# Patient Record
Sex: Female | Born: 1940 | Race: White | Hispanic: No | Smoking: Current every day smoker
Health system: Southern US, Community
[De-identification: ages and names within clinical notes are randomized; demographics above are authoritative.]

## PROBLEM LIST (undated history)

## (undated) DIAGNOSIS — K08109 Complete loss of teeth, unspecified cause, unspecified class: Secondary | ICD-10-CM

## (undated) DIAGNOSIS — J439 Emphysema, unspecified: Secondary | ICD-10-CM

## (undated) DIAGNOSIS — F32A Depression, unspecified: Secondary | ICD-10-CM

## (undated) DIAGNOSIS — I5022 Chronic systolic (congestive) heart failure: Secondary | ICD-10-CM

## (undated) DIAGNOSIS — M5135 Other intervertebral disc degeneration, thoracolumbar region: Secondary | ICD-10-CM

## (undated) DIAGNOSIS — K219 Gastro-esophageal reflux disease without esophagitis: Secondary | ICD-10-CM

## (undated) DIAGNOSIS — C679 Malignant neoplasm of bladder, unspecified: Secondary | ICD-10-CM

## (undated) DIAGNOSIS — I491 Atrial premature depolarization: Secondary | ICD-10-CM

## (undated) DIAGNOSIS — Z95810 Presence of automatic (implantable) cardiac defibrillator: Secondary | ICD-10-CM

## (undated) DIAGNOSIS — R350 Frequency of micturition: Secondary | ICD-10-CM

## (undated) DIAGNOSIS — R2681 Unsteadiness on feet: Secondary | ICD-10-CM

## (undated) DIAGNOSIS — I2510 Atherosclerotic heart disease of native coronary artery without angina pectoris: Secondary | ICD-10-CM

## (undated) DIAGNOSIS — E871 Hypo-osmolality and hyponatremia: Secondary | ICD-10-CM

## (undated) DIAGNOSIS — F419 Anxiety disorder, unspecified: Secondary | ICD-10-CM

## (undated) DIAGNOSIS — I10 Essential (primary) hypertension: Secondary | ICD-10-CM

## (undated) DIAGNOSIS — Z9689 Presence of other specified functional implants: Secondary | ICD-10-CM

## (undated) DIAGNOSIS — M545 Low back pain: Secondary | ICD-10-CM

## (undated) DIAGNOSIS — I447 Left bundle-branch block, unspecified: Secondary | ICD-10-CM

## (undated) DIAGNOSIS — G8929 Other chronic pain: Secondary | ICD-10-CM

## (undated) DIAGNOSIS — Z973 Presence of spectacles and contact lenses: Secondary | ICD-10-CM

## (undated) DIAGNOSIS — Z972 Presence of dental prosthetic device (complete) (partial): Secondary | ICD-10-CM

## (undated) DIAGNOSIS — M419 Scoliosis, unspecified: Secondary | ICD-10-CM

## (undated) DIAGNOSIS — I428 Other cardiomyopathies: Secondary | ICD-10-CM

## (undated) DIAGNOSIS — Z862 Personal history of diseases of the blood and blood-forming organs and certain disorders involving the immune mechanism: Secondary | ICD-10-CM

## (undated) DIAGNOSIS — Z8719 Personal history of other diseases of the digestive system: Secondary | ICD-10-CM

## (undated) DIAGNOSIS — R351 Nocturia: Secondary | ICD-10-CM

## (undated) DIAGNOSIS — M1990 Unspecified osteoarthritis, unspecified site: Secondary | ICD-10-CM

## (undated) HISTORY — PX: BIV PACEMAKER GENERATOR CHANGE OUT: SHX5746

## (undated) HISTORY — PX: LUMBAR DISC SURGERY: SHX700

## (undated) HISTORY — PX: TRANSTHORACIC ECHOCARDIOGRAM: SHX275

## (undated) HISTORY — PX: SPINAL CORD STIMULATOR IMPLANT: SHX2422

## (undated) HISTORY — PX: CATARACT EXTRACTION W/ INTRAOCULAR LENS  IMPLANT, BILATERAL: SHX1307

## (undated) HISTORY — PX: CARDIAC CATHETERIZATION: SHX172

---

## 1993-03-21 HISTORY — PX: TRANSURETHRAL RESECTION OF BLADDER: SUR1395

## 1998-12-28 LAB — HM COLONOSCOPY

## 2010-08-31 ENCOUNTER — Emergency Department (HOSPITAL_BASED_OUTPATIENT_CLINIC_OR_DEPARTMENT_OTHER)
Admission: EM | Admit: 2010-08-31 | Discharge: 2010-08-31 | Disposition: A | Discharge status: Other Acute Inpatient Hospital | Payer: Medicare Other | Source: Home / Self Care | Attending: Emergency Medicine | Admitting: Emergency Medicine

## 2010-08-31 ENCOUNTER — Emergency Department (INDEPENDENT_AMBULATORY_CARE_PROVIDER_SITE_OTHER): Payer: Medicare Other

## 2010-08-31 ENCOUNTER — Inpatient Hospital Stay (HOSPITAL_COMMUNITY)
Admission: EM | Admit: 2010-08-31 | Discharge: 2010-09-02 | DRG: 690 | Disposition: A | Discharge status: Home / Self Care | Payer: Medicare Other | Source: Other Acute Inpatient Hospital | Attending: Internal Medicine | Admitting: Internal Medicine

## 2010-08-31 DIAGNOSIS — R0789 Other chest pain: Secondary | ICD-10-CM | POA: Diagnosis present

## 2010-08-31 DIAGNOSIS — Z7982 Long term (current) use of aspirin: Secondary | ICD-10-CM

## 2010-08-31 DIAGNOSIS — E876 Hypokalemia: Secondary | ICD-10-CM | POA: Diagnosis not present

## 2010-08-31 DIAGNOSIS — A498 Other bacterial infections of unspecified site: Secondary | ICD-10-CM | POA: Diagnosis present

## 2010-08-31 DIAGNOSIS — N390 Urinary tract infection, site not specified: Principal | ICD-10-CM | POA: Diagnosis present

## 2010-08-31 DIAGNOSIS — M549 Dorsalgia, unspecified: Secondary | ICD-10-CM | POA: Diagnosis present

## 2010-08-31 DIAGNOSIS — E871 Hypo-osmolality and hyponatremia: Secondary | ICD-10-CM | POA: Diagnosis present

## 2010-08-31 DIAGNOSIS — K2970 Gastritis, unspecified, without bleeding: Secondary | ICD-10-CM | POA: Diagnosis present

## 2010-08-31 DIAGNOSIS — R079 Chest pain, unspecified: Secondary | ICD-10-CM

## 2010-08-31 DIAGNOSIS — F17200 Nicotine dependence, unspecified, uncomplicated: Secondary | ICD-10-CM | POA: Diagnosis present

## 2010-08-31 DIAGNOSIS — R32 Unspecified urinary incontinence: Secondary | ICD-10-CM | POA: Diagnosis present

## 2010-08-31 DIAGNOSIS — R002 Palpitations: Secondary | ICD-10-CM

## 2010-08-31 DIAGNOSIS — R030 Elevated blood-pressure reading, without diagnosis of hypertension: Secondary | ICD-10-CM | POA: Diagnosis present

## 2010-08-31 DIAGNOSIS — E869 Volume depletion, unspecified: Secondary | ICD-10-CM | POA: Diagnosis present

## 2010-08-31 DIAGNOSIS — H6690 Otitis media, unspecified, unspecified ear: Secondary | ICD-10-CM | POA: Diagnosis present

## 2010-08-31 DIAGNOSIS — R112 Nausea with vomiting, unspecified: Secondary | ICD-10-CM

## 2010-08-31 LAB — DIFFERENTIAL
Basophils Absolute: 0 10*3/uL (ref 0.0–0.1)
Basophils Relative: 0 % (ref 0–1)
Eosinophils Relative: 1 % (ref 0–5)
Monocytes Absolute: 0.9 10*3/uL (ref 0.1–1.0)

## 2010-08-31 LAB — URINALYSIS, ROUTINE W REFLEX MICROSCOPIC
Ketones, ur: NEGATIVE mg/dL
Nitrite: POSITIVE — AB
Protein, ur: NEGATIVE mg/dL
Urobilinogen, UA: 0.2 mg/dL (ref 0.0–1.0)
pH: 6 (ref 5.0–8.0)

## 2010-08-31 LAB — COMPREHENSIVE METABOLIC PANEL
Albumin: 4 g/dL (ref 3.5–5.2)
Alkaline Phosphatase: 94 U/L (ref 39–117)
BUN: 7 mg/dL (ref 6–23)
Calcium: 9.9 mg/dL (ref 8.4–10.5)
Potassium: 3.6 mEq/L (ref 3.5–5.1)
Sodium: 126 mEq/L — ABNORMAL LOW (ref 135–145)
Total Protein: 7.1 g/dL (ref 6.0–8.3)

## 2010-08-31 LAB — CBC
HCT: 40.2 % (ref 36.0–46.0)
MCHC: 35.6 g/dL (ref 30.0–36.0)
RDW: 12.8 % (ref 11.5–15.5)

## 2010-08-31 LAB — CK TOTAL AND CKMB (NOT AT ARMC)
Relative Index: 3.8 — ABNORMAL HIGH (ref 0.0–2.5)
Total CK: 146 U/L (ref 7–177)

## 2010-08-31 LAB — CARDIAC PANEL(CRET KIN+CKTOT+MB+TROPI)
CK, MB: 4.7 ng/mL — ABNORMAL HIGH (ref 0.3–4.0)
Relative Index: 4.4 — ABNORMAL HIGH (ref 0.0–2.5)
Total CK: 107 U/L (ref 7–177)
Troponin I: <0.3 ng/mL (ref <0.30)

## 2010-08-31 LAB — TROPONIN I: Troponin I: <0.3 ng/mL (ref <0.30)

## 2010-08-31 LAB — MRSA PCR SCREENING: MRSA by PCR: NEGATIVE

## 2010-08-31 LAB — LIPID PANEL: LDL Cholesterol: 110 mg/dL — ABNORMAL HIGH (ref 0–99)

## 2010-08-31 LAB — LIPASE, BLOOD: Lipase: 16 U/L (ref 11–59)

## 2010-09-01 DIAGNOSIS — R072 Precordial pain: Secondary | ICD-10-CM

## 2010-09-01 LAB — CBC
Hemoglobin: 13.3 g/dL (ref 12.0–15.0)
Platelets: 270 10*3/uL (ref 150–400)
RBC: 4.37 MIL/uL (ref 3.87–5.11)

## 2010-09-01 LAB — CARDIAC PANEL(CRET KIN+CKTOT+MB+TROPI)
CK, MB: 4.4 ng/mL — ABNORMAL HIGH (ref 0.3–4.0)
Relative Index: INVALID (ref 0.0–2.5)
Total CK: 94 U/L (ref 7–177)
Troponin I: <0.3 ng/mL (ref <0.30)

## 2010-09-01 LAB — HEMOGLOBIN A1C
Hgb A1c MFr Bld: 5.7 % — ABNORMAL HIGH (ref <5.7)
Mean Plasma Glucose: 117 mg/dL — ABNORMAL HIGH (ref <117)

## 2010-09-01 LAB — BASIC METABOLIC PANEL
CO2: 26 mEq/L (ref 19–32)
Calcium: 8.8 mg/dL (ref 8.4–10.5)
Creatinine, Ser: 0.52 mg/dL (ref 0.4–1.2)
GFR calc non Af Amer: >60 mL/min (ref >60)
Glucose, Bld: 105 mg/dL — ABNORMAL HIGH (ref 70–99)
Sodium: 130 mEq/L — ABNORMAL LOW (ref 135–145)

## 2010-09-02 LAB — CBC
MCH: 30.6 pg (ref 26.0–34.0)
MCHC: 33.8 g/dL (ref 30.0–36.0)
Platelets: 233 10*3/uL (ref 150–400)
RDW: 13.5 % (ref 11.5–15.5)

## 2010-09-02 LAB — URINE CULTURE: Culture  Setup Time: 201206121823

## 2010-09-02 LAB — BASIC METABOLIC PANEL
Calcium: 8.8 mg/dL (ref 8.4–10.5)
GFR calc Af Amer: >60 mL/min (ref >60)
GFR calc non Af Amer: >60 mL/min (ref >60)
Potassium: 3.4 mEq/L — ABNORMAL LOW (ref 3.5–5.1)
Sodium: 138 mEq/L (ref 135–145)

## 2010-09-02 NOTE — H&P (Signed)
[name], VOLKOV                 ACCOUNT NO.:  [id number]  MEDICAL RECORD NO.:  [id number]  LOCATION:  2920                         FACILITY:  [facility]  PHYSICIAN:  Hartley Barefoot, MD    DATE OF BIRTH:  Mar 17, 1941  DATE OF ADMISSION:  08/31/2010 DATE OF DISCHARGE:                             HISTORY & PHYSICAL   CHIEF COMPLAINT:  Nausea, weakness and chest pain.  BRIEF HISTORY OF PRESENT ILLNESS:  This is a very pleasant 70 year old with no significant past medical history, she has not followed with her primary care physician for a while.  She presented to [facility] complaining of nausea, weakness and chest pain.  She relates that she had a cold a couple of weeks ago.  She relates that 4 days prior to admission she had a decreased appetite, nausea.  She started to have some chest pain on and off in the middle of her chest radiating to the left side.  It lasts 5-15 minutes.  Not associated with shortness of breath or diaphoresis.  It is a sharp pain.  It is not associated with exertion.  She denies any chest pain or shortness of breath on ambulation.  During my evaluation, she was chest pain free.  She was still having some nausea.  She relates that she vomited 3-4 times 4 days prior to admission, Her chest pain started after multiple episodes of vomiting She denies dysuria, although she has a history of incontinence for a while now.  ALLERGIES:  No known drug allergy.  MEDICATIONS: 1. Tylenol 2. Baby aspirin. 3. Motrin.  She took a couple of Motrin couple of days ago.  REVIEW OF SYSTEMS:  Negative except as per HPI.  PAST MEDICAL HISTORY:  She has history of chronic back pain.  She has not followed with any physician over the last couple of years.  SOCIAL HISTORY:  She is a current smoker.  She smoked one and half pack per day for the last 50 years.  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  She has 4 daughters and 2 sons.  She denies alcohol, recreational  drugs.  FAMILY HISTORY:  Sister died of heart disease at 13 years old.  No history of diabetes.  PHYSICAL EXAM:  VITAL SIGNS:  Temperature 98.4, respirations 12, sat 100 on 2 L, blood pressure 156/90. GENERAL:  The patient is lying in bed in no acute distress. HEENT:  Head atraumatic, normocephalic.  Eyes anicteric.  Pupils are equal and reactive to light.  Extraocular muscle intact. CARDIOVASCULAR:  S1 and S2.  Regular rhythm and rate.  No rubs, murmurs or gallops. LUNGS:  Bilateral good air movement.  No wheezing.  Mild crackles and rhonchi. ABDOMEN:  Bowel sounds positive, soft.  Mild epigastric tenderness.  No rigidity.  No guarding. EXTREMITIES:  Pulse present.  No edema. NEURO:  Alert and oriented x3.  Crania nerves II-XII intact.  Sensation grossly intact.  Motor strength 5/5 throughout.  LABORATORY DATA:  White blood cell 9.6, hemoglobin 14.3, platelet 285, ANC 6.9.  Sodium 126, potassium 3.6, chloride 91, BUN 7, CO2 23, glucose 125, creatinine 0.47, bilirubin 0.4, alkaline phosphatase 94, AST 24,  ALT 13, albumin 4.0, CK-MB 5.5, total CK is 140, troponin 0.30.  UA; positive nitrate, white blood cell 11-20.  Chest x-ray, no active lung disease, slight hyperaeration.  ASSESSMENT AND PLAN:  This is a very pleasant 70 year old that presents with chest pain and nausea. 1. Chest pain, rule out acute coronary syndrome.  EKG; PVC, no ST     elevation.  First set of troponin negative.  I will avoid at this     time beta-blockers because her heart rate is in the 60.  I will     start aspirin, nitroglycerin p.r.n., hydralazine to control blood     pressure.  The patient at this time is chest pain free.  I will     check a 2-D echo, cardiac enzymes x3.  Risk stratify, fasting lipid     panel, hemoglobin A1c.  We will start simvastatin.  We will just     check a lipase level.  Further recommendation depending on cardiac     enzymes, EKG and 2-D echo. 2. Nausea could be due to  gastritis, I will check a lipase to rule out     pancreatitis.  I will start Protonix.  Zofran p.r.n.  Nausea and     vomiting could be secondary to UTI, may be pyelo versus gastritis. 3. Hypertension.  Hydralazine p.r.n.  Monitor closely.  We will need     to start oral medication if blood pressure continued to be     elevated. 4. Urinary tract infection, questionable pyelo.  The patient presented     with nausea and vomiting.  No significant elevation of white blood     cell count.  She has some urinary incontinence that appears     chronic.  I will continue with ceftriaxone.  We will follow urine     cultures. 5. Hyponatremia in the setting of dehydration, poor oral intake,     nausea and vomiting.  We will continue with IV fluid, normal saline     100 mL per hour.  Repeat sodium in the morning. 6. Deep vein thrombosis.  SCDs at this time.     Hartley Barefoot, MD     BR/MEDQ  D:  08/31/2010  T:  09/01/2010  Job:  [id number]  Electronically Signed by Hartley Barefoot MD on 09/02/2010 08:15:11 PM

## 2010-09-06 LAB — CULTURE, BLOOD (ROUTINE X 2)
Culture  Setup Time: 201206122241
Culture: NO GROWTH

## 2010-09-15 NOTE — Discharge Summary (Signed)
NAMEAUDRYNA, [name]                 ACCOUNT NO.:  [id number]  MEDICAL RECORD NO.:  [id number]  LOCATION:  2018                         FACILITY:  [facility]  PHYSICIAN:  Kela Millin, M.D.DATE OF BIRTH:  [birth year]  DATE OF ADMISSION:  08/31/2010 DATE OF DISCHARGE:  09/02/2010                        DISCHARGE SUMMARY - REFERRING   DISCHARGE DIAGNOSIS: 1. Escherichia coli urinary tract infection. 2. Chest pain-rule out MI by cardiac enzymes, likely GI related. 3. Elevated blood pressures-on admission, but normotensive off of     medications during hospitalization, follow up outpatient. 4. Volume depletion/hyponatremia-resolved. 5. Tobacco abuse-patient counseled to quit.  PROCEDURES AND STUDIES: 1. Chest x-ray on August 31, 2010 - no active lung disease, slight     hyperaeration. 2  A 2-D echocardiogram on September 01, 2010 - ejection fraction 55%-60% wall motion normal.  No regional wall motion abnormalities.  No aortic valve stenosis, mild diastolic dysfunction.  BRIEF HISTORY:  The patient is a very pleasant 70 year old white female with no significant past medical history who presented with complaints of nausea, weakness, and chest pain.  She reported that about 4 days prior to admission she had decreased appetite and nausea and stated that she began having chest pain on and off midsternal/epigastric radiating to her left side and lasted for 5-15 minutes.  She denied shortness of breath and had no diaphoresis.  The pain was described as sharp and not associated with exertion.  She reported that she vomited 3-4 times in the 4 days prior to admission.  She denied dysuria, although she did admit that she had been having a urinary incontinence for sometime.  She was admitted for further evaluation and management.  HOSPITAL COURSE: 1. E. coli urinary tract infection-upon admission the patient had an     urinalysis which was consistent with UTI and urine cultures were     done and  the patient was started on empiric antibiotics.  The urine     cultures grew E. coli that was pansensitive.  The patient has not     had any nausea or vomiting, has remained afebrile and her last     white cell count prior to discharge 7.6.  She will be discharged on     oral antibiotics to complete the antibiotic course. 2. Chest pain-the patient had cardiac enzymes cycled and it came back     negative.  A 2-D echocardiogram was done and the results as stated     above.  The patient's chest pain resolved on PPI.  It was noted     that it was atypical and that she had been on NSAIDS prior to     admission and her symptoms were more likely GI related.  She has     been discharged on Prilosec.  She is to follow up with her primary     care physician for further monitoring and management as     appropriate.  As part of her cardiac workup the patient also had a     fasting lipid profile done and her total cholesterol came back at     175 and HDL of 52, LDL of 110.  She does  not require any further     Zocor at this time and is to follow up with her primary care     physician. 3. Elevated blood pressure-it was noted that one of the patient's     blood pressures in the ED was elevated at 161/89, on monitoring in     the hospital this elevation was not sustained and she has been     normotensive, and states that she did not have a prior history of     hypertension.  She is to follow up with her primary care physician     for further monitoring and management as appropriate. 4. Tobacco abuse-the patient was counseled to quit tobacco. 5. Volume depletion/hyponatremia-impression was that this was     secondary to her nausea and vomiting and with hydration this     resolved.  Her sodium today prior to discharge is 138. 6. Hypokalemia-Her potassium was replaced in the hospital.  DISCHARGE MEDICATIONS: 1. Ceftin 500 mg one p.o. b.i.d. 2. Prilosec 40 mg one p.o. b.i.d. 3. Aspirin 81 mg as  previously. 4. Tylenol p.r.n.  DISCONTINUED MEDICATIONS:  Motrin.  FOLLOWUP CARE:  Dr. Annitta Needs in 1-2 weeks, call for appointment.  DISCHARGE CONDITION:  Improved/stable.     Kela Millin, M.D.     ACV/MEDQ  D:  09/02/2010  T:  09/02/2010  Job:  [id number]  Electronically Signed by Donnalee Curry M.D. on 09/15/2010 07:28:36 AM

## 2013-11-19 DIAGNOSIS — Z8711 Personal history of peptic ulcer disease: Secondary | ICD-10-CM

## 2013-11-19 DIAGNOSIS — Z862 Personal history of diseases of the blood and blood-forming organs and certain disorders involving the immune mechanism: Secondary | ICD-10-CM

## 2013-11-19 HISTORY — DX: Personal history of diseases of the blood and blood-forming organs and certain disorders involving the immune mechanism: Z86.2

## 2013-11-19 HISTORY — DX: Personal history of peptic ulcer disease: Z87.11

## 2013-11-22 ENCOUNTER — Inpatient Hospital Stay (HOSPITAL_COMMUNITY)
Admission: EM | Admit: 2013-11-22 | Discharge: 2013-11-29 | DRG: 687 | Disposition: A | Discharge status: Home / Self Care | Payer: Medicare Other | Attending: Internal Medicine | Admitting: Internal Medicine

## 2013-11-22 ENCOUNTER — Encounter (HOSPITAL_COMMUNITY): Payer: Self-pay | Admitting: Emergency Medicine

## 2013-11-22 ENCOUNTER — Emergency Department (HOSPITAL_COMMUNITY): Payer: Medicare Other

## 2013-11-22 DIAGNOSIS — I4891 Unspecified atrial fibrillation: Secondary | ICD-10-CM | POA: Diagnosis present

## 2013-11-22 DIAGNOSIS — I499 Cardiac arrhythmia, unspecified: Secondary | ICD-10-CM | POA: Diagnosis not present

## 2013-11-22 DIAGNOSIS — R079 Chest pain, unspecified: Secondary | ICD-10-CM

## 2013-11-22 DIAGNOSIS — K2990 Gastroduodenitis, unspecified, without bleeding: Secondary | ICD-10-CM | POA: Diagnosis not present

## 2013-11-22 DIAGNOSIS — I447 Left bundle-branch block, unspecified: Secondary | ICD-10-CM | POA: Diagnosis present

## 2013-11-22 DIAGNOSIS — I4892 Unspecified atrial flutter: Secondary | ICD-10-CM | POA: Diagnosis not present

## 2013-11-22 DIAGNOSIS — D509 Iron deficiency anemia, unspecified: Secondary | ICD-10-CM | POA: Diagnosis present

## 2013-11-22 DIAGNOSIS — R112 Nausea with vomiting, unspecified: Secondary | ICD-10-CM | POA: Diagnosis not present

## 2013-11-22 DIAGNOSIS — Z681 Body mass index (BMI) 19 or less, adult: Secondary | ICD-10-CM | POA: Diagnosis not present

## 2013-11-22 DIAGNOSIS — Z01810 Encounter for preprocedural cardiovascular examination: Secondary | ICD-10-CM

## 2013-11-22 DIAGNOSIS — E871 Hypo-osmolality and hyponatremia: Secondary | ICD-10-CM

## 2013-11-22 DIAGNOSIS — Z8551 Personal history of malignant neoplasm of bladder: Secondary | ICD-10-CM | POA: Diagnosis not present

## 2013-11-22 DIAGNOSIS — Z8719 Personal history of other diseases of the digestive system: Secondary | ICD-10-CM | POA: Diagnosis present

## 2013-11-22 DIAGNOSIS — E861 Hypovolemia: Secondary | ICD-10-CM | POA: Diagnosis present

## 2013-11-22 DIAGNOSIS — R1013 Epigastric pain: Secondary | ICD-10-CM | POA: Diagnosis not present

## 2013-11-22 DIAGNOSIS — K259 Gastric ulcer, unspecified as acute or chronic, without hemorrhage or perforation: Secondary | ICD-10-CM | POA: Diagnosis present

## 2013-11-22 DIAGNOSIS — F17200 Nicotine dependence, unspecified, uncomplicated: Secondary | ICD-10-CM | POA: Diagnosis present

## 2013-11-22 DIAGNOSIS — R319 Hematuria, unspecified: Secondary | ICD-10-CM

## 2013-11-22 DIAGNOSIS — N390 Urinary tract infection, site not specified: Secondary | ICD-10-CM | POA: Diagnosis present

## 2013-11-22 DIAGNOSIS — R0609 Other forms of dyspnea: Secondary | ICD-10-CM | POA: Diagnosis not present

## 2013-11-22 DIAGNOSIS — I428 Other cardiomyopathies: Secondary | ICD-10-CM | POA: Diagnosis present

## 2013-11-22 DIAGNOSIS — R002 Palpitations: Secondary | ICD-10-CM

## 2013-11-22 DIAGNOSIS — M5137 Other intervertebral disc degeneration, lumbosacral region: Secondary | ICD-10-CM | POA: Diagnosis not present

## 2013-11-22 DIAGNOSIS — C679 Malignant neoplasm of bladder, unspecified: Principal | ICD-10-CM

## 2013-11-22 DIAGNOSIS — I452 Bifascicular block: Secondary | ICD-10-CM | POA: Diagnosis present

## 2013-11-22 DIAGNOSIS — K2970 Gastritis, unspecified, without bleeding: Secondary | ICD-10-CM | POA: Diagnosis not present

## 2013-11-22 DIAGNOSIS — R933 Abnormal findings on diagnostic imaging of other parts of digestive tract: Secondary | ICD-10-CM | POA: Diagnosis not present

## 2013-11-22 DIAGNOSIS — N329 Bladder disorder, unspecified: Secondary | ICD-10-CM | POA: Diagnosis not present

## 2013-11-22 DIAGNOSIS — I319 Disease of pericardium, unspecified: Secondary | ICD-10-CM | POA: Diagnosis not present

## 2013-11-22 DIAGNOSIS — I509 Heart failure, unspecified: Secondary | ICD-10-CM | POA: Diagnosis present

## 2013-11-22 DIAGNOSIS — R072 Precordial pain: Secondary | ICD-10-CM | POA: Diagnosis not present

## 2013-11-22 DIAGNOSIS — Z8711 Personal history of peptic ulcer disease: Secondary | ICD-10-CM | POA: Diagnosis present

## 2013-11-22 DIAGNOSIS — D62 Acute posthemorrhagic anemia: Secondary | ICD-10-CM | POA: Diagnosis present

## 2013-11-22 DIAGNOSIS — J449 Chronic obstructive pulmonary disease, unspecified: Secondary | ICD-10-CM | POA: Diagnosis not present

## 2013-11-22 DIAGNOSIS — D414 Neoplasm of uncertain behavior of bladder: Secondary | ICD-10-CM | POA: Diagnosis not present

## 2013-11-22 DIAGNOSIS — R109 Unspecified abdominal pain: Secondary | ICD-10-CM | POA: Diagnosis not present

## 2013-11-22 DIAGNOSIS — R310 Gross hematuria: Secondary | ICD-10-CM | POA: Diagnosis not present

## 2013-11-22 DIAGNOSIS — I491 Atrial premature depolarization: Secondary | ICD-10-CM

## 2013-11-22 DIAGNOSIS — I420 Dilated cardiomyopathy: Secondary | ICD-10-CM | POA: Diagnosis present

## 2013-11-22 DIAGNOSIS — K257 Chronic gastric ulcer without hemorrhage or perforation: Secondary | ICD-10-CM | POA: Diagnosis not present

## 2013-11-22 HISTORY — DX: Atrial premature depolarization: I49.1

## 2013-11-22 HISTORY — DX: Left bundle-branch block, unspecified: I44.7

## 2013-11-22 HISTORY — DX: Malignant neoplasm of bladder, unspecified: C67.9

## 2013-11-22 LAB — CBC
HEMATOCRIT: 28.7 % — AB (ref 36.0–46.0)
HEMOGLOBIN: 9.2 g/dL — AB (ref 12.0–15.0)
MCH: 20.4 pg — ABNORMAL LOW (ref 26.0–34.0)
MCHC: 32.1 g/dL (ref 30.0–36.0)
MCV: 63.6 fL — AB (ref 78.0–100.0)
Platelets: 322 10*3/uL (ref 150–400)
RBC: 4.51 MIL/uL (ref 3.87–5.11)
RDW: 17.8 % — ABNORMAL HIGH (ref 11.5–15.5)
WBC: 7.2 10*3/uL (ref 4.0–10.5)

## 2013-11-22 LAB — URINALYSIS, ROUTINE W REFLEX MICROSCOPIC
BILIRUBIN URINE: NEGATIVE
GLUCOSE, UA: NEGATIVE mg/dL
KETONES UR: NEGATIVE mg/dL
LEUKOCYTES UA: NEGATIVE
NITRITE: NEGATIVE
Protein, ur: NEGATIVE mg/dL
SPECIFIC GRAVITY, URINE: 1.004 — AB (ref 1.005–1.030)
Urobilinogen, UA: 0.2 mg/dL (ref 0.0–1.0)
pH: 7.5 (ref 5.0–8.0)

## 2013-11-22 LAB — COMPREHENSIVE METABOLIC PANEL
ALK PHOS: 63 U/L (ref 39–117)
ALT: 14 U/L (ref 0–35)
ANION GAP: 15 (ref 5–15)
AST: 26 U/L (ref 0–37)
Albumin: 3.7 g/dL (ref 3.5–5.2)
BILIRUBIN TOTAL: 0.4 mg/dL (ref 0.3–1.2)
BUN: 11 mg/dL (ref 6–23)
CHLORIDE: 83 meq/L — AB (ref 96–112)
CO2: 25 meq/L (ref 19–32)
CREATININE: 0.66 mg/dL (ref 0.50–1.10)
Calcium: 10 mg/dL (ref 8.4–10.5)
GFR calc Af Amer: >90 mL/min (ref >90)
GFR, EST NON AFRICAN AMERICAN: 85 mL/min — AB (ref >90)
GLUCOSE: 111 mg/dL — AB (ref 70–99)
POTASSIUM: 3.7 meq/L (ref 3.7–5.3)
Sodium: 123 mEq/L — ABNORMAL LOW (ref 137–147)
Total Protein: 6.9 g/dL (ref 6.0–8.3)

## 2013-11-22 LAB — POC OCCULT BLOOD, ED: FECAL OCCULT BLD: NEGATIVE

## 2013-11-22 LAB — PROTIME-INR
INR: 0.98 (ref 0.00–1.49)
Prothrombin Time: 13 seconds (ref 11.6–15.2)

## 2013-11-22 LAB — URINE MICROSCOPIC-ADD ON

## 2013-11-22 LAB — APTT: APTT: 27 s (ref 24–37)

## 2013-11-22 LAB — I-STAT TROPONIN, ED: TROPONIN I, POC: 0.02 ng/mL (ref 0.00–0.08)

## 2013-11-22 MED ORDER — ACETAMINOPHEN 325 MG PO TABS
650.0000 mg | ORAL_TABLET | Freq: Once | ORAL | Status: AC
  Administered 2013-11-22: 650 mg via ORAL
  Filled 2013-11-22: qty 2

## 2013-11-22 MED ORDER — SODIUM CHLORIDE 0.9 % IV SOLN
INTRAVENOUS | Status: DC
  Administered 2013-11-22: 17:00:00 via INTRAVENOUS

## 2013-11-22 MED ORDER — ONDANSETRON 4 MG PO TBDP
4.0000 mg | ORAL_TABLET | Freq: Once | ORAL | Status: AC
  Administered 2013-11-22: 4 mg via ORAL
  Filled 2013-11-22: qty 1

## 2013-11-22 MED ORDER — SUCRALFATE 1 GM/10ML PO SUSP
1.0000 g | Freq: Four times a day (QID) | ORAL | Status: DC
  Administered 2013-11-23 – 2013-11-29 (×20): 1 g via ORAL
  Filled 2013-11-22 (×31): qty 10

## 2013-11-22 MED ORDER — SODIUM CHLORIDE 0.9 % IV BOLUS (SEPSIS)
500.0000 mL | Freq: Once | INTRAVENOUS | Status: AC
  Administered 2013-11-22: 500 mL via INTRAVENOUS

## 2013-11-22 MED ORDER — GI COCKTAIL ~~LOC~~
30.0000 mL | Freq: Once | ORAL | Status: AC
  Administered 2013-11-22: 30 mL via ORAL
  Filled 2013-11-22: qty 30

## 2013-11-22 MED ORDER — NICOTINE 14 MG/24HR TD PT24
14.0000 mg | MEDICATED_PATCH | Freq: Every day | TRANSDERMAL | Status: DC
  Administered 2013-11-23 – 2013-11-29 (×8): 14 mg via TRANSDERMAL
  Filled 2013-11-22 (×8): qty 1

## 2013-11-22 MED ORDER — ASPIRIN 81 MG PO CHEW
324.0000 mg | CHEWABLE_TABLET | Freq: Once | ORAL | Status: AC
  Administered 2013-11-22: 324 mg via ORAL
  Filled 2013-11-22: qty 4

## 2013-11-22 NOTE — Consult Note (Signed)
CARDIOLOGY CONSULT NOTE   Patient ID: [name] MRN: [id number], DOB/AGE: 05-29-1940   Admit date: 11/22/2013 Date of Consult: 11/22/2013  Primary Physician: PROVIDER NOT Hugo Primary Cardiologist: None  Reason for consult:  Chest pain, arrhythmia  Problem List  Past Medical History  Diagnosis Date  . Cancer   . Bladder cancer 1995    Past Surgical History  Procedure Laterality Date  . Back surgery       Allergies  No Known Allergies  HPI   A pleasant 73 year old female with h/o bladder cancer, no prior cardiac history who presented with epigastric pain, nausea, vomiting , inability to eat in the last 2 days. Epigastric pain started about a week ago and was relieved by Rolaids, but got progressively worse. Today she felt really weak and dizzy and unable to keep anything down. She denies any chest pain, SOB, palpitations, LE edema, orthopnea or PND. No syncope. The patient is an ongoing smoker.  Inpatient Medications     Family History History reviewed. No pertinent family history.   Social History History   Social History  . Marital Status: Widowed    Spouse Name: N/A    Number of Children: N/A  . Years of Education: N/A   Occupational History  . Not on file.   Social History Main Topics  . Smoking status: Current Every Day Smoker -- 0.50 packs/day  . Smokeless tobacco: Not on file  . Alcohol Use: No  . Drug Use: Not on file  . Sexual Activity: Not on file   Other Topics Concern  . Not on file   Social History Narrative  . No narrative on file     Review of Systems  General:  No chills, fever, night sweats or weight changes.  Cardiovascular:  No chest pain, dyspnea on exertion, edema, orthopnea, palpitations, paroxysmal nocturnal dyspnea. Dermatological: No rash, lesions/masses Respiratory: No cough, dyspnea Urologic: No hematuria, dysuria Abdominal:   No nausea, vomiting, diarrhea, bright red blood per rectum, melena, or  hematemesis Neurologic:  No visual changes, wkns, changes in mental status. All other systems reviewed and are otherwise negative except as noted above.  Physical Exam  Blood pressure 136/91, pulse 87, temperature 98 F (36.7 C), temperature source Oral, resp. rate 20, SpO2 99.00%.  General: Pleasant, NAD Psych: Normal affect. Neuro: Alert and oriented X 3. Moves all extremities spontaneously. HEENT: Normal  Neck: Supple without bruits or JVD. Lungs:  Resp regular and unlabored, CTA. Heart: RRR no s3, s4, or murmurs. Abdomen: Soft, non-tender, non-distended, BS + x 4.  Extremities: No clubbing, cyanosis or edema. DP/PT/Radials 2+ and equal bilaterally.  Labs  No results found for this basename: CKTOTAL, CKMB, TROPONINI,  in the last 72 hours Lab Results  Component Value Date   WBC 7.2 11/22/2013   HGB 9.2* 11/22/2013   HCT 28.7* 11/22/2013   MCV 63.6* 11/22/2013   PLT 322 11/22/2013    Recent Labs Lab 11/22/13 1653  NA 123*  K 3.7  CL 83*  CO2 25  BUN 11  CREATININE 0.66  CALCIUM 10.0  PROT 6.9  BILITOT 0.4  ALKPHOS 63  ALT 14  AST 26  GLUCOSE 111*   Lab Results  Component Value Date   CHOL 175 08/31/2010   HDL 52 08/31/2010   LDLCALC 110* 08/31/2010   TRIG 67 08/31/2010   No results found for this basename: DDIMER   No components found with this basename: POCBNP,   Radiology/Studies  Dg Chest 2 View  11/22/2013   CLINICAL DATA:  Nausea and vomiting.  Sternal and epigastric pain.  EXAM: CHEST - 2 VIEW  COMPARISON:  08/31/2010  FINDINGS: The heart size and mediastinal contours are within normal limits. Stable COPD with mild bilateral pulmonary hyperinflation. There is no evidence of pulmonary edema, consolidation, pneumothorax, nodule or pleural fluid. The visualized skeletal structures are unremarkable.  IMPRESSION: Stable COPD.  No active disease.   Electronically Signed   By: Aletta Edouard M.D.   On: 11/22/2013 19:03    Echocardiogram - none  ECG: Sinus  tachycardia, short runs of atrial fibrillation, LBBB    ASSESSMENT AND PLAN  73 year old female  1. Atypical chest pain, new LBBB and frequent PACs and short runs of a-fib.  The pain is rather an epigastric pain in the settings of significant microcytic anemia. Anemia new when compared to 2012.   The patient also has significant hyponatremia that is most probably a consequence of no PO intake and vomiting in two days.   The patient needs rehydration and GI evaluation for possible upper GI bleeding.  I would restrain from any further ischemia work up at this point. Once her electrolyte disbalance and anemia are resolved and she continues to have BBB and ectopy we would consider outpatient stress test.  Based on urinalysis she also has UTI that might be contributing to her symptoms.   Signed, [name], MD, [facility] 11/22/2013, 9:55 PM

## 2013-11-22 NOTE — ED Provider Notes (Signed)
CSN: [id number]     Arrival date & time 11/22/13  1637 History   None    Chief Complaint  Patient presents with  . Chest Pain     (Consider location/radiation/quality/duration/timing/severity/associated sxs/prior Treatment) HPI Pt presenting with c/o generalized weakness and palpitations.  She states the symptoms have been ongoing for several days but worse today.  She also c/o nausea and one episode of emesis, nonbloody and nonbilious.  Denies chest pain but states she feels a pressure in her upper stomach.  No leg swelling.  No fainting or lightheadedness.  No difficulty breathing. Pt was treated with zofran by EMS.  There are no other associated systemic symptoms, there are no other alleviating or modifying factors.   Past Medical History  Diagnosis Date  . Cancer   . Bladder cancer 1995   Past Surgical History  Procedure Laterality Date  . Back surgery     History reviewed. No pertinent family history. History  Substance Use Topics  . Smoking status: Current Every Day Smoker -- 0.50 packs/day  . Smokeless tobacco: Not on file  . Alcohol Use: No   OB History   Grav Para Term Preterm Abortions TAB SAB Ect Mult Living                 Review of Systems ROS reviewed and all otherwise negative except for mentioned in HPI    Allergies  Review of patient's allergies indicates no known allergies.  Home Medications   Prior to Admission medications   Medication Sig Start Date End Date Taking? Authorizing Provider  acetaminophen (TYLENOL) 500 MG tablet Take 500 mg by mouth every 8 (eight) hours as needed for moderate pain.   Yes Historical Provider, MD  Ca Carbonate-Mag Hydroxide (ROLAIDS) 550-110 MG CHEW Chew 1 tablet by mouth 2 (two) times daily as needed (for heartburn).   Yes Historical Provider, MD   BP 136/91  Pulse 87  Temp(Src) 98 F (36.7 C) (Oral)  Resp 20  SpO2 99% Vitals reviewed Physical Exam Physical Examination: General appearance - alert, well  appearing, and in no distress Mental status - alert, oriented to person, place, and time Eyes - no conjunctival injection, no scleral icterus Mouth - mucous membranes moist, pharynx normal without lesions Chest - clear to auscultation, no wheezes, rales or rhonchi, symmetric air entry Heart - normal rate, regular rhythm, normal S1, S2, no murmurs, rubs, clicks or gallops Abdomen - soft, nontender, nondistended, no masses or organomegaly Extremities - peripheral pulses normal, no pedal edema, no clubbing or cyanosis Skin - normal coloration and turgor, no rashes  ED Course  Procedures (including critical care time)  4:48 PM paging interventional cardiology now.  EKG repeated x 2, new IVCD with left axis deviation.  Pt not having any chest pain but she does c/o nausea and palpiations.    4:51 PM d/w Dr. Martinique, he has reviewed EKG and agrees that this does not represent a stemi. Will proceed with workup in the ED  7:40 PM d/w cardiology, Dr. Meda Coffee, she will see patient in the ED.   8:02 PM cardiology has seen patient, recommends hospitalist admission, recommends UA, correction of electrolytes.  Will check hemocult due to microcytic anemia.  Cardiology recommends echo and they will consult.  Labs Review Labs Reviewed  CBC - Abnormal; Notable for the following:    Hemoglobin 9.2 (*)    HCT 28.7 (*)    MCV 63.6 (*)    MCH 20.4 (*)  RDW 17.8 (*)    All other components within normal limits  COMPREHENSIVE METABOLIC PANEL - Abnormal; Notable for the following:    Sodium 123 (*)    Chloride 83 (*)    Glucose, Bld 111 (*)    GFR calc non Af Amer 85 (*)    All other components within normal limits  URINALYSIS, ROUTINE W REFLEX MICROSCOPIC - Abnormal; Notable for the following:    Specific Gravity, Urine 1.004 (*)    Hgb urine dipstick MODERATE (*)    All other components within normal limits  URINE MICROSCOPIC-ADD ON - Abnormal; Notable for the following:    Squamous Epithelial / LPF  FEW (*)    Bacteria, UA FEW (*)    All other components within normal limits  APTT  PROTIME-INR  TROPONIN I  TROPONIN I  TROPONIN I  I-STAT TROPOININ, ED  POC OCCULT BLOOD, ED    Imaging Review Dg Chest 2 View  11/22/2013   CLINICAL DATA:  Nausea and vomiting.  Sternal and epigastric pain.  EXAM: CHEST - 2 VIEW  COMPARISON:  08/31/2010  FINDINGS: The heart size and mediastinal contours are within normal limits. Stable COPD with mild bilateral pulmonary hyperinflation. There is no evidence of pulmonary edema, consolidation, pneumothorax, nodule or pleural fluid. The visualized skeletal structures are unremarkable.  IMPRESSION: Stable COPD.  No active disease.   Electronically Signed   By: Aletta Edouard M.D.   On: 11/22/2013 19:03     EKG Interpretation   Date/Time:  Friday November 22 2013 16:45:28 EDT Ventricular Rate:  119 PR Interval:  168 QRS Duration: 138 QT Interval:  412 QTC Calculation: 580 R Axis:   -74 Text Interpretation:  Atrial flutter Nonspecific IVCD with LAD LVH with  secondary repolarization abnormality Anterior ST elevation, probably due  to LVH Probable RV involvement, suggest recording right precordial leads  Since previous tracing conduction delay and ectopy are new Confirmed by  [facility]  MD, Kendalyn Cranfield [phone]) on 11/22/2013 9:21:21 PM      MDM   Final diagnoses:  Hyponatremia  Chest pain, unspecified chest pain type  Epigastric pain  Microcytic anemia  Palpitations    Pt presenting with c/o palpitations, nausea.  Initial EKG showed ectopy and IVCD- these changes were new since prior in 2012- d/w interventionalist who reviewed EKG and agreed no stemi.  Cardiology consult obtained and recommends hospitalist admission for hyponatremia, anemia and feels her ectopy and conduction will improve when these are treated.  Cardiology also plans for echo.  D/w triad for admission to telemetry bed. Stool hemocult negative.   There are no other associated systemic  symptoms, there are no other alleviating or modifying factors.     Threasa Beards, MD 11/22/13 2245

## 2013-11-22 NOTE — H&P (Signed)
[facility] Admission History and Physical       [name] YNW:[id number] DOB: Oct 31, 1940 DOA: 11/22/2013  Referring physician:  EDP PCP: PROVIDER NOT IN SYSTEM  Specialists:   Chief Complaint: Nausea and Vomiting Chest and Epigastric Pain and Palpitations  HPI: [name] is a 73 y.o. female with a remote history of bladder cancer (1995) who presents to the ED with complaints of nausea and vomiting x 2 days with substernal chest pain and epigastric pain.  She denies any diarrhea, or hematemesis.   She reports that she has had palpitations a feeling as if her heart was pounding.   She reports that she had nausea and vomiting for 2 days last week and thought it was due to food poisoning.       Review of Systems:  Constitutional: No Weight Loss, No Weight Gain, Night Sweats, Fevers, Chills, Dizziness, Fatigue, or Generalized Weakness HEENT: No Headaches, Difficulty Swallowing,Tooth/Dental Problems,Sore Throat,  No Sneezing, Rhinitis, Ear Ache, Nasal Congestion, or Post Nasal Drip,  Cardio-vascular:  +Chest pain, Orthopnea, PND, Edema in Lower Extremities, Anasarca, Dizziness, +Palpitations  Resp: No Dyspnea, No DOE, No Productive Cough, No Non-Productive Cough, No Hemoptysis, No Wheezing.    GI: No Heartburn, Indigestion, +Epigastric Abdominal Pain, +Nausea, +Vomiting, No Diarrhea, Hematemesis, Hematochezia, Melena, Change in Bowel Habits,  Loss of Appetite  GU: No Dysuria, Change in Color of Urine, No Urgency or Frequency, No Flank pain.  Musculoskeletal: No Joint Pain or Swelling, No Decreased Range of Motion, +Back Pain.  Neurologic: No Syncope, No Seizures, Muscle Weakness, Paresthesia, Vision Disturbance or Loss, No Diplopia, No Vertigo, No Difficulty Walking,  Skin: No Rash or Lesions. Psych: No Change in Mood or Affect, No Depression or Anxiety, No Memory loss, No Confusion, or Hallucinations   Past Medical History  Diagnosis Date  . Cancer   . Bladder cancer 1995        Past Surgical History  Procedure Laterality Date  . Back surgery        Prior to Admission medications   Medication Sig Start Date End Date Taking? Authorizing Provider  acetaminophen (TYLENOL) 500 MG tablet Take 500 mg by mouth every 8 (eight) hours as needed for moderate pain.   Yes Historical Provider, MD  Ca Carbonate-Mag Hydroxide (ROLAIDS) 550-110 MG CHEW Chew 1 tablet by mouth 2 (two) times daily as needed (for heartburn).   Yes Historical Provider, MD     No Known Allergies   Social History:     +Tobacco:  1.5 ppd   No ETOH   No Illicit Drug Usage    History reviewed. No pertinent family history.     Physical Exam:  GEN:  Pleasant Thin Elderly  73 y.o. Caucasian female examined and in no acute distress; cooperative with exam Filed Vitals:   11/22/13 2030 11/22/13 2045 11/22/13 2100 11/22/13 2137  BP: 137/79 119/93 136/91   Pulse: 47 81 92 87  Temp:      TempSrc:      Resp: 18 16 19 20   SpO2: 100% 98% 99%    Blood pressure 136/91, pulse 87, temperature 98 F (36.7 C), temperature source Oral, resp. rate 20, SpO2 99.00%. PSYCH: She is alert and oriented x4; does not appear anxious does not appear depressed; affect is normal HEENT: Normocephalic and Atraumatic, Mucous membranes pink; PERRLA; EOM intact; Fundi:  Benign;  No scleral icterus, Nares: Patent, Oropharynx: Clear, Edentulous with Dentures present,    Neck:  FROM, No Cervical Lymphadenopathy nor  Thyromegaly or Carotid Bruit; No JVD; Breasts:: Not examined CHEST WALL: No tenderness CHEST: Normal respiration, clear to auscultation bilaterally HEART: Regular rate and rhythm; no murmurs rubs or gallops BACK: No kyphosis or scoliosis; No CVA tenderness ABDOMEN: Positive Bowel Sounds, Scaphoid, Soft Non-Tender; No Masses, No Organomegaly Rectal Exam: Not done EXTREMITIES: No Cyanosis, Clubbing, or Edema; No Ulcerations. Genitalia: not examined PULSES: 2+ and symmetric SKIN: Normal hydration no  rash or ulceration CNS:  Alert and Oriented x 4, No focal Deficits Vascular: pulses palpable throughout    Labs on Admission:  Basic Metabolic Panel:  Recent Labs Lab 11/22/13 1653  NA 123*  K 3.7  CL 83*  CO2 25  GLUCOSE 111*  BUN 11  CREATININE 0.66  CALCIUM 10.0   Liver Function Tests:  Recent Labs Lab 11/22/13 1653  AST 26  ALT 14  ALKPHOS 63  BILITOT 0.4  PROT 6.9  ALBUMIN 3.7   No results found for this basename: LIPASE, AMYLASE,  in the last 168 hours No results found for this basename: AMMONIA,  in the last 168 hours CBC:  Recent Labs Lab 11/22/13 1653  WBC 7.2  HGB 9.2*  HCT 28.7*  MCV 63.6*  PLT 322   Cardiac Enzymes: No results found for this basename: CKTOTAL, CKMB, CKMBINDEX, TROPONINI,  in the last 168 hours  BNP (last 3 results) No results found for this basename: PROBNP,  in the last 8760 hours CBG: No results found for this basename: GLUCAP,  in the last 168 hours  Radiological Exams on Admission: Dg Chest 2 View  11/22/2013   CLINICAL DATA:  Nausea and vomiting.  Sternal and epigastric pain.  EXAM: CHEST - 2 VIEW  COMPARISON:  08/31/2010  FINDINGS: The heart size and mediastinal contours are within normal limits. Stable COPD with mild bilateral pulmonary hyperinflation. There is no evidence of pulmonary edema, consolidation, pneumothorax, nodule or pleural fluid. The visualized skeletal structures are unremarkable.  IMPRESSION: Stable COPD.  No active disease.   Electronically Signed   By: Aletta Edouard M.D.   On: 11/22/2013 19:03     EKG: Independently reviewed. Sinus Tachycardia at 101, No Acute S-T changes   Assessment/Plan:   73 y.o. female with   Principal Problem:   1.   Hyponatremia- due to N+V +/- SIADH   Sent Urine Electrolytes, and Osms to eval for SIADH   IVFs with NSS to rehydrate   Monitor Na+Levels   Active Problems:   2.   Palpitations- due to #1   Telemetry Monitoring   Cycle Troponins   Seen By Cards in ED,  see Consult Note     3.   Chest pain   Telemetry Monitoring   Cycle Troponins     4.    Nausea and Vomiting- due to Esophagitis and/or Gastritis   Anti-Emetics PRN     5.   Epigastric pain- Possible Esophagitis or Gastritis or PUD   GI Cocktail x1 given in ED   Carafate PO QID started   May Need Outpatient Referral for GI Workup-     6.   Microcytic anemia-    Monitor Trend of H/Hs   Send Anemia panel   Refer for GI workup    7.   DVT Prophylaxis    Lovenox    Code Status:   FULL CODE  Family Communication:    Family at Bedside Disposition Plan:      Inpatient   Time spent:  [place]  C [facility] Pager [phone]   If [place] Please Contact the Day Rounding Team MD for [facility]  If 7PM-7AM, Please Contact night-coverage  [email or web address] Password [email or web address] 11/22/2013, 10:26 PM

## 2013-11-22 NOTE — ED Notes (Signed)
EMS brought patient from her home. States patient has been having nausea and vomiting since yesterday with generalized weakness.  Pt has been given zofran 4 mg iv. 20 G in left Forearm. Per EMS VS stable.

## 2013-11-22 NOTE — ED Notes (Signed)
Lauren RN obtained with Marya Amsler RN at bedside. Patient tolerated without incident.

## 2013-11-22 NOTE — ED Notes (Signed)
Admit Doctor at bedside.  

## 2013-11-22 NOTE — ED Notes (Signed)
MD at bedside. 

## 2013-11-22 NOTE — ED Notes (Addendum)
MD made aware of epigastric and back pain.

## 2013-11-23 ENCOUNTER — Inpatient Hospital Stay (HOSPITAL_COMMUNITY): Payer: Medicare Other

## 2013-11-23 ENCOUNTER — Encounter (HOSPITAL_COMMUNITY): Payer: Self-pay | Admitting: Internal Medicine

## 2013-11-23 LAB — URINALYSIS, ROUTINE W REFLEX MICROSCOPIC
BILIRUBIN URINE: NEGATIVE
Glucose, UA: NEGATIVE mg/dL
KETONES UR: NEGATIVE mg/dL
Nitrite: NEGATIVE
PH: 7 (ref 5.0–8.0)
PROTEIN: 100 mg/dL — AB
Specific Gravity, Urine: 1.005 (ref 1.005–1.030)
UROBILINOGEN UA: 0.2 mg/dL (ref 0.0–1.0)

## 2013-11-23 LAB — NA AND K (SODIUM & POTASSIUM), RAND UR
Potassium Urine: 11 mEq/L
Sodium, Ur: <20 mEq/L

## 2013-11-23 LAB — BASIC METABOLIC PANEL
ANION GAP: 12 (ref 5–15)
BUN: 12 mg/dL (ref 6–23)
CALCIUM: 9.1 mg/dL (ref 8.4–10.5)
CO2: 25 mEq/L (ref 19–32)
Chloride: 90 mEq/L — ABNORMAL LOW (ref 96–112)
Creatinine, Ser: 0.76 mg/dL (ref 0.50–1.10)
GFR, EST NON AFRICAN AMERICAN: 81 mL/min — AB (ref >90)
GLUCOSE: 105 mg/dL — AB (ref 70–99)
POTASSIUM: 3.8 meq/L (ref 3.7–5.3)
SODIUM: 127 meq/L — AB (ref 137–147)

## 2013-11-23 LAB — CBC
HCT: 29.2 % — ABNORMAL LOW (ref 36.0–46.0)
Hemoglobin: 9 g/dL — ABNORMAL LOW (ref 12.0–15.0)
MCH: 20.4 pg — ABNORMAL LOW (ref 26.0–34.0)
MCHC: 30.8 g/dL (ref 30.0–36.0)
MCV: 66.1 fL — AB (ref 78.0–100.0)
Platelets: 314 10*3/uL (ref 150–400)
RBC: 4.42 MIL/uL (ref 3.87–5.11)
RDW: 18.2 % — AB (ref 11.5–15.5)
WBC: 8.2 10*3/uL (ref 4.0–10.5)

## 2013-11-23 LAB — OSMOLALITY, URINE: OSMOLALITY UR: 121 mosm/kg — AB (ref 390–1090)

## 2013-11-23 LAB — TROPONIN I: Troponin I: <0.3 ng/mL (ref <0.30)

## 2013-11-23 LAB — LIPASE, BLOOD: Lipase: 30 U/L (ref 11–59)

## 2013-11-23 LAB — URINE MICROSCOPIC-ADD ON

## 2013-11-23 MED ORDER — ENOXAPARIN SODIUM 40 MG/0.4ML ~~LOC~~ SOLN
40.0000 mg | Freq: Every day | SUBCUTANEOUS | Status: DC
  Filled 2013-11-23: qty 0.4

## 2013-11-23 MED ORDER — SODIUM CHLORIDE 0.9 % IJ SOLN
3.0000 mL | Freq: Two times a day (BID) | INTRAMUSCULAR | Status: DC
  Administered 2013-11-25 – 2013-11-29 (×7): 3 mL via INTRAVENOUS

## 2013-11-23 MED ORDER — ONDANSETRON HCL 4 MG/2ML IJ SOLN: 4.0000 mg | Freq: Four times a day (QID) | INTRAMUSCULAR | Status: DC | PRN

## 2013-11-23 MED ORDER — ONDANSETRON HCL 4 MG PO TABS
4.0000 mg | ORAL_TABLET | Freq: Four times a day (QID) | ORAL | Status: DC | PRN
  Administered 2013-11-23 – 2013-11-24 (×2): 4 mg via ORAL
  Filled 2013-11-23 (×2): qty 1

## 2013-11-23 MED ORDER — DEXTROSE 5 % IV SOLN
1.0000 g | INTRAVENOUS | Status: DC
  Administered 2013-11-23 – 2013-11-24 (×2): 1 g via INTRAVENOUS
  Filled 2013-11-23 (×2): qty 10

## 2013-11-23 MED ORDER — HYDROMORPHONE HCL PF 1 MG/ML IJ SOLN
0.5000 mg | INTRAMUSCULAR | Status: DC | PRN
  Administered 2013-11-23: 1 mg via INTRAVENOUS
  Administered 2013-11-24 (×2): 0.5 mg via INTRAVENOUS
  Administered 2013-11-25: 1 mg via INTRAVENOUS
  Filled 2013-11-23 (×4): qty 1

## 2013-11-23 MED ORDER — OXYCODONE HCL 5 MG PO TABS
5.0000 mg | ORAL_TABLET | ORAL | Status: DC | PRN
  Administered 2013-11-23 – 2013-11-24 (×3): 5 mg via ORAL
  Filled 2013-11-23 (×3): qty 1

## 2013-11-23 MED ORDER — ACETAMINOPHEN 325 MG PO TABS
650.0000 mg | ORAL_TABLET | Freq: Four times a day (QID) | ORAL | Status: DC | PRN
  Administered 2013-11-27: 650 mg via ORAL
  Filled 2013-11-23: qty 2

## 2013-11-23 MED ORDER — SODIUM CHLORIDE 0.9 % IV SOLN: INTRAVENOUS | Status: DC

## 2013-11-23 MED ORDER — ACETAMINOPHEN 650 MG RE SUPP
650.0000 mg | Freq: Four times a day (QID) | RECTAL | Status: DC | PRN
Start: 2013-11-23 — End: 2013-11-27

## 2013-11-23 MED ORDER — ALUM & MAG HYDROXIDE-SIMETH 200-200-20 MG/5ML PO SUSP
30.0000 mL | Freq: Four times a day (QID) | ORAL | Status: DC | PRN
  Administered 2013-11-23 – 2013-11-24 (×4): 30 mL via ORAL
  Filled 2013-11-23 (×4): qty 30

## 2013-11-23 NOTE — Consult Note (Signed)
Consult: Gross hematuria Requested by: Dr. Eliseo Squires  History of Present Illness: 73 year old admitted for atypical chest pain, new LBBB and frequent PACs and short runs of a-fib. She has a stress test and an echo pending. Last night she developed gross hematuria with small clots. This seems to be clearing. A urinalysis did not seem too concerning for infection but did have too numerous to count red blood cells per high-powered field. The patient has a remote history bladder cancer from 1995 treated by Dr. Leory Plowman. She has not had recent cystoscopy or followup for bladder cancer. She continues to smoke. She typically avoids with a good stream and hasn't appreciated gross hematuria prior to this admission. She does have occasional frequency and urgency. She complains of low back pain. She has no trouble voiding. Her CBC appears stable and her kidney function is normal.  She underwent L-spine films which showed severe degenerative disc disease, scoliosis, there were no worrisome lytic or blastic lesions of the bones. I reviewed all the images.   Past Medical History  Diagnosis Date  . LBBB (left bundle branch block)   . Bladder cancer 1995  . Ectopic atrial beats    Past Surgical History  Procedure Laterality Date  . Back surgery      Home Medications:  Prescriptions prior to admission  Medication Sig Dispense Refill  . acetaminophen (TYLENOL) 500 MG tablet Take 500 mg by mouth every 8 (eight) hours as needed for moderate pain.      . Ca Carbonate-Mag Hydroxide (ROLAIDS) 550-110 MG CHEW Chew 1 tablet by mouth 2 (two) times daily as needed (for heartburn).       Allergies: No Known Allergies  History reviewed. No pertinent family history. Social History:  reports that she has been smoking.  She does not have any smokeless tobacco history on file. She reports that she does not drink alcohol. Her drug history is not on file.  ROS: A complete review of systems was performed.  All systems are  negative except for pertinent findings as noted. Review of Systems  All other systems reviewed and are negative.    Physical Exam:  Vital signs in last 24 hours: Temp:  [98 F (36.7 C)-98.4 F (36.9 C)] 98.4 F (36.9 C) (09/05 1203) Pulse Rate:  [25-94] 76 (09/05 1203) Resp:  [14-22] 18 (09/05 1203) BP: (115-164)/(8-116) 128/76 mmHg (09/05 1203) SpO2:  [95 %-100 %] 99 % (09/05 1203) Weight:  [55.23 kg (121 lb 12.2 oz)-55.248 kg (121 lb 12.8 oz)] 55.23 kg (121 lb 12.2 oz) (09/05 0400) General:  Alert and oriented, No acute distress HEENT: Normocephalic, atraumatic Neck: No JVD or lymphadenopathy Cardiovascular: Regular rate and rhythm Lungs: Regular rate and effort Abdomen: Soft, nontender, nondistended, no abdominal masses Back: No CVA tenderness Extremities: No edema Neurologic: Grossly intact  Laboratory Data:  Results for orders placed during the hospital encounter of 11/22/13 (from the past 24 hour(s))  APTT     Status: None   Collection Time    11/22/13  4:53 PM      Result Value Ref Range   aPTT 27  24 - 37 seconds  CBC     Status: Abnormal   Collection Time    11/22/13  4:53 PM      Result Value Ref Range   WBC 7.2  4.0 - 10.5 K/uL   RBC 4.51  3.87 - 5.11 MIL/uL   Hemoglobin 9.2 (*) 12.0 - 15.0 g/dL   HCT 28.7 (*) 36.0 - 46.0 %  MCV 63.6 (*) 78.0 - 100.0 fL   MCH 20.4 (*) 26.0 - 34.0 pg   MCHC 32.1  30.0 - 36.0 g/dL   RDW 17.8 (*) 11.5 - 15.5 %   Platelets 322  150 - 400 K/uL  COMPREHENSIVE METABOLIC PANEL     Status: Abnormal   Collection Time    11/22/13  4:53 PM      Result Value Ref Range   Sodium 123 (*) 137 - 147 mEq/L   Potassium 3.7  3.7 - 5.3 mEq/L   Chloride 83 (*) 96 - 112 mEq/L   CO2 25  19 - 32 mEq/L   Glucose, Bld 111 (*) 70 - 99 mg/dL   BUN 11  6 - 23 mg/dL   Creatinine, Ser 0.66  0.50 - 1.10 mg/dL   Calcium 10.0  8.4 - 10.5 mg/dL   Total Protein 6.9  6.0 - 8.3 g/dL   Albumin 3.7  3.5 - 5.2 g/dL   AST 26  0 - 37 U/L   ALT 14  0 - 35  U/L   Alkaline Phosphatase 63  39 - 117 U/L   Total Bilirubin 0.4  0.3 - 1.2 mg/dL   GFR calc non Af Amer 85 (*) >90 mL/min   GFR calc Af Amer >90  >90 mL/min   Anion gap 15  5 - 15  PROTIME-INR     Status: None   Collection Time    11/22/13  4:53 PM      Result Value Ref Range   Prothrombin Time 13.0  11.6 - 15.2 seconds   INR 0.98  0.00 - 1.49  I-STAT TROPOININ, ED     Status: None   Collection Time    11/22/13  5:10 PM      Result Value Ref Range   Troponin i, poc 0.02  0.00 - 0.08 ng/mL   Comment 3           URINALYSIS, ROUTINE W REFLEX MICROSCOPIC     Status: Abnormal   Collection Time    11/22/13  8:20 PM      Result Value Ref Range   Color, Urine YELLOW  YELLOW   APPearance CLEAR  CLEAR   Specific Gravity, Urine 1.004 (*) 1.005 - 1.030   pH 7.5  5.0 - 8.0   Glucose, UA NEGATIVE  NEGATIVE mg/dL   Hgb urine dipstick MODERATE (*) NEGATIVE   Bilirubin Urine NEGATIVE  NEGATIVE   Ketones, ur NEGATIVE  NEGATIVE mg/dL   Protein, ur NEGATIVE  NEGATIVE mg/dL   Urobilinogen, UA 0.2  0.0 - 1.0 mg/dL   Nitrite NEGATIVE  NEGATIVE   Leukocytes, UA NEGATIVE  NEGATIVE  URINE MICROSCOPIC-ADD ON     Status: Abnormal   Collection Time    11/22/13  8:20 PM      Result Value Ref Range   Squamous Epithelial / LPF FEW (*) RARE   WBC, UA 3-6  <3 WBC/hpf   RBC / HPF 3-6  <3 RBC/hpf   Bacteria, UA FEW (*) RARE  POC OCCULT BLOOD, ED     Status: None   Collection Time    11/22/13  9:09 PM      Result Value Ref Range   Fecal Occult Bld NEGATIVE  NEGATIVE  TROPONIN I     Status: None   Collection Time    11/22/13 11:38 PM      Result Value Ref Range   Troponin I <0.30  <0.30 ng/mL  NA  AND K (SODIUM & POTASSIUM), RAND UR     Status: None   Collection Time    11/23/13  2:26 AM      Result Value Ref Range   Sodium, Ur <20     Potassium Urine Timed 11    OSMOLALITY, URINE     Status: Abnormal   Collection Time    11/23/13  2:26 AM      Result Value Ref Range   Osmolality, Ur 121 (*)  390 - 1090 mOsm/kg  BASIC METABOLIC PANEL     Status: Abnormal   Collection Time    11/23/13  3:45 AM      Result Value Ref Range   Sodium 127 (*) 137 - 147 mEq/L   Potassium 3.8  3.7 - 5.3 mEq/L   Chloride 90 (*) 96 - 112 mEq/L   CO2 25  19 - 32 mEq/L   Glucose, Bld 105 (*) 70 - 99 mg/dL   BUN 12  6 - 23 mg/dL   Creatinine, Ser 0.76  0.50 - 1.10 mg/dL   Calcium 9.1  8.4 - 10.5 mg/dL   GFR calc non Af Amer 81 (*) >90 mL/min   GFR calc Af Amer >90  >90 mL/min   Anion gap 12  5 - 15  CBC     Status: Abnormal   Collection Time    11/23/13  3:45 AM      Result Value Ref Range   WBC 8.2  4.0 - 10.5 K/uL   RBC 4.42  3.87 - 5.11 MIL/uL   Hemoglobin 9.0 (*) 12.0 - 15.0 g/dL   HCT 29.2 (*) 36.0 - 46.0 %   MCV 66.1 (*) 78.0 - 100.0 fL   MCH 20.4 (*) 26.0 - 34.0 pg   MCHC 30.8  30.0 - 36.0 g/dL   RDW 18.2 (*) 11.5 - 15.5 %   Platelets 314  150 - 400 K/uL  TROPONIN I     Status: None   Collection Time    11/23/13  3:56 AM      Result Value Ref Range   Troponin I <0.30  <0.30 ng/mL  LIPASE, BLOOD     Status: None   Collection Time    11/23/13  3:56 AM      Result Value Ref Range   Lipase 30  11 - 59 U/L  URINALYSIS, ROUTINE W REFLEX MICROSCOPIC     Status: Abnormal   Collection Time    11/23/13  9:11 AM      Result Value Ref Range   Color, Urine RED (*) YELLOW   APPearance CLOUDY (*) CLEAR   Specific Gravity, Urine 1.005  1.005 - 1.030   pH 7.0  5.0 - 8.0   Glucose, UA NEGATIVE  NEGATIVE mg/dL   Hgb urine dipstick LARGE (*) NEGATIVE   Bilirubin Urine NEGATIVE  NEGATIVE   Ketones, ur NEGATIVE  NEGATIVE mg/dL   Protein, ur 100 (*) NEGATIVE mg/dL   Urobilinogen, UA 0.2  0.0 - 1.0 mg/dL   Nitrite NEGATIVE  NEGATIVE   Leukocytes, UA TRACE (*) NEGATIVE  URINE MICROSCOPIC-ADD ON     Status: Abnormal   Collection Time    11/23/13  9:11 AM      Result Value Ref Range   Squamous Epithelial / LPF FEW (*) RARE   WBC, UA 3-6  <3 WBC/hpf   RBC / HPF TOO NUMEROUS TO COUNT  <3 RBC/hpf    Bacteria, UA RARE  RARE  TROPONIN  I     Status: None   Collection Time    11/23/13  9:55 AM      Result Value Ref Range   Troponin I <0.30  <0.30 ng/mL   No results found for this or any previous visit (from the past 240 hour(s)). Creatinine:  Recent Labs  11/22/13 1653 11/23/13 0345  CREATININE 0.66 0.76    Impression/Assessment/plan: Gross hematuria-history of bladder cancer, smoker-I discussed with the patient and her daughter the nature risks benefits and alternatives to CT scan with IV contrast as well as cystoscopy. All questions answered. They elected to proceed with this evaluation. CT scan is pending. Depending on CT scan findings she will need followup in the outpatient for cystoscopy. I gave the patient and her daughter my card/contact information.  Will follow.    [name] 11/23/2013, 4:08 PM

## 2013-11-23 NOTE — Progress Notes (Signed)
PROGRESS NOTE  [name] CWC:[id number] DOB: [birth year] DOA: 11/22/2013 PCP: PROVIDER NOT IN SYSTEM  [name] is a 73 y.o. female with a remote history of bladder cancer (1995) who presents to the ED with complaints of nausea and vomiting x 2 days with substernal chest pain and epigastric pain. She denies any diarrhea, or hematemesis. She reports that she has had palpitations a feeling as if her heart was pounding. She reports that she had nausea and vomiting for 2 days last week and thought it was due to food poisoning  Assessment/Plan: Hyponatremia-  due to N+V IVFs with NSS to rehydrate  Monitor Na+Levels    Palpitations-  Telemetry Monitoring  Cycle Troponins  Seen By Cards in ED, see Consult Note - signed off- outpatient stress  Hematuria -h/o bladder cancer -does not have urologist -no h/o stones -recheck U/A- cards doctor mentioned UTI but appears to be previous U/A from 3 years ago -d/c lovenox -SCDs  Chest pain  Telemetry Monitoring  Cycle Troponins   Nausea and Vomiting- due to Esophagitis and/or Gastritis  Anti-Emetics PRN   Epigastric pain- Possible Esophagitis or Gastritis or PUD  GI Cocktail x1 given in ED  Carafate PO QID started  May Need Outpatient Referral for GI Workup  Microcytic anemia-  Monitor Trend of H/Hs  Send Anemia panel  Heme test stools before GI consult   Code Status: full Family Communication: daughter at bedside Disposition Plan:    Consultants:  Cardiology- signed off  Procedures:     HPI/Subjective: Developed hematuria last PM No further epigastric pain  Objective: Filed Vitals:   11/23/13 0757  BP: 140/85  Pulse: 85  Temp: 98.4 F (36.9 C)  Resp: 18    Intake/Output Summary (Last 24 hours) at 11/23/13 0818 Last data filed at 11/23/13 0700  Gross per 24 hour  Intake    450 ml  Output    400 ml  Net     50 ml   Filed Weights   11/22/13 2319 11/23/13 0400  Weight: 55.248 kg (121 lb 12.8 oz) 55.23  kg (121 lb 12.2 oz)    Exam:   General:  A+Ox3, NAD  Cardiovascular: rrr  Respiratory: clear  Abdomen: +BS, soft  Musculoskeletal: moves all 4 ext   Data Reviewed: Basic Metabolic Panel:  Recent Labs Lab 11/22/13 1653 11/23/13 0345  NA 123* 127*  K 3.7 3.8  CL 83* 90*  CO2 25 25  GLUCOSE 111* 105*  BUN 11 12  CREATININE 0.66 0.76  CALCIUM 10.0 9.1   Liver Function Tests:  Recent Labs Lab 11/22/13 1653  AST 26  ALT 14  ALKPHOS 63  BILITOT 0.4  PROT 6.9  ALBUMIN 3.7   No results found for this basename: LIPASE, AMYLASE,  in the last 168 hours No results found for this basename: AMMONIA,  in the last 168 hours CBC:  Recent Labs Lab 11/22/13 1653 11/23/13 0345  WBC 7.2 8.2  HGB 9.2* 9.0*  HCT 28.7* 29.2*  MCV 63.6* 66.1*  PLT 322 314   Cardiac Enzymes:  Recent Labs Lab 11/22/13 2338 11/23/13 0356  TROPONINI <0.30 <0.30   BNP (last 3 results) No results found for this basename: PROBNP,  in the last 8760 hours CBG: No results found for this basename: GLUCAP,  in the last 168 hours  No results found for this or any previous visit (from the past 240 hour(s)).   Studies: Dg Chest 2 View  11/22/2013   CLINICAL  DATA:  Nausea and vomiting.  Sternal and epigastric pain.  EXAM: CHEST - 2 VIEW  COMPARISON:  08/31/2010  FINDINGS: The heart size and mediastinal contours are within normal limits. Stable COPD with mild bilateral pulmonary hyperinflation. There is no evidence of pulmonary edema, consolidation, pneumothorax, nodule or pleural fluid. The visualized skeletal structures are unremarkable.  IMPRESSION: Stable COPD.  No active disease.   Electronically Signed   By: Aletta Edouard M.D.   On: 11/22/2013 19:03    Scheduled Meds: . enoxaparin (LOVENOX) injection  40 mg Subcutaneous Daily  . nicotine  14 mg Transdermal Daily  . sodium chloride  3 mL Intravenous Q12H  . sucralfate  1 g Oral 4 times per day   Continuous Infusions: . sodium chloride  Stopped (11/22/13 1802)  . sodium chloride 75 mL/hr at 11/23/13 0100   Antibiotics Given (last 72 hours)   None      Principal Problem:   Hyponatremia Active Problems:   Palpitations   Microcytic anemia   Chest pain   Epigastric pain    Time spent: 35 min    VANN, JESSICA  [facility] Pager [phone]. If 7PM-7AM, please contact night-coverage at [email or web address], password [facility] 11/23/2013, 8:18 AM  LOS: 1 day

## 2013-11-23 NOTE — Progress Notes (Signed)
Patient Name: [name]      SUBJECTIVE:no complaints this am   Past Medical History  Diagnosis Date  . LBBB (left bundle branch block)   . Bladder cancer 1995  . Ectopic atrial beats     Scheduled Meds:  Scheduled Meds: . nicotine  14 mg Transdermal Daily  . sodium chloride  3 mL Intravenous Q12H  . sucralfate  1 g Oral 4 times per day   Continuous Infusions: . sodium chloride Stopped (11/22/13 1802)  . sodium chloride 75 mL/hr at 11/23/13 0100   acetaminophen, acetaminophen, alum & mag hydroxide-simeth, HYDROmorphone (DILAUDID) injection, ondansetron (ZOFRAN) IV, ondansetron, oxyCODONE    PHYSICAL EXAM Filed Vitals:   11/22/13 2319 11/23/13 0400 11/23/13 0757 11/23/13 1203  BP: 136/85 125/84 140/85 128/76  Pulse: 81 77 85 76  Temp: 98.1 F (36.7 C) 98.3 F (36.8 C) 98.4 F (36.9 C) 98.4 F (36.9 C)  TempSrc: Oral Oral Oral Oral  Resp: 18 18 18 18   Height: 5\' 8"  (1.727 m)     Weight: 121 lb 12.8 oz (55.248 kg) 121 lb 12.2 oz (55.23 kg)    SpO2: 99% 96% 95% 99%    Well developed and nourished in no acute distress HENT normal Neck supple with JVP-flat Clear Regular rate and rhythm, no murmurs or gallops Abd-soft with active BS No Clubbing cyanosis edema Skin-warm and dry A & Oriented  Grossly normal sensory and motor function   TELEMETRY: Reviewed telemetry pt in sinus with frequent runs atrial tach    Intake/Output Summary (Last 24 hours) at 11/23/13 1250 Last data filed at 11/23/13 0936  Gross per 24 hour  Intake    690 ml  Output    800 ml  Net   -110 ml    LABS: Basic Metabolic Panel:  Recent Labs Lab 11/22/13 1653 11/23/13 0345  NA 123* 127*  K 3.7 3.8  CL 83* 90*  CO2 25 25  GLUCOSE 111* 105*  BUN 11 12  CREATININE 0.66 0.76  CALCIUM 10.0 9.1   Cardiac Enzymes:  Recent Labs  11/22/13 2338 11/23/13 0356 11/23/13 0955  TROPONINI <0.30 <0.30 <0.30   CBC:  Recent Labs Lab 11/22/13 1653 11/23/13 0345    WBC 7.2 8.2  HGB 9.2* 9.0*  HCT 28.7* 29.2*  MCV 63.6* 66.1*  PLT 322 314   PROTIME:  Recent Labs  11/22/13 1653  LABPROT 13.0  INR 0.98   Liver Function Tests:  Recent Labs  11/22/13 1653  AST 26  ALT 14  ALKPHOS 63  BILITOT 0.4  PROT 6.9  ALBUMIN 3.7    Recent Labs  11/23/13 0356  LIPASE 30      ASSESSMENT AND PLAN:  Atrial Ectopy  Alternating Bundlebranch block  Anemia-microcytuic  Bladder cancer-remotely with recurrent hematuris  Dyspnea on exeriton   Multitude of ongoing issues and how they all tied together is not clear to me. Her dyspnea could be related to her anemia. I am also concerned that it could be cardiac given the involving alternating bundle branch block.  Alternating bundle branch block in of itself is  an indication for pacing; we would need to ventricular systolic function is we will undertake an echo.  Treatment of her symptomatic tachypalpitations is problematic in the context of progressive conduction system disease and would be facilitated by backup bradycardia pacing and the use of antiarrhythmics is problematic as well in their use would be dependent on the presence  or absence of coronary disease and so stress testing will also be important.  The patient however is adamant that she wants to know whether she has recurrent and possible metastatic bladder cancer prior to undertaking any significant cardiac invasive procedures.  I have spoken with the hospitalist Dr. Eliseo Squires  will arrange evaluation of the above  I spent 45 minutes reviewing the chart and with the family  Signed, Virl Axe MD  11/23/2013

## 2013-11-23 NOTE — Progress Notes (Signed)
Patient got up to void and noticed that her urine was bloody. No reports of pain and/or burning. Patient stated that her urine was not like that in the ED. Notified Walden Field, NP. New orders for urine culture. Will continue to monitor. Lora Havens RN

## 2013-11-24 ENCOUNTER — Encounter (HOSPITAL_COMMUNITY): Payer: Self-pay | Admitting: Radiology

## 2013-11-24 ENCOUNTER — Inpatient Hospital Stay (HOSPITAL_COMMUNITY): Payer: Medicare Other

## 2013-11-24 DIAGNOSIS — I447 Left bundle-branch block, unspecified: Secondary | ICD-10-CM | POA: Diagnosis present

## 2013-11-24 DIAGNOSIS — R0609 Other forms of dyspnea: Secondary | ICD-10-CM

## 2013-11-24 DIAGNOSIS — I491 Atrial premature depolarization: Secondary | ICD-10-CM | POA: Diagnosis present

## 2013-11-24 DIAGNOSIS — I319 Disease of pericardium, unspecified: Secondary | ICD-10-CM

## 2013-11-24 DIAGNOSIS — R0989 Other specified symptoms and signs involving the circulatory and respiratory systems: Secondary | ICD-10-CM

## 2013-11-24 DIAGNOSIS — C679 Malignant neoplasm of bladder, unspecified: Secondary | ICD-10-CM | POA: Diagnosis present

## 2013-11-24 DIAGNOSIS — R319 Hematuria, unspecified: Secondary | ICD-10-CM

## 2013-11-24 LAB — URINE CULTURE
Colony Count: NO GROWTH
Culture: NO GROWTH

## 2013-11-24 LAB — BASIC METABOLIC PANEL
Anion gap: 9 (ref 5–15)
BUN: 14 mg/dL (ref 6–23)
CALCIUM: 8.1 mg/dL — AB (ref 8.4–10.5)
CO2: 25 meq/L (ref 19–32)
CREATININE: 0.6 mg/dL (ref 0.50–1.10)
Chloride: 98 mEq/L (ref 96–112)
GFR calc Af Amer: >90 mL/min (ref >90)
GFR calc non Af Amer: 88 mL/min — ABNORMAL LOW (ref >90)
GLUCOSE: 116 mg/dL — AB (ref 70–99)
Potassium: 3.8 mEq/L (ref 3.7–5.3)
Sodium: 132 mEq/L — ABNORMAL LOW (ref 137–147)

## 2013-11-24 LAB — FERRITIN: Ferritin: 4 ng/mL — ABNORMAL LOW (ref 10–291)

## 2013-11-24 LAB — CBC
HEMATOCRIT: 24.5 % — AB (ref 36.0–46.0)
HEMOGLOBIN: 7.6 g/dL — AB (ref 12.0–15.0)
MCH: 20.9 pg — AB (ref 26.0–34.0)
MCHC: 31 g/dL (ref 30.0–36.0)
MCV: 67.5 fL — AB (ref 78.0–100.0)
Platelets: 274 10*3/uL (ref 150–400)
RBC: 3.63 MIL/uL — ABNORMAL LOW (ref 3.87–5.11)
RDW: 18.3 % — ABNORMAL HIGH (ref 11.5–15.5)
WBC: 6.6 10*3/uL (ref 4.0–10.5)

## 2013-11-24 LAB — IRON AND TIBC
Iron: 14 ug/dL — ABNORMAL LOW (ref 42–135)
Saturation Ratios: 3 % — ABNORMAL LOW (ref 20–55)
TIBC: 502 ug/dL — ABNORMAL HIGH (ref 250–470)
UIBC: 488 ug/dL — AB (ref 125–400)

## 2013-11-24 MED ORDER — SODIUM CHLORIDE 0.9 % IV BOLUS (SEPSIS)
250.0000 mL | Freq: Once | INTRAVENOUS | Status: AC
  Administered 2013-11-24: 250 mL via INTRAVENOUS

## 2013-11-24 MED ORDER — IOHEXOL 300 MG/ML  SOLN
100.0000 mL | Freq: Once | INTRAMUSCULAR | Status: AC | PRN
  Administered 2013-11-24: 100 mL via INTRAVENOUS

## 2013-11-24 MED ORDER — PANTOPRAZOLE SODIUM 40 MG PO TBEC
40.0000 mg | DELAYED_RELEASE_TABLET | Freq: Two times a day (BID) | ORAL | Status: DC
  Administered 2013-11-24 – 2013-11-29 (×11): 40 mg via ORAL
  Filled 2013-11-24 (×11): qty 1

## 2013-11-24 NOTE — Progress Notes (Signed)
Subjective:  1 - Gross Hematuria, h/o Bladder Cancer - s/p treatment for bladder cancer 1995 and lost to follow up. Now readmitted for multiple medical problems and noted to have some occasional hematuria as well as anemia. CT Urogram ordered 9/5 and pending.  Today Rhylan is w/o complaints. Voiding with less hematuria, no clots.   Objective: Vital signs in last 24 hours: Temp:  [98 F (36.7 C)-98.7 F (37.1 C)] 98.7 F (37.1 C) (09/06 0833) Pulse Rate:  [73-93] 77 (09/06 0833) Resp:  [16-18] 18 (09/06 0833) BP: (110-147)/(56-91) 127/75 mmHg (09/06 0833) SpO2:  [97 %-100 %] 98 % (09/06 0833) Last BM Date: 11/22/13 (per pt)  Intake/Output from previous day: 09/05 0701 - 09/06 0700 In: 720 [P.O.:720] Out: 400 [Urine:400] Intake/Output this shift: Total I/O In: 360 [P.O.:360] Out: -   General appearance: alert, cooperative, appears stated age and daughter at bedside Head: Normocephalic, without obvious abnormality, atraumatic Neck: supple, symmetrical, trachea midline Back: symmetric, no curvature. ROM normal. No CVA tenderness. Resp: non-labored on room air.  Cardio: Nl rate GI: soft, non-tender; bowel sounds normal; no masses,  no organomegaly Pelvic: external genitalia normal Extremities: extremities normal, atraumatic, no cyanosis or edema Skin: Skin color, texture, turgor normal. No rashes or lesions or some icterus v. chronic smoking changes Lymph nodes: Cervical, supraclavicular, and axillary nodes normal. Neurologic: Grossly normal  Lab Results:   Recent Labs  11/23/13 0345 11/24/13 0305  WBC 8.2 6.6  HGB 9.0* 7.6*  HCT 29.2* 24.5*  PLT 314 274   BMET  Recent Labs  11/23/13 0345 11/24/13 0305  NA 127* 132*  K 3.8 3.8  CL 90* 98  CO2 25 25  GLUCOSE 105* 116*  BUN 12 14  CREATININE 0.76 0.60  CALCIUM 9.1 8.1*   PT/INR  Recent Labs  11/22/13 1653  LABPROT 13.0  INR 0.98   ABG No results found for this basename: PHART, PCO2, PO2, HCO3,   in the last 72 hours  Studies/Results: Dg Chest 2 View  11/22/2013   CLINICAL DATA:  Nausea and vomiting.  Sternal and epigastric pain.  EXAM: CHEST - 2 VIEW  COMPARISON:  08/31/2010  FINDINGS: The heart size and mediastinal contours are within normal limits. Stable COPD with mild bilateral pulmonary hyperinflation. There is no evidence of pulmonary edema, consolidation, pneumothorax, nodule or pleural fluid. The visualized skeletal structures are unremarkable.  IMPRESSION: Stable COPD.  No active disease.   Electronically Signed   By: Aletta Edouard M.D.   On: 11/22/2013 19:03   Dg Lumbar Spine 2-3 Views  11/23/2013   CLINICAL DATA:  Lumbar spine pain for 1 year radiating to the buttocks. History of back surgery due to herniated disc  EXAM: LUMBAR SPINE - 2-3 VIEW  COMPARISON:  None.  FINDINGS: There are 5 non rib-bearing lumbar type vertebral bodies.  There is a mild to moderate scoliotic curvature of the thoracolumbar spine with dominant cranial component convex to the right. No definite anterolisthesis or retrolisthesis.  Lumbar vertebral body heights are preserved given obliquity.  There is moderate to severe multilevel lumbar spine DDD, likely worse at L1-L2, L2-L3 and L4-L5 with disc space height loss, endplate irregularity and sclerosis.  Atherosclerotic plaque within the abdominal aorta.  Bowel-gas pattern regional soft tissues appear normal. No radiopaque foreign body.  IMPRESSION: 1. No definite acute findings. 2. Mild to moderate scoliotic curvature of the thoracolumbar spine with associated moderate to severe multilevel lumbar spine DDD.   Electronically Signed   By: Jenny Reichmann  Watts M.D.   On: 11/23/2013 15:02    Anti-infectives: Anti-infectives   Start     Dose/Rate Route Frequency Ordered Stop   11/23/13 1400  cefTRIAXone (ROCEPHIN) 1 g in dextrose 5 % 50 mL IVPB     1 g 100 mL/hr over 30 Minutes Intravenous Every 24 hours 11/23/13 1350        Assessment/Plan:  1 - Gross Hematuria, h/o  Bladder Cancer - Needs CT urogram today. Most important aspect is IV contrast and pre/ post / delayed images. PO contrast and PO hydration less important. She can get IV fluid bolus if needed if hydration is concern as she cannot tollerate large amounts PO.  Cysto pending findings. I also feel prudent to rule out large volume malignancy before aggressive medical course given her history of bladder cancer without surveillance.  [facility], [name] 11/24/2013

## 2013-11-24 NOTE — Progress Notes (Signed)
  Echocardiogram 2D Echocardiogram has been performed.  Donata Clay 11/24/2013, 2:08 PM

## 2013-11-24 NOTE — Progress Notes (Signed)
Chart reviewed.   PROGRESS NOTE  [name] PPI:[id number] DOB: [birth year] DOA: 11/22/2013 PCP: Abigail Miyamoto, MD  [name] is a 73 y.o. female with a remote history of bladder cancer (1995) who presents to the ED with complaints of nausea and vomiting x 2 days with substernal chest pain and epigastric pain. She denies any diarrhea, or hematemesis. She reports that she has had palpitations a feeling as if her heart was pounding. She reports that she had nausea and vomiting for 2 days last week and thought it was due to food poisoning  Assessment/Plan: Hyponatremia, hypovolemic Resolving.   Atrial ectopy with alternating BBB Echo pending. Appreciate Dr. Caryl Comes.  Hematuria Imaging shows multiple masses, likely recurrence. Cysto per urology  Chest pain likely GI etiology. MI ruled out  Nausea and Vomiting: no further vomiting, but appetite poor. CT shows fluid filled esophagus and gastric antral wall thickening. Never had EGD. Will need at some point after urologic and cardiac issues stabilized/worked up. On carafate. Will add bid PPI.  Iron deficiency anemia: likely from hematuria. stools heme negative. hgb decreased with hydration and hematuria. If less than 7, or symptomatic, will transfuse.  Code Status: full Family Communication: daughter at bedside Disposition Plan:    Consultants:  Cardiology  urology  Procedures:    HPI/Subjective: No vomiting. Poor appetite. Some epigastric discomfort. No palpitations.  Objective: Filed Vitals:   11/24/13 1307  BP: 134/82  Pulse: 82  Temp: 98.8 F (37.1 C)  Resp: 18    Intake/Output Summary (Last 24 hours) at 11/24/13 1438 Last data filed at 11/24/13 0835  Gross per 24 hour  Intake    600 ml  Output      0 ml  Net    600 ml   Filed Weights   11/22/13 2319 11/23/13 0400  Weight: 55.248 kg (121 lb 12.8 oz) 55.23 kg (121 lb 12.2 oz)    Exam:   General:  A+Ox3, NAD  Cardiovascular: rrr without  MGR  Respiratory: clear without WRR  Abdomen: +BS, soft  Ext: no CCE  Data Reviewed: Basic Metabolic Panel:  Recent Labs Lab 11/22/13 1653 11/23/13 0345 11/24/13 0305  NA 123* 127* 132*  K 3.7 3.8 3.8  CL 83* 90* 98  CO2 25 25 25   GLUCOSE 111* 105* 116*  BUN 11 12 14   CREATININE 0.66 0.76 0.60  CALCIUM 10.0 9.1 8.1*   Liver Function Tests:  Recent Labs Lab 11/22/13 1653  AST 26  ALT 14  ALKPHOS 63  BILITOT 0.4  PROT 6.9  ALBUMIN 3.7    Recent Labs Lab 11/23/13 0356  LIPASE 30   No results found for this basename: AMMONIA,  in the last 168 hours CBC:  Recent Labs Lab 11/22/13 1653 11/23/13 0345 11/24/13 0305  WBC 7.2 8.2 6.6  HGB 9.2* 9.0* 7.6*  HCT 28.7* 29.2* 24.5*  MCV 63.6* 66.1* 67.5*  PLT 322 314 274   Cardiac Enzymes:  Recent Labs Lab 11/22/13 2338 11/23/13 0356 11/23/13 0955  TROPONINI <0.30 <0.30 <0.30   BNP (last 3 results) No results found for this basename: PROBNP,  in the last 8760 hours CBG: No results found for this basename: GLUCAP,  in the last 168 hours  Recent Results (from the past 240 hour(s))  URINE CULTURE     Status: None   Collection Time    11/23/13  2:26 AM      Result Value Ref Range Status   Specimen Description URINE, RANDOM  Final   Special Requests NONE   Final   Culture  Setup Time     Final   Value: 11/23/2013 10:19     Performed at [place]     Final   Value: NO GROWTH     Performed at [facility]   Culture     Final   Value: NO GROWTH     Performed at [facility]   Report Status 11/24/2013 FINAL   Final     Studies: Ct Abdomen Pelvis W Wo Contrast  11/24/2013   CLINICAL DATA:  Gross hematuria with history of bladder cancer 20 years ago.  EXAM: CT ABDOMEN AND PELVIS WITHOUT AND WITH CONTRAST  TECHNIQUE: Multidetector CT imaging of the abdomen and pelvis was performed following the standard protocol before and following the bolus administration of  intravenous contrast.  CONTRAST:  168mL OMNIPAQUE IOHEXOL 300 MG/ML  SOLN  COMPARISON:  None.  FINDINGS: Lung bases: Centrilobular emphysema. Subpleural right middle lobe nodular density of 4 mm on image 1. Mild cardiomegaly. Dilated, fluid-filled lower esophagus on image 2 of series 301.  Kidneys / Ureters / Bladder: No renal calculi or hydronephrosis. No hydroureter or ureteric calculi. No bladder calculi. No renal mass on post-contrast images. Moderate renal collecting system opacification on delayed images. The ureters are moderately well opacified on delayed images. Portions of the mid right and distal left ureter are incompletely opacified. No filling defects within the opacified portions.  Multiple enhancing bladder masses are identified. 6.0 x 5.3 cm anteriorly and superiorly on image 58 of series 301. A small right posterior bladder nodule at 8 mm on image 59.  Right paracentral dependent bladder base 2.2 x 2.7 cm lesion on image 70 of series 301. More anterior inferior 9 mm nodule on image 72.  No gross transmural extension identified.  Other: Normal liver, spleen. Gastric antral wall thickening on image 74 of series 301. This is moderate. Adjacent porta hepatis nodes, including a 9 mm node on image 30.  Upper normal pancreatic duct size. No cause identified. Normal gallbladder, biliary tract, adrenal glands.  Aortic and branch vessel atherosclerosis. No retroperitoneal or retrocrural adenopathy. Colonic stool burden suggests constipation. Normal small bowel.  No pelvic adenopathy. Normal uterus without adnexal mass. Small volume pelvic cul-de-sac fluid, of indeterminate etiology.  Bones / Musculoskeletal: Moderate osteopenia. Convex right lumbar spine curvature. Advanced lumbar spondylosis  IMPRESSION: 1. 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 No evidence of metastatic disease. No gross transmural extension. 2. Moderate gastric antral wall  thickening. This could represent gastritis or gastric carcinoma. Endoscopy should be considered. Adjacent porta hepatis nodes are indeterminate. If the patient is diagnosed with gastric carcinoma, nodal metastasis would be a concern. 3. Esophageal air fluid level suggests dysmotility or gastroesophageal reflux. 4.  Possible constipation. 5. Small volume cul-de-sac fluid. 6. Centrilobular emphysema with nonspecific right lung base nodule.   Electronically Signed   By: Abigail Miyamoto M.D.   On: 11/24/2013 11:55   Dg Chest 2 View  11/22/2013   CLINICAL DATA:  Nausea and vomiting.  Sternal and epigastric pain.  EXAM: CHEST - 2 VIEW  COMPARISON:  08/31/2010  FINDINGS: The heart size and mediastinal contours are within normal limits. Stable COPD with mild bilateral pulmonary hyperinflation. There is no evidence of pulmonary edema, consolidation, pneumothorax, nodule or pleural fluid. The visualized skeletal structures are unremarkable.  IMPRESSION: Stable COPD.  No active disease.  Electronically Signed   By: Aletta Edouard M.D.   On: 11/22/2013 19:03   Dg Lumbar Spine 2-3 Views  11/23/2013   CLINICAL DATA:  Lumbar spine pain for 1 year radiating to the buttocks. History of back surgery due to herniated disc  EXAM: LUMBAR SPINE - 2-3 VIEW  COMPARISON:  None.  FINDINGS: There are 5 non rib-bearing lumbar type vertebral bodies.  There is a mild to moderate scoliotic curvature of the thoracolumbar spine with dominant cranial component convex to the right. No definite anterolisthesis or retrolisthesis.  Lumbar vertebral body heights are preserved given obliquity.  There is moderate to severe multilevel lumbar spine DDD, likely worse at L1-L2, L2-L3 and L4-L5 with disc space height loss, endplate irregularity and sclerosis.  Atherosclerotic plaque within the abdominal aorta.  Bowel-gas pattern regional soft tissues appear normal. No radiopaque foreign body.  IMPRESSION: 1. No definite acute findings. 2. Mild to moderate  scoliotic curvature of the thoracolumbar spine with associated moderate to severe multilevel lumbar spine DDD.   Electronically Signed   By: Sandi Mariscal M.D.   On: 11/23/2013 15:02    Scheduled Meds: . nicotine  14 mg Transdermal Daily  . sodium chloride  3 mL Intravenous Q12H  . sucralfate  1 g Oral 4 times per day   Continuous Infusions: . sodium chloride Stopped (11/22/13 1802)  . sodium chloride 75 mL/hr at 11/23/13 0100   Antibiotics Given (last 72 hours)   Date/Time Action Medication Dose Rate   11/23/13 1556 Given   cefTRIAXone (ROCEPHIN) 1 g in dextrose 5 % 50 mL IVPB 1 g 100 mL/hr   11/24/13 1418 Given   cefTRIAXone (ROCEPHIN) 1 g in dextrose 5 % 50 mL IVPB 1 g 100 mL/hr     Time spent: 35 min  [facility] Pager [phone]. If 7PM-7AM, please contact night-coverage at [email or web address], password [facility] 11/24/2013, 2:38 PM  LOS: 2 days

## 2013-11-24 NOTE — Progress Notes (Signed)
Patient given 900 ml ice water PO for CT Hematuria protocol- Tolerated well- completed in 18 mins out of 20.  219ml IV 0.9% normal saline bolus given over 7 minutes prior to CT.

## 2013-11-24 NOTE — Progress Notes (Signed)
Patient Name: [name]      SUBJECTIVE:no complaints this am   Past Medical History  Diagnosis Date  . LBBB (left bundle branch block)   . Bladder cancer 1995  . Ectopic atrial beats     Scheduled Meds:  Scheduled Meds: . cefTRIAXone (ROCEPHIN)  IV  1 g Intravenous Q24H  . nicotine  14 mg Transdermal Daily  . sodium chloride  3 mL Intravenous Q12H  . sucralfate  1 g Oral 4 times per day   Continuous Infusions: . sodium chloride Stopped (11/22/13 1802)  . sodium chloride 75 mL/hr at 11/23/13 0100   acetaminophen, acetaminophen, alum & mag hydroxide-simeth, HYDROmorphone (DILAUDID) injection, ondansetron (ZOFRAN) IV, ondansetron, oxyCODONE    PHYSICAL EXAM Filed Vitals:   11/23/13 2000 11/24/13 0012 11/24/13 0430 11/24/13 0833  BP: 146/88 133/76 147/91 127/75  Pulse: 73 77 87 77  Temp: 98 F (36.7 C) 98.2 F (36.8 C) 98.7 F (37.1 C) 98.7 F (37.1 C)  TempSrc:    Oral  Resp: 16 16 18 18   Height:      Weight:      SpO2: 97% 97% 100% 98%    Well developed and nourished in no acute distress HENT normal Neck supple with JVP-flat Clear Regular rate and rhythm, no murmurs or gallops Abd-soft with active BS No Clubbing cyanosis edema Skin-warm and dry A & Oriented  Grossly normal sensory and motor function   TELEMETRY: Reviewed telemetry pt in sinus with frequent runs atrial tach    Intake/Output Summary (Last 24 hours) at 11/24/13 0955 Last data filed at 11/24/13 0835  Gross per 24 hour  Intake    840 ml  Output      0 ml  Net    840 ml    LABS: Basic Metabolic Panel:  Recent Labs Lab 11/22/13 1653 11/23/13 0345 11/24/13 0305  NA 123* 127* 132*  K 3.7 3.8 3.8  CL 83* 90* 98  CO2 25 25 25   GLUCOSE 111* 105* 116*  BUN 11 12 14   CREATININE 0.66 0.76 0.60  CALCIUM 10.0 9.1 8.1*   Cardiac Enzymes:  Recent Labs  11/22/13 2338 11/23/13 0356 11/23/13 0955  TROPONINI <0.30 <0.30 <0.30   CBC:  Recent Labs Lab  11/22/13 1653 11/23/13 0345 11/24/13 0305  WBC 7.2 8.2 6.6  HGB 9.2* 9.0* 7.6*  HCT 28.7* 29.2* 24.5*  MCV 63.6* 66.1* 67.5*  PLT 322 314 274   PROTIME:  Recent Labs  11/22/13 1653  LABPROT 13.0  INR 0.98   Liver Function Tests:  Recent Labs  11/22/13 1653  AST 26  ALT 14  ALKPHOS 63  BILITOT 0.4  PROT 6.9  ALBUMIN 3.7    Recent Labs  11/23/13 0356  LIPASE 30      ASSESSMENT AND PLAN:  Atrial Ectopy  Alternating Bundlebranch block  Anemia-microcytuic  Bladder cancer-remotely with recurrent hematuris  Dyspnea on exertion     Evaluation ongoing for bladder cancer   Echo pending  Reviewed ECGs with JA   He correctly points out while V1 has RBBB, V6 doesn't look like it and neither does the anterior precordium    Treatment of her symptomatic tachypalpitations is problematic in the context of progressive conduction system disease and would be facilitated by backup bradycardia pacing and the use of antiarrhythmics is problematic as well in their use would be dependent on the presence or absence of coronary disease and so stress testing will  also be important.  If ECho normal  iwll begin flecainide and if not amio She will need outpt 30 d monitor and myoview       Signed, Virl Axe MD  11/24/2013

## 2013-11-25 ENCOUNTER — Inpatient Hospital Stay (HOSPITAL_COMMUNITY): Payer: Medicare Other

## 2013-11-25 ENCOUNTER — Other Ambulatory Visit (HOSPITAL_COMMUNITY): Payer: Medicare Other

## 2013-11-25 DIAGNOSIS — I420 Dilated cardiomyopathy: Secondary | ICD-10-CM | POA: Diagnosis present

## 2013-11-25 DIAGNOSIS — I447 Left bundle-branch block, unspecified: Secondary | ICD-10-CM

## 2013-11-25 HISTORY — PX: CARDIOVASCULAR STRESS TEST: SHX262

## 2013-11-25 LAB — TYPE AND SCREEN
ABO/RH(D): A NEG
Antibody Screen: NEGATIVE

## 2013-11-25 LAB — HEMOGLOBIN AND HEMATOCRIT, BLOOD
HEMATOCRIT: 24.5 % — AB (ref 36.0–46.0)
Hemoglobin: 7.5 g/dL — ABNORMAL LOW (ref 12.0–15.0)

## 2013-11-25 LAB — ABO/RH: ABO/RH(D): A NEG

## 2013-11-25 MED ORDER — METOCLOPRAMIDE HCL 5 MG/ML IJ SOLN
5.0000 mg | Freq: Three times a day (TID) | INTRAMUSCULAR | Status: DC
  Administered 2013-11-25 – 2013-11-26 (×3): 5 mg via INTRAVENOUS
  Filled 2013-11-25 (×7): qty 1

## 2013-11-25 MED ORDER — METOCLOPRAMIDE HCL 5 MG/ML IJ SOLN
5.0000 mg | Freq: Once | INTRAMUSCULAR | Status: AC
  Administered 2013-11-25: 5 mg via INTRAVENOUS
  Filled 2013-11-25: qty 1

## 2013-11-25 NOTE — Progress Notes (Signed)
Subjective: Patient reports that she has not noticed blood in her urine for 2-3 days. She is having no voiding problems. Both daughters and family are at bedside.  Objective: Vital signs in last 24 hours: Temp:  [98.2 F (36.8 C)-98.9 F (37.2 C)] 98.6 F (37 C) (09/07 0424) Pulse Rate:  [67-82] 68 (09/07 0424) Resp:  [16-18] 16 (09/07 0424) BP: (118-135)/(73-94) 118/74 mmHg (09/07 0424) SpO2:  [94 %-98 %] 96 % (09/07 0424)  Intake/Output from previous day: 09/06 0701 - 09/07 0700 In: 460 [P.O.:460] Out: -  Intake/Output this shift:    Physical Exam:  Constitutional: Vital signs reviewed. WD WN in NAD   Eyes: PERRL, No scleral icterus.   Pulmonary/Chest: Normal effort   Lab Results:  Recent Labs  11/23/13 0345 11/24/13 0305 11/25/13 0210  HGB 9.0* 7.6* 7.5*  HCT 29.2* 24.5* 24.5*   BMET  Recent Labs  11/23/13 0345 11/24/13 0305  NA 127* 132*  K 3.8 3.8  CL 90* 98  CO2 25 25  GLUCOSE 105* 116*  BUN 12 14  CREATININE 0.76 0.60  CALCIUM 9.1 8.1*    Recent Labs  11/22/13 1653  INR 0.98   No results found for this basename: LABURIN,  in the last 72 hours Results for orders placed during the hospital encounter of 11/22/13  URINE CULTURE     Status: None   Collection Time    11/23/13  2:26 AM      Result Value Ref Range Status   Specimen Description URINE, RANDOM   Final   Special Requests NONE   Final   Culture  Setup Time     Final   Value: 11/23/2013 10:19     Performed at [place]     Final   Value: NO GROWTH     Performed at [facility]   Culture     Final   Value: NO GROWTH     Performed at [facility]   Report Status 11/24/2013 FINAL   Final    Studies/Results: Ct Abdomen Pelvis W Wo Contrast  11/24/2013   CLINICAL DATA:  Gross hematuria with history of bladder cancer 20 years ago.  EXAM: CT ABDOMEN AND PELVIS WITHOUT AND WITH CONTRAST  TECHNIQUE: Multidetector CT imaging of the abdomen and  pelvis was performed following the standard protocol before and following the bolus administration of intravenous contrast.  CONTRAST:  126mL OMNIPAQUE IOHEXOL 300 MG/ML  SOLN  COMPARISON:  None.  FINDINGS: Lung bases: Centrilobular emphysema. Subpleural right middle lobe nodular density of 4 mm on image 1. Mild cardiomegaly. Dilated, fluid-filled lower esophagus on image 2 of series 301.  Kidneys / Ureters / Bladder: No renal calculi or hydronephrosis. No hydroureter or ureteric calculi. No bladder calculi. No renal mass on post-contrast images. Moderate renal collecting system opacification on delayed images. The ureters are moderately well opacified on delayed images. Portions of the mid right and distal left ureter are incompletely opacified. No filling defects within the opacified portions.  Multiple enhancing bladder masses are identified. 6.0 x 5.3 cm anteriorly and superiorly on image 58 of series 301. A small right posterior bladder nodule at 8 mm on image 59.  Right paracentral dependent bladder base 2.2 x 2.7 cm lesion on image 70 of series 301. More anterior inferior 9 mm nodule on image 72.  No gross transmural extension identified.  Other: Normal liver, spleen. Gastric antral wall thickening on image 74 of series 301.  This is moderate. Adjacent porta hepatis nodes, including a 9 mm node on image 30.  Upper normal pancreatic duct size. No cause identified. Normal gallbladder, biliary tract, adrenal glands.  Aortic and branch vessel atherosclerosis. No retroperitoneal or retrocrural adenopathy. Colonic stool burden suggests constipation. Normal small bowel.  No pelvic adenopathy. Normal uterus without adnexal mass. Small volume pelvic cul-de-sac fluid, of indeterminate etiology.  Bones / Musculoskeletal: Moderate osteopenia. Convex right lumbar spine curvature. Advanced lumbar spondylosis  IMPRESSION: 1. 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 No  evidence of metastatic disease. No gross transmural extension. 2. Moderate gastric antral wall thickening. This could represent gastritis or gastric carcinoma. Endoscopy should be considered. Adjacent porta hepatis nodes are indeterminate. If the patient is diagnosed with gastric carcinoma, nodal metastasis would be a concern. 3. Esophageal air fluid level suggests dysmotility or gastroesophageal reflux. 4.  Possible constipation. 5. Small volume cul-de-sac fluid. 6. Centrilobular emphysema with nonspecific right lung base nodule.   Electronically Signed   By: Abigail Miyamoto M.D.   On: 11/24/2013 11:55   Dg Lumbar Spine 2-3 Views  11/23/2013   CLINICAL DATA:  Lumbar spine pain for 1 year radiating to the buttocks. History of back surgery due to herniated disc  EXAM: LUMBAR SPINE - 2-3 VIEW  COMPARISON:  None.  FINDINGS: There are 5 non rib-bearing lumbar type vertebral bodies.  There is a mild to moderate scoliotic curvature of the thoracolumbar spine with dominant cranial component convex to the right. No definite anterolisthesis or retrolisthesis.  Lumbar vertebral body heights are preserved given obliquity.  There is moderate to severe multilevel lumbar spine DDD, likely worse at L1-L2, L2-L3 and L4-L5 with disc space height loss, endplate irregularity and sclerosis.  Atherosclerotic plaque within the abdominal aorta.  Bowel-gas pattern regional soft tissues appear normal. No radiopaque foreign body.  IMPRESSION: 1. No definite acute findings. 2. Mild to moderate scoliotic curvature of the thoracolumbar spine with associated moderate to severe multilevel lumbar spine DDD.   Electronically Signed   By: Sandi Mariscal M.D.   On: 11/23/2013 15:02   CT images were at first reviewed with the patient's daughters. She has one large bladder tumor at the dome of her bladder, one smaller one at the base, and several smaller implants. I do not see any evidence of pelvic adenopathy. Bladder wall is somewhat  thin. 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    I had a long discussion, first with the patient's daughters. Following a lengthy discussion, this was repeated when the patient came back from her stress test. Obviously, she does need eventual management of her recurrent bladder tumors with TURBT. I discussed the procedure with them, as well as the relative outpatient management with overnight stay following the procedure. I also discussed the fact that, with her large tumor burden, sometimes more than one resection is needed/staged procedure is done. I discussed the nature of bladder cancer, and that sometimes it invades the bladder wall, which would necessitate eventual cystectomy and the large majority of individuals. I do recommend that this be done within the next few weeks. They do know that this is a long term process, and that her current tumor burden has most likely been there for some time.    I do not think that the patient has had a severe ongoing blood loss from this process, as she says that she is only had gross hematuria on  one recent occasion. I don't think that there is a reason to rush this procedure currently. I have discussed this with the patient and her daughters.    We will followup with the patient i.e. get an eventual TURBT schedule. First, however, the gastric situation should be taken care of. I will discuss this with Dr. Junious Silk, who is her consulting physician.    LOS: 3 days   Franchot Gallo M 11/25/2013, 10:40 AM  45 minutes spent with the patient and her family.

## 2013-11-25 NOTE — Progress Notes (Signed)
Patient examined chart reviewed.  Primary issue is GI currently Lungs clear appears euvolemic Continue medical Rx for CHF.  Also has significant anemia.  Consider outpatient f/u in CHF clinic  [facility]

## 2013-11-25 NOTE — Progress Notes (Signed)
Patient Name: [name] Date of Encounter: 11/25/2013    Principal Problem:   Hyponatremia Active Problems:   Bladder cancer   Hematuria   Congestive dilated cardiomyopathy   Palpitations   Anemia, iron deficiency   Chest pain   Epigastric pain   Atrial ectopy   LBBB (left bundle branch block)    SUBJECTIVE  Remains nauseated with intermittent epigastric pain.  Continues to have hematuria.  Echo yesterday showed severe LV dysfxn with diffuse HK.  EF 20-25%.  No prior h/o CAD or cardiomyopathy.  Cont to have intermittent palpitations with frequent PAC's on tele.  CURRENT MEDS . nicotine  14 mg Transdermal Daily  . pantoprazole  40 mg Oral BID  . sodium chloride  3 mL Intravenous Q12H  . sucralfate  1 g Oral 4 times per day    OBJECTIVE  Filed Vitals:   11/24/13 1738 11/24/13 2000 11/25/13 0018 11/25/13 0424  BP: 135/94 125/73 121/73 118/74  Pulse: 77 68 67 68  Temp: 98.2 F (36.8 C) 98.9 F (37.2 C) 98.2 F (36.8 C) 98.6 F (37 C)  TempSrc: Oral   Oral  Resp: 18 16 16 16   Height:      Weight:      SpO2: 98% 96% 96% 96%    Intake/Output Summary (Last 24 hours) at 11/25/13 0652 Last data filed at 11/24/13 1330  Gross per 24 hour  Intake    460 ml  Output      0 ml  Net    460 ml   Filed Weights   11/22/13 2319 11/23/13 0400  Weight: 121 lb 12.8 oz (55.248 kg) 121 lb 12.2 oz (55.23 kg)    PHYSICAL EXAM  General: Pleasant, NAD. Neuro: Alert and oriented X 3. Moves all extremities spontaneously. Psych: Normal affect. HEENT:  Normal  Neck: Supple without bruits or JVD. Lungs:  Resp regular and unlabored, CTA. Heart: RRR no s3, s4, or murmurs. Abdomen: Soft, non-tender, non-distended, BS + x 4.  Extremities: No clubbing, cyanosis or edema. DP/PT/Radials 2+ and equal bilaterally.  Accessory Clinical Findings  CBC  Recent Labs  11/23/13 0345 11/24/13 0305 11/25/13 0210  WBC 8.2 6.6  --   HGB 9.0* 7.6* 7.5*  HCT 29.2* 24.5* 24.5*  MCV  66.1* 67.5*  --   PLT 314 274  --    Basic Metabolic Panel  Recent Labs  11/23/13 0345 11/24/13 0305  NA 127* 132*  K 3.8 3.8  CL 90* 98  CO2 25 25  GLUCOSE 105* 116*  BUN 12 14  CREATININE 0.76 0.60  CALCIUM 9.1 8.1*   Liver Function Tests  Recent Labs  11/22/13 1653  AST 26  ALT 14  ALKPHOS 63  BILITOT 0.4  PROT 6.9  ALBUMIN 3.7    Recent Labs  11/23/13 0356  LIPASE 30   Cardiac Enzymes  Recent Labs  11/22/13 2338 11/23/13 0356 11/23/13 0955  TROPONINI <0.30 <0.30 <0.30   TELE  Sinus rhythm, freq pac's, lbbb, rare pvc.  2D Echocardiogram 9.6.2015  Study Conclusions  - Left ventricle: The cavity size was mildly dilated. Wall   thickness was normal. Systolic function was severely reduced. The   estimated ejection fraction was in the range of 20% to 25%.   Diffuse hypokinesis. Doppler parameters are consistent with   abnormal left ventricular relaxation (grade 1 diastolic   dysfunction). - Mitral valve: There was mild regurgitation. - Left atrium: The atrium was severely dilated. - Pulmonary arteries:  Systolic pressure was mildly increased. PA   peak pressure: 41 mm Hg (S). - Pericardium, extracardiac: A small pericardial effusion was   identified. _____________   Radiology/Studies  Ct Abdomen Pelvis W Wo Contrast  11/24/2013   CLINICAL DATA:  Gross hematuria with history of bladder cancer 20 years ago.  EXAM: CT ABDOMEN AND PELVIS WITHOUT AND WITH CONTRAST IMPRESSION: 1. 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 No evidence of metastatic disease. No gross transmural extension. 2. Moderate gastric antral wall thickening. This could represent gastritis or gastric carcinoma. Endoscopy should be considered. Adjacent porta hepatis nodes are indeterminate. If the patient is diagnosed with gastric carcinoma, nodal metastasis would be a concern. 3. Esophageal air fluid level suggests dysmotility or  gastroesophageal reflux. 4.  Possible constipation. 5. Small volume cul-de-sac fluid. 6. Centrilobular emphysema with nonspecific right lung base nodule.   Electronically Signed   By: Abigail Miyamoto M.D.   On: 11/24/2013 11:55   ASSESSMENT AND PLAN  1.  Chest and Epigastric Pain:  Troponins negative.  Cont to have discomfort assoc with nausea and belching.  Somewhat better with carafate/maalox.  CT yesterday suggested GERD vs dysmotility with antral wall thickening - ? Gastritis vs gastric CA.  Needs GI eval/EGD.   2.  Newly Dx Cardiomyopathy:  EF 20-25% by echo yesterday.  No prior h/o CAD or CHF.  Euvolemic on exam.  Glob HK suggests NICM vs 3VD.  Plan for myoview to r/o ischemic cause.  Further recs pending MV results.  Will consider bb/acei, though reluctant to add bb @ this point in setting of alternating bundle with possible need for pacing as previously outlined by Dr. Caryl Comes.  Will d/w Dr. Meda Coffee.  3. Hematuria/Bladder CA:  CT suggestive of recurrent bladder CA.  Urology seeing.  4.  Palpitations:  Await ischemic eval.  Per Dr. Caryl Comes, would require back up pacing (? ICD if EF </= 35% after 3 mos of therapy).  Pt wishes to understand urologic and GI plans prior to committing to invasive cardiac procedure.  5.  ABL Anemia:  In setting of hematuria.  H/H down again.  Follow and transfuse as necessary.  6.  Tob Abuse:  Cessation advised - receiving nicotine patch.  Signed, Murray Hodgkins NP

## 2013-11-25 NOTE — Progress Notes (Signed)
Handout given to pt and family members NO:IBBCWU test reinforced no eating or drinking after MN

## 2013-11-25 NOTE — Progress Notes (Signed)
Discussed with Dr. Diona Fanti   PROGRESS NOTE  [name] BMW:[id number] DOB: 12-Jul-1940 DOA: 11/22/2013 PCP: Abigail Miyamoto, MD  [name] is a 73 y.o. female with a remote history of bladder cancer (1995) who presents to the ED with complaints of nausea and vomiting x 2 days with substernal chest pain and epigastric pain. She denies any diarrhea, or hematemesis. She reports that she has had palpitations a feeling as if her heart was pounding. She reports that she had nausea and vomiting for 2 days last week and thought it was due to food poisoning  Assessment/Plan: Hyponatremia, hypovolemic Resolving. Now euvolemic. Saline locked.   Atrial ectopy with alternating BBB Echo shows EF 20%. For myoview.  Hematuria Imaging shows multiple masses, likely recurrence of cancer. TURBT within the next 2 weeks after discharge  Chest pain likely GI etiology. MI ruled out  Nausea and Vomiting/epigastric pain/abnormalcT:  CT shows fluid filled esophagus and gastric antral wall thickening. Never had EGD. On empiric carafate and PPI. Had vomiting last night and not eating much. Makes her nauseated. Will add reglan. Consulted [facility]. Likely endo tomorrow.  Iron deficiency anemia: likely from hematuria. stools heme negative. hgb decreased with hydration and hematuria. If less than 7, or symptomatic, will transfuse.  Cardiomyopathy. No evidence for acute CHF. IVF stopped  Code Status: full Family Communication: multiple at bedside Disposition Plan:    Consultants:  Cardiology  Urology  [facility]  Procedures:    HPI/Subjective: Vomited and nauseated last night. Poor appetite. Epigastric pain no better. No dyspnea. No hematuria, hematemesis, melena  Objective: Filed Vitals:   11/25/13 0424  BP: 118/74  Pulse: 68  Temp: 98.6 F (37 C)  Resp: 16    Intake/Output Summary (Last 24 hours) at 11/25/13 1122 Last data filed at 11/25/13 1053  Gross per 24 hour  Intake    103 ml   Output      0 ml  Net    103 ml   Filed Weights   11/22/13 2319 11/23/13 0400  Weight: 55.248 kg (121 lb 12.8 oz) 55.23 kg (121 lb 12.2 oz)    Exam:   General:  A+Ox3, NAD  Cardiovascular: rrr without MGR  Respiratory: clear without WRR  Abdomen: +BS, soft, mild epigastric tenderness.  Ext: no CCE  Data Reviewed: Basic Metabolic Panel:  Recent Labs Lab 11/22/13 1653 11/23/13 0345 11/24/13 0305  NA 123* 127* 132*  K 3.7 3.8 3.8  CL 83* 90* 98  CO2 25 25 25   GLUCOSE 111* 105* 116*  BUN 11 12 14   CREATININE 0.66 0.76 0.60  CALCIUM 10.0 9.1 8.1*   Liver Function Tests:  Recent Labs Lab 11/22/13 1653  AST 26  ALT 14  ALKPHOS 63  BILITOT 0.4  PROT 6.9  ALBUMIN 3.7    Recent Labs Lab 11/23/13 0356  LIPASE 30   No results found for this basename: AMMONIA,  in the last 168 hours CBC:  Recent Labs Lab 11/22/13 1653 11/23/13 0345 11/24/13 0305 11/25/13 0210  WBC 7.2 8.2 6.6  --   HGB 9.2* 9.0* 7.6* 7.5*  HCT 28.7* 29.2* 24.5* 24.5*  MCV 63.6* 66.1* 67.5*  --   PLT 322 314 274  --    Cardiac Enzymes:  Recent Labs Lab 11/22/13 2338 11/23/13 0356 11/23/13 0955  TROPONINI <0.30 <0.30 <0.30   BNP (last 3 results) No results found for this basename: PROBNP,  in the last 8760 hours CBG: No results found for  this basename: GLUCAP,  in the last 168 hours  Recent Results (from the past 240 hour(s))  URINE CULTURE     Status: None   Collection Time    11/23/13  2:26 AM      Result Value Ref Range Status   Specimen Description URINE, RANDOM   Final   Special Requests NONE   Final   Culture  Setup Time     Final   Value: 11/23/2013 10:19     Performed at [facility]     Final   Value: NO GROWTH     Performed at [facility]   Culture     Final   Value: NO GROWTH     Performed at [facility]   Report Status 11/24/2013 FINAL   Final     Studies: Ct Abdomen Pelvis W Wo Contrast  11/24/2013    CLINICAL DATA:  Gross hematuria with history of bladder cancer 20 years ago.  EXAM: CT ABDOMEN AND PELVIS WITHOUT AND WITH CONTRAST  TECHNIQUE: Multidetector CT imaging of the abdomen and pelvis was performed following the standard protocol before and following the bolus administration of intravenous contrast.  CONTRAST:  122mL OMNIPAQUE IOHEXOL 300 MG/ML  SOLN  COMPARISON:  None.  FINDINGS: Lung bases: Centrilobular emphysema. Subpleural right middle lobe nodular density of 4 mm on image 1. Mild cardiomegaly. Dilated, fluid-filled lower esophagus on image 2 of series 301.  Kidneys / Ureters / Bladder: No renal calculi or hydronephrosis. No hydroureter or ureteric calculi. No bladder calculi. No renal mass on post-contrast images. Moderate renal collecting system opacification on delayed images. The ureters are moderately well opacified on delayed images. Portions of the mid right and distal left ureter are incompletely opacified. No filling defects within the opacified portions.  Multiple enhancing bladder masses are identified. 6.0 x 5.3 cm anteriorly and superiorly on image 58 of series 301. A small right posterior bladder nodule at 8 mm on image 59.  Right paracentral dependent bladder base 2.2 x 2.7 cm lesion on image 70 of series 301. More anterior inferior 9 mm nodule on image 72.  No gross transmural extension identified.  Other: Normal liver, spleen. Gastric antral wall thickening on image 74 of series 301. This is moderate. Adjacent porta hepatis nodes, including a 9 mm node on image 30.  Upper normal pancreatic duct size. No cause identified. Normal gallbladder, biliary tract, adrenal glands.  Aortic and branch vessel atherosclerosis. No retroperitoneal or retrocrural adenopathy. Colonic stool burden suggests constipation. Normal small bowel.  No pelvic adenopathy. Normal uterus without adnexal mass. Small volume pelvic cul-de-sac fluid, of indeterminate etiology.  Bones / Musculoskeletal: Moderate  osteopenia. Convex right lumbar spine curvature. Advanced lumbar spondylosis  IMPRESSION: 1. 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 No evidence of metastatic disease. No gross transmural extension. 2. Moderate gastric antral wall thickening. This could represent gastritis or gastric carcinoma. Endoscopy should be considered. Adjacent porta hepatis nodes are indeterminate. If the patient is diagnosed with gastric carcinoma, nodal metastasis would be a concern. 3. Esophageal air fluid level suggests dysmotility or gastroesophageal reflux. 4.  Possible constipation. 5. Small volume cul-de-sac fluid. 6. Centrilobular emphysema with nonspecific right lung base nodule.   Electronically Signed   By: Abigail Miyamoto M.D.   On: 11/24/2013 11:55   Dg Lumbar Spine 2-3 Views  11/23/2013   CLINICAL DATA:  Lumbar spine pain for 1 year radiating to the  buttocks. History of back surgery due to herniated disc  EXAM: LUMBAR SPINE - 2-3 VIEW  COMPARISON:  None.  FINDINGS: There are 5 non rib-bearing lumbar type vertebral bodies.  There is a mild to moderate scoliotic curvature of the thoracolumbar spine with dominant cranial component convex to the right. No definite anterolisthesis or retrolisthesis.  Lumbar vertebral body heights are preserved given obliquity.  There is moderate to severe multilevel lumbar spine DDD, likely worse at L1-L2, L2-L3 and L4-L5 with disc space height loss, endplate irregularity and sclerosis.  Atherosclerotic plaque within the abdominal aorta.  Bowel-gas pattern regional soft tissues appear normal. No radiopaque foreign body.  IMPRESSION: 1. No definite acute findings. 2. Mild to moderate scoliotic curvature of the thoracolumbar spine with associated moderate to severe multilevel lumbar spine DDD.   Electronically Signed   By: Sandi Mariscal M.D.   On: 11/23/2013 15:02   Echo Left ventricle: The cavity size was mildly dilated. Wall thickness was  normal. Systolic function was severely reduced. The estimated ejection fraction was in the range of 20% to 25%. Diffuse hypokinesis. Doppler parameters are consistent with abnormal left ventricular relaxation (grade 1 diastolic dysfunction). - Mitral valve: There was mild regurgitation. - Left atrium: The atrium was severely dilated. - Pulmonary arteries: Systolic pressure was mildly increased. PA peak pressure: 41 mm Hg (S). - Pericardium, extracardiac: A small pericardial effusion was identified.  Impressions:  - Severe global reduction in LV function; grade 1 diastolic dysfunction; mild MR and TR; mildly elevated pulmonary pressures; small pericardial effusion.   Scheduled Meds: . metoCLOPramide (REGLAN) injection  5 mg Intravenous TID AC & HS  . nicotine  14 mg Transdermal Daily  . pantoprazole  40 mg Oral BID  . sodium chloride  3 mL Intravenous Q12H  . sucralfate  1 g Oral 4 times per day   Continuous Infusions: . sodium chloride Stopped (11/22/13 1802)   Antibiotics Given (last 72 hours)   Date/Time Action Medication Dose Rate   11/23/13 1556 Given   cefTRIAXone (ROCEPHIN) 1 g in dextrose 5 % 50 mL IVPB 1 g 100 mL/hr   11/24/13 1418 Given   cefTRIAXone (ROCEPHIN) 1 g in dextrose 5 % 50 mL IVPB 1 g 100 mL/hr     Time spent: 35 min  [facility] Pager [phone]. If 7PM-7AM, please contact night-coverage at [email or web address], password [facility] 11/25/2013, 11:22 AM  LOS: 3 days

## 2013-11-25 NOTE — Consult Note (Signed)
Subjective:   HPI  The patient is a 73 year old female who presented to the hospital with complaints of epigastric pain nausea and vomiting. She also had some chest pain.She has been seen by cardio and they are planning a stress test tomorrow. We are asked to see her in regards to the nausea vomiting and epigastric pain, and also because of an abnormal CT scan which showed moderate gastric antral wall thickening which could be either gastritis or gastric malignancy.She was heme-negative on admission. She has not vomited blood or  seen blood in the stool.her hemoglobin was 9.2 on admission .her hemoglobin today was 7.5.  Review of Systems Currently no chest pain  Or shortness of breath  Past Medical History  Diagnosis Date  . LBBB (left bundle branch block)   . Bladder cancer 1995  . Ectopic atrial beats    Past Surgical History  Procedure Laterality Date  . Back surgery     History   Social History  . Marital Status: Widowed    Spouse Name: N/A    Number of Children: N/A  . Years of Education: N/A   Occupational History  . Not on file.   Social History Main Topics  . Smoking status: Current Every Day Smoker -- 0.50 packs/day  . Smokeless tobacco: Not on file  . Alcohol Use: No  . Drug Use: Not on file  . Sexual Activity: Not on file   Other Topics Concern  . Not on file   Social History Narrative  . No narrative on file   family history is not on file. Current facility-administered medications:0.9 %  sodium chloride infusion, , Intravenous, Continuous, Threasa Beards, MD;  acetaminophen (TYLENOL) suppository 650 mg, 650 mg, Rectal, Q6H PRN, Theressa Millard, MD;  acetaminophen (TYLENOL) tablet 650 mg, 650 mg, Oral, Q6H PRN, Theressa Millard, MD alum & mag hydroxide-simeth (MAALOX/MYLANTA) 200-200-20 MG/5ML suspension 30 mL, 30 mL, Oral, Q6H PRN, Theressa Millard, MD, 30 mL at 11/24/13 1602;  HYDROmorphone (DILAUDID) injection 0.5-1 mg, 0.5-1 mg, Intravenous, Q3H  PRN, Theressa Millard, MD, 1 mg at 11/25/13 0330;  metoCLOPramide (REGLAN) injection 5 mg, 5 mg, Intravenous, TID AC & HS, Delfina Redwood, MD nicotine (NICODERM CQ - dosed in mg/24 hours) patch 14 mg, 14 mg, Transdermal, Daily, Theressa Millard, MD, 14 mg at 11/25/13 1053;  ondansetron (ZOFRAN) injection 4 mg, 4 mg, Intravenous, Q6H PRN, Theressa Millard, MD;  ondansetron (ZOFRAN) tablet 4 mg, 4 mg, Oral, Q6H PRN, Theressa Millard, MD, 4 mg at 11/24/13 0857 oxyCODONE (Oxy IR/ROXICODONE) immediate release tablet 5 mg, 5 mg, Oral, Q4H PRN, Theressa Millard, MD, 5 mg at 11/24/13 1727;  pantoprazole (PROTONIX) EC tablet 40 mg, 40 mg, Oral, BID, Delfina Redwood, MD, 40 mg at 11/25/13 1053;  sodium chloride 0.9 % injection 3 mL, 3 mL, Intravenous, Q12H, Theressa Millard, MD, 3 mL at 11/25/13 1053 sucralfate (CARAFATE) 1 GM/10ML suspension 1 g, 1 g, Oral, 4 times per day, Theressa Millard, MD, 1 g at 11/25/13 1200 No Known Allergies   Objective:     BP 120/78  Pulse 59  Temp(Src) 98.4 F (36.9 C) (Oral)  Resp 18  Ht 5\' 8"  (1.727 m)  Wt 55.23 kg (121 lb 12.2 oz)  BMI 18.52 kg/m2  SpO2 97%  She is in no distress Heart regular rhythm  Lungs clear  Abdomen: Bowel sounds normal, soft tender in the epigastrium  Laboratory No components found with this  basename: d1      Assessment:     Epigastric pain with nausea vomiting and an abnormal CT scan as mentioned above.      Plan:     We will plan EGD. She is going to have a stress test tomorrow. We will wait for the results and make sure we get cardiac clearance.     Component Value Date/Time   WBC 6.6 11/24/2013 0305   HGB 7.5* 11/25/2013 0210   HCT 24.5* 11/25/2013 0210   PLT 274 11/24/2013 0305   ALT 14 11/22/2013 1653   AST 26 11/22/2013 1653   NA 132* 11/24/2013 0305   K 3.8 11/24/2013 0305   CL 98 11/24/2013 0305   CREATININE 0.60 11/24/2013 0305   BUN 14 11/24/2013 0305   CO2 25 11/24/2013 0305   CALCIUM 8.1* 11/24/2013 0305    ALKPHOS 63 11/22/2013 1653    Lab Results  Component Value Date   HGB 7.5* 11/25/2013   HGB 7.6* 11/24/2013   HGB 9.0* 11/23/2013   HCT 24.5* 11/25/2013   HCT 24.5* 11/24/2013   HCT 29.2* 11/23/2013   ALKPHOS 63 11/22/2013   ALKPHOS 94 08/31/2010   AST 26 11/22/2013   AST 24 08/31/2010   ALT 14 11/22/2013   ALT 13 08/31/2010

## 2013-11-25 NOTE — Care Management Note (Signed)
    Page 1 of 2   11/29/2013     3:55:45 PM CARE MANAGEMENT NOTE 11/29/2013  Patient:  [name], [name]   Account Number:  [id number]  Date Initiated:  11/25/2013  Documentation initiated by:  GRAVES-BIGELOW,Kimbella Heisler  Subjective/Objective Assessment:   Pt admitted for cp and weakness. Stress test 11-25-13.     Action/Plan:   CM to  monitor for disposition needs.   Anticipated DC Date:  11/26/2013   Anticipated DC Plan:  [place]  CM consult      Choice offered to / List presented to:  C-1 Patient        [place] arranged  HH-1 RN  HH-10 DISEASE MANAGEMENT  HH-2 PT  HH-3 OT  [place]      [place] agency  [facility]   Status of service:  Completed, signed off Medicare Important Message given?  YES (If response is "NO", the following Medicare IM given date fields will be blank) Date Medicare IM given:  11/25/2013 Medicare IM given by:  GRAVES-BIGELOW,Sarahjane Matherly Date Additional Medicare IM given:  11/28/2013 Additional Medicare IM given by:  [place]  Discharge Disposition:  [place]  Per UR Regulation:  Reviewed for med. necessity/level of care/duration of stay  If discussed at [place] of Stay Meetings, dates discussed:   11/28/2013    Comments:   11-29-13 1554 Jacqlyn Krauss, RN,BSN [phone] CM did make referral to [facility] for services. SOC to begin within 24-48 hours of d/c.   11-27-13 1419 Jacqlyn Krauss, RN,BSN [phone] Plan for EGD 11-28-13. CM will continue to monitor for disposition needs.

## 2013-11-25 NOTE — Progress Notes (Signed)
TC from nuc med reporting pt drank coffee with milk in it this AM. Test cancelled rescheduled for tomorrow am.

## 2013-11-26 ENCOUNTER — Other Ambulatory Visit (HOSPITAL_COMMUNITY): Payer: Medicare Other

## 2013-11-26 ENCOUNTER — Encounter (HOSPITAL_COMMUNITY): Payer: Medicare Other

## 2013-11-26 ENCOUNTER — Inpatient Hospital Stay (HOSPITAL_COMMUNITY): Payer: Medicare Other

## 2013-11-26 DIAGNOSIS — R079 Chest pain, unspecified: Secondary | ICD-10-CM

## 2013-11-26 DIAGNOSIS — I499 Cardiac arrhythmia, unspecified: Secondary | ICD-10-CM

## 2013-11-26 DIAGNOSIS — C679 Malignant neoplasm of bladder, unspecified: Principal | ICD-10-CM

## 2013-11-26 LAB — CBC
HCT: 25.4 % — ABNORMAL LOW (ref 36.0–46.0)
Hemoglobin: 7.6 g/dL — ABNORMAL LOW (ref 12.0–15.0)
MCH: 20.3 pg — ABNORMAL LOW (ref 26.0–34.0)
MCHC: 29.9 g/dL — ABNORMAL LOW (ref 30.0–36.0)
MCV: 67.9 fL — ABNORMAL LOW (ref 78.0–100.0)
PLATELETS: 288 10*3/uL (ref 150–400)
RBC: 3.74 MIL/uL — ABNORMAL LOW (ref 3.87–5.11)
RDW: 18.8 % — ABNORMAL HIGH (ref 11.5–15.5)
WBC: 6.9 10*3/uL (ref 4.0–10.5)

## 2013-11-26 LAB — BASIC METABOLIC PANEL
Anion gap: 12 (ref 5–15)
BUN: 12 mg/dL (ref 6–23)
CO2: 26 mEq/L (ref 19–32)
Calcium: 8.7 mg/dL (ref 8.4–10.5)
Chloride: 97 mEq/L (ref 96–112)
Creatinine, Ser: 0.61 mg/dL (ref 0.50–1.10)
GFR, EST NON AFRICAN AMERICAN: 87 mL/min — AB (ref >90)
GLUCOSE: 87 mg/dL (ref 70–99)
POTASSIUM: 4.3 meq/L (ref 3.7–5.3)
SODIUM: 135 meq/L — AB (ref 137–147)

## 2013-11-26 MED ORDER — TECHNETIUM TC 99M SESTAMIBI GENERIC - CARDIOLITE
30.0000 | Freq: Once | INTRAVENOUS | Status: AC | PRN
  Administered 2013-11-26: 30 via INTRAVENOUS

## 2013-11-26 MED ORDER — LISINOPRIL 2.5 MG PO TABS
2.5000 mg | ORAL_TABLET | Freq: Every day | ORAL | Status: DC
  Administered 2013-11-26 – 2013-11-28 (×3): 2.5 mg via ORAL
  Filled 2013-11-26 (×4): qty 1

## 2013-11-26 MED ORDER — REGADENOSON 0.4 MG/5ML IV SOLN
0.4000 mg | Freq: Once | INTRAVENOUS | Status: AC
  Administered 2013-11-26: 0.4 mg via INTRAVENOUS
  Filled 2013-11-26: qty 5

## 2013-11-26 MED ORDER — TECHNETIUM TC 99M SESTAMIBI GENERIC - CARDIOLITE
10.0000 | Freq: Once | INTRAVENOUS | Status: AC | PRN
  Administered 2013-11-26: 10 via INTRAVENOUS

## 2013-11-26 MED ORDER — REGADENOSON 0.4 MG/5ML IV SOLN
INTRAVENOUS | Status: AC
  Filled 2013-11-26: qty 5

## 2013-11-26 MED ORDER — METOCLOPRAMIDE HCL 5 MG PO TABS
5.0000 mg | ORAL_TABLET | Freq: Three times a day (TID) | ORAL | Status: DC
  Administered 2013-11-26 – 2013-11-29 (×8): 5 mg via ORAL
  Filled 2013-11-26 (×11): qty 1

## 2013-11-26 NOTE — Clinical Documentation Improvement (Signed)
PLEASE SPECIFY TYPE & ACUITY OF CHF: Possible Clinical Conditions?  Chronic Systolic Congestive Heart Failure Chronic Diastolic Congestive Heart Failure Chronic Systolic & Diastolic Congestive Heart Failure Acute Systolic Congestive Heart Failure Acute Diastolic Congestive Heart Failure Acute Systolic & Diastolic Congestive Heart Failure Acute on Chronic Systolic Congestive Heart Failure Acute on Chronic Diastolic Congestive Heart Failure Acute on Chronic Systolic & Diastolic Congestive Heart Failure Other Condition Cannot Clinically Determine  Supporting Information: (As per notes) " - Severe global reduction in LV function; grade 1 diastolic dysfunction; mild MR and TR; mildly elevated pulmonary pressures; small pericardial effusion." "Continue medical Rx for CHF. Also has significant anemia. Consider outpatient f/u in CHF clinic"    Thank You, Alessandra Grout, RN, BSN, CCDS,Clinical Documentation Specialist:  [phone]  [phone]=[facility] [place]- Health Information Management

## 2013-11-26 NOTE — Progress Notes (Signed)
UR Completed Ledon Weihe Graves-Bigelow, RN,BSN [phone]  

## 2013-11-26 NOTE — Progress Notes (Deleted)
[name] 11/26/2013

## 2013-11-26 NOTE — Progress Notes (Addendum)
Patient Name: [name] Date of Encounter: 11/26/2013   Principal Problem:   Hyponatremia Active Problems:   Palpitations   Anemia, iron deficiency   Chest pain   Epigastric pain   Bladder cancer   Hematuria   LBBB (left bundle branch block)   Atrial ectopy   Congestive dilated cardiomyopathy    SUBJECTIVE No more chest pain, nausea.   CURRENT MEDS . metoCLOPramide (REGLAN) injection  5 mg Intravenous TID AC & HS  . nicotine  14 mg Transdermal Daily  . pantoprazole  40 mg Oral BID  . regadenoson      . sodium chloride  3 mL Intravenous Q12H  . sucralfate  1 g Oral 4 times per day   OBJECTIVE  Filed Vitals:   11/25/13 1339 11/25/13 2002 11/26/13 0501 11/26/13 0903  BP: 120/78 140/86 132/86 151/94  Pulse: 59 66 62   Temp: 98.4 F (36.9 C) 99.9 F (37.7 C) 98.8 F (37.1 C)   TempSrc: Oral Oral Oral   Resp: 18 18 18    Height:      Weight:   124 lb 6.4 oz (56.427 kg)   SpO2: 97% 99% 99%     Intake/Output Summary (Last 24 hours) at 11/26/13 0933 Last data filed at 11/25/13 1053  Gross per 24 hour  Intake      3 ml  Output      0 ml  Net      3 ml   Filed Weights   11/22/13 2319 11/23/13 0400 11/26/13 0501  Weight: 121 lb 12.8 oz (55.248 kg) 121 lb 12.2 oz (55.23 kg) 124 lb 6.4 oz (56.427 kg)    PHYSICAL EXAM  General: Pleasant, NAD. Neuro: Alert and oriented X 3. Moves all extremities spontaneously. Psych: Normal affect. HEENT:  Normal  Neck: Supple without bruits or JVD. Lungs:  Resp regular and unlabored, CTA. Heart: RRR no s3, s4, or murmurs. Abdomen: Soft, non-tender, non-distended, BS + x 4.  Extremities: No clubbing, cyanosis or edema. DP/PT/Radials 2+ and equal bilaterally.  Accessory Clinical Findings CBC  Recent Labs  11/24/13 0305 11/25/13 0210 11/26/13 0459  WBC 6.6  --  6.9  HGB 7.6* 7.5* 7.6*  HCT 24.5* 24.5* 25.4*  MCV 67.5*  --  67.9*  PLT 274  --  854   Basic Metabolic Panel  Recent Labs  11/24/13 0305  11/26/13 0459  NA 132* 135*  K 3.8 4.3  CL 98 97  CO2 25 26  GLUCOSE 116* 87  BUN 14 12  CREATININE 0.60 0.61  CALCIUM 8.1* 8.7   Cardiac Enzymes  Recent Labs  11/23/13 0955  TROPONINI <0.30   TELE  Sinus rhythm, freq pac's, lbbb, rare pvc.  2D Echocardiogram 9.6.2015  Study Conclusions  - Left ventricle: The cavity size was mildly dilated. Wall   thickness was normal. Systolic function was severely reduced. The   estimated ejection fraction was in the range of 20% to 25%.   Diffuse hypokinesis. Doppler parameters are consistent with   abnormal left ventricular relaxation (grade 1 diastolic   dysfunction). - Mitral valve: There was mild regurgitation. - Left atrium: The atrium was severely dilated. - Pulmonary arteries: Systolic pressure was mildly increased. PA   peak pressure: 41 mm Hg (S). - Pericardium, extracardiac: A small pericardial effusion was   identified. _____________   Radiology/Studies  Ct Abdomen Pelvis W Wo Contrast  11/24/2013   CLINICAL DATA:  Gross hematuria with history of bladder cancer 20  years ago.  EXAM: CT ABDOMEN AND PELVIS WITHOUT AND WITH CONTRAST IMPRESSION: 1. 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 No evidence of metastatic disease. No gross transmural extension. 2. Moderate gastric antral wall thickening. This could represent gastritis or gastric carcinoma. Endoscopy should be considered. Adjacent porta hepatis nodes are indeterminate. If the patient is diagnosed with gastric carcinoma, nodal metastasis would be a concern. 3. Esophageal air fluid level suggests dysmotility or gastroesophageal reflux. 4.  Possible constipation. 5. Small volume cul-de-sac fluid. 6. Centrilobular emphysema with nonspecific right lung base nodule.   Electronically Signed   By: Abigail Miyamoto M.D.   On: 11/24/2013 11:55     ASSESSMENT AND PLAN  1.  Chest and Epigastric Pain:  Troponins negative.   CT  yesterday suggested GERD vs dysmotility with antral wall thickening - ? Gastritis vs gastric CA.  Needs GI eval/EGD.   2.  Newly Dx Cardiomyopathy:  EF 20-25% by echo.  No prior h/o CAD or CHF.  Euvolemic on exam.   Lexiscan stress test showed no significant perfusion defect - no ischemia, with global LV dysfunction, LVEF 28%, consistent with non-ischemic cardiomyopathy. The patient is currently euvolemic.   Considering degree of LV dysfunction the patient is considered a moderate risk for an intermediate risk surgery (bladder surgery).   We are reluctant to add betablockers at this point in setting of alternating bundle and bradycardia with possible need for pacing as previously outlined by Dr. Caryl Comes.  We will start a low dose lisinopril 2.5 mg po daily. We will follow.  3. Hematuria/Bladder CA:  CT suggestive of recurrent bladder CA.  Urology seeing. Most probably recurrent bladder ca.   4.  Palpitations: very frequent PACs and short runs of atrial tachycardia, no ischemia.  Per Dr. Caryl Comes, would require back up pacing (? ICD if EF </= 35% after 3 mos of therapy).  Pt wishes to understand urologic and GI plans prior to committing to invasive cardiac procedure.  5.  Microcytis Anemia:  In setting of hematuria.  H/H down again.  Follow and transfuse as necessary.  6.  Tob Abuse:  Cessation advised - receiving nicotine patch.  Corliss Blacker 11/26/2013

## 2013-11-26 NOTE — Progress Notes (Addendum)
Discussed with Dr. Diona Fanti   PROGRESS NOTE  GRETCHEN WEINFELD YOV:[id number] DOB: [birth year] DOA: 11/22/2013 PCP: Abigail Miyamoto, MD  [name] is a 73 y.o. female with a remote history of bladder cancer (1995) who presents to the ED with complaints of nausea and vomiting x 2 days with substernal chest pain and epigastric pain. She denies any diarrhea, or hematemesis. She reports that she has had palpitations a feeling as if her heart was pounding. She reports that she had nausea and vomiting for 2 days last week and thought it was due to food poisoning  Assessment/Plan: Hyponatremia, hypovolemic Resolving. Now euvolemic. Saline locked.   Atrial ectopy with alternating BBB Echo shows EF 20%. Per Dr. Meda Coffee, who overread Dr. Maximino Sarin reading of myoview no significant perfusion defect, no ischemia with global LV dysfunction, consistent with nonischemic cardiomyopathy.  Hematuria Imaging shows multiple masses, likely recurrence of cancer. TURBT within the next 2 weeks after discharge  Nausea and Vomiting/epigastric pain/abnormalcT:  CT shows fluid filled esophagus and gastric antral wall thickening. Never had EGD. On empiric carafate, PPI, reglan. Symptoms improving. Cleared by cardiology to proceed with EGD.  Iron deficiency anemia: likely from hematuria. stools heme negative. hgb decreased with hydration and hematuria. If less than 7, or symptomatic, will transfuse.  Cardiomyopathy. No evidence for acute CHF. Started on ACE inhibitor  Code Status: full Family Communication: daughter extensive discussion about test results, plans, medications, etc. Disposition Plan:    Consultants:  Cardiology  Urology  [facility]  Procedures:    HPI/Subjective: Pain and vomiting improving. Able to eat better. No dyspnea.  Objective: Filed Vitals:   11/26/13 1355  BP: 133/86  Pulse: 69  Temp: 98.1 F (36.7 C)  Resp: 18   No intake or output data in the 24 hours ending 11/26/13  1516 Filed Weights   11/22/13 2319 11/23/13 0400 11/26/13 0501  Weight: 55.248 kg (121 lb 12.8 oz) 55.23 kg (121 lb 12.2 oz) 56.427 kg (124 lb 6.4 oz)    Exam:   General:  A+Ox3, NAD  Cardiovascular: rrr without MGR  Respiratory: clear without WRR  Abdomen: +BS, soft, nontender  Ext: no CCE  Data Reviewed: Basic Metabolic Panel:  Recent Labs Lab 11/22/13 1653 11/23/13 0345 11/24/13 0305 11/26/13 0459  NA 123* 127* 132* 135*  K 3.7 3.8 3.8 4.3  CL 83* 90* 98 97  CO2 25 25 25 26   GLUCOSE 111* 105* 116* 87  BUN 11 12 14 12   CREATININE 0.66 0.76 0.60 0.61  CALCIUM 10.0 9.1 8.1* 8.7   Liver Function Tests:  Recent Labs Lab 11/22/13 1653  AST 26  ALT 14  ALKPHOS 63  BILITOT 0.4  PROT 6.9  ALBUMIN 3.7    Recent Labs Lab 11/23/13 0356  LIPASE 30   No results found for this basename: AMMONIA,  in the last 168 hours CBC:  Recent Labs Lab 11/22/13 1653 11/23/13 0345 11/24/13 0305 11/25/13 0210 11/26/13 0459  WBC 7.2 8.2 6.6  --  6.9  HGB 9.2* 9.0* 7.6* 7.5* 7.6*  HCT 28.7* 29.2* 24.5* 24.5* 25.4*  MCV 63.6* 66.1* 67.5*  --  67.9*  PLT 322 314 274  --  288   Cardiac Enzymes:  Recent Labs Lab 11/22/13 2338 11/23/13 0356 11/23/13 0955  TROPONINI <0.30 <0.30 <0.30   BNP (last 3 results) No results found for this basename: PROBNP,  in the last 8760 hours CBG: No results found for this basename: GLUCAP,  in the last  168 hours  Recent Results (from the past 240 hour(s))  URINE CULTURE     Status: None   Collection Time    11/23/13  2:26 AM      Result Value Ref Range Status   Specimen Description URINE, RANDOM   Final   Special Requests NONE   Final   Culture  Setup Time     Final   Value: 11/23/2013 10:19     Performed at [place]     Final   Value: NO GROWTH     Performed at [facility]   Culture     Final   Value: NO GROWTH     Performed at [facility]   Report Status 11/24/2013 FINAL    Final     Studies: Nm Myocar Multi W/spect W/wall Motion / Ef  11/26/2013   CLINICAL DATA:  Chest pain.  Left bundle branch block.  EXAM: MYOCARDIAL IMAGING WITH SPECT (REST AND PHARMACOLOGIC-STRESS - 2 DAY PROTOCOL)  GATED LEFT VENTRICULAR WALL MOTION STUDY  LEFT VENTRICULAR EJECTION FRACTION  TECHNIQUE: Standard myocardial SPECT imaging was performed after resting intravenous injection of 10 mCi Tc-24m sestamibi. Subsequently, on a second day, intravenous infusion of Lexiscan was performed under the supervision of the Cardiology staff. At peak effect of the drug, 30 mCi Tc-42m sestamibi was injected intravenously and standard myocardial SPECT imaging was performed. Quantitative gated imaging was also performed to evaluate left ventricular wall motion, and estimate left ventricular ejection fraction.  COMPARISON:  None.  FINDINGS: Perfusion: Large high severity inferior wall perfusion defect on stress and rest images. Large Mild inferior severity anteroseptal wall hypoperfusion on stress and rest images. No inducible ischemia.  Wall Motion: Global moderate hypokinesis.  Left Ventricular Ejection Fraction: 67% %  End diastolic volume 124 ml  End systolic volume 580 ml  IMPRESSION: 1. Large high severity inferior wall perfusion defect on stress and rest images. Large Mild severity anteroseptal wall perfusion defect on stress and rest images. No inducible ischemia.  2. Global moderate hypokinesis.  3. Left ventricular ejection fraction 28%%  4. High-risk stress test findings*.  *2012 Appropriate Use Criteria for Coronary Revascularization Focused Update: J Am Coll Cardiol. 9983;38(2):505-397. http://content.airportbarriers.com.aspx?articleid=1201161   Electronically Signed   By: Sherryl Barters M.D.   On: 11/26/2013 11:43   Echo Left ventricle: The cavity size was mildly dilated. Wall thickness was normal. Systolic function was severely reduced. The estimated ejection fraction was in the range of 20% to  25%. Diffuse hypokinesis. Doppler parameters are consistent with abnormal left ventricular relaxation (grade 1 diastolic dysfunction). - Mitral valve: There was mild regurgitation. - Left atrium: The atrium was severely dilated. - Pulmonary arteries: Systolic pressure was mildly increased. PA peak pressure: 41 mm Hg (S). - Pericardium, extracardiac: A small pericardial effusion was identified.  Impressions:  - Severe global reduction in LV function; grade 1 diastolic dysfunction; mild MR and TR; mildly elevated pulmonary pressures; small pericardial effusion.   Scheduled Meds: . lisinopril  2.5 mg Oral Daily  . metoCLOPramide  5 mg Oral TID AC  . nicotine  14 mg Transdermal Daily  . pantoprazole  40 mg Oral BID  . regadenoson      . sodium chloride  3 mL Intravenous Q12H  . sucralfate  1 g Oral 4 times per day   Continuous Infusions: . sodium chloride Stopped (11/22/13 1802)   Antibiotics Given (last 72 hours)   Date/Time Action Medication Dose Rate  11/23/13 1556 Given   cefTRIAXone (ROCEPHIN) 1 g in dextrose 5 % 50 mL IVPB 1 g 100 mL/hr   11/24/13 1418 Given   cefTRIAXone (ROCEPHIN) 1 g in dextrose 5 % 50 mL IVPB 1 g 100 mL/hr     Time spent: 55 min  [place] Pager [phone]. If 7PM-7AM, please contact night-coverage at [email or web address], password [facility] 11/26/2013, 3:16 PM  LOS: 4 days

## 2013-11-26 NOTE — Progress Notes (Signed)
Patient Name: [name] Date of Encounter: 11/26/2013     Principal Problem:   Hyponatremia Active Problems:   Palpitations   Anemia, iron deficiency   Chest pain   Epigastric pain   Bladder cancer   Hematuria   LBBB (left bundle branch block)   Atrial ectopy   Congestive dilated cardiomyopathy    SUBJECTIVE  No Chest pain or SOB last night. Mild GI discomfort, no significant nausea  CURRENT MEDS . metoCLOPramide (REGLAN) injection  5 mg Intravenous TID AC & HS  . nicotine  14 mg Transdermal Daily  . pantoprazole  40 mg Oral BID  . sodium chloride  3 mL Intravenous Q12H  . sucralfate  1 g Oral 4 times per day    OBJECTIVE  Filed Vitals:   11/25/13 0424 11/25/13 1339 11/25/13 2002 11/26/13 0501  BP: 118/74 120/78 140/86 132/86  Pulse: 68 59 66 62  Temp: 98.6 F (37 C) 98.4 F (36.9 C) 99.9 F (37.7 C) 98.8 F (37.1 C)  TempSrc: Oral Oral Oral Oral  Resp: 16 18 18 18   Height:      Weight:    124 lb 6.4 oz (56.427 kg)  SpO2: 96% 97% 99% 99%    Intake/Output Summary (Last 24 hours) at 11/26/13 0741 Last data filed at 11/25/13 1053  Gross per 24 hour  Intake      3 ml  Output      0 ml  Net      3 ml   Filed Weights   11/22/13 2319 11/23/13 0400 11/26/13 0501  Weight: 121 lb 12.8 oz (55.248 kg) 121 lb 12.2 oz (55.23 kg) 124 lb 6.4 oz (56.427 kg)    PHYSICAL EXAM  General: Pleasant, NAD. Neuro: Alert and oriented X 3. Moves all extremities spontaneously. Psych: Normal affect. HEENT:  Normal  Neck: Supple without bruits or JVD. Lungs:  Resp regular and unlabored, CTA. Heart: RRR no s3, s4, or murmurs. Abdomen: Soft, non-tender, non-distended, BS + x 4.  Extremities: No clubbing, cyanosis or edema. DP/PT/Radials 2+ and equal bilaterally.  Accessory Clinical Findings  CBC  Recent Labs  11/24/13 0305 11/25/13 0210 11/26/13 0459  WBC 6.6  --  6.9  HGB 7.6* 7.5* 7.6*  HCT 24.5* 24.5* 25.4*  MCV 67.5*  --  67.9*  PLT 274  --  774    Basic Metabolic Panel  Recent Labs  11/24/13 0305 11/26/13 0459  NA 132* 135*  K 3.8 4.3  CL 98 97  CO2 25 26  GLUCOSE 116* 87  BUN 14 12  CREATININE 0.60 0.61  CALCIUM 8.1* 8.7   Cardiac Enzymes  Recent Labs  11/23/13 0955  TROPONINI <0.30    TELE NSR with LBBB and frequent PACs on tele, HR 70s    ECG  NSR with HR 60s, LBBB, PACs  Echocardiogram  LV EF: 20% - 25%  ------------------------------------------------------------------- Indications: Dyspnea 786.09.  ------------------------------------------------------------------- History: PMH: Palpitations. Chest pain.  ------------------------------------------------------------------- Study Conclusions  - Left ventricle: The cavity size was mildly dilated. Wall thickness was normal. Systolic function was severely reduced. The estimated ejection fraction was in the range of 20% to 25%. Diffuse hypokinesis. Doppler parameters are consistent with abnormal left ventricular relaxation (grade 1 diastolic dysfunction). - Mitral valve: There was mild regurgitation. - Left atrium: The atrium was severely dilated. - Pulmonary arteries: Systolic pressure was mildly increased. PA peak pressure: 41 mm Hg (S). - Pericardium, extracardiac: A small pericardial effusion was identified.  Impressions:  -  Severe global reduction in LV function; grade 1 diastolic dysfunction; mild MR and TR; mildly elevated pulmonary pressures; small pericardial effusion.      Radiology/Studies  Ct Abdomen Pelvis W Wo Contrast  11/24/2013   CLINICAL DATA:  Gross hematuria with history of bladder cancer 20 years ago.  EXAM: CT ABDOMEN AND PELVIS WITHOUT AND WITH CONTRAST  TECHNIQUE: Multidetector CT imaging of the abdomen and pelvis was performed following the standard protocol before and following the bolus administration of intravenous contrast.  CONTRAST:  144mL OMNIPAQUE IOHEXOL 300 MG/ML  SOLN  COMPARISON:  None.  FINDINGS: Lung  bases: Centrilobular emphysema. Subpleural right middle lobe nodular density of 4 mm on image 1. Mild cardiomegaly. Dilated, fluid-filled lower esophagus on image 2 of series 301.  Kidneys / Ureters / Bladder: No renal calculi or hydronephrosis. No hydroureter or ureteric calculi. No bladder calculi. No renal mass on post-contrast images. Moderate renal collecting system opacification on delayed images. The ureters are moderately well opacified on delayed images. Portions of the mid right and distal left ureter are incompletely opacified. No filling defects within the opacified portions.  Multiple enhancing bladder masses are identified. 6.0 x 5.3 cm anteriorly and superiorly on image 58 of series 301. A small right posterior bladder nodule at 8 mm on image 59.  Right paracentral dependent bladder base 2.2 x 2.7 cm lesion on image 70 of series 301. More anterior inferior 9 mm nodule on image 72.  No gross transmural extension identified.  Other: Normal liver, spleen. Gastric antral wall thickening on image 74 of series 301. This is moderate. Adjacent porta hepatis nodes, including a 9 mm node on image 30.  Upper normal pancreatic duct size. No cause identified. Normal gallbladder, biliary tract, adrenal glands.  Aortic and branch vessel atherosclerosis. No retroperitoneal or retrocrural adenopathy. Colonic stool burden suggests constipation. Normal small bowel.  No pelvic adenopathy. Normal uterus without adnexal mass. Small volume pelvic cul-de-sac fluid, of indeterminate etiology.  Bones / Musculoskeletal: Moderate osteopenia. Convex right lumbar spine curvature. Advanced lumbar spondylosis  IMPRESSION: 1. 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 No evidence of metastatic disease. No gross transmural extension. 2. Moderate gastric antral wall thickening. This could represent gastritis or gastric carcinoma. Endoscopy should be considered. Adjacent porta  hepatis nodes are indeterminate. If the patient is diagnosed with gastric carcinoma, nodal metastasis would be a concern. 3. Esophageal air fluid level suggests dysmotility or gastroesophageal reflux. 4.  Possible constipation. 5. Small volume cul-de-sac fluid. 6. Centrilobular emphysema with nonspecific right lung base nodule.   Electronically Signed   By: Abigail Miyamoto M.D.   On: 11/24/2013 11:55   Dg Chest 2 View  11/22/2013   CLINICAL DATA:  Nausea and vomiting.  Sternal and epigastric pain.  EXAM: CHEST - 2 VIEW  COMPARISON:  08/31/2010  FINDINGS: The heart size and mediastinal contours are within normal limits. Stable COPD with mild bilateral pulmonary hyperinflation. There is no evidence of pulmonary edema, consolidation, pneumothorax, nodule or pleural fluid. The visualized skeletal structures are unremarkable.  IMPRESSION: Stable COPD.  No active disease.   Electronically Signed   By: Aletta Edouard M.D.   On: 11/22/2013 19:03   Dg Lumbar Spine 2-3 Views  11/23/2013   CLINICAL DATA:  Lumbar spine pain for 1 year radiating to the buttocks. History of back surgery due to herniated disc  EXAM: LUMBAR SPINE - 2-3 VIEW  COMPARISON:  None.  FINDINGS: There are 5 non rib-bearing  lumbar type vertebral bodies.  There is a mild to moderate scoliotic curvature of the thoracolumbar spine with dominant cranial component convex to the right. No definite anterolisthesis or retrolisthesis.  Lumbar vertebral body heights are preserved given obliquity.  There is moderate to severe multilevel lumbar spine DDD, likely worse at L1-L2, L2-L3 and L4-L5 with disc space height loss, endplate irregularity and sclerosis.  Atherosclerotic plaque within the abdominal aorta.  Bowel-gas pattern regional soft tissues appear normal. No radiopaque foreign body.  IMPRESSION: 1. No definite acute findings. 2. Mild to moderate scoliotic curvature of the thoracolumbar spine with associated moderate to severe multilevel lumbar spine DDD.    Electronically Signed   By: Sandi Mariscal M.D.   On: 11/23/2013 15:02    ASSESSMENT AND PLAN  1. Chest and epigastric pain:  - GI consulted to evaluate, moderate gastric antral wall thickening, gastritis vs gastric carcinoma. Esophageal air fluid level suggest dysmotility or GI reflux  - planning EGD after stress test  2. Newly diagnosed cardiomyopathy  - Echo 20-25%. No prior h/o CAD and CHF  - plan for Lexiscan stress test today (was not done yesterday as patient drank full cup of coffee before going down for lexiscan stress)  - reluctant to add BB in setting of alternating bundle with possible need for pacing previously outlined per Dr Caryl Comes. Unclear if alternating bundle branch block will be an issue during stress test  - consider ICD if EF <35% in 3 month  3. 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 Palpitation  5. Anemia: monitor, transfuse if necessary  6. Tobacco abuse  Signed, [name] PA-C Pager: [phone]

## 2013-11-26 NOTE — Progress Notes (Signed)
Patient ID: ZEPHYR SAUSEDO, female   DOB: [birth year], 73 y.o.   MRN: [id number]   I discussed with patient's daughter today: I reviewed CT scan. There are multiple bladder tumors - I counted about 7. The largest at the dome at about 6 cm and appears broad based. Resection will be difficult, take multiple procedures to get stage and grade accurate. There is significant risk of bladder perforation with the large tumor located at the dome among others - CV, bleeding, infection, etc. It might take a few weeks, months to get the bladder clear. Alternative is cystectomy. Good news is no obvious metastatic disease in LN or bone. Discussed staging, grading of bladder tumors and their recurrent nature. It's not uncommon for one smaller bladder tumor to need a staged procedure to get the stage/bladder depth accurate which is important to determine long term management (endoscopic, chemo/xrt after max turbt or cystectomy). She will need cardiac clearance and with risk of bleeding I would be concerned about a Hgb in the 7's to start.  I'll follow along. She doesn't need to be inpatient for my procedure(s), so I'll plan on getting her first TURBT scheduled in the next few weeks pending cardiac clearance. I'll probably start low and work up to the dome.

## 2013-11-26 NOTE — Progress Notes (Signed)
Lexiscan stress test completed without complication. LBBB make interpreting EKG difficult. Pending result by [facility] radiology  Signed, [name] PA Pager: [phone]

## 2013-11-27 DIAGNOSIS — Z01810 Encounter for preprocedural cardiovascular examination: Secondary | ICD-10-CM

## 2013-11-27 DIAGNOSIS — I428 Other cardiomyopathies: Secondary | ICD-10-CM

## 2013-11-27 LAB — TSH: TSH: 1.7 u[IU]/mL (ref 0.350–4.500)

## 2013-11-27 LAB — FECAL OCCULT BLOOD, GUAIAC: Fecal Occult Blood: NEGATIVE

## 2013-11-27 LAB — OCCULT BLOOD X 1 CARD TO LAB, STOOL: Fecal Occult Bld: NEGATIVE

## 2013-11-27 LAB — T4, FREE: FREE T4: 1.48 ng/dL (ref 0.80–1.80)

## 2013-11-27 MED ORDER — ACETAMINOPHEN 325 MG PO TABS
650.0000 mg | ORAL_TABLET | Freq: Three times a day (TID) | ORAL | Status: DC
  Administered 2013-11-27 – 2013-11-29 (×4): 650 mg via ORAL
  Filled 2013-11-27 (×5): qty 2

## 2013-11-27 MED ORDER — SODIUM CHLORIDE 0.9 % IV SOLN
510.0000 mg | Freq: Once | INTRAVENOUS | Status: AC
  Administered 2013-11-27: 510 mg via INTRAVENOUS
  Filled 2013-11-27: qty 17

## 2013-11-27 NOTE — Progress Notes (Signed)
Dr. Jenne Campus note from yesterday (11/26/13, 3:16 pm) reviewed, stating that patient has been cleared by cardiology for endoscopy.  However, I don't see cardiology note to that effect, perhaps the communication was a verbal one or perhaps it is somewhere in the medical record that I have not found.  Prior to proceeding with endoscopy, will need note from cardiology clearing patient from cardiac standpoint prior to patient undergoing endoscopy.  Will tentatively plan for endoscopy tomorrow, 11/28/13.

## 2013-11-27 NOTE — Progress Notes (Signed)
Pt had concerns about her left great toe on her foot is red and swollen underneath the nail bed and is slightly crusted under the nail bed. Pt states that it has been sore for about one week now. Dr. Conley Canal notified through [facility]. Pt afebrile at this time. Will continue to monitor patient.

## 2013-11-27 NOTE — Progress Notes (Signed)
Patient: [name] / Admit Date: 11/22/2013 / Date of Encounter: 11/27/2013, 8:44 AM   Subjective: No CP or SOB. Feels her heart beating irregularly.   Objective: Telemetry: NSR/Sinus tach, short runs of atrial tachycardia, frequent PACs Physical Exam: Blood pressure 107/65, pulse 71, temperature 98.3 F (36.8 C), temperature source Oral, resp. rate 16, height 5\' 8"  (1.727 m), weight 124 lb 6.4 oz (56.427 kg), SpO2 100.00%. General: Well developed, well nourished thin WF in no acute distress. Head: Normocephalic, atraumatic, sclera non-icteric, no xanthomas, nares are without discharge. Neck: JVP not elevated. Lungs: Clear bilaterally to auscultation without wheezes, rales, or rhonchi. Breathing is unlabored. Heart: Irregular, normal rate, S1 S2, soft SEM particularly at apex, no rubs, or gallops.  Abdomen: Soft, non-tender, non-distended with normoactive bowel sounds. No rebound/guarding. Extremities: No clubbing or cyanosis. No edema. Distal pedal pulses are 2+ and equal bilaterally. Neuro: Alert and oriented X 3. Moves all extremities spontaneously. Psych:  Responds to questions appropriately with a normal affect.  No intake or output data in the 24 hours ending 11/27/13 0844  Inpatient Medications:  . lisinopril  2.5 mg Oral Daily  . metoCLOPramide  5 mg Oral TID AC  . nicotine  14 mg Transdermal Daily  . pantoprazole  40 mg Oral BID  . sodium chloride  3 mL Intravenous Q12H  . sucralfate  1 g Oral 4 times per day   Infusions:  . sodium chloride Stopped (11/22/13 1802)    Labs:  Recent Labs  11/26/13 0459  NA 135*  K 4.3  CL 97  CO2 26  GLUCOSE 87  BUN 12  CREATININE 0.61  CALCIUM 8.7   No results found for this basename: AST, ALT, ALKPHOS, BILITOT, PROT, ALBUMIN,  in the last 72 hours  Recent Labs  11/25/13 0210 11/26/13 0459  WBC  --  6.9  HGB 7.5* 7.6*  HCT 24.5* 25.4*  MCV  --  67.9*  PLT  --  288   Radiology/Studies:  Ct Abdomen Pelvis W Wo  Contrast  11/24/2013   CLINICAL DATA:  Gross hematuria with history of bladder cancer 20 years ago.  EXAM: CT ABDOMEN AND PELVIS WITHOUT AND WITH CONTRAST  TECHNIQUE: Multidetector CT imaging of the abdomen and pelvis was performed following the standard protocol before and following the bolus administration of intravenous contrast.  CONTRAST:  115mL OMNIPAQUE IOHEXOL 300 MG/ML  SOLN  COMPARISON:  None.  FINDINGS: Lung bases: Centrilobular emphysema. Subpleural right middle lobe nodular density of 4 mm on image 1. Mild cardiomegaly. Dilated, fluid-filled lower esophagus on image 2 of series 301.  Kidneys / Ureters / Bladder: No renal calculi or hydronephrosis. No hydroureter or ureteric calculi. No bladder calculi. No renal mass on post-contrast images. Moderate renal collecting system opacification on delayed images. The ureters are moderately well opacified on delayed images. Portions of the mid right and distal left ureter are incompletely opacified. No filling defects within the opacified portions.  Multiple enhancing bladder masses are identified. 6.0 x 5.3 cm anteriorly and superiorly on image 58 of series 301. A small right posterior bladder nodule at 8 mm on image 59.  Right paracentral dependent bladder base 2.2 x 2.7 cm lesion on image 70 of series 301. More anterior inferior 9 mm nodule on image 72.  No gross transmural extension identified.  Other: Normal liver, spleen. Gastric antral wall thickening on image 74 of series 301. This is moderate. Adjacent porta hepatis nodes, including a 9 mm node on image  30.  Upper normal pancreatic duct size. No cause identified. Normal gallbladder, biliary tract, adrenal glands.  Aortic and branch vessel atherosclerosis. No retroperitoneal or retrocrural adenopathy. Colonic stool burden suggests constipation. Normal small bowel.  No pelvic adenopathy. Normal uterus without adnexal mass. Small volume pelvic cul-de-sac fluid, of indeterminate etiology.  Bones /  Musculoskeletal: Moderate osteopenia. Convex right lumbar spine curvature. Advanced lumbar spondylosis  IMPRESSION: 1. 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 No evidence of metastatic disease. No gross transmural extension. 2. Moderate gastric antral wall thickening. This could represent gastritis or gastric carcinoma. Endoscopy should be considered. Adjacent porta hepatis nodes are indeterminate. If the patient is diagnosed with gastric carcinoma, nodal metastasis would be a concern. 3. Esophageal air fluid level suggests dysmotility or gastroesophageal reflux. 4.  Possible constipation. 5. Small volume cul-de-sac fluid. 6. Centrilobular emphysema with nonspecific right lung base nodule.   Electronically Signed   By: Abigail Miyamoto M.D.   On: 11/24/2013 11:55   Dg Chest 2 View  11/22/2013   CLINICAL DATA:  Nausea and vomiting.  Sternal and epigastric pain.  EXAM: CHEST - 2 VIEW  COMPARISON:  08/31/2010  FINDINGS: The heart size and mediastinal contours are within normal limits. Stable COPD with mild bilateral pulmonary hyperinflation. There is no evidence of pulmonary edema, consolidation, pneumothorax, nodule or pleural fluid. The visualized skeletal structures are unremarkable.  IMPRESSION: Stable COPD.  No active disease.   Electronically Signed   By: Aletta Edouard M.D.   On: 11/22/2013 19:03   Dg Lumbar Spine 2-3 Views  11/23/2013   CLINICAL DATA:  Lumbar spine pain for 1 year radiating to the buttocks. History of back surgery due to herniated disc  EXAM: LUMBAR SPINE - 2-3 VIEW  COMPARISON:  None.  FINDINGS: There are 5 non rib-bearing lumbar type vertebral bodies.  There is a mild to moderate scoliotic curvature of the thoracolumbar spine with dominant cranial component convex to the right. No definite anterolisthesis or retrolisthesis.  Lumbar vertebral body heights are preserved given obliquity.  There is moderate to severe multilevel lumbar  spine DDD, likely worse at L1-L2, L2-L3 and L4-L5 with disc space height loss, endplate irregularity and sclerosis.  Atherosclerotic plaque within the abdominal aorta.  Bowel-gas pattern regional soft tissues appear normal. No radiopaque foreign body.  IMPRESSION: 1. No definite acute findings. 2. Mild to moderate scoliotic curvature of the thoracolumbar spine with associated moderate to severe multilevel lumbar spine DDD.   Electronically Signed   By: Sandi Mariscal M.D.   On: 11/23/2013 15:02   Nm Myocar Multi W/spect W/wall Motion / Ef  11/26/2013   CLINICAL DATA:  Chest pain.  Left bundle branch block.  EXAM: MYOCARDIAL IMAGING WITH SPECT (REST AND PHARMACOLOGIC-STRESS - 2 DAY PROTOCOL)  GATED LEFT VENTRICULAR WALL MOTION STUDY  LEFT VENTRICULAR EJECTION FRACTION  TECHNIQUE: Standard myocardial SPECT imaging was performed after resting intravenous injection of 10 mCi Tc-13m sestamibi. Subsequently, on a second day, intravenous infusion of Lexiscan was performed under the supervision of the Cardiology staff. At peak effect of the drug, 30 mCi Tc-46m sestamibi was injected intravenously and standard myocardial SPECT imaging was performed. Quantitative gated imaging was also performed to evaluate left ventricular wall motion, and estimate left ventricular ejection fraction.  COMPARISON:  None.  FINDINGS: Perfusion: Large high severity inferior wall perfusion defect on stress and rest images. Large Mild inferior severity anteroseptal wall hypoperfusion on stress and rest images. No inducible ischemia.  Wall  Motion: Global moderate hypokinesis.  Left Ventricular Ejection Fraction: 54% %  End diastolic volume 627 ml  End systolic volume 035 ml  IMPRESSION: 1. Large high severity inferior wall perfusion defect on stress and rest images. Large Mild severity anteroseptal wall perfusion defect on stress and rest images. No inducible ischemia.  2. Global moderate hypokinesis.  3. Left ventricular ejection fraction 28%%  4.  High-risk stress test findings*.  *2012 Appropriate Use Criteria for Coronary Revascularization Focused Update: J Am Coll Cardiol. 0093;81(8):299-371. http://content.[email or web address].aspx?articleid=1201161   Electronically Signed   By: Sherryl Barters M.D.   On: 11/26/2013 11:43     Assessment and Plan  1. Chest/epigastric pain: Troponins negative. CT suggested GERD vs dysmotility with antral wall thickening - ? Gastritis vs gastric CA. Dr. Meda Coffee disagrees with nuclear stress test result that was reported and she personally reviwed the study; feels that it demonstrates moderate global-appearing hypokinesis without any perfusion defect or ischemia. GI planning EGD - she is cleared for this from cardiac standpoint. I will let GI know.  2. 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 No prior h/o CAD or CHF. Euvolemic on exam. See above re: disagreement with nuc report. Stress test report showed no significant perfusion defect - no ischemia, with global LV dysfunction, LVEF 28%, consistent with non-ischemic cardiomyopathy. Considering degree of LV dysfunction the patient is considered a moderate risk for an intermediate risk surgery (bladder surgery). We are reluctant to add beta-blockers at this point in setting of alternating bundle and bradycardia with possible need for pacing as previously outlined by Dr. Caryl Comes. Started on lisinopril 2.5mg  daily - BP a little soft so will not titrate further today.   3. Hematuria/bladder CA: CT with multiple enhancing masses of bladder. Urology seeing and feels most probably recurrent bladder CA.  4. Palpitations with very frequent PACs, short runs of atrial tachycardia, and alternating BBB: Per Dr. Caryl Comes, treatment of her arrhythmia is problematic in the setting of her progressive conduction disease with alternating BBB. This would require back-up pacing (+/- ?ICD if EF </= 35% after 3 mos of therapy). Pt wishes to understand urologic  and GI plans prior to committing to invasive cardiac procedure. Dr. Olin Pia notes earlier this admission allude to using amiodarone if echo was not normal. Will clarify with MD if there have been any further discussions about this. I will also send for baseline thyroid function.  5. Microcytic Anemia: In setting of hematuria. IM following and transfusing as necessary.   6. Tob Abuse: Cessation advised - receiving nicotine patch.  7. Small pericardial effusion on echo 11/24/13: no evidence of hemodynamic compromise at present.  Signed, Melina Copa PA-C  The patient was seen, examined and discussed with Melina Copa, PA-C and I agree with the above.   The patient is doing well and denies any chest pain or SOB, she was sleeping comfortably in horizontal position and denied orthopnea or PND.  This patient underwent a Lexiscan nuclear stress test yesterday that showed no perfusion defect, no prior infarct or ischemia and is consistent with non-ischemic dilated cardiomyopathy. She has currently no symptoms of heart failure, there is no fluid overload, she is rather dry. No angina.  Considering her left ventricular dysfunction she is considered an intermediate risk for a low risk procedure such as endoscopy. There is currently no contraindication from cardiology standpoint for this patient to undergo an endoscopy.   Because of soft BP we are limited with starting ACEI and betablocker right now,  but we expect that once her anemia and dehydration is improved we would be able to start CHF therapy. We would recommend a careful iv hydration.   We will follow.   [name] 11/27/2013

## 2013-11-27 NOTE — Progress Notes (Signed)
D/w Dr. Paulita Fujita and Dr. Meda Coffee   PROGRESS NOTE  [name]:[id number] DOB: 08-21-1940 DOA: 11/22/2013 PCP: Abigail Miyamoto, MD  [name] is a 73 y.o. female with a remote history of bladder cancer (1995) who presents to the ED with complaints of nausea and vomiting x 2 days with substernal chest pain and epigastric pain. She denies any diarrhea, or hematemesis. She reports that she has had palpitations a feeling as if her heart was pounding. She reports that she had nausea and vomiting for 2 days last week and thought it was due to food poisoning  Assessment/Plan: Hyponatremia, hypovolemic Resolving. Now euvolemic. Saline locked.   Atrial ectopy with alternating BBB Echo shows EF 20%. Per Dr. Meda Coffee, who overread Dr. Maximino Sarin reading of myoview no significant perfusion defect, no ischemia with global LV dysfunction, consistent with nonischemic cardiomyopathy.  Hematuria Imaging shows multiple masses, likely recurrence of cancer. TURBT within the next 2 weeks after discharge  Nausea and Vomiting/epigastric pain/abnormalcT:  CT shows fluid filled esophagus and gastric antral wall thickening. Never had EGD. On empiric carafate, PPI, reglan. Symptoms improving. Cleared by cardiology to proceed with EGD. Dr. Paulita Fujita unable to do with anesthesia until tomorrow  Iron deficiency anemia: likely from hematuria. stools heme negative. hgb decreased with hydration and hematuria. If less than 7, or symptomatic, will transfuse. For EGD tomorrow. Will give IV iron  Cardiomyopathy. No evidence for acute CHF. Started on ACE inhibitor  Chronic LBP. Schedule tylenol per patient request  Code Status: full Family Communication: daughter  Disposition Plan:    Consultants:  Cardiology  Urology  [facility]  Procedures:    HPI/Subjective: Pain and vomiting improving. Able to eat better. No dyspnea. C/o chronic back pain  Objective: Filed Vitals:   11/27/13 1040  BP: 118/86  Pulse:     Temp:   Resp:     Intake/Output Summary (Last 24 hours) at 11/27/13 1350 Last data filed at 11/27/13 1050  Gross per 24 hour  Intake      3 ml  Output      0 ml  Net      3 ml   Filed Weights   11/22/13 2319 11/23/13 0400 11/26/13 0501  Weight: 55.248 kg (121 lb 12.8 oz) 55.23 kg (121 lb 12.2 oz) 56.427 kg (124 lb 6.4 oz)    Exam:   General:  A+Ox3, NAD  Cardiovascular: rrr without MGR  Respiratory: clear without WRR  Abdomen: +BS, soft, nontender  Ext: no CCE  Data Reviewed: Basic Metabolic Panel:  Recent Labs Lab 11/22/13 1653 11/23/13 0345 11/24/13 0305 11/26/13 0459  NA 123* 127* 132* 135*  K 3.7 3.8 3.8 4.3  CL 83* 90* 98 97  CO2 25 25 25 26   GLUCOSE 111* 105* 116* 87  BUN 11 12 14 12   CREATININE 0.66 0.76 0.60 0.61  CALCIUM 10.0 9.1 8.1* 8.7   Liver Function Tests:  Recent Labs Lab 11/22/13 1653  AST 26  ALT 14  ALKPHOS 63  BILITOT 0.4  PROT 6.9  ALBUMIN 3.7    Recent Labs Lab 11/23/13 0356  LIPASE 30   No results found for this basename: AMMONIA,  in the last 168 hours CBC:  Recent Labs Lab 11/22/13 1653 11/23/13 0345 11/24/13 0305 11/25/13 0210 11/26/13 0459  WBC 7.2 8.2 6.6  --  6.9  HGB 9.2* 9.0* 7.6* 7.5* 7.6*  HCT 28.7* 29.2* 24.5* 24.5* 25.4*  MCV 63.6* 66.1* 67.5*  --  67.9*  PLT 322 314 274  --  288   Cardiac Enzymes:  Recent Labs Lab 11/22/13 2338 11/23/13 0356 11/23/13 0955  TROPONINI <0.30 <0.30 <0.30   BNP (last 3 results) No results found for this basename: PROBNP,  in the last 8760 hours CBG: No results found for this basename: GLUCAP,  in the last 168 hours  Recent Results (from the past 240 hour(s))  URINE CULTURE     Status: None   Collection Time    11/23/13  2:26 AM      Result Value Ref Range Status   Specimen Description URINE, RANDOM   Final   Special Requests NONE   Final   Culture  Setup Time     Final   Value: 11/23/2013 10:19     Performed at [place]      Final   Value: NO GROWTH     Performed at [facility]   Culture     Final   Value: NO GROWTH     Performed at [facility]   Report Status 11/24/2013 FINAL   Final     Studies: Nm Myocar Multi W/spect W/wall Motion / Ef  11/26/2013   CLINICAL DATA:  Chest pain.  Left bundle branch block.  EXAM: MYOCARDIAL IMAGING WITH SPECT (REST AND PHARMACOLOGIC-STRESS - 2 DAY PROTOCOL)  GATED LEFT VENTRICULAR WALL MOTION STUDY  LEFT VENTRICULAR EJECTION FRACTION  TECHNIQUE: Standard myocardial SPECT imaging was performed after resting intravenous injection of 10 mCi Tc-92m sestamibi. Subsequently, on a second day, intravenous infusion of Lexiscan was performed under the supervision of the Cardiology staff. At peak effect of the drug, 30 mCi Tc-17m sestamibi was injected intravenously and standard myocardial SPECT imaging was performed. Quantitative gated imaging was also performed to evaluate left ventricular wall motion, and estimate left ventricular ejection fraction.  COMPARISON:  None.  FINDINGS: Perfusion: Large high severity inferior wall perfusion defect on stress and rest images. Large Mild inferior severity anteroseptal wall hypoperfusion on stress and rest images. No inducible ischemia.  Wall Motion: Global moderate hypokinesis.  Left Ventricular Ejection Fraction: 24% %  End diastolic volume 097 ml  End systolic volume 353 ml  IMPRESSION: 1. Large high severity inferior wall perfusion defect on stress and rest images. Large Mild severity anteroseptal wall perfusion defect on stress and rest images. No inducible ischemia.  2. Global moderate hypokinesis.  3. Left ventricular ejection fraction 28%%  4. High-risk stress test findings*.  *2012 Appropriate Use Criteria for Coronary Revascularization Focused Update: J Am Coll Cardiol. 2992;42(6):834-196. http://content.[email or web address]?articleid=1201161   Electronically Signed   By: Sherryl Barters M.D.   On: 11/26/2013 11:43    Echo Left ventricle: The cavity size was mildly dilated. Wall thickness was normal. Systolic function was severely reduced. The estimated ejection fraction was in the range of 20% to 25%. Diffuse hypokinesis. Doppler parameters are consistent with abnormal left ventricular relaxation (grade 1 diastolic dysfunction). - Mitral valve: There was mild regurgitation. - Left atrium: The atrium was severely dilated. - Pulmonary arteries: Systolic pressure was mildly increased. PA peak pressure: 41 mm Hg (S). - Pericardium, extracardiac: A small pericardial effusion was identified.  Impressions:  - Severe global reduction in LV function; grade 1 diastolic dysfunction; mild MR and TR; mildly elevated pulmonary pressures; small pericardial effusion.   Scheduled Meds: . lisinopril  2.5 mg Oral Daily  . metoCLOPramide  5 mg Oral TID AC  . nicotine  14 mg Transdermal Daily  .  pantoprazole  40 mg Oral BID  . sodium chloride  3 mL Intravenous Q12H  . sucralfate  1 g Oral 4 times per day   Continuous Infusions: . sodium chloride Stopped (11/22/13 1802)   Antibiotics Given (last 72 hours)   Date/Time Action Medication Dose Rate   11/24/13 1418 Given   cefTRIAXone (ROCEPHIN) 1 g in dextrose 5 % 50 mL IVPB 1 g 100 mL/hr     Time spent: 55 min  [facility] Pager [phone]. If 7PM-7AM, please contact night-coverage at [email or web address], password [facility] 11/27/2013, 1:50 PM  LOS: 5 days

## 2013-11-28 ENCOUNTER — Inpatient Hospital Stay (HOSPITAL_COMMUNITY): Payer: Medicare Other | Admitting: Anesthesiology

## 2013-11-28 ENCOUNTER — Encounter (HOSPITAL_COMMUNITY): Payer: Medicare Other | Admitting: Anesthesiology

## 2013-11-28 ENCOUNTER — Encounter (HOSPITAL_COMMUNITY)
Admission: EM | Disposition: A | Discharge status: Home / Self Care | Payer: Medicare Other | Source: Home / Self Care | Attending: Internal Medicine

## 2013-11-28 ENCOUNTER — Encounter (HOSPITAL_COMMUNITY): Payer: Self-pay | Admitting: Gastroenterology

## 2013-11-28 DIAGNOSIS — Z8719 Personal history of other diseases of the digestive system: Secondary | ICD-10-CM | POA: Diagnosis present

## 2013-11-28 DIAGNOSIS — Z8711 Personal history of peptic ulcer disease: Secondary | ICD-10-CM | POA: Diagnosis present

## 2013-11-28 HISTORY — PX: ESOPHAGOGASTRODUODENOSCOPY: SHX5428

## 2013-11-28 SURGERY — EGD (ESOPHAGOGASTRODUODENOSCOPY)
Anesthesia: Monitor Anesthesia Care

## 2013-11-28 MED ORDER — SODIUM CHLORIDE 0.9 % IV SOLN
INTRAVENOUS | Status: DC
  Administered 2013-11-28: 500 mL via INTRAVENOUS

## 2013-11-28 MED ORDER — MIDAZOLAM HCL 2 MG/2ML IJ SOLN
INTRAMUSCULAR | Status: AC
Start: 2013-11-28 — End: 2013-11-28
  Filled 2013-11-28: qty 2

## 2013-11-28 MED ORDER — PROPOFOL 10 MG/ML IV BOLUS
INTRAVENOUS | Status: AC
  Filled 2013-11-28: qty 20

## 2013-11-28 MED ORDER — FENTANYL CITRATE 0.05 MG/ML IJ SOLN
INTRAMUSCULAR | Status: AC
  Filled 2013-11-28: qty 5

## 2013-11-28 MED ORDER — ACETAMINOPHEN 325 MG PO TABS
650.0000 mg | ORAL_TABLET | Freq: Once | ORAL | Status: AC
  Administered 2013-11-28: 650 mg via ORAL
  Filled 2013-11-28: qty 2

## 2013-11-28 MED ORDER — MIDAZOLAM HCL 2 MG/2ML IJ SOLN
INTRAMUSCULAR | Status: AC
  Filled 2013-11-28: qty 2

## 2013-11-28 NOTE — Progress Notes (Signed)
D/w Dr. Paulita Fujita and Dr. Meda Coffee   PROGRESS NOTE  [name] QMG:[id number] DOB: 02/26/1941 DOA: 11/22/2013 PCP: Abigail Miyamoto, MD  [name] is a 73 y.o. female with a remote history of bladder cancer (1995) who presents to the ED with complaints of nausea and vomiting x 2 days with substernal chest pain and epigastric pain. She denies any diarrhea, or hematemesis. She reports that she has had palpitations a feeling as if her heart was pounding. She reports that she had nausea and vomiting for 2 days last week and thought it was due to food poisoning  Assessment/Plan: Hyponatremia, hypovolemic Resolving. Now euvolemic. Saline locked.   Atrial ectopy with alternating BBB  Newly diagnosed nonischemic cardiomyopathy.  Echo shows EF 20%. No CHF. Started on ACE inhibitor. No beta blocker due to alternating BBB.  Per Dr. Meda Coffee, who overread Dr. Maximino Sarin reading of myoview no significant perfusion defect, no ischemia with global LV dysfunction, consistent with nonischemic cardiomyopathy.   Hematuria Imaging shows multiple masses, likely recurrence of cancer. TURBT within the next 2 weeks after discharge  Nausea and Vomiting/epigastric pain/abnormalcT:  CT shows fluid filled esophagus and gastric antral wall thickening. Improved on ppi, carafate, reglan.  EGD shows gastric ulcer with heaped up margins. Biopsy pending.  Iron deficiency anemia: likely from hematuria. stools heme negative. Has not required transfusion. Received IV iron. Recheck in am  Cardiomyopathy. No evidence for acute CHF. Started on ACE inhibitor  Chronic LBP. Schedule tylenol per patient request. Xray shows DDD. Consider MRI as outpatient after multiple medical issues addressed.  Code Status: full Family Communication: daughter Disposition Plan: home 1-2 days if stable   Consultants:  Cardiology  Urology  [facility]  Procedures:  EGD with biopsy  HPI/Subjective: Pain and vomiting improving. Able to eat  better. No dyspnea. C/o chronic back pain  Objective: Filed Vitals:   11/28/13 1115  BP: 136/73  Pulse:   Temp:   Resp:     Intake/Output Summary (Last 24 hours) at 11/28/13 1655 Last data filed at 11/28/13 1047  Gross per 24 hour  Intake    490 ml  Output      0 ml  Net    490 ml   Filed Weights   11/22/13 2319 11/23/13 0400 11/26/13 0501  Weight: 55.248 kg (121 lb 12.8 oz) 55.23 kg (121 lb 12.2 oz) 56.427 kg (124 lb 6.4 oz)    Exam:   General:  A+Ox3, NAD  Cardiovascular: rrr without MGR  Respiratory: clear without WRR  Abdomen: +BS, soft, nontender  Ext: no CCE  Data Reviewed: Basic Metabolic Panel:  Recent Labs Lab 11/22/13 1653 11/23/13 0345 11/24/13 0305 11/26/13 0459  NA 123* 127* 132* 135*  K 3.7 3.8 3.8 4.3  CL 83* 90* 98 97  CO2 25 25 25 26   GLUCOSE 111* 105* 116* 87  BUN 11 12 14 12   CREATININE 0.66 0.76 0.60 0.61  CALCIUM 10.0 9.1 8.1* 8.7   Liver Function Tests:  Recent Labs Lab 11/22/13 1653  AST 26  ALT 14  ALKPHOS 63  BILITOT 0.4  PROT 6.9  ALBUMIN 3.7    Recent Labs Lab 11/23/13 0356  LIPASE 30   No results found for this basename: AMMONIA,  in the last 168 hours CBC:  Recent Labs Lab 11/22/13 1653 11/23/13 0345 11/24/13 0305 11/25/13 0210 11/26/13 0459  WBC 7.2 8.2 6.6  --  6.9  HGB 9.2* 9.0* 7.6* 7.5* 7.6*  HCT 28.7* 29.2* 24.5*  24.5* 25.4*  MCV 63.6* 66.1* 67.5*  --  67.9*  PLT 322 314 274  --  288   Cardiac Enzymes:  Recent Labs Lab 11/22/13 2338 11/23/13 0356 11/23/13 0955  TROPONINI <0.30 <0.30 <0.30   BNP (last 3 results) No results found for this basename: PROBNP,  in the last 8760 hours CBG: No results found for this basename: GLUCAP,  in the last 168 hours  Recent Results (from the past 240 hour(s))  URINE CULTURE     Status: None   Collection Time    11/23/13  2:26 AM      Result Value Ref Range Status   Specimen Description URINE, RANDOM   Final   Special Requests NONE   Final    Culture  Setup Time     Final   Value: 11/23/2013 10:19     Performed at [place]     Final   Value: NO GROWTH     Performed at [facility]   Culture     Final   Value: NO GROWTH     Performed at [facility]   Report Status 11/24/2013 FINAL   Final     Studies: No results found. Echo Left ventricle: The cavity size was mildly dilated. Wall thickness was normal. Systolic function was severely reduced. The estimated ejection fraction was in the range of 20% to 25%. Diffuse hypokinesis. Doppler parameters are consistent with abnormal left ventricular relaxation (grade 1 diastolic dysfunction). - Mitral valve: There was mild regurgitation. - Left atrium: The atrium was severely dilated. - Pulmonary arteries: Systolic pressure was mildly increased. PA peak pressure: 41 mm Hg (S). - Pericardium, extracardiac: A small pericardial effusion was identified.  Impressions:  - Severe global reduction in LV function; grade 1 diastolic dysfunction; mild MR and TR; mildly elevated pulmonary pressures; small pericardial effusion.   Scheduled Meds: . acetaminophen  650 mg Oral TID  . lisinopril  2.5 mg Oral Daily  . metoCLOPramide  5 mg Oral TID AC  . nicotine  14 mg Transdermal Daily  . pantoprazole  40 mg Oral BID  . sodium chloride  3 mL Intravenous Q12H  . sucralfate  1 g Oral 4 times per day   Continuous Infusions: . sodium chloride Stopped (11/22/13 1802)   Antibiotics Given (last 72 hours)   None     Time spent: 35 min  [facility] Pager [phone]. If 7PM-7AM, please contact night-coverage at [email or web address], password [facility] 11/28/2013, 4:55 PM  LOS: 6 days

## 2013-11-28 NOTE — Interval H&P Note (Signed)
History and Physical Interval Note:  11/28/2013 10:13 AM  [name]  has presented today for surgery, with the diagnosis of abdominal pain  The various methods of treatment have been discussed with the patient and family. After consideration of risks, benefits and other options for treatment, the patient has consented to  Procedure(s): ESOPHAGOGASTRODUODENOSCOPY (EGD) (N/A) as a surgical intervention .  The patient's history has been reviewed, patient examined, no change in status, stable for surgery.  I have reviewed the patient's chart and labs.  Questions were answered to the patient's satisfaction.     Reshaun Briseno M  Assessment:  1.  Metastatic bladder cancer. 2.  Chest and epigastric pain. 3.  Abnormal CT scan, gastric wall thickening.  Plan:  1.  Endoscopy for evaluation of gastric findings. 2.  Risks (bleeding, infection, bowel perforation that could require surgery, sedation-related changes in cardiopulmonary systems), benefits (identification and possible treatment of source of symptoms, exclusion of certain causes of symptoms), and alternatives (watchful waiting, radiographic imaging studies, empiric medical treatment) of upper endoscopy (EGD) were explained to patient/family in detail and patient wishes to proceed.

## 2013-11-28 NOTE — Progress Notes (Signed)
Patient has not had anything to eat or drink since midnight, due to EGD, that is scheduled for today.

## 2013-11-28 NOTE — Anesthesia Postprocedure Evaluation (Signed)
  Anesthesia Post-op Note  Patient: [name]  Procedure(s) Performed: Procedure(s): ESOPHAGOGASTRODUODENOSCOPY (EGD) (N/A)  Patient Location: Endoscopy Unit  Anesthesia Type:MAC  Level of Consciousness: awake  Airway and Oxygen Therapy: Patient Spontanous Breathing and Patient connected to nasal cannula oxygen  Post-op Pain: none  Post-op Assessment: Post-op Vital signs reviewed, Patient's Cardiovascular Status Stable, Respiratory Function Stable, Patent Airway, No signs of Nausea or vomiting and Pain level controlled  Post-op Vital Signs: Reviewed and stable  Last Vitals:  Filed Vitals:   11/28/13 1056  BP: 140/60  Pulse: 66  Temp:   Resp:     Complications: No apparent anesthesia complications

## 2013-11-28 NOTE — Progress Notes (Signed)
Patient Name: [name] Date of Encounter: 11/28/2013     Principal Problem:   Hyponatremia Active Problems:   Palpitations   Anemia, iron deficiency   Chest pain   Epigastric pain   Bladder cancer   Hematuria   LBBB (left bundle branch block)   Atrial ectopy   Congestive dilated cardiomyopathy   Preoperative cardiovascular examination    SUBJECTIVE  No CP or SOB at rest. Mild DOE. Mild tenderness in abdomen with palpation  CURRENT MEDS . acetaminophen  650 mg Oral TID  . lisinopril  2.5 mg Oral Daily  . metoCLOPramide  5 mg Oral TID AC  . nicotine  14 mg Transdermal Daily  . pantoprazole  40 mg Oral BID  . sodium chloride  3 mL Intravenous Q12H  . sucralfate  1 g Oral 4 times per day    OBJECTIVE  Filed Vitals:   11/27/13 1040 11/27/13 1450 11/27/13 2100 11/28/13 0500  BP: 118/86 117/57 125/77 120/73  Pulse:  71 77 69  Temp:   98.3 F (36.8 C) 99.8 F (37.7 C)  TempSrc:      Resp:   16 18  Height:      Weight:      SpO2:  100% 97% 99%    Intake/Output Summary (Last 24 hours) at 11/28/13 0742 Last data filed at 11/27/13 2100  Gross per 24 hour  Intake    243 ml  Output      0 ml  Net    243 ml   Filed Weights   11/22/13 2319 11/23/13 0400 11/26/13 0501  Weight: 121 lb 12.8 oz (55.248 kg) 121 lb 12.2 oz (55.23 kg) 124 lb 6.4 oz (56.427 kg)    PHYSICAL EXAM  General: Pleasant, NAD. Neuro: Alert and oriented X 3. Moves all extremities spontaneously. Psych: Normal affect. HEENT:  Normal  Neck: Supple without bruits or JVD. Lungs:  Resp regular and unlabored, CTA. Heart: RRR no s3, s4, or murmurs. Abdomen: Soft, non-tender, non-distended, BS + x 4.  Extremities: No clubbing, cyanosis or edema. DP/PT/Radials 2+ and equal bilaterally.  Accessory Clinical Findings  CBC  Recent Labs  11/26/13 0459  WBC 6.9  HGB 7.6*  HCT 25.4*  MCV 67.9*  PLT 026   Basic Metabolic Panel  Recent Labs  11/26/13 0459  NA 135*  K 4.3  CL 97  CO2  26  GLUCOSE 87  BUN 12  CREATININE 0.61  CALCIUM 8.7   Thyroid Function Tests  Recent Labs  11/27/13 0930  TSH 1.700    TELE NSR with HR 60s, frequent PACs, PVCs, and alternating BBB. No significant ventricular ectopy. Short bursts of sinus tach <10 sec in duration    ECG  No new EKG  Echocardiogram  11/24/2013 LV EF: 20% - 25%  ------------------------------------------------------------------- Indications: Dyspnea 786.09.  ------------------------------------------------------------------- History: PMH: Palpitations. Chest pain.  ------------------------------------------------------------------- Study Conclusions  - Left ventricle: The cavity size was mildly dilated. Wall thickness was normal. Systolic function was severely reduced. The estimated ejection fraction was in the range of 20% to 25%. Diffuse hypokinesis. Doppler parameters are consistent with abnormal left ventricular relaxation (grade 1 diastolic dysfunction). - Mitral valve: There was mild regurgitation. - Left atrium: The atrium was severely dilated. - Pulmonary arteries: Systolic pressure was mildly increased. PA peak pressure: 41 mm Hg (S). - Pericardium, extracardiac: A small pericardial effusion was identified.  Impressions:  - Severe global reduction in LV function; grade 1 diastolic dysfunction; mild MR and  TR; mildly elevated pulmonary pressures; small pericardial effusion.        ASSESSMENT AND PLAN  1. Chest pain/epigastric pain  - Dr. Meda Coffee personally reviewed with stress test film, disagree with result, it demonstrated moderate global-appearing hypokinesis without any perfusion defect or ischemia.  - Planning for EGD today for gastric pain  - per Dr. Meda Coffee, patient is intermediate risk for a low risk procedure such as endoscopy. She is cleared from cardiology perspective   2. Newly diagnosed cardiomyopatyh without clinical CHF: EF 20-25% by echo  - based on Myoview  personally reviewed by Dr. Meda Coffee, likely nonischemic cardiomyopathy  - added low dose ACEI for cardiomyopathy, will defer to urology whether it needs to be stopped prior to surgery. Will avoid BB for now given alternating bundle branch block although she does have very short bursts of atrial tach <10sec in duration  3. Hematuria/bladder CA  4. Palpitations with very frequent PACs, short runs of atrial tachycardia, and alternating BBB  - per Dr. Caryl Comes, alternating BBB, no BB for now, may need pacer at some point.    5. Microcytic Anemia 6. Tob Abuse 7. Small pericardial effusion on echo 11/24/13: no evidence of hemodynamic compromise    Signed, Woodward Ku Pager: [phone]  The patient was seen, examined and discussed with [name], PA-C and I agree with the above.   A 73 year old patient with microcytic anemia, epigastric pain, hematuria, h/o bladder cancer with new diagnosis of non-ischemic dilated cardiomyopathy. Again, the patient is well compensated and has no signs of angina or heart failure. There is no contraindication from cardiac standpoint to undergo endoscopy.  We would recommend to start lisinopril 2.5 mg po daily and uptitrate as tolerated by BP.    [name] 11/28/2013

## 2013-11-28 NOTE — Anesthesia Preprocedure Evaluation (Addendum)
Anesthesia Evaluation  Patient identified by MRN, date of birth, ID band Patient awake    Reviewed: Allergy & Precautions, H&P , NPO status , Patient's Chart, lab work & pertinent test results, reviewed documented beta blocker date and time   History of Anesthesia Complications Negative for: history of anesthetic complications  Airway Mallampati: I TM Distance: >3 FB Neck ROM: Full    Dental  (+) Edentulous Upper, Edentulous Lower   Pulmonary neg sleep apnea, neg COPDCurrent Smoker,  1-1.5 ppd cigs breath sounds clear to auscultation        Cardiovascular - angina+CHF - CAD + dysrhythmias Rhythm:Regular  Ef 20%   Neuro/Psych negative neurological ROS     GI/Hepatic Epigastric pain   Endo/Other    Renal/GU      Musculoskeletal   Abdominal   Peds  Hematology  (+) anemia ,   Anesthesia Other Findings   Reproductive/Obstetrics                         Anesthesia Physical Anesthesia Plan  ASA: III  Anesthesia Plan: MAC   Post-op Pain Management:    Induction: Intravenous  Airway Management Planned: Mask  Additional Equipment:   Intra-op Plan:   Post-operative Plan:   Informed Consent: I have reviewed the patients History and Physical, chart, labs and discussed the procedure including the risks, benefits and alternatives for the proposed anesthesia with the patient or authorized representative who has indicated his/her understanding and acceptance.   Dental advisory given  Plan Discussed with: CRNA, Anesthesiologist and Surgeon  Anesthesia Plan Comments:         Anesthesia Quick Evaluation

## 2013-11-28 NOTE — Anesthesia Postprocedure Evaluation (Signed)
  Anesthesia Post-op Note  Patient: [name]  Procedure(s) Performed: Procedure(s): ESOPHAGOGASTRODUODENOSCOPY (EGD) (N/A)  Patient Location: PACU and Endoscopy Unit  Anesthesia Type:MAC  Level of Consciousness: awake and patient cooperative  Airway and Oxygen Therapy: Patient Spontanous Breathing and Patient connected to nasal cannula oxygen  Post-op Pain: none  Post-op Assessment: Post-op Vital signs reviewed, Patient's Cardiovascular Status Stable, Respiratory Function Stable, Patent Airway and No signs of Nausea or vomiting  Post-op Vital Signs: Reviewed and stable  Last Vitals:  Filed Vitals:   11/28/13 0923  BP: 149/72  Pulse: 77  Temp: 36.6 C  Resp: 18    Complications: No apparent anesthesia complications

## 2013-11-28 NOTE — H&P (View-Only) (Signed)
Patient Name: [name] Date of Encounter: 11/28/2013     Principal Problem:   Hyponatremia Active Problems:   Palpitations   Anemia, iron deficiency   Chest pain   Epigastric pain   Bladder cancer   Hematuria   LBBB (left bundle branch block)   Atrial ectopy   Congestive dilated cardiomyopathy   Preoperative cardiovascular examination    SUBJECTIVE  No CP or SOB at rest. Mild DOE. Mild tenderness in abdomen with palpation  CURRENT MEDS . acetaminophen  650 mg Oral TID  . lisinopril  2.5 mg Oral Daily  . metoCLOPramide  5 mg Oral TID AC  . nicotine  14 mg Transdermal Daily  . pantoprazole  40 mg Oral BID  . sodium chloride  3 mL Intravenous Q12H  . sucralfate  1 g Oral 4 times per day    OBJECTIVE  Filed Vitals:   11/27/13 1040 11/27/13 1450 11/27/13 2100 11/28/13 0500  BP: 118/86 117/57 125/77 120/73  Pulse:  71 77 69  Temp:   98.3 F (36.8 C) 99.8 F (37.7 C)  TempSrc:      Resp:   16 18  Height:      Weight:      SpO2:  100% 97% 99%    Intake/Output Summary (Last 24 hours) at 11/28/13 0742 Last data filed at 11/27/13 2100  Gross per 24 hour  Intake    243 ml  Output      0 ml  Net    243 ml   Filed Weights   11/22/13 2319 11/23/13 0400 11/26/13 0501  Weight: 121 lb 12.8 oz (55.248 kg) 121 lb 12.2 oz (55.23 kg) 124 lb 6.4 oz (56.427 kg)    PHYSICAL EXAM  General: Pleasant, NAD. Neuro: Alert and oriented X 3. Moves all extremities spontaneously. Psych: Normal affect. HEENT:  Normal  Neck: Supple without bruits or JVD. Lungs:  Resp regular and unlabored, CTA. Heart: RRR no s3, s4, or murmurs. Abdomen: Soft, non-tender, non-distended, BS + x 4.  Extremities: No clubbing, cyanosis or edema. DP/PT/Radials 2+ and equal bilaterally.  Accessory Clinical Findings  CBC  Recent Labs  11/26/13 0459  WBC 6.9  HGB 7.6*  HCT 25.4*  MCV 67.9*  PLT 263   Basic Metabolic Panel  Recent Labs  11/26/13 0459  NA 135*  K 4.3  CL 97  CO2  26  GLUCOSE 87  BUN 12  CREATININE 0.61  CALCIUM 8.7   Thyroid Function Tests  Recent Labs  11/27/13 0930  TSH 1.700    TELE NSR with HR 60s, frequent PACs, PVCs, and alternating BBB. No significant ventricular ectopy. Short bursts of sinus tach <10 sec in duration    ECG  No new EKG  Echocardiogram  11/24/2013 LV EF: 20% - 25%  ------------------------------------------------------------------- Indications: Dyspnea 786.09.  ------------------------------------------------------------------- History: PMH: Palpitations. Chest pain.  ------------------------------------------------------------------- Study Conclusions  - Left ventricle: The cavity size was mildly dilated. Wall thickness was normal. Systolic function was severely reduced. The estimated ejection fraction was in the range of 20% to 25%. Diffuse hypokinesis. Doppler parameters are consistent with abnormal left ventricular relaxation (grade 1 diastolic dysfunction). - Mitral valve: There was mild regurgitation. - Left atrium: The atrium was severely dilated. - Pulmonary arteries: Systolic pressure was mildly increased. PA peak pressure: 41 mm Hg (S). - Pericardium, extracardiac: A small pericardial effusion was identified.  Impressions:  - Severe global reduction in LV function; grade 1 diastolic dysfunction; mild MR and  TR; mildly elevated pulmonary pressures; small pericardial effusion.        ASSESSMENT AND PLAN  1. Chest pain/epigastric pain  - Dr. Meda Coffee personally reviewed with stress test film, disagree with result, it demonstrated moderate global-appearing hypokinesis without any perfusion defect or ischemia.  - Planning for EGD today for gastric pain  - per Dr. Meda Coffee, patient is intermediate risk for a low risk procedure such as endoscopy. She is cleared from cardiology perspective   2. Newly diagnosed cardiomyopatyh without clinical CHF: EF 20-25% by echo  - based on Myoview  personally reviewed by Dr. Meda Coffee, likely nonischemic cardiomyopathy  - added low dose ACEI for cardiomyopathy, will defer to urology whether it needs to be stopped prior to surgery. Will avoid BB for now given alternating bundle branch block although she does have very short bursts of atrial tach <10sec in duration  3. Hematuria/bladder CA  4. Palpitations with very frequent PACs, short runs of atrial tachycardia, and alternating BBB  - per Dr. Caryl Comes, alternating BBB, no BB for now, may need pacer at some point.    5. Microcytic Anemia 6. Tob Abuse 7. Small pericardial effusion on echo 11/24/13: no evidence of hemodynamic compromise    Signed, Woodward Ku Pager: [phone]  The patient was seen, examined and discussed with [name], PA-C and I agree with the above.   A 73 year old patient with microcytic anemia, epigastric pain, hematuria, h/o bladder cancer with new diagnosis of non-ischemic dilated cardiomyopathy. Again, the patient is well compensated and has no signs of angina or heart failure. There is no contraindication from cardiac standpoint to undergo endoscopy.  We would recommend to start lisinopril 2.5 mg po daily and uptitrate as tolerated by BP.    [name] 11/28/2013

## 2013-11-28 NOTE — Op Note (Signed)
[facility] [place], [address]   ENDOSCOPY PROCEDURE REPORT  PATIENT: Bianca, Anderson  MR#: [id number] BIRTHDATE: [birth year] , 73  yrs. old GENDER: Female ENDOSCOPIST: Arta Silence, MD REFERRED BY:  [facility] PROCEDURE DATE:  11/28/2013 PROCEDURE:  EGD w/ biopsy ASA CLASS:     Class III INDICATIONS:  abdominal pain, history bladder cancer, gastric wall thickening on CT scan. MEDICATIONS: MAC sedation, administered by CRNA TOPICAL ANESTHETIC: none  DESCRIPTION OF PROCEDURE: After the risks benefits and alternatives of the procedure were thoroughly explained, informed consent was obtained.  The Pentax Gastroscope [id number] endoscope was introduced through the mouth and advanced to the second portion of the duodenum. Without limitations.  The instrument was slowly withdrawn as the mucosa was fully examined.    Findings:  Normal esophagus.  Large deep well-demarcated 3 x 3 cm ulcer with heaped-up margins in the pre-pyloric antrum along the lesser curvature; multiple biopsies along the ulcer rim were taken with cold biopsy forceps.  Remainder of stomach normal.  Duodenum normal to the second portion.              The scope was then withdrawn from the patient and the procedure completed.  ENDOSCOPIC IMPRESSION:     As above.  Large gastric ulcer, biopsied, which could account for patient's abdominal pain and the gastric wall thickening seen on CT scan.  RECOMMENDATIONS:     1.  Watch for potential complications of procedure. 2.  Await biopsy results (sent "rush"). 3.  PPI therapy. 4.  Advance diet as tolerated.  eSigned:  Arta Silence, MD 11/28/2013 10:44 AM   CC:

## 2013-11-28 NOTE — Transfer of Care (Signed)
Immediate Anesthesia Transfer of Care Note  Patient: [name]  Procedure(s) Performed: Procedure(s): ESOPHAGOGASTRODUODENOSCOPY (EGD) (N/A)  Patient Location: PACU in End  Anesthesia Type:MAC  Level of Consciousness: awake and patient cooperative  Airway & Oxygen Therapy: Patient Spontanous Breathing and Patient connected to nasal cannula oxygen  Post-op Assessment: Report given to PACU RN, Post -op Vital signs reviewed and stable and Patient moving all extremities  Post vital signs: Reviewed and stable  Complications: No apparent anesthesia complications

## 2013-11-29 ENCOUNTER — Encounter (HOSPITAL_COMMUNITY): Payer: Self-pay | Admitting: Gastroenterology

## 2013-11-29 LAB — CBC
HEMATOCRIT: 26.6 % — AB (ref 36.0–46.0)
HEMOGLOBIN: 7.8 g/dL — AB (ref 12.0–15.0)
MCH: 19.5 pg — AB (ref 26.0–34.0)
MCHC: 29.3 g/dL — ABNORMAL LOW (ref 30.0–36.0)
MCV: 66.5 fL — ABNORMAL LOW (ref 78.0–100.0)
Platelets: 356 10*3/uL (ref 150–400)
RBC: 4 MIL/uL (ref 3.87–5.11)
RDW: 18.8 % — ABNORMAL HIGH (ref 11.5–15.5)
WBC: 6.5 10*3/uL (ref 4.0–10.5)

## 2013-11-29 LAB — BASIC METABOLIC PANEL
Anion gap: 11 (ref 5–15)
BUN: 13 mg/dL (ref 6–23)
CALCIUM: 9.6 mg/dL (ref 8.4–10.5)
CO2: 24 mEq/L (ref 19–32)
Chloride: 101 mEq/L (ref 96–112)
Creatinine, Ser: 0.62 mg/dL (ref 0.50–1.10)
GFR calc non Af Amer: 87 mL/min — ABNORMAL LOW (ref >90)
GLUCOSE: 87 mg/dL (ref 70–99)
Potassium: 4.2 mEq/L (ref 3.7–5.3)
Sodium: 136 mEq/L — ABNORMAL LOW (ref 137–147)

## 2013-11-29 MED ORDER — NICOTINE 14 MG/24HR TD PT24: 14.0000 mg | MEDICATED_PATCH | Freq: Every day | TRANSDERMAL | Status: DC

## 2013-11-29 MED ORDER — LIDOCAINE 5 % EX PTCH
1.0000 | MEDICATED_PATCH | CUTANEOUS | Status: DC
  Administered 2013-11-29: 1 via TRANSDERMAL
  Filled 2013-11-29: qty 1

## 2013-11-29 MED ORDER — METOCLOPRAMIDE HCL 5 MG PO TABS
5.0000 mg | ORAL_TABLET | Freq: Three times a day (TID) | ORAL | Status: DC
Start: 2013-11-29 — End: 2013-12-13

## 2013-11-29 MED ORDER — LIDOCAINE 5 % EX PTCH: 1.0000 | MEDICATED_PATCH | CUTANEOUS | Status: DC

## 2013-11-29 MED ORDER — SUCRALFATE 1 G PO TABS: 1.0000 g | ORAL_TABLET | Freq: Three times a day (TID) | ORAL | Status: DC

## 2013-11-29 MED ORDER — LISINOPRIL 5 MG PO TABS
5.0000 mg | ORAL_TABLET | Freq: Every day | ORAL | Status: DC
Start: 2013-11-30 — End: 2013-11-29

## 2013-11-29 MED ORDER — TRAMADOL HCL 50 MG PO TABS
50.0000 mg | ORAL_TABLET | Freq: Four times a day (QID) | ORAL | Status: DC | PRN
  Administered 2013-11-29 (×2): 50 mg via ORAL
  Filled 2013-11-29 (×2): qty 1

## 2013-11-29 MED ORDER — PANTOPRAZOLE SODIUM 40 MG PO TBEC: 40.0000 mg | DELAYED_RELEASE_TABLET | Freq: Two times a day (BID) | ORAL | Status: DC

## 2013-11-29 MED ORDER — TRAMADOL HCL 50 MG PO TABS: 50.0000 mg | ORAL_TABLET | Freq: Four times a day (QID) | ORAL | Status: DC | PRN

## 2013-11-29 MED ORDER — FERROUS SULFATE 325 (65 FE) MG PO TABS: 325.0000 mg | ORAL_TABLET | Freq: Two times a day (BID) | ORAL | Status: DC

## 2013-11-29 MED ORDER — LISINOPRIL 5 MG PO TABS
5.0000 mg | ORAL_TABLET | Freq: Every day | ORAL | Status: DC
  Administered 2013-11-29: 5 mg via ORAL
  Filled 2013-11-29: qty 1

## 2013-11-29 MED ORDER — FERROUS SULFATE 325 (65 FE) MG PO TABS
325.0000 mg | ORAL_TABLET | Freq: Two times a day (BID) | ORAL | Status: DC
  Administered 2013-11-29: 325 mg via ORAL
  Filled 2013-11-29 (×2): qty 1

## 2013-11-29 MED ORDER — LISINOPRIL 5 MG PO TABS: 5.0000 mg | ORAL_TABLET | Freq: Every day | ORAL | Status: DC

## 2013-11-29 NOTE — Progress Notes (Signed)
Patient ID: [name], female   DOB: October 28, 1940, 73 y.o.   MRN: [id number] Patient is well this morning. She has no complaints. Her urine remains clear. I reviewed the EGD note and noted the ulcer was biopsied. Pathology pending.  I had a long discussion with the patient and her daughter as far as the CT findings with the bladder. 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 We discussed the nature risks benefits and alternatives to multi-procedures/staged TURBT including cardiopulmonary risks, bleeding, infection, bladder perforation requiring exploratory laparotomy among others. All questions answered and she elects to proceed. I'll have the office set this up in the next couple of weeks. Patient gave me permission to call and schedule with her daughter [name]. I told the patient to focus on eating a well-balanced diet and taking a multivitamin to hopefully improve her anemia prior to the procedure. Hemoglobin was 7.8 today and stable. Kidney function stable.

## 2013-11-29 NOTE — Progress Notes (Signed)
Patient Name: [name] Date of Encounter: 11/29/2013     Principal Problem:   Hyponatremia Active Problems:   Palpitations   Anemia, iron deficiency   Chest pain   Epigastric pain   Bladder cancer   Hematuria   LBBB (left bundle branch block)   Atrial ectopy   Congestive dilated cardiomyopathy   Preoperative cardiovascular examination   Gastric ulcer    SUBJECTIVE  Continue to have some palpitation, but no CP or SOB.  CURRENT MEDS . acetaminophen  650 mg Oral TID  . lidocaine  1 patch Transdermal Q24H  . lisinopril  2.5 mg Oral Daily  . metoCLOPramide  5 mg Oral TID AC  . nicotine  14 mg Transdermal Daily  . pantoprazole  40 mg Oral BID  . sodium chloride  3 mL Intravenous Q12H  . sucralfate  1 g Oral 4 times per day    OBJECTIVE  Filed Vitals:   11/28/13 1105 11/28/13 1115 11/28/13 2040 11/29/13 0607  BP: 126/74 136/73 116/54 146/69  Pulse: 36  77 71  Temp:   97.3 F (36.3 C) 98 F (36.7 C)  TempSrc:   Oral Oral  Resp: 17     Height:      Weight:      SpO2: 97%  97% 98%    Intake/Output Summary (Last 24 hours) at 11/29/13 1026 Last data filed at 11/28/13 2154  Gross per 24 hour  Intake    253 ml  Output      0 ml  Net    253 ml   Filed Weights   11/22/13 2319 11/23/13 0400 11/26/13 0501  Weight: 121 lb 12.8 oz (55.248 kg) 121 lb 12.2 oz (55.23 kg) 124 lb 6.4 oz (56.427 kg)    PHYSICAL EXAM  General: Pleasant, NAD. Neuro: Alert and oriented X 3. Moves all extremities spontaneously. Psych: Normal affect. HEENT:  Normal  Neck: Supple without bruits or JVD. Lungs:  Resp regular and unlabored, CTA. Heart: RRR no s3, s4, or murmurs. Abdomen: Soft, non-tender, non-distended, BS + x 4.  Extremities: No clubbing, cyanosis or edema. DP/PT/Radials 2+ and equal bilaterally.  Accessory Clinical Findings  CBC  Recent Labs  11/29/13 0624  WBC 6.5  HGB 7.8*  HCT 26.6*  MCV 66.5*  PLT 283   Basic Metabolic Panel  Recent Labs  11/29/13 0624  NA 136*  K 4.2  CL 101  CO2 24  GLUCOSE 87  BUN 13  CREATININE 0.62  CALCIUM 9.6   Thyroid Function Tests  Recent Labs  11/27/13 0930  TSH 1.700    TELE NSR with alternating BBB, frequent PACs and PVCs    ECG  No new EKG  Echocardiogram  11/24/2013 LV EF: 20% - 25%  ------------------------------------------------------------------- Indications: Dyspnea 786.09.  ------------------------------------------------------------------- History: PMH: Palpitations. Chest pain.  ------------------------------------------------------------------- Study Conclusions  - Left ventricle: The cavity size was mildly dilated. Wall thickness was normal. Systolic function was severely reduced. The estimated ejection fraction was in the range of 20% to 25%. Diffuse hypokinesis. Doppler parameters are consistent with abnormal left ventricular relaxation (grade 1 diastolic dysfunction). - Mitral valve: There was mild regurgitation. - Left atrium: The atrium was severely dilated. - Pulmonary arteries: Systolic pressure was mildly increased. PA peak pressure: 41 mm Hg (S). - Pericardium, extracardiac: A small pericardial effusion was identified.  Impressions:  - Severe global reduction in LV function; grade 1 diastolic dysfunction; mild MR and TR; mildly elevated pulmonary pressures; small pericardial effusion.  Radiology/Studies  Ct Abdomen Pelvis W Wo Contrast  11/24/2013   CLINICAL DATA:  Gross hematuria with history of bladder cancer 20 years ago.  EXAM: CT ABDOMEN AND PELVIS WITHOUT AND WITH CONTRAST  TECHNIQUE: Multidetector CT imaging of the abdomen and pelvis was performed following the standard protocol before and following the bolus administration of intravenous contrast.  CONTRAST:  183mL OMNIPAQUE IOHEXOL 300 MG/ML  SOLN  COMPARISON:  None.  FINDINGS: Lung bases: Centrilobular emphysema. Subpleural right middle lobe nodular density of 4 mm on image  1. Mild cardiomegaly. Dilated, fluid-filled lower esophagus on image 2 of series 301.  Kidneys / Ureters / Bladder: No renal calculi or hydronephrosis. No hydroureter or ureteric calculi. No bladder calculi. No renal mass on post-contrast images. Moderate renal collecting system opacification on delayed images. The ureters are moderately well opacified on delayed images. Portions of the mid right and distal left ureter are incompletely opacified. No filling defects within the opacified portions.  Multiple enhancing bladder masses are identified. 6.0 x 5.3 cm anteriorly and superiorly on image 58 of series 301. A small right posterior bladder nodule at 8 mm on image 59.  Right paracentral dependent bladder base 2.2 x 2.7 cm lesion on image 70 of series 301. More anterior inferior 9 mm nodule on image 72.  No gross transmural extension identified.  Other: Normal liver, spleen. Gastric antral wall thickening on image 74 of series 301. This is moderate. Adjacent porta hepatis nodes, including a 9 mm node on image 30.  Upper normal pancreatic duct size. No cause identified. Normal gallbladder, biliary tract, adrenal glands.  Aortic and branch vessel atherosclerosis. No retroperitoneal or retrocrural adenopathy. Colonic stool burden suggests constipation. Normal small bowel.  No pelvic adenopathy. Normal uterus without adnexal mass. Small volume pelvic cul-de-sac fluid, of indeterminate etiology.  Bones / Musculoskeletal: Moderate osteopenia. Convex right lumbar spine curvature. Advanced lumbar spondylosis  IMPRESSION: 1. 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 No evidence of metastatic disease. No gross transmural extension. 2. Moderate gastric antral wall thickening. This could represent gastritis or gastric carcinoma. Endoscopy should be considered. Adjacent porta hepatis nodes are indeterminate. If the patient is diagnosed with gastric carcinoma, nodal  metastasis would be a concern. 3. Esophageal air fluid level suggests dysmotility or gastroesophageal reflux. 4.  Possible constipation. 5. Small volume cul-de-sac fluid. 6. Centrilobular emphysema with nonspecific right lung base nodule.   Electronically Signed   By: Abigail Miyamoto M.D.   On: 11/24/2013 11:55   Dg Chest 2 View  11/22/2013   CLINICAL DATA:  Nausea and vomiting.  Sternal and epigastric pain.  EXAM: CHEST - 2 VIEW  COMPARISON:  08/31/2010  FINDINGS: The heart size and mediastinal contours are within normal limits. Stable COPD with mild bilateral pulmonary hyperinflation. There is no evidence of pulmonary edema, consolidation, pneumothorax, nodule or pleural fluid. The visualized skeletal structures are unremarkable.  IMPRESSION: Stable COPD.  No active disease.   Electronically Signed   By: Aletta Edouard M.D.   On: 11/22/2013 19:03   Dg Lumbar Spine 2-3 Views  11/23/2013   CLINICAL DATA:  Lumbar spine pain for 1 year radiating to the buttocks. History of back surgery due to herniated disc  EXAM: LUMBAR SPINE - 2-3 VIEW  COMPARISON:  None.  FINDINGS: There are 5 non rib-bearing lumbar type vertebral bodies.  There is a mild to moderate scoliotic curvature of the thoracolumbar spine with dominant cranial component convex to the right. No  definite anterolisthesis or retrolisthesis.  Lumbar vertebral body heights are preserved given obliquity.  There is moderate to severe multilevel lumbar spine DDD, likely worse at L1-L2, L2-L3 and L4-L5 with disc space height loss, endplate irregularity and sclerosis.  Atherosclerotic plaque within the abdominal aorta.  Bowel-gas pattern regional soft tissues appear normal. No radiopaque foreign body.  IMPRESSION: 1. No definite acute findings. 2. Mild to moderate scoliotic curvature of the thoracolumbar spine with associated moderate to severe multilevel lumbar spine DDD.   Electronically Signed   By: Sandi Mariscal M.D.   On: 11/23/2013 15:02   Nm Myocar Multi  W/spect W/wall Motion / Ef  11/26/2013   CLINICAL DATA:  Chest pain.  Left bundle branch block.  EXAM: MYOCARDIAL IMAGING WITH SPECT (REST AND PHARMACOLOGIC-STRESS - 2 DAY PROTOCOL)  GATED LEFT VENTRICULAR WALL MOTION STUDY  LEFT VENTRICULAR EJECTION FRACTION  TECHNIQUE: Standard myocardial SPECT imaging was performed after resting intravenous injection of 10 mCi Tc-8m sestamibi. Subsequently, on a second day, intravenous infusion of Lexiscan was performed under the supervision of the Cardiology staff. At peak effect of the drug, 30 mCi Tc-51m sestamibi was injected intravenously and standard myocardial SPECT imaging was performed. Quantitative gated imaging was also performed to evaluate left ventricular wall motion, and estimate left ventricular ejection fraction.  COMPARISON:  None.  FINDINGS: Perfusion: Large high severity inferior wall perfusion defect on stress and rest images. Large Mild inferior severity anteroseptal wall hypoperfusion on stress and rest images. No inducible ischemia.  Wall Motion: Global moderate hypokinesis.  Left Ventricular Ejection Fraction: 40% %  End diastolic volume 086 ml  End systolic volume 761 ml  IMPRESSION: 1. Large high severity inferior wall perfusion defect on stress and rest images. Large Mild severity anteroseptal wall perfusion defect on stress and rest images. No inducible ischemia.  2. Global moderate hypokinesis.  3. Left ventricular ejection fraction 28%%  4. High-risk stress test findings*.  *2012 Appropriate Use Criteria for Coronary Revascularization Focused Update: J Am Coll Cardiol. 9509;32(6):712-458. http://[email or web address]?articleid=[email or web address]   Electronically Signed   By: Sherryl Barters M.D.   On: 11/26/2013 11:43    ASSESSMENT AND PLAN  1. Chest pain/epigastric pain   - Dr. Meda Coffee personally reviewed with stress test film, disagree with result, it demonstrated moderate global-appearing hypokinesis without any perfusion defect or  ischemia.   - EGD on 9/10 3cm x 3cm gastric ulcer, likely responsible for her abdominal pain  - stable from cardiology perspective, expect discharge once cleared by GI to follow up with urology as outpatient  2. Newly diagnosed cardiomyopatyh without clinical CHF: EF 20-25% by echo   - based on Myoview personally reviewed by Dr. Meda Coffee, likely nonischemic cardiomyopathy   - added low dose ACEI for cardiomyopathy, increase to 5mg  daily. Will avoid BB for now given alternating bundle branch block  3. Hematuria/bladder CA: urology discussed with patient, plan to proceed with staged TURBT in a few weeks  4. Palpitations with very frequent PACs, short runs of atrial tachycardia, and alternating BBB   - per Dr. Caryl Comes, alternating BBB, no BB for now, may need pacer at some point.  5. Microcytic Anemia  6. Tob Abuse  7. Small pericardial effusion on echo 11/24/13: no evidence of hemodynamic compromise   Signed, Woodward Ku Pager: [phone]   The patient was seen, examined and discussed with [name], PA-C and I agree with the above.   A 73 year old patient with microcytic anemia, epigastric pain, hematuria, h/o bladder cancer with  new diagnosis of non-ischemic dilated cardiomyopathy.  She underwent EGD yesterday with finding of 3cm x 3cm gastric ulcer, likely responsible for her abdominal pain. GI recommended protonix 40 mg po bid x 6 weeks, then daily, sucralfate. Gastric biopsies are pending. Outpatient GI follow up. She is also being seen by urology for possible recurrent bladder cancer seen on CT. She will possibly undergo TURBT.  We will continue to follow her for non-ischemic cardiomyopathy and chronic systolic CHF, we will increase lisinopril to 5 mg po daily. We will avoid betablockers given alternating bundle branch block. She is currently well compensated and there is no contraindication from cardiac standpoint to undergo TURBT. We will be happy to reevaluate her in case her surgery is  postponed.  We will schedule an outpatient appointment.  [name] 11/29/2013

## 2013-11-29 NOTE — Discharge Summary (Signed)
Physician Discharge Summary  [name] ERD:[id number] DOB: June 11, 1940 DOA: 11/22/2013  PCP: Abigail Miyamoto, MD  Admit date: 11/22/2013 Discharge date: 11/30/2013  Time spent: 35 minutes  Recommendations for Outpatient Follow-up:  Need to follow up with GI, cards, EP, and urology Protonix 40 mg po bid x 6 weeks, then decrease to 40 mg po qd, which is to be continued indefinitely. Sucralfate 1 g po QID x 6 weeks, then discontinue. Avoid ASA/NSAIDs CBC on Tuesday and periodically after that No driving 24 hour supervision Home health    Discharge Diagnoses:  Principal Problem:   Hyponatremia Active Problems:   Palpitations   Anemia, iron deficiency   Chest pain   Epigastric pain   Bladder cancer   Hematuria   LBBB (left bundle branch block)   Atrial ectopy   Congestive dilated cardiomyopathy   Preoperative cardiovascular examination   Gastric ulcer   Discharge Condition: improved  Diet recommendation: as tolerated  Filed Weights   11/22/13 2319 11/23/13 0400 11/26/13 0501  Weight: 55.248 kg (121 lb 12.8 oz) 55.23 kg (121 lb 12.2 oz) 56.427 kg (124 lb 6.4 oz)    History of present illness:  [name] is a 73 y.o. female with a remote history of bladder cancer (1995) who presents to the ED with complaints of nausea and vomiting x 2 days with substernal chest pain and epigastric pain. She denies any diarrhea, or hematemesis. She reports that she has had palpitations a feeling as if her heart was pounding. She reports that she had nausea and vomiting for 2 days last week and thought it was due to food poisoning.    Hospital Course:  Hyponatremia, hypovolemic  Resolving  Atrial ectopy with alternating BBB  Newly diagnosed nonischemic cardiomyopathy. Echo shows EF 20%. No CHF. Started on ACE inhibitor. No beta blocker due to alternating BBB. Per Dr. Meda Coffee, who overread Dr. Maximino Sarin reading of myoview no significant perfusion defect, no ischemia with global LV  dysfunction, consistent with nonischemic cardiomyopathy.   Hematuria  Imaging shows multiple masses, likely recurrence of cancer. TURBT within the next 2 weeks after discharge  -urology  Nausea and Vomiting/epigastric pain/abnormalcT: CT shows fluid filled esophagus and gastric antral wall thickening. Improved on ppi, carafate, reglan. EGD shows gastric ulcer with heaped up margins. Biopsy pending.  Iron deficiency anemia: likely from hematuria. stools heme negative. Has not required transfusion. Received IV iron.   Cardiomyopathy. No evidence for acute CHF. Started on ACE inhibitor   Chronic LBP. Schedule tylenol per patient request. Xray shows DDD. Consider MRI as outpatient after multiple medical issues addressed.   Procedures:    Consultations:  GI  Cards  urology  Discharge Exam: Filed Vitals:   11/29/13 1350  BP:   Pulse: 76  Temp:   Resp: 24    General: A+Ox3, NAD Cardiovascular: irr Respiratory: clear  Discharge Instructions You were cared for by a hospitalist during your hospital stay. If you have any questions about your discharge medications or the care you received while you were in the hospital after you are discharged, you can call the unit and asked to speak with the hospitalist on call if the hospitalist that took care of you is not available. Once you are discharged, your primary care physician will handle any further medical issues. Please note that NO REFILLS for any discharge medications will be authorized once you are discharged, as it is imperative that you return to your primary care physician (or establish a relationship  with a primary care physician if you do not have one) for your aftercare needs so that they can reassess your need for medications and monitor your lab values.  Discharge Instructions   Diet general    Complete by:  As directed      Discharge instructions    Complete by:  As directed   Need to follow up with GI, cards, EP, and  urology Protonix 40 mg po bid x 6 weeks, then decrease to 40 mg po qd, which is to be continued indefinitely. Sucralfate 1 g po QID x 6 weeks, then discontinue. Avoid ASA/NSAIDs CBC on Tuesday and periodically after that No driving 24 hour supervision Home health     Discharge instructions    Complete by:  As directed   Home health to draw labs and send to PCP     Increase activity slowly    Complete by:  As directed           Discharge Medication List as of 11/29/2013  3:12 PM    START taking these medications   Details  ferrous sulfate 325 (65 FE) MG tablet Take 1 tablet (325 mg total) by mouth 2 (two) times daily with a meal., Starting 11/29/2013, Until Discontinued, Print    lidocaine (LIDODERM) 5 % Place 1 patch onto the skin daily. Remove & Discard patch within 12 hours or as directed by MD, Starting 11/29/2013, Until Discontinued, Print    lisinopril (PRINIVIL,ZESTRIL) 5 MG tablet Take 1 tablet (5 mg total) by mouth daily., Starting 11/29/2013, Until Discontinued, Print    metoCLOPramide (REGLAN) 5 MG tablet Take 1 tablet (5 mg total) by mouth 3 (three) times daily before meals., Starting 11/29/2013, Until Discontinued, Print    pantoprazole (PROTONIX) 40 MG tablet Take 1 tablet (40 mg total) by mouth 2 (two) times daily., Starting 11/29/2013, Until Discontinued, Print    sucralfate (CARAFATE) 1 G tablet Take 1 tablet (1 g total) by mouth 4 (four) times daily -  with meals and at bedtime., Starting 11/29/2013, Until Discontinued, Print    traMADol (ULTRAM) 50 MG tablet Take 1 tablet (50 mg total) by mouth every 6 (six) hours as needed for moderate pain., Starting 11/29/2013, Until Discontinued, Print      CONTINUE these medications which have NOT CHANGED   Details  acetaminophen (TYLENOL) 500 MG tablet Take 500 mg by mouth every 8 (eight) hours as needed for moderate pain., Until Discontinued, Historical Med    Ca Carbonate-Mag Hydroxide (ROLAIDS) 550-110 MG CHEW Chew 1 tablet  by mouth 2 (two) times daily as needed (for heartburn)., Until Discontinued, Historical Med       No Known Allergies Follow-up Information   Follow up with Kathlyn Sacramento, MD On 12/17/2013. (3:30pm)    Specialty:  Cardiology   Contact information:   [place] [address] [phone]       Follow up with FRIED, Jaymes Graff, MD In 1 week. (cbc on Monday)    Specialty:  Family Medicine   Contact information:   [place] [address] [phone]       Follow up with Landry Dyke, MD.   Specialty:  Gastroenterology   Contact information:   [address] [place] [address] [phone]       Follow up with [name], MD.   Specialty:  Urology   Contact information:   [place] [address] [phone]  Follow up with Virl Axe, MD In 1 month.   Specialty:  Cardiology   Contact information:   [address] [place] [address] [phone]       Follow up with [facility]. (Equities trader, Physical  and Occupational Therapy, Social worker)    Contact information:   [place] [address] [phone]        The results of significant diagnostics from this hospitalization (including imaging, microbiology, ancillary and laboratory) are listed below for reference.    Significant Diagnostic Studies: Ct Abdomen Pelvis W Wo Contrast  11/24/2013   CLINICAL DATA:  Gross hematuria with history of bladder cancer 20 years ago.  EXAM: CT ABDOMEN AND PELVIS WITHOUT AND WITH CONTRAST  TECHNIQUE: Multidetector CT imaging of the abdomen and pelvis was performed following the standard protocol before and following the bolus administration of intravenous contrast.  CONTRAST:  123mL OMNIPAQUE IOHEXOL 300 MG/ML  SOLN  COMPARISON:  None.  FINDINGS: Lung bases: Centrilobular emphysema. Subpleural right middle lobe nodular density of 4 mm on image 1. Mild  cardiomegaly. Dilated, fluid-filled lower esophagus on image 2 of series 301.  Kidneys / Ureters / Bladder: No renal calculi or hydronephrosis. No hydroureter or ureteric calculi. No bladder calculi. No renal mass on post-contrast images. Moderate renal collecting system opacification on delayed images. The ureters are moderately well opacified on delayed images. Portions of the mid right and distal left ureter are incompletely opacified. No filling defects within the opacified portions.  Multiple enhancing bladder masses are identified. 6.0 x 5.3 cm anteriorly and superiorly on image 58 of series 301. A small right posterior bladder nodule at 8 mm on image 59.  Right paracentral dependent bladder base 2.2 x 2.7 cm lesion on image 70 of series 301. More anterior inferior 9 mm nodule on image 72.  No gross transmural extension identified.  Other: Normal liver, spleen. Gastric antral wall thickening on image 74 of series 301. This is moderate. Adjacent porta hepatis nodes, including a 9 mm node on image 30.  Upper normal pancreatic duct size. No cause identified. Normal gallbladder, biliary tract, adrenal glands.  Aortic and branch vessel atherosclerosis. No retroperitoneal or retrocrural adenopathy. Colonic stool burden suggests constipation. Normal small bowel.  No pelvic adenopathy. Normal uterus without adnexal mass. Small volume pelvic cul-de-sac fluid, of indeterminate etiology.  Bones / Musculoskeletal: Moderate osteopenia. Convex right lumbar spine curvature. Advanced lumbar spondylosis  IMPRESSION: 1. 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 No evidence of metastatic disease. No gross transmural extension. 2. Moderate gastric antral wall thickening. This could represent gastritis or gastric carcinoma. Endoscopy should be considered. Adjacent porta hepatis nodes are indeterminate. If the patient is diagnosed with gastric carcinoma, nodal metastasis would  be a concern. 3. Esophageal air fluid level suggests dysmotility or gastroesophageal reflux. 4.  Possible constipation. 5. Small volume cul-de-sac fluid. 6. Centrilobular emphysema with nonspecific right lung base nodule.   Electronically Signed   By: Abigail Miyamoto M.D.   On: 11/24/2013 11:55   Dg Chest 2 View  11/22/2013   CLINICAL DATA:  Nausea and vomiting.  Sternal and epigastric pain.  EXAM: CHEST - 2 VIEW  COMPARISON:  08/31/2010  FINDINGS: The heart size and mediastinal contours are within normal limits. Stable COPD with mild bilateral pulmonary hyperinflation. There is no evidence of pulmonary edema, consolidation, pneumothorax, nodule or pleural fluid. The visualized skeletal structures are unremarkable.  IMPRESSION: Stable  COPD.  No active disease.   Electronically Signed   By: Aletta Edouard M.D.   On: 11/22/2013 19:03   Dg Lumbar Spine 2-3 Views  11/23/2013   CLINICAL DATA:  Lumbar spine pain for 1 year radiating to the buttocks. History of back surgery due to herniated disc  EXAM: LUMBAR SPINE - 2-3 VIEW  COMPARISON:  None.  FINDINGS: There are 5 non rib-bearing lumbar type vertebral bodies.  There is a mild to moderate scoliotic curvature of the thoracolumbar spine with dominant cranial component convex to the right. No definite anterolisthesis or retrolisthesis.  Lumbar vertebral body heights are preserved given obliquity.  There is moderate to severe multilevel lumbar spine DDD, likely worse at L1-L2, L2-L3 and L4-L5 with disc space height loss, endplate irregularity and sclerosis.  Atherosclerotic plaque within the abdominal aorta.  Bowel-gas pattern regional soft tissues appear normal. No radiopaque foreign body.  IMPRESSION: 1. No definite acute findings. 2. Mild to moderate scoliotic curvature of the thoracolumbar spine with associated moderate to severe multilevel lumbar spine DDD.   Electronically Signed   By: Sandi Mariscal M.D.   On: 11/23/2013 15:02   Nm Myocar Multi W/spect W/wall Motion  / Ef  11/26/2013   CLINICAL DATA:  Chest pain.  Left bundle branch block.  EXAM: MYOCARDIAL IMAGING WITH SPECT (REST AND PHARMACOLOGIC-STRESS - 2 DAY PROTOCOL)  GATED LEFT VENTRICULAR WALL MOTION STUDY  LEFT VENTRICULAR EJECTION FRACTION  TECHNIQUE: Standard myocardial SPECT imaging was performed after resting intravenous injection of 10 mCi Tc-48m sestamibi. Subsequently, on a second day, intravenous infusion of Lexiscan was performed under the supervision of the Cardiology staff. At peak effect of the drug, 30 mCi Tc-50m sestamibi was injected intravenously and standard myocardial SPECT imaging was performed. Quantitative gated imaging was also performed to evaluate left ventricular wall motion, and estimate left ventricular ejection fraction.  COMPARISON:  None.  FINDINGS: Perfusion: Large high severity inferior wall perfusion defect on stress and rest images. Large Mild inferior severity anteroseptal wall hypoperfusion on stress and rest images. No inducible ischemia.  Wall Motion: Global moderate hypokinesis.  Left Ventricular Ejection Fraction: 33% %  End diastolic volume 007 ml  End systolic volume 622 ml  IMPRESSION: 1. Large high severity inferior wall perfusion defect on stress and rest images. Large Mild severity anteroseptal wall perfusion defect on stress and rest images. No inducible ischemia.  2. Global moderate hypokinesis.  3. Left ventricular ejection fraction 28%%  4. High-risk stress test findings*.  *2012 Appropriate Use Criteria for Coronary Revascularization Focused Update: J Am Coll Cardiol. 6333;54(5):625-638. http://content.airportbarriers.com.aspx?articleid=1201161   Electronically Signed   By: Sherryl Barters M.D.   On: 11/26/2013 11:43    Microbiology: Recent Results (from the past 240 hour(s))  URINE CULTURE     Status: None   Collection Time    11/23/13  2:26 AM      Result Value Ref Range Status   Specimen Description URINE, RANDOM   Final   Special Requests NONE   Final    Culture  Setup Time     Final   Value: 11/23/2013 10:19     Performed at [facility]     Final   Value: NO GROWTH     Performed at [facility]   Culture     Final   Value: NO GROWTH     Performed at [facility]   Report Status 11/24/2013 FINAL   Final  Labs: Basic Metabolic Panel:  Recent Labs Lab 11/24/13 0305 11/26/13 0459 11/29/13 0624  NA 132* 135* 136*  K 3.8 4.3 4.2  CL 98 97 101  CO2 25 26 24   GLUCOSE 116* 87 87  BUN 14 12 13   CREATININE 0.60 0.61 0.62  CALCIUM 8.1* 8.7 9.6   Liver Function Tests: No results found for this basename: AST, ALT, ALKPHOS, BILITOT, PROT, ALBUMIN,  in the last 168 hours No results found for this basename: LIPASE, AMYLASE,  in the last 168 hours No results found for this basename: AMMONIA,  in the last 168 hours CBC:  Recent Labs Lab 11/24/13 0305 11/25/13 0210 11/26/13 0459 11/29/13 0624  WBC 6.6  --  6.9 6.5  HGB 7.6* 7.5* 7.6* 7.8*  HCT 24.5* 24.5* 25.4* 26.6*  MCV 67.5*  --  67.9* 66.5*  PLT 274  --  288 356   Cardiac Enzymes: No results found for this basename: CKTOTAL, CKMB, CKMBINDEX, TROPONINI,  in the last 168 hours BNP: BNP (last 3 results) No results found for this basename: PROBNP,  in the last 8760 hours CBG: No results found for this basename: GLUCAP,  in the last 168 hours     Signed:  Eulogio Bear  [facility] 11/30/2013, 2:07 PM

## 2013-11-29 NOTE — Progress Notes (Signed)
Subjective: Abdominal pain resolving. No blood in stool.  Objective: Vital signs in last 24 hours: Temp:  [97.3 F (36.3 C)-98 F (36.7 C)] 98 F (36.7 C) (09/11 0607) Pulse Rate:  [36-77] 71 (09/11 0607) Resp:  [17] 17 (09/10 1105) BP: (99-146)/(54-74) 146/69 mmHg (09/11 0607) SpO2:  [97 %-98 %] 98 % (09/11 0607) Weight change:  Last BM Date: 11/28/13  PE: GEN:  NAD ABD:  Soft  Lab Results: CBC    Component Value Date/Time   WBC 6.5 11/29/2013 0624   RBC 4.00 11/29/2013 0624   HGB 7.8* 11/29/2013 0624   HCT 26.6* 11/29/2013 0624   PLT 356 11/29/2013 0624   MCV 66.5* 11/29/2013 0624   MCH 19.5* 11/29/2013 0624   MCHC 29.3* 11/29/2013 0624   RDW 18.8* 11/29/2013 0624   LYMPHSABS 1.8 08/31/2010 0952   MONOABS 0.9 08/31/2010 0952   EOSABS 0.1 08/31/2010 0952   BASOSABS 0.0 08/31/2010 0952   CMP     Component Value Date/Time   NA 136* 11/29/2013 0624   K 4.2 11/29/2013 0624   CL 101 11/29/2013 0624   CO2 24 11/29/2013 0624   GLUCOSE 87 11/29/2013 0624   BUN 13 11/29/2013 0624   CREATININE 0.62 11/29/2013 0624   CALCIUM 9.6 11/29/2013 0624   PROT 6.9 11/22/2013 1653   ALBUMIN 3.7 11/22/2013 1653   AST 26 11/22/2013 1653   ALT 14 11/22/2013 1653   ALKPHOS 63 11/22/2013 1653   BILITOT 0.4 11/22/2013 1653   GFRNONAA 87* 11/29/2013 0624   GFRAA >90 11/29/2013 0624   Assessment:  1.  Bladder cancer. 2.  Abnormal CT scan abdomen, gastric thickening. 3.  Antral ulcer on endoscopy, accounting for finding on CT, biopsies pending. 4.  Anemia, likely multifactorial, but gastric ulcer could certainly be contributing.  No evidence of active GI bleeding at present.  Plan:  1.  Protonix 40 mg po bid x 6 weeks, then decrease to 40 mg po qd, which is to be continued indefinitely. 2.  Sucralfate 1 g po QID x 6 weeks, then discontinue. 3.  Avoid ASA/NSAIDs, as clinically permissible. 4.  Awaiting gastric biopsy results. 5.  Will arrange outpatient GI follow-up with me. 6.  Will sign-off; please call  with questions; thank you for the consult.   Landry Dyke 11/29/2013, 10:43 AM

## 2013-11-29 NOTE — Evaluation (Signed)
Physical Therapy Evaluation Patient Details Name: [name] MRN: [id number] DOB: [birth year] Today's Date: 11/29/2013   History of Present Illness  [name] is a 73 y.o. female with a remote history of bladder cancer (1995) who presents to the ED with complaints of nausea and vomiting x 2 days with substernal chest pain and epigastric pain.  Found to have antral ulcer per EGD and new dx cardiomyopathy with EF 20-25% as well as recurrence of bladder cancer.  Clinical Impression  Patient presents with decreased independence with mobility due to deficits listed in PT problem list.  She will benefit from skilled PT during acute stay to allow return to independent.  Plans to stay with daughter temporarily and will benefit from [place].    Follow Up Recommendations Home health PT;Supervision/Assistance - 24 hour    Equipment Recommendations  None recommended by PT    Recommendations for Other Services       Precautions / Restrictions Precautions Precautions: Fall      Mobility  Bed Mobility               General bed mobility comments: NT, pt sitting edge of bed  Transfers Overall transfer level: Modified independent                  Ambulation/Gait Ambulation/Gait assistance: Supervision;Min guard Ambulation Distance (Feet): 200 Feet   Gait Pattern/deviations: Step-through pattern     General Gait Details: patient mildly unsteady with ambulation in hallway; educated on fall risk reduction techniques for home  Stairs            Wheelchair Mobility    Modified Rankin (Stroke Patients Only)       Balance Overall balance assessment: Needs assistance           Standing balance-Leahy Scale: Fair                               Pertinent Vitals/Pain Pain Assessment: Faces Faces Pain Scale: Hurts a little bit Pain Location: lower back Pain Intervention(s): Monitored during session;Premedicated before session    August expects to be discharged to:: Private residence Living Arrangements: Children Available Help at Discharge: Family;Available 24 hours/day Type of Home: House Home Access: Stairs to enter Entrance Stairs-Rails: None Entrance Stairs-Number of Steps: 2 Home Layout: Two level;Able to live on main level with bedroom/bathroom Home Equipment: None      Prior Function Level of Independence: Independent         Comments: was living along prior to hospitalization; plans to stay with daughter till able to manage independent     Hand Dominance        Extremity/Trunk Assessment               Lower Extremity Assessment: Generalized weakness         Communication   Communication: No difficulties  Cognition Arousal/Alertness: Awake/alert Behavior During Therapy: WFL for tasks assessed/performed Overall Cognitive Status: Within Functional Limits for tasks assessed                      General Comments      Exercises        Assessment/Plan    PT Assessment Patient needs continued PT services  PT Diagnosis Generalized weakness;Abnormality of gait   PT Problem List Decreased strength;Decreased activity tolerance;Decreased balance;Decreased mobility  PT Treatment Interventions Gait training;DME instruction;Therapeutic exercise;Balance training;Functional mobility  training;Therapeutic activities;Patient/family education   PT Goals (Current goals can be found in the Care Plan section) Acute Rehab PT Goals Patient Stated Goal: To return to independent PT Goal Formulation: With patient/family Time For Goal Achievement: 12/13/13 Potential to Achieve Goals: Good    Frequency Min 3X/week   Barriers to discharge        Co-evaluation               End of Session Equipment Utilized During Treatment: Gait belt Activity Tolerance: Patient tolerated treatment well Patient left: in bed;with call bell/phone within reach;with family/visitor  present           Time: 1335-1400 PT Time Calculation (min): 25 min   Charges:   PT Evaluation $Initial PT Evaluation Tier I: 1 Procedure PT Treatments $Gait Training: 8-22 mins   PT G Codes:          Jamaiya Tunnell,CYNDI 2013-12-14, 2:30 PM Magda Kiel, [place] 14-Dec-2013

## 2013-12-02 ENCOUNTER — Other Ambulatory Visit: Payer: Self-pay | Admitting: Urology

## 2013-12-02 DIAGNOSIS — D494 Neoplasm of unspecified behavior of bladder: Secondary | ICD-10-CM | POA: Diagnosis not present

## 2013-12-02 DIAGNOSIS — K219 Gastro-esophageal reflux disease without esophagitis: Secondary | ICD-10-CM | POA: Diagnosis not present

## 2013-12-02 DIAGNOSIS — I428 Other cardiomyopathies: Secondary | ICD-10-CM | POA: Diagnosis not present

## 2013-12-02 DIAGNOSIS — J449 Chronic obstructive pulmonary disease, unspecified: Secondary | ICD-10-CM | POA: Diagnosis not present

## 2013-12-02 DIAGNOSIS — K259 Gastric ulcer, unspecified as acute or chronic, without hemorrhage or perforation: Secondary | ICD-10-CM | POA: Diagnosis not present

## 2013-12-02 DIAGNOSIS — I420 Dilated cardiomyopathy: Secondary | ICD-10-CM | POA: Diagnosis not present

## 2013-12-02 DIAGNOSIS — R269 Unspecified abnormalities of gait and mobility: Secondary | ICD-10-CM | POA: Diagnosis not present

## 2013-12-03 ENCOUNTER — Encounter (HOSPITAL_COMMUNITY): Payer: Self-pay | Admitting: *Deleted

## 2013-12-03 DIAGNOSIS — K5900 Constipation, unspecified: Secondary | ICD-10-CM | POA: Diagnosis not present

## 2013-12-03 DIAGNOSIS — D500 Iron deficiency anemia secondary to blood loss (chronic): Secondary | ICD-10-CM | POA: Diagnosis not present

## 2013-12-03 DIAGNOSIS — K259 Gastric ulcer, unspecified as acute or chronic, without hemorrhage or perforation: Secondary | ICD-10-CM | POA: Diagnosis not present

## 2013-12-03 DIAGNOSIS — S91109A Unspecified open wound of unspecified toe(s) without damage to nail, initial encounter: Secondary | ICD-10-CM | POA: Diagnosis not present

## 2013-12-03 DIAGNOSIS — M545 Low back pain, unspecified: Secondary | ICD-10-CM | POA: Diagnosis not present

## 2013-12-04 DIAGNOSIS — K259 Gastric ulcer, unspecified as acute or chronic, without hemorrhage or perforation: Secondary | ICD-10-CM | POA: Diagnosis not present

## 2013-12-04 DIAGNOSIS — D494 Neoplasm of unspecified behavior of bladder: Secondary | ICD-10-CM | POA: Diagnosis not present

## 2013-12-04 DIAGNOSIS — J449 Chronic obstructive pulmonary disease, unspecified: Secondary | ICD-10-CM | POA: Diagnosis not present

## 2013-12-04 DIAGNOSIS — R269 Unspecified abnormalities of gait and mobility: Secondary | ICD-10-CM | POA: Diagnosis not present

## 2013-12-04 DIAGNOSIS — K219 Gastro-esophageal reflux disease without esophagitis: Secondary | ICD-10-CM | POA: Diagnosis not present

## 2013-12-04 DIAGNOSIS — I420 Dilated cardiomyopathy: Secondary | ICD-10-CM | POA: Diagnosis not present

## 2013-12-04 NOTE — Addendum Note (Signed)
Addendum created 12/04/13 0925 by Laurie Panda, MD   Modules edited: Anesthesia Responsible Staff

## 2013-12-05 ENCOUNTER — Telehealth: Payer: Self-pay | Admitting: Internal Medicine

## 2013-12-05 ENCOUNTER — Encounter (HOSPITAL_COMMUNITY): Payer: Self-pay | Admitting: *Deleted

## 2013-12-05 DIAGNOSIS — K219 Gastro-esophageal reflux disease without esophagitis: Secondary | ICD-10-CM | POA: Diagnosis not present

## 2013-12-05 DIAGNOSIS — R269 Unspecified abnormalities of gait and mobility: Secondary | ICD-10-CM | POA: Diagnosis not present

## 2013-12-05 DIAGNOSIS — I420 Dilated cardiomyopathy: Secondary | ICD-10-CM | POA: Diagnosis not present

## 2013-12-05 DIAGNOSIS — J449 Chronic obstructive pulmonary disease, unspecified: Secondary | ICD-10-CM | POA: Diagnosis not present

## 2013-12-05 DIAGNOSIS — D494 Neoplasm of unspecified behavior of bladder: Secondary | ICD-10-CM | POA: Diagnosis not present

## 2013-12-05 DIAGNOSIS — K259 Gastric ulcer, unspecified as acute or chronic, without hemorrhage or perforation: Secondary | ICD-10-CM | POA: Diagnosis not present

## 2013-12-05 NOTE — Telephone Encounter (Signed)
°  Patient wants to know what her daily intake of sodium should be, please call and advise.

## 2013-12-05 NOTE — Telephone Encounter (Signed)
This pt was initially seen by Dr Meda Coffee in Consult and also throughout her hospitalization for hyponatremia. The pt is scheduled to see Dr Fletcher Anon in the [facility] office for follow-up and he has not met this pt at this time. I will forward this message to Dr Francesca Oman nurse to see if Dr Meda Coffee can give a recommendation on daily sodium intake.

## 2013-12-05 NOTE — Telephone Encounter (Signed)
She should follow low sodium diet as instructed for CHF patients, if you could email her this article from [place]: [email or web address] Thank you

## 2013-12-06 DIAGNOSIS — K219 Gastro-esophageal reflux disease without esophagitis: Secondary | ICD-10-CM | POA: Diagnosis not present

## 2013-12-06 DIAGNOSIS — K259 Gastric ulcer, unspecified as acute or chronic, without hemorrhage or perforation: Secondary | ICD-10-CM | POA: Diagnosis not present

## 2013-12-06 DIAGNOSIS — R269 Unspecified abnormalities of gait and mobility: Secondary | ICD-10-CM | POA: Diagnosis not present

## 2013-12-06 DIAGNOSIS — D494 Neoplasm of unspecified behavior of bladder: Secondary | ICD-10-CM | POA: Diagnosis not present

## 2013-12-06 DIAGNOSIS — I420 Dilated cardiomyopathy: Secondary | ICD-10-CM | POA: Diagnosis not present

## 2013-12-06 DIAGNOSIS — J449 Chronic obstructive pulmonary disease, unspecified: Secondary | ICD-10-CM | POA: Diagnosis not present

## 2013-12-06 NOTE — Telephone Encounter (Signed)
LMTCB about low sodium diet recommendation for CHF pts per Dr Meda Coffee.  Will forward to primary nurse to continue to follow-up

## 2013-12-06 NOTE — Telephone Encounter (Signed)
Routing to appropriate nurse to address

## 2013-12-06 NOTE — Telephone Encounter (Signed)
Pts daughter contacted and I provided pt education on CHF pts and low sodium diet, and gave her Dr Francesca Oman recommendations and website.  Daughter verbalized understanding and very pleased with all the assistance provided.

## 2013-12-06 NOTE — Telephone Encounter (Signed)
Follow up     Daughter want to know how much sodium patient can have on a daily basis?

## 2013-12-09 DIAGNOSIS — K259 Gastric ulcer, unspecified as acute or chronic, without hemorrhage or perforation: Secondary | ICD-10-CM | POA: Diagnosis not present

## 2013-12-09 DIAGNOSIS — J449 Chronic obstructive pulmonary disease, unspecified: Secondary | ICD-10-CM | POA: Diagnosis not present

## 2013-12-09 DIAGNOSIS — K219 Gastro-esophageal reflux disease without esophagitis: Secondary | ICD-10-CM | POA: Diagnosis not present

## 2013-12-09 DIAGNOSIS — I420 Dilated cardiomyopathy: Secondary | ICD-10-CM | POA: Diagnosis not present

## 2013-12-09 DIAGNOSIS — D494 Neoplasm of unspecified behavior of bladder: Secondary | ICD-10-CM | POA: Diagnosis not present

## 2013-12-09 DIAGNOSIS — R269 Unspecified abnormalities of gait and mobility: Secondary | ICD-10-CM | POA: Diagnosis not present

## 2013-12-10 DIAGNOSIS — K219 Gastro-esophageal reflux disease without esophagitis: Secondary | ICD-10-CM | POA: Diagnosis not present

## 2013-12-10 DIAGNOSIS — D494 Neoplasm of unspecified behavior of bladder: Secondary | ICD-10-CM | POA: Diagnosis not present

## 2013-12-10 DIAGNOSIS — I420 Dilated cardiomyopathy: Secondary | ICD-10-CM | POA: Diagnosis not present

## 2013-12-10 DIAGNOSIS — J449 Chronic obstructive pulmonary disease, unspecified: Secondary | ICD-10-CM | POA: Diagnosis not present

## 2013-12-10 DIAGNOSIS — K259 Gastric ulcer, unspecified as acute or chronic, without hemorrhage or perforation: Secondary | ICD-10-CM | POA: Diagnosis not present

## 2013-12-10 DIAGNOSIS — R269 Unspecified abnormalities of gait and mobility: Secondary | ICD-10-CM | POA: Diagnosis not present

## 2013-12-13 ENCOUNTER — Ambulatory Visit (HOSPITAL_COMMUNITY)
Admission: RE | Admit: 2013-12-13 | Discharge: 2013-12-14 | Disposition: A | Discharge status: Home / Self Care | Payer: Medicare Other | Source: Ambulatory Visit | Attending: Urology | Admitting: Urology

## 2013-12-13 ENCOUNTER — Encounter (HOSPITAL_COMMUNITY)
Admission: RE | Disposition: A | Discharge status: Home / Self Care | Payer: Self-pay | Source: Ambulatory Visit | Attending: Urology

## 2013-12-13 ENCOUNTER — Encounter (HOSPITAL_COMMUNITY): Payer: Medicare Other | Admitting: Anesthesiology

## 2013-12-13 ENCOUNTER — Encounter (HOSPITAL_COMMUNITY): Payer: Self-pay | Admitting: *Deleted

## 2013-12-13 ENCOUNTER — Ambulatory Visit (HOSPITAL_COMMUNITY): Payer: Medicare Other | Admitting: Anesthesiology

## 2013-12-13 DIAGNOSIS — C675 Malignant neoplasm of bladder neck: Secondary | ICD-10-CM | POA: Insufficient documentation

## 2013-12-13 DIAGNOSIS — Z79899 Other long term (current) drug therapy: Secondary | ICD-10-CM | POA: Diagnosis not present

## 2013-12-13 DIAGNOSIS — C674 Malignant neoplasm of posterior wall of bladder: Secondary | ICD-10-CM | POA: Diagnosis not present

## 2013-12-13 DIAGNOSIS — K219 Gastro-esophageal reflux disease without esophagitis: Secondary | ICD-10-CM | POA: Insufficient documentation

## 2013-12-13 DIAGNOSIS — N3020 Other chronic cystitis without hematuria: Secondary | ICD-10-CM | POA: Diagnosis not present

## 2013-12-13 DIAGNOSIS — D414 Neoplasm of uncertain behavior of bladder: Secondary | ICD-10-CM | POA: Diagnosis not present

## 2013-12-13 DIAGNOSIS — N8111 Cystocele, midline: Secondary | ICD-10-CM | POA: Diagnosis not present

## 2013-12-13 DIAGNOSIS — D494 Neoplasm of unspecified behavior of bladder: Secondary | ICD-10-CM | POA: Diagnosis present

## 2013-12-13 DIAGNOSIS — F17200 Nicotine dependence, unspecified, uncomplicated: Secondary | ICD-10-CM | POA: Insufficient documentation

## 2013-12-13 DIAGNOSIS — I447 Left bundle-branch block, unspecified: Secondary | ICD-10-CM | POA: Diagnosis not present

## 2013-12-13 HISTORY — PX: TRANSURETHRAL RESECTION OF BLADDER TUMOR: SHX2575

## 2013-12-13 HISTORY — DX: Gastro-esophageal reflux disease without esophagitis: K21.9

## 2013-12-13 LAB — CBC
HCT: 31.5 % — ABNORMAL LOW (ref 36.0–46.0)
HEMOGLOBIN: 9.9 g/dL — AB (ref 12.0–15.0)
MCH: 23 pg — ABNORMAL LOW (ref 26.0–34.0)
MCHC: 31.4 g/dL (ref 30.0–36.0)
MCV: 73.1 fL — ABNORMAL LOW (ref 78.0–100.0)
PLATELETS: 317 10*3/uL (ref 150–400)
RBC: 4.31 MIL/uL (ref 3.87–5.11)
RDW: 29 % — ABNORMAL HIGH (ref 11.5–15.5)
WBC: 5.7 10*3/uL (ref 4.0–10.5)

## 2013-12-13 SURGERY — TURBT (TRANSURETHRAL RESECTION OF BLADDER TUMOR)
Anesthesia: General

## 2013-12-13 MED ORDER — LACTATED RINGERS IV SOLN
INTRAVENOUS | Status: DC | PRN
  Administered 2013-12-13 (×2): via INTRAVENOUS

## 2013-12-13 MED ORDER — CIPROFLOXACIN HCL 500 MG PO TABS
500.0000 mg | ORAL_TABLET | Freq: Two times a day (BID) | ORAL | Status: AC
  Administered 2013-12-13: 500 mg via ORAL
  Filled 2013-12-13 (×2): qty 1

## 2013-12-13 MED ORDER — CEFAZOLIN SODIUM-DEXTROSE 2-3 GM-% IV SOLR
INTRAVENOUS | Status: AC
  Filled 2013-12-13: qty 50

## 2013-12-13 MED ORDER — FENTANYL CITRATE 0.05 MG/ML IJ SOLN
INTRAMUSCULAR | Status: AC
  Filled 2013-12-13: qty 2

## 2013-12-13 MED ORDER — BELLADONNA ALKALOIDS-OPIUM 16.2-60 MG RE SUPP
RECTAL | Status: AC
  Filled 2013-12-13: qty 1

## 2013-12-13 MED ORDER — OXYCODONE-ACETAMINOPHEN 5-325 MG PO TABS
1.0000 | ORAL_TABLET | ORAL | Status: DC | PRN
  Administered 2013-12-14: 1 via ORAL
  Filled 2013-12-13: qty 1

## 2013-12-13 MED ORDER — SODIUM CHLORIDE 0.9 % IR SOLN
Status: DC | PRN
  Administered 2013-12-13: 30000 mL

## 2013-12-13 MED ORDER — PROPOFOL 10 MG/ML IV BOLUS
INTRAVENOUS | Status: AC
  Filled 2013-12-13: qty 20

## 2013-12-13 MED ORDER — CEFAZOLIN SODIUM-DEXTROSE 2-3 GM-% IV SOLR
2.0000 g | INTRAVENOUS | Status: AC
  Administered 2013-12-13: 2 g via INTRAVENOUS

## 2013-12-13 MED ORDER — FENTANYL CITRATE 0.05 MG/ML IJ SOLN
25.0000 ug | INTRAMUSCULAR | Status: DC | PRN
  Administered 2013-12-13 (×2): 25 ug via INTRAVENOUS

## 2013-12-13 MED ORDER — PROPOFOL INFUSION 10 MG/ML OPTIME
INTRAVENOUS | Status: DC | PRN
  Administered 2013-12-13: 100 ug/kg/min via INTRAVENOUS

## 2013-12-13 MED ORDER — LISINOPRIL 5 MG PO TABS
5.0000 mg | ORAL_TABLET | Freq: Every day | ORAL | Status: DC
  Administered 2013-12-13 – 2013-12-14 (×2): 5 mg via ORAL
  Filled 2013-12-13 (×3): qty 1

## 2013-12-13 MED ORDER — PROPOFOL 10 MG/ML IV BOLUS
INTRAVENOUS | Status: DC | PRN
  Administered 2013-12-13: 30 mg via INTRAVENOUS

## 2013-12-13 MED ORDER — NICOTINE 14 MG/24HR TD PT24
14.0000 mg | MEDICATED_PATCH | Freq: Every day | TRANSDERMAL | Status: DC
Start: 2013-12-13 — End: 2013-12-14
  Administered 2013-12-13 – 2013-12-14 (×2): 14 mg via TRANSDERMAL
  Filled 2013-12-13 (×3): qty 1

## 2013-12-13 MED ORDER — FENTANYL CITRATE 0.05 MG/ML IJ SOLN
INTRAMUSCULAR | Status: DC | PRN
  Administered 2013-12-13 (×2): 50 ug via INTRAVENOUS

## 2013-12-13 MED ORDER — METOCLOPRAMIDE HCL 5 MG PO TABS
5.0000 mg | ORAL_TABLET | Freq: Three times a day (TID) | ORAL | Status: DC
  Administered 2013-12-14: 5 mg via ORAL
  Filled 2013-12-13 (×4): qty 1

## 2013-12-13 MED ORDER — TRAMADOL HCL 50 MG PO TABS
50.0000 mg | ORAL_TABLET | Freq: Four times a day (QID) | ORAL | Status: DC | PRN
  Administered 2013-12-14: 50 mg via ORAL
  Filled 2013-12-13: qty 1

## 2013-12-13 MED ORDER — LACTATED RINGERS IV SOLN: INTRAVENOUS | Status: DC

## 2013-12-13 MED ORDER — HYOSCYAMINE SULFATE 0.125 MG SL SUBL
0.1250 mg | SUBLINGUAL_TABLET | SUBLINGUAL | Status: DC | PRN
  Filled 2013-12-13: qty 1

## 2013-12-13 MED ORDER — SODIUM CHLORIDE 0.9 % IJ SOLN: 3.0000 mL | INTRAMUSCULAR | Status: DC | PRN

## 2013-12-13 MED ORDER — SODIUM CHLORIDE 0.9 % IV SOLN: 250.0000 mL | INTRAVENOUS | Status: DC | PRN

## 2013-12-13 MED ORDER — PROMETHAZINE HCL 25 MG/ML IJ SOLN
6.2500 mg | INTRAMUSCULAR | Status: DC | PRN
  Administered 2013-12-13: 6.25 mg via INTRAVENOUS

## 2013-12-13 MED ORDER — SUCRALFATE 1 G PO TABS
1.0000 g | ORAL_TABLET | Freq: Three times a day (TID) | ORAL | Status: DC
  Administered 2013-12-13 – 2013-12-14 (×2): 1 g via ORAL
  Filled 2013-12-13 (×7): qty 1

## 2013-12-13 MED ORDER — PANTOPRAZOLE SODIUM 40 MG PO TBEC
40.0000 mg | DELAYED_RELEASE_TABLET | Freq: Two times a day (BID) | ORAL | Status: DC
  Filled 2013-12-13 (×2): qty 1

## 2013-12-13 MED ORDER — MEPERIDINE HCL 50 MG/ML IJ SOLN: 6.2500 mg | INTRAMUSCULAR | Status: DC | PRN

## 2013-12-13 MED ORDER — PHENYLEPHRINE HCL 10 MG/ML IJ SOLN
INTRAMUSCULAR | Status: AC
  Filled 2013-12-13: qty 2

## 2013-12-13 MED ORDER — 0.9 % SODIUM CHLORIDE (POUR BTL) OPTIME
TOPICAL | Status: DC | PRN
  Administered 2013-12-13: 1000 mL

## 2013-12-13 MED ORDER — ACETAMINOPHEN 500 MG PO TABS: 500.0000 mg | ORAL_TABLET | Freq: Three times a day (TID) | ORAL | Status: DC | PRN

## 2013-12-13 MED ORDER — BELLADONNA ALKALOIDS-OPIUM 16.2-60 MG RE SUPP
RECTAL | Status: DC | PRN
  Administered 2013-12-13: 1 via RECTAL

## 2013-12-13 MED ORDER — SODIUM CHLORIDE 0.9 % IJ SOLN
3.0000 mL | Freq: Two times a day (BID) | INTRAMUSCULAR | Status: DC
  Administered 2013-12-14: 3 mL via INTRAVENOUS

## 2013-12-13 MED ORDER — LACTATED RINGERS IV SOLN
INTRAVENOUS | Status: DC
  Administered 2013-12-13: 1000 mL via INTRAVENOUS

## 2013-12-13 MED ORDER — BUPIVACAINE IN DEXTROSE 0.75-8.25 % IT SOLN
INTRATHECAL | Status: DC | PRN
  Administered 2013-12-13: 1.5 mL via INTRATHECAL

## 2013-12-13 MED ORDER — PHENYLEPHRINE HCL 10 MG/ML IJ SOLN
20.0000 mg | INTRAVENOUS | Status: DC | PRN
  Administered 2013-12-13: 25 ug/min via INTRAVENOUS

## 2013-12-13 MED ORDER — PROMETHAZINE HCL 25 MG/ML IJ SOLN
INTRAMUSCULAR | Status: AC
  Filled 2013-12-13: qty 1

## 2013-12-13 SURGICAL SUPPLY — 16 items
BAG URINE DRAINAGE (UROLOGICAL SUPPLIES) IMPLANT
BAG URO CATCHER STRL LF (DRAPE) ×3 IMPLANT
DRAPE CAMERA CLOSED 9X96 (DRAPES) ×3 IMPLANT
ELECT BUTTON HF 24-28F 2 30DE (ELECTRODE) ×3 IMPLANT
ELECT LOOP MED HF 24F 12D (CUTTING LOOP) ×3 IMPLANT
ELECT LOOP MED HF 24F 12D CBL (CLIP) ×3 IMPLANT
ELECT REM PT RETURN 9FT ADLT (ELECTROSURGICAL) ×3
ELECT RESECT VAPORIZE 12D CBL (ELECTRODE) IMPLANT
ELECTRODE REM PT RTRN 9FT ADLT (ELECTROSURGICAL) ×1 IMPLANT
GLOVE BIOGEL M STRL SZ7.5 (GLOVE) ×3 IMPLANT
GOWN STRL REUS W/TWL XL LVL3 (GOWN DISPOSABLE) ×3 IMPLANT
KIT ASPIRATION TUBING (SET/KITS/TRAYS/PACK) IMPLANT
MANIFOLD NEPTUNE II (INSTRUMENTS) ×3 IMPLANT
PACK CYSTO (CUSTOM PROCEDURE TRAY) ×3 IMPLANT
TUBING CONNECTING 10 (TUBING) ×2 IMPLANT
TUBING CONNECTING 10' (TUBING) ×1

## 2013-12-13 NOTE — Transfer of Care (Signed)
Immediate Anesthesia Transfer of Care Note  Patient: [name]  Procedure(s) Performed: Procedure(s) (LRB): TRANSURETHRAL RESECTION OF BLADDER TUMOR (TURBT) (N/A)  Patient Location: PACU  Anesthesia Type: Spinal  Level of Consciousness: sedated, patient cooperative and responds to stimulation  Airway & Oxygen Therapy: Patient Spontanous Breathing and Patient connected to face mask oxgen  Post-op Assessment: Report given to PACU RN and Post -op Vital signs reviewed and stable  Post vital signs: Reviewed and stable  Complications: No apparent anesthesia complications

## 2013-12-13 NOTE — Discharge Instructions (Signed)
Cystoscopy, Care After   Refer to this sheet in the next few weeks. These instructions provide you with information on caring for yourself after your procedure. Your caregiver may also give you more specific instructions. Your treatment has been planned according to current medical practices, but problems sometimes occur. Call your caregiver if you have any problems or questions after your procedure.   HOME CARE INSTRUCTIONS   Things you can do to ease any discomfort after your procedure include:   Drinking enough water and fluids to keep your urine clear or pale yellow.   Taking a warm bath to relieve any burning feelings.  SEEK IMMEDIATE MEDICAL CARE IF:   You have an increase in blood in your urine.   You notice blood clots in your urine.   You have difficulty passing urine.   You have the chills.   You have abdominal pain.   You have a fever or persistent symptoms for more than 2-3 days.   You have a fever and your symptoms suddenly get worse.  MAKE SURE YOU:   Understand these instructions.   Will watch your condition.   Will get help right away if you are not doing well or get worse.  Document Released: 09/24/2004 Document Revised: 11/07/2012 Document Reviewed: 08/29/2011   ExitCare® Patient Information ©2015 [employer]. This information is not intended to replace advice given to you by your health care provider. Make sure you discuss any questions you have with your health care provider.

## 2013-12-13 NOTE — Op Note (Signed)
Preoperative diagnosis: Bladder neoplasm Postoperative diagnosis: Bladder neoplasm  Procedure:   Exam under anesthesia  TURBT greater than 5 cm  Surgeon: Junious Silk  Anesthesia: [place]  Type of anesthesia: Gen.  Indication for procedure: Patient is a 73 year old female with multiple bladder neoplasms on CT.  Findings:  On exam under anesthesia the patient is very thin and easy to examine. I could not palpate any definitive bladder tumors. There was some softness between the fingers on bimanual exam but this may have been the bowels or the bladder. There were certainly no indurated or hard masses. Everything was mobile.   On cystoscopy ureteroscopy there were multiple bladder tumors as expected from the CT. Initially I was able to identify the left ureteral orifice only. The patient had a bit of the cystocele so the UOs were fairly far apart. After resecting the bladder neck tumor I was able to identify the right ureteral orifice. Neither orifice was involved with tumor. A large bladder tumor at the right bladder neck and a large posterior bladder tumor were the most prominent. There are multiple smaller papillary tumors as well as tumor crawling out along the mucosa adjacent to the bladder neck and posterior tumor.   Overall clinically the be a least T1 possible T2 although I could see clear muscle. As expected posteriorly and superiorly where the large tumor was bladder wall is very thin. I did 1 cold cup. The deep portion of the peripheral specimen which was representative of the behavior of the other sites.   Description of procedure: After consent was obtained patient brought to the operating room. After adequate anesthesia he is placed in lithotomy position and prepped and draped in the usual sterile fashion. A timeout was performed to confirm the patient and procedure. An exam under anesthesia was performed. The cystoscope was advanced per urethra and the bladder carefully examined with  a 12 and 70 lens. Next resectoscope was placed with the loop. I began by resecting the bladder neck tumor which also incorporated 2 smaller tumors along the midline trigone. This was a large tumor 5 cm by itself. I then resected a total of 6 more tumors. These were small. Finally the large posterior tumor. I began by clearing off a large section on the left side of the tumor, a string of abnormal mucosa extending from the tumor toward the dome, and a large portion of tumor along the left a large portion of the larger central posterior tumor. After each area of resection I was sure to drain the bladder and check each location and nature there was excellent hemostasis under low pressure.   This was about an hour and a half of resection. Therefore I went ahead and fulgurated some of the large tumor. Also took a cold cup and just took a specimen of the base of the periphery of the large posterior tumor. This contained muscle. The bladder wall extends about was having to be very careful with resection. Overall is pleased with the progress made and there was minimal blood loss which I was pleased with. There was equal return of fluid throughout the case. At this time the procedure was terminated with good hemostasis.   She was awakened taken to the recovery room in stable condition.   Complications: None   Blood loss: Minimal   Specimens: #1 right bladder tumor-this included the bladder neck and some smaller tumors along the trigone.  #2 bladder tumors this was specimen from several other bladder tumors  #3 posterior  tumor this was from the portion of resection of the posterior tumor  #4 posterior base was a cold cup of the base of this tumor to help gauge level of invasion

## 2013-12-13 NOTE — Anesthesia Postprocedure Evaluation (Signed)
  Anesthesia Post-op Note  Patient: [name]  Procedure(s) Performed: Procedure(s) (LRB): TRANSURETHRAL RESECTION OF BLADDER TUMOR (TURBT) (N/A)  Patient Location: PACU  Anesthesia Type: Spinal  Level of Consciousness: awake and alert   Airway and Oxygen Therapy: Patient Spontanous Breathing  Post-op Pain: mild  Post-op Assessment: Post-op Vital signs reviewed, Patient's Cardiovascular Status Stable, Respiratory Function Stable, Patent Airway and No signs of Nausea or vomiting  Last Vitals:  Filed Vitals:   12/13/13 1807  BP: 122/59  Pulse: 70  Temp: 36.7 C  Resp: 20    Post-op Vital Signs: stable   Complications: No apparent anesthesia complications

## 2013-12-13 NOTE — Anesthesia Preprocedure Evaluation (Addendum)
Anesthesia Evaluation  Patient identified by MRN, date of birth, ID band Patient awake    Reviewed: Allergy & Precautions, H&P , NPO status , Patient's Chart, lab work & pertinent test results, reviewed documented beta blocker date and time   History of Anesthesia Complications Negative for: history of anesthetic complications  Airway Mallampati: I TM Distance: >3 FB Neck ROM: Full    Dental  (+) Edentulous Upper, Edentulous Lower   Pulmonary neg sleep apnea, neg COPDCurrent Smoker,  1-1.5 ppd cigs breath sounds clear to auscultation        Cardiovascular - angina+CHF - CAD + dysrhythmias Rhythm:Regular  Ef 20% Nonischemic cardiomyopathy   Neuro/Psych negative neurological ROS  negative psych ROS   GI/Hepatic negative GI ROS, Neg liver ROS, Epigastric pain   Endo/Other  negative endocrine ROS  Renal/GU negative Renal ROS  negative genitourinary   Musculoskeletal negative musculoskeletal ROS (+)   Abdominal   Peds negative pediatric ROS (+)  Hematology  (+) anemia ,   Anesthesia Other Findings   Reproductive/Obstetrics negative OB ROS                          Anesthesia Physical  Anesthesia Plan  ASA: III  Anesthesia Plan:    Post-op Pain Management:    Induction:   Airway Management Planned:   Additional Equipment:   Intra-op Plan:   Post-operative Plan:   Informed Consent: I have reviewed the patients History and Physical, chart, labs and discussed the procedure including the risks, benefits and alternatives for the proposed anesthesia with the patient or authorized representative who has indicated his/her understanding and acceptance.   Dental advisory given  Plan Discussed with: CRNA, Anesthesiologist and Surgeon  Anesthesia Plan Comments:        Anesthesia Quick Evaluation

## 2013-12-13 NOTE — H&P (Signed)
H&P  Chief Complaint: Presents for TURBT with large bladder neoplasms  History of Present Illness: 73 yo female presented with gross hematuria a few weeks ago. CT shows multiple bladder neoplasm - about 7 - some quite large. She is a smoker and has a h/o of bladder cancer but no real surveillance since about 1995.   Today she has no complaints. No gross hematuria or dysuria.     Past Medical History  Diagnosis Date  . Ectopic atrial beats   . Bladder cancer 1995  . GERD (gastroesophageal reflux disease)     h/o ulcers  . LBBB (left bundle branch block)    Past Surgical History  Procedure Laterality Date  . Esophagogastroduodenoscopy N/A 11/28/2013    Procedure: ESOPHAGOGASTRODUODENOSCOPY (EGD);  Surgeon: Arta Silence, MD;  Location: [facility] ENDOSCOPY;  Service: Endoscopy;  Laterality: N/A;  . Back surgery      Home Medications:  Prescriptions prior to admission  Medication Sig Dispense Refill  . acetaminophen (TYLENOL) 500 MG tablet Take 500 mg by mouth every 8 (eight) hours as needed for moderate pain.      . ferrous sulfate 325 (65 FE) MG tablet Take 325 mg by mouth 2 (two) times daily with a meal.      . lidocaine (LIDODERM) 5 % Place 1 patch onto the skin daily. Remove & Discard patch within 12 hours or as directed by MD      . lisinopril (PRINIVIL,ZESTRIL) 5 MG tablet Take 5 mg by mouth daily.      . metoCLOPramide (REGLAN) 5 MG tablet Take 5 mg by mouth 3 (three) times daily before meals.      . nicotine (NICODERM CQ - DOSED IN MG/24 HOURS) 14 mg/24hr patch Place 14 mg onto the skin daily.      . pantoprazole (PROTONIX) 40 MG tablet Take 40 mg by mouth 2 (two) times daily.      . sucralfate (CARAFATE) 1 G tablet Take 1 g by mouth 4 (four) times daily.      . traMADol (ULTRAM) 50 MG tablet Take 50 mg by mouth every 6 (six) hours as needed for moderate pain.       Allergies: No Known Allergies  History reviewed. No pertinent family history. Social History:  reports that she  has been smoking.  She has never used smokeless tobacco. She reports that she does not drink alcohol or use illicit drugs.  ROS: A complete review of systems was performed.  All systems are negative except for pertinent findings as noted. ROS   Physical Exam:  Vital signs in last 24 hours: Temp:  [97.9 F (36.6 C)] 97.9 F (36.6 C) (09/25 0919) Pulse Rate:  [72] 72 (09/25 0919) Resp:  [16] 16 (09/25 0919) BP: (149)/(84) 149/84 mmHg (09/25 0919) SpO2:  [100 %] 100 % (09/25 0919) Weight:  [53.695 kg (118 lb 6 oz)] 53.695 kg (118 lb 6 oz) (09/25 0919) General:  Alert and oriented, No acute distress HEENT: Normocephalic, atraumatic Neck: No JVD or lymphadenopathy Cardiovascular: Regular rate and rhythm Lungs: Regular rate and effort Abdomen: Soft, nontender, nondistended, no abdominal masses Back: No CVA tenderness Extremities: No edema Neurologic: Grossly intact  Laboratory Data:  Results for orders placed during the hospital encounter of 12/13/13 (from the past 24 hour(s))  CBC     Status: Abnormal   Collection Time    12/13/13  8:25 AM      Result Value Ref Range   WBC 5.7  4.0 - 10.5  K/uL   RBC 4.31  3.87 - 5.11 MIL/uL   Hemoglobin 9.9 (*) 12.0 - 15.0 g/dL   HCT 31.5 (*) 36.0 - 46.0 %   MCV 73.1 (*) 78.0 - 100.0 fL   MCH 23.0 (*) 26.0 - 34.0 pg   MCHC 31.4  30.0 - 36.0 g/dL   RDW 29.0 (*) 11.5 - 15.5 %   Platelets 317  150 - 400 K/uL   No results found for this or any previous visit (from the past 240 hour(s)). Creatinine: No results found for this basename: CREATININE,  in the last 168 hours  Impression/Assessment/plan: I discussed with the patient the nature, potential benefits, risks and alternatives to TURBT,  including side effects of the proposed treatment, the likelihood of the patient achieving the goals of the procedure, and any potential problems that might occur during the procedure or recuperation. She will need a staged procedure possible need to consider  cystectomy. All questions answered. Patient elects to proceed.     [name] 12/13/2013, 2:08 PM

## 2013-12-13 NOTE — Anesthesia Procedure Notes (Signed)
Spinal  Patient location during procedure: OR Staffing Anesthesiologist: Steen Bisig Performed by: anesthesiologist  Preanesthetic Checklist Completed: patient identified, site marked, surgical consent, pre-op evaluation, timeout performed, IV checked, risks and benefits discussed and monitors and equipment checked Spinal Block Patient position: sitting Prep: Betadine Patient monitoring: heart rate, continuous pulse ox and blood pressure Approach: right paramedian Location: L3-4 Injection technique: single-shot Needle Needle type: Spinocan  Needle gauge: 22 G Needle length: 9 cm Additional Notes Expiration date of kit checked and confirmed. Patient tolerated procedure well, without complications.     

## 2013-12-14 DIAGNOSIS — C675 Malignant neoplasm of bladder neck: Secondary | ICD-10-CM | POA: Diagnosis not present

## 2013-12-14 NOTE — Progress Notes (Signed)
No problem voiding after foley removed, voided x 2, d/c home with daughter

## 2013-12-14 NOTE — Discharge Summary (Signed)
Physician Discharge Summary  Patient ID: [name] MRN: [id number] DOB/AGE: 07-21-1940 73 y.o.  Admit date: 12/13/2013 Discharge date: 12/14/2013  Admission Diagnoses: Large Volume Bladder Cancer  Discharge Diagnoses:  Active Problems:   Bladder neoplasm   Discharged Condition: good  Hospital Course:   1 - Large Volume Bladder Cancer - Pt underwent first stage transurethral resection of bladder tumor on 9/25, the day of admission, without acute complications. Was observed overnight post-op. By POD 1, the day of discharge, pt afebrile, pain controlled, no issues with clot retention, had catheter removed and felt to be adequate for discharge. She will need second stage resection in elective setting. Dr. Junious Silk will call her with path results.   Consults: None  Significant Diagnostic Studies: labs: surgical pathology - pending  Treatments: surgery: first stage transurethral resection of bladder tumor on 9/25  Discharge Exam: Blood pressure 115/61, pulse 60, temperature 97.9 F (36.6 C), temperature source Oral, resp. rate 18, height 5' 8.75" (1.746 m), weight 53.695 kg (118 lb 6 oz), SpO2 100.00%. General appearance: alert, cooperative, appears stated age and family at bedside Head: Normocephalic, without obvious abnormality, atraumatic Nose: Nares normal. Septum midline. Mucosa normal. No drainage or sinus tenderness. Throat: lips, mucosa, and tongue normal; teeth and gums normal Neck: supple, symmetrical, trachea midline Back: symmetric, no curvature. ROM normal. No CVA tenderness. Resp: non-labored on room air Cardio: Nl rate GI: soft, non-tender; bowel sounds normal; no masses,  no organomegaly Pelvic: external genitalia normal and foley c/d/i with blush colored urien w/o clots Extremities: extremities normal, atraumatic, no cyanosis or edema Pulses: 2+ and symmetric Skin: Skin color, texture, turgor normal. No rashes or lesions Lymph nodes: Cervical, supraclavicular,  and axillary nodes normal. Neurologic: Grossly normal  Disposition: 01-Home or Self Care     Medication List         acetaminophen 500 MG tablet  Commonly known as:  TYLENOL  Take 500 mg by mouth every 8 (eight) hours as needed for moderate pain.     ferrous sulfate 325 (65 FE) MG tablet  Take 325 mg by mouth 2 (two) times daily with a meal.     lidocaine 5 %  Commonly known as:  LIDODERM  Place 1 patch onto the skin daily. Remove & Discard patch within 12 hours or as directed by MD     lisinopril 5 MG tablet  Commonly known as:  PRINIVIL,ZESTRIL  Take 5 mg by mouth daily.     metoCLOPramide 5 MG tablet  Commonly known as:  REGLAN  Take 5 mg by mouth 3 (three) times daily before meals.     nicotine 14 mg/24hr patch  Commonly known as:  NICODERM CQ - dosed in mg/24 hours  Place 14 mg onto the skin daily.     pantoprazole 40 MG tablet  Commonly known as:  PROTONIX  Take 40 mg by mouth 2 (two) times daily.     sucralfate 1 G tablet  Commonly known as:  CARAFATE  Take 1 g by mouth 4 (four) times daily.     traMADol 50 MG tablet  Commonly known as:  ULTRAM  Take 50 mg by mouth every 6 (six) hours as needed for moderate pain.           Follow-up Information   Follow up with Bianca Aloe, MD In 1 week.   Specialty:  Urology   Contact information:   [address] [place] [address] [phone]  SignedAlexis Frock 12/14/2013, 8:59 AM

## 2013-12-16 ENCOUNTER — Encounter (HOSPITAL_COMMUNITY): Payer: Self-pay | Admitting: Urology

## 2013-12-17 ENCOUNTER — Encounter: Payer: Self-pay | Admitting: Cardiovascular Disease

## 2013-12-17 ENCOUNTER — Ambulatory Visit (INDEPENDENT_AMBULATORY_CARE_PROVIDER_SITE_OTHER): Payer: Medicare Other | Admitting: Cardiovascular Disease

## 2013-12-17 VITALS — BP 111/75 | HR 76 | Ht 68.0 in | Wt 118.5 lb

## 2013-12-17 DIAGNOSIS — I499 Cardiac arrhythmia, unspecified: Secondary | ICD-10-CM

## 2013-12-17 DIAGNOSIS — I5022 Chronic systolic (congestive) heart failure: Secondary | ICD-10-CM | POA: Diagnosis not present

## 2013-12-17 DIAGNOSIS — I491 Atrial premature depolarization: Secondary | ICD-10-CM

## 2013-12-17 MED ORDER — METOPROLOL SUCCINATE ER 25 MG PO TB24: 25.0000 mg | ORAL_TABLET | Freq: Every day | ORAL | Status: DC

## 2013-12-17 NOTE — Patient Instructions (Signed)
Start Metoprolol succinate (Toprol) 25 mg once daily.   Continue other medications.   Follow up in 1 month.

## 2013-12-17 NOTE — Progress Notes (Signed)
HPI  [name] is a 73 y.o. female with a remote history of bladder cancer (1995) is here today for a cardiology followup visit. She was hospitalized early this month with nausea, vomiting, substernal chest pain and epigastric pain. She was found to have hyponatremia with severe anemia. She was noted to have frequent PVCs and PACs. Echocardiogram showed an ejection fraction of 20% with global hypokinesis, mild mitral regurgitation, mild pulmonary hypertension and small pericardial effusion. She underwent pharmacologic nuclear stress test which showed fixed anterior and inferior defects with no reversibility and ejection fraction of 20%. She was suspected of having nonischemic cardiomyopathy. Small dose of lisinopril was started. She was diagnosed with bladder tumors. She returned on September 25 to have an elective transurethral resection of bladder tumor. She will be undergoing more resection. It appears from pathology that it was consistent with high grade urothelial carcinoma. She is staying with her daughter in [place]. She denies any chest pain. She complains of shortness of breath without orthopnea, PND or lower extremity edema. She quit smoking early this month.  No Known Allergies   Current Outpatient Prescriptions on File Prior to Visit  Medication Sig Dispense Refill  . lidocaine (LIDODERM) 5 % Place 1 patch onto the skin daily. Remove & Discard patch within 12 hours or as directed by MD      . lisinopril (PRINIVIL,ZESTRIL) 5 MG tablet Take 5 mg by mouth daily.      . metoCLOPramide (REGLAN) 5 MG tablet Take 5 mg by mouth 3 (three) times daily before meals.      . nicotine (NICODERM CQ - DOSED IN MG/24 HOURS) 14 mg/24hr patch Place 14 mg onto the skin daily.      . pantoprazole (PROTONIX) 40 MG tablet Take 40 mg by mouth 2 (two) times daily.      . sucralfate (CARAFATE) 1 G tablet Take 1 g by mouth 4 (four) times daily.      . traMADol (ULTRAM) 50 MG tablet Take 50 mg by mouth  every 6 (six) hours.        No current facility-administered medications on file prior to visit.     Past Medical History  Diagnosis Date  . Ectopic atrial beats   . Bladder cancer 1995  . GERD (gastroesophageal reflux disease)     h/o ulcers  . LBBB (left bundle branch block)   . COPD (chronic obstructive pulmonary disease)      Past Surgical History  Procedure Laterality Date  . Esophagogastroduodenoscopy N/A 11/28/2013    Procedure: ESOPHAGOGASTRODUODENOSCOPY (EGD);  Surgeon: Arta Silence, MD;  Location: [facility] ENDOSCOPY;  Service: Endoscopy;  Laterality: N/A;  . Back surgery    . Transurethral resection of bladder tumor N/A 12/13/2013    Procedure: TRANSURETHRAL RESECTION OF BLADDER TUMOR (TURBT);  Surgeon: [name], MD;  Location: [facility] ORS;  Service: Urology;  Laterality: N/A;     Family History  Problem Relation Age of Onset  . Family history unknown: Yes     History   Social History  . Marital Status: Widowed    Spouse Name: N/A    Number of Children: N/A  . Years of Education: N/A   Occupational History  . Not on file.   Social History Main Topics  . Smoking status: Former Smoker -- 1.50 packs/day for 50 years    Types: Cigarettes  . Smokeless tobacco: Never Used  . Alcohol Use: No  . Drug Use: No  . Sexual Activity: Not on  file   Other Topics Concern  . Not on file   Social History Narrative  . No narrative on file     ROS A 10 point review of system was performed. It is negative other than that mentioned in the history of present illness.   PHYSICAL EXAM   BP 111/75  Pulse 76  Ht 5\' 8"  (1.727 m)  Wt 118 lb 8 oz (53.751 kg)  BMI 18.02 kg/m2 Constitutional: She is oriented to person, place, and time. She appears frail. No distress.  HENT: No nasal discharge.  Head: Normocephalic and atraumatic.  Eyes: Pupils are equal and round. No discharge.  Neck: Normal range of motion. Neck supple. No JVD present. No thyromegaly present.    Cardiovascular: Normal rate, regular rhythm, normal heart sounds. Exam reveals no gallop and no friction rub. No murmur heard.  Pulmonary/Chest: Effort normal and breath sounds normal. No stridor. No respiratory distress. She has no wheezes. She has no rales. She exhibits no tenderness.  Abdominal: Soft. Bowel sounds are normal. She exhibits no distension. There is no tenderness. There is no rebound and no guarding.  Musculoskeletal: Normal range of motion. She exhibits no edema and no tenderness.  Neurological: She is alert and oriented to person, place, and time. Coordination normal.  Skin: Skin is warm and dry. No rash noted. She is not diaphoretic. No erythema. No pallor.  Psychiatric: She has a normal mood and affect. Her behavior is normal. Judgment and thought content normal.     KYH:[id number]  Rhythm  - occasional ectopic ventricular beat    -Left bundle branch block and left axis.   ABNORMAL     ASSESSMENT AND PLAN

## 2013-12-17 NOTE — Assessment & Plan Note (Addendum)
She was recently diagnosed with cardiomyopathy in the setting of acute illness. Most likely nonischemic. However, given her age and prolonged tobacco use, underlying obstructive coronary artery disease cannot be excluded. Recent nuclear stress testing showed fixed defect without reversibility. Ideally, we should pursue cardiac catheterization. However, given her recent diagnosis of what seems to be aggressive cancer, I recommend medical therapy for now. Continue small dose lisinopril. I added small dose metoprolol succinate. We have to see if she can tolerate this given the relatively low blood pressure. She is currently New [place] Heart Association class III with no evidence of fluid overload.

## 2013-12-17 NOTE — Assessment & Plan Note (Signed)
Hopefully this will improve with a beta blocker.

## 2013-12-18 DIAGNOSIS — C672 Malignant neoplasm of lateral wall of bladder: Secondary | ICD-10-CM | POA: Diagnosis not present

## 2013-12-18 DIAGNOSIS — R310 Gross hematuria: Secondary | ICD-10-CM | POA: Diagnosis not present

## 2013-12-18 DIAGNOSIS — C675 Malignant neoplasm of bladder neck: Secondary | ICD-10-CM | POA: Diagnosis not present

## 2013-12-18 DIAGNOSIS — C674 Malignant neoplasm of posterior wall of bladder: Secondary | ICD-10-CM | POA: Diagnosis not present

## 2013-12-20 ENCOUNTER — Other Ambulatory Visit: Payer: Self-pay | Admitting: Urology

## 2013-12-20 DIAGNOSIS — J449 Chronic obstructive pulmonary disease, unspecified: Secondary | ICD-10-CM | POA: Diagnosis not present

## 2013-12-20 DIAGNOSIS — K219 Gastro-esophageal reflux disease without esophagitis: Secondary | ICD-10-CM | POA: Diagnosis not present

## 2013-12-20 DIAGNOSIS — R269 Unspecified abnormalities of gait and mobility: Secondary | ICD-10-CM | POA: Diagnosis not present

## 2013-12-20 DIAGNOSIS — I420 Dilated cardiomyopathy: Secondary | ICD-10-CM | POA: Diagnosis not present

## 2013-12-20 DIAGNOSIS — H2513 Age-related nuclear cataract, bilateral: Secondary | ICD-10-CM | POA: Diagnosis not present

## 2013-12-20 DIAGNOSIS — D494 Neoplasm of unspecified behavior of bladder: Secondary | ICD-10-CM | POA: Diagnosis not present

## 2013-12-20 DIAGNOSIS — K259 Gastric ulcer, unspecified as acute or chronic, without hemorrhage or perforation: Secondary | ICD-10-CM | POA: Diagnosis not present

## 2013-12-23 DIAGNOSIS — K259 Gastric ulcer, unspecified as acute or chronic, without hemorrhage or perforation: Secondary | ICD-10-CM | POA: Diagnosis not present

## 2013-12-23 DIAGNOSIS — J449 Chronic obstructive pulmonary disease, unspecified: Secondary | ICD-10-CM | POA: Diagnosis not present

## 2013-12-23 DIAGNOSIS — D494 Neoplasm of unspecified behavior of bladder: Secondary | ICD-10-CM | POA: Diagnosis not present

## 2013-12-23 DIAGNOSIS — I420 Dilated cardiomyopathy: Secondary | ICD-10-CM | POA: Diagnosis not present

## 2013-12-23 DIAGNOSIS — K219 Gastro-esophageal reflux disease without esophagitis: Secondary | ICD-10-CM | POA: Diagnosis not present

## 2013-12-23 DIAGNOSIS — R269 Unspecified abnormalities of gait and mobility: Secondary | ICD-10-CM | POA: Diagnosis not present

## 2013-12-25 DIAGNOSIS — K259 Gastric ulcer, unspecified as acute or chronic, without hemorrhage or perforation: Secondary | ICD-10-CM | POA: Diagnosis not present

## 2013-12-25 DIAGNOSIS — D494 Neoplasm of unspecified behavior of bladder: Secondary | ICD-10-CM | POA: Diagnosis not present

## 2013-12-25 DIAGNOSIS — J449 Chronic obstructive pulmonary disease, unspecified: Secondary | ICD-10-CM | POA: Diagnosis not present

## 2013-12-25 DIAGNOSIS — K219 Gastro-esophageal reflux disease without esophagitis: Secondary | ICD-10-CM | POA: Diagnosis not present

## 2013-12-25 DIAGNOSIS — I420 Dilated cardiomyopathy: Secondary | ICD-10-CM | POA: Diagnosis not present

## 2013-12-25 DIAGNOSIS — R269 Unspecified abnormalities of gait and mobility: Secondary | ICD-10-CM | POA: Diagnosis not present

## 2013-12-26 ENCOUNTER — Ambulatory Visit: Payer: Medicare Other | Admitting: Cardiology

## 2013-12-27 ENCOUNTER — Ambulatory Visit (INDEPENDENT_AMBULATORY_CARE_PROVIDER_SITE_OTHER)
Admission: RE | Admit: 2013-12-27 | Discharge: 2013-12-27 | Disposition: A | Discharge status: Home / Self Care | Payer: Medicare Other | Source: Ambulatory Visit | Attending: Internal Medicine | Admitting: Internal Medicine

## 2013-12-27 ENCOUNTER — Ambulatory Visit (INDEPENDENT_AMBULATORY_CARE_PROVIDER_SITE_OTHER): Payer: Medicare Other | Admitting: Internal Medicine

## 2013-12-27 ENCOUNTER — Encounter: Payer: Self-pay | Admitting: Internal Medicine

## 2013-12-27 VITALS — BP 122/72 | HR 59 | Temp 97.9°F | Resp 14 | Ht 68.25 in | Wt 117.5 lb

## 2013-12-27 DIAGNOSIS — K5900 Constipation, unspecified: Secondary | ICD-10-CM | POA: Diagnosis not present

## 2013-12-27 DIAGNOSIS — G629 Polyneuropathy, unspecified: Secondary | ICD-10-CM

## 2013-12-27 DIAGNOSIS — M5030 Other cervical disc degeneration, unspecified cervical region: Secondary | ICD-10-CM | POA: Diagnosis not present

## 2013-12-27 DIAGNOSIS — G609 Hereditary and idiopathic neuropathy, unspecified: Secondary | ICD-10-CM | POA: Diagnosis not present

## 2013-12-27 DIAGNOSIS — M542 Cervicalgia: Secondary | ICD-10-CM | POA: Diagnosis not present

## 2013-12-27 DIAGNOSIS — G589 Mononeuropathy, unspecified: Secondary | ICD-10-CM | POA: Diagnosis not present

## 2013-12-27 DIAGNOSIS — M4805 Spinal stenosis, thoracolumbar region: Secondary | ICD-10-CM | POA: Diagnosis not present

## 2013-12-27 DIAGNOSIS — J449 Chronic obstructive pulmonary disease, unspecified: Secondary | ICD-10-CM | POA: Insufficient documentation

## 2013-12-27 DIAGNOSIS — Z23 Encounter for immunization: Secondary | ICD-10-CM

## 2013-12-27 LAB — HEMOGLOBIN A1C: Hgb A1c MFr Bld: 4.6 % (ref 4.6–6.5)

## 2013-12-27 LAB — VITAMIN B12: Vitamin B-12: 1069 pg/mL — ABNORMAL HIGH (ref 211–911)

## 2013-12-27 MED ORDER — LIDOCAINE 5 % EX PTCH: 1.0000 | MEDICATED_PATCH | CUTANEOUS | Status: DC

## 2013-12-27 MED ORDER — PEG 3350-KCL-NA BICARB-NACL 420 G PO SOLR: 4000.0000 mL | Freq: Once | ORAL | Status: DC

## 2013-12-27 MED ORDER — HYDROCODONE-ACETAMINOPHEN 5-325 MG PO TABS: 1.0000 | ORAL_TABLET | Freq: Four times a day (QID) | ORAL | Status: DC | PRN

## 2013-12-27 NOTE — Progress Notes (Signed)
Patient ID: [name], female   DOB: [birth year], 73 y.o.   MRN: [id number]  Patient Active Problem List   Diagnosis Date Noted  . Constipation 12/29/2013  . Spinal stenosis of thoracolumbar region 12/27/2013  . COPD (chronic obstructive pulmonary disease) 12/27/2013  . Chronic systolic heart failure 78/29/5621  . Bladder neoplasm 12/13/2013  . Gastric ulcer 11/28/2013  . Preoperative cardiovascular examination 11/27/2013  . Congestive dilated cardiomyopathy 11/25/2013  . Bladder cancer 11/24/2013  . Hematuria 11/24/2013  . LBBB (left bundle branch block) 11/24/2013  . Atrial ectopy 11/24/2013  . Palpitations 11/22/2013  . Hyponatremia 11/22/2013  . Anemia, iron deficiency 11/22/2013  . Chest pain 11/22/2013  . Epigastric pain 11/22/2013    Subjective:  CC:   Chief Complaint  Patient presents with  . Establish Care    HPI:   [name] a 73 y.o. female who presents with back pain.  history of scoliosis, prior back surgery in the 1980's.  Currently her pain has been present for the  past year and worse since her hospitalization last month for bladder cancer. Remained physically very active until last month.  Has been living with her daughter [name] since the hospitalization due to decreasaed mobility and inability to perform IADLs.  She s not using a walker , even though the church donated one.  Using 4 gm tylenol daily and tramadol.  Wants to use lidocaine patches but the cost is prohibitive bc insurance does not over .pain is lumbar  Region Affects both buttocks and occasionally readiates to both thighs  Also notes that  both hands feel numb when she lies down  No prior bone density test.   Constipation..   Stool sare hard,  Despite eating prunes . Has been taking iron since hospitalization.  Weight loss. Using protein drinks. 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 Had tumors removed on Sept 25th  By   Dr Alverda Skeans.  With repeat Oct 30th  planned.   History of chronic Systolic  Dysfuncion.   QUIT SMOKING A MONTH AGO AFTER 50 YEARS.  TOLD SHE HAD EMPHYSEMA 20 YEARS AGO BUT ASYMPTPOMATIC     Past Medical History  Diagnosis Date  . Ectopic atrial beats   . Bladder cancer 1995  . GERD (gastroesophageal reflux disease)     h/o ulcers  . LBBB (left bundle branch block)   . COPD (chronic obstructive pulmonary disease)        @ALL @  Past Surgical History  Procedure Laterality Date  . Esophagogastroduodenoscopy N/A 11/28/2013    Procedure: ESOPHAGOGASTRODUODENOSCOPY (EGD);  Surgeon: Arta Silence, MD;  Location[facility] ENDOSCOPY;  Service: Endoscopy;  Laterality: N/A;  . Back surgery    . Transurethral resection of bladder tumor N/A 12/13/2013    Procedure: TRANSURETHRAL RESECTION OF BLADDER TUMOR (TURBT);  Surgeon: [name], MD;  Location: [facility] ORS;  Service: Urology;  Laterality: N/A;    History   Social History  . Marital Status: Widowed    Spouse Name: N/A    Number of Children: N/A  . Years of Education: N/A   Occupational History  . Not on file.   Social History Main Topics  . Smoking status: Former Smoker -- 1.50 packs/day for 50 years    Types: Cigarettes  . Smokeless tobacco: Never Used  . Alcohol Use: No  . Drug Use: No  . Sexual Activity: Not on file   Other Topics Concern  . Not on file   Social History  Narrative  . No narrative on file   Family History  Problem Relation Age of Onset  . Cancer Mother   . Cancer Brother      Review of Systems:   The rest of the review of systems was negative except those addressed in the HPI.   Objective:  BP 122/72  Pulse 59  Temp(Src) 97.9 F (36.6 C) (Oral)  Resp 14  Ht 5' 8.25" (1.734 m)  Wt 117 lb 8 oz (53.298 kg)  BMI 17.73 kg/m2  SpO2 98%  General appearance: alert, cooperative and appears stated age Ears: normal TM's and external ear canals both ears Throat: lips, mucosa, and tongue normal; teeth and gums normal Neck: no  adenopathy, no carotid bruit, supple, symmetrical, trachea midline and thyroid not enlarged, symmetric, no tenderness/mass/nodules Back: symmetric, no curvature. ROM normal. No CVA tenderness. Lungs: clear to auscultation bilaterally Heart: regular rate and rhythm, S1, S2 normal, no murmur, click, rub or gallop Abdomen: soft, non-tender; bowel sounds normal; no masses,  no organomegaly Pulses: 2+ and symmetric Skin: Skin color, texture, turgor normal. No rashes or lesions Lymph nodes: Cervical, supraclavicular, and axillary nodes normal.  Assessment and Plan:  COPD (chronic obstructive pulmonary disease) QUIT LAST MONTH   Constipation Go lytely prescribed for gentle but effective management without inducing electrolyte disturbances.  Several glassess advised.  Then nightly miralax . Thyroid and lytes reviewed  Lab Results  Component Value Date   NA 136* 11/29/2013   K 4.2 11/29/2013   CL 101 11/29/2013   CO2 24 11/29/2013     Lab Results  Component Value Date   TSH 1.700 11/27/2013      Spinal stenosis of thoracolumbar region Suggested by history of scoliosis,  Prior surgery and plain films in September showing severe DDD.   May also involve cervical spine given abnormal  Plain films today. .  Daughter has initiated appt with Dr. Sherwood Gambler for definitive treatmnt but wants to avoid surgery if possible.   Adding vicodin and bowel regimen  . May need pain clinicn referral.  A total of 45 minutes was spent with patient more than half of which was spent in counseling patient on the above mentioned issues , reviewing and explaining recent labs and imaging studies done, and coordination of care.   Updated Medication List Outpatient Encounter Prescriptions as of 12/27/2013  Medication Sig  . Acetaminophen (TYLENOL EXTRA STRENGTH PO) Take 1,000 mg by mouth every 6 (six) hours as needed.  . Ferrous Sulfate (SLOW FE PO) Take 1 tablet by mouth 2 (two) times daily. 142 mg twice daily  .  lidocaine (LIDODERM) 5 % Place 1 patch onto the skin daily. Remove & Discard patch within 12 hours or as directed by MD  . lisinopril (PRINIVIL,ZESTRIL) 5 MG tablet Take 5 mg by mouth daily.  . metoCLOPramide (REGLAN) 5 MG tablet Take 5 mg by mouth 3 (three) times daily before meals.  . metoprolol succinate (TOPROL XL) 25 MG 24 hr tablet Take 1 tablet (25 mg total) by mouth daily.  . nicotine (NICODERM CQ - DOSED IN MG/24 HOURS) 14 mg/24hr patch Place 14 mg onto the skin daily.  . pantoprazole (PROTONIX) 40 MG tablet Take 40 mg by mouth 2 (two) times daily.  . sucralfate (CARAFATE) 1 G tablet Take 1 g by mouth 4 (four) times daily.  . traMADol (ULTRAM) 50 MG tablet Take 50 mg by mouth every 6 (six) hours.   Marland Kitchen HYDROcodone-acetaminophen (NORCO/VICODIN) 5-325 MG per tablet Take 1  tablet by mouth every 6 (six) hours as needed for moderate pain.  Marland Kitchen lidocaine (LIDODERM) 5 % Place 1 patch onto the skin daily. Remove & Discard patch within 12 hours or as directed by MD  . polyethylene glycol-electrolytes (NULYTELY/GOLYTELY) 420 G solution Take 4,000 mLs by mouth once.  . [DISCONTINUED] FERROUS SULFATE PO Take 100 mg by mouth 2 (two) times daily.     Orders Placed This Encounter  Procedures  . DG Cervical Spine Complete  . Pneumococcal conjugate vaccine 13-valent  . Fecal Occult Blood, Guaiac  . B12  . Folate RBC  . RPR  . Hemoglobin A1c  . HM COLONOSCOPY    No Follow-up on file.

## 2013-12-27 NOTE — Assessment & Plan Note (Signed)
QUIT LAST MONTH

## 2013-12-27 NOTE — Patient Instructions (Addendum)
Constipation:  Drink a few glasses of the Go lytely,  Once every 30 minutes until you feel cleaned out  Then start TAKING miralax  EVERY NIGHT to keeps the bowel moving  Increase  Your water intake gradually with a goal of 3 bottles daily    Your BACK PAIN IS FROM A NEUROPATHY WHICH MAY BE DUE TO SPINAL STENOSIS CAUSED BY YOUR SCOLIOSIS AND DEGENERATIVE DISK DISEASE  I am Prescribing vicodin for the back pain,  alternate with tramadol ,   2000 mg of acetominophen daily is the maximum  So if you start the vicodin ,  Stop the tylenol  If constipation  recurs with vicodin use ,  Replace the miralax with sennakot S   Spinal Stenosis Spinal stenosis is an abnormal narrowing of the canals of your spine (vertebrae). CAUSES  Spinal stenosis is caused by areas of bone pushing into the central canals of your vertebrae. This condition can be present at birth (congenital). It also may be caused by arthritic deterioration of your vertebrae (spinal degeneration).  SYMPTOMS   Pain that is generally worse with activities, particularly standing and walking.  Numbness, tingling, hot or cold sensations, weakness, or weariness in your legs.  Frequent episodes of falling.  A foot-slapping gait that leads to muscle weakness. DIAGNOSIS  Spinal stenosis is diagnosed with the use of magnetic resonance imaging (MRI) or computed tomography (CT). TREATMENT  Initial therapy for spinal stenosis focuses on the management of the pain and other symptoms associated with the condition. These therapies include:  Practicing postural changes to lessen pressure on your nerves.  Exercises to strengthen the core of your body.  Loss of excess body weight.  The use of nonsteroidal anti-inflammatory medicines to reduce swelling and inflammation in your nerves. When therapies to manage pain are not successful, surgery to treat spinal stenosis may be recommended. This surgery involves removing excess bone, which puts  pressure on your nerve roots. During this surgery (laminectomy), the posterior boney arch (lamina) and excess bone around the facet joints are removed. Document Released: 05/28/2003 Document Revised: 07/22/2013 Document Reviewed: 06/15/2012 [facility] Patient Information 2015 [place], [place]. This information is not intended to replace advice given to you by your health care provider. Make sure you discuss any questions you have with your health care provider.

## 2013-12-28 LAB — RPR

## 2013-12-29 ENCOUNTER — Encounter: Payer: Self-pay | Admitting: Internal Medicine

## 2013-12-29 DIAGNOSIS — K5900 Constipation, unspecified: Secondary | ICD-10-CM | POA: Insufficient documentation

## 2013-12-29 LAB — FOLATE RBC: RBC Folate: 926 ng/mL (ref >280)

## 2013-12-29 NOTE — Assessment & Plan Note (Signed)
Go lytely prescribed for gentle but effective management without inducing electrolyte disturbances.  Several glassess advised.  Then nightly miralax . Thyroid and lytes reviewed  Lab Results  Component Value Date   NA 136* 11/29/2013   K 4.2 11/29/2013   CL 101 11/29/2013   CO2 24 11/29/2013     Lab Results  Component Value Date   TSH 1.700 11/27/2013

## 2013-12-29 NOTE — Assessment & Plan Note (Addendum)
Suggested by history of scoliosis,  Prior surgery and plain films in September showing severe DDD.   May also involve cervical spine given abnormal  Plain films today. .  Daughter has initiated appt with Dr. Sherwood Gambler for definitive treatmnt but wants to avoid surgery if possible.   Adding vicodin and bowel regimen  . May need pain clinicn referral.

## 2013-12-30 DIAGNOSIS — K259 Gastric ulcer, unspecified as acute or chronic, without hemorrhage or perforation: Secondary | ICD-10-CM | POA: Diagnosis not present

## 2013-12-30 DIAGNOSIS — Z1211 Encounter for screening for malignant neoplasm of colon: Secondary | ICD-10-CM | POA: Diagnosis not present

## 2013-12-31 ENCOUNTER — Ambulatory Visit (INDEPENDENT_AMBULATORY_CARE_PROVIDER_SITE_OTHER): Payer: Medicare Other | Admitting: Internal Medicine

## 2013-12-31 ENCOUNTER — Encounter: Payer: Self-pay | Admitting: Internal Medicine

## 2013-12-31 ENCOUNTER — Telehealth: Payer: Self-pay | Admitting: *Deleted

## 2013-12-31 VITALS — BP 100/72 | HR 59 | Ht 68.0 in | Wt 119.0 lb

## 2013-12-31 DIAGNOSIS — I420 Dilated cardiomyopathy: Secondary | ICD-10-CM

## 2013-12-31 DIAGNOSIS — R0602 Shortness of breath: Secondary | ICD-10-CM

## 2013-12-31 DIAGNOSIS — I447 Left bundle-branch block, unspecified: Secondary | ICD-10-CM

## 2013-12-31 NOTE — Progress Notes (Signed)
Patient Care Team: Abigail Miyamoto, MD as PCP - General (Family Medicine)   HPI  [name] is a 73 y.o. 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  She also had tracings in the hospital with the operating QRS in lead V1 suggesting alternating bundle branch block; however those times, the limb leads still demonstrated left bundle branch block. She underwent Myoview scanning. This identified fixed defects  Although Dr. Gaylyn Cheers thinks there may be ischemic heart disease. However, given her recent diagnosis noted below, medical therapy has been elected.  She's also been diagnosed as having bladder cancer remotely and now recurrently. it is characterized as being aggressive; she's had one procedure and is scheduled for repeat procedure at the end of October. It is her desire and her family's desire that this issue be clarified and resolved prior to pursuing further cardiac therapies.  She has intercurrently stopped smoking. She still has significant problems however with shortness of breath at less than 100 feet. She does not have peripheral edema.   Past Medical History  Diagnosis Date  . Ectopic atrial beats   . Bladder cancer 1995  . GERD (gastroesophageal reflux disease)     h/o ulcers  . LBBB (left bundle branch block)   . COPD (chronic obstructive pulmonary disease)     Past Surgical History  Procedure Laterality Date  . Esophagogastroduodenoscopy N/A 11/28/2013    Procedure: ESOPHAGOGASTRODUODENOSCOPY (EGD);  Surgeon: Arta Silence, MD;  Location: [facility] ENDOSCOPY;  Service: Endoscopy;  Laterality: N/A;  . Back surgery    . Transurethral resection of bladder tumor N/A 12/13/2013    Procedure: TRANSURETHRAL RESECTION OF BLADDER TUMOR (TURBT);  Surgeon: [name], MD;  Location: [facility] ORS;  Service: Urology;  Laterality: N/A;    Current Outpatient Prescriptions  Medication Sig Dispense Refill  . Ferrous Sulfate (SLOW  FE PO) Take 1 tablet by mouth 2 (two) times daily. 142 mg twice daily      . HYDROcodone-acetaminophen (NORCO/VICODIN) 5-325 MG per tablet Take 1 tablet by mouth every 6 (six) hours as needed for moderate pain.  90 tablet  0  . lidocaine (LIDODERM) 5 % Place 1 patch onto the skin daily. Remove & Discard patch within 12 hours or as directed by MD  30 patch  0  . lisinopril (PRINIVIL,ZESTRIL) 5 MG tablet Take 5 mg by mouth daily.      . metoprolol succinate (TOPROL XL) 25 MG 24 hr tablet Take 1 tablet (25 mg total) by mouth daily.  30 tablet  3  . nicotine (NICODERM CQ - DOSED IN MG/24 HOURS) 14 mg/24hr patch Place 14 mg onto the skin daily.      . pantoprazole (PROTONIX) 40 MG tablet Take 40 mg by mouth 2 (two) times daily.      . polyethylene glycol-electrolytes (NULYTELY/GOLYTELY) 420 G solution Take 4,000 mLs by mouth once.  4000 mL  0  . sucralfate (CARAFATE) 1 G tablet Take 1 g by mouth 4 (four) times daily.       No current facility-administered medications for this visit.    No Known Allergies  Review of Systems negative except from HPI and PMH  Physical Exam BP 100/72  Pulse 59  Ht 5\' 8"  (1.727 m)  Wt 119 lb (53.978 kg)  BMI 18.10 kg/m2 Well developed and well nourished in no acute distress HENT normal E scleral and icterus clear Neck Supple JVP flat; carotids  brisk and full cleaer *Regular rate and rhythm, no murmurs gallops or rub Soft with active bowel sounds No clubbing cyanosis  Edema Alert and oriented, grossly normal motor and sensory function Skin Warm and Dry   ECG demonstrates sinus rhythm at 59 Interval 17/14/45 Left bundle branch block  Assessment and  Plan  Cardiomyopathy question mechanism  Congestive heart failure-chronic systolic  COPD/emphysema  Left bundle branch block  Bladder cancer   The patient's cardiomyopathy who is in the background. Evidence of perfusion defects, catheterization is indicated to exclude coronary artery disease.. I  have reviewed this with the family as well as the possibility of false positives.  Her blood pressure limiting up titration of medications.  She would be a candidate for cardiac resynchronization an ICD implantation based on the findings of her bladder cancer surgery scheduled for the end of this month. We will review again the family at the end of November.  She is euvolemic we will continue current medications.

## 2013-12-31 NOTE — Patient Instructions (Signed)
Your physician recommends that you schedule a follow-up appointment in:  With Dr. Caryl Comes in the end of November or early December

## 2013-12-31 NOTE — Telephone Encounter (Signed)
Fax from pharmacy, needing PA on Lidocaine patches. Started online, placed in [employer] for signature.

## 2013-12-31 NOTE — Telephone Encounter (Signed)
Placed in [employer].

## 2014-01-01 ENCOUNTER — Other Ambulatory Visit: Payer: Self-pay | Admitting: Internal Medicine

## 2014-01-01 DIAGNOSIS — D494 Neoplasm of unspecified behavior of bladder: Secondary | ICD-10-CM | POA: Diagnosis not present

## 2014-01-01 DIAGNOSIS — K259 Gastric ulcer, unspecified as acute or chronic, without hemorrhage or perforation: Secondary | ICD-10-CM | POA: Diagnosis not present

## 2014-01-01 DIAGNOSIS — R269 Unspecified abnormalities of gait and mobility: Secondary | ICD-10-CM | POA: Diagnosis not present

## 2014-01-01 DIAGNOSIS — I420 Dilated cardiomyopathy: Secondary | ICD-10-CM | POA: Diagnosis not present

## 2014-01-01 DIAGNOSIS — J449 Chronic obstructive pulmonary disease, unspecified: Secondary | ICD-10-CM | POA: Diagnosis not present

## 2014-01-01 DIAGNOSIS — K219 Gastro-esophageal reflux disease without esophagitis: Secondary | ICD-10-CM | POA: Diagnosis not present

## 2014-01-01 NOTE — Telephone Encounter (Signed)
Signed and returned in red folder 

## 2014-01-01 NOTE — Telephone Encounter (Signed)
Please advise refill? 

## 2014-01-01 NOTE — Telephone Encounter (Signed)
PA faxed as requested

## 2014-01-02 ENCOUNTER — Other Ambulatory Visit: Payer: Self-pay | Admitting: *Deleted

## 2014-01-02 ENCOUNTER — Encounter: Payer: Medicare Other | Admitting: Internal Medicine

## 2014-01-02 ENCOUNTER — Ambulatory Visit: Payer: Self-pay | Admitting: Ophthalmology

## 2014-01-02 DIAGNOSIS — Z01812 Encounter for preprocedural laboratory examination: Secondary | ICD-10-CM | POA: Diagnosis not present

## 2014-01-02 DIAGNOSIS — H2513 Age-related nuclear cataract, bilateral: Secondary | ICD-10-CM | POA: Diagnosis not present

## 2014-01-02 DIAGNOSIS — I10 Essential (primary) hypertension: Secondary | ICD-10-CM | POA: Diagnosis not present

## 2014-01-02 DIAGNOSIS — H269 Unspecified cataract: Secondary | ICD-10-CM | POA: Diagnosis not present

## 2014-01-02 LAB — HEMOGLOBIN: HGB: 10.7 g/dL — AB (ref 12.0–16.0)

## 2014-01-02 MED ORDER — NICOTINE 14 MG/24HR TD PT24: MEDICATED_PATCH | TRANSDERMAL | Status: DC

## 2014-01-02 NOTE — Telephone Encounter (Signed)
WE ARE NO LONGER SEEKING TO USE LIDOCAINE ,  THEY DON'T WORK

## 2014-01-02 NOTE — Telephone Encounter (Signed)
IN red folder 

## 2014-01-02 NOTE — Telephone Encounter (Signed)
Why is pharmacy asking for duplicates?  She needs a 30 days supply with 11 refills

## 2014-01-02 NOTE — Telephone Encounter (Signed)
Another PA form completed, placed in [employer] for signature and fax

## 2014-01-02 NOTE — Telephone Encounter (Signed)
rx corrected and sent

## 2014-01-06 ENCOUNTER — Telehealth: Payer: Self-pay | Admitting: Internal Medicine

## 2014-01-06 DIAGNOSIS — M542 Cervicalgia: Secondary | ICD-10-CM

## 2014-01-06 NOTE — Telephone Encounter (Signed)
Patient daughter called and ask if patient should have an MRI before seeing Dr. Sanda Linger for back issues please advise?

## 2014-01-06 NOTE — Telephone Encounter (Signed)
If here pain is persistent, yes,  I have ordered the cervical spine MRI

## 2014-01-07 ENCOUNTER — Telehealth: Payer: Self-pay | Admitting: Family Medicine

## 2014-01-07 NOTE — Telephone Encounter (Signed)
Patient daughter notified as requested.

## 2014-01-07 NOTE — Telephone Encounter (Signed)
Patient other daughter [name] called and stated patient needs refill on Vicodin, verified with DPRMervin Kung) the need for refill  Please advise ok to fill last fill 12/01/13

## 2014-01-07 NOTE — Telephone Encounter (Signed)
Patient was given an rx for 90 vicodin on October 9th, please figure out what is going on

## 2014-01-08 DIAGNOSIS — K259 Gastric ulcer, unspecified as acute or chronic, without hemorrhage or perforation: Secondary | ICD-10-CM | POA: Diagnosis not present

## 2014-01-08 DIAGNOSIS — R269 Unspecified abnormalities of gait and mobility: Secondary | ICD-10-CM | POA: Diagnosis not present

## 2014-01-08 DIAGNOSIS — D494 Neoplasm of unspecified behavior of bladder: Secondary | ICD-10-CM | POA: Diagnosis not present

## 2014-01-08 DIAGNOSIS — I420 Dilated cardiomyopathy: Secondary | ICD-10-CM | POA: Diagnosis not present

## 2014-01-08 DIAGNOSIS — K219 Gastro-esophageal reflux disease without esophagitis: Secondary | ICD-10-CM | POA: Diagnosis not present

## 2014-01-08 DIAGNOSIS — J449 Chronic obstructive pulmonary disease, unspecified: Secondary | ICD-10-CM | POA: Diagnosis not present

## 2014-01-08 NOTE — Telephone Encounter (Signed)
Patient has 42 pills left does not require refill at this time.

## 2014-01-09 ENCOUNTER — Ambulatory Visit: Payer: Self-pay | Admitting: Ophthalmology

## 2014-01-09 DIAGNOSIS — D649 Anemia, unspecified: Secondary | ICD-10-CM | POA: Diagnosis not present

## 2014-01-09 DIAGNOSIS — K219 Gastro-esophageal reflux disease without esophagitis: Secondary | ICD-10-CM | POA: Diagnosis not present

## 2014-01-09 DIAGNOSIS — H2513 Age-related nuclear cataract, bilateral: Secondary | ICD-10-CM | POA: Diagnosis not present

## 2014-01-09 DIAGNOSIS — H2511 Age-related nuclear cataract, right eye: Secondary | ICD-10-CM | POA: Diagnosis not present

## 2014-01-09 DIAGNOSIS — I252 Old myocardial infarction: Secondary | ICD-10-CM | POA: Diagnosis not present

## 2014-01-09 DIAGNOSIS — I2510 Atherosclerotic heart disease of native coronary artery without angina pectoris: Secondary | ICD-10-CM | POA: Diagnosis not present

## 2014-01-09 DIAGNOSIS — Z8711 Personal history of peptic ulcer disease: Secondary | ICD-10-CM | POA: Diagnosis not present

## 2014-01-09 DIAGNOSIS — H269 Unspecified cataract: Secondary | ICD-10-CM | POA: Diagnosis not present

## 2014-01-09 DIAGNOSIS — Z8551 Personal history of malignant neoplasm of bladder: Secondary | ICD-10-CM | POA: Diagnosis not present

## 2014-01-09 DIAGNOSIS — J449 Chronic obstructive pulmonary disease, unspecified: Secondary | ICD-10-CM | POA: Diagnosis not present

## 2014-01-09 DIAGNOSIS — M1990 Unspecified osteoarthritis, unspecified site: Secondary | ICD-10-CM | POA: Diagnosis not present

## 2014-01-09 DIAGNOSIS — C679 Malignant neoplasm of bladder, unspecified: Secondary | ICD-10-CM | POA: Diagnosis not present

## 2014-01-09 DIAGNOSIS — I10 Essential (primary) hypertension: Secondary | ICD-10-CM | POA: Diagnosis not present

## 2014-01-13 ENCOUNTER — Encounter (HOSPITAL_COMMUNITY): Payer: Self-pay | Admitting: *Deleted

## 2014-01-14 ENCOUNTER — Encounter (HOSPITAL_COMMUNITY): Payer: Self-pay | Admitting: Pharmacy Technician

## 2014-01-16 ENCOUNTER — Ambulatory Visit: Payer: Medicare Other | Admitting: Cardiovascular Disease

## 2014-01-16 ENCOUNTER — Ambulatory Visit: Payer: Self-pay | Admitting: Internal Medicine

## 2014-01-16 DIAGNOSIS — M47812 Spondylosis without myelopathy or radiculopathy, cervical region: Secondary | ICD-10-CM | POA: Diagnosis not present

## 2014-01-16 DIAGNOSIS — M47892 Other spondylosis, cervical region: Secondary | ICD-10-CM | POA: Diagnosis not present

## 2014-01-17 ENCOUNTER — Encounter (HOSPITAL_COMMUNITY)
Admission: RE | Disposition: A | Discharge status: Home / Self Care | Payer: Self-pay | Source: Ambulatory Visit | Attending: Urology

## 2014-01-17 ENCOUNTER — Encounter (HOSPITAL_COMMUNITY): Payer: Medicare Other | Admitting: Anesthesiology

## 2014-01-17 ENCOUNTER — Encounter (HOSPITAL_COMMUNITY): Payer: Self-pay | Admitting: *Deleted

## 2014-01-17 ENCOUNTER — Observation Stay (HOSPITAL_COMMUNITY)
Admission: RE | Admit: 2014-01-17 | Discharge: 2014-01-18 | Disposition: A | Discharge status: Home / Self Care | Payer: Medicare Other | Source: Ambulatory Visit | Attending: Urology | Admitting: Urology

## 2014-01-17 ENCOUNTER — Ambulatory Visit (HOSPITAL_COMMUNITY): Payer: Medicare Other | Admitting: Anesthesiology

## 2014-01-17 ENCOUNTER — Telehealth: Payer: Self-pay | Admitting: Internal Medicine

## 2014-01-17 DIAGNOSIS — K5900 Constipation, unspecified: Secondary | ICD-10-CM | POA: Diagnosis not present

## 2014-01-17 DIAGNOSIS — M4802 Spinal stenosis, cervical region: Secondary | ICD-10-CM

## 2014-01-17 DIAGNOSIS — I509 Heart failure, unspecified: Secondary | ICD-10-CM | POA: Insufficient documentation

## 2014-01-17 DIAGNOSIS — I429 Cardiomyopathy, unspecified: Secondary | ICD-10-CM | POA: Diagnosis not present

## 2014-01-17 DIAGNOSIS — I10 Essential (primary) hypertension: Secondary | ICD-10-CM | POA: Insufficient documentation

## 2014-01-17 DIAGNOSIS — C671 Malignant neoplasm of dome of bladder: Secondary | ICD-10-CM | POA: Diagnosis not present

## 2014-01-17 DIAGNOSIS — C679 Malignant neoplasm of bladder, unspecified: Secondary | ICD-10-CM | POA: Diagnosis not present

## 2014-01-17 DIAGNOSIS — D649 Anemia, unspecified: Secondary | ICD-10-CM | POA: Diagnosis not present

## 2014-01-17 DIAGNOSIS — J449 Chronic obstructive pulmonary disease, unspecified: Secondary | ICD-10-CM | POA: Diagnosis not present

## 2014-01-17 DIAGNOSIS — D494 Neoplasm of unspecified behavior of bladder: Secondary | ICD-10-CM

## 2014-01-17 DIAGNOSIS — F17210 Nicotine dependence, cigarettes, uncomplicated: Secondary | ICD-10-CM | POA: Insufficient documentation

## 2014-01-17 DIAGNOSIS — K219 Gastro-esophageal reflux disease without esophagitis: Secondary | ICD-10-CM | POA: Diagnosis not present

## 2014-01-17 DIAGNOSIS — Z79899 Other long term (current) drug therapy: Secondary | ICD-10-CM | POA: Insufficient documentation

## 2014-01-17 HISTORY — PX: TRANSURETHRAL RESECTION OF BLADDER TUMOR: SHX2575

## 2014-01-17 LAB — BASIC METABOLIC PANEL
Anion gap: 11 (ref 5–15)
BUN: 16 mg/dL (ref 6–23)
CALCIUM: 9.9 mg/dL (ref 8.4–10.5)
CO2: 26 meq/L (ref 19–32)
CREATININE: 0.61 mg/dL (ref 0.50–1.10)
Chloride: 101 mEq/L (ref 96–112)
GFR calc Af Amer: >90 mL/min (ref >90)
GFR calc non Af Amer: 87 mL/min — ABNORMAL LOW (ref >90)
GLUCOSE: 102 mg/dL — AB (ref 70–99)
Potassium: 4.2 mEq/L (ref 3.7–5.3)
Sodium: 138 mEq/L (ref 137–147)

## 2014-01-17 LAB — CBC
HEMATOCRIT: 35.6 % — AB (ref 36.0–46.0)
HEMOGLOBIN: 11.6 g/dL — AB (ref 12.0–15.0)
MCH: 25.7 pg — AB (ref 26.0–34.0)
MCHC: 32.6 g/dL (ref 30.0–36.0)
MCV: 78.9 fL (ref 78.0–100.0)
Platelets: 233 10*3/uL (ref 150–400)
RBC: 4.51 MIL/uL (ref 3.87–5.11)
WBC: 6.3 10*3/uL (ref 4.0–10.5)

## 2014-01-17 SURGERY — TURBT (TRANSURETHRAL RESECTION OF BLADDER TUMOR)
Anesthesia: Spinal

## 2014-01-17 MED ORDER — NICOTINE 14 MG/24HR TD PT24
14.0000 mg | MEDICATED_PATCH | TRANSDERMAL | Status: DC
  Filled 2014-01-17 (×2): qty 1

## 2014-01-17 MED ORDER — FENTANYL CITRATE 0.05 MG/ML IJ SOLN: 25.0000 ug | INTRAMUSCULAR | Status: DC | PRN

## 2014-01-17 MED ORDER — CEFAZOLIN SODIUM-DEXTROSE 2-3 GM-% IV SOLR
INTRAVENOUS | Status: AC
  Filled 2014-01-17: qty 50

## 2014-01-17 MED ORDER — SUCRALFATE 1 G PO TABS
1.0000 g | ORAL_TABLET | Freq: Four times a day (QID) | ORAL | Status: DC
  Administered 2014-01-17 (×2): 1 g via ORAL
  Filled 2014-01-17 (×7): qty 1

## 2014-01-17 MED ORDER — BELLADONNA ALKALOIDS-OPIUM 16.2-60 MG RE SUPP
RECTAL | Status: AC
  Filled 2014-01-17: qty 1

## 2014-01-17 MED ORDER — BUPIVACAINE IN DEXTROSE 0.75-8.25 % IT SOLN
INTRATHECAL | Status: DC | PRN
  Administered 2014-01-17: 1.5 mL via INTRATHECAL

## 2014-01-17 MED ORDER — PROPOFOL 10 MG/ML IV EMUL
INTRAVENOUS | Status: DC | PRN
  Administered 2014-01-17: 10 mg via INTRAVENOUS
  Administered 2014-01-17: 30 mg via INTRAVENOUS
  Administered 2014-01-17: 10 mg via INTRAVENOUS
  Administered 2014-01-17: 20 mg via INTRAVENOUS
  Administered 2014-01-17: 10 mg via INTRAVENOUS
  Administered 2014-01-17 (×2): 20 mg via INTRAVENOUS
  Administered 2014-01-17 (×3): 10 mg via INTRAVENOUS

## 2014-01-17 MED ORDER — METOPROLOL SUCCINATE ER 25 MG PO TB24
25.0000 mg | ORAL_TABLET | Freq: Every day | ORAL | Status: DC
  Administered 2014-01-17: 25 mg via ORAL
  Filled 2014-01-17 (×2): qty 1

## 2014-01-17 MED ORDER — ONDANSETRON HCL 4 MG/2ML IJ SOLN: 4.0000 mg | INTRAMUSCULAR | Status: DC | PRN

## 2014-01-17 MED ORDER — FENTANYL CITRATE 0.05 MG/ML IJ SOLN
INTRAMUSCULAR | Status: AC
  Filled 2014-01-17: qty 5

## 2014-01-17 MED ORDER — MIDAZOLAM HCL 5 MG/5ML IJ SOLN
INTRAMUSCULAR | Status: DC | PRN
Start: 2014-01-17 — End: 2014-01-17
  Administered 2014-01-17 (×2): 1 mg via INTRAVENOUS

## 2014-01-17 MED ORDER — LISINOPRIL 5 MG PO TABS
5.0000 mg | ORAL_TABLET | Freq: Every day | ORAL | Status: DC
  Administered 2014-01-17: 5 mg via ORAL
  Filled 2014-01-17 (×2): qty 1

## 2014-01-17 MED ORDER — HYOSCYAMINE SULFATE 0.125 MG SL SUBL
0.1250 mg | SUBLINGUAL_TABLET | SUBLINGUAL | Status: DC | PRN
  Filled 2014-01-17: qty 1

## 2014-01-17 MED ORDER — LIDOCAINE HCL (CARDIAC) 20 MG/ML IV SOLN
INTRAVENOUS | Status: AC
  Filled 2014-01-17: qty 5

## 2014-01-17 MED ORDER — ONDANSETRON HCL 4 MG/2ML IJ SOLN
INTRAMUSCULAR | Status: AC
  Filled 2014-01-17: qty 2

## 2014-01-17 MED ORDER — HYDROCODONE-ACETAMINOPHEN 5-325 MG PO TABS
1.0000 | ORAL_TABLET | ORAL | Status: DC | PRN
Start: 2014-01-17 — End: 2014-01-18
  Administered 2014-01-17 – 2014-01-18 (×2): 2 via ORAL
  Filled 2014-01-17 (×2): qty 2

## 2014-01-17 MED ORDER — PANTOPRAZOLE SODIUM 40 MG PO TBEC
40.0000 mg | DELAYED_RELEASE_TABLET | Freq: Two times a day (BID) | ORAL | Status: DC
  Administered 2014-01-17 (×2): 40 mg via ORAL
  Filled 2014-01-17 (×4): qty 1

## 2014-01-17 MED ORDER — PROMETHAZINE HCL 25 MG/ML IJ SOLN: 6.2500 mg | INTRAMUSCULAR | Status: DC | PRN

## 2014-01-17 MED ORDER — MIDAZOLAM HCL 2 MG/2ML IJ SOLN
INTRAMUSCULAR | Status: AC
  Filled 2014-01-17: qty 2

## 2014-01-17 MED ORDER — PROPOFOL 10 MG/ML IV BOLUS
INTRAVENOUS | Status: AC
  Filled 2014-01-17: qty 20

## 2014-01-17 MED ORDER — ACETAMINOPHEN 325 MG PO TABS: 650.0000 mg | ORAL_TABLET | ORAL | Status: DC | PRN

## 2014-01-17 MED ORDER — SODIUM CHLORIDE 0.9 % IR SOLN
Status: DC | PRN
  Administered 2014-01-17: 21000 mL

## 2014-01-17 MED ORDER — ONDANSETRON HCL 4 MG/2ML IJ SOLN
INTRAMUSCULAR | Status: DC | PRN
  Administered 2014-01-17: 4 mg via INTRAVENOUS

## 2014-01-17 MED ORDER — ONDANSETRON HCL 4 MG/2ML IJ SOLN: INTRAMUSCULAR | Status: DC | PRN

## 2014-01-17 MED ORDER — CEFAZOLIN SODIUM-DEXTROSE 2-3 GM-% IV SOLR
2.0000 g | INTRAVENOUS | Status: AC
  Administered 2014-01-17: 2 g via INTRAVENOUS

## 2014-01-17 MED ORDER — LACTATED RINGERS IV SOLN
INTRAVENOUS | Status: DC | PRN
  Administered 2014-01-17: 10:00:00 via INTRAVENOUS

## 2014-01-17 MED ORDER — SODIUM CHLORIDE 0.9 % IV SOLN
INTRAVENOUS | Status: DC
  Administered 2014-01-17 – 2014-01-18 (×2): via INTRAVENOUS

## 2014-01-17 MED ORDER — CEPHALEXIN 500 MG PO CAPS
500.0000 mg | ORAL_CAPSULE | Freq: Every day | ORAL | Status: DC
  Administered 2014-01-17: 500 mg via ORAL
  Filled 2014-01-17 (×2): qty 1

## 2014-01-17 MED ORDER — CEPHALEXIN 500 MG PO CAPS: 500.0000 mg | ORAL_CAPSULE | Freq: Every day | ORAL | Status: DC

## 2014-01-17 MED ORDER — CETYLPYRIDINIUM CHLORIDE 0.05 % MT LIQD
7.0000 mL | Freq: Two times a day (BID) | OROMUCOSAL | Status: DC
  Administered 2014-01-17: 7 mL via OROMUCOSAL

## 2014-01-17 SURGICAL SUPPLY — 19 items
BAG URINE DRAINAGE (UROLOGICAL SUPPLIES) ×2 IMPLANT
BAG URO CATCHER STRL LF (DRAPE) ×3 IMPLANT
CATH FOLEY 2WAY SLVR  5CC 18FR (CATHETERS) ×2
CATH FOLEY 2WAY SLVR 5CC 18FR (CATHETERS) IMPLANT
DRAPE CAMERA CLOSED 9X96 (DRAPES) ×3 IMPLANT
ELECT BUTTON HF 24-28F 2 30DE (ELECTRODE) ×1 IMPLANT
ELECT LOOP MED HF 24F 12D (CUTTING LOOP) ×1 IMPLANT
ELECT LOOP MED HF 24F 12D CBL (CLIP) ×3 IMPLANT
ELECT REM PT RETURN 9FT ADLT (ELECTROSURGICAL)
ELECT RESECT VAPORIZE 12D CBL (ELECTRODE) IMPLANT
ELECTRODE REM PT RTRN 9FT ADLT (ELECTROSURGICAL) ×1 IMPLANT
GLOVE BIOGEL M STRL SZ7.5 (GLOVE) ×11 IMPLANT
GOWN STRL REUS W/TWL XL LVL3 (GOWN DISPOSABLE) ×7 IMPLANT
KIT ASPIRATION TUBING (SET/KITS/TRAYS/PACK) IMPLANT
MANIFOLD NEPTUNE II (INSTRUMENTS) ×3 IMPLANT
PACK CYSTO (CUSTOM PROCEDURE TRAY) ×3 IMPLANT
SYRINGE IRR TOOMEY STRL 70CC (SYRINGE) ×2 IMPLANT
TUBING CONNECTING 10 (TUBING) ×2 IMPLANT
TUBING CONNECTING 10' (TUBING) ×1

## 2014-01-17 NOTE — Telephone Encounter (Signed)
Patients' daughter called  For refill on Hydrocodone stated patient has been taking 2 pills every eight hours. Last fill 12/19/13 Ok to fill?

## 2014-01-17 NOTE — Anesthesia Preprocedure Evaluation (Addendum)
Anesthesia Evaluation    Airway Mallampati: II  TM Distance: >3 FB Neck ROM: Full    Dental no notable dental hx.    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%   Neuro/Psych    GI/Hepatic PUD, GERD-  Medicated,  Endo/Other    Renal/GU      Musculoskeletal   Abdominal   Peds  Hematology  (+) anemia ,   Anesthesia Other Findings   Reproductive/Obstetrics                            Anesthesia Physical Anesthesia Plan  ASA: III  Anesthesia Plan: Spinal   Post-op Pain Management:    Induction: Intravenous  Airway Management Planned:   Additional Equipment:   Intra-op Plan:   Post-operative Plan: Extubation in OR  Informed Consent: I have reviewed the patients History and Physical, chart, labs and discussed the procedure including the risks, benefits and alternatives for the proposed anesthesia with the patient or authorized representative who has indicated his/her understanding and acceptance.   Dental advisory given  Plan Discussed with: CRNA  Anesthesia Plan Comments: (Discussed spinal and general. Had spinal last month and it worked well. Discussed risks/benefits of spinal including headache, backache, failure, bleeding, infection, and nerve damage. Patient consents to spinal. Questions answered. On no anticoagulants.  Judicious fluid administration.)       Anesthesia Quick Evaluation

## 2014-01-17 NOTE — Op Note (Signed)
Preoperative Diagnosis: Bladder cancer Postoperative diagnosis: Bladder cancer  Procedure: TURBT greater than 5 cm  Surgeon: Junious Silk  Anesthesia: Denenny  Type of anesthesia: Spinal  Indication for procedure: [name] is a 73 year old with multifocal bladder cancer. There remains a large tumor at the dome of her bladder after a staged procedure. She presents for TURBT today.  Findings: On exam under anesthesia the patient is very thin and palpation of her abdomen on bimanual exam is readily performed. I can feel some fullness in the bladder area but I can't say for sure I can palpate a specific bladder mass.  On cystoscopy the prior tumor resections appear complete. The large tumor at the dome remains and after resecting several layers it appears to be clinically muscle invasive in the center. depending on the pathology she may need a third resection of the base but I was down to muscle covering the majority of the tumor at the end of the resection. It's a very large irregular deep tumor.   There was excellent hemostasis. There was equal return throughout the case.   Description of procedure: After consent was obtained patient brought to the operating room. After adequate anesthesia she was placed in lithotomy position and prepped and draped in the usual sterile fashion. A timeout was performed to confirm the patient and procedure. Exam under anesthesia was performed. The cystoscope was passed per urethra and the bladder carefully examined. The resectoscope sheath was then passed with the visual obturator then the loop and handle were passed. I resected the tumor down level with the bladder wall. This was sent as bladder tumor. I then resected a second time down to muscle and the majority of the tumor. Once the bladder was emptied there was still thickness of the central part of the tumor and this was resected carefully 2-3 more times. Parts of the bladder wall were very thin but there was no  perforation. There may be some residual tumor but I felt like we had done enough during this anesthetic and it was time to stop to avoid perforation. There was excellent hemostasis. All the chips were evacuated and these were sent as tumor base. An 79 French Foley was placed draining clear urine. She was awakened taken to recovery room in stable condition.   Complications: None Blood loss: Minimal  Drains: 18 French Foley  Specimens to pathology: #1 bladder tumor #2 bladder tumor base  Patient disposition: Patient stable to PACU.

## 2014-01-17 NOTE — Anesthesia Procedure Notes (Signed)
Spinal  Patient location during procedure: OR Staffing Anesthesiologist: Salley Scarlet Performed by: anesthesiologist  Preanesthetic Checklist Completed: patient identified, site marked, surgical consent, pre-op evaluation, timeout performed, IV checked, risks and benefits discussed and monitors and equipment checked Spinal Block Patient position: sitting Prep: Betadine Patient monitoring: heart rate, continuous pulse ox and blood pressure Approach: midline Location: L3-4 Injection technique: single-shot Needle Needle type: Spinocan  Needle gauge: 22 G Needle length: 9 cm Additional Notes Expiration date of kit checked and confirmed. Patient tolerated procedure well, without complications. Verbal contact maintained throughout procedure. CSF clear. No paresthesias.

## 2014-01-17 NOTE — Progress Notes (Signed)
Patient is down to go home today but she wants to spend the night. Dr. Junious Silk spoke to her on phone re: spending the night or possibly going home today. She is will do whatever he thinks is best.

## 2014-01-17 NOTE — Transfer of Care (Signed)
Immediate Anesthesia Transfer of Care Note  Patient: [name]  Procedure(s) Performed: Procedure(s): TRANSURETHRAL RESECTION OF BLADDER TUMOR (TURBT) (N/A)  Patient Location: PACU  Anesthesia Type:Regional  Level of Consciousness: awake, alert  and oriented  Airway & Oxygen Therapy: Patient Spontanous Breathing and Patient connected to face mask oxygen  Post-op Assessment: Report given to PACU RN and Post -op Vital signs reviewed and stable  Post vital signs: Reviewed and stable  Complications: No apparent anesthesia complications

## 2014-01-17 NOTE — Interval H&P Note (Signed)
History and Physical Interval Note:  01/17/2014 11:30 AM  [name]  has presented today for surgery, with the diagnosis of Bladder Cancer  The various methods of treatment have been discussed with the patient and family. After consideration of risks, benefits and other options for treatment, the patient has consented to  Procedure(s): TRANSURETHRAL RESECTION OF BLADDER TUMOR (TURBT) (N/A) as a surgical intervention .  The patient's history has been reviewed, patient examined, no change in status, stable for surgery.  I have reviewed the patient's chart and labs.  Questions were answered to the patient's satisfaction.  Pt denies gross hematuria, dysuria, fever.    [name]

## 2014-01-17 NOTE — Progress Notes (Signed)
Upon shift report, RN found pt to have clot in foley tubing. Urine was not draining through tubing. RN hand irrigated pt's foley x2 at this time. First irrigation of 79mL returned full syringe of urine with large amount of small clots; 2nd irrigation returned of 40 mL returned full syringe of clear pink urine. Will continue to monitor pt closely. Carnella Guadal[place]ara I

## 2014-01-17 NOTE — Anesthesia Postprocedure Evaluation (Signed)
  Anesthesia Post-op Note  Patient: [name]  Procedure(s) Performed: Procedure(s) (LRB): TRANSURETHRAL RESECTION OF BLADDER TUMOR (TURBT) (N/A)  Patient Location: PACU  Anesthesia Type: Spinal  Level of Consciousness: awake and alert   Airway and Oxygen Therapy: Patient Spontanous Breathing  Post-op Pain: mild  Post-op Assessment: Post-op Vital signs reviewed, Patient's Cardiovascular Status Stable, Respiratory Function Stable, Patent Airway and No signs of Nausea or vomiting  Last Vitals:  Filed Vitals:   01/17/14 1425  BP: 151/70  Pulse: 50  Temp: 36.4 C  Resp: 15    Post-op Vital Signs: stable   Complications: No apparent anesthesia complications

## 2014-01-17 NOTE — Progress Notes (Signed)
Omit Metoprolol pre op per Dr. Delma Post

## 2014-01-17 NOTE — Discharge Instructions (Signed)
Foley Catheter Care A Foley catheter is a soft, flexible tube. This tube is placed into your bladder to drain pee (urine). If you go home with this catheter in place, follow the instructions below. TAKING CARE OF THE CATHETER 1. Wash your hands with soap and water. 2. Put soap and water on a clean washcloth.  Clean the skin where the tube goes into your body.  Clean away from the tube site.  Never wipe toward the tube.  Clean the area using a circular motion.  Remove all the soap. Pat the area dry with a clean towel. For males, reposition the skin that covers the end of the penis (foreskin). 3. Attach the tube to your leg with tape or a leg strap. Do not stretch the tube tight. If you are using tape, remove any stickiness left behind by past tape you used. 4. Keep the drainage bag below your hips. Keep it off the floor. 5. Check your tube during the day. Make sure it is working and draining. Make sure the tube does not curl, twist, or bend. 6. Do not pull on the tube or try to take it out. TAKING CARE OF THE DRAINAGE BAGS You will have a large overnight drainage bag and a small leg bag. You may wear the overnight bag any time. Never wear the small bag at night. Follow the directions below. Emptying the Drainage Bag Empty your drainage bag when it is  - full or at least 2-3 times a day. 1. Wash your hands with soap and water. 2. Keep the drainage bag below your hips. 3. Hold the dirty bag over the toilet or clean container. 4. Open the pour spout at the bottom of the bag. Empty the pee into the toilet or container. Do not let the pour spout touch anything. 5. Clean the pour spout with a gauze pad or cotton ball that has rubbing alcohol on it. 6. Close the pour spout. 7. Attach the bag to your leg with tape or a leg strap. 8. Wash your hands well. Changing the Drainage Bag Change your bag once a month or sooner if it starts to smell or look dirty.  1. Wash your hands with soap and  water. 2. Pinch the rubber tube so that pee does not spill out. 3. Disconnect the catheter tube from the drainage tube at the connection valve. Do not let the tubes touch anything. 4. Clean the end of the catheter tube with an alcohol wipe. Clean the end of a the drainage tube with a different alcohol wipe. 5. Connect the catheter tube to the drainage tube of the clean drainage bag. 6. Attach the new bag to the leg with tape or a leg strap. Avoid attaching the new bag too tightly. 7. Wash your hands well. Cleaning the Drainage Bag 1. Wash your hands with soap and water. 2. Wash the bag in warm, soapy water. 3. Rinse the bag with warm water. 4. Fill the bag with a mixture of white vinegar and water (1 cup vinegar to 1 quart warm water [.2 liter vinegar to 1 liter warm water]). Close the bag and soak it for 30 minutes in the solution. 5. Rinse the bag with warm water. 6. Hang the bag to dry with the pour spout open and hanging downward. 7. Store the clean bag (once it is dry) in a clean plastic bag. 8. Wash your hands well. PREVENT INFECTION  Wash your hands before and after touching your tube.  Take showers every day. Wash the skin where the tube enters your body. Do not take baths. Replace wet leg straps with dry ones, if this applies.  Do not use powders, sprays, or lotions on the genital area. Only use creams, lotions, or ointments as told by your doctor.  For females, wipe from front to back after going to the bathroom.  Drink enough fluids to keep your pee clear or pale yellow unless you are told not to have too much fluid (fluid restriction).  Do not let the drainage bag or tubing touch or lie on the floor.  Wear cotton underwear to keep the area dry. GET HELP IF:  Your pee is cloudy or smells unusually bad.  Your tube becomes clogged.  You are not draining pee into the bag or your bladder feels full.  Your tube starts to leak. GET HELP RIGHT AWAY IF:  You have pain,  puffiness (swelling), redness, or yellowish-white fluid (pus) where the tube enters the body.  You have pain in the belly (abdomen), legs, lower back, or bladder.  You have a fever.  You see blood fill the tube, or your pee is pink or red.  You feel sick to your stomach (nauseous), throw up (vomit), or have chills.  Your tube gets pulled out. MAKE SURE YOU:   Understand these instructions.  Will watch your condition.  Will get help right away if you are not doing well or get worse. Document Released: 07/02/2012 Document Revised: 07/22/2013 Document Reviewed: 07/02/2012 [facility] Patient Information 2015 [place], [place]. This information is not intended to replace advice given to you by your health care provider. Make sure you discuss any questions you have with your health care provider.  Cystoscopy, Care After Refer to this sheet in the next few weeks. These instructions provide you with information on caring for yourself after your procedure. Your caregiver may also give you more specific instructions. Your treatment has been planned according to current medical practices, but problems sometimes occur. Call your caregiver if you have any problems or questions after your procedure. HOME CARE INSTRUCTIONS  Things you can do to ease any discomfort after your procedure include:  Drinking enough water and fluids to keep your urine clear or pale yellow.  Taking a warm bath to relieve any burning feelings. SEEK IMMEDIATE MEDICAL CARE IF:   You have an increase in blood in your urine.  You notice blood clots in your urine.  You have difficulty passing urine.  You have the chills.  You have abdominal pain.  You have a fever or persistent symptoms for more than 2-3 days.  You have a fever and your symptoms suddenly get worse. MAKE SURE YOU:   Understand these instructions.  Will watch your condition.  Will get help right away if you are not doing well or get worse. Document  Released: 09/24/2004 Document Revised: 11/07/2012 Document Reviewed: 08/29/2011 [facility] Patient Information 2015 [place], [place]. This information is not intended to replace advice given to you by your health care provider. Make sure you discuss any questions you have with your health care provider.

## 2014-01-17 NOTE — H&P (Signed)
History of Present Illness This is a new patient. She was seen as a hospital consult for gross hematuria with CT scan of the abdomen and pelvis revealed multiple bladder tumors. There was no evidence of upper tract urothelial carcinoma nor metastatic disease. She has a history of bladder cancer in 1995 when she saw Dr. Leory Plowman.    She was recently taken for exam under anesthesia which was normal. She underwent staged TURBT for multiple bladder tumors. This revealed high-grade TA disease. Some deep biopsies that showed muscle were negative.     She is well today. She is having some gross hematuria but no clots. She is voiding without difficulty. She has no dysuria.   Past Medical History Problems  1. History of Chronic constipation (564.00) 2. History of bladder cancer (V10.51) 3. History of cardiac arrhythmia (V12.59) 4. History of cardiac disorder (V12.50) 5. History of degenerative disc disease (V13.59) 6. History of gastric ulcer (V12.79) 7. History of hypertension (V12.59)  Surgical History Problems  1. History of Back Surgery 2. History of Bladder Surgery 3. History of Cystoscopy With Fulguration Large Lesion (Over 5cm)  Current Meds 1. Iron TABS;  Therapy: (Recorded:30Sep2015) to Recorded 2. Lidocaine PTCH;  Therapy: (Recorded:30Sep2015) to Recorded 3. Lisinopril 5 MG Oral Tablet;  Therapy: (Recorded:30Sep2015) to Recorded 4. Metoclopramide HCl - 5 MG Oral Tablet;  Therapy: (Recorded:30Sep2015) to Recorded 5. Pantoprazole Sodium 40 MG Oral Tablet Delayed Release;  Therapy: (Recorded:30Sep2015) to Recorded 6. Sucralfate 1 GM Oral Tablet;  Therapy: (Recorded:30Sep2015) to Recorded 7. TraMADol HCl - 50 MG Oral Tablet;  Therapy: (Recorded:30Sep2015) to Recorded 8. Tylenol CAPS;  Therapy: (Recorded:30Sep2015) to Recorded  Allergies Medication  1. No Known Drug Allergies  Family History Problems  1. Family history of Colon cancer : Brother 2. Family history of  Death : Mother, Father   Mother died at age 58 from lung cancerFather died at age 68 from liver cancer 3. Family history of liver cancer (V16.0) : Father 4. Family history of lung cancer (V16.1) : Mother  Social History Problems    Denied: History of Alcohol use   Caffeine use (V49.89)   2 cups per day   Current every day smoker (305.1)   Smokes 1.5 PPD, has smoked for 84 years   Six children   2 sons, 4 daughters   Widowed  Review of Systems Genitourinary, constitutional, skin, eye, otolaryngeal, hematologic/lymphatic, cardiovascular, pulmonary, endocrine, musculoskeletal, gastrointestinal, neurological and psychiatric system(s) were reviewed and pertinent findings if present are noted.  Gastrointestinal: nausea, vomiting, heartburn and constipation.  Constitutional: feeling tired (fatigue) and recent weight loss.  Cardiovascular: chest pain.  Respiratory: shortness of breath.  Musculoskeletal: back pain.    Vitals Vital Signs [Data Includes: Last 1 Day]  Recorded: 30Sep2015 04:23PM  Height: 5 ft 8.75 in Weight: 118 lb 12.8 oz BMI Calculated: 17.67 BSA Calculated: 1.65 Blood Pressure: 130 / 82 Temperature: 98 F Heart Rate: 78  Physical Exam Constitutional: Well nourished and well developed . No acute distress.  Skin: Normal skin turgor, no visible rash and no visible skin lesions.    Results/Data Urine [Data Includes: Last 1 Day]   [id number]  COLOR AMBER   APPEARANCE CLOUDY   SPECIFIC GRAVITY <1.005   pH 5.5   GLUCOSE NEG mg/dL  BILIRUBIN NEG   KETONE NEG mg/dL  BLOOD LARGE   PROTEIN 30 mg/dL  UROBILINOGEN 0.2 mg/dL  NITRITE NEG   LEUKOCYTE ESTERASE TRACE   SQUAMOUS EPITHELIAL/HPF RARE   WBC 0-2 WBC/hpf  RBC  TNTC RBC/hpf  BACTERIA RARE   CRYSTALS NONE SEEN   CASTS NONE SEEN    Assessment Assessed  1. 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 Malignant neoplasm of lateral  wall of urinary bladder (188.2) 4. Gross hematuria (599.71)  Plan Gross hematuria  1. URINE CULTURE; Status:Hold For - Specimen/Data Collection,Appointment; Requested  for:30Sep2015;  Health Maintenance  2. 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 Follow-up Schedule Surgery Office  Follow-up  Status: Hold For - Appointment   Requested for: [phone]  Discussion/Summary I discussed with the patient and her daughter her stage and grade. We discussed the nature risk and benefits of continued endoscopic management with alternatives being radical cystectomy. She is not a good candidate for chemotherapy and radiation. All questions answered. We will proceed with continued staged TURBT. I didn't make any guarantees I could get rid of all the cancer this next time. I may try to sample the bladder neck again to get some muscle. We discussed the real risk of bladder perforation and cardiovascular complications among others. We discussed in the long run the nature of high-grade bladder tumors to recur in progress. This will need to be closely monitored. We discussed we may need to consider BCG in the future. We used a bladder cancer booklet and her path report. All questions answered. She elects to proceed. We discussed the kink guarantee that the posterior tumor that remains is not invasive but we'll see.      Signatures Electronically signed by : [name], M.D.; Dec 18 2013  4:59PM EST

## 2014-01-18 DIAGNOSIS — C679 Malignant neoplasm of bladder, unspecified: Secondary | ICD-10-CM | POA: Diagnosis not present

## 2014-01-18 NOTE — Progress Notes (Signed)
Pt. Husband states foley was not to be d/c, talked with dr. Karsten Ro and he state to leave foley in.

## 2014-01-18 NOTE — Discharge Summary (Signed)
Physician Discharge Summary  Patient ID: [name] MRN: [id number] DOB/AGE: 1940-09-28 73 y.o.  Admit date: 01/17/2014 Discharge date: 01/18/2014  Admission Diagnoses: Bladder tumor  Discharge Diagnoses:  Active Problems:   Bladder neoplasm   Discharged Condition: good  Hospital Course: She underwent transurethral resection of her bladder tumor yesterday. She was observed overnight with a Foley catheter indwelling. She has no complaints at this time and specifically denies any abdominal pain. The Foley catheter is draining and is slightly pink but has no clots. She is felt ready for discharge.    Discharge Exam: Blood pressure 132/64, pulse 52, temperature 97.7 F (36.5 C), temperature source Oral, resp. rate 16, height 5\' 8"  (1.727 m), weight 53.34 kg (117 lb 9.5 oz), SpO2 100.00%. Abdomen is soft, flat and nontender. Foley catheter is indwelling draining light pink urine with no clots.  Disposition: 01-Home or Self Care  Discharge Instructions   Discharge patient    Complete by:  As directed      Foley catheter - discontinue    Complete by:  As directed             Medication List         cephALEXin 500 MG capsule  Commonly known as:  KEFLEX  Take 1 capsule (500 mg total) by mouth at bedtime.     HYDROcodone-acetaminophen 5-325 MG per tablet  Commonly known as:  NORCO/VICODIN  Take 1 tablet by mouth every 6 (six) hours as needed for moderate pain.     ILEVRO 0.3 % Susp  Generic drug:  Nepafenac  Apply 1 drop to eye daily. Operative eye.     lisinopril 5 MG tablet  Commonly known as:  PRINIVIL,ZESTRIL  Take 5 mg by mouth every morning.     metoprolol succinate 25 MG 24 hr tablet  Commonly known as:  TOPROL-XL  Take 25 mg by mouth daily at 12 noon.     nicotine 14 mg/24hr patch  Commonly known as:  NICODERM CQ - dosed in mg/24 hours  Place 14 mg onto the skin every morning.     pantoprazole 40 MG tablet  Commonly known as:  PROTONIX  Take 40 mg by  mouth 2 (two) times daily.     polyethylene glycol-electrolytes 420 G solution  Commonly known as:  NuLYTELY/GoLYTELY  Take 4,000 mLs by mouth once.     PRED FORTE 1 % ophthalmic suspension  Generic drug:  prednisoLONE acetate  1 drop 4 (four) times daily. Operative eye.     SLOW FE PO  Take 1 tablet by mouth 2 (two) times daily. 142 mg twice daily     sucralfate 1 G tablet  Commonly known as:  CARAFATE  Take 1 g by mouth 4 (four) times daily.           Follow-up Information   Follow up with [name], MD In 1 week.   Specialty:  Urology   Contact information:   [place] [address] [phone]       Signed: Claybon Jabs 01/18/2014, 6:55 AM

## 2014-01-18 NOTE — Progress Notes (Signed)
UR completed 

## 2014-01-19 DIAGNOSIS — M4802 Spinal stenosis, cervical region: Secondary | ICD-10-CM | POA: Insufficient documentation

## 2014-01-19 MED ORDER — HYDROCODONE-ACETAMINOPHEN 10-325 MG PO TABS: 1.0000 | ORAL_TABLET | Freq: Three times a day (TID) | ORAL | Status: DC | PRN

## 2014-01-19 MED ORDER — HYDROCODONE-ACETAMINOPHEN 10-325 MG PO TABS: 1.0000 | ORAL_TABLET | Freq: Four times a day (QID) | ORAL | Status: DC | PRN

## 2014-01-19 NOTE — Telephone Encounter (Signed)
Medication has been changed to 10/325 formulation based on usage of 2 every 8 hours of the 5 mg dose.  This will allow her to use additional if needed without exceeding the 2000 mg tylenol daily allowance  Her cervical spine MRI suggests that spinal stenosis may be occurring at levels C4 thought C7 due to spurring and disk bulging.  She will need to request a copy of the images from Radiology to take with her to neurosurgeon  appt

## 2014-01-20 ENCOUNTER — Encounter (HOSPITAL_COMMUNITY): Payer: Self-pay | Admitting: Urology

## 2014-01-20 NOTE — Telephone Encounter (Signed)
Left message for patient to return call to office. 

## 2014-01-21 NOTE — Telephone Encounter (Signed)
Patient [name] [name] ) made aware of  MRI results and patient medication ready for pick up with new dosage and directions.

## 2014-01-22 DIAGNOSIS — C671 Malignant neoplasm of dome of bladder: Secondary | ICD-10-CM | POA: Diagnosis not present

## 2014-01-28 DIAGNOSIS — M4156 Other secondary scoliosis, lumbar region: Secondary | ICD-10-CM | POA: Diagnosis not present

## 2014-01-28 DIAGNOSIS — M5136 Other intervertebral disc degeneration, lumbar region: Secondary | ICD-10-CM | POA: Diagnosis not present

## 2014-01-28 DIAGNOSIS — M546 Pain in thoracic spine: Secondary | ICD-10-CM | POA: Diagnosis not present

## 2014-01-28 DIAGNOSIS — I10 Essential (primary) hypertension: Secondary | ICD-10-CM | POA: Diagnosis not present

## 2014-01-28 DIAGNOSIS — M542 Cervicalgia: Secondary | ICD-10-CM | POA: Diagnosis not present

## 2014-01-28 DIAGNOSIS — M4726 Other spondylosis with radiculopathy, lumbar region: Secondary | ICD-10-CM | POA: Diagnosis not present

## 2014-01-28 DIAGNOSIS — M549 Dorsalgia, unspecified: Secondary | ICD-10-CM | POA: Diagnosis not present

## 2014-02-05 ENCOUNTER — Encounter: Payer: Self-pay | Admitting: Internal Medicine

## 2014-02-05 ENCOUNTER — Ambulatory Visit (INDEPENDENT_AMBULATORY_CARE_PROVIDER_SITE_OTHER): Payer: Medicare Other | Admitting: Internal Medicine

## 2014-02-05 ENCOUNTER — Encounter: Payer: Self-pay | Admitting: *Deleted

## 2014-02-05 VITALS — BP 124/76 | HR 62 | Temp 98.3°F | Resp 16 | Ht 68.0 in | Wt 120.5 lb

## 2014-02-05 DIAGNOSIS — D509 Iron deficiency anemia, unspecified: Secondary | ICD-10-CM

## 2014-02-05 DIAGNOSIS — M4806 Spinal stenosis, lumbar region: Secondary | ICD-10-CM | POA: Diagnosis not present

## 2014-02-05 DIAGNOSIS — M4805 Spinal stenosis, thoracolumbar region: Secondary | ICD-10-CM | POA: Diagnosis not present

## 2014-02-05 DIAGNOSIS — C679 Malignant neoplasm of bladder, unspecified: Secondary | ICD-10-CM

## 2014-02-05 DIAGNOSIS — Z79891 Long term (current) use of opiate analgesic: Secondary | ICD-10-CM | POA: Diagnosis not present

## 2014-02-05 DIAGNOSIS — D62 Acute posthemorrhagic anemia: Secondary | ICD-10-CM | POA: Diagnosis not present

## 2014-02-05 DIAGNOSIS — M48061 Spinal stenosis, lumbar region without neurogenic claudication: Secondary | ICD-10-CM

## 2014-02-05 MED ORDER — HYDROCODONE-ACETAMINOPHEN 10-325 MG PO TABS: 1.0000 | ORAL_TABLET | Freq: Four times a day (QID) | ORAL | Status: DC | PRN

## 2014-02-05 NOTE — Patient Instructions (Addendum)
Take your pain medication at 8:00 am,  1:00,  6:00 PM  And at bedtime   (4 daily)  I have given you refills for Dec,  January and February  Referral to the pain clinic is pending  For your constipation:  Take miralax every day,  And add ExLax or Sennakot at bedtime if you go 2 days without a BM   For your anemia:   Continue the iron for one more month, then stop taking it   Get your hgb checked at Dr Olin Pia office when you have your appointment

## 2014-02-05 NOTE — Progress Notes (Signed)
Pre-visit discussion using our clinic review tool. No additional management support is needed unless otherwise documented below in the visit note. Went over narcotics agreement and patient voiced understanding and signed.

## 2014-02-05 NOTE — Progress Notes (Signed)
Patient ID: [name], female   DOB: May 03, 1940, 73 y.o.   MRN: [id number] Patient Active Problem List   Diagnosis Date Noted  . Spinal stenosis in cervical region 01/19/2014  . Constipation 12/29/2013  . Spinal stenosis of thoracolumbar region 12/27/2013  . COPD (chronic obstructive pulmonary disease) 12/27/2013  . Chronic systolic heart failure 73/53/2992  . Bladder neoplasm 12/13/2013  . Gastric ulcer 11/28/2013  . Preoperative cardiovascular examination 11/27/2013  . Congestive dilated cardiomyopathy 11/25/2013  . Bladder cancer 11/24/2013  . Hematuria 11/24/2013  . LBBB (left bundle branch block) 11/24/2013  . Atrial ectopy 11/24/2013  . Palpitations 11/22/2013  . Hyponatremia 11/22/2013  . Anemia, iron deficiency 11/22/2013  . Chest pain 11/22/2013  . Epigastric pain 11/22/2013    Subjective:  CC:   Chief Complaint  Patient presents with  . Follow-up    chronic back pain    HPI:   [name] is a 73 y.o. female who presents for follow upon chronic issues:  She was discharged from [facility] on oct 30 with foley s/.p TURBT for bladder cancer   She has Persistent Neck and back pain secondary to spinal stenosis confirmed in cervical spine and suspected in lumbar spine. She was  [place] last Tuesday and was offered nothing surgical bc of her condition and medical comorbidities. She is reqeusting referral to Pain clininc and MRI spine  Will be needed. Per daughter she has stopped walking and has not returned home to independent status due to fear of legs giving out on her,  She has been living with her daughter, and not driving either. She denies any recent falls,  But has not been left unsupervised nad is afraid to return home,  She had a week of PT at discharge but felt that the PT was not taking into account her spinal stenosis and was too difficult Aand too painful.  She is taking 3 vicodin daily to manage her pain.       Past Medical History  Diagnosis Date  .  Ectopic atrial beats   . Bladder cancer 1995  . GERD (gastroesophageal reflux disease)     h/o ulcers  . COPD (chronic obstructive pulmonary disease)   . LBBB (left bundle branch block)     Past Surgical History  Procedure Laterality Date  . Esophagogastroduodenoscopy N/A 11/28/2013    Procedure: ESOPHAGOGASTRODUODENOSCOPY (EGD);  Surgeon: Arta Silence, MD;  Location: [facility] ENDOSCOPY;  Service: Endoscopy;  Laterality: N/A;  . Back surgery    . Transurethral resection of bladder tumor N/A 12/13/2013    Procedure: TRANSURETHRAL RESECTION OF BLADDER TUMOR (TURBT);  Surgeon: [name], MD;  Location: [facility] ORS;  Service: Urology;  Laterality: N/A;  . Transurethral resection of bladder tumor N/A 01/17/2014    Procedure: TRANSURETHRAL RESECTION OF BLADDER TUMOR (TURBT);  Surgeon: [name], MD;  Location: [facility] ORS;  Service: Urology;  Laterality: N/A;       The following portions of the patient's history were reviewed and updated as appropriate: Allergies, current medications, and problem list.    Review of Systems:   Patient denies headache, fevers, malaise, unintentional weight loss, skin rash, eye pain, sinus congestion and sinus pain, sore throat, dysphagia,  hemoptysis , cough, dyspnea, wheezing, chest pain, palpitations, orthopnea, edema, abdominal pain, nausea, melena, diarrhea, constipation, flank pain, dysuria, hematuria, urinary  Frequency, nocturia, numbness, tingling, seizures,  Focal weakness, Loss of consciousness,  Tremor, insomnia, depression, anxiety, and suicidal ideation.     History   Social  History  . Marital Status: Widowed    Spouse Name: N/A    Number of Children: N/A  . Years of Education: N/A   Occupational History  . Not on file.   Social History Main Topics  . Smoking status: Former Smoker -- 1.50 packs/day for 50 years    Types: Cigarettes    Quit date: 11/22/2013  . Smokeless tobacco: Never Used  . Alcohol Use: No  . Drug Use: No  .  Sexual Activity: Not on file   Other Topics Concern  . Not on file   Social History Narrative    Objective:  Filed Vitals:   02/05/14 1121  BP: 124/76  Pulse: 62  Temp: 98.3 F (36.8 C)  Resp: 16     General appearance: alert, cooperative and appears stated age Ears: normal TM's and external ear canals both ears Throat: lips, mucosa, and tongue normal; teeth and gums normal Neck: no adenopathy, no carotid bruit, supple, symmetrical, trachea midline and thyroid not enlarged, symmetric, no tenderness/mass/nodules Back: symmetric, no curvature. ROM normal. No CVA tenderness. Lungs: clear to auscultation bilaterally Heart: regular rate and rhythm, S1, S2 normal, no murmur, click, rub or gallop Abdomen: soft, non-tender; bowel sounds normal; no masses,  no organomegaly Pulses: 2+ and symmetric Skin: Skin color, texture, turgor normal. No rashes or lesions Lymph nodes: Cervical, supraclavicular, and axillary nodes normal.  Assessment and Plan:  Spinal stenosis of thoracolumbar region Suggested by history of scoliosis,  Prior surgery and plain films in September showing severe DDD.   Modification of vicodin regimen made today and review of bowel regimen  . Pain clinic referral and MRI in [place] .  RX for rolling walker given as she has decreased confidence in ambulation because of weakness in legs. Home PT advised to increase her mobility.   Bladder cancer S/p TURBT Oct 2015. Foley was removed without subsequent difficulty in voiding.   Anemia, posthemorrhagic, acute Advised to continue iron for one more month, then stop.  Repeat CBC in 3 months    Updated Medication List Outpatient Encounter Prescriptions as of 02/05/2014  Medication Sig  . Ferrous Sulfate (SLOW FE PO) Take 1 tablet by mouth 2 (two) times daily. 142 mg twice daily  . HYDROcodone-acetaminophen (NORCO) 10-325 MG per tablet Take 1 tablet by mouth every 6 (six) hours as needed for severe pain.  Marland Kitchen lisinopril  (PRINIVIL,ZESTRIL) 5 MG tablet Take 5 mg by mouth every morning.   . metoprolol succinate (TOPROL-XL) 25 MG 24 hr tablet Take 25 mg by mouth daily at 12 noon.  . pantoprazole (PROTONIX) 40 MG tablet Take 40 mg by mouth 2 (two) times daily.  . polyethylene glycol (MIRALAX / GLYCOLAX) packet Take 17 g by mouth daily.  . sucralfate (CARAFATE) 1 G tablet Take 1 g by mouth 4 (four) times daily.  . [DISCONTINUED] HYDROcodone-acetaminophen (NORCO) 10-325 MG per tablet Take 1 tablet by mouth every 6 (six) hours as needed for severe pain.  . [DISCONTINUED] HYDROcodone-acetaminophen (NORCO) 10-325 MG per tablet Take 1 tablet by mouth every 6 (six) hours as needed for severe pain.  . [DISCONTINUED] HYDROcodone-acetaminophen (NORCO) 10-325 MG per tablet Take 1 tablet by mouth every 6 (six) hours as needed for severe pain.  . polyethylene glycol-electrolytes (NULYTELY/GOLYTELY) 420 G solution Take 4,000 mLs by mouth once.  . [DISCONTINUED] cephALEXin (KEFLEX) 500 MG capsule Take 1 capsule (500 mg total) by mouth at bedtime.  . [DISCONTINUED] Nepafenac (ILEVRO) 0.3 % SUSP Apply 1 drop to eye  daily. Operative eye.  . [DISCONTINUED] nicotine (NICODERM CQ - DOSED IN MG/24 HOURS) 14 mg/24hr patch Place 14 mg onto the skin every morning.  . [DISCONTINUED] prednisoLONE acetate (PRED FORTE) 1 % ophthalmic suspension 1 drop 4 (four) times daily. Operative eye.

## 2014-02-08 DIAGNOSIS — D62 Acute posthemorrhagic anemia: Secondary | ICD-10-CM | POA: Insufficient documentation

## 2014-02-08 NOTE — Assessment & Plan Note (Signed)
S/p TURBT Oct 2015. Foley was removed without subsequent difficulty in voiding.

## 2014-02-08 NOTE — Assessment & Plan Note (Signed)
Suggested by history of scoliosis,  Prior surgery and plain films in September showing severe DDD.   Modification of vicodin regimen made today and review of bowel regimen  . Pain clinic referral and MRI in [place] .  RX for rolling walker given as she has decreased confidence in ambulation because of weakness in legs.

## 2014-02-08 NOTE — Assessment & Plan Note (Signed)
Advised to continue iron for one more month, then stop.  Repeat CBC in 3 months

## 2014-02-12 ENCOUNTER — Encounter: Payer: Self-pay | Admitting: Internal Medicine

## 2014-02-17 ENCOUNTER — Ambulatory Visit: Payer: Self-pay | Admitting: Internal Medicine

## 2014-02-17 DIAGNOSIS — M5136 Other intervertebral disc degeneration, lumbar region: Secondary | ICD-10-CM | POA: Diagnosis not present

## 2014-02-17 DIAGNOSIS — M4806 Spinal stenosis, lumbar region: Secondary | ICD-10-CM | POA: Diagnosis not present

## 2014-02-17 DIAGNOSIS — M4185 Other forms of scoliosis, thoracolumbar region: Secondary | ICD-10-CM | POA: Diagnosis not present

## 2014-02-18 ENCOUNTER — Ambulatory Visit: Payer: Self-pay | Admitting: Ophthalmology

## 2014-02-18 DIAGNOSIS — Z01812 Encounter for preprocedural laboratory examination: Secondary | ICD-10-CM | POA: Diagnosis not present

## 2014-02-18 DIAGNOSIS — H5212 Myopia, left eye: Secondary | ICD-10-CM | POA: Diagnosis not present

## 2014-02-18 DIAGNOSIS — H269 Unspecified cataract: Secondary | ICD-10-CM | POA: Diagnosis not present

## 2014-02-18 LAB — HEMOGLOBIN: HGB: 11.6 g/dL — ABNORMAL LOW (ref 12.0–16.0)

## 2014-02-21 ENCOUNTER — Telehealth: Payer: Self-pay | Admitting: Internal Medicine

## 2014-02-21 DIAGNOSIS — K2970 Gastritis, unspecified, without bleeding: Secondary | ICD-10-CM | POA: Diagnosis not present

## 2014-02-21 DIAGNOSIS — K273 Acute peptic ulcer, site unspecified, without hemorrhage or perforation: Secondary | ICD-10-CM | POA: Diagnosis not present

## 2014-02-21 NOTE — Telephone Encounter (Signed)
Patient called requesting results on MRI performed on 02/17/14 stated by patient. Patient would also like a referral to pain clinic and PT and OT please advise?

## 2014-02-24 ENCOUNTER — Telehealth: Payer: Self-pay | Admitting: Internal Medicine

## 2014-02-24 DIAGNOSIS — M48061 Spinal stenosis, lumbar region without neurogenic claudication: Secondary | ICD-10-CM

## 2014-02-24 DIAGNOSIS — M4807 Spinal stenosis, lumbosacral region: Secondary | ICD-10-CM

## 2014-02-24 NOTE — Telephone Encounter (Signed)
She has severe spinal stenosis at multiple levels due to degenerative changes .  PT referral is in process    Pain clinic referral can proceed now that the MRI has been done

## 2014-02-24 NOTE — Telephone Encounter (Signed)
This patient is not coming up as mine, pleaes have the front desk assign her to me so I can find her more easily  MRI of lumbar spine shows severe spinal stenosis at multiple levels due to degenerative changes which have led to bone spurring.  The Pain Clinic referral is in process, and PT will be ordered.   [name] will need a copy of the MRI report to forward to the Pain Clinic

## 2014-02-24 NOTE — Telephone Encounter (Signed)
Left detailed message per DPR  

## 2014-02-25 ENCOUNTER — Ambulatory Visit (INDEPENDENT_AMBULATORY_CARE_PROVIDER_SITE_OTHER): Payer: Medicare Other | Admitting: Internal Medicine

## 2014-02-25 ENCOUNTER — Encounter: Payer: Self-pay | Admitting: Internal Medicine

## 2014-02-25 VITALS — BP 98/70 | HR 59 | Ht 68.0 in | Wt 119.5 lb

## 2014-02-25 DIAGNOSIS — Z01812 Encounter for preprocedural laboratory examination: Secondary | ICD-10-CM | POA: Diagnosis not present

## 2014-02-25 DIAGNOSIS — I447 Left bundle-branch block, unspecified: Secondary | ICD-10-CM | POA: Diagnosis not present

## 2014-02-25 DIAGNOSIS — I429 Cardiomyopathy, unspecified: Secondary | ICD-10-CM | POA: Diagnosis not present

## 2014-02-25 NOTE — Progress Notes (Signed)
Patient Care Team: Abigail Miyamoto, MD as PCP - General (Family Medicine)   HPI  [name] is a 73 y.o. 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  She also had tracings in the hospital with the operating QRS in lead V1 suggesting alternating bundle branch block; however those times, the limb leads still demonstrated left bundle branch block. She underwent Myoview scanning. This identified fixed defects Although Dr. Gaylyn Cheers thinks there may be ischemic heart disease. However, given her recent diagnosis noted below, medical therapy has been elected.  She's also been diagnosed as having bladder cancer remotely and now recurrently. it is characterized as being aggressive; she's had two porcedures and they have been largely curative  And the family is thrilled  She has intercurrently stopped smoking. She still has significant problems however with shortness of breath at less than 100 feet. She does not have peripheral edema.   Past Medical History  Diagnosis Date  . Ectopic atrial beats   . Bladder cancer 1995  . GERD (gastroesophageal reflux disease)     h/o ulcers  . COPD (chronic obstructive pulmonary disease)   . LBBB (left bundle branch block)     Past Surgical History  Procedure Laterality Date  . Esophagogastroduodenoscopy N/A 11/28/2013    Procedure: ESOPHAGOGASTRODUODENOSCOPY (EGD);  Surgeon: Arta Silence, MD;  Location: [facility] ENDOSCOPY;  Service: Endoscopy;  Laterality: N/A;  . Back surgery    . Transurethral resection of bladder tumor N/A 12/13/2013    Procedure: TRANSURETHRAL RESECTION OF BLADDER TUMOR (TURBT);  Surgeon: [name], MD;  Location: [facility] ORS;  Service: Urology;  Laterality: N/A;  . Transurethral resection of bladder tumor N/A 01/17/2014    Procedure: TRANSURETHRAL RESECTION OF BLADDER TUMOR (TURBT);  Surgeon: [name], MD;  Location: [facility] ORS;  Service: Urology;  Laterality: N/A;     Current Outpatient Prescriptions  Medication Sig Dispense Refill  . Ferrous Sulfate (SLOW FE PO) Take 1 tablet by mouth 2 (two) times daily. 142 mg twice daily    . HYDROCODONE-ACETAMINOPHEN PO Take 10-350 mg by mouth every 6 (six) hours.    Marland Kitchen lisinopril (PRINIVIL,ZESTRIL) 5 MG tablet Take 5 mg by mouth every morning.     . metoprolol succinate (TOPROL-XL) 25 MG 24 hr tablet Take 25 mg by mouth daily at 12 noon.    . pantoprazole (PROTONIX) 40 MG tablet Take 40 mg by mouth 2 (two) times daily.    . polyethylene glycol (MIRALAX / GLYCOLAX) packet Take 17 g by mouth daily.     No current facility-administered medications for this visit.    No Known Allergies  Review of Systems negative except from HPI and PMH  Physical Exam BP 98/70 mmHg  Pulse 59  Ht 5\' 8"  (1.727 m)  Wt 119 lb 8 oz (54.205 kg)  BMI 18.17 kg/m2 Well developed and well nourished in no acute distress HENT normal E scleral and icterus clear Neck Supple JVP flat; carotids brisk and full cleaer *Regular rate and rhythm, no murmurs gallops or rub Soft with active bowel sounds No clubbing cyanosis  Edema Alert and oriented, grossly normal motor and sensory function Skin Warm and Dry   ECG demonstrates sinus rhythm at 59 Interval 17/14/45 Left bundle branch block  Assessment and  Plan  Cardiomyopathy question mechanism  Congestive heart failure-chronic systolic  COPD/emphysema  Left bundle branch block  Bladder cancer   The patient's cardiomyopathy who is  in the background. Evidence of perfusion defects, catheterization is indicated to exclude coronary artery disease.. I have reviewed this with the family as well as the possibility of false positives.  Her blood pressure limiting up titration of medications.  She would be a candidate for cardiac resynchronization an ICD implantation but this would need to follow evaluation of her coronary artery status. We will proceed with catheterization.  We  have discussed the potential benefits and risks of both catheterization as well as CRT-D implantation  Have reviewed the potential benefits and risks of ICD implantation including but not limited to death, perforation of heart or lung, lead dislodgement, infection,  device malfunction and inappropriate shocks.  The patient and family express understanding  and are willing to proceed.     She is euvolemic we will continue current medications.

## 2014-02-25 NOTE — Patient Instructions (Signed)
[facility] Cardiac Cath Instructions   You are scheduled for a Cardiac Cath on:_12/14/15_  Please arrive at _0830_am on the day of your procedure  You will need to pre-register prior to the day of your procedure.  Enter through the [employer] at [facility].  Registration is the first desk on your right.  Please take the procedure order we have given you in order to be registered appropriately  Do not eat/drink anything after midnight  Someone will need to drive you home  It is recommended someone be with you for the first 24 hours after your procedure  Wear clothes that are easy to get on/off and wear slip on shoes if possible   Medications bring a current list of all medications with you  _x__ You may take all of your medications the morning of your procedure with enough water to swallow safely   Day of your procedure: Arrive at the [place] entrance.  Free valet service is available.  After entering the [place] please check-in at the registration desk (1st desk on your right) to receive your armband. After receiving your armband someone will escort you to the cardiac cath/special procedures waiting area.  The usual length of stay after your procedure is about 2 to 3 hours.  This can vary.  If you have any questions, please call our office at [phone], or you may call the cardiac cath lab at [facility] directly at [phone]   Your physician recommends that you return for lab work in:  Take lab order to registration desk to have labs and pre register for procedure

## 2014-02-25 NOTE — Telephone Encounter (Signed)
Left message for patient to call office.  

## 2014-02-26 ENCOUNTER — Other Ambulatory Visit: Payer: Self-pay

## 2014-02-26 ENCOUNTER — Ambulatory Visit: Payer: Self-pay | Admitting: Internal Medicine

## 2014-02-26 DIAGNOSIS — I429 Cardiomyopathy, unspecified: Secondary | ICD-10-CM

## 2014-02-26 DIAGNOSIS — Z01812 Encounter for preprocedural laboratory examination: Secondary | ICD-10-CM

## 2014-02-26 LAB — BASIC METABOLIC PANEL
ANION GAP: 5 — AB (ref 7–16)
BUN: 16 mg/dL (ref 7–18)
CHLORIDE: 104 mmol/L (ref 98–107)
CREATININE: 0.75 mg/dL (ref 0.60–1.30)
Calcium, Total: 9.3 mg/dL (ref 8.5–10.1)
Co2: 29 mmol/L (ref 21–32)
EGFR (African American): >60
EGFR (Non-African Amer.): >60
Glucose: 86 mg/dL (ref 65–99)
OSMOLALITY: 276 (ref 275–301)
Potassium: 4.7 mmol/L (ref 3.5–5.1)
Sodium: 138 mmol/L (ref 136–145)

## 2014-02-26 LAB — CBC WITH DIFFERENTIAL/PLATELET
Basophil #: 0 10*3/uL (ref 0.0–0.1)
Basophil %: 0.5 %
Eosinophil #: 0.1 10*3/uL (ref 0.0–0.7)
Eosinophil %: 2.1 %
HCT: 38.2 % (ref 35.0–47.0)
HGB: 12.1 g/dL (ref 12.0–16.0)
Lymphocyte #: 2 10*3/uL (ref 1.0–3.6)
Lymphocyte %: 32.2 %
MCH: 28.2 pg (ref 26.0–34.0)
MCHC: 31.5 g/dL — AB (ref 32.0–36.0)
MCV: 89 fL (ref 80–100)
MONOS PCT: 10.1 %
Monocyte #: 0.6 x10 3/mm (ref 0.2–0.9)
Neutrophil #: 3.4 10*3/uL (ref 1.4–6.5)
Neutrophil %: 55.1 %
Platelet: 228 10*3/uL (ref 150–440)
RBC: 4.28 10*6/uL (ref 3.80–5.20)
RDW: 17.6 % — AB (ref 11.5–14.5)
WBC: 6.2 10*3/uL (ref 3.6–11.0)

## 2014-02-26 LAB — PROTIME-INR
INR: 0.9
Prothrombin Time: 12 secs (ref 11.5–14.7)

## 2014-02-26 NOTE — Telephone Encounter (Signed)
Patient notified and voiced understanding. Please change primary to Dr. Derrel Nip.

## 2014-02-26 NOTE — Telephone Encounter (Signed)
Patient has been updated to Primary as Dr. Derrel Nip.

## 2014-02-27 ENCOUNTER — Ambulatory Visit: Payer: Self-pay | Admitting: Ophthalmology

## 2014-02-27 DIAGNOSIS — I252 Old myocardial infarction: Secondary | ICD-10-CM | POA: Diagnosis not present

## 2014-02-27 DIAGNOSIS — J439 Emphysema, unspecified: Secondary | ICD-10-CM | POA: Diagnosis not present

## 2014-02-27 DIAGNOSIS — I10 Essential (primary) hypertension: Secondary | ICD-10-CM | POA: Diagnosis not present

## 2014-02-27 DIAGNOSIS — I2510 Atherosclerotic heart disease of native coronary artery without angina pectoris: Secondary | ICD-10-CM | POA: Diagnosis not present

## 2014-02-27 DIAGNOSIS — H2512 Age-related nuclear cataract, left eye: Secondary | ICD-10-CM | POA: Diagnosis not present

## 2014-03-03 ENCOUNTER — Ambulatory Visit: Payer: Self-pay | Admitting: Cardiovascular Disease

## 2014-03-03 DIAGNOSIS — J449 Chronic obstructive pulmonary disease, unspecified: Secondary | ICD-10-CM | POA: Diagnosis not present

## 2014-03-03 DIAGNOSIS — Z87891 Personal history of nicotine dependence: Secondary | ICD-10-CM | POA: Diagnosis not present

## 2014-03-03 DIAGNOSIS — I447 Left bundle-branch block, unspecified: Secondary | ICD-10-CM | POA: Diagnosis not present

## 2014-03-03 DIAGNOSIS — Z79899 Other long term (current) drug therapy: Secondary | ICD-10-CM | POA: Diagnosis not present

## 2014-03-03 DIAGNOSIS — I2510 Atherosclerotic heart disease of native coronary artery without angina pectoris: Secondary | ICD-10-CM | POA: Diagnosis not present

## 2014-03-03 DIAGNOSIS — K219 Gastro-esophageal reflux disease without esophagitis: Secondary | ICD-10-CM | POA: Diagnosis not present

## 2014-03-03 DIAGNOSIS — I429 Cardiomyopathy, unspecified: Secondary | ICD-10-CM | POA: Diagnosis not present

## 2014-03-03 DIAGNOSIS — I10 Essential (primary) hypertension: Secondary | ICD-10-CM | POA: Diagnosis not present

## 2014-03-03 DIAGNOSIS — C679 Malignant neoplasm of bladder, unspecified: Secondary | ICD-10-CM | POA: Diagnosis not present

## 2014-03-04 ENCOUNTER — Encounter: Payer: Self-pay | Admitting: Internal Medicine

## 2014-03-12 DIAGNOSIS — C672 Malignant neoplasm of lateral wall of bladder: Secondary | ICD-10-CM | POA: Diagnosis not present

## 2014-03-12 DIAGNOSIS — N390 Urinary tract infection, site not specified: Secondary | ICD-10-CM | POA: Diagnosis not present

## 2014-03-12 DIAGNOSIS — C671 Malignant neoplasm of dome of bladder: Secondary | ICD-10-CM | POA: Diagnosis not present

## 2014-03-18 ENCOUNTER — Other Ambulatory Visit: Payer: Self-pay | Admitting: Urology

## 2014-03-30 ENCOUNTER — Other Ambulatory Visit: Payer: Self-pay | Admitting: Cardiovascular Disease

## 2014-03-31 ENCOUNTER — Ambulatory Visit: Payer: Self-pay | Admitting: Anesthesiology

## 2014-03-31 DIAGNOSIS — M4806 Spinal stenosis, lumbar region: Secondary | ICD-10-CM | POA: Diagnosis not present

## 2014-03-31 DIAGNOSIS — M5126 Other intervertebral disc displacement, lumbar region: Secondary | ICD-10-CM | POA: Diagnosis not present

## 2014-03-31 DIAGNOSIS — G8929 Other chronic pain: Secondary | ICD-10-CM | POA: Diagnosis not present

## 2014-03-31 DIAGNOSIS — M961 Postlaminectomy syndrome, not elsewhere classified: Secondary | ICD-10-CM | POA: Diagnosis not present

## 2014-03-31 DIAGNOSIS — M545 Low back pain: Secondary | ICD-10-CM | POA: Diagnosis not present

## 2014-03-31 DIAGNOSIS — M47896 Other spondylosis, lumbar region: Secondary | ICD-10-CM | POA: Diagnosis not present

## 2014-03-31 DIAGNOSIS — J449 Chronic obstructive pulmonary disease, unspecified: Secondary | ICD-10-CM | POA: Diagnosis not present

## 2014-04-01 ENCOUNTER — Ambulatory Visit: Payer: Medicare Other | Admitting: Internal Medicine

## 2014-04-09 ENCOUNTER — Encounter: Payer: Self-pay | Admitting: Cardiovascular Disease

## 2014-04-09 ENCOUNTER — Encounter (HOSPITAL_COMMUNITY): Payer: Self-pay | Admitting: *Deleted

## 2014-04-15 ENCOUNTER — Encounter: Payer: Self-pay | Admitting: Internal Medicine

## 2014-04-15 ENCOUNTER — Ambulatory Visit: Payer: Medicare Other | Admitting: Internal Medicine

## 2014-04-15 ENCOUNTER — Ambulatory Visit (INDEPENDENT_AMBULATORY_CARE_PROVIDER_SITE_OTHER): Payer: Medicare Other | Admitting: Internal Medicine

## 2014-04-15 ENCOUNTER — Encounter: Payer: Self-pay | Admitting: *Deleted

## 2014-04-15 ENCOUNTER — Telehealth: Payer: Self-pay

## 2014-04-15 VITALS — BP 112/78 | HR 60 | Ht 68.0 in | Wt 121.0 lb

## 2014-04-15 DIAGNOSIS — I447 Left bundle-branch block, unspecified: Secondary | ICD-10-CM

## 2014-04-15 DIAGNOSIS — Z01812 Encounter for preprocedural laboratory examination: Secondary | ICD-10-CM | POA: Diagnosis not present

## 2014-04-15 DIAGNOSIS — I5022 Chronic systolic (congestive) heart failure: Secondary | ICD-10-CM | POA: Diagnosis not present

## 2014-04-15 NOTE — Progress Notes (Signed)
Electrophysiology Office Note   Date:  04/15/2014   ID:  [name], DOB [birth year], MRN [id number]  PCP:  Bianca Mc, MD  Cardiologist:  MA Primary Electrophysiologist:  Bianca Axe, MD    No chief complaint on file.    History of Present Illness: [name] is a 74 y.o. female seen in followup electrophysiology evaluation for cardiomyopathy with ejection fraction 25% and left bundle branch block. She also had tracings in the hospital with the operating QRS in lead V1 suggesting alternating bundle branch block; however those times, the limb leads still demonstrated left bundle branch block. She continued to struggle with dyspnea on exertion and because   Myoview scanning identified fixed defects it was elected Although Dr. Gaylyn Anderson thinks there may be ischemic heart disease with Dx of bladder Cancer to proceed with medical therapy. She's had two porcedures and they have been largely curative And the family is thrilled  It was decided at our last visit that she undergo catheterization.  Records from12/15 reviewed>> nonobstructive disease   She has intercurrently stopped smoking. She still has significant problems however with shortness of breath at less than 100 feet.  She is scheduled to undergo surveillance cystoscopy with intraocular bladder infusion of chemotherapy later this week    Today, she denies symptoms of palpitations, chest pain,   orthopnea, PND, lower extremity edema, claudication, dizziness, presyncope, syncope, bleeding, or neurologic sequela. She is having problems with shortness of breath and fatigue.   The patient is tolerating medications without difficulties and is otherwise without complaint today.    Past Medical History  Diagnosis Date  . Ectopic atrial beats   . GERD (gastroesophageal reflux disease)     h/o ulcers  . COPD (chronic obstructive pulmonary disease)   . LBBB (left bundle branch block)   . Bladder cancer 1995   Past Surgical History   Procedure Laterality Date  . Esophagogastroduodenoscopy N/A 11/28/2013    Procedure: ESOPHAGOGASTRODUODENOSCOPY (EGD);  Surgeon: Bianca Silence, MD;  Location: [facility] ENDOSCOPY;  Service: Endoscopy;  Laterality: N/A;  . Back surgery    . Transurethral resection of bladder tumor N/A 12/13/2013    Procedure: TRANSURETHRAL RESECTION OF BLADDER TUMOR (TURBT);  Surgeon: Bianca Aloe, MD;  Location: [facility] ORS;  Service: Urology;  Laterality: N/A;  . Transurethral resection of bladder tumor N/A 01/17/2014    Procedure: TRANSURETHRAL RESECTION OF BLADDER TUMOR (TURBT);  Surgeon: Bianca Aloe, MD;  Location: [facility] ORS;  Service: Urology;  Laterality: N/A;  . Eye surgery  12-2013, 02-2014     Current Outpatient Prescriptions  Medication Sig Dispense Refill  . HYDROcodone-acetaminophen (NORCO) 10-325 MG per tablet Take 1 tablet by mouth every 6 (six) hours as needed (Pain).    Marland Kitchen lisinopril (PRINIVIL,ZESTRIL) 5 MG tablet Take 5 mg by mouth daily.     . metoprolol succinate (TOPROL-XL) 25 MG 24 hr tablet TAKE 1 TABLET BY MOUTH EVERY DAY 30 tablet 3  . pantoprazole (PROTONIX) 40 MG tablet Take 40 mg by mouth daily.     . polyethylene glycol (MIRALAX / GLYCOLAX) packet Take 17 g by mouth daily as needed for mild constipation.      No current facility-administered medications for this visit.    Allergies:   Review of patient's allergies indicates no known allergies.   Social History:  The patient  reports that she quit smoking about 4 months ago. Her smoking use included Cigarettes. She has a 75 pack-year smoking history. She has never used smokeless  tobacco. She reports that she does not drink alcohol or use illicit drugs.   Family History:  The patient's *family history includes Cancer in her brother and mother.    ROS:  Please see the history of present illness.  .   All other systems are reviewed and negative.    PHYSICAL EXAM: VS:  BP 112/78 mmHg  Pulse 60  Ht 5\' 8"  (1.727 m)  Wt 121 lb  (54.885 kg)  BMI 18.40 kg/m2 , BMI Body mass index is 18.4 kg/(m^2). GEN: Well nourished, well developed, in no acute distress HEENT: normal Neck: JVD 5-6 cm, carotid bruits, or masses Cardiac: R RR; 2/6  murmur  rubs, + S4  Respiratory:  clear to auscultation bilaterally, normal work of breathing Back without kyphosis or CVAT GI: soft, nontender, nondistended, + BS MS: no deformity or atrophy Skin: warm and dry,   Extremities No Clubbing cyanosis  Edema Neuro:  Strength and sensation are intact Psych: euthymic mood, full affect  EKG:  EKG is ordered today. The ekg ordered today shows sinus rhythm at 60 Intervals 18/13/50 LBBB   Recent Labs: 11/22/2013: ALT 14 11/27/2013: TSH 1.700 01/17/2014: BUN 16; Creatinine 0.61; Hemoglobin 11.6*; Platelets 233; Potassium 4.2; Sodium 138    Lipid Panel     Component Value Date/Time   CHOL 175 08/31/2010 1740   TRIG 67 08/31/2010 1740   HDL 52 08/31/2010 1740   CHOLHDL 3.4 08/31/2010 1740   VLDL 13 08/31/2010 1740   LDLCALC 110* 08/31/2010 1740     Wt Readings from Last 3 Encounters:  04/15/14 121 lb (54.885 kg)  02/25/14 119 lb 8 oz (54.205 kg)  02/05/14 120 lb 8 oz (54.658 kg)      Other studies Reviewed: Additional studies/ records that were reviewed today include: cath   Review of the above records today demonstrates: as above   ASSESSMENT AND PLAN:    Cardiomyopathy nonischemic  Congestive heart failure-chronic systolic  COPD/emphysema  Left bundle branch block  Sinus bradycardia-borderline  Bladder cancer   We have had a lengthy discussion again today regarding the role of CRT and the role of the defibrillator we discussed the separate functions i.e. the hope for improved functional capacity as well as some survival benefit with the former and sudden death prevention with the latter. Her cancer is controlled at this time her prognosis is quite good. She would like to proceed with implantation of a CRT-D device  with both functions.      She is on guideline directed medical therapy maximally titrated for her blood pressure. Following device implantation bradycardia We'll no longer be an issue and may allow further up titration. From a heart failure point of view she is euvolemic.      Current medicines are reviewed at length with the patient today.   The patient does not have concerns regarding her medicines.  The following changes were made today:  none  Labs/ tests ordered today include:    Orders Placed This Encounter  Procedures  . EKG 12-Lead     Disposition:   FU with me 3 month(s)   Signed, Bianca Axe, MD  04/15/2014 10:56 AM     [facility] [address] [place] [address] [phone] (office) [phone] (fax)

## 2014-04-15 NOTE — Patient Instructions (Signed)
CRT or cardiac resynchronization therapy is a treatment used to correct heart failure. When you have heart failure your heart is weakened and doesn't pump as well as it should. This therapy may help reduce symptoms and improve the quality of life. Please follow letter given.    * These are general instructions for ICD but should give you an idea   Cardioverter Defibrillator Implantation/CRTD  An implantable cardioverter defibrillator (ICD) is a small, lightweight, battery-powered device that is placed (implanted) under the skin in the chest or abdomen. Your caregiver may prescribe an ICD if:  You have had an irregular heart rhythm (arrhythmia) that originated in the lower chambers of the heart (ventricles).  Your heart has been damaged by a disease (such as coronary artery disease) or heart condition (such as a heart attack). An ICD consists of a battery that lasts several years, a small computer called a pulse generator, and wires called leads that go into the heart. It is used to detect and correct two dangerous arrhythmias: a rapid heart rhythm (tachycardia) and an arrhythmia in which the ventricles contract in an uncoordinated way (fibrillation). When an ICD detects tachycardia, it sends an electrical signal to the heart that restores the heartbeat to normal (cardioversion). This signal is usually painless. If cardioversion does not work or if the ICD detects fibrillation, it delivers a small electrical shock to the heart (defibrillation) to restart the heart. The shock may feel like a strong jolt in the chest.ICDs may be programmed to correct other problems. Sometimes, ICDs are programmed to act as another type of implantable device called a pacemaker. Pacemakers are used to treat a slow heartbeat (bradycardia). LET YOUR CAREGIVER KNOW ABOUT:  Any allergies you have.  All medicines you are taking, including vitamins, herbs, eyedrops, and over-the-counter medicines and creams.  Previous  problems you or members of your family have had with the use of anesthetics.  Any blood disorders you have had.   Other health problems you have. RISKS AND COMPLICATIONS Generally, the procedure to implant an ICD is safe. However, as with any surgical procedure, complications can occur. Possible complications associated with implanting an ICD include:  Swelling, bleeding, or bruising at the site where the ICD was implanted.  Infection at the site where the ICD was implanted.  A reaction to medicine used during the procedure.  Nerve, heart, or blood vessel damage.  Blood clots. BEFORE THE PROCEDURE  You may need to have blood tests, heart tests, or a chest X-ray done before the day of the procedure.  Ask your caregiver about changing or stopping your regular medicines.  Make plans to have someone drive you home. You may need to stay in the hospital overnight after the procedure.  Stop smoking at least 24 hours before the procedure.  Take a bath or shower the night before the procedure. You may need to scrub your chest or abdomen with a special type of soap.  Do not eat or drink before your procedure for as long as directed by your caregiver. Ask if it is okay to take any needed medicine with a small sip of water. PROCEDURE  The procedure to implant an ICD in your chest or abdomen is usually done at a hospital in a room that has a large X-ray machine called a fluoroscope. The machine will be above you during the procedure. It will help your caregiver see your heart during the procedure. Implanting an ICD usually takes 1-3 hours. Before the procedure:  Small monitors will be put on your body. They will be used to check your heart, blood pressure, and oxygen level.  A needle will be put into a vein in your hand or arm. This is called an intravenous (IV) access tube. Fluids and medicine will flow directly into your body through the IV tube.  Your chest or abdomen will be cleaned  with a germ-killing (antiseptic) solution. The area may be shaved.  You may be given medicine to help you relax (sedative).  You will be given a medicine called a local anesthetic. This medicine will make the surgical site numb while the ICD is implanted. You will be sleepy but awake during the procedure. After you are numb the procedure will begin. The caregiver will:  Make a small cut (incision). This will make a pocket deep under your skin that will hold the pulse generator.  Guide the leads through a large blood vessel into your heart and attach them to the heart muscles. Depending on the ICD, the leads may go into one ventricle or they may go to both ventricles and into an upper chamber of the heart (atrium).  Test the ICD.  Close the incision with stitches, glue, or staples. AFTER THE PROCEDURE  You may feel pain. Some pain is normal. It may last a few days.  You may stay in a recovery area until the local anesthetic has worn off. Your blood pressure and pulse will be checked often. You will be taken to a room where your heart will be monitored.  A chest X-ray will be taken. This is done to check that the cardioverter defibrillator is in the right place.  You may stay in the hospital overnight.  A slight bump may be seen over the skin where the ICD was placed. Sometimes, it is possible to feel the ICD under the skin. This is normal.  In the months and years afterward, your caregiver will check the device, the leads, and the battery every few months. Eventually, when the battery is low, the ICD will be replaced. Document Released: 11/27/2001 Document Revised: 12/26/2012 Document Reviewed: 03/26/2012 [facility] Patient Information 2015 [place], [place]. This information is not intended to replace advice given to you by your health care provider. Make sure you discuss any questions you have with your health care provider.   Your physician recommends that you return for lab work in:    On the week of 04/30/14 for BMP, CBC and INR

## 2014-04-15 NOTE — Telephone Encounter (Signed)
Lmom to call back and schedule lab work for week of 2/10. Pt did not checkout today at visit.

## 2014-04-16 NOTE — Telephone Encounter (Signed)
Patient returned called yesterday afternoon to schedule labs per checkout 04-15-14.

## 2014-04-18 ENCOUNTER — Encounter (HOSPITAL_COMMUNITY)
Admission: RE | Disposition: A | Discharge status: Home / Self Care | Payer: Self-pay | Source: Ambulatory Visit | Attending: Urology

## 2014-04-18 ENCOUNTER — Ambulatory Visit (HOSPITAL_COMMUNITY): Payer: Medicare Other | Admitting: Certified Registered Nurse Anesthetist

## 2014-04-18 ENCOUNTER — Ambulatory Visit (HOSPITAL_COMMUNITY)
Admission: RE | Admit: 2014-04-18 | Discharge: 2014-04-18 | Disposition: A | Discharge status: Home / Self Care | Payer: Medicare Other | Source: Ambulatory Visit | Attending: Urology | Admitting: Urology

## 2014-04-18 ENCOUNTER — Encounter (HOSPITAL_COMMUNITY): Payer: Self-pay | Admitting: *Deleted

## 2014-04-18 DIAGNOSIS — D649 Anemia, unspecified: Secondary | ICD-10-CM | POA: Diagnosis not present

## 2014-04-18 DIAGNOSIS — C679 Malignant neoplasm of bladder, unspecified: Secondary | ICD-10-CM | POA: Diagnosis not present

## 2014-04-18 DIAGNOSIS — Z87891 Personal history of nicotine dependence: Secondary | ICD-10-CM | POA: Insufficient documentation

## 2014-04-18 DIAGNOSIS — I447 Left bundle-branch block, unspecified: Secondary | ICD-10-CM | POA: Insufficient documentation

## 2014-04-18 DIAGNOSIS — J449 Chronic obstructive pulmonary disease, unspecified: Secondary | ICD-10-CM | POA: Diagnosis not present

## 2014-04-18 DIAGNOSIS — C671 Malignant neoplasm of dome of bladder: Secondary | ICD-10-CM | POA: Diagnosis not present

## 2014-04-18 DIAGNOSIS — K219 Gastro-esophageal reflux disease without esophagitis: Secondary | ICD-10-CM | POA: Diagnosis not present

## 2014-04-18 DIAGNOSIS — C678 Malignant neoplasm of overlapping sites of bladder: Secondary | ICD-10-CM

## 2014-04-18 HISTORY — PX: CYSTOSCOPY WITH RETROGRADE PYELOGRAM, URETEROSCOPY AND STENT PLACEMENT: SHX5789

## 2014-04-18 HISTORY — PX: TRANSURETHRAL RESECTION OF BLADDER TUMOR: SHX2575

## 2014-04-18 LAB — BASIC METABOLIC PANEL
Anion gap: 8 (ref 5–15)
BUN: 17 mg/dL (ref 6–23)
CALCIUM: 9.6 mg/dL (ref 8.4–10.5)
CHLORIDE: 105 mmol/L (ref 96–112)
CO2: 24 mmol/L (ref 19–32)
Creatinine, Ser: 0.83 mg/dL (ref 0.50–1.10)
GFR calc non Af Amer: 67 mL/min — ABNORMAL LOW (ref >90)
GFR, EST AFRICAN AMERICAN: 78 mL/min — AB (ref >90)
GLUCOSE: 91 mg/dL (ref 70–99)
Potassium: 4.6 mmol/L (ref 3.5–5.1)
Sodium: 137 mmol/L (ref 135–145)

## 2014-04-18 LAB — CBC
HEMATOCRIT: 40 % (ref 36.0–46.0)
Hemoglobin: 12.9 g/dL (ref 12.0–15.0)
MCH: 29.4 pg (ref 26.0–34.0)
MCHC: 32.3 g/dL (ref 30.0–36.0)
MCV: 91.1 fL (ref 78.0–100.0)
PLATELETS: 213 10*3/uL (ref 150–400)
RBC: 4.39 MIL/uL (ref 3.87–5.11)
RDW: 14.9 % (ref 11.5–15.5)
WBC: 6.9 10*3/uL (ref 4.0–10.5)

## 2014-04-18 SURGERY — CYSTOURETEROSCOPY, WITH RETROGRADE PYELOGRAM AND STENT INSERTION
Anesthesia: Spinal

## 2014-04-18 MED ORDER — ESMOLOL HCL 10 MG/ML IV SOLN
INTRAVENOUS | Status: AC
  Filled 2014-04-18: qty 10

## 2014-04-18 MED ORDER — SODIUM CHLORIDE 0.9 % IR SOLN
Status: DC | PRN
  Administered 2014-04-18: 18000 mL

## 2014-04-18 MED ORDER — LACTATED RINGERS IV SOLN
INTRAVENOUS | Status: DC
  Administered 2014-04-18: 1000 mL via INTRAVENOUS
  Administered 2014-04-18: 13:00:00 via INTRAVENOUS

## 2014-04-18 MED ORDER — SODIUM CHLORIDE 0.9 % IJ SOLN
INTRAMUSCULAR | Status: AC
  Filled 2014-04-18: qty 10

## 2014-04-18 MED ORDER — MITOMYCIN CHEMO FOR BLADDER INSTILLATION 40 MG
40.0000 mg | Freq: Once | INTRAVENOUS | Status: AC
  Administered 2014-04-18: 40 mg via INTRAVESICAL
  Filled 2014-04-18: qty 40

## 2014-04-18 MED ORDER — ONDANSETRON HCL 4 MG/2ML IJ SOLN
INTRAMUSCULAR | Status: DC | PRN
  Administered 2014-04-18: 4 mg via INTRAVENOUS

## 2014-04-18 MED ORDER — ONDANSETRON HCL 4 MG/2ML IJ SOLN
INTRAMUSCULAR | Status: AC
  Filled 2014-04-18: qty 2

## 2014-04-18 MED ORDER — SULFAMETHOXAZOLE-TRIMETHOPRIM 400-80 MG PO TABS: 1.0000 | ORAL_TABLET | Freq: Two times a day (BID) | ORAL | Status: DC

## 2014-04-18 MED ORDER — PROMETHAZINE HCL 25 MG/ML IJ SOLN: 6.2500 mg | INTRAMUSCULAR | Status: DC | PRN

## 2014-04-18 MED ORDER — BELLADONNA ALKALOIDS-OPIUM 16.2-60 MG RE SUPP
RECTAL | Status: AC
  Filled 2014-04-18: qty 1

## 2014-04-18 MED ORDER — HYDROCODONE-ACETAMINOPHEN 5-325 MG PO TABS
2.0000 | ORAL_TABLET | ORAL | Status: AC
  Administered 2014-04-18: 2 via ORAL
  Filled 2014-04-18: qty 2

## 2014-04-18 MED ORDER — CEFAZOLIN SODIUM-DEXTROSE 2-3 GM-% IV SOLR
INTRAVENOUS | Status: AC
  Filled 2014-04-18: qty 50

## 2014-04-18 MED ORDER — PROPOFOL 10 MG/ML IV BOLUS
INTRAVENOUS | Status: AC
  Filled 2014-04-18: qty 20

## 2014-04-18 MED ORDER — EPHEDRINE SULFATE 50 MG/ML IJ SOLN
INTRAMUSCULAR | Status: AC
  Filled 2014-04-18: qty 1

## 2014-04-18 MED ORDER — MEPERIDINE HCL 50 MG/ML IJ SOLN: 6.2500 mg | INTRAMUSCULAR | Status: DC | PRN

## 2014-04-18 MED ORDER — IOHEXOL 350 MG/ML SOLN
INTRAVENOUS | Status: DC | PRN
  Administered 2014-04-18: 10 mL via URETHRAL

## 2014-04-18 MED ORDER — PROPOFOL INFUSION 10 MG/ML OPTIME
INTRAVENOUS | Status: DC | PRN
  Administered 2014-04-18: 25 ug/kg/min via INTRAVENOUS

## 2014-04-18 MED ORDER — CEFAZOLIN SODIUM-DEXTROSE 2-3 GM-% IV SOLR
2.0000 g | INTRAVENOUS | Status: AC
  Administered 2014-04-18: 2 g via INTRAVENOUS

## 2014-04-18 MED ORDER — PROPOFOL 10 MG/ML IV BOLUS
INTRAVENOUS | Status: DC | PRN
  Administered 2014-04-18 (×2): 20 mg via INTRAVENOUS
  Administered 2014-04-18: 10 mg via INTRAVENOUS

## 2014-04-18 MED ORDER — MIDAZOLAM HCL 5 MG/5ML IJ SOLN
INTRAMUSCULAR | Status: DC | PRN
  Administered 2014-04-18 (×2): 1 mg via INTRAVENOUS

## 2014-04-18 MED ORDER — MIDAZOLAM HCL 2 MG/2ML IJ SOLN
INTRAMUSCULAR | Status: AC
  Filled 2014-04-18: qty 2

## 2014-04-18 MED ORDER — FENTANYL CITRATE 0.05 MG/ML IJ SOLN: 25.0000 ug | INTRAMUSCULAR | Status: DC | PRN

## 2014-04-18 SURGICAL SUPPLY — 38 items
BAG URINE DRAINAGE (UROLOGICAL SUPPLIES) ×1 IMPLANT
BAG URO CATCHER STRL LF (DRAPE) ×3 IMPLANT
BASKET LASER NITINOL 1.9FR (BASKET) IMPLANT
BASKET STNLS GEMINI 4WIRE 3FR (BASKET) IMPLANT
BASKET ZERO TIP NITINOL 2.4FR (BASKET) IMPLANT
BRUSH URET BIOPSY 3F (UROLOGICAL SUPPLIES) IMPLANT
BSKT STON RTRVL 120 1.9FR (BASKET)
BSKT STON RTRVL GEM 120X11 3FR (BASKET)
BSKT STON RTRVL ZERO TP 2.4FR (BASKET)
CATH FOLEY 2WAY SLVR 30CC 16FR (CATHETERS) ×1 IMPLANT
CATH INTERMIT  6FR 70CM (CATHETERS) ×1 IMPLANT
CATH URET 5FR 28IN CONE TIP (BALLOONS) ×1
CATH URET 5FR 70CM CONE TIP (BALLOONS) IMPLANT
CLOTH BEACON ORANGE TIMEOUT ST (SAFETY) ×3 IMPLANT
DRAPE CAMERA CLOSED 9X96 (DRAPES) ×2 IMPLANT
ELECT LOOP 22F BIPOLAR SML (ELECTROSURGICAL) ×3
ELECTRODE LOOP 22F BIPOLAR SML (ELECTROSURGICAL) IMPLANT
FIBER LASER FLEXIVA 1000 (UROLOGICAL SUPPLIES) IMPLANT
FIBER LASER FLEXIVA 200 (UROLOGICAL SUPPLIES) IMPLANT
FIBER LASER FLEXIVA 365 (UROLOGICAL SUPPLIES) IMPLANT
FIBER LASER FLEXIVA 550 (UROLOGICAL SUPPLIES) IMPLANT
FIBER LASER TRAC TIP (UROLOGICAL SUPPLIES) IMPLANT
GLOVE BIOGEL M STRL SZ7.5 (GLOVE) ×12 IMPLANT
GOWN STRL REUS W/TWL XL LVL3 (GOWN DISPOSABLE) ×7 IMPLANT
GUIDEWIRE ANG ZIPWIRE 038X150 (WIRE) IMPLANT
GUIDEWIRE STR DUAL SENSOR (WIRE) IMPLANT
IV NS IRRIG 3000ML ARTHROMATIC (IV SOLUTION) ×4 IMPLANT
KIT BALLIN UROMAX 15FX10 (LABEL) IMPLANT
KIT BALLN UROMAX 15FX4 (MISCELLANEOUS) IMPLANT
KIT BALLN UROMAX 26 75X4 (MISCELLANEOUS)
MANIFOLD NEPTUNE II (INSTRUMENTS) ×3 IMPLANT
PACK CYSTO (CUSTOM PROCEDURE TRAY) ×5 IMPLANT
SET HIGH PRES BAL DIL (LABEL)
SHEATH ACCESS URETERAL 24CM (SHEATH) IMPLANT
SHEATH ACCESS URETERAL 54CM (SHEATH) IMPLANT
SHIELD EYE BINOCULAR (MISCELLANEOUS) IMPLANT
SYRINGE IRR TOOMEY STRL 70CC (SYRINGE) IMPLANT
TUBING CONNECTING 10 (TUBING) ×3 IMPLANT

## 2014-04-18 NOTE — Progress Notes (Signed)
Peri care with warm water while patient is on bedpan after foley catheter is removed. Patient had mitomycin C installation into bladder. Foley and leg bag disposed off in yellow chemo container.

## 2014-04-18 NOTE — Anesthesia Procedure Notes (Signed)
Spinal Patient location during procedure: OR Start time: 04/18/2014 11:55 AM End time: 04/18/2014 12:00 PM Staffing Anesthesiologist: Montez Hageman Resident/CRNA: Darlys Gales R Performed by: resident/CRNA  Preanesthetic Checklist Completed: patient identified, site marked, surgical consent, pre-op evaluation, timeout performed, IV checked, risks and benefits discussed and monitors and equipment checked Spinal Block Patient position: sitting Prep: Betadine Patient monitoring: heart rate, continuous pulse ox and blood pressure Approach: midline Location: L3-4 Injection technique: single-shot Needle Needle type: Spinocan  Needle gauge: 22 G Needle length: 9 cm Needle insertion depth: 7.5 cm Assessment Sensory level: T6 Additional Notes Expiration date of kit checked and confirmed. Patient tolerated procedure well, without complications.

## 2014-04-18 NOTE — Transfer of Care (Signed)
Immediate Anesthesia Transfer of Care Note  Patient: [name]  Procedure(s) Performed: Procedure(s): CYSTOSCOPY WITH RETROGRADE PYELOGRAM (Bilateral) TRANSURETHRAL RESECTION OF BLADDER TUMOR (TURBT), CYSTOGRAM (N/A)  Patient Location: PACU  Anesthesia Type:Regional  Level of Consciousness: awake, alert  and oriented  Airway & Oxygen Therapy: Patient Spontanous Breathing and Patient connected to face mask oxygen  Post-op Assessment: Report given to RN and Post -op Vital signs reviewed and stable  Post vital signs: Reviewed and stable  Last Vitals:  Filed Vitals:   04/18/14 0738  BP: 142/60  Pulse: 56  Temp: 36.8 C  Resp: 18    Complications: No apparent anesthesia complications

## 2014-04-18 NOTE — Anesthesia Postprocedure Evaluation (Signed)
  Anesthesia Post-op Note  Patient: [name]  Procedure(s) Performed: Procedure(s) (LRB): CYSTOSCOPY WITH RETROGRADE PYELOGRAM (Bilateral) TRANSURETHRAL RESECTION OF BLADDER TUMOR (TURBT), CYSTOGRAM (N/A)  Patient Location: PACU  Anesthesia Type: Spinal  Level of Consciousness: awake and alert   Airway and Oxygen Therapy: Patient Spontanous Breathing  Post-op Pain: mild  Post-op Assessment: Post-op Vital signs reviewed, Patient's Cardiovascular Status Stable, Respiratory Function Stable, Patent Airway and No signs of Nausea or vomiting  Last Vitals:  Filed Vitals:   04/18/14 1415  BP: 156/80  Pulse: 51  Temp:   Resp: 14    Post-op Vital Signs: stable   Complications: No apparent anesthesia complications

## 2014-04-18 NOTE — Discharge Instructions (Signed)
Cystoscopy, Care After Refer to this sheet in the next few weeks. These instructions provide you with information on caring for yourself after your procedure. Your caregiver may also give you more specific instructions. Your treatment has been planned according to current medical practices, but problems sometimes occur. Call your caregiver if you have any problems or questions after your procedure. HOME CARE INSTRUCTIONS  Things you can do to ease any discomfort after your procedure include:  Drinking enough water and fluids to keep your urine clear or pale yellow.  Taking a warm bath to relieve any burning feelings. SEEK IMMEDIATE MEDICAL CARE IF:   You have an increase in blood in your urine.  You notice blood clots in your urine.  You have difficulty passing urine.  You have the chills.  You have abdominal pain.  You have a fever or persistent symptoms for more than 2-3 days.  You have a fever and your symptoms suddenly get worse. MAKE SURE YOU:   Understand these instructions.  Will watch your condition.  Will get help right away if you are not doing well or get worse.    Post Anesthesia Home Care Instructions  Activity: Get plenty of rest for the remainder of the day. A responsible adult should stay with you for 24 hours following the procedure.  For the next 24 hours, DO NOT: -Drive a car -Paediatric nurse -Drink alcoholic beverages -Take any medication unless instructed by your physician -Make any legal decisions or sign important papers.  Meals: Start with liquid foods such as gelatin or soup. Progress to regular foods as tolerated. Avoid greasy, spicy, heavy foods. If nausea and/or vomiting occur, drink only clear liquids until the nausea and/or vomiting subsides. Call your physician if vomiting continues.  Special Instructions/Symptoms: Your throat may feel dry or sore from the anesthesia or the breathing tube placed in your throat during surgery. If this  causes discomfort, gargle with warm salt water. The discomfort should disappear within 24 hours.

## 2014-04-18 NOTE — H&P (Signed)
CC: bladder cancer  History of Present Illness: 74 yo with multifocal, large volume HG Ta bladder cancer. She presents for third cysto/bbx/TURBT in hopes of clearing most tumor and confirm staging. It's been almost 5 months since she had upper tract imaging therefore we will proceed with bilateral RGP. Also, we plan mitomycin instillation.   She is well. She started abx a few days ago. She's had no dysuria or gross hematuria. No fever.   Past Medical History  Diagnosis Date  . Ectopic atrial beats   . GERD (gastroesophageal reflux disease)     h/o ulcers  . COPD (chronic obstructive pulmonary disease)   . LBBB (left bundle branch block)   . Bladder cancer 1995   Past Surgical History  Procedure Laterality Date  . Esophagogastroduodenoscopy N/A 11/28/2013    Procedure: ESOPHAGOGASTRODUODENOSCOPY (EGD);  Surgeon: Arta Silence, MD;  Location: [facility] ENDOSCOPY;  Service: Endoscopy;  Laterality: N/A;  . Back surgery    . Transurethral resection of bladder tumor N/A 12/13/2013    Procedure: TRANSURETHRAL RESECTION OF BLADDER TUMOR (TURBT);  Surgeon: [name], MD;  Location: [facility];  Service: Urology;  Laterality: N/A;  . Transurethral resection of bladder tumor N/A 01/17/2014    Procedure: TRANSURETHRAL RESECTION OF BLADDER TUMOR (TURBT);  Surgeon: [name], MD;  Location: [facility];  Service: Urology;  Laterality: N/A;  . Eye surgery  12-2013, 02-2014    Home Medications:  Prescriptions prior to admission  Medication Sig Dispense Refill Last Dose  . HYDROcodone-acetaminophen (NORCO) 10-325 MG per tablet Take 1 tablet by mouth every 6 (six) hours as needed (Pain).   04/17/2014 at 1800  . lisinopril (PRINIVIL,ZESTRIL) 5 MG tablet Take 5 mg by mouth daily.    04/17/2014 at 1700  . metoprolol succinate (TOPROL-XL) 25 MG 24 hr tablet TAKE 1 TABLET BY MOUTH EVERY DAY 30 tablet 3 04/17/2014 at 1200  . pantoprazole (PROTONIX) 40 MG tablet Take 40 mg by mouth daily.    04/17/2014 at 1400  .  polyethylene glycol (MIRALAX / GLYCOLAX) packet Take 17 g by mouth daily as needed for mild constipation.    Past Week at Unknown time   Allergies: No Known Allergies  Family History  Problem Relation Age of Onset  . Cancer Mother   . Cancer Brother    Social History:  reports that she quit smoking about 4 months ago. Her smoking use included Cigarettes. She has a 75 pack-year smoking history. She has never used smokeless tobacco. She reports that she does not drink alcohol or use illicit drugs.  ROS: A complete review of systems was performed.  All systems are negative except for pertinent findings as noted. ROS   Physical Exam:  Vital signs in last 24 hours: Temp:  [98.2 F (36.8 C)] 98.2 F (36.8 C) (01/29 0738) Pulse Rate:  [56] 56 (01/29 0738) Resp:  [18] 18 (01/29 0738) BP: (142)/(60) 142/60 mmHg (01/29 0738) SpO2:  [98 %] 98 % (01/29 0738) Weight:  [53.751 kg (118 lb 8 oz)] 53.751 kg (118 lb 8 oz) (01/29 0740) General:  Alert and oriented, No acute distress HEENT: Normocephalic, atraumatic Cardiovascular: Regular rate and rhythm Lungs: Regular rate and effort Abdomen: Soft, nontender, nondistended, no abdominal masses Back: No CVA tenderness Extremities: No edema Neurologic: Grossly intact  Laboratory Data:  Results for orders placed or performed during the hospital encounter of 04/18/14 (from the past 24 hour(s))  CBC     Status: None   Collection Time: 04/18/14  7:20  AM  Result Value Ref Range   WBC 6.9 4.0 - 10.5 K/uL   RBC 4.39 3.87 - 5.11 MIL/uL   Hemoglobin 12.9 12.0 - 15.0 g/dL   HCT 40.0 36.0 - 46.0 %   MCV 91.1 78.0 - 100.0 fL   MCH 29.4 26.0 - 34.0 pg   MCHC 32.3 30.0 - 36.0 g/dL   RDW 14.9 11.5 - 15.5 %   Platelets 213 150 - 400 K/uL  Basic metabolic panel     Status: Abnormal   Collection Time: 04/18/14  7:20 AM  Result Value Ref Range   Sodium 137 135 - 145 mmol/L   Potassium 4.6 3.5 - 5.1 mmol/L   Chloride 105 96 - 112 mmol/L   CO2 24 19 -  32 mmol/L   Glucose, Bld 91 70 - 99 mg/dL   BUN 17 6 - 23 mg/dL   Creatinine, Ser 0.83 0.50 - 1.10 mg/dL   Calcium 9.6 8.4 - 10.5 mg/dL   GFR calc non Af Amer 67 (L) >90 mL/min   GFR calc Af Amer 78 (L) >90 mL/min   Anion gap 8 5 - 15   No results found for this or any previous visit (from the past 240 hour(s)). Creatinine:  Recent Labs  04/18/14 0720  CREATININE 0.83    Impression/Assessment/plan: I discussed with the patient the nature, potential benefits, risks and alternatives to cysto, TURBT, bilateral RGP and instillation of Trent in PACU including side effects of the proposed treatment, the likelihood of the patient achieving the goals of the procedure, and any potential problems that might occur during the procedure or recuperation. All questions answered. Patient elects to proceed.     Nellene Courtois 04/18/2014, 10:58 AM

## 2014-04-18 NOTE — Op Note (Signed)
Preoperative diagnosis: Bladder cancer Postoperative diagnosis: Bladder cancer  Procedure: Exam under anesthesia, Cystoscopy,  Bilateral retrograde pyelogram,  TURBT greater than 5 cm Cystogram  Surgeon: Junious Silk  Anesthesia: Gen.  Indication for procedure: Patient's a 74 year old with high-grade TA bladder cancer. She was brought back to or an attempt to debulk her tumor to begin BCG in the office.  Findings: On exam under anesthesia there was a hysterectomy done with a blind-ending vagina. There were no palpable masses in the bladder.  On cystoscopy there were multiple bladder tumors scattered throughout the entire bladder. These were small and superficial except there was a 1-2 cm bladder tumor at the right bladder neck and a residual 1-2 cm bladder tumor posterior dome where the large tumor was originally located. And again many other scattered small tumors. All of these tumors appeared high-grade but superficial.  Right retrograde pyelogram-this outlined a single ureter single collecting system unit without filling defect stricture dilation.   Left retropyelogram pyelogram-this outlined a single ureter single collecting system unit without filling defect stricture dilation.  Drainage was brisk incomplete bilaterally.  I was concerned about a possible perforation posteriorly. All of the resection I performed was very superficial and I ablated or coagulated 90% of the tumor center bladder. There were very superficial and would scrape off the mucosa. However in the posterior I thought there may been a small perforation. This possibly happen when the patient was coughing she coughed a lot during the case. To ensure no perforation before I gave mitomycin performed a cystogram.  Cystogram-the bladder was filled with 300 mL of contrast. This outlined a normal bladder contour without extravasation. There was a slight cystocele. There was filling defect from the Foley catheter balloon. Post  drainage films revealed no residual contrast or extravasation.  Mitomycin C in PACU: The bladder was instilled with 40 mg of mitomycin c in 20 mL normal saline which was left indwelling for 50 minutes and then drained.   Description of procedure: After consent was obtained patient brought to the operating room. She is placed in dorsolithotomy after adequate anesthesia. She was prepped and draped in the usual sterile fashion. Exam under anesthesia was performed. Cystoscope was passed per urethra and the bladder carefully examined.   The right ureteral orifice was cannulated with a cone-tipped catheter and retrograde injection of contrast was performed. The left ureteral orifice was cannulated with a cone-tipped catheter and retrograde injection of contrast was performed.  Due to the extent of the tumor it would be much more efficient use the loop and therefore most of the small tumors were scraped off and cauterized but the larger ones required resection and cautery of the base. Some of the larger 1-2 cm tumors with the right bladder neck, dome, posterior and one on the left side. Again these appeared great superficial. Hemostasis was excellent at low-pressure. Specimens were collected. The scope was removed.   A 16 French Foley catheter was placed in the bladder filled with 300 mL Cystografin. Images were obtained. The bladder was drained.  Complications: None Blood loss: Minimal  Specimens: Bladder tumor chips  Drains: 16 French Foley  Disposition: Patient stable to PACU

## 2014-04-18 NOTE — Progress Notes (Signed)
Paged Dr Junious Silk about patient's pain. He states she had multiple tumors all over bladder and he cauterized all of them. Order received for oral pain medication that patient takes at home for chronic back pain.

## 2014-04-18 NOTE — Anesthesia Preprocedure Evaluation (Signed)
Anesthesia Evaluation    Airway Mallampati: II  TM Distance: >3 FB Neck ROM: Full    Dental no notable dental hx.    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%   Neuro/Psych    GI/Hepatic PUD, GERD-  Medicated,  Endo/Other    Renal/GU      Musculoskeletal   Abdominal   Peds  Hematology  (+) anemia ,   Anesthesia Other Findings   Reproductive/Obstetrics                             Anesthesia Physical  Anesthesia Plan  ASA: III  Anesthesia Plan: Spinal   Post-op Pain Management:    Induction:   Airway Management Planned: Simple Face Mask  Additional Equipment:   Intra-op Plan:   Post-operative Plan:   Informed Consent: I have reviewed the patients History and Physical, chart, labs and discussed the procedure including the risks, benefits and alternatives for the proposed anesthesia with the patient or authorized representative who has indicated his/her understanding and acceptance.   Dental advisory given  Plan Discussed with: CRNA  Anesthesia Plan Comments:         Anesthesia Quick Evaluation

## 2014-04-21 ENCOUNTER — Encounter (HOSPITAL_COMMUNITY): Payer: Self-pay | Admitting: Urology

## 2014-04-23 DIAGNOSIS — C678 Malignant neoplasm of overlapping sites of bladder: Secondary | ICD-10-CM | POA: Diagnosis not present

## 2014-04-24 ENCOUNTER — Other Ambulatory Visit: Payer: Medicare Other

## 2014-04-28 ENCOUNTER — Other Ambulatory Visit (INDEPENDENT_AMBULATORY_CARE_PROVIDER_SITE_OTHER): Payer: Medicare Other

## 2014-04-28 ENCOUNTER — Ambulatory Visit: Payer: Self-pay | Admitting: Anesthesiology

## 2014-04-28 DIAGNOSIS — K219 Gastro-esophageal reflux disease without esophagitis: Secondary | ICD-10-CM | POA: Diagnosis not present

## 2014-04-28 DIAGNOSIS — Z87891 Personal history of nicotine dependence: Secondary | ICD-10-CM | POA: Diagnosis not present

## 2014-04-28 DIAGNOSIS — D509 Iron deficiency anemia, unspecified: Secondary | ICD-10-CM | POA: Diagnosis not present

## 2014-04-28 DIAGNOSIS — M542 Cervicalgia: Secondary | ICD-10-CM

## 2014-04-28 DIAGNOSIS — I5022 Chronic systolic (congestive) heart failure: Secondary | ICD-10-CM | POA: Diagnosis not present

## 2014-04-28 DIAGNOSIS — I447 Left bundle-branch block, unspecified: Secondary | ICD-10-CM

## 2014-04-28 DIAGNOSIS — Z01812 Encounter for preprocedural laboratory examination: Secondary | ICD-10-CM

## 2014-04-28 DIAGNOSIS — Z8551 Personal history of malignant neoplasm of bladder: Secondary | ICD-10-CM | POA: Diagnosis not present

## 2014-04-28 DIAGNOSIS — M961 Postlaminectomy syndrome, not elsewhere classified: Secondary | ICD-10-CM | POA: Diagnosis not present

## 2014-04-28 DIAGNOSIS — J449 Chronic obstructive pulmonary disease, unspecified: Secondary | ICD-10-CM | POA: Diagnosis not present

## 2014-04-28 DIAGNOSIS — M545 Low back pain: Secondary | ICD-10-CM | POA: Diagnosis not present

## 2014-04-28 DIAGNOSIS — M4806 Spinal stenosis, lumbar region: Secondary | ICD-10-CM | POA: Diagnosis not present

## 2014-04-28 DIAGNOSIS — I429 Cardiomyopathy, unspecified: Secondary | ICD-10-CM

## 2014-04-29 LAB — CBC WITH DIFFERENTIAL/PLATELET
Basophils Absolute: 0 10*3/uL (ref 0.0–0.2)
Basos: 1 %
Eos: 4 %
Eosinophils Absolute: 0.3 10*3/uL (ref 0.0–0.4)
HCT: 40.7 % (ref 34.0–46.6)
Hemoglobin: 13.4 g/dL (ref 11.1–15.9)
Immature Grans (Abs): 0 10*3/uL (ref 0.0–0.1)
Immature Granulocytes: 0 %
Lymphocytes Absolute: 1.9 10*3/uL (ref 0.7–3.1)
Lymphs: 24 %
MCH: 29.4 pg (ref 26.6–33.0)
MCHC: 32.9 g/dL (ref 31.5–35.7)
MCV: 89 fL (ref 79–97)
MONOCYTES: 6 %
Monocytes Absolute: 0.5 10*3/uL (ref 0.1–0.9)
NEUTROS ABS: 5.3 10*3/uL (ref 1.4–7.0)
Neutrophils Relative %: 65 %
PLATELETS: 256 10*3/uL (ref 150–379)
RBC: 4.56 x10E6/uL (ref 3.77–5.28)
RDW: 14.8 % (ref 12.3–15.4)
WBC: 8 10*3/uL (ref 3.4–10.8)

## 2014-04-29 LAB — BASIC METABOLIC PANEL
BUN/Creatinine Ratio: 18 (ref 11–26)
BUN: 12 mg/dL (ref 8–27)
CO2: 20 mmol/L (ref 18–29)
CREATININE: 0.68 mg/dL (ref 0.57–1.00)
Calcium: 9.6 mg/dL (ref 8.7–10.3)
Chloride: 98 mmol/L (ref 97–108)
GFR calc Af Amer: 99 mL/min/{1.73_m2} (ref >59)
GFR calc non Af Amer: 86 mL/min/{1.73_m2} (ref >59)
Glucose: 92 mg/dL (ref 65–99)
Potassium: 4.6 mmol/L (ref 3.5–5.2)
SODIUM: 134 mmol/L (ref 134–144)

## 2014-04-29 LAB — PROTIME-INR
INR: 1 (ref 0.8–1.2)
PROTHROMBIN TIME: 10.3 s (ref 9.1–12.0)

## 2014-05-02 DIAGNOSIS — H35343 Macular cyst, hole, or pseudohole, bilateral: Secondary | ICD-10-CM | POA: Diagnosis not present

## 2014-05-05 ENCOUNTER — Telehealth: Payer: Self-pay | Admitting: *Deleted

## 2014-05-05 NOTE — Telephone Encounter (Signed)
Pt called stating she has a procedure  coming this week, and wanted to go over medications. Stated to patient that they will do this when she goes in, but she insisted that we do it over the phone too.  She said that was fine.

## 2014-05-06 DIAGNOSIS — Z79899 Other long term (current) drug therapy: Secondary | ICD-10-CM | POA: Diagnosis not present

## 2014-05-06 DIAGNOSIS — I420 Dilated cardiomyopathy: Secondary | ICD-10-CM | POA: Diagnosis not present

## 2014-05-06 DIAGNOSIS — Z8551 Personal history of malignant neoplasm of bladder: Secondary | ICD-10-CM | POA: Diagnosis not present

## 2014-05-06 DIAGNOSIS — K219 Gastro-esophageal reflux disease without esophagitis: Secondary | ICD-10-CM | POA: Diagnosis not present

## 2014-05-06 DIAGNOSIS — Z87891 Personal history of nicotine dependence: Secondary | ICD-10-CM | POA: Diagnosis not present

## 2014-05-06 DIAGNOSIS — J449 Chronic obstructive pulmonary disease, unspecified: Secondary | ICD-10-CM | POA: Diagnosis not present

## 2014-05-06 DIAGNOSIS — I5022 Chronic systolic (congestive) heart failure: Secondary | ICD-10-CM | POA: Diagnosis not present

## 2014-05-06 DIAGNOSIS — I447 Left bundle-branch block, unspecified: Secondary | ICD-10-CM | POA: Diagnosis not present

## 2014-05-06 MED ORDER — CEFAZOLIN SODIUM-DEXTROSE 2-3 GM-% IV SOLR: 2.0000 g | INTRAVENOUS | Status: DC

## 2014-05-06 MED ORDER — MUPIROCIN 2 % EX OINT
1.0000 "application " | TOPICAL_OINTMENT | Freq: Once | CUTANEOUS | Status: AC
  Administered 2014-05-07: 1 via TOPICAL
  Filled 2014-05-06: qty 22

## 2014-05-06 MED ORDER — SODIUM CHLORIDE 0.9 % IR SOLN
80.0000 mg | Status: DC
  Filled 2014-05-06: qty 2

## 2014-05-06 MED ORDER — SODIUM CHLORIDE 0.9 % IV SOLN: INTRAVENOUS | Status: DC

## 2014-05-06 MED ORDER — SODIUM CHLORIDE 0.9 % IV SOLN
INTRAVENOUS | Status: DC
  Administered 2014-05-07 (×2): via INTRAVENOUS

## 2014-05-07 ENCOUNTER — Encounter (HOSPITAL_COMMUNITY): Payer: Self-pay | Admitting: General Practice

## 2014-05-07 ENCOUNTER — Encounter (HOSPITAL_COMMUNITY)
Admission: RE | Disposition: A | Discharge status: Home / Self Care | Payer: Self-pay | Source: Ambulatory Visit | Attending: Internal Medicine

## 2014-05-07 ENCOUNTER — Ambulatory Visit (HOSPITAL_COMMUNITY)
Admission: RE | Admit: 2014-05-07 | Discharge: 2014-05-08 | Disposition: A | Discharge status: Home / Self Care | Payer: Medicare Other | Source: Ambulatory Visit | Attending: Internal Medicine | Admitting: Internal Medicine

## 2014-05-07 DIAGNOSIS — I447 Left bundle-branch block, unspecified: Secondary | ICD-10-CM | POA: Diagnosis present

## 2014-05-07 DIAGNOSIS — I429 Cardiomyopathy, unspecified: Secondary | ICD-10-CM | POA: Diagnosis not present

## 2014-05-07 DIAGNOSIS — K219 Gastro-esophageal reflux disease without esophagitis: Secondary | ICD-10-CM | POA: Diagnosis not present

## 2014-05-07 DIAGNOSIS — Z8551 Personal history of malignant neoplasm of bladder: Secondary | ICD-10-CM | POA: Diagnosis not present

## 2014-05-07 DIAGNOSIS — Z79899 Other long term (current) drug therapy: Secondary | ICD-10-CM | POA: Insufficient documentation

## 2014-05-07 DIAGNOSIS — Z87891 Personal history of nicotine dependence: Secondary | ICD-10-CM | POA: Insufficient documentation

## 2014-05-07 DIAGNOSIS — I5022 Chronic systolic (congestive) heart failure: Secondary | ICD-10-CM | POA: Diagnosis not present

## 2014-05-07 DIAGNOSIS — I420 Dilated cardiomyopathy: Secondary | ICD-10-CM | POA: Diagnosis not present

## 2014-05-07 DIAGNOSIS — J449 Chronic obstructive pulmonary disease, unspecified: Secondary | ICD-10-CM | POA: Diagnosis not present

## 2014-05-07 DIAGNOSIS — Z959 Presence of cardiac and vascular implant and graft, unspecified: Secondary | ICD-10-CM

## 2014-05-07 DIAGNOSIS — I509 Heart failure, unspecified: Secondary | ICD-10-CM | POA: Diagnosis not present

## 2014-05-07 DIAGNOSIS — Z01812 Encounter for preprocedural laboratory examination: Secondary | ICD-10-CM

## 2014-05-07 HISTORY — DX: Presence of automatic (implantable) cardiac defibrillator: Z95.810

## 2014-05-07 HISTORY — DX: Essential (primary) hypertension: I10

## 2014-05-07 HISTORY — PX: BI-VENTRICULAR IMPLANTABLE CARDIOVERTER DEFIBRILLATOR: SHX5459

## 2014-05-07 HISTORY — DX: Other chronic pain: G89.29

## 2014-05-07 HISTORY — DX: Unspecified osteoarthritis, unspecified site: M19.90

## 2014-05-07 HISTORY — DX: Low back pain: M54.5

## 2014-05-07 HISTORY — DX: Personal history of other diseases of the digestive system: Z87.19

## 2014-05-07 LAB — SURGICAL PCR SCREEN
MRSA, PCR: NEGATIVE
Staphylococcus aureus: NEGATIVE

## 2014-05-07 SURGERY — BI-VENTRICULAR IMPLANTABLE CARDIOVERTER DEFIBRILLATOR  (CRT-D)
Anesthesia: LOCAL

## 2014-05-07 MED ORDER — ACETAMINOPHEN 325 MG PO TABS: 325.0000 mg | ORAL_TABLET | ORAL | Status: DC | PRN

## 2014-05-07 MED ORDER — HYDROCODONE-ACETAMINOPHEN 5-325 MG PO TABS
1.0000 | ORAL_TABLET | Freq: Four times a day (QID) | ORAL | Status: DC | PRN
  Administered 2014-05-07 – 2014-05-08 (×4): 2 via ORAL
  Filled 2014-05-07 (×3): qty 2

## 2014-05-07 MED ORDER — PANTOPRAZOLE SODIUM 40 MG PO TBEC
40.0000 mg | DELAYED_RELEASE_TABLET | Freq: Every day | ORAL | Status: DC
  Administered 2014-05-07 – 2014-05-08 (×2): 40 mg via ORAL
  Filled 2014-05-07 (×2): qty 1

## 2014-05-07 MED ORDER — FENTANYL CITRATE 0.05 MG/ML IJ SOLN
INTRAMUSCULAR | Status: AC
  Filled 2014-05-07: qty 2

## 2014-05-07 MED ORDER — LIDOCAINE HCL (PF) 1 % IJ SOLN
INTRAMUSCULAR | Status: AC
  Filled 2014-05-07: qty 30

## 2014-05-07 MED ORDER — CHLORHEXIDINE GLUCONATE 4 % EX LIQD
60.0000 mL | Freq: Once | CUTANEOUS | Status: DC
  Filled 2014-05-07: qty 60

## 2014-05-07 MED ORDER — HEPARIN (PORCINE) IN NACL 2-0.9 UNIT/ML-% IJ SOLN
INTRAMUSCULAR | Status: AC
  Filled 2014-05-07: qty 500

## 2014-05-07 MED ORDER — ONDANSETRON HCL 4 MG/2ML IJ SOLN: 4.0000 mg | Freq: Four times a day (QID) | INTRAMUSCULAR | Status: DC | PRN

## 2014-05-07 MED ORDER — CEFAZOLIN SODIUM-DEXTROSE 2-3 GM-% IV SOLR
INTRAVENOUS | Status: AC
  Filled 2014-05-07: qty 50

## 2014-05-07 MED ORDER — ENSURE COMPLETE PO LIQD: 237.0000 mL | Freq: Two times a day (BID) | ORAL | Status: DC

## 2014-05-07 MED ORDER — MIDAZOLAM HCL 5 MG/5ML IJ SOLN
INTRAMUSCULAR | Status: AC
  Filled 2014-05-07: qty 5

## 2014-05-07 MED ORDER — SODIUM CHLORIDE 0.9 % IV SOLN: INTRAVENOUS | Status: AC

## 2014-05-07 MED ORDER — POLYETHYLENE GLYCOL 3350 17 G PO PACK
17.0000 g | PACK | Freq: Every day | ORAL | Status: DC | PRN
  Filled 2014-05-07: qty 1

## 2014-05-07 MED ORDER — MUPIROCIN 2 % EX OINT
TOPICAL_OINTMENT | CUTANEOUS | Status: AC
  Administered 2014-05-07: 1 via TOPICAL
  Filled 2014-05-07: qty 22

## 2014-05-07 MED ORDER — METOPROLOL SUCCINATE ER 25 MG PO TB24
25.0000 mg | ORAL_TABLET | Freq: Every day | ORAL | Status: DC
  Administered 2014-05-08: 25 mg via ORAL
  Filled 2014-05-07 (×2): qty 1

## 2014-05-07 MED ORDER — HYDROCODONE-ACETAMINOPHEN 5-325 MG PO TABS
ORAL_TABLET | ORAL | Status: AC
  Filled 2014-05-07: qty 2

## 2014-05-07 MED ORDER — CEFAZOLIN SODIUM 1-5 GM-% IV SOLN
1.0000 g | Freq: Four times a day (QID) | INTRAVENOUS | Status: AC
  Administered 2014-05-07 – 2014-05-08 (×3): 1 g via INTRAVENOUS
  Filled 2014-05-07 (×3): qty 50

## 2014-05-07 MED ORDER — LISINOPRIL 5 MG PO TABS
5.0000 mg | ORAL_TABLET | Freq: Every day | ORAL | Status: DC
  Administered 2014-05-08: 5 mg via ORAL
  Filled 2014-05-07 (×2): qty 1

## 2014-05-07 NOTE — Progress Notes (Signed)
Patient arrived from the cath lab. Patient is A/O, VSS, NSR on the monitor, no complaints of pain at this time. POC discussed with the patient and her daughter, both verbalized understanding. Patient currently resting comfortably in bed. afleming RN

## 2014-05-07 NOTE — Progress Notes (Signed)
Sling placed on left arm. Instructions explained to patient. Waiting on tele bed assignment. Resting quietly w/eyes closed.

## 2014-05-07 NOTE — H&P (View-Only) (Signed)
Electrophysiology Office Note   Date:  04/15/2014   ID:  [name], DOB 1940/09/24, MRN [id number]  PCP:  [name], MD  Cardiologist:  MA Primary Electrophysiologist:  Virl Axe, MD    No chief complaint on file.    History of Present Illness: [name] is a 74 y.o. female seen in followup electrophysiology evaluation for cardiomyopathy with ejection fraction 25% and left bundle branch block. She also had tracings in the hospital with the operating QRS in lead V1 suggesting alternating bundle branch block; however those times, the limb leads still demonstrated left bundle branch block. She continued to struggle with dyspnea on exertion and because   Myoview scanning identified fixed defects it was elected Although Dr. Gaylyn Cheers thinks there may be ischemic heart disease with Dx of bladder Cancer to proceed with medical therapy. She's had two porcedures and they have been largely curative And the family is thrilled  It was decided at our last visit that she undergo catheterization.  Records from12/15 reviewed>> nonobstructive disease   She has intercurrently stopped smoking. She still has significant problems however with shortness of breath at less than 100 feet.  She is scheduled to undergo surveillance cystoscopy with intraocular bladder infusion of chemotherapy later this week    Today, she denies symptoms of palpitations, chest pain,   orthopnea, PND, lower extremity edema, claudication, dizziness, presyncope, syncope, bleeding, or neurologic sequela. She is having problems with shortness of breath and fatigue.   The patient is tolerating medications without difficulties and is otherwise without complaint today.    Past Medical History  Diagnosis Date  . Ectopic atrial beats   . GERD (gastroesophageal reflux disease)     h/o ulcers  . COPD (chronic obstructive pulmonary disease)   . LBBB (left bundle branch block)   . Bladder cancer 1995   Past Surgical History   Procedure Laterality Date  . Esophagogastroduodenoscopy N/A 11/28/2013    Procedure: ESOPHAGOGASTRODUODENOSCOPY (EGD);  Surgeon: Arta Silence, MD;  Location: [facility] ENDOSCOPY;  Service: Endoscopy;  Laterality: N/A;  . Back surgery    . Transurethral resection of bladder tumor N/A 12/13/2013    Procedure: TRANSURETHRAL RESECTION OF BLADDER TUMOR (TURBT);  Surgeon: [name], MD;  Location: [facility];  Service: Urology;  Laterality: N/A;  . Transurethral resection of bladder tumor N/A 01/17/2014    Procedure: TRANSURETHRAL RESECTION OF BLADDER TUMOR (TURBT);  Surgeon: [name], MD;  Location: [facility];  Service: Urology;  Laterality: N/A;  . Eye surgery  12-2013, 02-2014     Current Outpatient Prescriptions  Medication Sig Dispense Refill  . HYDROcodone-acetaminophen (NORCO) 10-325 MG per tablet Take 1 tablet by mouth every 6 (six) hours as needed (Pain).    Marland Kitchen lisinopril (PRINIVIL,ZESTRIL) 5 MG tablet Take 5 mg by mouth daily.     . metoprolol succinate (TOPROL-XL) 25 MG 24 hr tablet TAKE 1 TABLET BY MOUTH EVERY DAY 30 tablet 3  . pantoprazole (PROTONIX) 40 MG tablet Take 40 mg by mouth daily.     . polyethylene glycol (MIRALAX / GLYCOLAX) packet Take 17 g by mouth daily as needed for mild constipation.      No current facility-administered medications for this visit.    Allergies:   Review of patient's allergies indicates no known allergies.   Social History:  The patient  reports that she quit smoking about 4 months ago. Her smoking use included Cigarettes. She has a 75 pack-year smoking history. She has never used smokeless  tobacco. She reports that she does not drink alcohol or use illicit drugs.   Family History:  The patient's *family history includes Cancer in her brother and mother.    ROS:  Please see the history of present illness.  .   All other systems are reviewed and negative.    PHYSICAL EXAM: VS:  BP 112/78 mmHg  Pulse 60  Ht 5\' 8"  (1.727 m)  Wt 121 lb  (54.885 kg)  BMI 18.40 kg/m2 , BMI Body mass index is 18.4 kg/(m^2). GEN: Well nourished, well developed, in no acute distress HEENT: normal Neck: JVD 5-6 cm, carotid bruits, or masses Cardiac: R RR; 2/6  murmur  rubs, + S4  Respiratory:  clear to auscultation bilaterally, normal work of breathing Back without kyphosis or CVAT GI: soft, nontender, nondistended, + BS MS: no deformity or atrophy Skin: warm and dry,   Extremities No Clubbing cyanosis  Edema Neuro:  Strength and sensation are intact Psych: euthymic mood, full affect  EKG:  EKG is ordered today. The ekg ordered today shows sinus rhythm at 60 Intervals 18/13/50 LBBB   Recent Labs: 11/22/2013: ALT 14 11/27/2013: TSH 1.700 01/17/2014: BUN 16; Creatinine 0.61; Hemoglobin 11.6*; Platelets 233; Potassium 4.2; Sodium 138    Lipid Panel     Component Value Date/Time   CHOL 175 08/31/2010 1740   TRIG 67 08/31/2010 1740   HDL 52 08/31/2010 1740   CHOLHDL 3.4 08/31/2010 1740   VLDL 13 08/31/2010 1740   LDLCALC 110* 08/31/2010 1740     Wt Readings from Last 3 Encounters:  04/15/14 121 lb (54.885 kg)  02/25/14 119 lb 8 oz (54.205 kg)  02/05/14 120 lb 8 oz (54.658 kg)      Other studies Reviewed: Additional studies/ records that were reviewed today include: cath   Review of the above records today demonstrates: as above   ASSESSMENT AND PLAN:    Cardiomyopathy nonischemic  Congestive heart failure-chronic systolic  COPD/emphysema  Left bundle branch block  Sinus bradycardia-borderline  Bladder cancer   We have had a lengthy discussion again today regarding the role of CRT and the role of the defibrillator we discussed the separate functions i.e. the hope for improved functional capacity as well as some survival benefit with the former and sudden death prevention with the latter. Her cancer is controlled at this time her prognosis is quite good. She would like to proceed with implantation of a CRT-D device  with both functions.      She is on guideline directed medical therapy maximally titrated for her blood pressure. Following device implantation bradycardia We'll no longer be an issue and may allow further up titration. From a heart failure point of view she is euvolemic.      Current medicines are reviewed at length with the patient today.   The patient does not have concerns regarding her medicines.  The following changes were made today:  none  Labs/ tests ordered today include:    Orders Placed This Encounter  Procedures  . EKG 12-Lead     Disposition:   FU with me 3 month(s)   Signed, Virl Axe, MD  04/15/2014 10:56 AM     [facility] [address] [place] [address] [phone] (office) [phone] (fax)

## 2014-05-07 NOTE — Interval H&P Note (Signed)
ICD Criteria  Current LVEF:30% ;Obtained < 1 month ago.  NYHA Functional Classification: Class III  Heart Failure History:  Yes, Duration of heart failure since onset is > 9 months  Non-Ischemic Dilated Cardiomyopathy History:  Yes, timeframe is > 9 months  Atrial Fibrillation/Atrial Flutter:  No.  Ventricular Tachycardia History:  No.  Cardiac Arrest History:  No  History of Syndromes with Risk of Sudden Death:  No.  Previous ICD:  No.  Electrophysiology Study: No.  Prior MI: No.  PPM: No.  OSA:  No  Patient Life Expectancy of >=1 year: Yes.  Anticoagulation Therapy:  Patient is NOT on anticoagulation therapy.   Beta Blocker Therapy:  Yes.   Ace Inhibitor/ARB Therapy:  Yes.History and Physical Interval Note:  05/07/2014 8:04 AM  [name]  has presented today for surgery, with the diagnosis of heart failure, left bundle branch block  The various methods of treatment have been discussed with the patient and family. After consideration of risks, benefits and other options for treatment, the patient has consented to  Procedure(s): BI-VENTRICULAR IMPLANTABLE CARDIOVERTER DEFIBRILLATOR  (CRT-D) (N/A) as a surgical intervention .  The patient's history has been reviewed, patient examined, no change in status, stable for surgery.  I have reviewed the patient's chart and labs.  Questions were answered to the patient's satisfaction.     Virl Axe

## 2014-05-07 NOTE — CV Procedure (Signed)
[name] [id number]  [id number]  Preop OH:KGOV LBBB CHF  Postop Dx same/   Procedure: dUAL CHAMBER ICD W LV lead placement;; contrast venogram   Cx: None   EBL: Minimal   Dictation number [id number]  Virl Axe, MD 05/07/2014 10:23 AM

## 2014-05-07 NOTE — Progress Notes (Signed)
Patient woke up and called out; had a loose BM. States she took miralax yesterday. Patient and sheets cleaned.

## 2014-05-08 ENCOUNTER — Ambulatory Visit (HOSPITAL_COMMUNITY): Payer: Medicare Other

## 2014-05-08 DIAGNOSIS — I5022 Chronic systolic (congestive) heart failure: Secondary | ICD-10-CM | POA: Diagnosis not present

## 2014-05-08 DIAGNOSIS — K219 Gastro-esophageal reflux disease without esophagitis: Secondary | ICD-10-CM | POA: Diagnosis not present

## 2014-05-08 DIAGNOSIS — J449 Chronic obstructive pulmonary disease, unspecified: Secondary | ICD-10-CM | POA: Diagnosis not present

## 2014-05-08 DIAGNOSIS — I429 Cardiomyopathy, unspecified: Secondary | ICD-10-CM

## 2014-05-08 DIAGNOSIS — Z8551 Personal history of malignant neoplasm of bladder: Secondary | ICD-10-CM | POA: Diagnosis not present

## 2014-05-08 DIAGNOSIS — I447 Left bundle-branch block, unspecified: Secondary | ICD-10-CM | POA: Diagnosis not present

## 2014-05-08 DIAGNOSIS — Z4682 Encounter for fitting and adjustment of non-vascular catheter: Secondary | ICD-10-CM | POA: Diagnosis not present

## 2014-05-08 DIAGNOSIS — I420 Dilated cardiomyopathy: Secondary | ICD-10-CM | POA: Diagnosis not present

## 2014-05-08 NOTE — Op Note (Signed)
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  PROCEDURE:  Dual-chamber defibrillator implantation with left ventricular lead placement; contrast venogram.  SURGEON:  Deboraha Sprang, MD, [facility].  PROCEDURE IN DETAIL:  On obtaining informed consent, the patient was brought to electrophysiology laboratory and placed on the fluoroscopic table in supine position.  After routine prep and drape, lidocaine was infiltrated along the prepectoral subclavicular line and incision was made and carried down to layer of the prepectoral fascia using electrocautery and sharp dissection.  A pocket was formed similarly. Hemostasis was obtained.  Thereafter attention was turned to gain access to the extrathoracic left subclavian vein which turned out to be quite difficult.  Ultimately, a venogram identified its course.  We were able to get 3 separate venipunctures without the aspiration of air or puncture of the artery. Guidewires were placed and retained, sequentially 9-French, 9.5-French, and 7-French sheaths were placed which were passed sequentially a Medtronic 6935 62-cm single coil defibrillator lead, serial number [id number], a Medtronic MB2 coronary sinus cannulation catheter and a Medtronic 5076 52-cm active fixation atrial lead, serial number [id number].  Under fluoroscopic guidance, the defibrillator lead was manipulated to the RV apex with bipolar R-wave was 11.8 with a pace impedance of 687,  a threshold 0.5 V at 0.5 milliseconds.  Current threshold was 0.8 mA, there was no diaphragmatic pacing at 10 V, and the current of injury was modest.  This lead was secured to the prepectoral fascia.  We then obtained access to the coronary sinus without difficulty.  A venogram demonstrated a high lateral branch, which was then targeted and a Medtronic 4298 88-cm lead, serial number [id number] was deployed to the point where the distal tip was at the junction between the mid and distal third and both __________.  In this location, diaphragmatic stimulation was seen with the distal tip, but 2-3__________ configuration measured an R-wave of 7.9 with a pace impedance of 807, a threshold of about 3 V at 0.5 milliseconds.  The LV deployment system was removed, and the lead secured to the prepectoral fascia.  The right atrial lead was then deployed to the right atrial appendage where the bipolar P-wave was 4.1 with a pace impedance of 978, a threshold of 0.9 V at 0.5 milliseconds.  Current threshold was 0.9 mA. There was no diaphragmatic pacing at 10 V.  The current of injury was modest.  This lead was secured to the prepectoral fascia.  The leads were then attached to a Medtronic Viva CRT-D device, serial number [id number].  Through the device, P synchronous pacing was identified.  The bipolar P-wave amplitude was 3.4, the pace impedance of 589, a threshold 0.5 at 0.4.  The R-wave was 17.5 with  a pace impedance of 551, threshold 0.5 at 0.4 and the LV lead to coil configuration, impedance was 665 and threshold was 1.0 V at 0.5 milliseconds.  High- voltage impedance was 77 ohms.  The pocket was copiously irrigated with antibiotic containing saline solution.  Hemostasis was assured and the leads and pulse generator were placed in the pocket secured to the prepectoral fascia.  The wound was closed in 3 layers in normal fashion. The wound was washed, dried, and a benzoin Steri-Strip dressing  was applied.  Needle counts, sponge counts, and instrument counts were correct at the end of the procedure according to the staff.  The patient tolerated the procedure without apparent complication.     Deboraha Sprang, MD, [facility]     SCK/MEDQ  D:  05/07/2014  T:  05/08/2014  Job:  [id number]

## 2014-05-08 NOTE — Progress Notes (Signed)
INITIAL NUTRITION ASSESSMENT  DOCUMENTATION CODES Per approved criteria  -Non-severe (moderate) malnutrition in the context of chronic illness -Underweight   INTERVENTION:  Ensure Complete PO BID, each supplement provides 350 kcal and 13 grams of protein  NUTRITION DIAGNOSIS: Malnutrition related to inadequate oral intake as evidenced by mild-moderate depletion of muscle and subcutaneous fat mass.   Goal: Intake to meet >90% of estimated nutrition needs.  Monitor:  PO intake, labs, weight trend.  Reason for Assessment: Malnutrition Screening Tool  74 y.o. female  Admitting Dx: Nonischemic cardiomyopathy with left bundle branch block and CHF  ASSESSMENT: S/P dual chamber defibrillator implantation on 2/17.  Patient reports that her appetite comes and goes. She's been eating very well for the past 3 weeks, but has had no appetite since admission yesterday evening. She only drank the milk and some coffee for breakfast this AM, did not eat any food. She has Ensure at home for when her appetite is poor.  Nutrition Focused Physical Exam:  Subcutaneous Fat:  Orbital Region: WNL Upper Arm Region: moderate depletion Thoracic and Lumbar Region: mild depletion  Muscle:  Temple Region: mild depletion Clavicle Bone Region: moderate depletion Clavicle and Acromion Bone Region: moderate depletion Scapular Bone Region: mild depletion Dorsal Hand: mild depletion Patellar Region: moderate depletion Anterior Thigh Region: moderate depletion Posterior Calf Region: severe depletion  Edema: none   Height: Ht Readings from Last 1 Encounters:  05/07/14 5' 8.75" (1.746 m)    Weight: Wt Readings from Last 1 Encounters:  05/07/14 120 lb (54.432 kg)    Ideal Body Weight: 65.3 kg  % Ideal Body Weight: 83%  Wt Readings from Last 25 Encounters:  05/07/14 120 lb (54.432 kg)  04/18/14 118 lb 8 oz (53.751 kg)  04/15/14 121 lb (54.885 kg)  02/25/14 119 lb 8 oz (54.205 kg)  02/05/14  120 lb 8 oz (54.658 kg)  01/17/14 117 lb 9.5 oz (53.34 kg)  12/31/13 119 lb (53.978 kg)  12/27/13 117 lb 8 oz (53.298 kg)  12/17/13 118 lb 8 oz (53.751 kg)  12/13/13 118 lb 6 oz (53.695 kg)  11/26/13 124 lb 6.4 oz (56.427 kg)    Usual Body Weight: 124 lbs  % Usual Body Weight: 97%  BMI:  Body mass index is 17.86 kg/(m^2). Underweight  Estimated Nutritional Needs: Kcal: 1500-1700 Protein: 70-85 gm Fluid: >/= 1.5 L  Skin: no issues  Diet Order: Diet Heart  EDUCATION NEEDS: -Education needs addressed   Intake/Output Summary (Last 24 hours) at 05/08/14 0838 Last data filed at 05/08/14 0745  Gross per 24 hour  Intake    240 ml  Output    400 ml  Net   -160 ml    Last BM: 2/17   Labs:  No results for input(s): NA, K, CL, CO2, BUN, CREATININE, CALCIUM, MG, PHOS, GLUCOSE in the last 168 hours.  CBG (last 3)  No results for input(s): GLUCAP in the last 72 hours.  Scheduled Meds: . feeding supplement (ENSURE COMPLETE)  237 mL Oral BID BM  . lisinopril  5 mg Oral Daily  . metoprolol succinate  25 mg Oral Daily  . pantoprazole  40 mg Oral Daily    Continuous Infusions:   Past Medical History  Diagnosis Date  . Ectopic atrial beats   . GERD (gastroesophageal reflux disease)     h/o ulcers  . COPD (chronic obstructive pulmonary disease)   . LBBB (left bundle branch block)   . AICD (automatic cardioverter/defibrillator) present   .  Hypertension   . CHF (congestive heart failure)   . Anemia   . History of stomach ulcers 11/2013  . Migraines     "stopped before menopause"  . Arthritis     "in about all my joints; for sure in my back"  . Chronic lower back pain   . Bladder cancer 1995; 2015    Past Surgical History  Procedure Laterality Date  . Esophagogastroduodenoscopy N/A 11/28/2013    Procedure: ESOPHAGOGASTRODUODENOSCOPY (EGD);  Surgeon: Arta Silence, MD;  [facility] ENDOSCOPY;  Service: Endoscopy;  Laterality: N/A;  . Back surgery    .  Transurethral resection of bladder tumor N/A 12/13/2013    Procedure: TRANSURETHRAL RESECTION OF BLADDER TUMOR (TURBT);  Surgeon: [name], MD;  Location: [facility] ORS;  Service: Urology;  Laterality: N/A;  . Transurethral resection of bladder tumor N/A 01/17/2014    Procedure: TRANSURETHRAL RESECTION OF BLADDER TUMOR (TURBT);  Surgeon: [name], MD;  Location: [facility] ORS;  Service: Urology;  Laterality: N/A;  . Cataract extraction w/ intraocular lens  implant, bilateral Bilateral 12-2013 - [id number]  . Cystoscopy with retrograde pyelogram, ureteroscopy and stent placement Bilateral 04/18/2014    Procedure: CYSTOSCOPY WITH RETROGRADE PYELOGRAM;  Surgeon: [name], MD;  Location: [facility] ORS;  Service: Urology;  Laterality: Bilateral;  . Transurethral resection of bladder tumor N/A 04/18/2014    Procedure: TRANSURETHRAL RESECTION OF BLADDER TUMOR (TURBT), CYSTOGRAM;  Surgeon: [name], MD;  Location: [facility] ORS;  Service: Urology;  Laterality: N/A;  . Cardiac defibrillator placement  05/07/2014    dual chamber  . Lumbar disc surgery  1980's    "ruptured disc"  . Cardiac catheterization  ~ 02/2014  . Bi-ventricular implantable cardioverter defibrillator N/A 05/07/2014    Procedure: BI-VENTRICULAR IMPLANTABLE CARDIOVERTER DEFIBRILLATOR  (CRT-D);  Surgeon: Deboraha Sprang, MD;  Location: [facility] CATH LAB;  Service: Cardiovascular;  Laterality: N/A;    Molli Barrows, RD, LDN, [place] Pager [phone] After Hours Pager [phone]

## 2014-05-08 NOTE — Discharge Summary (Signed)
ELECTROPHYSIOLOGY PROCEDURE DISCHARGE SUMMARY    Patient ID: [name],  MRN: [id number], DOB/AGE: 1940/09/28 74 y.o.  Admit date: 05/07/2014 Discharge date: 05/08/2014  Primary Care Physician: [name], MD Primary Cardiologist: Fletcher Anon Electrophysiologist: Caryl Comes  Primary Discharge Diagnosis:  Non ischemic cardiomyopathy, LBBB, and congestive heart failure status post CRTD implantation this admission  Secondary Discharge Diagnosis:  1.  Bladder cancer 2.  COPD 3.  GERD  No Known Allergies   Procedures This Admission:  1.  Implantation of a MDT CRTD on 05-07-14 by Dr Caryl Comes.  The patient received a MDT model number Viva ICD with model number 5076 right atrial lead, 0017 right ventricular lead, and 4298 left ventricular lead.  DFT's were deferred at time of implant.  There were no immediate post procedure complications. 2.  CXR on 05-08-14 demonstrated no pneumothorax status post device implantation.   Brief HPI: [name] is a 74 y.o. female was referred to electrophysiology in the outpatient setting for consideration of CRTD implantation.  Past medical history includes non-ischemic cardiomyopathy, LBBB, and congestive heart failure.  The patient has persistent LV dysfunction despite guideline directed therapy.  Risks, benefits, and alternatives to CRTD implantation were reviewed with the patient who wished to proceed.   Hospital Course:  The patient was admitted and underwent implantation of a MDT CRTD with details as outlined above.   She was monitored on telemetry overnight which demonstrated AV pacing.  Left chest was without hematoma, slight ecchymosis.  The device was interrogated and found to be functioning normally.  CXR was obtained and demonstrated no pneumothorax status post device implantation.  Wound care, arm mobility, and restrictions were reviewed with the patient.  Dr Caryl Comes examined the patient and considered them stable for discharge to home.   The  patient's discharge medications include an ACE-I (Lisinopril) and beta blocker (Metoprolol).   Discharge Vitals: Blood pressure 126/67, pulse 62, temperature 98.3 F (36.8 C), temperature source Oral, resp. rate 18, height 5' 8.75" (1.746 m), weight 120 lb (54.432 kg), SpO2 99 %.   Well developed and nourished in no acute distress HENT normal Neck supple with JVP-flat Clear Pocket without hematoma Regular rate and rhythm, no murmurs or gallops Abd-soft with active BS No Clubbing cyanosis edema Skin-warm and dry A & Oriented  Grossly normal sensory and motor function  Labs:   Lab Results  Component Value Date   WBC 8.0 04/28/2014   HGB 13.4 04/28/2014   HCT 40.7 04/28/2014   MCV 89 04/28/2014   PLT 256 04/28/2014   No results for input(s): NA, K, CL, CO2, BUN, CREATININE, CALCIUM, PROT, BILITOT, ALKPHOS, ALT, AST, GLUCOSE in the last 168 hours.  Invalid input(s): LABALBU   Discharge Medications:    Medication List    ASK your doctor about these medications        HYDROcodone-acetaminophen 10-325 MG per tablet  Commonly known as:  NORCO  Take 1 tablet by mouth every 6 (six) hours as needed (Pain).     lisinopril 5 MG tablet  Commonly known as:  PRINIVIL,ZESTRIL  Take 5 mg by mouth daily.     metoprolol succinate 25 MG 24 hr tablet  Commonly known as:  TOPROL-XL  TAKE 1 TABLET BY MOUTH EVERY DAY     pantoprazole 40 MG tablet  Commonly known as:  PROTONIX  Take 40 mg by mouth daily.     polyethylene glycol packet  Commonly known as:  MIRALAX / GLYCOLAX  Take  17 g by mouth daily as needed for mild constipation.     sulfamethoxazole-trimethoprim 400-80 MG per tablet  Commonly known as:  BACTRIM  Take 1 tablet by mouth 2 (two) times daily.        Disposition:  Home  Will seeinCHF followup in 6 weeks with wound check in 10 days    Duration of Discharge Encounter: Greater than 30 minutes including physician time.  Signed,

## 2014-05-08 NOTE — Discharge Instructions (Signed)
° ° °  Supplemental Discharge Instructions for  Pacemaker/Defibrillator Patients  Activity No heavy lifting or vigorous activity with your left/right arm for 6 to 8 weeks.  Do not raise your left/right arm above your head for one week.  Gradually raise your affected arm as drawn below.           __2/20                         2/21                       2/22                           2/23  NO DRIVING for until seen by Cardiologist  .  [place] the wound area clean and dry.  Do not get this area wet for one week. No showers for one week; you may shower on   05/15/2014  . - The tape/steri-strips on your wound will fall off; do not pull them off.  No bandage is needed on the site.  DO  NOT apply any creams, oils, or ointments to the wound area. - If you notice any drainage or discharge from the wound, any swelling or bruising at the site, or you develop a fever > 101? F after you are discharged home, call the office at once.  Special Instructions - You are still able to use cellular telephones; use the ear opposite the side where you have your pacemaker/defibrillator.  Avoid carrying your cellular phone near your device. - When traveling through airports, show security personnel your identification card to avoid being screened in the metal detectors.  Ask the security personnel to use the hand wand. - Avoid arc welding equipment, MRI testing (magnetic resonance imaging), TENS units (transcutaneous nerve stimulators).  Call the office for questions about other devices. - Avoid electrical appliances that are in poor condition or are not properly grounded. - Microwave ovens are safe to be near or to operate.  Additional information for defibrillator patients should your device go off: - If your device goes off ONCE and you feel fine afterward, notify the device clinic nurses. - If your device goes off ONCE and you do not feel well afterward, call 911. - If your device goes off TWICE, call  911. - If your device goes off THREE times in one day, call 911.  DO NOT DRIVE YOURSELF OR A FAMILY MEMBER WITH A DEFIBRILLATOR TO THE HOSPITAL--CALL 911.

## 2014-05-13 DIAGNOSIS — C674 Malignant neoplasm of posterior wall of bladder: Secondary | ICD-10-CM | POA: Diagnosis not present

## 2014-05-13 DIAGNOSIS — C675 Malignant neoplasm of bladder neck: Secondary | ICD-10-CM | POA: Diagnosis not present

## 2014-05-13 DIAGNOSIS — Z5111 Encounter for antineoplastic chemotherapy: Secondary | ICD-10-CM | POA: Diagnosis not present

## 2014-05-19 ENCOUNTER — Telehealth: Payer: Self-pay | Admitting: Internal Medicine

## 2014-05-19 NOTE — Telephone Encounter (Signed)
Patients daughter call Mervin Kung and stated she is concerned about patient having depression that is interferring with patient keeping and scheduling appointments.

## 2014-05-19 NOTE — Telephone Encounter (Signed)
Please offer her an appt 11:30 thursday

## 2014-05-19 NOTE — Telephone Encounter (Signed)
Patient daughter notified which is DPR for patient , she would l;ike to notify patient first and then call back and confirm appointment.

## 2014-05-20 DIAGNOSIS — Z5111 Encounter for antineoplastic chemotherapy: Secondary | ICD-10-CM | POA: Diagnosis not present

## 2014-05-20 DIAGNOSIS — C674 Malignant neoplasm of posterior wall of bladder: Secondary | ICD-10-CM | POA: Diagnosis not present

## 2014-05-20 NOTE — Telephone Encounter (Signed)
FYI patient refused appointment.

## 2014-05-21 ENCOUNTER — Ambulatory Visit (INDEPENDENT_AMBULATORY_CARE_PROVIDER_SITE_OTHER): Payer: Medicare Other | Admitting: *Deleted

## 2014-05-21 DIAGNOSIS — I420 Dilated cardiomyopathy: Secondary | ICD-10-CM

## 2014-05-21 DIAGNOSIS — I5022 Chronic systolic (congestive) heart failure: Secondary | ICD-10-CM

## 2014-05-21 LAB — MDC_IDC_ENUM_SESS_TYPE_INCLINIC
Battery Remaining Longevity: 93 mo
Battery Voltage: 3.07 V
Brady Statistic AP VP Percent: 39.31 %
Brady Statistic RA Percent Paced: 40 %
Brady Statistic RV Percent Paced: 95.2 %
Date Time Interrogation Session: 20160302110740
HIGH POWER IMPEDANCE MEASURED VALUE: 228 Ohm
HIGH POWER IMPEDANCE MEASURED VALUE: 68 Ohm
Lead Channel Impedance Value: 456 Ohm
Lead Channel Impedance Value: 513 Ohm
Lead Channel Pacing Threshold Amplitude: 0.5 V
Lead Channel Pacing Threshold Amplitude: 0.75 V
Lead Channel Pacing Threshold Pulse Width: 0.4 ms
Lead Channel Pacing Threshold Pulse Width: 0.4 ms
Lead Channel Sensing Intrinsic Amplitude: 1.75 mV
Lead Channel Sensing Intrinsic Amplitude: 3.75 mV
Lead Channel Setting Sensing Sensitivity: 0.3 mV
MDC IDC MSMT LEADCHNL LV PACING THRESHOLD AMPLITUDE: 0.75 V
MDC IDC MSMT LEADCHNL RV PACING THRESHOLD PULSEWIDTH: 0.4 ms
MDC IDC MSMT LEADCHNL RV SENSING INTR AMPL: 20.25 mV
MDC IDC MSMT LEADCHNL RV SENSING INTR AMPL: 22 mV
MDC IDC SET LEADCHNL LV PACING AMPLITUDE: 2.25 V
MDC IDC SET LEADCHNL LV PACING PULSEWIDTH: 0.4 ms
MDC IDC SET LEADCHNL RA PACING AMPLITUDE: 3.5 V
MDC IDC SET LEADCHNL RV PACING AMPLITUDE: 3.5 V
MDC IDC SET LEADCHNL RV PACING PULSEWIDTH: 0.4 ms
MDC IDC SET ZONE DETECTION INTERVAL: 300 ms
MDC IDC SET ZONE DETECTION INTERVAL: 350 ms
MDC IDC SET ZONE DETECTION INTERVAL: 450 ms
MDC IDC STAT BRADY AP VS PERCENT: 0.7 %
MDC IDC STAT BRADY AS VP PERCENT: 58.76 %
MDC IDC STAT BRADY AS VS PERCENT: 1.24 %
Zone Setting Detection Interval: 250 ms

## 2014-05-21 MED ORDER — METOPROLOL SUCCINATE ER 25 MG PO TB24: 25.0000 mg | ORAL_TABLET | Freq: Every day | ORAL | Status: DC

## 2014-05-21 NOTE — Progress Notes (Signed)
Wound check appointment. Dermabond removed. Wound without redness or edema. Incision edges approximated, wound well healed. Normal device function. Thresholds, sensing, and impedances consistent with implant measurements. Device programmed at 3.5V for extra safety margin until 3 month visit. Histogram distribution appropriate for patient and level of activity. No mode switches or ventricular arrhythmias noted. Patient educated about wound care, arm mobility, lifting restrictions, shock plan. ROV in 3 months with implanting physician. 

## 2014-05-23 DIAGNOSIS — H26493 Other secondary cataract, bilateral: Secondary | ICD-10-CM | POA: Diagnosis not present

## 2014-05-27 DIAGNOSIS — C674 Malignant neoplasm of posterior wall of bladder: Secondary | ICD-10-CM | POA: Diagnosis not present

## 2014-05-27 DIAGNOSIS — Z5111 Encounter for antineoplastic chemotherapy: Secondary | ICD-10-CM | POA: Diagnosis not present

## 2014-05-28 DIAGNOSIS — M5136 Other intervertebral disc degeneration, lumbar region: Secondary | ICD-10-CM | POA: Diagnosis not present

## 2014-05-28 DIAGNOSIS — I10 Essential (primary) hypertension: Secondary | ICD-10-CM | POA: Diagnosis not present

## 2014-05-28 DIAGNOSIS — M4726 Other spondylosis with radiculopathy, lumbar region: Secondary | ICD-10-CM | POA: Diagnosis not present

## 2014-05-28 DIAGNOSIS — M4806 Spinal stenosis, lumbar region: Secondary | ICD-10-CM | POA: Diagnosis not present

## 2014-05-29 ENCOUNTER — Encounter: Payer: Self-pay | Admitting: Internal Medicine

## 2014-06-03 DIAGNOSIS — C674 Malignant neoplasm of posterior wall of bladder: Secondary | ICD-10-CM | POA: Diagnosis not present

## 2014-06-03 DIAGNOSIS — Z5111 Encounter for antineoplastic chemotherapy: Secondary | ICD-10-CM | POA: Diagnosis not present

## 2014-06-10 DIAGNOSIS — C674 Malignant neoplasm of posterior wall of bladder: Secondary | ICD-10-CM | POA: Diagnosis not present

## 2014-06-12 DIAGNOSIS — M47816 Spondylosis without myelopathy or radiculopathy, lumbar region: Secondary | ICD-10-CM | POA: Diagnosis not present

## 2014-06-12 DIAGNOSIS — M5136 Other intervertebral disc degeneration, lumbar region: Secondary | ICD-10-CM | POA: Diagnosis not present

## 2014-06-12 DIAGNOSIS — M4806 Spinal stenosis, lumbar region: Secondary | ICD-10-CM | POA: Diagnosis not present

## 2014-06-16 DIAGNOSIS — C674 Malignant neoplasm of posterior wall of bladder: Secondary | ICD-10-CM | POA: Diagnosis not present

## 2014-06-16 DIAGNOSIS — Z5111 Encounter for antineoplastic chemotherapy: Secondary | ICD-10-CM | POA: Diagnosis not present

## 2014-06-19 ENCOUNTER — Ambulatory Visit: Payer: Medicare Other | Admitting: Cardiovascular Disease

## 2014-06-24 ENCOUNTER — Ambulatory Visit (INDEPENDENT_AMBULATORY_CARE_PROVIDER_SITE_OTHER): Payer: Medicare Other | Admitting: Cardiovascular Disease

## 2014-06-24 ENCOUNTER — Encounter: Payer: Self-pay | Admitting: Cardiovascular Disease

## 2014-06-24 VITALS — BP 122/82 | HR 65 | Ht 68.75 in | Wt 119.8 lb

## 2014-06-24 DIAGNOSIS — I491 Atrial premature depolarization: Secondary | ICD-10-CM

## 2014-06-24 DIAGNOSIS — I10 Essential (primary) hypertension: Secondary | ICD-10-CM

## 2014-06-24 DIAGNOSIS — I5022 Chronic systolic (congestive) heart failure: Secondary | ICD-10-CM | POA: Diagnosis not present

## 2014-06-24 DIAGNOSIS — I499 Cardiac arrhythmia, unspecified: Secondary | ICD-10-CM | POA: Diagnosis not present

## 2014-06-24 MED ORDER — SPIRONOLACTONE 25 MG PO TABS: 25.0000 mg | ORAL_TABLET | Freq: Every day | ORAL | Status: DC

## 2014-06-24 NOTE — Patient Instructions (Addendum)
Your physician has recommended you make the following change in your medication:  1. START Spironolactone (Aldactone) 25mg  take one by mouth daily  Your physician recommends that you return for lab work in: [place] ([place])--Friday 07/04/14, lab hours 7:30 AM-5:00 PM  Your physician wants you to follow-up in: 3 MONTHS with Dr Fletcher Anon.  You will receive a reminder letter in the mail two months in advance. If you don't receive a letter, please call our office to schedule the follow-up appointment.

## 2014-06-24 NOTE — Assessment & Plan Note (Signed)
She is doing much better after ICD CRT placement. She is no longer hypotensive which might allow adding spironolactone. This was added at 25 mg once daily. Check basic metabolic profile in one week. I advised her to cut down on potassium intake. Continue small dose Toprol and lisinopril.

## 2014-06-24 NOTE — Progress Notes (Signed)
HPI  [name] is a 74 y.o. female who is here today for a follow-up visit regarding chronic systolic heart failure due to nonischemic cardiomyopathy. She was hospitalized in September 2015 with nausea, vomiting, substernal chest pain and epigastric pain. She was found to have hyponatremia with severe anemia. She was noted to have frequent PVCs and PACs. Echocardiogram showed an ejection fraction of 20% with global hypokinesis, mild mitral regurgitation, mild pulmonary hypertension and small pericardial effusion. She underwent pharmacologic nuclear stress test which showed fixed anterior and inferior defects with no reversibility and ejection fraction of 20%. She was suspected of having nonischemic cardiomyopathy.  Around the same time, she was diagnosed with bladder cancer. This has been treated. 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 She continued to have New York Heart Association class III symptoms . She underwent ICD CRT placement by Dr. Caryl Comes without complications. She is actually feeling better overall and moved back to [place] to live in her house.    No Known Allergies   Current Outpatient Prescriptions on File Prior to Visit  Medication Sig Dispense Refill  . HYDROcodone-acetaminophen (NORCO) 10-325 MG per tablet Take 1 tablet by mouth every 6 (six) hours as needed (Pain).    Marland Kitchen lisinopril (PRINIVIL,ZESTRIL) 5 MG tablet Take 5 mg by mouth daily.     . metoprolol succinate (TOPROL-XL) 25 MG 24 hr tablet Take 1 tablet (25 mg total) by mouth daily. 30 tablet 3  . pantoprazole (PROTONIX) 40 MG tablet Take 40 mg by mouth daily.     . polyethylene glycol (MIRALAX / GLYCOLAX) packet Take 17 g by mouth daily as needed for mild constipation.      No current facility-administered medications on file prior to visit.     Past Medical History  Diagnosis Date  . Ectopic atrial beats     . GERD (gastroesophageal reflux disease)     h/o ulcers  . COPD (chronic obstructive pulmonary disease)   . LBBB (left bundle branch block)   . AICD (automatic cardioverter/defibrillator) present   . Hypertension   . CHF (congestive heart failure)   . Anemia   . History of stomach ulcers 11/2013  . Migraines     "stopped before menopause"  . Arthritis     "in about all my joints; for sure in my back"  . Chronic lower back pain   . Bladder cancer 1995; 2015     Past Surgical History  Procedure Laterality Date  . Esophagogastroduodenoscopy N/A 11/28/2013    Procedure: ESOPHAGOGASTRODUODENOSCOPY (EGD);  Surgeon: Arta Silence, MD;  Location[facility] ENDOSCOPY;  Service: Endoscopy;  Laterality: N/A;  . Back surgery    . Transurethral resection of bladder tumor N/A 12/13/2013    Procedure: TRANSURETHRAL RESECTION OF BLADDER TUMOR (TURBT);  Surgeon: [name], MD;  Location: [facility] ORS;  Service: Urology;  Laterality: N/A;  . Transurethral resection of bladder tumor N/A 01/17/2014    Procedure: TRANSURETHRAL RESECTION OF BLADDER TUMOR (TURBT);  Surgeon: [name], MD;  Location: [facility] ORS;  Service: Urology;  Laterality: N/A;  . Cataract extraction w/ intraocular lens  implant, bilateral Bilateral 12-2013 - [id number]  . Cystoscopy with retrograde pyelogram, ureteroscopy and stent placement Bilateral 04/18/2014    Procedure: CYSTOSCOPY WITH RETROGRADE PYELOGRAM;  Surgeon: [name], MD;  Location: [facility] ORS;  Service: Urology;  Laterality: Bilateral;  . Transurethral resection of bladder tumor N/A 04/18/2014    Procedure: TRANSURETHRAL  RESECTION OF BLADDER TUMOR (TURBT), CYSTOGRAM;  Surgeon: [name], MD;  Location: [facility] ORS;  Service: Urology;  Laterality: N/A;  . Cardiac defibrillator placement  05/07/2014    dual chamber  . Lumbar disc surgery  1980's    "ruptured disc"  . Cardiac catheterization  ~ 02/2014  . Bi-ventricular implantable cardioverter defibrillator N/A 05/07/2014     Procedure: BI-VENTRICULAR IMPLANTABLE CARDIOVERTER DEFIBRILLATOR  (CRT-D);  Surgeon: Deboraha Sprang, MD;  Location: [facility] CATH LAB;  Service: Cardiovascular;  Laterality: N/A;     Family History  Problem Relation Age of Onset  . Cancer Mother   . Cancer Brother      History   Social History  . Marital Status: Widowed    Spouse Name: N/A  . Number of Children: N/A  . Years of Education: N/A   Occupational History  . Not on file.   Social History Main Topics  . Smoking status: Former Smoker -- 1.50 packs/day for 55 years    Types: Cigarettes    Quit date: 11/22/2013  . Smokeless tobacco: Never Used  . Alcohol Use: No  . Drug Use: No  . Sexual Activity: No   Other Topics Concern  . Not on file   Social History Narrative     ROS A 10 point review of system was performed. It is negative other than that mentioned in the history of present illness.   PHYSICAL EXAM   BP 122/82 mmHg  Pulse 65  Ht 5' 8.75" (1.746 m)  Wt 119 lb 12.8 oz (54.341 kg)  BMI 17.83 kg/m2  SpO2 97% Constitutional: She is oriented to person, place, and time. She appears frail. No distress.  HENT: No nasal discharge.  Head: Normocephalic and atraumatic.  Eyes: Pupils are equal and round. No discharge.  Neck: Normal range of motion. Neck supple. No JVD present. No thyromegaly present.  Cardiovascular: Normal rate, regular rhythm, normal heart sounds. Exam reveals no gallop and no friction rub. No murmur heard.  Pulmonary/Chest: Effort normal and breath sounds normal. No stridor. No respiratory distress. She has no wheezes. She has no rales. She exhibits no tenderness.  Abdominal: Soft. Bowel sounds are normal. She exhibits no distension. There is no tenderness. There is no rebound and no guarding.  Musculoskeletal: Normal range of motion. She exhibits no edema and no tenderness.  Neurological: She is alert and oriented to person, place, and time. Coordination normal.  Skin: Skin is warm and  dry. No rash noted. She is not diaphoretic. No erythema. No pallor.  Psychiatric: She has a normal mood and affect. Her behavior is normal. Judgment and thought content normal.      ASSESSMENT AND PLAN

## 2014-06-24 NOTE — Assessment & Plan Note (Signed)
This improved significantly with Toprol.

## 2014-07-03 DIAGNOSIS — M4805 Spinal stenosis, thoracolumbar region: Secondary | ICD-10-CM | POA: Diagnosis not present

## 2014-07-03 DIAGNOSIS — Z79891 Long term (current) use of opiate analgesic: Secondary | ICD-10-CM | POA: Diagnosis not present

## 2014-07-04 ENCOUNTER — Other Ambulatory Visit: Payer: Medicare Other

## 2014-07-04 DIAGNOSIS — H35342 Macular cyst, hole, or pseudohole, left eye: Secondary | ICD-10-CM | POA: Diagnosis not present

## 2014-07-12 NOTE — Op Note (Signed)
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  POSTOPERATIVE DIAGNOSIS:  Age-related nuclear cataract, right eye (H25.11).  PROCEDURE:  Phacoemulsification with posterior chamber intraocular lens, right eye, model SN60WF  SURGEON:  Lyla Glassing, MD  INDICATIONS:  This is a 74 year old female with decreased vision in the right eye.  PROCEDURE:  The risks and benefits of cataract surgery were discussed at length with the patient, including bleeding, infection, retinal detachment, re-operation, diplopia, ptosis, loss of vision, and loss of the eye. Informed consent was obtained. On the day of surgery, several sets of preoperative medication were administered to the operative eye including 0.5% tetracaine, 1% cyclopentolate, 10% phenylephrine, 0.5% ketorolac, 0.5% gatifloxacin, and 2% lidocaine.  The patient was taken to the operating room and sedated via IV sedation. Topical tetracaine was placed in the eye. The operative eye was prepped using a 10% Betadine solution and then covered in sterile drapes leaving only the operative eye exposed. A Lieberman lid speculum was placed to provide exposure. Using 0.12 forceps and a sideport blade, a paracentesis was created. Then a mixture of BSS, preservative free lidocaine, and epinephrine was injected into the anterior chamber. Next, a 2.4 mm keratome blade was used to create a two-step full-thickness clear corneal incision temporally. The cystitome and Utrata forceps were used to create a continuous capsulorrhexis in the anterior lens capsule. BSS on a hydrodissection cannula was used to perform gentle hydrodissection. Phacoemulsification was then performed to remove the nucleus. Irrigation and aspiration was performed to remove the remaining cortical material. Provisc was injected to fill the capsular bag and anterior  chamber. A 19.0-diopter SN60WF intraocular lens was injected into the capsular bag. The Connor wand was used to rotate it into proper position in the capsular bag. Irrigation and aspiration was performed to remove the remaining Viscoelastic material from the eye. BSS on a 30-gauge cannula was used to hydrate the wound. An intracameral antibiotic was administered. The wounds were checked and found to be watertight. The lid speculum and drapes were carefully removed. Several drops of Vigamox were placed in the operative eye. The eye was covered with protective eyewear. The patient was taken to the recovery area in good condition. There were no complications.   ____________________________ Lyla Glassing, MD nm:nb D: 01/09/2014 13:01:53 ET T: 01/10/2014 01:18:51 ET JOB#: [id number]  cc: Lyla Glassing, MD, <Dictator> Lyla Glassing MD ELECTRONICALLY SIGNED 01/30/2014 13:46

## 2014-07-14 ENCOUNTER — Other Ambulatory Visit: Payer: Medicare Other

## 2014-07-16 NOTE — Op Note (Signed)
PATIENT NAME:  [name], [name] MR#:  [id number] DATE OF BIRTH:  05-May-1940  DATE OF PROCEDURE:  02/27/2014  PREOPERATIVE DIAGNOSIS: Nuclear sclerotic cataract of the left eye, which is H 25.120.  POSTOPERATIVE DIAGNOSIS: Nuclear sclerotic cataract of the left eye, which is H 25.120.  PROCEDURES: Phacoemulsification with posterior chamber intraocular lens insertion, model MA60AC 17.5 diopters.   SURGEON: Lyla Glassing, MD  INDICATIONS: This is a 74 year old woman with decreased vision in her left eye due to nuclear sclerotic cataract.   PROCEDURE IN DETAIL: The risks and benefits of cataract surgery were discussed at length with the patient, including bleeding, infection, retinal detachment, re-operation, diplopia, ptosis, loss of vision, and loss of the eye. Informed consent was obtained. On the day of surgery, several sets of preoperative medication were administered to the operative eye including 0.5% tetracaine,1% cyclopentolate, 10% phenylephrine, 0.5% ketorolac, 0.5% gatifloxacin, and 2% lidocaine .  The patient was taken to the operating room and sedated via IV sedation. Topical tetracaine was placed in the eye. The operative eye was prepped using a 10% Betadine solution and then covered in sterile drapes leaving only the operative eye exposed. A Lieberman lid speculum was placed to provide exposure. Using 0.12 forceps and a sideport blade, a paracentesis was created. Then a mixture of BSS, preservative free lidocaine, and epinephrine was injected into the anterior chamber. Next, a 2.4 mm keratome blade was used to create a two-step full-thickness clear corneal incision temporally. The cystitome and Utrata forceps were used to create a continuous capsulorrhexis in the anterior lens capsule. BSS on a hydrodissection cannula was used to perform gentle hydrodissection. Phacoemulsification was then performed to remove the nucleus. Irrigation and aspiration was performed to remove the remaining  cortical material. Provisc was injected to fill the capsular bag and anterior chamber. An 18.5 diopter SN60WF intraocular lens was injected into the capsular bag. At this point the lens was found to be unstable with inadequate posterior capsular support. As such, intraocular scissors were inserted into the anterior chamber and the lens was cut and removed from the eye. A thorough anterior vitrectomy was conducted. An MA60AC 17.5 diopter lens was loaded and injected into the ciliary sulcus. Further anterior vitrectomy and irrigation aspiration was performed to remove the remaining viscoelastic from the eye. The main wound was closed with two 10-0 nylon sutures and the paracentesis was closed with one 10-0 nylon suture. Intracameral cefuroxime was administered. The wounds were checked and found to be watertight. The lid speculum and drapes were carefully removed. Several drops of Vigamox were placed in the operative eye. The eye was covered with protective eyewear. The patient was taken to the recovery area in good condition.    ____________________________ Lyla Glassing, MD nm:bm D: 02/27/2014 12:00:00 ET T: 02/28/2014 00:49:49 ET JOB#: [id number]  cc: Lyla Glassing, MD, <Dictator> Lyla Glassing MD ELECTRONICALLY SIGNED 04/02/2014 12:51

## 2014-07-20 NOTE — H&P (Signed)
PATIENT NAME:  [name], [name] MR#:  [id number] DATE OF BIRTH:  Jun 12, 1940  DATE OF ADMISSION:  03/31/2014  CHIEF COMPLAINT: Centralized low back pain with radiation into the bilateral buttocks.   PROCEDURE: None.   HISTORY OF PRESENT ILLNESS: [name] is a pleasant 74 year old white female with long-standing history of low back pain. She has had previous surgery back in 1980 with a laminectomy at that time and has had intermittent pain, most notably starting back about a year, year and a half ago. The pain is centralized to the low back. It is a VAS of 5, worse with activity and during activity, but does not appear to be influenced by time of day and aggravated by bending, lifting, motion, sitting and prolonged standing or stooping. Alleviating factors include rest, lying down and medication management. She has a history of bladder cancer, but no problems with recent change in bladder function or bowel function. No weakness is reported other than in her low back, but no lower extremity weakness or problems of sensory dysfunction. The pain is primarily sharp, shooting, located in the mid lower back region. She has had an MRI dated 02/17/2014 that shows evidence of diffuse degenerative changes, most notably with mild spinal stenosis at T12-L1; at L1-L2, there is moderate disk bulging with left and mild facet hypertrophy with severe left neural foraminal stenosis; at L2-L3, there is moderate facet with severe spinal stenosis; at L3-L4, there is a circumferential disk bulge with severe facet and ligament hypertrophy and severe spinal stenosis; at L4-L5, there is evidence of previous hemilaminectomy; and at L5-S1 severe facet hypertrophy without stenosis.   PAST MEDICAL HISTORY: Significant for cervical spinal stenosis, constipation, thoracolumbar stenosis, COPD, chronic systolic heart failure, bladder neoplasm, gastric ulcer, congestive dilated cardiomyopathy, bladder cancer, hematuria, with a left bundle  branch block and atrial ectopy and palpitations. She also has a history of anemia and epigastric pain, previously worked up in a September admission to the hospital.   REVIEW OF SYSTEMS:  RESPIRATORY: Reveals no significant pulmonary distress with wheezing or shortness of breath or cough.  NEUROLOGIC: She denies any numbness or tingling anywhere. No lower extremity weakness reported. PSYCHOLOGIC: Negative. GASTROINTESTINAL: Negative.   SOCIAL HISTORY: She is a widow with 6 children and used to smoke a pack per day, quit smoking 4 years ago and started the age of 81. She is currently retired.   CURRENT MEDICATIONS: Include iron, hydrocodone 10/325 one tablet every 6 hours, lisinopril, metoprolol, Protonix.  PHYSICAL EXAMINATION: GENERAL: The patient is a pleasant white female, alert and oriented x 3, cooperative and compliant.  HEART: Regular rate and rhythm. I could not appreciate a murmur. LUNGS: Clear to auscultation. Breath sounds are distant with no wheezing.  HEENT: Pupils are equally round and reactive to light. Extraocular muscles are intact. EXTREMITIES: Inspection of the lower extremities reveals no evidence of any loss of sensation. She has 5-/5 strength, flexion and extension at the knees and hips. She has a well-healed midline scar in the low back region. With the patient standing and extension of the low back, she does get reproduction of her primary pain complaint with left and right lateral rotation.   ASSESSMENT:  1.  Chronic low back pain, status post laminectomy, with diffuse degenerative changes to the low back and low back syndrome.  2.  Diffuse spinal stenosis.  3.  Diffuse facet arthropathy.   PLAN:  1.  I have had a long discussion with [name] regarding her care  and feel that she would be a candidate for a trial of epidural steroids to see if she can get some relief from the chronic back pain she is experiencing. Unfortunately, she has required hydrocodone for a  prolonged period of time. I think it is reasonable for her to continue this and hopefully adjunctive therapy with epidural steroids would be of benefit for.  2.  I went over the risks and benefits of the procedure with her in full detail. All questions are answered.  3.  We will have her return to clinic next available day for her first epidural injection.  4.  She is a candidate for a facet diagnostic procedure as well with the pain that she has experienced on extension of a facetogenic nature.    ____________________________ Alvina Filbert Andree Elk, MD jga:TT D: 03/31/2014 14:34:24 ET T: 03/31/2014 14:55:37 ET JOB#: [id number]  cc: Alvina Filbert. Andree Elk, MD, <Dictator> Deborra Medina, MD Alvina Filbert Olubunmi Rothenberger MD ELECTRONICALLY SIGNED 04/16/2014 9:15

## 2014-07-21 ENCOUNTER — Other Ambulatory Visit: Payer: Medicare Other

## 2014-07-22 ENCOUNTER — Other Ambulatory Visit: Payer: Medicare Other

## 2014-07-31 ENCOUNTER — Other Ambulatory Visit (INDEPENDENT_AMBULATORY_CARE_PROVIDER_SITE_OTHER): Payer: Medicare Other

## 2014-07-31 DIAGNOSIS — I10 Essential (primary) hypertension: Secondary | ICD-10-CM

## 2014-07-31 LAB — BASIC METABOLIC PANEL
BUN: 13 mg/dL (ref 6–23)
CO2: 29 mEq/L (ref 19–32)
CREATININE: 0.65 mg/dL (ref 0.40–1.20)
Calcium: 10.3 mg/dL (ref 8.4–10.5)
Chloride: 98 mEq/L (ref 96–112)
GFR: 94.35 mL/min (ref >60.00)
GLUCOSE: 94 mg/dL (ref 70–99)
POTASSIUM: 4.4 meq/L (ref 3.5–5.1)
Sodium: 133 mEq/L — ABNORMAL LOW (ref 135–145)

## 2014-08-11 DIAGNOSIS — N390 Urinary tract infection, site not specified: Secondary | ICD-10-CM | POA: Diagnosis not present

## 2014-08-11 DIAGNOSIS — C678 Malignant neoplasm of overlapping sites of bladder: Secondary | ICD-10-CM | POA: Diagnosis not present

## 2014-08-13 ENCOUNTER — Encounter: Payer: Medicare Other | Admitting: Internal Medicine

## 2014-08-26 DIAGNOSIS — Z5111 Encounter for antineoplastic chemotherapy: Secondary | ICD-10-CM | POA: Diagnosis not present

## 2014-08-26 DIAGNOSIS — C670 Malignant neoplasm of trigone of bladder: Secondary | ICD-10-CM | POA: Diagnosis not present

## 2014-09-02 DIAGNOSIS — C670 Malignant neoplasm of trigone of bladder: Secondary | ICD-10-CM | POA: Diagnosis not present

## 2014-09-02 DIAGNOSIS — Z5111 Encounter for antineoplastic chemotherapy: Secondary | ICD-10-CM | POA: Diagnosis not present

## 2014-09-09 DIAGNOSIS — Z5111 Encounter for antineoplastic chemotherapy: Secondary | ICD-10-CM | POA: Diagnosis not present

## 2014-09-09 DIAGNOSIS — C675 Malignant neoplasm of bladder neck: Secondary | ICD-10-CM | POA: Diagnosis not present

## 2014-09-17 ENCOUNTER — Encounter: Payer: Self-pay | Admitting: Internal Medicine

## 2014-09-17 ENCOUNTER — Ambulatory Visit (INDEPENDENT_AMBULATORY_CARE_PROVIDER_SITE_OTHER): Payer: Medicare Other | Admitting: Internal Medicine

## 2014-09-17 VITALS — BP 140/80 | HR 75 | Ht 68.0 in | Wt 115.5 lb

## 2014-09-17 DIAGNOSIS — Z4502 Encounter for adjustment and management of automatic implantable cardiac defibrillator: Secondary | ICD-10-CM

## 2014-09-17 DIAGNOSIS — I5022 Chronic systolic (congestive) heart failure: Secondary | ICD-10-CM | POA: Diagnosis not present

## 2014-09-17 DIAGNOSIS — I420 Dilated cardiomyopathy: Secondary | ICD-10-CM | POA: Diagnosis not present

## 2014-09-17 DIAGNOSIS — I482 Chronic atrial fibrillation: Secondary | ICD-10-CM

## 2014-09-17 LAB — CUP PACEART INCLINIC DEVICE CHECK
Battery Remaining Longevity: 89 mo
Battery Voltage: 3.02 V
Brady Statistic AS VP Percent: 59.66 %
Brady Statistic RA Percent Paced: 39.01 %
Brady Statistic RV Percent Paced: 95.41 %
HIGH POWER IMPEDANCE MEASURED VALUE: 228 Ohm
HIGH POWER IMPEDANCE MEASURED VALUE: 73 Ohm
Lead Channel Impedance Value: 513 Ohm
Lead Channel Pacing Threshold Amplitude: 0.5 V
Lead Channel Pacing Threshold Pulse Width: 0.4 ms
Lead Channel Pacing Threshold Pulse Width: 0.4 ms
Lead Channel Sensing Intrinsic Amplitude: 20.625 mV
Lead Channel Sensing Intrinsic Amplitude: 4.5 mV
Lead Channel Setting Pacing Amplitude: 2 V
Lead Channel Setting Pacing Amplitude: 2.5 V
Lead Channel Setting Pacing Pulse Width: 0.4 ms
Lead Channel Setting Sensing Sensitivity: 0.3 mV
MDC IDC MSMT LEADCHNL LV PACING THRESHOLD AMPLITUDE: 0.75 V
MDC IDC MSMT LEADCHNL RA IMPEDANCE VALUE: 418 Ohm
MDC IDC MSMT LEADCHNL RV PACING THRESHOLD AMPLITUDE: 0.75 V
MDC IDC MSMT LEADCHNL RV PACING THRESHOLD PULSEWIDTH: 0.4 ms
MDC IDC SESS DTM: 20160629124437
MDC IDC SET LEADCHNL LV PACING AMPLITUDE: 2 V
MDC IDC SET LEADCHNL RV PACING PULSEWIDTH: 0.4 ms
MDC IDC SET ZONE DETECTION INTERVAL: 350 ms
MDC IDC SET ZONE DETECTION INTERVAL: 450 ms
MDC IDC STAT BRADY AP VP PERCENT: 38.35 %
MDC IDC STAT BRADY AP VS PERCENT: 0.66 %
MDC IDC STAT BRADY AS VS PERCENT: 1.34 %
Zone Setting Detection Interval: 250 ms
Zone Setting Detection Interval: 300 ms

## 2014-09-17 MED ORDER — METOPROLOL SUCCINATE ER 25 MG PO TB24: 25.0000 mg | ORAL_TABLET | Freq: Every day | ORAL | Status: DC

## 2014-09-17 NOTE — Patient Instructions (Addendum)
Medication Instructions:  Your physician recommends that you continue on your current medications as directed. Please refer to the Current Medication list given to you today.  Labwork: None ordered  Testing/Procedures: Your physician has requested that you have an echocardiogram. Echocardiography is a painless test that uses sound waves to create images of your heart. It provides your doctor with information about the size and shape of your heart and how well your heart's chambers and valves are working. This procedure takes approximately one hour. There are no restrictions for this procedure.  Please call [phone] when you are ready to schedule this test.  Follow-Up: Remote monitoring is used to monitor your Pacemaker of ICD from home. This monitoring reduces the number of office visits required to check your device to one time per year. It allows [place] to keep an eye on the functioning of your device to ensure it is working properly. You are scheduled for a device check from home on 12/17/14. You may send your transmission at any time that day. If you have a wireless device, the transmission will be sent automatically. After your physician reviews your transmission, you will receive a postcard with your next transmission date.  Your physician wants you to follow-up in: 9 months with Dr. Caryl Comes.  You will receive a reminder letter in the mail two months in advance. If you don't receive a letter, please call our office to schedule the follow-up appointment.  Thank you for choosing [facility]!!

## 2014-09-17 NOTE — Progress Notes (Signed)
Electrophysiology Office Note   Date:  09/17/2014   ID:  [name], DOB 1940/10/15, MRN [id number]  PCP:  Bianca Mc, MD  Cardiologist:  [name] Primary Electrophysiologist:  Bianca Axe, MD    Chief Complaint  Patient presents with  . Follow-up    pacercheck     History of Present Illness: [name] is a 74 y.o. female seen in followup  for CRT-D implanted 2/16.   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 She also had tracings in the hospital with the operating QRS in lead V1 suggesting alternating bundle branch block; however those times, the limb leads still demonstrated left bundle branch block  Catheterization from12/15 reviewed>> nonobstructive disease    Patient is much improved following CRT-D. She is now exploring back surgery for problems with debilitating pain   Today, she denies symptoms of palpitations, chest pain,   orthopnea, PND, lower extremity edema, claudication, dizziness, presyncope, syncope, bleeding, or neurologic sequela. She is having problems with shortness of breath and fatigue.   The patient is tolerating medications without difficulties and is otherwise without complaint today.    Past Medical History  Diagnosis Date  . Ectopic atrial beats   . GERD (gastroesophageal reflux disease)     h/o ulcers  . COPD (chronic obstructive pulmonary disease)   . LBBB (left bundle branch block)   . AICD (automatic cardioverter/defibrillator) present   . Hypertension   . CHF (congestive heart failure)   . Anemia   . History of stomach ulcers 11/2013  . Migraines     "stopped before menopause"  . Arthritis     "in about all my joints; for sure in my back"  . Chronic lower back pain   . Bladder cancer 1995; 2015   Past Surgical History  Procedure Laterality Date  . Esophagogastroduodenoscopy N/A 11/28/2013    Procedure: ESOPHAGOGASTRODUODENOSCOPY (EGD);  Surgeon: Bianca Silence, MD;  Location[facility] ENDOSCOPY;  Service: Endoscopy;  Laterality: N/A;  . Back surgery    . Transurethral resection of bladder tumor N/A 12/13/2013    Procedure: TRANSURETHRAL RESECTION OF BLADDER TUMOR (TURBT);  Surgeon: Bianca Aloe, MD;  Location: [facility] ORS;  Service: Urology;  Laterality: N/A;  . Transurethral resection of bladder tumor N/A 01/17/2014    Procedure: TRANSURETHRAL RESECTION OF BLADDER TUMOR (TURBT);  Surgeon: Bianca Aloe, MD;  Location: [facility] ORS;  Service: Urology;  Laterality: N/A;  . Cataract extraction w/ intraocular lens  implant, bilateral Bilateral 12-2013 - 17-4081  . Cystoscopy with retrograde pyelogram, ureteroscopy and stent placement Bilateral 04/18/2014    Procedure: CYSTOSCOPY WITH RETROGRADE PYELOGRAM;  Surgeon: Bianca Aloe, MD;  Location: [facility] ORS;  Service: Urology;  Laterality: Bilateral;  . Transurethral resection of bladder tumor N/A 04/18/2014    Procedure: TRANSURETHRAL RESECTION OF BLADDER TUMOR (TURBT), CYSTOGRAM;  Surgeon: Bianca Aloe, MD;  Location: [facility] ORS;  Service: Urology;  Laterality: N/A;  . Cardiac defibrillator placement  05/07/2014    dual chamber  . Lumbar disc surgery  1980's    "ruptured disc"  . Cardiac catheterization  ~ 02/2014  . Bi-ventricular implantable cardioverter defibrillator N/A 05/07/2014    Procedure: BI-VENTRICULAR IMPLANTABLE CARDIOVERTER DEFIBRILLATOR  (CRT-D);  Surgeon: Bianca Sprang, MD;  Location: [facility] CATH LAB;  Service: Cardiovascular;  Laterality: N/A;     Current Outpatient Prescriptions  Medication Sig Dispense Refill  . HYDROcodone-acetaminophen (NORCO) 10-325 MG per tablet Take 1 tablet  by mouth every 6 (six) hours as needed (Pain).    Marland Kitchen lisinopril (PRINIVIL,ZESTRIL) 5 MG tablet Take 5 mg by mouth daily.     . metoprolol succinate (TOPROL-XL) 25 MG 24 hr tablet Take 1 tablet (25 mg total) by mouth daily. 30 tablet 3  . pantoprazole (PROTONIX) 40 MG tablet Take 40 mg by mouth daily.     .  polyethylene glycol (MIRALAX / GLYCOLAX) packet Take 17 g by mouth daily as needed for mild constipation.     Marland Kitchen spironolactone (ALDACTONE) 25 MG tablet Take 1 tablet (25 mg total) by mouth daily. 90 tablet 3   No current facility-administered medications for this visit.    Allergies:   Review of patient's allergies indicates no known allergies.   Social History:  The patient  reports that she quit smoking about 9 months ago. Her smoking use included Cigarettes. She has a 82.5 pack-year smoking history. She has never used smokeless tobacco. She reports that she does not drink alcohol or use illicit drugs.   Family History:  The patient's *family history includes Cancer in her brother and mother.    ROS:  Please see the history of present illness.  .   All other systems are reviewed and negative.    PHYSICAL EXAM: VS:  BP 140/80 mmHg  Pulse 75  Ht 5\' 8"  (1.727 m)  Wt 115 lb 8 oz (52.39 kg)  BMI 17.57 kg/m2 , BMI Body mass index is 17.57 kg/(m^2). GEN: Well nourished, well developed, in no acute distress HEENT: normal Neck: JVD 5-6 cm, carotid bruits, or masses Cardiac: R RR; 2/6  murmur  rubs, + S4 Respiratory:  clear to auscultation bilaterally, normal work of breathing Back without kyphosis or CVAT GI: soft, nontender, nondistended, + BS MS: no deformity or atrophy Skin: warm and dry,   Extremities No Clubbing cyanosis  Edema Neuro:  Strength and sensation are intact Psych: euthymic mood, full affect  EKG:  EKG is ordered today. It demonstrates sinus rhythm with pacemaker is pacing and a QRS duration of 80 ms  Recent Labs: 11/22/2013: ALT 14 11/27/2013: TSH 1.700 04/28/2014: Hemoglobin 13.4; Platelets 256 07/31/2014: BUN 13; Creatinine, Ser 0.65; Potassium 4.4; Sodium 133*    Lipid Panel     Component Value Date/Time   CHOL 175 08/31/2010 1740   TRIG 67 08/31/2010 1740   HDL 52 08/31/2010 1740   CHOLHDL 3.4 08/31/2010 1740   VLDL 13 08/31/2010 1740   LDLCALC 110*  08/31/2010 1740     Wt Readings from Last 3 Encounters:  09/17/14 115 lb 8 oz (52.39 kg)  06/24/14 119 lb 12.8 oz (54.341 kg)  05/07/14 120 lb (54.432 kg)      Other studies Reviewed: Additional studies/ records that were reviewed today include: cath   Review of the above records today demonstrates: as above   ASSESSMENT AND PLAN:    Cardiomyopathy nonischemic  Congestive heart failure-chronic systolic  COPD/emphysema  Left bundle branch block  Sinus bradycardia-borderline  Bladder cancer  She is improved following CRT-D. She is euvolemic0  Her blood pressure is elevated we will increase her metoprolol from 25-50  Her back surgery was concerned about her ejection fraction; we will use this opportunity to reassess LV function.    Current medicines are reviewed at length with the patient today.   The patient does not have concerns regarding her medicines.  The following changes were made today:  none  Labs/ tests ordered today include:  Orders Placed This Encounter  Procedures  . Implantable device check     Disposition:   FU with me 3 month(s)   Signed, Bianca Axe, MD  09/17/2014 1:09 PM     [address] [address] [phone] (office) [phone] (fax)

## 2014-09-19 DIAGNOSIS — M5136 Other intervertebral disc degeneration, lumbar region: Secondary | ICD-10-CM | POA: Diagnosis not present

## 2014-09-19 DIAGNOSIS — M4726 Other spondylosis with radiculopathy, lumbar region: Secondary | ICD-10-CM | POA: Diagnosis not present

## 2014-09-19 DIAGNOSIS — I10 Essential (primary) hypertension: Secondary | ICD-10-CM | POA: Diagnosis not present

## 2014-09-19 DIAGNOSIS — M4156 Other secondary scoliosis, lumbar region: Secondary | ICD-10-CM | POA: Diagnosis not present

## 2014-09-19 DIAGNOSIS — M549 Dorsalgia, unspecified: Secondary | ICD-10-CM | POA: Diagnosis not present

## 2014-09-19 DIAGNOSIS — M4806 Spinal stenosis, lumbar region: Secondary | ICD-10-CM | POA: Diagnosis not present

## 2014-10-07 DIAGNOSIS — J449 Chronic obstructive pulmonary disease, unspecified: Secondary | ICD-10-CM | POA: Diagnosis not present

## 2014-10-07 DIAGNOSIS — M4805 Spinal stenosis, thoracolumbar region: Secondary | ICD-10-CM | POA: Diagnosis not present

## 2014-12-08 DIAGNOSIS — K219 Gastro-esophageal reflux disease without esophagitis: Secondary | ICD-10-CM | POA: Diagnosis not present

## 2014-12-08 DIAGNOSIS — M4805 Spinal stenosis, thoracolumbar region: Secondary | ICD-10-CM | POA: Diagnosis not present

## 2014-12-08 DIAGNOSIS — Z79891 Long term (current) use of opiate analgesic: Secondary | ICD-10-CM | POA: Diagnosis not present

## 2014-12-10 DIAGNOSIS — C671 Malignant neoplasm of dome of bladder: Secondary | ICD-10-CM | POA: Diagnosis not present

## 2014-12-10 DIAGNOSIS — C672 Malignant neoplasm of lateral wall of bladder: Secondary | ICD-10-CM | POA: Diagnosis not present

## 2014-12-15 ENCOUNTER — Other Ambulatory Visit: Payer: Self-pay | Admitting: Urology

## 2014-12-17 ENCOUNTER — Ambulatory Visit (INDEPENDENT_AMBULATORY_CARE_PROVIDER_SITE_OTHER): Payer: Medicare Other | Admitting: *Deleted

## 2014-12-17 ENCOUNTER — Encounter: Payer: Self-pay | Admitting: Internal Medicine

## 2014-12-17 DIAGNOSIS — I5022 Chronic systolic (congestive) heart failure: Secondary | ICD-10-CM | POA: Diagnosis not present

## 2014-12-17 DIAGNOSIS — I420 Dilated cardiomyopathy: Secondary | ICD-10-CM | POA: Diagnosis not present

## 2014-12-18 NOTE — Progress Notes (Signed)
Remote ICD transmission.   

## 2014-12-25 LAB — CUP PACEART REMOTE DEVICE CHECK
Brady Statistic AP VP Percent: 37.48 %
Brady Statistic AP VS Percent: 0.64 %
Brady Statistic AS VP Percent: 60.64 %
Brady Statistic AS VS Percent: 1.23 %
Date Time Interrogation Session: 20160928084223
HighPow Impedance: 75 Ohm
Lead Channel Impedance Value: 361 Ohm
Lead Channel Impedance Value: 361 Ohm
Lead Channel Impedance Value: 361 Ohm
Lead Channel Impedance Value: 418 Ohm
Lead Channel Impedance Value: 418 Ohm
Lead Channel Impedance Value: 513 Ohm
Lead Channel Impedance Value: 589 Ohm
Lead Channel Impedance Value: 589 Ohm
Lead Channel Impedance Value: 608 Ohm
Lead Channel Pacing Threshold Amplitude: 0.5 V
Lead Channel Pacing Threshold Amplitude: 0.625 V
Lead Channel Pacing Threshold Amplitude: 0.75 V
Lead Channel Pacing Threshold Pulse Width: 0.4 ms
Lead Channel Pacing Threshold Pulse Width: 0.4 ms
Lead Channel Pacing Threshold Pulse Width: 0.4 ms
Lead Channel Sensing Intrinsic Amplitude: 3 mV
Lead Channel Sensing Intrinsic Amplitude: 3 mV
Lead Channel Setting Pacing Amplitude: 2.5 V
Lead Channel Setting Pacing Pulse Width: 0.4 ms
Lead Channel Setting Pacing Pulse Width: 0.4 ms
MDC IDC MSMT BATTERY REMAINING LONGEVITY: 89 mo
MDC IDC MSMT BATTERY VOLTAGE: 3.01 V
MDC IDC MSMT LEADCHNL LV IMPEDANCE VALUE: 342 Ohm
MDC IDC MSMT LEADCHNL LV IMPEDANCE VALUE: 608 Ohm
MDC IDC MSMT LEADCHNL LV IMPEDANCE VALUE: 608 Ohm
MDC IDC MSMT LEADCHNL RA IMPEDANCE VALUE: 418 Ohm
MDC IDC MSMT LEADCHNL RV SENSING INTR AMPL: 24.5 mV
MDC IDC MSMT LEADCHNL RV SENSING INTR AMPL: 24.5 mV
MDC IDC SET LEADCHNL LV PACING AMPLITUDE: 1.75 V
MDC IDC SET LEADCHNL RA PACING AMPLITUDE: 2 V
MDC IDC SET LEADCHNL RV SENSING SENSITIVITY: 0.3 mV
MDC IDC SET ZONE DETECTION INTERVAL: 250 ms
MDC IDC STAT BRADY RA PERCENT PACED: 38.12 %
MDC IDC STAT BRADY RV PERCENT PACED: 95.92 %
Zone Setting Detection Interval: 300 ms
Zone Setting Detection Interval: 350 ms
Zone Setting Detection Interval: 450 ms

## 2015-01-01 ENCOUNTER — Encounter (HOSPITAL_COMMUNITY): Payer: Self-pay | Admitting: *Deleted

## 2015-01-05 NOTE — H&P (Signed)
History of Present Illness                   F/u - PCP Dr. Maceo Pro.     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 History of bladder cancer in 1995 when she saw Dr. Leory Plowman  -Last upper tract - Jan 2016 bilateral RGP's - normal; Sept 2015 CT Hematuria - negative upper tracts, multiple bladder tumors.   -Last BCG - June 2016 BCG maintenance completed   -Last cysto - May 2016 - negative for tumor       Sep 2016 interval hx   Patient returns in continuing management of bladder cancer. She completed BCG maintenance June 2016. Couple weeks ago she had some gross hematuria which cleared quickly. She had red urine. She has some mild dysuria on and off. Better today. UA clear today.       Past Medical History Problems  1. History of Chronic constipation (K59.09) 2. History of bladder cancer (Z85.51) 3. History of cardiac arrhythmia (Z86.79) 4. History of cardiac disorder (Z86.79) 5. History of degenerative disc disease (Z87.39) 6. History of gastric ulcer (Z87.19) 7. History of hypertension (Z86.79)  Surgical History Problems  1. History of Back Surgery 2. History of Bladder Injection Of Cancer Treatment 3. History of Bladder Surgery 4. History of Cystoscopy With Fulguration Large Lesion (Over 5cm) 5. History of Cystoscopy With Fulguration Large Lesion (Over 5cm) 6. History of Cystoscopy With Fulguration Large Lesion (Over 5cm)  Current Meds 1. Hydrocodone-Acetaminophen 10-325 MG Oral Tablet;  Therapy: (Recorded:04Nov2015) to Recorded 2. Lisinopril 5 MG Oral Tablet;  Therapy: (Recorded:30Sep2015) to Recorded 3. Metoprolol Tartrate TABS;  Therapy: (Recorded:23Dec2015) to Recorded 4. MiraLax POWD; TAKE PACKET  PRN;  Therapy: (Recorded:03Feb2016) to Recorded 5. Nitrofurantoin Macrocrystal 100 MG Oral Capsule; Take 1 capsule twice daily;  Therapy: [id number] to (Evaluate:14Jun2016)  Requested for: [id number];  Last  Rx:25May2016 Ordered 6. Pantoprazole Sodium 40 MG Oral Tablet Delayed Release;  Therapy: (Recorded:30Sep2015) to Recorded  Allergies Medication  1. No Known Drug Allergies  Family History Problems  1. Family history of Colon cancer : Brother 2. Family history of Death : Mother, Father   Mother died at age 86 from lung cancerFather died at age 75 from liver cancer 3. Family history of liver cancer (Z80.0) : Father 4. Family history of lung cancer (Z80.1) : Mother  Social History Problems  1. Denied: History of Alcohol use 2. Caffeine use (F15.90)   2 cups per day 3. Current every day smoker (F17.200)   Smokes 1.5 PPD, has smoked for 54 years 4. Six children   2 sons, 4 daughters 5. Widowed  Vitals Vital Signs [Data Includes: Last 1 Day]  Recorded: 21Sep2016 02:38PM  Weight: 115 lb  BMI Calculated: 17.11 BSA Calculated: 1.63 Blood Pressure: 126 / 73 Temperature: 97.4 F Heart Rate: 68  Physical Exam Constitutional: Well nourished and well developed . No acute distress.  Pulmonary: No respiratory distress and normal respiratory rhythm and effort.  Cardiovascular: Heart rate and rhythm are normal . No peripheral edema.  Abdomen: The abdomen is soft and nontender. No masses are palpated. No CVA tenderness. No hernias are palpable. No hepatosplenomegaly noted.  Genitourinary:  Chaperone Present: erica.  Examination of the external genitalia shows normal female external genitalia and no lesions. The urethra is normal in appearance and not tender. There is no urethral mass. Vaginal exam demonstrates no abnormalities. The adnexa are  palpably normal. The bladder is non tender and not distended. The anus is normal on inspection. The perineum is normal on inspection.    Results/Data Urine [Data Includes: Last 1 Day]   21Sep2016  COLOR YELLOW   APPEARANCE CLEAR   SPECIFIC GRAVITY 1.010   pH 6.0   GLUCOSE NEGATIVE   BILIRUBIN NEGATIVE   KETONE NEGATIVE   BLOOD 1+    PROTEIN NEGATIVE   NITRITE NEGATIVE   LEUKOCYTE ESTERASE NEGATIVE   SQUAMOUS EPITHELIAL/HPF 0-5 HPF  WBC 0-5 WBC/HPF  RBC 0-2 RBC/HPF  BACTERIA NONE SEEN HPF  CRYSTALS NONE SEEN HPF  CASTS See Below LPF  Yeast NONE SEEN HPF   Procedure  Procedure: Cystoscopy  Chaperone Present: erica.  Indication: History of Urothelial Carcinoma.  Informed Consent: Risks, benefits, and potential adverse events were discussed and informed consent was obtained from the patient.  Prep: The patient was prepped with betadine.  Antibiotic prophylaxis: Ciprofloxacin.  Procedure Note:  Urethral meatus:. No abnormalities.  Anterior urethra: No abnormalities.  Prostatic urethra: No abnormalities.  Bladder: Visulization was clear. The ureteral orifices were in the normal anatomic position bilaterally and had clear efflux of urine. A systematic survey of the bladder demonstrated no bladder tumors or stones. The mucosa was smooth without abnormalities. Multiple tumors were identified in the bladder. A papillary tumor was seen in the bladder. This tumor was located on the right side, near the dome of the bladder. Another papillary tumor was seen in the bladder. This tumor was located on the left side of the bladder. The patient tolerated the procedure well.  Complications: None.    Assessment Assessed  1. 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 UA With REFLEX; [Do Not Release]; Status:Complete;   Done: 21Sep2016 02:13PM Malignant neoplasm of lateral wall of urinary bladder  2. 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 I discussed with the patient and the nature of risks benefits and alternatives to cystoscopy, bladder biopsy, fulguration,  mitomycin C instillation, retrograde pyelograms. She elects to proceed.    Signatures Electronically signed by : [name], M.D.; Dec 10 2014  3:04PM EST

## 2015-01-06 ENCOUNTER — Ambulatory Visit (HOSPITAL_COMMUNITY)
Admission: RE | Admit: 2015-01-06 | Discharge: 2015-01-06 | Disposition: A | Discharge status: Home / Self Care | Payer: Medicare Other | Source: Ambulatory Visit | Attending: Urology | Admitting: Urology

## 2015-01-06 ENCOUNTER — Encounter (HOSPITAL_COMMUNITY)
Admission: RE | Disposition: A | Discharge status: Home / Self Care | Payer: Self-pay | Source: Ambulatory Visit | Attending: Urology

## 2015-01-06 ENCOUNTER — Ambulatory Visit (HOSPITAL_COMMUNITY): Payer: Medicare Other | Admitting: Registered Nurse

## 2015-01-06 ENCOUNTER — Encounter (HOSPITAL_COMMUNITY): Payer: Self-pay | Admitting: *Deleted

## 2015-01-06 DIAGNOSIS — I499 Cardiac arrhythmia, unspecified: Secondary | ICD-10-CM | POA: Insufficient documentation

## 2015-01-06 DIAGNOSIS — C673 Malignant neoplasm of anterior wall of bladder: Secondary | ICD-10-CM | POA: Diagnosis not present

## 2015-01-06 DIAGNOSIS — Z808 Family history of malignant neoplasm of other organs or systems: Secondary | ICD-10-CM | POA: Diagnosis not present

## 2015-01-06 DIAGNOSIS — I509 Heart failure, unspecified: Secondary | ICD-10-CM | POA: Insufficient documentation

## 2015-01-06 DIAGNOSIS — C674 Malignant neoplasm of posterior wall of bladder: Secondary | ICD-10-CM | POA: Diagnosis not present

## 2015-01-06 DIAGNOSIS — J449 Chronic obstructive pulmonary disease, unspecified: Secondary | ICD-10-CM | POA: Diagnosis not present

## 2015-01-06 DIAGNOSIS — C678 Malignant neoplasm of overlapping sites of bladder: Secondary | ICD-10-CM | POA: Insufficient documentation

## 2015-01-06 DIAGNOSIS — K219 Gastro-esophageal reflux disease without esophagitis: Secondary | ICD-10-CM | POA: Insufficient documentation

## 2015-01-06 DIAGNOSIS — C679 Malignant neoplasm of bladder, unspecified: Secondary | ICD-10-CM | POA: Diagnosis not present

## 2015-01-06 DIAGNOSIS — I110 Hypertensive heart disease with heart failure: Secondary | ICD-10-CM | POA: Diagnosis not present

## 2015-01-06 DIAGNOSIS — Z79899 Other long term (current) drug therapy: Secondary | ICD-10-CM | POA: Insufficient documentation

## 2015-01-06 DIAGNOSIS — K5909 Other constipation: Secondary | ICD-10-CM | POA: Insufficient documentation

## 2015-01-06 DIAGNOSIS — Z801 Family history of malignant neoplasm of trachea, bronchus and lung: Secondary | ICD-10-CM | POA: Insufficient documentation

## 2015-01-06 DIAGNOSIS — Z8711 Personal history of peptic ulcer disease: Secondary | ICD-10-CM | POA: Diagnosis not present

## 2015-01-06 DIAGNOSIS — Z800 Family history of malignant neoplasm of digestive organs: Secondary | ICD-10-CM | POA: Diagnosis not present

## 2015-01-06 DIAGNOSIS — Z79891 Long term (current) use of opiate analgesic: Secondary | ICD-10-CM | POA: Insufficient documentation

## 2015-01-06 DIAGNOSIS — F17200 Nicotine dependence, unspecified, uncomplicated: Secondary | ICD-10-CM | POA: Diagnosis not present

## 2015-01-06 DIAGNOSIS — D509 Iron deficiency anemia, unspecified: Secondary | ICD-10-CM | POA: Diagnosis not present

## 2015-01-06 HISTORY — PX: CYSTOSCOPY W/ RETROGRADES: SHX1426

## 2015-01-06 LAB — CBC
HCT: 41.8 % (ref 36.0–46.0)
HEMOGLOBIN: 14 g/dL (ref 12.0–15.0)
MCH: 31.3 pg (ref 26.0–34.0)
MCHC: 33.5 g/dL (ref 30.0–36.0)
MCV: 93.3 fL (ref 78.0–100.0)
Platelets: 202 10*3/uL (ref 150–400)
RBC: 4.48 MIL/uL (ref 3.87–5.11)
RDW: 13.8 % (ref 11.5–15.5)
WBC: 7.7 10*3/uL (ref 4.0–10.5)

## 2015-01-06 LAB — BASIC METABOLIC PANEL
Anion gap: 7 (ref 5–15)
BUN: 11 mg/dL (ref 6–20)
CALCIUM: 9.7 mg/dL (ref 8.9–10.3)
CHLORIDE: 100 mmol/L — AB (ref 101–111)
CO2: 24 mmol/L (ref 22–32)
CREATININE: 0.75 mg/dL (ref 0.44–1.00)
Glucose, Bld: 77 mg/dL (ref 65–99)
Potassium: 4.4 mmol/L (ref 3.5–5.1)
Sodium: 131 mmol/L — ABNORMAL LOW (ref 135–145)

## 2015-01-06 SURGERY — CYSTOSCOPY, WITH RETROGRADE PYELOGRAM
Anesthesia: Spinal | Site: Bladder | Laterality: Bilateral

## 2015-01-06 MED ORDER — CEFAZOLIN SODIUM-DEXTROSE 2-3 GM-% IV SOLR
2.0000 g | INTRAVENOUS | Status: AC
  Administered 2015-01-06: 2 g via INTRAVENOUS

## 2015-01-06 MED ORDER — DEXAMETHASONE SODIUM PHOSPHATE 10 MG/ML IJ SOLN
INTRAMUSCULAR | Status: DC | PRN
  Administered 2015-01-06: 10 mg via INTRAVENOUS

## 2015-01-06 MED ORDER — PROMETHAZINE HCL 25 MG/ML IJ SOLN: 6.2500 mg | INTRAMUSCULAR | Status: DC | PRN

## 2015-01-06 MED ORDER — ONDANSETRON HCL 4 MG/2ML IJ SOLN
INTRAMUSCULAR | Status: AC
  Filled 2015-01-06: qty 2

## 2015-01-06 MED ORDER — MITOMYCIN CHEMO FOR BLADDER INSTILLATION 40 MG: 40.0000 mg | Freq: Once | INTRAVENOUS | Status: DC

## 2015-01-06 MED ORDER — DEXAMETHASONE SODIUM PHOSPHATE 10 MG/ML IJ SOLN
INTRAMUSCULAR | Status: AC
  Filled 2015-01-06: qty 1

## 2015-01-06 MED ORDER — SODIUM CHLORIDE 0.9 % IJ SOLN
INTRAMUSCULAR | Status: AC
  Filled 2015-01-06: qty 10

## 2015-01-06 MED ORDER — FENTANYL CITRATE (PF) 100 MCG/2ML IJ SOLN
INTRAMUSCULAR | Status: DC | PRN
  Administered 2015-01-06: 50 ug via INTRAVENOUS

## 2015-01-06 MED ORDER — LACTATED RINGERS IV SOLN
INTRAVENOUS | Status: DC | PRN
  Administered 2015-01-06 (×2): via INTRAVENOUS

## 2015-01-06 MED ORDER — MIDAZOLAM HCL 5 MG/5ML IJ SOLN
INTRAMUSCULAR | Status: DC | PRN
  Administered 2015-01-06: 1 mg via INTRAVENOUS

## 2015-01-06 MED ORDER — HYDROCODONE-ACETAMINOPHEN 10-325 MG PO TABS: 1.0000 | ORAL_TABLET | Freq: Once | ORAL | Status: DC

## 2015-01-06 MED ORDER — LACTATED RINGERS IV SOLN
INTRAVENOUS | Status: DC
  Administered 2015-01-06: 15:00:00 via INTRAVENOUS
  Administered 2015-01-06: 1000 mL via INTRAVENOUS

## 2015-01-06 MED ORDER — PROPOFOL 10 MG/ML IV BOLUS
INTRAVENOUS | Status: AC
  Filled 2015-01-06: qty 20

## 2015-01-06 MED ORDER — STERILE WATER FOR IRRIGATION IR SOLN
Status: DC | PRN
  Administered 2015-01-06: 3000 mL

## 2015-01-06 MED ORDER — PROPOFOL 500 MG/50ML IV EMUL
INTRAVENOUS | Status: DC | PRN
  Administered 2015-01-06: 100 ug/kg/min via INTRAVENOUS

## 2015-01-06 MED ORDER — FENTANYL CITRATE (PF) 250 MCG/5ML IJ SOLN
INTRAMUSCULAR | Status: AC
  Filled 2015-01-06: qty 25

## 2015-01-06 MED ORDER — BELLADONNA ALKALOIDS-OPIUM 16.2-60 MG RE SUPP
RECTAL | Status: AC
  Filled 2015-01-06: qty 1

## 2015-01-06 MED ORDER — IOHEXOL 300 MG/ML  SOLN
INTRAMUSCULAR | Status: DC | PRN
  Administered 2015-01-06: 18 mL

## 2015-01-06 MED ORDER — BUPIVACAINE IN DEXTROSE 0.75-8.25 % IT SOLN
INTRATHECAL | Status: DC | PRN
  Administered 2015-01-06: 1 mL via INTRATHECAL

## 2015-01-06 MED ORDER — FENTANYL CITRATE (PF) 100 MCG/2ML IJ SOLN: 25.0000 ug | INTRAMUSCULAR | Status: DC | PRN

## 2015-01-06 MED ORDER — ONDANSETRON HCL 4 MG/2ML IJ SOLN
INTRAMUSCULAR | Status: DC | PRN
  Administered 2015-01-06: 4 mg via INTRAVENOUS

## 2015-01-06 MED ORDER — LIDOCAINE HCL (CARDIAC) 20 MG/ML IV SOLN
INTRAVENOUS | Status: AC
  Filled 2015-01-06: qty 5

## 2015-01-06 MED ORDER — MITOMYCIN CHEMO FOR BLADDER INSTILLATION 40 MG
40.0000 mg | Freq: Once | INTRAVENOUS | Status: AC
  Administered 2015-01-06: 40 mg via INTRAVESICAL
  Filled 2015-01-06: qty 40

## 2015-01-06 MED ORDER — EPHEDRINE SULFATE 50 MG/ML IJ SOLN
INTRAMUSCULAR | Status: AC
  Filled 2015-01-06: qty 1

## 2015-01-06 MED ORDER — MEPERIDINE HCL 50 MG/ML IJ SOLN: 6.2500 mg | INTRAMUSCULAR | Status: DC | PRN

## 2015-01-06 MED ORDER — CEFAZOLIN SODIUM-DEXTROSE 2-3 GM-% IV SOLR
INTRAVENOUS | Status: AC
  Filled 2015-01-06: qty 50

## 2015-01-06 MED ORDER — MIDAZOLAM HCL 2 MG/2ML IJ SOLN
INTRAMUSCULAR | Status: AC
Start: 2015-01-06 — End: 2015-01-06
  Filled 2015-01-06: qty 4

## 2015-01-06 MED ORDER — PROPOFOL 10 MG/ML IV BOLUS
INTRAVENOUS | Status: DC | PRN
  Administered 2015-01-06: 20 mg via INTRAVENOUS

## 2015-01-06 MED ORDER — SODIUM CHLORIDE 0.9 % IR SOLN
Status: DC | PRN
  Administered 2015-01-06: 6000 mL

## 2015-01-06 SURGICAL SUPPLY — 22 items
BAG URINE DRAINAGE (UROLOGICAL SUPPLIES) ×2 IMPLANT
BAG URO CATCHER STRL LF (DRAPE) ×3 IMPLANT
BASKET STNLS GEMINI 4WIRE 3FR (BASKET) IMPLANT
BSKT STON RTRVL GEM 120X11 3FR (BASKET)
CATH FOLEY 2WAY SLVR  5CC 16FR (CATHETERS) ×2
CATH FOLEY 2WAY SLVR 5CC 16FR (CATHETERS) IMPLANT
CATH INTERMIT  6FR 70CM (CATHETERS) ×2 IMPLANT
CATH URET 5FR 28IN CONE TIP (BALLOONS) ×2
CATH URET 5FR 70CM CONE TIP (BALLOONS) ×1 IMPLANT
GLOVE BIOGEL M STRL SZ7.5 (GLOVE) ×3 IMPLANT
GOWN STRL REUS W/TWL LRG LVL3 (GOWN DISPOSABLE) ×3 IMPLANT
GOWN STRL REUS W/TWL XL LVL3 (GOWN DISPOSABLE) ×3 IMPLANT
GUIDEWIRE ANG ZIPWIRE 038X150 (WIRE) IMPLANT
GUIDEWIRE STR DUAL SENSOR (WIRE) ×3 IMPLANT
LOOPS RESECTOSCOPE DISP (ELECTROSURGICAL) ×2 IMPLANT
MANIFOLD NEPTUNE II (INSTRUMENTS) ×3 IMPLANT
PACK CYSTO (CUSTOM PROCEDURE TRAY) ×3 IMPLANT
PLUG CATH AND CAP STER (CATHETERS) ×2 IMPLANT
SYR 30ML LL (SYRINGE) ×2 IMPLANT
SYRINGE IRR TOOMEY STRL 70CC (SYRINGE) ×2 IMPLANT
TUBING CONNECTING 10 (TUBING) ×2 IMPLANT
TUBING CONNECTING 10' (TUBING) ×1

## 2015-01-06 NOTE — Anesthesia Postprocedure Evaluation (Signed)
  Anesthesia Post-op Note  Patient: [name]  Procedure(s) Performed: Procedure(s) (LRB): CYSTOSCOPY WITH  BLADDER BIOPSY BILATERAL RETROGRADE PYELOGRAM,INSTILLATION OF MITOMYCIN C (Bilateral)  Patient Location: PACU  Anesthesia Type: General  Level of Consciousness: awake and alert   Airway and Oxygen Therapy: Patient Spontanous Breathing  Post-op Pain: mild  Post-op Assessment: Post-op Vital signs reviewed, Patient's Cardiovascular Status Stable, Respiratory Function Stable, Patent Airway and No signs of Nausea or vomiting  Last Vitals:  Filed Vitals:   01/06/15 1306  BP: 142/79  Pulse: 65  Temp: 36.4 C  Resp: 20    Post-op Vital Signs: stable   Complications: No apparent anesthesia complications

## 2015-01-06 NOTE — Transfer of Care (Signed)
Immediate Anesthesia Transfer of Care Note  Patient: [name]  Procedure(s) Performed: Procedure(s): CYSTOSCOPY WITH  BLADDER BIOPSY BILATERAL RETROGRADE PYELOGRAM,INSTILLATION OF MITOMYCIN C (Bilateral)  Patient Location: PACU  Anesthesia Type:Spinal  Level of Consciousness: awake, alert , oriented and patient cooperative  Airway & Oxygen Therapy: Patient Spontanous Breathing and Patient connected to face mask oxygen  Post-op Assessment: Report given to RN and Post -op Vital signs reviewed and stable  Post vital signs: stable  Last Vitals:  Filed Vitals:   01/06/15 0704  BP: 111/71  Pulse: 61  Temp: 36.7 C  Resp: 18    Complications: No apparent anesthesia complications  T 12 spinal level

## 2015-01-06 NOTE — Anesthesia Preprocedure Evaluation (Addendum)
Anesthesia Evaluation  Patient identified by MRN, date of birth, ID band Patient awake    Reviewed: Allergy & Precautions, NPO status , Patient's Chart, lab work & pertinent test results  Airway Mallampati: II  TM Distance: >3 FB Neck ROM: Full    Dental no notable dental hx. 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%   Neuro/Psych negative neurological ROS  negative psych ROS   GI/Hepatic Neg liver ROS, PUD, GERD  Medicated,  Endo/Other  negative endocrine ROS  Renal/GU negative Renal ROS  negative genitourinary   Musculoskeletal negative musculoskeletal ROS (+)   Abdominal   Peds negative pediatric ROS (+)  Hematology negative hematology ROS (+)   Anesthesia Other Findings   Reproductive/Obstetrics negative OB ROS                            Anesthesia Physical  Anesthesia Plan  ASA: III  Anesthesia Plan: Spinal   Post-op Pain Management:    Induction:   Airway Management Planned: Simple Face Mask  Additional Equipment:   Intra-op Plan:   Post-operative Plan:   Informed Consent: I have reviewed the patients History and Physical, chart, labs and discussed the procedure including the risks, benefits and alternatives for the proposed anesthesia with the patient or authorized representative who has indicated his/her understanding and acceptance.   Dental advisory given  Plan Discussed with: CRNA  Anesthesia Plan Comments:         Anesthesia Quick Evaluation

## 2015-01-06 NOTE — Discharge Instructions (Signed)
Cystoscopy, Care After Refer to this sheet in the next few weeks. These instructions provide you with information on caring for yourself after your procedure. Your caregiver may also give you more specific instructions. Your treatment has been planned according to current medical practices, but problems sometimes occur. Call your caregiver if you have any problems or questions after your procedure. HOME CARE INSTRUCTIONS  Things you can do to ease any discomfort after your procedure include:  Drinking enough water and fluids to keep your urine clear or pale yellow.  Taking a warm bath to relieve any burning feelings. SEEK IMMEDIATE MEDICAL CARE IF:   You have an increase in blood in your urine.  You notice blood clots in your urine.  You have difficulty passing urine.  You have the chills.  You have abdominal pain.  You have a fever or persistent symptoms for more than 2-3 days.  You have a fever and your symptoms suddenly get worse. MAKE SURE YOU:   Understand these instructions.  Will watch your condition.  Will get help right away if you are not doing well or get worse.   This information is not intended to replace advice given to you by your health care provider. Make sure you discuss any questions you have with your health care provider.   Document Released: 09/24/2004 Document Revised: 03/28/2014 Document Reviewed: 08/29/2011 Elsevier Interactive Patient Education 2016 [place] Anesthesia, Adult, Care After Refer to this sheet in the next few weeks. These instructions provide you with information on caring for yourself after your procedure. Your health care provider may also give you more specific instructions. Your treatment has been planned according to current medical practices, but problems sometimes occur. Call your health care provider if you have any problems or questions after your procedure. WHAT TO EXPECT AFTER THE  PROCEDURE After the procedure, it is typical to experience:  Sleepiness.  Nausea and vomiting. HOME CARE INSTRUCTIONS  For the first 24 hours after general anesthesia:  Have a responsible person with you.  Do not drive a car. If you are alone, do not take public transportation.  Do not drink alcohol.  Do not take medicine that has not been prescribed by your health care provider.  Do not sign important papers or make important decisions.  You may resume a normal diet and activities as directed by your health care provider.  Change bandages (dressings) as directed.  If you have questions or problems that seem related to general anesthesia, call the hospital and ask for the anesthetist or anesthesiologist on call. SEEK MEDICAL CARE IF:  You have nausea and vomiting that continue the day after anesthesia.  You develop a rash. SEEK IMMEDIATE MEDICAL CARE IF:   You have difficulty breathing.  You have chest pain.  You have any allergic problems.   This information is not intended to replace advice given to you by your health care provider. Make sure you discuss any questions you have with your health care provider.   Document Released: 06/13/2000 Document Revised: 03/28/2014 Document Reviewed: 07/06/2011 Elsevier Interactive Patient Education 2016 [place]. Spinal Anesthesia and Epidural Anesthesia, Care After Refer to this sheet in the next few weeks. These instructions provide you with information about caring for yourself after your procedure. Your health care provider may also  give you more specific instructions. Your treatment has been planned according to current medical practices, but problems sometimes occur. Call your health care provider if you have any problems or questions after your procedure. WHAT TO EXPECT AFTER THE PROCEDURE After your procedure, it is typical to have the following:  Sleepiness.  Nausea and vomiting. HOME CARE INSTRUCTIONS  For the  first 24 hours after anesthetic medicine:  Do not drive or operate heavy machinery.  Do not drink alcohol.  Do not make important decisions.  Have someone stay with you for at least 24-48 hours.  Drink enough fluid to keep your urine clear or pale yellow. SEEK MEDICAL CARE IF:  You have nausea and vomiting that continue the day after anesthetic medicine was given.  You develop a rash. SEEK IMMEDIATE MEDICAL CARE IF:   You have a fever.  You have a persistent or severe headache.  You develop blurred or double vision.  You develop dizziness or lightheadedness.  You faint.  You have weakness, numbness, or tingling in your arms or legs.  You have difficulty breathing.  You are unable to pass urine.   This information is not intended to replace advice given to you by your health care provider. Make sure you discuss any questions you have with your health care provider.   Document Released: 05/28/2003 Document Revised: 03/28/2014 Document Reviewed: 10/16/2013 Elsevier Interactive Patient Education [employer].

## 2015-01-06 NOTE — Op Note (Signed)
Preoperative diagnosis: Urothelial carcinoma overlapping sites of the bladder Postoperative diagnosis: Same  Procedure: Cystoscopy, TURBT 2-5 cm, bilateral retrograde pyelogram, postoperative installation of mitomycin C in PACU  Surgeon: Junious Silk  Anesthesia: Gen.  Indication for procedure 74 year old patient with EF of 21% who requires finals for endoscopic cases with recurrent high-grade bladder tumor.  Findings: On exam under anesthesia the bladder is palpably normal on bimanual exam.  On cystoscopy there were multiple papillary tumors scattered throughout the right left abdomen bladder neck. They were all small been in aggregate added up to about 5 cm. They were also superficial appearing in nature.  Right retrograde pyelogram-this outlined a single ureter single collecting system unit without filling defect, stricture or dilation  Left retrograde pyelogram-this outlined a single ureter single collecting system unit without filling defect stricture or dilation.  Description of procedure: After consent was obtained patient brought to the operating room. After adequate anesthesia she was placed in lithotomy position and prepped and draped in the usual sterile fashion. A timeout was performed to confirm the patient and procedure. An exam under anesthesia was performed. The cystoscope was passed per urethra and I was able to get the majority of the tumors with multiple cold cup biopsies and fulguration. This included the first 3 specimens.  The bladder was copiously irrigated several times with water. A 6 French open-ended catheter was passed into the right ureteral orifice and retrograde injection of contrast was performed. The left ureteral orifice was cannulated with a 6 [place] open-ended catheter and retrograde injection of contrast was performed. We converted to saline.   There was a larger bladder tumor at the right anterior bladder neck and right anterior and the loop was better for  this. Also a smaller tumor left posterior at the bladder neck. These 3 tumors were resected and sent in aggregate as the fourth specimen. Hemostasis was excellent.A 16 French Foley was placed without difficulty. Urine was clear. She was awakened and taken to the recovery room in stable condition.  Postoperative installation of mitomycin C: Mitomycin-C was instilled in the bladder and left indwelling for 50 minutes. It was then drained.  Complications: None  Blood loss: Minimal  Specimens to pathology: #1 right posterior #2 left #3 left anterior which was really more toward the posterior #4 bladder tumors-this was the right dome and two bladder neck tumors.  Drains: 14 French Foley which will be removed after spinal resolves.   Disposition: Patient stable to PACU

## 2015-01-06 NOTE — Anesthesia Procedure Notes (Signed)
Spinal Patient location during procedure: OR End time: 01/06/2015 10:10 AM Staffing Resident/CRNA: Enrigue Catena E Performed by: anesthesiologist  Preanesthetic Checklist Completed: patient identified, site marked, surgical consent, pre-op evaluation, timeout performed, IV checked, risks and benefits discussed and monitors and equipment checked Spinal Block Patient position: sitting Prep: Betadine Patient monitoring: heart rate, continuous pulse ox and blood pressure Approach: right paramedian Location: L3-4 Injection technique: single-shot Needle Needle type: Spinocan  Needle gauge: 22 G Needle length: 9 cm Assessment Sensory level: T8 Additional Notes Expiration date of kit checked and confirmed. Patient tolerated procedure well, without complications.

## 2015-01-06 NOTE — Interval H&P Note (Signed)
History and Physical Interval Note:  01/06/2015 9:23 AM  [name]  has presented today for surgery, with the diagnosis of BLADDER CANCER  The various methods of treatment have been discussed with the patient and family. After consideration of risks, benefits and other options for treatment, the patient has consented to  Procedure(s): CYSTOSCOPY WITH  BLADDER BIOPSY BILATERAL RETROGRADE PYELOGRAM,INSTILLATION OF MITOMYCIN C (Bilateral) as a surgical intervention .  The patient's history has been reviewed, patient examined, no change in status, stable for surgery. She has been well. No dysuria or gross hematuria. I have reviewed the patient's chart and labs.  Questions were answered to the patient's satisfaction.     Robbie Rideaux

## 2015-01-06 NOTE — Progress Notes (Signed)
Patient was able to stand w asst and walk to BR. Unable to void.

## 2015-01-12 DIAGNOSIS — M545 Low back pain: Secondary | ICD-10-CM | POA: Diagnosis not present

## 2015-01-16 DIAGNOSIS — C678 Malignant neoplasm of overlapping sites of bladder: Secondary | ICD-10-CM | POA: Diagnosis not present

## 2015-01-28 ENCOUNTER — Encounter: Payer: Self-pay | Admitting: Cardiology

## 2015-02-09 DIAGNOSIS — Z79891 Long term (current) use of opiate analgesic: Secondary | ICD-10-CM | POA: Diagnosis not present

## 2015-02-09 DIAGNOSIS — Z1211 Encounter for screening for malignant neoplasm of colon: Secondary | ICD-10-CM | POA: Diagnosis not present

## 2015-02-09 DIAGNOSIS — M545 Low back pain: Secondary | ICD-10-CM | POA: Diagnosis not present

## 2015-02-09 DIAGNOSIS — Z23 Encounter for immunization: Secondary | ICD-10-CM | POA: Diagnosis not present

## 2015-02-17 DIAGNOSIS — C678 Malignant neoplasm of overlapping sites of bladder: Secondary | ICD-10-CM | POA: Diagnosis not present

## 2015-02-24 DIAGNOSIS — C678 Malignant neoplasm of overlapping sites of bladder: Secondary | ICD-10-CM | POA: Diagnosis not present

## 2015-02-24 DIAGNOSIS — Z5111 Encounter for antineoplastic chemotherapy: Secondary | ICD-10-CM | POA: Diagnosis not present

## 2015-03-03 DIAGNOSIS — C678 Malignant neoplasm of overlapping sites of bladder: Secondary | ICD-10-CM | POA: Diagnosis not present

## 2015-03-03 DIAGNOSIS — Z5111 Encounter for antineoplastic chemotherapy: Secondary | ICD-10-CM | POA: Diagnosis not present

## 2015-03-10 DIAGNOSIS — Z5111 Encounter for antineoplastic chemotherapy: Secondary | ICD-10-CM | POA: Diagnosis not present

## 2015-03-10 DIAGNOSIS — C678 Malignant neoplasm of overlapping sites of bladder: Secondary | ICD-10-CM | POA: Diagnosis not present

## 2015-03-17 DIAGNOSIS — C678 Malignant neoplasm of overlapping sites of bladder: Secondary | ICD-10-CM | POA: Diagnosis not present

## 2015-03-17 DIAGNOSIS — Z5111 Encounter for antineoplastic chemotherapy: Secondary | ICD-10-CM | POA: Diagnosis not present

## 2015-03-18 ENCOUNTER — Ambulatory Visit (INDEPENDENT_AMBULATORY_CARE_PROVIDER_SITE_OTHER): Payer: Medicare Other | Admitting: *Deleted

## 2015-03-18 DIAGNOSIS — I5022 Chronic systolic (congestive) heart failure: Secondary | ICD-10-CM | POA: Diagnosis not present

## 2015-03-18 DIAGNOSIS — I420 Dilated cardiomyopathy: Secondary | ICD-10-CM | POA: Diagnosis not present

## 2015-03-18 NOTE — Progress Notes (Signed)
Remote ICD transmission.   

## 2015-03-21 IMAGING — CR DG CERVICAL SPINE COMPLETE 4+V
5 series · 5 of 5 positions shown · non-contrast
Comparison: None.

CLINICAL DATA: Neuropathy.  Neck pain

EXAM:
CERVICAL SPINE  4+ VIEWS

[view not recorded (1 of 5)]
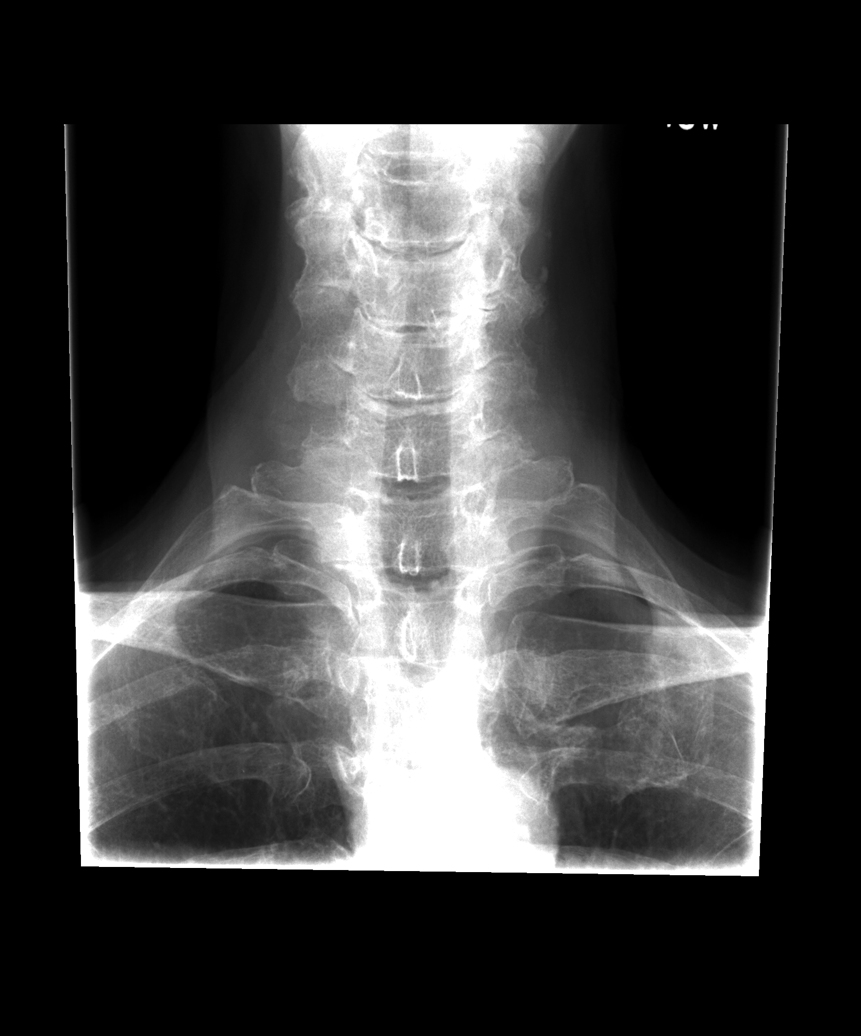

[view not recorded (2 of 5)]
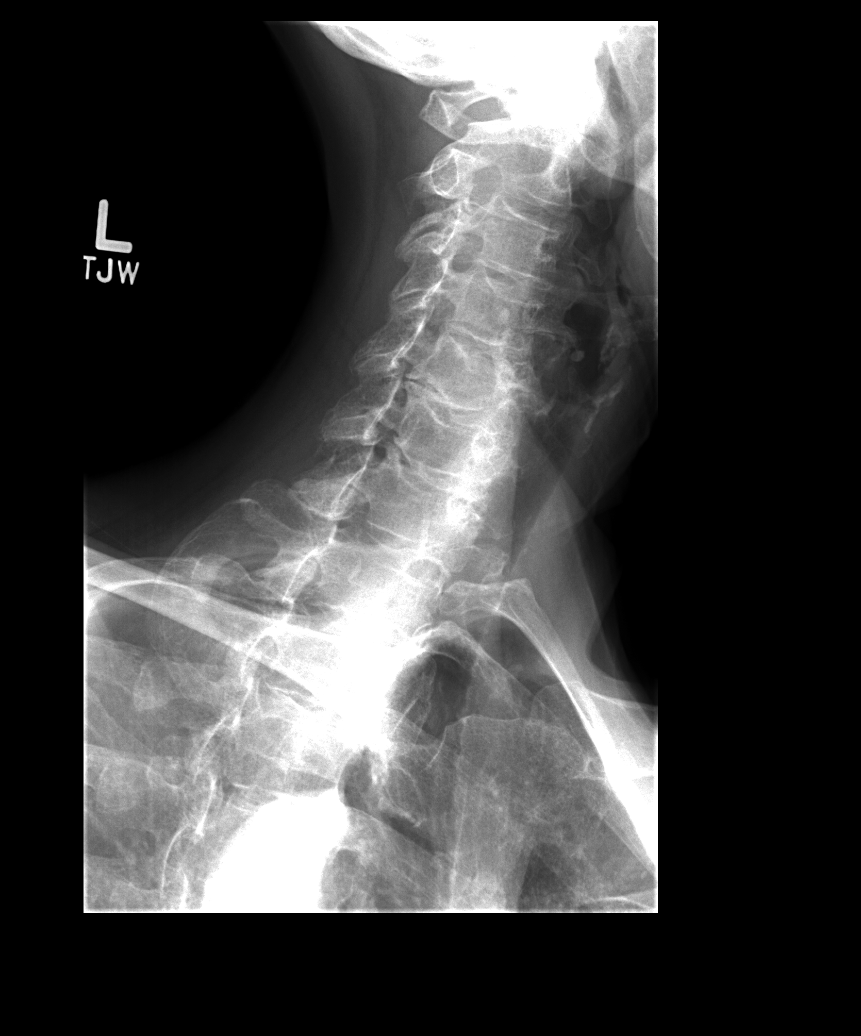

[view not recorded (3 of 5)]
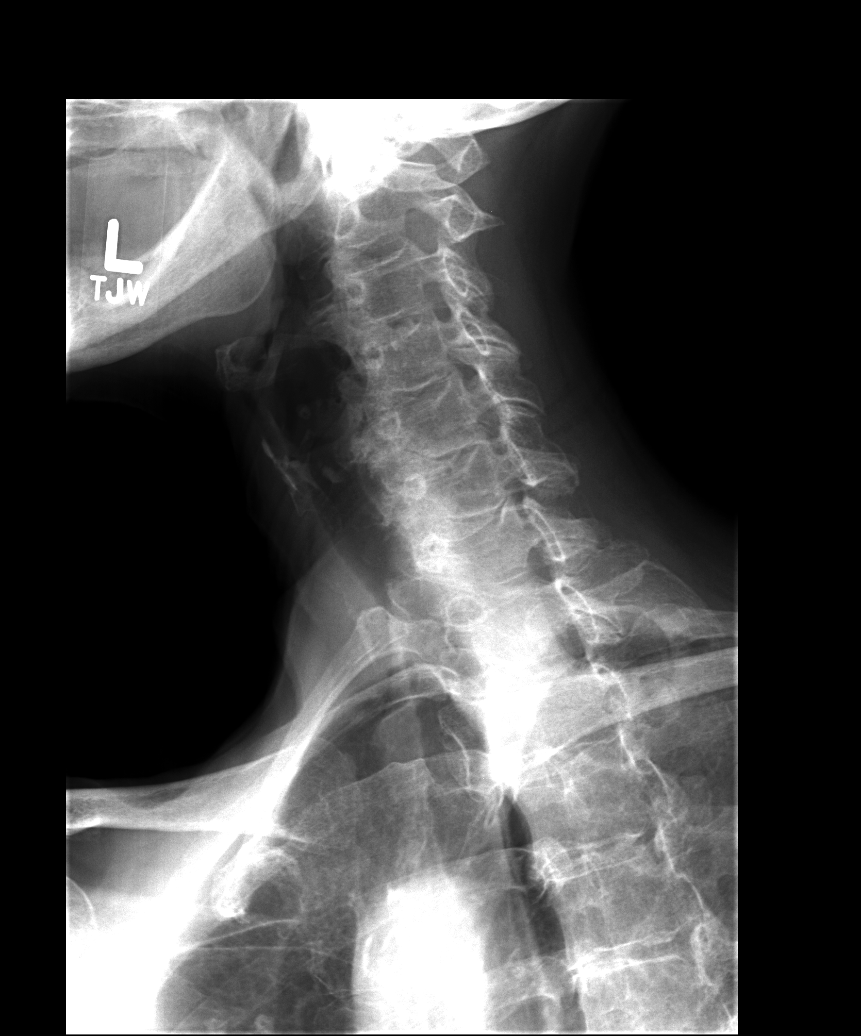

[view not recorded (4 of 5)]
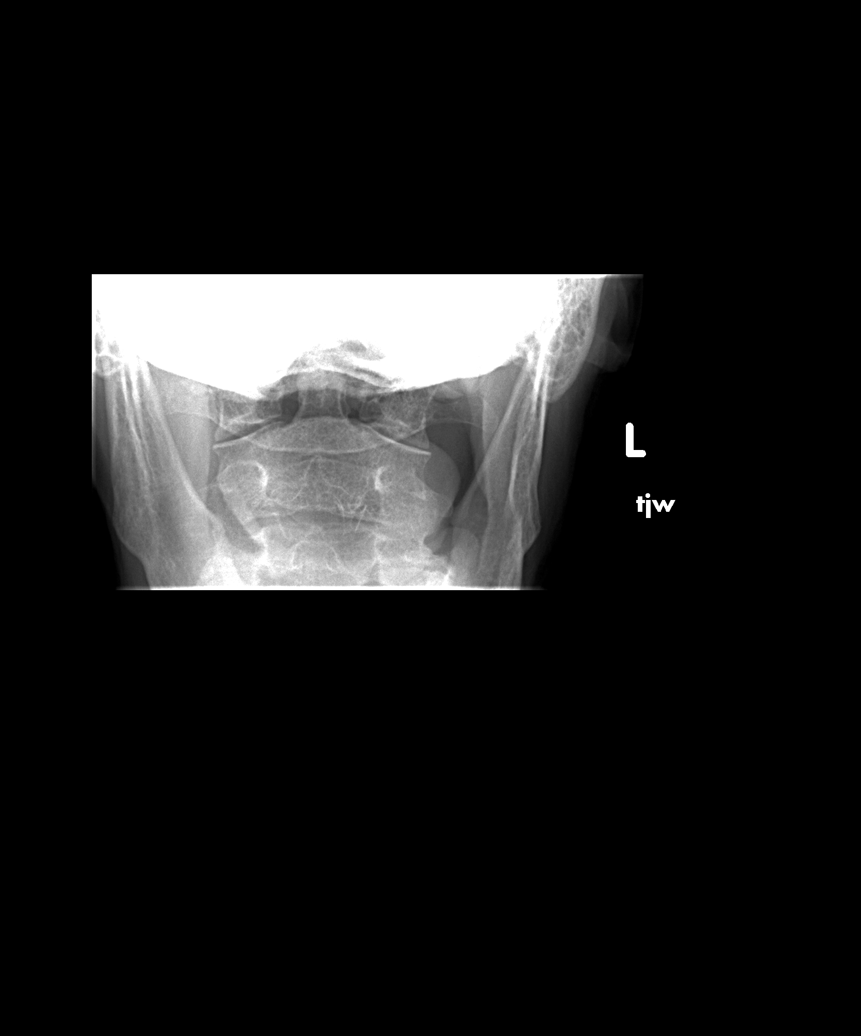

[view not recorded (5 of 5)]
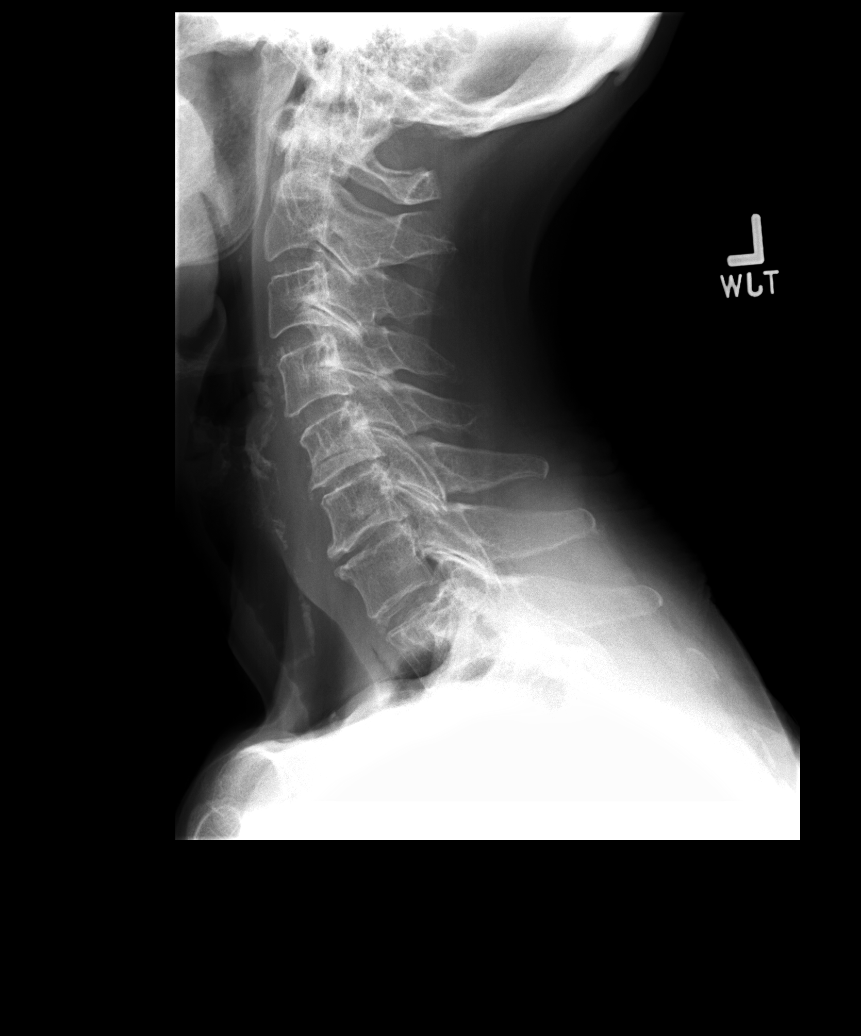

[5 of 5 positions shown; findings below may reference images not displayed]

FINDINGS: Normal cervical alignment. Negative for fracture or mass. Disc
degeneration and spondylosis at C5-6 and C6-7. Mild spurring at
C4-5. This is causing foraminal encroachment bilaterally at C4-5 and
C5-6 and C6-7. Mild facet degeneration.
IMPRESSION: Cervical degenerative changes as above.  No acute bony abnormality.

## 2015-03-25 DIAGNOSIS — Z0000 Encounter for general adult medical examination without abnormal findings: Secondary | ICD-10-CM | POA: Diagnosis not present

## 2015-03-25 DIAGNOSIS — C678 Malignant neoplasm of overlapping sites of bladder: Secondary | ICD-10-CM | POA: Diagnosis not present

## 2015-03-25 DIAGNOSIS — Z5111 Encounter for antineoplastic chemotherapy: Secondary | ICD-10-CM | POA: Diagnosis not present

## 2015-04-06 LAB — CUP PACEART REMOTE DEVICE CHECK
Battery Remaining Longevity: 84 mo
Battery Voltage: 3 V
Brady Statistic AP VP Percent: 33.95 %
Brady Statistic RV Percent Paced: 96.74 %
Date Time Interrogation Session: 20161228083323
HighPow Impedance: 67 Ohm
Implantable Lead Implant Date: 20160217
Implantable Lead Implant Date: 20160217
Lead Channel Impedance Value: 361 Ohm
Lead Channel Impedance Value: 399 Ohm
Lead Channel Impedance Value: 399 Ohm
Lead Channel Impedance Value: 399 Ohm
Lead Channel Impedance Value: 456 Ohm
Lead Channel Impedance Value: 475 Ohm
Lead Channel Impedance Value: 665 Ohm
Lead Channel Pacing Threshold Amplitude: 0.625 V
Lead Channel Sensing Intrinsic Amplitude: 25.875 mV
Lead Channel Sensing Intrinsic Amplitude: 3.75 mV
Lead Channel Setting Pacing Amplitude: 2 V
Lead Channel Setting Pacing Pulse Width: 0.4 ms
MDC IDC LEAD IMPLANT DT: 20160217
MDC IDC LEAD LOCATION: 753858
MDC IDC LEAD LOCATION: 753859
MDC IDC LEAD LOCATION: 753860
MDC IDC LEAD MODEL: 4298
MDC IDC MSMT LEADCHNL LV IMPEDANCE VALUE: 399 Ohm
MDC IDC MSMT LEADCHNL LV IMPEDANCE VALUE: 418 Ohm
MDC IDC MSMT LEADCHNL LV IMPEDANCE VALUE: 665 Ohm
MDC IDC MSMT LEADCHNL LV IMPEDANCE VALUE: 703 Ohm
MDC IDC MSMT LEADCHNL LV IMPEDANCE VALUE: 703 Ohm
MDC IDC MSMT LEADCHNL LV IMPEDANCE VALUE: 722 Ohm
MDC IDC MSMT LEADCHNL LV PACING THRESHOLD AMPLITUDE: 0.75 V
MDC IDC MSMT LEADCHNL LV PACING THRESHOLD PULSEWIDTH: 0.4 ms
MDC IDC MSMT LEADCHNL RA PACING THRESHOLD AMPLITUDE: 0.5 V
MDC IDC MSMT LEADCHNL RA PACING THRESHOLD PULSEWIDTH: 0.4 ms
MDC IDC MSMT LEADCHNL RA SENSING INTR AMPL: 3.75 mV
MDC IDC MSMT LEADCHNL RV PACING THRESHOLD PULSEWIDTH: 0.4 ms
MDC IDC MSMT LEADCHNL RV SENSING INTR AMPL: 25.875 mV
MDC IDC SET LEADCHNL LV PACING PULSEWIDTH: 0.4 ms
MDC IDC SET LEADCHNL RA PACING AMPLITUDE: 2 V
MDC IDC SET LEADCHNL RV PACING AMPLITUDE: 2.5 V
MDC IDC SET LEADCHNL RV SENSING SENSITIVITY: 0.3 mV
MDC IDC STAT BRADY AP VS PERCENT: 0.59 %
MDC IDC STAT BRADY AS VP PERCENT: 64.29 %
MDC IDC STAT BRADY AS VS PERCENT: 1.17 %
MDC IDC STAT BRADY RA PERCENT PACED: 34.54 %

## 2015-04-08 ENCOUNTER — Encounter: Payer: Self-pay | Admitting: Cardiology

## 2015-05-19 DIAGNOSIS — Z0000 Encounter for general adult medical examination without abnormal findings: Secondary | ICD-10-CM | POA: Diagnosis not present

## 2015-05-19 DIAGNOSIS — C678 Malignant neoplasm of overlapping sites of bladder: Secondary | ICD-10-CM | POA: Diagnosis not present

## 2015-06-24 DIAGNOSIS — M4805 Spinal stenosis, thoracolumbar region: Secondary | ICD-10-CM | POA: Diagnosis not present

## 2015-06-24 DIAGNOSIS — G894 Chronic pain syndrome: Secondary | ICD-10-CM | POA: Diagnosis not present

## 2015-06-24 DIAGNOSIS — Z79891 Long term (current) use of opiate analgesic: Secondary | ICD-10-CM | POA: Diagnosis not present

## 2015-06-25 ENCOUNTER — Other Ambulatory Visit: Payer: Self-pay | Admitting: Cardiovascular Disease

## 2015-06-26 NOTE — Telephone Encounter (Signed)
Please review for refill. Thanks!  

## 2015-06-26 NOTE — Telephone Encounter (Signed)
Rx sent to the pharmacy.

## 2015-07-27 ENCOUNTER — Other Ambulatory Visit: Payer: Self-pay | Admitting: *Deleted

## 2015-07-27 MED ORDER — SPIRONOLACTONE 25 MG PO TABS: ORAL_TABLET | ORAL | Status: DC

## 2015-08-20 ENCOUNTER — Other Ambulatory Visit: Payer: Self-pay | Admitting: *Deleted

## 2015-08-20 MED ORDER — SPIRONOLACTONE 25 MG PO TABS: ORAL_TABLET | ORAL | Status: DC

## 2015-08-20 NOTE — Telephone Encounter (Signed)
spironolactone (ALDACTONE) 25 MG tablet  Medication   Date: 07/27/2015  Department: [address] Office  Ordering/Authorizing: Wellington Hampshire, MD      Order Providers    Prescribing Provider Encounter Provider   Wellington Hampshire, MD Juventino Slovak, CMA    Medication Detail      Disp Refills Start End     spironolactone (ALDACTONE) 25 MG tablet 15 tablet 0 07/27/2015     Sig: Take one tablet by mouth daily--please schedule appointment for further refills [phone]    E-Prescribing Status: Receipt confirmed by pharmacy (07/27/2015 3:08 PM EDT)     [facility] [id number] - [place], [address]   Sending 3rd & final attempt, pt needs appt

## 2015-08-24 ENCOUNTER — Other Ambulatory Visit: Payer: Self-pay | Admitting: *Deleted

## 2015-08-24 DIAGNOSIS — I420 Dilated cardiomyopathy: Secondary | ICD-10-CM

## 2015-08-24 MED ORDER — METOPROLOL SUCCINATE ER 25 MG PO TB24: 25.0000 mg | ORAL_TABLET | Freq: Every day | ORAL | Status: DC

## 2015-08-25 DIAGNOSIS — R300 Dysuria: Secondary | ICD-10-CM | POA: Diagnosis not present

## 2015-09-01 ENCOUNTER — Encounter: Payer: Medicare Other | Admitting: Internal Medicine

## 2015-09-09 ENCOUNTER — Other Ambulatory Visit: Payer: Self-pay | Admitting: *Deleted

## 2015-09-09 NOTE — Telephone Encounter (Signed)
No refills without being seen especially for a medication like Spironolactone which requires lab monitoring.

## 2015-09-14 ENCOUNTER — Telehealth: Payer: Self-pay | Admitting: *Deleted

## 2015-09-14 NOTE — Telephone Encounter (Signed)
spironolactone (ALDACTONE) 25 MG tablet  Medication   Date: 08/20/2015  Department: [address] Office  Ordering/Authorizing: Wellington Hampshire, MD      Order Providers    Prescribing Provider Encounter Provider   Wellington Hampshire, MD Roberts Gaudy, CMA    Medication Detail      Disp Refills Start End     spironolactone (ALDACTONE) 25 MG tablet 15 tablet 0 08/20/2015     Sig: Take one tablet by mouth daily    Notes to Pharmacy: PT NEEDS APPT FOR FURTHER REFILLS, CALL [phone], 3RD ATTEMPT    E-Prescribing Status: Receipt confirmed by pharmacy (08/20/2015 12:37 PM EDT)     [facility] [address] - [place], [place] AT [place]

## 2015-09-15 DIAGNOSIS — C672 Malignant neoplasm of lateral wall of bladder: Secondary | ICD-10-CM | POA: Diagnosis not present

## 2015-09-16 ENCOUNTER — Other Ambulatory Visit: Payer: Self-pay | Admitting: Urology

## 2015-09-16 NOTE — Telephone Encounter (Signed)
Pt is past due for follow up with Dr. Caryl Comes and Dr. Fletcher Anon.  I placed call to pt to schedule these appointments.  Left message to call back. I also placed call to pharmacy to see if they have any additional numbers listed for her.  They do not.  I asked pharmacy to let pt know we are trying to reach her if she contacts them.

## 2015-09-17 MED ORDER — SPIRONOLACTONE 25 MG PO TABS: ORAL_TABLET | ORAL | Status: DC

## 2015-09-17 NOTE — Telephone Encounter (Signed)
F/u  Pt sched appt w/ Dr Caryl Comes for 12/18/15- wanted to see if she still needs to see Dr Fletcher Anon- wanted to confirm before making appt- Please call back and discuss.

## 2015-09-17 NOTE — Telephone Encounter (Signed)
I spoke with patient and she has already scheduled an appt with Dr Fletcher Anon.

## 2015-09-23 DIAGNOSIS — M545 Low back pain: Secondary | ICD-10-CM | POA: Diagnosis not present

## 2015-09-23 DIAGNOSIS — Z716 Tobacco abuse counseling: Secondary | ICD-10-CM | POA: Diagnosis not present

## 2015-09-23 DIAGNOSIS — K219 Gastro-esophageal reflux disease without esophagitis: Secondary | ICD-10-CM | POA: Diagnosis not present

## 2015-09-23 DIAGNOSIS — G894 Chronic pain syndrome: Secondary | ICD-10-CM | POA: Diagnosis not present

## 2015-09-25 ENCOUNTER — Encounter (HOSPITAL_COMMUNITY): Admission: RE | Admit: 2015-09-25 | Payer: Medicare Other | Source: Ambulatory Visit

## 2015-10-02 ENCOUNTER — Other Ambulatory Visit: Payer: Self-pay

## 2015-10-02 ENCOUNTER — Encounter (HOSPITAL_COMMUNITY): Payer: Self-pay

## 2015-10-02 ENCOUNTER — Encounter (HOSPITAL_COMMUNITY)
Admission: RE | Admit: 2015-10-02 | Discharge: 2015-10-02 | Disposition: A | Discharge status: Home / Self Care | Payer: Medicare Other | Source: Ambulatory Visit | Attending: Urology | Admitting: Urology

## 2015-10-02 DIAGNOSIS — I10 Essential (primary) hypertension: Secondary | ICD-10-CM | POA: Diagnosis not present

## 2015-10-02 DIAGNOSIS — K219 Gastro-esophageal reflux disease without esophagitis: Secondary | ICD-10-CM | POA: Diagnosis not present

## 2015-10-02 DIAGNOSIS — I509 Heart failure, unspecified: Secondary | ICD-10-CM | POA: Diagnosis not present

## 2015-10-02 DIAGNOSIS — I447 Left bundle-branch block, unspecified: Secondary | ICD-10-CM | POA: Insufficient documentation

## 2015-10-02 DIAGNOSIS — Z01812 Encounter for preprocedural laboratory examination: Secondary | ICD-10-CM | POA: Diagnosis not present

## 2015-10-02 DIAGNOSIS — Z95810 Presence of automatic (implantable) cardiac defibrillator: Secondary | ICD-10-CM | POA: Insufficient documentation

## 2015-10-02 HISTORY — DX: Frequency of micturition: R35.0

## 2015-10-02 LAB — CBC
HEMATOCRIT: 40.4 % (ref 36.0–46.0)
HEMOGLOBIN: 13.5 g/dL (ref 12.0–15.0)
MCH: 30.8 pg (ref 26.0–34.0)
MCHC: 33.4 g/dL (ref 30.0–36.0)
MCV: 92 fL (ref 78.0–100.0)
Platelets: 209 10*3/uL (ref 150–400)
RBC: 4.39 MIL/uL (ref 3.87–5.11)
RDW: 13.7 % (ref 11.5–15.5)
WBC: 7.9 10*3/uL (ref 4.0–10.5)

## 2015-10-02 LAB — BASIC METABOLIC PANEL
Anion gap: 7 (ref 5–15)
BUN: 13 mg/dL (ref 6–20)
CHLORIDE: 95 mmol/L — AB (ref 101–111)
CO2: 27 mmol/L (ref 22–32)
CREATININE: 0.55 mg/dL (ref 0.44–1.00)
Calcium: 9.8 mg/dL (ref 8.9–10.3)
GFR calc Af Amer: >60 mL/min (ref >60)
GFR calc non Af Amer: >60 mL/min (ref >60)
Glucose, Bld: 97 mg/dL (ref 65–99)
Potassium: 4.1 mmol/L (ref 3.5–5.1)
Sodium: 129 mmol/L — ABNORMAL LOW (ref 135–145)

## 2015-10-02 NOTE — Pre-Procedure Instructions (Addendum)
Cardiac device sheet signed, on chart LOV cardiology 09-14-14 epic Last ICD device check 03-18-15  Spoke to Dr. Jillyn Hidden about pt's cardiac history and lack of cardiac clearance. We do have the cardiac device sheet complete and on the chart. No changes in pt's cardiac history in last year.  Dr. Jillyn Hidden states pt can move forward with surgery.

## 2015-10-02 NOTE — Patient Instructions (Addendum)
MANVI MANNIS  10/02/2015   Your procedure is scheduled on: 10/06/15  Report to [facility] Main  Entrance take [facility]  elevators to 3rd floor to  [place] at 7:00 AM.  Call this number if you have problems the morning of surgery [phone]   Remember: ONLY 1 PERSON MAY GO WITH YOU TO SHORT STAY TO GET  READY MORNING OF [place].  Do not eat food or drink liquids :After Midnight.     Take these medicines the morning of surgery with A SIP OF WATER: Metoprolol, Pantoprazole                               You may not have any metal on your body including hair pins and              piercings  Do not wear jewelry, make-up, lotions, powders or perfumes, deodorant             Do not wear nail polish.  Do not shave  48 hours prior to surgery.              Men may shave face and neck.   Do not bring valuables to the hospital. [place].  Contacts, dentures or bridgework may not be worn into surgery.  Leave suitcase in the car. After surgery it may be brought to your room.     Patients discharged the day of surgery will not be allowed to drive home.  Name and phone number of your driver: Geannie Risen [phone]               Please read over the following fact sheets you were given: _____________________________________________________________________             [facility] - Preparing for Surgery Before surgery, you can play an important role.  Because skin is not sterile, your skin needs to be as free of germs as possible.  You can reduce the number of germs on your skin by washing with CHG (chlorahexidine gluconate) soap before surgery.  CHG is an antiseptic cleaner which kills germs and bonds with the skin to continue killing germs even after washing. Please DO NOT use if you have an allergy to CHG or antibacterial soaps.  If your skin becomes reddened/irritated stop using the CHG and inform your  nurse when you arrive at Short Stay. Do not shave (including legs and underarms) for at least 48 hours prior to the first CHG shower.  You may shave your face/neck. Please follow these instructions carefully:  1.  Shower with CHG Soap the night before surgery and the  morning of Surgery.  2.  If you choose to wash your hair, wash your hair first as usual with your  normal  shampoo.  3.  After you shampoo, rinse your hair and body thoroughly to remove the  shampoo.                           4.  Use CHG as you would any other liquid soap.  You can apply chg directly  to the skin and wash  Gently with a scrungie or clean washcloth.  5.  Apply the CHG Soap to your body ONLY FROM THE NECK DOWN.   Do not use on face/ open                           Wound or open sores. Avoid contact with eyes, ears mouth and genitals (private parts).                       Wash face,  Genitals (private parts) with your normal soap.             6.  Wash thoroughly, paying special attention to the area where your surgery  will be performed.  7.  Thoroughly rinse your body with warm water from the neck down.  8.  DO NOT shower/wash with your normal soap after using and rinsing off  the CHG Soap.                9.  Pat yourself dry with a clean towel.            10.  Wear clean pajamas.            11.  Place clean sheets on your bed the night of your first shower and do not  sleep with pets. Day of Surgery : Do not apply any lotions/deodorants the morning of surgery.  Please wear clean clothes to the hospital/surgery center.  FAILURE TO FOLLOW THESE INSTRUCTIONS MAY RESULT IN THE CANCELLATION OF YOUR SURGERY PATIENT SIGNATURE_________________________________  NURSE SIGNATURE__________________________________  ________________________________________________________________________

## 2015-10-04 NOTE — H&P (Signed)
Office Visit Report     09/15/2015   --------------------------------------------------------------------------------   Bianca Reichmann. Anderson  MRN: [id number]  PRIMARY CARE:  [name]. Bianca Pro, MD  DOB: June 27, 1940, 75 year old Female  REFERRING:    SSN: [id number]  PROVIDER:  [name], M.D.    LOCATION:  [facility] [phone]   --------------------------------------------------------------------------------   CC: I have bladder cancer.  HPI: [name] is a 75 year-old female established patient who is here for bladder cancer.  Her problem was diagnosed approximately 08/19/2013. The bladder cancer was found because of blood in her urine.   Her bladder cancer was treated by removal with scope. Patient denies removal of the entire bladder, radiation, and chemotherapy.   Her last cysto was approximately 04/22/2015.   Last upper tract: Oct 2016 - bilateral RGP's  Last BCG: reintroduced BCG Jan 2017 x 6      ALLERGIES: No Allergies    MEDICATIONS: Hydrocodone-Acetaminophen 10-325 MG Oral Tablet Oral  Lisinopril 5 MG Oral Tablet Oral  Metoprolol Tartrate TABS Oral  MiraLax POWD 0 Oral  Pantoprazole Sodium 40 MG Oral Tablet Delayed Release Oral     GU PSH: Bladder Instill AntiCA Agent - 01/15/2015, 04/23/2014 Cystoscopy TURBT >5 cm - 04/23/2014, 02/03/2014, 12/17/2013 Cystoscopy TURBT 2-5 cm - 01/15/2015      PSH Notes: Bladder Injection Of Cancer Treatment, Cystoscopy With Fulguration Medium Lesion (2-5cm), Cystoscopy With Fulguration Large Lesion (Over 5cm), Bladder Injection Of Cancer Treatment, Cystoscopy With Fulguration Large Lesion (Over 5cm), Bladder Surgery, Back Surgery, Cystoscopy With Fulguration Large Lesion (Over 5cm)   NON-GU PSH: No Non-GU PSH    GU PMH: Bladder Cancer, overlapping sites, Malignant neoplasm of overlapping sites of bladder - 05/19/2015 Bladder Cancer Dome, Malignant neoplasm of dome of bladder - 12/10/2014 Bladder Cancer Lateral, Malignant  neoplasm of lateral wall of urinary bladder - 12/10/2014 Bladder Cancer Posterior, Malignant neoplasm of posterior wall of urinary bladder - 12/18/2013 Gross hematuria, Gross hematuria - 12/18/2013 Personal history of malignant neoplasm of bladder, History of bladder cancer - 12/18/2013 Urethral Cancer, 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/2013    NON-GU PMH: Encounter for general adult medical examination without abnormal findings, Encounter for preventive health examination - 03/25/2015 Bacteriuria, Bacteriuria, asymptomatic - 08/13/2014 Other constipation, Chronic constipation - 12/18/2013 Personal history of other diseases of the circulatory system, History of hypertension - 12/18/2013, History of cardiac arrhythmia, - 12/18/2013, History of cardiac disorder, - 12/18/2013 Personal history of other diseases of the digestive system, History of gastric ulcer - 12/18/2013 Personal history of other diseases of the musculoskeletal system and connective tissue, History of degenerative disc disease - 12/18/2013    FAMILY HISTORY: Colon Cancer - Runs In Family Death - Runs In Family liver cancer - Runs In Family Lung Cancer - Runs In Family   SOCIAL HISTORY: Marital Status: Widowed Current Smoking Status: Patient smokes.  Has never drank.  Does not drink caffeine.     Notes: Current every day smoker, Six children, Alcohol use, Widowed, Caffeine use   REVIEW OF SYSTEMS:    GU Review Female:   Patient reports frequent urination, hard to postpone urination, and get up at night to urinate. Patient denies leakage of urine, have to strain to urinate, stream starts and stops, burning /pain with urination, trouble starting your stream, and currently pregnant.  Gastrointestinal (Upper):   Patient denies nausea, vomiting, and indigestion/ heartburn.  Gastrointestinal (Lower):   Patient denies diarrhea and constipation.  Constitutional:   Patient denies  fever, night sweats, weight loss, and fatigue.  Skin:    Patient denies skin rash/ lesion and itching.  Eyes:   Patient denies blurred vision and double vision.  Ears/ Nose/ Throat:   Patient denies sore throat and sinus problems.  Hematologic/Lymphatic:   Patient denies swollen glands and easy bruising.  Cardiovascular:   Patient denies leg swelling and chest pains.  Respiratory:   Patient denies cough and shortness of breath.  Endocrine:   Patient denies excessive thirst.  Musculoskeletal:   Patient reports back pain. Patient denies joint pain.  Neurological:   Patient denies headaches and dizziness.  Psychologic:   Patient denies depression and anxiety.   VITAL SIGNS:      09/15/2015 03:12 PM  Weight 119 lb / 53.98 kg  BP 159/87 mmHg  Pulse 67 /min   MULTI-SYSTEM PHYSICAL EXAMINATION:    Constitutional: Well-nourished. No physical deformities. Normally developed. Good grooming.  Neck: Neck symmetrical, not swollen. Normal tracheal position.  Respiratory: No labored breathing, no use of accessory muscles.   Cardiovascular: Normal temperature, normal extremity pulses, no swelling, no varicosities.  Neurologic / Psychiatric: Oriented to time, oriented to place, oriented to person. No depression, no anxiety, no agitation.     PAST DATA REVIEWED:  Source Of History:  Patient   PROCEDURES:         Flexible Cystoscopy - 52000  Risks, benefits, and some of the potential complications of the procedure were discussed at length with the patient including infection, bleeding, voiding discomfort, urinary retention, fever, chills, sepsis, and others. All questions were answered. Informed consent was obtained. Antibiotic prophylaxis was given. Sterile technique and intraurethral analgesia were used. Chaperone - Brenda  Meatus:  Normal size. Normal location. Normal condition.  Urethra:  No hypermobility. No leakage.  Ureteral Orifices:  Normal location. Normal size. Normal shape. Effluxed clear urine.  Bladder:  Moderate trabeculation. A few dome  tumors - small recurrence. Normal mucosa. No stones.      The lower urinary tract was carefully examined. The procedure was well-tolerated and without complications. Antibiotic instructions were given. Instructions were given to call the office immediately for bloody urine, difficulty urinating, urinary retention, painful or frequent urination, fever, chills, nausea, vomiting or other illness. The patient stated that she understood these instructions and would comply with them.   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 The bladder looks as best as it has. There is just a small cluster of recurrence at the dome and this was in the location of the heaviest tumor burden in the past. Discussed with her the nature risks benefits and alternatives to cystoscopy with bladder biopsy and fulgurationand chemotherapy instillation in the PACU. She elects to proceed.   cc: Dr. Maceo Anderson     * Signed by [name], M.D. on 09/16/15 at 10:43 AM (EDT)*     The information contained in this medical record document is considered private and confidential patient information. This information can only be used for the medical diagnosis and/or medical services that are being provided by the patient's selected caregivers. This information can only be distributed outside of the patient's care if the patient agrees and signs waivers of authorization for this information  to be sent to an outside source or route.

## 2015-10-05 NOTE — Progress Notes (Signed)
Final EKg done 10/02/15- EPIC

## 2015-10-06 ENCOUNTER — Ambulatory Visit (HOSPITAL_COMMUNITY)
Admission: RE | Admit: 2015-10-06 | Discharge: 2015-10-06 | Disposition: A | Discharge status: Home / Self Care | Payer: Medicare Other | Source: Ambulatory Visit | Attending: Urology | Admitting: Urology

## 2015-10-06 ENCOUNTER — Encounter (HOSPITAL_COMMUNITY)
Admission: RE | Disposition: A | Discharge status: Home / Self Care | Payer: Self-pay | Source: Ambulatory Visit | Attending: Urology

## 2015-10-06 ENCOUNTER — Ambulatory Visit (HOSPITAL_COMMUNITY): Payer: Medicare Other | Admitting: Anesthesiology

## 2015-10-06 ENCOUNTER — Encounter (HOSPITAL_COMMUNITY): Payer: Self-pay | Admitting: *Deleted

## 2015-10-06 DIAGNOSIS — I509 Heart failure, unspecified: Secondary | ICD-10-CM | POA: Insufficient documentation

## 2015-10-06 DIAGNOSIS — K219 Gastro-esophageal reflux disease without esophagitis: Secondary | ICD-10-CM | POA: Diagnosis not present

## 2015-10-06 DIAGNOSIS — J449 Chronic obstructive pulmonary disease, unspecified: Secondary | ICD-10-CM | POA: Insufficient documentation

## 2015-10-06 DIAGNOSIS — C674 Malignant neoplasm of posterior wall of bladder: Secondary | ICD-10-CM | POA: Diagnosis not present

## 2015-10-06 DIAGNOSIS — I110 Hypertensive heart disease with heart failure: Secondary | ICD-10-CM | POA: Diagnosis not present

## 2015-10-06 DIAGNOSIS — I10 Essential (primary) hypertension: Secondary | ICD-10-CM | POA: Diagnosis not present

## 2015-10-06 DIAGNOSIS — F17200 Nicotine dependence, unspecified, uncomplicated: Secondary | ICD-10-CM | POA: Diagnosis not present

## 2015-10-06 DIAGNOSIS — C679 Malignant neoplasm of bladder, unspecified: Secondary | ICD-10-CM | POA: Diagnosis not present

## 2015-10-06 DIAGNOSIS — K5909 Other constipation: Secondary | ICD-10-CM | POA: Insufficient documentation

## 2015-10-06 DIAGNOSIS — Z79899 Other long term (current) drug therapy: Secondary | ICD-10-CM | POA: Insufficient documentation

## 2015-10-06 HISTORY — PX: CYSTOSCOPY WITH BIOPSY: SHX5122

## 2015-10-06 SURGERY — CYSTOSCOPY, WITH BIOPSY
Anesthesia: General | Site: Bladder

## 2015-10-06 MED ORDER — LACTATED RINGERS IV SOLN
INTRAVENOUS | Status: DC
  Administered 2015-10-06: 08:00:00 via INTRAVENOUS

## 2015-10-06 MED ORDER — STERILE WATER FOR IRRIGATION IR SOLN
Status: DC | PRN
  Administered 2015-10-06: 3000 mL

## 2015-10-06 MED ORDER — PHENYLEPHRINE 40 MCG/ML (10ML) SYRINGE FOR IV PUSH (FOR BLOOD PRESSURE SUPPORT)
PREFILLED_SYRINGE | INTRAVENOUS | Status: AC
  Filled 2015-10-06: qty 10

## 2015-10-06 MED ORDER — PHENYLEPHRINE HCL 10 MG/ML IJ SOLN
INTRAMUSCULAR | Status: DC | PRN
  Administered 2015-10-06 (×2): 40 ug via INTRAVENOUS

## 2015-10-06 MED ORDER — BELLADONNA ALKALOIDS-OPIUM 16.2-60 MG RE SUPP
RECTAL | Status: AC
  Filled 2015-10-06: qty 1

## 2015-10-06 MED ORDER — FENTANYL CITRATE (PF) 100 MCG/2ML IJ SOLN
INTRAMUSCULAR | Status: AC
  Filled 2015-10-06: qty 2

## 2015-10-06 MED ORDER — CEFAZOLIN SODIUM-DEXTROSE 2-4 GM/100ML-% IV SOLN
2.0000 g | INTRAVENOUS | Status: AC
  Administered 2015-10-06: 2 g via INTRAVENOUS

## 2015-10-06 MED ORDER — EPHEDRINE SULFATE 50 MG/ML IJ SOLN
INTRAMUSCULAR | Status: AC
  Filled 2015-10-06: qty 1

## 2015-10-06 MED ORDER — METOPROLOL SUCCINATE ER 25 MG PO TB24
25.0000 mg | ORAL_TABLET | Freq: Once | ORAL | Status: AC
  Administered 2015-10-06: 25 mg via ORAL
  Filled 2015-10-06: qty 1

## 2015-10-06 MED ORDER — MIDAZOLAM HCL 2 MG/2ML IJ SOLN: 0.5000 mg | Freq: Once | INTRAMUSCULAR | Status: DC | PRN

## 2015-10-06 MED ORDER — ONDANSETRON HCL 4 MG/2ML IJ SOLN
INTRAMUSCULAR | Status: AC
  Filled 2015-10-06: qty 2

## 2015-10-06 MED ORDER — PROPOFOL 10 MG/ML IV BOLUS
INTRAVENOUS | Status: AC
  Filled 2015-10-06: qty 60

## 2015-10-06 MED ORDER — PROPOFOL 10 MG/ML IV BOLUS
INTRAVENOUS | Status: DC | PRN
  Administered 2015-10-06: 80 mg via INTRAVENOUS

## 2015-10-06 MED ORDER — LIDOCAINE HCL (CARDIAC) 20 MG/ML IV SOLN
INTRAVENOUS | Status: AC
  Filled 2015-10-06: qty 5

## 2015-10-06 MED ORDER — LIDOCAINE HCL (CARDIAC) 20 MG/ML IV SOLN
INTRAVENOUS | Status: DC | PRN
  Administered 2015-10-06: 50 mg via INTRAVENOUS

## 2015-10-06 MED ORDER — LIDOCAINE HCL 2 % EX GEL
CUTANEOUS | Status: AC
  Filled 2015-10-06: qty 5

## 2015-10-06 MED ORDER — KETAMINE HCL 10 MG/ML IJ SOLN
INTRAMUSCULAR | Status: AC
  Filled 2015-10-06: qty 1

## 2015-10-06 MED ORDER — PROMETHAZINE HCL 25 MG/ML IJ SOLN
6.2500 mg | INTRAMUSCULAR | Status: DC | PRN
Start: 2015-10-06 — End: 2015-10-06

## 2015-10-06 MED ORDER — ONDANSETRON HCL 4 MG/2ML IJ SOLN
INTRAMUSCULAR | Status: DC | PRN
  Administered 2015-10-06: 4 mg via INTRAVENOUS

## 2015-10-06 MED ORDER — FENTANYL CITRATE (PF) 250 MCG/5ML IJ SOLN
INTRAMUSCULAR | Status: DC | PRN
  Administered 2015-10-06 (×2): 50 ug via INTRAVENOUS

## 2015-10-06 MED ORDER — SODIUM CHLORIDE 0.9 % IJ SOLN
INTRAMUSCULAR | Status: AC
  Filled 2015-10-06: qty 10

## 2015-10-06 MED ORDER — SODIUM CHLORIDE 0.9 % IV SOLN
50.0000 mg | Freq: Once | INTRAVENOUS | Status: AC
  Administered 2015-10-06: 50 mg via INTRAVESICAL
  Filled 2015-10-06: qty 25

## 2015-10-06 MED ORDER — BELLADONNA ALKALOIDS-OPIUM 16.2-60 MG RE SUPP
RECTAL | Status: DC | PRN
  Administered 2015-10-06: 1 via RECTAL

## 2015-10-06 MED ORDER — FENTANYL CITRATE (PF) 100 MCG/2ML IJ SOLN: 25.0000 ug | INTRAMUSCULAR | Status: DC | PRN

## 2015-10-06 MED ORDER — CEFAZOLIN SODIUM-DEXTROSE 2-4 GM/100ML-% IV SOLN
INTRAVENOUS | Status: AC
  Filled 2015-10-06: qty 100

## 2015-10-06 MED ORDER — MEPERIDINE HCL 50 MG/ML IJ SOLN: 6.2500 mg | INTRAMUSCULAR | Status: DC | PRN

## 2015-10-06 SURGICAL SUPPLY — 14 items
BAG URINE DRAINAGE (UROLOGICAL SUPPLIES) IMPLANT
BAG URO CATCHER STRL LF (MISCELLANEOUS) ×3 IMPLANT
CATH FOLEY 2WAY 5CC 16FR (CATHETERS) ×3
CATH URTH STD 16FR FL 2W DRN (CATHETERS) IMPLANT
DRSG TELFA 3X8 NADH (GAUZE/BANDAGES/DRESSINGS) ×3 IMPLANT
GLOVE BIOGEL M STRL SZ7.5 (GLOVE) ×3 IMPLANT
GOWN STRL REUS W/TWL XL LVL3 (GOWN DISPOSABLE) ×3 IMPLANT
LOOP CUT BIPOLAR 24F LRG (ELECTROSURGICAL) IMPLANT
MANIFOLD NEPTUNE II (INSTRUMENTS) ×3 IMPLANT
PACK CYSTO (CUSTOM PROCEDURE TRAY) ×3 IMPLANT
PAD DRESSING TELFA 3X8 NADH (GAUZE/BANDAGES/DRESSINGS) IMPLANT
TUBING CONNECTING 10 (TUBING) ×2 IMPLANT
TUBING CONNECTING 10' (TUBING) ×1
WATER STERILE IRR 1000ML POUR (IV SOLUTION) ×2 IMPLANT

## 2015-10-06 NOTE — Transfer of Care (Signed)
Immediate Anesthesia Transfer of Care Note  Patient: [name]  Procedure(s) Performed: Procedure(s): CYSTO WITH BLADDER BIOPSY, FULGERATION, CHEMO IRRIGATION EPIRUBICIN IN PACU (N/A)  Patient Location: PACU  Anesthesia Type:General  Level of Consciousness: awake, alert  and oriented  Airway & Oxygen Therapy: Patient Spontanous Breathing  Post-op Assessment: Report given to RN and Post -op Vital signs reviewed and stable  Post vital signs: Reviewed and stable  Last Vitals:  Filed Vitals:   10/06/15 0722  BP: 124/86  Pulse: 60  Temp: 36.6 C  Resp: 16    Last Pain: There were no vitals filed for this visit.    Patients Stated Pain Goal: 3 ([id number])  Complications: No apparent anesthesia complications

## 2015-10-06 NOTE — Anesthesia Postprocedure Evaluation (Signed)
Anesthesia Post Note  Patient: CURLIE DELMUNDO  Procedure(s) Performed: Procedure(s) (LRB): CYSTO WITH BLADDER BIOPSY, FULGERATION, CHEMO IRRIGATION EPIRUBICIN IN PACU (N/A)  Patient location during evaluation: PACU Anesthesia Type: General Level of consciousness: awake and alert Pain management: pain level controlled Vital Signs Assessment: post-procedure vital signs reviewed and stable Respiratory status: spontaneous breathing, nonlabored ventilation, respiratory function stable and patient connected to nasal cannula oxygen Cardiovascular status: blood pressure returned to baseline and stable Postop Assessment: no signs of nausea or vomiting Anesthetic complications: no    Last Vitals:  Filed Vitals:   10/06/15 1130 10/06/15 1139  BP: 125/78 137/67  Pulse: 59 60  Temp: 37 C 36.8 C  Resp: 12 16    Last Pain:  Filed Vitals:   10/06/15 1141  PainSc: 0-No pain                 Lynsey Ange,JAMES TERRILL

## 2015-10-06 NOTE — Anesthesia Preprocedure Evaluation (Addendum)
Anesthesia Evaluation  Patient identified by MRN, date of birth, ID band Patient awake    Reviewed: Allergy & Precautions, NPO status , Patient's Chart, lab work & pertinent test results  Airway Mallampati: I  TM Distance: >3 FB Neck ROM: Full    Dental  (+) Edentulous Upper, Edentulous Lower   Pulmonary COPD, Current Smoker,    breath sounds clear to auscultation       Cardiovascular hypertension, Pt. on medications and Pt. on home beta blockers +CHF  + dysrhythmias + Cardiac Defibrillator  Rhythm:Regular  '15 ECHO: Severe global reduction in LV function, EF 0000000; grade 1 diastolic dysfunction; mild MR and TR; mildly elevated pulmonary pressures '15 Cath: non-obstructive   Neuro/Psych    GI/Hepatic PUD, GERD  Medicated,  Endo/Other    Renal/GU      Musculoskeletal   Abdominal   Peds  Hematology   Anesthesia Other Findings   Reproductive/Obstetrics                          Anesthesia Physical Anesthesia Plan  ASA: IV  Anesthesia Plan: General   Post-op Pain Management:    Induction: Intravenous  Airway Management Planned: LMA  Additional Equipment:   Intra-op Plan:   Post-operative Plan: Extubation in OR  Informed Consent: I have reviewed the patients History and Physical, chart, labs and discussed the procedure including the risks, benefits and alternatives for the proposed anesthesia with the patient or authorized representative who has indicated his/her understanding and acceptance.     Plan Discussed with:   Anesthesia Plan Comments:         Anesthesia Quick Evaluation

## 2015-10-06 NOTE — Discharge Instructions (Signed)
Cystoscopy, Care After  Refer to this sheet in the next few weeks. These instructions provide you with information on caring for yourself after your procedure. Your caregiver may also give you more specific instructions. Your treatment has been planned according to current medical practices, but problems sometimes occur. Call your caregiver if you have any problems or questions after your procedure.  HOME CARE INSTRUCTIONS    Things you can do to ease any discomfort after your procedure include:  · Drinking enough water and fluids to keep your urine clear or pale yellow.  · Taking a warm bath to relieve any burning feelings.  SEEK IMMEDIATE MEDICAL CARE IF:    · You have an increase in blood in your urine.  · You notice blood clots in your urine.  · You have difficulty passing urine.  · You have the chills.  · You have abdominal pain.  · You have a fever or persistent symptoms for more than 2-3 days.  · You have a fever and your symptoms suddenly get worse.  MAKE SURE YOU:    · Understand these instructions.  · Will watch your condition.  · Will get help right away if you are not doing well or get worse.     This information is not intended to replace advice given to you by your health care provider. Make sure you discuss any questions you have with your health care provider.     Document Released: 09/24/2004 Document Revised: 03/28/2014 Document Reviewed: 08/29/2011  Elsevier Interactive Patient Education ©2016 [employer]   

## 2015-10-06 NOTE — Op Note (Signed)
Preoperative diagnosis: Posterior bladder cancer Postoperative diagnosis: Same  Procedure: Cystoscopy with bladder biopsy and fulguration, 2-5 cm  Surgeon: Junious Silk  Anesthesia: Gen.  Indication for procedure: 75 year old with recurrent bladder tumors.  Findings: On exam under anesthesia the bladder was palpably normal. The urethra was palpably normal. Bimanual exam performed. The meatus appeared normal. There were no vaginal or introital lesions. On cystoscopy the urethra appeared normal, the trigone and ureteral orifices were normal with clear efflux. There was a cluster of posterior bladder tumors as well as one single right posterior bladder tumor. These were removed and biopsied with the rigid biopsy forceps and fulgurated. Overall there was a significant decrease in tumor burden compared to her prior cystoscopies.  Description of procedure: After consent was obtained patient brought to the operating room. After adequate anesthesia she was placed in lithotomy position. An exam under anesthesia was performed in a B&O suppository placed. She was prepped and draped in the usual sterile fashion. A timeout was performed to confirm the patient and procedure. The cystoscope was advanced per urethra and the bladder carefully inspected with the 30 and 70 lens. The 30 lens was replaced and using the rigid biopsy forceps the posterior tumors were biopsied and appeared superficial. The entire area was fulgurated of about 3 cm. Next the right posterior tumor was biopsied and this fulgurated. Hemostasis was excellent under low pressure. The bladder was refilled and the scope removed. A 16 French Foley catheter was placed.  Installation of chemotherapy and PACU: Urine was clear. 50 mg epirubicin/50 ml NS was instilled in bladder to remain indwelling for 50 minutes.   Complications: None  Blood loss: Minimal  Specimens to pathology: #1 posterior bladder biopsies, #2 right posterior biopsy  Drains: 16  French Foley  Disposition: stable to PACU

## 2015-10-06 NOTE — Interval H&P Note (Signed)
History and Physical Interval Note:  10/06/2015 9:10 AM  [name]  has presented today for surgery, with the diagnosis of BLADDER CANCER  The various methods of treatment have been discussed with the patient and family. After consideration of risks, benefits and other options for treatment, the patient has consented to  Procedure(s): CYSTO WITH BLADDER BIOPSY, FULGERATION, CHEMO IRRIGATION EPIRUBICIN IN PACU (N/A) as a surgical intervention .  The patient's history has been reviewed, patient examined, no change in status, stable for surgery.  I have reviewed the patient's chart and labs.  Questions were answered to the patient's satisfaction.  Pt is well. No fever or dysuria. Clear urine. Discussed risk of bladder perforation among others and possible need for prolonged foley.    Jessel Gettinger

## 2015-10-06 NOTE — Anesthesia Procedure Notes (Signed)
Procedure Name: LMA Insertion Date/Time: 10/06/2015 9:23 AM Performed by: Chandra Batch A Pre-anesthesia Checklist: Patient identified, Emergency Drugs available, Suction available, Patient being monitored and Timeout performed Patient Re-evaluated:Patient Re-evaluated prior to inductionOxygen Delivery Method: Circle system utilized Preoxygenation: Pre-oxygenation with 100% oxygen Intubation Type: IV induction Ventilation: Mask ventilation without difficulty LMA: LMA inserted LMA Size: 3.0 Number of attempts: 1 Placement Confirmation: positive ETCO2 and breath sounds checked- equal and bilateral Dental Injury: Teeth and Oropharynx as per pre-operative assessment

## 2015-10-18 ENCOUNTER — Other Ambulatory Visit: Payer: Self-pay | Admitting: Internal Medicine

## 2015-10-19 ENCOUNTER — Other Ambulatory Visit: Payer: Self-pay

## 2015-10-19 MED ORDER — SPIRONOLACTONE 25 MG PO TABS: ORAL_TABLET | ORAL | 1 refills | Status: DC

## 2015-10-20 ENCOUNTER — Ambulatory Visit (INDEPENDENT_AMBULATORY_CARE_PROVIDER_SITE_OTHER): Payer: Medicare Other | Admitting: Cardiovascular Disease

## 2015-10-20 ENCOUNTER — Encounter: Payer: Self-pay | Admitting: Cardiovascular Disease

## 2015-10-20 VITALS — BP 126/76 | HR 70 | Ht 68.0 in | Wt 121.6 lb

## 2015-10-20 DIAGNOSIS — Z720 Tobacco use: Secondary | ICD-10-CM

## 2015-10-20 DIAGNOSIS — M79605 Pain in left leg: Secondary | ICD-10-CM

## 2015-10-20 DIAGNOSIS — I420 Dilated cardiomyopathy: Secondary | ICD-10-CM

## 2015-10-20 DIAGNOSIS — I5022 Chronic systolic (congestive) heart failure: Secondary | ICD-10-CM | POA: Diagnosis not present

## 2015-10-20 DIAGNOSIS — M79604 Pain in right leg: Secondary | ICD-10-CM

## 2015-10-20 MED ORDER — METOPROLOL SUCCINATE ER 25 MG PO TB24: 25.0000 mg | ORAL_TABLET | Freq: Every day | ORAL | 8 refills | Status: DC

## 2015-10-20 MED ORDER — LISINOPRIL 5 MG PO TABS: 5.0000 mg | ORAL_TABLET | Freq: Every day | ORAL | 8 refills | Status: DC

## 2015-10-20 MED ORDER — SPIRONOLACTONE 25 MG PO TABS: ORAL_TABLET | ORAL | 8 refills | Status: DC

## 2015-10-20 NOTE — Patient Instructions (Signed)
Medication Instructions:  Your physician recommends that you continue on your current medications as directed. Please refer to the Current Medication list given to you today.  Labwork: No new orders.   Testing/Procedures: Your physician has requested that you have a lower extremity arterial doppler. This test is an ultrasound of the arteries in the legs. It looks at arterial blood flow in the legs. Allow one hour for Lower Arterial scans. There are no restrictions or special instructions  Follow-Up: Your physician wants you to follow-up in: 6 MONTHS with Dr Fletcher Anon.  You will receive a reminder letter in the mail two months in advance. If you don't receive a letter, please call our office to schedule the follow-up appointment.   Any Other Special Instructions Will Be Listed Below (If Applicable).     If you need a refill on your cardiac medications before your next appointment, please call your pharmacy.

## 2015-10-20 NOTE — Progress Notes (Signed)
Cardiology Office Note   Date:  10/20/2015   ID:  [name], DOB 1940/10/01, MRN [id number]  PCP:  Judge Stall, FNP  Cardiologist:   Kathlyn Sacramento, MD   Chief Complaint  Patient presents with  . Medication Refill    No currents Sx. Patients need refill on Spironolactone      History of Present Illness:  [name] is a 75 y.o. female who presents for a follow-up visit regarding chronic systolic heart failure due to nonischemic cardiomyopathy. She was hospitalized in September 2015 with nausea, vomiting, substernal chest pain and epigastric pain. She was found to have hyponatremia with severe anemia. She was noted to have frequent PVCs and PACs. Echocardiogram showed an ejection fraction of 20% with global hypokinesis, mild mitral regurgitation, mild pulmonary hypertension and small pericardial effusion. She underwent pharmacologic nuclear stress test which showed fixed anterior and inferior defects with no reversibility and ejection fraction of 20%.  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 She continued to have New York Heart Association class III symptoms . She underwent ICD CRT placement by Dr. Caryl Comes in [id number] without complications.  She had significant improvement in symptoms after that. She was diagnosed with bladder cancer around that time and has received treatment. She still getting localized immune therapy until now. She reports no orthopnea, PND or leg edema. No chest pain. She reports chronic back pain which limits her activities. She has mild bilateral leg discomfort with walking. She also noticed purple discoloration of both feet recently. No lower extremity ulceration. She quit smoking in 2015 but unfortunately she relapsed after few months. She was given nicotine patch and wants to try to quit again.  Past Medical History:  Diagnosis Date  . AICD (automatic  cardioverter/defibrillator) present   . Anemia   . Arthritis    "in about all my joints; for sure in my back"  . Bladder cancer ([place]) 1995; 2015  . CHF (congestive heart failure) ([place])   . Chronic lower back pain   . COPD (chronic obstructive pulmonary disease) ([place])   . Ectopic atrial beats   . Frequent urination   . GERD (gastroesophageal reflux disease)    h/o ulcers  . History of stomach ulcers 11/2013  . Hypertension   . LBBB (left bundle branch block)   . Migraines    "stopped before menopause"    Past Surgical History:  Procedure Laterality Date  . BACK SURGERY    . BI-VENTRICULAR IMPLANTABLE CARDIOVERTER DEFIBRILLATOR N/A 05/07/2014   Procedure: BI-VENTRICULAR IMPLANTABLE CARDIOVERTER DEFIBRILLATOR  (CRT-D);  Surgeon: Deboraha Sprang, MD;  Location: [facility] CATH LAB;  Service: Cardiovascular;  Laterality: N/A;  . CARDIAC CATHETERIZATION  ~ 02/2014  . CARDIAC DEFIBRILLATOR PLACEMENT  05/07/2014   dual chamber  . CATARACT EXTRACTION W/ INTRAOCULAR LENS  IMPLANT, BILATERAL Bilateral 12-2013 - 02-2014  . CYSTOSCOPY W/ RETROGRADES Bilateral 01/06/2015   Procedure: CYSTOSCOPY WITH  BLADDER BIOPSY BILATERAL RETROGRADE PYELOGRAM,INSTILLATION OF MITOMYCIN C;  Surgeon: [name], MD;  Location: [facility];  Service: Urology;  Laterality: Bilateral;  . CYSTOSCOPY WITH BIOPSY N/A 10/06/2015   Procedure: CYSTO WITH BLADDER BIOPSY, FULGERATION, CHEMO IRRIGATION EPIRUBICIN IN PACU;  Surgeon: [name], MD;  Location: [facility];  Service: Urology;  Laterality: N/A;  . CYSTOSCOPY WITH RETROGRADE PYELOGRAM, URETEROSCOPY AND STENT PLACEMENT Bilateral 04/18/2014   Procedure: CYSTOSCOPY WITH RETROGRADE PYELOGRAM;  Surgeon: [name], MD;  Location: [facility];  Service: Urology;  Laterality: Bilateral;  . ESOPHAGOGASTRODUODENOSCOPY N/A 11/28/2013   Procedure: ESOPHAGOGASTRODUODENOSCOPY (EGD);  Surgeon: Arta Silence, MD;  Location[facility] ENDOSCOPY;  Service: Endoscopy;  Laterality: N/A;  . LUMBAR  [place]  1980's   "ruptured disc"  . TRANSURETHRAL RESECTION OF BLADDER TUMOR N/A 12/13/2013   Procedure: TRANSURETHRAL RESECTION OF BLADDER TUMOR (TURBT);  Surgeon: [name], MD;  Location: [facility];  Service: Urology;  Laterality: N/A;  . TRANSURETHRAL RESECTION OF BLADDER TUMOR N/A 01/17/2014   Procedure: TRANSURETHRAL RESECTION OF BLADDER TUMOR (TURBT);  Surgeon: [name], MD;  Location: [facility];  Service: Urology;  Laterality: N/A;  . TRANSURETHRAL RESECTION OF BLADDER TUMOR N/A 04/18/2014   Procedure: TRANSURETHRAL RESECTION OF BLADDER TUMOR (TURBT), CYSTOGRAM;  Surgeon: [name], MD;  Location: [facility];  Service: Urology;  Laterality: N/A;     Current Outpatient Prescriptions  Medication Sig Dispense Refill  . HYDROcodone-acetaminophen (NORCO) 10-325 MG per tablet Take 1 tablet by mouth every 6 (six) hours as needed (Pain).    Marland Kitchen lisinopril (PRINIVIL,ZESTRIL) 5 MG tablet Take 5 mg by mouth daily.     . metoprolol succinate (TOPROL-XL) 25 MG 24 hr tablet Take 1 tablet (25 mg total) by mouth daily. 30 tablet 1  . Multiple Vitamin (MULTIVITAMIN WITH MINERALS) TABS tablet Take 1 tablet by mouth daily.    . pantoprazole (PROTONIX) 40 MG tablet Take 40 mg by mouth daily.     . polyethylene glycol (MIRALAX / GLYCOLAX) packet Take 17 g by mouth daily as needed for mild constipation.     Marland Kitchen spironolactone (ALDACTONE) 25 MG tablet Take one tablet by mouth daily 30 tablet 1   No current facility-administered medications for this visit.     Allergies:   Review of patient's allergies indicates no known allergies.    Social History:  The patient  reports that she has been smoking Cigarettes.  She has a 27.50 pack-year smoking history. She has never used smokeless tobacco. She reports that she does not drink alcohol or use drugs.   Family History:  The patient's family history includes Cancer in her brother and mother.    ROS:  Please see the history of present illness.    Otherwise, review of systems are positive for none.   All other systems are reviewed and negative.    PHYSICAL EXAM: VS:  BP 126/76   Pulse 70   Ht 5\' 8"  (1.727 m)   Wt 121 lb 9.6 oz (55.2 kg)   BMI 18.49 kg/m  , BMI Body mass index is 18.49 kg/m. GEN: Well nourished, well developed, in no acute distress  HEENT: normal  Neck: no JVD, carotid bruits, or masses Cardiac: RRR; no murmurs, rubs, or gallops,no edema  Respiratory:  clear to auscultation bilaterally, normal work of breathing GI: soft, nontender, nondistended, + BS MS: no deformity or atrophy  Skin: warm and dry, no rash Neuro:  Strength and sensation are intact Psych: euthymic mood, full affect Vascular: Femoral pulses +2 on the right side and +1 on the left. Dorsalis pedis is +2 bilaterally. Posterior tibial is absent bilaterally.  EKG:  EKG is ordered today. Recent EKG was reviewed which showed atrial sensed ventricular paced rhythm.   Recent Labs: 10/02/2015: BUN 13; Creatinine, Ser 0.55; Hemoglobin 13.5; Platelets 209; Potassium 4.1; Sodium 129    Lipid Panel    Component Value Date/Time   CHOL 175 08/31/2010 1740   TRIG 67 08/31/2010 1740   HDL 52 08/31/2010 1740  CHOLHDL 3.4 08/31/2010 1740   VLDL 13 08/31/2010 1740   LDLCALC 110 (H) 08/31/2010 1740      Wt Readings from Last 3 Encounters:  10/20/15 121 lb 9.6 oz (55.2 kg)  10/06/15 121 lb (54.9 kg)  10/02/15 121 lb 9.6 oz (55.2 kg)       ASSESSMENT AND PLAN:  1.  Chronic systolic heart failure: Due to nonischemic cardiomyopathy. Currently New York Heart Association class II. She is on optimal medical therapy. Recent basic metabolic profile showed normal potassium and kidney function. Continue same medications.  2. Mild bilateral leg pain: She does have chronic lower back pain with previous surgeries but she has diminished pulses distally and thus I requested lower extremity arterial Doppler.  3. Tobacco use: I had a prolonged discussion with  her about the importance of smoking cessation.  4. Status post ICD/CRT placement: She is scheduled to see Dr. Caryl Comes in September.  Disposition:   FU with me in 6 months  Signed,  Kathlyn Sacramento, MD  10/20/2015 10:21 AM    [place]

## 2015-10-21 ENCOUNTER — Other Ambulatory Visit: Payer: Self-pay | Admitting: Cardiovascular Disease

## 2015-10-21 DIAGNOSIS — I739 Peripheral vascular disease, unspecified: Secondary | ICD-10-CM

## 2015-10-26 ENCOUNTER — Encounter (HOSPITAL_COMMUNITY): Payer: Medicare Other

## 2015-10-29 DIAGNOSIS — C672 Malignant neoplasm of lateral wall of bladder: Secondary | ICD-10-CM | POA: Diagnosis not present

## 2015-11-03 ENCOUNTER — Encounter (INDEPENDENT_AMBULATORY_CARE_PROVIDER_SITE_OTHER): Payer: Self-pay

## 2015-11-03 ENCOUNTER — Ambulatory Visit (HOSPITAL_COMMUNITY)
Admission: RE | Admit: 2015-11-03 | Discharge: 2015-11-03 | Disposition: A | Discharge status: Home / Self Care | Payer: Medicare Other | Source: Ambulatory Visit | Attending: Cardiovascular Disease | Admitting: Cardiovascular Disease

## 2015-11-03 DIAGNOSIS — R0989 Other specified symptoms and signs involving the circulatory and respiratory systems: Secondary | ICD-10-CM | POA: Diagnosis not present

## 2015-11-03 DIAGNOSIS — K219 Gastro-esophageal reflux disease without esophagitis: Secondary | ICD-10-CM | POA: Insufficient documentation

## 2015-11-03 DIAGNOSIS — J449 Chronic obstructive pulmonary disease, unspecified: Secondary | ICD-10-CM | POA: Diagnosis not present

## 2015-11-03 DIAGNOSIS — I739 Peripheral vascular disease, unspecified: Secondary | ICD-10-CM

## 2015-11-03 DIAGNOSIS — I110 Hypertensive heart disease with heart failure: Secondary | ICD-10-CM | POA: Diagnosis not present

## 2015-11-03 DIAGNOSIS — M79605 Pain in left leg: Secondary | ICD-10-CM

## 2015-11-03 DIAGNOSIS — M79604 Pain in right leg: Secondary | ICD-10-CM

## 2015-11-03 DIAGNOSIS — I509 Heart failure, unspecified: Secondary | ICD-10-CM | POA: Diagnosis not present

## 2015-11-11 ENCOUNTER — Encounter: Payer: Self-pay | Admitting: Cardiovascular Disease

## 2015-11-11 NOTE — Telephone Encounter (Signed)
New message ° ° ° ° ° ° °Pt returning nurse call  °

## 2015-11-11 NOTE — Telephone Encounter (Signed)
This encounter was created in error - please disregard.

## 2015-11-17 DIAGNOSIS — C672 Malignant neoplasm of lateral wall of bladder: Secondary | ICD-10-CM | POA: Diagnosis not present

## 2015-11-17 DIAGNOSIS — Z5111 Encounter for antineoplastic chemotherapy: Secondary | ICD-10-CM | POA: Diagnosis not present

## 2015-11-24 DIAGNOSIS — C672 Malignant neoplasm of lateral wall of bladder: Secondary | ICD-10-CM | POA: Diagnosis not present

## 2015-11-24 DIAGNOSIS — C671 Malignant neoplasm of dome of bladder: Secondary | ICD-10-CM | POA: Diagnosis not present

## 2015-11-24 DIAGNOSIS — Z5111 Encounter for antineoplastic chemotherapy: Secondary | ICD-10-CM | POA: Diagnosis not present

## 2015-12-01 DIAGNOSIS — Z5111 Encounter for antineoplastic chemotherapy: Secondary | ICD-10-CM | POA: Diagnosis not present

## 2015-12-01 DIAGNOSIS — C672 Malignant neoplasm of lateral wall of bladder: Secondary | ICD-10-CM | POA: Diagnosis not present

## 2015-12-18 ENCOUNTER — Encounter: Payer: Self-pay | Admitting: Internal Medicine

## 2015-12-18 ENCOUNTER — Ambulatory Visit (INDEPENDENT_AMBULATORY_CARE_PROVIDER_SITE_OTHER): Payer: Medicare Other | Admitting: Internal Medicine

## 2015-12-18 VITALS — BP 136/90 | HR 77 | Ht 68.0 in | Wt 121.4 lb

## 2015-12-18 DIAGNOSIS — I447 Left bundle-branch block, unspecified: Secondary | ICD-10-CM

## 2015-12-18 DIAGNOSIS — I5022 Chronic systolic (congestive) heart failure: Secondary | ICD-10-CM

## 2015-12-18 DIAGNOSIS — I428 Other cardiomyopathies: Secondary | ICD-10-CM

## 2015-12-18 DIAGNOSIS — R001 Bradycardia, unspecified: Secondary | ICD-10-CM

## 2015-12-18 DIAGNOSIS — I429 Cardiomyopathy, unspecified: Secondary | ICD-10-CM | POA: Diagnosis not present

## 2015-12-18 DIAGNOSIS — Z95810 Presence of automatic (implantable) cardiac defibrillator: Secondary | ICD-10-CM

## 2015-12-18 NOTE — Progress Notes (Signed)
Electrophysiology Office Note   Date:  12/18/2015   ID:  [name], DOB February 18, 1941, MRN [id number]  PCP:  Judge Stall, FNP  Cardiologist:  MA Primary Electrophysiologist:  Virl Axe, MD    No chief complaint on file.    History of Present Illness: [name] is a 75 y.o. female seen in followup  for CRT-D implanted 2/16.   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 She also had tracings in the hospital with the operating QRS in lead V1 suggesting alternating bundle branch block; however those times, the limb leads still demonstrated left bundle branch block  Catheterization from12/15 reviewed>> nonobstructive disease    Patient is much improved following CRT-D.    Today, she denies symptoms of palpitations, chest pain,   orthopnea, PND, lower extremity edema, claudication, dizziness, presyncope, syncope, bleeding, or neurologic sequela. She denies shortness of breath.  Her biggest complaint however is that her feet are purple. She notices it after she is up and puts her feet down. She saw Dr. Gaylyn Cheers ABIs normal    The patient is tolerating medications without difficulties and is otherwise without complaint today.   7/17 Cr 0.55  K4.1   Past Medical History:  Diagnosis Date  . AICD (automatic cardioverter/defibrillator) present   . Anemia   . Arthritis    "in about all my joints; for sure in my back"  . Bladder cancer ([place]) 1995; 2015  . CHF (congestive heart failure) ([place])   . Chronic lower back pain   . COPD (chronic obstructive pulmonary disease) ([place])   . Ectopic atrial beats   . Frequent urination   . GERD (gastroesophageal reflux disease)    h/o ulcers  . History of stomach ulcers 11/2013  . Hypertension   . LBBB (left bundle branch block)   . Migraines    "stopped before menopause"   Past Surgical History:  Procedure Laterality Date  . BACK SURGERY    . BI-VENTRICULAR IMPLANTABLE  CARDIOVERTER DEFIBRILLATOR N/A 05/07/2014   Procedure: BI-VENTRICULAR IMPLANTABLE CARDIOVERTER DEFIBRILLATOR  (CRT-D);  Surgeon: Deboraha Sprang, MD;  Location[facility] CATH LAB;  Service: Cardiovascular;  Laterality: N/A;  . CARDIAC CATHETERIZATION  ~ 02/2014  . CARDIAC DEFIBRILLATOR PLACEMENT  05/07/2014   dual chamber  . CATARACT EXTRACTION W/ INTRAOCULAR LENS  IMPLANT, BILATERAL Bilateral 12-2013 - 02-2014  . CYSTOSCOPY W/ RETROGRADES Bilateral 01/06/2015   Procedure: CYSTOSCOPY WITH  BLADDER BIOPSY BILATERAL RETROGRADE PYELOGRAM,INSTILLATION OF MITOMYCIN C;  Surgeon: [name], MD;  Location: [facility];  Service: Urology;  Laterality: Bilateral;  . CYSTOSCOPY WITH BIOPSY N/A 10/06/2015   Procedure: CYSTO WITH BLADDER BIOPSY, FULGERATION, CHEMO IRRIGATION EPIRUBICIN IN PACU;  Surgeon: [name], MD;  Location: [facility];  Service: Urology;  Laterality: N/A;  . CYSTOSCOPY WITH RETROGRADE PYELOGRAM, URETEROSCOPY AND STENT PLACEMENT Bilateral 04/18/2014   Procedure: CYSTOSCOPY WITH RETROGRADE PYELOGRAM;  Surgeon: [name], MD;  Location: [facility];  Service: Urology;  Laterality: Bilateral;  . ESOPHAGOGASTRODUODENOSCOPY N/A 11/28/2013   Procedure: ESOPHAGOGASTRODUODENOSCOPY (EGD);  Surgeon: Arta Silence, MD;  Location[facility] ENDOSCOPY;  Service: Endoscopy;  Laterality: N/A;  . LUMBAR [place]  1980's   "ruptured disc"  . TRANSURETHRAL RESECTION OF BLADDER TUMOR N/A 12/13/2013   Procedure: TRANSURETHRAL RESECTION OF BLADDER TUMOR (TURBT);  Surgeon: [name], MD;  Location: [facility];  Service: Urology;  Laterality: N/A;  . TRANSURETHRAL RESECTION OF BLADDER TUMOR N/A 01/17/2014  Procedure: TRANSURETHRAL RESECTION OF BLADDER TUMOR (TURBT);  Surgeon: [name], MD;  Location: [facility];  Service: Urology;  Laterality: N/A;  . TRANSURETHRAL RESECTION OF BLADDER TUMOR N/A 04/18/2014   Procedure: TRANSURETHRAL RESECTION OF BLADDER TUMOR (TURBT), CYSTOGRAM;  Surgeon: [name], MD;   Location: [facility];  Service: Urology;  Laterality: N/A;     Current Outpatient Prescriptions  Medication Sig Dispense Refill  . HYDROcodone-acetaminophen (NORCO) 10-325 MG per tablet Take 1 tablet by mouth every 6 (six) hours as needed (Pain).    Marland Kitchen lisinopril (PRINIVIL,ZESTRIL) 5 MG tablet Take 1 tablet (5 mg total) by mouth daily. 30 tablet 8  . metoprolol succinate (TOPROL-XL) 25 MG 24 hr tablet Take 1 tablet (25 mg total) by mouth daily. 30 tablet 8  . Multiple Vitamin (MULTIVITAMIN WITH MINERALS) TABS tablet Take 1 tablet by mouth daily.    . pantoprazole (PROTONIX) 40 MG tablet Take 40 mg by mouth daily.     . polyethylene glycol (MIRALAX / GLYCOLAX) packet Take 17 g by mouth daily as needed for mild constipation.     Marland Kitchen spironolactone (ALDACTONE) 25 MG tablet Take one tablet by mouth daily 30 tablet 8   No current facility-administered medications for this visit.     Allergies:   Review of patient's allergies indicates no known allergies.   Social History:  The patient  reports that she has been smoking Cigarettes.  She has a 27.50 pack-year smoking history. She has never used smokeless tobacco. She reports that she does not drink alcohol or use drugs.   Family History:  The patient's *family history includes Cancer in her brother and mother.    ROS:  Please see the history of present illness.  .   All other systems are reviewed and negative.    PHYSICAL EXAM: VS:  BP 136/90   Pulse 77   Ht 5\' 8"  (1.727 m)   Wt 121 lb 6.4 oz (55.1 kg)   SpO2 99%   BMI 18.46 kg/m  , BMI Body mass index is 18.46 kg/m. GEN: Well nourished, well developed, in no acute distress  HEENT: normal  Neck: JVD 5-6 cm, carotid bruits, or masses Cardiac: R RR; 2/6  murmur  rubs, + S4 Respiratory:  clear to auscultation bilaterally, normal work of breathing Back without kyphosis or CVAT GI: soft, nontender, nondistended, + BS MS: no deformity or atrophy  Skin: warm and dry,   Extremities No  Clubbing  Edema DEpendent rubor, capillary refill> 3sec, cool to touch  Neuro:  Strength and sensation are intact Psych: euthymic mood, full affect  EKG:  EKG is ordered today. P-synchronous/ AV  pacing With Bi V pacing    Recent Labs: 10/02/2015: BUN 13; Creatinine, Ser 0.55; Hemoglobin 13.5; Platelets 209; Potassium 4.1; Sodium 129    Lipid Panel     Component Value Date/Time   CHOL 175 08/31/2010 1740   TRIG 67 08/31/2010 1740   HDL 52 08/31/2010 1740   CHOLHDL 3.4 08/31/2010 1740   VLDL 13 08/31/2010 1740   LDLCALC 110 (H) 08/31/2010 1740     Wt Readings from Last 3 Encounters:  12/18/15 121 lb 6.4 oz (55.1 kg)  10/20/15 121 lb 9.6 oz (55.2 kg)  10/06/15 121 lb (54.9 kg)      Other studies Reviewed: Additional studies/ records that were reviewed today include: cath   Review of the above records today demonstrates: as above   ASSESSMENT AND PLAN:  Cardiomyopathy nonischemic  Congestive heart failure-chronic systolic  COPD/emphysema  Hypertension  Left bundle branch block  Sinus bradycardia-borderline  CRT-D  Dependenet rubor  She is improved following CRT-D. She is euvolemic0  Her blood pressure is well controlled   Reassess LV function following CRT  I reached out to Dr. Gaylyn Cheers to discuss with him thoughts regarding her feet.    Current medicines are reviewed at length with the patient today.   The patient does not have concerns regarding her medicines.  The following changes were made today:  none  Labs/ tests ordered today include:    No orders of the defined types were placed in this encounter.    Disposition:   FU with me 3 month(s)   Signed, Virl Axe, MD  12/18/2015 3:10 PM     [place] [address] [phone] (office) [phone] (fax)

## 2015-12-18 NOTE — Patient Instructions (Addendum)
Medication Instructions: - Your physician recommends that you continue on your current medications as directed. Please refer to the Current Medication list given to you today.  Labwork: - none ordered  Procedures/Testing: - Your physician has requested that you have an echocardiogram. Echocardiography is a painless test that uses sound waves to create images of your heart. It provides your doctor with information about the size and shape of your heart and how well your heart's chambers and valves are working. This procedure takes approximately one hour. There are no restrictions for this procedure.  Follow-Up: - Remote monitoring is used to monitor your Pacemaker of ICD from home. This monitoring reduces the number of office visits required to check your device to one time per year. It allows [place] to keep an eye on the functioning of your device to ensure it is working properly. You are scheduled for a device check from home on 03/22/16. You may send your transmission at any time that day. If you have a wireless device, the transmission will be sent automatically. After your physician reviews your transmission, you will receive a postcard with your next transmission date.  - Your physician wants you to follow-up in: 1 year with Dr. Caryl Comes. You will receive a reminder letter in the mail two months in advance. If you don't receive a letter, please call our office to schedule the follow-up appointment.  Any Additional Special Instructions Will Be Listed Below (If Applicable).     If you need a refill on your cardiac medications before your next appointment, please call your pharmacy.

## 2015-12-22 LAB — CUP PACEART INCLINIC DEVICE CHECK
Brady Statistic AP VP Percent: 31.5 %
Brady Statistic AS VP Percent: 66.66 %
Brady Statistic AS VS Percent: 1.3 %
HighPow Impedance: 69 Ohm
Implantable Lead Implant Date: 20160217
Implantable Lead Implant Date: 20160217
Implantable Lead Model: 5076
Lead Channel Impedance Value: 342 Ohm
Lead Channel Impedance Value: 361 Ohm
Lead Channel Impedance Value: 399 Ohm
Lead Channel Impedance Value: 456 Ohm
Lead Channel Impedance Value: 456 Ohm
Lead Channel Impedance Value: 551 Ohm
Lead Channel Pacing Threshold Amplitude: 0.625 V
Lead Channel Pacing Threshold Amplitude: 1.125 V
Lead Channel Pacing Threshold Pulse Width: 0.4 ms
Lead Channel Pacing Threshold Pulse Width: 0.4 ms
Lead Channel Setting Pacing Pulse Width: 0.4 ms
Lead Channel Setting Sensing Sensitivity: 0.3 mV
MDC IDC LEAD IMPLANT DT: 20160217
MDC IDC LEAD LOCATION: 753858
MDC IDC LEAD LOCATION: 753859
MDC IDC LEAD LOCATION: 753860
MDC IDC LEAD MODEL: 4298
MDC IDC MSMT BATTERY REMAINING LONGEVITY: 74 mo
MDC IDC MSMT BATTERY VOLTAGE: 2.98 V
MDC IDC MSMT LEADCHNL LV IMPEDANCE VALUE: 361 Ohm
MDC IDC MSMT LEADCHNL LV IMPEDANCE VALUE: 551 Ohm
MDC IDC MSMT LEADCHNL LV IMPEDANCE VALUE: 608 Ohm
MDC IDC MSMT LEADCHNL LV IMPEDANCE VALUE: 608 Ohm
MDC IDC MSMT LEADCHNL LV IMPEDANCE VALUE: 646 Ohm
MDC IDC MSMT LEADCHNL LV PACING THRESHOLD PULSEWIDTH: 0.4 ms
MDC IDC MSMT LEADCHNL RA PACING THRESHOLD AMPLITUDE: 0.625 V
MDC IDC MSMT LEADCHNL RA SENSING INTR AMPL: 4.5 mV
MDC IDC MSMT LEADCHNL RV IMPEDANCE VALUE: 399 Ohm
MDC IDC MSMT LEADCHNL RV IMPEDANCE VALUE: 475 Ohm
MDC IDC MSMT LEADCHNL RV SENSING INTR AMPL: 21.875 mV
MDC IDC SESS DTM: 20170929191454
MDC IDC SET LEADCHNL LV PACING AMPLITUDE: 2.25 V
MDC IDC SET LEADCHNL RA PACING AMPLITUDE: 2 V
MDC IDC SET LEADCHNL RV PACING AMPLITUDE: 2.5 V
MDC IDC SET LEADCHNL RV PACING PULSEWIDTH: 0.4 ms
MDC IDC STAT BRADY AP VS PERCENT: 0.54 %
MDC IDC STAT BRADY RA PERCENT PACED: 32.04 %
MDC IDC STAT BRADY RV PERCENT PACED: 94.55 %

## 2015-12-24 DIAGNOSIS — M545 Low back pain: Secondary | ICD-10-CM | POA: Diagnosis not present

## 2015-12-24 DIAGNOSIS — G894 Chronic pain syndrome: Secondary | ICD-10-CM | POA: Diagnosis not present

## 2015-12-24 DIAGNOSIS — F17200 Nicotine dependence, unspecified, uncomplicated: Secondary | ICD-10-CM | POA: Diagnosis not present

## 2015-12-24 DIAGNOSIS — Z23 Encounter for immunization: Secondary | ICD-10-CM | POA: Diagnosis not present

## 2015-12-31 ENCOUNTER — Ambulatory Visit (HOSPITAL_COMMUNITY): Payer: Medicare Other | Attending: Internal Medicine

## 2015-12-31 ENCOUNTER — Other Ambulatory Visit: Payer: Self-pay

## 2015-12-31 DIAGNOSIS — I5022 Chronic systolic (congestive) heart failure: Secondary | ICD-10-CM | POA: Diagnosis not present

## 2016-01-04 ENCOUNTER — Telehealth: Payer: Self-pay | Admitting: Internal Medicine

## 2016-01-04 NOTE — Telephone Encounter (Signed)
New Message ° °Pt voiced she is returning nurses call. ° °Please f/u °

## 2016-01-04 NOTE — Telephone Encounter (Signed)
See most recent ECHO result note.

## 2016-02-24 DIAGNOSIS — C672 Malignant neoplasm of lateral wall of bladder: Secondary | ICD-10-CM | POA: Diagnosis not present

## 2016-02-29 ENCOUNTER — Telehealth: Payer: Self-pay | Admitting: Family Medicine

## 2016-02-29 NOTE — Telephone Encounter (Signed)
Ok to re-establish 

## 2016-02-29 NOTE — Telephone Encounter (Signed)
Pt called and stated that she saw Dr. Derrel Nip back in 2015. Her Daughter Jacquiline Doe spoke with Dr. Derrel Nip in regards to having pt re-establish. Please advise, thank you!  Pt also would like to know if we have her blood type on file.   Call pt @ [phone]

## 2016-02-29 NOTE — Telephone Encounter (Signed)
OK TO RESTABLISH,  I DO NOT HAVE HER BLOOD TYPE ON FILE

## 2016-03-01 NOTE — Telephone Encounter (Signed)
Call patient to re-establish, patient is aware we do not have blood type on file.

## 2016-03-22 ENCOUNTER — Ambulatory Visit (INDEPENDENT_AMBULATORY_CARE_PROVIDER_SITE_OTHER): Payer: Medicare Other | Admitting: *Deleted

## 2016-03-22 DIAGNOSIS — I5022 Chronic systolic (congestive) heart failure: Secondary | ICD-10-CM

## 2016-03-25 ENCOUNTER — Encounter: Payer: Self-pay | Admitting: Cardiology

## 2016-03-25 LAB — CUP PACEART REMOTE DEVICE CHECK
Battery Voltage: 2.98 V
Brady Statistic AP VP Percent: 29.58 %
Brady Statistic AS VS Percent: 1.36 %
Date Time Interrogation Session: 20180102093824
HIGH POWER IMPEDANCE MEASURED VALUE: 77 Ohm
Implantable Lead Implant Date: 20160217
Implantable Lead Implant Date: 20160217
Implantable Lead Location: 753858
Implantable Lead Location: 753859
Implantable Lead Model: 4298
Implantable Pulse Generator Implant Date: 20160217
Lead Channel Impedance Value: 342 Ohm
Lead Channel Impedance Value: 418 Ohm
Lead Channel Impedance Value: 418 Ohm
Lead Channel Impedance Value: 589 Ohm
Lead Channel Impedance Value: 589 Ohm
Lead Channel Impedance Value: 608 Ohm
Lead Channel Impedance Value: 665 Ohm
Lead Channel Pacing Threshold Amplitude: 0.625 V
Lead Channel Pacing Threshold Amplitude: 0.625 V
Lead Channel Pacing Threshold Amplitude: 1 V
Lead Channel Pacing Threshold Pulse Width: 0.4 ms
Lead Channel Sensing Intrinsic Amplitude: 21.25 mV
Lead Channel Setting Pacing Amplitude: 2 V
Lead Channel Setting Pacing Amplitude: 2.5 V
Lead Channel Setting Pacing Pulse Width: 0.4 ms
Lead Channel Setting Sensing Sensitivity: 0.3 mV
MDC IDC LEAD IMPLANT DT: 20160217
MDC IDC LEAD LOCATION: 753860
MDC IDC MSMT BATTERY REMAINING LONGEVITY: 69 mo
MDC IDC MSMT LEADCHNL LV IMPEDANCE VALUE: 342 Ohm
MDC IDC MSMT LEADCHNL LV IMPEDANCE VALUE: 361 Ohm
MDC IDC MSMT LEADCHNL LV IMPEDANCE VALUE: 399 Ohm
MDC IDC MSMT LEADCHNL LV IMPEDANCE VALUE: 418 Ohm
MDC IDC MSMT LEADCHNL LV IMPEDANCE VALUE: 589 Ohm
MDC IDC MSMT LEADCHNL RA PACING THRESHOLD PULSEWIDTH: 0.4 ms
MDC IDC MSMT LEADCHNL RA SENSING INTR AMPL: 3.875 mV
MDC IDC MSMT LEADCHNL RA SENSING INTR AMPL: 3.875 mV
MDC IDC MSMT LEADCHNL RV IMPEDANCE VALUE: 513 Ohm
MDC IDC MSMT LEADCHNL RV PACING THRESHOLD PULSEWIDTH: 0.4 ms
MDC IDC MSMT LEADCHNL RV SENSING INTR AMPL: 21.25 mV
MDC IDC SET LEADCHNL RA PACING AMPLITUDE: 2 V
MDC IDC SET LEADCHNL RV PACING PULSEWIDTH: 0.4 ms
MDC IDC STAT BRADY AP VS PERCENT: 0.46 %
MDC IDC STAT BRADY AS VP PERCENT: 68.6 %
MDC IDC STAT BRADY RA PERCENT PACED: 29.72 %
MDC IDC STAT BRADY RV PERCENT PACED: 92.41 %

## 2016-03-25 NOTE — Progress Notes (Signed)
Letter  

## 2016-03-28 DIAGNOSIS — G894 Chronic pain syndrome: Secondary | ICD-10-CM | POA: Diagnosis not present

## 2016-03-28 DIAGNOSIS — M545 Low back pain: Secondary | ICD-10-CM | POA: Diagnosis not present

## 2016-04-07 ENCOUNTER — Ambulatory Visit: Payer: Medicare Other | Admitting: Internal Medicine

## 2016-05-13 ENCOUNTER — Ambulatory Visit (INDEPENDENT_AMBULATORY_CARE_PROVIDER_SITE_OTHER): Payer: Medicare Other | Admitting: Internal Medicine

## 2016-05-13 ENCOUNTER — Ambulatory Visit: Payer: Medicare Other

## 2016-05-13 ENCOUNTER — Encounter: Payer: Self-pay | Admitting: Internal Medicine

## 2016-05-13 VITALS — BP 160/90 | HR 69 | Temp 97.0°F | Resp 16 | Ht 67.5 in | Wt 123.0 lb

## 2016-05-13 DIAGNOSIS — G8929 Other chronic pain: Secondary | ICD-10-CM

## 2016-05-13 DIAGNOSIS — Z0000 Encounter for general adult medical examination without abnormal findings: Secondary | ICD-10-CM | POA: Diagnosis not present

## 2016-05-13 DIAGNOSIS — E871 Hypo-osmolality and hyponatremia: Secondary | ICD-10-CM | POA: Diagnosis not present

## 2016-05-13 DIAGNOSIS — K259 Gastric ulcer, unspecified as acute or chronic, without hemorrhage or perforation: Secondary | ICD-10-CM | POA: Diagnosis not present

## 2016-05-13 DIAGNOSIS — M4805 Spinal stenosis, thoracolumbar region: Secondary | ICD-10-CM

## 2016-05-13 DIAGNOSIS — C679 Malignant neoplasm of bladder, unspecified: Secondary | ICD-10-CM | POA: Diagnosis not present

## 2016-05-13 DIAGNOSIS — M545 Low back pain: Secondary | ICD-10-CM

## 2016-05-13 DIAGNOSIS — I447 Left bundle-branch block, unspecified: Secondary | ICD-10-CM

## 2016-05-13 MED ORDER — DULOXETINE HCL 30 MG PO CPEP: 30.0000 mg | ORAL_CAPSULE | Freq: Every day | ORAL | 2 refills | Status: DC

## 2016-05-13 NOTE — Patient Instructions (Addendum)
  [name] , Thank you for taking time to come for your Medicare Wellness Visit. I appreciate your ongoing commitment to your health goals. Please review the following plan we discussed and let me know if I can assist you in the future.   Follow up with Dr. Derrel Nip as needed.  These are the goals we discussed: Goals    . Increase physical activity    . Quit smoking / using tobacco       This is a list of the screening recommended for you and due dates:  Health Maintenance  Topic Date Due  . Tetanus Vaccine  03/21/1958  . DEXA scan (bone density measurement)  03/21/2004  . Pneumonia vaccines (2 of 2 - PPSV23) 12/28/2014  . Flu Shot  Completed

## 2016-05-13 NOTE — Progress Notes (Signed)
Subjective:   [name] is a 76 y.o. female who presents for an Initial Medicare Annual Wellness Visit.  Review of Systems    No ROS.  Medicare Wellness Visit.  Cardiac Risk Factors include: advanced age (>77men, >53 women)     Objective:    Today's Vitals   05/13/16 1507  BP: (!) 160/90  Pulse: 69  Resp: 16  Temp: 97 F (36.1 C)  SpO2: 99%  Weight: 123 lb (55.8 kg)  Height: 5' 7.5" (1.715 m)   Body mass index is 18.98 kg/m.   Current Medications (verified) Outpatient Encounter Prescriptions as of 05/13/2016  Medication Sig  . DULoxetine (CYMBALTA) 30 MG capsule Take 1 capsule (30 mg total) by mouth daily.  Marland Kitchen gabapentin (NEURONTIN) 300 MG capsule Take 1 capsule by mouth 2 (two) times daily.  Marland Kitchen HYDROcodone-acetaminophen (NORCO) 10-325 MG per tablet Take 1 tablet by mouth every 6 (six) hours as needed (Pain).  Marland Kitchen lisinopril (PRINIVIL,ZESTRIL) 5 MG tablet Take 1 tablet (5 mg total) by mouth daily.  . metoprolol succinate (TOPROL-XL) 25 MG 24 hr tablet Take 1 tablet (25 mg total) by mouth daily.  . Multiple Vitamin (MULTIVITAMIN WITH MINERALS) TABS tablet Take 1 tablet by mouth daily.  . pantoprazole (PROTONIX) 40 MG tablet Take 40 mg by mouth daily.   . polyethylene glycol (MIRALAX / GLYCOLAX) packet Take 17 g by mouth daily as needed for mild constipation.   Marland Kitchen spironolactone (ALDACTONE) 25 MG tablet Take one tablet by mouth daily   No facility-administered encounter medications on file as of 05/13/2016.     Allergies (verified) Patient has no known allergies.   History: Past Medical History:  Diagnosis Date  . AICD (automatic cardioverter/defibrillator) present   . Anemia   . Arthritis    "in about all my joints; for sure in my back"  . Bladder cancer ([place]) 1995; 2015  . CHF (congestive heart failure) ([place])   . Chronic lower back pain   . COPD (chronic obstructive pulmonary disease) ([place])   . Ectopic atrial beats   . Frequent urination   . GERD  (gastroesophageal reflux disease)    h/o ulcers  . History of stomach ulcers 11/2013  . Hypertension   . LBBB (left bundle branch block)   . Migraines    "stopped before menopause"   Past Surgical History:  Procedure Laterality Date  . BACK SURGERY    . BI-VENTRICULAR IMPLANTABLE CARDIOVERTER DEFIBRILLATOR N/A 05/07/2014   Procedure: BI-VENTRICULAR IMPLANTABLE CARDIOVERTER DEFIBRILLATOR  (CRT-D);  Surgeon: Deboraha Sprang, MD;  Location: [facility] CATH LAB;  Service: Cardiovascular;  Laterality: N/A;  . CARDIAC CATHETERIZATION  ~ 02/2014  . CARDIAC DEFIBRILLATOR PLACEMENT  05/07/2014   dual chamber  . CATARACT EXTRACTION W/ INTRAOCULAR LENS  IMPLANT, BILATERAL Bilateral 12-2013 - 02-2014  . CYSTOSCOPY W/ RETROGRADES Bilateral 01/06/2015   Procedure: CYSTOSCOPY WITH  BLADDER BIOPSY BILATERAL RETROGRADE PYELOGRAM,INSTILLATION OF MITOMYCIN C;  Surgeon: [name], MD;  Location: [facility];  Service: Urology;  Laterality: Bilateral;  . CYSTOSCOPY WITH BIOPSY N/A 10/06/2015   Procedure: CYSTO WITH BLADDER BIOPSY, FULGERATION, CHEMO IRRIGATION EPIRUBICIN IN PACU;  Surgeon: [name], MD;  Location: [facility];  Service: Urology;  Laterality: N/A;  . CYSTOSCOPY WITH RETROGRADE PYELOGRAM, URETEROSCOPY AND STENT PLACEMENT Bilateral 04/18/2014   Procedure: CYSTOSCOPY WITH RETROGRADE PYELOGRAM;  Surgeon: [name], MD;  Location: [facility];  Service: Urology;  Laterality: Bilateral;  . ESOPHAGOGASTRODUODENOSCOPY N/A 11/28/2013   Procedure: ESOPHAGOGASTRODUODENOSCOPY (EGD);  Surgeon: Arta Silence,  MD;  Location: [facility];  Service: Endoscopy;  Laterality: N/A;  . LUMBAR [place]  1980's   "ruptured disc"  . TRANSURETHRAL RESECTION OF BLADDER TUMOR N/A 12/13/2013   Procedure: TRANSURETHRAL RESECTION OF BLADDER TUMOR (TURBT);  Surgeon: [name], MD;  Location: [facility];  Service: Urology;  Laterality: N/A;  . TRANSURETHRAL RESECTION OF BLADDER TUMOR N/A 01/17/2014   Procedure:  TRANSURETHRAL RESECTION OF BLADDER TUMOR (TURBT);  Surgeon: [name], MD;  Location: [facility];  Service: Urology;  Laterality: N/A;  . TRANSURETHRAL RESECTION OF BLADDER TUMOR N/A 04/18/2014   Procedure: TRANSURETHRAL RESECTION OF BLADDER TUMOR (TURBT), CYSTOGRAM;  Surgeon: [name], MD;  Location: [facility];  Service: Urology;  Laterality: N/A;   Family History  Problem Relation Age of Onset  . Cancer Mother   . Cancer Brother    Social History   Occupational History  . Not on file.   Social History Main Topics  . Smoking status: Current Every Day Smoker    Packs/day: 0.50    Years: 55.00    Types: Cigarettes    Last attempt to quit: 11/22/2013  . Smokeless tobacco: Never Used  . Alcohol use No  . Drug use: No  . Sexual activity: No    Tobacco Counseling Ready to quit: Not Answered Counseling given: Not Answered   Activities of Daily Living In your present state of health, do you have any difficulty performing the following activities: 05/13/2016 10/02/2015  Hearing? N N  Vision? N N  Difficulty concentrating or making decisions? N N  Walking or climbing stairs? Y Y  Dressing or bathing? N N  Doing errands, shopping? N N  Preparing Food and eating ? N -  Using the Toilet? N -  In the past six months, have you accidently leaked urine? N -  Do you have problems with loss of bowel control? N -  Managing your Medications? N -  Managing your Finances? N -  Housekeeping or managing your Housekeeping? N -  Some recent data might be hidden    Immunizations and Health Maintenance Immunization History  Administered Date(s) Administered  . Influenza,inj,Quad PF,36+ Mos 12/27/2013  . Influenza-Unspecified 12/24/2015  . Pneumococcal Conjugate-13 12/27/2013   Health Maintenance Due  Topic Date Due  . TETANUS/TDAP  03/21/1958  . DEXA SCAN  03/21/2004  . PNA vac Low Risk Adult (2 of 2 - PPSV23) 12/28/2014    Patient Care Team: [name], MD as PCP - General  (Internal Medicine) Briscoe Deutscher, MD (Family Medicine)  Indicate any recent Medical Services you may have received from other than [facility] providers in the past year (date may be approximate).     Assessment:   This is a routine wellness examination for [place]burn. The goal of the wellness visit is to assist the patient how to close the gaps in care and create a preventative care plan for the patient.   Osteoporosis risk reviewed.  Medications reviewed; taking without issues or barriers.  Safety issues reviewed; smoke detectors in the home. No firearms in the home. Wears seatbelts when driving or riding with others. No violence in the home.  No identified risk were noted; The patient was oriented x 3; appropriate in dress and manner and no objective failures at ADL's or IADL's. Uses a walker when ambulating.  BMI; discussed the importance of a healthy diet, water intake and exercise. Educational material provided.  TDAP and PNA 23 vaccine deferred per patient request.  Educational material provided.  DEXA Scan discussed.  Educational material provided.  Patient Concerns: None at this time. Follow up with PCP as needed.  Hearing/Vision screen Hearing Screening Comments: Able to hear conversation tones without difficulty.  No issues reported. Vision Screening Comments: Cataract extraction, bilateral Wears corrective lenses for reading only Visual acuity not assessed per patient preference since she has regular follow up with her ophthalmologist  Dietary issues and exercise activities discussed: Current Exercise Habits: The patient does not participate in regular exercise at present  Goals    . Increase physical activity    . Quit smoking / using tobacco      Depression Screen PHQ 2/9 Scores 05/13/2016  PHQ - 2 Score 0    Fall Risk Fall Risk  05/13/2016  Falls in the past year? No    Cognitive Function:     6CIT Screen 05/13/2016  What Year? 0 points  What month? 0  points  What time? 0 points  Count back from 20 0 points  Months in reverse 0 points  Repeat phrase 0 points  Total Score 0    Screening Tests Health Maintenance  Topic Date Due  . TETANUS/TDAP  03/21/1958  . DEXA SCAN  03/21/2004  . PNA vac Low Risk Adult (2 of 2 - PPSV23) 12/28/2014  . INFLUENZA VACCINE  Completed      Plan:    End of life planning; Advance aging; Advanced directives discussed. Copy of current HCPOA/Living Will requested.  Medicare Attestation I have personally reviewed: The patient's medical and social history Their use of alcohol, tobacco or illicit drugs Their current medications and supplements The patient's functional ability including ADLs,fall risks, home safety risks, cognitive, and hearing and visual impairment Diet and physical activities Evidence for depression   The patient's weight, height, BMI, and visual acuity have been recorded in the chart.  I have made referrals and provided education to the patient based on review of the above and I have provided the patient with a written personalized care plan for preventive services.    During the course of the visit, Avianah was educated and counseled about the following appropriate screening and preventive services:   Vaccines to include Pneumoccal, Influenza, Hepatitis B, Td, Zostavax, HCV  Electrocardiogram  Cardiovascular disease screening  Colorectal cancer screening  Bone density screening  Diabetes screening  Glaucoma screening  Mammography/PAP  Nutrition counseling  Smoking cessation counseling  Patient Instructions (the written plan) were given to the patient.    Varney Biles, LPN   [id number]

## 2016-05-13 NOTE — Progress Notes (Signed)
Pre visit review using our clinic review tool, if applicable. No additional management support is needed unless otherwise documented below in the visit note. 

## 2016-05-13 NOTE — Patient Instructions (Addendum)
I would recommend a 2nd opinion on your spine surgery from a specialist at a tertiary care center such as [facility] or [facility]   We will need to repeat your MRI if we make a referral   I do recommend trying physical therapy to help with yoru balance and core strength   your blood pressure  Is above the currently recommended acceptable maximum  of 130/80, so I am recommending that you check your blood pressure at home using  your home monitor for  The  next two seeks and send me the readings   You need to quit smoking!!  Nobody will operate on you if you are smoking   We talked  a trial of cymbalta to help you manage your pain and your anxiety .  If you want to continue the medication or increase the dose,  Let me know

## 2016-05-13 NOTE — Progress Notes (Signed)
Subjective:  Patient ID: [name], female    DOB: [birth year]  Age: 76 y.o. MRN: [id number]  CC: Diagnoses of Spinal stenosis of thoracolumbar region, Hyponatremia, Malignant neoplasm of urinary bladder, unspecified site [facility]), Gastric ulcer, unspecified chronicity, unspecified whether gastric ulcer hemorrhage or perforation present, LBBB (left bundle branch block), and Chronic bilateral low back pain without sciatica were pertinent to this visit.  HPI [name] presents for REESTABLISHMENT OF CARE.  She was last seen in Nov 2015 .  IN the interim she has been seeing Dr Maceo Pro who has retired. She continues to see his nurse practitioner , Judge Stall, for chronic low back  pain secondary to spinal stenosis and receives her prescription for  Hydrocodone monthly from  Her. Refill history confirmed via [place] Controlled Substance databas, accessed by me today..  Dilated cardiomyopathy ef 25%, LBBB.  nonobstructive cardiac cath 12/15 S/p pacer Repeat echo in Oct 2017 showed that her EF had normalized   Has had abis' evaluated fore eval of lower extremity edema: normal   Bladder cancer jun 2015, recurrence by July 2017 cystoscopy  Tobacco abuse smoking again daily.  Smoking  in the house,  Quit for a year when she lived with her daughter to receive treatment for  bladder cancer,  Resumed once she moved back home,  Has patches, Does not want medications     Gastric ulcer 2015 egd    Cc:; persistent back pain has been present for over 3 years,  Saw Nudelman .  Has had MRI lumbar and cervical spine .  Had  2  ESI's: one from him ,  One by prior Pain Management specialist,  Neither relived her pain from more than a week. .  Has severe spinal stenosis in the L3 -L4 Her use of  vicodin  only makes it tolerable. Feels her balance is also getting worse.  Uses a walker outside of the home, not in the house,  Grabs on to the furniture at home as needed. Denies any history of falls. Has not had PT  although it was recommended by other physician.  Still Independent of ADLs but feels her legs are getting weaker   has not seen Nudelman in over a year  Willing to consider surgery but wants a second opinion because she did not feel that he was interested in doing surgery on her.   Hypertension takes 2 meds,  One is due at 3 pm         Outpatient Medications Prior to Visit  Medication Sig Dispense Refill  . HYDROcodone-acetaminophen (NORCO) 10-325 MG per tablet Take 1 tablet by mouth every 6 (six) hours as needed (Pain).    Marland Kitchen lisinopril (PRINIVIL,ZESTRIL) 5 MG tablet Take 1 tablet (5 mg total) by mouth daily. 30 tablet 8  . metoprolol succinate (TOPROL-XL) 25 MG 24 hr tablet Take 1 tablet (25 mg total) by mouth daily. 30 tablet 8  . Multiple Vitamin (MULTIVITAMIN WITH MINERALS) TABS tablet Take 1 tablet by mouth daily.    . pantoprazole (PROTONIX) 40 MG tablet Take 40 mg by mouth daily.     . polyethylene glycol (MIRALAX / GLYCOLAX) packet Take 17 g by mouth daily as needed for mild constipation.     Marland Kitchen spironolactone (ALDACTONE) 25 MG tablet Take one tablet by mouth daily 30 tablet 8   No facility-administered medications prior to visit.     Review of Systems;  Patient denies headache, fevers, malaise, unintentional weight loss, skin rash,  eye pain, sinus congestion and sinus pain, sore throat, dysphagia,  hemoptysis , cough, dyspnea, wheezing, chest pain, palpitations, orthopnea, edema, abdominal pain, nausea, melena, diarrhea, constipation, flank pain, dysuria, hematuria, urinary  Frequency, nocturia, numbness, tingling, seizures,  Focal weakness, Loss of consciousness,  Tremor, insomnia, depression, anxiety, and suicidal ideation.      Objective:  BP (!) 160/90   Pulse 69   Resp 16   Ht 5' 7.5" (1.715 m)   Wt 123 lb (55.8 kg)   SpO2 99%   BMI 18.98 kg/m   BP Readings from Last 3 Encounters:  05/13/16 (!) 160/90  05/13/16 (!) 160/90  12/18/15 136/90    Wt Readings from  Last 3 Encounters:  05/13/16 123 lb (55.8 kg)  05/13/16 123 lb (55.8 kg)  12/18/15 121 lb 6.4 oz (55.1 kg)    General appearance: alert, cooperative and appears stated age Ears: normal TM's and external ear canals both ears Throat: lips, mucosa, and tongue normal; teeth and gums normal Neck: no adenopathy, no carotid bruit, supple, symmetrical, trachea midline and thyroid not enlarged, symmetric, no tenderness/mass/nodules Back: symmetric, no curvature. ROM normal. No CVA tenderness. Lungs: clear to auscultation bilaterally Heart: regular rate and rhythm, S1, S2 normal, no murmur, click, rub or gallop Abdomen: soft, non-tender; bowel sounds normal; no masses,  no organomegaly Pulses: 2+ and symmetric Skin: Skin color, texture, turgor normal. No rashes or lesions Lymph nodes: Cervical, supraclavicular, and axillary nodes normal.  Lab Results  Component Value Date   HGBA1C 4.6 12/27/2013   HGBA1C 5.7 (H) 08/31/2010    Lab Results  Component Value Date   CREATININE 0.55 10/02/2015   CREATININE 0.75 01/06/2015   CREATININE 0.65 07/31/2014    Lab Results  Component Value Date   WBC 7.9 10/02/2015   HGB 13.5 10/02/2015   HCT 40.4 10/02/2015   PLT 209 10/02/2015   GLUCOSE 97 10/02/2015   CHOL 175 08/31/2010   TRIG 67 08/31/2010   HDL 52 08/31/2010   LDLCALC 110 (H) 08/31/2010   ALT 14 11/22/2013   AST 26 11/22/2013   NA 129 (L) 10/02/2015   K 4.1 10/02/2015   CL 95 (L) 10/02/2015   CREATININE 0.55 10/02/2015   BUN 13 10/02/2015   CO2 27 10/02/2015   TSH 1.700 11/27/2013   INR 1.0 04/28/2014   HGBA1C 4.6 12/27/2013    No results found.  Assessment & Plan:   Problem List Items Addressed This Visit    Bladder cancer [facility])    S/p TURBT Oct 2015. Recurrence noted by recent cystoscopy.       Chronic bilateral low back pain    Managed with vicodin.  Advised to consider trial of cymbalta.       Gastric ulcer    History of by EGD 2015,  Avoiding NSAIDs       Hyponatremia    Prior workup unclear ,  Has copd.       LBBB (left bundle branch block)    S/p pacer  In 2016.  Resynchronization resulted in improved ef by echo 10/17      Spinal stenosis of thoracolumbar region    Confirmed by MRI done in 2015.  Symptoms of pain and loss of balance are persistent and she would like to consider surgical options .  Advised she will need a  New MRI prior to neurosurgical evaluation.  encouraged to begin PT to improve leg strength as she has been inactive for 2 years.  A total of 40 minutes was spent with patient more than half of which was spent in counseling patient on the above mentioned issues , reviewing and explaining recent labs and imaging studies done, and coordination of care. I am having Ms. Emminger start on DULoxetine. I am also having her maintain her pantoprazole, polyethylene glycol, HYDROcodone-acetaminophen, multivitamin with minerals, lisinopril, metoprolol succinate, spironolactone, and gabapentin.  Meds ordered this encounter  Medications  . gabapentin (NEURONTIN) 300 MG capsule    Sig: Take 1 capsule by mouth 2 (two) times daily.  . DULoxetine (CYMBALTA) 30 MG capsule    Sig: Take 1 capsule (30 mg total) by mouth daily.    Dispense:  30 capsule    Refill:  2    There are no discontinued medications.  Follow-up: Return in about 6 months (around 11/10/2016).   [name], MD

## 2016-05-15 DIAGNOSIS — M545 Low back pain: Secondary | ICD-10-CM

## 2016-05-15 DIAGNOSIS — G8929 Other chronic pain: Secondary | ICD-10-CM | POA: Insufficient documentation

## 2016-05-15 NOTE — Assessment & Plan Note (Signed)
Confirmed by MRI done in 2015.  Symptoms of pain and loss of balance are persistent and she would like to consider surgical options .  Advised she will need a  New MRI prior to neurosurgical evaluation.  encouraged to begin PT to improve leg strength as she has been inactive for 2 years.

## 2016-05-15 NOTE — Assessment & Plan Note (Signed)
Prior workup unclear ,  Has copd.

## 2016-05-15 NOTE — Assessment & Plan Note (Signed)
History of by EGD 2015,  Avoiding NSAIDs

## 2016-05-15 NOTE — Assessment & Plan Note (Signed)
S/p TURBT Oct 2015. Recurrence noted by recent cystoscopy.  

## 2016-05-15 NOTE — Assessment & Plan Note (Signed)
Managed with vicodin.  Advised to consider trial of cymbalta.

## 2016-05-15 NOTE — Progress Notes (Signed)
  I have reviewed the above information and agree with above.   Hashim Eichhorst, MD 

## 2016-05-15 NOTE — Assessment & Plan Note (Addendum)
S/p pacer  In 2016.  Resynchronization resulted in improved ef by echo 10/17

## 2016-05-18 ENCOUNTER — Telehealth: Payer: Self-pay | Admitting: Internal Medicine

## 2016-05-18 DIAGNOSIS — M48061 Spinal stenosis, lumbar region without neurogenic claudication: Secondary | ICD-10-CM

## 2016-05-18 DIAGNOSIS — M5416 Radiculopathy, lumbar region: Principal | ICD-10-CM

## 2016-05-18 NOTE — Telephone Encounter (Signed)
I called pt- she would like to proceed with MRI of back. The patient has decided to get the second opinion for her back/spine.   She does want to make you aware that she has a pacemaker. Please advise

## 2016-05-18 NOTE — Telephone Encounter (Signed)
Pt called about needing to ask some questions. Pt would like to have the appts made that was spoken about on her visit from Friday. Pt would like a referral that Dr Derrel Nip reccommended. Referral for back and spine. Please advise?  Call pt @ [phone]. Thank you!

## 2016-05-18 NOTE — Telephone Encounter (Signed)
Pt prefers [facility] for second opinion and to have her MRI in [place].

## 2016-05-18 NOTE — Telephone Encounter (Signed)
SHE WILL NOT BE ABLE TO HAVE AN MRI BC OF THE PACER,  CT WILL BE ORDERED.

## 2016-05-19 NOTE — Telephone Encounter (Signed)
Spoke with pt and informed her that Dr. Derrel Nip said that she could not have a MRI because of her pace maker. Explained to the pt that instead Dr. Derrel Nip has ordered her a CT scan and that Melissa from our office would give her a call with her appt date and time. The pt gave a verbal understanding.

## 2016-05-23 ENCOUNTER — Telehealth: Payer: Self-pay | Admitting: Internal Medicine

## 2016-05-23 DIAGNOSIS — R110 Nausea: Secondary | ICD-10-CM

## 2016-05-23 NOTE — Telephone Encounter (Signed)
Is she constipated? When was her last BM?  Did all symptoms start 2 months ago?  I just saw her 2 weeks ago and started her on duloxetine

## 2016-05-23 NOTE — Telephone Encounter (Signed)
Patient calls complaining of Low Back pain chronic worse  loss appetite X 1 month, having  balance issues, nauseated,  Taking in fluids, eating solids, feels weak, seeing urology tomorrow for treatment bladder cancer  treatment    .  They wanted patient to contact PCP in regards to symptoms .  Please advise.

## 2016-05-23 NOTE — Telephone Encounter (Signed)
Pt called and stated that she is not feeling well. She is c/o worse back pain (has CT scan set up for 3/9) loss of appetite, nausea, and weakness. Please advise, thank you!  Call pt @ [phone]

## 2016-05-24 ENCOUNTER — Ambulatory Visit (INDEPENDENT_AMBULATORY_CARE_PROVIDER_SITE_OTHER): Payer: Medicare Other

## 2016-05-24 ENCOUNTER — Encounter: Payer: Self-pay | Admitting: Internal Medicine

## 2016-05-24 ENCOUNTER — Telehealth: Payer: Self-pay | Admitting: Internal Medicine

## 2016-05-24 ENCOUNTER — Ambulatory Visit (INDEPENDENT_AMBULATORY_CARE_PROVIDER_SITE_OTHER): Payer: Medicare Other | Admitting: Internal Medicine

## 2016-05-24 ENCOUNTER — Other Ambulatory Visit: Payer: Self-pay | Admitting: Internal Medicine

## 2016-05-24 DIAGNOSIS — Z8719 Personal history of other diseases of the digestive system: Secondary | ICD-10-CM | POA: Diagnosis not present

## 2016-05-24 DIAGNOSIS — K2900 Acute gastritis without bleeding: Secondary | ICD-10-CM

## 2016-05-24 DIAGNOSIS — K5900 Constipation, unspecified: Secondary | ICD-10-CM | POA: Diagnosis not present

## 2016-05-24 DIAGNOSIS — R110 Nausea: Secondary | ICD-10-CM

## 2016-05-24 DIAGNOSIS — C679 Malignant neoplasm of bladder, unspecified: Secondary | ICD-10-CM

## 2016-05-24 DIAGNOSIS — Z8551 Personal history of malignant neoplasm of bladder: Secondary | ICD-10-CM | POA: Diagnosis not present

## 2016-05-24 DIAGNOSIS — E871 Hypo-osmolality and hyponatremia: Secondary | ICD-10-CM | POA: Diagnosis not present

## 2016-05-24 DIAGNOSIS — Z8711 Personal history of peptic ulcer disease: Secondary | ICD-10-CM

## 2016-05-24 DIAGNOSIS — R300 Dysuria: Secondary | ICD-10-CM | POA: Diagnosis not present

## 2016-05-24 LAB — POCT URINALYSIS DIPSTICK
BILIRUBIN UA: NEGATIVE
GLUCOSE UA: NEGATIVE
Ketones, UA: NEGATIVE
Nitrite, UA: NEGATIVE
Protein, UA: NEGATIVE
UROBILINOGEN UA: 0.2
pH, UA: 5.5

## 2016-05-24 LAB — COMPREHENSIVE METABOLIC PANEL
ALK PHOS: 95 U/L (ref 39–117)
ALT: 15 U/L (ref 0–35)
AST: 23 U/L (ref 0–37)
Albumin: 4.7 g/dL (ref 3.5–5.2)
BILIRUBIN TOTAL: 0.6 mg/dL (ref 0.2–1.2)
BUN: 10 mg/dL (ref 6–23)
CALCIUM: 10 mg/dL (ref 8.4–10.5)
CO2: 29 meq/L (ref 19–32)
CREATININE: 0.54 mg/dL (ref 0.40–1.20)
Chloride: 90 mEq/L — ABNORMAL LOW (ref 96–112)
GFR: 116.3 mL/min (ref >60.00)
GLUCOSE: 117 mg/dL — AB (ref 70–99)
Potassium: 4.4 mEq/L (ref 3.5–5.1)
Sodium: 121 mEq/L — CL (ref 135–145)
TOTAL PROTEIN: 7.3 g/dL (ref 6.0–8.3)

## 2016-05-24 MED ORDER — PANTOPRAZOLE SODIUM 40 MG PO TBEC: 40.0000 mg | DELAYED_RELEASE_TABLET | Freq: Two times a day (BID) | ORAL | 2 refills | Status: DC

## 2016-05-24 MED ORDER — PROMETHAZINE HCL 12.5 MG PO TABS: 12.5000 mg | ORAL_TABLET | Freq: Three times a day (TID) | ORAL | 0 refills | Status: DC | PRN

## 2016-05-24 MED ORDER — SUCRALFATE 1 G PO TABS: 1.0000 g | ORAL_TABLET | Freq: Three times a day (TID) | ORAL | 2 refills | Status: DC

## 2016-05-24 NOTE — Progress Notes (Signed)
Pre visit review using our clinic review tool, if applicable. No additional management support is needed unless otherwise documented below in the visit note. 

## 2016-05-24 NOTE — Progress Notes (Signed)
Subjective:  Patient ID: [name], female    DOB: [birth year]  Age: 76 y.o. MRN: [id number]  CC: Diagnoses of Nausea, Hyponatremia, History of gastric ulcer, Malignant neoplasm of urinary bladder, unspecified site [facility]), and Acute gastritis, presence of bleeding unspecified, unspecified gastritis type were pertinent to this visit.  HPI MERDIS SLATE presents for multiple symptoms   Nauseated, occurring daily.  Aggravated by eaitng and drinking . No vomiting.  But feels close to vomiting on multiple occasions. Does not take nsaids,  Takes protonix daily for history of gastric ulcer diagnosed in sept 2015.  resolved by repeat endoscopy.  Belching often.    Denies constipation   but not moving bowels on a daily basis. Can't remember when last one was;  has postponed treatment for bladder cancer that was going to be done  today..  Some burning with urination occurs from time to time.   the legs have become " SORE " , feeling like they have been exercised too hard.      Outpatient Medications Prior to Visit  Medication Sig Dispense Refill  . DULoxetine (CYMBALTA) 30 MG capsule Take 1 capsule (30 mg total) by mouth daily. 30 capsule 2  . gabapentin (NEURONTIN) 300 MG capsule Take 1 capsule by mouth 2 (two) times daily.    Marland Kitchen HYDROcodone-acetaminophen (NORCO) 10-325 MG per tablet Take 1 tablet by mouth every 6 (six) hours as needed (Pain).    Marland Kitchen lisinopril (PRINIVIL,ZESTRIL) 5 MG tablet Take 1 tablet (5 mg total) by mouth daily. 30 tablet 8  . metoprolol succinate (TOPROL-XL) 25 MG 24 hr tablet Take 1 tablet (25 mg total) by mouth daily. 30 tablet 8  . Multiple Vitamin (MULTIVITAMIN WITH MINERALS) TABS tablet Take 1 tablet by mouth daily.    . polyethylene glycol (MIRALAX / GLYCOLAX) packet Take 17 g by mouth daily as needed for mild constipation.     Marland Kitchen spironolactone (ALDACTONE) 25 MG tablet Take one tablet by mouth daily 30 tablet 8  . pantoprazole (PROTONIX) 40 MG tablet Take 40 mg by mouth  daily.      No facility-administered medications prior to visit.     Review of Systems;  Patient denies headache, fevers, malaise, unintentional weight loss, skin rash, eye pain, sinus congestion and sinus pain, sore throat, dysphagia,  hemoptysis , cough, dyspnea, wheezing, chest pain, palpitations, orthopnea, edema, abdominal pain, nausea, melena, diarrhea, constipation, flank pain, dysuria, hematuria, urinary  Frequency, nocturia, numbness, tingling, seizures,  Focal weakness, Loss of consciousness,  Tremor, insomnia, depression, anxiety, and suicidal ideation.      Objective:  BP (!) 156/90 (BP Location: Right Arm, Patient Position: Sitting, Cuff Size: Normal)   Pulse 68   Temp 98.5 F (36.9 C) (Oral)   Wt 124 lb 3.2 oz (56.3 kg)   SpO2 95%   BMI 19.17 kg/m   BP Readings from Last 3 Encounters:  05/24/16 (!) 156/90  05/13/16 (!) 160/90  05/13/16 (!) 160/90    Wt Readings from Last 3 Encounters:  05/24/16 124 lb 3.2 oz (56.3 kg)  05/13/16 123 lb (55.8 kg)  05/13/16 123 lb (55.8 kg)    General appearance: alert, cooperative and appears stated age Ears: normal TM's and external ear canals both ears Throat: lips, mucosa, and tongue normal; teeth and gums normal Neck: no adenopathy, no carotid bruit, supple, symmetrical, trachea midline and thyroid not enlarged, symmetric, no tenderness/mass/nodules Back: symmetric, no curvature. ROM normal. No CVA tenderness. Lungs: clear to auscultation bilaterally Heart:  regular rate and rhythm, S1, S2 normal, no murmur, click, rub or gallop Abdomen: soft, non-tender; bowel sounds normal; no masses,  no organomegaly Pulses: 2+ and symmetric Skin: Skin color, texture, turgor normal. No rashes or lesions Lymph nodes: Cervical, supraclavicular, and axillary nodes normal.  Lab Results  Component Value Date   HGBA1C 4.6 12/27/2013   HGBA1C 5.7 (H) 08/31/2010    Lab Results  Component Value Date   CREATININE 0.51 05/26/2016    CREATININE 0.54 05/24/2016   CREATININE 0.55 10/02/2015    Lab Results  Component Value Date   WBC 7.9 10/02/2015   HGB 13.5 10/02/2015   HCT 40.4 10/02/2015   PLT 209 10/02/2015   GLUCOSE 94 05/26/2016   CHOL 175 08/31/2010   TRIG 67 08/31/2010   HDL 52 08/31/2010   LDLCALC 110 (H) 08/31/2010   ALT 15 05/24/2016   AST 23 05/24/2016   NA 123 (L) 05/26/2016   K 4.6 05/26/2016   CL 92 (L) 05/26/2016   CREATININE 0.51 05/26/2016   BUN 7 05/26/2016   CO2 29 05/26/2016   TSH 1.700 11/27/2013   INR 1.0 04/28/2014   HGBA1C 4.6 12/27/2013    No results found.  Assessment & Plan:   Problem List Items Addressed This Visit    Bladder cancer [facility])    S/p TURBT Oct 2015. Recurrence noted by recent cystoscopy.       Gastritis    Increasing protonix to b I,  Adding sucralfate.  Gi referral if no improvement in 2 weeks      History of gastric ulcer    History of ulcer  By EGD 2015,  Resolved by repeat egd, current symptoms suggestive of recurrence despite avoiding NSAIDs, and taking PPI.  Adding sucralfate,        Hyponatremia    Critically low sodium noted with no recent for comparison.  Attributed to dehydration secondary to anorexia and nausea from gastritis caused by stopping her PPI.  Advised to increase hydration using gatorade,. Repeat level is slightly improved. contineu aggressive oral intake and repeat  bmet on Monday        Other Visit Diagnoses    Nausea          I have changed Ms. Rosato's pantoprazole. I am also having her start on sucralfate and promethazine. Additionally, I am having her maintain her polyethylene glycol, HYDROcodone-acetaminophen, multivitamin with minerals, lisinopril, metoprolol succinate, spironolactone, gabapentin, and DULoxetine.  Meds ordered this encounter  Medications  . sucralfate (CARAFATE) 1 g tablet    Sig: Take 1 tablet (1 g total) by mouth 4 (four) times daily -  with meals and at bedtime.    Dispense:  120 tablet    Refill:   2  . promethazine (PHENERGAN) 12.5 MG tablet    Sig: Take 1 tablet (12.5 mg total) by mouth every 8 (eight) hours as needed for nausea or vomiting.    Dispense:  20 tablet    Refill:  0  . pantoprazole (PROTONIX) 40 MG tablet    Sig: Take 1 tablet (40 mg total) by mouth 2 (two) times daily.    Dispense:  60 tablet    Refill:  2    Medications Discontinued During This Encounter  Medication Reason  . pantoprazole (PROTONIX) 40 MG tablet Reorder    Follow-up: No Follow-up on file.   [name], MD

## 2016-05-24 NOTE — Telephone Encounter (Signed)
Patient has been added to Dr. Lupita Dawn schedule at 4:30.

## 2016-05-24 NOTE — Telephone Encounter (Signed)
Last bowel movement yesterday 05/23/16.  Nauseated, weakness, lower back pain left hip pain when she stands up to walk balance is off these symptoms have increased over  the last week.

## 2016-05-24 NOTE — Telephone Encounter (Signed)
Spoke with [name] daughter DPR  , they will come in today prior to appointment for xray.   Can you please add to schedule for today at 4:30pm  Thanks.

## 2016-05-24 NOTE — Telephone Encounter (Signed)
CAN YOU SEE IF PATIENT CAN COME TOMORROW AT 11:00 AM INSTEAD OF TODAY AT 4;30?

## 2016-05-24 NOTE — Patient Instructions (Addendum)
I belleve you have gastritis or an early ulcer   Increase protonix to two times daily  For the next two weeks   We will add sucralfate if the films do not suggest constipation as the cause,  Or if relieving the constipation does not resolve the nausea   Prescribing generic phenergan for nausea,  Full tablet tonight,  1/2 tablet for daytime use if needed  Every every 8 hours   Gastritis, Adult Gastritis is inflammation of the stomach. There are two kinds of gastritis:  Acute gastritis. This kind develops suddenly.  Chronic gastritis. This kind lasts for a long time. Gastritis happens when the lining of the stomach becomes weak or gets damaged. Without treatment, gastritis can lead to stomach bleeding and ulcers. What are the causes? This condition may be caused by:  An infection.  Drinking too much alcohol.  Certain medicines.  Having too much acid in the stomach.  A disease of the intestines or stomach.  Stress. What are the signs or symptoms? Symptoms of this condition include:  Pain or a burning in the upper abdomen.  Nausea.  Vomiting.  An uncomfortable feeling of fullness after eating. In some cases, there are no symptoms. How is this diagnosed? This condition may be diagnosed with:  A description of your symptoms.  A physical exam.  Tests. These can include:  Blood tests.  Stool tests.  A test in which a thin, flexible instrument with a light and camera on the end is passed down the esophagus and into the stomach (upper endoscopy).  A test in which a sample of tissue is taken for testing (biopsy). How is this treated? This condition may be treated with medicines. If the condition is caused by a bacterial infection, you may be given antibiotic medicines. If it is caused by too much acid in the stomach, you may get medicines called H2 blockers, proton pump inhibitors, or antacids. Treatment may also involve stopping the use of certain medicines, such as  aspirin, ibuprofen, or other nonsteroidal anti-inflammatory drugs (NSAIDs). Follow these instructions at home:  Take over-the-counter and prescription medicines only as told by your health care provider.  If you were prescribed an antibiotic, take it as told by your health care provider. Do not stop taking the antibiotic even if you start to feel better.  Drink enough fluid to keep your urine clear or pale yellow.  Eat small, frequent meals instead of large meals. Contact a health care provider if:  Your symptoms get worse.  Your symptoms return after treatment. Get help right away if:  You vomit blood or material that looks like coffee grounds.  You have black or dark red stools.  You are unable to keep fluids down.  Your abdominal pain gets worse.  You have a fever.  You do not feel better after 1 week. This information is not intended to replace advice given to you by your health care provider. Make sure you discuss any questions you have with your health care provider. Document Released: 03/01/2001 Document Revised: 11/04/2015 Document Reviewed: 11/29/2014 Elsevier Interactive Patient Education  2017 [employer].

## 2016-05-24 NOTE — Telephone Encounter (Signed)
She needs an appointment,  Either me at 4:30 or with  another provider,  I cannot treat by phone .  Have her get abdominal films and labs PRIOR TO APPOINTMENT.  ALL ARE ORDERED

## 2016-05-24 NOTE — Telephone Encounter (Signed)
Pt called back returning your call.   Call pt @ [phone]. Thank You!

## 2016-05-24 NOTE — Telephone Encounter (Signed)
Attempted to contact patient regarding low sodium critical lab but multiple no answer. Review of chart indicates symptoms of nausea which are acute to subacute. Last labs some time ago and would recommend PCP to repeat tomorrow, if still low would recommend treatment. If she is not eating or drinking recommend hydration oral if antiemetics are helping or IV if unable to tolerate PO.

## 2016-05-25 ENCOUNTER — Telehealth: Payer: Self-pay | Admitting: *Deleted

## 2016-05-25 ENCOUNTER — Telehealth: Payer: Self-pay | Admitting: Internal Medicine

## 2016-05-25 DIAGNOSIS — E871 Hypo-osmolality and hyponatremia: Secondary | ICD-10-CM

## 2016-05-25 LAB — URINALYSIS, MICROSCOPIC ONLY: RBC / HPF: NONE SEEN (ref 0–?)

## 2016-05-25 NOTE — Telephone Encounter (Signed)
Spoke with [name] and advised of results

## 2016-05-25 NOTE — Telephone Encounter (Signed)
PER PCP Patient seen on 05/24/16

## 2016-05-25 NOTE — Telephone Encounter (Signed)
Patients daughter advised of results per DPR .   She verbalized understanding.  Appointment scheduled for lab on Thursday.

## 2016-05-25 NOTE — Telephone Encounter (Signed)
bmet orfdered

## 2016-05-25 NOTE — Telephone Encounter (Signed)
Pt daughter called and stated that pt is going to be staying with her the next few days and to call [name] with results.   [name] @ [phone]

## 2016-05-25 NOTE — Telephone Encounter (Signed)
Pt has lab appt for tomorrow. No orders found. It looks as though pt came in yesterday & had CMP drawn, collected a urine, & had an abdominal x-ray. Please let me know if she needs this lab appt.

## 2016-05-25 NOTE — Telephone Encounter (Signed)
Please try to  contact patient's daughter  See my prior results noted. Needs to  Increase intake of gatorade,  Other electrolyte replacement drinks  and repeat bmet on thrudsday  i

## 2016-05-26 ENCOUNTER — Other Ambulatory Visit (INDEPENDENT_AMBULATORY_CARE_PROVIDER_SITE_OTHER): Payer: Medicare Other

## 2016-05-26 ENCOUNTER — Other Ambulatory Visit: Payer: Self-pay | Admitting: Internal Medicine

## 2016-05-26 DIAGNOSIS — E871 Hypo-osmolality and hyponatremia: Secondary | ICD-10-CM

## 2016-05-26 DIAGNOSIS — T39395A Adverse effect of other nonsteroidal anti-inflammatory drugs [NSAID], initial encounter: Secondary | ICD-10-CM

## 2016-05-26 DIAGNOSIS — K2960 Other gastritis without bleeding: Secondary | ICD-10-CM | POA: Insufficient documentation

## 2016-05-26 LAB — BASIC METABOLIC PANEL
BUN: 7 mg/dL (ref 6–23)
CO2: 29 mEq/L (ref 19–32)
CREATININE: 0.51 mg/dL (ref 0.40–1.20)
Calcium: 9.6 mg/dL (ref 8.4–10.5)
Chloride: 92 mEq/L — ABNORMAL LOW (ref 96–112)
GFR: 124.23 mL/min (ref >60.00)
Glucose, Bld: 94 mg/dL (ref 70–99)
Potassium: 4.6 mEq/L (ref 3.5–5.1)
Sodium: 123 mEq/L — ABNORMAL LOW (ref 135–145)

## 2016-05-26 LAB — URINE CULTURE: Organism ID, Bacteria: NO GROWTH

## 2016-05-26 NOTE — Assessment & Plan Note (Signed)
S/p TURBT Oct 2015. Recurrence noted by recent cystoscopy.

## 2016-05-26 NOTE — Assessment & Plan Note (Addendum)
Increasing protonix to b I,  Adding sucralfate.  Gi referral if no improvement in 2 weeks

## 2016-05-26 NOTE — Assessment & Plan Note (Signed)
Critically low sodium noted with no recent for comparison.  Attributed to dehydration secondary to anorexia and nausea from gastritis caused by stopping her PPI.  Advised to increase hydration using gatorade,. Repeat level is slightly improved. contineu aggressive oral intake and repeat  bmet on Monday

## 2016-05-26 NOTE — Assessment & Plan Note (Addendum)
History of ulcer  By EGD 2015,  Resolved by repeat egd, current symptoms suggestive of recurrence despite avoiding NSAIDs, and taking PPI.  Adding sucralfate,

## 2016-05-27 ENCOUNTER — Ambulatory Visit (HOSPITAL_COMMUNITY)
Admission: RE | Admit: 2016-05-27 | Discharge: 2016-05-27 | Disposition: A | Discharge status: Home / Self Care | Payer: Medicare Other | Source: Ambulatory Visit | Attending: Internal Medicine | Admitting: Internal Medicine

## 2016-05-27 DIAGNOSIS — M2578 Osteophyte, vertebrae: Secondary | ICD-10-CM | POA: Insufficient documentation

## 2016-05-27 DIAGNOSIS — M47816 Spondylosis without myelopathy or radiculopathy, lumbar region: Secondary | ICD-10-CM | POA: Diagnosis not present

## 2016-05-27 DIAGNOSIS — M5416 Radiculopathy, lumbar region: Secondary | ICD-10-CM

## 2016-05-27 DIAGNOSIS — I700 Atherosclerosis of aorta: Secondary | ICD-10-CM | POA: Diagnosis not present

## 2016-05-27 DIAGNOSIS — M48061 Spinal stenosis, lumbar region without neurogenic claudication: Secondary | ICD-10-CM | POA: Diagnosis present

## 2016-05-27 DIAGNOSIS — M1288 Other specific arthropathies, not elsewhere classified, other specified site: Secondary | ICD-10-CM | POA: Insufficient documentation

## 2016-05-27 DIAGNOSIS — M4726 Other spondylosis with radiculopathy, lumbar region: Secondary | ICD-10-CM | POA: Insufficient documentation

## 2016-05-30 ENCOUNTER — Other Ambulatory Visit: Payer: Medicare Other

## 2016-05-31 DIAGNOSIS — C671 Malignant neoplasm of dome of bladder: Secondary | ICD-10-CM | POA: Diagnosis not present

## 2016-05-31 DIAGNOSIS — Z5111 Encounter for antineoplastic chemotherapy: Secondary | ICD-10-CM | POA: Diagnosis not present

## 2016-06-01 ENCOUNTER — Other Ambulatory Visit (INDEPENDENT_AMBULATORY_CARE_PROVIDER_SITE_OTHER): Payer: Medicare Other

## 2016-06-01 DIAGNOSIS — E871 Hypo-osmolality and hyponatremia: Secondary | ICD-10-CM

## 2016-06-01 LAB — BASIC METABOLIC PANEL
BUN: 10 mg/dL (ref 6–23)
CHLORIDE: 97 meq/L (ref 96–112)
CO2: 30 meq/L (ref 19–32)
CREATININE: 0.6 mg/dL (ref 0.40–1.20)
Calcium: 10 mg/dL (ref 8.4–10.5)
GFR: 102.98 mL/min (ref >60.00)
GLUCOSE: 84 mg/dL (ref 70–99)
Potassium: 4.7 mEq/L (ref 3.5–5.1)
Sodium: 132 mEq/L — ABNORMAL LOW (ref 135–145)

## 2016-06-03 ENCOUNTER — Other Ambulatory Visit: Payer: Self-pay | Admitting: Internal Medicine

## 2016-06-03 ENCOUNTER — Encounter (HOSPITAL_BASED_OUTPATIENT_CLINIC_OR_DEPARTMENT_OTHER): Payer: Self-pay | Admitting: Emergency Medicine

## 2016-06-03 ENCOUNTER — Telehealth: Payer: Self-pay | Admitting: Internal Medicine

## 2016-06-03 ENCOUNTER — Emergency Department (HOSPITAL_BASED_OUTPATIENT_CLINIC_OR_DEPARTMENT_OTHER)
Admission: EM | Admit: 2016-06-03 | Discharge: 2016-06-03 | Disposition: A | Discharge status: Home / Self Care | Payer: Medicare Other | Attending: Emergency Medicine | Admitting: Emergency Medicine

## 2016-06-03 DIAGNOSIS — Z8551 Personal history of malignant neoplasm of bladder: Secondary | ICD-10-CM | POA: Diagnosis not present

## 2016-06-03 DIAGNOSIS — R51 Headache: Secondary | ICD-10-CM | POA: Diagnosis present

## 2016-06-03 DIAGNOSIS — J449 Chronic obstructive pulmonary disease, unspecified: Secondary | ICD-10-CM | POA: Diagnosis not present

## 2016-06-03 DIAGNOSIS — F17210 Nicotine dependence, cigarettes, uncomplicated: Secondary | ICD-10-CM | POA: Diagnosis not present

## 2016-06-03 DIAGNOSIS — Z79899 Other long term (current) drug therapy: Secondary | ICD-10-CM | POA: Insufficient documentation

## 2016-06-03 DIAGNOSIS — I10 Essential (primary) hypertension: Secondary | ICD-10-CM | POA: Diagnosis not present

## 2016-06-03 DIAGNOSIS — I509 Heart failure, unspecified: Secondary | ICD-10-CM | POA: Diagnosis not present

## 2016-06-03 DIAGNOSIS — Z95810 Presence of automatic (implantable) cardiac defibrillator: Secondary | ICD-10-CM | POA: Insufficient documentation

## 2016-06-03 DIAGNOSIS — I110 Hypertensive heart disease with heart failure: Secondary | ICD-10-CM | POA: Insufficient documentation

## 2016-06-03 LAB — URINALYSIS, ROUTINE W REFLEX MICROSCOPIC
BILIRUBIN URINE: NEGATIVE
Glucose, UA: NEGATIVE mg/dL
KETONES UR: NEGATIVE mg/dL
Leukocytes, UA: NEGATIVE
Nitrite: NEGATIVE
PROTEIN: NEGATIVE mg/dL
Specific Gravity, Urine: 1.003 — ABNORMAL LOW (ref 1.005–1.030)
pH: 7.5 (ref 5.0–8.0)

## 2016-06-03 LAB — URINALYSIS, MICROSCOPIC (REFLEX): WBC, UA: NONE SEEN WBC/hpf (ref 0–5)

## 2016-06-03 MED ORDER — ACETAMINOPHEN 500 MG PO TABS
1000.0000 mg | ORAL_TABLET | Freq: Once | ORAL | Status: AC
  Administered 2016-06-03: 1000 mg via ORAL
  Filled 2016-06-03: qty 2

## 2016-06-03 MED ORDER — LISINOPRIL 20 MG PO TABS: 20.0000 mg | ORAL_TABLET | Freq: Every day | ORAL | 0 refills | Status: DC

## 2016-06-03 NOTE — Telephone Encounter (Signed)
[facility] [place] Patient Name: [name] DOB: Dec 26, 1940 Initial Comment Staci Righter [birth year] blood pressure 195/117 pulse of 78 (really high) Nurse Assessment Nurse: Martyn Ehrich RN, Felicia Date/Time (Eastern Time): 06/03/2016 4:50:08 PM Confirm and document reason for call. If symptomatic, describe symptoms. ---PT has BP 195/117 in last took it 30 min. Pulse is 78. She checked it bc last time she saw MD she said checked her BP everyday but she was not doing it. She noticed a low grade HA for 2 weeks on and off. Now pain level 2 Does the patient have any new or worsening symptoms? ---Yes Will a triage be completed? ---Yes Related visit to physician within the last 2 weeks? ---No Does the PT have any chronic conditions? (i.e. diabetes, asthma, etc.) ---YesList chronic conditions. ---HTN, (has had heart rate issues the past) MD stopped spirinolactone on 6th Is this a behavioral health or substance abuse call? ---No Guidelines Guideline Title Affirmed Question Affirmed Notes High Blood Pressure [1] BP # 200/120 AND [2] having NO cardiac or neurologic symptoms Final Disposition User See Physician within 4 Hours (or PCP triage) Martyn Ehrich, RN, Felicia Comments Now BP is 200/129 P73 Referrals GO TO FACILITY OTHER - SPECIFY Disagree/Comply: Comply Call Id: [phone]

## 2016-06-03 NOTE — Telephone Encounter (Signed)
Increase lisinopril to 20 mg twice daily starting now  .  If headache becomes worse or she becomes dizzy or short of breath,  Go to ER

## 2016-06-03 NOTE — ED Notes (Signed)
States saw primary MD on the 23rd of Feb, BP elevated then, has been having HA, spoke with primary MD office today, told office the results of BP at home. Was told to take additional dose of lisinopril. BP remained elevated, pt was told to come to ED if BP remained elevated

## 2016-06-03 NOTE — ED Provider Notes (Signed)
[facility] Provider Note   CSN: [id number] Arrival date & time: 06/03/16  2005  By signing my name below, I, Collene Leyden, attest that this documentation has been prepared under the direction and in the presence of Leo Grosser, MD. Electronically Signed: Collene Leyden, Scribe. 06/03/16. 9:05 PM.  History   Chief Complaint Chief Complaint  Patient presents with  . Hypertension    HPI Comments: [name] is a 76 y.o. female with a history of HTN, CHF, and COPD, who presents to the Emergency Department complaining of sudden-onset high blood pressure that began 3 weeks ago. Patient was told to check her blood pressure by her primary physician, in which she did not. Patient states she went to [facility] to have her blood pressure checked, in which she was told her blood pressure is elevated. Patient reports an associated mild headache. Patient reports taking 20 mg of lisinopril and 25 mg of metoprolol ER with no improvement. Lisinopril has recently been increased to 20 mg today. Patient denies any weakness, numbness, tingling, chest pain, or shortness of breath.   The history is provided by the patient. No language interpreter was used.    Past Medical History:  Diagnosis Date  . AICD (automatic cardioverter/defibrillator) present   . Anemia   . Arthritis    "in about all my joints; for sure in my back"  . Bladder cancer ([place]) 1995; 2015  . CHF (congestive heart failure) ([place])   . Chronic lower back pain   . COPD (chronic obstructive pulmonary disease) ([place])   . Ectopic atrial beats   . Frequent urination   . GERD (gastroesophageal reflux disease)    h/o ulcers  . History of stomach ulcers 11/2013  . Hypertension   . LBBB (left bundle branch block)   . Migraines    "stopped before menopause"    Patient Active Problem List   Diagnosis Date Noted  . Gastritis 05/26/2016  . Chronic bilateral low back pain 05/15/2016  . Spinal stenosis in cervical region  01/19/2014  . Constipation 12/29/2013  . Spinal stenosis of thoracolumbar region 12/27/2013  . COPD (chronic obstructive pulmonary disease) ([place]) 12/27/2013  . History of gastric ulcer 11/28/2013  . Congestive dilated cardiomyopathy ([place]) 11/25/2013  . Bladder cancer ([place]) 11/24/2013  . LBBB (left bundle branch block) 11/24/2013  . Atrial ectopy 11/24/2013  . Palpitations 11/22/2013  . Hyponatremia 11/22/2013    Past Surgical History:  Procedure Laterality Date  . BACK SURGERY    . BI-VENTRICULAR IMPLANTABLE CARDIOVERTER DEFIBRILLATOR N/A 05/07/2014   Procedure: BI-VENTRICULAR IMPLANTABLE CARDIOVERTER DEFIBRILLATOR  (CRT-D);  Surgeon: Deboraha Sprang, MD;  Location: [facility] CATH LAB;  Service: Cardiovascular;  Laterality: N/A;  . CARDIAC CATHETERIZATION  ~ 02/2014  . CARDIAC DEFIBRILLATOR PLACEMENT  05/07/2014   dual chamber  . CATARACT EXTRACTION W/ INTRAOCULAR LENS  IMPLANT, BILATERAL Bilateral 12-2013 - 02-2014  . CYSTOSCOPY W/ RETROGRADES Bilateral 01/06/2015   Procedure: CYSTOSCOPY WITH  BLADDER BIOPSY BILATERAL RETROGRADE PYELOGRAM,INSTILLATION OF MITOMYCIN C;  Surgeon: [name], MD;  Location: [facility];  Service: Urology;  Laterality: Bilateral;  . CYSTOSCOPY WITH BIOPSY N/A 10/06/2015   Procedure: CYSTO WITH BLADDER BIOPSY, FULGERATION, CHEMO IRRIGATION EPIRUBICIN IN PACU;  Surgeon: [name], MD;  Location: [facility];  Service: Urology;  Laterality: N/A;  . CYSTOSCOPY WITH RETROGRADE PYELOGRAM, URETEROSCOPY AND STENT PLACEMENT Bilateral 04/18/2014   Procedure: CYSTOSCOPY WITH RETROGRADE PYELOGRAM;  Surgeon: [name], MD;  Location: [facility];  Service: Urology;  Laterality: Bilateral;  .  ESOPHAGOGASTRODUODENOSCOPY N/A 11/28/2013   Procedure: ESOPHAGOGASTRODUODENOSCOPY (EGD);  Surgeon: Arta Silence, MD;  Location: [facility] ENDOSCOPY;  Service: Endoscopy;  Laterality: N/A;  . LUMBAR [place]  1980's   "ruptured disc"  . TRANSURETHRAL RESECTION OF BLADDER TUMOR N/A 12/13/2013    Procedure: TRANSURETHRAL RESECTION OF BLADDER TUMOR (TURBT);  Surgeon: [name], MD;  Location: [facility];  Service: Urology;  Laterality: N/A;  . TRANSURETHRAL RESECTION OF BLADDER TUMOR N/A 01/17/2014   Procedure: TRANSURETHRAL RESECTION OF BLADDER TUMOR (TURBT);  Surgeon: [name], MD;  Location: [facility];  Service: Urology;  Laterality: N/A;  . TRANSURETHRAL RESECTION OF BLADDER TUMOR N/A 04/18/2014   Procedure: TRANSURETHRAL RESECTION OF BLADDER TUMOR (TURBT), CYSTOGRAM;  Surgeon: [name], MD;  Location: [facility];  Service: Urology;  Laterality: N/A;    OB History    No data available       Home Medications    Prior to Admission medications   Medication Sig Start Date End Date Taking? Authorizing Provider  DULoxetine (CYMBALTA) 30 MG capsule Take 1 capsule (30 mg total) by mouth daily. 05/13/16   [name], MD  gabapentin (NEURONTIN) 300 MG capsule Take 1 capsule by mouth 2 (two) times daily. 03/28/16   Historical Provider, MD  HYDROcodone-acetaminophen (NORCO) 10-325 MG per tablet Take 1 tablet by mouth every 6 (six) hours as needed (Pain).    Historical Provider, MD  lisinopril (PRINIVIL,ZESTRIL) 20 MG tablet Take 1 tablet (20 mg total) by mouth daily. 06/03/16   [name], MD  metoprolol succinate (TOPROL-XL) 25 MG 24 hr tablet Take 1 tablet (25 mg total) by mouth daily. 10/20/15   Wellington Hampshire, MD  Multiple Vitamin (MULTIVITAMIN WITH MINERALS) TABS tablet Take 1 tablet by mouth daily.    Historical Provider, MD  pantoprazole (PROTONIX) 40 MG tablet Take 1 tablet (40 mg total) by mouth 2 (two) times daily. 05/24/16   [name], MD  polyethylene glycol (MIRALAX / GLYCOLAX) packet Take 17 g by mouth daily as needed for mild constipation.     Historical Provider, MD  promethazine (PHENERGAN) 12.5 MG tablet Take 1 tablet (12.5 mg total) by mouth every 8 (eight) hours as needed for nausea or vomiting. 05/24/16   [name], MD  spironolactone (ALDACTONE)  25 MG tablet Take one tablet by mouth daily 10/20/15   Wellington Hampshire, MD  sucralfate (CARAFATE) 1 g tablet Take 1 tablet (1 g total) by mouth 4 (four) times daily -  with meals and at bedtime. 05/24/16   [name], MD    Family History Family History  Problem Relation Age of Onset  . Cancer Mother   . Cancer Brother     Social History Social History  Substance Use Topics  . Smoking status: Current Every Day Smoker    Packs/day: 0.50    Years: 55.00    Types: Cigarettes    Last attempt to quit: 11/22/2013  . Smokeless tobacco: Never Used  . Alcohol use No     Allergies   Patient has no known allergies.   Review of Systems Review of Systems  Constitutional: Negative for fever.  Respiratory: Negative for shortness of breath.   Cardiovascular: Negative for chest pain and palpitations.  Gastrointestinal: Negative for nausea and vomiting.  Neurological: Positive for headaches. Negative for syncope, weakness and numbness.  All other systems reviewed and are negative.    Physical Exam Updated Vital Signs BP (!) 180/101 (BP Location: Left Arm)  Pulse 73   Temp 97.6 F (36.4 C) (Oral)   Resp 18   Ht 5' 7.5" (1.715 m)   Wt 124 lb (56.2 kg)   SpO2 100%   BMI 19.13 kg/m   Physical Exam  Constitutional: She is oriented to person, place, and time. She appears well-developed and well-nourished. No distress.  HENT:  Head: Normocephalic.  Nose: Nose normal.  Eyes: Conjunctivae are normal.  Neck: Neck supple. No tracheal deviation present.  Cardiovascular: Normal rate, regular rhythm and normal heart sounds.  Exam reveals no gallop and no friction rub.   No murmur heard. Pulmonary/Chest: Effort normal and breath sounds normal. No respiratory distress. She has no wheezes. She has no rales.  Abdominal: Soft. She exhibits no distension.  Musculoskeletal: Normal range of motion.  Neurological: She is alert and oriented to person, place, and time. No cranial nerve deficit  or sensory deficit. Coordination normal.  Skin: Skin is warm and dry.  Psychiatric: She has a normal mood and affect.     ED Treatments / Results  DIAGNOSTIC STUDIES: Oxygen Saturation is 100% on RA, normal by my interpretation.    COORDINATION OF CARE: 9:03 PM Discussed treatment plan with pt at bedside and pt agreed to plan.  Labs (all labs ordered are listed, but only abnormal results are displayed) Labs Reviewed  URINALYSIS, ROUTINE W REFLEX MICROSCOPIC    EKG  EKG Interpretation None       Radiology No results found.  Procedures Procedures (including critical care time)  Medications Ordered in ED Medications - No data to display   Initial Impression / Assessment and Plan / ED Course  I have reviewed the triage vital signs and the nursing notes.  Pertinent labs & imaging results that were available during my care of the patient were reviewed by me and considered in my medical decision making (see chart for details).     76 y.o. female presents with asymptomatic hypertension. Her family doctor told her to come to ED if it didn't come down with increase in lisinopril. We discussed slow gradual correction of her HTN and tylenol for the occasional headaches. Well appearing and no indication for immediate intervention. Plan to follow up with PCP as needed and return precautions discussed for worsening or new concerning symptoms.   Final Clinical Impressions(s) / ED Diagnoses   Final diagnoses:  Essential hypertension    New Prescriptions Discharge Medication List as of 06/03/2016  9:14 PM     I personally performed the services described in this documentation, which was scribed in my presence. The recorded information has been reviewed and is accurate.       Leo Grosser, MD 06/04/16 [phone]

## 2016-06-03 NOTE — ED Triage Notes (Signed)
Patient was told check he blood pressure 3 weeks ago every day. The patient reports that she was unable to use her BP machine at home and went to [facility] to check it and it was elevated. Patient reports that she called her MD and told her to come here.

## 2016-06-03 NOTE — ED Notes (Signed)
ED Provider at bedside. 

## 2016-06-03 NOTE — Telephone Encounter (Signed)
Pt called and stated that her bp is 195/117 with a pulse rate of 78. Pt states that she has had a slight headache. Sent call to [facility] triage.  Call pt @ [phone]

## 2016-06-03 NOTE — Telephone Encounter (Signed)
Printed off message gave to Dr. Derrel Nip see note from PCP

## 2016-06-03 NOTE — ED Notes (Signed)
Denies SOB, chest pain, dizziness

## 2016-06-03 NOTE — Telephone Encounter (Signed)
Medication sent to [facility] in [place] , patient aware of instructions.

## 2016-06-03 NOTE — ED Notes (Signed)
Placed on cont cardiac monitor with int NBP q 30 min, with cont POX.

## 2016-06-06 ENCOUNTER — Telehealth: Payer: Self-pay | Admitting: *Deleted

## 2016-06-06 NOTE — Telephone Encounter (Signed)
New message    1. Has your device fired? no  2. Is you device beeping? no  3. Are you experiencing draining or swelling at device site? no  4. Are you calling to see if we received your device transmission? Yes - pt states she cut the defib off to go to her daughters house and cut it back on when she got home and now it is reading 04/09/16. She is not sure if it is working properly enough and requests a call back. She said it is confusing her.  5. Have you passed out? no

## 2016-06-06 NOTE — Telephone Encounter (Signed)
Spoke w/ pt and instructed her how to send a remote transmission. Transmission was successful. Pt aware.

## 2016-06-06 NOTE — Telephone Encounter (Signed)
See phone note from 06/03/16 patient was advised by PCP to increase lisinopril to 20 mg daily . Patient was made aware by nurse.

## 2016-06-07 ENCOUNTER — Other Ambulatory Visit: Payer: Self-pay | Admitting: Internal Medicine

## 2016-06-07 DIAGNOSIS — Z5111 Encounter for antineoplastic chemotherapy: Secondary | ICD-10-CM | POA: Diagnosis not present

## 2016-06-07 DIAGNOSIS — C671 Malignant neoplasm of dome of bladder: Secondary | ICD-10-CM | POA: Diagnosis not present

## 2016-06-07 NOTE — Telephone Encounter (Signed)
Last refill 05/24/16 Patient ws seen in ER 06/03/16 Last office visit 05/24/16 Please advise

## 2016-06-08 NOTE — Telephone Encounter (Signed)
She was seen for hypertension and no changes were made ,,  If she has been taking 20 mg lisinopril for over a week and bps are still high,  I need to know.    If she has gone through 20 phenergan in 15 days , you can refill,  But she will need a follow up appt soon to figure out why she is till needing them.  ,  Does she have one?

## 2016-06-08 NOTE — Telephone Encounter (Addendum)
Patient states she called in refill by mistake she doesn't need refill.

## 2016-06-09 ENCOUNTER — Telehealth: Payer: Self-pay

## 2016-06-09 DIAGNOSIS — E871 Hypo-osmolality and hyponatremia: Secondary | ICD-10-CM

## 2016-06-09 DIAGNOSIS — I420 Dilated cardiomyopathy: Secondary | ICD-10-CM

## 2016-06-09 MED ORDER — LISINOPRIL 40 MG PO TABS: 40.0000 mg | ORAL_TABLET | Freq: Every day | ORAL | 5 refills | Status: DC

## 2016-06-09 NOTE — Telephone Encounter (Signed)
No, she should NOT RESUME the spironolactone  And she should increase the lisinopril to 40 mg daily and repeat a BMET and BP check in one week

## 2016-06-09 NOTE — Telephone Encounter (Signed)
Patient calls with Blood pressure readings as follows on Lisinopril 20 mg patient complains of slight headache comes and goes Sun 155/96 HR 67 Mon 204/117 HR 70 Tue 174/99 HR 80 Wed 175/107 HR 80 Thur 182/103 HR 72 ,  Has appointment with cardiology in 06/2016 Dr Fletcher Anon  No appointment scheduled.   Please advise.

## 2016-06-09 NOTE — Telephone Encounter (Signed)
Pt called back and stated that she forgot to add that she notices that when her bp is high that her heart beat a little harder. She states not a lot but it is noticeable. She would also like to know if she should continue to be off her spironolactone (ALDACTONE) 25 MG tablet or can she start retaking this medication. Please advise, thank you!

## 2016-06-09 NOTE — Telephone Encounter (Signed)
Patient advised of below and verbalized an understanding  

## 2016-06-14 ENCOUNTER — Telehealth: Payer: Self-pay | Admitting: Internal Medicine

## 2016-06-14 DIAGNOSIS — Z5111 Encounter for antineoplastic chemotherapy: Secondary | ICD-10-CM | POA: Diagnosis not present

## 2016-06-14 DIAGNOSIS — C671 Malignant neoplasm of dome of bladder: Secondary | ICD-10-CM | POA: Diagnosis not present

## 2016-06-14 NOTE — Telephone Encounter (Signed)
Pt called back and wanted to know what you were calling in regards to. Please advise, thank you!  Call pt @ [phone] (may leave a msg)

## 2016-06-15 NOTE — Telephone Encounter (Signed)
Spoke with pt and informed her that there was a miscommunication with her daughter. I had called the daughter and left a message and she assumed that it was for her mother.

## 2016-06-16 ENCOUNTER — Other Ambulatory Visit (INDEPENDENT_AMBULATORY_CARE_PROVIDER_SITE_OTHER): Payer: Medicare Other

## 2016-06-16 ENCOUNTER — Ambulatory Visit (INDEPENDENT_AMBULATORY_CARE_PROVIDER_SITE_OTHER): Payer: Medicare Other | Admitting: *Deleted

## 2016-06-16 ENCOUNTER — Encounter: Payer: Self-pay | Admitting: *Deleted

## 2016-06-16 VITALS — BP 130/80 | HR 78 | Resp 14

## 2016-06-16 DIAGNOSIS — E871 Hypo-osmolality and hyponatremia: Secondary | ICD-10-CM

## 2016-06-16 DIAGNOSIS — R030 Elevated blood-pressure reading, without diagnosis of hypertension: Secondary | ICD-10-CM | POA: Diagnosis not present

## 2016-06-16 LAB — BASIC METABOLIC PANEL
BUN: 10 mg/dL (ref 6–23)
CALCIUM: 9.9 mg/dL (ref 8.4–10.5)
CO2: 31 mEq/L (ref 19–32)
CREATININE: 0.67 mg/dL (ref 0.40–1.20)
Chloride: 98 mEq/L (ref 96–112)
GFR: 90.66 mL/min (ref >60.00)
GLUCOSE: 109 mg/dL — AB (ref 70–99)
POTASSIUM: 4.6 meq/L (ref 3.5–5.1)
Sodium: 133 mEq/L — ABNORMAL LOW (ref 135–145)

## 2016-06-16 NOTE — Progress Notes (Addendum)
Patient came into office for one week follow up on BP with nurse after starting lisinopril 40 mg . BP in left arm 130/82 pulse 79 , BP in right Arm 130/80 pulse 78. Patient also gave log of home BP readings placed in yellow results folder.  Reviewed above.  Blood pressure ok.   Dr Nicki Reaper

## 2016-06-19 ENCOUNTER — Telehealth: Payer: Self-pay | Admitting: Internal Medicine

## 2016-06-19 MED ORDER — AMLODIPINE BESYLATE 5 MG PO TABS: 5.0000 mg | ORAL_TABLET | Freq: Every day | ORAL | 3 refills | Status: DC

## 2016-06-19 NOTE — Telephone Encounter (Signed)
Home bps reviewed and very high.  Adding amlodipine 5 mg daily to regimen of lisinopril and metoprolol.  Tc one week with hoe cuff and home readings for bp check .  rx sent to [facility] in [place]

## 2016-06-20 ENCOUNTER — Telehealth: Payer: Self-pay

## 2016-06-20 MED ORDER — AMLODIPINE BESYLATE 5 MG PO TABS: 5.0000 mg | ORAL_TABLET | Freq: Every day | ORAL | 3 refills | Status: DC

## 2016-06-20 NOTE — Telephone Encounter (Signed)
Patient notified and voiced understanding.

## 2016-06-20 NOTE — Telephone Encounter (Signed)
Patient is inquiring on referral to neurosurgery.   I advised patient referral had been submitted through [facility] portal .  Please advise.

## 2016-06-20 NOTE — Telephone Encounter (Signed)
Called patient and patient voiced understanding to adding amlodipine to regimen for BP patient is scheduled for return nurse visit in one week . Patient ask while on phone if PCP would prescribe Diclofenac 1% gel for patient back , she said a friend advised her of how much it helped her knees.

## 2016-06-20 NOTE — Telephone Encounter (Signed)
No, I cannot.  This gel is an antiinflammatory and is absorbed systemically so with her history of gastric ulcer ,she should not take it.

## 2016-06-21 ENCOUNTER — Ambulatory Visit (INDEPENDENT_AMBULATORY_CARE_PROVIDER_SITE_OTHER): Payer: Medicare Other | Admitting: *Deleted

## 2016-06-21 DIAGNOSIS — I428 Other cardiomyopathies: Secondary | ICD-10-CM

## 2016-06-21 NOTE — Progress Notes (Signed)
Remote ICD transmission.   

## 2016-06-22 ENCOUNTER — Encounter: Payer: Self-pay | Admitting: Cardiology

## 2016-06-22 LAB — CUP PACEART REMOTE DEVICE CHECK
Battery Remaining Longevity: 62 mo
Brady Statistic AP VP Percent: 16.53 %
Brady Statistic AP VS Percent: 0.26 %
Brady Statistic AS VP Percent: 81.92 %
Brady Statistic RA Percent Paced: 16.44 %
Brady Statistic RV Percent Paced: 92.98 %
HighPow Impedance: 68 Ohm
Implantable Lead Implant Date: 20160217
Implantable Lead Implant Date: 20160217
Implantable Lead Location: 753858
Implantable Lead Location: 753860
Implantable Lead Model: 5076
Lead Channel Impedance Value: 342 Ohm
Lead Channel Impedance Value: 342 Ohm
Lead Channel Impedance Value: 399 Ohm
Lead Channel Impedance Value: 399 Ohm
Lead Channel Impedance Value: 589 Ohm
Lead Channel Impedance Value: 608 Ohm
Lead Channel Pacing Threshold Amplitude: 1 V
Lead Channel Pacing Threshold Pulse Width: 0.4 ms
Lead Channel Pacing Threshold Pulse Width: 0.4 ms
Lead Channel Pacing Threshold Pulse Width: 0.4 ms
Lead Channel Sensing Intrinsic Amplitude: 16.625 mV
Lead Channel Sensing Intrinsic Amplitude: 16.625 mV
Lead Channel Sensing Intrinsic Amplitude: 2.75 mV
Lead Channel Sensing Intrinsic Amplitude: 2.75 mV
Lead Channel Setting Pacing Amplitude: 2 V
Lead Channel Setting Pacing Amplitude: 2.25 V
Lead Channel Setting Pacing Pulse Width: 0.4 ms
Lead Channel Setting Pacing Pulse Width: 0.4 ms
MDC IDC LEAD IMPLANT DT: 20160217
MDC IDC LEAD LOCATION: 753859
MDC IDC MSMT BATTERY VOLTAGE: 2.98 V
MDC IDC MSMT LEADCHNL LV IMPEDANCE VALUE: 342 Ohm
MDC IDC MSMT LEADCHNL LV IMPEDANCE VALUE: 418 Ohm
MDC IDC MSMT LEADCHNL LV IMPEDANCE VALUE: 551 Ohm
MDC IDC MSMT LEADCHNL LV IMPEDANCE VALUE: 608 Ohm
MDC IDC MSMT LEADCHNL LV IMPEDANCE VALUE: 646 Ohm
MDC IDC MSMT LEADCHNL RA PACING THRESHOLD AMPLITUDE: 0.625 V
MDC IDC MSMT LEADCHNL RV IMPEDANCE VALUE: 361 Ohm
MDC IDC MSMT LEADCHNL RV IMPEDANCE VALUE: 475 Ohm
MDC IDC MSMT LEADCHNL RV PACING THRESHOLD AMPLITUDE: 0.75 V
MDC IDC PG IMPLANT DT: 20160217
MDC IDC SESS DTM: 20180403084222
MDC IDC SET LEADCHNL RV PACING AMPLITUDE: 2.5 V
MDC IDC SET LEADCHNL RV SENSING SENSITIVITY: 0.3 mV
MDC IDC STAT BRADY AS VS PERCENT: 1.3 %

## 2016-06-28 ENCOUNTER — Encounter: Payer: Self-pay | Admitting: Cardiovascular Disease

## 2016-06-28 ENCOUNTER — Ambulatory Visit (INDEPENDENT_AMBULATORY_CARE_PROVIDER_SITE_OTHER): Payer: Medicare Other | Admitting: Cardiovascular Disease

## 2016-06-28 VITALS — BP 134/96 | HR 68 | Ht 68.0 in | Wt 120.0 lb

## 2016-06-28 DIAGNOSIS — Z720 Tobacco use: Secondary | ICD-10-CM | POA: Diagnosis not present

## 2016-06-28 DIAGNOSIS — I5022 Chronic systolic (congestive) heart failure: Secondary | ICD-10-CM | POA: Diagnosis not present

## 2016-06-28 DIAGNOSIS — I428 Other cardiomyopathies: Secondary | ICD-10-CM | POA: Diagnosis not present

## 2016-06-28 DIAGNOSIS — I10 Essential (primary) hypertension: Secondary | ICD-10-CM | POA: Diagnosis not present

## 2016-06-28 NOTE — Progress Notes (Signed)
Cardiology Office Note   Date:  06/28/2016   ID:  [name], DOB 04-Mar-1941, MRN [id number]  PCP:  [name], MD  Cardiologist:   Kathlyn Sacramento, MD   Chief Complaint  Patient presents with  . Follow-up    6 month followup, no pain or cramping in legs, lightheaded or dizziness, chest pain; has little edema in feet      History of Present Illness:  [name] is a 76 y.o. female who presents for a follow-up visit regarding chronic systolic heart failure due to nonischemic cardiomyopathy.  Previous ejection fraction was 20% in 2015.  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 She continued to have New York Heart Association class III symptoms . She underwent ICD CRT placement by Dr. Caryl Comes in 04/2014 . She responded very well after that. Echocardiogram in October 2017 showed normalization of LV systolic function. She has known history of bladder cancer that was treated. She reports chronic back pain which limits her activities. She continues to smoke 10 cigarettes per day. She had problems recently with nausea and recurrent hyponatremia. She is not taking spironolactone. Blood pressure has been higher since then. She does have purplish and reddish discoloration of both feet. ABI was done last year and was normal.    Past Medical History:  Diagnosis Date  . AICD (automatic cardioverter/defibrillator) present   . Anemia   . Arthritis    "in about all my joints; for sure in my back"  . Bladder cancer ([place]) 1995; 2015  . CHF (congestive heart failure) ([place])   . Chronic lower back pain   . COPD (chronic obstructive pulmonary disease) ([place])   . Ectopic atrial beats   . Frequent urination   . GERD (gastroesophageal reflux disease)    h/o ulcers  . History of stomach ulcers 11/2013  . Hypertension   . LBBB (left bundle branch block)   . Migraines    "stopped before  menopause"    Past Surgical History:  Procedure Laterality Date  . BACK SURGERY    . BI-VENTRICULAR IMPLANTABLE CARDIOVERTER DEFIBRILLATOR N/A 05/07/2014   Procedure: BI-VENTRICULAR IMPLANTABLE CARDIOVERTER DEFIBRILLATOR  (CRT-D);  Surgeon: Deboraha Sprang, MD;  Location: [facility] CATH LAB;  Service: Cardiovascular;  Laterality: N/A;  . CARDIAC CATHETERIZATION  ~ 02/2014  . CARDIAC DEFIBRILLATOR PLACEMENT  05/07/2014   dual chamber  . CATARACT EXTRACTION W/ INTRAOCULAR LENS  IMPLANT, BILATERAL Bilateral 12-2013 - 02-2014  . CYSTOSCOPY W/ RETROGRADES Bilateral 01/06/2015   Procedure: CYSTOSCOPY WITH  BLADDER BIOPSY BILATERAL RETROGRADE PYELOGRAM,INSTILLATION OF MITOMYCIN C;  Surgeon: [name], MD;  Location: [facility];  Service: Urology;  Laterality: Bilateral;  . CYSTOSCOPY WITH BIOPSY N/A 10/06/2015   Procedure: CYSTO WITH BLADDER BIOPSY, FULGERATION, CHEMO IRRIGATION EPIRUBICIN IN PACU;  Surgeon: [name], MD;  Location: [facility];  Service: Urology;  Laterality: N/A;  . CYSTOSCOPY WITH RETROGRADE PYELOGRAM, URETEROSCOPY AND STENT PLACEMENT Bilateral 04/18/2014   Procedure: CYSTOSCOPY WITH RETROGRADE PYELOGRAM;  Surgeon: [name], MD;  Location: [facility];  Service: Urology;  Laterality: Bilateral;  . ESOPHAGOGASTRODUODENOSCOPY N/A 11/28/2013   Procedure: ESOPHAGOGASTRODUODENOSCOPY (EGD);  Surgeon: Arta Silence, MD;  Location: [facility] ENDOSCOPY;  Service: Endoscopy;  Laterality: N/A;  . LUMBAR [place]  1980's   "ruptured disc"  . TRANSURETHRAL RESECTION OF BLADDER TUMOR N/A 12/13/2013   Procedure: TRANSURETHRAL RESECTION OF BLADDER TUMOR (TURBT);  Surgeon: [name], MD;  Location:  [facility];  Service: Urology;  Laterality: N/A;  . TRANSURETHRAL RESECTION OF BLADDER TUMOR N/A 01/17/2014   Procedure: TRANSURETHRAL RESECTION OF BLADDER TUMOR (TURBT);  Surgeon: [name], MD;  Location: [facility];  Service: Urology;  Laterality: N/A;  . TRANSURETHRAL RESECTION OF BLADDER TUMOR  N/A 04/18/2014   Procedure: TRANSURETHRAL RESECTION OF BLADDER TUMOR (TURBT), CYSTOGRAM;  Surgeon: [name], MD;  Location: [facility];  Service: Urology;  Laterality: N/A;     Current Outpatient Prescriptions  Medication Sig Dispense Refill  . amLODipine (NORVASC) 5 MG tablet Take 1 tablet (5 mg total) by mouth daily. 30 tablet 3  . DULoxetine (CYMBALTA) 30 MG capsule Take 1 capsule (30 mg total) by mouth daily. 30 capsule 2  . gabapentin (NEURONTIN) 300 MG capsule Take 1 capsule by mouth 2 (two) times daily.    Marland Kitchen HYDROcodone-acetaminophen (NORCO) 10-325 MG per tablet Take 1 tablet by mouth every 6 (six) hours as needed (Pain).    Marland Kitchen lisinopril (PRINIVIL,ZESTRIL) 40 MG tablet Take 1 tablet (40 mg total) by mouth daily. 30 tablet 5  . metoprolol succinate (TOPROL-XL) 25 MG 24 hr tablet Take 1 tablet (25 mg total) by mouth daily. 30 tablet 8  . Multiple Vitamin (MULTIVITAMIN WITH MINERALS) TABS tablet Take 1 tablet by mouth daily.    . pantoprazole (PROTONIX) 40 MG tablet Take 1 tablet (40 mg total) by mouth 2 (two) times daily. 60 tablet 2  . polyethylene glycol (MIRALAX / GLYCOLAX) packet Take 17 g by mouth daily as needed for mild constipation.     . promethazine (PHENERGAN) 12.5 MG tablet TAKE 1 TABLET BY MOUTH EVERY 8 HOURS AS NEEDED FOR NAUSEA OR VOMITING 60 tablet 0  . sucralfate (CARAFATE) 1 g tablet Take 1 tablet (1 g total) by mouth 4 (four) times daily -  with meals and at bedtime. 120 tablet 2   No current facility-administered medications for this visit.     Allergies:   Patient has no known allergies.    Social History:  The patient  reports that she has been smoking Cigarettes.  She has a 27.50 pack-year smoking history. She has never used smokeless tobacco. She reports that she does not drink alcohol or use drugs.   Family History:  The patient's family history includes Cancer in her brother and mother.    ROS:  Please see the history of present illness.   Otherwise,  review of systems are positive for none.   All other systems are reviewed and negative.    PHYSICAL EXAM: VS:  BP (!) 134/96   Pulse 68   Ht 5\' 8"  (1.727 m)   Wt 120 lb (54.4 kg)   BMI 18.25 kg/m  , BMI Body mass index is 18.25 kg/m. GEN: Well nourished, well developed, in no acute distress  HEENT: normal  Neck: no JVD, carotid bruits, or masses Cardiac: RRR; no murmurs, rubs, or gallops,no edema  Respiratory:  clear to auscultation bilaterally, normal work of breathing GI: soft, nontender, nondistended, + BS MS: no deformity or atrophy  Skin: warm and dry, no rash Neuro:  Strength and sensation are intact Psych: euthymic mood, full affect Vascular: Femoral pulses +2 on the right side and +1 on the left. Dorsalis pedis is +2 bilaterally. Posterior tibial is absent bilaterally. There is reddish discoloration of both feet with no warmth or signs of cellulitis.  EKG:  EKG is not ordered today.    Recent Labs: 10/02/2015: Hemoglobin 13.5; Platelets 209 05/24/2016: ALT 15  06/16/2016: BUN 10; Creatinine, Ser 0.67; Potassium 4.6; Sodium 133    Lipid Panel    Component Value Date/Time   CHOL 175 08/31/2010 1740   TRIG 67 08/31/2010 1740   HDL 52 08/31/2010 1740   CHOLHDL 3.4 08/31/2010 1740   VLDL 13 08/31/2010 1740   LDLCALC 110 (H) 08/31/2010 1740      Wt Readings from Last 3 Encounters:  06/28/16 120 lb (54.4 kg)  06/03/16 124 lb (56.2 kg)  05/24/16 124 lb 3.2 oz (56.3 kg)       ASSESSMENT AND PLAN:  1.  Chronic systolic heart failure: Due to nonischemic cardiomyopathy. Currently New York Heart Association class II. She is on optimal medical therapy.  Most recent ejection fraction was normal.  2. Tobacco use: I had a prolonged discussion with her about the importance of smoking cessation.  3. Status post ICD/CRT placement: Followed by Dr. Caryl Comes.  4. Essential hypertension: Blood pressure is mildly elevated. She is no longer on spironolactone and I cannot find the  reason in the chart. She did have recent GI symptoms with hyponatremia and possibly the medication was stopped for that reason.  5. Discoloration of both feet of unclear etiology. The appearance is not typical for stasis dermatitis and arterial vascular studies were normal. Consider dermatology consultation.  Disposition:   FU with me in 6 months  Signed,  Kathlyn Sacramento, MD  06/28/2016 11:51 AM    [place]

## 2016-06-28 NOTE — Patient Instructions (Signed)

## 2016-06-29 ENCOUNTER — Ambulatory Visit (INDEPENDENT_AMBULATORY_CARE_PROVIDER_SITE_OTHER): Payer: Medicare Other | Admitting: *Deleted

## 2016-06-29 ENCOUNTER — Encounter: Payer: Self-pay | Admitting: *Deleted

## 2016-06-29 VITALS — BP 140/78 | HR 70 | Resp 14

## 2016-06-29 DIAGNOSIS — E871 Hypo-osmolality and hyponatremia: Secondary | ICD-10-CM

## 2016-06-29 DIAGNOSIS — R030 Elevated blood-pressure reading, without diagnosis of hypertension: Secondary | ICD-10-CM | POA: Diagnosis not present

## 2016-06-29 NOTE — Progress Notes (Signed)
Patient presented for one week BP check after starting Amlodipine 5 mg, BP was attained according to nursing standards . Left arm 148/88 pulse 75 after appropriate wait time right arm 140/78 pulse 70 . Patient had only taken Amlodipine 5 mg she decided to day to start taking lisinopril at night, no reason just decided to change regimen.  Patient during [place] had several questions: 1. Patient saw Dr. Fletcher Anon on 06/28/16 and was advised was she taking her Spironolactone and patient advised him she had stooped she thought PCP had  D/C medication . Dr. Fletcher Anon advised patient he did not see that in PCP notes.  Patient ask should sh restart this medication?  2. Patient ask if referral for Neurology had been set up at [facility]? Patient has not heard anything.  3. Patient ask since she is moving to [place] to live with daughter would PCP fill her hydrocodone, patient is willing to make and appointment if needed.  Home cuff was comparable to reading in office within 5 points higher.

## 2016-07-01 ENCOUNTER — Telehealth: Payer: Self-pay | Admitting: Internal Medicine

## 2016-07-01 MED ORDER — AMLODIPINE BESYLATE 5 MG PO TABS: 5.0000 mg | ORAL_TABLET | Freq: Two times a day (BID) | ORAL | 3 refills | Status: DC

## 2016-07-01 NOTE — Telephone Encounter (Signed)
BP READINGS REVIEWED.  HER MORNIGN READINGS ARE TOO HIGH,  ADD A SECOND DOSE OF AMLODIPINE 5 MG AT BEDTIME   WIL SEND NEW RX TO PHARMACY

## 2016-07-01 NOTE — Telephone Encounter (Signed)
Spoke with pt and informed her of her medication change based off of her bp readings. Pt gave a verbal understanding.

## 2016-07-01 NOTE — Progress Notes (Signed)
Patient was referred to [facility] on Feb 28.  Ask Melissa to follow up on referral  spironolactone was stopped during her episode of severe hyponatremia and dehydration ..    I will refill the hydrocodone but she will have to see me every 3 months for medication refills, and she wil have to sign a narcotics contract.

## 2016-07-01 NOTE — Assessment & Plan Note (Signed)
Improved with suspension of spironolactone and increased water intake.

## 2016-07-04 ENCOUNTER — Telehealth: Payer: Self-pay | Admitting: *Deleted

## 2016-07-04 NOTE — Telephone Encounter (Signed)
Called pt and she stated that the doctor that was prescribing her pain medication is no longer at the facility. Pt stated that she runs out of her medication this Friday. The medication is hydrocodone. Will you be willing to prescribe this medication for the pt?

## 2016-07-04 NOTE — Telephone Encounter (Signed)
Yes, but she will need to make an appt you can use an 11:30 or 4:30 slot this week

## 2016-07-04 NOTE — Telephone Encounter (Signed)
Patient has requested a call in reference to a pain medication for her back. Pt has requested hydrocodone .  Pt contact [phone]

## 2016-07-05 NOTE — Telephone Encounter (Signed)
Spoke with pt and she stated that [facility] contacted her this morning and scheduled the appt for her. Pt will be here Wednesday at 11:30am.

## 2016-07-05 NOTE — Progress Notes (Signed)
Patient notified and voiced understanding already has follow up appointment.

## 2016-07-05 NOTE — Telephone Encounter (Signed)
Left patient a message to return my call to schedule an appt this week. thanks

## 2016-07-06 ENCOUNTER — Ambulatory Visit (INDEPENDENT_AMBULATORY_CARE_PROVIDER_SITE_OTHER): Payer: Medicare Other | Admitting: Internal Medicine

## 2016-07-06 ENCOUNTER — Encounter: Payer: Self-pay | Admitting: Internal Medicine

## 2016-07-06 VITALS — BP 112/70 | HR 79 | Temp 97.7°F | Resp 16 | Ht 68.0 in | Wt 124.8 lb

## 2016-07-06 DIAGNOSIS — M4805 Spinal stenosis, thoracolumbar region: Secondary | ICD-10-CM | POA: Diagnosis not present

## 2016-07-06 DIAGNOSIS — I420 Dilated cardiomyopathy: Secondary | ICD-10-CM

## 2016-07-06 DIAGNOSIS — M5440 Lumbago with sciatica, unspecified side: Secondary | ICD-10-CM | POA: Diagnosis not present

## 2016-07-06 DIAGNOSIS — G8929 Other chronic pain: Secondary | ICD-10-CM

## 2016-07-06 DIAGNOSIS — T39395A Adverse effect of other nonsteroidal anti-inflammatory drugs [NSAID], initial encounter: Secondary | ICD-10-CM | POA: Diagnosis not present

## 2016-07-06 DIAGNOSIS — K2960 Other gastritis without bleeding: Secondary | ICD-10-CM

## 2016-07-06 DIAGNOSIS — E871 Hypo-osmolality and hyponatremia: Secondary | ICD-10-CM

## 2016-07-06 LAB — BASIC METABOLIC PANEL
BUN: 12 mg/dL (ref 6–23)
CALCIUM: 10.1 mg/dL (ref 8.4–10.5)
CO2: 29 meq/L (ref 19–32)
CREATININE: 0.71 mg/dL (ref 0.40–1.20)
Chloride: 100 mEq/L (ref 96–112)
GFR: 84.78 mL/min (ref >60.00)
GLUCOSE: 76 mg/dL (ref 70–99)
Potassium: 4.5 mEq/L (ref 3.5–5.1)
Sodium: 132 mEq/L — ABNORMAL LOW (ref 135–145)

## 2016-07-06 MED ORDER — HYDROCODONE-ACETAMINOPHEN 10-325 MG PO TABS: 1.0000 | ORAL_TABLET | Freq: Four times a day (QID) | ORAL | 0 refills | Status: DC | PRN

## 2016-07-06 NOTE — Progress Notes (Signed)
Subjective:  Patient ID: [name], female    DOB: 1940-11-30  Age: 76 y.o. MRN: [id number]  CC: The primary encounter diagnosis was Hyponatremia. Diagnoses of Chronic bilateral low back pain with sciatica, sciatica laterality unspecified, Spinal stenosis of thoracolumbar region, Congestive dilated cardiomyopathy ([place]), and NSAID induced gastritis were also pertinent to this visit.  HPI BHAVYA ESCHETE presents for chronic pain management.  History  Of spinal stenosis affecting the thoracolumbar and cervical region.Last MRI was  Oct and Nov 2015 when she saw Dr Sherwood Gambler who did not recommend surgery due  to medical issues including cardiomyopathy  COPD and bladder cancer.  NSAIDs are CI due to history of gastric ulcer  Has been treated with hdyrocodone from prior PCP taking every 6 hours (averaging 4 daiy) 'would like to take it more frequently .  Thinks the name brand Vicodin works better and requesting [place].    Did not think gabapentin 300 mg has helped much,  was taking it between dose of  the hydrocodone,  This week has started taking it only in the evening   FEET SWELLING,  But LESS PURPLE,  REVIEWED [facility]  And most recent ECHO.   [place] discouraged given recent episode of severe symptomatic hyponatremia  And dehydration .   Drinking 40 ounces of gatorade daily to maintain sodium level.   Has not heard about the neurosurgical referral placed feb 28    Outpatient Medications Prior to Visit  Medication Sig Dispense Refill  . amLODipine (NORVASC) 5 MG tablet Take 1 tablet (5 mg total) by mouth 2 (two) times daily. 60 tablet 3  . DULoxetine (CYMBALTA) 30 MG capsule Take 1 capsule (30 mg total) by mouth daily. 30 capsule 2  . gabapentin (NEURONTIN) 300 MG capsule Take 1 capsule by mouth 2 (two) times daily.    Marland Kitchen HYDROcodone-acetaminophen (NORCO) 10-325 MG per tablet Take 1 tablet by mouth every 6 (six) hours as needed (Pain).    Marland Kitchen lisinopril (PRINIVIL,ZESTRIL) 40 MG tablet  Take 1 tablet (40 mg total) by mouth daily. 30 tablet 5  . metoprolol succinate (TOPROL-XL) 25 MG 24 hr tablet Take 1 tablet (25 mg total) by mouth daily. 30 tablet 8  . Multiple Vitamin (MULTIVITAMIN WITH MINERALS) TABS tablet Take 1 tablet by mouth daily.    . pantoprazole (PROTONIX) 40 MG tablet Take 1 tablet (40 mg total) by mouth 2 (two) times daily. 60 tablet 2  . polyethylene glycol (MIRALAX / GLYCOLAX) packet Take 17 g by mouth daily as needed for mild constipation.     . promethazine (PHENERGAN) 12.5 MG tablet TAKE 1 TABLET BY MOUTH EVERY 8 HOURS AS NEEDED FOR NAUSEA OR VOMITING 60 tablet 0  . sucralfate (CARAFATE) 1 g tablet Take 1 tablet (1 g total) by mouth 4 (four) times daily -  with meals and at bedtime. 120 tablet 2   No facility-administered medications prior to visit.     Review of Systems;  Patient denies headache, fevers, malaise, unintentional weight loss, skin rash, eye pain, sinus congestion and sinus pain, sore throat, dysphagia,  hemoptysis , cough, dyspnea, wheezing, chest pain, palpitations, orthopnea, edema, abdominal pain, nausea, melena, diarrhea, constipation, flank pain, dysuria, hematuria, urinary  Frequency, nocturia, numbness, tingling, seizures,  Focal weakness, Loss of consciousness,  Tremor, insomnia, depression, anxiety, and suicidal ideation.      Objective:  BP 112/70   Pulse 79   Temp 97.7 F (36.5 C) (Oral)   Resp 16  Ht 5\' 8"  (1.727 m)   Wt 124 lb 12.8 oz (56.6 kg)   SpO2 99%   BMI 18.98 kg/m   BP Readings from Last 3 Encounters:  07/06/16 112/70  06/29/16 140/78  06/28/16 (!) 134/96    Wt Readings from Last 3 Encounters:  07/06/16 124 lb 12.8 oz (56.6 kg)  06/28/16 120 lb (54.4 kg)  06/03/16 124 lb (56.2 kg)    General appearance: alert, cooperative and appears stated age Ears: normal TM's and external ear canals both ears Throat: lips, mucosa, and tongue normal; teeth and gums normal Neck: no adenopathy, no carotid bruit,  supple, symmetrical, trachea midline and thyroid not enlarged, symmetric, no tenderness/mass/nodules Back: symmetric, no curvature. ROM normal. No CVA tenderness. Lungs: clear to auscultation bilaterally Heart: regular rate and rhythm, S1, S2 normal, no murmur, click, rub or gallop Abdomen: soft, non-tender; bowel sounds normal; no masses,  no organomegaly Pulses: 2+ and symmetric Skin: Skin color, texture, turgor normal. No rashes or lesions Lymph nodes: Cervical, supraclavicular, and axillary nodes normal.  Lab Results  Component Value Date   HGBA1C 4.6 12/27/2013   HGBA1C 5.7 (H) 08/31/2010    Lab Results  Component Value Date   CREATININE 0.71 07/06/2016   CREATININE 0.67 06/16/2016   CREATININE 0.60 06/01/2016    Lab Results  Component Value Date   WBC 7.9 10/02/2015   HGB 13.5 10/02/2015   HCT 40.4 10/02/2015   PLT 209 10/02/2015   GLUCOSE 76 07/06/2016   CHOL 175 08/31/2010   TRIG 67 08/31/2010   HDL 52 08/31/2010   LDLCALC 110 (H) 08/31/2010   ALT 15 05/24/2016   AST 23 05/24/2016   NA 132 (L) 07/06/2016   K 4.5 07/06/2016   CL 100 07/06/2016   CREATININE 0.71 07/06/2016   BUN 12 07/06/2016   CO2 29 07/06/2016   TSH 1.700 11/27/2013   INR 1.0 04/28/2014   HGBA1C 4.6 12/27/2013    No results found.  Assessment & Plan:   Problem List Items Addressed This Visit    Chronic bilateral low back pain    Secondary to spinal stenosis confirmed by MRI done in 2015.  She was not a candidate for surgery at that tie due to untreated ischemic cardiomaopathy and bladder cancer. Her symptoms of pain  are persistent and requires daily use of vicodin , which I have agreed to refill for her until she can pursue  surgical options .  She has repeated  A CT scan of lummbar spine. She has not had any ER visits  And has not requested any early refills.  Her Refill history was confirmed via [place] Controlled Substance database by me today during her visit and there have been no  prescriptions of controlled substances filled from any providers other than her previous PCP . 30 day refill of NB Vicodin given ,  Advised to continue cymbalta and gabapentin       Relevant Medications   HYDROcodone-acetaminophen (NORCO) 10-325 MG tablet   Congestive dilated cardiomyopathy ([place])    EF normalized to 60% Oct 2017 ECHO following resynchronization with pacer.  Given her history of severe hyponatremia,  Will avoid daily use of diuretic. Her leg edema will be managed with leg elevation       Hyponatremia - Primary    Secondary to severe dehydration secondary to use of spironolactone and decreased liquid intake.  Her sodium level has improved  but remains slightly low,  Advised to refrain from taking spironolactone given  normal EF by recent ECHo,  And continue use of gatorade       Relevant Orders   Basic metabolic panel (Completed)   NSAID induced gastritis    Improved with use of bid  protonix and sucralfate       Spinal stenosis of thoracolumbar region    Severe, by repeat lumbar CT starting at T12, with persistent pain managed with daily opioids.  Neurosurgical referral in process         A total of 40 minutes was spent with patient more than half of which was spent in counseling patient on the above mentioned issues , reviewing and explaining recent labs and imaging studies done, and coordination of care. I am having Ms. Denne start on HYDROcodone-acetaminophen. I am also having her maintain her polyethylene glycol, HYDROcodone-acetaminophen, multivitamin with minerals, metoprolol succinate, gabapentin, DULoxetine, sucralfate, pantoprazole, promethazine, lisinopril, and amLODipine.  Meds ordered this encounter  Medications  . HYDROcodone-acetaminophen (NORCO) 10-325 MG tablet    Sig: Take 1 tablet by mouth every 6 (six) hours as needed for severe pain. May refill on or after July 08 2016.  PLEASE FILL AS NORCO NAME BRAND ONLY IF POSSIBLE    Dispense:  120 tablet     Refill:  0    May refill on or after July 08 2016.  PLEASE FILL AS NORCO NAME BRAND ONLY IF POSSIBLE    There are no discontinued medications.  Follow-up: No Follow-up on file.   [name], MD

## 2016-07-06 NOTE — Patient Instructions (Addendum)
Take the gabapentin at dinner time and at bedtime  I HAVE ReFILLED THE  hydrocone  As NAME BRAND ONLY ([place]) per your request,   FOR ONE MONTH.    LET ME KNOW BEFORE MAY 18 if you want to refill as generic or name brand .  [facility] can pick up the 2 months of prescriptions (may  18 and June 18 )  I will need to see you in July  prior next next refill dated July 20    Do not resume spironolactone on a daily basis  It caused your dehydration .  Use not more than once a week   You leg swelling should improve with elevation and use of compression knee highs

## 2016-07-06 NOTE — Progress Notes (Signed)
Pre visit review using our clinic review tool, if applicable. No additional management support is needed unless otherwise documented below in the visit note. 

## 2016-07-09 NOTE — Assessment & Plan Note (Signed)
EF normalized to 60% Oct 2017 ECHO following resynchronization with pacer.  Given her history of severe hyponatremia,  Will avoid daily use of diuretic. Her leg edema will be managed with leg elevation

## 2016-07-09 NOTE — Assessment & Plan Note (Addendum)
Improved with use of bid  protonix and sucralfate

## 2016-07-09 NOTE — Assessment & Plan Note (Signed)
Severe, by repeat lumbar CT starting at T12, with persistent pain managed with daily opioids.  Neurosurgical referral in process

## 2016-07-09 NOTE — Assessment & Plan Note (Addendum)
Secondary to spinal stenosis confirmed by MRI done in 2015.  She was not a candidate for surgery at that tie due to untreated ischemic cardiomaopathy and bladder cancer. Her symptoms of pain  are persistent and requires daily use of vicodin , which I have agreed to refill for her until she can pursue  surgical options .  She has repeated  A CT scan of lummbar spine. She has not had any ER visits  And has not requested any early refills.  Her Refill history was confirmed via [place] Controlled Substance database by me today during her visit and there have been no prescriptions of controlled substances filled from any providers other than her previous PCP . 30 day refill of NB Vicodin given ,  Advised to continue cymbalta and gabapentin

## 2016-07-09 NOTE — Assessment & Plan Note (Signed)
Secondary to severe dehydration secondary to use of spironolactone and decreased liquid intake.  Her sodium level has improved  but remains slightly low,  Advised to refrain from taking spironolactone given normal EF by recent ECHo,  And continue use of gatorade

## 2016-07-21 ENCOUNTER — Other Ambulatory Visit: Payer: Self-pay | Admitting: Cardiovascular Disease

## 2016-07-21 DIAGNOSIS — I420 Dilated cardiomyopathy: Secondary | ICD-10-CM

## 2016-07-27 ENCOUNTER — Telehealth: Payer: Self-pay | Admitting: Internal Medicine

## 2016-07-27 ENCOUNTER — Other Ambulatory Visit: Payer: Self-pay | Admitting: Internal Medicine

## 2016-07-27 MED ORDER — HYDROCODONE-ACETAMINOPHEN 10-325 MG PO TABS: 1.0000 | ORAL_TABLET | Freq: Four times a day (QID) | ORAL | 0 refills | Status: DC | PRN

## 2016-07-27 NOTE — Telephone Encounter (Signed)
Melissa,  Patient has not heard anything from [facility] about the neurosurgical referral placed on Feb 28th.  Can you look into it? If I need to do something let me know  Janett Billow,   can you let her kn ow that her refills on her Vicodin will be ready for pickup today 10. The first one cannot be refilled until may 20 because her last one was dispenses on April 20 per review of the [place] Controlled Substance Database?  Thanks  TT

## 2016-07-27 NOTE — Telephone Encounter (Signed)
Spoke with pt's daughter per the pt's hand written letter to let her know that the pt's rxs are ready to be picked up. Attempted to call the pt to let her know and left a message for her to call back.

## 2016-08-07 ENCOUNTER — Other Ambulatory Visit: Payer: Self-pay | Admitting: Internal Medicine

## 2016-08-18 DIAGNOSIS — M4126 Other idiopathic scoliosis, lumbar region: Secondary | ICD-10-CM | POA: Insufficient documentation

## 2016-08-18 DIAGNOSIS — M48061 Spinal stenosis, lumbar region without neurogenic claudication: Secondary | ICD-10-CM | POA: Diagnosis not present

## 2016-08-18 DIAGNOSIS — M48062 Spinal stenosis, lumbar region with neurogenic claudication: Secondary | ICD-10-CM | POA: Insufficient documentation

## 2016-08-18 DIAGNOSIS — M5416 Radiculopathy, lumbar region: Secondary | ICD-10-CM | POA: Diagnosis not present

## 2016-08-22 ENCOUNTER — Other Ambulatory Visit: Payer: Self-pay | Admitting: Internal Medicine

## 2016-08-31 DIAGNOSIS — C672 Malignant neoplasm of lateral wall of bladder: Secondary | ICD-10-CM | POA: Diagnosis not present

## 2016-09-05 ENCOUNTER — Ambulatory Visit (INDEPENDENT_AMBULATORY_CARE_PROVIDER_SITE_OTHER): Payer: Medicare Other | Admitting: Internal Medicine

## 2016-09-05 ENCOUNTER — Encounter: Payer: Self-pay | Admitting: Internal Medicine

## 2016-09-05 VITALS — BP 116/80 | HR 63 | Temp 97.8°F | Resp 15 | Ht 68.0 in | Wt 129.0 lb

## 2016-09-05 DIAGNOSIS — M5440 Lumbago with sciatica, unspecified side: Secondary | ICD-10-CM | POA: Diagnosis not present

## 2016-09-05 DIAGNOSIS — M4805 Spinal stenosis, thoracolumbar region: Secondary | ICD-10-CM | POA: Diagnosis not present

## 2016-09-05 DIAGNOSIS — R259 Unspecified abnormal involuntary movements: Secondary | ICD-10-CM

## 2016-09-05 DIAGNOSIS — I10 Essential (primary) hypertension: Secondary | ICD-10-CM | POA: Diagnosis not present

## 2016-09-05 DIAGNOSIS — G8929 Other chronic pain: Secondary | ICD-10-CM | POA: Diagnosis not present

## 2016-09-05 MED ORDER — HYDROCODONE-ACETAMINOPHEN 10-325 MG PO TABS: 1.0000 | ORAL_TABLET | Freq: Four times a day (QID) | ORAL | 0 refills | Status: DC | PRN

## 2016-09-05 NOTE — Progress Notes (Signed)
Subjective:  Patient ID: [name], female    DOB: [birth year]  Age: 76 y.o. MRN: [id number]  CC: The primary encounter diagnosis was Spinal stenosis of thoracolumbar region. Diagnoses of Chronic bilateral low back pain with sciatica, sciatica laterality unspecified, Parkinsonian features, and Essential hypertension were also pertinent to this visit.  HPI [name] presents for follow up on  Multiple issues including chronic low back pain secondary to spinal stenosis  with recent (second)  Neurosurgical opinion obtained  At [facility]  Dr March Rummage at [facility],  Surgical correction was discussed with patient and daughter Mervin Kung, but she was advised that it would be a very complicated surgery due ot her concurrent scoliosis which would require additional imaging and evaluations in order to plan the surgery.  Referral to Pain clinic, neurology and  PT  Were dicussed but not done    She has decided to accept referrals after reviewing Dr Price's recommendations and comments with me today. Her preferences are:     [facility]  [place] [facility] [phone] in [place] to see neurologist in [place] for the following symptoms  Handwriting has become much worse.  Balance is worse. Has trouble standing in place , some trouble initiating walking .  Starts to fall backward when she is walking slowly   Gait is unsteady,  Loses balance frequently,  Has to concentrate on lifting feet up,    Hypertension:  Home log of BP reviewed.  Taking amlodipine , lisinopril and metoprolol . No changes to regimen   Outpatient Medications Prior to Visit  Medication Sig Dispense Refill  . amLODipine (NORVASC) 5 MG tablet Take 1 tablet (5 mg total) by mouth 2 (two) times daily. 60 tablet 3  . DULoxetine (CYMBALTA) 30 MG capsule TAKE 1 CAPSULE(30 MG) BY MOUTH DAILY 30 capsule 0  . gabapentin (NEURONTIN) 300 MG capsule TAKE 1 CAPSULE BY MOUTH TWICE DAILY EVERY NIGHT AT BEDTIME  AS DIRECTED 60 capsule 0  . lisinopril (PRINIVIL,ZESTRIL) 40 MG tablet Take 1 tablet (40 mg total) by mouth daily. 30 tablet 5  . metoprolol succinate (TOPROL-XL) 25 MG 24 hr tablet TAKE 1 TABLET BY MOUTH EVERY DAY 30 tablet 3  . Multiple Vitamin (MULTIVITAMIN WITH MINERALS) TABS tablet Take 1 tablet by mouth daily.    . pantoprazole (PROTONIX) 40 MG tablet Take 1 tablet (40 mg total) by mouth 2 (two) times daily. 60 tablet 2  . polyethylene glycol (MIRALAX / GLYCOLAX) packet Take 17 g by mouth daily as needed for mild constipation.     . promethazine (PHENERGAN) 12.5 MG tablet TAKE 1 TABLET BY MOUTH EVERY 8 HOURS AS NEEDED FOR NAUSEA OR VOMITING 60 tablet 0  . sucralfate (CARAFATE) 1 g tablet Take 1 tablet (1 g total) by mouth 4 (four) times daily -  with meals and at bedtime. 120 tablet 2  . HYDROcodone-acetaminophen (NORCO) 10-325 MG tablet Take 1 tablet by mouth every 6 (six) hours as needed for severe pain. 120 tablet 0  . HYDROcodone-acetaminophen (NORCO) 10-325 MG tablet Take 1 tablet by mouth every 6 (six) hours as needed for severe pain (Pain). 120 tablet 0   No facility-administered medications prior to visit.     Review of Systems;  Patient denies headache, fevers, malaise, unintentional weight loss, skin rash, eye pain, sinus congestion and sinus pain, sore throat, dysphagia,  hemoptysis , cough, dyspnea, wheezing, chest pain, palpitations, orthopnea,  edema, abdominal pain, nausea, melena, diarrhea, constipation, flank pain, dysuria, hematuria, urinary  Frequency, nocturia, numbness, tingling, seizures,  Focal weakness, Loss of consciousness,  Tremor, insomnia, depression, anxiety, and suicidal ideation.      Objective:  BP 116/80 (BP Location: Left Arm, Patient Position: Sitting, Cuff Size: Normal)   Pulse 63   Temp 97.8 F (36.6 C) (Oral)   Resp 15   Ht 5\' 8"  (1.727 m)   Wt 129 lb (58.5 kg)   SpO2 96%   BMI 19.61 kg/m   BP Readings from Last 3 Encounters:  09/05/16  116/80  07/06/16 112/70  06/29/16 140/78    Wt Readings from Last 3 Encounters:  09/05/16 129 lb (58.5 kg)  07/06/16 124 lb 12.8 oz (56.6 kg)  06/28/16 120 lb (54.4 kg)    General appearance: alert, cooperative and appears stated age Ears: normal TM's and external ear canals both ears Throat: lips, mucosa, and tongue normal; teeth and gums normal Neck: no adenopathy, no carotid bruit, supple, symmetrical, trachea midline and thyroid not enlarged, symmetric, no tenderness/mass/nodules Back: symmetric, no curvature. ROM normal. No CVA tenderness. Lungs: clear to auscultation bilaterally Heart: regular rate and rhythm, S1, S2 normal, no murmur, click, rub or gallop Abdomen: soft, non-tender; bowel sounds normal; no masses,  no organomegaly Pulses: 2+ and symmetric Skin: Skin color, texture, turgor normal. No rashes or lesions Lymph nodes: Cervical, supraclavicular, and axillary nodes normal.  Lab Results  Component Value Date   HGBA1C 4.6 12/27/2013   HGBA1C 5.7 (H) 08/31/2010    Lab Results  Component Value Date   CREATININE 0.71 07/06/2016   CREATININE 0.67 06/16/2016   CREATININE 0.60 06/01/2016    Lab Results  Component Value Date   WBC 7.9 10/02/2015   HGB 13.5 10/02/2015   HCT 40.4 10/02/2015   PLT 209 10/02/2015   GLUCOSE 76 07/06/2016   CHOL 175 08/31/2010   TRIG 67 08/31/2010   HDL 52 08/31/2010   LDLCALC 110 (H) 08/31/2010   ALT 15 05/24/2016   AST 23 05/24/2016   NA 132 (L) 07/06/2016   K 4.5 07/06/2016   CL 100 07/06/2016   CREATININE 0.71 07/06/2016   BUN 12 07/06/2016   CO2 29 07/06/2016   TSH 1.700 11/27/2013   INR 1.0 04/28/2014   HGBA1C 4.6 12/27/2013    No results found.  Assessment & Plan:   Problem List Items Addressed This Visit    Spinal stenosis of thoracolumbar region - Primary   Relevant Orders   Ambulatory referral to Physical Therapy   Ambulatory referral to Pain Clinic   Parkinsonian features    She has loss of balance,   Bilateral foot weakness,  Tremor and unsteady gait,  Referral to Neurology advised.       Relevant Orders   Ambulatory referral to Neurology   Hypertension    Well controlled on current regimen of amlodipine, lisinopril and metroprolol. Renal function stable, no changes today.  Lab Results  Component Value Date   CREATININE 0.71 07/06/2016   Lab Results  Component Value Date   NA 132 (L) 07/06/2016   K 4.5 07/06/2016   CL 100 07/06/2016   CO2 29 07/06/2016         Chronic bilateral low back pain    2nd neurosurgical opinion obtained from [facility] Dr March Rummage.  Surgery deferred.  Referral to PT and Pain Clinic underway.  Refill history confirmed via [place] Controlled Substance databas, accessed by me today..  She has been  increasing her vicodin use  To > 2 on several days and has run out early.  Advised that no early refills will be allowed,  But that she can continue tylenol and increase her gabapentin to three times daily until she can refill her Vicodin on June 20      Relevant Medications   HYDROcodone-acetaminophen (NORCO) 10-325 MG tablet   Other Relevant Orders   Ambulatory referral to Physical Therapy   Ambulatory referral to Pain Clinic      I am having Ms. Polhamus maintain her polyethylene glycol, multivitamin with minerals, sucralfate, pantoprazole, promethazine, lisinopril, amLODipine, metoprolol succinate, gabapentin, DULoxetine, and HYDROcodone-acetaminophen.  Meds ordered this encounter  Medications  . DISCONTD: HYDROcodone-acetaminophen (NORCO) 10-325 MG tablet    Sig: Take 1 tablet by mouth every 6 (six) hours as needed for severe pain.    Dispense:  120 tablet    Refill:  0    May refill on or after September 05, 2016.  Marland Kitchen DISCONTD: HYDROcodone-acetaminophen (NORCO) 10-325 MG tablet    Sig: Take 1 tablet by mouth every 6 (six) hours as needed for severe pain (Pain).    Dispense:  120 tablet    Refill:  0    May refill on or after October 07 2016  . DISCONTD:  HYDROcodone-acetaminophen (NORCO) 10-325 MG tablet    Sig: Take 1 tablet by mouth every 6 (six) hours as needed for severe pain (Pain).    Dispense:  120 tablet    Refill:  0    May refill on or after November 07 2016  . HYDROcodone-acetaminophen (NORCO) 10-325 MG tablet    Sig: Take 1 tablet by mouth every 6 (six) hours as needed for severe pain (Pain).    Dispense:  120 tablet    Refill:  0    May refill on or after December 08 2016   A total of 40 minutes was spent with patient more than half of which was spent in counseling patient on the above mentioned issues , reviewing and explaining recent labs and imaging studies done, and coordination of care.  Medications Discontinued During This Encounter  Medication Reason  . HYDROcodone-acetaminophen (NORCO) 10-325 MG tablet Reorder  . HYDROcodone-acetaminophen (NORCO) 10-325 MG tablet   . HYDROcodone-acetaminophen (NORCO) 10-325 MG tablet Reorder  . HYDROcodone-acetaminophen (NORCO) 10-325 MG tablet Reorder  . HYDROcodone-acetaminophen (NORCO) 10-325 MG tablet Reorder    Follow-up: No Follow-up on file.   [name], MD

## 2016-09-05 NOTE — Patient Instructions (Addendum)
  You can increase the gabapentin to three times daily and use tylenol  Up to 2000 mg  Daily (in divided doses)  Until you can refill your hydrocodone on June 20   Referrals to Neurology, Pain Clinic,  And Physical therapy will be done

## 2016-09-06 DIAGNOSIS — I10 Essential (primary) hypertension: Secondary | ICD-10-CM | POA: Insufficient documentation

## 2016-09-06 DIAGNOSIS — R259 Unspecified abnormal involuntary movements: Secondary | ICD-10-CM | POA: Insufficient documentation

## 2016-09-06 NOTE — Assessment & Plan Note (Signed)
2nd neurosurgical opinion obtained from [facility] Dr March Rummage.  Surgery deferred.  Referral to PT and Pain Clinic underway.  Refill history confirmed via [place] Controlled Substance databas, accessed by me today..  She has been increasing her vicodin use  To > 2 on several days and has run out early.  Advised that no early refills will be allowed,  But that she can continue tylenol and increase her gabapentin to three times daily until she can refill her Vicodin on June 20

## 2016-09-06 NOTE — Assessment & Plan Note (Addendum)
She has loss of balance,  Bilateral foot weakness,  Tremor and unsteady gait,  Referral to Neurology advised.

## 2016-09-06 NOTE — Assessment & Plan Note (Signed)
Well controlled on current regimen of amlodipine, lisinopril and metroprolol. Renal function stable, no changes today.  Lab Results  Component Value Date   CREATININE 0.71 07/06/2016   Lab Results  Component Value Date   NA 132 (L) 07/06/2016   K 4.5 07/06/2016   CL 100 07/06/2016   CO2 29 07/06/2016

## 2016-09-13 DIAGNOSIS — G8929 Other chronic pain: Secondary | ICD-10-CM | POA: Diagnosis not present

## 2016-09-13 DIAGNOSIS — M4805 Spinal stenosis, thoracolumbar region: Secondary | ICD-10-CM | POA: Diagnosis not present

## 2016-09-13 DIAGNOSIS — M5440 Lumbago with sciatica, unspecified side: Secondary | ICD-10-CM | POA: Diagnosis not present

## 2016-09-19 DIAGNOSIS — C679 Malignant neoplasm of bladder, unspecified: Secondary | ICD-10-CM | POA: Diagnosis not present

## 2016-09-19 DIAGNOSIS — C672 Malignant neoplasm of lateral wall of bladder: Secondary | ICD-10-CM | POA: Diagnosis not present

## 2016-09-20 ENCOUNTER — Telehealth: Payer: Self-pay | Admitting: Cardiology

## 2016-09-20 ENCOUNTER — Encounter: Payer: Medicare Other | Admitting: *Deleted

## 2016-09-20 NOTE — Telephone Encounter (Signed)
Spoke with pt and reminded pt of remote transmission that is due today. Pt verbalized understanding.   

## 2016-09-22 DIAGNOSIS — M5440 Lumbago with sciatica, unspecified side: Secondary | ICD-10-CM | POA: Diagnosis not present

## 2016-09-22 DIAGNOSIS — M4805 Spinal stenosis, thoracolumbar region: Secondary | ICD-10-CM | POA: Diagnosis not present

## 2016-09-22 DIAGNOSIS — G8929 Other chronic pain: Secondary | ICD-10-CM | POA: Diagnosis not present

## 2016-09-29 DIAGNOSIS — M5440 Lumbago with sciatica, unspecified side: Secondary | ICD-10-CM | POA: Diagnosis not present

## 2016-09-29 DIAGNOSIS — G8929 Other chronic pain: Secondary | ICD-10-CM | POA: Diagnosis not present

## 2016-09-29 DIAGNOSIS — M4805 Spinal stenosis, thoracolumbar region: Secondary | ICD-10-CM | POA: Diagnosis not present

## 2016-10-04 ENCOUNTER — Encounter: Payer: Self-pay | Admitting: Internal Medicine

## 2016-10-04 ENCOUNTER — Ambulatory Visit (INDEPENDENT_AMBULATORY_CARE_PROVIDER_SITE_OTHER): Payer: Medicare Other | Admitting: Internal Medicine

## 2016-10-04 VITALS — BP 130/74 | HR 66 | Temp 97.6°F | Resp 15 | Ht 68.0 in | Wt 131.4 lb

## 2016-10-04 DIAGNOSIS — E871 Hypo-osmolality and hyponatremia: Secondary | ICD-10-CM | POA: Diagnosis not present

## 2016-10-04 DIAGNOSIS — G8929 Other chronic pain: Secondary | ICD-10-CM

## 2016-10-04 DIAGNOSIS — I420 Dilated cardiomyopathy: Secondary | ICD-10-CM | POA: Diagnosis not present

## 2016-10-04 DIAGNOSIS — I10 Essential (primary) hypertension: Secondary | ICD-10-CM | POA: Diagnosis not present

## 2016-10-04 DIAGNOSIS — M4805 Spinal stenosis, thoracolumbar region: Secondary | ICD-10-CM | POA: Diagnosis not present

## 2016-10-04 DIAGNOSIS — M5440 Lumbago with sciatica, unspecified side: Secondary | ICD-10-CM

## 2016-10-04 LAB — BASIC METABOLIC PANEL
BUN: 16 mg/dL (ref 6–23)
CO2: 28 meq/L (ref 19–32)
Calcium: 10.4 mg/dL (ref 8.4–10.5)
Chloride: 97 mEq/L (ref 96–112)
Creatinine, Ser: 0.67 mg/dL (ref 0.40–1.20)
GFR: 90.58 mL/min (ref >60.00)
GLUCOSE: 92 mg/dL (ref 70–99)
POTASSIUM: 5.1 meq/L (ref 3.5–5.1)
SODIUM: 130 meq/L — AB (ref 135–145)

## 2016-10-04 MED ORDER — OXYCODONE-ACETAMINOPHEN 5-325 MG PO TABS: 1.0000 | ORAL_TABLET | ORAL | 0 refills | Status: DC | PRN

## 2016-10-04 NOTE — Patient Instructions (Addendum)
Dr Glade Nurse was the neurosurgeon that Dr March Rummage was recommending for your complicated back surgery  Let me know if you want to proceed with the referral   I AM CHANGING YOUR PAIN MEDICATION FROM HYDROCODONE TO OXYCODONE.  This is a MUCH STRONGER MEDICATION .  Suspend the gabapentin when you first start the medication.  DO NOT TAKE MORE THAN 6 PER 24 HOUR PERIOD

## 2016-10-04 NOTE — Progress Notes (Signed)
Subjective:  Patient ID: [name], female    DOB: Aug 12, 1940  Age: 76 y.o. MRN: [id number]  CC: The primary encounter diagnosis was Hyponatremia. Diagnoses of Chronic bilateral low back pain with sciatica, sciatica laterality unspecified, Congestive dilated cardiomyopathy ([place]), Essential hypertension, and Spinal stenosis of thoracolumbar region were also pertinent to this visit.  HPI [name] presents for follow up on chronic pain secondary to spinal stenosis complicated by scoliosis,  Managed with 4 vicodin daily per narcotics contract signed at previous visit.  Pain clinic referral  ([facility]) And neurology referral for Parkinson features were made at last visit, but she has not heard from the Pain Clinic yet.  Marland Kitchen  Has an appt on aug 21 with neurology   She has been to PT 3 times,  Undecided about whether it is helping. The exercises for  Leg strengthening are tolerated and helping but the back exercises have been aggravating her  back pain so she has been told to stop them.  Her gait instability is still an issue for her , but she has not fallen.  Using her rolling walker in the house.      Has been in constant pain for 3 years. Has decided she wants to pursue the referral to the neurosurgen [place]  Recommended  by Dr March Rummage.   Her pain is not under control on 4 vicodin  And cymbalta.  She is counting the hours to her next dose,  And is generally miserable. She is requesting a stronger medication .  Discussed changing to percocet   Hyponatremia:  She continues to have low sodium and is still making an effort to drink gatorade on a daily basis .     Outpatient Medications Prior to Visit  Medication Sig Dispense Refill  . amLODipine (NORVASC) 5 MG tablet Take 1 tablet (5 mg total) by mouth 2 (two) times daily. 60 tablet 3  . DULoxetine (CYMBALTA) 30 MG capsule TAKE 1 CAPSULE(30 MG) BY MOUTH DAILY 30 capsule 0  . gabapentin (NEURONTIN) 300 MG capsule TAKE 1 CAPSULE BY  MOUTH TWICE DAILY EVERY NIGHT AT BEDTIME AS DIRECTED 60 capsule 0  . HYDROcodone-acetaminophen (NORCO) 10-325 MG tablet Take 1 tablet by mouth every 6 (six) hours as needed for severe pain (Pain). 120 tablet 0  . lisinopril (PRINIVIL,ZESTRIL) 40 MG tablet Take 1 tablet (40 mg total) by mouth daily. 30 tablet 5  . metoprolol succinate (TOPROL-XL) 25 MG 24 hr tablet TAKE 1 TABLET BY MOUTH EVERY DAY 30 tablet 3  . Multiple Vitamin (MULTIVITAMIN WITH MINERALS) TABS tablet Take 1 tablet by mouth daily.    . pantoprazole (PROTONIX) 40 MG tablet Take 1 tablet (40 mg total) by mouth 2 (two) times daily. 60 tablet 2  . polyethylene glycol (MIRALAX / GLYCOLAX) packet Take 17 g by mouth daily as needed for mild constipation.     . promethazine (PHENERGAN) 12.5 MG tablet TAKE 1 TABLET BY MOUTH EVERY 8 HOURS AS NEEDED FOR NAUSEA OR VOMITING 60 tablet 0  . sucralfate (CARAFATE) 1 g tablet Take 1 tablet (1 g total) by mouth 4 (four) times daily -  with meals and at bedtime. 120 tablet 2   No facility-administered medications prior to visit.     Review of Systems;  Patient denies headache, fevers, malaise, unintentional weight loss, skin rash, eye pain, sinus congestion and sinus pain, sore throat, dysphagia,  hemoptysis , cough, dyspnea, wheezing, chest pain, palpitations, orthopnea, edema, abdominal pain, nausea,  melena, diarrhea, constipation, flank pain, dysuria, hematuria, urinary  Frequency, nocturia, numbness, tingling, seizures,  Focal weakness, Loss of consciousness,  Tremor, insomnia, depression, anxiety, and suicidal ideation.      Objective:  BP 130/74 (BP Location: Left Arm, Patient Position: Sitting, Cuff Size: Normal)   Pulse 66   Temp 97.6 F (36.4 C) (Oral)   Resp 15   Ht 5\' 8"  (1.727 m)   Wt 131 lb 6.4 oz (59.6 kg)   SpO2 95%   BMI 19.98 kg/m   BP Readings from Last 3 Encounters:  10/04/16 130/74  09/05/16 116/80  07/06/16 112/70    Wt Readings from Last 3 Encounters:    10/04/16 131 lb 6.4 oz (59.6 kg)  09/05/16 129 lb (58.5 kg)  07/06/16 124 lb 12.8 oz (56.6 kg)    General appearance: alert, cooperative and appears stated age Ears: normal TM's and external ear canals both ears Throat: lips, mucosa, and tongue normal; teeth and gums normal Neck: no adenopathy, no carotid bruit, supple, symmetrical, trachea midline and thyroid not enlarged, symmetric, no tenderness/mass/nodules Back: symmetric, no curvature. ROM normal. No CVA tenderness. Lungs: clear to auscultation bilaterally Heart: regular rate and rhythm, S1, S2 normal, no murmur, click, rub or gallop Abdomen: soft, non-tender; bowel sounds normal; no masses,  no organomegaly Pulses: 2+ and symmetric Skin: Skin color, texture, turgor normal. No rashes or lesions Lymph nodes: Cervical, supraclavicular, and axillary nodes normal.  Lab Results  Component Value Date   HGBA1C 4.6 12/27/2013   HGBA1C 5.7 (H) 08/31/2010    Lab Results  Component Value Date   CREATININE 0.67 10/04/2016   CREATININE 0.71 07/06/2016   CREATININE 0.67 06/16/2016    Lab Results  Component Value Date   WBC 7.9 10/02/2015   HGB 13.5 10/02/2015   HCT 40.4 10/02/2015   PLT 209 10/02/2015   GLUCOSE 92 10/04/2016   CHOL 175 08/31/2010   TRIG 67 08/31/2010   HDL 52 08/31/2010   LDLCALC 110 (H) 08/31/2010   ALT 15 05/24/2016   AST 23 05/24/2016   NA 130 (L) 10/04/2016   K 5.1 10/04/2016   CL 97 10/04/2016   CREATININE 0.67 10/04/2016   BUN 16 10/04/2016   CO2 28 10/04/2016   TSH 1.700 11/27/2013   INR 1.0 04/28/2014   HGBA1C 4.6 12/27/2013    No results found.  Assessment & Plan:   Problem List Items Addressed This Visit    Chronic bilateral low back pain    2nd neurosurgical opinion obtained from [facility] Dr March Rummage.  Surgery now being considered..  Referral to PT and Pain Clinic underway.  Refill history confirmed via [place] Controlled Substance databas, accessed by me today..  She has been using 4 vicodin   Daily with no significant relief of pain . Trial of oxycodone 5mg /APAP every 4 hours.  One month trial       Relevant Medications   oxyCODONE-acetaminophen (ROXICET) 5-325 MG tablet   Congestive dilated cardiomyopathy ([facility])    EF had normalized to 60% Oct 2017 ECHO following resynchronization with pacer.  She has no signs of volume overload.  Avoiding diuretic given persistent hyponatremia       Hypertension    Well controlled on current regimen. Renal function stable, no changes today. Borderline hyperkalemia noted,  May be due to concurrent  of lisinopril and opioids  Lab Results  Component Value Date   CREATININE 0.67 10/04/2016   Lab Results  Component Value Date   NA 130 (L)  10/04/2016   K 5.1 10/04/2016   CL 97 10/04/2016   CO2 28 10/04/2016         Hyponatremia - Primary    Persistent,  Despite use of gatorade,  Checking Urine osmolality and urine sodium.    Lab Results  Component Value Date   NA 130 (L) 10/04/2016   K 5.1 10/04/2016   CL 97 10/04/2016   CO2 28 10/04/2016         Relevant Orders   Basic metabolic panel (Completed)   Sodium, urine, random   Osmolality, urine   Spinal stenosis of thoracolumbar region    Severe, by repeat lumbar CT starting at T12, with persistent pain managed with daily opioids.  Neurosurgical referral in process          I am having Ms. Schleifer start on oxyCODONE-acetaminophen. I am also having her maintain her polyethylene glycol, multivitamin with minerals, pantoprazole, promethazine, lisinopril, amLODipine, metoprolol succinate, gabapentin, DULoxetine, and HYDROcodone-acetaminophen.  Meds ordered this encounter  Medications  . oxyCODONE-acetaminophen (ROXICET) 5-325 MG tablet    Sig: Take 1 tablet by mouth every 4 (four) hours as needed for severe pain.    Dispense:  180 tablet    Refill:  0    There are no discontinued medications.  Follow-up: Return in about 3 months (around 01/04/2017).   [name],  MD

## 2016-10-05 ENCOUNTER — Other Ambulatory Visit: Payer: Self-pay | Admitting: Internal Medicine

## 2016-10-06 ENCOUNTER — Other Ambulatory Visit: Payer: Self-pay | Admitting: Internal Medicine

## 2016-10-06 DIAGNOSIS — E871 Hypo-osmolality and hyponatremia: Secondary | ICD-10-CM

## 2016-10-06 NOTE — Assessment & Plan Note (Signed)
EF had normalized to 60% Oct 2017 ECHO following resynchronization with pacer.  She has no signs of volume overload.  Avoiding diuretic given persistent hyponatremia

## 2016-10-06 NOTE — Assessment & Plan Note (Signed)
2nd neurosurgical opinion obtained from [facility] Dr March Rummage.  Surgery now being considered..  Referral to PT and Pain Clinic underway.  Refill history confirmed via [place] Controlled Substance databas, accessed by me today..  She has been using 4 vicodin  Daily with no significant relief of pain . Trial of oxycodone 5mg /APAP every 4 hours.  One month trial

## 2016-10-06 NOTE — Assessment & Plan Note (Signed)
Persistent,  Despite use of gatorade,  Checking Urine osmolality and urine sodium.    Lab Results  Component Value Date   NA 130 (L) 10/04/2016   K 5.1 10/04/2016   CL 97 10/04/2016   CO2 28 10/04/2016

## 2016-10-06 NOTE — Assessment & Plan Note (Signed)
Well controlled on current regimen. Renal function stable, no changes today. Borderline hyperkalemia noted,  May be due to concurrent  of lisinopril and opioids  Lab Results  Component Value Date   CREATININE 0.67 10/04/2016   Lab Results  Component Value Date   NA 130 (L) 10/04/2016   K 5.1 10/04/2016   CL 97 10/04/2016   CO2 28 10/04/2016

## 2016-10-06 NOTE — Assessment & Plan Note (Signed)
Severe, by repeat lumbar CT starting at T12, with persistent pain managed with daily opioids.  Neurosurgical referral in process

## 2016-10-10 ENCOUNTER — Telehealth: Payer: Self-pay | Admitting: Internal Medicine

## 2016-10-10 ENCOUNTER — Other Ambulatory Visit (INDEPENDENT_AMBULATORY_CARE_PROVIDER_SITE_OTHER): Payer: Medicare Other

## 2016-10-10 DIAGNOSIS — E871 Hypo-osmolality and hyponatremia: Secondary | ICD-10-CM

## 2016-10-10 DIAGNOSIS — M4802 Spinal stenosis, cervical region: Secondary | ICD-10-CM

## 2016-10-10 DIAGNOSIS — M4805 Spinal stenosis, thoracolumbar region: Secondary | ICD-10-CM

## 2016-10-10 LAB — TSH: TSH: 0.67 u[IU]/mL (ref 0.35–4.50)

## 2016-10-10 NOTE — Telephone Encounter (Signed)
Please advise 

## 2016-10-10 NOTE — Telephone Encounter (Signed)
Pt stated that she wanted a referral to Spine center in [facility] to Dr. Marzetta Merino. Please advise

## 2016-10-11 ENCOUNTER — Other Ambulatory Visit: Payer: Self-pay | Admitting: Internal Medicine

## 2016-10-11 LAB — SODIUM, URINE, RANDOM: Sodium, Ur: 28 mmol/L (ref 28–272)

## 2016-10-11 LAB — OSMOLALITY: Osmolality: 271 mOsm/kg — ABNORMAL LOW (ref 278–305)

## 2016-10-11 LAB — OSMOLALITY, URINE: Osmolality, Ur: 151 mOsm/kg (ref 50–1200)

## 2016-10-11 NOTE — Telephone Encounter (Signed)
  Your referral is in process as discussed. Our referral coordinator will call you when the appointment has been made.  If you do not hear from [facility] in our office in a week  Please call us back

## 2016-10-11 NOTE — Telephone Encounter (Signed)
Notified pt and she gave a verbal understanding.

## 2016-10-11 NOTE — Telephone Encounter (Signed)
LMTCB

## 2016-10-24 ENCOUNTER — Telehealth: Payer: Self-pay | Admitting: *Deleted

## 2016-10-24 NOTE — Telephone Encounter (Signed)
Would it be ok to schedule pt in one of the 11:30 or 4:30 slots that is all that is available between now and 11/05/2016? Pt was last seen on 10/04/2016 for a medication refill.

## 2016-10-24 NOTE — Telephone Encounter (Signed)
YES, but in the future those spots are for urgent medical issues.  Patient's who wait until the last minute TO [place] will NOT BE GIVEN URGENT SLOTS

## 2016-10-24 NOTE — Telephone Encounter (Signed)
Patient will need a follow up appt to have her pain medication refilled before 11/05/16 . Please give a time and date   Pt contact [phone]

## 2016-10-25 NOTE — Telephone Encounter (Signed)
Can you schedule pt for 11/02/2016 at 4:30pm per Dr. Derrel Nip. Pt is aware of appt date and time. Also explained to the pt that she needs to make sure she is scheduling her appts before she leaves the office and not waiting until she is about to run out of medicine before she schedules an appt.

## 2016-10-25 NOTE — Telephone Encounter (Signed)
Patient scheduled.

## 2016-11-02 ENCOUNTER — Encounter: Payer: Self-pay | Admitting: Internal Medicine

## 2016-11-02 ENCOUNTER — Ambulatory Visit (INDEPENDENT_AMBULATORY_CARE_PROVIDER_SITE_OTHER): Payer: Medicare Other | Admitting: Internal Medicine

## 2016-11-02 VITALS — BP 124/70 | HR 61 | Temp 98.0°F | Resp 18 | Wt 133.4 lb

## 2016-11-02 DIAGNOSIS — M5440 Lumbago with sciatica, unspecified side: Secondary | ICD-10-CM

## 2016-11-02 DIAGNOSIS — M4805 Spinal stenosis, thoracolumbar region: Secondary | ICD-10-CM

## 2016-11-02 DIAGNOSIS — E871 Hypo-osmolality and hyponatremia: Secondary | ICD-10-CM

## 2016-11-02 DIAGNOSIS — G8929 Other chronic pain: Secondary | ICD-10-CM | POA: Diagnosis not present

## 2016-11-02 MED ORDER — CYCLOBENZAPRINE HCL 5 MG PO TABS: 5.0000 mg | ORAL_TABLET | Freq: Three times a day (TID) | ORAL | 1 refills | Status: DC | PRN

## 2016-11-02 MED ORDER — HYDROCODONE-ACETAMINOPHEN 10-325 MG PO TABS: 1.0000 | ORAL_TABLET | ORAL | 0 refills | Status: DC | PRN

## 2016-11-02 NOTE — Progress Notes (Signed)
Subjective:  Patient ID: [name], female    DOB: 09-05-1940  Age: 76 y.o. MRN: [id number]  CC: The primary encounter diagnosis was Hyponatremia. Diagnoses of Spinal stenosis of thoracolumbar region and Chronic bilateral low back pain with sciatica, sciatica laterality unspecified were also pertinent to this visit.  HPI [name] presents for  Medication follow up.  Patient was last seen on e month aga and requested a change in medication,  She had been taking Vicodin 10/325 every 6 hours for pain and felt that her pain was not well controlled on this regimen.  She requested a change to oxycodone and has ben using it for the past month with decreased  Tolerance of medication nd decreased control of medications   She has not had any ER visits  And has not requested any early refills.  Her Refill history was confirmed via [place] Controlled Substance database by me today during her visit and there have been no prescriptions of controlled substances filled from any providers other than me. .     Has a phone visit on August 29th with Dr Cristy Hilts    Outpatient Medications Prior to Visit  Medication Sig Dispense Refill  . DULoxetine (CYMBALTA) 30 MG capsule TAKE 1 CAPSULE(30 MG) BY MOUTH DAILY 30 capsule 0  . gabapentin (NEURONTIN) 300 MG capsule TAKE 1 CAPSULE BY MOUTH TWICE DAILY EVERY NIGHT AT BEDTIME AS DIRECTED 60 capsule 0  . lisinopril (PRINIVIL,ZESTRIL) 40 MG tablet Take 1 tablet (40 mg total) by mouth daily. 30 tablet 5  . metoprolol succinate (TOPROL-XL) 25 MG 24 hr tablet TAKE 1 TABLET BY MOUTH EVERY DAY 30 tablet 3  . Multiple Vitamin (MULTIVITAMIN WITH MINERALS) TABS tablet Take 1 tablet by mouth daily.    . pantoprazole (PROTONIX) 40 MG tablet TAKE 1 TABLET(40 MG) BY MOUTH TWICE DAILY 60 tablet 0  . polyethylene glycol (MIRALAX / GLYCOLAX) packet Take 17 g by mouth daily as needed for mild constipation.     . promethazine (PHENERGAN) 12.5 MG tablet TAKE 1 TABLET BY MOUTH EVERY 8  HOURS AS NEEDED FOR NAUSEA OR VOMITING 60 tablet 0  . sucralfate (CARAFATE) 1 g tablet TAKE 1 TABLET BY MOUTH FOUR TIMES DAILY EVERY NIGHT AT BEDTIME WITH MEALS 120 tablet 0  . amLODipine (NORVASC) 5 MG tablet Take 1 tablet (5 mg total) by mouth 2 (two) times daily. 60 tablet 3  . oxyCODONE-acetaminophen (ROXICET) 5-325 MG tablet Take 1 tablet by mouth every 4 (four) hours as needed for severe pain. 180 tablet 0  . HYDROcodone-acetaminophen (NORCO) 10-325 MG tablet Take 1 tablet by mouth every 6 (six) hours as needed for severe pain (Pain). (Patient not taking: Reported on 11/02/2016) 120 tablet 0   No facility-administered medications prior to visit.     Review of Systems;  Patient denies headache, fevers, malaise, unintentional weight loss, skin rash, eye pain, sinus congestion and sinus pain, sore throat, dysphagia,  hemoptysis , cough, dyspnea, wheezing, chest pain, palpitations, orthopnea, edema, abdominal pain, nausea, melena, diarrhea, constipation, flank pain, dysuria, hematuria, urinary  Frequency, nocturia, numbness, tingling, seizures,  Focal weakness, Loss of consciousness,  Tremor, insomnia, depression, anxiety, and suicidal ideation.      Objective:  BP 124/70   Pulse 61   Temp 98 F (36.7 C) (Oral)   Resp 18   Wt 133 lb 6.4 oz (60.5 kg)   SpO2 96%   BMI 20.28 kg/m   BP Readings from Last 3 Encounters:  11/02/16  124/70  10/04/16 130/74  09/05/16 116/80    Wt Readings from Last 3 Encounters:  11/02/16 133 lb 6.4 oz (60.5 kg)  10/04/16 131 lb 6.4 oz (59.6 kg)  09/05/16 129 lb (58.5 kg)    General appearance: alert, cooperative and appears stated age Ears: normal TM's and external ear canals both ears Throat: lips, mucosa, and tongue normal; teeth and gums normal Neck: no adenopathy, no carotid bruit, supple, symmetrical, trachea midline and thyroid not enlarged, symmetric, no tenderness/mass/nodules Back: symmetric, no curvature. ROM normal. No CVA  tenderness. Lungs: clear to auscultation bilaterally Heart: regular rate and rhythm, S1, S2 normal, no murmur, click, rub or gallop Abdomen: soft, non-tender; bowel sounds normal; no masses,  no organomegaly Pulses: 2+ and symmetric Skin: Skin color, texture, turgor normal. No rashes or lesions Lymph nodes: Cervical, supraclavicular, and axillary nodes normal.  Lab Results  Component Value Date   HGBA1C 4.6 12/27/2013   HGBA1C 5.7 (H) 08/31/2010    Lab Results  Component Value Date   CREATININE 0.69 11/02/2016   CREATININE 0.67 10/04/2016   CREATININE 0.71 07/06/2016    Lab Results  Component Value Date   WBC 7.9 10/02/2015   HGB 13.5 10/02/2015   HCT 40.4 10/02/2015   PLT 209 10/02/2015   GLUCOSE 90 11/02/2016   CHOL 175 08/31/2010   TRIG 67 08/31/2010   HDL 52 08/31/2010   LDLCALC 110 (H) 08/31/2010   ALT 15 05/24/2016   AST 23 05/24/2016   NA 133 (L) 11/02/2016   K 4.2 11/02/2016   CL 98 11/02/2016   CREATININE 0.69 11/02/2016   BUN 10 11/02/2016   CO2 29 11/02/2016   TSH 0.67 10/10/2016   INR 1.0 04/28/2014   HGBA1C 4.6 12/27/2013    No results found.  Assessment & Plan:   Problem List Items Addressed This Visit    Chronic bilateral low back pain    Advised to resume cymbalta.  vicodin dose /frequency increased       Relevant Medications   HYDROcodone-acetaminophen (NORCO) 10-325 MG tablet   cyclobenzaprine (FLEXERIL) 5 MG tablet   Hyponatremia - Primary   Relevant Orders   Basic metabolic panel (Completed)   Spinal stenosis of thoracolumbar region    She is scheduled to evaluation by [facility] neurosurgeon .  Pain management changed from oxycodone, which she  did not tolerate,  To hydrocodone 10/325 6 times daily         A total of 25 minutes of face to face time was spent with patient more than half of which was spent in counselling about the above mentioned conditions  and coordination of care   I have discontinued Ms. Herbert  oxyCODONE-acetaminophen. I have also changed her HYDROcodone-acetaminophen. Additionally, I am having her start on cyclobenzaprine. Lastly, I am having her maintain her polyethylene glycol, multivitamin with minerals, promethazine, lisinopril, metoprolol succinate, DULoxetine, sucralfate, pantoprazole, gabapentin, amLODipine, and spironolactone.  Meds ordered this encounter  Medications  . amLODipine (NORVASC) 10 MG tablet    Refill:  3  . spironolactone (ALDACTONE) 25 MG tablet    Sig: TK 1 T PO D    Refill:  1  . HYDROcodone-acetaminophen (NORCO) 10-325 MG tablet    Sig: Take 1 tablet by mouth every 4 (four) hours as needed for severe pain (Pain).    Dispense:  180 tablet    Refill:  0    May refill on or after November 05 2016  . cyclobenzaprine (FLEXERIL) 5 MG tablet  Sig: Take 1 tablet (5 mg total) by mouth 3 (three) times daily as needed for muscle spasms.    Dispense:  90 tablet    Refill:  1    Medications Discontinued During This Encounter  Medication Reason  . amLODipine (NORVASC) 5 MG tablet Dose change  . HYDROcodone-acetaminophen (NORCO) 10-325 MG tablet Reorder  . oxyCODONE-acetaminophen (ROXICET) 5-325 MG tablet     Follow-up: Return in about 4 weeks (around 11/30/2016).   [name], MD

## 2016-11-02 NOTE — Patient Instructions (Addendum)
  I have increased  the hydrocodone frequency to every 4 hours to manage your back pain   Try adding  the muscle relaxer first , in the evening before resuming the duloxetine  The duloxetine is meant to be taken EVERY DAY TO modify your body's response to pain.  It is an antidepressant   You will not be able to bring a urine specimen from home.  YOu will need to call and ask for [name]  And she will see you and collect a urine specimen

## 2016-11-03 ENCOUNTER — Telehealth: Payer: Self-pay | Admitting: Internal Medicine

## 2016-11-03 LAB — BASIC METABOLIC PANEL
BUN: 10 mg/dL (ref 6–23)
CALCIUM: 10.1 mg/dL (ref 8.4–10.5)
CHLORIDE: 98 meq/L (ref 96–112)
CO2: 29 meq/L (ref 19–32)
CREATININE: 0.69 mg/dL (ref 0.40–1.20)
GFR: 87.54 mL/min (ref >60.00)
Glucose, Bld: 90 mg/dL (ref 70–99)
Potassium: 4.2 mEq/L (ref 3.5–5.1)
Sodium: 133 mEq/L — ABNORMAL LOW (ref 135–145)

## 2016-11-03 NOTE — Telephone Encounter (Signed)
Pt needs a 4wk follow up.. Please advise where to schedule 4wks is around 11/30/16

## 2016-11-04 NOTE — Telephone Encounter (Signed)
Patient scheduled.

## 2016-11-05 NOTE — Assessment & Plan Note (Signed)
Advised to resume cymbalta.  vicodin dose /frequency increased

## 2016-11-05 NOTE — Assessment & Plan Note (Signed)
She is scheduled to evaluation by [facility] neurosurgeon .  Pain management changed from oxycodone, which she  did not tolerate,  To hydrocodone 10/325 6 times daily

## 2016-11-08 ENCOUNTER — Ambulatory Visit: Payer: Self-pay | Admitting: Neurology

## 2016-11-15 ENCOUNTER — Other Ambulatory Visit: Payer: Self-pay | Admitting: Internal Medicine

## 2016-11-16 ENCOUNTER — Other Ambulatory Visit: Payer: Self-pay | Admitting: *Deleted

## 2016-11-16 DIAGNOSIS — I420 Dilated cardiomyopathy: Secondary | ICD-10-CM

## 2016-11-16 MED ORDER — METOPROLOL SUCCINATE ER 25 MG PO TB24: 25.0000 mg | ORAL_TABLET | Freq: Every day | ORAL | 3 refills | Status: DC

## 2016-11-22 DIAGNOSIS — Z5111 Encounter for antineoplastic chemotherapy: Secondary | ICD-10-CM | POA: Diagnosis not present

## 2016-11-22 DIAGNOSIS — C678 Malignant neoplasm of overlapping sites of bladder: Secondary | ICD-10-CM | POA: Diagnosis not present

## 2016-11-29 DIAGNOSIS — Z5111 Encounter for antineoplastic chemotherapy: Secondary | ICD-10-CM | POA: Diagnosis not present

## 2016-11-29 DIAGNOSIS — R8271 Bacteriuria: Secondary | ICD-10-CM | POA: Diagnosis not present

## 2016-11-29 DIAGNOSIS — C678 Malignant neoplasm of overlapping sites of bladder: Secondary | ICD-10-CM | POA: Diagnosis not present

## 2016-11-30 ENCOUNTER — Ambulatory Visit (INDEPENDENT_AMBULATORY_CARE_PROVIDER_SITE_OTHER): Payer: Medicare Other | Admitting: Internal Medicine

## 2016-11-30 ENCOUNTER — Encounter: Payer: Self-pay | Admitting: Internal Medicine

## 2016-11-30 VITALS — BP 102/68 | HR 94 | Temp 97.5°F | Resp 15 | Ht 68.0 in | Wt 134.8 lb

## 2016-11-30 DIAGNOSIS — M5440 Lumbago with sciatica, unspecified side: Secondary | ICD-10-CM

## 2016-11-30 DIAGNOSIS — G8929 Other chronic pain: Secondary | ICD-10-CM

## 2016-11-30 DIAGNOSIS — M4805 Spinal stenosis, thoracolumbar region: Secondary | ICD-10-CM

## 2016-11-30 MED ORDER — HYDROCODONE-ACETAMINOPHEN 10-325 MG PO TABS: 1.0000 | ORAL_TABLET | ORAL | 0 refills | Status: DC | PRN

## 2016-11-30 NOTE — Patient Instructions (Signed)
I have refilled your hydrocodone for October, Novvember  and December.  I'm sorry that I cannot prescribe anything stronger;  since you did not feel the oxycodone was as effective.  Anything stronger cannot be tried while you are living alone  because of the risk of overdose   I have made the referral to the [facility] Pain clinic for ehlp in managing your pain

## 2016-11-30 NOTE — Progress Notes (Signed)
Subjective:  Patient ID: [name], female    DOB: 1940/05/30  Age: 76 y.o. MRN: [id number]  CC: The primary encounter diagnosis was Spinal stenosis of thoracolumbar region. A diagnosis of Chronic bilateral low back pain with sciatica, sciatica laterality unspecified was also pertinent to this visit.  HPI [name] presents for follow up on chronic back pain managed with vicodin prescribed by me pending neurosurgical intervention. She is current prescribed and taking  6 tablets daily , but continues to report pain not controlled with this dose.  Last  Prescribed august 15   She is disappointed .  She Had a telemedicine neurosurgical evaluation by Dr. Marlyne Beards and was told that she was not a surgical candidate presently due to comorbidities,  Including COPD, dilated cardiomyopathy, and bladder cancer, as well as her ongoing ongoing tobacco abuse and narcotic use.  she was referred to the Pain Clinic , which she initially declined, but has now reconsidered. She has decided to request a Pain clinic referral to the clinic in [place].  Her balance is getting worse and she is in the process of relocating to [facility] in a couple of months to live with [name] her daughter.  She has not fallen but is very careful and is using a rolling walker.   Refill history confirmed via [place] Controlled Substance databas, accessed by me today..  Outpatient Medications Prior to Visit  Medication Sig Dispense Refill  . amLODipine (NORVASC) 10 MG tablet   3  . cyclobenzaprine (FLEXERIL) 5 MG tablet Take 1 tablet (5 mg total) by mouth 3 (three) times daily as needed for muscle spasms. 90 tablet 1  . DULoxetine (CYMBALTA) 30 MG capsule TAKE 1 CAPSULE(30 MG) BY MOUTH DAILY 30 capsule 0  . gabapentin (NEURONTIN) 300 MG capsule TAKE 1 CAPSULE BY MOUTH TWICE DAILY EVERY NIGHT AT BEDTIME AS DIRECTED 60 capsule 0  . lisinopril (PRINIVIL,ZESTRIL) 40 MG tablet Take 1 tablet (40 mg total) by mouth daily. 30 tablet 5    . metoprolol succinate (TOPROL-XL) 25 MG 24 hr tablet Take 1 tablet (25 mg total) by mouth daily. 30 tablet 3  . Multiple Vitamin (MULTIVITAMIN WITH MINERALS) TABS tablet Take 1 tablet by mouth daily.    . pantoprazole (PROTONIX) 40 MG tablet TAKE 1 TABLET(40 MG) BY MOUTH TWICE DAILY 60 tablet 0  . polyethylene glycol (MIRALAX / GLYCOLAX) packet Take 17 g by mouth daily as needed for mild constipation.     . promethazine (PHENERGAN) 12.5 MG tablet TAKE 1 TABLET BY MOUTH EVERY 8 HOURS AS NEEDED FOR NAUSEA OR VOMITING 60 tablet 0  . spironolactone (ALDACTONE) 25 MG tablet TK 1 T PO D  1  . sucralfate (CARAFATE) 1 g tablet TAKE 1 TABLET BY MOUTH FOUR TIMES DAILY EVERY NIGHT AT BEDTIME WITH MEALS 120 tablet 0  . HYDROcodone-acetaminophen (NORCO) 10-325 MG tablet Take 1 tablet by mouth every 4 (four) hours as needed for severe pain (Pain). 180 tablet 0   No facility-administered medications prior to visit.     Review of Systems;  Patient denies headache, fevers, malaise, unintentional weight loss, skin rash, eye pain, sinus congestion and sinus pain, sore throat, dysphagia,  hemoptysis , cough, dyspnea, wheezing, chest pain, palpitations, orthopnea, edema, abdominal pain, nausea, melena, diarrhea, constipation, flank pain, dysuria, hematuria, urinary  Frequency, nocturia, numbness, tingling, seizures,  Focal weakness, Loss of consciousness,  Tremor, insomnia, depression, anxiety, and suicidal ideation.      Objective:  BP 102/68 (BP  Location: Left Arm, Patient Position: Sitting, Cuff Size: Normal)   Pulse 94   Temp (!) 97.5 F (36.4 C) (Oral)   Resp 15   Ht 5\' 8"  (1.727 m)   Wt 134 lb 12.8 oz (61.1 kg)   SpO2 (!) 80%   BMI 20.50 kg/m   BP Readings from Last 3 Encounters:  11/30/16 102/68  11/02/16 124/70  10/04/16 130/74    Wt Readings from Last 3 Encounters:  11/30/16 134 lb 12.8 oz (61.1 kg)  11/02/16 133 lb 6.4 oz (60.5 kg)  10/04/16 131 lb 6.4 oz (59.6 kg)    General  appearance: alert, cooperative and appears stated age Ears: normal TM's and external ear canals both ears Throat: lips, mucosa, and tongue normal; teeth and gums normal Neck: no adenopathy, no carotid bruit, supple, symmetrical, trachea midline and thyroid not enlarged, symmetric, no tenderness/mass/nodules Back: symmetric, mild kyphosis . ROM restricted,  No CVA tenderness. Lungs: clear to auscultation bilaterally Heart: regular rate and rhythm, S1, S2 normal, no murmur, click, rub or gallop Abdomen: soft, non-tender; bowel sounds normal; no masses,  no organomegaly Pulses: 2+ and symmetric Skin: Skin color, texture, turgor normal. No rashes or lesions Lymph nodes: Cervical, supraclavicular, and axillary nodes normal.  Lab Results  Component Value Date   HGBA1C 4.6 12/27/2013   HGBA1C 5.7 (H) 08/31/2010    Lab Results  Component Value Date   CREATININE 0.69 11/02/2016   CREATININE 0.67 10/04/2016   CREATININE 0.71 07/06/2016    Lab Results  Component Value Date   WBC 7.9 10/02/2015   HGB 13.5 10/02/2015   HCT 40.4 10/02/2015   PLT 209 10/02/2015   GLUCOSE 90 11/02/2016   CHOL 175 08/31/2010   TRIG 67 08/31/2010   HDL 52 08/31/2010   LDLCALC 110 (H) 08/31/2010   ALT 15 05/24/2016   AST 23 05/24/2016   NA 133 (L) 11/02/2016   K 4.2 11/02/2016   CL 98 11/02/2016   CREATININE 0.69 11/02/2016   BUN 10 11/02/2016   CO2 29 11/02/2016   TSH 0.67 10/10/2016   INR 1.0 04/28/2014   HGBA1C 4.6 12/27/2013    No results found.  Assessment & Plan:   Problem List Items Addressed This Visit    Chronic bilateral low back pain    She was evaluated by [facility] neurosurgeon via telephone consult and no surgery was offered.  She has requested a referral to the local Pain Clinic , which I have ordered.  She has requested a stronger opioid, which I have refused to do since she lives alone and has already tried oxycodone which she states did not work as well as the vicodin.        Relevant Medications   HYDROcodone-acetaminophen (NORCO) 10-325 MG tablet   Spinal stenosis of thoracolumbar region - Primary   Relevant Orders   Ambulatory referral to Pain Clinic      I am having Ms. Neisen maintain her polyethylene glycol, multivitamin with minerals, promethazine, lisinopril, DULoxetine, sucralfate, pantoprazole, amLODipine, spironolactone, cyclobenzaprine, gabapentin, metoprolol succinate, and HYDROcodone-acetaminophen.  Meds ordered this encounter  Medications  . DISCONTD: HYDROcodone-acetaminophen (NORCO) 10-325 MG tablet    Sig: Take 1 tablet by mouth every 4 (four) hours as needed for severe pain (Pain).    Dispense:  180 tablet    Refill:  0    May refill on or after January 07 2017  . DISCONTD: HYDROcodone-acetaminophen (NORCO) 10-325 MG tablet    Sig: Take 1 tablet by mouth every  4 (four) hours as needed for severe pain (Pain).    Dispense:  180 tablet    Refill:  0    May refill on or after February 07 2017  . HYDROcodone-acetaminophen (NORCO) 10-325 MG tablet    Sig: Take 1 tablet by mouth every 4 (four) hours as needed for severe pain (Pain).    Dispense:  180 tablet    Refill:  0    May refill on or after March 09 2017  A total of 25 minutes of face to face time was spent with patient more than half of which was spent in counselling about the above mentioned conditions  and coordination of care   Medications Discontinued During This Encounter  Medication Reason  . HYDROcodone-acetaminophen (NORCO) 10-325 MG tablet Reorder  . HYDROcodone-acetaminophen (NORCO) 10-325 MG tablet Reorder  . HYDROcodone-acetaminophen (NORCO) 10-325 MG tablet Reorder    Follow-up: Return in about 4 months (around 04/05/2017), or medication refill .   [name], MD

## 2016-12-02 ENCOUNTER — Other Ambulatory Visit: Payer: Self-pay | Admitting: Internal Medicine

## 2016-12-02 NOTE — Assessment & Plan Note (Signed)
She was evaluated by [facility] neurosurgeon via telephone consult and no surgery was offered.  She has requested a referral to the local Pain Clinic , which I have ordered.  She has requested a stronger opioid, which I have refused to do since she lives alone and has already tried oxycodone which she states did not work as well as the vicodin.

## 2016-12-05 ENCOUNTER — Other Ambulatory Visit: Payer: Self-pay | Admitting: Internal Medicine

## 2016-12-08 DIAGNOSIS — C678 Malignant neoplasm of overlapping sites of bladder: Secondary | ICD-10-CM | POA: Diagnosis not present

## 2016-12-08 DIAGNOSIS — Z5111 Encounter for antineoplastic chemotherapy: Secondary | ICD-10-CM | POA: Diagnosis not present

## 2016-12-14 ENCOUNTER — Encounter: Payer: Self-pay | Admitting: Neurology

## 2016-12-14 ENCOUNTER — Ambulatory Visit (INDEPENDENT_AMBULATORY_CARE_PROVIDER_SITE_OTHER): Payer: Medicare Other | Admitting: Neurology

## 2016-12-14 VITALS — BP 135/77 | HR 77 | Ht 68.0 in | Wt 135.0 lb

## 2016-12-14 DIAGNOSIS — R2689 Other abnormalities of gait and mobility: Secondary | ICD-10-CM

## 2016-12-14 NOTE — Patient Instructions (Addendum)
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   Remember to drink plenty of fluid, eat healthy meals and do not skip any meals. Try to eat protein with a every meal and eat a healthy snack such as fruit or nuts in between meals. Try to keep a regular sleep-wake schedule and try to exercise daily, particularly in the form of walking with your walker if you can and light weight training.  Change positions slowly and you should continue to use your walker.    You can consider physical therapy again, with Dr. Derrel Nip. I do not see signs of parkinsonism.    As far as diagnostic testing: we will do a CT head without contrast, as you cannot have an MRI. We will call you with the results.   I will see you back as needed.
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
## 2016-12-15 DIAGNOSIS — C678 Malignant neoplasm of overlapping sites of bladder: Secondary | ICD-10-CM | POA: Diagnosis not present

## 2016-12-15 DIAGNOSIS — Z5111 Encounter for antineoplastic chemotherapy: Secondary | ICD-10-CM | POA: Diagnosis not present

## 2016-12-17 ENCOUNTER — Other Ambulatory Visit: Payer: Self-pay | Admitting: Cardiovascular Disease

## 2016-12-17 ENCOUNTER — Other Ambulatory Visit: Payer: Self-pay | Admitting: Internal Medicine

## 2016-12-19 NOTE — Telephone Encounter (Signed)
REFILL 

## 2016-12-19 NOTE — Telephone Encounter (Signed)
Refill Request.  

## 2016-12-21 ENCOUNTER — Ambulatory Visit
Admission: RE | Admit: 2016-12-21 | Discharge: 2016-12-21 | Disposition: A | Discharge status: Home / Self Care | Payer: Medicare Other | Source: Ambulatory Visit | Attending: Neurology | Admitting: Neurology

## 2016-12-21 DIAGNOSIS — R2689 Other abnormalities of gait and mobility: Secondary | ICD-10-CM

## 2016-12-22 NOTE — Progress Notes (Signed)
[facility] wo contrast was unremarkable.  Please call pt.  As discussed, FU with PCP.  Star Age, MD, PhD [facility])

## 2016-12-23 ENCOUNTER — Telehealth: Payer: Self-pay

## 2016-12-23 NOTE — Telephone Encounter (Signed)
I spoke with patient and made her aware that her CT head was unremarkable and to follow up with her PCP. Patient voiced understanding.

## 2016-12-23 NOTE — Telephone Encounter (Signed)
-----   Message from Star Age, MD sent at 12/22/2016  6:51 PM EDT ----- [facility] wo contrast was unremarkable.  Please call pt.  As discussed, FU with PCP.  Star Age, MD, PhD [facility])

## 2016-12-27 ENCOUNTER — Encounter: Payer: Self-pay | Admitting: Student in an Organized Health Care Education/Training Program

## 2016-12-27 ENCOUNTER — Ambulatory Visit
Payer: Medicare Other | Attending: Student in an Organized Health Care Education/Training Program | Admitting: Student in an Organized Health Care Education/Training Program

## 2016-12-27 VITALS — BP 119/74 | HR 84 | Temp 98.1°F | Resp 18 | Ht 68.0 in | Wt 135.0 lb

## 2016-12-27 DIAGNOSIS — M5116 Intervertebral disc disorders with radiculopathy, lumbar region: Secondary | ICD-10-CM | POA: Insufficient documentation

## 2016-12-27 DIAGNOSIS — M47816 Spondylosis without myelopathy or radiculopathy, lumbar region: Secondary | ICD-10-CM | POA: Diagnosis not present

## 2016-12-27 DIAGNOSIS — Z95810 Presence of automatic (implantable) cardiac defibrillator: Secondary | ICD-10-CM | POA: Insufficient documentation

## 2016-12-27 DIAGNOSIS — I5022 Chronic systolic (congestive) heart failure: Secondary | ICD-10-CM | POA: Diagnosis not present

## 2016-12-27 DIAGNOSIS — I110 Hypertensive heart disease with heart failure: Secondary | ICD-10-CM | POA: Diagnosis not present

## 2016-12-27 DIAGNOSIS — M48062 Spinal stenosis, lumbar region with neurogenic claudication: Secondary | ICD-10-CM | POA: Diagnosis not present

## 2016-12-27 DIAGNOSIS — M5136 Other intervertebral disc degeneration, lumbar region: Secondary | ICD-10-CM | POA: Diagnosis not present

## 2016-12-27 DIAGNOSIS — K219 Gastro-esophageal reflux disease without esophagitis: Secondary | ICD-10-CM | POA: Insufficient documentation

## 2016-12-27 DIAGNOSIS — F17210 Nicotine dependence, cigarettes, uncomplicated: Secondary | ICD-10-CM | POA: Diagnosis not present

## 2016-12-27 DIAGNOSIS — J449 Chronic obstructive pulmonary disease, unspecified: Secondary | ICD-10-CM | POA: Diagnosis not present

## 2016-12-27 DIAGNOSIS — I447 Left bundle-branch block, unspecified: Secondary | ICD-10-CM | POA: Insufficient documentation

## 2016-12-27 DIAGNOSIS — M4726 Other spondylosis with radiculopathy, lumbar region: Secondary | ICD-10-CM | POA: Diagnosis not present

## 2016-12-27 DIAGNOSIS — G894 Chronic pain syndrome: Secondary | ICD-10-CM | POA: Insufficient documentation

## 2016-12-27 DIAGNOSIS — I420 Dilated cardiomyopathy: Secondary | ICD-10-CM | POA: Diagnosis not present

## 2016-12-27 DIAGNOSIS — Z79899 Other long term (current) drug therapy: Secondary | ICD-10-CM | POA: Diagnosis not present

## 2016-12-27 NOTE — Progress Notes (Signed)
Safety precautions to be maintained throughout the outpatient stay will include: orient to surroundings, keep bed in low position, maintain call bell within reach at all times, provide assistance with transfer out of bed and ambulation.  

## 2016-12-27 NOTE — Progress Notes (Signed)
Patient's Name: [name]  MRN: [id number]  Referring Provider: [name], MD  DOB: [birth year]  PCP: [name], MD  DOS: 12/27/2016  Note by: [name], MD  Service setting: Ambulatory outpatient  Specialty: Interventional Pain Management  Location: [facility] (AMB) Pain Management Facility  Visit type: Initial Patient Evaluation  Patient type: New Patient   Primary Reason(s) for Visit: Encounter for initial evaluation of one or more chronic problems (new to examiner) potentially causing chronic pain, and posing a threat to normal musculoskeletal function. (Level of risk: High) CC: Back Pain (low)  HPI  [name] is a 76 y.o. year old, female patient, who comes today to see [place] for the first time for an initial evaluation of her chronic pain. She has Palpitations; Hyponatremia; Bladder cancer ([place]); LBBB (left bundle branch block); Atrial ectopy; Congestive dilated cardiomyopathy ([place]); History of gastric ulcer; Spinal stenosis of thoracolumbar region; COPD (chronic obstructive pulmonary disease) ([place]); Constipation; Spinal stenosis in cervical region; Chronic bilateral low back pain; NSAID induced gastritis; Parkinsonian features; and Hypertension on her problem list. Today she comes in for evaluation of her Back Pain (low)  Pain Assessment: Location: Lower, Right, Left Back Radiating: occasionally radiates into buttocks and sometimes into legs Onset: More than a month ago Duration: Chronic pain Quality: Sharp, Aching Severity: 4 /10 (self-reported pain score)  Note: Reported level is compatible with observation.                   When using our objective Pain Scale, levels between 6 and 10/10 are said to belong in an emergency room, as it progressively worsens from a 6/10, described as severely limiting, requiring emergency care not usually available at an outpatient pain management facility. At a 6/10 level, communication becomes difficult and requires great effort. Assistance to reach  the emergency department may be required. Facial flushing and profuse sweating along with potentially dangerous increases in heart rate and blood pressure will be evident. Effect on ADL:   Timing: Constant Modifying factors: lying down  Onset and Duration: Gradual and Date of onset: 3 years ago Cause of pain: Unknown Severity: Getting worse, NAS-11 at its worse: 8/10, NAS-11 at its best: 5/10, NAS-11 now: 5/10 and NAS-11 on the average: 5/10 Timing: Not influenced by the time of the day, During activity or exercise, After activity or exercise and After a period of immobility Aggravating Factors: Bending, Kneeling, Lifiting, Prolonged sitting, Prolonged standing, Squatting, Stooping , Walking and Working Alleviating Factors: Lying down, Medications, Resting and Sitting Associated Problems: Swelling, Weakness and Pain that wakes patient up Quality of Pain: Intermittent, Disabling, Nagging, Sharp, Sickening and Uncomfortable Previous Examinations or Tests: Biopsy, CT scan, Endoscopy, MRI scan, X-rays, Neurological evaluation and Neurosurgical evaluation Previous Treatments: Epidural steroid injections and Narcotic medications  The patient comes into the clinics today for the first time for a chronic pain management evaluation.   76 year old female who presents with axial bilateral low back pain that radiates into posterior thighs, buttocks and occasionally into bilateral legs. Patient has difficulty ambulating or standing erect for long periods of time. Endorses trembling of her legs and weakness that requires her to sit down. No loss of bowel or bladder function. Patient has been evaluated by neurosurgery and she was not deemed a surgical candidate. For the patient's pain, she is on Flexeril 5 mg 3 times a day when necessary, gabapentin 300 mg twice a day, Norco 10 mg 6 times a day when necessary (quantity 180 month).  Today I took the time to provide the patient with information regarding my pain  practice. The patient was informed that my practice is divided into two sections: an interventional pain management section, as well as a completely separate and distinct medication management section. I explained that I have procedure days for my interventional therapies, and evaluation days for follow-ups and medication management. Because of the amount of documentation required during both, they are kept separated. This means that there is the possibility that she may be scheduled for a procedure on one day, and medication management the next. I have also informed her that because of staffing and facility limitations, I no longer take patients for medication management only. To illustrate the reasons for this, I gave the patient the example of surgeons, and how inappropriate it would be to refer a patient to his/her care, just to write for the post-surgical antibiotics on a surgery done by a different surgeon.   Because interventional pain management is my board-certified specialty, the patient was informed that joining my practice means that they are open to any and all interventional therapies. I made it clear that this does not mean that they will be forced to have any procedures done. What this means is that I believe interventional therapies to be essential part of the diagnosis and proper management of chronic pain conditions. Therefore, patients not interested in these interventional alternatives will be better served under the care of a different practitioner.  The patient was also made aware of my Comprehensive Pain Management Safety Guidelines where by joining my practice, they limit all of their nerve blocks and joint injections to those done by our practice, for as long as we are retained to manage their care.   Historic Controlled Substance Pharmacotherapy Review  PMP and historical list of controlled substances: hydrocodone 10 mg up to 6 times a day, quantity 180 per month. Highest opioid  analgesic regimen found: as above Most recent opioid analgesic: as above Current opioid analgesics: as above Highest recorded MME/day: 60 mg/day MME/day: 71m/day Medications: The patient did not bring the medication(s) to the appointment, as requested in our "New Patient Package" Pharmacodynamics: Desired effects: Analgesia: The patient reports >50% benefit. Reported improvement in function: The patient reports medication allows her to accomplish basic ADLs. Clinically meaningful improvement in function (CMIF): Sustained CMIF goals met Perceived effectiveness: Described as relatively effective, allowing for increase in activities of daily living (ADL) Undesirable effects: Side-effects or Adverse reactions: None reported Historical Monitoring: The patient  reports that she does not use drugs. List of all UDS Test(s): No results found for: MDMA, COCAINSCRNUR, PCPSCRNUR, PCPQUANT, CANNABQUANT, THCU, EPaul SmithsList of all Serum Anderson Screening Test(s):  No results found for: AMPHSCRSER, BARBSCRSER, BENZOSCRSER, COCAINSCRSER, PCPSCRSER, PCPQUANT, THCSCRSER, CANNABQUANT, OPIATESCRSER, OXYSCRSER, PROPOXSCRSER Historical Background Evaluation: [place] PMP: Six (6) year initial data search conducted.             PMP NARX Score Report:  Narcotic: 381 Sedative: 160 Stimulant: 0 [place] Department of public safety, offender search: (Editor, commissioningInformation) Non-contributory Risk Assessment Profile: Aberrant behavior: None observed or detected today Risk factors for fatal opioid overdose: None identified today PMP NARX Overdose Risk Score: 190 Fatal overdose hazard ratio (HR): Calculation deferred Non-fatal overdose hazard ratio (HR): Calculation deferred Risk of opioid abuse or dependence: 0.7-3.0% with doses ? 36 MME/day and 6.1-26% with doses ? 120 MME/day. Substance use disorder (SUD) risk level: Pending results of Medical Psychology Evaluation for SUD Opioid risk tool (ORT) (Total Score):  0     Opioid Risk  Tool - 12/27/16 1156      Family History of Substance Abuse   Alcohol Negative   Illegal Drugs Negative   Rx Drugs Negative     Personal History of Substance Abuse   Alcohol Negative   Illegal Drugs Negative   Rx Drugs Negative     Age   Age between 62-45 years  No     History of Preadolescent Sexual Abuse   History of Preadolescent Sexual Abuse Negative or Female     Psychological Disease   Psychological Disease Negative   Depression Negative     Total Score   Opioid Risk Tool Scoring 0   Opioid Risk Interpretation Low Risk     ORT Scoring interpretation table:  Score <3 = Low Risk for SUD  Score between 4-7 = Moderate Risk for SUD  Score >8 = High Risk for Opioid Abuse    Pharmacologic Plan: Pending ordered tests and/or consults            Initial impression: Pending review of available data and ordered tests.  Meds   Current Outpatient Prescriptions:  .  amLODipine (NORVASC) 10 MG tablet, TAKE 1/2 TABLET(5MG) BY MOUTH TWICE DAILY**NOTE DOSE**, Disp: 30 tablet, Rfl: 0 .  cyclobenzaprine (FLEXERIL) 5 MG tablet, Take 1 tablet (5 mg total) by mouth 3 (three) times daily as needed for muscle spasms., Disp: 90 tablet, Rfl: 1 .  gabapentin (NEURONTIN) 300 MG capsule, TAKE 1 CAPSULE BY MOUTH TWICE DAILY EVERY NIGHT AT BEDTIME AS DIRECTED, Disp: 60 capsule, Rfl: 0 .  HYDROcodone-acetaminophen (NORCO) 10-325 MG tablet, Take 1 tablet by mouth every 4 (four) hours as needed for severe pain (Pain)., Disp: 180 tablet, Rfl: 0 .  lisinopril (PRINIVIL,ZESTRIL) 40 MG tablet, TAKE 1 TABLET(40 MG) BY MOUTH DAILY, Disp: 30 tablet, Rfl: 0 .  metoprolol succinate (TOPROL-XL) 25 MG 24 hr tablet, Take 1 tablet (25 mg total) by mouth daily., Disp: 30 tablet, Rfl: 3 .  pantoprazole (PROTONIX) 40 MG tablet, TAKE 1 TABLET(40 MG) BY MOUTH TWICE DAILY, Disp: 60 tablet, Rfl: 0 .  polyethylene glycol (MIRALAX / GLYCOLAX) packet, Take 17 g by mouth daily as needed for mild constipation. , Disp: , Rfl:   .  promethazine (PHENERGAN) 12.5 MG tablet, TAKE 1 TABLET BY MOUTH EVERY 8 HOURS AS NEEDED FOR NAUSEA OR VOMITING, Disp: 60 tablet, Rfl: 0 .  spironolactone (ALDACTONE) 25 MG tablet, TK 1 T PO D, Disp: , Rfl: 1 .  spironolactone (ALDACTONE) 25 MG tablet, TAKE 1 TABLET BY MOUTH DAILY, Disp: 30 tablet, Rfl: 5 .  DULoxetine (CYMBALTA) 30 MG capsule, TAKE 1 CAPSULE(30 MG) BY MOUTH DAILY (Patient not taking: Reported on 12/27/2016), Disp: 30 capsule, Rfl: 0 .  sucralfate (CARAFATE) 1 g tablet, TAKE 1 TABLET BY MOUTH FOUR TIMES DAILY EVERY NIGHT AT BEDTIME WITH MEALS, Disp: 120 tablet, Rfl: 0  Imaging Review  Cervical Imaging: Cervical MR wo contrast:  Results for orders placed in visit on 02/12/14  MR Cervical Spine Wo Contrast   Cervical MR wo contrast:  Results for orders placed in visit on 01/16/14  MR [facility] W/O Cm   Narrative   CLINICAL DATA:  Neck pain with BILATERAL arm pain for months.   EXAM:  MRI CERVICAL SPINE WITHOUT CONTRAST   TECHNIQUE:  Multiplanar, multisequence MR imaging of the cervical spine was  performed. No intravenous contrast was administered.   COMPARISON:  None.   FINDINGS:  Alignment: Normal.   Vertebrae: Normal.   Cord: Mild cord flattening C4 through C7 without definite abnormal  cord signal.   Posterior Fossa: Normal.  No tonsillar herniation.   Vertebral Arteries: Patent, with RIGHT vertebral dominant.   Paraspinal tissues: Normal.   Disc levels:   The individual disc spaces were examined as follows:   C2-3:  Normal.   C3-4:  Central protrusion without impingement.   C4-5: Central disc osteophyte complex BILATERAL uncinate spurring.  Mild cord flattening with canal diameter 7 mm.   C5-6: Central disc osteophyte complex with BILATERAL uncinate  spurring, LEFT greater than RIGHT. Mild cord flattening with canal  diameter 7 mm.   C6-7: Shallow central protrusion. BILATERAL uncinate spurring could  affect either C7 nerve root.  Slight effacement anterior subarachnoid  space and slight ventral cord flattening, without significant cord  compression.   C7-T1:  Normal.   IMPRESSION:  Multilevel spondylosis as described. Potentially symptomatic neural  impingement from C4 through C7.    Electronically Signed    By: Rolla Flatten M.D.    On: 01/16/2014 13:30       Cervical DG complete:  Results for orders placed during the hospital encounter of 12/27/13  DG Cervical Spine Complete   Narrative CLINICAL DATA:  Neuropathy.  Neck pain  EXAM: CERVICAL SPINE  4+ VIEWS  COMPARISON:  None.  FINDINGS: Normal cervical alignment. Negative for fracture or mass. Disc degeneration and spondylosis at C5-6 and C6-7. Mild spurring at C4-5. This is causing foraminal encroachment bilaterally at C4-5 and C5-6 and C6-7. Mild facet degeneration.  IMPRESSION: Cervical degenerative changes as above.  No acute bony abnormality.   Electronically Signed   By: Franchot Gallo M.D.   On: 12/27/2013 12:59    Lumbar MR wo contrast:  Results for orders placed in visit on 02/17/14  MR [place] W/O Cm   Narrative   CLINICAL DATA:  Lumbar spinal stenosis. Low back pain radiating into  the buttocks and hips. Weakness in the legs. Symptoms present for  1.5 years. Remote history of lumbar surgery for ruptured disc in the  1980s.   EXAM:  MRI LUMBAR SPINE WITHOUT CONTRAST   TECHNIQUE:  Multiplanar, multisequence MR imaging of the lumbar spine was  performed. No intravenous contrast was administered.   COMPARISON:  Lumbar spine radiographs 01/28/2014. CT abdomen and  pelvis 11/24/2013.   FINDINGS:  There is moderate thoracolumbar dextroscoliosis. There is trace  retrolisthesis of L4 on L5. There is diffuse lumbar disc  desiccation. Disc space narrowing is severe at L4-5, asymmetric on  the right, with moderate associated discogenic marrow edema. There  is moderate left-sided disc space narrowing at L2-3 with mild   discogenic marrow edema. Vertebral body heights are preserved  without compression fracture. Small Schmorl's nodes are noted at  T11-12, T12-L1, and L1-2. Conus medullaris is normal in signal and  terminates at L1. Paraspinal soft tissues are unremarkable.   T12-L1: Only imaged sagittally. Mild-to-moderate disc bulging with  likely mild spinal stenosis but no significant mass effect on the  distal cord/conus.   L1-2: Moderate disc bulging asymmetric to the left and mild facet  hypertrophy result in mild left lateral recess stenosis and moderate  to severe left neural foraminal stenosis. No significant spinal  canal stenosis.   L2-3: Circumferential disc bulging and moderate facet and ligamentum  flavum hypertrophy result in severe spinal stenosis, left greater  than right lateral recess stenosis, and severe left neural foraminal  stenosis. 4 mm synovial cyst is noted in the ligamentum flavum at  the medial aspect of the right facet joint.   L3-4: Circumferential disc bulging and severe facet and ligamentum  flavum hypertrophy result in severe spinal stenosis and moderate  right and mild left neural foraminal stenosis.   L4-5: Prior left hemilaminectomy is noted. Disc bulging and endplate  spurring asymmetric to the right, right ligamentum flavum  hypertrophy, and moderate right and mild left facet hypertrophy  result in mild bilateral lateral recess stenosis and moderate right  neural foraminal stenosis. No spinal canal stenosis.   L5-S1: Shallow central disc protrusion and moderate to severe facet  hypertrophy without stenosis.   IMPRESSION:  Thoracolumbar scoliosis with advanced multilevel lumbar disc and  facet degeneration resulting in severe multifactorial spinal  stenosis at L2-3 and L3-4.    Electronically Signed    By: Logan Bores    On: 02/17/2014 14:29        Lumbar CT wo contrast:  Results for orders placed during the hospital encounter of 05/27/16  CT  Lumbar Spine Wo Contrast   Narrative CLINICAL DATA:  Spinal stenosis. Radiculopathy. Low back pain and weakness.  EXAM: CT LUMBAR SPINE WITHOUT CONTRAST  TECHNIQUE: Multidetector CT imaging of the lumbar spine was performed without intravenous contrast administration. Multiplanar CT image reconstructions were also generated.  COMPARISON:  None.  FINDINGS: Segmentation: 5 lumbar type vertebrae.  Alignment: No static listhesis. Dextroscoliosis of the lumbar spine.  Vertebrae: Vertebral body heights are maintained. No aggressive osseous lesion.  Paraspinal and other soft tissues: No paraspinal abnormality. Abdominal aortic atherosclerosis.  Disc levels: Degenerative disc disease with disc height loss throughout the lumbar spine most severe at L2-3 and L4-5.  At T12-L1 there is a broad-based disc osteophyte complex. Mild bilateral facet arthropathy and severe spinal stenosis. Severe left foraminal stenosis. No right foraminal stenosis.  At L2-3 there is a broad-based disc osteophyte complex eccentric towards the left. Severe bilateral facet arthropathy and severe spinal stenosis. Bilateral foraminal stenosis.  At L3-4 there is a broad-based disc bulge is centric towards the left. Severe bilateral facet arthropathy at L3-4. Severe bilateral facet arthropathy at L3-4 with severe spinal stenosis. Severe right foraminal stenosis. No left foraminal stenosis.  At L4-5 there is a mild broad-based disc osteophyte complex. Prior laminectomy. Moderate bilateral facet arthropathy. Mild bilateral foraminal stenosis.  At L5-S1 there is bilateral facet arthropathy. No foraminal stenosis.  IMPRESSION: 1. Diffuse lumbar spine spondylosis as described above. 2. No acute osseous injury of the lumbar spine.   Electronically Signed   By: Kathreen Devoid   On: 05/27/2016 17:22     Lumbar DG 2-3 views:  Results for orders placed during the hospital encounter of 11/22/13  DG Lumbar  Spine 2-3 Views   Narrative CLINICAL DATA:  Lumbar spine pain for 1 year radiating to the buttocks. History of back surgery due to herniated disc  EXAM: LUMBAR SPINE - 2-3 VIEW  COMPARISON:  None.  FINDINGS: There are 5 non rib-bearing lumbar type vertebral bodies.  There is a mild to moderate scoliotic curvature of the thoracolumbar spine with dominant cranial component convex to the right. No definite anterolisthesis or retrolisthesis.  Lumbar vertebral body heights are preserved given obliquity.  There is moderate to severe multilevel lumbar spine DDD, likely worse at L1-L2, L2-L3 and L4-L5 with disc space height loss, endplate irregularity and sclerosis.  Atherosclerotic plaque within the abdominal aorta.  Bowel-gas pattern regional soft tissues appear normal. No  radiopaque foreign body.  IMPRESSION: 1. No definite acute findings. 2. Mild to moderate scoliotic curvature of the thoracolumbar spine with associated moderate to severe multilevel lumbar spine DDD.   Electronically Signed   By: Sandi Mariscal M.D.   On: 11/23/2013 15:02      Complexity Note: Imaging results reviewed. Results shared with [name], using Layman's terms.                         ROS  Cardiovascular History: High blood pressure and Pacemaker or defibrillator Pulmonary or Respiratory History: Shortness of breath and Smoking Neurological History: Curved spine Review of Past Neurological Studies:  Results for orders placed or performed during the hospital encounter of 12/21/16  CT HEAD WO CONTRAST   Narrative   [facility]  NEUROIMAGING REPORT   STUDY DATE: 12/21/16 PATIENT NAME: VICTORINA KABLE DOB: 1940-06-22 MRN: [id number]  ORDERING CLINICIAN: Star Age, MD PhD  CLINICAL HISTORY: 76 year old female with gait difficulty.  EXAM: CT head (without)  TECHNIQUE: CT scan of the head was obtained utilizing 5 mm axial slices  from the skull base to the vertex. CONTRAST:  no COMPARISON: none  IMAGING SITE: [facility] [address]  FINDINGS:   The cortical sulci, fissures and cisterns are normal in size and  appearance.  Lateral, third and fourth ventricle are normal in size and  appearance.  No extra-axial fluid collections are seen.  No intracranial  hemorrhage.  No evidence of mass effect or midline shift.    Mild periventricular and subcortical chronic small vessel ischemic  disease.   The orbits and their contents, paranasal sinuses and calvarium are  unremarkable.    Right vertebral artery calcification.     Impression   Unremarkable CT head (without). No acute findings.     INTERPRETING PHYSICIAN:  Penni Bombard, MD Certified in Neurology, Neurophysiology and Neuroimaging  [facility] [address], [place], [place] [address] [phone]    Psychological-Psychiatric History: No reported psychological or psychiatric signs or symptoms such as difficulty sleeping, anxiety, depression, delusions or hallucinations (schizophrenial), mood swings (bipolar disorders) or suicidal ideations or attempts Gastrointestinal History: Reflux or heatburn Genitourinary History: No reported renal or genitourinary signs or symptoms such as difficulty voiding or producing urine, peeing blood, non-functioning kidney, kidney stones, difficulty emptying the bladder, difficulty controlling the flow of urine, or chronic kidney disease Hematological History: No reported hematological signs or symptoms such as prolonged bleeding, low or poor functioning platelets, bruising or bleeding easily, hereditary bleeding problems, low energy levels due to low hemoglobin or being anemic Endocrine History: No reported endocrine signs or symptoms such as high or low blood sugar, rapid heart rate due to high thyroid levels, obesity or weight gain due to slow thyroid or thyroid disease Rheumatologic History: No reported  rheumatological signs and symptoms such as fatigue, joint pain, tenderness, swelling, redness, heat, stiffness, decreased range of motion, with or without associated rash Musculoskeletal History: Negative for myasthenia gravis, muscular dystrophy, multiple sclerosis or malignant hyperthermia Work History: Retired  Allergies  [name] has No Known Allergies.  Laboratory Chemistry  Inflammation Markers (CRP: Acute Phase) (ESR: Chronic Phase) No results found for: CRP, ESRSEDRATE               Renal Function Markers Lab Results  Component Value Date   BUN 10 11/02/2016   CREATININE 0.69 11/02/2016   GFRAA >60 10/02/2015  GFRNONAA >60 10/02/2015                 Hepatic Function Markers Lab Results  Component Value Date   AST 23 05/24/2016   ALT 15 05/24/2016   ALBUMIN 4.7 05/24/2016   ALKPHOS 95 05/24/2016                 Electrolytes Lab Results  Component Value Date   NA 133 (L) 11/02/2016   K 4.2 11/02/2016   CL 98 11/02/2016   CALCIUM 10.1 11/02/2016                 Neuropathy Markers Lab Results  Component Value Date   VITAMINB12 1,069 (H) 12/27/2013                 Bone Pathology Markers Lab Results  Component Value Date   ALKPHOS 95 05/24/2016   CALCIUM 10.1 11/02/2016                 Coagulation Parameters Lab Results  Component Value Date   INR 1.0 04/28/2014   LABPROT 10.3 04/28/2014   APTT 27 11/22/2013   PLT 209 10/02/2015                 Cardiovascular Markers Lab Results  Component Value Date   HGB 13.5 10/02/2015   HCT 40.4 10/02/2015                 Note: Lab results reviewed.  Bianca  Anderson: [name]  reports that she does not use drugs. Alcohol:  reports that she does not drink alcohol. Tobacco:  reports that she has been smoking Cigarettes.  She has a 27.50 pack-year smoking history. She has never used smokeless tobacco. Medical:  has a past medical history of AICD (automatic cardioverter/defibrillator) present; Anemia; Arthritis;  Bladder cancer ([place]) (1995; 2015); CHF (congestive heart failure) ([place]); Chronic lower back pain; COPD (chronic obstructive pulmonary disease) ([place]); Ectopic atrial beats; Frequent urination; GERD (gastroesophageal reflux disease); History of stomach ulcers (11/2013); Hypertension; LBBB (left bundle branch block); and Migraines. Family: family history includes Cancer in her brother and mother.  Past Surgical History:  Procedure Laterality Date  . BACK SURGERY    . BI-VENTRICULAR IMPLANTABLE CARDIOVERTER DEFIBRILLATOR N/A 05/07/2014   Procedure: BI-VENTRICULAR IMPLANTABLE CARDIOVERTER DEFIBRILLATOR  (CRT-D);  Surgeon: Deboraha Sprang, MD;  Location: [facility] CATH LAB;  Service: Cardiovascular;  Laterality: N/A;  . CARDIAC CATHETERIZATION  ~ 02/2014  . CARDIAC DEFIBRILLATOR PLACEMENT  05/07/2014   dual chamber  . CATARACT EXTRACTION W/ INTRAOCULAR LENS  IMPLANT, BILATERAL Bilateral 12-2013 - 02-2014  . CYSTOSCOPY W/ RETROGRADES Bilateral 01/06/2015   Procedure: CYSTOSCOPY WITH  BLADDER BIOPSY BILATERAL RETROGRADE PYELOGRAM,INSTILLATION OF MITOMYCIN C;  Surgeon: [name], MD;  Location: [facility];  Service: Urology;  Laterality: Bilateral;  . CYSTOSCOPY WITH BIOPSY N/A 10/06/2015   Procedure: CYSTO WITH BLADDER BIOPSY, FULGERATION, CHEMO IRRIGATION EPIRUBICIN IN PACU;  Surgeon: [name], MD;  Location: [facility];  Service: Urology;  Laterality: N/A;  . CYSTOSCOPY WITH RETROGRADE PYELOGRAM, URETEROSCOPY AND STENT PLACEMENT Bilateral 04/18/2014   Procedure: CYSTOSCOPY WITH RETROGRADE PYELOGRAM;  Surgeon: [name], MD;  Location: [facility];  Service: Urology;  Laterality: Bilateral;  . ESOPHAGOGASTRODUODENOSCOPY N/A 11/28/2013   Procedure: ESOPHAGOGASTRODUODENOSCOPY (EGD);  Surgeon: Arta Silence, MD;  Location: [facility] ENDOSCOPY;  Service: Endoscopy;  Laterality: N/A;  . LUMBAR [place]  1980's   "ruptured disc"  . TRANSURETHRAL RESECTION OF BLADDER TUMOR N/A 12/13/2013  Procedure: TRANSURETHRAL  RESECTION OF BLADDER TUMOR (TURBT);  Surgeon: [name], MD;  Location: [facility];  Service: Urology;  Laterality: N/A;  . TRANSURETHRAL RESECTION OF BLADDER TUMOR N/A 01/17/2014   Procedure: TRANSURETHRAL RESECTION OF BLADDER TUMOR (TURBT);  Surgeon: [name], MD;  Location: [facility];  Service: Urology;  Laterality: N/A;  . TRANSURETHRAL RESECTION OF BLADDER TUMOR N/A 04/18/2014   Procedure: TRANSURETHRAL RESECTION OF BLADDER TUMOR (TURBT), CYSTOGRAM;  Surgeon: [name], MD;  Location: [facility];  Service: Urology;  Laterality: N/A;   Active Ambulatory Problems    Diagnosis Date Noted  . Palpitations 11/22/2013  . Hyponatremia 11/22/2013  . Bladder cancer ([place]) 11/24/2013  . LBBB (left bundle branch block) 11/24/2013  . Atrial ectopy 11/24/2013  . Congestive dilated cardiomyopathy ([place]) 11/25/2013  . History of gastric ulcer 11/28/2013  . Spinal stenosis of thoracolumbar region 12/27/2013  . COPD (chronic obstructive pulmonary disease) ([place]) 12/27/2013  . Constipation 12/29/2013  . Spinal stenosis in cervical region 01/19/2014  . Chronic bilateral low back pain 05/15/2016  . NSAID induced gastritis 05/26/2016  . 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 Hypertension 09/06/2016   Resolved Ambulatory Problems    Diagnosis Date Noted  . Anemia, iron deficiency 11/22/2013  . Chest pain 11/22/2013  . Epigastric pain 11/22/2013  . Hematuria 11/24/2013  . Preoperative cardiovascular examination 11/27/2013  . Bladder neoplasm 12/13/2013  . Chronic systolic heart failure ([place]) 12/17/2013  . Anemia, posthemorrhagic, acute 02/08/2014  . Chronic systolic congestive heart failure, NYHA class 3 ([place]) 05/07/2014   Past Medical History:  Diagnosis Date  . AICD (automatic cardioverter/defibrillator) present   . Anemia   . Arthritis   . Bladder cancer ([place]) 1995; 2015  . CHF (congestive heart failure) ([place])   . Chronic lower back pain   . COPD (chronic obstructive pulmonary  disease) ([place])   . Ectopic atrial beats   . Frequent urination   . GERD (gastroesophageal reflux disease)   . History of stomach ulcers 11/2013  . Hypertension   . LBBB (left bundle branch block)   . Migraines    Constitutional Exam  General appearance: Well nourished, well developed, and well hydrated. In no apparent acute distress Vitals:   12/27/16 1150  BP: 119/74  Pulse: 84  Resp: 18  Temp: 98.1 F (36.7 C)  SpO2: 100%  Weight: 135 lb (61.2 kg)  Height: 5' 8"  (1.727 m)   BMI Assessment: Estimated body mass index is 20.53 kg/m as calculated from the following:   Height as of this encounter: 5' 8"  (1.727 m).   Weight as of this encounter: 135 lb (61.2 kg).  BMI interpretation table: BMI level Category Range association with higher incidence of chronic pain  <18 kg/m2 Underweight   18.5-24.9 kg/m2 Ideal body weight   25-29.9 kg/m2 Overweight Increased incidence by 20%  30-34.9 kg/m2 Obese (Class I) Increased incidence by 68%  35-39.9 kg/m2 Severe obesity (Class II) Increased incidence by 136%  >40 kg/m2 Extreme obesity (Class III) Increased incidence by 254%   BMI Readings from Last 4 Encounters:  12/27/16 20.53 kg/m  12/14/16 20.53 kg/m  11/30/16 20.50 kg/m  11/02/16 20.28 kg/m   Wt Readings from Last 4 Encounters:  12/27/16 135 lb (61.2 kg)  12/14/16 135 lb (61.2 kg)  11/30/16 134 lb 12.8 oz (61.1 kg)  11/02/16 133 lb 6.4 oz (60.5 kg)  Psych/Mental status: Alert, oriented x 3 (person, place, & time)       Eyes: PERLA Respiratory: No evidence of  acute respiratory distress  Cervical Spine Area Exam  Skin & Axial Inspection: No masses, redness, edema, swelling, or associated skin lesions Alignment: Symmetrical Functional ROM: Decreased ROM, bilaterally Stability: No instability detected Muscle Tone/Strength: Functionally intact. No obvious neuro-muscular anomalies detected. Sensory (Neurological): Movement-associated pain Palpation: Complains of area  being tender to palpation Positive provocative maneuver for for cervical facet disease Limited cervical extension secondary to pain Upper Extremity (UE) Exam    Side: Right upper extremity  Side: Left upper extremity  Skin & Extremity Inspection: Skin color, temperature, and hair growth are WNL. No peripheral edema or cyanosis. No masses, redness, swelling, asymmetry, or associated skin lesions. No contractures.  Skin & Extremity Inspection: Skin color, temperature, and hair growth are WNL. No peripheral edema or cyanosis. No masses, redness, swelling, asymmetry, or associated skin lesions. No contractures.  Functional ROM: Unrestricted ROM          Functional ROM: Unrestricted ROM          Muscle Tone/Strength: Functionally intact. No obvious neuro-muscular anomalies detected.  Muscle Tone/Strength: Functionally intact. No obvious neuro-muscular anomalies detected.  Sensory (Neurological): Unimpaired          Sensory (Neurological): Unimpaired          Palpation: No palpable anomalies              Palpation: No palpable anomalies              Specialized Test(s): Deferred         Specialized Test(s): Deferred          Thoracic Spine Area Exam  Skin & Axial Inspection: No masses, redness, or swelling Alignment: Asymmetric Functional ROM: Decreased ROM Stability: No instability detected Muscle Tone/Strength: Functionally intact. No obvious neuro-muscular anomalies detected. Sensory (Neurological): Movement associated discomfort Muscle strength & Tone: Complains of area being tender to palpation  Lumbar Spine Area Exam  Skin & Axial Inspection: No masses, redness, or swelling Alignment: Asymmetric Functional ROM: Decreased ROM      Stability: No instability detected Muscle Tone/Strength: Functionally intact. No obvious neuro-muscular anomalies detected. Sensory (Neurological): Movement-associated pain Palpation: Complains of area being tender to palpation Bilateral Fist Percussion  Test Provocative Tests: Lumbar Hyperextension and rotation test: Positive bilaterally for facet joint pain. Lumbar Lateral bending test: Positive       Patrick's Maneuver: evaluation deferred today                    Gait & Posture Assessment  Ambulation: Patient ambulates using a walker Gait: Limited. Using assistive device to ambulate Posture: Difficulty standing up straight, due to pain   Lower Extremity Exam    Side: Right lower extremity  Side: Left lower extremity  Skin & Extremity Inspection: Skin color, temperature, and hair growth are WNL. No peripheral edema or cyanosis. No masses, redness, swelling, asymmetry, or associated skin lesions. No contractures.  Skin & Extremity Inspection: Skin color, temperature, and hair growth are WNL. No peripheral edema or cyanosis. No masses, redness, swelling, asymmetry, or associated skin lesions. No contractures.  Functional ROM: Unrestricted ROM          Functional ROM: Unrestricted ROM          Muscle Tone/Strength: Deconditioned  Muscle Tone/Strength: Deconditioned  Sensory (Neurological): Unimpaired  Sensory (Neurological): Unimpaired  Palpation: No palpable anomalies  Palpation: No palpable anomalies   Assessment  Primary Diagnosis & Pertinent Problem List: The primary encounter diagnosis was Lumbar spondylosis. Diagnoses of Lumbar degenerative disc disease,  Spinal stenosis, lumbar region, with neurogenic claudication, Lumbar facet arthropathy, and Chronic pain syndrome were also pertinent to this visit.  Visit Diagnosis (New problems to examiner): 1. Lumbar spondylosis   2. Lumbar degenerative disc disease   3. Spinal stenosis, lumbar region, with neurogenic claudication   4. Lumbar facet arthropathy   5. Chronic pain syndrome    General Recommendations: The pain condition that the patient suffers from is best treated with a multidisciplinary approach that involves an increase in physical activity to prevent de-conditioning and  worsening of the pain cycle, as well as psychological counseling (formal and/or informal) to address the co-morbid psychological affects of pain. Treatment will often involve judicious use of pain medications and interventional procedures to decrease the pain, allowing the patient to participate in the physical activity that will ultimately produce long-lasting pain reductions. The goal of the multidisciplinary approach is to return the patient to a higher level of overall function and to restore their ability to perform activities of daily living.  76 year old female who presents with axial bilateral low back pain that radiates into posterior thighs, buttocks and occasionally into bilateral legs. Patient has difficulty ambulating or standing erect for long periods of time. Endorses trembling of her legs and weakness that requires her to sit down. No loss of bowel or bladder function. Patient has been evaluated by neurosurgery and she was not deemed a surgical candidate. For the patient's pain, she is on Flexeril 5 mg 3 times a day when necessary, gabapentin 300 mg twice a day, Norco 10 mg 6 times a day when necessary (quantity 180 month).  Patient's axial low back pain with radiation to bilateral buttocks and legs secondary to lumbar spinal stenosis with neurogenic claudication along with lumbar spondylosis, lumbar facet hypertrophy and arthropathy. Patient has significant pain with lumbar extension and palpation overlying lumbar facets. For this sort of pain, we discussed bilateral lumbar diagnostic facet blocks with goal radiofrequency ablation if effective. Patient understands risks and benefits and would like to proceed. She is not on any blood thinners.  In order for [place] to take over medication management, which includes opioid therapy, we will perform a urine Anderson screen today which will be positive for patients hydrocodone and its metabolites. We will also send the patient for psychological  evaluationregarding substance abuse disorder which is standard for all new patients being considered for opioid therapy at this clinic. Patient has prescriptions for the next 3 months and will need opioid refill in early December. I am hopeful that she will be able to see psychology prior to that point so that we can take over her medication regimen including opioid therapy at that time.  Plan: -Urine Anderson screen today, should only be positive for hydrocodone and its metabolites -Referral to pain psychology which is standard for all new patients regarding risk of substance abuse disorder. -continue Flexeril, gabapentin, and hydrocodone 10 mg up to 6 times a day as previously prescribed -Schedule for bilateral diagnostic lumbar facet blocks, L3-L5 with goal radiofrequency ablation if effective.   Ordered Lab-work, Procedure(s), Referral(s), & Consult(s): Orders Placed This Encounter  Procedures  . LUMBAR FACET(MEDIAL BRANCH NERVE BLOCK) MBNB  . Compliance Anderson Analysis, Ur  . Ambulatory referral to Psychology    Pharmacological management options:  Opioid Analgesics: pending UDS and pain psych eval, we'll take over patient's hydrocodone at current dose of 10 mg up to 6 times a day, quantity 180 a month.  Membrane stabilizer: currently on gabapentin 300 mg twice daily  Muscle relaxant: Flexeril 5 mg 3 times a day when necessary. Can consider Robaxin, baclofen, tizanidine in the future.  NSAID: To be determined at a later time  Other analgesic(s): consider topical therapies and creams including compounded cream, TENS unit.   Interventional management options: [name] was informed that there is no guarantee that she would be a candidate for interventional therapies. The decision will be based on the results of diagnostic studies, as well as [name] risk profile.  Procedure(s) under consideration:  -diagnostic bilateral lumbar facet blocks with sedation, goal RFA -caudal epidural steroid  injection   Provider-requested follow-up: Return for Procedure.  Future Appointments Date Time Provider [place]  01/04/2017 12:00 PM [name], MD [facility] None  03/24/2017 1:30 PM [name], MD [facility] None  05/16/2017 3:00 PM O'Brien-Blaney, Bryson Corona, LPN [facility] None    Primary Care Physician: [name], MD Location: [facility] Note by: [name], M.D, Date: 12/27/2016; Time: 3:31 PM  Patient Instructions  1. UDS today 2. Pain Psychology referral. Must complete this before I can start opioid therapy. You have prescriptions from her current provider until December. 3. Diagnostic bilateral lumbar facets with sedation earliest available.   DO NOT EAT OR DRINK FOR 8 HOURS PRIOR TO PROCEDURE BRING A DRIVER TAKE YOUR BLOOD PRESSURE MEDICATION THE MORNING OF YOUR PROCEDURE WITH A SMALL SIP OF WATER.  DO NOT GET THE FLU SHOT WITHIN 2 WEEKS OF A PROCEDURE, BEFORE OR AFTER  Facet Blocks Patient Information  Description: The facets are joints in the spine between the vertebrae.  Like any joints in the body, facets can become irritated and painful.  Arthritis can also effect the facets.  By injecting steroids and local anesthetic in and around these joints, we can temporarily block the nerve supply to them.  Steroids act directly on irritated nerves and tissues to reduce selling and inflammation which often leads to decreased pain.  Facet blocks may be done anywhere along the spine from the neck to the low back depending upon the location of your pain.   After numbing the skin with local anesthetic (like Novocaine), a small needle is passed onto the facet joints under x-ray guidance.  You may experience a sensation of pressure while this is being done.  The entire block usually lasts about 15-25 minutes.   Conditions which may be treated by facet blocks:   Low back/buttock pain  Neck/shoulder pain  Certain types of  headaches  Preparation for the injection:  1. Do not eat any solid food or dairy products within 8 hours of your appointment. 2. You may drink clear liquid up to 3 hours before appointment.  Clear liquids include water, black coffee, juice or soda.  No milk or cream please. 3. You may take your regular medication, including pain medications, with a sip of water before your appointment.  Diabetics should hold regular insulin (if taken separately) and take 1/2 normal NPH dose the morning of the procedure.  Carry some sugar containing items with you to your appointment. 4. A driver must accompany you and be prepared to drive you home after your procedure. 5. Bring all your current medications with you. 6. An IV may be inserted and sedation may be given at the discretion of the physician. 7. A blood pressure cuff, EKG and other monitors will often be applied during the procedure.  Some patients may need to have extra oxygen administered for a short period. 8. You  will be asked to provide medical information, including your allergies and medications, prior to the procedure.  We must know immediately if you are taking blood thinners (like Coumadin/Warfarin) or if you are allergic to IV iodine contrast (dye).  We must know if you could possible be pregnant.  Possible side-effects:   Bleeding from needle site  Infection (rare, may require surgery)  Nerve injury (rare)  Numbness & tingling (temporary)  Difficulty urinating (rare, temporary)  Spinal headache (a headache worse with upright posture)  Light-headedness (temporary)  Pain at injection site (serveral days)  Decreased blood pressure (rare, temporary)  Weakness in arm/leg (temporary)  Pressure sensation in back/neck (temporary)   Call if you experience:   Fever/chills associated with headache or increased back/neck pain  Headache worsened by an upright position  New onset, weakness or numbness of an extremity below the  injection site  Hives or difficulty breathing (go to the emergency room)  Inflammation or drainage at the injection site(s)  Severe back/neck pain greater than usual  New symptoms which are concerning to you  Please note:  Although the local anesthetic injected can often make your back or neck feel good for several hours after the injection, the pain will likely return. It takes 3-7 days for steroids to work.  You may not notice any pain relief for at least one week.  If effective, we will often do a series of 2-3 injections spaced 3-6 weeks apart to maximally decrease your pain.  After the initial series, you may be a candidate for a more permanent nerve block of the facets.  If you have any questions, please call #[phone]) [facility]

## 2016-12-27 NOTE — Patient Instructions (Addendum)
1. UDS today 2. Pain Psychology referral. Must complete this before I can start opioid therapy. You have prescriptions from her current provider until December. 3. Diagnostic bilateral lumbar facets with sedation earliest available.   DO NOT EAT OR DRINK FOR 8 HOURS PRIOR TO PROCEDURE BRING A DRIVER TAKE YOUR BLOOD PRESSURE MEDICATION THE MORNING OF YOUR PROCEDURE WITH A SMALL SIP OF WATER.  DO NOT GET THE FLU SHOT WITHIN 2 WEEKS OF A PROCEDURE, BEFORE OR AFTER  Facet Blocks Patient Information  Description: The facets are joints in the spine between the vertebrae.  Like any joints in the body, facets can become irritated and painful.  Arthritis can also effect the facets.  By injecting steroids and local anesthetic in and around these joints, we can temporarily block the nerve supply to them.  Steroids act directly on irritated nerves and tissues to reduce selling and inflammation which often leads to decreased pain.  Facet blocks may be done anywhere along the spine from the neck to the low back depending upon the location of your pain.   After numbing the skin with local anesthetic (like Novocaine), a small needle is passed onto the facet joints under x-ray guidance.  You may experience a sensation of pressure while this is being done.  The entire block usually lasts about 15-25 minutes.   Conditions which may be treated by facet blocks:   Low back/buttock pain  Neck/shoulder pain  Certain types of headaches  Preparation for the injection:  1. Do not eat any solid food or dairy products within 8 hours of your appointment. 2. You may drink clear liquid up to 3 hours before appointment.  Clear liquids include water, black coffee, juice or soda.  No milk or cream please. 3. You may take your regular medication, including pain medications, with a sip of water before your appointment.  Diabetics should hold regular insulin (if taken separately) and take 1/2 normal NPH dose the morning of  the procedure.  Carry some sugar containing items with you to your appointment. 4. A driver must accompany you and be prepared to drive you home after your procedure. 5. Bring all your current medications with you. 6. An IV may be inserted and sedation may be given at the discretion of the physician. 7. A blood pressure cuff, EKG and other monitors will often be applied during the procedure.  Some patients may need to have extra oxygen administered for a short period. 8. You will be asked to provide medical information, including your allergies and medications, prior to the procedure.  We must know immediately if you are taking blood thinners (like Coumadin/Warfarin) or if you are allergic to IV iodine contrast (dye).  We must know if you could possible be pregnant.  Possible side-effects:   Bleeding from needle site  Infection (rare, may require surgery)  Nerve injury (rare)  Numbness & tingling (temporary)  Difficulty urinating (rare, temporary)  Spinal headache (a headache worse with upright posture)  Light-headedness (temporary)  Pain at injection site (serveral days)  Decreased blood pressure (rare, temporary)  Weakness in arm/leg (temporary)  Pressure sensation in back/neck (temporary)   Call if you experience:   Fever/chills associated with headache or increased back/neck pain  Headache worsened by an upright position  New onset, weakness or numbness of an extremity below the injection site  Hives or difficulty breathing (go to the emergency room)  Inflammation or drainage at the injection site(s)  Severe back/neck pain greater than usual  New symptoms which are concerning to you  Please note:  Although the local anesthetic injected can often make your back or neck feel good for several hours after the injection, the pain will likely return. It takes 3-7 days for steroids to work.  You may not notice any pain relief for at least one week.  If effective, we  will often do a series of 2-3 injections spaced 3-6 weeks apart to maximally decrease your pain.  After the initial series, you may be a candidate for a more permanent nerve block of the facets.  If you have any questions, please call #[phone]) [facility]

## 2016-12-30 ENCOUNTER — Other Ambulatory Visit: Payer: Self-pay | Admitting: Internal Medicine

## 2016-12-31 LAB — COMPLIANCE DRUG ANALYSIS, UR

## 2017-01-04 ENCOUNTER — Ambulatory Visit
Admission: RE | Admit: 2017-01-04 | Discharge: 2017-01-04 | Disposition: A | Discharge status: Home / Self Care | Payer: Medicare Other | Source: Ambulatory Visit | Attending: Student in an Organized Health Care Education/Training Program | Admitting: Student in an Organized Health Care Education/Training Program

## 2017-01-04 ENCOUNTER — Ambulatory Visit (HOSPITAL_BASED_OUTPATIENT_CLINIC_OR_DEPARTMENT_OTHER): Payer: Medicare Other | Admitting: Student in an Organized Health Care Education/Training Program

## 2017-01-04 ENCOUNTER — Ambulatory Visit: Payer: Medicare Other | Admitting: Student in an Organized Health Care Education/Training Program

## 2017-01-04 ENCOUNTER — Encounter: Payer: Self-pay | Admitting: Student in an Organized Health Care Education/Training Program

## 2017-01-04 VITALS — BP 122/83 | HR 65 | Temp 97.7°F | Resp 14 | Ht 68.0 in | Wt 135.0 lb

## 2017-01-04 DIAGNOSIS — M5136 Other intervertebral disc degeneration, lumbar region: Secondary | ICD-10-CM

## 2017-01-04 DIAGNOSIS — M47816 Spondylosis without myelopathy or radiculopathy, lumbar region: Secondary | ICD-10-CM | POA: Insufficient documentation

## 2017-01-04 DIAGNOSIS — M48062 Spinal stenosis, lumbar region with neurogenic claudication: Secondary | ICD-10-CM

## 2017-01-04 DIAGNOSIS — G894 Chronic pain syndrome: Secondary | ICD-10-CM

## 2017-01-04 MED ORDER — LACTATED RINGERS IV SOLN
1000.0000 mL | Freq: Once | INTRAVENOUS | Status: AC
  Administered 2017-01-04: 1000 mL via INTRAVENOUS

## 2017-01-04 MED ORDER — LIDOCAINE HCL (PF) 1 % IJ SOLN
10.0000 mL | Freq: Once | INTRAMUSCULAR | Status: AC
  Administered 2017-01-04: 5 mL

## 2017-01-04 MED ORDER — FENTANYL CITRATE (PF) 100 MCG/2ML IJ SOLN
INTRAMUSCULAR | Status: AC
  Filled 2017-01-04: qty 2

## 2017-01-04 MED ORDER — ROPIVACAINE HCL 2 MG/ML IJ SOLN
10.0000 mL | Freq: Once | INTRAMUSCULAR | Status: AC
  Administered 2017-01-04: 10 mL

## 2017-01-04 MED ORDER — LIDOCAINE HCL (PF) 1 % IJ SOLN
INTRAMUSCULAR | Status: AC
  Filled 2017-01-04: qty 5

## 2017-01-04 MED ORDER — ROPIVACAINE HCL 2 MG/ML IJ SOLN
INTRAMUSCULAR | Status: AC
  Filled 2017-01-04: qty 10

## 2017-01-04 MED ORDER — FENTANYL CITRATE (PF) 100 MCG/2ML IJ SOLN
25.0000 ug | INTRAMUSCULAR | Status: DC | PRN
  Administered 2017-01-04: 25 ug via INTRAVENOUS

## 2017-01-04 NOTE — Progress Notes (Signed)
Safety precautions to be maintained throughout the outpatient stay will include: orient to surroundings, keep bed in low position, maintain call bell within reach at all times, provide assistance with transfer out of bed and ambulation.  

## 2017-01-04 NOTE — Patient Instructions (Signed)

## 2017-01-04 NOTE — Progress Notes (Signed)
Patient's Name: [name]  MRN: [id number]  Referring Provider: [name], MD  DOB: 11-Nov-1940  PCP: [name], MD  DOS: 01/04/2017  Note by: [name], MD  Service setting: Ambulatory outpatient  Specialty: Interventional Pain Management  Patient type: Established  Location: [facility] (AMB) Pain Management Facility  Visit type: Interventional Procedure   Primary Reason for Visit: Interventional Pain Management Treatment. CC: Back Pain (lower)  Procedure:  Anesthesia, Analgesia, Anxiolysis:  Type: Diagnostic Medial Branch Facet Block Region: Lumbar Level: L3, L4, L5,  Medial Branch Level(s) Laterality: Bilateral  Type: Local Anesthesia with Moderate (Conscious) Sedation Local Anesthetic: Lidocaine 1% Route: Intravenous (IV) IV Access: Secured Sedation: Meaningful verbal contact was maintained at all times during the procedure  Indication(s): Analgesia and Anxiety   Indications: 1. Lumbar spondylosis   2. Lumbar degenerative disc disease   3. Spinal stenosis, lumbar region, with neurogenic claudication   4. Lumbar facet arthropathy   5. Chronic pain syndrome    Pain Score: Pre-procedure: 5 /10 Post-procedure: 0-No pain/10  Pre-op Assessment:  [name] is a 76 y.o. (year old), female patient, seen today for interventional treatment. She  has a past surgical history that includes Esophagogastroduodenoscopy (N/A, 11/28/2013); Back surgery; Transurethral resection of bladder tumor (N/A, 12/13/2013); Transurethral resection of bladder tumor (N/A, 01/17/2014); Cataract extraction w/ intraocular lens  implant, bilateral (Bilateral, 12-2013 - 13-2440); Cystoscopy with retrograde pyelogram, ureteroscopy and stent placement (Bilateral, 04/18/2014); Transurethral resection of bladder tumor (N/A, 04/18/2014); Cardiac defibrillator placement (05/07/2014); Lumbar disc surgery (1980's); Cardiac catheterization (~ 02/2014); bi-ventricular implantable cardioverter defibrillator (N/A, 05/07/2014);  Cystoscopy w/ retrogrades (Bilateral, 01/06/2015); and Cystoscopy with biopsy (N/A, 10/06/2015). [name] has a current medication list which includes the following prescription(s): amlodipine, cyclobenzaprine, gabapentin, hydrocodone-acetaminophen, lisinopril, metoprolol succinate, polyethylene glycol, promethazine, spironolactone, sucralfate, duloxetine, lisinopril, pantoprazole, and spironolactone, and the following Facility-Administered Medications: fentanyl. Her primarily concern today is the Back Pain (lower)  Initial Vital Signs: There were no vitals taken for this visit. BMI: Estimated body mass index is 20.53 kg/m as calculated from the following:   Height as of this encounter: 5\' 8"  (1.727 m).   Weight as of this encounter: 135 lb (61.2 kg).  Risk Assessment: Allergies: Reviewed. She has No Known Allergies.  Allergy Precautions: None required Coagulopathies: Reviewed. None identified.  Blood-thinner therapy: None at this time Active Infection(s): Reviewed. None identified. [name] is afebrile  Site Confirmation: [name] was asked to confirm the procedure and laterality before marking the site Procedure checklist: Completed Consent: Before the procedure and under the influence of no sedative(s), amnesic(s), or anxiolytics, the patient was informed of the treatment options, risks and possible complications. To fulfill our ethical and legal obligations, as recommended by the American Medical Association's Code of Ethics, I have informed the patient of my clinical impression; the nature and purpose of the treatment or procedure; the risks, benefits, and possible complications of the intervention; the alternatives, including doing nothing; the risk(s) and benefit(s) of the alternative treatment(s) or procedure(s); and the risk(s) and benefit(s) of doing nothing. The patient was provided information about the general risks and possible complications associated with the procedure. These may  include, but are not limited to: failure to achieve desired goals, infection, bleeding, organ or nerve damage, allergic reactions, paralysis, and death. In addition, the patient was informed of those risks and complications associated to Spine-related procedures, such as failure to decrease pain; infection (i.e.: Meningitis, epidural or intraspinal abscess); bleeding (i.e.: epidural hematoma, subarachnoid hemorrhage, or  any other type of intraspinal or peri-dural bleeding); organ or nerve damage (i.e.: Any type of peripheral nerve, nerve root, or spinal cord injury) with subsequent damage to sensory, motor, and/or autonomic systems, resulting in permanent pain, numbness, and/or weakness of one or several areas of the body; allergic reactions; (i.e.: anaphylactic reaction); and/or death. Furthermore, the patient was informed of those risks and complications associated with the medications. These include, but are not limited to: allergic reactions (i.e.: anaphylactic or anaphylactoid reaction(s)); adrenal axis suppression; blood sugar elevation that in diabetics may result in ketoacidosis or comma; water retention that in patients with history of congestive heart failure may result in shortness of breath, pulmonary edema, and decompensation with resultant heart failure; weight gain; swelling or edema; medication-induced neural toxicity; particulate matter embolism and blood vessel occlusion with resultant organ, and/or nervous system infarction; and/or aseptic necrosis of one or more joints. Finally, the patient was informed that Medicine is not an exact science; therefore, there is also the possibility of unforeseen or unpredictable risks and/or possible complications that may result in a catastrophic outcome. The patient indicated having understood very clearly. We have given the patient no guarantees and we have made no promises. Enough time was given to the patient to ask questions, all of which were answered  to the patient's satisfaction. [name] has indicated that she wanted to continue with the procedure. Attestation: I, the ordering provider, attest that I have discussed with the patient the benefits, risks, side-effects, alternatives, likelihood of achieving goals, and potential problems during recovery for the procedure that I have provided informed consent. Date: 01/04/2017; Time: 11:56 AM  Pre-Procedure Preparation:  Monitoring: As per clinic protocol. Respiration, ETCO2, SpO2, BP, heart rate and rhythm monitor placed and checked for adequate function Safety Precautions: Patient was assessed for positional comfort and pressure points before starting the procedure. Time-out: I initiated and conducted the "Time-out" before starting the procedure, as per protocol. The patient was asked to participate by confirming the accuracy of the "Time Out" information. Verification of the correct person, site, and procedure were performed and confirmed by me, the nursing staff, and the patient. "Time-out" conducted as per Joint Commission's Universal Protocol (UP.01.01.01). "Time-out" Date & Time: 01/04/2017; 1235 hrs.  Description of Procedure Process:   Position: Prone Target Area: For Lumbar Facet blocks, the target is the groove formed by the junction of the transverse process and superior articular process. For the L5 dorsal ramus, the target is the notch between superior articular process and sacral ala.  Approach: Paramedial approach. Area Prepped: Entire Posterior Lumbosacral Region Prepping solution: ChloraPrep (2% chlorhexidine gluconate and 70% isopropyl alcohol) Safety Precautions: Aspiration looking for blood return was conducted prior to all injections. At no point did we inject any substances, as a needle was being advanced. No attempts were made at seeking any paresthesias. Safe injection practices and needle disposal techniques used. Medications properly checked for expiration dates. SDV  (single dose vial) medications used. Description of the Procedure: Protocol guidelines were followed. The patient was placed in position over the fluoroscopy table. The target area was identified and the area prepped in the usual manner. Skin desensitized using vapocoolant spray. Skin & deeper tissues infiltrated with local anesthetic. Appropriate amount of time allowed to pass for local anesthetics to take effect. The procedure needle was introduced through the skin, ipsilateral to the reported pain, and advanced to the target area. Employing the "Medial Branch Technique", the needles were advanced to the angle made by the superior  and medial portion of the transverse process, and the lateral and inferior portion of the superior articulating process of the targeted vertebral bodies. This area is known as "Burton's Eye" or the "Eye of the [place] Dog". A procedure needle was introduced through the skin, and this time advanced to the angle made by the superior and medial border of the sacral ala, and the lateral border of the S1 vertebral body. Negative aspiration confirmed. Solution injected in intermittent fashion, asking for systemic symptoms every 0.5cc of injectate. The needles were then removed and the area cleansed, making sure to leave some of the prepping solution back to take advantage of its long term bactericidal properties.   Illustration of the posterior view of the lumbar spine and the posterior neural structures. Laminae of L2 through S1 are labeled. DPRL5, dorsal primary ramus of L5; DPRS1, dorsal primary ramus of S1; DPR3, dorsal primary ramus of L3; FJ, facet (zygapophyseal) joint L3-L4; I, inferior articular process of L4; LB1, lateral branch of dorsal primary ramus of L1; IAB, inferior articular branches from L3 medial branch (supplies L4-L5 facet joint); IBP, intermediate branch plexus; MB3, medial branch of dorsal primary ramus of L3; NR3, third lumbar nerve root; S, superior articular  process of L5; SAB, superior articular branches from L4 (supplies L4-5 facet joint also); TP3, transverse process of L3.  Vitals:   01/04/17 1247 01/04/17 1250 01/04/17 1300 01/04/17 1310  BP: 112/72 108/80 (!) 125/54 122/83  Pulse: 69 71 69 65  Resp: 11 11 12 14   Temp:      TempSrc:      SpO2: 99% 99% 99% 97%  Weight:      Height:        Start Time: 1236 hrs. End Time:   hrs. Materials:  Needle(s) Type: Regular needle Gauge: 22G Length: 3.5-in Medication(s): We administered fentaNYL, lactated ringers, lidocaine (PF), and ropivacaine (PF) 2 mg/mL (0.2%). Please see chart orders for dosing details. 1.5 cc of ropivacaine at each level. Imaging Guidance (Spinal):  Type of Imaging Technique: Fluoroscopy Guidance (Spinal) Indication(s): Assistance in needle guidance and placement for procedures requiring needle placement in or near specific anatomical locations not easily accessible without such assistance. Exposure Time: Please see nurses notes. Contrast: None used. Fluoroscopic Guidance: I was personally present during the use of fluoroscopy. "Tunnel Vision Technique" used to obtain the best possible view of the target area. Parallax error corrected before commencing the procedure. "Direction-depth-direction" technique used to introduce the needle under continuous pulsed fluoroscopy. Once target was reached, antero-posterior, oblique, and lateral fluoroscopic projection used confirm needle placement in all planes. Images permanently stored in EMR. Interpretation: No contrast injected. I personally interpreted the imaging intraoperatively. Adequate needle placement confirmed in multiple planes. Permanent images saved into the patient's record.  Antibiotic Prophylaxis:  Indication(s): None identified Antibiotic given: None  Post-operative Assessment:  EBL: None Complications: No immediate post-treatment complications observed by team, or reported by patient. Note: The patient tolerated  the entire procedure well. A repeat set of vitals were taken after the procedure and the patient was kept under observation following institutional policy, for this type of procedure. Post-procedural neurological assessment was performed, showing return to baseline, prior to discharge. The patient was provided with post-procedure discharge instructions, including a section on how to identify potential problems. Should any problems arise concerning this procedure, the patient was given instructions to immediately contact us, at any time, without hesitation. In any case, we plan to contact the patient by telephone for a follow-up  status report regarding this interventional procedure. Comments:  No additional relevant information.  Plan of Care  5 out of 5 strength bilateral lower extremity: Plantar flexion, dorsiflexion, knee flexion, knee extension.  Imaging Orders     DG C-Arm 1-60 Min-No Report Procedure Orders    No procedure(s) ordered today    Medications ordered for procedure: Meds ordered this encounter  Medications  . fentaNYL (SUBLIMAZE) injection 25-50 mcg    Make sure Narcan is available in the pyxis when using this medication. In the event of respiratory depression (RR< 8/min): Titrate NARCAN (naloxone) in increments of 0.1 to 0.2 mg IV at 2-3 minute intervals, until desired degree of reversal.  . lactated ringers infusion 1,000 mL  . lidocaine (PF) (XYLOCAINE) 1 % injection 10 mL  . ropivacaine (PF) 2 mg/mL (0.2%) (NAROPIN) injection 10 mL   Medications administered: We administered fentaNYL, lactated ringers, lidocaine (PF), and ropivacaine (PF) 2 mg/mL (0.2%).  See the medical record for exact dosing, route, and time of administration.  New Prescriptions   No medications on file   Disposition: Discharge home  Discharge Date & Time: 01/04/2017;   hrs.   Physician-requested Follow-up: Return in about 2 weeks (around 01/18/2017) for Post Procedure Evaluation. Future  Appointments Date Time Provider [place]  01/17/2017 11:30 AM [name], MD [facility] None  02/07/2017 11:20 AM Wellington Hampshire, MD [facility]  03/22/2017 11:45 AM Deboraha Sprang, MD [facility]  03/24/2017 1:30 PM [name], MD [facility] None  05/16/2017 3:00 PM O'Brien-Blaney, Bryson Corona, LPN [facility] None   Primary Care Physician: [name], MD Location: [facility] Note by: [name], MD Date: 01/04/2017; Time: 1:39 PM  Disclaimer:  Medicine is not an exact science. The only guarantee in medicine is that nothing is guaranteed. It is important to note that the decision to proceed with this intervention was based on the information collected from the patient. The Data and conclusions were drawn from the patient's questionnaire, the interview, and the physical examination. Because the information was provided in large part by the patient, it cannot be guaranteed that it has not been purposely or unconsciously manipulated. Every effort has been made to obtain as much relevant data as possible for this evaluation. It is important to note that the conclusions that lead to this procedure are derived in large part from the available data. Always take into account that the treatment will also be dependent on availability of resources and existing treatment guidelines, considered by other Pain Management Practitioners as being common knowledge and practice, at the time of the intervention. For Medico-Legal purposes, it is also important to point out that variation in procedural techniques and pharmacological choices are the acceptable norm. The indications, contraindications, technique, and results of the above procedure should only be interpreted and judged by a Board-Certified Interventional Pain Specialist with extensive familiarity and expertise in the same exact procedure and technique.

## 2017-01-05 ENCOUNTER — Telehealth: Payer: Self-pay | Admitting: *Deleted

## 2017-01-05 NOTE — Telephone Encounter (Signed)
No problems post procedure. 

## 2017-01-13 ENCOUNTER — Other Ambulatory Visit: Payer: Self-pay | Admitting: Internal Medicine

## 2017-01-17 ENCOUNTER — Ambulatory Visit
Payer: Medicare Other | Attending: Student in an Organized Health Care Education/Training Program | Admitting: Student in an Organized Health Care Education/Training Program

## 2017-01-17 ENCOUNTER — Encounter: Payer: Self-pay | Admitting: Student in an Organized Health Care Education/Training Program

## 2017-01-17 VITALS — BP 141/73 | HR 85 | Temp 98.0°F | Resp 18 | Ht 68.0 in | Wt 135.0 lb

## 2017-01-17 DIAGNOSIS — M4698 Unspecified inflammatory spondylopathy, sacral and sacrococcygeal region: Secondary | ICD-10-CM | POA: Diagnosis not present

## 2017-01-17 DIAGNOSIS — M47816 Spondylosis without myelopathy or radiculopathy, lumbar region: Secondary | ICD-10-CM | POA: Diagnosis not present

## 2017-01-17 DIAGNOSIS — Z8551 Personal history of malignant neoplasm of bladder: Secondary | ICD-10-CM | POA: Insufficient documentation

## 2017-01-17 DIAGNOSIS — I447 Left bundle-branch block, unspecified: Secondary | ICD-10-CM | POA: Diagnosis not present

## 2017-01-17 DIAGNOSIS — J449 Chronic obstructive pulmonary disease, unspecified: Secondary | ICD-10-CM | POA: Diagnosis not present

## 2017-01-17 DIAGNOSIS — G894 Chronic pain syndrome: Secondary | ICD-10-CM | POA: Diagnosis not present

## 2017-01-17 DIAGNOSIS — G43909 Migraine, unspecified, not intractable, without status migrainosus: Secondary | ICD-10-CM | POA: Insufficient documentation

## 2017-01-17 DIAGNOSIS — Z95810 Presence of automatic (implantable) cardiac defibrillator: Secondary | ICD-10-CM | POA: Insufficient documentation

## 2017-01-17 DIAGNOSIS — Z9889 Other specified postprocedural states: Secondary | ICD-10-CM | POA: Insufficient documentation

## 2017-01-17 DIAGNOSIS — I110 Hypertensive heart disease with heart failure: Secondary | ICD-10-CM | POA: Diagnosis not present

## 2017-01-17 DIAGNOSIS — Z79899 Other long term (current) drug therapy: Secondary | ICD-10-CM | POA: Diagnosis not present

## 2017-01-17 DIAGNOSIS — Z9842 Cataract extraction status, left eye: Secondary | ICD-10-CM | POA: Diagnosis not present

## 2017-01-17 DIAGNOSIS — F17200 Nicotine dependence, unspecified, uncomplicated: Secondary | ICD-10-CM | POA: Insufficient documentation

## 2017-01-17 DIAGNOSIS — I509 Heart failure, unspecified: Secondary | ICD-10-CM | POA: Insufficient documentation

## 2017-01-17 DIAGNOSIS — M5136 Other intervertebral disc degeneration, lumbar region: Secondary | ICD-10-CM | POA: Insufficient documentation

## 2017-01-17 DIAGNOSIS — Z960 Presence of urogenital implants: Secondary | ICD-10-CM | POA: Diagnosis not present

## 2017-01-17 DIAGNOSIS — Z8719 Personal history of other diseases of the digestive system: Secondary | ICD-10-CM | POA: Diagnosis not present

## 2017-01-17 DIAGNOSIS — Z9841 Cataract extraction status, right eye: Secondary | ICD-10-CM | POA: Diagnosis not present

## 2017-01-17 DIAGNOSIS — I491 Atrial premature depolarization: Secondary | ICD-10-CM | POA: Diagnosis not present

## 2017-01-17 DIAGNOSIS — Z809 Family history of malignant neoplasm, unspecified: Secondary | ICD-10-CM | POA: Insufficient documentation

## 2017-01-17 DIAGNOSIS — K219 Gastro-esophageal reflux disease without esophagitis: Secondary | ICD-10-CM | POA: Insufficient documentation

## 2017-01-17 DIAGNOSIS — M47818 Spondylosis without myelopathy or radiculopathy, sacral and sacrococcygeal region: Secondary | ICD-10-CM

## 2017-01-17 NOTE — Progress Notes (Signed)
Patient's Name: [name]  MRN: [id number]  Referring Provider: [name], MD  DOB: [birth year]  PCP: [name], MD  DOS: 01/17/2017  Note by: [name], MD  Service setting: Ambulatory outpatient  Specialty: Interventional Pain Management  Location: [facility] (AMB) Pain Management Facility    Patient type: Established   Primary Reason(s) for Visit: Encounter for post-procedure evaluation of chronic illness with mild to moderate exacerbation CC: Back Pain (low)  HPI  [name] is a 76 y.o. year old, female patient, who comes today for a post-procedure evaluation. She has Palpitations; Hyponatremia; Bladder cancer ([place]); LBBB (left bundle branch block); Atrial ectopy; Congestive dilated cardiomyopathy ([place]); History of gastric ulcer; Spinal stenosis of thoracolumbar region; COPD (chronic obstructive pulmonary disease) ([place]); Constipation; Spinal stenosis in cervical region; Chronic bilateral low back pain; NSAID induced gastritis; Parkinsonian features; and Hypertension on her problem list. Her primarily concern today is the Back Pain (low)  Pain Assessment: Location: Lower Back Radiating: radiates into buttocks and back of thighs Onset: More than a month ago Duration: Chronic pain Quality: Aching Severity: 6 /10 (self-reported pain score)  Note: Reported level is inconsistent with clinical observations. Clinically the patient looks like a 77/10 A 2/10 is viewed as "Mild to Moderate" and described as noticeable and distracting. Impossible to hide from other people. More frequent flare-ups. Still possible to adapt and function close to normal. It can be very annoying and may have occasional stronger flare-ups. With discipline, patients may get used to it and adapt.       When using our objective Pain Scale, levels between 6 and 10/10 are said to belong in an emergency room, as it progressively worsens from a 6/10, described as severely limiting, requiring emergency care not usually  available at an outpatient pain management facility. At a 6/10 level, communication becomes difficult and requires great effort. Assistance to reach the emergency department may be required. Facial flushing and profuse sweating along with potentially dangerous increases in heart rate and blood pressure will be evident. Effect on ADL:   Timing: Constant Modifying factors:    Ms. Wist comes in today for post-procedure evaluation after the treatment done on 01/04/2017.  Further details on both, my assessment(s), as well as the proposed treatment plan, please see below.  Post-Procedure Assessment  01/04/2017 Procedure: Bilateral L3-L5 lumbar facet blocks Pre-procedure pain score:  5/10 Post-procedure pain score: 0/10         Influential Factors: BMI: 20.53 kg/m Intra-procedural challenges: None observed.         Assessment challenges: None detected.              Reported side-effects: None.        Post-procedural adverse reactions or complications: None reported         Sedation: Please see nurses note. When no sedatives are used, the analgesic levels obtained are directly associated to the effectiveness of the local anesthetics. However, when sedation is provided, the level of analgesia obtained during the initial 1 hour following the intervention, is believed to be the result of a combination of factors. These factors may include, but are not limited to: 1. The effectiveness of the local anesthetics used. 2. The effects of the analgesic(s) and/or anxiolytic(s) used. 3. The degree of discomfort experienced by the patient at the time of the procedure. 4. The patients ability and reliability in recalling and recording the events. 5. The presence and influence of possible secondary gains and/or psychosocial factors. Reported result:  Relief experienced during the 1st hour after the procedure: 100 % (Ultra-Short Term Relief)            Interpretative annotation: Clinically appropriate result.  Analgesia during this period is likely to be Local Anesthetic and/or IV Sedative (Analgesic/Anxiolytic) related.          Effects of local anesthetic: The analgesic effects attained during this period are directly associated to the localized infiltration of local anesthetics and therefore cary significant diagnostic value as to the etiological location, or anatomical origin, of the pain. Expected duration of relief is directly dependent on the pharmacodynamics of the local anesthetic used. Long-acting (4-6 hours) anesthetics used.  Reported result: Relief during the next 4 to 6 hour after the procedure: 100 % (Short-Term Relief)            Interpretative annotation: Clinically appropriate result. Analgesia during this period is likely to be Local Anesthetic-related.          Long-term benefit: Defined as the period of time past the expected duration of local anesthetics (1 hour for short-acting and 4-6 hours for long-acting). With the possible exception of prolonged sympathetic blockade from the local anesthetics, benefits during this period are typically attributed to, or associated with, other factors such as analgesic sensory neuropraxia, antiinflammatory effects, or beneficial biochemical changes provided by agents other than the local anesthetics.  Reported result: Extended relief following procedure: 50% for 1-2 days (Long-Term Relief)            Interpretative annotation: Clinically appropriate result. Good relief. No permanent benefit expected. Limited inflammation. Possible mechanical aggravating factors.          Current benefits: Defined as reported results that persistent at this point in time.   Analgesia: 0-25 %            Function: Somewhat improved ROM: Somewhat improved Interpretative annotation: Recurrence of symptoms. No permanent benefit expected. Effective diagnostic intervention.          Interpretation: Results would suggest a successful diagnostic intervention. We'll proceed  with diagnostic intervention #2, as soon as convenient          Plan:  Please see "Plan of Care" for details.        Laboratory Chemistry  Inflammation Markers (CRP: Acute Phase) (ESR: Chronic Phase) No results found for: CRP, ESRSEDRATE               Renal Function Markers Lab Results  Component Value Date   BUN 10 11/02/2016   CREATININE 0.69 11/02/2016   GFRAA >60 10/02/2015   GFRNONAA >60 10/02/2015                 Hepatic Function Markers Lab Results  Component Value Date   AST 23 05/24/2016   ALT 15 05/24/2016   ALBUMIN 4.7 05/24/2016   ALKPHOS 95 05/24/2016                 Electrolytes Lab Results  Component Value Date   NA 133 (L) 11/02/2016   K 4.2 11/02/2016   CL 98 11/02/2016   CALCIUM 10.1 11/02/2016                 Neuropathy Markers Lab Results  Component Value Date   VITAMINB12 1,069 (H) 12/27/2013                 Bone Pathology Markers Lab Results  Component Value Date   ALKPHOS 95 05/24/2016   CALCIUM 10.1 11/02/2016  Coagulation Parameters Lab Results  Component Value Date   INR 1.0 04/28/2014   LABPROT 10.3 04/28/2014   APTT 27 11/22/2013   PLT 209 10/02/2015                 Cardiovascular Markers Lab Results  Component Value Date   HGB 13.5 10/02/2015   HCT 40.4 10/02/2015                 Note: Lab results reviewed.  Recent Diagnostic Imaging Results  DG C-Arm 1-60 Min-No Report Fluoroscopy was utilized by the requesting physician.  No radiographic  interpretation.   Complexity Note: Imaging results reviewed. Results shared with Ms. Manfre, using Layman's terms.                         Meds   Current Outpatient Prescriptions:  .  amLODipine (NORVASC) 10 MG tablet, TAKE 1/2 TABLET(5MG) BY MOUTH TWICE DAILY**NOTE DOSE**, Disp: 30 tablet, Rfl: 0 .  cyclobenzaprine (FLEXERIL) 5 MG tablet, TAKE 1 TABLET(5 MG) BY MOUTH THREE TIMES DAILY AS NEEDED FOR MUSCLE SPASMS, Disp: 90 tablet, Rfl: 0 .  DULoxetine  (CYMBALTA) 30 MG capsule, TAKE 1 CAPSULE(30 MG) BY MOUTH DAILY, Disp: 30 capsule, Rfl: 0 .  gabapentin (NEURONTIN) 300 MG capsule, TAKE 1 CAPSULE BY MOUTH TWICE DAILY EVERY NIGHT AT BEDTIME AS DIRECTED, Disp: 60 capsule, Rfl: 0 .  HYDROcodone-acetaminophen (NORCO) 10-325 MG tablet, Take 1 tablet by mouth every 4 (four) hours as needed for severe pain (Pain)., Disp: 180 tablet, Rfl: 0 .  lisinopril (PRINIVIL,ZESTRIL) 40 MG tablet, TAKE 1 TABLET(40 MG) BY MOUTH DAILY, Disp: 30 tablet, Rfl: 0 .  lisinopril (PRINIVIL,ZESTRIL) 40 MG tablet, TAKE 1 TABLET(40 MG) BY MOUTH DAILY, Disp: 30 tablet, Rfl: 0 .  metoprolol succinate (TOPROL-XL) 25 MG 24 hr tablet, Take 1 tablet (25 mg total) by mouth daily., Disp: 30 tablet, Rfl: 3 .  pantoprazole (PROTONIX) 40 MG tablet, TAKE 1 TABLET(40 MG) BY MOUTH TWICE DAILY, Disp: 60 tablet, Rfl: 0 .  polyethylene glycol (MIRALAX / GLYCOLAX) packet, Take 17 g by mouth daily as needed for mild constipation. , Disp: , Rfl:  .  promethazine (PHENERGAN) 12.5 MG tablet, TAKE 1 TABLET BY MOUTH EVERY 8 HOURS AS NEEDED FOR NAUSEA OR VOMITING, Disp: 60 tablet, Rfl: 0 .  spironolactone (ALDACTONE) 25 MG tablet, TAKE 1 TABLET BY MOUTH DAILY, Disp: 30 tablet, Rfl: 5 .  sucralfate (CARAFATE) 1 g tablet, TAKE 1 TABLET BY MOUTH FOUR TIMES DAILY EVERY NIGHT AT BEDTIME WITH MEALS, Disp: 120 tablet, Rfl: 0 .  spironolactone (ALDACTONE) 25 MG tablet, TK 1 T PO D, Disp: , Rfl: 1  ROS  Constitutional: Denies any fever or chills Gastrointestinal: No reported hemesis, hematochezia, vomiting, or acute GI distress Musculoskeletal: Denies any acute onset joint swelling, redness, loss of ROM, or weakness Neurological: No reported episodes of acute onset apraxia, aphasia, dysarthria, agnosia, amnesia, paralysis, loss of coordination, or loss of consciousness  Allergies  Ms. Leaton has No Known Allergies.  [place]  Drug: Ms. Goering  reports that she does not use drugs. Alcohol:  reports that she does  not drink alcohol. Tobacco:  reports that she has been smoking Cigarettes.  She has a 27.50 pack-year smoking history. She has never used smokeless tobacco. Medical:  has a past medical history of AICD (automatic cardioverter/defibrillator) present; Anemia; Arthritis; Bladder cancer ([place]) (1995; 2015); CHF (congestive heart failure) ([place]); Chronic lower back pain; COPD (chronic obstructive  pulmonary disease) ([place]); Ectopic atrial beats; Frequent urination; GERD (gastroesophageal reflux disease); History of stomach ulcers (11/2013); Hypertension; LBBB (left bundle branch block); and Migraines. Surgical: Ms. Isakson  has a past surgical history that includes Esophagogastroduodenoscopy (N/A, 11/28/2013); Back surgery; Transurethral resection of bladder tumor (N/A, 12/13/2013); Transurethral resection of bladder tumor (N/A, 01/17/2014); Cataract extraction w/ intraocular lens  implant, bilateral (Bilateral, 12-2013 - [id number]); Cystoscopy with retrograde pyelogram, ureteroscopy and stent placement (Bilateral, 04/18/2014); Transurethral resection of bladder tumor (N/A, 04/18/2014); Cardiac defibrillator placement (05/07/2014); Lumbar disc surgery (1980's); Cardiac catheterization (~ 02/2014); bi-ventricular implantable cardioverter defibrillator (N/A, 05/07/2014); Cystoscopy w/ retrogrades (Bilateral, 01/06/2015); and Cystoscopy with biopsy (N/A, 10/06/2015). Family: family history includes Cancer in her brother and mother.  Constitutional Exam  General appearance: Well nourished, well developed, and well hydrated. In no apparent acute distress Vitals:   01/17/17 1336  BP: (!) 141/73  Pulse: 85  Resp: 18  Temp: 98 F (36.7 C)  SpO2: 99%  Weight: 135 lb (61.2 kg)  Height: 5' 8"  (1.727 m)   BMI Assessment: Estimated body mass index is 20.53 kg/m as calculated from the following:   Height as of this encounter: 5' 8"  (1.727 m).   Weight as of this encounter: 135 lb (61.2 kg).  BMI interpretation table: BMI level  Category Range association with higher incidence of chronic pain  <18 kg/m2 Underweight   18.5-24.9 kg/m2 Ideal body weight   25-29.9 kg/m2 Overweight Increased incidence by 20%  30-34.9 kg/m2 Obese (Class I) Increased incidence by 68%  35-39.9 kg/m2 Severe obesity (Class II) Increased incidence by 136%  >40 kg/m2 Extreme obesity (Class III) Increased incidence by 254%   BMI Readings from Last 4 Encounters:  01/17/17 20.53 kg/m  01/04/17 20.53 kg/m  12/27/16 20.53 kg/m  12/14/16 20.53 kg/m   Wt Readings from Last 4 Encounters:  01/17/17 135 lb (61.2 kg)  01/04/17 135 lb (61.2 kg)  12/27/16 135 lb (61.2 kg)  12/14/16 135 lb (61.2 kg)  Psych/Mental status: Alert, oriented x 3 (person, place, & time)       Eyes: PERLA Respiratory: No evidence of acute respiratory distress  Cervical Spine Area Exam  Skin & Axial Inspection: No masses, redness, edema, swelling, or associated skin lesions Alignment: Symmetrical Functional ROM: Unrestricted ROM      Stability: No instability detected Muscle Tone/Strength: Functionally intact. No obvious neuro-muscular anomalies detected. Sensory (Neurological): Unimpaired Palpation: No palpable anomalies              Upper Extremity (UE) Exam    Side: Right upper extremity  Side: Left upper extremity  Skin & Extremity Inspection: Skin color, temperature, and hair growth are WNL. No peripheral edema or cyanosis. No masses, redness, swelling, asymmetry, or associated skin lesions. No contractures.  Skin & Extremity Inspection: Skin color, temperature, and hair growth are WNL. No peripheral edema or cyanosis. No masses, redness, swelling, asymmetry, or associated skin lesions. No contractures.  Functional ROM: Unrestricted ROM          Functional ROM: Unrestricted ROM          Muscle Tone/Strength: Functionally intact. No obvious neuro-muscular anomalies detected.  Muscle Tone/Strength: Functionally intact. No obvious neuro-muscular anomalies detected.   Sensory (Neurological): Unimpaired          Sensory (Neurological): Unimpaired          Palpation: No palpable anomalies              Palpation: No palpable anomalies  Specialized Test(s): Deferred         Specialized Test(s): Deferred          Thoracic Spine Area Exam  Skin & Axial Inspection: No masses, redness, or swelling Alignment: Symmetrical Functional ROM: Unrestricted ROM Stability: No instability detected Muscle Tone/Strength: Functionally intact. No obvious neuro-muscular anomalies detected. Sensory (Neurological): Unimpaired Muscle strength & Tone: No palpable anomalies  Lumbar Spine Area Exam  Skin & Axial Inspection: No masses, redness, or swelling Alignment: Symmetrical Functional ROM: Unrestricted ROM      Stability: No instability detected Muscle Tone/Strength: Functionally intact. No obvious neuro-muscular anomalies detected. Sensory (Neurological): Unimpaired Palpation: No palpable anomalies       Provocative Tests: Lumbar Hyperextension and rotation test: Improved after treatment for approximately 2 days       Lumbar Lateral bending test: Positive       Patrick's Maneuver: Positive for bilateral S-I arthralgia              Gait & Posture Assessment  Ambulation: Unassisted Gait: Relatively normal for age and body habitus Posture: WNL   Lower Extremity Exam    Side: Right lower extremity  Side: Left lower extremity  Skin & Extremity Inspection: Skin color, temperature, and hair growth are WNL. No peripheral edema or cyanosis. No masses, redness, swelling, asymmetry, or associated skin lesions. No contractures.  Skin & Extremity Inspection: Skin color, temperature, and hair growth are WNL. No peripheral edema or cyanosis. No masses, redness, swelling, asymmetry, or associated skin lesions. No contractures.  Functional ROM: Unrestricted ROM          Functional ROM: Unrestricted ROM          Muscle Tone/Strength: Functionally intact. No obvious  neuro-muscular anomalies detected.  Muscle Tone/Strength: Functionally intact. No obvious neuro-muscular anomalies detected.  Sensory (Neurological): Unimpaired  Sensory (Neurological): Unimpaired  Palpation: No palpable anomalies  Palpation: No palpable anomalies   Assessment  Primary Diagnosis & Pertinent Problem List: The primary encounter diagnosis was Lumbar spondylosis. 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  Status Diagnosis  Responding Stable Having a Flare-up 1. Lumbar spondylosis   2. Lumbar degenerative disc disease   3. 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 Chronic pain syndrome       General Recommendations: The pain condition that the patient suffers from is best treated with a multidisciplinary approach that involves an increase in physical activity to prevent de-conditioning and worsening of the pain cycle, as well as psychological counseling (formal and/or informal) to address the co-morbid psychological affects of pain. Treatment will often involve judicious use of pain medications and interventional procedures to decrease the pain, allowing the patient to participate in the physical activity that will ultimately produce long-lasting pain reductions. The goal of the multidisciplinary approach is to return the patient to a higher level of overall function and to restore their ability to perform activities of daily living.  76 year old female who presents with axial bilateral low back pain that radiates into posterior thighs, buttocks and occasionally into bilateral legs. Patient has difficulty ambulating or standing erect for long periods of time. Endorses trembling of her legs and weakness that requires her to sit down. No loss of bowel or bladder function. Patient has been evaluated by neurosurgery and she was not deemed a surgical candidate. For the patient's pain, she is on Flexeril 5 mg 3  times a day when necessary, gabapentin 300 mg twice  a day, Norco 10 mg 6 times a day when necessary (quantity 180 month).  Patient's axial low back pain with radiation to bilateral buttocks and legs secondary to lumbar spinal stenosis with neurogenic claudication along with lumbar spondylosis, lumbar facet hypertrophy and arthropathy. Patient has significant pain with lumbar extension and palpation overlying lumbar facets. Patient status post diagnostic lumbar facet blocks on 01/04/2017 with improvement in axial low back and buttock pain for approximately 36 hours after the block.  Patient endorses pain in her hip and intermittent groin pain. She has a positive Patrick's bilaterally. She has pain overlying her SI joints. Plan to repeat diagnostic lumbar facet block #2 along with bilateral sacroiliac joint injections.  Plan: -Diagnostic lumbar facet block #2, bilateral, L3/4, L4/5, L5/S1 -Bilateral sacroiliac joint injection -Continue all medications as prescribed. We'll take over the patient's opioid therapy in mid December.   Orders Placed This Encounter  Procedures  . LUMBAR FACET(MEDIAL BRANCH NERVE BLOCK) MBNB  . SACROILIAC JOINT INJECTINS    Pharmacological management options:  Opioid Analgesics:  in December we'll take over patient's hydrocodone at current dose of 10 mg up to 6 times a day, quantity 180 a month.  Membrane stabilizer: currently on gabapentin 300 mg twice daily  Muscle relaxant: Flexeril 5 mg 3 times a day when necessary. Can consider Robaxin, baclofen, tizanidine in the future.  NSAID: To be determined at a later time  Other analgesic(s): consider topical therapies and creams including compounded cream, TENS unit.      Interventional management options: Planned, scheduled, and/or pending:    -Diagnostic bilateral lumbar facet block #2 -Bilateral sacroiliac joint injection    Considering:   caudal epidural steroid injection   PRN Procedures:   To be  determined at a later time   Provider-requested follow-up: Return for Procedure.  Future Appointments Date Time Provider [place]  02/07/2017 11:20 AM Wellington Hampshire, MD [facility]  03/22/2017 11:45 AM Deboraha Sprang, MD [facility]  03/24/2017 1:30 PM [name], MD [facility] None  05/16/2017 3:00 PM O'Brien-Blaney, Bryson Corona, LPN [facility] None    Primary Care Physician: [name], MD Location[facility] Out[facility] Note by: [name], M.D Date: 01/17/2017; Time: 3:06 PM  Patient Instructions  1. Repeat lumbar facet diagnostic block #2 along with bilateral SI joint injection   Preparing for your procedure (without sedation) Instructions: . Oral Intake: Do not eat or drink anything for at least 3 hours prior to your procedure. . Transportation: Unless otherwise stated by your physician, you may drive yourself after the procedure. . Blood Pressure Medicine: Take your blood pressure medicine with a sip of water the morning of the procedure. . Insulin: Take only  of your normal insulin dose. . Preventing infections: Shower with an antibacterial soap the morning of your procedure. . Build-up your immune system: Take 1000 mg of Vitamin C with every meal (3 times a day) the day prior to your procedure. . Pregnancy: If you are pregnant, call and cancel the procedure. . Sickness: If you have a cold, fever, or any active infections, call and cancel the procedure. . Arrival: You must be in the facility at least 30 minutes prior to your scheduled procedure. . Children: Do not bring any children with you. . Dress appropriately: Bring dark clothing that you would not mind if they get stained. . Valuables: Do not bring any jewelry or valuables. Procedure appointments are reserved for interventional treatments only. Marland Kitchen No Prescription Refills. Marland Kitchen  No medication changes will be discussed during procedure appointments. . No disability  issues will be discussed.

## 2017-01-17 NOTE — Progress Notes (Signed)
Safety precautions to be maintained throughout the outpatient stay will include: orient to surroundings, keep bed in low position, maintain call bell within reach at all times, provide assistance with transfer out of bed and ambulation.  

## 2017-01-17 NOTE — Patient Instructions (Addendum)
1. Repeat lumbar facet diagnostic block #2 along with bilateral SI joint injection   Preparing for your procedure (without sedation) Instructions: . Oral Intake: Do not eat or drink anything for at least 3 hours prior to your procedure. . Transportation: Unless otherwise stated by your physician, you may drive yourself after the procedure. . Blood Pressure Medicine: Take your blood pressure medicine with a sip of water the morning of the procedure. . Insulin: Take only  of your normal insulin dose. . Preventing infections: Shower with an antibacterial soap the morning of your procedure. . Build-up your immune system: Take 1000 mg of Vitamin C with every meal (3 times a day) the day prior to your procedure. . Pregnancy: If you are pregnant, call and cancel the procedure. . Sickness: If you have a cold, fever, or any active infections, call and cancel the procedure. . Arrival: You must be in the facility at least 30 minutes prior to your scheduled procedure. . Children: Do not bring any children with you. . Dress appropriately: Bring dark clothing that you would not mind if they get stained. . Valuables: Do not bring any jewelry or valuables. Procedure appointments are reserved for interventional treatments only. Marland Kitchen No Prescription Refills. . No medication changes will be discussed during procedure appointments. . No disability issues will be discussed.

## 2017-01-25 ENCOUNTER — Ambulatory Visit
Payer: Medicare Other | Attending: Student in an Organized Health Care Education/Training Program | Admitting: Student in an Organized Health Care Education/Training Program

## 2017-01-25 ENCOUNTER — Encounter: Payer: Self-pay | Admitting: Student in an Organized Health Care Education/Training Program

## 2017-01-25 ENCOUNTER — Ambulatory Visit
Admission: RE | Admit: 2017-01-25 | Discharge: 2017-01-25 | Disposition: A | Discharge status: Home / Self Care | Payer: Medicare Other | Source: Ambulatory Visit | Attending: Student in an Organized Health Care Education/Training Program | Admitting: Student in an Organized Health Care Education/Training Program

## 2017-01-25 VITALS — BP 133/68 | HR 95 | Temp 98.0°F | Resp 14 | Ht 68.0 in | Wt 135.0 lb

## 2017-01-25 DIAGNOSIS — M47816 Spondylosis without myelopathy or radiculopathy, lumbar region: Secondary | ICD-10-CM | POA: Insufficient documentation

## 2017-01-25 DIAGNOSIS — M5136 Other intervertebral disc degeneration, lumbar region: Secondary | ICD-10-CM | POA: Diagnosis not present

## 2017-01-25 DIAGNOSIS — M549 Dorsalgia, unspecified: Secondary | ICD-10-CM | POA: Diagnosis not present

## 2017-01-25 DIAGNOSIS — M47818 Spondylosis without myelopathy or radiculopathy, sacral and sacrococcygeal region: Secondary | ICD-10-CM

## 2017-01-25 DIAGNOSIS — M4698 Unspecified inflammatory spondylopathy, sacral and sacrococcygeal region: Secondary | ICD-10-CM

## 2017-01-25 DIAGNOSIS — M47898 Other spondylosis, sacral and sacrococcygeal region: Secondary | ICD-10-CM | POA: Diagnosis not present

## 2017-01-25 MED ORDER — ROPIVACAINE HCL 2 MG/ML IJ SOLN
INTRAMUSCULAR | Status: AC
  Filled 2017-01-25: qty 20

## 2017-01-25 MED ORDER — LIDOCAINE HCL (PF) 1 % IJ SOLN
10.0000 mL | Freq: Once | INTRAMUSCULAR | Status: AC
  Administered 2017-01-25: 5 mL

## 2017-01-25 MED ORDER — DEXAMETHASONE SODIUM PHOSPHATE 10 MG/ML IJ SOLN
10.0000 mg | Freq: Once | INTRAMUSCULAR | Status: AC
  Administered 2017-01-25: 10 mg

## 2017-01-25 MED ORDER — DEXAMETHASONE SODIUM PHOSPHATE 10 MG/ML IJ SOLN
INTRAMUSCULAR | Status: AC
  Filled 2017-01-25: qty 1

## 2017-01-25 MED ORDER — LACTATED RINGERS IV SOLN: 1000.0000 mL | Freq: Once | INTRAVENOUS | Status: DC

## 2017-01-25 MED ORDER — ROPIVACAINE HCL 2 MG/ML IJ SOLN
10.0000 mL | Freq: Once | INTRAMUSCULAR | Status: AC
  Administered 2017-01-25: 10 mL

## 2017-01-25 MED ORDER — FENTANYL CITRATE (PF) 100 MCG/2ML IJ SOLN: 25.0000 ug | INTRAMUSCULAR | Status: DC | PRN

## 2017-01-25 MED ORDER — LIDOCAINE HCL (PF) 1 % IJ SOLN
INTRAMUSCULAR | Status: AC
  Filled 2017-01-25: qty 5

## 2017-01-25 NOTE — Progress Notes (Signed)
Safety precautions to be maintained throughout the outpatient stay will include: orient to surroundings, keep bed in low position, maintain call bell within reach at all times, provide assistance with transfer out of bed and ambulation.  

## 2017-01-25 NOTE — Progress Notes (Signed)
Patient's Name: [name]  MRN: [id number]  Referring Provider: [name], MD  DOB: 05/25/1940  PCP: [name], MD  DOS: 01/25/2017  Note by: [name], MD  Service setting: Ambulatory outpatient  Specialty: Interventional Pain Management  Patient type: Established  Location: [facility] (AMB) Pain Management Facility  Visit type: Interventional Procedure   Primary Reason for Visit: Interventional Pain Management Treatment. CC: Back Pain (bilateral)  Procedure:  Anesthesia, Analgesia, Anxiolysis:  Type: Diagnostic Sacroiliac Joint Steroid Injection Region: Superior Lumbosacral Region Level: PSIS (Posterior Superior Iliac Spine) Laterality: Bilateral  Type: Local Anesthesia Local Anesthetic: Lidocaine 1% Route: Infiltration ([place]/IM) IV Access: Declined Sedation: Meaningful verbal contact was maintained at all times during the procedure  Indication(s): Analgesia and Anxiety   Indications: 1. 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 Lumbar degenerative disc disease    Pain Score: Pre-procedure: 5 /10 Post-procedure: 0-No pain/10  Pre-op Assessment:  Ms. Hewes is a 76 y.o. (year old), female patient, seen today for interventional treatment. She  has a past surgical history that includes Back surgery; Cataract extraction w/ intraocular lens  implant, bilateral (Bilateral, 12-2013 - [phone]); Cardiac defibrillator placement (05/07/2014); Lumbar disc surgery (1980's); Cardiac catheterization (~ 02/2014); CYSTO WITH BLADDER BIOPSY, FULGERATION, CHEMO IRRIGATION EPIRUBICIN IN PACU (N/A, 10/06/2015); CYSTOSCOPY WITH  BLADDER BIOPSY BILATERAL RETROGRADE PYELOGRAM,INSTILLATION OF MITOMYCIN C (Bilateral, 01/06/2015); BI-VENTRICULAR IMPLANTABLE CARDIOVERTER DEFIBRILLATOR  (CRT-D) (N/A, 05/07/2014); CYSTOSCOPY WITH RETROGRADE PYELOGRAM (Bilateral, 04/18/2014); TRANSURETHRAL RESECTION OF BLADDER TUMOR (TURBT), CYSTOGRAM (N/A, 04/18/2014); TRANSURETHRAL RESECTION OF BLADDER TUMOR (TURBT)  (N/A, 01/17/2014); TRANSURETHRAL RESECTION OF BLADDER TUMOR (TURBT) (N/A, 12/13/2013); and ESOPHAGOGASTRODUODENOSCOPY (EGD) (N/A, 11/28/2013). Ms. Dowse has a current medication list which includes the following prescription(s): amlodipine, cyclobenzaprine, duloxetine, gabapentin, hydrocodone-acetaminophen, lisinopril, lisinopril, metoprolol succinate, pantoprazole, polyethylene glycol, promethazine, sucralfate, spironolactone, and spironolactone, and the following Facility-Administered Medications: fentanyl and lactated ringers. Her primarily concern today is the Back Pain (bilateral)  Initial Vital Signs: Blood pressure 133/68, pulse 95, temperature 98 F (36.7 C), resp. rate 14, height 5\' 8"  (1.727 m), weight 135 lb (61.2 kg), SpO2 99 %. BMI: Estimated body mass index is 20.53 kg/m as calculated from the following:   Height as of this encounter: 5\' 8"  (1.727 m).   Weight as of this encounter: 135 lb (61.2 kg).  Risk Assessment: Allergies: Reviewed. She has No Known Allergies.  Allergy Precautions: None required Coagulopathies: Reviewed. None identified.  Blood-thinner therapy: None at this time Active Infection(s): Reviewed. None identified. Ms. Knippenberg is afebrile  Site Confirmation: Ms. Beaston was asked to confirm the procedure and laterality before marking the site Procedure checklist: Completed Consent: Before the procedure and under the influence of no sedative(s), amnesic(s), or anxiolytics, the patient was informed of the treatment options, risks and possible complications. To fulfill our ethical and legal obligations, as recommended by the American Medical Association's Code of Ethics, I have informed the patient of my clinical impression; the nature and purpose of the treatment or procedure; the risks, benefits, and possible complications of the intervention; the alternatives, including doing nothing; the risk(s) and benefit(s) of the alternative treatment(s) or procedure(s); and the risk(s)  and benefit(s) of doing nothing. The patient was provided information about the general risks and possible complications associated with the procedure. These may include, but are not limited to: failure to achieve desired goals, infection, bleeding, organ or nerve damage, allergic reactions, paralysis, and death. In addition, the patient was informed of those risks and complications associated to the procedure, such  as failure to decrease pain; infection; bleeding; organ or nerve damage with subsequent damage to sensory, motor, and/or autonomic systems, resulting in permanent pain, numbness, and/or weakness of one or several areas of the body; allergic reactions; (i.e.: anaphylactic reaction); and/or death. Furthermore, the patient was informed of those risks and complications associated with the medications. These include, but are not limited to: allergic reactions (i.e.: anaphylactic or anaphylactoid reaction(s)); adrenal axis suppression; blood sugar elevation that in diabetics may result in ketoacidosis or comma; water retention that in patients with history of congestive heart failure may result in shortness of breath, pulmonary edema, and decompensation with resultant heart failure; weight gain; swelling or edema; medication-induced neural toxicity; particulate matter embolism and blood vessel occlusion with resultant organ, and/or nervous system infarction; and/or aseptic necrosis of one or more joints. Finally, the patient was informed that Medicine is not an exact science; therefore, there is also the possibility of unforeseen or unpredictable risks and/or possible complications that may result in a catastrophic outcome. The patient indicated having understood very clearly. We have given the patient no guarantees and we have made no promises. Enough time was given to the patient to ask questions, all of which were answered to the patient's satisfaction. Ms. Hancher has indicated that she wanted to continue  with the procedure. Attestation: I, the ordering provider, attest that I have discussed with the patient the benefits, risks, side-effects, alternatives, likelihood of achieving goals, and potential problems during recovery for the procedure that I have provided informed consent. Date: 01/25/2017; Time: 2:41 PM  Pre-Procedure Preparation:  Monitoring: As per clinic protocol. Respiration, ETCO2, SpO2, BP, heart rate and rhythm monitor placed and checked for adequate function Safety Precautions: Patient was assessed for positional comfort and pressure points before starting the procedure. Time-out: I initiated and conducted the "Time-out" before starting the procedure, as per protocol. The patient was asked to participate by confirming the accuracy of the "Time Out" information. Verification of the correct person, site, and procedure were performed and confirmed by me, the nursing staff, and the patient. "Time-out" conducted as per Joint Commission's Universal Protocol (UP.01.01.01). "Time-out" Date & Time: 01/25/2017; 1244 hrs.  Description of Procedure Process:  Position: Prone Target Area: Superior, posterior, aspect of the sacroiliac fissure Approach: Posterior, paraspinal, ipsilateral approach. Area Prepped: Entire Lower Lumbosacral Region Prepping solution: ChloraPrep (2% chlorhexidine gluconate and 70% isopropyl alcohol) Safety Precautions: Aspiration looking for blood return was conducted prior to all injections. At no point did we inject any substances, as a needle was being advanced. No attempts were made at seeking any paresthesias. Safe injection practices and needle disposal techniques used. Medications properly checked for expiration dates. SDV (single dose vial) medications used. Description of the Procedure: Protocol guidelines were followed. The patient was placed in position over the procedure table. The target area was identified and the area prepped in the usual manner. Skin & deeper  tissues infiltrated with local anesthetic. Appropriate amount of time allowed to pass for local anesthetics to take effect. The procedure needle was advanced under fluoroscopic guidance into the sacroiliac joint until a firm endpoint was obtained. Proper needle placement secured. Negative aspiration confirmed. Solution injected in intermittent fashion, asking for systemic symptoms every 0.5cc of injectate. The needles were then removed and the area cleansed, making sure to leave some of the prepping solution back to take advantage of its long term bactericidal properties. Vitals:   01/25/17 1238 01/25/17 1243 01/25/17 1247 01/25/17 1250  BP: (!) 105/57 128/60 136/62 133/68  Pulse:      Resp: 10 11 11 14   Temp:      SpO2: 99% 98% 99% 99%  Weight:      Height:        Start Time: 1245 hrs. End Time: 1249 hrs. Materials:  Needle(s) Type: Regular needle Gauge: 22G Length: 3.5-in Medication(s): We administered lidocaine (PF), ropivacaine (PF) 2 mg/mL (0.2%), and dexamethasone. Please see chart orders for dosing details. 10 cc solution made of 9 cc of 0.2% ropivacaine, 1 cc of Decadron 10 mg/cc.  2.5 cc injected intra-articular, 2.5 cc injected periarticular on each side. Imaging Guidance (Non-Spinal):  Type of Imaging Technique: Fluoroscopy Guidance (Non-Spinal) Indication(s): Assistance in needle guidance and placement for procedures requiring needle placement in or near specific anatomical locations not easily accessible without such assistance. Exposure Time: Please see nurses notes. Contrast: Before injecting any contrast, we confirmed that the patient did not have an allergy to iodine, shellfish, or radiological contrast. Once satisfactory needle placement was completed at the desired level, radiological contrast was injected. Contrast injected under live fluoroscopy. No contrast complications. See chart for type and volume of contrast used. Fluoroscopic Guidance: I was personally present  during the use of fluoroscopy. "Tunnel Vision Technique" used to obtain the best possible view of the target area. Parallax error corrected before commencing the procedure. "Direction-depth-direction" technique used to introduce the needle under continuous pulsed fluoroscopy. Once target was reached, antero-posterior, oblique, and lateral fluoroscopic projection used confirm needle placement in all planes. Images permanently stored in EMR. Interpretation: I personally interpreted the imaging intraoperatively. Adequate needle placement confirmed in multiple planes. Appropriate spread of contrast into desired area was observed. No evidence of afferent or efferent intravascular uptake. Permanent images saved into the patient's record.  Antibiotic Prophylaxis:  Indication(s): None identified Antibiotic given: None  Post-operative Assessment:  EBL: None Complications: No immediate post-treatment complications observed by team, or reported by patient. Note: The patient tolerated the entire procedure well. A repeat set of vitals were taken after the procedure and the patient was kept under observation following institutional policy, for this type of procedure. Post-procedural neurological assessment was performed, showing return to baseline, prior to discharge. The patient was provided with post-procedure discharge instructions, including a section on how to identify potential problems. Should any problems arise concerning this procedure, the patient was given instructions to immediately contact us, at any time, without hesitation. In any case, we plan to contact the patient by telephone for a follow-up status report regarding this interventional procedure. Comments:  No additional relevant information.  Plan of Care   Imaging Orders     DG C-Arm 1-60 Min-No Report Procedure Orders    No procedure(s) ordered today   Patient to follow-up in 3-4 weeks for postprocedural evaluation. Medications ordered  for procedure: Meds ordered this encounter  Medications  . fentaNYL (SUBLIMAZE) injection 25-100 mcg    Make sure Narcan is available in the pyxis when using this medication. In the event of respiratory depression (RR< 8/min): Titrate NARCAN (naloxone) in increments of 0.1 to 0.2 mg IV at 2-3 minute intervals, until desired degree of reversal.  . lactated ringers infusion 1,000 mL  . lidocaine (PF) (XYLOCAINE) 1 % injection 10 mL  . ropivacaine (PF) 2 mg/mL (0.2%) (NAROPIN) injection 10 mL  . dexamethasone (DECADRON) injection 10 mg   Medications administered: We administered lidocaine (PF), ropivacaine (PF) 2 mg/mL (0.2%), and dexamethasone.  See the medical record for exact dosing, route, and time of administration.  This  SmartLink is deprecated. Use AVSMEDLIST instead to display the medication list for a patient. Disposition: Discharge home  Discharge Date & Time: 01/25/2017; 1252 hrs.   Physician-requested Follow-up: Return in about 3 weeks (around 02/15/2017). Future Appointments  Date Time Provider [place]  02/07/2017 11:20 AM Wellington Hampshire, MD [facility]  02/16/2017 12:15 PM [name], MD [facility] None  03/22/2017 11:45 AM Deboraha Sprang, MD [facility]  03/24/2017  1:30 PM [name], MD [facility]  05/16/2017  3:00 PM O'Brien-Blaney, Bryson Corona, LPN [facility]   Primary Care Physician: [name], MD Location: [facility] Note by: [name], MD Date: 01/25/2017; Time: 2:42 PM  Disclaimer:  Medicine is not an exact science. The only guarantee in medicine is that nothing is guaranteed. It is important to note that the decision to proceed with this intervention was based on the information collected from the patient. The Data and conclusions were drawn from the patient's questionnaire, the interview, and the physical examination. Because the information was provided in large part by the patient,  it cannot be guaranteed that it has not been purposely or unconsciously manipulated. Every effort has been made to obtain as much relevant data as possible for this evaluation. It is important to note that the conclusions that lead to this procedure are derived in large part from the available data. Always take into account that the treatment will also be dependent on availability of resources and existing treatment guidelines, considered by other Pain Management Practitioners as being common knowledge and practice, at the time of the intervention. For Medico-Legal purposes, it is also important to point out that variation in procedural techniques and pharmacological choices are the acceptable norm. The indications, contraindications, technique, and results of the above procedure should only be interpreted and judged by a Board-Certified Interventional Pain Specialist with extensive familiarity and expertise in the same exact procedure and technique.

## 2017-01-25 NOTE — Progress Notes (Signed)
Patient's Name: [name]  MRN: [id number]  Referring Provider: [name], MD  DOB: [birth year]  PCP: [name], MD  DOS: 01/25/2017  Note by: [name], MD  Service setting: Ambulatory outpatient  Specialty: Interventional Pain Management  Patient type: Established  Location: [facility] (AMB) Pain Management Facility  Visit type: Interventional Procedure   Primary Reason for Visit: Interventional Pain Management Treatment. CC: Back Pain (bilateral)  Procedure:  Anesthesia, Analgesia, Anxiolysis:  Type: Diagnostic Medial Branch Facet Block #2 Region: Lumbar Level: L3, L4, L5,  Medial Branch Level(s) Laterality: Bilateral  Type: Local Anesthesia Local Anesthetic: Lidocaine 1% Route: Infiltration ([place]/IM) IV Access: Declined Sedation: Meaningful verbal contact was maintained at all times during the procedure  Indication(s): Analgesia and Anxiety   Indications: 1. 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 Lumbar degenerative disc disease    Pain Score: Pre-procedure: 5 /10 Post-procedure: 0-No pain/10  Pre-op Assessment:  [name] is a 76 y.o. (year old), female patient, seen today for interventional treatment. She  has a past surgical history that includes Back surgery; Cataract extraction w/ intraocular lens  implant, bilateral (Bilateral, 12-2013 - [id number]); Cardiac defibrillator placement (05/07/2014); Lumbar disc surgery (1980's); Cardiac catheterization (~ 02/2014); CYSTO WITH BLADDER BIOPSY, FULGERATION, CHEMO IRRIGATION EPIRUBICIN IN PACU (N/A, 10/06/2015); CYSTOSCOPY WITH  BLADDER BIOPSY BILATERAL RETROGRADE PYELOGRAM,INSTILLATION OF MITOMYCIN C (Bilateral, 01/06/2015); BI-VENTRICULAR IMPLANTABLE CARDIOVERTER DEFIBRILLATOR  (CRT-D) (N/A, 05/07/2014); CYSTOSCOPY WITH RETROGRADE PYELOGRAM (Bilateral, 04/18/2014); TRANSURETHRAL RESECTION OF BLADDER TUMOR (TURBT), CYSTOGRAM (N/A, 04/18/2014); TRANSURETHRAL RESECTION OF BLADDER TUMOR (TURBT) (N/A, 01/17/2014);  TRANSURETHRAL RESECTION OF BLADDER TUMOR (TURBT) (N/A, 12/13/2013); and ESOPHAGOGASTRODUODENOSCOPY (EGD) (N/A, 11/28/2013). [name] has a current medication list which includes the following prescription(s): amlodipine, cyclobenzaprine, duloxetine, gabapentin, hydrocodone-acetaminophen, lisinopril, lisinopril, metoprolol succinate, pantoprazole, polyethylene glycol, promethazine, sucralfate, spironolactone, and spironolactone, and the following Facility-Administered Medications: fentanyl and lactated ringers. Her primarily concern today is the Back Pain (bilateral)  Initial Vital Signs: There were no vitals taken for this visit. BMI: Estimated body mass index is 20.53 kg/m as calculated from the following:   Height as of this encounter: 5\' 8"  (1.727 m).   Weight as of this encounter: 135 lb (61.2 kg).  Risk Assessment: Allergies: Reviewed. She has No Known Allergies.  Allergy Precautions: None required Coagulopathies: Reviewed. None identified.  Blood-thinner therapy: None at this time Active Infection(s): Reviewed. None identified. [name] is afebrile  Site Confirmation: [name] was asked to confirm the procedure and laterality before marking the site Procedure checklist: Completed Consent: Before the procedure and under the influence of no sedative(s), amnesic(s), or anxiolytics, the patient was informed of the treatment options, risks and possible complications. To fulfill our ethical and legal obligations, as recommended by the American Medical Association's Code of Ethics, I have informed the patient of my clinical impression; the nature and purpose of the treatment or procedure; the risks, benefits, and possible complications of the intervention; the alternatives, including doing nothing; the risk(s) and benefit(s) of the alternative treatment(s) or procedure(s); and the risk(s) and benefit(s) of doing nothing. The patient was provided information about the general risks and possible  complications associated with the procedure. These may include, but are not limited to: failure to achieve desired goals, infection, bleeding, organ or nerve damage, allergic reactions, paralysis, and death. In addition, the patient was informed of those risks and complications associated to Spine-related procedures, such as failure to decrease pain; infection (i.e.: Meningitis, epidural or intraspinal abscess); bleeding (i.e.: epidural hematoma, subarachnoid  hemorrhage, or any other type of intraspinal or peri-dural bleeding); organ or nerve damage (i.e.: Any type of peripheral nerve, nerve root, or spinal cord injury) with subsequent damage to sensory, motor, and/or autonomic systems, resulting in permanent pain, numbness, and/or weakness of one or several areas of the body; allergic reactions; (i.e.: anaphylactic reaction); and/or death. Furthermore, the patient was informed of those risks and complications associated with the medications. These include, but are not limited to: allergic reactions (i.e.: anaphylactic or anaphylactoid reaction(s)); adrenal axis suppression; blood sugar elevation that in diabetics may result in ketoacidosis or comma; water retention that in patients with history of congestive heart failure may result in shortness of breath, pulmonary edema, and decompensation with resultant heart failure; weight gain; swelling or edema; medication-induced neural toxicity; particulate matter embolism and blood vessel occlusion with resultant organ, and/or nervous system infarction; and/or aseptic necrosis of one or more joints. Finally, the patient was informed that Medicine is not an exact science; therefore, there is also the possibility of unforeseen or unpredictable risks and/or possible complications that may result in a catastrophic outcome. The patient indicated having understood very clearly. We have given the patient no guarantees and we have made no promises. Enough time was given to the  patient to ask questions, all of which were answered to the patient's satisfaction. [name] has indicated that she wanted to continue with the procedure. Attestation: I, the ordering provider, attest that I have discussed with the patient the benefits, risks, side-effects, alternatives, likelihood of achieving goals, and potential problems during recovery for the procedure that I have provided informed consent. Date: 01/25/2017; Time: 11:56 AM  Pre-Procedure Preparation:  Monitoring: As per clinic protocol. Respiration, ETCO2, SpO2, BP, heart rate and rhythm monitor placed and checked for adequate function Safety Precautions: Patient was assessed for positional comfort and pressure points before starting the procedure. Time-out: I initiated and conducted the "Time-out" before starting the procedure, as per protocol. The patient was asked to participate by confirming the accuracy of the "Time Out" information. Verification of the correct person, site, and procedure were performed and confirmed by me, the nursing staff, and the patient. "Time-out" conducted as per Joint Commission's Universal Protocol (UP.01.01.01). "Time-out" Date & Time: 01/25/2017; 1244 hrs.  Description of Procedure Process:   Position: Prone Target Area: For Lumbar Facet blocks, the target is the groove formed by the junction of the transverse process and superior articular process. For the L5 dorsal ramus, the target is the notch between superior articular process and sacral ala.  Approach: Paramedial approach. Area Prepped: Entire Posterior Lumbosacral Region Prepping solution: ChloraPrep (2% chlorhexidine gluconate and 70% isopropyl alcohol) Safety Precautions: Aspiration looking for blood return was conducted prior to all injections. At no point did we inject any substances, as a needle was being advanced. No attempts were made at seeking any paresthesias. Safe injection practices and needle disposal techniques used.  Medications properly checked for expiration dates. SDV (single dose vial) medications used. Description of the Procedure: Protocol guidelines were followed. The patient was placed in position over the fluoroscopy table. The target area was identified and the area prepped in the usual manner. Skin desensitized using vapocoolant spray. Skin & deeper tissues infiltrated with local anesthetic. Appropriate amount of time allowed to pass for local anesthetics to take effect. The procedure needle was introduced through the skin, ipsilateral to the reported pain, and advanced to the target area. Employing the "Medial Branch Technique", the needles were advanced to the angle made by  the superior and medial portion of the transverse process, and the lateral and inferior portion of the superior articulating process of the targeted vertebral bodies. This area is known as "[place]'s Eye" or the "Eye of the [place] Dog". A procedure needle was introduced through the skin, and this time advanced to the angle made by the superior and medial border of the sacral ala, and the lateral border of the S1 vertebral body. Negative aspiration confirmed. Solution injected in intermittent fashion, asking for systemic symptoms every 0.5cc of injectate. The needles were then removed and the area cleansed, making sure to leave some of the prepping solution back to take advantage of its long term bactericidal properties.   Illustration of the posterior view of the lumbar spine and the posterior neural structures. Laminae of L2 through S1 are labeled. DPRL5, dorsal primary ramus of L5; DPRS1, dorsal primary ramus of S1; DPR3, dorsal primary ramus of L3; FJ, facet (zygapophyseal) joint L3-L4; I, inferior articular process of L4; LB1, lateral branch of dorsal primary ramus of L1; IAB, inferior articular branches from L3 medial branch (supplies L4-L5 facet joint); IBP, intermediate branch plexus; MB3, medial branch of dorsal primary ramus of L3;  NR3, third lumbar nerve root; S, superior articular process of L5; SAB, superior articular branches from L4 (supplies L4-5 facet joint also); TP3, transverse process of L3.  Vitals:   01/25/17 1238 01/25/17 1243 01/25/17 1247 01/25/17 1250  BP: (!) 105/57 128/60 136/62 133/68  Pulse:      Resp: 10 11 11 14   Temp:      SpO2: 99% 98% 99% 99%  Weight:      Height:        Start Time: 1245 hrs. End Time: 1249 hrs. Materials:  Needle(s) Type: Regular needle Gauge: 22G Length: 3.5-in Medication(s): We administered lidocaine (PF), ropivacaine (PF) 2 mg/mL (0.2%), and dexamethasone. Please see chart orders for dosing details. 1.5 cc of ropivacaine at each level. Imaging Guidance (Spinal):  Type of Imaging Technique: Fluoroscopy Guidance (Spinal) Indication(s): Assistance in needle guidance and placement for procedures requiring needle placement in or near specific anatomical locations not easily accessible without such assistance. Exposure Time: Please see nurses notes. Contrast: None used. Fluoroscopic Guidance: I was personally present during the use of fluoroscopy. "Tunnel Vision Technique" used to obtain the best possible view of the target area. Parallax error corrected before commencing the procedure. "Direction-depth-direction" technique used to introduce the needle under continuous pulsed fluoroscopy. Once target was reached, antero-posterior, oblique, and lateral fluoroscopic projection used confirm needle placement in all planes. Images permanently stored in EMR. Interpretation: No contrast injected. I personally interpreted the imaging intraoperatively. Adequate needle placement confirmed in multiple planes. Permanent images saved into the patient's record.  Antibiotic Prophylaxis:  Indication(s): None identified Antibiotic given: None  Post-operative Assessment:  EBL: None Complications: No immediate post-treatment complications observed by team, or reported by patient. Note:  The patient tolerated the entire procedure well. A repeat set of vitals were taken after the procedure and the patient was kept under observation following institutional policy, for this type of procedure. Post-procedural neurological assessment was performed, showing return to baseline, prior to discharge. The patient was provided with post-procedure discharge instructions, including a section on how to identify potential problems. Should any problems arise concerning this procedure, the patient was given instructions to immediately contact us, at any time, without hesitation. In any case, we plan to contact the patient by telephone for a follow-up status report regarding this interventional procedure. Comments:  No additional relevant information.  Plan of Care  5 out of 5 strength bilateral lower extremity: Plantar flexion, dorsiflexion, knee flexion, knee extension.  Imaging Orders     DG C-Arm 1-60 Min-No Report Procedure Orders    No procedure(s) ordered today    Medications ordered for procedure: Meds ordered this encounter  Medications  . fentaNYL (SUBLIMAZE) injection 25-100 mcg    Make sure Narcan is available in the pyxis when using this medication. In the event of respiratory depression (RR< 8/min): Titrate NARCAN (naloxone) in increments of 0.1 to 0.2 mg IV at 2-3 minute intervals, until desired degree of reversal.  . lactated ringers infusion 1,000 mL  . lidocaine (PF) (XYLOCAINE) 1 % injection 10 mL  . ropivacaine (PF) 2 mg/mL (0.2%) (NAROPIN) injection 10 mL  . dexamethasone (DECADRON) injection 10 mg   Medications administered: We administered lidocaine (PF), ropivacaine (PF) 2 mg/mL (0.2%), and dexamethasone.  See the medical record for exact dosing, route, and time of administration.  This SmartLink is deprecated. Use AVSMEDLIST instead to display the medication list for a patient. Disposition: Discharge home  Discharge Date & Time: 01/25/2017; 1252 hrs.    Physician-requested Follow-up: Return in about 3 weeks (around 02/15/2017). Future Appointments  Date Time Provider [place]  02/07/2017 11:20 AM Wellington Hampshire, MD [facility]  02/16/2017 12:15 PM [name], MD [facility] None  03/22/2017 11:45 AM Deboraha Sprang, MD [facility]  03/24/2017  1:30 PM [name], MD [facility]  05/16/2017  3:00 PM O'Brien-Blaney, Bryson Corona, LPN [facility]   Primary Care Physician: [name], MD Location: [facility] Outpatient [facility] Management Facility Note by: [name], MD Date: 01/25/2017; Time: 2:33 PM  Disclaimer:  Medicine is not an exact science. The only guarantee in medicine is that nothing is guaranteed. It is important to note that the decision to proceed with this intervention was based on the information collected from the patient. The Data and conclusions were drawn from the patient's questionnaire, the interview, and the physical examination. Because the information was provided in large part by the patient, it cannot be guaranteed that it has not been purposely or unconsciously manipulated. Every effort has been made to obtain as much relevant data as possible for this evaluation. It is important to note that the conclusions that lead to this procedure are derived in large part from the available data. Always take into account that the treatment will also be dependent on availability of resources and existing treatment guidelines, considered by other Pain Management Practitioners as being common knowledge and practice, at the time of the intervention. For Medico-Legal purposes, it is also important to point out that variation in procedural techniques and pharmacological choices are the acceptable norm. The indications, contraindications, technique, and results of the above procedure should only be interpreted and judged by a Board-Certified Interventional Pain Specialist with extensive familiarity and  expertise in the same exact procedure and technique.

## 2017-01-25 NOTE — Patient Instructions (Addendum)
1. Schedule for left sided ablation L3-L5 with sedation- next available 1-2 weeks 2. Follow up in January medication management

## 2017-01-26 ENCOUNTER — Telehealth: Payer: Self-pay | Admitting: *Deleted

## 2017-01-26 NOTE — Telephone Encounter (Signed)
Attempted to call patient for post procedure follow-up. Message left. 

## 2017-01-30 ENCOUNTER — Other Ambulatory Visit: Payer: Self-pay

## 2017-01-30 MED ORDER — PANTOPRAZOLE SODIUM 40 MG PO TBEC: DELAYED_RELEASE_TABLET | ORAL | 2 refills | Status: DC

## 2017-02-01 ENCOUNTER — Other Ambulatory Visit: Payer: Self-pay | Admitting: Internal Medicine

## 2017-02-07 ENCOUNTER — Encounter: Payer: Self-pay | Admitting: Cardiovascular Disease

## 2017-02-07 ENCOUNTER — Encounter: Payer: Self-pay | Admitting: *Deleted

## 2017-02-07 ENCOUNTER — Ambulatory Visit (INDEPENDENT_AMBULATORY_CARE_PROVIDER_SITE_OTHER): Payer: Medicare Other | Admitting: Cardiovascular Disease

## 2017-02-07 ENCOUNTER — Other Ambulatory Visit: Payer: Self-pay | Admitting: *Deleted

## 2017-02-07 VITALS — BP 126/66 | HR 79 | Ht 68.0 in | Wt 138.0 lb

## 2017-02-07 DIAGNOSIS — I5022 Chronic systolic (congestive) heart failure: Secondary | ICD-10-CM

## 2017-02-07 DIAGNOSIS — I10 Essential (primary) hypertension: Secondary | ICD-10-CM

## 2017-02-07 DIAGNOSIS — Z720 Tobacco use: Secondary | ICD-10-CM | POA: Diagnosis not present

## 2017-02-07 NOTE — Progress Notes (Signed)
Cardiology Office Note   Date:  02/08/2017   ID:  [name], DOB Apr 17, 1940, MRN [id number]  PCP:  [name], MD  Cardiologist:   Kathlyn Sacramento, MD   No chief complaint on file.     History of Present Illness:  [name] is a 76 y.o. female who presents for a follow-up visit regarding chronic systolic heart failure due to nonischemic cardiomyopathy.  Previous ejection fraction was 20% in 2015.  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 She continued to have New York Heart Association class III symptoms . She underwent ICD CRT placement by Dr. Caryl Comes in 04/2014 . She responded very well after that. Echocardiogram in October 2017 showed normalization of LV systolic function. She has known history of bladder cancer that was treated. She reports chronic back pain which limits her activities. She continues to smoke 10 cigarettes per day.  Previous ABI was normal. She has been doing well overall with no chest pain, shortness of breath or palpitations.  She has been taking her medications regularly.   Past Medical History:  Diagnosis Date  . AICD (automatic cardioverter/defibrillator) present   . Anemia   . Arthritis    "in about all my joints; for sure in my back"  . Bladder cancer ([place]) 1995; 2015  . CHF (congestive heart failure) ([place])   . Chronic lower back pain   . COPD (chronic obstructive pulmonary disease) ([place])   . Ectopic atrial beats   . Frequent urination   . GERD (gastroesophageal reflux disease)    h/o ulcers  . History of stomach ulcers 11/2013  . Hypertension   . LBBB (left bundle branch block)   . Migraines    "stopped before menopause"    Past Surgical History:  Procedure Laterality Date  . BACK SURGERY    . BI-VENTRICULAR IMPLANTABLE CARDIOVERTER DEFIBRILLATOR N/A 05/07/2014   Procedure: BI-VENTRICULAR IMPLANTABLE CARDIOVERTER DEFIBRILLATOR   (CRT-D);  Surgeon: Deboraha Sprang, MD;  Location: [facility] CATH LAB;  Service: Cardiovascular;  Laterality: N/A;  . CARDIAC CATHETERIZATION  ~ 02/2014  . CARDIAC DEFIBRILLATOR PLACEMENT  05/07/2014   dual chamber  . CATARACT EXTRACTION W/ INTRAOCULAR LENS  IMPLANT, BILATERAL Bilateral 12-2013 - 02-2014  . CYSTOSCOPY W/ RETROGRADES Bilateral 01/06/2015   Procedure: CYSTOSCOPY WITH  BLADDER BIOPSY BILATERAL RETROGRADE PYELOGRAM,INSTILLATION OF MITOMYCIN C;  Surgeon: [name], MD;  Location: [facility];  Service: Urology;  Laterality: Bilateral;  . CYSTOSCOPY WITH BIOPSY N/A 10/06/2015   Procedure: CYSTO WITH BLADDER BIOPSY, FULGERATION, CHEMO IRRIGATION EPIRUBICIN IN PACU;  Surgeon: [name], MD;  Location: [facility];  Service: Urology;  Laterality: N/A;  . CYSTOSCOPY WITH RETROGRADE PYELOGRAM, URETEROSCOPY AND STENT PLACEMENT Bilateral 04/18/2014   Procedure: CYSTOSCOPY WITH RETROGRADE PYELOGRAM;  Surgeon: [name], MD;  Location: [facility];  Service: Urology;  Laterality: Bilateral;  . ESOPHAGOGASTRODUODENOSCOPY N/A 11/28/2013   Procedure: ESOPHAGOGASTRODUODENOSCOPY (EGD);  Surgeon: Arta Silence, MD;  Location: [facility] ENDOSCOPY;  Service: Endoscopy;  Laterality: N/A;  . LUMBAR [place]  1980's   "ruptured disc"  . TRANSURETHRAL RESECTION OF BLADDER TUMOR N/A 12/13/2013   Procedure: TRANSURETHRAL RESECTION OF BLADDER TUMOR (TURBT);  Surgeon: [name], MD;  Location: [facility];  Service: Urology;  Laterality: N/A;  . TRANSURETHRAL RESECTION OF BLADDER TUMOR N/A 01/17/2014   Procedure: TRANSURETHRAL RESECTION OF BLADDER TUMOR (TURBT);  Surgeon: [name], MD;  Location: [facility];  Service: Urology;  Laterality: N/A;  . TRANSURETHRAL RESECTION OF BLADDER TUMOR N/A 04/18/2014   Procedure: TRANSURETHRAL RESECTION OF BLADDER TUMOR (TURBT), CYSTOGRAM;  Surgeon: [name], MD;  Location: [facility];  Service: Urology;  Laterality: N/A;     Current Outpatient Medications  Medication  Sig Dispense Refill  . amLODipine (NORVASC) 10 MG tablet TAKE 1/2 TABLET(5MG ) BY MOUTH TWICE DAILY**NOTE DOSE** 30 tablet 0  . cyclobenzaprine (FLEXERIL) 5 MG tablet TAKE 1 TABLET(5 MG) BY MOUTH THREE TIMES DAILY AS NEEDED FOR MUSCLE SPASMS 90 tablet 0  . DULoxetine (CYMBALTA) 30 MG capsule TAKE 1 CAPSULE(30 MG) BY MOUTH DAILY 30 capsule 0  . gabapentin (NEURONTIN) 300 MG capsule TAKE 1 CAPSULE BY MOUTH TWICE DAILY EVERY NIGHT AT BEDTIME AS DIRECTED 60 capsule 0  . HYDROcodone-acetaminophen (NORCO) 10-325 MG tablet Take 1 tablet by mouth every 4 (four) hours as needed for severe pain (Pain). 180 tablet 0  . lisinopril (PRINIVIL,ZESTRIL) 40 MG tablet TAKE 1 TABLET(40 MG) BY MOUTH DAILY 30 tablet 0  . metoprolol succinate (TOPROL-XL) 25 MG 24 hr tablet Take 1 tablet (25 mg total) by mouth daily. 30 tablet 3  . pantoprazole (PROTONIX) 40 MG tablet TAKE 1 TABLET(40 MG) BY MOUTH TWICE DAILY 60 tablet 2  . polyethylene glycol (MIRALAX / GLYCOLAX) packet Take 17 g by mouth daily as needed for mild constipation.     . promethazine (PHENERGAN) 12.5 MG tablet TAKE 1 TABLET BY MOUTH EVERY 8 HOURS AS NEEDED FOR NAUSEA OR VOMITING 60 tablet 0  . spironolactone (ALDACTONE) 25 MG tablet TAKE 1 TABLET BY MOUTH DAILY 30 tablet 5  . sucralfate (CARAFATE) 1 g tablet TAKE 1 TABLET BY MOUTH FOUR TIMES DAILY EVERY NIGHT AT BEDTIME WITH MEALS 120 tablet 0   No current facility-administered medications for this visit.     Allergies:   Patient has no known allergies.    Social History:  The patient  reports that she has been smoking cigarettes.  She has a 27.50 pack-year smoking history. she has never used smokeless tobacco. She reports that she does not drink alcohol or use drugs.   Family History:  The patient's family history includes Cancer in her brother and mother.    ROS:  Please see the history of present illness.   Otherwise, review of systems are positive for none.   All other systems are reviewed and  negative.    PHYSICAL EXAM: VS:  BP 126/66   Pulse 79   Ht 5\' 8"  (1.727 m)   Wt 138 lb (62.6 kg)   SpO2 97%   BMI 20.98 kg/m  , BMI Body mass index is 20.98 kg/m. GEN: Well nourished, well developed, in no acute distress  HEENT: normal  Neck: no JVD, carotid bruits, or masses Cardiac: RRR; no murmurs, rubs, or gallops,no edema  Respiratory:  clear to auscultation bilaterally, normal work of breathing GI: soft, nontender, nondistended, + BS MS: no deformity or atrophy  Skin: warm and dry, no rash Neuro:  Strength and sensation are intact Psych: euthymic mood, full affect Vascular: Femoral pulses +2 on the right side and +1 on the left. Dorsalis pedis is +2 bilaterally. Posterior tibial is absent bilaterally. There is reddish discoloration of both feet with no warmth or signs of cellulitis.  EKG:  EKG is not ordered today.    Recent Labs: 05/24/2016: ALT 15 10/10/2016: TSH 0.67 11/02/2016: BUN 10; Creatinine, Ser 0.69; Potassium 4.2; Sodium 133    Lipid Panel    Component Value Date/Time  CHOL 175 08/31/2010 1740   TRIG 67 08/31/2010 1740   HDL 52 08/31/2010 1740   CHOLHDL 3.4 08/31/2010 1740   VLDL 13 08/31/2010 1740   LDLCALC 110 (H) 08/31/2010 1740      Wt Readings from Last 3 Encounters:  02/07/17 138 lb (62.6 kg)  01/25/17 135 lb (61.2 kg)  01/17/17 135 lb (61.2 kg)       ASSESSMENT AND PLAN:  1.  Chronic systolic heart failure: Due to nonischemic cardiomyopathy. Currently New York Heart Association class II. She is on optimal medical therapy.  Most recent ejection fraction was normal.  2. Tobacco use: I had a prolonged discussion with her about the importance of smoking cessation.  3. Status post ICD/CRT placement: Followed by Dr. Caryl Comes.  4. Essential hypertension: Blood pressure is well controlled on current medications.  Disposition:   FU with me in 6 months  Signed,  Kathlyn Sacramento, MD  02/08/2017 6:52 PM    [place]

## 2017-02-07 NOTE — Patient Instructions (Signed)
Your physician wants you to follow-up in: [name] will receive a reminder letter in the mail two months in advance. If you don't receive a letter, please call our office to schedule the follow-up appointment.   If you need a refill on your cardiac medications before your next appointment, please call your pharmacy.

## 2017-02-13 ENCOUNTER — Other Ambulatory Visit: Payer: Self-pay | Admitting: Internal Medicine

## 2017-02-13 DIAGNOSIS — Z23 Encounter for immunization: Secondary | ICD-10-CM | POA: Diagnosis not present

## 2017-02-16 ENCOUNTER — Encounter: Payer: Self-pay | Admitting: Student in an Organized Health Care Education/Training Program

## 2017-02-16 ENCOUNTER — Other Ambulatory Visit: Payer: Self-pay

## 2017-02-16 ENCOUNTER — Ambulatory Visit
Payer: Medicare Other | Attending: Student in an Organized Health Care Education/Training Program | Admitting: Student in an Organized Health Care Education/Training Program

## 2017-02-16 VITALS — BP 144/78 | HR 46 | Temp 97.5°F | Resp 18 | Ht 68.0 in | Wt 138.0 lb

## 2017-02-16 DIAGNOSIS — I110 Hypertensive heart disease with heart failure: Secondary | ICD-10-CM | POA: Diagnosis not present

## 2017-02-16 DIAGNOSIS — J449 Chronic obstructive pulmonary disease, unspecified: Secondary | ICD-10-CM | POA: Diagnosis not present

## 2017-02-16 DIAGNOSIS — F17210 Nicotine dependence, cigarettes, uncomplicated: Secondary | ICD-10-CM | POA: Diagnosis not present

## 2017-02-16 DIAGNOSIS — M533 Sacrococcygeal disorders, not elsewhere classified: Secondary | ICD-10-CM | POA: Insufficient documentation

## 2017-02-16 DIAGNOSIS — M4802 Spinal stenosis, cervical region: Secondary | ICD-10-CM | POA: Diagnosis not present

## 2017-02-16 DIAGNOSIS — Z79899 Other long term (current) drug therapy: Secondary | ICD-10-CM | POA: Diagnosis not present

## 2017-02-16 DIAGNOSIS — K5900 Constipation, unspecified: Secondary | ICD-10-CM | POA: Insufficient documentation

## 2017-02-16 DIAGNOSIS — M5136 Other intervertebral disc degeneration, lumbar region: Secondary | ICD-10-CM

## 2017-02-16 DIAGNOSIS — M4698 Unspecified inflammatory spondylopathy, sacral and sacrococcygeal region: Secondary | ICD-10-CM | POA: Diagnosis not present

## 2017-02-16 DIAGNOSIS — E871 Hypo-osmolality and hyponatremia: Secondary | ICD-10-CM | POA: Diagnosis not present

## 2017-02-16 DIAGNOSIS — C679 Malignant neoplasm of bladder, unspecified: Secondary | ICD-10-CM | POA: Insufficient documentation

## 2017-02-16 DIAGNOSIS — I509 Heart failure, unspecified: Secondary | ICD-10-CM | POA: Insufficient documentation

## 2017-02-16 DIAGNOSIS — M47816 Spondylosis without myelopathy or radiculopathy, lumbar region: Secondary | ICD-10-CM | POA: Diagnosis not present

## 2017-02-16 DIAGNOSIS — M4805 Spinal stenosis, thoracolumbar region: Secondary | ICD-10-CM | POA: Diagnosis not present

## 2017-02-16 DIAGNOSIS — I447 Left bundle-branch block, unspecified: Secondary | ICD-10-CM | POA: Insufficient documentation

## 2017-02-16 DIAGNOSIS — Z95810 Presence of automatic (implantable) cardiac defibrillator: Secondary | ICD-10-CM | POA: Diagnosis not present

## 2017-02-16 DIAGNOSIS — R002 Palpitations: Secondary | ICD-10-CM | POA: Insufficient documentation

## 2017-02-16 DIAGNOSIS — M48061 Spinal stenosis, lumbar region without neurogenic claudication: Secondary | ICD-10-CM | POA: Diagnosis not present

## 2017-02-16 DIAGNOSIS — Z8719 Personal history of other diseases of the digestive system: Secondary | ICD-10-CM | POA: Insufficient documentation

## 2017-02-16 DIAGNOSIS — I420 Dilated cardiomyopathy: Secondary | ICD-10-CM | POA: Diagnosis not present

## 2017-02-16 DIAGNOSIS — G8929 Other chronic pain: Secondary | ICD-10-CM | POA: Diagnosis present

## 2017-02-16 DIAGNOSIS — M47818 Spondylosis without myelopathy or radiculopathy, sacral and sacrococcygeal region: Secondary | ICD-10-CM

## 2017-02-16 DIAGNOSIS — M545 Low back pain: Secondary | ICD-10-CM | POA: Diagnosis not present

## 2017-02-16 DIAGNOSIS — M4126 Other idiopathic scoliosis, lumbar region: Secondary | ICD-10-CM | POA: Diagnosis not present

## 2017-02-16 DIAGNOSIS — G894 Chronic pain syndrome: Secondary | ICD-10-CM | POA: Diagnosis not present

## 2017-02-16 NOTE — Addendum Note (Signed)
Addended by: [name] on: 02/16/2017 01:47 PM   Modules accepted: Orders, Level of Service

## 2017-02-16 NOTE — Progress Notes (Signed)
Safety precautions to be maintained throughout the outpatient stay will include: orient to surroundings, keep bed in low position, maintain call bell within reach at all times, provide assistance with transfer out of bed and ambulation.  

## 2017-02-16 NOTE — Patient Instructions (Addendum)
1. Schedule for left sided ablation L3-L5 with sedation- next available 1-2 weeks 2. Follow up in January 7th (MON)  medication managementGENERAL RISKS AND COMPLICATIONS  What are the risk, side effects and possible complications? Generally speaking, most procedures are safe.  However, with any procedure there are risks, side effects, and the possibility of complications.  The risks and complications are dependent upon the sites that are lesioned, or the type of nerve block to be performed.  The closer the procedure is to the spine, the more serious the risks are.  Great care is taken when placing the radio frequency needles, block needles or lesioning probes, but sometimes complications can occur. 1. Infection: Any time there is an injection through the skin, there is a risk of infection.  This is why sterile conditions are used for these blocks.  There are four possible types of infection. 1. Localized skin infection. 2. Central Nervous System Infection-This can be in the form of Meningitis, which can be deadly. 3. Epidural Infections-This can be in the form of an epidural abscess, which can cause pressure inside of the spine, causing compression of the spinal cord with subsequent paralysis. This would require an emergency surgery to decompress, and there are no guarantees that the patient would recover from the paralysis. 4. Discitis-This is an infection of the intervertebral discs.  It occurs in about 1% of discography procedures.  It is difficult to treat and it may lead to surgery.        2. Pain: the needles have to go through skin and soft tissues, will cause soreness.       3. Damage to internal structures:  The nerves to be lesioned may be near blood vessels or    other nerves which can be potentially damaged.       4. Bleeding: Bleeding is more common if the patient is taking blood thinners such as  aspirin, Coumadin, Ticiid, Plavix, etc., or if he/she have some genetic predisposition  such  as hemophilia. Bleeding into the spinal canal can cause compression of the spinal  cord with subsequent paralysis.  This would require an emergency surgery to  decompress and there are no guarantees that the patient would recover from the  paralysis.       5. Pneumothorax:  Puncturing of a lung is a possibility, every time a needle is introduced in  the area of the chest or upper back.  Pneumothorax refers to free air around the  collapsed lung(s), inside of the thoracic cavity (chest cavity).  Another two possible  complications related to a similar event would include: Hemothorax and Chylothorax.   These are variations of the Pneumothorax, where instead of air around the collapsed  lung(s), you may have blood or chyle, respectively.       6. Spinal headaches: They may occur with any procedures in the area of the spine.       7. Persistent CSF (Cerebro-Spinal Fluid) leakage: This is a rare problem, but may occur  with prolonged intrathecal or epidural catheters either due to the formation of a fistulous  track or a dural tear.       8. Nerve damage: By working so close to the spinal cord, there is always a possibility of  nerve damage, which could be as serious as a permanent spinal cord injury with  paralysis.       9. Death:  Although rare, severe deadly allergic reactions known as "Anaphylactic  reaction" can occur to  any of the medications used.      10. Worsening of the symptoms:  We can always make thing worse.  What are the chances of something like this happening? Chances of any of this occuring are extremely low.  By statistics, you have more of a chance of getting killed in a motor vehicle accident: while driving to the hospital than any of the above occurring .  Nevertheless, you should be aware that they are possibilities.  In general, it is similar to taking a shower.  Everybody knows that you can slip, hit your head and get killed.  Does that mean that you should not shower again?   Nevertheless always keep in mind that statistics do not mean anything if you happen to be on the wrong side of them.  Even if a procedure has a 1 (one) in a 1,000,000 (million) chance of going wrong, it you happen to be that one..Also, keep in mind that by statistics, you have more of a chance of having something go wrong when taking medications.  Who should not have this procedure? If you are on a blood thinning medication (e.g. Coumadin, Plavix, see list of "Blood Thinners"), or if you have an active infection going on, you should not have the procedure.  If you are taking any blood thinners, please inform your physician.  How should I prepare for this procedure?  Do not eat or drink anything at least six hours prior to the procedure.  Bring a driver with you .  It cannot be a taxi.  Come accompanied by an adult that can drive you back, and that is strong enough to help you if your legs get weak or numb from the local anesthetic.  Take all of your medicines the morning of the procedure with just enough water to swallow them.  If you have diabetes, make sure that you are scheduled to have your procedure done first thing in the morning, whenever possible.  If you have diabetes, take only half of your insulin dose and notify our nurse that you have done so as soon as you arrive at the clinic.  If you are diabetic, but only take blood sugar pills (oral hypoglycemic), then do not take them on the morning of your procedure.  You may take them after you have had the procedure.  Do not take aspirin or any aspirin-containing medications, at least eleven (11) days prior to the procedure.  They may prolong bleeding.  Wear loose fitting clothing that may be easy to take off and that you would not mind if it got stained with Betadine or blood.  Do not wear any jewelry or perfume  Remove any nail coloring.  It will interfere with some of our monitoring equipment.  NOTE: Remember that this is not  meant to be interpreted as a complete list of all possible complications.  Unforeseen problems may occur.  BLOOD THINNERS The following drugs contain aspirin or other products, which can cause increased bleeding during surgery and should not be taken for 2 weeks prior to and 1 week after surgery.  If you should need take something for relief of minor pain, you may take acetaminophen which is found in Tylenol,m Datril, Anacin-3 and Panadol. It is not blood thinner. The products listed below are.  Do not take any of the products listed below in addition to any listed on your instruction sheet.  A.P.C or A.P.C with Codeine Codeine Phosphate Capsules #3 Ibuprofen Ridaura  ABC  compound Congesprin Imuran rimadil  Advil Cope Indocin Robaxisal  Alka-Seltzer Effervescent Pain Reliever and Antacid Coricidin or Coricidin-D  Indomethacin Rufen  Alka-Seltzer plus Cold Medicine Cosprin Ketoprofen S-A-C Tablets  Anacin Analgesic Tablets or Capsules Coumadin Korlgesic Salflex  Anacin Extra Strength Analgesic tablets or capsules CP-2 Tablets Lanoril Salicylate  Anaprox Cuprimine Capsules Levenox Salocol  Anexsia-D Dalteparin Magan Salsalate  Anodynos Darvon compound Magnesium Salicylate Sine-off  Ansaid Dasin Capsules Magsal Sodium Salicylate  Anturane Depen Capsules Marnal Soma  APF Arthritis pain formula Dewitt's Pills Measurin Stanback  Argesic Dia-Gesic Meclofenamic Sulfinpyrazone  Arthritis Bayer Timed Release Aspirin Diclofenac Meclomen Sulindac  Arthritis pain formula Anacin Dicumarol Medipren Supac  Analgesic (Safety coated) Arthralgen Diffunasal Mefanamic Suprofen  Arthritis Strength Bufferin Dihydrocodeine Mepro Compound Suprol  Arthropan liquid Dopirydamole Methcarbomol with Aspirin Synalgos  ASA tablets/Enseals Disalcid Micrainin Tagament  Ascriptin Doan's Midol Talwin  Ascriptin A/D Dolene Mobidin Tanderil  Ascriptin Extra Strength Dolobid Moblgesic Ticlid  Ascriptin with Codeine Doloprin or  Doloprin with Codeine Momentum Tolectin  Asperbuf Duoprin Mono-gesic Trendar  Aspergum Duradyne Motrin or Motrin IB Triminicin  Aspirin plain, buffered or enteric coated Durasal Myochrisine Trigesic  Aspirin Suppositories Easprin Nalfon Trillsate  Aspirin with Codeine Ecotrin Regular or Extra Strength Naprosyn Uracel  Atromid-S Efficin Naproxen Ursinus  Auranofin Capsules Elmiron Neocylate Vanquish  Axotal Emagrin Norgesic Verin  Azathioprine Empirin or Empirin with Codeine Normiflo Vitamin E  Azolid Emprazil Nuprin Voltaren  Bayer Aspirin plain, buffered or children's or timed BC Tablets or powders Encaprin Orgaran Warfarin Sodium  Buff-a-Comp Enoxaparin Orudis Zorpin  Buff-a-Comp with Codeine Equegesic Os-Cal-Gesic   Buffaprin Excedrin plain, buffered or Extra Strength Oxalid   Bufferin Arthritis Strength Feldene Oxphenbutazone   Bufferin plain or Extra Strength Feldene Capsules Oxycodone with Aspirin   Bufferin with Codeine Fenoprofen Fenoprofen Pabalate or Pabalate-SF   Buffets II Flogesic Panagesic   Buffinol plain or Extra Strength Florinal or Florinal with Codeine Panwarfarin   Buf-Tabs Flurbiprofen Penicillamine   Butalbital Compound Four-way cold tablets Penicillin   Butazolidin Fragmin Pepto-Bismol   Carbenicillin Geminisyn Percodan   Carna Arthritis Reliever Geopen Persantine   Carprofen Gold's salt Persistin   Chloramphenicol Goody's Phenylbutazone   Chloromycetin Haltrain Piroxlcam   Clmetidine heparin Plaquenil   Cllnoril Hyco-pap Ponstel   Clofibrate Hydroxy chloroquine Propoxyphen         Before stopping any of these medications, be sure to consult the physician who ordered them.  Some, such as Coumadin (Warfarin) are ordered to prevent or treat serious conditions such as "deep thrombosis", "pumonary embolisms", and other heart problems.  The amount of time that you may need off of the medication may also vary with the medication and the reason for which you were  taking it.  If you are taking any of these medications, please make sure you notify your pain physician before you undergo any procedures.         Radiofrequency Lesioning Radiofrequency lesioning is a procedure that is performed to relieve pain. The procedure is often used for back, neck, or arm pain. Radiofrequency lesioning involves the use of a machine that creates radio waves to make heat. During the procedure, the heat is applied to the nerve that carries the pain signal. The heat damages the nerve and interferes with the pain signal. Pain relief usually starts about 2 weeks after the procedure and lasts for 6 months to 1 year. Tell a health care provider about:  Any allergies you have.  All medicines you are taking, including  vitamins, herbs, eye drops, creams, and over-the-counter medicines.  Any problems you or family members have had with anesthetic medicines.  Any blood disorders you have.  Any surgeries you have had.  Any medical conditions you have.  Whether you are pregnant or may be pregnant. What are the risks? Generally, this is a safe procedure. However, problems may occur, including:  Pain or soreness at the injection site.  Infection at the injection site.  Damage to nerves or blood vessels.  What happens before the procedure?  Ask your health care provider about: ? Changing or stopping your regular medicines. This is especially important if you are taking diabetes medicines or blood thinners. ? Taking medicines such as aspirin and ibuprofen. These medicines can thin your blood. Do not take these medicines before your procedure if your health care provider instructs you not to.  Follow instructions from your health care provider about eating or drinking restrictions.  Plan to have someone take you home after the procedure.  If you go home right after the procedure, plan to have someone with you for 24 hours. What happens during the procedure?  You  will be given one or more of the following: ? A medicine to help you relax (sedative). ? A medicine to numb the area (local anesthetic).  You will be awake during the procedure. You will need to be able to talk with the health care provider during the procedure.  With the help of a type of X-ray (fluoroscopy), the health care provider will insert a radiofrequency needle into the area to be treated.  Next, a wire that carries the radio waves (electrode) will be put through the radiofrequency needle. An electrical pulse will be sent through the electrode to verify the correct nerve. You will feel a tingling sensation, and you may have muscle twitching.  Then, the tissue that is around the needle tip will be heated by an electric current that is passed using the radiofrequency machine. This will numb the nerves.  A bandage (dressing) will be put on the insertion area after the procedure is done. The procedure may vary among health care providers and hospitals. What happens after the procedure?  Your blood pressure, heart rate, breathing rate, and blood oxygen level will be monitored often until the medicines you were given have worn off.  Return to your normal activities as directed by your health care provider. This information is not intended to replace advice given to you by your health care provider. Make sure you discuss any questions you have with your health care provider. Document Released: 11/03/2010 Document Revised: 08/13/2015 Document Reviewed: 04/14/2014 [employer] Interactive Patient Education  [employer].

## 2017-02-16 NOTE — Progress Notes (Signed)
Patient's Name: [name]  MRN: [id number]  Referring Provider: [name], MD  DOB: May 26, 1940  PCP: [name], MD  DOS: 02/16/2017  Note by: [name], MD  Service setting: Ambulatory outpatient  Specialty: Interventional Pain Management  Location: [facility] (AMB) Pain Management Facility    Patient type: Established   Primary Reason(s) for Visit: Encounter for post-procedure evaluation of chronic illness with mild to moderate exacerbation CC: Back Pain (lower)  HPI  [name] is a 76 y.o. year old, female patient, who comes today for a post-procedure evaluation. She has Palpitations; Hyponatremia; Bladder cancer ([place]); LBBB (left bundle branch block); Atrial ectopy; Congestive dilated cardiomyopathy ([place]); History of gastric ulcer; Spinal stenosis of thoracolumbar region; COPD (chronic obstructive pulmonary disease) ([place]); Constipation; Spinal stenosis in cervical region; Chronic bilateral low back pain; NSAID induced gastritis; Parkinsonian features; Hypertension; Spinal stenosis of lumbar region without neurogenic claudication; and Other idiopathic scoliosis, lumbar region on their problem list. Her primarily concern today is the Back Pain (lower)  Pain Assessment: Location: Lower Back Radiating: both hips Onset: More than a month ago Duration: Chronic pain Quality: Aching Severity: 2 /10 (self-reported pain score)  Note: Reported level is compatible with observation.                         When using our objective Pain Scale, levels between 6 and 10/10 are said to belong in an emergency room, as it progressively worsens from a 6/10, described as severely limiting, requiring emergency care not usually available at an outpatient pain management facility. At a 6/10 level, communication becomes difficult and requires great effort. Assistance to reach the emergency department may be required. Facial flushing and profuse sweating along with potentially dangerous increases in heart rate  and blood pressure will be evident. Effect on ADL:   Timing: Constant Modifying factors: sitting for short periods of time, lying down  [name] comes in today for post-procedure evaluation after the treatment done on 01/25/2017.  Further details on both, my assessment(s), as well as the proposed treatment plan, please see below.  Post-Procedure Assessment  01/25/2017 Procedure: Bilateral SI joint injection, bilateral L3/L4/L5 medial branch nerve blocks #2 Pre-procedure pain score:  5/10 Post-procedure pain score: 0/10         Influential Factors: BMI: 20.98 kg/m Intra-procedural challenges: None observed.         Assessment challenges: None detected.              Reported side-effects: None.        Post-procedural adverse reactions or complications: None reported         Sedation: Please see nurses note. When no sedatives are used, the analgesic levels obtained are directly associated to the effectiveness of the local anesthetics. However, when sedation is provided, the level of analgesia obtained during the initial 1 hour following the intervention, is believed to be the result of a combination of factors. These factors may include, but are not limited to: 1. The effectiveness of the local anesthetics used. 2. The effects of the analgesic(s) and/or anxiolytic(s) used. 3. The degree of discomfort experienced by the patient at the time of the procedure. 4. The patients ability and reliability in recalling and recording the events. 5. The presence and influence of possible secondary gains and/or psychosocial factors. Reported result: Relief experienced during the 1st hour after the procedure: 100 % (Ultra-Short Term Relief)  Interpretative annotation: Clinically appropriate result. Analgesia during this period is likely to be Local Anesthetic and/or IV Sedative (Analgesic/Anxiolytic) related.          Effects of local anesthetic: The analgesic effects attained during this period  are directly associated to the localized infiltration of local anesthetics and therefore cary significant diagnostic value as to the etiological location, or anatomical origin, of the pain. Expected duration of relief is directly dependent on the pharmacodynamics of the local anesthetic used. Long-acting (4-6 hours) anesthetics used.  Reported result: Relief during the next 4 to 6 hour after the procedure: 50%(Short-Term Relief)            Interpretative annotation: Clinically appropriate result. Analgesia during this period is likely to be Local Anesthetic-related.          Long-term benefit: Defined as the period of time past the expected duration of local anesthetics (1 hour for short-acting and 4-6 hours for long-acting). With the possible exception of prolonged sympathetic blockade from the local anesthetics, benefits during this period are typically attributed to, or associated with, other factors such as analgesic sensory neuropraxia, antiinflammatory effects, or beneficial biochemical changes provided by agents other than the local anesthetics.  Reported result: Extended relief following procedure: 50% Long-Term Relief)            Interpretative annotation: Clinically appropriate result. Good relief. No permanent benefit expected. Limited inflammation. Possible mechanical aggravating factors.          Current benefits: Defined as reported results that persistent at this point in time.   Analgesia: <25 %            Function: Back to baseline ROM: Back to baseline Interpretative annotation: Recurrence of symptoms. Limited therapeutic benefit. Effective diagnostic intervention.          Interpretation: Results would suggest a successful diagnostic and therapeutic intervention.                  Patient has ongoing benefit from SI joint injections and notes less hip pain and buttock pain as a result.  In regards to her lumbar facet blocks, bilaterally, patient notes approximately 50-60%  improvement in her axial low back pain symptoms especially with lumbar extension during the first 48 hours after her medial branch nerve block.  Just an effective diagnostic block proceed with radiofrequency ablation starting with the left side first.  Plan:  Proceed with Radiofrequency Ablation for the purpose of attaining long-term benefits.        Laboratory Chemistry  Inflammation Markers (CRP: Acute Phase) (ESR: Chronic Phase) No results found for: CRP, ESRSEDRATE, LATICACIDVEN               Rheumatology Markers No results found for: RF, ANA, Therisa [place], [facility]              Renal Function Markers Lab Results  Component Value Date   BUN 10 11/02/2016   CREATININE 0.69 11/02/2016   GFRAA >60 10/02/2015   GFRNONAA >60 10/02/2015                 Hepatic Function Markers Lab Results  Component Value Date   AST 23 05/24/2016   ALT 15 05/24/2016   ALBUMIN 4.7 05/24/2016   ALKPHOS 95 05/24/2016   LIPASE 30 11/23/2013                 Electrolytes Lab Results  Component Value Date   NA 133 (L) 11/02/2016   K 4.2  11/02/2016   CL 98 11/02/2016   CALCIUM 10.1 11/02/2016                 Neuropathy Markers Lab Results  Component Value Date   VITAMINB12 1,069 (H) 12/27/2013   HGBA1C 4.6 12/27/2013                 Bone Pathology Markers No results found for: VD25OH, [id number], XV4008QP6, PP5093OI7, 25OHVITD1, 25OHVITD2, 25OHVITD3, TESTOFREE, TESTOSTERONE               Coagulation Parameters Lab Results  Component Value Date   INR 1.0 04/28/2014   LABPROT 10.3 04/28/2014   APTT 27 11/22/2013   PLT 209 10/02/2015                 Cardiovascular Markers Lab Results  Component Value Date   CKTOTAL 87 09/01/2010   CKMB 4.1 (H) 09/01/2010   TROPONINI <0.30 11/23/2013   HGB 13.5 10/02/2015   HCT 40.4 10/02/2015                 CA Markers No results found for: CEA, CA125, LABCA2               Note: Lab results reviewed.   Meds    Current Outpatient Medications:  .  amLODipine (NORVASC) 10 MG tablet, TAKE 1/2 TABLET(5MG) BY MOUTH TWICE DAILY**NOTE DOSE**, Disp: 30 tablet, Rfl: 0 .  cyclobenzaprine (FLEXERIL) 5 MG tablet, TAKE 1 TABLET(5 MG) BY MOUTH THREE TIMES DAILY AS NEEDED FOR MUSCLE SPASMS, Disp: 90 tablet, Rfl: 0 .  DULoxetine (CYMBALTA) 30 MG capsule, TAKE 1 CAPSULE(30 MG) BY MOUTH DAILY, Disp: 30 capsule, Rfl: 0 .  gabapentin (NEURONTIN) 300 MG capsule, TAKE 1 CAPSULE BY MOUTH TWICE DAILY EVERY NIGHT AT BEDTIME AS DIRECTED, Disp: 60 capsule, Rfl: 0 .  HYDROcodone-acetaminophen (NORCO) 10-325 MG tablet, Take 1 tablet by mouth every 4 (four) hours as needed for severe pain (Pain)., Disp: 180 tablet, Rfl: 0 .  lisinopril (PRINIVIL,ZESTRIL) 40 MG tablet, TAKE 1 TABLET(40 MG) BY MOUTH DAILY, Disp: 30 tablet, Rfl: 0 .  metoprolol succinate (TOPROL-XL) 25 MG 24 hr tablet, Take 1 tablet (25 mg total) by mouth daily., Disp: 30 tablet, Rfl: 3 .  pantoprazole (PROTONIX) 40 MG tablet, TAKE 1 TABLET(40 MG) BY MOUTH TWICE DAILY, Disp: 60 tablet, Rfl: 2 .  polyethylene glycol (MIRALAX / GLYCOLAX) packet, Take 17 g by mouth daily as needed for mild constipation. , Disp: , Rfl:  .  promethazine (PHENERGAN) 12.5 MG tablet, TAKE 1 TABLET BY MOUTH EVERY 8 HOURS AS NEEDED FOR NAUSEA OR VOMITING, Disp: 60 tablet, Rfl: 0 .  spironolactone (ALDACTONE) 25 MG tablet, TAKE 1 TABLET BY MOUTH DAILY, Disp: 30 tablet, Rfl: 5 .  sucralfate (CARAFATE) 1 g tablet, TAKE 1 TABLET BY MOUTH FOUR TIMES DAILY EVERY NIGHT AT BEDTIME WITH MEALS, Disp: 120 tablet, Rfl: 0  ROS  Constitutional: Denies any fever or chills Gastrointestinal: No reported hemesis, hematochezia, vomiting, or acute GI distress Musculoskeletal: Denies any acute onset joint swelling, redness, loss of ROM, or weakness Neurological: No reported episodes of acute onset apraxia, aphasia, dysarthria, agnosia, amnesia, paralysis, loss of coordination, or loss of consciousness  Allergies   [name] has No Known Allergies.  [place]  Drug: [name]  reports that she does not use drugs. Alcohol:  reports that she does not drink alcohol. Tobacco:  reports that she has been smoking cigarettes.  She has a 27.50 pack-year smoking history. she  has never used smokeless tobacco. Medical:  has a past medical history of AICD (automatic cardioverter/defibrillator) present, Anemia, Arthritis, Bladder cancer ([place]) (1995; 2015), CHF (congestive heart failure) ([place]), Chronic lower back pain, COPD (chronic obstructive pulmonary disease) ([place]), Ectopic atrial beats, Frequent urination, GERD (gastroesophageal reflux disease), History of stomach ulcers (11/2013), Hypertension, LBBB (left bundle branch block), and Migraines. Surgical: [name]  has a past surgical history that includes Esophagogastroduodenoscopy (N/A, 11/28/2013); Back surgery; Transurethral resection of bladder tumor (N/A, 12/13/2013); Transurethral resection of bladder tumor (N/A, 01/17/2014); Cataract extraction w/ intraocular lens  implant, bilateral (Bilateral, 12-2013 - [id number]); Cystoscopy with retrograde pyelogram, ureteroscopy and stent placement (Bilateral, 04/18/2014); Transurethral resection of bladder tumor (N/A, 04/18/2014); Cardiac defibrillator placement (05/07/2014); Lumbar disc surgery (1980's); Cardiac catheterization (~ 02/2014); bi-ventricular implantable cardioverter defibrillator (N/A, 05/07/2014); Cystoscopy w/ retrogrades (Bilateral, 01/06/2015); and Cystoscopy with biopsy (N/A, 10/06/2015). Family: family history includes Cancer in her brother and mother.  Constitutional Exam  General appearance: Well nourished, well developed, and well hydrated. In no apparent acute distress Vitals:   02/16/17 1322  BP: (!) 144/78  Pulse: (!) 46  Resp: 18  Temp: (!) 97.5 F (36.4 C)  TempSrc: Oral  SpO2: 91%  Weight: 138 lb (62.6 kg)  Height: 5' 8"  (1.727 m)   BMI Assessment: Estimated body mass index is 20.98 kg/m as calculated  from the following:   Height as of this encounter: 5' 8"  (1.727 m).   Weight as of this encounter: 138 lb (62.6 kg).  BMI interpretation table: BMI level Category Range association with higher incidence of chronic pain  <18 kg/m2 Underweight   18.5-24.9 kg/m2 Ideal body weight   25-29.9 kg/m2 Overweight Increased incidence by 20%  30-34.9 kg/m2 Obese (Class I) Increased incidence by 68%  35-39.9 kg/m2 Severe obesity (Class II) Increased incidence by 136%  >40 kg/m2 Extreme obesity (Class III) Increased incidence by 254%   BMI Readings from Last 4 Encounters:  02/16/17 20.98 kg/m  02/07/17 20.98 kg/m  01/25/17 20.53 kg/m  01/17/17 20.53 kg/m   Wt Readings from Last 4 Encounters:  02/16/17 138 lb (62.6 kg)  02/07/17 138 lb (62.6 kg)  01/25/17 135 lb (61.2 kg)  01/17/17 135 lb (61.2 kg)  Psych/Mental status: Alert, oriented x 3 (person, place, & time)       Eyes: PERLA Respiratory: No evidence of acute respiratory distress  Cervical Spine Area Exam  Skin & Axial Inspection: No masses, redness, edema, swelling, or associated skin lesions Alignment: Symmetrical Functional ROM: Unrestricted ROM      Stability: No instability detected Muscle Tone/Strength: Functionally intact. No obvious neuro-muscular anomalies detected. Sensory (Neurological): Unimpaired Palpation: No palpable anomalies              Upper Extremity (UE) Exam    Side: Right upper extremity  Side: Left upper extremity  Skin & Extremity Inspection: Skin color, temperature, and hair growth are WNL. No peripheral edema or cyanosis. No masses, redness, swelling, asymmetry, or associated skin lesions. No contractures.  Skin & Extremity Inspection: Skin color, temperature, and hair growth are WNL. No peripheral edema or cyanosis. No masses, redness, swelling, asymmetry, or associated skin lesions. No contractures.  Functional ROM: Unrestricted ROM          Functional ROM: Unrestricted ROM          Muscle  Tone/Strength: Functionally intact. No obvious neuro-muscular anomalies detected.  Muscle Tone/Strength: Functionally intact. No obvious neuro-muscular anomalies detected.  Sensory (Neurological): Unimpaired  Sensory (Neurological): Unimpaired          Palpation: No palpable anomalies              Palpation: No palpable anomalies              Specialized Test(s): Deferred         Specialized Test(s): Deferred          Thoracic Spine Area Exam  Skin & Axial Inspection: No masses, redness, or swelling Alignment: Symmetrical Functional ROM: Unrestricted ROM Stability: No instability detected Muscle Tone/Strength: Functionally intact. No obvious neuro-muscular anomalies detected. Sensory (Neurological): Unimpaired Muscle strength & Tone: No palpable anomalies  Lumbar Spine Area Exam  Skin & Axial Inspection: No masses, redness, or swelling Alignment: Symmetrical Functional ROM: Unrestricted ROM      Stability: No instability detected Muscle Tone/Strength: Functionally intact. No obvious neuro-muscular anomalies detected. Sensory (Neurological): Unimpaired Palpation: No palpable anomalies       Provocative Tests: Lumbar Hyperextension and rotation test: Improved after treatment       Lumbar Lateral bending test: evaluation deferred today       Patrick's Maneuver: evaluation deferred today                    Gait & Posture Assessment  Ambulation: Unassisted Gait: Relatively normal for age and body habitus Posture: WNL   Lower Extremity Exam    Side: Right lower extremity  Side: Left lower extremity  Skin & Extremity Inspection: Skin color, temperature, and hair growth are WNL. No peripheral edema or cyanosis. No masses, redness, swelling, asymmetry, or associated skin lesions. No contractures.  Skin & Extremity Inspection: Skin color, temperature, and hair growth are WNL. No peripheral edema or cyanosis. No masses, redness, swelling, asymmetry, or associated skin lesions. No  contractures.  Functional ROM: Unrestricted ROM          Functional ROM: Unrestricted ROM          Muscle Tone/Strength: Functionally intact. No obvious neuro-muscular anomalies detected.  Muscle Tone/Strength: Functionally intact. No obvious neuro-muscular anomalies detected.  Sensory (Neurological): Unimpaired  Sensory (Neurological): Unimpaired  Palpation: No palpable anomalies  Palpation: No palpable anomalies   Assessment  Primary Diagnosis & Pertinent Problem List: The primary encounter diagnosis was Lumbar spondylosis. 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  Status Diagnosis  Controlled Controlled Controlled 1. Lumbar spondylosis   2. 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 Lumbar degenerative disc disease   4. Chronic pain syndrome      76 year old female with a history of lumbar spondylosis and SI joint arthritis who presents for follow-up status post bilateral L3-L5 medial branch nerve blocks along with bilateral SI joint injections.  Patient notes improvement in hip pain following the SI joint injections.  She also states that her axial low back pain that radiates into her bilateral buttocks was improved for approximately 48 hours after her bilateral lumbar facet block suggesting a positive block.  We discussed radiofrequency ablation patient would like to proceed.  We will start with the left side first followed by the right side.  Plan: -Left L3-L5 RFA followed by the right.  This will be done under sedation. -Follow-up in 1 month for medication management -Continue all medications as currently prescribed.  Lab-work, procedure(s), and/or referral(s): Orders Placed This Encounter  Procedures  . Radiofrequency,Lumbar    Provider-requested follow-up: No Follow-up on file.  Future Appointments  Date Time Provider [place]  03/22/2017 11:45 AM Deboraha Sprang, MD [facility]  03/24/2017  1:30 PM [name], MD [facility]  03/27/2017  8:00 AM [name], MD [facility] None  04/06/2017  1:45 PM [name], MD [facility] None  05/16/2017  3:00 PM O'Brien-Blaney, Bryson Corona, LPN [facility]    Primary Care Physician: [name], MD Location: [facility] Note by: [name], M.D Date: 02/16/2017; Time: 3:54 PM  Patient Instructions   1. Schedule for left sided ablation L3-L5 with sedation- next available 1-2 weeks 2. Follow up in January 7th (MON)  medication managementGENERAL RISKS AND COMPLICATIONS  What are the risk, side effects and possible complications? Generally speaking, most procedures are safe.  However, with any procedure there are risks, side effects, and the possibility of complications.  The risks and complications are dependent upon the sites that are lesioned, or the type of nerve block to be performed.  The closer the procedure is to the spine, the more serious the risks are.  Great care is taken when placing the radio frequency needles, block needles or lesioning probes, but sometimes complications can occur. 1. Infection: Any time there is an injection through the skin, there is a risk of infection.  This is why sterile conditions are used for these blocks.  There are four possible types of infection. 1. Localized skin infection. 2. Central Nervous System Infection-This can be in the form of Meningitis, which can be deadly. 3. Epidural Infections-This can be in the form of an epidural abscess, which can cause pressure inside of the spine, causing compression of the spinal cord with subsequent paralysis. This would require an emergency surgery to decompress, and there are no guarantees that the patient would recover from the paralysis. 4. Discitis-This is an infection of the intervertebral discs.  It occurs in about 1% of discography procedures.  It is difficult to treat and it may lead to  surgery.        2. Pain: the needles have to go through skin and soft tissues, will cause soreness.       3. Damage to internal structures:  The nerves to be lesioned may be near blood vessels or    other nerves which can be potentially damaged.       4. Bleeding: Bleeding is more common if the patient is taking blood thinners such as  aspirin, Coumadin, Ticiid, Plavix, etc., or if he/she have some genetic predisposition  such as hemophilia. Bleeding into the spinal canal can cause compression of the spinal  cord with subsequent paralysis.  This would require an emergency surgery to  decompress and there are no guarantees that the patient would recover from the  paralysis.       5. Pneumothorax:  Puncturing of a lung is a possibility, every time a needle is introduced in  the area of the chest or upper back.  Pneumothorax refers to free air around the  collapsed lung(s), inside of the thoracic cavity (chest cavity).  Another two possible  complications related to a similar event would include: Hemothorax and Chylothorax.   These are variations of the Pneumothorax, where instead of air around the collapsed  lung(s), you may have blood or chyle, respectively.       6. Spinal headaches: They may occur with any procedures in the area of the spine.       7. Persistent CSF (Cerebro-Spinal Fluid) leakage: This is a  rare problem, but may occur  with prolonged intrathecal or epidural catheters either due to the formation of a fistulous  track or a dural tear.       8. Nerve damage: By working so close to the spinal cord, there is always a possibility of  nerve damage, which could be as serious as a permanent spinal cord injury with  paralysis.       9. Death:  Although rare, severe deadly allergic reactions known as "Anaphylactic  reaction" can occur to any of the medications used.      10. Worsening of the symptoms:  We can always make thing worse.  What are the chances of something like this  happening? Chances of any of this occuring are extremely low.  By statistics, you have more of a chance of getting killed in a motor vehicle accident: while driving to the hospital than any of the above occurring .  Nevertheless, you should be aware that they are possibilities.  In general, it is similar to taking a shower.  Everybody knows that you can slip, hit your head and get killed.  Does that mean that you should not shower again?  Nevertheless always keep in mind that statistics do not mean anything if you happen to be on the wrong side of them.  Even if a procedure has a 1 (one) in a 1,000,000 (million) chance of going wrong, it you happen to be that one..Also, keep in mind that by statistics, you have more of a chance of having something go wrong when taking medications.  Who should not have this procedure? If you are on a blood thinning medication (e.g. Coumadin, Plavix, see list of "Blood Thinners"), or if you have an active infection going on, you should not have the procedure.  If you are taking any blood thinners, please inform your physician.  How should I prepare for this procedure?  Do not eat or drink anything at least six hours prior to the procedure.  Bring a driver with you .  It cannot be a taxi.  Come accompanied by an adult that can drive you back, and that is strong enough to help you if your legs get weak or numb from the local anesthetic.  Take all of your medicines the morning of the procedure with just enough water to swallow them.  If you have diabetes, make sure that you are scheduled to have your procedure done first thing in the morning, whenever possible.  If you have diabetes, take only half of your insulin dose and notify our nurse that you have done so as soon as you arrive at the clinic.  If you are diabetic, but only take blood sugar pills (oral hypoglycemic), then do not take them on the morning of your procedure.  You may take them after you have had the  procedure.  Do not take aspirin or any aspirin-containing medications, at least eleven (11) days prior to the procedure.  They may prolong bleeding.  Wear loose fitting clothing that may be easy to take off and that you would not mind if it got stained with Betadine or blood.  Do not wear any jewelry or perfume  Remove any nail coloring.  It will interfere with some of our monitoring equipment.  NOTE: Remember that this is not meant to be interpreted as a complete list of all possible complications.  Unforeseen problems may occur.  BLOOD THINNERS The following drugs contain aspirin or other products, which  can cause increased bleeding during surgery and should not be taken for 2 weeks prior to and 1 week after surgery.  If you should need take something for relief of minor pain, you may take acetaminophen which is found in Tylenol,m Datril, Anacin-3 and Panadol. It is not blood thinner. The products listed below are.  Do not take any of the products listed below in addition to any listed on your instruction sheet.  A.P.C or A.P.C with Codeine Codeine Phosphate Capsules #3 Ibuprofen Ridaura  ABC compound Congesprin Imuran rimadil  Advil Cope Indocin Robaxisal  Alka-Seltzer Effervescent Pain Reliever and Antacid Coricidin or Coricidin-D  Indomethacin Rufen  Alka-Seltzer plus Cold Medicine Cosprin Ketoprofen S-A-C Tablets  Anacin Analgesic Tablets or Capsules Coumadin Korlgesic Salflex  Anacin Extra Strength Analgesic tablets or capsules CP-2 Tablets Lanoril Salicylate  Anaprox Cuprimine Capsules Levenox Salocol  Anexsia-D Dalteparin Magan Salsalate  Anodynos Darvon compound Magnesium Salicylate Sine-off  Ansaid Dasin Capsules Magsal Sodium Salicylate  Anturane Depen Capsules Marnal Soma  APF Arthritis pain formula Dewitt's Pills Measurin Stanback  Argesic Dia-Gesic Meclofenamic Sulfinpyrazone  Arthritis Bayer Timed Release Aspirin Diclofenac Meclomen Sulindac  Arthritis pain formula  Anacin Dicumarol Medipren Supac  Analgesic (Safety coated) Arthralgen Diffunasal Mefanamic Suprofen  Arthritis Strength Bufferin Dihydrocodeine Mepro Compound Suprol  Arthropan liquid Dopirydamole Methcarbomol with Aspirin Synalgos  ASA tablets/Enseals Disalcid Micrainin Tagament  Ascriptin Doan's Midol Talwin  Ascriptin A/D Dolene Mobidin Tanderil  Ascriptin Extra Strength Dolobid Moblgesic Ticlid  Ascriptin with Codeine Doloprin or Doloprin with Codeine Momentum Tolectin  Asperbuf Duoprin Mono-gesic Trendar  Aspergum Duradyne Motrin or Motrin IB Triminicin  Aspirin plain, buffered or enteric coated Durasal Myochrisine Trigesic  Aspirin Suppositories Easprin Nalfon Trillsate  Aspirin with Codeine Ecotrin Regular or Extra Strength Naprosyn Uracel  Atromid-S Efficin Naproxen Ursinus  Auranofin Capsules Elmiron Neocylate Vanquish  Axotal Emagrin Norgesic Verin  Azathioprine Empirin or Empirin with Codeine Normiflo Vitamin E  Azolid Emprazil Nuprin Voltaren  Bayer Aspirin plain, buffered or children's or timed BC Tablets or powders Encaprin Orgaran Warfarin Sodium  Buff-a-Comp Enoxaparin Orudis Zorpin  Buff-a-Comp with Codeine Equegesic Os-Cal-Gesic   Buffaprin Excedrin plain, buffered or Extra Strength Oxalid   Bufferin Arthritis Strength Feldene Oxphenbutazone   Bufferin plain or Extra Strength Feldene Capsules Oxycodone with Aspirin   Bufferin with Codeine Fenoprofen Fenoprofen Pabalate or Pabalate-SF   Buffets II Flogesic Panagesic   Buffinol plain or Extra Strength Florinal or Florinal with Codeine Panwarfarin   Buf-Tabs Flurbiprofen Penicillamine   Butalbital Compound Four-way cold tablets Penicillin   Butazolidin Fragmin Pepto-Bismol   Carbenicillin Geminisyn Percodan   Carna Arthritis Reliever Geopen Persantine   Carprofen Gold's salt Persistin   Chloramphenicol Goody's Phenylbutazone   Chloromycetin Haltrain Piroxlcam   Clmetidine heparin Plaquenil   Cllnoril Hyco-pap  Ponstel   Clofibrate Hydroxy chloroquine Propoxyphen         Before stopping any of these medications, be sure to consult the physician who ordered them.  Some, such as Coumadin (Warfarin) are ordered to prevent or treat serious conditions such as "deep thrombosis", "pumonary embolisms", and other heart problems.  The amount of time that you may need off of the medication may also vary with the medication and the reason for which you were taking it.  If you are taking any of these medications, please make sure you notify your pain physician before you undergo any procedures.         Radiofrequency Lesioning Radiofrequency lesioning is a procedure that is performed to relieve  pain. The procedure is often used for back, neck, or arm pain. Radiofrequency lesioning involves the use of a machine that creates radio waves to make heat. During the procedure, the heat is applied to the nerve that carries the pain signal. The heat damages the nerve and interferes with the pain signal. Pain relief usually starts about 2 weeks after the procedure and lasts for 6 months to 1 year. Tell a health care provider about:  Any allergies you have.  All medicines you are taking, including vitamins, herbs, eye drops, creams, and over-the-counter medicines.  Any problems you or family members have had with anesthetic medicines.  Any blood disorders you have.  Any surgeries you have had.  Any medical conditions you have.  Whether you are pregnant or may be pregnant. What are the risks? Generally, this is a safe procedure. However, problems may occur, including:  Pain or soreness at the injection site.  Infection at the injection site.  Damage to nerves or blood vessels.  What happens before the procedure?  Ask your health care provider about: ? Changing or stopping your regular medicines. This is especially important if you are taking diabetes medicines or blood thinners. ? Taking medicines such as  aspirin and ibuprofen. These medicines can thin your blood. Do not take these medicines before your procedure if your health care provider instructs you not to.  Follow instructions from your health care provider about eating or drinking restrictions.  Plan to have someone take you home after the procedure.  If you go home right after the procedure, plan to have someone with you for 24 hours. What happens during the procedure?  You will be given one or more of the following: ? A medicine to help you relax (sedative). ? A medicine to numb the area (local anesthetic).  You will be awake during the procedure. You will need to be able to talk with the health care provider during the procedure.  With the help of a type of X-ray (fluoroscopy), the health care provider will insert a radiofrequency needle into the area to be treated.  Next, a wire that carries the radio waves (electrode) will be put through the radiofrequency needle. An electrical pulse will be sent through the electrode to verify the correct nerve. You will feel a tingling sensation, and you may have muscle twitching.  Then, the tissue that is around the needle tip will be heated by an electric current that is passed using the radiofrequency machine. This will numb the nerves.  A bandage (dressing) will be put on the insertion area after the procedure is done. The procedure may vary among health care providers and hospitals. What happens after the procedure?  Your blood pressure, heart rate, breathing rate, and blood oxygen level will be monitored often until the medicines you were given have worn off.  Return to your normal activities as directed by your health care provider. This information is not intended to replace advice given to you by your health care provider. Make sure you discuss any questions you have with your health care provider. Document Released: 11/03/2010 Document Revised: 08/13/2015 Document Reviewed:  04/14/2014 Elsevier Interactive Patient Education  [employer].

## 2017-03-08 DIAGNOSIS — C672 Malignant neoplasm of lateral wall of bladder: Secondary | ICD-10-CM | POA: Diagnosis not present

## 2017-03-10 ENCOUNTER — Telehealth: Payer: Self-pay | Admitting: Internal Medicine

## 2017-03-10 NOTE — Telephone Encounter (Signed)
This is already listed on patients pharmacy preference list.

## 2017-03-10 NOTE — Telephone Encounter (Signed)
New Message  Pt call to inform she has moved to the [place] area and would like to change her pharmacy to [facility] on [address] in [place]. Please call back to discuss if needed

## 2017-03-15 ENCOUNTER — Other Ambulatory Visit: Payer: Self-pay

## 2017-03-15 ENCOUNTER — Other Ambulatory Visit: Payer: Self-pay | Admitting: Internal Medicine

## 2017-03-15 DIAGNOSIS — I420 Dilated cardiomyopathy: Secondary | ICD-10-CM

## 2017-03-15 MED ORDER — METOPROLOL SUCCINATE ER 25 MG PO TB24: 25.0000 mg | ORAL_TABLET | Freq: Every day | ORAL | 10 refills | Status: DC

## 2017-03-22 ENCOUNTER — Encounter: Payer: Medicare Other | Admitting: Internal Medicine

## 2017-03-24 ENCOUNTER — Ambulatory Visit (INDEPENDENT_AMBULATORY_CARE_PROVIDER_SITE_OTHER): Payer: Medicare Other | Admitting: Internal Medicine

## 2017-03-24 ENCOUNTER — Encounter: Payer: Self-pay | Admitting: Internal Medicine

## 2017-03-24 VITALS — BP 130/86 | HR 76 | Temp 97.8°F | Resp 14 | Ht 68.0 in | Wt 134.4 lb

## 2017-03-24 DIAGNOSIS — I2510 Atherosclerotic heart disease of native coronary artery without angina pectoris: Secondary | ICD-10-CM

## 2017-03-24 DIAGNOSIS — E782 Mixed hyperlipidemia: Secondary | ICD-10-CM | POA: Diagnosis not present

## 2017-03-24 DIAGNOSIS — E871 Hypo-osmolality and hyponatremia: Secondary | ICD-10-CM

## 2017-03-24 DIAGNOSIS — E785 Hyperlipidemia, unspecified: Secondary | ICD-10-CM

## 2017-03-24 DIAGNOSIS — I10 Essential (primary) hypertension: Secondary | ICD-10-CM | POA: Diagnosis not present

## 2017-03-24 DIAGNOSIS — M4805 Spinal stenosis, thoracolumbar region: Secondary | ICD-10-CM

## 2017-03-24 DIAGNOSIS — Z950 Presence of cardiac pacemaker: Secondary | ICD-10-CM

## 2017-03-24 LAB — COMPREHENSIVE METABOLIC PANEL
ALK PHOS: 105 U/L (ref 39–117)
ALT: 17 U/L (ref 0–35)
AST: 25 U/L (ref 0–37)
Albumin: 4.4 g/dL (ref 3.5–5.2)
BUN: 11 mg/dL (ref 6–23)
CHLORIDE: 99 meq/L (ref 96–112)
CO2: 29 mEq/L (ref 19–32)
Calcium: 9.9 mg/dL (ref 8.4–10.5)
Creatinine, Ser: 0.73 mg/dL (ref 0.40–1.20)
GFR: 81.95 mL/min (ref >60.00)
Glucose, Bld: 109 mg/dL — ABNORMAL HIGH (ref 70–99)
POTASSIUM: 4.9 meq/L (ref 3.5–5.1)
Sodium: 132 mEq/L — ABNORMAL LOW (ref 135–145)
TOTAL PROTEIN: 6.9 g/dL (ref 6.0–8.3)
Total Bilirubin: 0.4 mg/dL (ref 0.2–1.2)

## 2017-03-24 LAB — LIPID PANEL
CHOL/HDL RATIO: 4
Cholesterol: 188 mg/dL (ref 0–200)
HDL: 44.4 mg/dL (ref >39.00)
LDL CALC: 113 mg/dL — AB (ref 0–99)
NONHDL: 143.93
Triglycerides: 156 mg/dL — ABNORMAL HIGH (ref 0.0–149.0)
VLDL: 31.2 mg/dL (ref 0.0–40.0)

## 2017-03-24 NOTE — Progress Notes (Signed)
Subjective:  Patient ID: [name], female    DOB: Jun 20, 1940  Age: 77 y.o. MRN: [id number]  CC: The primary encounter diagnosis was Hyponatremia. Diagnoses of Hyperlipidemia LDL goal <100, Spinal stenosis of thoracolumbar region, Essential hypertension, Mixed hyperlipidemia, S/P placement of cardiac pacemaker, and Coronary artery disease involving native coronary artery of native heart without angina pectoris were also pertinent to this visit.  HPI [name] presents for follow up on chronic back pain due to spinal stenosis ,  Hypertension,  And hyponatremia, and CAD.   In the interim since her last visit she as treated by Pain management  With a lumbar facet and median branch nerve block   Which was done on Jan 25 2017.   per the provider's notes, the procedure  improved her back pain by 50 to 60%; however patient states that there was transient relief and minimal at that,  She has reconsidered the risk vs benefit of a  R/f ablation , which was offered as an optional treatment for long term relief   Taking The hydrocodone 4 times daily.  Pain is still present with the medication.  She sees Dr Holley Raring on Jan 17th   Who is planning to take over her prescriptionof opioids for  pain management   Still smoking,  Smokes on the  porch at her daughter's house  Has cut down since moving in with her on Dec 13th,  To 1/2 pack daily   BP at home rarely checked .  On metoprolol lisinopril and amlodipine   Nausea is infrequent   Sleep has been interrupted by  4 nighttime voids.  History of bladder cancer,  Treated  Recent cystoscopy normal .  Voiding small amounts .  Has not had a trial of anticholinergics.   Has declined mammogram  Wants to discuss procedure for managing spinal stenosis via pamphlet that  she picked up doesn't understand why she is not a candidate   Planning to start an exercise program with [name]  A local  therapist set up  By her daughter  [name] is "terrible" but she  has not fallen ,  Uses a rolling walker.      Lab Results  Component Value Date   CHOL 188 03/24/2017   HDL 44.40 03/24/2017   LDLCALC 113 (H) 03/24/2017   TRIG 156.0 (H) 03/24/2017   CHOLHDL 4 03/24/2017     Outpatient Medications Prior to Visit  Medication Sig Dispense Refill  . amLODipine (NORVASC) 10 MG tablet TAKE 1/2 TABLET(5MG ) BY MOUTH TWICE DAILY**NOTE DOSE** 30 tablet 0  . cyclobenzaprine (FLEXERIL) 5 MG tablet TAKE 1 TABLET(5 MG) BY MOUTH THREE TIMES DAILY AS NEEDED FOR MUSCLE SPASMS 90 tablet 0  . DULoxetine (CYMBALTA) 30 MG capsule TAKE 1 CAPSULE(30 MG) BY MOUTH DAILY 30 capsule 0  . gabapentin (NEURONTIN) 300 MG capsule Take 1 capsule (300 mg total) by mouth 2 (two) times daily. 60 capsule 2  . gabapentin (NEURONTIN) 300 MG capsule TAKE 1/2 TABLET(5MG ) BY MOUTH TWICE DAILY**NOTE DOSE** 30 capsule 2  . HYDROcodone-acetaminophen (NORCO) 10-325 MG tablet Take 1 tablet by mouth every 4 (four) hours as needed for severe pain (Pain). 180 tablet 0  . lisinopril (PRINIVIL,ZESTRIL) 40 MG tablet TAKE 1 TABLET(40 MG) BY MOUTH DAILY 30 tablet 0  . metoprolol succinate (TOPROL-XL) 25 MG 24 hr tablet Take 1 tablet (25 mg total) by mouth daily. 30 tablet 10  . pantoprazole (PROTONIX) 40 MG tablet TAKE 1 TABLET(40  MG) BY MOUTH TWICE DAILY 60 tablet 2  . polyethylene glycol (MIRALAX / GLYCOLAX) packet Take 17 g by mouth daily as needed for mild constipation.     . promethazine (PHENERGAN) 12.5 MG tablet TAKE 1 TABLET BY MOUTH EVERY 8 HOURS AS NEEDED FOR NAUSEA OR VOMITING 60 tablet 0  . spironolactone (ALDACTONE) 25 MG tablet TAKE 1 TABLET BY MOUTH DAILY 30 tablet 5  . sucralfate (CARAFATE) 1 g tablet TAKE 1 TABLET BY MOUTH FOUR TIMES DAILY EVERY NIGHT AT BEDTIME WITH MEALS 120 tablet 0   No facility-administered medications prior to visit.     Review of Systems;  Patient denies headache, fevers, malaise, unintentional weight loss, skin rash, eye pain, sinus congestion and sinus  pain, sore throat, dysphagia,  hemoptysis , cough, dyspnea, wheezing, chest pain, palpitations, orthopnea, edema, abdominal pain, nausea, melena, diarrhea, constipation, flank pain, dysuria, hematuria, urinary  Frequency, nocturia, numbness, tingling, seizures,  Focal weakness, Loss of consciousness,  Tremor, insomnia, depression, anxiety, and suicidal ideation.      Objective:  BP 130/86 (BP Location: Left Arm, Patient Position: Sitting, Cuff Size: Normal)   Pulse 76   Temp 97.8 F (36.6 C) (Oral)   Resp 14   Ht 5\' 8"  (1.727 m)   Wt 134 lb 6.4 oz (61 kg)   SpO2 96%   BMI 20.44 kg/m   BP Readings from Last 3 Encounters:  03/24/17 130/86  02/16/17 (!) 144/78  02/07/17 126/66    Wt Readings from Last 3 Encounters:  03/24/17 134 lb 6.4 oz (61 kg)  02/16/17 138 lb (62.6 kg)  02/07/17 138 lb (62.6 kg)    General appearance: alert, cooperative and appears stated age Ears: normal TM's and external ear canals both ears Throat: lips, mucosa, and tongue normal; teeth and gums normal Neck: no adenopathy, no carotid bruit, supple, symmetrical, trachea midline and thyroid not enlarged, symmetric, no tenderness/mass/nodules Back: symmetric, no curvature. ROM normal. No CVA tenderness. Lungs: clear to auscultation bilaterally Heart: regular rate and rhythm, S1, S2 normal, no murmur, click, rub or gallop Abdomen: soft, non-tender; bowel sounds normal; no masses,  no organomegaly Pulses: 2+ and symmetric Skin: Skin color, texture, turgor normal. No rashes or lesions Lymph nodes: Cervical, supraclavicular, and axillary nodes normal.  Lab Results  Component Value Date   HGBA1C 4.6 12/27/2013   HGBA1C 5.7 (H) 08/31/2010    Lab Results  Component Value Date   CREATININE 0.73 03/24/2017   CREATININE 0.69 11/02/2016   CREATININE 0.67 10/04/2016    Lab Results  Component Value Date   WBC 7.9 10/02/2015   HGB 13.5 10/02/2015   HCT 40.4 10/02/2015   PLT 209 10/02/2015   GLUCOSE 109  (H) 03/24/2017   CHOL 188 03/24/2017   TRIG 156.0 (H) 03/24/2017   HDL 44.40 03/24/2017   LDLCALC 113 (H) 03/24/2017   ALT 17 03/24/2017   AST 25 03/24/2017   NA 132 (L) 03/24/2017   K 4.9 03/24/2017   CL 99 03/24/2017   CREATININE 0.73 03/24/2017   BUN 11 03/24/2017   CO2 29 03/24/2017   TSH 0.67 10/10/2016   INR 1.0 04/28/2014   HGBA1C 4.6 12/27/2013    Dg C-arm 1-60 Min-no Report  Result Date: 01/25/2017 Fluoroscopy was utilized by the requesting physician.  No radiographic interpretation.    Assessment & Plan:   Problem List Items Addressed This Visit    Coronary artery disease    By cardiac catherization in 2016 with 50% stenosis of distal  RCA and 50% stenosis of RPLS      Hyperlipidemia    10 yr risk of AMI/stroke is  25% ; and she has known 2 vessel CAD by 2016 cardiac cath (Nonobstructive) .  Will recommend trial of atorvastatin.   Lab Results  Component Value Date   CHOL 188 03/24/2017   HDL 44.40 03/24/2017   LDLCALC 113 (H) 03/24/2017   TRIG 156.0 (H) 03/24/2017   CHOLHDL 4 03/24/2017        Hypertension    she reports compliance with medication regimen  but has an elevated reading today in office.  She  has been asked to check her BP at home and  submit readings for evaluation. Renal function will be checked today.   Lab Results  Component Value Date   NA 132 (L) 03/24/2017   K 4.9 03/24/2017   CL 99 03/24/2017   CO2 29 03/24/2017   Lab Results  Component Value Date   CREATININE 0.73 03/24/2017         Hyponatremia - Primary    Persistent, mild and stable.  Likely due to COPD  Lab Results  Component Value Date   NA 132 (L) 03/24/2017   K 4.9 03/24/2017   CL 99 03/24/2017   CO2 29 03/24/2017         Relevant Orders   Comprehensive metabolic panel (Completed)   S/P placement of cardiac pacemaker   Spinal stenosis of thoracolumbar region    She is  Now  under treatment by Pain management.  Her pain is not controlled with  hydrocodone 4 TIMES DAILY and it apparently did not respond to lumbar facet and median branch nerve block. Past trail of oxycodone was not tolerated. .  I have not refilled her hydrocodone since she has an adequate amoutn to use until she sees Dr Holley Raring later this month.         Other Visit Diagnoses    Hyperlipidemia LDL goal <100       Relevant Orders   Lipid panel (Completed)     A total of 25 minutes of face to face time was spent with patient more than half of which was spent in counselling about the above mentioned conditions  and coordination of care   I am having Michol C. Vanlue maintain her polyethylene glycol, DULoxetine, HYDROcodone-acetaminophen, spironolactone, pantoprazole, lisinopril, promethazine, sucralfate, cyclobenzaprine, amLODipine, gabapentin, gabapentin, and metoprolol succinate.  No orders of the defined types were placed in this encounter.   There are no discontinued medications.  Follow-up: Return in about 6 months (around 09/21/2017).   [name], MD

## 2017-03-24 NOTE — Patient Instructions (Signed)
Please check you blood pressure once a week .  Our goal BP is < 130/80

## 2017-03-26 DIAGNOSIS — I2510 Atherosclerotic heart disease of native coronary artery without angina pectoris: Secondary | ICD-10-CM | POA: Insufficient documentation

## 2017-03-26 DIAGNOSIS — Z950 Presence of cardiac pacemaker: Secondary | ICD-10-CM | POA: Insufficient documentation

## 2017-03-26 DIAGNOSIS — E785 Hyperlipidemia, unspecified: Secondary | ICD-10-CM | POA: Insufficient documentation

## 2017-03-26 NOTE — Assessment & Plan Note (Signed)
By cardiac catherization in 2016 with 50% stenosis of distal RCA and 50% stenosis of RPLS

## 2017-03-26 NOTE — Assessment & Plan Note (Signed)
Persistent, mild and stable.  Likely due to COPD  Lab Results  Component Value Date   NA 132 (L) 03/24/2017   K 4.9 03/24/2017   CL 99 03/24/2017   CO2 29 03/24/2017

## 2017-03-26 NOTE — Assessment & Plan Note (Addendum)
10 yr risk of AMI/stroke is  25% ; and she has known 2 vessel CAD by 2016 cardiac cath (Nonobstructive) .  Will recommend trial of atorvastatin.   Lab Results  Component Value Date   CHOL 188 03/24/2017   HDL 44.40 03/24/2017   LDLCALC 113 (H) 03/24/2017   TRIG 156.0 (H) 03/24/2017   CHOLHDL 4 03/24/2017

## 2017-03-26 NOTE — Assessment & Plan Note (Signed)
she reports compliance with medication regimen  but has an elevated reading today in office.  She  has been asked to check her BP at home and  submit readings for evaluation. Renal function will be checked today.   Lab Results  Component Value Date   NA 132 (L) 03/24/2017   K 4.9 03/24/2017   CL 99 03/24/2017   CO2 29 03/24/2017   Lab Results  Component Value Date   CREATININE 0.73 03/24/2017

## 2017-03-26 NOTE — Assessment & Plan Note (Signed)
She is  Now  under treatment by Pain management.  Her pain is not controlled with hydrocodone 4 TIMES DAILY and it apparently did not respond to lumbar facet and median branch nerve block. Past trail of oxycodone was not tolerated. .  I have not refilled her hydrocodone since she has an adequate amoutn to use until she sees Dr Holley Raring later this month.

## 2017-03-27 ENCOUNTER — Ambulatory Visit: Payer: Medicare Other | Admitting: Student in an Organized Health Care Education/Training Program

## 2017-04-06 ENCOUNTER — Encounter: Payer: Medicare Other | Admitting: Student in an Organized Health Care Education/Training Program

## 2017-04-11 ENCOUNTER — Other Ambulatory Visit: Payer: Self-pay | Admitting: Internal Medicine

## 2017-04-12 ENCOUNTER — Encounter: Payer: Self-pay | Admitting: Student in an Organized Health Care Education/Training Program

## 2017-04-12 ENCOUNTER — Ambulatory Visit
Payer: Medicare Other | Attending: Student in an Organized Health Care Education/Training Program | Admitting: Student in an Organized Health Care Education/Training Program

## 2017-04-12 VITALS — BP 118/73 | HR 74 | Temp 97.8°F | Resp 16 | Ht 68.0 in | Wt 135.0 lb

## 2017-04-12 DIAGNOSIS — I509 Heart failure, unspecified: Secondary | ICD-10-CM | POA: Diagnosis not present

## 2017-04-12 DIAGNOSIS — F17210 Nicotine dependence, cigarettes, uncomplicated: Secondary | ICD-10-CM | POA: Insufficient documentation

## 2017-04-12 DIAGNOSIS — K2960 Other gastritis without bleeding: Secondary | ICD-10-CM | POA: Diagnosis not present

## 2017-04-12 DIAGNOSIS — Z791 Long term (current) use of non-steroidal anti-inflammatories (NSAID): Secondary | ICD-10-CM | POA: Insufficient documentation

## 2017-04-12 DIAGNOSIS — I447 Left bundle-branch block, unspecified: Secondary | ICD-10-CM | POA: Diagnosis not present

## 2017-04-12 DIAGNOSIS — Z95810 Presence of automatic (implantable) cardiac defibrillator: Secondary | ICD-10-CM | POA: Diagnosis not present

## 2017-04-12 DIAGNOSIS — M5136 Other intervertebral disc degeneration, lumbar region: Secondary | ICD-10-CM | POA: Diagnosis not present

## 2017-04-12 DIAGNOSIS — E871 Hypo-osmolality and hyponatremia: Secondary | ICD-10-CM | POA: Diagnosis not present

## 2017-04-12 DIAGNOSIS — Z79899 Other long term (current) drug therapy: Secondary | ICD-10-CM | POA: Diagnosis not present

## 2017-04-12 DIAGNOSIS — I420 Dilated cardiomyopathy: Secondary | ICD-10-CM | POA: Insufficient documentation

## 2017-04-12 DIAGNOSIS — K219 Gastro-esophageal reflux disease without esophagitis: Secondary | ICD-10-CM | POA: Insufficient documentation

## 2017-04-12 DIAGNOSIS — M1990 Unspecified osteoarthritis, unspecified site: Secondary | ICD-10-CM | POA: Insufficient documentation

## 2017-04-12 DIAGNOSIS — M4802 Spinal stenosis, cervical region: Secondary | ICD-10-CM | POA: Insufficient documentation

## 2017-04-12 DIAGNOSIS — J449 Chronic obstructive pulmonary disease, unspecified: Secondary | ICD-10-CM | POA: Diagnosis not present

## 2017-04-12 DIAGNOSIS — M47816 Spondylosis without myelopathy or radiculopathy, lumbar region: Secondary | ICD-10-CM

## 2017-04-12 DIAGNOSIS — M4805 Spinal stenosis, thoracolumbar region: Secondary | ICD-10-CM | POA: Insufficient documentation

## 2017-04-12 DIAGNOSIS — I2510 Atherosclerotic heart disease of native coronary artery without angina pectoris: Secondary | ICD-10-CM | POA: Insufficient documentation

## 2017-04-12 DIAGNOSIS — Z8551 Personal history of malignant neoplasm of bladder: Secondary | ICD-10-CM | POA: Diagnosis not present

## 2017-04-12 DIAGNOSIS — M48062 Spinal stenosis, lumbar region with neurogenic claudication: Secondary | ICD-10-CM | POA: Diagnosis not present

## 2017-04-12 DIAGNOSIS — M4698 Unspecified inflammatory spondylopathy, sacral and sacrococcygeal region: Secondary | ICD-10-CM | POA: Diagnosis not present

## 2017-04-12 DIAGNOSIS — G894 Chronic pain syndrome: Secondary | ICD-10-CM | POA: Insufficient documentation

## 2017-04-12 DIAGNOSIS — Z950 Presence of cardiac pacemaker: Secondary | ICD-10-CM | POA: Insufficient documentation

## 2017-04-12 DIAGNOSIS — I110 Hypertensive heart disease with heart failure: Secondary | ICD-10-CM | POA: Insufficient documentation

## 2017-04-12 DIAGNOSIS — X58XXXA Exposure to other specified factors, initial encounter: Secondary | ICD-10-CM | POA: Diagnosis not present

## 2017-04-12 DIAGNOSIS — E785 Hyperlipidemia, unspecified: Secondary | ICD-10-CM | POA: Insufficient documentation

## 2017-04-12 DIAGNOSIS — M47818 Spondylosis without myelopathy or radiculopathy, sacral and sacrococcygeal region: Secondary | ICD-10-CM

## 2017-04-12 DIAGNOSIS — G43909 Migraine, unspecified, not intractable, without status migrainosus: Secondary | ICD-10-CM | POA: Insufficient documentation

## 2017-04-12 DIAGNOSIS — M545 Low back pain: Secondary | ICD-10-CM | POA: Diagnosis present

## 2017-04-12 DIAGNOSIS — K5900 Constipation, unspecified: Secondary | ICD-10-CM | POA: Insufficient documentation

## 2017-04-12 DIAGNOSIS — T39395A Adverse effect of other nonsteroidal anti-inflammatory drugs [NSAID], initial encounter: Secondary | ICD-10-CM | POA: Insufficient documentation

## 2017-04-12 MED ORDER — HYDROCODONE-ACETAMINOPHEN 10-325 MG PO TABS: 1.0000 | ORAL_TABLET | ORAL | 0 refills | Status: DC | PRN

## 2017-04-12 NOTE — Progress Notes (Signed)
Safety precautions to be maintained throughout the outpatient stay will include: orient to surroundings, keep bed in low position, maintain call bell within reach at all times, provide assistance with transfer out of bed and ambulation.  

## 2017-04-12 NOTE — Progress Notes (Signed)
Patient's Name: [name]  MRN: [id number]  Referring Provider: [name], MD  DOB: February 25, 1941  PCP: [name], MD  DOS: 04/12/2017  Note by: [name], MD  Service setting: Ambulatory outpatient  Specialty: Interventional Pain Management  Location: [facility] (AMB) Pain Management Facility    Patient type: Established   Primary Reason(s) for Visit: Encounter for prescription drug management. (Level of risk: moderate)  CC: Back Pain (lower bilateral more on the left)  HPI  [name] is a 77 y.o. year old, female patient, who comes today for a medication management evaluation. She has Palpitations; Hyponatremia; Bladder cancer ([place]); LBBB (left bundle branch block); Atrial ectopy; Congestive dilated cardiomyopathy ([place]); History of gastric ulcer; Spinal stenosis of thoracolumbar region; COPD (chronic obstructive pulmonary disease) ([place]); Constipation; Spinal stenosis in cervical region; Chronic bilateral low back pain; NSAID induced gastritis; Parkinsonian features; Hypertension; Spinal stenosis of lumbar region without neurogenic claudication; Other idiopathic scoliosis, lumbar region; Hyperlipidemia; S/P placement of cardiac pacemaker; and Coronary artery disease on their problem list. Her primarily concern today is the Back Pain (lower bilateral more on the left)  Pain Assessment: Location: Lower, Left, Right Back Radiating: into the buttocks bilateral sometimes down to the knees Onset: More than a month ago Duration: Chronic pain Quality: Discomfort, Nagging, Constant Severity: 6 /10 (self-reported pain score)  Note: Reported level is inconsistent with clinical observations. Clinically the patient looks like a 3/10 A 3/10 is viewed as "Moderate" and described as significantly interfering with activities of daily living (ADL). It becomes difficult to feed, bathe, get dressed, get on and off the toilet or to perform personal hygiene functions. Difficult to get in and out of bed or a  chair without assistance. Very distracting. With effort, it can be ignored when deeply involved in activities.       When using our objective Pain Scale, levels between 6 and 10/10 are said to belong in an emergency room, as it progressively worsens from a 6/10, described as severely limiting, requiring emergency care not usually available at an outpatient pain management facility. At a 6/10 level, communication becomes difficult and requires great effort. Assistance to reach the emergency department may be required. Facial flushing and profuse sweating along with potentially dangerous increases in heart rate and blood pressure will be evident. Effect on ADL: bending and increased activity makes pain much worse  Timing: Constant Modifying factors: pain medications, rest  [name] was last scheduled for an appointment on 02/16/2017 for medication management. During today's appointment we reviewed [name] chronic pain status, as well as her outpatient medication regimen.  The patient  reports that she does not use drugs. Her body mass index is 20.53 kg/m.  Further details on both, my assessment(s), as well as the proposed treatment plan, please see below.  Controlled Substance Pharmacotherapy Assessment REMS (Risk Evaluation and Mitigation Strategy)  Analgesic: Hydrocodone 10 mg every 4 hours as needed for severe pain.,  Quantity 180 a month.  MME equals 60 MME/day: 60 mg/day.  Janett Billow, RN  04/12/2017 12:45 PM  Sign at close encounter Safety precautions to be maintained throughout the outpatient stay will include: orient to surroundings, keep bed in low position, maintain call bell within reach at all times, provide assistance with transfer out of bed and ambulation.    Pharmacokinetics: Liberation and absorption (onset of action): WNL Distribution (time to peak effect): WNL Metabolism and excretion (duration of action): WNL  Pharmacodynamics: Desired effects: Analgesia:  [name] reports >50% benefit. Functional ability: Patient reports that medication allows her to accomplish basic ADLs Clinically meaningful improvement in function (CMIF): Sustained CMIF goals met Perceived effectiveness: Described as relatively effective, allowing for increase in activities of daily living (ADL) Undesirable effects: Side-effects or Adverse reactions: None reported Monitoring: [place] PMP: Online review of the past 78-monthperiod conducted. Compliant with practice rules and regulations Last UDS on record: Summary  Date Value Ref Range Status  12/27/2016 FINAL  Final    Comment:    ==================================================================== TOXASSURE COMP DRUG ANALYSIS,UR ==================================================================== Test                             Result       Flag       Units Drug Present and Declared for Prescription Verification   Hydrocodone                    3156         EXPECTED   ng/mg creat   Hydromorphone                  848          EXPECTED   ng/mg creat   Dihydrocodeine                 236          EXPECTED   ng/mg creat   Norhydrocodone                 3780         EXPECTED   ng/mg creat    Sources of hydrocodone include scheduled prescription    medications. Hydromorphone, dihydrocodeine and norhydrocodone are    expected metabolites of hydrocodone. Hydromorphone and    dihydrocodeine are also available as scheduled prescription    medications.   Gabapentin                     PRESENT      EXPECTED   Cyclobenzaprine                PRESENT      EXPECTED   Desmethylcyclobenzaprine       PRESENT      EXPECTED    Desmethylcyclobenzaprine is an expected metabolite of    cyclobenzaprine.   Acetaminophen                  PRESENT      EXPECTED   Metoprolol                     PRESENT      EXPECTED Drug Absent but Declared for Prescription Verification   Duloxetine                     Not Detected UNEXPECTED   Promethazine                    Not Detected UNEXPECTED ==================================================================== Test                      Result    Flag   Units      Ref Range   Creatinine              25               mg/dL      >=  20 ==================================================================== Declared Medications:  The flagging and interpretation on this report are based on the  following declared medications.  Unexpected results may arise from  inaccuracies in the declared medications.  **Note: The testing scope of this panel includes these medications:  Cyclobenzaprine (Flexeril)  Duloxetine (Cymbalta)  Gabapentin (Neurontin)  Hydrocodone (Norco)  Metoprolol (Toprol)  Promethazine (Phenergan)  **Note: The testing scope of this panel does not include small to  moderate amounts of these reported medications:  Acetaminophen (Norco)  **Note: The testing scope of this panel does not include following  reported medications:  Amlodipine (Norvasc)  Lisinopril (Prinivil)  Pantoprazole (Protonix)  Polyethylene Glycol (MiraLAX)  Spironolactone (Aldactone)  Sucralfate (Carafate) ==================================================================== For clinical consultation, please call [phone]. ====================================================================    UDS interpretation: Compliant          Medication Assessment Form: Reviewed. Patient indicates being compliant with therapy Treatment compliance: Compliant Risk Assessment Profile: Aberrant behavior: See prior evaluations. None observed or detected today Comorbid factors increasing risk of overdose: See prior notes. No additional risks detected today Risk of substance use disorder (SUD): Low Opioid Risk Tool - 04/12/17 1243      Family History of Substance Abuse   Alcohol  Negative    Illegal Drugs  Negative    Rx Drugs  Negative      Personal History of Substance Abuse   Alcohol  Negative    Illegal Drugs   Negative    Rx Drugs  Negative      Psychological Disease   Psychological Disease  Negative    Depression  Negative      Total Score   Opioid Risk Tool Scoring  0    Opioid Risk Interpretation  Low Risk      ORT Scoring interpretation table:  Score <3 = Low Risk for SUD  Score between 4-7 = Moderate Risk for SUD  Score >8 = High Risk for Opioid Abuse   Risk Mitigation Strategies:  Patient Counseling: Covered Patient-Prescriber Agreement (PPA): Present and active  Notification to other healthcare providers: Done  Pharmacologic Plan: No change in therapy, at this time.             Laboratory Chemistry  Inflammation Markers (CRP: Acute Phase) (ESR: Chronic Phase) No results found for: CRP, ESRSEDRATE, LATICACIDVEN               Rheumatology Markers No results found for: RF, ANA, Therisa Doyne, [facility]              Renal Function Markers Lab Results  Component Value Date   BUN 11 03/24/2017   CREATININE 0.73 03/24/2017   GFRAA >60 10/02/2015   GFRNONAA >60 10/02/2015                 Hepatic Function Markers Lab Results  Component Value Date   AST 25 03/24/2017   ALT 17 03/24/2017   ALBUMIN 4.4 03/24/2017   ALKPHOS 105 03/24/2017   LIPASE 30 11/23/2013                 Electrolytes Lab Results  Component Value Date   NA 132 (L) 03/24/2017   K 4.9 03/24/2017   CL 99 03/24/2017   CALCIUM 9.9 03/24/2017                 Neuropathy Markers Lab Results  Component Value Date   VITAMINB12 1,069 (H) 12/27/2013   HGBA1C 4.6 12/27/2013  Bone Pathology Markers No results found for: Marveen Reeks, [id number], [id number], 25OHVITD1, 25OHVITD2, 25OHVITD3, TESTOFREE, TESTOSTERONE               Coagulation Parameters Lab Results  Component Value Date   INR 1.0 04/28/2014   LABPROT 10.3 04/28/2014   APTT 27 11/22/2013   PLT 209 10/02/2015                 Cardiovascular Markers Lab Results  Component Value Date   CKTOTAL  87 09/01/2010   CKMB 4.1 (H) 09/01/2010   TROPONINI <0.30 11/23/2013   HGB 13.5 10/02/2015   HCT 40.4 10/02/2015                 CA Markers No results found for: CEA, CA125, LABCA2               Note: Lab results reviewed.  Recent Diagnostic Imaging Results  DG C-Arm 1-60 Min-No Report Fluoroscopy was utilized by the requesting physician.  No radiographic  interpretation.   Complexity Note: Imaging results reviewed. Results shared with [name], using Layman's terms.                         Meds   Current Outpatient Medications:  .  amLODipine (NORVASC) 10 MG tablet, TAKE 1/2 TABLET(5MG) BY MOUTH TWICE DAILY**NOTE DOSE**, Disp: 30 tablet, Rfl: 0 .  cyclobenzaprine (FLEXERIL) 5 MG tablet, TAKE 1 TABLET(5 MG) BY MOUTH THREE TIMES DAILY AS NEEDED FOR MUSCLE SPASMS, Disp: 90 tablet, Rfl: 0 .  DULoxetine (CYMBALTA) 30 MG capsule, TAKE 1 CAPSULE(30 MG) BY MOUTH DAILY, Disp: 30 capsule, Rfl: 0 .  gabapentin (NEURONTIN) 300 MG capsule, Take 1 capsule (300 mg total) by mouth 2 (two) times daily., Disp: 60 capsule, Rfl: 2 .  HYDROcodone-acetaminophen (NORCO) 10-325 MG tablet, Take 1 tablet by mouth every 4 (four) hours as needed for severe pain (Pain)., Disp: 180 tablet, Rfl: 0 .  lisinopril (PRINIVIL,ZESTRIL) 40 MG tablet, TAKE 1 TABLET(40 MG) BY MOUTH DAILY, Disp: 30 tablet, Rfl: 0 .  metoprolol succinate (TOPROL-XL) 25 MG 24 hr tablet, Take 1 tablet (25 mg total) by mouth daily., Disp: 30 tablet, Rfl: 10 .  pantoprazole (PROTONIX) 40 MG tablet, TAKE 1 TABLET(40 MG) BY MOUTH TWICE DAILY, Disp: 60 tablet, Rfl: 2 .  polyethylene glycol (MIRALAX / GLYCOLAX) packet, Take 17 g by mouth daily as needed for mild constipation. , Disp: , Rfl:  .  promethazine (PHENERGAN) 12.5 MG tablet, TAKE 1 TABLET BY MOUTH EVERY 8 HOURS AS NEEDED FOR NAUSEA OR VOMITING, Disp: 60 tablet, Rfl: 0 .  spironolactone (ALDACTONE) 25 MG tablet, TAKE 1 TABLET BY MOUTH DAILY, Disp: 30 tablet, Rfl: 5 .  sucralfate  (CARAFATE) 1 g tablet, TAKE 1 TABLET BY MOUTH FOUR TIMES DAILY EVERY NIGHT AT BEDTIME WITH MEALS, Disp: 120 tablet, Rfl: 0 .  gabapentin (NEURONTIN) 300 MG capsule, TAKE 1/2 TABLET(5MG) BY MOUTH TWICE DAILY**NOTE DOSE** (Patient not taking: Reported on 04/12/2017), Disp: 30 capsule, Rfl: 2  ROS  Constitutional: Denies any fever or chills Gastrointestinal: No reported hemesis, hematochezia, vomiting, or acute GI distress Musculoskeletal: Denies any acute onset joint swelling, redness, loss of ROM, or weakness Neurological: No reported episodes of acute onset apraxia, aphasia, dysarthria, agnosia, amnesia, paralysis, loss of coordination, or loss of consciousness  Allergies  [name] has No Known Allergies.  [place]  Drug: [name]  reports that she does not use drugs. Alcohol:  reports that  she does not drink alcohol. Tobacco:  reports that she has been smoking cigarettes.  She has a 27.50 pack-year smoking history. she has never used smokeless tobacco. Medical:  has a past medical history of AICD (automatic cardioverter/defibrillator) present, Anemia, Arthritis, Bladder cancer ([place]) (1995; 2015), CHF (congestive heart failure) ([place]), Chronic lower back pain, COPD (chronic obstructive pulmonary disease) ([place]), Ectopic atrial beats, Frequent urination, GERD (gastroesophageal reflux disease), History of stomach ulcers (11/2013), Hypertension, LBBB (left bundle branch block), and Migraines. Surgical: [name]  has a past surgical history that includes Esophagogastroduodenoscopy (N/A, 11/28/2013); Back surgery; Transurethral resection of bladder tumor (N/A, 12/13/2013); Transurethral resection of bladder tumor (N/A, 01/17/2014); Cataract extraction w/ intraocular lens  implant, bilateral (Bilateral, 12-2013 - [id number]); Cystoscopy with retrograde pyelogram, ureteroscopy and stent placement (Bilateral, 04/18/2014); Transurethral resection of bladder tumor (N/A, 04/18/2014); Cardiac defibrillator placement  (05/07/2014); Lumbar disc surgery (1980's); Cardiac catheterization (~ 02/2014); bi-ventricular implantable cardioverter defibrillator (N/A, 05/07/2014); Cystoscopy w/ retrogrades (Bilateral, 01/06/2015); and Cystoscopy with biopsy (N/A, 10/06/2015). Family: family history includes Cancer in her brother and mother.  Constitutional Exam  General appearance: Well nourished, well developed, and well hydrated. In no apparent acute distress Vitals:   04/12/17 1238  BP: 118/73  Pulse: 74  Resp: 16  Temp: 97.8 F (36.6 C)  TempSrc: Oral  SpO2: 100%  Weight: 135 lb (61.2 kg)  Height: _0  (1.727 m)   BMI Assessment: Estimated body mass index is 20.53 kg/m as calculated from the following:   Height as of this encounter: _1  (1.727 m).   Weight as of this encounter: 135 lb (61.2 kg).  BMI interpretation table: BMI level Category Range association with higher incidence of chronic pain  <18 kg/m2 Underweight   18.5-24.9 kg/m2 Ideal body weight   25-29.9 kg/m2 Overweight Increased incidence by 20%  30-34.9 kg/m2 Obese (Class I) Increased incidence by 68%  35-39.9 kg/m2 Severe obesity (Class II) Increased incidence by 136%  >40 kg/m2 Extreme obesity (Class III) Increased incidence by 254%   BMI Readings from Last 4 Encounters:  04/12/17 20.53 kg/m  03/24/17 20.44 kg/m  02/16/17 20.98 kg/m  02/07/17 20.98 kg/m   Wt Readings from Last 4 Encounters:  04/12/17 135 lb (61.2 kg)  03/24/17 134 lb 6.4 oz (61 kg)  02/16/17 138 lb (62.6 kg)  02/07/17 138 lb (62.6 kg)  Psych/Mental status: Alert, oriented x 3 (person, place, & time)       Eyes: PERLA Respiratory: No evidence of acute respiratory distress  Cervical Spine Area Exam  Skin & Axial Inspection: No masses, redness, edema, swelling, or associated skin lesions Alignment: Symmetrical Functional ROM: Unrestricted ROM      Stability: No instability detected Muscle Tone/Strength: Functionally intact. No obvious neuro-muscular  anomalies detected. Sensory (Neurological): Unimpaired Palpation: No palpable anomalies              Upper Extremity (UE) Exam    Side: Right upper extremity  Side: Left upper extremity  Skin & Extremity Inspection: Skin color, temperature, and hair growth are WNL. No peripheral edema or cyanosis. No masses, redness, swelling, asymmetry, or associated skin lesions. No contractures.  Skin & Extremity Inspection: Skin color, temperature, and hair growth are WNL. No peripheral edema or cyanosis. No masses, redness, swelling, asymmetry, or associated skin lesions. No contractures.  Functional ROM: Unrestricted ROM          Functional ROM: Unrestricted ROM          Muscle Tone/Strength: Functionally intact. No obvious neuro-muscular  anomalies detected.  Muscle Tone/Strength: Functionally intact. No obvious neuro-muscular anomalies detected.  Sensory (Neurological): Unimpaired          Sensory (Neurological): Unimpaired          Palpation: No palpable anomalies              Palpation: No palpable anomalies              Specialized Test(s): Deferred         Specialized Test(s): Deferred          Thoracic Spine Area Exam  Skin & Axial Inspection: No masses, redness, or swelling Alignment: Symmetrical Functional ROM: Unrestricted ROM Stability: No instability detected Muscle Tone/Strength: Functionally intact. No obvious neuro-muscular anomalies detected. Sensory (Neurological): Unimpaired Muscle strength & Tone: No palpable anomalies  Lumbar Spine Area Exam  Skin & Axial Inspection: No masses, redness, or swelling Alignment: Symmetrical Functional ROM: Unrestricted ROM      Stability: No instability detected Muscle Tone/Strength: Functionally intact. No obvious neuro-muscular anomalies detected. Sensory (Neurological): Unimpaired Palpation: No palpable anomalies       Provocative Tests: Lumbar Hyperextension and rotation test: Positive bilaterally for facet joint pain. Lumbar Lateral bending  test: Positive due to pain. Patrick's Maneuver: evaluation deferred today                    Gait & Posture Assessment  Ambulation: Limited Gait: Antalgic Posture: Difficulty standing up straight, due to pain   Lower Extremity Exam    Side: Right lower extremity  Side: Left lower extremity  Skin & Extremity Inspection: Skin color, temperature, and hair growth are WNL. No peripheral edema or cyanosis. No masses, redness, swelling, asymmetry, or associated skin lesions. No contractures.  Skin & Extremity Inspection: Skin color, temperature, and hair growth are WNL. No peripheral edema or cyanosis. No masses, redness, swelling, asymmetry, or associated skin lesions. No contractures.  Functional ROM: Unrestricted ROM          Functional ROM: Unrestricted ROM          Muscle Tone/Strength: Functionally intact. No obvious neuro-muscular anomalies detected.  Muscle Tone/Strength: Functionally intact. No obvious neuro-muscular anomalies detected.  Sensory (Neurological): Unimpaired  Sensory (Neurological): Unimpaired  Palpation: No palpable anomalies  Palpation: No palpable anomalies   Assessment  Primary Diagnosis & Pertinent Problem List: The primary encounter diagnosis was Lumbar spondylosis. 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  Status Diagnosis  Persistent Persistent Persistent 1. Lumbar spondylosis   2. 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 Lumbar degenerative disc disease   4. Chronic pain syndrome   5. Spinal stenosis, lumbar region, with neurogenic claudication      77 year old female with a history of lumbar spondylosis and SI joint arthropathy along with lumbar degenerative disc disease who presents for medication management.  We did extensive discussion about the patient's treatment plan.  Upon further discussion with the patient, it  seems that she only got approximately 1 day of benefit after our lumbar medial branch nerve blocks.  This was likely due to the IV medication that we provided her.  Given that the patient did not experience any significant long-lasting pain relief for the block, we discussed not going through with lumbar radiofrequency ablation.  We also discussed other potential procedures and if the SI joint injections in the lumbar facet blocks were not helpful, I informed  [name] that I do not have much to offer her for interventions regarding her axial lumbar spine.  The majority of her pain is secondary to severe spondylosis and we tried lumbar facet medial branch nerve blocks which were not significantly effective.  I do not recommend a trial of an epidural steroid injection since her pain does not seem radicular in nature and does not extend into her lower extremities.  We did have a discussion about the mild procedure and how it could potentially be helpful but that has fallen out of favor in the last 5 years due to the fact that insurance have not been reimbursing it.  We also had an extensive discussion about the patient's opioid medications.  We discussed alternatives such as oxycodone, morphine, methadone, Dilaudid.  My opinion I would not recommend morphine given its side effect profile and I do not recommend Dilaudid and patient is greater than 65 given the risk of respiratory depression and cognitive disturbances that can be seen with this medication.  Furthermore my concern of methadone is the risk of QTC prolongation in a patient at 77 years old.  Patient did trial oxycodone in May 2018 and did not find it helpful.  At this point we will continue with her current dose of hydrocodone.  Prescription provided for 3 months.  Plan: -Hydrocodone prescription as below -Continue Cymbalta, gabapentin, Flexeril as previously prescribed. -Discussed lumbar paraspinal muscle exercises that the patient could do at  home.   Plan of Care  Pharmacotherapy (Medications Ordered): Meds ordered this encounter  Medications  . DISCONTD: HYDROcodone-acetaminophen (NORCO) 10-325 MG tablet    Sig: Take 1 tablet by mouth every 4 (four) hours as needed for severe pain (Pain).    Dispense:  180 tablet    Refill:  0    04/14/17, 05/14/17, 06/10/17  . DISCONTD: HYDROcodone-acetaminophen (NORCO) 10-325 MG tablet    Sig: Take 1 tablet by mouth every 4 (four) hours as needed for severe pain (Pain).    Dispense:  180 tablet    Refill:  0    04/14/17, 05/14/17, 06/10/17  . HYDROcodone-acetaminophen (NORCO) 10-325 MG tablet    Sig: Take 1 tablet by mouth every 4 (four) hours as needed for severe pain (Pain).    Dispense:  180 tablet    Refill:  0    04/14/17, 05/14/17, 06/10/17    Provider-requested follow-up: Return in about 3 months (around 07/11/2017) for Medication Management. Time Note: Greater than 50% of the 25 minute(s) of face-to-face time spent with [name], was spent in counseling/coordination of care regarding: opioid tolerance, "Drug Holidays", [name] primary cause of pain, the treatment plan, treatment alternatives, the risks and possible complications of proposed treatment, the opioid analgesic risks and possible complications, the results, interpretation and significance of  her recent diagnostic interventional treatment(s), the appropriate use of her medications, realistic expectations and the medication agreement. Future Appointments  Date Time Provider [place]  04/27/2017 11:30 AM Deboraha Sprang, MD [id number]  05/16/2017  3:00 PM O'Brien-Blaney, Bryson Corona, LPN [facility]  1/61/0960  1:00 PM [name], MD [facility] None    Primary Care Physician: [name], MD Location: [facility] Note by: [name], M.D Date: 04/12/2017; Time: 3:41 PM  Patient Instructions  Rx for 3 months

## 2017-04-12 NOTE — Patient Instructions (Signed)
Rx for 3 months

## 2017-04-20 ENCOUNTER — Other Ambulatory Visit: Payer: Self-pay | Admitting: Internal Medicine

## 2017-04-27 ENCOUNTER — Encounter: Payer: Medicare Other | Admitting: Internal Medicine

## 2017-05-09 ENCOUNTER — Other Ambulatory Visit: Payer: Self-pay | Admitting: Internal Medicine

## 2017-05-16 ENCOUNTER — Ambulatory Visit: Payer: Medicare Other

## 2017-05-30 ENCOUNTER — Encounter: Payer: Self-pay | Admitting: Internal Medicine

## 2017-05-30 ENCOUNTER — Ambulatory Visit (INDEPENDENT_AMBULATORY_CARE_PROVIDER_SITE_OTHER): Payer: Medicare Other | Admitting: Internal Medicine

## 2017-05-30 VITALS — BP 122/80 | HR 73 | Ht 68.0 in | Wt 124.2 lb

## 2017-05-30 DIAGNOSIS — I428 Other cardiomyopathies: Secondary | ICD-10-CM

## 2017-05-30 DIAGNOSIS — I5022 Chronic systolic (congestive) heart failure: Secondary | ICD-10-CM | POA: Diagnosis not present

## 2017-05-30 DIAGNOSIS — Z95810 Presence of automatic (implantable) cardiac defibrillator: Secondary | ICD-10-CM | POA: Diagnosis not present

## 2017-05-30 DIAGNOSIS — I447 Left bundle-branch block, unspecified: Secondary | ICD-10-CM | POA: Diagnosis not present

## 2017-05-30 DIAGNOSIS — R001 Bradycardia, unspecified: Secondary | ICD-10-CM

## 2017-05-30 DIAGNOSIS — I10 Essential (primary) hypertension: Secondary | ICD-10-CM

## 2017-05-30 DIAGNOSIS — I2510 Atherosclerotic heart disease of native coronary artery without angina pectoris: Secondary | ICD-10-CM

## 2017-05-30 MED ORDER — AMLODIPINE BESYLATE 2.5 MG PO TABS: 2.5000 mg | ORAL_TABLET | Freq: Every day | ORAL | 3 refills | Status: DC

## 2017-05-30 NOTE — Progress Notes (Signed)
Electrophysiology Office Note   Date:  05/30/2017   ID:  [name], DOB [birth year], MRN [id number]  PCP:  [name], MD  Cardiologist:  MA Primary Electrophysiologist:  Virl Axe, MD    Chief Complaint  Patient presents with  . Other    12 month follow up. Patient Patient c/o a little SOB. Meds reviewed verbally with patient.      History of Present Illness: [name] is a 77 y.o. female seen in followup  for CRT-D implanted 2/16.   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 She also had tracings in the hospital with the operating QRS in lead V1 suggesting alternating bundle branch block; however those times, the limb leads still demonstrated left bundle branch block  DATE TEST EF   9/15 Myoview 28%   1/16 Cath   % CA min disease  10/17 Echo   55-65 %         Date Cr K  7/17 0.55 4.1  1/19 0.73 4.9   She denies chest pain or shortness of breath.  Her major issues related to back pain and "balance ".  She is unable to stand without holding on.  It is worse with longer standing.  Review of the old medical record does not uncover orthostatic vital signs  Past Medical History:  Diagnosis Date  . AICD (automatic cardioverter/defibrillator) present   . Anemia   . Arthritis    "in about all my joints; for sure in my back"  . Bladder cancer ([place]) 1995; 2015  . CHF (congestive heart failure) ([place])   . Chronic lower back pain   . COPD (chronic obstructive pulmonary disease) ([place])   . Ectopic atrial beats   . Frequent urination   . GERD (gastroesophageal reflux disease)    h/o ulcers  . History of stomach ulcers 11/2013  . Hypertension   . LBBB (left bundle branch block)   . Migraines    "stopped before menopause"   Past Surgical History:  Procedure Laterality Date  . BACK SURGERY    . BI-VENTRICULAR IMPLANTABLE CARDIOVERTER DEFIBRILLATOR N/A 05/07/2014   Procedure: BI-VENTRICULAR  IMPLANTABLE CARDIOVERTER DEFIBRILLATOR  (CRT-D);  Surgeon: Deboraha Sprang, MD;  Location: [facility] CATH LAB;  Service: Cardiovascular;  Laterality: N/A;  . CARDIAC CATHETERIZATION  ~ 02/2014  . CARDIAC DEFIBRILLATOR PLACEMENT  05/07/2014   dual chamber  . CATARACT EXTRACTION W/ INTRAOCULAR LENS  IMPLANT, BILATERAL Bilateral 12-2013 - 02-2014  . CYSTOSCOPY W/ RETROGRADES Bilateral 01/06/2015   Procedure: CYSTOSCOPY WITH  BLADDER BIOPSY BILATERAL RETROGRADE PYELOGRAM,INSTILLATION OF MITOMYCIN C;  Surgeon: [name], MD;  Location: [facility];  Service: Urology;  Laterality: Bilateral;  . CYSTOSCOPY WITH BIOPSY N/A 10/06/2015   Procedure: CYSTO WITH BLADDER BIOPSY, FULGERATION, CHEMO IRRIGATION EPIRUBICIN IN PACU;  Surgeon: [name], MD;  Location: [facility];  Service: Urology;  Laterality: N/A;  . CYSTOSCOPY WITH RETROGRADE PYELOGRAM, URETEROSCOPY AND STENT PLACEMENT Bilateral 04/18/2014   Procedure: CYSTOSCOPY WITH RETROGRADE PYELOGRAM;  Surgeon: [name], MD;  [facility]: [facility];  Service: Urology;  Laterality: Bilateral;  . ESOPHAGOGASTRODUODENOSCOPY N/A 11/28/2013   Procedure: ESOPHAGOGASTRODUODENOSCOPY (EGD);  Surgeon: Arta Silence, MD;  Location: [facility] ENDOSCOPY;  Service: Endoscopy;  Laterality: N/A;  . LUMBAR [place]  1980's   "ruptured disc"  . TRANSURETHRAL RESECTION OF BLADDER TUMOR N/A 12/13/2013   Procedure: TRANSURETHRAL RESECTION OF BLADDER TUMOR (TURBT);  Surgeon: [name], MD;  [facility];  Service: Urology;  Laterality: N/A;  . TRANSURETHRAL RESECTION OF BLADDER TUMOR N/A 01/17/2014   Procedure: TRANSURETHRAL RESECTION OF BLADDER TUMOR (TURBT);  Surgeon: [name], MD;  [facility];  Service: Urology;  Laterality: N/A;  . TRANSURETHRAL RESECTION OF BLADDER TUMOR N/A 04/18/2014   Procedure: TRANSURETHRAL RESECTION OF BLADDER TUMOR (TURBT), CYSTOGRAM;  Surgeon: [name], MD;  [facility];  Service: Urology;  Laterality: N/A;      Current Outpatient Medications  Medication Sig Dispense Refill  . amLODipine (NORVASC) 10 MG tablet TAKE 1/2 TABLET(5MG ) BY MOUTH TWICE DAILY**NOTE DOSE** 30 tablet 0  . cyclobenzaprine (FLEXERIL) 5 MG tablet TAKE 1 TABLET(5 MG) BY MOUTH THREE TIMES DAILY AS NEEDED FOR MUSCLE SPASMS 90 tablet 0  . DULoxetine (CYMBALTA) 30 MG capsule TAKE 1 CAPSULE(30 MG) BY MOUTH DAILY 30 capsule 0  . gabapentin (NEURONTIN) 300 MG capsule Take 1 capsule (300 mg total) by mouth 2 (two) times daily. (Patient taking differently: Take 300 mg by mouth daily. ) 60 capsule 2  . HYDROcodone-acetaminophen (NORCO) 10-325 MG tablet Take 1 tablet by mouth every 4 (four) hours as needed for severe pain (Pain). (Patient taking differently: Take 1 tablet by mouth every 4 (four) hours as needed for severe pain (Pain).) 180 tablet 0  . lisinopril (PRINIVIL,ZESTRIL) 40 MG tablet TAKE 1 TABLET(40 MG) BY MOUTH DAILY 90 tablet 1  . metoprolol succinate (TOPROL-XL) 25 MG 24 hr tablet Take 1 tablet (25 mg total) by mouth daily. 30 tablet 10  . pantoprazole (PROTONIX) 40 MG tablet TAKE 1 TABLET(40 MG) BY MOUTH TWICE DAILY 60 tablet 2  . polyethylene glycol (MIRALAX / GLYCOLAX) packet Take 17 g by mouth daily as needed for mild constipation.     . promethazine (PHENERGAN) 12.5 MG tablet TAKE 1 TABLET BY MOUTH EVERY 8 HOURS AS NEEDED FOR NAUSEA OR VOMITING 60 tablet 0  . spironolactone (ALDACTONE) 25 MG tablet TAKE 1 TABLET BY MOUTH DAILY 30 tablet 5  . sucralfate (CARAFATE) 1 g tablet TAKE 1 TABLET BY MOUTH FOUR TIMES DAILY EVERY NIGHT AT BEDTIME WITH MEALS 120 tablet 0   No current facility-administered medications for this visit.     Allergies:   Patient has no known allergies.   Social History:  The patient  reports that she has been smoking cigarettes.  She has a 27.50 pack-year smoking history. she has never used smokeless tobacco. She reports that she does not drink alcohol or use drugs.   Family History:  The patient's  *family history includes Cancer in her brother and mother.    ROS:  Please see the history of present illness.  .   All other systems are reviewed and negative.    PHYSICAL EXAM: VS:  BP 122/80 (BP Location: Right Arm, Patient Position: Sitting, Cuff Size: Normal)   Pulse 73   Ht 5\' 8"  (1.727 m)   Wt 124 lb 4 oz (56.4 kg)   BMI 18.89 kg/m  , BMI Body mass index is 18.89 kg/m. Well developed and nourished in no acute distress HENT normal Neck supple with JVP-flat Clear Regular rate and rhythm, no murmurs or gallops Abd-soft with active BS No Clubbing cyanosis edema Skin-warm and dry A & Oriented wide-based gait and instability upon standing   EKG: Sinus rhythm with P synchronous biventricular pacing with a positive QRS in lead V1 and a QRS in lead I   Recent Labs: 10/10/2016: TSH 0.67 03/24/2017: ALT 17; BUN 11; Creatinine, Ser  0.73; Potassium 4.9; Sodium 132  Personally reviewed    Lipid Panel     Component Value Date/Time   CHOL 188 03/24/2017 1407   TRIG 156.0 (H) 03/24/2017 1407   HDL 44.40 03/24/2017 1407   CHOLHDL 4 03/24/2017 1407   VLDL 31.2 03/24/2017 1407   LDLCALC 113 (H) 03/24/2017 1407     Wt Readings from Last 3 Encounters:  05/30/17 124 lb 4 oz (56.4 kg)  04/12/17 135 lb (61.2 kg)  03/24/17 134 lb 6.4 oz (61 kg)      Other studies Reviewed: Additional studies/ records that were reviewed today include: cath   Review of the above records today demonstrates: as above   ASSESSMENT AND PLAN:  Cardiomyopathy nonischemic  Congestive heart failure-chronic systolic  COPD/emphysema  Hypertension  Left bundle branch block  Sinus bradycardia-borderline  CRT-D [employer]  Gait instability    Euvolemic continue current meds  BP well controlled with some degree of orthostatic hypotension.  She has had significant systolic hypertension in the past.  I wonder to what degree the orthostasis may be contributing to her balance issues.  I taken the  liberty of decreasing her amlodipine from 5 twice daily--2.5 daily.  I suggested that we allow for a seated systolic blood pressure in the 160 range.  This will accommodate a 30 mm blood pressure dropped without getting too low.  In the event that he gets higher than that, augmented antihypertensive therapy would be appropriate perhaps in conjunction with an abdominal binder  More than 50% of 40 min was spent in counseling related to the above      Signed, Virl Axe, MD  05/30/2017 11:13 AM     [place] [address] [place] [address] [phone] (office) [phone] (fax)

## 2017-05-30 NOTE — Patient Instructions (Signed)
Medication Instructions: - Your physician has recommended you make the following change in your medication:  1) DECREASE norvasc (amlodipine) to 2.5 mg- take 1 tablet by mouth ONCE daily  Labwork: - none ordered  Procedures/Testing: - none ordered  Follow-Up: - Remote monitoring is used to monitor your Pacemaker of ICD from home. This monitoring reduces the number of office visits required to check your device to one time per year. It allows [place] to keep an eye on the functioning of your device to ensure it is working properly. You are scheduled for a device check from home on 08/29/17. You may send your transmission at any time that day. If you have a wireless device, the transmission will be sent automatically. After your physician reviews your transmission, you will receive a postcard with your next transmission date.  - Your physician wants you to follow-up in: 1 year with Dr. Caryl Comes. You will receive a reminder letter in the mail two months in advance. If you don't receive a letter, please call our office to schedule the follow-up appointment.   Any Additional Special Instructions Will Be Listed Below (If Applicable).     If you need a refill on your cardiac medications before your next appointment, please call your pharmacy.

## 2017-06-07 DIAGNOSIS — C672 Malignant neoplasm of lateral wall of bladder: Secondary | ICD-10-CM | POA: Diagnosis not present

## 2017-06-09 ENCOUNTER — Ambulatory Visit (INDEPENDENT_AMBULATORY_CARE_PROVIDER_SITE_OTHER): Payer: Medicare Other

## 2017-06-09 VITALS — BP 120/80 | HR 68 | Temp 98.5°F | Resp 16 | Ht 67.5 in | Wt 124.4 lb

## 2017-06-09 DIAGNOSIS — Z0000 Encounter for general adult medical examination without abnormal findings: Secondary | ICD-10-CM

## 2017-06-09 NOTE — Patient Instructions (Addendum)
  [name] , Thank you for taking time to come for your Medicare Wellness Visit. I appreciate your ongoing commitment to your health goals. Please review the following plan we discussed and let me know if I can assist you in the future.   These are the goals we discussed: Goals    . Increase physical activity     Do home physical therapy exercises as instructed Walk for exercise as tolerated       This is a list of the screening recommended for you and due dates:  Health Maintenance  Topic Date Due  . Tetanus Vaccine  03/21/1958  . DEXA scan (bone density measurement)  03/21/2004  . Pneumonia vaccines (2 of 2 - PPSV23) 09/17/2017*  . Flu Shot  Completed  *Topic was postponed. The date shown is not the original due date.

## 2017-06-09 NOTE — Progress Notes (Addendum)
Subjective:   [name] is a 77 y.o. female who presents for Medicare Annual (Subsequent) preventive examination.  Review of Systems:  No ROS.  Medicare Wellness Visit. Additional risk factors are reflected in the social history. Cardiac Risk Factors include: advanced age (>61men, >64 women);hypertension     Objective:     Vitals: BP 120/80 (BP Location: Left Arm, Patient Position: Sitting, Cuff Size: Normal)   Pulse 68   Temp 98.5 F (36.9 C) (Oral)   Resp 16   Ht 5' 7.5" (1.715 m)   Wt 124 lb 6.4 oz (56.4 kg)   SpO2 95%   BMI 19.20 kg/m   Body mass index is 19.2 kg/m.  Advanced Directives 06/09/2017 01/17/2017 01/04/2017 12/27/2016 06/03/2016 05/13/2016 10/02/2015  Does Patient Have a Medical Advance Directive? Yes Yes Yes Yes No Yes Yes  Type of Paramedic of [place];Living will [place];Living will - Living will;Healthcare Power of [place];Living will [place];Living will [place];Living will  Does patient want to make changes to medical advance directive? No - Patient declined - - - - No - Patient declined No - Patient declined  Copy of [place] in Chart? Yes - - - No - copy requested No - copy requested Yes  Would patient like information on creating a medical advance directive? - - - - No - Patient declined - -    Tobacco Social History   Tobacco Use  Smoking Status Current Every Day Smoker  . Packs/day: 0.50  . Years: 55.00  . Pack years: 27.50  . Types: Cigarettes  . Last attempt to quit: 11/22/2013  . Years since quitting: 3.5  Smokeless Tobacco Never Used     Ready to quit: No Counseling given: No   Clinical Intake:  Pre-visit preparation completed: Yes  Pain : No/denies pain     Nutritional Status: BMI of 19-24  Normal Diabetes: No  How often do you need to have someone help you when you read instructions,  pamphlets, or other written materials from your doctor or pharmacy?: 1 - Never  Interpreter Needed?: No     Past Medical History:  Diagnosis Date  . AICD (automatic cardioverter/defibrillator) present   . Anemia   . Arthritis    "in about all my joints; for sure in my back"  . Bladder cancer ([place]) 1995; 2015  . CHF (congestive heart failure) ([place])   . Chronic lower back pain   . COPD (chronic obstructive pulmonary disease) ([place])   . Ectopic atrial beats   . Frequent urination   . GERD (gastroesophageal reflux disease)    h/o ulcers  . History of stomach ulcers 11/2013  . Hypertension   . LBBB (left bundle branch block)   . Migraines    "stopped before menopause"   Past Surgical History:  Procedure Laterality Date  . BACK SURGERY    . BI-VENTRICULAR IMPLANTABLE CARDIOVERTER DEFIBRILLATOR N/A 05/07/2014   Procedure: BI-VENTRICULAR IMPLANTABLE CARDIOVERTER DEFIBRILLATOR  (CRT-D);  Surgeon: Deboraha Sprang, MD;  Location: [facility] CATH LAB;  Service: Cardiovascular;  Laterality: N/A;  . CARDIAC CATHETERIZATION  ~ 02/2014  . CARDIAC DEFIBRILLATOR PLACEMENT  05/07/2014   dual chamber  . CATARACT EXTRACTION W/ INTRAOCULAR LENS  IMPLANT, BILATERAL Bilateral 12-2013 - 02-2014  . CYSTOSCOPY W/ RETROGRADES Bilateral 01/06/2015   Procedure: CYSTOSCOPY WITH  BLADDER BIOPSY BILATERAL RETROGRADE PYELOGRAM,INSTILLATION OF MITOMYCIN C;  Surgeon: [name], MD;  [facility];  Service: Urology;  Laterality: Bilateral;  . CYSTOSCOPY WITH BIOPSY N/A 10/06/2015   Procedure: CYSTO WITH BLADDER BIOPSY, FULGERATION, CHEMO IRRIGATION EPIRUBICIN IN PACU;  Surgeon: [name], MD;  [facility];  Service: Urology;  Laterality: N/A;  . CYSTOSCOPY WITH RETROGRADE PYELOGRAM, URETEROSCOPY AND STENT PLACEMENT Bilateral 04/18/2014   Procedure: CYSTOSCOPY WITH RETROGRADE PYELOGRAM;  Surgeon: [name], MD;  [facility];  Service: Urology;  Laterality: Bilateral;  .  ESOPHAGOGASTRODUODENOSCOPY N/A 11/28/2013   Procedure: ESOPHAGOGASTRODUODENOSCOPY (EGD);  Surgeon: Arta Silence, MD;  Location: [facility] ENDOSCOPY;  Service: Endoscopy;  Laterality: N/A;  . LUMBAR [place]  1980's   "ruptured disc"  . TRANSURETHRAL RESECTION OF BLADDER TUMOR N/A 12/13/2013   Procedure: TRANSURETHRAL RESECTION OF BLADDER TUMOR (TURBT);  Surgeon: [name], MD;  [facility];  Service: Urology;  Laterality: N/A;  . TRANSURETHRAL RESECTION OF BLADDER TUMOR N/A 01/17/2014   Procedure: TRANSURETHRAL RESECTION OF BLADDER TUMOR (TURBT);  Surgeon: [name], MD;  [facility];  Service: Urology;  Laterality: N/A;  . TRANSURETHRAL RESECTION OF BLADDER TUMOR N/A 04/18/2014   Procedure: TRANSURETHRAL RESECTION OF BLADDER TUMOR (TURBT), CYSTOGRAM;  Surgeon: [name], MD;  [facility];  Service: Urology;  Laterality: N/A;   Family History  Problem Relation Age of Onset  . Cancer Mother   . Cancer Brother    Social History   Socioeconomic History  . Marital status: Widowed    Spouse name: Not on file  . Number of children: Not on file  . Years of education: Not on file  . Highest education level: Not on file  Occupational History  . Not on file  Social Needs  . Financial resource strain: Not hard at all  . Food insecurity:    Worry: Never true    Inability: Never true  . Transportation needs:    Medical: No    Non-medical: No  Tobacco Use  . Smoking status: Current Every Day Smoker    Packs/day: 0.50    Years: 55.00    Pack years: 27.50    Types: Cigarettes    Last attempt to quit: 11/22/2013    Years since quitting: 3.5  . Smokeless tobacco: Never Used  Substance and Sexual Activity  . Alcohol use: No  . Drug use: No  . Sexual activity: Never  Lifestyle  . Physical activity:    Days per week: Not on file    Minutes per session: Not on file  . Stress: Not on file  Relationships  . Social connections:    Talks on phone: Not on file     Gets together: Not on file    Attends religious service: Not on file    Active member of club or organization: Not on file    Attends meetings of clubs or organizations: Not on file    Relationship status: Not on file  Other Topics Concern  . Not on file  Social History Narrative  . Not on file    Outpatient Encounter Medications as of 06/09/2017  Medication Sig  . amLODipine (NORVASC) 2.5 MG tablet Take 1 tablet (2.5 mg total) by mouth daily.  . cyclobenzaprine (FLEXERIL) 5 MG tablet TAKE 1 TABLET(5 MG) BY MOUTH THREE TIMES DAILY AS NEEDED FOR MUSCLE SPASMS  . DULoxetine (CYMBALTA) 30 MG capsule TAKE 1 CAPSULE(30 MG) BY MOUTH DAILY  . gabapentin (NEURONTIN) 300 MG capsule Take 1 capsule (300 mg total) by mouth 2 (two) times daily. (Patient taking  differently: Take 300 mg by mouth daily. )  . HYDROcodone-acetaminophen (NORCO) 10-325 MG tablet Take 1 tablet by mouth every 4 (four) hours as needed for severe pain (Pain). (Patient taking differently: Take 1 tablet by mouth every 4 (four) hours as needed for severe pain (Pain).)  . lisinopril (PRINIVIL,ZESTRIL) 40 MG tablet TAKE 1 TABLET(40 MG) BY MOUTH DAILY  . metoprolol succinate (TOPROL-XL) 25 MG 24 hr tablet Take 1 tablet (25 mg total) by mouth daily.  . pantoprazole (PROTONIX) 40 MG tablet TAKE 1 TABLET(40 MG) BY MOUTH TWICE DAILY  . polyethylene glycol (MIRALAX / GLYCOLAX) packet Take 17 g by mouth daily as needed for mild constipation.   . promethazine (PHENERGAN) 12.5 MG tablet TAKE 1 TABLET BY MOUTH EVERY 8 HOURS AS NEEDED FOR NAUSEA OR VOMITING  . spironolactone (ALDACTONE) 25 MG tablet TAKE 1 TABLET BY MOUTH DAILY  . sucralfate (CARAFATE) 1 g tablet TAKE 1 TABLET BY MOUTH FOUR TIMES DAILY EVERY NIGHT AT BEDTIME WITH MEALS   No facility-administered encounter medications on file as of 06/09/2017.     Activities of Daily Living In your present state of health, do you have any difficulty performing the following activities:  06/09/2017  Hearing? N  Vision? N  Difficulty concentrating or making decisions? N  Walking or climbing stairs? Y  Comment Unsteady gait  Dressing or bathing? N  Doing errands, shopping? Y  Preparing Food and eating ? N  Using the Toilet? N  In the past six months, have you accidently leaked urine? Y  Comment Managed with a daily liner. Followed by Urology.  Do you have problems with loss of bowel control? N  Managing your Medications? N  Managing your Finances? N  Housekeeping or managing your Housekeeping? Y  Comment Daughter assists  Some recent data might be hidden    Patient Care Team: [name], MD as PCP - General (Internal Medicine) Briscoe Deutscher, MD (Family Medicine)    Assessment:   This is a routine wellness examination for [place].  The goal of the wellness visit is to assist the patient how to close the gaps in care and create a preventative care plan for the patient.   The roster of all physicians providing medical care to patient is listed in the Snapshot section of the chart.  Taking calcium VIT D as appropriate/Osteoporosis risk reviewed.  Dexa scan declined.  Safety issues reviewed; Lives with her daughter.  Smoke and carbon monoxide detectors in the home. No firearms in the home. Wears seatbelts when driving or riding with others. No violence in the home.  They do not have excessive sun exposure.  Discussed the need for sun protection: hats, long sleeves and the use of sunscreen if there is significant sun exposure.  Patient is alert, normal appearance, oriented to person/place/and time.  Correctly identified the president of the [place] and recalls of 3/3 words. Performs simple calculations and can read correct time from watch face. Displays appropriate judgement.  No new identified risk were noted.  No failures at ADL's or IADL's.  Ambulates with roll aid waker  BMI- discussed the importance of a healthy diet, water intake and the benefits of aerobic  exercise. Educational material provided.   24 hour diet recall: Regular diet as tolerated.  Ensure/boost supplemental drinks daily.   Dental- dentures  Eye- Visual acuity not assessed per patient preference since they have regular follow up with the ophthalmologist.  Wears corrective lenses.  Caffeine intake- 3-4 cups.  Sleep  patterns- Wakes through the night form nocturia x3-4.  Encouraged to stop drinking caffeine by 2:00 in the afternoon and other beverages 2 hours before bedtime.    TDAP vaccine deferred per patient preference.  Follow up with insurance.  Educational material provided.  Patient Concerns: None at this time. Follow up with PCP as needed.  Exercise Activities and Dietary recommendations Current Exercise Habits: Home exercise routine, Type of exercise: stretching(PT), Time (Minutes): 60, Frequency (Times/Week): 1, Weekly Exercise (Minutes/Week): 60, Intensity: Mild  Goals    . Increase physical activity     Do home physical therapy exercises as instructed Walk for exercise as tolerated       Fall Risk Fall Risk  06/09/2017 04/12/2017 02/16/2017 01/25/2017 01/17/2017  Falls in the past year? No No No No No   Depression Screen PHQ 2/9 Scores 06/09/2017 02/16/2017 01/25/2017 01/17/2017  PHQ - 2 Score 0 0 0 0     Cognitive Function     6CIT Screen 06/09/2017 05/13/2016  What Year? 0 points 0 points  What month? 0 points 0 points  What time? 0 points 0 points  Count back from 20 0 points 0 points  Months in reverse 0 points 0 points  Repeat phrase 0 points 0 points  Total Score 0 0    Immunization History  Administered Date(s) Administered  . Influenza,inj,Quad PF,6+ Mos 12/27/2013  . Influenza-Unspecified 12/24/2015, 02/17/2017  . Pneumococcal Conjugate-13 12/27/2013    Screening Tests Health Maintenance  Topic Date Due  . TETANUS/TDAP  03/21/1958  . DEXA SCAN  03/21/2004  . PNA vac Low Risk Adult (2 of 2 - PPSV23) 09/17/2017 (Originally  12/28/2014)  . INFLUENZA VACCINE  Completed       Plan:    End of life planning; Advance aging; Advanced directives discussed. Copy of current HCPOA/Living Will on file.    I have personally reviewed and noted the following in the patient's chart:   . Medical and social history . Use of alcohol, tobacco or illicit drugs  . Current medications and supplements . Functional ability and status . Nutritional status . Physical activity . Advanced directives . List of other physicians . Hospitalizations, surgeries, and ER visits in previous 12 months . Vitals . Screenings to include cognitive, depression, and falls . Referrals and appointments  In addition, I have reviewed and discussed with patient certain preventive protocols, quality metrics, and best practice recommendations. A written personalized care plan for preventive services as well as general preventive health recommendations were provided to patient.     OBrien-Blaney, Inga Noller L, LPN  07/02/2438   I have reviewed the above information and agree with above.   Deborra Medina, MD

## 2017-06-13 ENCOUNTER — Telehealth: Payer: Self-pay | Admitting: *Deleted

## 2017-06-13 ENCOUNTER — Other Ambulatory Visit: Payer: Self-pay | Admitting: Urology

## 2017-06-15 MED ORDER — SODIUM CHLORIDE 0.9 % IJ SOLN: 50.0000 mg | Freq: Once | INTRAVENOUS | Status: AC

## 2017-06-19 ENCOUNTER — Telehealth: Payer: Self-pay | Admitting: Student in an Organized Health Care Education/Training Program

## 2017-06-19 NOTE — Telephone Encounter (Signed)
Patient has seen heart doctor Dr. Caryl Comes [phone], Dr. Caryl Comes would like Dr. Holley Raring to call him regarding pain meds for patient before her appt

## 2017-06-21 NOTE — Pre-Procedure Instructions (Signed)
Discussed [name] medical history, AICD status, last ECHO and Dr. Seward Speck is ok with the case being done at [facility].

## 2017-06-26 ENCOUNTER — Other Ambulatory Visit: Payer: Self-pay | Admitting: Urology

## 2017-06-27 ENCOUNTER — Other Ambulatory Visit: Payer: Self-pay

## 2017-06-27 ENCOUNTER — Other Ambulatory Visit: Payer: Self-pay | Admitting: Urology

## 2017-06-27 ENCOUNTER — Encounter (HOSPITAL_BASED_OUTPATIENT_CLINIC_OR_DEPARTMENT_OTHER): Payer: Self-pay | Admitting: *Deleted

## 2017-06-27 NOTE — Progress Notes (Addendum)
SPOKE W/ PT VIA PHONE FOR PRE-OP INTERVIEW.  NPO AFTER MN.  ARRIVE AT 9702.  NEEDS ISTAT.  CURRENT EKG, ECHO, AND LOV NOTE FROM DR Caryl Comes AND DR [facility] IN CHART AND Epic.  WILL TAKE GABAPENTIN, TOPROL, NORVASC, PROTONIX , AND IF NEEDED TAKE NORCO AM DOS W/ SIPS OF WATER.  CHART WAS REVIEW BY ANESTHESIA , DR Lyndle Herrlich , [place] AT [facility] WITH DEVICE PHYSICIAN ORDER FROM DR Caryl Comes.  DEVICE FORM FILLED OUT AND FAXED 06-22-2017, RECEIVED BACK VIA FAXED SIGNED AND DATED 06-23-2017, PLACED IN CHART.

## 2017-06-28 ENCOUNTER — Other Ambulatory Visit: Payer: Self-pay | Admitting: Urology

## 2017-06-28 ENCOUNTER — Other Ambulatory Visit: Payer: Self-pay | Admitting: Internal Medicine

## 2017-06-29 NOTE — H&P (Signed)
@media  Print    Office Visit Report 06/07/2017    Bianca Reichmann. Anderson         MRN: [id number]  PRIMARY CARE:  [name]. Bianca Pro, MD  DOB: Jan 09, 1941, 77 year old Female  REFERRING:  Bianca Dover, MD  SSN: -[id number]  PROVIDER:  [name], M.D.    LOCATION:  [facility] [phone]    CC: I have bladder cancer.  HPI: [name] is a 77 year-old female established patient who is here for bladder cancer.  Her problem was diagnosed approximately 08/19/2013. The bladder cancer was found because of blood in her urine.   Her bladder cancer was treated by removal with scope. Patient denies removal of the entire bladder, radiation, and chemotherapy.   Her last cysto was 03/08/2017.   Her last U/S or CT Scan was 09/19/2016.   History of bladder cancer in 1995 when she saw Dr. Leory Anderson. Recurrence noted in 2015.   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  -Oct 2016 bbx/turbt - multifocal LG Ta ([facility] in pacu);  -Jul 2017 Cysto, bbx - LG Ta - [facility] in PACU   -Last upper tract - Oct 2016 - bilat RGP's; Jan 2016 bilateral RGP's - normal; Sept 2015 CT Hematuria - negative upper tracts, multiple bladder tumors.   Last BCG: maintenance Sep 2018; 11/2015 maintenance; reintroduced BCG Jan 2017 x 6   Last cytology: December 2018-clusters/atypical   She has been well. No gross hematuria.     ALLERGIES: No Allergies    MEDICATIONS: Lisinopril 40 mg tablet  Metoprolol Succinate 25 mg tablet, extended release 24 hr  Amlodipine Besylate 2.5 mg tablet  Carafate 1 gram tablet  Cymbalta  Gabapentin  Hydrocodone-Acetaminophen 10 mg-325 mg tablet Oral  Miralax 17 gram/dose powder 0 Oral  Multivitamin  Pantoprazole Sodium 40 mg tablet, delayed release Oral  Promethazine Hcl 12.5 mg tablet  Spironolactone     GU PSH: Bladder Instill AntiCA Agent - 12/15/2016, 12/08/2016, 11/22/2016, 06/14/2016, 06/07/2016, 05/31/2016, 12/01/2015, 11/24/2015,  11/17/2015, 10/06/2015, 01/15/2015, 2016 Cystoscopy - 03/08/2017, 08/31/2016, 02/24/2016, 09/15/2015 Cystoscopy TURBT >5 cm - 2016, 2015, 2015 Cystoscopy TURBT 2-5 cm - 10/06/2015, 01/15/2015 Locm 300-399Mg /Ml Iodine,1Ml - 09/19/2016      PSH Notes: Bladder Injection Of Cancer Treatment, Cystoscopy With Fulguration Medium Lesion (2-5cm), Cystoscopy With Fulguration Large Lesion (Over 5cm), Bladder Injection Of Cancer Treatment, Cystoscopy With Fulguration Large Lesion (Over 5cm), Bladder Surgery, Back Surgery, Cystoscopy With Fulguration Large Lesion (Over 5cm)   NON-GU PSH: None         GU PMH: Bladder Cancer Lateral - 03/08/2017, - 08/31/2016, - 02/24/2016, - 10/29/2015, - 09/15/2015, Malignant neoplasm of lateral wall of urinary bladder, - 12/10/2014 Dysuria - 05/24/2016 Bladder Cancer overlapping sites, Malignant neoplasm of overlapping sites of bladder - 2017 Bladder Cancer Dome, Malignant neoplasm of dome of bladder - 12/10/2014 Bladder Cancer Posterior, Malignant neoplasm of posterior wall of urinary bladder - 2015 Gross hematuria, Gross hematuria - 2015 History of bladder cancer, History of bladder cancer - 2015 Urethral Cancer, Malignant neoplasm of bladder neck - 2015     NON-GU PMH: Encounter for general adult medical examination without abnormal findings, Encounter for preventive health examination - 2017 Bacteriuria, Bacteriuria, asymptomatic - 2016 Other constipation, Chronic constipation - 2015 Personal history of other diseases of the circulatory system, History of cardiac disorder - 2015, History of cardiac arrhythmia, - 2015, History of hypertension, - 2015  Personal history of other diseases of the digestive system, History of gastric ulcer - 2015 Personal history of other diseases of the musculoskeletal system and connective tissue, History of degenerative disc disease - 2015     FAMILY HISTORY: Colon Cancer - Runs In Family Death - Runs In Family liver cancer - Runs In Family Lung  Cancer - Runs In Family    SOCIAL HISTORY: Marital Status: Widowed Preferred Language: English; Ethnicity: Not Hispanic Or Latino; Race: White Current Smoking Status: Patient smokes.  Has never drank.  Does not drink caffeine.      Notes: Current every day smoker, Six children, Alcohol use, Widowed, Caffeine use    REVIEW OF SYSTEMS:     GU Review Female:  Patient reports frequent urination, hard to postpone urination, and get up at night to urinate. Patient denies burning /pain with urination, leakage of urine, stream starts and stops, trouble starting your stream, have to strain to urinate, and being pregnant.    Gastrointestinal (Upper):  Patient denies nausea, vomiting, and indigestion/ heartburn.    Gastrointestinal (Lower):  Patient denies diarrhea and constipation.    Constitutional:  Patient denies fever, night sweats, weight loss, and fatigue.    Skin:  Patient denies skin rash/ lesion and itching.    Eyes:  Patient denies blurred vision and double vision.    Ears/ Nose/ Throat:  Patient denies sore throat and sinus problems.    Hematologic/Lymphatic:  Patient reports easy bruising. Patient denies swollen glands.    Cardiovascular:  Patient denies leg swelling and chest pains.    Respiratory:  Patient denies cough and shortness of breath.    Endocrine:  Patient denies excessive thirst.    Musculoskeletal:  Patient reports back pain. Patient denies joint pain.    Neurological:  Patient denies headaches and dizziness.    Psychologic:  Patient denies depression and anxiety.    VITAL SIGNS:       06/07/2017 01:54 PM     BP 128/82 mmHg     Pulse 71 /min     Temperature 98.0 F / 36.6 C     MULTI-SYSTEM PHYSICAL EXAMINATION:      Constitutional: Well-nourished. No physical deformities. Normally developed. Good grooming.     Neck: Neck symmetrical, not swollen. Normal tracheal position.     Respiratory: No labored breathing, no use of accessory muscles. Normal breath sounds.       Cardiovascular: Regular rate and rhythm. No murmur, no gallop. Normal temperature, normal extremity pulses, no swelling, no varicosities.     Neurologic / Psychiatric: Oriented to time, oriented to place, oriented to person. No depression, no anxiety, no agitation.     Gastrointestinal: No mass, no tenderness, no rigidity, non obese abdomen.            PAST DATA REVIEWED:   Source Of History:  Patient   PROCEDURES:    Flexible Cystoscopy - 52000  Risks, benefits, and some of the potential complications of the procedure were discussed at length with the patient including infection, bleeding, voiding discomfort, urinary retention, fever, chills, sepsis, and others. All questions were answered. Informed consent was obtained. Antibiotic prophylaxis was given. Sterile technique and intraurethral analgesia were used. Chaperone - - for exam and cystoscopy.   Meatus:  Normal size. Normal location. Normal condition.  Urethra:  No hypermobility. No leakage.  Ureteral Orifices:  Normal location. Normal size. Normal shape. Effluxed clear urine.  Bladder:  A few posterior wall tumors - small, early. No trabeculation.  Normal mucosa. No stones.      The lower urinary tract was carefully examined. The procedure was well-tolerated and without complications. Antibiotic instructions were given. Instructions were given to call the office immediately for bloody urine, difficulty urinating, urinary retention, painful or frequent urination, fever, chills, nausea, vomiting or other illness. The patient stated that she understood these instructions and would comply with them.    Urinalysis - 81003  Dipstick Dipstick Cont'd  Color: Yellow Bilirubin: Neg  Appearance: Clear Ketones: Neg  Specific Gravity: 1.010 Blood: Neg  pH: 6.0 Protein: Neg  Glucose: Neg Urobilinogen: 0.2   Nitrites: Neg   Leukocyte Esterase: Neg   Notes:       ASSESSMENT:     ICD-10 Details  1 GU:  Bladder Cancer Lateral - C67.2     PLAN:   Schedule  Return Visit/Planned Activity: Next Available Appointment - Schedule Surgery  Document  Letter(s):  Created for Patient: Clinical Summary   Notes:  Bladder cancer-a few early recurrences. These do seem to be Discussed the nature risks benefits and alternatives to cystoscopy with bladder biopsy and fulguration of postoperative epirubicin. She elects to proceed.   * Signed by [name], M.D. on 06/07/17 at 4:51 PM (EDT)*   The information contained in this medical record document is considered private and confidential patient information. This information can only be used for the medical diagnosis and/or medical services that are being provided by the patient's selected caregivers. This information can only be distributed outside of the patient's care if the patient agrees and signs waivers of authorization for this information to be sent to an outside source or route.

## 2017-06-30 ENCOUNTER — Ambulatory Visit (HOSPITAL_BASED_OUTPATIENT_CLINIC_OR_DEPARTMENT_OTHER)
Admission: RE | Admit: 2017-06-30 | Discharge: 2017-06-30 | Disposition: A | Discharge status: Home / Self Care | Payer: Medicare Other | Source: Ambulatory Visit | Attending: Urology | Admitting: Urology

## 2017-06-30 ENCOUNTER — Encounter (HOSPITAL_BASED_OUTPATIENT_CLINIC_OR_DEPARTMENT_OTHER): Payer: Self-pay | Admitting: *Deleted

## 2017-06-30 ENCOUNTER — Encounter (HOSPITAL_BASED_OUTPATIENT_CLINIC_OR_DEPARTMENT_OTHER)
Admission: RE | Disposition: A | Discharge status: Home / Self Care | Payer: Self-pay | Source: Ambulatory Visit | Attending: Urology

## 2017-06-30 ENCOUNTER — Ambulatory Visit (HOSPITAL_BASED_OUTPATIENT_CLINIC_OR_DEPARTMENT_OTHER): Payer: Medicare Other | Admitting: Anesthesiology

## 2017-06-30 ENCOUNTER — Other Ambulatory Visit: Payer: Self-pay

## 2017-06-30 DIAGNOSIS — Z801 Family history of malignant neoplasm of trachea, bronchus and lung: Secondary | ICD-10-CM | POA: Diagnosis not present

## 2017-06-30 DIAGNOSIS — K5909 Other constipation: Secondary | ICD-10-CM | POA: Diagnosis not present

## 2017-06-30 DIAGNOSIS — M1990 Unspecified osteoarthritis, unspecified site: Secondary | ICD-10-CM | POA: Insufficient documentation

## 2017-06-30 DIAGNOSIS — Z79899 Other long term (current) drug therapy: Secondary | ICD-10-CM | POA: Insufficient documentation

## 2017-06-30 DIAGNOSIS — F17200 Nicotine dependence, unspecified, uncomplicated: Secondary | ICD-10-CM | POA: Diagnosis not present

## 2017-06-30 DIAGNOSIS — C679 Malignant neoplasm of bladder, unspecified: Secondary | ICD-10-CM | POA: Diagnosis not present

## 2017-06-30 DIAGNOSIS — Z888 Allergy status to other drugs, medicaments and biological substances status: Secondary | ICD-10-CM | POA: Insufficient documentation

## 2017-06-30 DIAGNOSIS — C674 Malignant neoplasm of posterior wall of bladder: Secondary | ICD-10-CM | POA: Insufficient documentation

## 2017-06-30 DIAGNOSIS — J449 Chronic obstructive pulmonary disease, unspecified: Secondary | ICD-10-CM | POA: Insufficient documentation

## 2017-06-30 DIAGNOSIS — D090 Carcinoma in situ of bladder: Secondary | ICD-10-CM | POA: Diagnosis not present

## 2017-06-30 DIAGNOSIS — Z8551 Personal history of malignant neoplasm of bladder: Secondary | ICD-10-CM | POA: Insufficient documentation

## 2017-06-30 DIAGNOSIS — I499 Cardiac arrhythmia, unspecified: Secondary | ICD-10-CM | POA: Insufficient documentation

## 2017-06-30 DIAGNOSIS — K219 Gastro-esophageal reflux disease without esophagitis: Secondary | ICD-10-CM | POA: Diagnosis not present

## 2017-06-30 DIAGNOSIS — I2510 Atherosclerotic heart disease of native coronary artery without angina pectoris: Secondary | ICD-10-CM | POA: Diagnosis not present

## 2017-06-30 DIAGNOSIS — I509 Heart failure, unspecified: Secondary | ICD-10-CM | POA: Insufficient documentation

## 2017-06-30 DIAGNOSIS — Z950 Presence of cardiac pacemaker: Secondary | ICD-10-CM | POA: Insufficient documentation

## 2017-06-30 DIAGNOSIS — I110 Hypertensive heart disease with heart failure: Secondary | ICD-10-CM | POA: Insufficient documentation

## 2017-06-30 DIAGNOSIS — C672 Malignant neoplasm of lateral wall of bladder: Secondary | ICD-10-CM | POA: Diagnosis not present

## 2017-06-30 DIAGNOSIS — I10 Essential (primary) hypertension: Secondary | ICD-10-CM | POA: Diagnosis not present

## 2017-06-30 DIAGNOSIS — I420 Dilated cardiomyopathy: Secondary | ICD-10-CM | POA: Diagnosis not present

## 2017-06-30 DIAGNOSIS — D494 Neoplasm of unspecified behavior of bladder: Secondary | ICD-10-CM | POA: Diagnosis present

## 2017-06-30 DIAGNOSIS — Z800 Family history of malignant neoplasm of digestive organs: Secondary | ICD-10-CM | POA: Diagnosis not present

## 2017-06-30 DIAGNOSIS — Z8711 Personal history of peptic ulcer disease: Secondary | ICD-10-CM | POA: Insufficient documentation

## 2017-06-30 HISTORY — DX: Atherosclerotic heart disease of native coronary artery without angina pectoris: I25.10

## 2017-06-30 HISTORY — DX: Emphysema, unspecified: J43.9

## 2017-06-30 HISTORY — DX: Scoliosis, unspecified: M41.9

## 2017-06-30 HISTORY — DX: Unsteadiness on feet: R26.81

## 2017-06-30 HISTORY — DX: Chronic systolic (congestive) heart failure: I50.22

## 2017-06-30 HISTORY — DX: Other intervertebral disc degeneration, thoracolumbar region: M51.35

## 2017-06-30 HISTORY — DX: Personal history of diseases of the blood and blood-forming organs and certain disorders involving the immune mechanism: Z86.2

## 2017-06-30 HISTORY — DX: Complete loss of teeth, unspecified cause, unspecified class: K08.109

## 2017-06-30 HISTORY — DX: Nocturia: R35.1

## 2017-06-30 HISTORY — DX: Hypo-osmolality and hyponatremia: E87.1

## 2017-06-30 HISTORY — PX: CYSTOSCOPY WITH FULGERATION: SHX6638

## 2017-06-30 HISTORY — DX: Complete loss of teeth, unspecified cause, unspecified class: Z97.2

## 2017-06-30 HISTORY — DX: Other cardiomyopathies: I42.8

## 2017-06-30 LAB — POCT I-STAT 4, (NA,K, GLUC, HGB,HCT)
GLUCOSE: 97 mg/dL (ref 65–99)
HCT: 39 % (ref 36.0–46.0)
Hemoglobin: 13.3 g/dL (ref 12.0–15.0)
POTASSIUM: 4.2 mmol/L (ref 3.5–5.1)
SODIUM: 136 mmol/L (ref 135–145)

## 2017-06-30 SURGERY — CYSTOSCOPY, WITH BLADDER FULGURATION
Anesthesia: General

## 2017-06-30 MED ORDER — SODIUM CHLORIDE 0.9 % IR SOLN
Status: DC | PRN
  Administered 2017-06-30 (×2): 3000 mL via INTRAVESICAL

## 2017-06-30 MED ORDER — LIDOCAINE HCL (CARDIAC) 20 MG/ML IV SOLN
INTRAVENOUS | Status: DC | PRN
  Administered 2017-06-30: 60 mg via INTRAVENOUS

## 2017-06-30 MED ORDER — PROPOFOL 10 MG/ML IV BOLUS
INTRAVENOUS | Status: AC
  Filled 2017-06-30: qty 20

## 2017-06-30 MED ORDER — ONDANSETRON HCL 4 MG/2ML IJ SOLN
INTRAMUSCULAR | Status: AC
  Filled 2017-06-30: qty 2

## 2017-06-30 MED ORDER — LACTATED RINGERS IV SOLN
INTRAVENOUS | Status: DC
  Administered 2017-06-30: 09:00:00 via INTRAVENOUS
  Administered 2017-06-30: 1000 mL via INTRAVENOUS
  Filled 2017-06-30: qty 1000

## 2017-06-30 MED ORDER — PHENYLEPHRINE HCL 10 MG/ML IJ SOLN
INTRAMUSCULAR | Status: DC | PRN
  Administered 2017-06-30 (×2): 80 ug via INTRAVENOUS

## 2017-06-30 MED ORDER — DEXAMETHASONE SODIUM PHOSPHATE 10 MG/ML IJ SOLN
INTRAMUSCULAR | Status: DC | PRN
  Administered 2017-06-30: 4 mg via INTRAVENOUS

## 2017-06-30 MED ORDER — CEFAZOLIN SODIUM-DEXTROSE 2-4 GM/100ML-% IV SOLN
INTRAVENOUS | Status: AC
  Filled 2017-06-30: qty 100

## 2017-06-30 MED ORDER — ONDANSETRON HCL 4 MG/2ML IJ SOLN
INTRAMUSCULAR | Status: DC | PRN
  Administered 2017-06-30: 4 mg via INTRAVENOUS

## 2017-06-30 MED ORDER — CEFAZOLIN SODIUM-DEXTROSE 2-4 GM/100ML-% IV SOLN
2.0000 g | Freq: Once | INTRAVENOUS | Status: AC
  Administered 2017-06-30: 2 g via INTRAVENOUS
  Filled 2017-06-30: qty 100

## 2017-06-30 MED ORDER — PROPOFOL 10 MG/ML IV BOLUS
INTRAVENOUS | Status: DC | PRN
  Administered 2017-06-30: 150 mg via INTRAVENOUS

## 2017-06-30 MED ORDER — FENTANYL CITRATE (PF) 100 MCG/2ML IJ SOLN
INTRAMUSCULAR | Status: DC | PRN
  Administered 2017-06-30: 50 ug via INTRAVENOUS

## 2017-06-30 MED ORDER — FENTANYL CITRATE (PF) 100 MCG/2ML IJ SOLN
INTRAMUSCULAR | Status: AC
Start: 2017-06-30 — End: 2017-06-30
  Filled 2017-06-30: qty 2

## 2017-06-30 MED ORDER — SODIUM CHLORIDE 0.9 % IJ SOLN
50.0000 mg | Freq: Once | INTRAVENOUS | Status: AC
  Administered 2017-06-30: 50 mg via INTRAVESICAL
  Filled 2017-06-30: qty 25

## 2017-06-30 MED ORDER — DEXAMETHASONE SODIUM PHOSPHATE 10 MG/ML IJ SOLN
INTRAMUSCULAR | Status: AC
  Filled 2017-06-30: qty 1

## 2017-06-30 MED ORDER — PHENYLEPHRINE 40 MCG/ML (10ML) SYRINGE FOR IV PUSH (FOR BLOOD PRESSURE SUPPORT)
PREFILLED_SYRINGE | INTRAVENOUS | Status: AC
  Filled 2017-06-30: qty 10

## 2017-06-30 MED ORDER — PROMETHAZINE HCL 25 MG/ML IJ SOLN
6.2500 mg | INTRAMUSCULAR | Status: DC | PRN
  Filled 2017-06-30: qty 1

## 2017-06-30 MED ORDER — FENTANYL CITRATE (PF) 100 MCG/2ML IJ SOLN
25.0000 ug | INTRAMUSCULAR | Status: DC | PRN
  Filled 2017-06-30: qty 1

## 2017-06-30 SURGICAL SUPPLY — 28 items
BAG DRAIN URO-CYSTO SKYTR STRL (DRAIN) ×3 IMPLANT
BAG DRN ANRFLXCHMBR STRAP LEK (BAG)
BAG DRN UROCATH (DRAIN) ×1
BAG URINE DRAINAGE (UROLOGICAL SUPPLIES) ×2 IMPLANT
BAG URINE LEG 19OZ MD ST LTX (BAG) IMPLANT
CATH FOLEY 2WAY SLVR  5CC 16FR (CATHETERS) ×2
CATH FOLEY 2WAY SLVR  5CC 20FR (CATHETERS)
CATH FOLEY 2WAY SLVR  5CC 22FR (CATHETERS)
CATH FOLEY 2WAY SLVR 5CC 16FR (CATHETERS) IMPLANT
CATH FOLEY 2WAY SLVR 5CC 20FR (CATHETERS) IMPLANT
CATH FOLEY 2WAY SLVR 5CC 22FR (CATHETERS) IMPLANT
CLOTH BEACON ORANGE TIMEOUT ST (SAFETY) ×3 IMPLANT
CYSVIEW 100 KIT (MISCELLANEOUS) ×2 IMPLANT
DRSG TELFA 3X8 NADH (GAUZE/BANDAGES/DRESSINGS) IMPLANT
ELECT REM PT RETURN 9FT ADLT (ELECTROSURGICAL) ×3
ELECTRODE REM PT RTRN 9FT ADLT (ELECTROSURGICAL) ×1 IMPLANT
EVACUATOR MICROVAS BLADDER (UROLOGICAL SUPPLIES) IMPLANT
GLOVE BIO SURGEON STRL SZ7.5 (GLOVE) ×3 IMPLANT
GOWN STRL REUS W/TWL LRG LVL3 (GOWN DISPOSABLE) ×3 IMPLANT
HOLDER FOLEY CATH W/STRAP (MISCELLANEOUS) ×2 IMPLANT
KIT TURNOVER CYSTO (KITS) ×3 IMPLANT
LOOP CUT BIPOLAR 24F LRG (ELECTROSURGICAL) IMPLANT
MANIFOLD NEPTUNE II (INSTRUMENTS) IMPLANT
PACK CYSTO (CUSTOM PROCEDURE TRAY) ×3 IMPLANT
PAD DRESSING TELFA 3X8 NADH (GAUZE/BANDAGES/DRESSINGS) IMPLANT
PLUG CATH AND CAP STER (CATHETERS) IMPLANT
TUBE CONNECTING 12'X1/4 (SUCTIONS)
TUBE CONNECTING 12X1/4 (SUCTIONS) IMPLANT

## 2017-06-30 NOTE — Transfer of Care (Signed)
Immediate Anesthesia Transfer of Care Note  Patient: [name]  Procedure(s) Performed: [facility] Kitchen CYSTOSCOPY WITH Cysview FULGERATION/ BLADDER BIOPSY/ INSTILLATION OF EPIRUBICIN (N/A )  Patient Location: PACU  Anesthesia Type:General  Level of Consciousness: awake, alert  and oriented  Airway & Oxygen Therapy: Patient Spontanous Breathing and Patient connected to nasal cannula oxygen  Post-op Assessment: Report given to RN and Post -op Vital signs reviewed and stable  Post vital signs: Reviewed and stable  Last Vitals:  Vitals Value Taken Time  BP    Temp    Pulse 36 06/30/2017 11:19 AM  Resp    SpO2 81 % 06/30/2017 11:19 AM  Vitals shown include unvalidated device data.  Last Pain:  Vitals:   06/30/17 0844  TempSrc:   PainSc: 0-No pain      Patients Stated Pain Goal: 7 (95/18/84 1660)  Complications: No apparent anesthesia complications

## 2017-06-30 NOTE — Op Note (Signed)
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  She was brought today as she had some small recurrences to completely evaluate the bladder with cysview.  Findings: Several papillary tumors scattered about a 4 cm area in the posterior bladder.  These areas were fulgurated.  In the left inferior bladder there was another patch of early papillary tumor.  No other suspicious areas noted under white light or blue light.  Findings: After consent was obtained the patient was catheterized and cysview instilled in the preop area.  She was then brought to the operating room.  After adequate anesthesia she was placed in lithotomy position and prepped and draped in the usual fashion.  A timeout was performed to confirm the patient and procedure.  The cystoscope was passed per urethra and the bladder carefully inspected with a 30 degree and 70 degree lens under white light and blue light.  The posterior lesions were biopsied in 4-5 places and these were all sent together.  I then fulgurated this entire area with the Bugbee electrode.  Then the left posterior inferior biopsy was done with the flexible biopsy forceps.  This area was completely fulgurated.  The bladder was inspected under white light and blue light.  Under blue light there were a couple of minute areas of redness and these were spot cauterized.  Under white light and blue light no other suspicious areas were noted.  Hemostasis was excellent under low pressure.  The bladder was filled and the scope removed.  A 16 French Foley was placed and she was taken to the recovery room in stable condition.  Instillation of epirubicin in PACU: 50 mg of epirubicin was  instilled per urethra into the bladder and left to dwell for 50 minutes.  It was drained and then the Foley removed.  Complications: None  Blood loss: Minimal  Specimens to pathology: #1 posterior bladder biopsy #2 left bladder biopsy  Drains: 16 French Foley to be removed in recovery

## 2017-06-30 NOTE — Discharge Instructions (Signed)
Cystoscopy, Care After °Refer to this sheet in the next few weeks. These instructions provide you with information about caring for yourself after your procedure. Your health care provider may also give you more specific instructions. Your treatment has been planned according to current medical practices, but problems sometimes occur. Call your health care provider if you have any problems or questions after your procedure. °What can I expect after the procedure? °After the procedure, it is common to have: °· Mild pain when you urinate. Pain should stop within a few minutes after you urinate. This may last for up to 1 week. °· A small amount of blood in your urine for several days. °· Feeling like you need to urinate but producing only a small amount of urine. ° °Follow these instructions at home: ° °Medicines °· Take over-the-counter and prescription medicines only as told by your health care provider. °· If you were prescribed an antibiotic medicine, take it as told by your health care provider. Do not stop taking the antibiotic even if you start to feel better. °General instructions ° °· Return to your normal activities as told by your health care provider. Ask your health care provider what activities are safe for you. °· Do not drive for 24 hours if you received a sedative. °· Watch for any blood in your urine. If the amount of blood in your urine increases, call your health care provider. °· Follow instructions from your health care provider about eating or drinking restrictions. °· If a tissue sample was removed for testing (biopsy) during your procedure, it is your responsibility to get your test results. Ask your health care provider or the department performing the test when your results will be ready. °· Drink enough fluid to keep your urine clear or pale yellow. °· Keep all follow-up visits as told by your health care provider. This is important. °Contact a health care provider if: °· You have pain that  gets worse or does not get better with medicine, especially pain when you urinate. °· You have difficulty urinating. °Get help right away if: °· You have more blood in your urine. °· You have blood clots in your urine. °· You have abdominal pain. °· You have a fever or chills. °· You are unable to urinate. °This information is not intended to replace advice given to you by your health care provider. Make sure you discuss any questions you have with your health care provider. °Document Released: 09/24/2004 Document Revised: 08/13/2015 Document Reviewed: 01/22/2015 °Elsevier Interactive Patient Education © 2018 [employer] ° ° °Post Anesthesia Home Care Instructions ° °Activity: °Get plenty of rest for the remainder of the day. A responsible individual must stay with you for 24 hours following the procedure.  °For the next 24 hours, DO NOT: °-Drive a car °-Operate machinery °-Drink alcoholic beverages °-Take any medication unless instructed by your physician °-Make any legal decisions or sign important papers. ° °Meals: °Start with liquid foods such as gelatin or soup. Progress to regular foods as tolerated. Avoid greasy, spicy, heavy foods. If nausea and/or vomiting occur, drink only clear liquids until the nausea and/or vomiting subsides. Call your physician if vomiting continues. ° °Special Instructions/Symptoms: °Your throat may feel dry or sore from the anesthesia or the breathing tube placed in your throat during surgery. If this causes discomfort, gargle with warm salt water. The discomfort should disappear within 24 hours. °

## 2017-06-30 NOTE — Progress Notes (Signed)
In and out catheter inserted into patient with insertion of hexaminolevulinate hcl  50 cc.. Patient bladder emptied prior to instillation of medication. Urine removed by catheter 600 cc..Patient tolerated procedure well.

## 2017-06-30 NOTE — Anesthesia Procedure Notes (Signed)
Procedure Name: LMA Insertion Date/Time: 06/30/2017 10:26 AM Performed by: Jonna Munro, CRNA Pre-anesthesia Checklist: Patient identified, Emergency Drugs available, Suction available, Patient being monitored and Timeout performed Patient Re-evaluated:Patient Re-evaluated prior to induction Oxygen Delivery Method: Circle system utilized Preoxygenation: Pre-oxygenation with 100% oxygen Induction Type: IV induction LMA: LMA inserted LMA Size: 4.0 Number of attempts: 1 Placement Confirmation: positive ETCO2 and breath sounds checked- equal and bilateral Tube secured with: Tape Dental Injury: Teeth and Oropharynx as per pre-operative assessment

## 2017-06-30 NOTE — Interval H&P Note (Signed)
History and Physical Interval Note:  06/30/2017 10:15 AM  [name]  has presented today for surgery, with the diagnosis of BLADDER CANCER  The various methods of treatment have been discussed with the patient and family. After consideration of risks, benefits and other options for treatment, the patient has consented to  Procedure(s): [place] CYSTOSCOPY WITH Cysview FULGERATION/ BLADDER BIOPSY/ INSTILLATION OF EPIRUBICIN (N/A) as a surgical intervention .  Pt well today. No cough, colds, fever, chills or hematuria. Discussed with the patient the nature, potential benefits, risks and alternatives to above procedures/Cysview, epirubicin instillation, including side effects of the proposed treatment, the likelihood of the patient achieving the goals of the procedure, and any potential problems that might occur during the procedure or recuperation. The patient's history has been reviewed, patient examined, no change in status, stable for surgery.  I have reviewed the patient's chart and labs.  Questions were answered to the patient's satisfaction. She elects to proceed.    [name]

## 2017-06-30 NOTE — Anesthesia Preprocedure Evaluation (Signed)
Anesthesia Evaluation  Patient identified by MRN, date of birth, ID band Patient awake    Reviewed: Allergy & Precautions, NPO status , Patient's Chart, lab work & pertinent test results  Airway Mallampati: II  TM Distance: >3 FB Neck ROM: Full    Dental  (+) Dental Advisory Given   Pulmonary COPD, Current Smoker,    breath sounds clear to auscultation       Cardiovascular hypertension, Pt. on medications and Pt. on home beta blockers + CAD and +CHF  + dysrhythmias + pacemaker + Cardiac Defibrillator  Rhythm:Regular Rate:Normal     Neuro/Psych negative neurological ROS     GI/Hepatic Neg liver ROS, GERD  ,  Endo/Other  negative endocrine ROS  Renal/GU negative Renal ROS     Musculoskeletal  (+) Arthritis ,   Abdominal   Peds  Hematology negative hematology ROS (+)   Anesthesia Other Findings   Reproductive/Obstetrics                             Anesthesia Physical Anesthesia Plan  ASA: III  Anesthesia Plan: General   Post-op Pain Management:    Induction: Intravenous  PONV Risk Score and Plan: 2 and Ondansetron, Dexamethasone and Treatment may vary due to age or medical condition  Airway Management Planned: LMA  Additional Equipment:   Intra-op Plan:   Post-operative Plan: Extubation in OR  Informed Consent: I have reviewed the patients History and Physical, chart, labs and discussed the procedure including the risks, benefits and alternatives for the proposed anesthesia with the patient or authorized representative who has indicated his/her understanding and acceptance.   Dental advisory given  Plan Discussed with: CRNA  Anesthesia Plan Comments:         Anesthesia Quick Evaluation

## 2017-07-03 ENCOUNTER — Encounter (HOSPITAL_BASED_OUTPATIENT_CLINIC_OR_DEPARTMENT_OTHER): Payer: Self-pay | Admitting: Urology

## 2017-07-04 NOTE — Anesthesia Postprocedure Evaluation (Signed)
Anesthesia Post Note  Patient: [name]  Procedure(s) Performed: Marland Kitchen CYSTOSCOPY WITH Cysview FULGERATION/ BLADDER BIOPSY/ INSTILLATION OF EPIRUBICIN (N/A )     Patient location during evaluation: PACU Anesthesia Type: General Level of consciousness: awake and alert Pain management: pain level controlled Vital Signs Assessment: post-procedure vital signs reviewed and stable Respiratory status: spontaneous breathing, nonlabored ventilation, respiratory function stable and patient connected to nasal cannula oxygen Cardiovascular status: blood pressure returned to baseline and stable Postop Assessment: no apparent nausea or vomiting Anesthetic complications: no    Last Vitals:  Vitals:   06/30/17 1317 06/30/17 1454  BP: 147/83 (!) 158/81  Pulse: (!) 58 (!) 57  Resp: 16 16  Temp:  36.6 C  SpO2: 99% 98%    Last Pain:  Vitals:   06/30/17 1500  TempSrc:   PainSc: 0-No pain                 Tiajuana Amass

## 2017-07-11 ENCOUNTER — Encounter: Payer: Self-pay | Admitting: Student in an Organized Health Care Education/Training Program

## 2017-07-11 ENCOUNTER — Ambulatory Visit
Payer: Medicare Other | Attending: Student in an Organized Health Care Education/Training Program | Admitting: Student in an Organized Health Care Education/Training Program

## 2017-07-11 ENCOUNTER — Other Ambulatory Visit: Payer: Self-pay

## 2017-07-11 VITALS — BP 124/87 | HR 66 | Temp 98.0°F | Resp 18 | Ht 67.0 in | Wt 125.0 lb

## 2017-07-11 DIAGNOSIS — M5135 Other intervertebral disc degeneration, thoracolumbar region: Secondary | ICD-10-CM | POA: Insufficient documentation

## 2017-07-11 DIAGNOSIS — F17210 Nicotine dependence, cigarettes, uncomplicated: Secondary | ICD-10-CM | POA: Diagnosis not present

## 2017-07-11 DIAGNOSIS — Z9889 Other specified postprocedural states: Secondary | ICD-10-CM | POA: Diagnosis not present

## 2017-07-11 DIAGNOSIS — M461 Sacroiliitis, not elsewhere classified: Secondary | ICD-10-CM

## 2017-07-11 DIAGNOSIS — M4802 Spinal stenosis, cervical region: Secondary | ICD-10-CM | POA: Insufficient documentation

## 2017-07-11 DIAGNOSIS — Z79899 Other long term (current) drug therapy: Secondary | ICD-10-CM | POA: Diagnosis not present

## 2017-07-11 DIAGNOSIS — M47816 Spondylosis without myelopathy or radiculopathy, lumbar region: Secondary | ICD-10-CM

## 2017-07-11 DIAGNOSIS — G894 Chronic pain syndrome: Secondary | ICD-10-CM

## 2017-07-11 DIAGNOSIS — M5136 Other intervertebral disc degeneration, lumbar region: Secondary | ICD-10-CM | POA: Diagnosis not present

## 2017-07-11 DIAGNOSIS — I110 Hypertensive heart disease with heart failure: Secondary | ICD-10-CM | POA: Diagnosis not present

## 2017-07-11 DIAGNOSIS — M48062 Spinal stenosis, lumbar region with neurogenic claudication: Secondary | ICD-10-CM | POA: Diagnosis not present

## 2017-07-11 DIAGNOSIS — M47818 Spondylosis without myelopathy or radiculopathy, sacral and sacrococcygeal region: Secondary | ICD-10-CM | POA: Diagnosis not present

## 2017-07-11 DIAGNOSIS — M48061 Spinal stenosis, lumbar region without neurogenic claudication: Secondary | ICD-10-CM | POA: Insufficient documentation

## 2017-07-11 DIAGNOSIS — I5022 Chronic systolic (congestive) heart failure: Secondary | ICD-10-CM | POA: Diagnosis not present

## 2017-07-11 DIAGNOSIS — Z950 Presence of cardiac pacemaker: Secondary | ICD-10-CM | POA: Diagnosis not present

## 2017-07-11 DIAGNOSIS — Z5181 Encounter for therapeutic drug level monitoring: Secondary | ICD-10-CM | POA: Insufficient documentation

## 2017-07-11 DIAGNOSIS — J449 Chronic obstructive pulmonary disease, unspecified: Secondary | ICD-10-CM | POA: Insufficient documentation

## 2017-07-11 DIAGNOSIS — M51369 Other intervertebral disc degeneration, lumbar region without mention of lumbar back pain or lower extremity pain: Secondary | ICD-10-CM

## 2017-07-11 MED ORDER — OXYCODONE HCL 10 MG PO TABS: 10.0000 mg | ORAL_TABLET | Freq: Four times a day (QID) | ORAL | 0 refills | Status: DC | PRN

## 2017-07-11 NOTE — Progress Notes (Signed)
Nursing Pain Medication Assessment:  Safety precautions to be maintained throughout the outpatient stay will include: orient to surroundings, keep bed in low position, maintain call bell within reach at all times, provide assistance with transfer out of bed and ambulation.  Medication Inspection Compliance: Pill count conducted under aseptic conditions, in front of the patient. Neither the pills nor the bottle was removed from the patient's sight at any time. Once count was completed pills were immediately returned to the patient in their original bottle.  Medication: Hydrocodone/APAP Pill/Patch Count: 6 of 180 pills remain Pill/Patch Appearance: Markings consistent with prescribed medication Bottle Appearance: Standard pharmacy container. Clearly labeled. Filled Date:03 /22 / 2019 Last Medication intake:  Today

## 2017-07-11 NOTE — Progress Notes (Signed)
Nursing Pain Medication Assessment:  Safety precautions to be maintained throughout the outpatient stay will include: orient to surroundings, keep bed in low position, maintain call bell within reach at all times, provide assistance with transfer out of bed and ambulation.  Medication Inspection Compliance: [name] did not comply with our request to bring her pills to be counted. She was reminded that bringing the medication bottles, even when empty, is a requirement.  Medication: None brought in. Pill/Patch Count: None available to be counted. Bottle Appearance: No container available. Did not bring bottle(s) to appointment. Filled Date: N/A Last Medication intake:  Today  Reminded to bring to all appointments.

## 2017-07-11 NOTE — Progress Notes (Signed)
Patient's Name: [name]  MRN: [id number]  Referring Provider: [name], MD  DOB: February 27, 1941  PCP: [name], MD  DOS: 07/11/2017  Note by: [name], MD  Service setting: Ambulatory outpatient  Specialty: Interventional Pain Management  Location: [facility] (AMB) Pain Management Facility    Patient type: Established   Primary Reason(s) for Visit: Encounter for prescription drug management. (Level of risk: moderate)  CC: Back Pain (low)  HPI  [name] is a 77 y.o. year old, female patient, who comes today for a medication management evaluation. She has Palpitations; Hyponatremia; Bladder cancer ([place]); LBBB (left bundle branch block); Atrial ectopy; Congestive dilated cardiomyopathy ([place]); History of gastric ulcer; Spinal stenosis of thoracolumbar region; COPD (chronic obstructive pulmonary disease) ([place]); Constipation; Spinal stenosis in cervical region; Chronic bilateral low back pain; NSAID induced gastritis; Parkinsonian features; Hypertension; Spinal stenosis of lumbar region without neurogenic claudication; Other idiopathic scoliosis, lumbar region; Hyperlipidemia; S/P placement of cardiac pacemaker; Coronary artery disease; Lumbar spondylosis; SI joint arthritis; and Lumbar degenerative disc disease on their problem list. Her primarily concern today is the Back Pain (low)  Pain Assessment: Location: Lower Back Radiating: sometimes goes down left leg ion the back Onset: More than a month ago Duration:  Worse in the evening, present for hours Quality: Aching Severity: 8 /10 (self-reported pain score)  Note: Reported level is inconsistent with clinical observations.                         When using our objective Pain Scale, levels between 6 and 10/10 are said to belong in an emergency room, as it progressively worsens from a 6/10, described as severely limiting, requiring emergency care not usually available at an outpatient pain management facility. At a 6/10 level, communication  becomes difficult and requires great effort. Assistance to reach the emergency department may be required. Facial flushing and profuse sweating along with potentially dangerous increases in heart rate and blood pressure will be evident. Effect on ADL:   Timing: Constant Modifying factors: medicine, lying down  [name] was last scheduled for an appointment on 06/19/2017 for medication management. During today's appointment we reviewed [name] chronic pain status, as well as her outpatient medication regimen.  Patient returns today for medication refill.  She states that she experiences increasing pain at approximately our 3 and 4 after intake of hydrocodone.  Patient could be experiencing end of dose failure.  Is interested in trialing oxycodone.  The patient  reports that she does not use drugs. Her body mass index is 19.58 kg/m.  Further details on both, my assessment(s), as well as the proposed treatment pla 77 year old female with a history of chronic pain secondary to lumbar spondylosis, degenerative disc disease, SI joint arthritis, chronic pain syndrome.  n, please see below.  Controlled Substance Pharmacotherapy Assessment REMS (Risk Evaluation and Mitigation Strategy)  Analgesic: Hydrocodone 10 mg up to 6 times a day as needed, quantity 180 MME/day: 60 mg/day.  Landis Martins, RN  07/11/2017  2:25 PM  Sign at close encounter Nursing Pain Medication Assessment:  Safety precautions to be maintained throughout the outpatient stay will include: orient to surroundings, keep bed in low position, maintain call bell within reach at all times, provide assistance with transfer out of bed and ambulation.  Medication Inspection Compliance: Pill count conducted under aseptic conditions, in front of the patient. Neither the pills nor the bottle was removed from the patient's sight at any  time. Once count was completed pills were immediately returned to the patient in their original  bottle.  Medication: Hydrocodone/APAP Pill/Patch Count: 6 of 180 pills remain Pill/Patch Appearance: Markings consistent with prescribed medication Bottle Appearance: Standard pharmacy container. Clearly labeled. Filled Date:03 /22 / 2019 Last Medication intake:  Today  Dewayne Shorter, RN  07/11/2017  1:07 PM  Signed Nursing Pain Medication Assessment:  Safety precautions to be maintained throughout the outpatient stay will include: orient to surroundings, keep bed in low position, maintain call bell within reach at all times, provide assistance with transfer out of bed and ambulation.  Medication Inspection Compliance: [name] did not comply with our request to bring her pills to be counted. She was reminded that bringing the medication bottles, even when empty, is a requirement.  Medication: None brought in. Pill/Patch Count: None available to be counted. Bottle Appearance: No container available. Did not bring bottle(s) to appointment. Filled Date: N/A Last Medication intake:  Today  Reminded to bring to all appointments.    Pharmacokinetics: Liberation and absorption (onset of action): WNL Distribution (time to peak effect): WNL Metabolism and excretion (duration of action): WNL         Pharmacodynamics: Desired effects: Analgesia: [name] reports 50% benefit. Functional ability: Patient reports that medication does help, but not nearly as much as she would like Clinically meaningful improvement in function (CMIF): Sustained CMIF goals met Perceived effectiveness: Described as ineffective and would like to make some changes Undesirable effects: Side-effects or Adverse reactions: None reported Monitoring: [place] PMP: Online review of the past 23-monthperiod conducted. Compliant with practice rules and regulations Last UDS on record: Summary  Date Value Ref Range Status  12/27/2016 FINAL  Final    Comment:     ==================================================================== [facility]RE COMP DRUG ANALYSIS,UR ==================================================================== Test                             Result       Flag       Units Drug Present and Declared for Prescription Verification   Hydrocodone                    3156         EXPECTED   ng/mg creat   Hydromorphone                  848          EXPECTED   ng/mg creat   Dihydrocodeine                 236          EXPECTED   ng/mg creat   Norhydrocodone                 3780         EXPECTED   ng/mg creat    Sources of hydrocodone include scheduled prescription    medications. Hydromorphone, dihydrocodeine and norhydrocodone are    expected metabolites of hydrocodone. Hydromorphone and    dihydrocodeine are also available as scheduled prescription    medications.   Gabapentin                     PRESENT      EXPECTED   Cyclobenzaprine                PRESENT      EXPECTED   Desmethylcyclobenzaprine  PRESENT      EXPECTED    Desmethylcyclobenzaprine is an expected metabolite of    cyclobenzaprine.   Acetaminophen                  PRESENT      EXPECTED   Metoprolol                     PRESENT      EXPECTED Drug Absent but Declared for Prescription Verification   Duloxetine                     Not Detected UNEXPECTED   Promethazine                   Not Detected UNEXPECTED ==================================================================== Test                      Result    Flag   Units      Ref Range   Creatinine              25               mg/dL      >=20 ==================================================================== Declared Medications:  The flagging and interpretation on this report are based on the  following declared medications.  Unexpected results may arise from  inaccuracies in the declared medications.  **Note: The testing scope of this panel includes these medications:  Cyclobenzaprine (Flexeril)   Duloxetine (Cymbalta)  Gabapentin (Neurontin)  Hydrocodone (Norco)  Metoprolol (Toprol)  Promethazine (Phenergan)  **Note: The testing scope of this panel does not include small to  moderate amounts of these reported medications:  Acetaminophen (Norco)  **Note: The testing scope of this panel does not include following  reported medications:  Amlodipine (Norvasc)  Lisinopril (Prinivil)  Pantoprazole (Protonix)  Polyethylene Glycol (MiraLAX)  Spironolactone (Aldactone)  Sucralfate (Carafate) ==================================================================== For clinical consultation, please call [phone]. ====================================================================    UDS interpretation: Compliant          Medication Assessment Form: Reviewed. Patient indicates being compliant with therapy Treatment compliance: Compliant Risk Assessment Profile: Aberrant behavior: See prior evaluations. None observed or detected today Comorbid factors increasing risk of overdose: See prior notes. No additional risks detected today Risk of substance use disorder (SUD): Low Opioid Risk Tool - 07/11/17 1304      Family History of Substance Abuse   Alcohol  Negative    Illegal Drugs  Negative    Rx Drugs  Negative      Personal History of Substance Abuse   Alcohol  Negative    Illegal Drugs  Negative    Rx Drugs  Negative      Age   Age between 79-45 years   No      History of Preadolescent Sexual Abuse   History of Preadolescent Sexual Abuse  Negative or Female      Psychological Disease   Psychological Disease  Negative    Depression  Positive      Total Score   Opioid Risk Tool Scoring  1    Opioid Risk Interpretation  Low Risk      ORT Scoring interpretation table:  Score <3 = Low Risk for SUD  Score between 4-7 = Moderate Risk for SUD  Score >8 = High Risk for Opioid Abuse   Risk Mitigation Strategies:  Patient Counseling: Covered Patient-Prescriber  Agreement (PPA): Present and active  Notification to other healthcare providers: Done  Pharmacologic Plan: will have patient discontinue Hydrocodone, will trial Oxycodone 10 mg 4 times daily as needed             Laboratory Chemistry  Inflammation Markers (CRP: Acute Phase) (ESR: Chronic Phase) No results found for: CRP, ESRSEDRATE, LATICACIDVEN                       Rheumatology Markers No results found for: Elayne Guerin, [facility]                      Renal Function Markers Lab Results  Component Value Date   BUN 11 03/24/2017   CREATININE 0.73 03/24/2017   GFRAA >60 10/02/2015   GFRNONAA >60 10/02/2015                              Hepatic Function Markers Lab Results  Component Value Date   AST 25 03/24/2017   ALT 17 03/24/2017   ALBUMIN 4.4 03/24/2017   ALKPHOS 105 03/24/2017   LIPASE 30 11/23/2013                        Electrolytes Lab Results  Component Value Date   NA 136 06/30/2017   K 4.2 06/30/2017   CL 99 03/24/2017   CALCIUM 9.9 03/24/2017                        Neuropathy Markers Lab Results  Component Value Date   VITAMINB12 1,069 (H) 12/27/2013   HGBA1C 4.6 12/27/2013                        Bone Pathology Markers No results found for: VD25OH, [id number], [id number], [id number], 25OHVITD1, 25OHVITD2, 25OHVITD3, TESTOFREE, TESTOSTERONE                       Coagulation Parameters Lab Results  Component Value Date   INR 1.0 04/28/2014   LABPROT 10.3 04/28/2014   APTT 27 11/22/2013   PLT 209 10/02/2015                        Cardiovascular Markers Lab Results  Component Value Date   CKTOTAL 87 09/01/2010   CKMB 4.1 (H) 09/01/2010   TROPONINI <0.30 11/23/2013   HGB 13.3 06/30/2017   HCT 39.0 06/30/2017                         CA Markers No results found for: CEA, CA125, LABCA2                      Note: Lab results reviewed.  Recent Diagnostic Imaging Results  DG C-Arm 1-60 Min-No Report Fluoroscopy was  utilized by the requesting physician.  No radiographic  interpretation.   Complexity Note: Imaging results reviewed. Results shared with Ms. Meditz, using Layman's terms.                         Meds   Current Outpatient Medications:  .  cyclobenzaprine (FLEXERIL) 5 MG tablet, TAKE 1 TABLET(5 MG) BY MOUTH THREE TIMES DAILY AS NEEDED FOR MUSCLE SPASMS, Disp: 90 tablet, Rfl: 0 .  DULoxetine (CYMBALTA) 30 MG capsule, TAKE 1 CAPSULE(30 MG) BY MOUTH DAILY,  Disp: 30 capsule, Rfl: 0 .  gabapentin (NEURONTIN) 300 MG capsule, Take 1 capsule (300 mg total) by mouth 2 (two) times daily. (Patient taking differently: Take 300 mg by mouth daily. ), Disp: 60 capsule, Rfl: 2 .  lisinopril (PRINIVIL,ZESTRIL) 40 MG tablet, TAKE 1 TABLET(40 MG) BY MOUTH DAILY (Patient taking differently: TAKE 1 TABLET(40 MG) BY MOUTH DAILY--- takes in am), Disp: 90 tablet, Rfl: 1 .  metoprolol succinate (TOPROL-XL) 25 MG 24 hr tablet, Take 1 tablet (25 mg total) by mouth daily. (Patient taking differently: Take 25 mg by mouth every morning. ), Disp: 30 tablet, Rfl: 10 .  pantoprazole (PROTONIX) 40 MG tablet, TAKE 1 TABLET(40 MG) BY MOUTH TWICE DAILY (Patient taking differently: Take 40 mg by mouth 2 (two) times daily. TAKE 1 TABLET(40 MG) BY MOUTH TWICE DAILY), Disp: 60 tablet, Rfl: 2 .  polyethylene glycol (MIRALAX / GLYCOLAX) packet, Take 17 g by mouth daily as needed for mild constipation. , Disp: , Rfl:  .  promethazine (PHENERGAN) 12.5 MG tablet, TAKE 1 TABLET BY MOUTH EVERY 8 HOURS AS NEEDED FOR NAUSEA OR VOMITING, Disp: 60 tablet, Rfl: 0 .  spironolactone (ALDACTONE) 25 MG tablet, TAKE 1 TABLET BY MOUTH DAILY (Patient taking differently: TAKE 1 TABLET BY MOUTH DAILY--- per pt only takes when she has swelling), Disp: 30 tablet, Rfl: 5 .  sucralfate (CARAFATE) 1 g tablet, TAKE 1 TABLET BY MOUTH FOUR TIMES DAILY EVERY NIGHT AT BEDTIME WITH MEALS, Disp: 120 tablet, Rfl: 1 .  [START ON 07/12/2017] Oxycodone HCl 10 MG TABS, Take 1  tablet (10 mg total) by mouth every 6 (six) hours as needed. For chronic pain To last for 30 days from fill date, Disp: 120 tablet, Rfl: 0 No current facility-administered medications for this visit.   Facility-Administered Medications Ordered in Other Visits:  .  epirubicin (ELLENCE) 50 mg in sodium chloride 0.9 % bladder instillation, 50 mg, Bladder Instillation, Once, [name], MD  ROS  Constitutional: Denies any fever or chills Gastrointestinal: No reported hemesis, hematochezia, vomiting, or acute GI distress Musculoskeletal: Denies any acute onset joint swelling, redness, loss of ROM, or weakness Neurological: No reported episodes of acute onset apraxia, aphasia, dysarthria, agnosia, amnesia, paralysis, loss of coordination, or loss of consciousness  Allergies  [name] has No Known Allergies.  [place]  Drug: [name]  reports that she does not use drugs. Alcohol:  reports that she does not drink alcohol. Tobacco:  reports that she has been smoking cigarettes.  She has a 27.50 pack-year smoking history. She has never used smokeless tobacco. Medical:  has a past medical history of AICD (automatic cardioverter/defibrillator) present (placement 05/07/2014 medtronic    ), Arthritis, Bladder cancer ([place]) (urologist-  dr Junious Silk), Chronic hyponatremia, Chronic lower back pain, Chronic systolic (congestive) heart failure ([place]), COPD with emphysema ([place]), Coronary artery disease (cardiologist-  dr Kathlyn [place]), DDD (degenerative disc disease), thoracolumbar, Ectopic atrial beats, Frequent urination, Full dentures, Gait instability, GERD (gastroesophageal reflux disease), History of iron deficiency anemia (11/2013), History of stomach ulcers (11/2013), Hypertension, LBBB (left bundle branch block), NICM (nonischemic cardiomyopathy) ([place]) (last echo 12-31-2015 ef 55-60%), Nocturia more than twice per night, and Scoliosis. Surgical: Ms. Stare  has a past surgical history that includes  Esophagogastroduodenoscopy (N/A, 11/28/2013); Transurethral resection of bladder tumor (N/A, 12/13/2013); Transurethral resection of bladder tumor (N/A, 01/17/2014); Cataract extraction w/ intraocular lens  implant, bilateral (Bilateral, right 12-2013 / left  02-2014); Cystoscopy with retrograde pyelogram, ureteroscopy and stent placement (Bilateral, 04/18/2014); Transurethral  resection of bladder tumor (N/A, 04/18/2014); Lumbar disc surgery (1980's); bi-ventricular implantable cardioverter defibrillator (N/A, 05/07/2014); Cystoscopy w/ retrogrades (Bilateral, 01/06/2015); Cystoscopy with biopsy (N/A, 10/06/2015); Cardiac catheterization (03-03-2014  dr Kathlyn [place]   [facility]); Transurethral resection of bladder (1995); Cardiovascular stress test (11/25/2013); transthoracic echocardiogram (12/31/2015   dr Caryl Comes); and Cystoscopy with fulgeration (N/A, 06/30/2017). Family: family history includes Cancer in her brother and mother.  Constitutional Exam  General appearance: Well nourished, well developed, and well hydrated. In no apparent acute distress Vitals:   07/11/17 1300  BP: 124/87  Pulse: 66  Resp: 18  Temp: 98 F (36.7 C)  SpO2: 98%  Weight: 125 lb (56.7 kg)  Height: 5' 7"  (1.702 m)   BMI Assessment: Estimated body mass index is 19.58 kg/m as calculated from the following:   Height as of this encounter: 5' 7"  (1.702 m).   Weight as of this encounter: 125 lb (56.7 kg).  BMI interpretation table: BMI level Category Range association with higher incidence of chronic pain  <18 kg/m2 Underweight   18.5-24.9 kg/m2 Ideal body weight   25-29.9 kg/m2 Overweight Increased incidence by 20%  30-34.9 kg/m2 Obese (Class I) Increased incidence by 68%  35-39.9 kg/m2 Severe obesity (Class II) Increased incidence by 136%  >40 kg/m2 Extreme obesity (Class III) Increased incidence by 254%   BMI Readings from Last 4 Encounters:  07/11/17 19.58 kg/m  06/30/17 18.69 kg/m  06/09/17 19.20 kg/m  05/30/17  18.89 kg/m   Wt Readings from Last 4 Encounters:  07/11/17 125 lb (56.7 kg)  06/30/17 121 lb 1.6 oz (54.9 kg)  06/09/17 124 lb 6.4 oz (56.4 kg)  05/30/17 124 lb 4 oz (56.4 kg)  Psych/Mental status: Alert, oriented x 3 (person, place, & time)       Eyes: PERLA Respiratory: No evidence of acute respiratory distress  Cervical Spine Area Exam  Skin & Axial Inspection: No masses, redness, edema, swelling, or associated skin lesions Alignment: Symmetrical Functional ROM: Unrestricted ROM      Stability: No instability detected Muscle Tone/Strength: Functionally intact. No obvious neuro-muscular anomalies detected. Sensory (Neurological): Unimpaired Palpation: No palpable anomalies              Upper Extremity (UE) Exam    Side: Right upper extremity  Side: Left upper extremity  Skin & Extremity Inspection: Skin color, temperature, and hair growth are WNL. No peripheral edema or cyanosis. No masses, redness, swelling, asymmetry, or associated skin lesions. No contractures.  Skin & Extremity Inspection: Skin color, temperature, and hair growth are WNL. No peripheral edema or cyanosis. No masses, redness, swelling, asymmetry, or associated skin lesions. No contractures.  Functional ROM: Unrestricted ROM          Functional ROM: Unrestricted ROM          Muscle Tone/Strength: Functionally intact. No obvious neuro-muscular anomalies detected.  Muscle Tone/Strength: Functionally intact. No obvious neuro-muscular anomalies detected.  Sensory (Neurological): Unimpaired          Sensory (Neurological): Unimpaired          Palpation: No palpable anomalies              Palpation: No palpable anomalies              Specialized Test(s): Deferred         Specialized Test(s): Deferred          Thoracic Spine Area Exam  Skin & Axial Inspection: No masses, redness, or swelling Alignment: Symmetrical Functional ROM: Unrestricted ROM  Stability: No instability detected Muscle Tone/Strength: Functionally  intact. No obvious neuro-muscular anomalies detected. Sensory (Neurological): Unimpaired Muscle strength & Tone: No palpable anomalies  Lumbar Spine Area Exam  Skin & Axial Inspection: No masses, redness, or swelling Alignment: Asymmetric Functional ROM: Decreased ROM       Stability: No instability detected Muscle Tone/Strength: Functionally intact. No obvious neuro-muscular anomalies detected. Sensory (Neurological): Musculoskeletal pain pattern Palpation: Complains of area being tender to palpation       Provocative Tests: Lumbar Hyperextension and rotation test: Positive bilaterally for facet joint pain. Lumbar Lateral bending test: Positive due to pain. Patrick's Maneuver: evaluation deferred today                    Gait & Posture Assessment  Ambulation: Unassisted Gait: Relatively normal for age and body habitus Posture: WNL   Lower Extremity Exam    Side: Right lower extremity  Side: Left lower extremity  Skin & Extremity Inspection: Skin color, temperature, and hair growth are WNL. No peripheral edema or cyanosis. No masses, redness, swelling, asymmetry, or associated skin lesions. No contractures.  Skin & Extremity Inspection: Skin color, temperature, and hair growth are WNL. No peripheral edema or cyanosis. No masses, redness, swelling, asymmetry, or associated skin lesions. No contractures.  Functional ROM: Unrestricted ROM          Functional ROM: Unrestricted ROM          Muscle Tone/Strength: Functionally intact. No obvious neuro-muscular anomalies detected.  Muscle Tone/Strength: Functionally intact. No obvious neuro-muscular anomalies detected.  Sensory (Neurological): Unimpaired  Sensory (Neurological): Unimpaired  Palpation: No palpable anomalies  Palpation: No palpable anomalies   Assessment  Primary Diagnosis & Pertinent Problem List: The primary encounter diagnosis was Lumbar spondylosis. Diagnoses of SI joint arthritis, Lumbar degenerative disc disease, Chronic  pain syndrome, Spinal stenosis, lumbar region, with neurogenic claudication, and Lumbar facet arthropathy were also pertinent to this visit.  Status Diagnosis  Persistent Persistent Persistent 1. Lumbar spondylosis   2. SI joint arthritis   3. Lumbar degenerative disc disease   4. Chronic pain syndrome   5. Spinal stenosis, lumbar region, with neurogenic claudication   6. Lumbar facet arthropathy     Problems updated and reviewed during this visit: Problem  Lumbar Spondylosis  Si Joint Arthritis  Lumbar Degenerative Disc Disease  General Recommendations: The pain condition that the patient suffers from is best treated with a multidisciplinary approach that involves an increase in physical activity to prevent de-conditioning and worsening of the pain cycle, as well as psychological counseling (formal and/or informal) to address the co-morbid psychological affects of pain. Treatment will often involve judicious use of pain medications and interventional procedures to decrease the pain, allowing the patient to participate in the physical activity that will ultimately produce long-lasting pain reductions. The goal of the multidisciplinary approach is to return the patient to a higher level of overall function and to restore their ability to perform activities of daily living.   77 year old female with a history of axial low back pain secondary to lumbar spondylosis, degenerative disc disease, SI joint arthritis.  Patient presents today for medication refill.  She is endorsing dose failure at approximately hour 4 after intake of her hydrocodone.  Today we discussed changing her chronic opioid therapy to oxycodone.  We discussed alternatives such as oxycodone, morphine, methadone, Dilaudid.  I would not recommend morphine given its side effect profile and I do not recommend Dilaudid in patients greater than 65 given the  risk of respiratory depression and cognitive disturbances that can be seen with  this medication.  Furthermore my concern of methadone is the risk of QTC prolongation in a patient at 77 years old She has trialed oxycodone 5 mill grams in the past which was not very effective.  We discussed trialing an increased dose at 10 mg QID prn, which the patient is open to.   Plan: -Discontinue hydrocodone  -Rx for Oxycodone 10 mg QID prn (#120/month), MME= 60 -Continue Flexeril, Gabapentin as previously prescribed.  Plan of Care  Pharmacotherapy (Medications Ordered): Meds ordered this encounter  Medications  . Oxycodone HCl 10 MG TABS    Sig: Take 1 tablet (10 mg total) by mouth every 6 (six) hours as needed. For chronic pain To last for 30 days from fill date    Dispense:  120 tablet    Refill:  0    Do not place this medication, or any other prescription from our practice, on "Automatic Refill". Patient may have prescription filled one day early if pharmacy is closed on scheduled refill date. Do not fill until:  To last until:   Provider-requested follow-up: Return in about 3 weeks (around 08/01/2017) for Medication Management. Time Note: Greater than 50% of the 25 minute(s) of face-to-face time spent with [name], was spent in counseling/coordination of care regarding: the appropriate use of the pain scale, opioid tolerance, [name] primary cause of pain, the treatment plan, medication side effects, the opioid analgesic risks and possible complications, the appropriate use of her medications, realistic expectations and the patient's responsibilities when it comes to controlled substances. Future Appointments  Date Time Provider [place]  07/31/2017 12:15 PM [name], MD [facility] None  08/29/2017 11:15 AM [facility] DEVICE REMOTES [facility]  06/13/2018  3:00 PM O'Brien-Blaney, Bryson Corona, LPN [facility]    Primary Care Physician: [name], MD Location: [facility] Note by: [name], M.D Date:  07/11/2017; Time: 2:32 PM  There are no Patient Instructions on file for this visit.

## 2017-07-31 ENCOUNTER — Encounter: Payer: Self-pay | Admitting: Student in an Organized Health Care Education/Training Program

## 2017-07-31 ENCOUNTER — Ambulatory Visit
Payer: Medicare Other | Attending: Student in an Organized Health Care Education/Training Program | Admitting: Student in an Organized Health Care Education/Training Program

## 2017-07-31 ENCOUNTER — Other Ambulatory Visit: Payer: Self-pay

## 2017-07-31 VITALS — BP 148/91 | HR 71 | Temp 98.1°F | Resp 16 | Ht 68.0 in | Wt 125.0 lb

## 2017-07-31 DIAGNOSIS — I2510 Atherosclerotic heart disease of native coronary artery without angina pectoris: Secondary | ICD-10-CM | POA: Insufficient documentation

## 2017-07-31 DIAGNOSIS — Z79891 Long term (current) use of opiate analgesic: Secondary | ICD-10-CM | POA: Insufficient documentation

## 2017-07-31 DIAGNOSIS — M48061 Spinal stenosis, lumbar region without neurogenic claudication: Secondary | ICD-10-CM | POA: Insufficient documentation

## 2017-07-31 DIAGNOSIS — Z95810 Presence of automatic (implantable) cardiac defibrillator: Secondary | ICD-10-CM | POA: Insufficient documentation

## 2017-07-31 DIAGNOSIS — K219 Gastro-esophageal reflux disease without esophagitis: Secondary | ICD-10-CM | POA: Diagnosis not present

## 2017-07-31 DIAGNOSIS — M545 Low back pain: Secondary | ICD-10-CM | POA: Diagnosis present

## 2017-07-31 DIAGNOSIS — Z961 Presence of intraocular lens: Secondary | ICD-10-CM | POA: Insufficient documentation

## 2017-07-31 DIAGNOSIS — E785 Hyperlipidemia, unspecified: Secondary | ICD-10-CM | POA: Insufficient documentation

## 2017-07-31 DIAGNOSIS — K5900 Constipation, unspecified: Secondary | ICD-10-CM | POA: Diagnosis not present

## 2017-07-31 DIAGNOSIS — M4802 Spinal stenosis, cervical region: Secondary | ICD-10-CM | POA: Insufficient documentation

## 2017-07-31 DIAGNOSIS — I110 Hypertensive heart disease with heart failure: Secondary | ICD-10-CM | POA: Insufficient documentation

## 2017-07-31 DIAGNOSIS — M47818 Spondylosis without myelopathy or radiculopathy, sacral and sacrococcygeal region: Secondary | ICD-10-CM | POA: Insufficient documentation

## 2017-07-31 DIAGNOSIS — G894 Chronic pain syndrome: Secondary | ICD-10-CM

## 2017-07-31 DIAGNOSIS — M47816 Spondylosis without myelopathy or radiculopathy, lumbar region: Secondary | ICD-10-CM

## 2017-07-31 DIAGNOSIS — M461 Sacroiliitis, not elsewhere classified: Secondary | ICD-10-CM

## 2017-07-31 DIAGNOSIS — M5136 Other intervertebral disc degeneration, lumbar region: Secondary | ICD-10-CM | POA: Diagnosis not present

## 2017-07-31 DIAGNOSIS — M5135 Other intervertebral disc degeneration, thoracolumbar region: Secondary | ICD-10-CM | POA: Diagnosis not present

## 2017-07-31 DIAGNOSIS — M51369 Other intervertebral disc degeneration, lumbar region without mention of lumbar back pain or lower extremity pain: Secondary | ICD-10-CM

## 2017-07-31 DIAGNOSIS — I447 Left bundle-branch block, unspecified: Secondary | ICD-10-CM | POA: Diagnosis not present

## 2017-07-31 DIAGNOSIS — M4805 Spinal stenosis, thoracolumbar region: Secondary | ICD-10-CM | POA: Diagnosis not present

## 2017-07-31 DIAGNOSIS — Z79899 Other long term (current) drug therapy: Secondary | ICD-10-CM | POA: Insufficient documentation

## 2017-07-31 DIAGNOSIS — Z8551 Personal history of malignant neoplasm of bladder: Secondary | ICD-10-CM | POA: Diagnosis not present

## 2017-07-31 DIAGNOSIS — Z8711 Personal history of peptic ulcer disease: Secondary | ICD-10-CM | POA: Diagnosis not present

## 2017-07-31 DIAGNOSIS — F17210 Nicotine dependence, cigarettes, uncomplicated: Secondary | ICD-10-CM | POA: Insufficient documentation

## 2017-07-31 DIAGNOSIS — Z9842 Cataract extraction status, left eye: Secondary | ICD-10-CM | POA: Insufficient documentation

## 2017-07-31 DIAGNOSIS — Z9841 Cataract extraction status, right eye: Secondary | ICD-10-CM | POA: Diagnosis not present

## 2017-07-31 DIAGNOSIS — M25551 Pain in right hip: Secondary | ICD-10-CM | POA: Insufficient documentation

## 2017-07-31 DIAGNOSIS — I420 Dilated cardiomyopathy: Secondary | ICD-10-CM | POA: Insufficient documentation

## 2017-07-31 DIAGNOSIS — M25552 Pain in left hip: Secondary | ICD-10-CM | POA: Insufficient documentation

## 2017-07-31 DIAGNOSIS — R269 Unspecified abnormalities of gait and mobility: Secondary | ICD-10-CM | POA: Insufficient documentation

## 2017-07-31 DIAGNOSIS — I5022 Chronic systolic (congestive) heart failure: Secondary | ICD-10-CM | POA: Insufficient documentation

## 2017-07-31 DIAGNOSIS — Z760 Encounter for issue of repeat prescription: Secondary | ICD-10-CM | POA: Insufficient documentation

## 2017-07-31 DIAGNOSIS — J439 Emphysema, unspecified: Secondary | ICD-10-CM | POA: Diagnosis not present

## 2017-07-31 MED ORDER — HYDROCODONE BITARTRATE ER 10 MG PO CP12: 10.0000 mg | ORAL_CAPSULE | Freq: Two times a day (BID) | ORAL | 0 refills | Status: DC

## 2017-07-31 MED ORDER — HYDROCODONE-ACETAMINOPHEN 10-325 MG PO TABS: 1.0000 | ORAL_TABLET | ORAL | 0 refills | Status: DC | PRN

## 2017-07-31 NOTE — Progress Notes (Signed)
Nursing Pain Medication Assessment:  Safety precautions to be maintained throughout the outpatient stay will include: orient to surroundings, keep bed in low position, maintain call bell within reach at all times, provide assistance with transfer out of bed and ambulation.  Medication Inspection Compliance: Pill count conducted under aseptic conditions, in front of the patient. Neither the pills nor the bottle was removed from the patient's sight at any time. Once count was completed pills were immediately returned to the patient in their original bottle.  Medication: Oxycodone IR Pill/Patch Count: 10 of 120 pills remain Pill/Patch Appearance: Markings consistent with prescribed medication Bottle Appearance: Standard pharmacy container. Clearly labeled. Filled Date: 4 / 23 / 2019 Last Medication intake:  Today

## 2017-07-31 NOTE — Patient Instructions (Addendum)
1. New medication: Hydrocodone ER, take 10 mg twice daily after meal 2. We will go back to your previous Hydrocodone (IR) that you can take every 4 hours AS NEEDED for severe, breakthrough pain  You were given a script for Hydrocodone 5/325 mg and a script for Hydrocodone ER 10 mg

## 2017-07-31 NOTE — Progress Notes (Signed)
Patient's Name: [name]  MRN: [id number]  Referring Provider: [name], MD  DOB: [birth year]  PCP: [name], MD  DOS: 07/31/2017  Note by: [name], MD  Service setting: Ambulatory outpatient  Specialty: Interventional Pain Management  Location: [facility] (AMB) Pain Management Facility    Patient type: Established   Primary Reason(s) for Visit: Encounter for prescription drug management. (Level of risk: moderate)  CC: Back Pain (lower) and Hip Pain (bilaterally, left side is worse)  HPI  [name] is a 77 y.o. year old, female patient, who comes today for a medication management evaluation. She has Palpitations; Hyponatremia; Bladder cancer ([place]); LBBB (left bundle branch block); Atrial ectopy; Congestive dilated cardiomyopathy ([place]); History of gastric ulcer; Spinal stenosis of thoracolumbar region; COPD (chronic obstructive pulmonary disease) ([place]); Constipation; Spinal stenosis in cervical region; Chronic bilateral low back pain; NSAID induced gastritis; Parkinsonian features; Hypertension; Spinal stenosis of lumbar region without neurogenic claudication; Other idiopathic scoliosis, lumbar region; Hyperlipidemia; S/P placement of cardiac pacemaker; Coronary artery disease; Lumbar spondylosis; SI joint arthritis; and Lumbar degenerative disc disease on their problem list. Her primarily concern today is the Back Pain (lower) and Hip Pain (bilaterally, left side is worse)  Pain Assessment: Location: Lower Back Radiating: sometimes down the back of left leg to knee Onset: More than a month ago Duration: Chronic pain Quality: Aching, Constant Severity: 7 /10 (subjective, self-reported pain score)  Note: Reported level is inconsistent with clinical observations.                         When using our objective Pain Scale, levels between 6 and 10/10 are said to belong in an emergency room, as it progressively worsens from a 6/10, described as severely limiting, requiring emergency care  not usually available at an outpatient pain management facility. At a 6/10 level, communication becomes difficult and requires great effort. Assistance to reach the emergency department may be required. Facial flushing and profuse sweating along with potentially dangerous increases in heart rate and blood pressure will be evident. Effect on ADL: pain keeps pt from doing as much as she's like to do Timing: Constant Modifying factors: medications "they don't help as much as they did"; lying down BP: (!) 148/91  HR: 71  [name] was last scheduled for an appointment on 07/11/2017 for medication management. During today's appointment we reviewed [name] chronic pain status, as well as her outpatient medication regimen.  Patient returns today for medication management.  At her last visit hydrocodone was changed to oxycodone 10 mg 4 times a day as needed for severe pain.  Patient is not finding any significant benefit with the change from hydrocodone to oxycodone and in fact states that hydrocodone was better in regards to her pain control.  She is stating that the hydrocodone only lasts for 2 to 3 hours and that she has to re-dose every 4 hours and sometimes earlier.  We discussed adding a long-acting, extended release low-dose opioid so that she can utilize less immediate release for breakthrough pain.  The patient  reports that she does not use drugs. Her body mass index is 19.01 kg/m.  Further details on both, my assessment(s), as well as the proposed treatment plan, please see below.  Controlled Substance Pharmacotherapy Assessment REMS (Risk Evaluation and Mitigation Strategy)  Analgesic: Hydrocodone 10 mg every 4 hours as needed (60 MME), new addition of extended release hydrocodone 10 mg twice daily (20 MME) MME/day:  80 mg/day.  [name], RN  07/31/2017 12:30 PM  Sign at close encounter Nursing Pain Medication Assessment:  Safety precautions to be maintained throughout the outpatient  stay will include: orient to surroundings, keep bed in low position, maintain call bell within reach at all times, provide assistance with transfer out of bed and ambulation.  Medication Inspection Compliance: Pill count conducted under aseptic conditions, in front of the patient. Neither the pills nor the bottle was removed from the patient's sight at any time. Once count was completed pills were immediately returned to the patient in their original bottle.  Medication: Oxycodone IR Pill/Patch Count: 10 of 120 pills remain Pill/Patch Appearance: Markings consistent with prescribed medication Bottle Appearance: Standard pharmacy container. Clearly labeled. Filled Date: 4 / 23 / 2019 Last Medication intake:  Today   Pharmacokinetics: Liberation and absorption (onset of action): WNL Distribution (time to peak effect): WNL Metabolism and excretion (duration of action): WNL         Pharmacodynamics: Desired effects: Analgesia: [name] reports 50% benefit. Functional ability: Patient reports that medication does help, but not nearly as much as she would like Clinically meaningful improvement in function (CMIF): Sustained CMIF goals met Perceived effectiveness: Described as ineffective and would like to make some changes Undesirable effects: Side-effects or Adverse reactions: None reported Monitoring: [place] PMP: Online review of the past 28-monthperiod conducted. Compliant with practice rules and regulations Last UDS on record: Summary  Date Value Ref Range Status  12/27/2016 FINAL  Final    Comment:    ==================================================================== TOXASSURE COMP DRUG ANALYSIS,UR ==================================================================== Test                             Result       Flag       Units Drug Present and Declared for Prescription Verification   Hydrocodone                    3156         EXPECTED   ng/mg creat   Hydromorphone                   848          EXPECTED   ng/mg creat   Dihydrocodeine                 236          EXPECTED   ng/mg creat   Norhydrocodone                 3780         EXPECTED   ng/mg creat    Sources of hydrocodone include scheduled prescription    medications. Hydromorphone, dihydrocodeine and norhydrocodone are    expected metabolites of hydrocodone. Hydromorphone and    dihydrocodeine are also available as scheduled prescription    medications.   Gabapentin                     PRESENT      EXPECTED   Cyclobenzaprine                PRESENT      EXPECTED   Desmethylcyclobenzaprine       PRESENT      EXPECTED    Desmethylcyclobenzaprine is an expected metabolite of    cyclobenzaprine.   Acetaminophen  PRESENT      EXPECTED   Metoprolol                     PRESENT      EXPECTED Drug Absent but Declared for Prescription Verification   Duloxetine                     Not Detected UNEXPECTED   Promethazine                   Not Detected UNEXPECTED ==================================================================== Test                      Result    Flag   Units      Ref Range   Creatinine              25               mg/dL      >=20 ==================================================================== Declared Medications:  The flagging and interpretation on this report are based on the  following declared medications.  Unexpected results may arise from  inaccuracies in the declared medications.  **Note: The testing scope of this panel includes these medications:  Cyclobenzaprine (Flexeril)  Duloxetine (Cymbalta)  Gabapentin (Neurontin)  Hydrocodone (Norco)  Metoprolol (Toprol)  Promethazine (Phenergan)  **Note: The testing scope of this panel does not include small to  moderate amounts of these reported medications:  Acetaminophen (Norco)  **Note: The testing scope of this panel does not include following  reported medications:  Amlodipine (Norvasc)  Lisinopril (Prinivil)   Pantoprazole (Protonix)  Polyethylene Glycol (MiraLAX)  Spironolactone (Aldactone)  Sucralfate (Carafate) ==================================================================== For clinical consultation, please call [phone]. ====================================================================    UDS interpretation: Compliant          Medication Assessment Form: Reviewed. Patient indicates being compliant with therapy Treatment compliance: Compliant Risk Assessment Profile: Aberrant behavior: See prior evaluations. None observed or detected today Comorbid factors increasing risk of overdose: See prior notes. No additional risks detected today Risk of substance use disorder (SUD): Low Opioid Risk Tool - 07/31/17 1227      Family History of Substance Abuse   Alcohol  Negative    Illegal Drugs  Negative    Rx Drugs  Negative      Personal History of Substance Abuse   Alcohol  Negative    Illegal Drugs  Negative    Rx Drugs  Negative      Age   Age between 60-45 years   No      History of Preadolescent Sexual Abuse   History of Preadolescent Sexual Abuse  Negative or Female      Psychological Disease   Psychological Disease  Negative    Depression  Positive      Total Score   Opioid Risk Tool Scoring  1    Opioid Risk Interpretation  Low Risk      ORT Scoring interpretation table:  Score <3 = Low Risk for SUD  Score between 4-7 = Moderate Risk for SUD  Score >8 = High Risk for Opioid Abuse   Risk Mitigation Strategies:  Patient Counseling: Covered Patient-Prescriber Agreement (PPA): Present and active  Notification to other healthcare providers: Done  Pharmacologic Plan: Discontinue oxycodone.  We will go back to short acting hydrocodone 10 mg every 4 hours as needed.  Will add long-acting hydrocodone 10 mg twice daily  Laboratory Chemistry  Inflammation Markers (CRP: Acute Phase) (ESR: Chronic Phase) No results found for: CRP, ESRSEDRATE,  LATICACIDVEN                       Rheumatology Markers No results found for: Elayne Guerin, [facility]                      Renal Function Markers Lab Results  Component Value Date   BUN 11 03/24/2017   CREATININE 0.73 03/24/2017   GFRAA >60 10/02/2015   GFRNONAA >60 10/02/2015                              Hepatic Function Markers Lab Results  Component Value Date   AST 25 03/24/2017   ALT 17 03/24/2017   ALBUMIN 4.4 03/24/2017   ALKPHOS 105 03/24/2017   LIPASE 30 11/23/2013                        Electrolytes Lab Results  Component Value Date   NA 136 06/30/2017   K 4.2 06/30/2017   CL 99 03/24/2017   CALCIUM 9.9 03/24/2017                        Neuropathy Markers Lab Results  Component Value Date   VITAMINB12 1,069 (H) 12/27/2013   HGBA1C 4.6 12/27/2013                        Bone Pathology Markers No results found for: VD25OH, [id number], [id number], [id number], 25OHVITD1, 25OHVITD2, 25OHVITD3, TESTOFREE, TESTOSTERONE                       Coagulation Parameters Lab Results  Component Value Date   INR 1.0 04/28/2014   LABPROT 10.3 04/28/2014   APTT 27 11/22/2013   PLT 209 10/02/2015                        Cardiovascular Markers Lab Results  Component Value Date   CKTOTAL 87 09/01/2010   CKMB 4.1 (H) 09/01/2010   TROPONINI <0.30 11/23/2013   HGB 13.3 06/30/2017   HCT 39.0 06/30/2017                         CA Markers No results found for: CEA, CA125, LABCA2                      Note: Lab results reviewed.  Recent Diagnostic Imaging Results  DG C-Arm 1-60 Min-No Report Fluoroscopy was utilized by the requesting physician.  No radiographic  interpretation.   Complexity Note: Imaging results reviewed. Results shared with Ms. Martenson, using Layman's terms.                         Meds   Current Outpatient Medications:  .  amLODipine (NORVASC) 2.5 MG tablet, Take 2.5 mg by mouth daily., Disp: , Rfl:  .   cyclobenzaprine (FLEXERIL) 5 MG tablet, TAKE 1 TABLET(5 MG) BY MOUTH THREE TIMES DAILY AS NEEDED FOR MUSCLE SPASMS, Disp: 90 tablet, Rfl: 0 .  DULoxetine (CYMBALTA) 30 MG capsule, TAKE 1 CAPSULE(30 MG) BY MOUTH DAILY, Disp: 30 capsule, Rfl: 0 .  gabapentin (NEURONTIN) 300 MG  capsule, Take 1 capsule (300 mg total) by mouth 2 (two) times daily. (Patient taking differently: Take 300 mg by mouth daily. ), Disp: 60 capsule, Rfl: 2 .  lisinopril (PRINIVIL,ZESTRIL) 40 MG tablet, TAKE 1 TABLET(40 MG) BY MOUTH DAILY (Patient taking differently: TAKE 1 TABLET(40 MG) BY MOUTH DAILY--- takes in am), Disp: 90 tablet, Rfl: 1 .  metoprolol succinate (TOPROL-XL) 25 MG 24 hr tablet, Take 1 tablet (25 mg total) by mouth daily. (Patient taking differently: Take 25 mg by mouth every morning. ), Disp: 30 tablet, Rfl: 10 .  Oxycodone HCl 10 MG TABS, Take 1 tablet (10 mg total) by mouth every 6 (six) hours as needed. For chronic pain To last for 30 days from fill date, Disp: 120 tablet, Rfl: 0 .  pantoprazole (PROTONIX) 40 MG tablet, TAKE 1 TABLET(40 MG) BY MOUTH TWICE DAILY (Patient taking differently: Take 40 mg by mouth 2 (two) times daily. TAKE 1 TABLET(40 MG) BY MOUTH TWICE DAILY), Disp: 60 tablet, Rfl: 2 .  polyethylene glycol (MIRALAX / GLYCOLAX) packet, Take 17 g by mouth daily as needed for mild constipation. , Disp: , Rfl:  .  promethazine (PHENERGAN) 12.5 MG tablet, TAKE 1 TABLET BY MOUTH EVERY 8 HOURS AS NEEDED FOR NAUSEA OR VOMITING, Disp: 60 tablet, Rfl: 0 .  spironolactone (ALDACTONE) 25 MG tablet, TAKE 1 TABLET BY MOUTH DAILY (Patient taking differently: TAKE 1 TABLET BY MOUTH DAILY--- per pt only takes when she has swelling), Disp: 30 tablet, Rfl: 5 .  sucralfate (CARAFATE) 1 g tablet, TAKE 1 TABLET BY MOUTH FOUR TIMES DAILY EVERY NIGHT AT BEDTIME WITH MEALS, Disp: 120 tablet, Rfl: 1 .  HYDROcodone Bitartrate ER 10 MG CP12, Take 10 mg by mouth every 12 (twelve) hours., Disp: 60 capsule, Rfl: 0 .   HYDROcodone-acetaminophen (NORCO) 10-325 MG tablet, Take 1 tablet by mouth every 4 (four) hours as needed for severe pain. For severe breakthrough pain. Take only as needed Rx to last for 30 days, Disp: 180 tablet, Rfl: 0 No current facility-administered medications for this visit.   Facility-Administered Medications Ordered in Other Visits:  .  epirubicin (ELLENCE) 50 mg in sodium chloride 0.9 % bladder instillation, 50 mg, Bladder Instillation, Once, [name], MD  ROS  Constitutional: Denies any fever or chills Gastrointestinal: No reported hemesis, hematochezia, vomiting, or acute GI distress Musculoskeletal: Denies any acute onset joint swelling, redness, loss of ROM, or weakness Neurological: No reported episodes of acute onset apraxia, aphasia, dysarthria, agnosia, amnesia, paralysis, loss of coordination, or loss of consciousness  Allergies  Ms. Pha has No Known Allergies.  [place]  Drug: Ms. Scullin  reports that she does not use drugs. Alcohol:  reports that she does not drink alcohol. Tobacco:  reports that she has been smoking cigarettes.  She has a 27.50 pack-year smoking history. She has never used smokeless tobacco. Medical:  has a past medical history of AICD (automatic cardioverter/defibrillator) present (placement 05/07/2014 medtronic    ), Arthritis, Bladder cancer ([place]) (urologist-  dr Junious Silk), Chronic hyponatremia, Chronic lower back pain, Chronic systolic (congestive) heart failure ([place]), COPD with emphysema ([place]), Coronary artery disease (cardiologist-  dr Kathlyn [place]), DDD (degenerative disc disease), thoracolumbar, Ectopic atrial beats, Frequent urination, Full dentures, Gait instability, GERD (gastroesophageal reflux disease), History of iron deficiency anemia (11/2013), History of stomach ulcers (11/2013), Hypertension, LBBB (left bundle branch block), NICM (nonischemic cardiomyopathy) ([place]) (last echo 12-31-2015 ef 55-60%), Nocturia more than twice per night, and  Scoliosis. Surgical: Ms. Kable  has a  past surgical history that includes Esophagogastroduodenoscopy (N/A, 11/28/2013); Transurethral resection of bladder tumor (N/A, 12/13/2013); Transurethral resection of bladder tumor (N/A, 01/17/2014); Cataract extraction w/ intraocular lens  implant, bilateral (Bilateral, right 12-2013 / left  02-2014); Cystoscopy with retrograde pyelogram, ureteroscopy and stent placement (Bilateral, 04/18/2014); Transurethral resection of bladder tumor (N/A, 04/18/2014); Lumbar disc surgery (1980's); bi-ventricular implantable cardioverter defibrillator (N/A, 05/07/2014); Cystoscopy w/ retrogrades (Bilateral, 01/06/2015); Cystoscopy with biopsy (N/A, 10/06/2015); Cardiac catheterization (03-03-2014  dr Kathlyn [place]   [facility] Transurethral resection of bladder (1995); Cardiovascular stress test (11/25/2013); transthoracic echocardiogram (12/31/2015   dr Caryl Comes); and Cystoscopy with fulgeration (N/A, 06/30/2017). Family: family history includes Cancer in her brother and mother.  Constitutional Exam  General appearance: Well nourished, well developed, and well hydrated. In no apparent acute distress Vitals:   07/31/17 1209  BP: (!) 148/91  Pulse: 71  Resp: 16  Temp: 98.1 F (36.7 C)  TempSrc: Oral  SpO2: 100%  Weight: 125 lb (56.7 kg)  Height: 5' 8"  (1.727 m)   BMI Assessment: Estimated body mass index is 19.01 kg/m as calculated from the following:   Height as of this encounter: 5' 8"  (1.727 m).   Weight as of this encounter: 125 lb (56.7 kg).  BMI interpretation table: BMI level Category Range association with higher incidence of chronic pain  <18 kg/m2 Underweight   18.5-24.9 kg/m2 Ideal body weight   25-29.9 kg/m2 Overweight Increased incidence by 20%  30-34.9 kg/m2 Obese (Class I) Increased incidence by 68%  35-39.9 kg/m2 Severe obesity (Class II) Increased incidence by 136%  >40 kg/m2 Extreme obesity (Class III) Increased incidence by 254%   Patient's current BMI  Ideal Body weight  Body mass index is 19.01 kg/m. Ideal body weight: 63.9 kg (140 lb 14 oz)   BMI Readings from Last 4 Encounters:  07/31/17 19.01 kg/m  07/11/17 19.58 kg/m  06/30/17 18.69 kg/m  06/09/17 19.20 kg/m   Wt Readings from Last 4 Encounters:  07/31/17 125 lb (56.7 kg)  07/11/17 125 lb (56.7 kg)  06/30/17 121 lb 1.6 oz (54.9 kg)  06/09/17 124 lb 6.4 oz (56.4 kg)  Psych/Mental status: Alert, oriented x 3 (person, place, & time)       Eyes: PERLA Respiratory: No evidence of acute respiratory distress  Cervical Spine Area Exam  Skin & Axial Inspection: No masses, redness, edema, swelling, or associated skin lesions Alignment: Symmetrical Functional ROM: Unrestricted ROM      Stability: No instability detected Muscle Tone/Strength: Functionally intact. No obvious neuro-muscular anomalies detected. Sensory (Neurological): Unimpaired Palpation: No palpable anomalies              Upper Extremity (UE) Exam    Side: Right upper extremity  Side: Left upper extremity  Skin & Extremity Inspection: Skin color, temperature, and hair growth are WNL. No peripheral edema or cyanosis. No masses, redness, swelling, asymmetry, or associated skin lesions. No contractures.  Skin & Extremity Inspection: Skin color, temperature, and hair growth are WNL. No peripheral edema or cyanosis. No masses, redness, swelling, asymmetry, or associated skin lesions. No contractures.  Functional ROM: Unrestricted ROM          Functional ROM: Unrestricted ROM          Muscle Tone/Strength: Functionally intact. No obvious neuro-muscular anomalies detected.  Muscle Tone/Strength: Functionally intact. No obvious neuro-muscular anomalies detected.  Sensory (Neurological): Unimpaired          Sensory (Neurological): Unimpaired          Palpation: No palpable  anomalies              Palpation: No palpable anomalies              Specialized Test(s): Deferred         Specialized Test(s): Deferred           Thoracic Spine Area Exam  Skin & Axial Inspection: No masses, redness, or swelling Alignment: Symmetrical Functional ROM: Unrestricted ROM Stability: No instability detected Muscle Tone/Strength: Functionally intact. No obvious neuro-muscular anomalies detected. Sensory (Neurological): Unimpaired Muscle strength & Tone: No palpable anomalies  Lumbar Spine Area Exam  Skin & Axial Inspection: No masses, redness, or swelling Alignment: Symmetrical Functional ROM: Decreased ROM       Stability: No instability detected Muscle Tone/Strength: Functionally intact. No obvious neuro-muscular anomalies detected. Sensory (Neurological): Musculoskeletal pain pattern Palpation: Complains of area being tender to palpation       Provocative Tests: Lumbar Hyperextension and rotation test: Positive bilaterally for facet joint pain. Lumbar Lateral bending test: Positive due to pain. Patrick's Maneuver: evaluation deferred today                    Gait & Posture Assessment  Ambulation: Patient ambulates using a walker Gait: Limited. Using assistive device to ambulate Posture: Thoracic kyphosis   Lower Extremity Exam    Side: Right lower extremity  Side: Left lower extremity  Stability: No instability observed          Stability: No instability observed          Skin & Extremity Inspection: Skin color, temperature, and hair growth are WNL. No peripheral edema or cyanosis. No masses, redness, swelling, asymmetry, or associated skin lesions. No contractures.  Skin & Extremity Inspection: Skin color, temperature, and hair growth are WNL. No peripheral edema or cyanosis. No masses, redness, swelling, asymmetry, or associated skin lesions. No contractures.  Functional ROM: Unrestricted ROM                  Functional ROM: Unrestricted ROM                  Muscle Tone/Strength: Functionally intact. No obvious neuro-muscular anomalies detected.  Muscle Tone/Strength: Functionally intact. No obvious  neuro-muscular anomalies detected.  Sensory (Neurological): Unimpaired  Sensory (Neurological): Unimpaired  Palpation: No palpable anomalies  Palpation: No palpable anomalies   Assessment  Primary Diagnosis & Pertinent Problem List: The primary encounter diagnosis was Lumbar spondylosis. Diagnoses of SI joint arthritis, Lumbar degenerative disc disease, and Chronic pain syndrome were also pertinent to this visit.  Status Diagnosis  Controlled Controlled Controlled 1. Lumbar spondylosis   2. SI joint arthritis   3. Lumbar degenerative disc disease   4. Chronic pain syndrome      General Recommendations: The pain condition that the patient suffers from is best treated with a multidisciplinary approach that involves an increase in physical activity to prevent de-conditioning and worsening of the pain cycle, as well as psychological counseling (formal and/or informal) to address the co-morbid psychological affects of pain. Treatment will often involve judicious use of pain medications and interventional procedures to decrease the pain, allowing the patient to participate in the physical activity that will ultimately produce long-lasting pain reductions. The goal of the multidisciplinary approach is to return the patient to a higher level of overall function and to restore their ability to perform activities of daily living.  77 year old female with a history of axial low back pain secondary to lumbar spondylosis,  degenerative disc disease, SI joint arthritis.  Patient presents today for medication refill. At her last visit hydrocodone was changed to oxycodone 10 mg 4 times a day as needed for severe pain.  Patient is not finding any significant benefit with the change from hydrocodone to oxycodone and in fact states that hydrocodone was better in regards to her pain control.  She is stating that the hydrocodone only lasts for 2 to 3 hours and that she has to re-dose every 4 hours and sometimes  earlier.  We discussed adding a long-acting, extended release low-dose opioid so that she can utilize less immediate release for breakthrough pain.  Plan: -Stop Oxycodone -Revert back to Hydrocodone IR 10 mg Q4 hrs PRN severe, breakthrough pain (#180/month) MME=60 -Add Hydrocodone ER 10 mg BID for long acting pain control -Continue Flexeril and Gabapentin as previously prescribed -Follow up 1 month  Plan of Care  Pharmacotherapy (Medications Ordered): Meds ordered this encounter  Medications  . HYDROcodone Bitartrate ER 10 MG CP12    Sig: Take 10 mg by mouth every 12 (twelve) hours.    Dispense:  60 capsule    Refill:  0    Do not place this medication, or any other prescription from our practice, on "Automatic Refill". Patient may have prescription filled one day early if pharmacy is closed on scheduled refill date.  For chronic pain To last for 30 days from fill date  . HYDROcodone-acetaminophen (NORCO) 10-325 MG tablet    Sig: Take 1 tablet by mouth every 4 (four) hours as needed for severe pain. For severe breakthrough pain. Take only as needed Rx to last for 30 days    Dispense:  180 tablet    Refill:  0    Do not place this medication, or any other prescription from our practice, on "Automatic Refill". Patient may have prescription filled one day early if pharmacy is closed on scheduled refill date.   Time Note: Greater than 50% of the 25 minute(s) of face-to-face time spent with Ms. Whittingham, was spent in counseling/coordination of care regarding: the appropriate use of the pain scale, opioid tolerance, "Drug Holidays", Ms. Crutcher primary cause of pain, medication side effects, the opioid analgesic risks and possible complications, the appropriate use of her medications, the goals of pain management (increased in functionality), the medication agreement and the patient's responsibilities when it comes to controlled substances.  Provider-requested follow-up: Return in about 1 month  (around 08/29/2017) for Medication Management.  Future Appointments  Date Time Provider [place]  08/29/2017 11:15 AM [facility] DEVICE REMOTES [facility]  08/31/2017 12:15 PM [name], MD [facility] None  06/13/2018  3:00 PM O'Brien-Blaney, Bryson Corona, LPN [facility]    Primary Care Physician: [name], MD Location: [facility] Note by: [name], M.D Date: 07/31/2017; Time: 1:46 PM  Patient Instructions  1. New medication: Hydrocodone ER, take 10 mg twice daily after meal 2. We will go back to your previous Hydrocodone (IR) that you can take every 4 hours AS NEEDED for severe, breakthrough pain  You were given a script for Hydrocodone 5/325 mg and a script for Hydrocodone ER 10 mg

## 2017-08-22 ENCOUNTER — Telehealth: Payer: Self-pay | Admitting: Internal Medicine

## 2017-08-22 DIAGNOSIS — M48061 Spinal stenosis, lumbar region without neurogenic claudication: Secondary | ICD-10-CM

## 2017-08-22 NOTE — Telephone Encounter (Signed)
Please advise 

## 2017-08-22 NOTE — Telephone Encounter (Signed)
I am not taking on any more chronic pain management patients.  She needs to stick with the Pain Clinic

## 2017-08-22 NOTE — Telephone Encounter (Signed)
Pt states that she would like a consult with Dr. Derrel Nip in regards to her pain. Pt has stated that the pain clinic is not helping her pain very much. Your next available appt isn't until 09/27/2017. Pt stated that she is in so much pain that she can not wait that long. Would it be ok to schedule pt in one of the same day urgent slots.

## 2017-08-22 NOTE — Telephone Encounter (Signed)
Copied from [place] [id number]. Topic: General - Other >> Aug 22, 2017 10:39 AM Cecelia Byars, NT wrote: Reason for CRM: Patient needs  a consult about the pain in her back and buttocks and what her options are , she says the pain clinic is not helping much she says the pain is getting worse   due to chronic pain please call her to schedule an appointment .Their is nothing available before July 10 ,2019 she is in too much pain to wait until then and please call her at [phone], she would like a return call from Dr Demetrios Isaacs nurse not a nurse from the [facility]  thanks

## 2017-08-23 NOTE — Telephone Encounter (Signed)
Spoke with pt and she would like to see if you would refer her to an orthopedic that you recommend to see what they could offer her. She stated that the pain clinic just is not helping with her pain.

## 2017-08-23 NOTE — Telephone Encounter (Signed)
Referral to Dr Sharlet Salina at [facility] is in process

## 2017-08-23 NOTE — Telephone Encounter (Signed)
LMTCB. [facility] may speak with pt.  

## 2017-08-24 NOTE — Telephone Encounter (Signed)
LMTCB. [facility] may speak with pt.  

## 2017-08-29 ENCOUNTER — Ambulatory Visit (INDEPENDENT_AMBULATORY_CARE_PROVIDER_SITE_OTHER): Payer: Medicare Other | Admitting: *Deleted

## 2017-08-29 DIAGNOSIS — I428 Other cardiomyopathies: Secondary | ICD-10-CM | POA: Diagnosis not present

## 2017-08-29 NOTE — Progress Notes (Signed)
Remote ICD transmission.   

## 2017-08-30 ENCOUNTER — Encounter: Payer: Self-pay | Admitting: Cardiology

## 2017-08-31 ENCOUNTER — Other Ambulatory Visit: Payer: Self-pay

## 2017-08-31 ENCOUNTER — Encounter: Payer: Self-pay | Admitting: Student in an Organized Health Care Education/Training Program

## 2017-08-31 ENCOUNTER — Ambulatory Visit
Payer: Medicare Other | Attending: Student in an Organized Health Care Education/Training Program | Admitting: Student in an Organized Health Care Education/Training Program

## 2017-08-31 VITALS — BP 157/77 | HR 70 | Temp 98.6°F | Resp 18 | Ht 68.0 in | Wt 125.0 lb

## 2017-08-31 DIAGNOSIS — J449 Chronic obstructive pulmonary disease, unspecified: Secondary | ICD-10-CM | POA: Diagnosis not present

## 2017-08-31 DIAGNOSIS — Z95810 Presence of automatic (implantable) cardiac defibrillator: Secondary | ICD-10-CM | POA: Diagnosis not present

## 2017-08-31 DIAGNOSIS — Z8719 Personal history of other diseases of the digestive system: Secondary | ICD-10-CM | POA: Diagnosis not present

## 2017-08-31 DIAGNOSIS — C679 Malignant neoplasm of bladder, unspecified: Secondary | ICD-10-CM | POA: Insufficient documentation

## 2017-08-31 DIAGNOSIS — I447 Left bundle-branch block, unspecified: Secondary | ICD-10-CM | POA: Insufficient documentation

## 2017-08-31 DIAGNOSIS — I110 Hypertensive heart disease with heart failure: Secondary | ICD-10-CM | POA: Diagnosis not present

## 2017-08-31 DIAGNOSIS — M48062 Spinal stenosis, lumbar region with neurogenic claudication: Secondary | ICD-10-CM | POA: Diagnosis not present

## 2017-08-31 DIAGNOSIS — I5022 Chronic systolic (congestive) heart failure: Secondary | ICD-10-CM | POA: Diagnosis not present

## 2017-08-31 DIAGNOSIS — M4805 Spinal stenosis, thoracolumbar region: Secondary | ICD-10-CM | POA: Diagnosis not present

## 2017-08-31 DIAGNOSIS — M47816 Spondylosis without myelopathy or radiculopathy, lumbar region: Secondary | ICD-10-CM

## 2017-08-31 DIAGNOSIS — Z79899 Other long term (current) drug therapy: Secondary | ICD-10-CM | POA: Diagnosis not present

## 2017-08-31 DIAGNOSIS — M461 Sacroiliitis, not elsewhere classified: Secondary | ICD-10-CM

## 2017-08-31 DIAGNOSIS — I420 Dilated cardiomyopathy: Secondary | ICD-10-CM | POA: Diagnosis not present

## 2017-08-31 DIAGNOSIS — K2960 Other gastritis without bleeding: Secondary | ICD-10-CM | POA: Diagnosis not present

## 2017-08-31 DIAGNOSIS — M4802 Spinal stenosis, cervical region: Secondary | ICD-10-CM | POA: Diagnosis not present

## 2017-08-31 DIAGNOSIS — R002 Palpitations: Secondary | ICD-10-CM | POA: Diagnosis not present

## 2017-08-31 DIAGNOSIS — I2510 Atherosclerotic heart disease of native coronary artery without angina pectoris: Secondary | ICD-10-CM | POA: Insufficient documentation

## 2017-08-31 DIAGNOSIS — F17210 Nicotine dependence, cigarettes, uncomplicated: Secondary | ICD-10-CM | POA: Insufficient documentation

## 2017-08-31 DIAGNOSIS — M25551 Pain in right hip: Secondary | ICD-10-CM | POA: Diagnosis present

## 2017-08-31 DIAGNOSIS — M47818 Spondylosis without myelopathy or radiculopathy, sacral and sacrococcygeal region: Secondary | ICD-10-CM

## 2017-08-31 DIAGNOSIS — M4688 Other specified inflammatory spondylopathies, sacral and sacrococcygeal region: Secondary | ICD-10-CM | POA: Insufficient documentation

## 2017-08-31 DIAGNOSIS — M51369 Other intervertebral disc degeneration, lumbar region without mention of lumbar back pain or lower extremity pain: Secondary | ICD-10-CM

## 2017-08-31 DIAGNOSIS — M5136 Other intervertebral disc degeneration, lumbar region: Secondary | ICD-10-CM | POA: Insufficient documentation

## 2017-08-31 DIAGNOSIS — G894 Chronic pain syndrome: Secondary | ICD-10-CM

## 2017-08-31 DIAGNOSIS — E785 Hyperlipidemia, unspecified: Secondary | ICD-10-CM | POA: Insufficient documentation

## 2017-08-31 DIAGNOSIS — M25552 Pain in left hip: Secondary | ICD-10-CM | POA: Diagnosis present

## 2017-08-31 MED ORDER — METHYLPREDNISOLONE 4 MG PO TBPK: ORAL_TABLET | ORAL | 0 refills | Status: AC

## 2017-08-31 MED ORDER — HYDROCODONE-ACETAMINOPHEN 10-325 MG PO TABS: 1.0000 | ORAL_TABLET | ORAL | 0 refills | Status: DC | PRN

## 2017-08-31 NOTE — Patient Instructions (Signed)
You were given one prescription for Hydrocodone today. A prescription for Medrol Dosepak was sent to your pharmacy.

## 2017-08-31 NOTE — Progress Notes (Signed)
Patient's Name: [name]  MRN: [id number]  Referring Provider: [name], MD  DOB: [birth year]  PCP: [name], MD  DOS: 08/31/2017  Note by: [name], MD  Service setting: Ambulatory outpatient  Specialty: Interventional Pain Management  Location: [facility] (AMB) Pain Management Facility    Patient type: Established   Primary Reason(s) for Visit: Encounter for prescription drug management. (Level of risk: moderate)  CC: Hip Pain (both, worse in left) and Back Pain (lower)  HPI  [name] is a 77 y.o. year old, female patient, who comes today for a medication management evaluation. She has Palpitations; Hyponatremia; Bladder cancer ([place]); LBBB (left bundle branch block); Atrial ectopy; Congestive dilated cardiomyopathy ([place]); History of gastric ulcer; Spinal stenosis of thoracolumbar region; COPD (chronic obstructive pulmonary disease) ([place]); Constipation; Spinal stenosis in cervical region; Chronic bilateral low back pain; NSAID induced gastritis; Parkinsonian features; Hypertension; Spinal stenosis of lumbar region without neurogenic claudication; Other idiopathic scoliosis, lumbar region; Hyperlipidemia; S/P placement of cardiac pacemaker; Coronary artery disease; Lumbar spondylosis; SI joint arthritis; and Lumbar degenerative disc disease on their problem list. Her primarily concern today is the Hip Pain (both, worse in left) and Back Pain (lower)  Pain Assessment: Location: Lower Back Radiating: left upper leg Onset: More than a month ago Duration: Chronic pain Quality: Aching Severity: 8 /10 (subjective, self-reported pain score)  Note: Reported level is inconsistent with clinical observations.                         When using our objective Pain Scale, levels between 6 and 10/10 are said to belong in an emergency room, as it progressively worsens from a 6/10, described as severely limiting, requiring emergency care not usually available at an outpatient pain management facility.  At a 6/10 level, communication becomes difficult and requires great effort. Assistance to reach the emergency department may be required. Facial flushing and profuse sweating along with potentially dangerous increases in heart rate and blood pressure will be evident. Effect on ADL:   Timing: Constant Modifying factors: medications BP: (!) 157/77  HR: 70  [name] was last scheduled for an appointment on 07/31/2017 for medication management. During today's appointment we reviewed [name] chronic pain status, as well as her outpatient medication regimen.  Patient presents today for medication management.  Her last visit, she was started on a long-acting hydrocodone 10 mg twice daily.  She does not find this medication effective.  She is frustrated with her lack of pain relief.  Given that the patient does not find any significant benefit with extended release hydrocodone, I recommended that she discontinue this medication.  We can continue her immediate release hydrocodone 10 mg every 4 hours as needed, quantity 70-month  The patient  reports that she does not use drugs. Her body mass index is 19.01 kg/m.  Further details on both, my assessment(s), as well as the proposed treatment plan, please see below.  Controlled Substance Pharmacotherapy Assessment REMS (Risk Evaluation and Mitigation Strategy)  Analgesic: Hydrocodone 10 mg every 4 hours as needed, quantity 180 MME/day: 61 mg/day.  WLandis Martins RN  08/31/2017 11:34 AM  Sign at close encounter Nursing Pain Medication Assessment:  Safety precautions to be maintained throughout the outpatient stay will include: orient to surroundings, keep bed in low position, maintain call bell within reach at all times, provide assistance with transfer out of bed and ambulation.  Medication Inspection Compliance: Pill count conducted under aseptic  conditions, in front of the patient. Neither the pills nor the bottle was removed from the patient's  sight at any time. Once count was completed pills were immediately returned to the patient in their original bottle.  Medication #1: Hydrocodone/APAP Pill/Patch Count: 6 of 120 pills remain Pill/Patch Appearance: Markings consistent with prescribed medication Bottle Appearance: Standard pharmacy container. Clearly labeled. Filled Date:05/13/ 2019 Last Medication intake:  Today  Medication #2: zohydro  Pill/Patch Count: 0 of 60 pills remain Pill/Patch Appearance: Markings consistent with prescribed medication Bottle Appearance: Standard pharmacy container. Clearly labeled. Filled Date: 05/13 / 2018 Last Medication intake:  Today   Pharmacokinetics: Liberation and absorption (onset of action): WNL Distribution (time to peak effect): WNL Metabolism and excretion (duration of action): WNL         Pharmacodynamics: Desired effects: Analgesia: [name] reports >50% benefit. Functional ability: Patient reports that medication allows her to accomplish basic ADLs Clinically meaningful improvement in function (CMIF): Sustained CMIF goals met Perceived effectiveness: Described as relatively effective, allowing for increase in activities of daily living (ADL) Undesirable effects: Side-effects or Adverse reactions: None reported Monitoring: [place] PMP: Online review of the past 65-monthperiod conducted. Compliant with practice rules and regulations Last UDS on record: Summary  Date Value Ref Range Status  12/27/2016 FINAL  Final    Comment:    ==================================================================== TOXASSURE COMP DRUG ANALYSIS,UR ==================================================================== Test                             Result       Flag       Units Drug Present and Declared for Prescription Verification   Hydrocodone                    3156         EXPECTED   ng/mg creat   Hydromorphone                  848          EXPECTED   ng/mg creat   Dihydrocodeine                  236          EXPECTED   ng/mg creat   Norhydrocodone                 3780         EXPECTED   ng/mg creat    Sources of hydrocodone include scheduled prescription    medications. Hydromorphone, dihydrocodeine and norhydrocodone are    expected metabolites of hydrocodone. Hydromorphone and    dihydrocodeine are also available as scheduled prescription    medications.   Gabapentin                     PRESENT      EXPECTED   Cyclobenzaprine                PRESENT      EXPECTED   Desmethylcyclobenzaprine       PRESENT      EXPECTED    Desmethylcyclobenzaprine is an expected metabolite of    cyclobenzaprine.   Acetaminophen                  PRESENT      EXPECTED   Metoprolol  PRESENT      EXPECTED Drug Absent but Declared for Prescription Verification   Duloxetine                     Not Detected UNEXPECTED   Promethazine                   Not Detected UNEXPECTED ==================================================================== Test                      Result    Flag   Units      Ref Range   Creatinine              25               mg/dL      >=20 ==================================================================== Declared Medications:  The flagging and interpretation on this report are based on the  following declared medications.  Unexpected results may arise from  inaccuracies in the declared medications.  **Note: The testing scope of this panel includes these medications:  Cyclobenzaprine (Flexeril)  Duloxetine (Cymbalta)  Gabapentin (Neurontin)  Hydrocodone (Norco)  Metoprolol (Toprol)  Promethazine (Phenergan)  **Note: The testing scope of this panel does not include small to  moderate amounts of these reported medications:  Acetaminophen (Norco)  **Note: The testing scope of this panel does not include following  reported medications:  Amlodipine (Norvasc)  Lisinopril (Prinivil)  Pantoprazole (Protonix)  Polyethylene Glycol (MiraLAX)   Spironolactone (Aldactone)  Sucralfate (Carafate) ==================================================================== For clinical consultation, please call [phone]. ====================================================================    UDS interpretation: Compliant          Medication Assessment Form: Reviewed. Patient indicates being compliant with therapy Treatment compliance: Compliant Risk Assessment Profile: Aberrant behavior: See prior evaluations. None observed or detected today Comorbid factors increasing risk of overdose: See prior notes. No additional risks detected today Risk of substance use disorder (SUD): Low Opioid Risk Tool - 07/31/17 1227      Family History of Substance Abuse   Alcohol  Negative    Illegal Drugs  Negative    Rx Drugs  Negative      Personal History of Substance Abuse   Alcohol  Negative    Illegal Drugs  Negative    Rx Drugs  Negative      Age   Age between 60-45 years   No      History of Preadolescent Sexual Abuse   History of Preadolescent Sexual Abuse  Negative or Female      Psychological Disease   Psychological Disease  Negative    Depression  Positive      Total Score   Opioid Risk Tool Scoring  1    Opioid Risk Interpretation  Low Risk      ORT Scoring interpretation table:  Score <3 = Low Risk for SUD  Score between 4-7 = Moderate Risk for SUD  Score >8 = High Risk for Opioid Abuse   Risk Mitigation Strategies:  Patient Counseling: Covered Patient-Prescriber Agreement (PPA): Present and active  Notification to other healthcare providers: Done  Pharmacologic Plan: No change in therapy, at this time.             Laboratory Chemistry  Inflammation Markers (CRP: Acute Phase) (ESR: Chronic Phase) No results found for: CRP, ESRSEDRATE, LATICACIDVEN                       Rheumatology Markers No results found for: RF, ANA, LABURIC,  Zorita Pang, [facility], [id number]                      Renal Function  Markers Lab Results  Component Value Date   BUN 11 03/24/2017   CREATININE 0.73 03/24/2017   BCR 18 04/28/2014   GFRAA >60 10/02/2015   GFRNONAA >60 10/02/2015                             Hepatic Function Markers Lab Results  Component Value Date   AST 25 03/24/2017   ALT 17 03/24/2017   ALBUMIN 4.4 03/24/2017   ALKPHOS 105 03/24/2017   LIPASE 30 11/23/2013                        Electrolytes Lab Results  Component Value Date   NA 136 06/30/2017   K 4.2 06/30/2017   CL 99 03/24/2017   CALCIUM 9.9 03/24/2017                        Neuropathy Markers Lab Results  Component Value Date   VITAMINB12 1,069 (H) 12/27/2013   HGBA1C 4.6 12/27/2013                        Bone Pathology Markers No results found for: VD25OH, [id number], [id number], [id number], 25OHVITD1, 25OHVITD2, 25OHVITD3, TESTOFREE, TESTOSTERONE                       Coagulation Parameters Lab Results  Component Value Date   INR 1.0 04/28/2014   LABPROT 10.3 04/28/2014   APTT 27 11/22/2013   PLT 209 10/02/2015                        Cardiovascular Markers Lab Results  Component Value Date   CKTOTAL 87 09/01/2010   CKMB 4.1 (H) 09/01/2010   TROPONINI <0.30 11/23/2013   HGB 13.3 06/30/2017   HCT 39.0 06/30/2017                         CA Markers No results found for: CEA, CA125, LABCA2                      Note: Lab results reviewed.  Recent Diagnostic Imaging Results  DG C-Arm 1-60 Min-No Report Fluoroscopy was utilized by the requesting physician.  No radiographic  interpretation.   Complexity Note: Imaging results reviewed. Results shared with Ms. Rosenstock, using Layman's terms.                         Meds   Current Outpatient Medications:  .  amLODipine (NORVASC) 2.5 MG tablet, Take 2.5 mg by mouth daily., Disp: , Rfl:  .  cyclobenzaprine (FLEXERIL) 5 MG tablet, TAKE 1 TABLET(5 MG) BY MOUTH THREE TIMES DAILY AS NEEDED FOR MUSCLE SPASMS, Disp: 90 tablet, Rfl: 0 .  DULoxetine (CYMBALTA)  30 MG capsule, TAKE 1 CAPSULE(30 MG) BY MOUTH DAILY, Disp: 30 capsule, Rfl: 0 .  gabapentin (NEURONTIN) 300 MG capsule, Take 1 capsule (300 mg total) by mouth 2 (two) times daily. (Patient taking differently: Take 300 mg by mouth daily. ), Disp: 60 capsule, Rfl: 2 .  HYDROcodone-acetaminophen (NORCO) 10-325 MG tablet, Take 1 tablet by mouth every 4 (four) hours  as needed for severe pain. For severe breakthrough pain. Take only as needed Rx to last for 30 days, Disp: 180 tablet, Rfl: 0 .  lisinopril (PRINIVIL,ZESTRIL) 40 MG tablet, TAKE 1 TABLET(40 MG) BY MOUTH DAILY (Patient taking differently: TAKE 1 TABLET(40 MG) BY MOUTH DAILY--- takes in am), Disp: 90 tablet, Rfl: 1 .  metoprolol succinate (TOPROL-XL) 25 MG 24 hr tablet, Take 1 tablet (25 mg total) by mouth daily. (Patient taking differently: Take 25 mg by mouth every morning. ), Disp: 30 tablet, Rfl: 10 .  Oxycodone HCl 10 MG TABS, Take 1 tablet (10 mg total) by mouth every 6 (six) hours as needed. For chronic pain To last for 30 days from fill date, Disp: 120 tablet, Rfl: 0 .  pantoprazole (PROTONIX) 40 MG tablet, TAKE 1 TABLET(40 MG) BY MOUTH TWICE DAILY (Patient taking differently: Take 40 mg by mouth 2 (two) times daily. TAKE 1 TABLET(40 MG) BY MOUTH TWICE DAILY), Disp: 60 tablet, Rfl: 2 .  polyethylene glycol (MIRALAX / GLYCOLAX) packet, Take 17 g by mouth daily as needed for mild constipation. , Disp: , Rfl:  .  promethazine (PHENERGAN) 12.5 MG tablet, TAKE 1 TABLET BY MOUTH EVERY 8 HOURS AS NEEDED FOR NAUSEA OR VOMITING, Disp: 60 tablet, Rfl: 0 .  spironolactone (ALDACTONE) 25 MG tablet, TAKE 1 TABLET BY MOUTH DAILY (Patient taking differently: TAKE 1 TABLET BY MOUTH DAILY--- per pt only takes when she has swelling), Disp: 30 tablet, Rfl: 5 .  sucralfate (CARAFATE) 1 g tablet, TAKE 1 TABLET BY MOUTH FOUR TIMES DAILY EVERY NIGHT AT BEDTIME WITH MEALS, Disp: 120 tablet, Rfl: 1 .  methylPREDNISolone (MEDROL) 4 MG TBPK tablet, Follow package  instructions., Disp: 21 tablet, Rfl: 0 No current facility-administered medications for this visit.   Facility-Administered Medications Ordered in Other Visits:  .  epirubicin (ELLENCE) 50 mg in sodium chloride 0.9 % bladder instillation, 50 mg, Bladder Instillation, Once, [name], MD  ROS  Constitutional: Denies any fever or chills Gastrointestinal: No reported hemesis, hematochezia, vomiting, or acute GI distress Musculoskeletal: Denies any acute onset joint swelling, redness, loss of ROM, or weakness Neurological: No reported episodes of acute onset apraxia, aphasia, dysarthria, agnosia, amnesia, paralysis, loss of coordination, or loss of consciousness  Allergies  Ms. Bresee has No Known Allergies.  [place]  Drug: Ms. Tuley  reports that she does not use drugs. Alcohol:  reports that she does not drink alcohol. Tobacco:  reports that she has been smoking cigarettes.  She has a 27.50 pack-year smoking history. She has never used smokeless tobacco. Medical:  has a past medical history of AICD (automatic cardioverter/defibrillator) present (placement 05/07/2014 medtronic    ), Arthritis, Bladder cancer ([place]) (urologist-  dr Junious Silk), Chronic hyponatremia, Chronic lower back pain, Chronic systolic (congestive) heart failure ([place]), COPD with emphysema ([place]), Coronary artery disease (cardiologist-  dr Kathlyn [place]), DDD (degenerative disc disease), thoracolumbar, Ectopic atrial beats, Frequent urination, Full dentures, Gait instability, GERD (gastroesophageal reflux disease), History of iron deficiency anemia (11/2013), History of stomach ulcers (11/2013), Hypertension, LBBB (left bundle branch block), NICM (nonischemic cardiomyopathy) ([place]) (last echo 12-31-2015 ef 55-60%), Nocturia more than twice per night, and Scoliosis. Surgical: Ms. Dockery  has a past surgical history that includes Esophagogastroduodenoscopy (N/A, 11/28/2013); Transurethral resection of bladder tumor (N/A, 12/13/2013);  Transurethral resection of bladder tumor (N/A, 01/17/2014); Cataract extraction w/ intraocular lens  implant, bilateral (Bilateral, right 12-2013 / left  02-2014); Cystoscopy with retrograde pyelogram, ureteroscopy and stent placement (Bilateral, 04/18/2014); Transurethral resection of  bladder tumor (N/A, 04/18/2014); Lumbar disc surgery (1980's); bi-ventricular implantable cardioverter defibrillator (N/A, 05/07/2014); Cystoscopy w/ retrogrades (Bilateral, 01/06/2015); Cystoscopy with biopsy (N/A, 10/06/2015); Cardiac catheterization (03-03-2014  dr Kathlyn [place]   [facility]); Transurethral resection of bladder (1995); Cardiovascular stress test (11/25/2013); transthoracic echocardiogram (12/31/2015   dr Caryl Comes); and Cystoscopy with fulgeration (N/A, 06/30/2017). Family: family history includes Cancer in her brother and mother.  Constitutional Exam  General appearance: Well nourished, well developed, and well hydrated. In no apparent acute distress Vitals:   08/31/17 1127  BP: (!) 157/77  Pulse: 70  Resp: 18  Temp: 98.6 F (37 C)  TempSrc: Oral  SpO2: 99%  Weight: 125 lb (56.7 kg)  Height: 5' 8"  (1.727 m)   BMI Assessment: Estimated body mass index is 19.01 kg/m as calculated from the following:   Height as of this encounter: 5' 8"  (1.727 m).   Weight as of this encounter: 125 lb (56.7 kg).  BMI interpretation table: BMI level Category Range association with higher incidence of chronic pain  <18 kg/m2 Underweight   18.5-24.9 kg/m2 Ideal body weight   25-29.9 kg/m2 Overweight Increased incidence by 20%  30-34.9 kg/m2 Obese (Class I) Increased incidence by 68%  35-39.9 kg/m2 Severe obesity (Class II) Increased incidence by 136%  >40 kg/m2 Extreme obesity (Class III) Increased incidence by 254%   Patient's current BMI Ideal Body weight  Body mass index is 19.01 kg/m. Ideal body weight: 63.9 kg (140 lb 14 oz)   BMI Readings from Last 4 Encounters:  08/31/17 19.01 kg/m  07/31/17 19.01 kg/m   07/11/17 19.58 kg/m  06/30/17 18.69 kg/m   Wt Readings from Last 4 Encounters:  08/31/17 125 lb (56.7 kg)  07/31/17 125 lb (56.7 kg)  07/11/17 125 lb (56.7 kg)  06/30/17 121 lb 1.6 oz (54.9 kg)  Psych/Mental status: Alert, oriented x 3 (person, place, & time)       Eyes: PERLA Respiratory: No evidence of acute respiratory distress  Cervical Spine Area Exam  Skin & Axial Inspection: No masses, redness, edema, swelling, or associated skin lesions Alignment: Symmetrical Functional ROM: Unrestricted ROM      Stability: No instability detected Muscle Tone/Strength: Functionally intact. No obvious neuro-muscular anomalies detected. Sensory (Neurological): Unimpaired Palpation: No palpable anomalies              Upper Extremity (UE) Exam    Side: Right upper extremity  Side: Left upper extremity  Skin & Extremity Inspection: Skin color, temperature, and hair growth are WNL. No peripheral edema or cyanosis. No masses, redness, swelling, asymmetry, or associated skin lesions. No contractures.  Skin & Extremity Inspection: Skin color, temperature, and hair growth are WNL. No peripheral edema or cyanosis. No masses, redness, swelling, asymmetry, or associated skin lesions. No contractures.  Functional ROM: Unrestricted ROM          Functional ROM: Unrestricted ROM          Muscle Tone/Strength: Functionally intact. No obvious neuro-muscular anomalies detected.  Muscle Tone/Strength: Functionally intact. No obvious neuro-muscular anomalies detected.  Sensory (Neurological): Unimpaired          Sensory (Neurological): Unimpaired          Palpation: No palpable anomalies              Palpation: No palpable anomalies              Provocative Test(s):  Phalen's test: deferred Tinel's test: deferred Apley's scratch test (touch opposite shoulder):  Action 1 (Across chest): deferred  Action 2 (Overhead): deferred Action 3 (LB reach): deferred   Provocative Test(s):  Phalen's test:  deferred Tinel's test: deferred Apley's scratch test (touch opposite shoulder):  Action 1 (Across chest): deferred Action 2 (Overhead): deferred Action 3 (LB reach): deferred    Thoracic Spine Area Exam  Skin & Axial Inspection: No masses, redness, or swelling Alignment: Symmetrical Functional ROM: Unrestricted ROM Stability: No instability detected Muscle Tone/Strength: Functionally intact. No obvious neuro-muscular anomalies detected. Sensory (Neurological): Unimpaired Muscle strength & Tone: No palpable anomalies  Lumbar Spine Area Exam  Skin & Axial Inspection: No masses, redness, or swelling Alignment: Symmetrical Functional ROM: Decreased ROM       Stability: No instability detected Muscle Tone/Strength: Functionally intact. No obvious neuro-muscular anomalies detected. Sensory (Neurological): Musculoskeletal pain pattern Palpation: Complains of area being tender to palpation Bilateral Fist Percussion Test Provocative Tests: Lumbar Hyperextension/rotation test: (+) bilaterally for facet joint pain. Lumbar quadrant test (Kemp's test): (+) due to pain. Lumbar Lateral bending test: (+) due to pain. Patrick's Maneuver: deferred today                   FABER test: deferred today       Thigh-thrust test: deferred today       S-I compression test: deferred today       S-I distraction test: deferred today        Gait & Posture Assessment  Ambulation: Limited Gait: Limited. Using assistive device to ambulate Posture: Thoracic kyphosis   Lower Extremity Exam    Side: Right lower extremity  Side: Left lower extremity  Stability: No instability observed          Stability: No instability observed          Skin & Extremity Inspection: Skin color, temperature, and hair growth are WNL. No peripheral edema or cyanosis. No masses, redness, swelling, asymmetry, or associated skin lesions. No contractures.  Skin & Extremity Inspection: Skin color, temperature, and hair growth are WNL.  No peripheral edema or cyanosis. No masses, redness, swelling, asymmetry, or associated skin lesions. No contractures.  Functional ROM: Unrestricted ROM                  Functional ROM: Unrestricted ROM                  Muscle Tone/Strength: Functionally intact. No obvious neuro-muscular anomalies detected.  Muscle Tone/Strength: Functionally intact. No obvious neuro-muscular anomalies detected.  Sensory (Neurological): Unimpaired  Sensory (Neurological): Unimpaired  Palpation: No palpable anomalies  Palpation: No palpable anomalies   Assessment  Primary Diagnosis & Pertinent Problem List: The primary encounter diagnosis was Lumbar spondylosis. Diagnoses of SI joint arthritis, Lumbar degenerative disc disease, Chronic pain syndrome, Spinal stenosis, lumbar region, with neurogenic claudication, and Lumbar facet arthropathy were also pertinent to this visit.  Status Diagnosis  Controlled Controlled Controlled 1. Lumbar spondylosis   2. SI joint arthritis   3. Lumbar degenerative disc disease   4. Chronic pain syndrome   5. Spinal stenosis, lumbar region, with neurogenic claudication   6. Lumbar facet arthropathy      General Recommendations: The pain condition that the patient suffers from is best treated with a multidisciplinary approach that involves an increase in physical activity to prevent de-conditioning and worsening of the pain cycle, as well as psychological counseling (formal and/or informal) to address the co-morbid psychological affects of pain. Treatment will often involve judicious use of pain medications and interventional procedures to decrease the pain, allowing the  patient to participate in the physical activity that will ultimately produce long-lasting pain reductions. The goal of the multidisciplinary approach is to return the patient to a higher level of overall function and to restore their ability to perform activities of daily living.  77 year old female with a  history of axial low back pain secondary to lumbar spondylosis, degenerative disc disease, SI joint arthritis.Patient presents today for medication refill.  Patient's last visit, she was started on long-acting, extended release hydrocodone 10 mg twice daily in addition to her short acting hydrocodone 10 mg every 4 hours as needed which she does not find effective.  We had an extensive discussion about future therapeutic options.  Given that the patient is having worsening whole body pain localized to various joints including her shoulders, SI joints along with axial low back pain as radiating into her legs, we discussed a Medrol Dosepak.  Risks and benefits of steroid therapy were discussed with the patient including acute increases in blood pressure, irritability, difficulty sleeping, confusion, nausea, endocrine abnormalities, derangements and blood sugars.  Patient would like to proceed with Dosepak.  Furthermore we also discussed spinal cord stimulation.  I provided the patient with resources regarding thoracolumbar spinal cord stimulation and how it could be helpful for her chronic pain state.  Patient is going to do research on her own about spinal cord stimulator therapy and we will discuss further at her next appointment.  Plan: -Discontinue extended release hydrocodone -Continue short acting hydrocodone 10 mg every 4 hours as needed, quantity 180/month.  Prescription provided below for 1 month. -Medrol Dosepak as below -Resources provided for thoracolumbar spinal cord stimulation, will discuss further at next visit.  If patient would like to pursue SCS trial, will need CT of thoracic and lumbar spine along with pain psychological evaluation.  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 -Continue gabapentin and Flexeril as previously prescribed  Future considerations: Thoracolumbar spinal cord stimulation (patient has pacemaker in place, MRI is  contraindicated)  Plan of Care  Pharmacotherapy (Medications Ordered): Meds ordered this encounter  Medications  . HYDROcodone-acetaminophen (NORCO) 10-325 MG tablet    Sig: Take 1 tablet by mouth every 4 (four) hours as needed for severe pain. For severe breakthrough pain. Take only as needed Rx to last for 30 days    Dispense:  180 tablet    Refill:  0    Do not place this medication, or any other prescription from our practice, on "Automatic Refill". Patient may have prescription filled one day early if pharmacy is closed on scheduled refill date.  . methylPREDNISolone (MEDROL) 4 MG TBPK tablet    Sig: Follow package instructions.    Dispense:  21 tablet    Refill:  0    Do not add to the "Automatic Refill" notification system.    Provider-requested follow-up: Return in about 1 month (around 09/27/2017) for Medication Management. Time Note: Greater than 50% of the 25 minute(s) of face-to-face time spent with Ms. Gaona, was spent in counseling/coordination of care regarding: Ms. [place] primary cause of pain, the treatment plan, treatment alternatives, the risks and possible complications of proposed treatment, medication side effects, the opioid analgesic risks and possible complications, the appropriate use of her medications, realistic expectations, the goals of pain management (increased in functionality), the medication agreement and the patient's responsibilities when it comes to controlled substances.  Future Appointments  Date Time Provider [place]  09/27/2017  1:45 PM [name], MD [facility]  None  11/28/2017  7:45 AM [facility] DEVICE REMOTES [facility]  06/13/2018  3:00 PM O'Brien-Blaney, Denisa L, LPN [id number]    Primary Care Physician: [name], MD Location: [facility] Note by: [name], M.D Date: 08/31/2017; Time: 1:40 PM  Patient Instructions  You were given one prescription for Hydrocodone  today. A prescription for Medrol Dosepak was sent to your pharmacy.

## 2017-08-31 NOTE — Progress Notes (Signed)
Nursing Pain Medication Assessment:  Safety precautions to be maintained throughout the outpatient stay will include: orient to surroundings, keep bed in low position, maintain call bell within reach at all times, provide assistance with transfer out of bed and ambulation.  Medication Inspection Compliance: Pill count conducted under aseptic conditions, in front of the patient. Neither the pills nor the bottle was removed from the patient's sight at any time. Once count was completed pills were immediately returned to the patient in their original bottle.  Medication #1: Hydrocodone/APAP Pill/Patch Count: 6 of 120 pills remain Pill/Patch Appearance: Markings consistent with prescribed medication Bottle Appearance: Standard pharmacy container. Clearly labeled. Filled Date:05/13/ 2019 Last Medication intake:  Today  Medication #2: zohydro  Pill/Patch Count: 0 of 60 pills remain Pill/Patch Appearance: Markings consistent with prescribed medication Bottle Appearance: Standard pharmacy container. Clearly labeled. Filled Date: 05/13 / 2018 Last Medication intake:  Today

## 2017-09-05 LAB — CUP PACEART REMOTE DEVICE CHECK
Battery Voltage: 2.95 V
Brady Statistic AP VP Percent: 53.47 %
Brady Statistic AS VP Percent: 43.92 %
Brady Statistic RA Percent Paced: 52.93 %
Date Time Interrogation Session: 20190611052303
HighPow Impedance: 67 Ohm
Implantable Lead Implant Date: 20160217
Implantable Lead Implant Date: 20160217
Implantable Lead Location: 753858
Implantable Lead Model: 4298
Lead Channel Impedance Value: 399 Ohm
Lead Channel Impedance Value: 418 Ohm
Lead Channel Impedance Value: 418 Ohm
Lead Channel Impedance Value: 418 Ohm
Lead Channel Impedance Value: 475 Ohm
Lead Channel Impedance Value: 646 Ohm
Lead Channel Impedance Value: 703 Ohm
Lead Channel Pacing Threshold Amplitude: 0.5 V
Lead Channel Pacing Threshold Amplitude: 0.625 V
Lead Channel Pacing Threshold Pulse Width: 0.4 ms
Lead Channel Sensing Intrinsic Amplitude: 19 mV
Lead Channel Sensing Intrinsic Amplitude: 19 mV
Lead Channel Setting Pacing Amplitude: 2 V
Lead Channel Setting Pacing Pulse Width: 0.4 ms
Lead Channel Setting Sensing Sensitivity: 0.3 mV
MDC IDC LEAD IMPLANT DT: 20160217
MDC IDC LEAD LOCATION: 753859
MDC IDC LEAD LOCATION: 753860
MDC IDC MSMT BATTERY REMAINING LONGEVITY: 39 mo
MDC IDC MSMT LEADCHNL LV IMPEDANCE VALUE: 475 Ohm
MDC IDC MSMT LEADCHNL LV IMPEDANCE VALUE: 722 Ohm
MDC IDC MSMT LEADCHNL LV IMPEDANCE VALUE: 760 Ohm
MDC IDC MSMT LEADCHNL LV IMPEDANCE VALUE: 779 Ohm
MDC IDC MSMT LEADCHNL LV PACING THRESHOLD AMPLITUDE: 0.875 V
MDC IDC MSMT LEADCHNL LV PACING THRESHOLD PULSEWIDTH: 0.4 ms
MDC IDC MSMT LEADCHNL RA PACING THRESHOLD PULSEWIDTH: 0.4 ms
MDC IDC MSMT LEADCHNL RA SENSING INTR AMPL: 3.375 mV
MDC IDC MSMT LEADCHNL RA SENSING INTR AMPL: 3.375 mV
MDC IDC MSMT LEADCHNL RV IMPEDANCE VALUE: 399 Ohm
MDC IDC MSMT LEADCHNL RV IMPEDANCE VALUE: 513 Ohm
MDC IDC PG IMPLANT DT: 20160217
MDC IDC SET LEADCHNL RA PACING AMPLITUDE: 2 V
MDC IDC SET LEADCHNL RV PACING AMPLITUDE: 2.5 V
MDC IDC SET LEADCHNL RV PACING PULSEWIDTH: 0.4 ms
MDC IDC STAT BRADY AP VS PERCENT: 0.92 %
MDC IDC STAT BRADY AS VS PERCENT: 1.69 %
MDC IDC STAT BRADY RV PERCENT PACED: 92.74 %

## 2017-09-05 NOTE — Telephone Encounter (Signed)
Have left messages several times with no return phone call from pt.

## 2017-09-08 ENCOUNTER — Other Ambulatory Visit: Payer: Self-pay | Admitting: Internal Medicine

## 2017-09-11 NOTE — Telephone Encounter (Signed)
Dose reduced from 5 mg  to 2.5 mg daily per your observations

## 2017-09-11 NOTE — Telephone Encounter (Signed)
Gabapentin      Refilled: 03/15/2017  Amlodipine      Historical medication. 10mg  was discontinued and dose was changed to 2.5mg  looks like by Dr. Holley Raring.  Last OV: 03/24/2017 Next OV: not scheduled

## 2017-09-27 ENCOUNTER — Ambulatory Visit
Payer: Medicare Other | Attending: Student in an Organized Health Care Education/Training Program | Admitting: Student in an Organized Health Care Education/Training Program

## 2017-09-27 ENCOUNTER — Encounter: Payer: Self-pay | Admitting: Student in an Organized Health Care Education/Training Program

## 2017-09-27 VITALS — BP 164/99 | HR 70 | Temp 98.5°F | Resp 16 | Ht 68.0 in | Wt 125.0 lb

## 2017-09-27 DIAGNOSIS — M5416 Radiculopathy, lumbar region: Secondary | ICD-10-CM

## 2017-09-27 DIAGNOSIS — I447 Left bundle-branch block, unspecified: Secondary | ICD-10-CM | POA: Insufficient documentation

## 2017-09-27 DIAGNOSIS — K219 Gastro-esophageal reflux disease without esophagitis: Secondary | ICD-10-CM | POA: Insufficient documentation

## 2017-09-27 DIAGNOSIS — M5116 Intervertebral disc disorders with radiculopathy, lumbar region: Secondary | ICD-10-CM | POA: Diagnosis not present

## 2017-09-27 DIAGNOSIS — Z95810 Presence of automatic (implantable) cardiac defibrillator: Secondary | ICD-10-CM | POA: Insufficient documentation

## 2017-09-27 DIAGNOSIS — M4126 Other idiopathic scoliosis, lumbar region: Secondary | ICD-10-CM | POA: Diagnosis not present

## 2017-09-27 DIAGNOSIS — R2689 Other abnormalities of gait and mobility: Secondary | ICD-10-CM | POA: Diagnosis not present

## 2017-09-27 DIAGNOSIS — Z8551 Personal history of malignant neoplasm of bladder: Secondary | ICD-10-CM | POA: Diagnosis not present

## 2017-09-27 DIAGNOSIS — M5136 Other intervertebral disc degeneration, lumbar region: Secondary | ICD-10-CM

## 2017-09-27 DIAGNOSIS — E785 Hyperlipidemia, unspecified: Secondary | ICD-10-CM | POA: Diagnosis not present

## 2017-09-27 DIAGNOSIS — I428 Other cardiomyopathies: Secondary | ICD-10-CM | POA: Diagnosis not present

## 2017-09-27 DIAGNOSIS — M48062 Spinal stenosis, lumbar region with neurogenic claudication: Secondary | ICD-10-CM

## 2017-09-27 DIAGNOSIS — I110 Hypertensive heart disease with heart failure: Secondary | ICD-10-CM | POA: Insufficient documentation

## 2017-09-27 DIAGNOSIS — Z9841 Cataract extraction status, right eye: Secondary | ICD-10-CM | POA: Insufficient documentation

## 2017-09-27 DIAGNOSIS — M4805 Spinal stenosis, thoracolumbar region: Secondary | ICD-10-CM | POA: Insufficient documentation

## 2017-09-27 DIAGNOSIS — Z9842 Cataract extraction status, left eye: Secondary | ICD-10-CM | POA: Diagnosis not present

## 2017-09-27 DIAGNOSIS — F17210 Nicotine dependence, cigarettes, uncomplicated: Secondary | ICD-10-CM | POA: Diagnosis not present

## 2017-09-27 DIAGNOSIS — M4802 Spinal stenosis, cervical region: Secondary | ICD-10-CM | POA: Diagnosis not present

## 2017-09-27 DIAGNOSIS — G894 Chronic pain syndrome: Secondary | ICD-10-CM | POA: Insufficient documentation

## 2017-09-27 DIAGNOSIS — J439 Emphysema, unspecified: Secondary | ICD-10-CM | POA: Diagnosis not present

## 2017-09-27 DIAGNOSIS — Z961 Presence of intraocular lens: Secondary | ICD-10-CM | POA: Diagnosis not present

## 2017-09-27 DIAGNOSIS — Z8711 Personal history of peptic ulcer disease: Secondary | ICD-10-CM | POA: Diagnosis not present

## 2017-09-27 DIAGNOSIS — Z79899 Other long term (current) drug therapy: Secondary | ICD-10-CM | POA: Diagnosis not present

## 2017-09-27 DIAGNOSIS — M545 Low back pain: Secondary | ICD-10-CM | POA: Diagnosis present

## 2017-09-27 DIAGNOSIS — I5022 Chronic systolic (congestive) heart failure: Secondary | ICD-10-CM | POA: Insufficient documentation

## 2017-09-27 MED ORDER — HYDROCODONE-ACETAMINOPHEN 10-325 MG PO TABS: 1.0000 | ORAL_TABLET | ORAL | 0 refills | Status: DC | PRN

## 2017-09-27 NOTE — Progress Notes (Signed)
Patient's Name: [name]  MRN: [id number]  Referring Provider: [name], MD  DOB: 12-25-1940  PCP: [name], MD  DOS: 09/27/2017  Note by: [name], MD  Service setting: Ambulatory outpatient  Specialty: Interventional Pain Management  Location: [facility] (AMB) Pain Management Facility    Patient type: Established   Primary Reason(s) for Visit: Encounter for prescription drug management. (Level of risk: moderate)  CC: Back Pain (lower left is worse )  HPI  [name] is a 77 y.o. year old, female patient, who comes today for a medication management evaluation. She has Palpitations; Hyponatremia; Bladder cancer ([place]); LBBB (left bundle branch block); Atrial ectopy; Congestive dilated cardiomyopathy ([place]); History of gastric ulcer; Spinal stenosis of thoracolumbar region; COPD (chronic obstructive pulmonary disease) ([place]); Constipation; Spinal stenosis in cervical region; Chronic bilateral low back pain; NSAID induced gastritis; Parkinsonian features; Hypertension; Spinal stenosis of lumbar region without neurogenic claudication; Other idiopathic scoliosis, lumbar region; Hyperlipidemia; S/P placement of cardiac pacemaker; Coronary artery disease; Lumbar spondylosis; SI joint arthritis; and Lumbar degenerative disc disease on their problem list. Her primarily concern today is the Back Pain (lower left is worse )  Pain Assessment: Location: Lower, Left, Right Back Radiating: into the buttocks and left hip  Onset: More than a month ago Duration: Chronic pain Quality: Discomfort Severity: 6 /10 (subjective, self-reported pain score)  Note: Reported level is compatible with observation.                         When using our objective Pain Scale, levels between 6 and 10/10 are said to belong in an emergency room, as it progressively worsens from a 6/10, described as severely limiting, requiring emergency care not usually available at an outpatient pain management facility. At a 6/10 level,  communication becomes difficult and requires great effort. Assistance to reach the emergency department may be required. Facial flushing and profuse sweating along with potentially dangerous increases in heart rate and blood pressure will be evident. Effect on ADL:   Timing:   Modifying factors:   BP: (!) 164/99  HR: 70  [name] was last scheduled for an appointment on 08/31/2017 for medication management. During today's appointment we reviewed [name] chronic pain status, as well as her outpatient medication regimen.  The patient  reports that she does not use drugs. Her body mass index is 19.01 kg/m.  Further details on both, my assessment(s), as well as the proposed treatment plan, please see below.  Patient returns today for medication management.  She is still continue to endorse severe axial low back pain.  At her last visit, she was prescribed a Medrol Dosepak taper which she states was not beneficial for her pain symptoms.  Controlled Substance Pharmacotherapy Assessment REMS (Risk Evaluation and Mitigation Strategy)  Analgesic: Hydrocodone 10 mg every 4 hours as needed, quantity 180 MME/day: 60 mg/day.   Janett Billow, RN  09/27/2017  1:44 PM  Sign at close encounter Nursing Pain Medication Assessment:  Safety precautions to be maintained throughout the outpatient stay will include: orient to surroundings, keep bed in low position, maintain call bell within reach at all times, provide assistance with transfer out of bed and ambulation.  Medication Inspection Compliance: Pill count conducted under aseptic conditions, in front of the patient. Neither the pills nor the bottle was removed from the patient's sight at any time. Once count was completed pills were immediately returned to the patient in their original  bottle.  Medication: Hydrocodone/APAP Pill/Patch Count: 18 of 180 pills remain Pill/Patch Appearance: Markings consistent with prescribed medication Bottle  Appearance: Standard pharmacy container. Clearly labeled. Filled Date: 06 / 13 / 2019 Last Medication intake:  Today   Pharmacokinetics: Liberation and absorption (onset of action): WNL Distribution (time to peak effect): WNL Metabolism and excretion (duration of action): WNL         Pharmacodynamics: Desired effects: Analgesia: [name] reports >50% benefit. Functional ability: Patient reports that medication allows her to accomplish basic ADLs Clinically meaningful improvement in function (CMIF): Sustained CMIF goals met Perceived effectiveness: Described as relatively effective, allowing for increase in activities of daily living (ADL) Undesirable effects: Side-effects or Adverse reactions: None reported Monitoring: [place] PMP: Online review of the past 46-monthperiod conducted. Compliant with practice rules and regulations Last UDS on record: Summary  Date Value Ref Range Status  12/27/2016 FINAL  Final    Comment:    ==================================================================== TOXASSURE COMP DRUG ANALYSIS,UR ==================================================================== Test                             Result       Flag       Units Drug Present and Declared for Prescription Verification   Hydrocodone                    3156         EXPECTED   ng/mg creat   Hydromorphone                  848          EXPECTED   ng/mg creat   Dihydrocodeine                 236          EXPECTED   ng/mg creat   Norhydrocodone                 3780         EXPECTED   ng/mg creat    Sources of hydrocodone include scheduled prescription    medications. Hydromorphone, dihydrocodeine and norhydrocodone are    expected metabolites of hydrocodone. Hydromorphone and    dihydrocodeine are also available as scheduled prescription    medications.   Gabapentin                     PRESENT      EXPECTED   Cyclobenzaprine                PRESENT      EXPECTED   Desmethylcyclobenzaprine        PRESENT      EXPECTED    Desmethylcyclobenzaprine is an expected metabolite of    cyclobenzaprine.   Acetaminophen                  PRESENT      EXPECTED   Metoprolol                     PRESENT      EXPECTED Drug Absent but Declared for Prescription Verification   Duloxetine                     Not Detected UNEXPECTED   Promethazine                   Not Detected UNEXPECTED ==================================================================== Test  Result    Flag   Units      Ref Range   Creatinine              25               mg/dL      >=20 ==================================================================== Declared Medications:  The flagging and interpretation on this report are based on the  following declared medications.  Unexpected results may arise from  inaccuracies in the declared medications.  **Note: The testing scope of this panel includes these medications:  Cyclobenzaprine (Flexeril)  Duloxetine (Cymbalta)  Gabapentin (Neurontin)  Hydrocodone (Norco)  Metoprolol (Toprol)  Promethazine (Phenergan)  **Note: The testing scope of this panel does not include small to  moderate amounts of these reported medications:  Acetaminophen (Norco)  **Note: The testing scope of this panel does not include following  reported medications:  Amlodipine (Norvasc)  Lisinopril (Prinivil)  Pantoprazole (Protonix)  Polyethylene Glycol (MiraLAX)  Spironolactone (Aldactone)  Sucralfate (Carafate) ==================================================================== For clinical consultation, please call [phone]. ====================================================================    UDS interpretation: Compliant          Medication Assessment Form: Reviewed. Patient indicates being compliant with therapy Treatment compliance: Compliant Risk Assessment Profile: Aberrant behavior: See prior evaluations. None observed or detected today Comorbid factors  increasing risk of overdose: See prior notes. No additional risks detected today Risk of substance use disorder (SUD): Low Opioid Risk Tool - 07/31/17 1227      Family History of Substance Abuse   Alcohol  Negative    Illegal Drugs  Negative    Rx Drugs  Negative      Personal History of Substance Abuse   Alcohol  Negative    Illegal Drugs  Negative    Rx Drugs  Negative      Age   Age between 65-45 years   No      History of Preadolescent Sexual Abuse   History of Preadolescent Sexual Abuse  Negative or Female      Psychological Disease   Psychological Disease  Negative    Depression  Positive      Total Score   Opioid Risk Tool Scoring  1    Opioid Risk Interpretation  Low Risk      ORT Scoring interpretation table:  Score <3 = Low Risk for SUD  Score between 4-7 = Moderate Risk for SUD  Score >8 = High Risk for Opioid Abuse   Risk Mitigation Strategies:  Patient Counseling: Covered Patient-Prescriber Agreement (PPA): Present and active  Notification to other healthcare providers: Done  Pharmacologic Plan: No change in therapy, at this time.             Laboratory Chemistry  Inflammation Markers (CRP: Acute Phase) (ESR: Chronic Phase) No results found for: CRP, ESRSEDRATE, LATICACIDVEN                       Rheumatology Markers No results found for: RF, ANA, LABURIC, URICUR, LYMEIGGIGMAB, LYMEABIGMQN, HLAB27                      Renal Function Markers Lab Results  Component Value Date   BUN 11 03/24/2017   CREATININE 0.73 03/24/2017   BCR 18 04/28/2014   GFRAA >60 10/02/2015   GFRNONAA >60 10/02/2015  Hepatic Function Markers Lab Results  Component Value Date   AST 25 03/24/2017   ALT 17 03/24/2017   ALBUMIN 4.4 03/24/2017   ALKPHOS 105 03/24/2017   LIPASE 30 11/23/2013                        Electrolytes Lab Results  Component Value Date   NA 136 06/30/2017   K 4.2 06/30/2017   CL 99 03/24/2017   CALCIUM 9.9  03/24/2017                        Neuropathy Markers Lab Results  Component Value Date   VITAMINB12 1,069 (H) 12/27/2013   HGBA1C 4.6 12/27/2013                        Bone Pathology Markers No results found for: VD25OH, [id number], WI0973ZH2, DJ2426ST4, 25OHVITD1, 25OHVITD2, 25OHVITD3, TESTOFREE, TESTOSTERONE                       Coagulation Parameters Lab Results  Component Value Date   INR 1.0 04/28/2014   LABPROT 10.3 04/28/2014   APTT 27 11/22/2013   PLT 209 10/02/2015                        Cardiovascular Markers Lab Results  Component Value Date   CKTOTAL 87 09/01/2010   CKMB 4.1 (H) 09/01/2010   TROPONINI <0.30 11/23/2013   HGB 13.3 06/30/2017   HCT 39.0 06/30/2017                         CA Markers No results found for: CEA, CA125, LABCA2                      Note: Lab results reviewed.  Recent Diagnostic Imaging Results  CUP PACEART REMOTE DEVICE CHECK Normal remote reviewed. Stable thoracic impedance. 95.3%Bp, 2.6%VSRp. Next follow up via [facility] on 9/10.[facility]  Complexity Note: Imaging results reviewed. Results shared with Ms. Briguglio, using Layman's terms.                         Meds   Current Outpatient Medications:  .  amLODipine (NORVASC) 2.5 MG tablet, Take 2.5 mg by mouth daily., Disp: , Rfl:  .  gabapentin (NEURONTIN) 300 MG capsule, TAKE 1 CAPSULE(300 MG) BY MOUTH TWICE DAILY, Disp: 60 capsule, Rfl: 5 .  [START ON 09/28/2017] HYDROcodone-acetaminophen (NORCO) 10-325 MG tablet, Take 1 tablet by mouth every 4 (four) hours as needed for severe pain. For severe breakthrough pain. Take only as needed Rx to last for 30 days, Disp: 180 tablet, Rfl: 0 .  lisinopril (PRINIVIL,ZESTRIL) 40 MG tablet, TAKE 1 TABLET(40 MG) BY MOUTH DAILY (Patient taking differently: TAKE 1 TABLET(40 MG) BY MOUTH DAILY--- takes in am), Disp: 90 tablet, Rfl: 1 .  metoprolol succinate (TOPROL-XL) 25 MG 24 hr tablet, Take 1 tablet (25 mg total) by mouth daily.  (Patient taking differently: Take 25 mg by mouth every morning. ), Disp: 30 tablet, Rfl: 10 .  pantoprazole (PROTONIX) 40 MG tablet, TAKE 1 TABLET(40 MG) BY MOUTH TWICE DAILY (Patient taking differently: Take 40 mg by mouth 2 (two) times daily. TAKE 1 TABLET(40 MG) BY MOUTH TWICE DAILY), Disp: 60 tablet, Rfl: 2 .  polyethylene glycol (MIRALAX / GLYCOLAX) packet, Take  17 g by mouth daily as needed for mild constipation. , Disp: , Rfl:  .  promethazine (PHENERGAN) 12.5 MG tablet, TAKE 1 TABLET BY MOUTH EVERY 8 HOURS AS NEEDED FOR NAUSEA OR VOMITING, Disp: 60 tablet, Rfl: 0 .  sucralfate (CARAFATE) 1 g tablet, TAKE 1 TABLET BY MOUTH FOUR TIMES DAILY EVERY NIGHT AT BEDTIME WITH MEALS, Disp: 120 tablet, Rfl: 1 No current facility-administered medications for this visit.   Facility-Administered Medications Ordered in Other Visits:  .  epirubicin (ELLENCE) 50 mg in sodium chloride 0.9 % bladder instillation, 50 mg, Bladder Instillation, Once, [name], MD  ROS  Constitutional: Denies any fever or chills Gastrointestinal: No reported hemesis, hematochezia, vomiting, or acute GI distress Musculoskeletal: Denies any acute onset joint swelling, redness, loss of ROM, or weakness Neurological: No reported episodes of acute onset apraxia, aphasia, dysarthria, agnosia, amnesia, paralysis, loss of coordination, or loss of consciousness  Allergies  Ms. Butkiewicz has No Known Allergies.  [place]  Drug: Ms. Helinski  reports that she does not use drugs. Alcohol:  reports that she does not drink alcohol. Tobacco:  reports that she has been smoking cigarettes.  She has a 27.50 pack-year smoking history. She has never used smokeless tobacco. Medical:  has a past medical history of AICD (automatic cardioverter/defibrillator) present (placement 05/07/2014 medtronic    ), Arthritis, Bladder cancer ([place]) (urologist-  dr Junious Silk), Chronic hyponatremia, Chronic lower back pain, Chronic systolic (congestive) heart failure  ([place]), COPD with emphysema ([place]), Coronary artery disease (cardiologist-  dr Kathlyn [place]), DDD (degenerative disc disease), thoracolumbar, Ectopic atrial beats, Frequent urination, Full dentures, Gait instability, GERD (gastroesophageal reflux disease), History of iron deficiency anemia (11/2013), History of stomach ulcers (11/2013), Hypertension, LBBB (left bundle branch block), NICM (nonischemic cardiomyopathy) ([place]) (last echo 12-31-2015 ef 55-60%), Nocturia more than twice per night, and Scoliosis. Surgical: Ms. Marcon  has a past surgical history that includes Esophagogastroduodenoscopy (N/A, 11/28/2013); Transurethral resection of bladder tumor (N/A, 12/13/2013); Transurethral resection of bladder tumor (N/A, 01/17/2014); Cataract extraction w/ intraocular lens  implant, bilateral (Bilateral, right 12-2013 / left  02-2014); Cystoscopy with retrograde pyelogram, ureteroscopy and stent placement (Bilateral, 04/18/2014); Transurethral resection of bladder tumor (N/A, 04/18/2014); Lumbar disc surgery (1980's); bi-ventricular implantable cardioverter defibrillator (N/A, 05/07/2014); Cystoscopy w/ retrogrades (Bilateral, 01/06/2015); Cystoscopy with biopsy (N/A, 10/06/2015); Cardiac catheterization (03-03-2014  dr Kathlyn [place]   [facility]); Transurethral resection of bladder (1995); Cardiovascular stress test (11/25/2013); transthoracic echocardiogram (12/31/2015   dr Caryl Comes); and Cystoscopy with fulgeration (N/A, 06/30/2017). Family: family history includes Cancer in her brother and mother.  Constitutional Exam  General appearance: Well nourished, well developed, and well hydrated. In no apparent acute distress Vitals:   09/27/17 1340 09/27/17 1355  BP: (!) 171/100 (!) 164/99  Pulse: 70   Resp: 16   Temp: 98.5 F (36.9 C)   TempSrc: Oral   SpO2: 100%   Weight: 125 lb (56.7 kg)   Height: 5' 8"  (1.727 m)    BMI Assessment: Estimated body mass index is 19.01 kg/m as calculated from the following:   Height as  of this encounter: 5' 8"  (1.727 m).   Weight as of this encounter: 125 lb (56.7 kg).  BMI interpretation table: BMI level Category Range association with higher incidence of chronic pain  <18 kg/m2 Underweight   18.5-24.9 kg/m2 Ideal body weight   25-29.9 kg/m2 Overweight Increased incidence by 20%  30-34.9 kg/m2 Obese (Class I) Increased incidence by 68%  35-39.9 kg/m2 Severe obesity (Class II) Increased incidence by 136%  >  40 kg/m2 Extreme obesity (Class III) Increased incidence by 254%   Patient's current BMI Ideal Body weight  Body mass index is 19.01 kg/m. Ideal body weight: 63.9 kg (140 lb 14 oz)   BMI Readings from Last 4 Encounters:  09/27/17 19.01 kg/m  08/31/17 19.01 kg/m  07/31/17 19.01 kg/m  07/11/17 19.58 kg/m   Wt Readings from Last 4 Encounters:  09/27/17 125 lb (56.7 kg)  08/31/17 125 lb (56.7 kg)  07/31/17 125 lb (56.7 kg)  07/11/17 125 lb (56.7 kg)  Psych/Mental status: Alert, oriented x 3 (person, place, & time)       Eyes: PERLA Respiratory: No evidence of acute respiratory distress  Cervical Spine Area Exam  Skin & Axial Inspection: No masses, redness, edema, swelling, or associated skin lesions Alignment: Symmetrical Functional ROM: Unrestricted ROM      Stability: No instability detected Muscle Tone/Strength: Functionally intact. No obvious neuro-muscular anomalies detected. Sensory (Neurological): Unimpaired Palpation: No palpable anomalies              Upper Extremity (UE) Exam    Side: Right upper extremity  Side: Left upper extremity  Skin & Extremity Inspection: Skin color, temperature, and hair growth are WNL. No peripheral edema or cyanosis. No masses, redness, swelling, asymmetry, or associated skin lesions. No contractures.  Skin & Extremity Inspection: Skin color, temperature, and hair growth are WNL. No peripheral edema or cyanosis. No masses, redness, swelling, asymmetry, or associated skin lesions. No contractures.  Functional ROM:  Unrestricted ROM          Functional ROM: Unrestricted ROM          Muscle Tone/Strength: Functionally intact. No obvious neuro-muscular anomalies detected.  Muscle Tone/Strength: Functionally intact. No obvious neuro-muscular anomalies detected.  Sensory (Neurological): Unimpaired          Sensory (Neurological): Unimpaired          Palpation: No palpable anomalies              Palpation: No palpable anomalies              Provocative Test(s):  Phalen's test: deferred Tinel's test: deferred Apley's scratch test (touch opposite shoulder):  Action 1 (Across chest): deferred Action 2 (Overhead): deferred Action 3 (LB reach): deferred   Provocative Test(s):  Phalen's test: deferred Tinel's test: deferred Apley's scratch test (touch opposite shoulder):  Action 1 (Across chest): deferred Action 2 (Overhead): deferred Action 3 (LB reach): deferred    Thoracic Spine Area Exam  Skin & Axial Inspection: No masses, redness, or swelling Alignment: Symmetrical Functional ROM: Unrestricted ROM Stability: No instability detected Muscle Tone/Strength: Functionally intact. No obvious neuro-muscular anomalies detected. Sensory (Neurological): Unimpaired Muscle strength & Tone: No palpable anomalies  Lumbar Spine Area Exam  Skin & Axial Inspection: No masses, redness, or swelling Alignment: Symmetrical Functional ROM: Decreased ROM       Stability: No instability detected Muscle Tone/Strength: Functionally intact. No obvious neuro-muscular anomalies detected. Sensory (Neurological): Musculoskeletal pain pattern and dermatomal Palpation: Complains of area being tender to palpation Bilateral Fist Percussion Test Provocative Tests: Lumbar Hyperextension/rotation test: (+) bilaterally for facet joint pain. Lumbar quadrant test (Kemp's test): (+) due to pain, radicular pain Lumbar Lateral bending test: (+) due to pain. Patrick's Maneuver: deferred today                   FABER test: deferred  today       Thigh-thrust test: deferred today       S-I  compression test: deferred today       S-I distraction test: deferred today        Gait & Posture Assessment  Ambulation: Limited Gait: Limited. Using assistive device to ambulate Posture: Thoracic kyphosis   Lower Extremity Exam    Side: Right lower extremity  Side: Left lower extremity  Stability: No instability observed          Stability: No instability observed          Skin & Extremity Inspection: Skin color, temperature, and hair growth are WNL. No peripheral edema or cyanosis. No masses, redness, swelling, asymmetry, or associated skin lesions. No contractures.  Skin & Extremity Inspection: Skin color, temperature, and hair growth are WNL. No peripheral edema or cyanosis. No masses, redness, swelling, asymmetry, or associated skin lesions. No contractures.  Functional ROM: Unrestricted ROM                  Functional ROM: Unrestricted ROM                  Muscle Tone/Strength: Functionally intact. No obvious neuro-muscular anomalies detected.  Muscle Tone/Strength: Functionally intact. No obvious neuro-muscular anomalies detected.  Sensory (Neurological): Unimpaired  Sensory (Neurological): Unimpaired  Palpation: No palpable anomalies  Palpation: No palpable anomalies    Assessment  Primary Diagnosis & Pertinent Problem List: The primary encounter diagnosis was Lumbar radiculopathy. Diagnoses of Balance problem, Lumbar degenerative disc disease, Chronic pain syndrome, and Spinal stenosis, lumbar region, with neurogenic claudication were also pertinent to this visit.  Status Diagnosis  Worsening Worsening Worsening 1. Lumbar radiculopathy   2. Balance problem   3. Lumbar degenerative disc disease   4. Chronic pain syndrome   5. Spinal stenosis, lumbar region, with neurogenic claudication      General Recommendations: The pain condition that the patient suffers from is best treated with a multidisciplinary  approach that involves an increase in physical activity to prevent de-conditioning and worsening of the pain cycle, as well as psychological counseling (formal and/or informal) to address the co-morbid psychological affects of pain. Treatment will often involve judicious use of pain medications and interventional procedures to decrease the pain, allowing the patient to participate in the physical activity that will ultimately produce long-lasting pain reductions. The goal of the multidisciplinary approach is to return the patient to a higher level of overall function and to restore their ability to perform activities of daily living.  77 year old female with a history of axial low back pain secondary to lumbar radiculopathy, spinal and foraminal stenosis, spondylosis, degenerative disc disease, SI joint arthritis.  Patient is status post diagnostic lumbar facet medial branch nerve blocks at L3, L4, L5 along with bilateral SI joint injections which were not significantly helpful in her pain symptoms.  Patient is endorsing lateral thigh proximal buttock as well as groin pain.  Patient does have moderate to severe spinal stenosis in her lower lumbar spine along with foraminal stenosis at L3, 4, 5.  We discussed lumbar epidural steroid injection.  Risks and benefits were reviewed and patient would like to proceed.    Patient is also here for refill of her hydrocodone which is her chronic pain medication.  She did not find any significant benefit with a Medrol Dosepak that was prescribed at her last visit on 08/31/2017.  The patient is also complaining of balance issues and states that her balance is gotten worse.  She denies dizziness, changes in vision, loss of consciousness.  Although the patient is on  antihypertensive medications, her blood pressure is usually elevated so orthostatic hypotension is less likely in my opinion.  Her CT head in October 2018 showed mild periventricular and subcortical chronic small  vessel ischemic disease along with a right vertebral artery calcification.  These findings were relayed to the patient.  Patient is requesting a referral to neurology which I think is reasonable and further work-up of her balance issues.  Plan: -Refill hydrocodone as below -Plan for left L2/3 epidural steroid injection under fluoroscopy. -Referral to neurology for balance issues  Plan of Care  Pharmacotherapy (Medications Ordered): Meds ordered this encounter  Medications  . DISCONTD: HYDROcodone-acetaminophen (NORCO) 10-325 MG tablet    Sig: Take 1 tablet by mouth every 4 (four) hours as needed for severe pain. For severe breakthrough pain. Take only as needed Rx to last for 30 days    Dispense:  180 tablet    Refill:  0    Do not place this medication, or any other prescription from our practice, on "Automatic Refill". Patient may have prescription filled one day early if pharmacy is closed on scheduled refill date.  For chronic pain To fill on or after: 09/28/17, 10/28/17  . HYDROcodone-acetaminophen (NORCO) 10-325 MG tablet    Sig: Take 1 tablet by mouth every 4 (four) hours as needed for severe pain. For severe breakthrough pain. Take only as needed Rx to last for 30 days    Dispense:  180 tablet    Refill:  0    Do not place this medication, or any other prescription from our practice, on "Automatic Refill". Patient may have prescription filled one day early if pharmacy is closed on scheduled refill date.  For chronic pain To fill on or after: 09/28/17, 10/28/17   Lab-work, procedure(s), and/or referral(s): Orders Placed This Encounter  Procedures  . Lumbar Epidural Injection  . Ambulatory referral to Neurology   Time Note: Greater than 50% of the 25 minute(s) of face-to-face time spent with Ms. Mitchner, was spent in counseling/coordination of care regarding: the appropriate use of the pain scale, Ms. Padron primary cause of pain, the results of her recent test(s), the significance  of each one oth the test(s) anomalies and it's corresponding characteristic pain pattern(s), the treatment plan, treatment alternatives, the risks and possible complications of proposed treatment, the appropriate use of her medications, realistic expectations, the goals of pain management (increased in functionality), the medication agreement and the patient's responsibilities when it comes to controlled substances.  Provider-requested follow-up: Return in about 2 weeks (around 10/11/2017).  Future Appointments  Date Time Provider [place]  10/09/2017 11:45 AM [name], MD [facility] None  11/23/2017  1:45 PM [name], MD [facility] None  11/28/2017  7:45 AM [facility] DEVICE REMOTES [facility]  06/13/2018  3:00 PM O'Brien-Blaney, Bryson Corona, LPN [facility]    Primary Care Physician: [name], MD Location: [facility] Note by: [name], M.D Date: 09/27/2017; Time: 4:30 PM  Patient Instructions  You have received today Rx for hydrocodone - apap 10-325 x 2 months to begin filling on 09/28/17, 10/28/17.   General Risks and Possible Complications  Patient Responsibilities: It is important that you read this as it is part of your informed consent. It is our duty to inform you of the risks and possible complications associated with treatments offered to you. It is your responsibility as a patient to read this and to ask questions about anything that is not clear or that you  believe was not covered in this document.  Patient's Rights: You have the right to refuse treatment. You also have the right to change your mind, even after initially having agreed to have the treatment done. However, under this last option, if you wait until the last second to change your mind, you may be charged for the materials used up to that point.  Introduction: Medicine is not an Chief Strategy Officer. Everything in Medicine, including the lack of treatment(s),  carries the potential for danger, harm, or loss (which is by definition: Risk). In Medicine, a complication is a secondary problem, condition, or disease that can aggravate an already existing one. All treatments carry the risk of possible complications. The fact that a side effects or complications occurs, does not imply that the treatment was conducted incorrectly. It must be clearly understood that these can happen even when everything is done following the highest safety standards.  No treatment: You can choose not to proceed with the proposed treatment alternative. The "PRO(s)" would include: avoiding the risk of complications associated with the therapy. The "CON(s)" would include: not getting any of the treatment benefits. These benefits fall under one of three categories: diagnostic; therapeutic; and/or palliative. Diagnostic benefits include: getting information which can ultimately lead to improvement of the disease or symptom(s). Therapeutic benefits are those associated with the successful treatment of the disease. Finally, palliative benefits are those related to the decrease of the primary symptoms, without necessarily curing the condition (example: decreasing the pain from a flare-up of a chronic condition, such as incurable terminal cancer).  General Risks and Complications: These are associated to most interventional treatments. They can occur alone, or in combination. They fall under one of the following six (6) categories: no benefit or worsening of symptoms; bleeding; infection; nerve damage; allergic reactions; and/or death. 1. No benefits or worsening of symptoms: In Medicine there are no guarantees, only probabilities. No healthcare provider can ever guarantee that a medical treatment will work, they can only state the probability that it may. Furthermore, there is always the possibility that the condition may worsen, either directly, or indirectly, as a consequence of the  treatment. 2. Bleeding: This is more common if the patient is taking a blood thinner, either prescription or over the counter (example: Goody Powders, Fish oil, Aspirin, Garlic, etc.), or if suffering a condition associated with impaired coagulation (example: Hemophilia, cirrhosis of the liver, low platelet counts, etc.). However, even if you do not have one on these, it can still happen. If you have any of these conditions, or take one of these drugs, make sure to notify your treating physician. 3. Infection: This is more common in patients with a compromised immune system, either due to disease (example: diabetes, cancer, human immunodeficiency virus [HIV], etc.), or due to medications or treatments (example: therapies used to treat cancer and rheumatological diseases). However, even if you do not have one on these, it can still happen. If you have any of these conditions, or take one of these drugs, make sure to notify your treating physician. 4. Nerve Damage: This is more common when the treatment is an invasive one, but it can also happen with the use of medications, such as those used in the treatment of cancer. The damage can occur to small secondary nerves, or to large primary ones, such as those in the spinal cord and brain. This damage may be temporary or permanent and it may lead to impairments that can range from temporary numbness to  permanent paralysis and/or brain death. 5. Allergic Reactions: Any time a substance or material comes in contact with our body, there is the possibility of an allergic reaction. These can range from a mild skin rash (contact dermatitis) to a severe systemic reaction (anaphylactic reaction), which can result in death. 6. Death: In general, any medical intervention can result in death, most of the time due to an unforeseen complication. ____________________________________________________________________________________________  Epidural Steroid Injection Patient  Information  Description: The epidural space surrounds the nerves as they exit the spinal cord.  In some patients, the nerves can be compressed and inflamed by a bulging disc or a tight spinal canal (spinal stenosis).  By injecting steroids into the epidural space, we can bring irritated nerves into direct contact with a potentially helpful medication.  These steroids act directly on the irritated nerves and can reduce swelling and inflammation which often leads to decreased pain.  Epidural steroids may be injected anywhere along the spine and from the neck to the low back depending upon the location of your pain.   After numbing the skin with local anesthetic (like Novocaine), a small needle is passed into the epidural space slowly.  You may experience a sensation of pressure while this is being done.  The entire block usually last less than 10 minutes.  Conditions which may be treated by epidural steroids:   Low back and leg pain  Neck and arm pain  Spinal stenosis  Post-laminectomy syndrome  Herpes zoster (shingles) pain  Pain from compression fractures  Preparation for the injection:  1. Do not eat any solid food or dairy products within 8 hours of your appointment.  2. You may drink clear liquids up to 3 hours before appointment.  Clear liquids include water, black coffee, juice or soda.  No milk or cream please. 3. You may take your regular medication, including pain medications, with a sip of water before your appointment  Diabetics should hold regular insulin (if taken separately) and take 1/2 normal NPH dos the morning of the procedure.  Carry some sugar containing items with you to your appointment. 4. A driver must accompany you and be prepared to drive you home after your procedure.  5. Bring all your current medications with your. 6. An IV may be inserted and sedation may be given at the discretion of the physician.   7. A blood pressure cuff, EKG and other monitors will  often be applied during the procedure.  Some patients may need to have extra oxygen administered for a short period. 8. You will be asked to provide medical information, including your allergies, prior to the procedure.  We must know immediately if you are taking blood thinners (like Coumadin/Warfarin)  Or if you are allergic to IV iodine contrast (dye). We must know if you could possible be pregnant.  Possible side-effects:  Bleeding from needle site  Infection (rare, may require surgery)  Nerve injury (rare)  Numbness & tingling (temporary)  Difficulty urinating (rare, temporary)  Spinal headache ( a headache worse with upright posture)  Light -headedness (temporary)  Pain at injection site (several days)  Decreased blood pressure (temporary)  Weakness in arm/leg (temporary)  Pressure sensation in back/neck (temporary)  Call if you experience:  Fever/chills associated with headache or increased back/neck pain.  Headache worsened by an upright position.  New onset weakness or numbness of an extremity below the injection site  Hives or difficulty breathing (go to the emergency room)  Inflammation or drainage at  the infection site  Severe back/neck pain  Any new symptoms which are concerning to you  Please note:  Although the local anesthetic injected can often make your back or neck feel good for several hours after the injection, the pain will likely return.  It takes 3-7 days for steroids to work in the epidural space.  You may not notice any pain relief for at least that one week.  If effective, we will often do a series of three injections spaced 3-6 weeks apart to maximally decrease your pain.  After the initial series, we generally will wait several months before considering a repeat injection of the same type.  If you have any questions, please call [phone] [facility]

## 2017-09-27 NOTE — Patient Instructions (Signed)
You have received today Rx for hydrocodone - apap 10-325 x 2 months to begin filling on 09/28/17, 10/28/17.   General Risks and Possible Complications  Patient Responsibilities: It is important that you read this as it is part of your informed consent. It is our duty to inform you of the risks and possible complications associated with treatments offered to you. It is your responsibility as a patient to read this and to ask questions about anything that is not clear or that you believe was not covered in this document.  Patient's Rights: You have the right to refuse treatment. You also have the right to change your mind, even after initially having agreed to have the treatment done. However, under this last option, if you wait until the last second to change your mind, you may be charged for the materials used up to that point.  Introduction: Medicine is not an Chief Strategy Officer. Everything in Medicine, including the lack of treatment(s), carries the potential for danger, harm, or loss (which is by definition: Risk). In Medicine, a complication is a secondary problem, condition, or disease that can aggravate an already existing one. All treatments carry the risk of possible complications. The fact that a side effects or complications occurs, does not imply that the treatment was conducted incorrectly. It must be clearly understood that these can happen even when everything is done following the highest safety standards.  No treatment: You can choose not to proceed with the proposed treatment alternative. The "PRO(s)" would include: avoiding the risk of complications associated with the therapy. The "CON(s)" would include: not getting any of the treatment benefits. These benefits fall under one of three categories: diagnostic; therapeutic; and/or palliative. Diagnostic benefits include: getting information which can ultimately lead to improvement of the disease or symptom(s). Therapeutic benefits are those  associated with the successful treatment of the disease. Finally, palliative benefits are those related to the decrease of the primary symptoms, without necessarily curing the condition (example: decreasing the pain from a flare-up of a chronic condition, such as incurable terminal cancer).  General Risks and Complications: These are associated to most interventional treatments. They can occur alone, or in combination. They fall under one of the following six (6) categories: no benefit or worsening of symptoms; bleeding; infection; nerve damage; allergic reactions; and/or death. 1. No benefits or worsening of symptoms: In Medicine there are no guarantees, only probabilities. No healthcare provider can ever guarantee that a medical treatment will work, they can only state the probability that it may. Furthermore, there is always the possibility that the condition may worsen, either directly, or indirectly, as a consequence of the treatment. 2. Bleeding: This is more common if the patient is taking a blood thinner, either prescription or over the counter (example: Goody Powders, Fish oil, Aspirin, Garlic, etc.), or if suffering a condition associated with impaired coagulation (example: Hemophilia, cirrhosis of the liver, low platelet counts, etc.). However, even if you do not have one on these, it can still happen. If you have any of these conditions, or take one of these drugs, make sure to notify your treating physician. 3. Infection: This is more common in patients with a compromised immune system, either due to disease (example: diabetes, cancer, human immunodeficiency virus [HIV], etc.), or due to medications or treatments (example: therapies used to treat cancer and rheumatological diseases). However, even if you do not have one on these, it can still happen. If you have any of these conditions, or take one  of these drugs, make sure to notify your treating physician. 4. Nerve Damage: This is more common  when the treatment is an invasive one, but it can also happen with the use of medications, such as those used in the treatment of cancer. The damage can occur to small secondary nerves, or to large primary ones, such as those in the spinal cord and brain. This damage may be temporary or permanent and it may lead to impairments that can range from temporary numbness to permanent paralysis and/or brain death. 5. Allergic Reactions: Any time a substance or material comes in contact with our body, there is the possibility of an allergic reaction. These can range from a mild skin rash (contact dermatitis) to a severe systemic reaction (anaphylactic reaction), which can result in death. 6. Death: In general, any medical intervention can result in death, most of the time due to an unforeseen complication. ____________________________________________________________________________________________  Epidural Steroid Injection Patient Information  Description: The epidural space surrounds the nerves as they exit the spinal cord.  In some patients, the nerves can be compressed and inflamed by a bulging disc or a tight spinal canal (spinal stenosis).  By injecting steroids into the epidural space, we can bring irritated nerves into direct contact with a potentially helpful medication.  These steroids act directly on the irritated nerves and can reduce swelling and inflammation which often leads to decreased pain.  Epidural steroids may be injected anywhere along the spine and from the neck to the low back depending upon the location of your pain.   After numbing the skin with local anesthetic (like Novocaine), a small needle is passed into the epidural space slowly.  You may experience a sensation of pressure while this is being done.  The entire block usually last less than 10 minutes.  Conditions which may be treated by epidural steroids:   Low back and leg pain  Neck and arm pain  Spinal  stenosis  Post-laminectomy syndrome  Herpes zoster (shingles) pain  Pain from compression fractures  Preparation for the injection:  1. Do not eat any solid food or dairy products within 8 hours of your appointment.  2. You may drink clear liquids up to 3 hours before appointment.  Clear liquids include water, black coffee, juice or soda.  No milk or cream please. 3. You may take your regular medication, including pain medications, with a sip of water before your appointment  Diabetics should hold regular insulin (if taken separately) and take 1/2 normal NPH dos the morning of the procedure.  Carry some sugar containing items with you to your appointment. 4. A driver must accompany you and be prepared to drive you home after your procedure.  5. Bring all your current medications with your. 6. An IV may be inserted and sedation may be given at the discretion of the physician.   7. A blood pressure cuff, EKG and other monitors will often be applied during the procedure.  Some patients may need to have extra oxygen administered for a short period. 8. You will be asked to provide medical information, including your allergies, prior to the procedure.  We must know immediately if you are taking blood thinners (like Coumadin/Warfarin)  Or if you are allergic to IV iodine contrast (dye). We must know if you could possible be pregnant.  Possible side-effects:  Bleeding from needle site  Infection (rare, may require surgery)  Nerve injury (rare)  Numbness & tingling (temporary)  Difficulty urinating (rare, temporary)  Spinal headache ( a headache worse with upright posture)  Light -headedness (temporary)  Pain at injection site (several days)  Decreased blood pressure (temporary)  Weakness in arm/leg (temporary)  Pressure sensation in back/neck (temporary)  Call if you experience:  Fever/chills associated with headache or increased back/neck pain.  Headache worsened by an upright  position.  New onset weakness or numbness of an extremity below the injection site  Hives or difficulty breathing (go to the emergency room)  Inflammation or drainage at the infection site  Severe back/neck pain  Any new symptoms which are concerning to you  Please note:  Although the local anesthetic injected can often make your back or neck feel good for several hours after the injection, the pain will likely return.  It takes 3-7 days for steroids to work in the epidural space.  You may not notice any pain relief for at least that one week.  If effective, we will often do a series of three injections spaced 3-6 weeks apart to maximally decrease your pain.  After the initial series, we generally will wait several months before considering a repeat injection of the same type.  If you have any questions, please call [phone] [facility]

## 2017-09-27 NOTE — Progress Notes (Signed)
Nursing Pain Medication Assessment:  Safety precautions to be maintained throughout the outpatient stay will include: orient to surroundings, keep bed in low position, maintain call bell within reach at all times, provide assistance with transfer out of bed and ambulation.  Medication Inspection Compliance: Pill count conducted under aseptic conditions, in front of the patient. Neither the pills nor the bottle was removed from the patient's sight at any time. Once count was completed pills were immediately returned to the patient in their original bottle.  Medication: Hydrocodone/APAP Pill/Patch Count: 18 of 180 pills remain Pill/Patch Appearance: Markings consistent with prescribed medication Bottle Appearance: Standard pharmacy container. Clearly labeled. Filled Date: 06 / 13 / 2019 Last Medication intake:  Today

## 2017-09-29 ENCOUNTER — Other Ambulatory Visit: Payer: Self-pay | Admitting: Internal Medicine

## 2017-10-03 DIAGNOSIS — C672 Malignant neoplasm of lateral wall of bladder: Secondary | ICD-10-CM | POA: Diagnosis not present

## 2017-10-09 ENCOUNTER — Ambulatory Visit
Admission: RE | Admit: 2017-10-09 | Discharge: 2017-10-09 | Disposition: A | Discharge status: Home / Self Care | Payer: Medicare Other | Source: Ambulatory Visit | Attending: Student in an Organized Health Care Education/Training Program | Admitting: Student in an Organized Health Care Education/Training Program

## 2017-10-09 ENCOUNTER — Encounter: Payer: Self-pay | Admitting: Student in an Organized Health Care Education/Training Program

## 2017-10-09 ENCOUNTER — Other Ambulatory Visit: Payer: Self-pay

## 2017-10-09 ENCOUNTER — Ambulatory Visit (HOSPITAL_BASED_OUTPATIENT_CLINIC_OR_DEPARTMENT_OTHER): Payer: Medicare Other | Admitting: Student in an Organized Health Care Education/Training Program

## 2017-10-09 VITALS — BP 144/61 | HR 62 | Temp 98.2°F | Resp 13 | Ht 68.0 in | Wt 125.0 lb

## 2017-10-09 DIAGNOSIS — M545 Low back pain: Secondary | ICD-10-CM | POA: Insufficient documentation

## 2017-10-09 DIAGNOSIS — M5416 Radiculopathy, lumbar region: Secondary | ICD-10-CM | POA: Diagnosis not present

## 2017-10-09 MED ORDER — LIDOCAINE HCL 2 % IJ SOLN
10.0000 mL | Freq: Once | INTRAMUSCULAR | Status: AC
  Administered 2017-10-09: 200 mg
  Filled 2017-10-09: qty 40

## 2017-10-09 MED ORDER — LACTATED RINGERS IV SOLN: 1000.0000 mL | Freq: Once | INTRAVENOUS | Status: DC

## 2017-10-09 MED ORDER — IOPAMIDOL (ISOVUE-M 200) INJECTION 41%
10.0000 mL | Freq: Once | INTRAMUSCULAR | Status: AC
  Administered 2017-10-09: 10 mL via EPIDURAL
  Filled 2017-10-09: qty 10

## 2017-10-09 MED ORDER — ROPIVACAINE HCL 2 MG/ML IJ SOLN
2.0000 mL | Freq: Once | INTRAMUSCULAR | Status: AC
  Administered 2017-10-09: 2 mL via EPIDURAL
  Filled 2017-10-09: qty 10

## 2017-10-09 MED ORDER — FENTANYL CITRATE (PF) 100 MCG/2ML IJ SOLN: 25.0000 ug | INTRAMUSCULAR | Status: DC | PRN

## 2017-10-09 MED ORDER — DEXAMETHASONE SODIUM PHOSPHATE 10 MG/ML IJ SOLN
10.0000 mg | Freq: Once | INTRAMUSCULAR | Status: AC
  Administered 2017-10-09: 10 mg
  Filled 2017-10-09: qty 1

## 2017-10-09 MED ORDER — SODIUM CHLORIDE 0.9% FLUSH
2.0000 mL | Freq: Once | INTRAVENOUS | Status: AC
  Administered 2017-10-09: 10 mL

## 2017-10-09 NOTE — Progress Notes (Signed)
Patient's Name: [name]  MRN: [id number]  Referring Provider: [name], MD  DOB: Dec 03, 1940  PCP: [name], MD  DOS: 10/09/2017  Note by: [name], MD  Service setting: Ambulatory outpatient  Specialty: Interventional Pain Management  Patient type: Established  Location: [facility] (AMB) Pain Management Facility  Visit type: Interventional Procedure   Primary Reason for Visit: Interventional Pain Management Treatment. CC: Back Pain (low left)  Procedure:          Anesthesia, Analgesia, Anxiolysis:  Type: Therapeutic Inter-Laminar Epidural Steroid Injection #1  Region: Lumbar Level: L5-S1 Level. Laterality: Left         Type: Local Anesthesia Indication(s): Analgesia         Route: Infiltration (Spivey/IM) IV Access: Secured Sedation: Declined  Local Anesthetic: Lidocaine 2%   Indications: 1. Lumbar radiculopathy    Pain Score: Pre-procedure: 8 /10 Post-procedure: 0-No pain/10  Pre-op Assessment:  [name] is a 77 y.o. (year old), female patient, seen today for interventional treatment. 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 [name] has a current medication list which includes the following prescription(s): amlodipine, gabapentin, hydrocodone-acetaminophen, lisinopril, metoprolol succinate, pantoprazole, polyethylene glycol, promethazine, and sucralfate, and the following Facility-Administered Medications: epirubicin (ELLENCE) 50 mg in sodium chloride 0.9 % bladder instillation, fentanyl, and lactated ringers. Her primarily concern today is the Back Pain (low left)  Initial Vital Signs:  Pulse/HCG Rate: 66  Temp: 98.2 F (36.8 C) Resp: 16 BP: (!) 160/80 SpO2: 98 %  BMI: Estimated body mass index is 19.01 kg/m as calculated from the following:   Height as of this encounter: 5\' 8"  (1.727 m).   Weight as of this encounter: 125 lb (56.7 kg).  Risk Assessment: Allergies: Reviewed. She has No Known Allergies.  Allergy Precautions: None required Coagulopathies: Reviewed. None identified.  Blood-thinner therapy: None at this time Active Infection(s): Reviewed. None identified. [name] is afebrile  Site Confirmation: [name] was asked to confirm the procedure and laterality before marking the site Procedure checklist: Completed Consent: Before the procedure and under the influence of no sedative(s), amnesic(s), or anxiolytics, the patient was informed of the treatment options, risks and possible complications. To fulfill our ethical and legal obligations, as recommended by the American Medical Association's Code of Ethics, I have informed the patient of my clinical impression; the nature and purpose of the treatment or procedure; the risks, benefits, and possible complications of the intervention; the alternatives, including doing nothing; the risk(s) and benefit(s) of the alternative treatment(s) or procedure(s); and the risk(s) and benefit(s) of doing nothing. The patient was provided information about the general risks and possible complications associated with the procedure. These may include, but are  not limited to: failure to achieve desired goals, infection, bleeding, organ or nerve damage, allergic reactions, paralysis, and death. In addition, the patient was informed of those risks and complications associated to [facility]  procedures, such as failure to decrease pain; infection (i.e.: Meningitis, epidural or intraspinal abscess); bleeding (i.e.: epidural hematoma, subarachnoid hemorrhage, or any other type of intraspinal or peri-dural bleeding); organ or nerve damage (i.e.: Any type of peripheral nerve, nerve root, or spinal cord injury) with subsequent damage to sensory, motor, and/or autonomic systems, resulting in permanent pain, numbness, and/or weakness of one or several areas of the body; allergic reactions; (i.e.: anaphylactic reaction); and/or death. Furthermore, the patient was informed of those risks and complications associated with the medications. These include, but are not limited to: allergic reactions (i.e.: anaphylactic or anaphylactoid reaction(s)); adrenal axis suppression; blood sugar elevation that in diabetics may result in ketoacidosis or comma; water retention that in patients with history of congestive heart failure may result in shortness of breath, pulmonary edema, and decompensation with resultant heart failure; weight gain; swelling or edema; medication-induced neural toxicity; particulate matter embolism and blood vessel occlusion with resultant organ, and/or nervous system infarction; and/or aseptic necrosis of one or more joints. Finally, the patient was informed that Medicine is not an exact science; therefore, there is also the possibility of unforeseen or unpredictable risks and/or possible complications that may result in a catastrophic outcome. The patient indicated having understood very clearly. We have given the patient no guarantees and we have made no promises. Enough time was given to the patient to ask questions, all of which were answered to the patient's  satisfaction. [name] has indicated that she wanted to continue with the procedure. Attestation: I, the ordering provider, attest that I have discussed with the patient the benefits, risks, side-effects, alternatives, likelihood of achieving goals, and potential problems during recovery for the procedure that I have provided informed consent. Date  Time: 10/09/2017 11:41 AM  Pre-Procedure Preparation:  Monitoring: As per clinic protocol. Respiration, ETCO2, SpO2, BP, heart rate and rhythm monitor placed and checked for adequate function Safety Precautions: Patient was assessed for positional comfort and pressure points before starting the procedure. Time-out: I initiated and conducted the "Time-out" before starting the procedure, as per protocol. The patient was asked to participate by confirming the accuracy of the "Time Out" information. Verification of the correct person, site, and procedure were performed and confirmed by me, the nursing staff, and the patient. "Time-out" conducted as per Joint Commission's Universal Protocol (UP.01.01.01). Time: 1211  Description of Procedure:          Position: Prone with head of the table was raised to facilitate breathing. Target Area: The interlaminar space, initially targeting the lower laminar border of the superior vertebral body. Approach: Paramedial approach. Area Prepped: Entire Posterior Lumbar Region Prepping solution: ChloraPrep (2% chlorhexidine gluconate and 70% isopropyl alcohol) Safety Precautions: Aspiration looking for blood return was conducted prior to all injections. At no point did we inject any substances, as a needle was being advanced. No attempts were made at seeking any paresthesias. Safe injection practices and needle disposal techniques used. Medications properly checked for expiration dates. SDV (single dose vial) medications used. Description of the Procedure: Protocol guidelines were followed. The procedure needle was  introduced through the skin, ipsilateral to the reported pain, and advanced to the target area. Bone was contacted and the needle walked caudad, until the lamina was cleared. The epidural space was identified using "loss-of-resistance technique" with 2-3 ml of PF-NaCl (0.9% NSS), in a 5cc LOR glass syringe. Vitals:   10/09/17 1148 10/09/17 1210 10/09/17 1215 10/09/17 1221  BP:  (!) 151/70 (!) 165/78 (!) 144/61  Pulse:  65 64 62  Resp:  12 12 13   Temp: 98.2 F (36.8 C)     SpO2:  98% 97% 95%  Weight:      Height:        Start Time: 1211 hrs. End Time: 1218 hrs. Materials:  Needle(s) Type: Epidural needle Gauge: 17G Length: 3.5-in Medication(s): Please see orders for medications and dosing details. 9 cc solution made of 6 cc of preservative-free saline, 2 cc of 0.2% ropivacaine, 1 cc of Decadron 10 mg/cc Imaging Guidance (Spinal):          Type of Imaging Technique: Fluoroscopy Guidance (Spinal) Indication(s): Assistance in needle guidance and placement for procedures requiring needle placement in or near specific anatomical locations not easily accessible without such assistance. Exposure Time: Please see nurses notes. Contrast: Before injecting any contrast, we confirmed that the patient did not have an allergy to iodine, shellfish, or radiological contrast. Once satisfactory needle placement was completed at the desired level, radiological contrast was injected. Contrast injected under live fluoroscopy. No contrast complications. See chart for type and volume of contrast used. Fluoroscopic Guidance: I was personally present during the use of fluoroscopy. "Tunnel Vision Technique" used to obtain the best possible view of the target area. Parallax error corrected before commencing the procedure. "Direction-depth-direction" technique used to introduce the needle under continuous pulsed fluoroscopy. Once target was reached, antero-posterior, oblique, and lateral fluoroscopic projection used  confirm needle placement in all planes. Images permanently stored in EMR. Interpretation: I personally interpreted the imaging intraoperatively. Adequate needle placement confirmed in multiple planes. Appropriate spread of contrast into desired area was observed. No evidence of afferent or efferent intravascular uptake. No intrathecal or subarachnoid spread observed. Permanent images saved into the patient's record.  Antibiotic Prophylaxis:   Anti-infectives (From admission, onward)   None     Indication(s): None identified  Post-operative Assessment:  Post-procedure Vital Signs:  Pulse/HCG Rate: 62  Temp: 98.2 F (36.8 C) Resp: 13 BP: (!) 144/61 SpO2: 95 %  EBL: None  Complications: No immediate post-treatment complications observed by team, or reported by patient.  Note: The patient tolerated the entire procedure well. A repeat set of vitals were taken after the procedure and the patient was kept under observation following institutional policy, for this type of procedure. Post-procedural neurological assessment was performed, showing return to baseline, prior to discharge. The patient was provided with post-procedure discharge instructions, including a section on how to identify potential problems. Should any problems arise concerning this procedure, the patient was given instructions to immediately contact us, at any time, without hesitation. In any case, we plan to contact the patient by telephone for a follow-up status report regarding this interventional procedure.  Comments:  No additional relevant information. 5 out of 5 strength bilateral lower extremity: Plantar flexion, dorsiflexion, knee flexion, knee extension.  Plan of Care    Imaging Orders     DG C-Arm 1-60 Min-No Report Procedure Orders    No procedure(s) ordered today    Medications ordered for procedure: Meds ordered this encounter  Medications  . lactated ringers infusion 1,000 mL  . fentaNYL (SUBLIMAZE)  injection 25-100 mcg    Make sure Narcan is available in the pyxis when using this medication. In the event of respiratory depression (RR< 8/min): Titrate NARCAN (naloxone) in increments of 0.1 to 0.2 mg IV at 2-3 minute intervals, until desired degree of reversal.  . iopamidol (ISOVUE-M) 41 % intrathecal injection 10 mL  . ropivacaine (PF) 2 mg/mL (0.2%) (NAROPIN)  injection 2 mL  . sodium chloride flush (NS) 0.9 % injection 2 mL  . lidocaine (XYLOCAINE) 2 % (with pres) injection 200 mg  . dexamethasone (DECADRON) injection 10 mg   Medications administered: We administered iopamidol, ropivacaine (PF) 2 mg/mL (0.2%), sodium chloride flush, lidocaine, and dexamethasone.  See the medical record for exact dosing, route, and time of administration.  New Prescriptions   No medications on file   Disposition: Discharge home  Discharge Date & Time: 10/09/2017; 1223 hrs.   Physician-requested Follow-up: Keep original appointment in September as below  Future Appointments  Date Time Provider Diagonal  11/23/2017  1:45 PM [name], MD [facility] None  11/28/2017  7:45 AM [facility] DEVICE REMOTES [facility]  06/13/2018  3:00 PM O'Brien-Blaney, Bryson Corona, LPN [facility]   Primary Care Physician: [name], MD Location: [facility] Note by: [name], MD Date: 10/09/2017; Time: 12:36 PM  Disclaimer:  Medicine is not an exact science. The only guarantee in medicine is that nothing is guaranteed. It is important to note that the decision to proceed with this intervention was based on the information collected from the patient. The Data and conclusions were drawn from the patient's questionnaire, the interview, and the physical examination. Because the information was provided in large part by the patient, it cannot be guaranteed that it has not been purposely or unconsciously manipulated. Every effort has been made to obtain as much  relevant data as possible for this evaluation. It is important to note that the conclusions that lead to this procedure are derived in large part from the available data. Always take into account that the treatment will also be dependent on availability of resources and existing treatment guidelines, considered by other Pain Management Practitioners as being common knowledge and practice, at the time of the intervention. For Medico-Legal purposes, it is also important to point out that variation in procedural techniques and pharmacological choices are the acceptable norm. The indications, contraindications, technique, and results of the above procedure should only be interpreted and judged by a Board-Certified Interventional Pain Specialist with extensive familiarity and expertise in the same exact procedure and technique.

## 2017-10-09 NOTE — Progress Notes (Signed)
Safety precautions to be maintained throughout the outpatient stay will include: orient to surroundings, keep bed in low position, maintain call bell within reach at all times, provide assistance with transfer out of bed and ambulation.  

## 2017-10-10 ENCOUNTER — Telehealth: Payer: Self-pay | Admitting: *Deleted

## 2017-10-10 NOTE — Telephone Encounter (Signed)
Voicemail left with patient to call our office if there are questions or concerns re; procedure on yesterday.  

## 2017-10-27 DIAGNOSIS — Z5111 Encounter for antineoplastic chemotherapy: Secondary | ICD-10-CM | POA: Diagnosis not present

## 2017-10-27 DIAGNOSIS — C678 Malignant neoplasm of overlapping sites of bladder: Secondary | ICD-10-CM | POA: Diagnosis not present

## 2017-10-31 ENCOUNTER — Other Ambulatory Visit: Payer: Self-pay | Admitting: Internal Medicine

## 2017-11-03 DIAGNOSIS — C678 Malignant neoplasm of overlapping sites of bladder: Secondary | ICD-10-CM | POA: Diagnosis not present

## 2017-11-03 DIAGNOSIS — Z5111 Encounter for antineoplastic chemotherapy: Secondary | ICD-10-CM | POA: Diagnosis not present

## 2017-11-10 DIAGNOSIS — Z5111 Encounter for antineoplastic chemotherapy: Secondary | ICD-10-CM | POA: Diagnosis not present

## 2017-11-10 DIAGNOSIS — C672 Malignant neoplasm of lateral wall of bladder: Secondary | ICD-10-CM | POA: Diagnosis not present

## 2017-11-13 DIAGNOSIS — E559 Vitamin D deficiency, unspecified: Secondary | ICD-10-CM | POA: Diagnosis not present

## 2017-11-13 DIAGNOSIS — E538 Deficiency of other specified B group vitamins: Secondary | ICD-10-CM | POA: Diagnosis not present

## 2017-11-13 DIAGNOSIS — R2689 Other abnormalities of gait and mobility: Secondary | ICD-10-CM | POA: Diagnosis not present

## 2017-11-13 DIAGNOSIS — G629 Polyneuropathy, unspecified: Secondary | ICD-10-CM | POA: Diagnosis not present

## 2017-11-14 ENCOUNTER — Ambulatory Visit
Payer: Medicare Other | Attending: Student in an Organized Health Care Education/Training Program | Admitting: Student in an Organized Health Care Education/Training Program

## 2017-11-14 ENCOUNTER — Encounter: Payer: Self-pay | Admitting: Student in an Organized Health Care Education/Training Program

## 2017-11-14 ENCOUNTER — Other Ambulatory Visit: Payer: Self-pay

## 2017-11-14 VITALS — BP 157/94 | HR 63 | Temp 97.6°F | Resp 18 | Ht 68.0 in | Wt 125.0 lb

## 2017-11-14 DIAGNOSIS — I2510 Atherosclerotic heart disease of native coronary artery without angina pectoris: Secondary | ICD-10-CM | POA: Diagnosis not present

## 2017-11-14 DIAGNOSIS — I428 Other cardiomyopathies: Secondary | ICD-10-CM | POA: Diagnosis not present

## 2017-11-14 DIAGNOSIS — M4802 Spinal stenosis, cervical region: Secondary | ICD-10-CM | POA: Diagnosis not present

## 2017-11-14 DIAGNOSIS — M47816 Spondylosis without myelopathy or radiculopathy, lumbar region: Secondary | ICD-10-CM | POA: Diagnosis not present

## 2017-11-14 DIAGNOSIS — M47898 Other spondylosis, sacral and sacrococcygeal region: Secondary | ICD-10-CM | POA: Diagnosis not present

## 2017-11-14 DIAGNOSIS — Z5181 Encounter for therapeutic drug level monitoring: Secondary | ICD-10-CM | POA: Diagnosis not present

## 2017-11-14 DIAGNOSIS — M4726 Other spondylosis with radiculopathy, lumbar region: Secondary | ICD-10-CM | POA: Insufficient documentation

## 2017-11-14 DIAGNOSIS — Z79899 Other long term (current) drug therapy: Secondary | ICD-10-CM | POA: Insufficient documentation

## 2017-11-14 DIAGNOSIS — M48062 Spinal stenosis, lumbar region with neurogenic claudication: Secondary | ICD-10-CM | POA: Insufficient documentation

## 2017-11-14 DIAGNOSIS — E871 Hypo-osmolality and hyponatremia: Secondary | ICD-10-CM | POA: Diagnosis not present

## 2017-11-14 DIAGNOSIS — Z8551 Personal history of malignant neoplasm of bladder: Secondary | ICD-10-CM | POA: Insufficient documentation

## 2017-11-14 DIAGNOSIS — I447 Left bundle-branch block, unspecified: Secondary | ICD-10-CM | POA: Diagnosis not present

## 2017-11-14 DIAGNOSIS — M5136 Other intervertebral disc degeneration, lumbar region: Secondary | ICD-10-CM | POA: Diagnosis not present

## 2017-11-14 DIAGNOSIS — M25559 Pain in unspecified hip: Secondary | ICD-10-CM | POA: Insufficient documentation

## 2017-11-14 DIAGNOSIS — M4126 Other idiopathic scoliosis, lumbar region: Secondary | ICD-10-CM | POA: Insufficient documentation

## 2017-11-14 DIAGNOSIS — K219 Gastro-esophageal reflux disease without esophagitis: Secondary | ICD-10-CM | POA: Diagnosis not present

## 2017-11-14 DIAGNOSIS — M48061 Spinal stenosis, lumbar region without neurogenic claudication: Secondary | ICD-10-CM | POA: Insufficient documentation

## 2017-11-14 DIAGNOSIS — Z8719 Personal history of other diseases of the digestive system: Secondary | ICD-10-CM | POA: Insufficient documentation

## 2017-11-14 DIAGNOSIS — M5416 Radiculopathy, lumbar region: Secondary | ICD-10-CM | POA: Diagnosis not present

## 2017-11-14 DIAGNOSIS — M47818 Spondylosis without myelopathy or radiculopathy, sacral and sacrococcygeal region: Secondary | ICD-10-CM | POA: Diagnosis not present

## 2017-11-14 DIAGNOSIS — I110 Hypertensive heart disease with heart failure: Secondary | ICD-10-CM | POA: Insufficient documentation

## 2017-11-14 DIAGNOSIS — M545 Low back pain: Secondary | ICD-10-CM | POA: Insufficient documentation

## 2017-11-14 DIAGNOSIS — G894 Chronic pain syndrome: Secondary | ICD-10-CM | POA: Insufficient documentation

## 2017-11-14 DIAGNOSIS — M5135 Other intervertebral disc degeneration, thoracolumbar region: Secondary | ICD-10-CM | POA: Insufficient documentation

## 2017-11-14 DIAGNOSIS — F17210 Nicotine dependence, cigarettes, uncomplicated: Secondary | ICD-10-CM | POA: Diagnosis not present

## 2017-11-14 DIAGNOSIS — M5116 Intervertebral disc disorders with radiculopathy, lumbar region: Secondary | ICD-10-CM | POA: Insufficient documentation

## 2017-11-14 DIAGNOSIS — I5022 Chronic systolic (congestive) heart failure: Secondary | ICD-10-CM | POA: Insufficient documentation

## 2017-11-14 DIAGNOSIS — Z95810 Presence of automatic (implantable) cardiac defibrillator: Secondary | ICD-10-CM | POA: Diagnosis not present

## 2017-11-14 DIAGNOSIS — R2689 Other abnormalities of gait and mobility: Secondary | ICD-10-CM

## 2017-11-14 DIAGNOSIS — D509 Iron deficiency anemia, unspecified: Secondary | ICD-10-CM | POA: Insufficient documentation

## 2017-11-14 DIAGNOSIS — J439 Emphysema, unspecified: Secondary | ICD-10-CM | POA: Diagnosis not present

## 2017-11-14 MED ORDER — PREGABALIN 50 MG PO CAPS: ORAL_CAPSULE | ORAL | 0 refills | Status: DC

## 2017-11-14 MED ORDER — HYDROCODONE-ACETAMINOPHEN 10-325 MG PO TABS: 1.0000 | ORAL_TABLET | ORAL | 0 refills | Status: DC | PRN

## 2017-11-14 NOTE — Progress Notes (Signed)
Patient's Name: [name]  MRN: [id number]  Referring Provider: [name], MD  DOB: 15-Mar-1941  PCP: [name], MD  DOS: 11/14/2017  Note by: [name], MD  Service setting: Ambulatory outpatient  Specialty: Interventional Pain Management  Location: [facility] (AMB) Pain Management Facility    Patient type: Established   Primary Reason(s) for Visit: Encounter for prescription drug management. (Level of risk: moderate)  CC: Back Pain (low) and Hip Pain  HPI  [name] is a 77 y.o. year old, female patient, who comes today for a medication management evaluation. She has Palpitations; Hyponatremia; Bladder cancer ([place]); LBBB (left bundle branch block); Atrial ectopy; Congestive dilated cardiomyopathy ([place]); History of gastric ulcer; Spinal stenosis of thoracolumbar region; COPD (chronic obstructive pulmonary disease) ([place]); Constipation; Spinal stenosis in cervical region; Chronic bilateral low back pain; NSAID induced gastritis; Parkinsonian features; Hypertension; Spinal stenosis of lumbar region without neurogenic claudication; Other idiopathic scoliosis, lumbar region; Hyperlipidemia; S/P placement of cardiac pacemaker; Coronary artery disease; Lumbar spondylosis; SI joint arthritis; and Lumbar degenerative disc disease on their problem list. Her primarily concern today is the Back Pain (low) and Hip Pain  Pain Assessment: Location:   Back Radiating: radiates into left buttock and left hip Onset: More than a month ago Duration: Chronic pain Quality: Constant, Discomfort Severity: 7 /10 (subjective, self-reported pain score)  Note: Reported level is inconsistent with clinical observations. Clinically the patient looks like a 4/10 A 4/10 is viewed as "Moderately Severe" and described as impossible to ignore for more than a few minutes. With effort, patients may still be able to manage work or participate in some social activities. Very difficult to concentrate. Signs of autonomic nervous  system discharge are evident: dilated pupils (mydriasis); mild sweating (diaphoresis); sleep interference. Heart rate becomes elevated (>115 bpm). Diastolic blood pressure (lower number) rises above 100 mmHg. Patients find relief in laying down and not moving. Information on the proper use of the pain scale provided to the patient today. When using our objective Pain Scale, levels between 6 and 10/10 are said to belong in an emergency room, as it progressively worsens from a 6/10, described as severely limiting, requiring emergency care not usually available at an outpatient pain management facility. At a 6/10 level, communication becomes difficult and requires great effort. Assistance to reach the emergency department may be required. Facial flushing and profuse sweating along with potentially dangerous increases in heart rate and blood pressure will be evident. Effect on ADL: "I cant do alot of things" Timing: Constant Modifying factors: meds BP: (!) 157/94  HR: 63  [name] was last scheduled for an appointment on 10/09/2017 for medication management. During today's appointment we reviewed [name] chronic pain status, as well as her outpatient medication regimen.  Patient follows up today for medication management as well as postprocedural evaluation after her left L5-S1 epidural steroid injection performed on 10/09/2017.  Patient states that she had benefit for approximately the first 48 hours but then had return of pain back to baseline after that.  She is very frustrated with her lack of pain control.    The patient  reports that she does not use drugs. Her body mass index is 19.01 kg/m.  Further details on both, my assessment(s), as well as the proposed treatment plan, please see below.  Controlled Substance Pharmacotherapy Assessment REMS (Risk Evaluation and Mitigation Strategy)  Analgesic:Hydrocodone 10 mg every 4 hours as needed, quantity 180 MME/day:35m/day.  TDewayne Shorter RN   11/14/2017  1:50 PM  Signed Nursing Pain Medication Assessment:  Safety precautions to be maintained throughout the outpatient stay will include: orient to surroundings, keep bed in low position, maintain call bell within reach at all times, provide assistance with transfer out of bed and ambulation.  Medication Inspection Compliance: Pill count conducted under aseptic conditions, in front of the patient. Neither the pills nor the bottle was removed from the patient's sight at any time. Once count was completed pills were immediately returned to the patient in their original bottle.  Medication: Hydrocodone/APAP Pill/Patch Count: 82 of 180 pills remain Pill/Patch Appearance: Markings consistent with prescribed medication Bottle Appearance: Standard pharmacy container. Clearly labeled. Filled Date: 08/ 10 / 2019 Last Medication intake:  Today   Pharmacokinetics: Liberation and absorption (onset of action): WNL Distribution (time to peak effect): WNL Metabolism and excretion (duration of action): WNL         Pharmacodynamics: Desired effects: Analgesia: [name] reports <50% benefit. Functional ability: Patient reports that medication does help, but not nearly as much as she would like Clinically meaningful improvement in function (CMI): Sustained CMIF goals met Perceived effectiveness: Described as relatively effective but with some room for improvement Undesirable effects: Side-effects or Adverse reactions: None reported Monitoring: [place] PMP: Online review of the past 36-monthperiod conducted. Compliant with practice rules and regulations Last UDS on record: Summary  Date Value Ref Range Status  12/27/2016 FINAL  Final    Comment:    ==================================================================== TOXAS[facility]RE COMP DRUG ANALYSIS,UR ==================================================================== Test                             Result       Flag       Units Drug Present and  Declared for Prescription Verification   Hydrocodone                    3156         EXPECTED   ng/mg creat   Hydromorphone                  848          EXPECTED   ng/mg creat   Dihydrocodeine                 236          EXPECTED   ng/mg creat   Norhydrocodone                 3780         EXPECTED   ng/mg creat    Sources of hydrocodone include scheduled prescription    medications. Hydromorphone, dihydrocodeine and norhydrocodone are    expected metabolites of hydrocodone. Hydromorphone and    dihydrocodeine are also available as scheduled prescription    medications.   Gabapentin                     PRESENT      EXPECTED   Cyclobenzaprine                PRESENT      EXPECTED   Desmethylcyclobenzaprine       PRESENT      EXPECTED    Desmethylcyclobenzaprine is an expected metabolite of    cyclobenzaprine.   Acetaminophen                  PRESENT      EXPECTED  Metoprolol                     PRESENT      EXPECTED Drug Absent but Declared for Prescription Verification   Duloxetine                     Not Detected UNEXPECTED   Promethazine                   Not Detected UNEXPECTED ==================================================================== Test                      Result    Flag   Units      Ref Range   Creatinine              25               mg/dL      >=20 ==================================================================== Declared Medications:  The flagging and interpretation on this report are based on the  following declared medications.  Unexpected results may arise from  inaccuracies in the declared medications.  **Note: The testing scope of this panel includes these medications:  Cyclobenzaprine (Flexeril)  Duloxetine (Cymbalta)  Gabapentin (Neurontin)  Hydrocodone (Norco)  Metoprolol (Toprol)  Promethazine (Phenergan)  **Note: The testing scope of this panel does not include small to  moderate amounts of these reported medications:  Acetaminophen (Norco)   **Note: The testing scope of this panel does not include following  reported medications:  Amlodipine (Norvasc)  Lisinopril (Prinivil)  Pantoprazole (Protonix)  Polyethylene Glycol (MiraLAX)  Spironolactone (Aldactone)  Sucralfate (Carafate) ==================================================================== For clinical consultation, please call [phone]. ====================================================================    UDS interpretation: Compliant          Medication Assessment Form: Reviewed. Patient indicates being compliant with therapy Treatment compliance: Compliant Risk Assessment Profile: Aberrant behavior: See prior evaluations. None observed or detected today Comorbid factors increasing risk of overdose: See prior notes. No additional risks detected today Opioid risk tool (ORT) (Total Score):   Personal History of Substance Abuse (SUD-Substance use disorder):  Alcohol:    Illegal Drugs:    Rx Drugs:    ORT Risk Level calculation:   Risk of substance use disorder (SUD): Low  ORT Scoring interpretation table:  Score <3 = Low Risk for SUD  Score between 4-7 = Moderate Risk for SUD  Score >8 = High Risk for Opioid Abuse   Risk Mitigation Strategies:  Patient Counseling: Covered Patient-Prescriber Agreement (PPA): Present and active  Notification to other healthcare providers: Done  Pharmacologic Plan: No change in therapy, at this time.             Laboratory Chemistry  Inflammation Markers (CRP: Acute Phase) (ESR: Chronic Phase) No results found for: CRP, ESRSEDRATE, LATICACIDVEN                       Rheumatology Markers No results found for: RF, ANA, LABURIC, URICUR, LYMEIGGIGMAB, LYMEABIGMQN, HLAB27                      Renal Function Markers Lab Results  Component Value Date   BUN 11 03/24/2017   CREATININE 0.73 03/24/2017   BCR 18 04/28/2014   GFRAA >60 10/02/2015   GFRNONAA >60 10/02/2015                             Hepatic Function  Markers Lab Results  Component Value Date   AST 25 03/24/2017   ALT 17 03/24/2017   ALBUMIN 4.4 03/24/2017   ALKPHOS 105 03/24/2017   LIPASE 30 11/23/2013                        Electrolytes Lab Results  Component Value Date   NA 136 06/30/2017   K 4.2 06/30/2017   CL 99 03/24/2017   CALCIUM 9.9 03/24/2017                        Neuropathy Markers Lab Results  Component Value Date   VITAMINB12 1,069 (H) 12/27/2013   HGBA1C 4.6 12/27/2013                        Bone Pathology Markers No results found for: VD25OH, XB147WG9FAO, ZH0865HQ4, ON6295MW4, 25OHVITD1, 25OHVITD2, 25OHVITD3, TESTOFREE, TESTOSTERONE                       Coagulation Parameters Lab Results  Component Value Date   INR 1.0 04/28/2014   LABPROT 10.3 04/28/2014   APTT 27 11/22/2013   PLT 209 10/02/2015                        Cardiovascular Markers Lab Results  Component Value Date   CKTOTAL 87 09/01/2010   CKMB 4.1 (H) 09/01/2010   TROPONINI <0.30 11/23/2013   HGB 13.3 06/30/2017   HCT 39.0 06/30/2017                         CA Markers No results found for: CEA, CA125, LABCA2                      Note: Lab results reviewed.  Recent Diagnostic Imaging Results  DG C-Arm 1-60 Min-No Report Fluoroscopy was utilized by the requesting physician.  No radiographic  interpretation.   Complexity Note: Imaging results reviewed. Results shared with Ms. Frisinger, using Layman's terms.                         Meds   Current Outpatient Medications:  .  amLODipine (NORVASC) 2.5 MG tablet, Take 2.5 mg by mouth daily., Disp: , Rfl:  .  [START ON 11/28/2017] HYDROcodone-acetaminophen (NORCO) 10-325 MG tablet, Take 1 tablet by mouth every 4 (four) hours as needed for severe pain., Disp: 180 tablet, Rfl: 0 .  metoprolol succinate (TOPROL-XL) 25 MG 24 hr tablet, Take 1 tablet (25 mg total) by mouth daily. (Patient taking differently: Take 25 mg by mouth every morning. ), Disp: 30 tablet, Rfl: 10 .  pantoprazole  (PROTONIX) 40 MG tablet, TAKE 1 TABLET(40 MG) BY MOUTH TWICE DAILY, Disp: 180 tablet, Rfl: 1 .  polyethylene glycol (MIRALAX / GLYCOLAX) packet, Take 17 g by mouth daily as needed for mild constipation. , Disp: , Rfl:  .  promethazine (PHENERGAN) 12.5 MG tablet, TAKE 1 TABLET BY MOUTH EVERY 8 HOURS AS NEEDED FOR NAUSEA OR VOMITING, Disp: 60 tablet, Rfl: 0 .  sucralfate (CARAFATE) 1 g tablet, TAKE 1 TABLET BY MOUTH FOUR TIMES DAILY EVERY NIGHT AT BEDTIME WITH MEALS, Disp: 120 tablet, Rfl: 1 .  pregabalin (LYRICA) 50 MG capsule, Take 1 capsule (50 mg total) by mouth at bedtime for 14 days, THEN 1 capsule (50 mg  total) 2 (two) times daily for 14 days., Disp: 42 capsule, Rfl: 0 No current facility-administered medications for this visit.   Facility-Administered Medications Ordered in Other Visits:  .  epirubicin (ELLENCE) 50 mg in sodium chloride 0.9 % bladder instillation, 50 mg, Bladder Instillation, Once, [name], MD  ROS  Constitutional: Denies any fever or chills Gastrointestinal: No reported hemesis, hematochezia, vomiting, or acute GI distress Musculoskeletal: Denies any acute onset joint swelling, redness, loss of ROM, or weakness Neurological: No reported episodes of acute onset apraxia, aphasia, dysarthria, agnosia, amnesia, paralysis, loss of coordination, or loss of consciousness  Allergies  Ms. Cronkright has No Known Allergies.  [place]  Drug: Ms. Mccully  reports that she does not use drugs. Alcohol:  reports that she does not drink alcohol. Tobacco:  reports that she has been smoking cigarettes. She has a 27.50 pack-year smoking history. She has never used smokeless tobacco. Medical:  has a past medical history of AICD (automatic cardioverter/defibrillator) present (placement 05/07/2014 medtronic    ), Arthritis, Bladder cancer ([place]) (urologist-  dr Junious Silk), Chronic hyponatremia, Chronic lower back pain, Chronic systolic (congestive) heart failure ([place]), COPD with emphysema ([place]),  Coronary artery disease (cardiologist-  dr Kathlyn [place]), DDD (degenerative disc disease), thoracolumbar, Ectopic atrial beats, Frequent urination, Full dentures, Gait instability, GERD (gastroesophageal reflux disease), History of iron deficiency anemia (11/2013), History of stomach ulcers (11/2013), Hypertension, LBBB (left bundle branch block), NICM (nonischemic cardiomyopathy) ([place]) (last echo 12-31-2015 ef 55-60%), Nocturia more than twice per night, and Scoliosis. Surgical: Ms. Hipp  has a past surgical history that includes Esophagogastroduodenoscopy (N/A, 11/28/2013); Transurethral resection of bladder tumor (N/A, 12/13/2013); Transurethral resection of bladder tumor (N/A, 01/17/2014); Cataract extraction w/ intraocular lens  implant, bilateral (Bilateral, right 12-2013 / left  02-2014); Cystoscopy with retrograde pyelogram, ureteroscopy and stent placement (Bilateral, 04/18/2014); Transurethral resection of bladder tumor (N/A, 04/18/2014); Lumbar disc surgery (1980's); bi-ventricular implantable cardioverter defibrillator (N/A, 05/07/2014); Cystoscopy w/ retrogrades (Bilateral, 01/06/2015); Cystoscopy with biopsy (N/A, 10/06/2015); Cardiac catheterization (03-03-2014  dr Kathlyn [place]   [facility]); Transurethral resection of bladder (1995); Cardiovascular stress test (11/25/2013); transthoracic echocardiogram (12/31/2015   dr Caryl Comes); and Cystoscopy with fulgeration (N/A, 06/30/2017). Family: family history includes Cancer in her brother and mother.  Constitutional Exam  General appearance: Well nourished, well developed, and well hydrated. In no apparent acute distress Vitals:   11/14/17 1344  BP: (!) 157/94  Pulse: 63  Resp: 18  Temp: 97.6 F (36.4 C)  SpO2: 100%  Weight: 125 lb (56.7 kg)  Height: 5' 8"  (1.727 m)   BMI Assessment: Estimated body mass index is 19.01 kg/m as calculated from the following:   Height as of this encounter: 5' 8"  (1.727 m).   Weight as of this encounter: 125 lb (56.7  kg).  BMI interpretation table: BMI level Category Range association with higher incidence of chronic pain  <18 kg/m2 Underweight   18.5-24.9 kg/m2 Ideal body weight   25-29.9 kg/m2 Overweight Increased incidence by 20%  30-34.9 kg/m2 Obese (Class I) Increased incidence by 68%  35-39.9 kg/m2 Severe obesity (Class II) Increased incidence by 136%  >40 kg/m2 Extreme obesity (Class III) Increased incidence by 254%   Patient's current BMI Ideal Body weight  Body mass index is 19.01 kg/m. Ideal body weight: 63.9 kg (140 lb 14 oz)   BMI Readings from Last 4 Encounters:  11/14/17 19.01 kg/m  10/09/17 19.01 kg/m  09/27/17 19.01 kg/m  08/31/17 19.01 kg/m   Wt Readings from Last 4 Encounters:  11/14/17  125 lb (56.7 kg)  10/09/17 125 lb (56.7 kg)  09/27/17 125 lb (56.7 kg)  08/31/17 125 lb (56.7 kg)  Psych/Mental status: Alert, oriented x 3 (person, place, & time)       Eyes: PERLA Respiratory: No evidence of acute respiratory distress  Cervical Spine Area Exam  Skin & Axial Inspection: No masses, redness, edema, swelling, or associated skin lesions Alignment: Symmetrical Functional ROM: Unrestricted ROM      Stability: No instability detected Muscle Tone/Strength: Functionally intact. No obvious neuro-muscular anomalies detected. Sensory (Neurological): Unimpaired Palpation: No palpable anomalies              Upper Extremity (UE) Exam    Side: Right upper extremity  Side: Left upper extremity  Skin & Extremity Inspection: Skin color, temperature, and hair growth are WNL. No peripheral edema or cyanosis. No masses, redness, swelling, asymmetry, or associated skin lesions. No contractures.  Skin & Extremity Inspection: Skin color, temperature, and hair growth are WNL. No peripheral edema or cyanosis. No masses, redness, swelling, asymmetry, or associated skin lesions. No contractures.  Functional ROM: Unrestricted ROM          Functional ROM: Unrestricted ROM          Muscle  Tone/Strength: Functionally intact. No obvious neuro-muscular anomalies detected.  Muscle Tone/Strength: Functionally intact. No obvious neuro-muscular anomalies detected.  Sensory (Neurological): Unimpaired          Sensory (Neurological): Unimpaired          Palpation: No palpable anomalies              Palpation: No palpable anomalies              Provocative Test(s):  Phalen's test: deferred Tinel's test: deferred Apley's scratch test (touch opposite shoulder):  Action 1 (Across chest): deferred Action 2 (Overhead): deferred Action 3 (LB reach): deferred   Provocative Test(s):  Phalen's test: deferred Tinel's test: deferred Apley's scratch test (touch opposite shoulder):  Action 1 (Across chest): deferred Action 2 (Overhead): deferred Action 3 (LB reach): deferred    Thoracic Spine Area Exam  Skin & Axial Inspection: No masses, redness, or swelling Alignment: Symmetrical Functional ROM: Unrestricted ROM Stability: No instability detected Muscle Tone/Strength: Functionally intact. No obvious neuro-muscular anomalies detected. Sensory (Neurological): Unimpaired Muscle strength & Tone: No palpable anomalies Lumbar Spine Area Exam  Skin & Axial Inspection:No masses, redness, or swelling Alignment:Symmetrical Functional DDU:[id number] ROM Stability:No instability detected Muscle Tone/Strength:Functionally intact. No obvious neuro-muscular anomalies detected. Sensory (Neurological):Musculoskeletal pain pattern and dermatomal Palpation:Complains of area being tender to palpationBilateral Fist Percussion Test Provocative Tests: Lumbar Hyperextension/rotation test:(+)bilaterally for facet joint pain. Lumbar quadrant test (Kemp's test):(+)due to pain, radicular pain Lumbar Lateral bending test:(+)due to pain. Patrick's Maneuver:deferred today FABER test:deferred today Thigh-thrust test:deferred today S-I compression  test:deferred today S-I distraction test:deferred today  Gait & Posture Assessment  Ambulation:Limited Gait:Limited. Using assistive device to ambulate Posture:Thoracic kyphosis  Lower Extremity Exam    Side:Right lower extremity  Side:Left lower extremity  Stability:No instability observed  Stability:No instability observed  Skin & Extremity Inspection:Skin color, temperature, and hair growth are WNL. No peripheral edema or cyanosis. No masses, redness, swelling, asymmetry, or associated skin lesions. No contractures.  Skin & Extremity Inspection:Skin color, temperature, and hair growth are WNL. No peripheral edema or cyanosis. No masses, redness, swelling, asymmetry, or associated skin lesions. No contractures.  Functional CBJ:[id number] ROM   Functional VPX:[id number] ROM   Muscle Tone/Strength:Functionally intact. No obvious neuro-muscular anomalies detected.  Muscle Tone/Strength:Functionally  intact. No obvious neuro-muscular anomalies detected.  Sensory (Neurological):Unimpaired  Sensory (Neurological):Unimpaired  Palpation:No palpable anomalies  Palpation:No palpable anomalies    Assessment  Primary Diagnosis & Pertinent Problem List: The primary encounter diagnosis was Lumbar radiculopathy. Diagnoses of Balance problem, Lumbar degenerative disc disease, Chronic pain syndrome, Spinal stenosis, lumbar region, with neurogenic claudication, Lumbar spondylosis, and SI joint arthritis were also pertinent to this visit.  Status Diagnosis  Persistent Persistent Persistent 1. Lumbar radiculopathy   2. Balance problem   3. Lumbar degenerative disc disease   4. Chronic pain syndrome   5. Spinal stenosis, lumbar region, with neurogenic claudication   6. Lumbar spondylosis   7. SI joint arthritis      General Recommendations: The pain condition that the patient suffers from is best treated with a  multidisciplinary approach that involves an increase in physical activity to prevent de-conditioning and worsening of the pain cycle, as well as psychological counseling (formal and/or informal) to address the co-morbid psychological affects of pain. Treatment will often involve judicious use of pain medications and interventional procedures to decrease the pain, allowing the patient to participate in the physical activity that will ultimately produce long-lasting pain reductions. The goal of the multidisciplinary approach is to return the patient to a higher level of overall function and to restore their ability to perform activities of daily living.  General Recommendations: The pain condition that the patient suffers from is best treated with a multidisciplinary approach that involves an increase in physical activity to prevent de-conditioning and worsening of the pain cycle, as well as psychological counseling (formal and/or informal) to address the co-morbid psychological affects of pain. Treatment will often involve judicious use of pain medications and interventional procedures to decrease the pain, allowing the patient to participate in the physical activity that will ultimately produce long-lasting pain reductions. The goal of the multidisciplinary approach is to return the patient to a higher level of overall function and to restore their ability to perform activities of daily living.  77 year old female with a history of axial low back pain secondary to lumbar radiculopathy, spinal and foraminal stenosis, spondylosis, degenerative disc disease, SI joint arthritis.  Patient is status post diagnostic lumbar facet medial branch nerve blocks at L3, L4, L5 along with bilateral SI joint injections which were not significantly helpful in her pain symptoms.  Patient is endorsing lateral thigh proximal buttock as well as groin pain.  Patient does have moderate to severe spinal stenosis in her lower lumbar  spine along with foraminal stenosis at L3, 4, 5. Patient follows up today for medication management as well as postprocedural evaluation after her left L5-S1 epidural steroid injection performed on 10/09/2017.  Patient states that she had benefit for approximately the first 48 hours but then had return of pain back to baseline after that.  She is very frustrated with her lack of pain control.    Patient has also seen Dr. Manuella Ghazi with neurology at the [facility] clinic for her balance issues.  Nerve conduction study was ordered as well as lab work.  At this point, we have exhausted many therapeutic modalities for this patient.  She is not a candidate for spinal cord stimulation given her extent of thoracic degenerative disease and canal stenosis.  I recommended the patient discontinue her gabapentin and trial Lyrica with titration instructions below.  If Lyrica is not effective, can consider opioid rotation to levorphanol but for the time being we will continue with hydrocodone since only want to make one  medication change at a time to see what is effective.  Plan of Care  Pharmacotherapy (Medications Ordered): Meds ordered this encounter  Medications  . pregabalin (LYRICA) 50 MG capsule    Sig: Take 1 capsule (50 mg total) by mouth at bedtime for 14 days, THEN 1 capsule (50 mg total) 2 (two) times daily for 14 days.    Dispense:  42 capsule    Refill:  0    Do not place this medication, or any other prescription from our practice, on "Automatic Refill". Patient may have prescription filled one day early if pharmacy is closed on scheduled refill date.  Marland Kitchen HYDROcodone-acetaminophen (NORCO) 10-325 MG tablet    Sig: Take 1 tablet by mouth every 4 (four) hours as needed for severe pain.    Dispense:  180 tablet    Refill:  0   Time Note: Greater than 50% of the 25 minute(s) of face-to-face time spent with Ms. Okubo, was spent in counseling/coordination of care regarding: Ms. Smartt primary cause of pain,  the treatment plan, treatment alternatives, medication side effects, the opioid analgesic risks and possible complications, the results, interpretation and significance of  her recent diagnostic interventional treatment(s), the appropriate use of her medications, realistic expectations, the goals of pain management (increased in functionality), the medication agreement and the patient's responsibilities when it comes to controlled substances.  Provider-requested follow-up: Return in about 4 weeks (around 12/12/2017) for Medication Management.  Future Appointments  Date Time Provider [place]  11/28/2017  7:45 AM [facility] DEVICE REMOTES [facility]  12/12/2017  1:00 PM [name], MD [facility] None  06/13/2018  3:00 PM O'Brien-Blaney, Bryson Corona, LPN [facility]    Primary Care Physician: [name], MD Location: [facility] Note by: [name], M.D Date: 11/14/2017; Time: 3:42 PM  There are no Patient Instructions on file for this visit.

## 2017-11-14 NOTE — Progress Notes (Signed)
Nursing Pain Medication Assessment:  Safety precautions to be maintained throughout the outpatient stay will include: orient to surroundings, keep bed in low position, maintain call bell within reach at all times, provide assistance with transfer out of bed and ambulation.  Medication Inspection Compliance: Pill count conducted under aseptic conditions, in front of the patient. Neither the pills nor the bottle was removed from the patient's sight at any time. Once count was completed pills were immediately returned to the patient in their original bottle.  Medication: Hydrocodone/APAP Pill/Patch Count: 82 of 180 pills remain Pill/Patch Appearance: Markings consistent with prescribed medication Bottle Appearance: Standard pharmacy container. Clearly labeled. Filled Date: 08/ 10 / 2019 Last Medication intake:  Today

## 2017-11-23 ENCOUNTER — Encounter: Payer: Medicare Other | Admitting: Student in an Organized Health Care Education/Training Program

## 2017-11-28 ENCOUNTER — Ambulatory Visit (INDEPENDENT_AMBULATORY_CARE_PROVIDER_SITE_OTHER): Payer: Medicare Other | Admitting: *Deleted

## 2017-11-28 DIAGNOSIS — I428 Other cardiomyopathies: Secondary | ICD-10-CM | POA: Diagnosis not present

## 2017-11-28 NOTE — Progress Notes (Signed)
Remote ICD transmission.   

## 2017-11-29 ENCOUNTER — Other Ambulatory Visit: Payer: Self-pay | Admitting: Internal Medicine

## 2017-12-12 ENCOUNTER — Encounter: Payer: Self-pay | Admitting: Student in an Organized Health Care Education/Training Program

## 2017-12-12 ENCOUNTER — Ambulatory Visit
Payer: Medicare Other | Attending: Student in an Organized Health Care Education/Training Program | Admitting: Student in an Organized Health Care Education/Training Program

## 2017-12-12 ENCOUNTER — Other Ambulatory Visit: Payer: Self-pay

## 2017-12-12 VITALS — BP 153/81 | HR 73 | Temp 98.1°F | Resp 18 | Ht 68.0 in | Wt 125.0 lb

## 2017-12-12 DIAGNOSIS — M4802 Spinal stenosis, cervical region: Secondary | ICD-10-CM | POA: Diagnosis not present

## 2017-12-12 DIAGNOSIS — E871 Hypo-osmolality and hyponatremia: Secondary | ICD-10-CM | POA: Insufficient documentation

## 2017-12-12 DIAGNOSIS — E785 Hyperlipidemia, unspecified: Secondary | ICD-10-CM | POA: Diagnosis not present

## 2017-12-12 DIAGNOSIS — M4726 Other spondylosis with radiculopathy, lumbar region: Secondary | ICD-10-CM | POA: Diagnosis not present

## 2017-12-12 DIAGNOSIS — G8929 Other chronic pain: Secondary | ICD-10-CM | POA: Insufficient documentation

## 2017-12-12 DIAGNOSIS — F329 Major depressive disorder, single episode, unspecified: Secondary | ICD-10-CM | POA: Insufficient documentation

## 2017-12-12 DIAGNOSIS — M25552 Pain in left hip: Secondary | ICD-10-CM | POA: Insufficient documentation

## 2017-12-12 DIAGNOSIS — M5116 Intervertebral disc disorders with radiculopathy, lumbar region: Secondary | ICD-10-CM | POA: Insufficient documentation

## 2017-12-12 DIAGNOSIS — M5416 Radiculopathy, lumbar region: Secondary | ICD-10-CM | POA: Diagnosis not present

## 2017-12-12 DIAGNOSIS — M5136 Other intervertebral disc degeneration, lumbar region: Secondary | ICD-10-CM

## 2017-12-12 DIAGNOSIS — F17210 Nicotine dependence, cigarettes, uncomplicated: Secondary | ICD-10-CM | POA: Insufficient documentation

## 2017-12-12 DIAGNOSIS — I5022 Chronic systolic (congestive) heart failure: Secondary | ICD-10-CM | POA: Diagnosis not present

## 2017-12-12 DIAGNOSIS — I420 Dilated cardiomyopathy: Secondary | ICD-10-CM | POA: Diagnosis not present

## 2017-12-12 DIAGNOSIS — J439 Emphysema, unspecified: Secondary | ICD-10-CM | POA: Insufficient documentation

## 2017-12-12 DIAGNOSIS — M48062 Spinal stenosis, lumbar region with neurogenic claudication: Secondary | ICD-10-CM | POA: Diagnosis not present

## 2017-12-12 DIAGNOSIS — M47816 Spondylosis without myelopathy or radiculopathy, lumbar region: Secondary | ICD-10-CM | POA: Diagnosis not present

## 2017-12-12 DIAGNOSIS — Z950 Presence of cardiac pacemaker: Secondary | ICD-10-CM | POA: Diagnosis not present

## 2017-12-12 DIAGNOSIS — Z809 Family history of malignant neoplasm, unspecified: Secondary | ICD-10-CM | POA: Diagnosis not present

## 2017-12-12 DIAGNOSIS — Z8719 Personal history of other diseases of the digestive system: Secondary | ICD-10-CM | POA: Insufficient documentation

## 2017-12-12 DIAGNOSIS — Z8551 Personal history of malignant neoplasm of bladder: Secondary | ICD-10-CM | POA: Insufficient documentation

## 2017-12-12 DIAGNOSIS — I447 Left bundle-branch block, unspecified: Secondary | ICD-10-CM | POA: Diagnosis not present

## 2017-12-12 DIAGNOSIS — K219 Gastro-esophageal reflux disease without esophagitis: Secondary | ICD-10-CM | POA: Insufficient documentation

## 2017-12-12 DIAGNOSIS — Z9889 Other specified postprocedural states: Secondary | ICD-10-CM | POA: Diagnosis not present

## 2017-12-12 DIAGNOSIS — Z79899 Other long term (current) drug therapy: Secondary | ICD-10-CM | POA: Insufficient documentation

## 2017-12-12 DIAGNOSIS — I110 Hypertensive heart disease with heart failure: Secondary | ICD-10-CM | POA: Insufficient documentation

## 2017-12-12 DIAGNOSIS — M4805 Spinal stenosis, thoracolumbar region: Secondary | ICD-10-CM | POA: Diagnosis not present

## 2017-12-12 DIAGNOSIS — I2510 Atherosclerotic heart disease of native coronary artery without angina pectoris: Secondary | ICD-10-CM | POA: Diagnosis not present

## 2017-12-12 MED ORDER — HYDROCODONE-ACETAMINOPHEN 10-325 MG PO TABS: 1.0000 | ORAL_TABLET | ORAL | 0 refills | Status: DC | PRN

## 2017-12-12 MED ORDER — HYDROCODONE-ACETAMINOPHEN 10-325 MG PO TABS: 1.0000 | ORAL_TABLET | ORAL | 0 refills | Status: AC | PRN

## 2017-12-12 NOTE — Patient Instructions (Signed)
You have 2 scripts for Hydrocodone escribed today.  Neurosurgery consult.

## 2017-12-12 NOTE — Progress Notes (Signed)
Nursing Pain Medication Assessment:  Safety precautions to be maintained throughout the outpatient stay will include: orient to surroundings, keep bed in low position, maintain call bell within reach at all times, provide assistance with transfer out of bed and ambulation.  Medication Inspection Compliance: Pill count conducted under aseptic conditions, in front of the patient. Neither the pills nor the bottle was removed from the patient's sight at any time. Once count was completed pills were immediately returned to the patient in their original bottle.  Medication: Hydrocodone/APAP Pill/Patch Count: 102 of 180 pills remain Pill/Patch Appearance: Markings consistent with prescribed medication Bottle Appearance: Standard pharmacy container. Clearly labeled. Filled Date: 09/ 11 / 2019 Last Medication intake:  Today

## 2017-12-12 NOTE — Progress Notes (Signed)
Patient's Name: [name]  MRN: [id number]  Referring Provider: [name], MD  DOB: 04-22-1940  PCP: Bianca Mc, MD  DOS: 12/12/2017  Note by: Bianca Santa, MD  Service setting: Ambulatory outpatient  Specialty: Interventional Pain Management  Location: [facility] (AMB) Pain Management Facility    Patient type: Established   Primary Reason(s) for Visit: Encounter for prescription drug management. (Level of risk: moderate)  CC: Back Pain; Hip Pain (left); and Leg Pain (left)  HPI  [name] is a 77 y.o. year old, female patient, who comes today for a medication management evaluation. She has Palpitations; Hyponatremia; Bladder cancer ([place]); LBBB (left bundle branch block); Atrial ectopy; Congestive dilated cardiomyopathy ([place]); History of gastric ulcer; Spinal stenosis of thoracolumbar region; COPD (chronic obstructive pulmonary disease) ([place]); Constipation; Spinal stenosis in cervical region; Chronic bilateral low back pain; NSAID induced gastritis; Parkinsonian features; Hypertension; Spinal stenosis of lumbar region without neurogenic claudication; Other idiopathic scoliosis, lumbar region; Hyperlipidemia; S/P placement of cardiac pacemaker; Coronary artery disease; Lumbar spondylosis; SI joint arthritis; and Lumbar degenerative disc disease on their problem list. Her primarily concern today is the Back Pain; Hip Pain (left); and Leg Pain (left)  Pain Assessment: Location: Lower Back Radiating: radiates into left hip and buttock Onset: More than a month ago Duration: Chronic pain Quality: Constant, Discomfort Severity: 8 /10 (subjective, self-reported pain score)  Note: Reported level is inconsistent with clinical observations.  Effect on ADL: Limits activities Timing: Constant Modifying factors: meds BP: (!) 153/81  HR: 73  [name] was last scheduled for an appointment on 11/14/2017 for medication management. During today's appointment we reviewed [name] chronic pain status, as  well as her outpatient medication regimen.  The patient  reports that she does not use drugs. Her body mass index is 19.01 kg/m.  Further details on both, my assessment(s), as well as the proposed treatment plan, please see below.  Controlled Substance Pharmacotherapy Assessment REMS (Risk Evaluation and Mitigation Strategy)  Analgesic:Hydrocodone 10 mg every 4 hours as needed, quantity 180 MME/day:14m/day. TDewayne Shorter RN  12/12/2017  1:03 PM  Signed Nursing Pain Medication Assessment:  Safety precautions to be maintained throughout the outpatient stay will include: orient to surroundings, keep bed in low position, maintain call bell within reach at all times, provide assistance with transfer out of bed and ambulation.  Medication Inspection Compliance: Pill count conducted under aseptic conditions, in front of the patient. Neither the pills nor the bottle was removed from the patient's sight at any time. Once count was completed pills were immediately returned to the patient in their original bottle.  Medication: Hydrocodone/APAP Pill/Patch Count: 102 of 180 pills remain Pill/Patch Appearance: Markings consistent with prescribed medication Bottle Appearance: Standard pharmacy container. Clearly labeled. Filled Date: 09/ 11 / 2019 Last Medication intake:  Today   Pharmacokinetics: Liberation and absorption (onset of action): WNL Distribution (time to peak effect): WNL Metabolism and excretion (duration of action): WNL         Pharmacodynamics: Desired effects: Analgesia: Ms. LTobiareports >50% benefit. Functional ability: Patient reports that medication allows her to accomplish basic ADLs Clinically meaningful improvement in function (CMIF): Sustained CMIF goals met Perceived effectiveness: Described as relatively effective, allowing for increase in activities of daily living (ADL) Undesirable effects: Side-effects or Adverse reactions: None reported Monitoring: [place] PMP:  Online review of the past 170-montheriod conducted. Compliant with practice rules and regulations Last UDS on record: Summary  Date Value Ref Range Status  12/27/2016  FINAL  Final    Comment:    ==================================================================== TOXASSURE COMP DRUG ANALYSIS,UR ==================================================================== Test                             Result       Flag       Units Drug Present and Declared for Prescription Verification   Hydrocodone                    3156         EXPECTED   ng/mg creat   Hydromorphone                  848          EXPECTED   ng/mg creat   Dihydrocodeine                 236          EXPECTED   ng/mg creat   Norhydrocodone                 3780         EXPECTED   ng/mg creat    Sources of hydrocodone include scheduled prescription    medications. Hydromorphone, dihydrocodeine and norhydrocodone are    expected metabolites of hydrocodone. Hydromorphone and    dihydrocodeine are also available as scheduled prescription    medications.   Gabapentin                     PRESENT      EXPECTED   Cyclobenzaprine                PRESENT      EXPECTED   Desmethylcyclobenzaprine       PRESENT      EXPECTED    Desmethylcyclobenzaprine is an expected metabolite of    cyclobenzaprine.   Acetaminophen                  PRESENT      EXPECTED   Metoprolol                     PRESENT      EXPECTED Drug Absent but Declared for Prescription Verification   Duloxetine                     Not Detected UNEXPECTED   Promethazine                   Not Detected UNEXPECTED ==================================================================== Test                      Result    Flag   Units      Ref Range   Creatinine              25               mg/dL      >=20 ==================================================================== Declared Medications:  The flagging and interpretation on this report are based on the  following declared  medications.  Unexpected results may arise from  inaccuracies in the declared medications.  **Note: The testing scope of this panel includes these medications:  Cyclobenzaprine (Flexeril)  Duloxetine (Cymbalta)  Gabapentin (Neurontin)  Hydrocodone (Norco)  Metoprolol (Toprol)  Promethazine (Phenergan)  **Note: The testing scope of this panel does not include small to  moderate amounts of these reported  medications:  Acetaminophen (Norco)  **Note: The testing scope of this panel does not include following  reported medications:  Amlodipine (Norvasc)  Lisinopril (Prinivil)  Pantoprazole (Protonix)  Polyethylene Glycol (MiraLAX)  Spironolactone (Aldactone)  Sucralfate (Carafate) ==================================================================== For clinical consultation, please call [phone]. ====================================================================    UDS interpretation: Compliant          Medication Assessment Form: Reviewed. Patient indicates being compliant with therapy Treatment compliance: Compliant Risk Assessment Profile: Aberrant behavior: See prior evaluations. None observed or detected today Comorbid factors increasing risk of overdose: See prior notes. No additional risks detected today Opioid risk tool (ORT) (Total Score): 0 Personal History of Substance Abuse (SUD-Substance use disorder):  Alcohol: Negative  Illegal Drugs: Negative  Rx Drugs: Negative  ORT Risk Level calculation: Low Risk Risk of substance use disorder (SUD): Low Opioid Risk Tool - 12/12/17 1300      Family History of Substance Abuse   Alcohol  Negative    Illegal Drugs  Negative    Rx Drugs  Negative      Personal History of Substance Abuse   Alcohol  Negative    Illegal Drugs  Negative    Rx Drugs  Negative      Age   Age between 57-45 years   No      History of Preadolescent Sexual Abuse   History of Preadolescent Sexual Abuse  Negative or Female       Psychological Disease   Psychological Disease  Negative    Depression  Negative      Total Score   Opioid Risk Tool Scoring  0    Opioid Risk Interpretation  Low Risk      ORT Scoring interpretation table:  Score <3 = Low Risk for SUD  Score between 4-7 = Moderate Risk for SUD  Score >8 = High Risk for Opioid Abuse   Risk Mitigation Strategies:  Patient Counseling: Covered Patient-Prescriber Agreement (PPA): Present and active  Notification to other healthcare providers: Done  Pharmacologic Plan: No change in therapy, at this time.             Laboratory Chemistry  Inflammation Markers (CRP: Acute Phase) (ESR: Chronic Phase) No results found for: CRP, ESRSEDRATE, LATICACIDVEN                       Rheumatology Markers No results found for: RF, ANA, LABURIC, URICUR, LYMEIGGIGMAB, LYMEABIGMQN, HLAB27                      Renal Function Markers Lab Results  Component Value Date   BUN 11 03/24/2017   CREATININE 0.73 03/24/2017   BCR 18 04/28/2014   GFRAA >60 10/02/2015   GFRNONAA >60 10/02/2015                             Hepatic Function Markers Lab Results  Component Value Date   AST 25 03/24/2017   ALT 17 03/24/2017   ALBUMIN 4.4 03/24/2017   ALKPHOS 105 03/24/2017   LIPASE 30 11/23/2013                        Electrolytes Lab Results  Component Value Date   NA 136 06/30/2017   K 4.2 06/30/2017   CL 99 03/24/2017   CALCIUM 9.9 03/24/2017  Neuropathy Markers Lab Results  Component Value Date   TDDUKGUR42 7,062 (H) 12/27/2013   HGBA1C 4.6 12/27/2013                        CNS Tests No results found for: COLORCSF, APPEARCSF, RBCCOUNTCSF, WBCCSF, POLYSCSF, LYMPHSCSF, EOSCSF, PROTEINCSF, GLUCCSF, JCVIRUS, CSFOLI, IGGCSF                      Bone Pathology Markers No results found for: VD25OH, [id number], HY0737TG6, YI9485IO2, 25OHVITD1, 25OHVITD2, 25OHVITD3, TESTOFREE, TESTOSTERONE                       Coagulation  Parameters Lab Results  Component Value Date   INR 1.0 04/28/2014   LABPROT 10.3 04/28/2014   APTT 27 11/22/2013   PLT 209 10/02/2015                        Cardiovascular Markers Lab Results  Component Value Date   CKTOTAL 87 09/01/2010   CKMB 4.1 (H) 09/01/2010   TROPONINI <0.30 11/23/2013   HGB 13.3 06/30/2017   HCT 39.0 06/30/2017                         CA Markers No results found for: CEA, CA125, LABCA2                      Note: Lab results reviewed.  Recent Diagnostic Imaging Results  DG C-Arm 1-60 Min-No Report Fluoroscopy was utilized by the requesting physician.  No radiographic  interpretation.   Complexity Note: Imaging results reviewed. Results shared with Ms. Keshishyan, using Layman's terms.                         Meds   Current Outpatient Medications:  .  amLODipine (NORVASC) 2.5 MG tablet, Take 2.5 mg by mouth daily., Disp: , Rfl:  .  [START ON 12/27/2017] HYDROcodone-acetaminophen (NORCO) 10-325 MG tablet, Take 1 tablet by mouth every 4 (four) hours as needed for severe pain., Disp: 180 tablet, Rfl: 0 .  lisinopril (PRINIVIL,ZESTRIL) 40 MG tablet, TAKE 1 TABLET(40 MG) BY MOUTH DAILY, Disp: 90 tablet, Rfl: 0 .  metoprolol succinate (TOPROL-XL) 25 MG 24 hr tablet, Take 1 tablet (25 mg total) by mouth daily. (Patient taking differently: Take 25 mg by mouth every morning. ), Disp: 30 tablet, Rfl: 10 .  pantoprazole (PROTONIX) 40 MG tablet, TAKE 1 TABLET(40 MG) BY MOUTH TWICE DAILY, Disp: 180 tablet, Rfl: 1 .  polyethylene glycol (MIRALAX / GLYCOLAX) packet, Take 17 g by mouth daily as needed for mild constipation. , Disp: , Rfl:  .  pregabalin (LYRICA) 50 MG capsule, Take 1 capsule (50 mg total) by mouth at bedtime for 14 days, THEN 1 capsule (50 mg total) 2 (two) times daily for 14 days., Disp: 42 capsule, Rfl: 0 .  promethazine (PHENERGAN) 12.5 MG tablet, TAKE 1 TABLET BY MOUTH EVERY 8 HOURS AS NEEDED FOR NAUSEA OR VOMITING, Disp: 60 tablet, Rfl: 0 .  sucralfate  (CARAFATE) 1 g tablet, TAKE 1 TABLET BY MOUTH FOUR TIMES DAILY EVERY NIGHT AT BEDTIME WITH MEALS, Disp: 120 tablet, Rfl: 1 .  [START ON 01/26/2018] HYDROcodone-acetaminophen (NORCO) 10-325 MG tablet, Take 1 tablet by mouth every 4 (four) hours as needed for severe pain., Disp: 180 tablet, Rfl: 0 .  Vitamin D,  Ergocalciferol, (DRISDOL) 50000 units CAPS capsule, TK ONE C PO Q WEEK FOR 8 WEEKS, Disp: , Rfl: 0 No current facility-administered medications for this visit.   Facility-Administered Medications Ordered in Other Visits:  .  epirubicin (ELLENCE) 50 mg in sodium chloride 0.9 % bladder instillation, 50 mg, Bladder Instillation, Once, [name], MD  ROS  Constitutional: Denies any fever or chills Gastrointestinal: No reported hemesis, hematochezia, vomiting, or acute GI distress Musculoskeletal: Denies any acute onset joint swelling, redness, loss of ROM, or weakness Neurological: No reported episodes of acute onset apraxia, aphasia, dysarthria, agnosia, amnesia, paralysis, loss of coordination, or loss of consciousness  Allergies  Ms. Kalisz has No Known Allergies.  [place]  Drug: Ms. Watterson  reports that she does not use drugs. Alcohol:  reports that she does not drink alcohol. Tobacco:  reports that she has been smoking cigarettes. She has a 27.50 pack-year smoking history. She has never used smokeless tobacco. Medical:  has a past medical history of AICD (automatic cardioverter/defibrillator) present (placement 05/07/2014 medtronic    ), Arthritis, Bladder cancer ([place]) (urologist-  dr Junious Silk), Chronic hyponatremia, Chronic lower back pain, Chronic systolic (congestive) heart failure ([place]), COPD with emphysema ([place]), Coronary artery disease (cardiologist-  dr Kathlyn [place]), DDD (degenerative disc disease), thoracolumbar, Ectopic atrial beats, Frequent urination, Full dentures, Gait instability, GERD (gastroesophageal reflux disease), History of iron deficiency anemia (11/2013), History  of stomach ulcers (11/2013), Hypertension, LBBB (left bundle branch block), NICM (nonischemic cardiomyopathy) ([place]) (last echo 12-31-2015 ef 55-60%), Nocturia more than twice per night, and Scoliosis. Surgical: Ms. Kirt  has a past surgical history that includes Esophagogastroduodenoscopy (N/A, 11/28/2013); Transurethral resection of bladder tumor (N/A, 12/13/2013); Transurethral resection of bladder tumor (N/A, 01/17/2014); Cataract extraction w/ intraocular lens  implant, bilateral (Bilateral, right 12-2013 / left  02-2014); Cystoscopy with retrograde pyelogram, ureteroscopy and stent placement (Bilateral, 04/18/2014); Transurethral resection of bladder tumor (N/A, 04/18/2014); Lumbar disc surgery (1980's); bi-ventricular implantable cardioverter defibrillator (N/A, 05/07/2014); Cystoscopy w/ retrogrades (Bilateral, 01/06/2015); Cystoscopy with biopsy (N/A, 10/06/2015); Cardiac catheterization (03-03-2014  dr Kathlyn [place]   [facility]); Transurethral resection of bladder (1995); Cardiovascular stress test (11/25/2013); transthoracic echocardiogram (12/31/2015   dr Caryl Comes); and Cystoscopy with fulgeration (N/A, 06/30/2017). Family: family history includes Cancer in her brother and mother.  Constitutional Exam  General appearance: Well nourished, well developed, and well hydrated. In no apparent acute distress Vitals:   12/12/17 1256  BP: (!) 153/81  Pulse: 73  Resp: 18  Temp: 98.1 F (36.7 C)  SpO2: 98%  Weight: 125 lb (56.7 kg)  Height: 5' 8"  (1.727 m)   BMI Assessment: Estimated body mass index is 19.01 kg/m as calculated from the following:   Height as of this encounter: 5' 8"  (1.727 m).   Weight as of this encounter: 125 lb (56.7 kg).  BMI interpretation table: BMI level Category Range association with higher incidence of chronic pain  <18 kg/m2 Underweight   18.5-24.9 kg/m2 Ideal body weight   25-29.9 kg/m2 Overweight Increased incidence by 20%  30-34.9 kg/m2 Obese (Class I) Increased incidence  by 68%  35-39.9 kg/m2 Severe obesity (Class II) Increased incidence by 136%  >40 kg/m2 Extreme obesity (Class III) Increased incidence by 254%   Patient's current BMI Ideal Body weight  Body mass index is 19.01 kg/m. Ideal body weight: 63.9 kg (140 lb 14 oz)   BMI Readings from Last 4 Encounters:  12/12/17 19.01 kg/m  11/14/17 19.01 kg/m  10/09/17 19.01 kg/m  09/27/17 19.01 kg/m   Wt Readings  from Last 4 Encounters:  12/12/17 125 lb (56.7 kg)  11/14/17 125 lb (56.7 kg)  10/09/17 125 lb (56.7 kg)  09/27/17 125 lb (56.7 kg)  Psych/Mental status: Alert, oriented x 3 (person, place, & time)       Eyes: PERLA Respiratory: No evidence of acute respiratory distress  Cervical Spine Area Exam  Skin & Axial Inspection: No masses, redness, edema, swelling, or associated skin lesions Alignment: Symmetrical Functional ROM: Unrestricted ROM      Stability: No instability detected Muscle Tone/Strength: Functionally intact. No obvious neuro-muscular anomalies detected. Sensory (Neurological): Unimpaired Palpation: No palpable anomalies              Upper Extremity (UE) Exam    Side: Right upper extremity  Side: Left upper extremity  Skin & Extremity Inspection: Skin color, temperature, and hair growth are WNL. No peripheral edema or cyanosis. No masses, redness, swelling, asymmetry, or associated skin lesions. No contractures.  Skin & Extremity Inspection: Skin color, temperature, and hair growth are WNL. No peripheral edema or cyanosis. No masses, redness, swelling, asymmetry, or associated skin lesions. No contractures.  Functional ROM: Unrestricted ROM          Functional ROM: Unrestricted ROM          Muscle Tone/Strength: Functionally intact. No obvious neuro-muscular anomalies detected.  Muscle Tone/Strength: Functionally intact. No obvious neuro-muscular anomalies detected.  Sensory (Neurological): Unimpaired          Sensory (Neurological): Unimpaired          Palpation: No palpable  anomalies              Palpation: No palpable anomalies              Provocative Test(s):  Phalen's test: deferred Tinel's test: deferred Apley's scratch test (touch opposite shoulder):  Action 1 (Across chest): deferred Action 2 (Overhead): deferred Action 3 (LB reach): deferred   Provocative Test(s):  Phalen's test: deferred Tinel's test: deferred Apley's scratch test (touch opposite shoulder):  Action 1 (Across chest): deferred Action 2 (Overhead): deferred Action 3 (LB reach): deferred    Thoracic Spine Area Exam  Skin & Axial Inspection: No masses, redness, or swelling Alignment: Symmetrical Functional ROM: Unrestricted ROM Stability: No instability detected Muscle Tone/Strength: Functionally intact. No obvious neuro-muscular anomalies detected. Sensory (Neurological): Unimpaired Muscle strength & Tone: No palpable anomalies   Lumbar Spine Area Exam  Skin & Axial Inspection:No masses, redness, or swelling Alignment:Symmetrical Functional VWP:[id number] ROM Stability:No instability detected Muscle Tone/Strength:Functionally intact. No obvious neuro-muscular anomalies detected. Sensory (Neurological):Musculoskeletal pain patternand dermatomal Palpation:Complains of area being tender to palpationBilateral Fist Percussion Test Provocative Tests: Lumbar Hyperextension/rotation test:(+)bilaterally for facet joint pain. Lumbar quadrant test (Kemp's test):(+)due to pain, radicular pain Lumbar Lateral bending test:(+)due to pain. Patrick's Maneuver:deferred today FABER test:deferred today Thigh-thrust test:deferred today S-I compression test:deferred today S-I distraction test:deferred today  Gait & Posture Assessment  Ambulation:Limited Gait:Limited. Using assistive device to ambulate Posture:Thoracic kyphosis  Lower Extremity Exam    Side: Right lower extremity  Side: Left lower extremity   Stability: No instability observed          Stability: No instability observed          Skin & Extremity Inspection: Skin color, temperature, and hair growth are WNL. No peripheral edema or cyanosis. No masses, redness, swelling, asymmetry, or associated skin lesions. No contractures.  Skin & Extremity Inspection: Skin color, temperature, and hair growth are WNL. No peripheral edema or cyanosis. No masses, redness, swelling,  asymmetry, or associated skin lesions. No contractures.  Functional ROM: Unrestricted ROM                  Functional ROM: Unrestricted ROM                  Muscle Tone/Strength: Functionally intact. No obvious neuro-muscular anomalies detected.  Muscle Tone/Strength: Functionally intact. No obvious neuro-muscular anomalies detected.  Sensory (Neurological): Unimpaired  Sensory (Neurological): Unimpaired  Palpation: No palpable anomalies  Palpation: No palpable anomalies   Assessment  Primary Diagnosis & Pertinent Problem List: The primary encounter diagnosis was Lumbar radiculopathy. Diagnoses of Lumbar degenerative disc disease, Spinal stenosis, lumbar region, with neurogenic claudication, and Lumbar spondylosis were also pertinent to this visit.  Status Diagnosis  Worsening Worsening Worsening 1. Lumbar radiculopathy   2. Lumbar degenerative disc disease   3. Spinal stenosis, lumbar region, with neurogenic claudication   4. Lumbar spondylosis      General Recommendations: The pain condition that the patient suffers from is best treated with a multidisciplinary approach that involves an increase in physical activity to prevent de-conditioning and worsening of the pain cycle, as well as psychological counseling (formal and/or informal) to address the co-morbid psychological affects of pain. Treatment will often involve judicious use of pain medications and interventional procedures to decrease the pain, allowing the patient to participate in the physical activity that  will ultimately produce long-lasting pain reductions. The goal of the multidisciplinary approach is to return the patient to a higher level of overall function and to restore their ability to perform activities of daily living.  77 year old female with a history of axial low back pain secondary to lumbarradiculopathy, spinal and foraminal stenosis,spondylosis, degenerative disc disease, SI joint arthritis.Patient is status post diagnostic lumbar facet medial branch nerve blocks at L3, L4, L5 along with bilateral SI joint injections which were not significantly helpful in her pain symptoms. Patient is endorsing lateral thigh proximal buttock as well as groin pain. Patient does have moderate to severe spinal stenosis in her lower lumbar spine along with foraminal stenosis at L3, 4, 5.   Patient is also status post left L5-S1 epidural steroid injection on 10/09/2017 which was only beneficial for the first 48 hours with return of pain back to baseline thereafter.  Patient is frustrated with her pain would like a second opinion regarding spinal cord stimulation and or minimally invasive lumbar decompression.  I did evaluate the patient for thoracolumbar spinal cord stimulation and given the extent of her thoracic kyphosis as well as lumbar scoliosis, I have concerns about safely getting into her epidural space via percutaneous route.  I will refer the patient to Dr. Mearl Latin at [facility] neurosurgery for consideration of spinal cord stimulation versus mild procedure.  I will refill patient's hydrocodone as below.  She is instructed to continue Lyrica as prescribed.  Plan of Care  Pharmacotherapy (Medications Ordered): Meds ordered this encounter  Medications  . HYDROcodone-acetaminophen (NORCO) 10-325 MG tablet    Sig: Take 1 tablet by mouth every 4 (four) hours as needed for severe pain.    Dispense:  180 tablet    Refill:  0  . HYDROcodone-acetaminophen (NORCO) 10-325 MG tablet    Sig: Take 1 tablet by  mouth every 4 (four) hours as needed for severe pain.    Dispense:  180 tablet    Refill:  0    Do not place this medication, or any other prescription from our practice, on "Automatic Refill". Patient may have  prescription filled one day early if pharmacy is closed on scheduled refill date.   Lab-work, procedure(s), and/or referral(s): Orders Placed This Encounter  Procedures  . Ambulatory referral to Neurosurgery   Time Note: Greater than 50% of the 25 minute(s) of face-to-face time spent with Ms. Miron, was spent in counseling/coordination of care regarding: Ms. Isola primary cause of pain, the treatment plan, treatment alternatives, medication side effects, the appropriate use of her medications, realistic expectations, the goals of pain management (increased in functionality), the medication agreement and the patient's responsibilities when it comes to controlled substances.  Provider-requested follow-up: Return in about 8 weeks (around 02/06/2018) for Medication Management.  Future Appointments  Date Time Provider [place]  01/03/2018  1:45 PM Huprich, Lyndel Safe, PT [facility] None  01/09/2018  1:00 PM Mansfield, Kristine S, PT [facility] None  01/11/2018  3:15 PM Mansfield, Kristine S, PT [facility] None  01/16/2018  1:00 PM Mansfield, Kristine S, PT [facility] None  01/18/2018  2:30 PM Mansfield, Kristine S, PT [facility] None  01/22/2018  1:00 PM Mansfield, Kristine S, PT [facility] None  01/24/2018  3:15 PM Mansfield, Kristine S, PT [facility] None  01/30/2018  2:30 PM Mansfield, Kristine S, PT [facility] None  02/01/2018  2:45 PM Mansfield, Kristine S, PT [facility] None  02/05/2018  2:00 PM Mansfield, Kristine S, PT [facility] None  02/06/2018 12:00 PM Bianca Santa, MD [facility] None  02/07/2018  2:00 PM Mansfield, Kristine S, PT [facility] None  02/12/2018  2:00 PM Mansfield, Kristine S, PT [facility] None  02/14/2018  2:00 PM Mansfield, Kristine S, PT [facility] None  02/19/2018   2:00 PM Mansfield, Kristine S, PT [facility] None  02/21/2018  2:00 PM Mansfield, Kristine S, PT [facility] None  02/26/2018  2:00 PM Burr, Delcambre S, PT [facility] None  02/27/2018  7:40 AM [facility] DEVICE REMOTES [facility]  02/28/2018  2:00 PM Mansfield, Kristine S, PT [facility] None  06/13/2018  3:00 PM O'Brien-Blaney, Bryson Corona, LPN [facility]    Primary Care Physician: Bianca Mc, MD Location: [facility] Note by: [name], M.D Date: 12/12/2017; Time: 3:51 PM  Patient Instructions  You have 2 scripts for Hydrocodone escribed today.  Neurosurgery consult.

## 2017-12-15 DIAGNOSIS — R29898 Other symptoms and signs involving the musculoskeletal system: Secondary | ICD-10-CM | POA: Diagnosis not present

## 2017-12-19 LAB — CUP PACEART REMOTE DEVICE CHECK
Brady Statistic AP VP Percent: 47.14 %
Brady Statistic AP VS Percent: 0.8 %
Brady Statistic AS VP Percent: 50.8 %
Brady Statistic RA Percent Paced: 47.1 %
HighPow Impedance: 68 Ohm
Implantable Lead Implant Date: 20160217
Implantable Lead Implant Date: 20160217
Implantable Lead Location: 753858
Implantable Lead Model: 5076
Lead Channel Impedance Value: 342 Ohm
Lead Channel Impedance Value: 361 Ohm
Lead Channel Impedance Value: 361 Ohm
Lead Channel Impedance Value: 361 Ohm
Lead Channel Impedance Value: 399 Ohm
Lead Channel Impedance Value: 475 Ohm
Lead Channel Impedance Value: 646 Ohm
Lead Channel Impedance Value: 722 Ohm
Lead Channel Pacing Threshold Amplitude: 0.5 V
Lead Channel Pacing Threshold Amplitude: 0.625 V
Lead Channel Pacing Threshold Pulse Width: 0.4 ms
Lead Channel Sensing Intrinsic Amplitude: 20 mV
Lead Channel Sensing Intrinsic Amplitude: 3.5 mV
Lead Channel Setting Pacing Amplitude: 2 V
Lead Channel Setting Pacing Amplitude: 2.5 V
Lead Channel Setting Sensing Sensitivity: 0.3 mV
MDC IDC LEAD IMPLANT DT: 20160217
MDC IDC LEAD LOCATION: 753859
MDC IDC LEAD LOCATION: 753860
MDC IDC MSMT BATTERY REMAINING LONGEVITY: 37 mo
MDC IDC MSMT BATTERY VOLTAGE: 2.95 V
MDC IDC MSMT LEADCHNL LV IMPEDANCE VALUE: 475 Ohm
MDC IDC MSMT LEADCHNL LV IMPEDANCE VALUE: 608 Ohm
MDC IDC MSMT LEADCHNL LV IMPEDANCE VALUE: 722 Ohm
MDC IDC MSMT LEADCHNL LV IMPEDANCE VALUE: 760 Ohm
MDC IDC MSMT LEADCHNL LV PACING THRESHOLD AMPLITUDE: 1 V
MDC IDC MSMT LEADCHNL LV PACING THRESHOLD PULSEWIDTH: 0.4 ms
MDC IDC MSMT LEADCHNL RA PACING THRESHOLD PULSEWIDTH: 0.4 ms
MDC IDC MSMT LEADCHNL RA SENSING INTR AMPL: 3.5 mV
MDC IDC MSMT LEADCHNL RV IMPEDANCE VALUE: 475 Ohm
MDC IDC MSMT LEADCHNL RV SENSING INTR AMPL: 20 mV
MDC IDC PG IMPLANT DT: 20160217
MDC IDC SESS DTM: 20190910084222
MDC IDC SET LEADCHNL LV PACING AMPLITUDE: 2 V
MDC IDC SET LEADCHNL LV PACING PULSEWIDTH: 0.4 ms
MDC IDC SET LEADCHNL RV PACING PULSEWIDTH: 0.4 ms
MDC IDC STAT BRADY AS VS PERCENT: 1.25 %
MDC IDC STAT BRADY RV PERCENT PACED: 93.58 %

## 2017-12-21 ENCOUNTER — Other Ambulatory Visit: Payer: Self-pay | Admitting: Internal Medicine

## 2017-12-25 ENCOUNTER — Other Ambulatory Visit: Payer: Self-pay | Admitting: Internal Medicine

## 2017-12-25 DIAGNOSIS — R110 Nausea: Secondary | ICD-10-CM

## 2017-12-28 NOTE — Telephone Encounter (Signed)
Refilled: 02/01/2017 Last OV: 03/24/2017 Next OV: not scheduled

## 2017-12-28 NOTE — Telephone Encounter (Signed)
Needs cmet before I can refill

## 2018-01-03 ENCOUNTER — Ambulatory Visit: Payer: Medicare Other | Attending: Neurology

## 2018-01-03 DIAGNOSIS — M6281 Muscle weakness (generalized): Secondary | ICD-10-CM | POA: Diagnosis not present

## 2018-01-03 DIAGNOSIS — R2681 Unsteadiness on feet: Secondary | ICD-10-CM | POA: Diagnosis not present

## 2018-01-03 NOTE — Therapy (Addendum)
[place] [facility] SERVICES [address] [place], [place], [address] Phone: [phone]   Fax:  [phone]  Physical Therapy Evaluation  Patient Details  Name: [name] MRN: [id number] Date of Birth: 12-23-1940 Referring Provider (PT): Dr. Manuella Ghazi   Encounter Date: 01/03/2018  PT End of Session - 01/03/18 1439    Visit Number  1    Number of Visits  13    Date for PT Re-Evaluation  02/14/18    Authorization Type  Progress note 1/10, last goals: 01/03/18    PT Start Time  1345    PT Stop Time  1440    PT Time Calculation (min)  55 min    Equipment Utilized During Treatment  Gait belt    Activity Tolerance  Patient tolerated treatment well    Behavior During Therapy  [facility] for tasks assessed/performed       Past Medical History:  Diagnosis Date  . 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 Arthritis    "in about all my joints; for sure in my back"  . Bladder cancer [facility]) urologist-  dr Junious Silk   dx 1995--  recurrent bladder cancer 2015 , s/p TURBT's and chemo instillation's  . Chronic hyponatremia   . Chronic lower back pain   . Chronic systolic (congestive) heart failure [facility])    cardiologist-  dr Rogue Jury  . COPD with emphysema ([place])   . Coronary artery disease cardiologist-  dr Kathlyn [place]   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% per cardiac cath 03-03-2014  . DDD (degenerative disc disease), thoracolumbar   . Ectopic atrial beats    2015  alternating BBB  . Frequent urination   . Full dentures   . Gait instability    due to chronic low back pain, uses roller walker  . GERD (gastroesophageal reflux disease)   . History of iron deficiency anemia 11/2013   resolved w/ IV Iron  . History of stomach ulcers 11/2013  . Hypertension   . LBBB (left bundle branch block)   . NICM (nonischemic cardiomyopathy) ([place]) last echo 12-31-2015 ef  55-60%   dx 09/ 2015 per echo 20%;  myoview 09/ 2015 ef 28%;  per cardiac cath 12/ 2015 ef 30%;    . Nocturia more than twice per night   . Scoliosis     Past Surgical History:  Procedure Laterality Date  . BI-VENTRICULAR IMPLANTABLE CARDIOVERTER DEFIBRILLATOR N/A 05/07/2014   Procedure: BI-VENTRICULAR IMPLANTABLE CARDIOVERTER DEFIBRILLATOR  (CRT-D);  Surgeon: Deboraha Sprang, MD;  [facility] C[facility] LAB;  Service: Cardiovascular;  Laterality: N/A;  . CARDIAC CATHETERIZATION  03-03-2014  dr Kathlyn [place]   [facility]   pLAD 20%, pRCA 20%, dRCA 50%, RPLS 50%;  ef 30%, mild elevated LVEDP, mild gradient across aortic valve LVOT  . CARDIOVASCULAR STRESS TEST  11/25/2013   High risk nuclear study w/ large high severity inferior wall perfusion defect on stress and rest images, large mild severity anteroseptal wall perfusion defect on stress and rest images, No inducible ischemia/ global moderate hypokinesis, ef 28%  . CATARACT EXTRACTION W/ INTRAOCULAR LENS  IMPLANT, BILATERAL Bilateral right 12-2013 / left  02-2014  . CYSTOSCOPY W/ RETROGRADES Bilateral 01/06/2015   Procedure: CYSTOSCOPY WITH  BLADDER BIOPSY BILATERAL RETROGRADE PYELOGRAM,INSTILLATION OF MITOMYCIN C;  Surgeon: [name], MD;  Location: [facility];  Service: Urology;  Laterality: Bilateral;  . CYSTOSCOPY WITH  BIOPSY N/A 10/06/2015   Procedure: CYSTO WITH BLADDER BIOPSY, FULGERATION, CHEMO IRRIGATION EPIRUBICIN IN PACU;  Surgeon: [name], MD;  Location: [facility];  Service: Urology;  Laterality: N/A;  . CYSTOSCOPY WITH FULGERATION N/A 06/30/2017   Procedure: Marland Kitchen CYSTOSCOPY WITH [place] FULGERATION/ BLADDER BIOPSY/ INSTILLATION OF EPIRUBICIN;  Surgeon: [name], MD;  Location: [facility];  Service: Urology;  Laterality: N/A;  . CYSTOSCOPY WITH RETROGRADE PYELOGRAM, URETEROSCOPY AND STENT PLACEMENT Bilateral 04/18/2014   Procedure: CYSTOSCOPY WITH RETROGRADE PYELOGRAM;  Surgeon: [name], MD;   Location: [facility];  Service: Urology;  Laterality: Bilateral;  . ESOPHAGOGASTRODUODENOSCOPY N/A 11/28/2013   Procedure: ESOPHAGOGASTRODUODENOSCOPY (EGD);  Surgeon: Arta Silence, MD;  Location: [facility] ENDOSCOPY;  Service: Endoscopy;  Laterality: N/A;  . LUMBAR [place]  1980's   "ruptured disc"  . TRANSTHORACIC ECHOCARDIOGRAM  12/31/2015   dr Caryl Comes   ef 40-347, grade 1 diastolic dysfunction/ mild MR/ septal motion showed abnormal function and dyssynergy  . TRANSURETHRAL RESECTION OF BLADDER  1995  . TRANSURETHRAL RESECTION OF BLADDER TUMOR N/A 12/13/2013   Procedure: TRANSURETHRAL RESECTION OF BLADDER TUMOR (TURBT);  Surgeon: [name], MD;  Location: [facility];  Service: Urology;  Laterality: N/A;  . TRANSURETHRAL RESECTION OF BLADDER TUMOR N/A 01/17/2014   Procedure: TRANSURETHRAL RESECTION OF BLADDER TUMOR (TURBT);  Surgeon: [name], MD;  Location: [facility];  Service: Urology;  Laterality: N/A;  . TRANSURETHRAL RESECTION OF BLADDER TUMOR N/A 04/18/2014   Procedure: TRANSURETHRAL RESECTION OF BLADDER TUMOR (TURBT), CYSTOGRAM;  Surgeon: [name], MD;  Location: [facility];  Service: Urology;  Laterality: N/A;    There were no vitals filed for this visit.   Subjective Assessment - 01/03/18 1451    Subjective  Imbalance    Pertinent History  "I have trouble with my balance." Pt reports that difficulty with balance started in 2014 and has been slowly worsening. She denies any tremors but does state that she has trouble writing. States that she has a hard time controlling the pen. Her medical record indicates history of parkinson's like symptoms. She states that she is unable to vacuum or perform housekeeping secondary to imbalance. She feels like her legs are going to buckle on her. She states that her toes are beginning to curl under on her right foot. Pt had a brain CT in 12/2016 without any notable findings. Lumbar CT from 05/2016 showed multilevel spondylosis. PMH includes cervical,  thoracic, and lumbar spinal stenosis. She is unable to have an MRI due to AICD. She also has a history or progressive back pack which started in 2015. She has seen two neurosurgeons and they stated that her back problem may require major surgery but they didn't feel like she was a good surgical candidate. She has been seeing Dr. Holley Raring at the pain clinic and he has referred her to another neurosurgeon. Her appointment with neurosurgery is scheduled for Friday.     Limitations  Walking    Diagnostic tests  Lumbar and head CT (see history). Unable to have MRI secondary to AICD    Patient Stated Goals  Improve balance and leg strength. Decrease fall risk    Currently in Pain?  No/denies   Not related to her current episode       SUBJECTIVE Chief complaint: "I have trouble with my balance." Pt reports that difficulty with balance started in 2014 and has been slowly worsening. She denies any tremors but does state that she has trouble writing. States that she has  a hard time controlling the pen. Her medical record indicates history of parkinson's like symptoms. She states that she is unable to vacuum or perform housekeeping secondary to imbalance. She feels like her legs are going to buckle on her. She states that her toes are beginning to curl under on her right foot. Pt had a brain CT in 12/2016 without any notable findings. Lumbar CT from 05/2016 showed multilevel spondylosis. PMH includes cervical, thoracic, and lumbar spinal stenosis. She is unable to have an MRI due to AICD. She also has a history or progressive back pack which started in 2015. She has seen two neurosurgeons and they stated that her back problem may require major surgery but they didn't feel like she was a good surgical candidate. She has been seeing Dr. Holley Raring at the pain clinic and he has referred her to another neurosurgeon. Her appointment with neurosurgery is scheduled for Friday.  Onset: 2014 Imaging: Lumbar CT and Head CT, unable  to have brain MRI secondary to AICD Recent changes in overall health/medication: No Directional pattern for falls: No falls  Prior history of physical therapy for balance: Yes in 2017?, no improvement in her balance but pt states that it was only a short bout of therapy.  Follow-up appointment with MD: Dr. Manuella Ghazi 01/17/18 Red flags (bowel/bladder changes, saddle paresthesia, personal history of cancer, chills/fever, night sweats, unrelenting pain) Negative with the exception of history of bladder cancer in 2015  OBJECTIVE  MUSCULOSKELETAL: Tremor: Absent Bulk: Pt appears to have decreased muscle mass in UE/LE Tone: Normal, no clonus  Posture Forward head and rounded shoulders  Gait Decreased speed and decreased bilateral dorsiflexion. Use of rollator for ambulation. Good engagement and disengagement of breaks  Strength R/L 5/5 Hip flexion 5/5 Knee extension 4+/4+ Knee flexion 3+/4- Ankle Dorsiflexion   UE strength is grossly 4+ to 5/5 without focal weakness noted in shoulder flex/abd, elbow flex/ext, wrist flex/ext, and grip strength  NEUROLOGICAL:  Mental Status Patient is oriented to person, place and time.  Recent memory is intact.  Remote memory is intact.  Attention span and concentration are intact.  Expressive speech is intact.  Patient's fund of knowledge is within normal limits for educational level.   Sensation Grossly intact to light touch bilateral UEs/LEs as determined by testing dermatomes C2-T2/L2-S2 respectively Proprioception and hot/cold testing deferred on this date  Reflexes Deferred  Coordination/Cerebellar Finger to Nose: Mildly abnormal LUE Heel to Shin: WNL Rapid alternating movements: WNL Finger Opposition: WNL Pronator Drift: Negative  FUNCTIONAL OUTCOME MEASURES   Results Comments  BERG 11/56 High fall risk, in need of intervention  DGI    FGA    TUG 16.3 seconds Fall risk, in need of intervention  5TSTS 33 seconds Fall risk, in  need of intervention  10 Meter Gait Speed Self-selected: 19.2s = 0.52 m/s; Fastest: 15.3s = 0.65 m/s, tested with use of rollator Below normative values for full community ambulation  ABC Scale 39.4% Low balance confidence  DHI    6 Minute Walk Test    mCTSIB      OCULOMOTOR / VESTIBULAR TESTING:  Oculomotor Exam- Room Light  Normal Abnormal Comments  Ocular Alignment N    Ocular ROM N    Spontaneous Nystagmus N    End-Gaze Nystagmus N    Smooth Pursuit  A Saccadic smooth pursuit but no nystagmus noted  Saccades     VOR     VOR Cancellation     Left Head Thrust  Right Head Thrust     Head Shaking Nystagmus     Static Acuity     Dynamic Acuity            [facility] PT Assessment - 01/03/18 1417      Assessment   Medical Diagnosis  Imbalance    Referring Provider (PT)  Dr. Manuella Ghazi    Onset Date/Surgical Date  03/21/12   Approximate   Hand Dominance  Right    Next MD Visit  01/17/18 with Dr. Manuella Ghazi    Prior Therapy  Yes      Precautions   Precautions  Fall      Restrictions   Weight Bearing Restrictions  No      Balance Screen   Has the patient fallen in the past 6 months  No    Has the patient had a decrease in activity level because of a fear of falling?   No    Is the patient reluctant to leave their home because of a fear of falling?   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     Prior Function   Level of Independence  Other (comment);Independent with basic ADLs;Independent with household mobility with device;Needs assistance with homemaking    Leisure  Used to [employer], she likes to read      Cognition   Overall Cognitive Status  Within Functional  Limits for tasks assessed      Observation/Other Assessments   Other Surveys   Other Surveys    Activities of Balance Confidence Scale (ABC Scale)   39.4s      Standardized Balance Assessment   Standardized Balance Assessment  Berg Balance Test;Timed Up and Go Test;Five Times Sit to Stand;10 meter walk test    Five times sit to stand comments   33 seconds    10 Meter Walk  Self-selected: 19.2s = 0.52 m/s; Fastest: 15.3s = 0.65 m/s, tested with use of rollator      Berg Balance Test   Sit to Stand  Needs minimal aid to stand or to stabilize    Standing Unsupported  Unable to stand 30 seconds unassisted    Sitting with Back Unsupported but Feet Supported on Floor or Stool  Able to sit safely and securely 2 minutes    Stand to Sit  Controls descent by using hands    Transfers  Able to transfer safely, definite need of hands    Standing Unsupported with Eyes Closed  Needs help to keep from falling    Standing Ubsupported with Feet Together  Needs help to attain position and unable to hold for 15 seconds    From Standing, Reach Forward with Outstretched Arm  Loses balance while trying/requires external support    From Standing Position, Pick up Object from Floor  Unable to try/needs assist to keep balance    From Standing Position, Turn to Look Behind Over each Shoulder  Needs assist to keep from losing balance and falling    Turn 360 Degrees  Needs assistance while turning    Standing Unsupported,  Alternately Place Feet on Step/Stool  Needs assistance to keep from falling or unable to try    Standing Unsupported, One Foot in [employer] balance while stepping or standing    Standing on One Leg  Unable to try or needs assist to prevent fall    Total Score  11      Timed Up and Go Test   TUG  Normal TUG    Normal TUG (seconds)  16.3                Objective measurements completed on examination: See above findings.    TREATMENT  Neuromuscular Re-education  Feet together  eyes closed 30s x 2; Hooklying bridges 2 x 10; Pt provided written HEP with extensive instructions about how to perform exercises safely.           PT Education - 01/03/18 1439    Education Details  Plan of care and HEP    Person(s) Educated  Patient    Methods  Explanation;Demonstration;Handout    Comprehension  Verbalized understanding       PT Short Term Goals - 01/03/18 1502      PT SHORT TERM GOAL #1   Title  Pt will be independent with HEP in order to improve strength and balance in order to decrease fall risk and improve function at home and work.     Time  3    Period  Weeks    Status  New    Target Date  01/24/18        PT Long Term Goals - 01/03/18 1503      PT LONG TERM GOAL #1   Title  Pt will improve BERG by at least 3 points in order to demonstrate clinically significant improvement in balance.    Baseline  01/03/18: 11/56    Time  6    Period  Weeks    Status  New    Target Date  02/14/18      PT LONG TERM GOAL #2   Title  Pt will improve ABC by at least 13% in order to demonstrate clinically significant improvement in balance confidence.     Baseline  01/03/18: 39.4%    Time  6    Period  Weeks    Status  New    Target Date  02/14/18      PT LONG TERM GOAL #3   Title  Pt will decrease 5TSTS by at least 3 seconds in order to demonstrate clinically significant improvement in LE strength.    Baseline  01/03/18: 33 seconds    Time  6    Period  Weeks    Status  New    Target Date  02/14/18      PT LONG TERM GOAL #4   Title  Pt will decrease TUG to below 14 seconds/decrease 23% in order to demonstrate decreased fall risk.    Baseline  01/03/18: 16.3 seconds    Time  6    Period  Weeks    Status  New    Target Date  02/14/18             Plan - 01/03/18 1440    Clinical Impression Statement  Pt is a pleasant 77 year-old female referred for difficulty with balance. PT examination reveals significant deficits in balance with patient  scoring 11/56 on the BERG. TUG is outside of normative ranges at 16.3 seconds and Five Time Sit to Stand is  also abnormal at 33 seconds. Self-selected and fastest gait speed are below functional levels for full community ambulation. Low balance confidence with ABC of 39.4%. Bilateral ankle dorsiflexion noted. Pt presents with deficits in strength, gait and balance. Pt will benefit from skilled PT services to address deficits in balance and decrease risk for future falls.     History and Personal Factors relevant to plan of care:  Low: no personal factors/comorbidities, 1-2 body systems/activity limitations/participation restrictions     Clinical Presentation  Unstable    Clinical Decision Making  Low    Rehab Potential  Fair    PT Frequency  2x / week    PT Duration  6 weeks    PT Treatment/Interventions  ADLs/Self Care Home Management;Aquatic Therapy;Canalith Repostioning;Cryotherapy;Electrical Stimulation;Iontophoresis 4mg /ml Dexamethasone;Moist Heat;Traction;Ultrasound;DME Instruction;Gait training;Stair training;Functional mobility training;Therapeutic activities;Therapeutic exercise;Balance training;Neuromuscular re-education;Cognitive remediation;Patient/family education;Manual techniques;Dry needling;Energy conservation;Vestibular;Visual/perceptual remediation/compensation    PT Next Visit Plan  Progress balance and strengthening, progress HEP as pt is able    PT Home Exercise Plan  Hooklying bridges 2 x 10 BID, Feet together EO balance 30s x 3 BID    Consulted and Agree with Plan of Care  Patient       Patient will benefit from skilled therapeutic intervention in order to improve the following deficits and impairments:  Abnormal gait, Decreased balance, Decreased strength, Pain  Visit Diagnosis: Unsteadiness on feet - Plan: PT plan of care cert/re-cert  Muscle weakness (generalized) - Plan: PT plan of care cert/re-cert     Problem List Patient Active Problem List   Diagnosis Date  Noted  . Lumbar spondylosis 07/11/2017  . SI joint arthritis 07/11/2017  . Lumbar degenerative disc disease 07/11/2017  . Hyperlipidemia 03/26/2017  . S/P placement of cardiac pacemaker 03/26/2017  . Coronary artery disease 03/26/2017  . 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 Hypertension 09/06/2016  . Spinal stenosis of lumbar region without neurogenic claudication 08/18/2016  . Other idiopathic scoliosis, lumbar region 08/18/2016  . NSAID induced gastritis 05/26/2016  . Chronic bilateral low back pain 05/15/2016  . Spinal stenosis in cervical region 01/19/2014  . Constipation 12/29/2013  . Spinal stenosis of thoracolumbar region 12/27/2013  . COPD (chronic obstructive pulmonary disease) ([place]) 12/27/2013  . History of gastric ulcer 11/28/2013  . Congestive dilated cardiomyopathy ([place]) 11/25/2013  . Bladder cancer ([place]) 11/24/2013  . LBBB (left bundle branch block) 11/24/2013  . Atrial ectopy 11/24/2013  . Palpitations 11/22/2013  . Hyponatremia 11/22/2013   Phillips Grout PT, DPT, GCS  Huprich,Jason 01/03/2018, 3:08 PM  [place] [facility]IN [facility] SERVICES [address] [place], [place], [address] Phone: [phone]   Fax:  [phone]  Name: [name] MRN: [id number] Date of Birth: [birth year]

## 2018-01-03 NOTE — Patient Instructions (Signed)
Access Code: [email or web address]  URL: [email or web address]  Date: 01/03/2018  Prepared by: Roxana Hires   Exercises  Supine Bridge - 10 reps - 2 sets - 3 seconds hold - 2x daily - 7x weekly  Romberg Stance - 3 reps - 30 seconds hold - 2x daily - 7x weekly

## 2018-01-05 DIAGNOSIS — M5442 Lumbago with sciatica, left side: Secondary | ICD-10-CM | POA: Diagnosis not present

## 2018-01-05 DIAGNOSIS — G8929 Other chronic pain: Secondary | ICD-10-CM | POA: Diagnosis not present

## 2018-01-05 DIAGNOSIS — M5441 Lumbago with sciatica, right side: Secondary | ICD-10-CM | POA: Diagnosis not present

## 2018-01-09 ENCOUNTER — Ambulatory Visit: Payer: Medicare Other | Admitting: Physical Therapy

## 2018-01-10 ENCOUNTER — Telehealth: Payer: Self-pay | Admitting: Student in an Organized Health Care Education/Training Program

## 2018-01-10 DIAGNOSIS — M5416 Radiculopathy, lumbar region: Secondary | ICD-10-CM

## 2018-01-10 NOTE — Telephone Encounter (Signed)
Patient called stating she saw Dr. Rosine Door but they want Dr. Eduard Clos to have a Psycho Analysis before they can do anything further.

## 2018-01-11 ENCOUNTER — Ambulatory Visit: Payer: Medicare Other | Admitting: Physical Therapy

## 2018-01-11 ENCOUNTER — Encounter: Payer: Self-pay | Admitting: Physical Therapy

## 2018-01-11 DIAGNOSIS — M6281 Muscle weakness (generalized): Secondary | ICD-10-CM | POA: Diagnosis not present

## 2018-01-11 DIAGNOSIS — R2681 Unsteadiness on feet: Secondary | ICD-10-CM | POA: Diagnosis not present

## 2018-01-11 NOTE — Therapy (Signed)
[place] MAIN [facility] SERVICES [address] [place], [place], [address] Phone: [phone]   Fax:  [phone]  Physical Therapy Treatment  Patient Details  Name: [name] MRN: [id number] Date of Birth: [birth year] Referring Provider (PT): Dr. Manuella Ghazi   Encounter Date: 01/11/2018  PT End of Session - 01/11/18 1550    Visit Number  2    Number of Visits  13    Date for PT Re-Evaluation  02/14/18    Authorization Type  Progress note 2/10, last goals: 01/03/18    Equipment Utilized During Treatment  Gait belt    Activity Tolerance  Patient tolerated treatment well    Behavior During Therapy  [facility] for tasks assessed/performed       Past Medical History:  Diagnosis Date  . 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 Arthritis    "in about all my joints; for sure in my back"  . Bladder cancer [facility]) urologist-  dr Junious Silk   dx 1995--  recurrent bladder cancer 2015 , s/p TURBT's and chemo instillation's  . Chronic hyponatremia   . Chronic lower back pain   . Chronic systolic (congestive) heart failure [facility])    cardiologist-  dr Rogue Jury  . COPD with emphysema ([place])   . Coronary artery disease cardiologist-  dr Kathlyn [place]   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% per cardiac cath 03-03-2014  . DDD (degenerative disc disease), thoracolumbar   . Ectopic atrial beats    2015  alternating BBB  . Frequent urination   . Full dentures   . Gait instability    due to chronic low back pain, uses roller walker  . GERD (gastroesophageal reflux disease)   . History of iron deficiency anemia 11/2013   resolved w/ IV Iron  . History of stomach ulcers 11/2013  . Hypertension   . LBBB (left bundle branch block)   . NICM (nonischemic cardiomyopathy) ([place]) last echo 12-31-2015 ef 55-60%   dx 09/ 2015 per echo 20%;  myoview 09/ 2015 ef 28%;  per cardiac cath 12/ 2015 ef  30%;    . Nocturia more than twice per night   . Scoliosis     Past Surgical History:  Procedure Laterality Date  . BI-VENTRICULAR IMPLANTABLE CARDIOVERTER DEFIBRILLATOR N/A 05/07/2014   Procedure: BI-VENTRICULAR IMPLANTABLE CARDIOVERTER DEFIBRILLATOR  (CRT-D);  Surgeon: Deboraha Sprang, MD;  Location: [facility] CATH LAB;  Service: Cardiovascular;  Laterality: N/A;  . CARDIAC CATHETERIZATION  03-03-2014  dr Kathlyn [place]   [facility]   pLAD 20%, pRCA 20%, dRCA 50%, RPLS 50%;  ef 30%, mild elevated LVEDP, mild gradient across aortic valve LVOT  . CARDIOVASCULAR STRESS TEST  11/25/2013   High risk nuclear study w/ large high severity inferior wall perfusion defect on stress and rest images, large mild severity anteroseptal wall perfusion defect on stress and rest images, No inducible ischemia/ global moderate hypokinesis, ef 28%  . CATARACT EXTRACTION W/ INTRAOCULAR LENS  IMPLANT, BILATERAL Bilateral right 12-2013 / left  02-2014  . CYSTOSCOPY W/ RETROGRADES Bilateral 01/06/2015   Procedure: CYSTOSCOPY WITH  BLADDER BIOPSY BILATERAL RETROGRADE PYELOGRAM,INSTILLATION OF MITOMYCIN C;  Surgeon: [name], MD;  Location: [facility] ORS;  Service: Urology;  Laterality: Bilateral;  . CYSTOSCOPY WITH BIOPSY N/A 10/06/2015   Procedure: CYSTO WITH BLADDER BIOPSY, FULGERATION, CHEMO IRRIGATION EPIRUBICIN IN PACU;  Surgeon: [name], MD;  Location: [facility] ORS;  Service: Urology;  Laterality: N/A;  . CYSTOSCOPY WITH FULGERATION N/A 06/30/2017   Procedure: Marland Kitchen CYSTOSCOPY WITH [place] FULGERATION/ BLADDER BIOPSY/ INSTILLATION OF EPIRUBICIN;  Surgeon: [name], MD;  Location: [facility];  Service: Urology;  Laterality: N/A;  . CYSTOSCOPY WITH RETROGRADE PYELOGRAM, URETEROSCOPY AND STENT PLACEMENT Bilateral 04/18/2014   Procedure: CYSTOSCOPY WITH RETROGRADE PYELOGRAM;  Surgeon: [name], MD;  Location: [facility];  Service: Urology;  Laterality: Bilateral;  . ESOPHAGOGASTRODUODENOSCOPY N/A  11/28/2013   Procedure: ESOPHAGOGASTRODUODENOSCOPY (EGD);  Surgeon: Arta Silence, MD;  Location: [facility] ENDOSCOPY;  Service: Endoscopy;  Laterality: N/A;  . LUMBAR [place]  1980's   "ruptured disc"  . TRANSTHORACIC ECHOCARDIOGRAM  12/31/2015   dr Caryl Comes   ef 90-240, grade 1 diastolic dysfunction/ mild MR/ septal motion showed abnormal function and dyssynergy  . TRANSURETHRAL RESECTION OF BLADDER  1995  . TRANSURETHRAL RESECTION OF BLADDER TUMOR N/A 12/13/2013   Procedure: TRANSURETHRAL RESECTION OF BLADDER TUMOR (TURBT);  Surgeon: [name], MD;  Location: [facility];  Service: Urology;  Laterality: N/A;  . TRANSURETHRAL RESECTION OF BLADDER TUMOR N/A 01/17/2014   Procedure: TRANSURETHRAL RESECTION OF BLADDER TUMOR (TURBT);  Surgeon: [name], MD;  Location: [facility];  Service: Urology;  Laterality: N/A;  . TRANSURETHRAL RESECTION OF BLADDER TUMOR N/A 04/18/2014   Procedure: TRANSURETHRAL RESECTION OF BLADDER TUMOR (TURBT), CYSTOGRAM;  Surgeon: [name], MD;  Location: [facility];  Service: Urology;  Laterality: N/A;    There were no vitals filed for this visit.  Subjective Assessment - 01/11/18 1548    Subjective  Patient reports that she is having back pain today that is 6/10  . She wants to work  on her balance.    Pertinent History  "I have trouble with my balance." Pt reports that difficulty with balance started in 2014 and has been slowly worsening. She denies any tremors but does state that she has trouble writing. States that she has a hard time controlling the pen. Her medical record indicates history of parkinson's like symptoms. She states that she is unable to vacuum or perform housekeeping secondary to imbalance. She feels like her legs are going to buckle on her. She states that her toes are beginning to curl under on her right foot. Pt had a brain CT in 12/2016 without any notable findings. Lumbar CT from 05/2016 showed multilevel spondylosis. PMH includes cervical,  thoracic, and lumbar spinal stenosis. She is unable to have an MRI due to AICD. She also has a history or progressive back pack which started in 2015. She has seen two neurosurgeons and they stated that her back problem may require major surgery but they didn't feel like she was a good surgical candidate. She has been seeing Dr. Holley Raring at the pain clinic and he has referred her to another neurosurgeon. Her appointment with neurosurgery is scheduled for Friday.     Limitations  Walking    Diagnostic tests  Lumbar and head CT (see history). Unable to have MRI secondary to AICD    Patient Stated Goals  Improve balance and leg strength. Decrease fall risk        Therapeutic exercise:   nustep level 3 for warm up (unbilled) x52mins  Tandem standing in parallel bars x 2 mins with each leg leading   Standing on foam 75 % UE support on parallel bars   Stepping over 2 hurdle fwd side to side x 10   Stepping over 1 hurdle bwd x10 cues for posture,  increased hip flexion   Standing hip abd without resistance due to weakness 2x5 bilaterally   Standing hip extension without GTB due to increased fatigue today 2x5 BLE      Cues for core activation of TA in standing to reduce back pain and for improved posture. Patient needed several sitting rest breaks during session.                        PT Education - 01/11/18 1549    Education Details  safety , HEP       PT Short Term Goals - 01/03/18 1502      PT SHORT TERM GOAL #1   Title  Pt will be independent with HEP in order to improve strength and balance in order to decrease fall risk and improve function at home and work.     Time  3    Period  Weeks    Status  New    Target Date  01/24/18        PT Long Term Goals - 01/03/18 1503      PT LONG TERM GOAL #1   Title  Pt will improve BERG by at least 3 points in order to demonstrate clinically significant improvement in balance.    Baseline  01/03/18: 11/56    Time  6     Period  Weeks    Status  New    Target Date  02/14/18      PT LONG TERM GOAL #2   Title  Pt will improve ABC by at least 13% in order to demonstrate clinically significant improvement in balance confidence.     Baseline  01/03/18: 39.4%    Time  6    Period  Weeks    Status  New    Target Date  02/14/18      PT LONG TERM GOAL #3   Title  Pt will decrease 5TSTS by at least 3 seconds in order to demonstrate clinically significant improvement in LE strength.    Baseline  01/03/18: 33 seconds    Time  6    Period  Weeks    Status  New    Target Date  02/14/18      PT LONG TERM GOAL #4   Title  Pt will decrease TUG to below 14 seconds/decrease 23% in order to demonstrate decreased fall risk.    Baseline  01/03/18: 16.3 seconds    Time  6    Period  Weeks    Status  New    Target Date  02/14/18            Plan - 01/11/18 1550    Clinical Impression Statement  Pt presents with flexed posture and decreased standing tolerance and LE weakness and requires frequent verbal cues for upright posture and education provided on relationship between posture and back pain. Patient demonstrates instability with therapeutic exercises and balance exercises but tolerates all interventions well. Patient will benefit from continued skilled PT interventions for improved balance, posture, strength, and QOL.    Rehab Potential  Fair    PT Frequency  2x / week    PT Duration  6 weeks    PT Treatment/Interventions  ADLs/Self Care Home Management;Aquatic Therapy;Canalith Repostioning;Cryotherapy;Electrical Stimulation;Iontophoresis 4mg /ml Dexamethasone;Moist Heat;Traction;Ultrasound;DME Instruction;Gait training;Stair training;Functional mobility training;Therapeutic activities;Therapeutic exercise;Balance training;Neuromuscular re-education;Cognitive remediation;Patient/family education;Manual techniques;Dry needling;Energy conservation;Vestibular;Visual/perceptual remediation/compensation    PT Next  Visit Plan  Progress balance and strengthening, progress HEP as pt  is able    PT Home Exercise Plan  Hooklying bridges 2 x 10 BID, Feet together EO balance 30s x 3 BID    Consulted and Agree with Plan of Care  Patient       Patient will benefit from skilled therapeutic intervention in order to improve the following deficits and impairments:  Abnormal gait, Decreased balance, Decreased strength, Pain  Visit Diagnosis: Unsteadiness on feet  Muscle weakness (generalized)     Problem List Patient Active Problem List   Diagnosis Date Noted  . Lumbar spondylosis 07/11/2017  . SI joint arthritis 07/11/2017  . Lumbar degenerative disc disease 07/11/2017  . Hyperlipidemia 03/26/2017  . S/P placement of cardiac pacemaker 03/26/2017  . Coronary artery disease 03/26/2017  . 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 Hypertension 09/06/2016  . Spinal stenosis of lumbar region without neurogenic claudication 08/18/2016  . Other idiopathic scoliosis, lumbar region 08/18/2016  . NSAID induced gastritis 05/26/2016  . Chronic bilateral low back pain 05/15/2016  . Spinal stenosis in cervical region 01/19/2014  . Constipation 12/29/2013  . Spinal stenosis of thoracolumbar region 12/27/2013  . COPD (chronic obstructive pulmonary disease) ([place]) 12/27/2013  . History of gastric ulcer 11/28/2013  . Congestive dilated cardiomyopathy ([place]) 11/25/2013  . Bladder cancer ([place]) 11/24/2013  . LBBB (left bundle branch block) 11/24/2013  . Atrial ectopy 11/24/2013  . Palpitations 11/22/2013  . Hyponatremia 11/22/2013    Alanson Puls, PT DPT 01/11/2018, 3:51 PM  [place] MAIN [facility] SERVICES [address] [place], [place], [address] Phone: [phone]   Fax:  [phone]  Name: MAYELA BULLARD MRN: [id number] Date of Birth: February 27, 1941

## 2018-01-11 NOTE — Patient Instructions (Signed)
Tandem Stance    Right foot in front of left, heel touching toe both feet "straight ahead".t. Balance in this position _30 sec x 2     Walk to left side with eyes open. Take even steps, leading with same foot. Make sure each foot lifts off the floor. Repeat in opposite direction. Repeat for __1__ minutes per session. Do __2__ sessions per day. .  Copyright  VHI. All rights reserved.  _.  Copyright  VHI. All rights reserved.

## 2018-01-11 NOTE — Telephone Encounter (Signed)
I dont do the check out, so I will send to [place]

## 2018-01-11 NOTE — Telephone Encounter (Signed)
Blanch Media is this the psych referral that you do for Dr. Holley Raring?

## 2018-01-11 NOTE — Telephone Encounter (Signed)
Please call patient and let her know that I have placed referral for Dr Sima Matas (Psychology) in [place] for Psych evaluation for SCS trial. To be clear, this has been requested by Dr Rosine Door at [facility] prior to consider spinal cord stimulation/neuromodulation candidacy. Please inform Ms Dumais that she must fax/send this evaluation to Dr Rosine Door. Please help coordinate her referral with Dr Sima Matas (Psych) who is with the [place]. Thank you.  Orders Placed This Encounter  Procedures  . Ambulatory referral to Psychology    Referral Priority:   Routine    Referral Type:   Psychiatric    Referral Reason:   Specialty Services Required    Referred to Provider:   Edgardo Roys, PsyD    Requested Specialty:   Psychology    Number of Visits Requested:   1

## 2018-01-12 ENCOUNTER — Ambulatory Visit: Payer: Medicare Other

## 2018-01-12 NOTE — Telephone Encounter (Signed)
This is psych referral for SCS with Dr. Sima Matas, I though you scheduled those, sorry.

## 2018-01-15 ENCOUNTER — Ambulatory Visit: Payer: Medicare Other

## 2018-01-16 ENCOUNTER — Ambulatory Visit: Payer: Medicare Other | Admitting: Physical Therapy

## 2018-01-17 ENCOUNTER — Ambulatory Visit: Payer: Medicare Other | Admitting: Physical Therapy

## 2018-01-17 ENCOUNTER — Encounter: Payer: Self-pay | Admitting: Physical Therapy

## 2018-01-17 DIAGNOSIS — R2681 Unsteadiness on feet: Secondary | ICD-10-CM | POA: Diagnosis not present

## 2018-01-17 DIAGNOSIS — M6281 Muscle weakness (generalized): Secondary | ICD-10-CM

## 2018-01-17 NOTE — Therapy (Addendum)
[place] MAIN [facility] SERVICES [address] [place], [place], [address] Phone: [phone]   Fax:  [phone]  Physical Therapy Treatment  Patient Details  Name: [name] MRN: [id number] Date of Birth: July 01, 1940 Referring Provider (PT): Dr. Manuella Ghazi   Encounter Date: 01/17/2018  PT End of Session - 01/17/18 1443    Visit Number  3   Number of Visits  13    Date for PT Re-Evaluation  02/14/18    Authorization Type  Progress note 3/10, last goals: 01/03/18    PT Start Time  0233    PT Stop Time  0314    PT Time Calculation (min)  41 min    Equipment Utilized During Treatment  Gait belt    Activity Tolerance  Patient tolerated treatment well    Behavior During Therapy  [facility] for tasks assessed/performed       Past Medical History:  Diagnosis Date  . 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 Arthritis    "in about all my joints; for sure in my back"  . Bladder cancer [facility]) urologist-  dr Junious Silk   dx 1995--  recurrent bladder cancer 2015 , s/p TURBT's and chemo instillation's  . Chronic hyponatremia   . Chronic lower back pain   . Chronic systolic (congestive) heart failure [facility])    cardiologist-  dr Rogue Jury  . COPD with emphysema ([place])   . Coronary artery disease cardiologist-  dr Kathlyn [place]   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% per cardiac cath 03-03-2014  . DDD (degenerative disc disease), thoracolumbar   . Ectopic atrial beats    2015  alternating BBB  . Frequent urination   . Full dentures   . Gait instability    due to chronic low back pain, uses roller walker  . GERD (gastroesophageal reflux disease)   . History of iron deficiency anemia 11/2013   resolved w/ IV Iron  . History of stomach ulcers 11/2013  . Hypertension   . LBBB (left bundle branch block)   . NICM (nonischemic cardiomyopathy) ([place]) last echo 12-31-2015 ef 55-60%    dx 09/ 2015 per echo 20%;  myoview 09/ 2015 ef 28%;  per cardiac cath 12/ 2015 ef 30%;    . Nocturia more than twice per night   . Scoliosis     Past Surgical History:  Procedure Laterality Date  . BI-VENTRICULAR IMPLANTABLE CARDIOVERTER DEFIBRILLATOR N/A 05/07/2014   Procedure: BI-VENTRICULAR IMPLANTABLE CARDIOVERTER DEFIBRILLATOR  (CRT-D);  Surgeon: Deboraha Sprang, MD;  Location: [facility] CATH LAB;  Service: Cardiovascular;  Laterality: N/A;  . CARDIAC CATHETERIZATION  03-03-2014  dr Kathlyn [place]   [facility]   pLAD 20%, pRCA 20%, dRCA 50%, RPLS 50%;  ef 30%, mild elevated LVEDP, mild gradient across aortic valve LVOT  . CARDIOVASCULAR STRESS TEST  11/25/2013   High risk nuclear study w/ large high severity inferior wall perfusion defect on stress and rest images, large mild severity anteroseptal wall perfusion defect on stress and rest images, No inducible ischemia/ global moderate hypokinesis, ef 28%  . CATARACT EXTRACTION W/ INTRAOCULAR LENS  IMPLANT, BILATERAL Bilateral right 12-2013 / left  02-2014  . CYSTOSCOPY W/ RETROGRADES Bilateral 01/06/2015   Procedure: CYSTOSCOPY WITH  BLADDER BIOPSY BILATERAL RETROGRADE PYELOGRAM,INSTILLATION OF MITOMYCIN C;  Surgeon: [name], MD;  Location: [facility] ORS;  Service: Urology;  Laterality: Bilateral;  . CYSTOSCOPY WITH BIOPSY  N/A 10/06/2015   Procedure: CYSTO WITH BLADDER BIOPSY, FULGERATION, CHEMO IRRIGATION EPIRUBICIN IN PACU;  Surgeon: [name], MD;  Location: [facility];  Service: Urology;  Laterality: N/A;  . CYSTOSCOPY WITH FULGERATION N/A 06/30/2017   Procedure: Marland Kitchen CYSTOSCOPY WITH [place] FULGERATION/ BLADDER BIOPSY/ INSTILLATION OF EPIRUBICIN;  Surgeon: [name], MD;  Location: [facility];  Service: Urology;  Laterality: N/A;  . CYSTOSCOPY WITH RETROGRADE PYELOGRAM, URETEROSCOPY AND STENT PLACEMENT Bilateral 04/18/2014   Procedure: CYSTOSCOPY WITH RETROGRADE PYELOGRAM;  Surgeon: [name], MD;  Location:  [facility];  Service: Urology;  Laterality: Bilateral;  . ESOPHAGOGASTRODUODENOSCOPY N/A 11/28/2013   Procedure: ESOPHAGOGASTRODUODENOSCOPY (EGD);  Surgeon: Arta Silence, MD;  Location: [facility] ENDOSCOPY;  Service: Endoscopy;  Laterality: N/A;  . LUMBAR [place]  1980's   "ruptured disc"  . TRANSTHORACIC ECHOCARDIOGRAM  12/31/2015   dr Caryl Comes   ef 99-833, grade 1 diastolic dysfunction/ mild MR/ septal motion showed abnormal function and dyssynergy  . TRANSURETHRAL RESECTION OF BLADDER  1995  . TRANSURETHRAL RESECTION OF BLADDER TUMOR N/A 12/13/2013   Procedure: TRANSURETHRAL RESECTION OF BLADDER TUMOR (TURBT);  Surgeon: [name], MD;  Location: [facility];  Service: Urology;  Laterality: N/A;  . TRANSURETHRAL RESECTION OF BLADDER TUMOR N/A 01/17/2014   Procedure: TRANSURETHRAL RESECTION OF BLADDER TUMOR (TURBT);  Surgeon: [name], MD;  Location: [facility];  Service: Urology;  Laterality: N/A;  . TRANSURETHRAL RESECTION OF BLADDER TUMOR N/A 04/18/2014   Procedure: TRANSURETHRAL RESECTION OF BLADDER TUMOR (TURBT), CYSTOGRAM;  Surgeon: [name], MD;  Location: [facility];  Service: Urology;  Laterality: N/A;    There were no vitals filed for this visit.  Subjective Assessment - 01/17/18 1440    Subjective  Patient reports that she is having back pain today that is 6/10  . She wants to work  on her balance.    Pertinent History  "I have trouble with my balance." Pt reports that difficulty with balance started in 2014 and has been slowly worsening. She denies any tremors but does state that she has trouble writing. States that she has a hard time controlling the pen. Her medical record indicates history of parkinson's like symptoms. She states that she is unable to vacuum or perform housekeeping secondary to imbalance. She feels like her legs are going to buckle on her. She states that her toes are beginning to curl under on her right foot. Pt had a brain CT in 12/2016 without any notable  findings. Lumbar CT from 05/2016 showed multilevel spondylosis. PMH includes cervical, thoracic, and lumbar spinal stenosis. She is unable to have an MRI due to AICD. She also has a history or progressive back pack which started in 2015. She has seen two neurosurgeons and they stated that her back problem may require major surgery but they didn't feel like she was a good surgical candidate. She has been seeing Dr. Holley Raring at the pain clinic and he has referred her to another neurosurgeon. Her appointment with neurosurgery is scheduled for Friday.     Limitations  Walking    Diagnostic tests  Lumbar and head CT (see history). Unable to have MRI secondary to AICD    Patient Stated Goals  Improve balance and leg strength. Decrease fall risk    Currently in Pain?  Yes    Pain Score  6     Pain Location  Back    Pain Orientation  Lower    Pain Descriptors / Indicators  Aching  Pain Type  Chronic pain    Aggravating Factors   walking    Pain Relieving Factors  walking    Effect of Pain on Daily Activities  lying down         Therapeutic exercise:  nustep level 3 for warm up (unbilled) x53mins  Bridges x 10   hooklying marching x 20 with TA  Hooklying ER/abd x 20 with TA  sidelying hip abd x 10  Tandem standing in parallel bars x 2 mins with each leg leading  Standing on foam 75 % UE support on parallel bars  Stepping over 1 hurdle bwd x10 cues for posture, increased hip flexion  Standing hip abd without resistance due to weakness2x5 bilaterally  Standing hip extension withoutGTB due to increased fatigue today 2x5BLE    Cues for core activationof TAin standing to reduce backpainand for improved posture. Patient needed several sitting rest breaks during session.                       PT Education - 01/17/18 1442    Education Details  HEP    Person(s) Educated  Patient    Methods  Explanation    Comprehension  Returned demonstration;Need further  instruction       PT Short Term Goals - 01/03/18 1502      PT SHORT TERM GOAL #1   Title  Pt will be independent with HEP in order to improve strength and balance in order to decrease fall risk and improve function at home and work.     Time  3    Period  Weeks    Status  New    Target Date  01/24/18        PT Long Term Goals - 01/03/18 1503      PT LONG TERM GOAL #1   Title  Pt will improve BERG by at least 3 points in order to demonstrate clinically significant improvement in balance.    Baseline  01/03/18: 11/56    Time  6    Period  Weeks    Status  New    Target Date  02/14/18      PT LONG TERM GOAL #2   Title  Pt will improve ABC by at least 13% in order to demonstrate clinically significant improvement in balance confidence.     Baseline  01/03/18: 39.4%    Time  6    Period  Weeks    Status  New    Target Date  02/14/18      PT LONG TERM GOAL #3   Title  Pt will decrease 5TSTS by at least 3 seconds in order to demonstrate clinically significant improvement in LE strength.    Baseline  01/03/18: 33 seconds    Time  6    Period  Weeks    Status  New    Target Date  02/14/18      PT LONG TERM GOAL #4   Title  Pt will decrease TUG to below 14 seconds/decrease 23% in order to demonstrate decreased fall risk.    Baseline  01/03/18: 16.3 seconds    Time  6    Period  Weeks    Status  New    Target Date  02/14/18            Plan - 01/17/18 1444    Clinical Impression Statement  Patient demonstrates improved stability and strength allowing patient to perform short duration  standing interventions with rest periods.  Patient performs beginning standing dynamic standing balance exercises to shift weight and perform single leg standing activities with mod assist. Patient fatigues quickly with exercises requiring rest breaks at this time. Patient will continue to benefit from skilled physical therapy to improve pain and mobility.    Rehab Potential  Fair    PT  Frequency  2x / week    PT Duration  6 weeks    PT Treatment/Interventions  ADLs/Self Care Home Management;Aquatic Therapy;Canalith Repostioning;Cryotherapy;Electrical Stimulation;Iontophoresis 4mg /ml Dexamethasone;Moist Heat;Traction;Ultrasound;DME Instruction;Gait training;Stair training;Functional mobility training;Therapeutic activities;Therapeutic exercise;Balance training;Neuromuscular re-education;Cognitive remediation;Patient/family education;Manual techniques;Dry needling;Energy conservation;Vestibular;Visual/perceptual remediation/compensation    PT Next Visit Plan  Progress balance and strengthening, progress HEP as pt is able    PT Home Exercise Plan  Hooklying bridges 2 x 10 BID, Feet together EO balance 30s x 3 BID    Consulted and Agree with Plan of Care  Patient       Patient will benefit from skilled therapeutic intervention in order to improve the following deficits and impairments:  Abnormal gait, Decreased balance, Decreased strength, Pain  Visit Diagnosis: Unsteadiness on feet  Muscle weakness (generalized)     Problem List Patient Active Problem List   Diagnosis Date Noted  . Lumbar spondylosis 07/11/2017  . SI joint arthritis 07/11/2017  . Lumbar degenerative disc disease 07/11/2017  . Hyperlipidemia 03/26/2017  . S/P placement of cardiac pacemaker 03/26/2017  . Coronary artery disease 03/26/2017  . 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 Hypertension 09/06/2016  . Spinal stenosis of lumbar region without neurogenic claudication 08/18/2016  . Other idiopathic scoliosis, lumbar region 08/18/2016  . NSAID induced gastritis 05/26/2016  . Chronic bilateral low back pain 05/15/2016  . Spinal stenosis in cervical region 01/19/2014  . Constipation 12/29/2013  . Spinal stenosis of thoracolumbar region 12/27/2013  . COPD (chronic obstructive pulmonary disease) ([place]) 12/27/2013  . History of gastric ulcer 11/28/2013  . Congestive dilated cardiomyopathy ([place]) 11/25/2013   . Bladder cancer ([place]) 11/24/2013  . LBBB (left bundle branch block) 11/24/2013  . Atrial ectopy 11/24/2013  . Palpitations 11/22/2013  . Hyponatremia 11/22/2013    Alanson Puls, PT DPT 01/17/2018, 2:46 PM  [place] MAIN [facility] SERVICES [address] [place], [place], [address] Phone: [phone]   Fax:  [phone]  Name: STEVE GREGG MRN: [id number] Date of Birth: [birth year]

## 2018-01-18 ENCOUNTER — Ambulatory Visit: Payer: Medicare Other

## 2018-01-18 ENCOUNTER — Encounter: Payer: Self-pay | Admitting: Psychology

## 2018-01-22 ENCOUNTER — Ambulatory Visit: Payer: Medicare Other

## 2018-01-22 DIAGNOSIS — C672 Malignant neoplasm of lateral wall of bladder: Secondary | ICD-10-CM | POA: Diagnosis not present

## 2018-01-24 ENCOUNTER — Ambulatory Visit: Payer: Medicare Other | Attending: Neurology | Admitting: Physical Therapy

## 2018-01-24 ENCOUNTER — Encounter: Payer: Self-pay | Admitting: Physical Therapy

## 2018-01-24 DIAGNOSIS — R2681 Unsteadiness on feet: Secondary | ICD-10-CM | POA: Insufficient documentation

## 2018-01-24 DIAGNOSIS — M6281 Muscle weakness (generalized): Secondary | ICD-10-CM | POA: Diagnosis not present

## 2018-01-24 NOTE — Therapy (Addendum)
[place] MAIN [facility] SERVICES [address] [place], [place], [address] Phone: [phone]   Fax:  [phone]  Physical Therapy Treatment  Patient Details  Name: [name] MRN: [id number] Date of Birth: 1940-06-23 Referring Provider (PT): Dr. Manuella Ghazi   Encounter Date: 01/24/2018  PT End of Session - 01/24/18 1357    Visit Number  4    Number of Visits  13    Date for PT Re-Evaluation  02/14/18    Authorization Type  Progress note 4/10, last goals: 01/03/18    PT Start Time  1347    PT Stop Time  1430    PT Time Calculation (min)  43 min    Equipment Utilized During Treatment  Gait belt    Activity Tolerance  Patient tolerated treatment well    Behavior During Therapy  [facility] for tasks assessed/performed       Past Medical History:  Diagnosis Date  . 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 Arthritis    "in about all my joints; for sure in my back"  . Bladder cancer [facility]) urologist-  dr Junious Silk   dx 1995--  recurrent bladder cancer 2015 , s/p TURBT's and chemo instillation's  . Chronic hyponatremia   . Chronic lower back pain   . Chronic systolic (congestive) heart failure [facility])    cardiologist-  dr Rogue Jury  . COPD with emphysema ([place])   . Coronary artery disease cardiologist-  dr Kathlyn [place]   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% per cardiac cath 03-03-2014  . DDD (degenerative disc disease), thoracolumbar   . Ectopic atrial beats    2015  alternating BBB  . Frequent urination   . Full dentures   . Gait instability    due to chronic low back pain, uses roller walker  . GERD (gastroesophageal reflux disease)   . History of iron deficiency anemia 11/2013   resolved w/ IV Iron  . History of stomach ulcers 11/2013  . Hypertension   . LBBB (left bundle branch block)   . NICM (nonischemic cardiomyopathy) ([place]) last echo 12-31-2015 ef 55-60%    dx 09/ 2015 per echo 20%;  myoview 09/ 2015 ef 28%;  per cardiac cath 12/ 2015 ef 30%;    . Nocturia more than twice per night   . Scoliosis     Past Surgical History:  Procedure Laterality Date  . BI-VENTRICULAR IMPLANTABLE CARDIOVERTER DEFIBRILLATOR N/A 05/07/2014   Procedure: BI-VENTRICULAR IMPLANTABLE CARDIOVERTER DEFIBRILLATOR  (CRT-D);  Surgeon: Deboraha Sprang, MD;  Location: [facility] CATH LAB;  Service: Cardiovascular;  Laterality: N/A;  . CARDIAC CATHETERIZATION  03-03-2014  dr Kathlyn [place]   [facility]   pLAD 20%, pRCA 20%, dRCA 50%, RPLS 50%;  ef 30%, mild elevated LVEDP, mild gradient across aortic valve LVOT  . CARDIOVASCULAR STRESS TEST  11/25/2013   High risk nuclear study w/ large high severity inferior wall perfusion defect on stress and rest images, large mild severity anteroseptal wall perfusion defect on stress and rest images, No inducible ischemia/ global moderate hypokinesis, ef 28%  . CATARACT EXTRACTION W/ INTRAOCULAR LENS  IMPLANT, BILATERAL Bilateral right 12-2013 / left  02-2014  . CYSTOSCOPY W/ RETROGRADES Bilateral 01/06/2015   Procedure: CYSTOSCOPY WITH  BLADDER BIOPSY BILATERAL RETROGRADE PYELOGRAM,INSTILLATION OF MITOMYCIN C;  Surgeon: [name], MD;  Location: [facility] ORS;  Service: Urology;  Laterality: Bilateral;  . CYSTOSCOPY WITH  BIOPSY N/A 10/06/2015   Procedure: CYSTO WITH BLADDER BIOPSY, FULGERATION, CHEMO IRRIGATION EPIRUBICIN IN PACU;  Surgeon: [name], MD;  Location: [facility] ORS;  Service: Urology;  Laterality: N/A;  . CYSTOSCOPY WITH FULGERATION N/A 06/30/2017   Procedure: Marland Kitchen CYSTOSCOPY WITH [place] FULGERATION/ BLADDER BIOPSY/ INSTILLATION OF EPIRUBICIN;  Surgeon: [name], MD;  [facility];  Service: Urology;  Laterality: N/A;  . CYSTOSCOPY WITH RETROGRADE PYELOGRAM, URETEROSCOPY AND STENT PLACEMENT Bilateral 04/18/2014   Procedure: CYSTOSCOPY WITH RETROGRADE PYELOGRAM;  Surgeon: [name], MD;  Location:  [facility] ORS;  Service: Urology;  Laterality: Bilateral;  . ESOPHAGOGASTRODUODENOSCOPY N/A 11/28/2013   Procedure: ESOPHAGOGASTRODUODENOSCOPY (EGD);  Surgeon: Arta Silence, MD;  Location: [facility] ENDOSCOPY;  Service: Endoscopy;  Laterality: N/A;  . LUMBAR [place]  1980's   "ruptured disc"  . TRANSTHORACIC ECHOCARDIOGRAM  12/31/2015   dr Caryl Comes   ef 34-742, grade 1 diastolic dysfunction/ mild MR/ septal motion showed abnormal function and dyssynergy  . TRANSURETHRAL RESECTION OF BLADDER  1995  . TRANSURETHRAL RESECTION OF BLADDER TUMOR N/A 12/13/2013   Procedure: TRANSURETHRAL RESECTION OF BLADDER TUMOR (TURBT);  Surgeon: [name], MD;  Location: [facility];  Service: Urology;  Laterality: N/A;  . TRANSURETHRAL RESECTION OF BLADDER TUMOR N/A 01/17/2014   Procedure: TRANSURETHRAL RESECTION OF BLADDER TUMOR (TURBT);  Surgeon: [name], MD;  Location: [facility];  Service: Urology;  Laterality: N/A;  . TRANSURETHRAL RESECTION OF BLADDER TUMOR N/A 04/18/2014   Procedure: TRANSURETHRAL RESECTION OF BLADDER TUMOR (TURBT), CYSTOGRAM;  Surgeon: [name], MD;  Location: [facility] ORS;  Service: Urology;  Laterality: N/A;    There were no vitals filed for this visit.  Subjective Assessment - 01/24/18 1355    Subjective  Patient reports she continues to have back pain today; wants to continue working on her strength and balance.     Pertinent History  "I have trouble with my balance." Pt reports that difficulty with balance started in 2014 and has been slowly worsening. She denies any tremors but does state that she has trouble writing. States that she has a hard time controlling the pen. Her medical record indicates history of parkinson's like symptoms. She states that she is unable to vacuum or perform housekeeping secondary to imbalance. She feels like her legs are going to buckle on her. She states that her toes are beginning to curl under on her right foot. Pt had a brain CT in 12/2016 without any  notable findings. Lumbar CT from 05/2016 showed multilevel spondylosis. PMH includes cervical, thoracic, and lumbar spinal stenosis. She is unable to have an MRI due to AICD. She also has a history or progressive back pack which started in 2015. She has seen two neurosurgeons and they stated that her back problem may require major surgery but they didn't feel like she was a good surgical candidate. She has been seeing Dr. Holley Raring at the pain clinic and he has referred her to another neurosurgeon. Her appointment with neurosurgery is scheduled for Friday.     Limitations  Walking    Diagnostic tests  Lumbar and head CT (see history). Unable to have MRI secondary to AICD    Patient Stated Goals  Improve balance and leg strength. Decrease fall risk    Currently in Pain?  Yes    Pain Score  7     Pain Location  Back    Pain Orientation  Lower    Pain Descriptors / Indicators  Aching    Pain Type  Chronic pain    Pain Onset  More than a month ago    Pain Frequency  Constant    Aggravating Factors   leaning over, sorting clothes    Pain Relieving Factors  ice, medications    Effect of Pain on Daily Activities  decreased activity tolerance    Multiple Pain Sites  No       Treatment Supine, moist heat to low back for comfort: -Bridging 2x10 reps -Hooklying marching x10 reps bilaterally with TrA activation -Hooklying ABD with red tband x15 reps bilaterally with TrA activation VCs for maintaining core activation throughout exercises -SLR x10 reps bilaterally    Sit <> stand from elevated mat table 2x10 reps without UE support; CGA-min A for posterior LOB, VCs for reaching forwards and maintaining forward trunk lean prior to stand   Standing with rollator:  -Tandem stance x15 sec holds with faded/intermittent UE support, x1 bout with each LE in rear -Narrow BOS x30 sec holds x2 bouts without UE support                PT Education - 01/24/18 1356    Education Details  exercise  technique/form, HEP    Person(s) Educated  Patient    Methods  Explanation;Demonstration;Verbal cues    Comprehension  Verbalized understanding;Returned demonstration;Need further instruction;Verbal cues required       PT Short Term Goals - 01/03/18 1502      PT SHORT TERM GOAL #1   Title  Pt will be independent with HEP in order to improve strength and balance in order to decrease fall risk and improve function at home and work.     Time  3    Period  Weeks    Status  New    Target Date  01/24/18        PT Long Term Goals - 01/03/18 1503      PT LONG TERM GOAL #1   Title  Pt will improve BERG by at least 3 points in order to demonstrate clinically significant improvement in balance.    Baseline  01/03/18: 11/56    Time  6    Period  Weeks    Status  New    Target Date  02/14/18      PT LONG TERM GOAL #2   Title  Pt will improve ABC by at least 13% in order to demonstrate clinically significant improvement in balance confidence.     Baseline  01/03/18: 39.4%    Time  6    Period  Weeks    Status  New    Target Date  02/14/18      PT LONG TERM GOAL #3   Title  Pt will decrease 5TSTS by at least 3 seconds in order to demonstrate clinically significant improvement in LE strength.    Baseline  01/03/18: 33 seconds    Time  6    Period  Weeks    Status  New    Target Date  02/14/18      PT LONG TERM GOAL #4   Title  Pt will decrease TUG to below 14 seconds/decrease 23% in order to demonstrate decreased fall risk.    Baseline  01/03/18: 16.3 seconds    Time  6    Period  Weeks    Status  New    Target Date  02/14/18            Plan - 01/24/18 1539    Clinical Impression  Statement  Patient demonstrates improved LE strengthening exercise technique with increased core activation. Pt continued to require prolonged rest breaks between exercises. Pt demonstrated difficulty with sit <> stands in maintaining forward trunk lean when coming to stand as well as avoiding  posterior lean in standing. Pt continued to reach for rollator in standing position despite not showing any LOB. Pt required CGA-min A for safety in sit <> stands as well as to maintain balance in standing. Pt will continue to benefit from skilled PT intervention for improvements in pain and mobility.    Rehab Potential  Fair    PT Frequency  2x / week    PT Duration  6 weeks    PT Treatment/Interventions  ADLs/Self Care Home Management;Aquatic Therapy;Canalith Repostioning;Cryotherapy;Electrical Stimulation;Iontophoresis 4mg /ml Dexamethasone;Moist Heat;Traction;Ultrasound;DME Instruction;Gait training;Stair training;Functional mobility training;Therapeutic activities;Therapeutic exercise;Balance training;Neuromuscular re-education;Cognitive remediation;Patient/family education;Manual techniques;Dry needling;Energy conservation;Vestibular;Visual/perceptual remediation/compensation    PT Next Visit Plan  Progress balance and strengthening, progress HEP as pt is able    PT Home Exercise Plan  Hooklying bridges 2 x 10 BID, Feet together EO balance 30s x 3 BID    Consulted and Agree with Plan of Care  Patient       Patient will benefit from skilled therapeutic intervention in order to improve the following deficits and impairments:  Abnormal gait, Decreased balance, Decreased strength, Pain  Visit Diagnosis: Unsteadiness on feet  Muscle weakness (generalized)     Problem List Patient Active Problem List   Diagnosis Date Noted  . Lumbar spondylosis 07/11/2017  . SI joint arthritis 07/11/2017  . Lumbar degenerative disc disease 07/11/2017  . Hyperlipidemia 03/26/2017  . S/P placement of cardiac pacemaker 03/26/2017  . Coronary artery disease 03/26/2017  . 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 Hypertension 09/06/2016  . Spinal stenosis of lumbar region without neurogenic claudication 08/18/2016  . Other idiopathic scoliosis, lumbar region 08/18/2016  . NSAID induced gastritis 05/26/2016  .  Chronic bilateral low back pain 05/15/2016  . Spinal stenosis in cervical region 01/19/2014  . Constipation 12/29/2013  . Spinal stenosis of thoracolumbar region 12/27/2013  . COPD (chronic obstructive pulmonary disease) ([place]) 12/27/2013  . History of gastric ulcer 11/28/2013  . Congestive dilated cardiomyopathy ([place]) 11/25/2013  . Bladder cancer ([place]) 11/24/2013  . LBBB (left bundle branch block) 11/24/2013  . Atrial ectopy 11/24/2013  . Palpitations 11/22/2013  . Hyponatremia 11/22/2013   Harriet Masson, SPT This entire session was performed under direct supervision and direction of a licensed therapist/therapist assistant . I have personally read, edited and approve of the note as written.  Trotter,Margaret PT, DPT 01/25/2018, 9:45 AM  [place] MAIN [facility] SERVICES [address] [place], [place], [address] Phone: [phone]   Fax:  [phone]  Name: [name] MRN: [id number] Date of Birth: Oct 09, 1940

## 2018-01-29 ENCOUNTER — Ambulatory Visit (INDEPENDENT_AMBULATORY_CARE_PROVIDER_SITE_OTHER): Payer: Medicare Other

## 2018-01-29 DIAGNOSIS — Z23 Encounter for immunization: Secondary | ICD-10-CM | POA: Diagnosis not present

## 2018-01-30 ENCOUNTER — Ambulatory Visit: Payer: Medicare Other

## 2018-02-01 ENCOUNTER — Ambulatory Visit: Payer: Medicare Other | Admitting: Physical Therapy

## 2018-02-05 ENCOUNTER — Ambulatory Visit: Payer: Medicare Other | Admitting: Physical Therapy

## 2018-02-06 ENCOUNTER — Ambulatory Visit
Payer: Medicare Other | Attending: Student in an Organized Health Care Education/Training Program | Admitting: Student in an Organized Health Care Education/Training Program

## 2018-02-06 ENCOUNTER — Encounter: Payer: Self-pay | Admitting: Physical Therapy

## 2018-02-06 ENCOUNTER — Other Ambulatory Visit: Payer: Self-pay

## 2018-02-06 ENCOUNTER — Encounter: Payer: Self-pay | Admitting: Student in an Organized Health Care Education/Training Program

## 2018-02-06 ENCOUNTER — Ambulatory Visit: Payer: Medicare Other | Admitting: Physical Therapy

## 2018-02-06 VITALS — BP 145/89 | HR 75 | Temp 98.2°F | Resp 16 | Ht 68.0 in | Wt 120.0 lb

## 2018-02-06 DIAGNOSIS — M5416 Radiculopathy, lumbar region: Secondary | ICD-10-CM

## 2018-02-06 DIAGNOSIS — I110 Hypertensive heart disease with heart failure: Secondary | ICD-10-CM | POA: Diagnosis not present

## 2018-02-06 DIAGNOSIS — Z8551 Personal history of malignant neoplasm of bladder: Secondary | ICD-10-CM | POA: Insufficient documentation

## 2018-02-06 DIAGNOSIS — M4802 Spinal stenosis, cervical region: Secondary | ICD-10-CM | POA: Insufficient documentation

## 2018-02-06 DIAGNOSIS — M6281 Muscle weakness (generalized): Secondary | ICD-10-CM | POA: Diagnosis not present

## 2018-02-06 DIAGNOSIS — Z8719 Personal history of other diseases of the digestive system: Secondary | ICD-10-CM | POA: Diagnosis not present

## 2018-02-06 DIAGNOSIS — R2681 Unsteadiness on feet: Secondary | ICD-10-CM | POA: Diagnosis not present

## 2018-02-06 DIAGNOSIS — Z5181 Encounter for therapeutic drug level monitoring: Secondary | ICD-10-CM | POA: Diagnosis not present

## 2018-02-06 DIAGNOSIS — M4805 Spinal stenosis, thoracolumbar region: Secondary | ICD-10-CM | POA: Insufficient documentation

## 2018-02-06 DIAGNOSIS — M5136 Other intervertebral disc degeneration, lumbar region: Secondary | ICD-10-CM | POA: Diagnosis not present

## 2018-02-06 DIAGNOSIS — Z95810 Presence of automatic (implantable) cardiac defibrillator: Secondary | ICD-10-CM | POA: Diagnosis not present

## 2018-02-06 DIAGNOSIS — M4726 Other spondylosis with radiculopathy, lumbar region: Secondary | ICD-10-CM | POA: Insufficient documentation

## 2018-02-06 DIAGNOSIS — J439 Emphysema, unspecified: Secondary | ICD-10-CM | POA: Insufficient documentation

## 2018-02-06 DIAGNOSIS — M47816 Spondylosis without myelopathy or radiculopathy, lumbar region: Secondary | ICD-10-CM | POA: Diagnosis not present

## 2018-02-06 DIAGNOSIS — E871 Hypo-osmolality and hyponatremia: Secondary | ICD-10-CM | POA: Insufficient documentation

## 2018-02-06 DIAGNOSIS — E785 Hyperlipidemia, unspecified: Secondary | ICD-10-CM | POA: Diagnosis not present

## 2018-02-06 DIAGNOSIS — I447 Left bundle-branch block, unspecified: Secondary | ICD-10-CM | POA: Insufficient documentation

## 2018-02-06 DIAGNOSIS — I2510 Atherosclerotic heart disease of native coronary artery without angina pectoris: Secondary | ICD-10-CM | POA: Diagnosis not present

## 2018-02-06 DIAGNOSIS — M48062 Spinal stenosis, lumbar region with neurogenic claudication: Secondary | ICD-10-CM

## 2018-02-06 DIAGNOSIS — M4126 Other idiopathic scoliosis, lumbar region: Secondary | ICD-10-CM | POA: Insufficient documentation

## 2018-02-06 DIAGNOSIS — Z79899 Other long term (current) drug therapy: Secondary | ICD-10-CM | POA: Diagnosis not present

## 2018-02-06 DIAGNOSIS — G894 Chronic pain syndrome: Secondary | ICD-10-CM | POA: Diagnosis not present

## 2018-02-06 DIAGNOSIS — M5116 Intervertebral disc disorders with radiculopathy, lumbar region: Secondary | ICD-10-CM | POA: Insufficient documentation

## 2018-02-06 DIAGNOSIS — F17210 Nicotine dependence, cigarettes, uncomplicated: Secondary | ICD-10-CM | POA: Diagnosis not present

## 2018-02-06 DIAGNOSIS — I420 Dilated cardiomyopathy: Secondary | ICD-10-CM | POA: Diagnosis not present

## 2018-02-06 DIAGNOSIS — I5022 Chronic systolic (congestive) heart failure: Secondary | ICD-10-CM | POA: Insufficient documentation

## 2018-02-06 DIAGNOSIS — K219 Gastro-esophageal reflux disease without esophagitis: Secondary | ICD-10-CM | POA: Insufficient documentation

## 2018-02-06 MED ORDER — HYDROCODONE-ACETAMINOPHEN 10-325 MG PO TABS: 1.0000 | ORAL_TABLET | ORAL | 0 refills | Status: DC | PRN

## 2018-02-06 MED ORDER — HYDROCODONE-ACETAMINOPHEN 10-325 MG PO TABS: 1.0000 | ORAL_TABLET | ORAL | 0 refills | Status: AC | PRN

## 2018-02-06 NOTE — Therapy (Addendum)
[place] MAIN [facility] SERVICES [address] [place], [place], [address] Phone: [phone]   Fax:  [phone]  Physical Therapy Treatment  Patient Details  Name: [name] MRN: [id number] Date of Birth: 1940-08-31 Referring Provider (PT): Dr. Manuella Ghazi   Encounter Date: 02/06/2018  PT End of Session - 02/06/18 1637    Visit Number  5   Number of Visits  13    Date for PT Re-Evaluation  02/14/18    Authorization Type  Progress note 4/10, last goals: 01/03/18    PT Start Time  0430    PT Stop Time  0515    PT Time Calculation (min)  45 min    Equipment Utilized During Treatment  Gait belt    Activity Tolerance  Patient tolerated treatment well    Behavior During Therapy  [facility] for tasks assessed/performed       Past Medical History:  Diagnosis Date  . 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 Arthritis    "in about all my joints; for sure in my back"  . Bladder cancer [facility]) urologist-  dr Junious Silk   dx 1995--  recurrent bladder cancer 2015 , s/p TURBT's and chemo instillation's  . Chronic hyponatremia   . Chronic lower back pain   . Chronic systolic (congestive) heart failure [facility])    cardiologist-  dr Rogue Jury  . COPD with emphysema ([place])   . Coronary artery disease cardiologist-  dr Kathlyn [place]   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% per cardiac cath 03-03-2014  . DDD (degenerative disc disease), thoracolumbar   . Ectopic atrial beats    2015  alternating BBB  . Frequent urination   . Full dentures   . Gait instability    due to chronic low back pain, uses roller walker  . GERD (gastroesophageal reflux disease)   . History of iron deficiency anemia 11/2013   resolved w/ IV Iron  . History of stomach ulcers 11/2013  . Hypertension   . LBBB (left bundle branch block)   . NICM (nonischemic cardiomyopathy) ([place]) last echo 12-31-2015 ef 55-60%    dx 09/ 2015 per echo 20%;  myoview 09/ 2015 ef 28%;  per cardiac cath 12/ 2015 ef 30%;    . Nocturia more than twice per night   . Scoliosis     Past Surgical History:  Procedure Laterality Date  . BI-VENTRICULAR IMPLANTABLE CARDIOVERTER DEFIBRILLATOR N/A 05/07/2014   Procedure: BI-VENTRICULAR IMPLANTABLE CARDIOVERTER DEFIBRILLATOR  (CRT-D);  Surgeon: Deboraha Sprang, MD;  Location: [facility] CATH LAB;  Service: Cardiovascular;  Laterality: N/A;  . CARDIAC CATHETERIZATION  03-03-2014  dr Kathlyn [place]   [facility]   pLAD 20%, pRCA 20%, dRCA 50%, RPLS 50%;  ef 30%, mild elevated LVEDP, mild gradient across aortic valve LVOT  . CARDIOVASCULAR STRESS TEST  11/25/2013   High risk nuclear study w/ large high severity inferior wall perfusion defect on stress and rest images, large mild severity anteroseptal wall perfusion defect on stress and rest images, No inducible ischemia/ global moderate hypokinesis, ef 28%  . CATARACT EXTRACTION W/ INTRAOCULAR LENS  IMPLANT, BILATERAL Bilateral right 12-2013 / left  02-2014  . CYSTOSCOPY W/ RETROGRADES Bilateral 01/06/2015   Procedure: CYSTOSCOPY WITH  BLADDER BIOPSY BILATERAL RETROGRADE PYELOGRAM,INSTILLATION OF MITOMYCIN C;  Surgeon: [name], MD;  Location: [facility] ORS;  Service: Urology;  Laterality: Bilateral;  . CYSTOSCOPY WITH BIOPSY  N/A 10/06/2015   Procedure: CYSTO WITH BLADDER BIOPSY, FULGERATION, CHEMO IRRIGATION EPIRUBICIN IN PACU;  Surgeon: [name], MD;  Location: [facility];  Service: Urology;  Laterality: N/A;  . CYSTOSCOPY WITH FULGERATION N/A 06/30/2017   Procedure: Marland Kitchen CYSTOSCOPY WITH [place] FULGERATION/ BLADDER BIOPSY/ INSTILLATION OF EPIRUBICIN;  Surgeon: [name], MD;  Location: [facility];  Service: Urology;  Laterality: N/A;  . CYSTOSCOPY WITH RETROGRADE PYELOGRAM, URETEROSCOPY AND STENT PLACEMENT Bilateral 04/18/2014   Procedure: CYSTOSCOPY WITH RETROGRADE PYELOGRAM;  Surgeon: [name], MD;  Location:  [facility];  Service: Urology;  Laterality: Bilateral;  . ESOPHAGOGASTRODUODENOSCOPY N/A 11/28/2013   Procedure: ESOPHAGOGASTRODUODENOSCOPY (EGD);  Surgeon: Arta Silence, MD;  Location: [facility] ENDOSCOPY;  Service: Endoscopy;  Laterality: N/A;  . LUMBAR [place]  1980's   "ruptured disc"  . TRANSTHORACIC ECHOCARDIOGRAM  12/31/2015   dr Caryl Comes   ef 08-657, grade 1 diastolic dysfunction/ mild MR/ septal motion showed abnormal function and dyssynergy  . TRANSURETHRAL RESECTION OF BLADDER  1995  . TRANSURETHRAL RESECTION OF BLADDER TUMOR N/A 12/13/2013   Procedure: TRANSURETHRAL RESECTION OF BLADDER TUMOR (TURBT);  Surgeon: [name], MD;  Location: [facility];  Service: Urology;  Laterality: N/A;  . TRANSURETHRAL RESECTION OF BLADDER TUMOR N/A 01/17/2014   Procedure: TRANSURETHRAL RESECTION OF BLADDER TUMOR (TURBT);  Surgeon: [name], MD;  Location: [facility];  Service: Urology;  Laterality: N/A;  . TRANSURETHRAL RESECTION OF BLADDER TUMOR N/A 04/18/2014   Procedure: TRANSURETHRAL RESECTION OF BLADDER TUMOR (TURBT), CYSTOGRAM;  Surgeon: [name], MD;  Location: [facility];  Service: Urology;  Laterality: N/A;    There were no vitals filed for this visit.  Subjective Assessment - 02/06/18 1636    Subjective  Patient reports she continues to have back pain today; wants to continue working on her strength and balance.     Pertinent History  "I have trouble with my balance." Pt reports that difficulty with balance started in 2014 and has been slowly worsening. She denies any tremors but does state that she has trouble writing. States that she has a hard time controlling the pen. Her medical record indicates history of parkinson's like symptoms. She states that she is unable to vacuum or perform housekeeping secondary to imbalance. She feels like her legs are going to buckle on her. She states that her toes are beginning to curl under on her right foot. Pt had a brain CT in 12/2016 without any  notable findings. Lumbar CT from 05/2016 showed multilevel spondylosis. PMH includes cervical, thoracic, and lumbar spinal stenosis. She is unable to have an MRI due to AICD. She also has a history or progressive back pack which started in 2015. She has seen two neurosurgeons and they stated that her back problem may require major surgery but they didn't feel like she was a good surgical candidate. She has been seeing Dr. Holley Raring at the pain clinic and he has referred her to another neurosurgeon. Her appointment with neurosurgery is scheduled for Friday.     Limitations  Walking    Diagnostic tests  Lumbar and head CT (see history). Unable to have MRI secondary to AICD    Patient Stated Goals  Improve balance and leg strength. Decrease fall risk    Pain Score  7     Pain Location  Back    Pain Orientation  Lateral    Pain Descriptors / Indicators  Aching    Pain Type  Chronic pain    Pain Onset  More than a month ago    Multiple Pain Sites  No       Treatment: Nu-step x 5 mins  Standing on 1/2 foam flat side down and 75 % need of UE x 2 mins Standing on purple foam and tapping 6 inc stool with 90% UE assist Standing on floor and tapping 6 inch stool with 75 % UE support x 20  Tapping laterally to stepping stones x 10 Feet together on floor and holding posture 50% Feet apart on floor and turning her head left and right with UE support 50% Hooklying marching with TA x 20, with MH to low back  Hooklying hip ER/abd with TA x 20 with MH to low back Patient has pain in low back that interferes with her being able to perform exercises.  Following PT her pain is 5/10 to low back.                      PT Education - 02/06/18 1636    Education Details  exercise technique/form, HEP    Person(s) Educated  Patient    Methods  Explanation    Comprehension  Returned demonstration;Need further instruction       PT Short Term Goals - 01/03/18 1502      PT SHORT TERM GOAL #1    Title  Pt will be independent with HEP in order to improve strength and balance in order to decrease fall risk and improve function at home and work.     Time  3    Period  Weeks    Status  New    Target Date  01/24/18        PT Long Term Goals - 01/03/18 1503      PT LONG TERM GOAL #1   Title  Pt will improve BERG by at least 3 points in order to demonstrate clinically significant improvement in balance.    Baseline  01/03/18: 11/56    Time  6    Period  Weeks    Status  New    Target Date  02/14/18      PT LONG TERM GOAL #2   Title  Pt will improve ABC by at least 13% in order to demonstrate clinically significant improvement in balance confidence.     Baseline  01/03/18: 39.4%    Time  6    Period  Weeks    Status  New    Target Date  02/14/18      PT LONG TERM GOAL #3   Title  Pt will decrease 5TSTS by at least 3 seconds in order to demonstrate clinically significant improvement in LE strength.    Baseline  01/03/18: 33 seconds    Time  6    Period  Weeks    Status  New    Target Date  02/14/18      PT LONG TERM GOAL #4   Title  Pt will decrease TUG to below 14 seconds/decrease 23% in order to demonstrate decreased fall risk.    Baseline  01/03/18: 16.3 seconds    Time  6    Period  Weeks    Status  New    Target Date  02/14/18            Plan - 02/06/18 1638    Clinical Impression Statement  Dynamic and static balance interventions continued today, with a focus on unilateral LE stability.  Pt has pain in low  back with  all exercises well.  Intermediate dynamic standing balance tasks were progressed with cga needed for reaching outside of her base of support.  Pt would continue to benefit from skilled therapy services in order to address balance deficits in order to decrease fall risk and improve mobility.    Rehab Potential  Fair    PT Frequency  2x / week    PT Duration  6 weeks    PT Treatment/Interventions  ADLs/Self Care Home Management;Aquatic  Therapy;Canalith Repostioning;Cryotherapy;Electrical Stimulation;Iontophoresis 4mg /ml Dexamethasone;Moist Heat;Traction;Ultrasound;DME Instruction;Gait training;Stair training;Functional mobility training;Therapeutic activities;Therapeutic exercise;Balance training;Neuromuscular re-education;Cognitive remediation;Patient/family education;Manual techniques;Dry needling;Energy conservation;Vestibular;Visual/perceptual remediation/compensation    PT Next Visit Plan  Progress balance and strengthening, progress HEP as pt is able    PT Home Exercise Plan  Hooklying bridges 2 x 10 BID, Feet together EO balance 30s x 3 BID    Consulted and Agree with Plan of Care  Patient       Patient will benefit from skilled therapeutic intervention in order to improve the following deficits and impairments:  Abnormal gait, Decreased balance, Decreased strength, Pain  Visit Diagnosis: Unsteadiness on feet  Muscle weakness (generalized)     Problem List Patient Active Problem List   Diagnosis Date Noted  . Chronic pain syndrome 02/06/2018  . Lumbar radiculopathy 02/06/2018  . Lumbar spondylosis 07/11/2017  . SI joint arthritis 07/11/2017  . Lumbar degenerative disc disease 07/11/2017  . Hyperlipidemia 03/26/2017  . S/P placement of cardiac pacemaker 03/26/2017  . Coronary artery disease 03/26/2017  . 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 Hypertension 09/06/2016  . Spinal stenosis, lumbar region, 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/18/2016  . Other idiopathic scoliosis, lumbar region 08/18/2016  . NSAID induced gastritis 05/26/2016  . Chronic bilateral low back pain 05/15/2016  . Spinal stenosis in cervical region 01/19/2014  . Constipation 12/29/2013  . Spinal stenosis of thoracolumbar region 12/27/2013  . COPD (chronic obstructive pulmonary disease) ([place]) 12/27/2013  . History of gastric ulcer 11/28/2013  . Congestive dilated cardiomyopathy ([place]) 11/25/2013  . Bladder cancer ([place]) 11/24/2013  . LBBB  (left bundle branch block) 11/24/2013  . Atrial ectopy 11/24/2013  . Palpitations 11/22/2013  . Hyponatremia 11/22/2013    Alanson Puls, PT DPT 02/06/2018, 5:08 PM  [place] MAIN [facility] SERVICES [address] [place], [place], [address] Phone: [phone]   Fax:  [phone]  Name: [name] MRN: [id number] Date of Birth: March 28, 1940

## 2018-02-06 NOTE — Progress Notes (Signed)
Patient's Name: [name]  MRN: [id number]  Referring Provider: [name], MD  DOB: Jun 10, 1940  PCP: [name], MD  DOS: 02/06/2018  Note by: [name], MD  Service setting: Ambulatory outpatient  Specialty: Interventional Pain Management  Location: [facility] (AMB) Pain Management Facility    Patient type: Established   Primary Reason(s) for Visit: Encounter for prescription drug management. (Level of risk: moderate)  CC: Back Pain (lower)  HPI  [name] is a 77 y.o. year old, female patient, who comes today for a medication management evaluation. She has Palpitations; Hyponatremia; Bladder cancer ([place]); LBBB (left bundle branch block); Atrial ectopy; Congestive dilated cardiomyopathy ([place]); History of gastric ulcer; Spinal stenosis of thoracolumbar region; COPD (chronic obstructive pulmonary disease) ([place]); Constipation; Spinal stenosis in cervical region; Chronic bilateral low back pain; NSAID induced gastritis; Parkinsonian features; Hypertension; Spinal stenosis, lumbar region, with neurogenic claudication; Other idiopathic scoliosis, lumbar region; Hyperlipidemia; S/P placement of cardiac pacemaker; Coronary artery disease; Lumbar spondylosis; SI joint arthritis; Lumbar degenerative disc disease; Chronic pain syndrome; and Lumbar radiculopathy on their problem list. Her primarily concern today is the Back Pain (lower)  Pain Assessment: Location: Lower Back Radiating: through both hip, left is worse, down back of legs to thighs Onset: More than a month ago Duration: Chronic pain Quality: Aching, Constant Severity: 6 /10 (subjective, self-reported pain score)  Note: Reported level is compatible with observation.                         When using our objective Pain Scale, levels between 6 and 10/10 are said to belong in an emergency room, as it progressively worsens from a 6/10, described as severely limiting, requiring emergency care not usually available at an outpatient pain  management facility. At a 6/10 level, communication becomes difficult and requires great effort. Assistance to reach the emergency department may be required. Facial flushing and profuse sweating along with potentially dangerous increases in heart rate and blood pressure will be evident. Effect on ADL: limits daily activities Timing: Constant Modifying factors: meds, ice, rest BP: (!) 145/89  HR: 75  [name] was last scheduled for an appointment on 01/10/2018 for medication management. During today's appointment we reviewed [name] chronic pain status, as well as her outpatient medication regimen.  Patient follows up today for medication management, in the interim, she has seen Dr. Cleon Dew physician assistant with [place] [facility].  I have placed an order for spinal cord stimulator trial eval with psychologist.  This will be in preparation for possible spinal cord stimulator trial at [facility]  She is also scheduled for CT myelogram and CT of the lumbar spine end of Decemberat that time, she will meet with Dr. Mearl Latin to discuss surgical options versus possible neuromodulation.   The patient  reports that she does not use drugs. Her body mass index is 18.25 kg/m.  Further details on both, my assessment(s), as well as the proposed treatment plan, please see below.  Controlled Substance Pharmacotherapy Assessment REMS (Risk Evaluation and Mitigation Strategy)  Analgesic: Hydrocodone 10 mg every 4 hours as needed, quantity 180/month MME/day: 60 mg/day.  [name], RN  02/06/2018 12:08 PM  Sign at close encounter Nursing Pain Medication Assessment:  Safety precautions to be maintained throughout the outpatient stay will include: orient to surroundings, keep bed in low position, maintain call bell within reach at all times, provide assistance with transfer out of bed and ambulation.  Medication Inspection Compliance: Pill  count conducted under aseptic conditions, in front of the patient. Neither  the pills nor the bottle was removed from the patient's sight at any time. Once count was completed pills were immediately returned to the patient in their original bottle.  Medication: Hydrocodone/APAP Pill/Patch Count: 159.5 of 180 pills remain Pill/Patch Appearance: Markings consistent with prescribed medication Bottle Appearance: Standard pharmacy container. Clearly labeled. Filled Date: 72 / 15 / 2019 Last Medication intake:  Today   Pharmacokinetics: Liberation and absorption (onset of action): WNL Distribution (time to peak effect): WNL Metabolism and excretion (duration of action): WNL         Pharmacodynamics: Desired effects: Analgesia: [name] reports >50% benefit. Functional ability: Patient reports that medication allows her to accomplish basic ADLs Clinically meaningful improvement in function (CMIF): Sustained CMIF goals met Perceived effectiveness: Described as relatively effective, allowing for increase in activities of daily living (ADL) Undesirable effects: Side-effects or Adverse reactions: None reported Monitoring: [place] PMP: Online review of the past 35-monthperiod conducted. Compliant with practice rules and regulations Last UDS on record: Summary  Date Value Ref Range Status  12/27/2016 FINAL  Final    Comment:    ==================================================================== TOXASSURE COMP DRUG ANALYSIS,UR ==================================================================== Test                             Result       Flag       Units Drug Present and Declared for Prescription Verification   Hydrocodone                    3156         EXPECTED   ng/mg creat   Hydromorphone                  848          EXPECTED   ng/mg creat   Dihydrocodeine                 236          EXPECTED   ng/mg creat   Norhydrocodone                 3780         EXPECTED   ng/mg creat    Sources of hydrocodone include scheduled prescription    medications. Hydromorphone,  dihydrocodeine and norhydrocodone are    expected metabolites of hydrocodone. Hydromorphone and    dihydrocodeine are also available as scheduled prescription    medications.   Gabapentin                     PRESENT      EXPECTED   Cyclobenzaprine                PRESENT      EXPECTED   Desmethylcyclobenzaprine       PRESENT      EXPECTED    Desmethylcyclobenzaprine is an expected metabolite of    cyclobenzaprine.   Acetaminophen                  PRESENT      EXPECTED   Metoprolol                     PRESENT      EXPECTED Drug Absent but Declared for Prescription Verification   Duloxetine  Not Detected UNEXPECTED   Promethazine                   Not Detected UNEXPECTED ==================================================================== Test                      Result    Flag   Units      Ref Range   Creatinine              25               mg/dL      >=20 ==================================================================== Declared Medications:  The flagging and interpretation on this report are based on the  following declared medications.  Unexpected results may arise from  inaccuracies in the declared medications.  **Note: The testing scope of this panel includes these medications:  Cyclobenzaprine (Flexeril)  Duloxetine (Cymbalta)  Gabapentin (Neurontin)  Hydrocodone (Norco)  Metoprolol (Toprol)  Promethazine (Phenergan)  **Note: The testing scope of this panel does not include small to  moderate amounts of these reported medications:  Acetaminophen (Norco)  **Note: The testing scope of this panel does not include following  reported medications:  Amlodipine (Norvasc)  Lisinopril (Prinivil)  Pantoprazole (Protonix)  Polyethylene Glycol (MiraLAX)  Spironolactone (Aldactone)  Sucralfate (Carafate) ==================================================================== For clinical consultation, please call [phone]. ====================================================================    UDS interpretation: Compliant          Medication Assessment Form: Reviewed. Patient indicates being compliant with therapy Treatment compliance: Compliant Risk Assessment Profile: Aberrant behavior: See prior evaluations. None observed or detected today Comorbid factors increasing risk of overdose: See prior notes. No additional risks detected today Opioid risk tool (ORT) (Total Score): 1 Personal History of Substance Abuse (SUD-Substance use disorder):  Alcohol: Negative  Illegal Drugs: Negative  Rx Drugs: Negative  ORT Risk Level calculation: Low Risk Risk of substance use disorder (SUD): Low Opioid Risk Tool - 02/06/18 1201      Family History of Substance Abuse   Alcohol  Positive Female    Illegal Drugs  Negative    Rx Drugs  Negative      Personal History of Substance Abuse   Alcohol  Negative    Illegal Drugs  Negative    Rx Drugs  Negative      Age   Age between 31-45 years   No      History of Preadolescent Sexual Abuse   History of Preadolescent Sexual Abuse  Negative or Female      Psychological Disease   Psychological Disease  Negative      Total Score   Opioid Risk Tool Scoring  1    Opioid Risk Interpretation  Low Risk      ORT Scoring interpretation table:  Score <3 = Low Risk for SUD  Score between 4-7 = Moderate Risk for SUD  Score >8 = High Risk for Opioid Abuse   Risk Mitigation Strategies:  Patient Counseling: Covered Patient-Prescriber Agreement (PPA): Present and active  Notification to other healthcare providers: Done  Pharmacologic Plan: No change in therapy, at this time.             Laboratory Chemistry  Inflammation Markers (CRP: Acute Phase) (ESR: Chronic Phase) No results found for: CRP, ESRSEDRATE, LATICACIDVEN                       Rheumatology Markers No results found for: RF, ANA, LABURIC, [place], [place], LYMEABIGMQN, HLAB27  Renal Function Markers Lab Results  Component Value Date   BUN 11 03/24/2017   CREATININE 0.73 03/24/2017   BCR 18 04/28/2014   GFRAA >60 10/02/2015   GFRNONAA >60 10/02/2015                             Hepatic Function Markers Lab Results  Component Value Date   AST 25 03/24/2017   ALT 17 03/24/2017   ALBUMIN 4.4 03/24/2017   ALKPHOS 105 03/24/2017   LIPASE 30 11/23/2013                        Electrolytes Lab Results  Component Value Date   NA 136 06/30/2017   K 4.2 06/30/2017   CL 99 03/24/2017   CALCIUM 9.9 03/24/2017                        Neuropathy Markers Lab Results  Component Value Date   VITAMINB12 1,069 (H) 12/27/2013   HGBA1C 4.6 12/27/2013                        CNS Tests No results found for: COLORCSF, APPEARCSF, RBCCOUNTCSF, WBCCSF, POLYSCSF, LYMPHSCSF, EOSCSF, PROTEINCSF, GLUCCSF, JCVIRUS, CSFOLI, IGGCSF                      Bone Pathology Markers No results found for: VD25OH, H139778, [id number], [id number], 25OHVITD1, 25OHVITD2, 25OHVITD3, TESTOFREE, TESTOSTERONE                       Coagulation Parameters Lab Results  Component Value Date   INR 1.0 04/28/2014   LABPROT 10.3 04/28/2014   APTT 27 11/22/2013   PLT 209 10/02/2015                        Cardiovascular Markers Lab Results  Component Value Date   CKTOTAL 87 09/01/2010   CKMB 4.1 (H) 09/01/2010   TROPONINI <0.30 11/23/2013   HGB 13.3 06/30/2017   HCT 39.0 06/30/2017                         CA Markers No results found for: CEA, CA125, LABCA2                      Note: Lab results reviewed.  Recent Diagnostic Imaging Results  CUP PACEART REMOTE DEVICE CHECK Normal remote reviewed.  Next follow up 02/27/18  Complexity Note: Imaging results reviewed. Results shared with [name], using Layman's terms.                         Meds   Current Outpatient Medications:  .  amLODipine (NORVASC) 2.5 MG tablet, Take 2.5 mg by mouth daily., Disp: , Rfl:  .   lisinopril (PRINIVIL,ZESTRIL) 40 MG tablet, TAKE 1 TABLET(40 MG) BY MOUTH DAILY, Disp: 90 tablet, Rfl: 0 .  metoprolol succinate (TOPROL-XL) 25 MG 24 hr tablet, Take 1 tablet (25 mg total) by mouth daily. (Patient taking differently: Take 25 mg by mouth every morning. ), Disp: 30 tablet, Rfl: 10 .  pantoprazole (PROTONIX) 40 MG tablet, TAKE 1 TABLET(40 MG) BY MOUTH TWICE DAILY, Disp: 180 tablet, Rfl: 1 .  polyethylene glycol (MIRALAX / GLYCOLAX) packet, Take 17 g by mouth  daily as needed for mild constipation. , Disp: , Rfl:  .  sucralfate (CARAFATE) 1 g tablet, TAKE 1 TABLET BY MOUTH FOUR TIMES DAILY EVERY NIGHT AT BEDTIME WITH MEALS, Disp: 120 tablet, Rfl: 0 .  Vitamin D, Ergocalciferol, (DRISDOL) 50000 units CAPS capsule, TK ONE C PO Q WEEK FOR 8 WEEKS, Disp: , Rfl: 0 .  [START ON 03/03/2018] HYDROcodone-acetaminophen (NORCO) 10-325 MG tablet, Take 1 tablet by mouth every 4 (four) hours as needed for severe pain., Disp: 180 tablet, Rfl: 0 .  pregabalin (LYRICA) 50 MG capsule, Take 1 capsule (50 mg total) by mouth at bedtime for 14 days, THEN 1 capsule (50 mg total) 2 (two) times daily for 14 days., Disp: 42 capsule, Rfl: 0 No current facility-administered medications for this visit.   Facility-Administered Medications Ordered in Other Visits:  .  epirubicin (ELLENCE) 50 mg in sodium chloride 0.9 % bladder instillation, 50 mg, Bladder Instillation, Once, [name], MD  ROS  Constitutional: Denies any fever or chills Gastrointestinal: No reported hemesis, hematochezia, vomiting, or acute GI distress Musculoskeletal: Denies any acute onset joint swelling, redness, loss of ROM, or weakness Neurological: No reported episodes of acute onset apraxia, aphasia, dysarthria, agnosia, amnesia, paralysis, loss of coordination, or loss of consciousness  Allergies  [name] has No Known Allergies.  [place]  Drug: [name]  reports that she does not use drugs. Alcohol:  reports that she does not  drink alcohol. Tobacco:  reports that she has been smoking cigarettes. She has a 27.50 pack-year smoking history. She has never used smokeless tobacco. Medical:  has a past medical history of AICD (automatic cardioverter/defibrillator) present (placement 05/07/2014 medtronic    ), Arthritis, Bladder cancer ([place]) (urologist-  dr Junious Silk), Chronic hyponatremia, Chronic lower back pain, Chronic systolic (congestive) heart failure ([place]), COPD with emphysema ([place]), Coronary artery disease (cardiologist-  dr Kathlyn [place]), DDD (degenerative disc disease), thoracolumbar, Ectopic atrial beats, Frequent urination, Full dentures, Gait instability, GERD (gastroesophageal reflux disease), History of iron deficiency anemia (11/2013), History of stomach ulcers (11/2013), Hypertension, LBBB (left bundle branch block), NICM (nonischemic cardiomyopathy) ([place]) (last echo 12-31-2015 ef 55-60%), Nocturia more than twice per night, and Scoliosis. Surgical: [name]  has a past surgical history that includes Esophagogastroduodenoscopy (N/A, 11/28/2013); Transurethral resection of bladder tumor (N/A, 12/13/2013); Transurethral resection of bladder tumor (N/A, 01/17/2014); Cataract extraction w/ intraocular lens  implant, bilateral (Bilateral, right 12-2013 / left  02-2014); Cystoscopy with retrograde pyelogram, ureteroscopy and stent placement (Bilateral, 04/18/2014); Transurethral resection of bladder tumor (N/A, 04/18/2014); Lumbar disc surgery (1980's); bi-ventricular implantable cardioverter defibrillator (N/A, 05/07/2014); Cystoscopy w/ retrogrades (Bilateral, 01/06/2015); Cystoscopy with biopsy (N/A, 10/06/2015); Cardiac catheterization (03-03-2014  dr Kathlyn [place]   [facility]); Transurethral resection of bladder (1995); Cardiovascular stress test (11/25/2013); transthoracic echocardiogram (12/31/2015   dr Caryl Comes); and Cystoscopy with fulgeration (N/A, 06/30/2017). Family: family history includes Cancer in her brother and  mother.  Constitutional Exam  General appearance: Well nourished, well developed, and well hydrated. In no apparent acute distress Vitals:   02/06/18 1154  BP: (!) 145/89  Pulse: 75  Resp: 16  Temp: 98.2 F (36.8 C)  TempSrc: Oral  SpO2: 99%  Weight: 120 lb (54.4 kg)  Height: 5' 8" (1.727 m)   BMI Assessment: Estimated body mass index is 18.25 kg/m as calculated from the following:   Height as of this encounter: 5' 8" (1.727 m).   Weight as of this encounter: 120 lb (54.4 kg).  BMI interpretation table: BMI level Category Range  association with higher incidence of chronic pain  <18 kg/m2 Underweight   18.5-24.9 kg/m2 Ideal body weight   25-29.9 kg/m2 Overweight Increased incidence by 20%  30-34.9 kg/m2 Obese (Class I) Increased incidence by 68%  35-39.9 kg/m2 Severe obesity (Class II) Increased incidence by 136%  >40 kg/m2 Extreme obesity (Class III) Increased incidence by 254%   Patient's current BMI Ideal Body weight  Body mass index is 18.25 kg/m. Ideal body weight: 63.9 kg (140 lb 14 oz)   BMI Readings from Last 4 Encounters:  02/06/18 18.25 kg/m  12/12/17 19.01 kg/m  11/14/17 19.01 kg/m  10/09/17 19.01 kg/m   Wt Readings from Last 4 Encounters:  02/06/18 120 lb (54.4 kg)  12/12/17 125 lb (56.7 kg)  11/14/17 125 lb (56.7 kg)  10/09/17 125 lb (56.7 kg)  Psych/Mental status: Alert, oriented x 3 (person, place, & time)       Eyes: PERLA Respiratory: No evidence of acute respiratory distress  Cervical Spine Area Exam  Skin & Axial Inspection: No masses, redness, edema, swelling, or associated skin lesions Alignment: Symmetrical Functional ROM: Unrestricted ROM      Stability: No instability detected Muscle Tone/Strength: Functionally intact. No obvious neuro-muscular anomalies detected. Sensory (Neurological): Unimpaired Palpation: No palpable anomalies              Upper Extremity (UE) Exam    Side: Right upper extremity  Side: Left upper extremity   Skin & Extremity Inspection: Skin color, temperature, and hair growth are WNL. No peripheral edema or cyanosis. No masses, redness, swelling, asymmetry, or associated skin lesions. No contractures.  Skin & Extremity Inspection: Skin color, temperature, and hair growth are WNL. No peripheral edema or cyanosis. No masses, redness, swelling, asymmetry, or associated skin lesions. No contractures.  Functional ROM: Unrestricted ROM          Functional ROM: Unrestricted ROM          Muscle Tone/Strength: Functionally intact. No obvious neuro-muscular anomalies detected.  Muscle Tone/Strength: Functionally intact. No obvious neuro-muscular anomalies detected.  Sensory (Neurological): Unimpaired          Sensory (Neurological): Unimpaired          Palpation: No palpable anomalies              Palpation: No palpable anomalies              Provocative Test(s):  Phalen's test: deferred Tinel's test: deferred Apley's scratch test (touch opposite shoulder):  Action 1 (Across chest): deferred Action 2 (Overhead): deferred Action 3 (LB reach): deferred   Provocative Test(s):  Phalen's test: deferred Tinel's test: deferred Apley's scratch test (touch opposite shoulder):  Action 1 (Across chest): deferred Action 2 (Overhead): deferred Action 3 (LB reach): deferred    Thoracic Spine Area Exam  Skin & Axial Inspection: No masses, redness, or swelling Alignment: Asymmetric Functional ROM: Decreased ROM Stability: No instability detected Muscle Tone/Strength: Functionally intact. No obvious neuro-muscular anomalies detected. Sensory (Neurological): Musculoskeletal pain pattern Muscle strength & Tone: No palpable anomalies  Lumbar Spine Area Exam  Skin & Axial Inspection: No masses, redness, or swelling Alignment: Levoscoliosis Functional ROM: Decreased ROM       Stability: No instability detected Muscle Tone/Strength: Increased muscle tone over affected area Sensory (Neurological): Dermatomal pain  pattern Palpation: No palpable anomalies       Provocative Tests: Hyperextension/rotation test: deferred today       Lumbar quadrant test (Kemp's test): deferred today       Lateral bending  test: (+) ipsilateral radicular pain, bilaterally. Positive for bilateral foraminal stenosis. Patrick's Maneuver: deferred today                   FABER test: deferred today                   S-I anterior distraction/compression test: deferred today         S-I lateral compression test: deferred today         S-I Thigh-thrust test: deferred today         S-I Gaenslen's test: deferred today          Gait & Posture Assessment  Ambulation: Patient ambulates using a walker Gait: Limited. Using assistive device to ambulate Posture: Difficulty standing up straight, due to pain   Lower Extremity Exam    Side: Right lower extremity  Side: Left lower extremity  Stability: No instability observed          Stability: No instability observed          Skin & Extremity Inspection: Skin color, temperature, and hair growth are WNL. No peripheral edema or cyanosis. No masses, redness, swelling, asymmetry, or associated skin lesions. No contractures.  Skin & Extremity Inspection: Skin color, temperature, and hair growth are WNL. No peripheral edema or cyanosis. No masses, redness, swelling, asymmetry, or associated skin lesions. No contractures.  Functional ROM: Unrestricted ROM                  Functional ROM: Unrestricted ROM                  Muscle Tone/Strength: Functionally intact. No obvious neuro-muscular anomalies detected.  Muscle Tone/Strength: Functionally intact. No obvious neuro-muscular anomalies detected.  Sensory (Neurological): Dermatomal pain pattern top of foot (L5)  Sensory (Neurological): Musculoskeletal pain pattern   DTR: Patellar: deferred today Achilles: deferred today Plantar: deferred today  DTR: Patellar: deferred today Achilles: deferred today Plantar: deferred today  Palpation: No  palpable anomalies  Palpation: No palpable anomalies   Assessment  Primary Diagnosis & Pertinent Problem List: The primary encounter diagnosis was Chronic pain syndrome. Diagnoses of Lumbar radiculopathy, Lumbar degenerative disc disease, Spinal stenosis, lumbar region, with neurogenic claudication, and Lumbar spondylosis were also pertinent to this visit.  Status Diagnosis  Persistent Persistent Persistent 1. Chronic pain syndrome   2. Lumbar radiculopathy   3. Lumbar degenerative disc disease   4. Spinal stenosis, lumbar region, with neurogenic claudication   5. Lumbar spondylosis     Problems updated and reviewed during this visit: Problem  Chronic Pain Syndrome  Lumbar Radiculopathy  Spinal Stenosis, Lumbar Region, With Neurogenic Claudication   Plan of Care  Pharmacotherapy (Medications Ordered): Meds ordered this encounter  Medications  . DISCONTD: HYDROcodone-acetaminophen (NORCO) 10-325 MG tablet    Sig: Take 1 tablet by mouth every 4 (four) hours as needed for severe pain.    Dispense:  180 tablet    Refill:  0    Do not place this medication, or any other prescription from our practice, on "Automatic Refill". Patient may have prescription filled one day early if pharmacy is closed on scheduled refill date.  To fill on or after: 03/03/2018, 04/02/2018, 05/02/2018  . DISCONTD: HYDROcodone-acetaminophen (NORCO) 10-325 MG tablet    Sig: Take 1 tablet by mouth every 4 (four) hours as needed for severe pain.    Dispense:  180 tablet    Refill:  0    Do not place this medication,  or any other prescription from our practice, on "Automatic Refill". Patient may have prescription filled one day early if pharmacy is closed on scheduled refill date.  To fill on or after: 03/03/2018, 04/02/2018, 05/02/2018  . HYDROcodone-acetaminophen (NORCO) 10-325 MG tablet    Sig: Take 1 tablet by mouth every 4 (four) hours as needed for severe pain.    Dispense:  180 tablet    Refill:  0    Do  not place this medication, or any other prescription from our practice, on "Automatic Refill". Patient may have prescription filled one day early if pharmacy is closed on scheduled refill date.  To fill on or after: 03/03/2018, 04/02/2018, 05/02/2018   Lab-work, procedure(s), and/or referral(s): Orders Placed This Encounter  Procedures  . ToxASSURE Select 13 (MW), Urine    Provider-requested follow-up: Return in about 3 months (around 05/09/2018) for Medication Management.  Future Appointments  Date Time Provider [place]  02/06/2018  4:30 PM Spurgeon, PT [facility] None  02/08/2018  2:30 PM Mansfield, Macy S, PT [facility] None  02/12/2018  1:45 PM Mansfield, Kristine S, PT [facility] None  02/14/2018  1:45 PM Mansfield, Kristine S, PT [facility] None  02/19/2018  1:45 PM Mansfield, Kristine S, PT [facility] None  02/21/2018  1:45 PM Mansfield, Kristine S, PT [facility] None  02/26/2018  1:45 PM Mansfield, [place] S, PT [facility] None  02/27/2018  7:40 AM [facility] DEVICE REMOTES [facility]  02/28/2018  1:45 PM Alanson Puls, PT [facility] None  04/05/2018 11:00 AM Edgardo Roys, PsyD [facility] CPR  05/08/2018  1:30 PM [name], MD [facility] None  06/13/2018  3:00 PM O'Brien-Blaney, Bryson Corona, LPN [facility]    Primary Care Physician: [name], MD Location: [facility] Note by: [name], M.D Date: 02/06/2018; Time: 1:20 PM  Patient Instructions  You have been given 3 Rx for Hydrocodone with acetaminophen to last until 06/01/2018.

## 2018-02-06 NOTE — Patient Instructions (Signed)
You have been given 3 Rx for Hydrocodone with acetaminophen to last until 06/01/2018.

## 2018-02-06 NOTE — Progress Notes (Signed)
Nursing Pain Medication Assessment:  Safety precautions to be maintained throughout the outpatient stay will include: orient to surroundings, keep bed in low position, maintain call bell within reach at all times, provide assistance with transfer out of bed and ambulation.  Medication Inspection Compliance: Pill count conducted under aseptic conditions, in front of the patient. Neither the pills nor the bottle was removed from the patient's sight at any time. Once count was completed pills were immediately returned to the patient in their original bottle.  Medication: Hydrocodone/APAP Pill/Patch Count: 159.5 of 180 pills remain Pill/Patch Appearance: Markings consistent with prescribed medication Bottle Appearance: Standard pharmacy container. Clearly labeled. Filled Date: 3 / 15 / 2019 Last Medication intake:  Today

## 2018-02-07 ENCOUNTER — Ambulatory Visit: Payer: Medicare Other | Admitting: Physical Therapy

## 2018-02-08 ENCOUNTER — Ambulatory Visit: Payer: Medicare Other | Admitting: Physical Therapy

## 2018-02-08 ENCOUNTER — Encounter: Payer: Self-pay | Admitting: Physical Therapy

## 2018-02-08 DIAGNOSIS — M6281 Muscle weakness (generalized): Secondary | ICD-10-CM | POA: Diagnosis not present

## 2018-02-08 DIAGNOSIS — R2681 Unsteadiness on feet: Secondary | ICD-10-CM

## 2018-02-08 NOTE — Therapy (Addendum)
[place] MAIN [facility] SERVICES [address] [place], [place], [address] Phone: [phone]   Fax:  [phone]  Physical Therapy Treatment  Patient Details  Name: [name] MRN: [id number] Date of Birth: 15-May-1940 Referring Provider (PT): Dr. Manuella Ghazi   Encounter Date: 02/08/2018  PT End of Session - 02/08/18 1439    Visit Number  6   Number of Visits  13    Date for PT Re-Evaluation  02/14/18    Authorization Type  Progress note 5/10, last goals: 01/03/18    PT Start Time  0235    PT Stop Time  0315    PT Time Calculation (min)  40 min    Equipment Utilized During Treatment  Gait belt    Activity Tolerance  Patient tolerated treatment well    Behavior During Therapy  [facility] for tasks assessed/performed       Past Medical History:  Diagnosis Date  . 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 Arthritis    "in about all my joints; for sure in my back"  . Bladder cancer [facility]) urologist-  dr Junious Silk   dx 1995--  recurrent bladder cancer 2015 , s/p TURBT's and chemo instillation's  . Chronic hyponatremia   . Chronic lower back pain   . Chronic systolic (congestive) heart failure [facility])    cardiologist-  dr Rogue Jury  . COPD with emphysema ([place])   . Coronary artery disease cardiologist-  dr Kathlyn [place]   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% per cardiac cath 03-03-2014  . DDD (degenerative disc disease), thoracolumbar   . Ectopic atrial beats    2015  alternating BBB  . Frequent urination   . Full dentures   . Gait instability    due to chronic low back pain, uses roller walker  . GERD (gastroesophageal reflux disease)   . History of iron deficiency anemia 11/2013   resolved w/ IV Iron  . History of stomach ulcers 11/2013  . Hypertension   . LBBB (left bundle branch block)   . NICM (nonischemic cardiomyopathy) ([place]) last echo 12-31-2015 ef 55-60%    dx 09/ 2015 per echo 20%;  myoview 09/ 2015 ef 28%;  per cardiac cath 12/ 2015 ef 30%;    . Nocturia more than twice per night   . Scoliosis     Past Surgical History:  Procedure Laterality Date  . BI-VENTRICULAR IMPLANTABLE CARDIOVERTER DEFIBRILLATOR N/A 05/07/2014   Procedure: BI-VENTRICULAR IMPLANTABLE CARDIOVERTER DEFIBRILLATOR  (CRT-D);  Surgeon: Deboraha Sprang, MD;  Location: [facility] CATH LAB;  Service: Cardiovascular;  Laterality: N/A;  . CARDIAC CATHETERIZATION  03-03-2014  dr Kathlyn [place]   [facility]   pLAD 20%, pRCA 20%, dRCA 50%, RPLS 50%;  ef 30%, mild elevated LVEDP, mild gradient across aortic valve LVOT  . CARDIOVASCULAR STRESS TEST  11/25/2013   High risk nuclear study w/ large high severity inferior wall perfusion defect on stress and rest images, large mild severity anteroseptal wall perfusion defect on stress and rest images, No inducible ischemia/ global moderate hypokinesis, ef 28%  . CATARACT EXTRACTION W/ INTRAOCULAR LENS  IMPLANT, BILATERAL Bilateral right 12-2013 / left  02-2014  . CYSTOSCOPY W/ RETROGRADES Bilateral 01/06/2015   Procedure: CYSTOSCOPY WITH  BLADDER BIOPSY BILATERAL RETROGRADE PYELOGRAM,INSTILLATION OF MITOMYCIN C;  Surgeon: [name], MD;  Location: [facility];  Service: Urology;  Laterality: Bilateral;  . CYSTOSCOPY WITH BIOPSY  N/A 10/06/2015   Procedure: CYSTO WITH BLADDER BIOPSY, FULGERATION, CHEMO IRRIGATION EPIRUBICIN IN PACU;  Surgeon: [name], MD;  Location: [facility];  Service: Urology;  Laterality: N/A;  . CYSTOSCOPY WITH FULGERATION N/A 06/30/2017   Procedure: Marland Kitchen CYSTOSCOPY WITH [place] FULGERATION/ BLADDER BIOPSY/ INSTILLATION OF EPIRUBICIN;  Surgeon: [name], MD;  Location: [facility];  Service: Urology;  Laterality: N/A;  . CYSTOSCOPY WITH RETROGRADE PYELOGRAM, URETEROSCOPY AND STENT PLACEMENT Bilateral 04/18/2014   Procedure: CYSTOSCOPY WITH RETROGRADE PYELOGRAM;  Surgeon: [name], MD;  Location:  [facility];  Service: Urology;  Laterality: Bilateral;  . ESOPHAGOGASTRODUODENOSCOPY N/A 11/28/2013   Procedure: ESOPHAGOGASTRODUODENOSCOPY (EGD);  Surgeon: Arta Silence, MD;  Location: [facility] ENDOSCOPY;  Service: Endoscopy;  Laterality: N/A;  . LUMBAR [place]  1980's   "ruptured disc"  . TRANSTHORACIC ECHOCARDIOGRAM  12/31/2015   dr Caryl Comes   ef 39-767, grade 1 diastolic dysfunction/ mild MR/ septal motion showed abnormal function and dyssynergy  . TRANSURETHRAL RESECTION OF BLADDER  1995  . TRANSURETHRAL RESECTION OF BLADDER TUMOR N/A 12/13/2013   Procedure: TRANSURETHRAL RESECTION OF BLADDER TUMOR (TURBT);  Surgeon: [name], MD;  Location: [facility];  Service: Urology;  Laterality: N/A;  . TRANSURETHRAL RESECTION OF BLADDER TUMOR N/A 01/17/2014   Procedure: TRANSURETHRAL RESECTION OF BLADDER TUMOR (TURBT);  Surgeon: [name], MD;  Location: [facility];  Service: Urology;  Laterality: N/A;  . TRANSURETHRAL RESECTION OF BLADDER TUMOR N/A 04/18/2014   Procedure: TRANSURETHRAL RESECTION OF BLADDER TUMOR (TURBT), CYSTOGRAM;  Surgeon: [name], MD;  Location: [facility];  Service: Urology;  Laterality: N/A;    There were no vitals filed for this visit.  Subjective Assessment - 02/08/18 1438    Subjective  Patient reports she continues to have back pain today; wants to continue working on her strength and balance.     Pertinent History  "I have trouble with my balance." Pt reports that difficulty with balance started in 2014 and has been slowly worsening. She denies any tremors but does state that she has trouble writing. States that she has a hard time controlling the pen. Her medical record indicates history of parkinson's like symptoms. She states that she is unable to vacuum or perform housekeeping secondary to imbalance. She feels like her legs are going to buckle on her. She states that her toes are beginning to curl under on her right foot. Pt had a brain CT in 12/2016 without any  notable findings. Lumbar CT from 05/2016 showed multilevel spondylosis. PMH includes cervical, thoracic, and lumbar spinal stenosis. She is unable to have an MRI due to AICD. She also has a history or progressive back pack which started in 2015. She has seen two neurosurgeons and they stated that her back problem may require major surgery but they didn't feel like she was a good surgical candidate. She has been seeing Dr. Holley Raring at the pain clinic and he has referred her to another neurosurgeon. Her appointment with neurosurgery is scheduled for Friday.     Limitations  Walking    Diagnostic tests  Lumbar and head CT (see history). Unable to have MRI secondary to AICD    Patient Stated Goals  Improve balance and leg strength. Decrease fall risk    Currently in Pain?  No/denies    Pain Score  0-No pain    Pain Onset  More than a month ago    Multiple Pain Sites  No       Treatment; Seated LE ankle  PF x 20  Standing terminal knee extension with gTB x 10 x 2 with 5 sec hold Standing with single step fwd/bwd and opposite leg still Side stepping with UE assist x 5 x 5 laps Standing and lateral steps x 20 Standing and fwd taps x 20 Standing and stepping over line side to side and stepping fwd/bwd x 20 Instructed in HEP for 3 new exercises: Terminal knee extension with theraband Ankle pump with theraband PF Standing will wall behind her and 2 chairs side by side and stepping fwd and back to original position Patient needs min assist with all exercises and has no reports of pain but does have ankle and leg fatigue                        PT Education - 02/08/18 1438    Education Details  exercise technique/form, HEP    Person(s) Educated  Patient    Methods  Explanation    Comprehension  Verbalized understanding;Returned demonstration;Need further instruction       PT Short Term Goals - 01/03/18 1502      PT SHORT TERM GOAL #1   Title  Pt will be independent with HEP  in order to improve strength and balance in order to decrease fall risk and improve function at home and work.     Time  3    Period  Weeks    Status  New    Target Date  01/24/18        PT Long Term Goals - 01/03/18 1503      PT LONG TERM GOAL #1   Title  Pt will improve BERG by at least 3 points in order to demonstrate clinically significant improvement in balance.    Baseline  01/03/18: 11/56    Time  6    Period  Weeks    Status  New    Target Date  02/14/18      PT LONG TERM GOAL #2   Title  Pt will improve ABC by at least 13% in order to demonstrate clinically significant improvement in balance confidence.     Baseline  01/03/18: 39.4%    Time  6    Period  Weeks    Status  New    Target Date  02/14/18      PT LONG TERM GOAL #3   Title  Pt will decrease 5TSTS by at least 3 seconds in order to demonstrate clinically significant improvement in LE strength.    Baseline  01/03/18: 33 seconds    Time  6    Period  Weeks    Status  New    Target Date  02/14/18      PT LONG TERM GOAL #4   Title  Pt will decrease TUG to below 14 seconds/decrease 23% in order to demonstrate decreased fall risk.    Baseline  01/03/18: 16.3 seconds    Time  6    Period  Weeks    Status  New    Target Date  02/14/18              Patient will benefit from skilled therapeutic intervention in order to improve the following deficits and impairments:     Visit Diagnosis: Unsteadiness on feet  Muscle weakness (generalized)     Problem List Patient Active Problem List   Diagnosis Date Noted  . Chronic pain syndrome 02/06/2018  . Lumbar radiculopathy 02/06/2018  .  Lumbar spondylosis 07/11/2017  . SI joint arthritis 07/11/2017  . Lumbar degenerative disc disease 07/11/2017  . Hyperlipidemia 03/26/2017  . S/P placement of cardiac pacemaker 03/26/2017  . Coronary artery disease 03/26/2017  . 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 Hypertension 09/06/2016  . Spinal stenosis,  lumbar region, 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/18/2016  . Other idiopathic scoliosis, lumbar region 08/18/2016  . NSAID induced gastritis 05/26/2016  . Chronic bilateral low back pain 05/15/2016  . Spinal stenosis in cervical region 01/19/2014  . Constipation 12/29/2013  . Spinal stenosis of thoracolumbar region 12/27/2013  . COPD (chronic obstructive pulmonary disease) ([place]) 12/27/2013  . History of gastric ulcer 11/28/2013  . Congestive dilated cardiomyopathy ([place]) 11/25/2013  . Bladder cancer ([place]) 11/24/2013  . LBBB (left bundle branch block) 11/24/2013  . Atrial ectopy 11/24/2013  . Palpitations 11/22/2013  . Hyponatremia 11/22/2013    Alanson Puls, PT DPT 02/08/2018, 3:12 PM  [place] MAIN [facility] SERVICES [address] [place], [place], [address] Phone: [phone]   Fax:  [phone]  Name: [name] MRN: [id number] Date of Birth: [birth year]

## 2018-02-10 LAB — TOXASSURE SELECT 13 (MW), URINE

## 2018-02-12 ENCOUNTER — Ambulatory Visit: Payer: Medicare Other | Admitting: Physical Therapy

## 2018-02-13 ENCOUNTER — Ambulatory Visit: Payer: Medicare Other | Admitting: Student in an Organized Health Care Education/Training Program

## 2018-02-14 ENCOUNTER — Ambulatory Visit: Payer: Medicare Other | Admitting: Physical Therapy

## 2018-02-14 ENCOUNTER — Encounter: Payer: Self-pay | Admitting: Physical Therapy

## 2018-02-14 DIAGNOSIS — M6281 Muscle weakness (generalized): Secondary | ICD-10-CM | POA: Diagnosis not present

## 2018-02-14 DIAGNOSIS — R2681 Unsteadiness on feet: Secondary | ICD-10-CM

## 2018-02-14 NOTE — Therapy (Addendum)
[place] MAIN [facility] SERVICES [address] [place], [place], [address] Phone: [phone]   Fax:  [phone]  Physical Therapy Treatment Physical Therapy Progress Note   Dates of reporting period  01/03/2018   to   02/14/2018   Patient Details  Name: [name] MRN: [id number] Date of Birth: [birth year] Referring Provider (PT): Dr. Manuella Ghazi   Encounter Date: 02/14/2018  PT End of Session - 02/14/18 1346    Visit Number  7    Number of Visits  13    Date for PT Re-Evaluation  02/14/18    Authorization Type  Progress note 1/10, last goals: 02/14/2018    PT Start Time  1350    PT Stop Time  1430    PT Time Calculation (min)  40 min    Equipment Utilized During Treatment  Gait belt    Activity Tolerance  Patient tolerated treatment well    Behavior During Therapy  WFL for tasks assessed/performed       Past Medical History:  Diagnosis Date  . 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 Arthritis    "in about all my joints; for sure in my back"  . Bladder cancer [facility]) urologist-  dr Junious Silk   dx 1995--  recurrent bladder cancer 2015 , s/p TURBT's and chemo instillation's  . Chronic hyponatremia   . Chronic lower back pain   . Chronic systolic (congestive) heart failure [facility])    cardiologist-  dr Rogue Jury  . COPD with emphysema ([place])   . Coronary artery disease cardiologist-  dr Kathlyn [place]   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% per cardiac cath 03-03-2014  . DDD (degenerative disc disease), thoracolumbar   . Ectopic atrial beats    2015  alternating BBB  . Frequent urination   . Full dentures   . Gait instability    due to chronic low back pain, uses roller walker  . GERD (gastroesophageal reflux disease)   . History of iron deficiency anemia 11/2013   resolved w/ IV Iron  . History of stomach ulcers 11/2013  . Hypertension   . LBBB (left  bundle branch block)   . NICM (nonischemic cardiomyopathy) ([place]) last echo 12-31-2015 ef 55-60%   dx 09/ 2015 per echo 20%;  myoview 09/ 2015 ef 28%;  per cardiac cath 12/ 2015 ef 30%;    . Nocturia more than twice per night   . Scoliosis     Past Surgical History:  Procedure Laterality Date  . BI-VENTRICULAR IMPLANTABLE CARDIOVERTER DEFIBRILLATOR N/A 05/07/2014   Procedure: BI-VENTRICULAR IMPLANTABLE CARDIOVERTER DEFIBRILLATOR  (CRT-D);  Surgeon: Deboraha Sprang, MD;  Location: [facility] CATH LAB;  Service: Cardiovascular;  Laterality: N/A;  . CARDIAC CATHETERIZATION  03-03-2014  dr Kathlyn [place]   [facility]   pLAD 20%, pRCA 20%, dRCA 50%, RPLS 50%;  ef 30%, mild elevated LVEDP, mild gradient across aortic valve LVOT  . CARDIOVASCULAR STRESS TEST  11/25/2013   High risk nuclear study w/ large high severity inferior wall perfusion defect on stress and rest images, large mild severity anteroseptal wall perfusion defect on stress and rest images, No inducible ischemia/ global moderate hypokinesis, ef 28%  . CATARACT EXTRACTION W/ INTRAOCULAR LENS  IMPLANT, BILATERAL Bilateral right 12-2013 / left  02-2014  . CYSTOSCOPY W/ RETROGRADES Bilateral 01/06/2015   Procedure: CYSTOSCOPY WITH  BLADDER BIOPSY BILATERAL RETROGRADE PYELOGRAM,INSTILLATION OF MITOMYCIN C;  Surgeon: [name], MD;  Location: [facility] ORS;  Service: Urology;  Laterality: Bilateral;  . CYSTOSCOPY WITH BIOPSY N/A 10/06/2015   Procedure: CYSTO WITH BLADDER BIOPSY, FULGERATION, CHEMO IRRIGATION EPIRUBICIN IN PACU;  Surgeon: [name], MD;  Location: [facility];  Service: Urology;  Laterality: N/A;  . CYSTOSCOPY WITH FULGERATION N/A 06/30/2017   Procedure: Marland Kitchen CYSTOSCOPY WITH [place] FULGERATION/ BLADDER BIOPSY/ INSTILLATION OF EPIRUBICIN;  Surgeon: [name], MD;  Location: [facility];  Service: Urology;  Laterality: N/A;  . CYSTOSCOPY WITH RETROGRADE PYELOGRAM, URETEROSCOPY AND STENT PLACEMENT Bilateral 04/18/2014    Procedure: CYSTOSCOPY WITH RETROGRADE PYELOGRAM;  Surgeon: [name], MD;  Location: [facility];  Service: Urology;  Laterality: Bilateral;  . ESOPHAGOGASTRODUODENOSCOPY N/A 11/28/2013   Procedure: ESOPHAGOGASTRODUODENOSCOPY (EGD);  Surgeon: Arta Silence, MD;  Location: [facility] ENDOSCOPY;  Service: Endoscopy;  Laterality: N/A;  . LUMBAR [place]  1980's   "ruptured disc"  . TRANSTHORACIC ECHOCARDIOGRAM  12/31/2015   dr Caryl Comes   ef 36-644, grade 1 diastolic dysfunction/ mild MR/ septal motion showed abnormal function and dyssynergy  . TRANSURETHRAL RESECTION OF BLADDER  1995  . TRANSURETHRAL RESECTION OF BLADDER TUMOR N/A 12/13/2013   Procedure: TRANSURETHRAL RESECTION OF BLADDER TUMOR (TURBT);  Surgeon: [name], MD;  Location: [facility];  Service: Urology;  Laterality: N/A;  . TRANSURETHRAL RESECTION OF BLADDER TUMOR N/A 01/17/2014   Procedure: TRANSURETHRAL RESECTION OF BLADDER TUMOR (TURBT);  Surgeon: [name], MD;  Location: [facility];  Service: Urology;  Laterality: N/A;  . TRANSURETHRAL RESECTION OF BLADDER TUMOR N/A 04/18/2014   Procedure: TRANSURETHRAL RESECTION OF BLADDER TUMOR (TURBT), CYSTOGRAM;  Surgeon: [name], MD;  Location: [facility];  Service: Urology;  Laterality: N/A;    There were no vitals filed for this visit.  Subjective Assessment - 02/14/18 1347    Subjective   Patient states that she is feeling discouraged because her balance is not where she wants it to be. Patient denies any falls or stumbles since her last visit. Patient reports that her back pain continues to be a problem. Patient will be going to a neurosurgeon for her back pain on Mar 19, 2018.    Pertinent History  "I have trouble with my balance." Pt reports that difficulty with balance started in 2014 and has been slowly worsening. She denies any tremors but does state that she has trouble writing. States that she has a hard time controlling the pen. Her medical record indicates history of  parkinson's like symptoms. She states that she is unable to vacuum or perform housekeeping secondary to imbalance. She feels like her legs are going to buckle on her. She states that her toes are beginning to curl under on her right foot. Pt had a brain CT in 12/2016 without any notable findings. Lumbar CT from 05/2016 showed multilevel spondylosis. PMH includes cervical, thoracic, and lumbar spinal stenosis. She is unable to have an MRI due to AICD. She also has a history or progressive back pack which started in 2015. She has seen two neurosurgeons and they stated that her back problem may require major surgery but they didn't feel like she was a good surgical candidate. She has been seeing Dr. Holley Raring at the pain clinic and he has referred her to another neurosurgeon. Her appointment with neurosurgery is scheduled for Friday.     Currently in Pain?  Yes    Pain Score  7     Pain Location  Back    Pain Orientation  Lower;Left  Pain Descriptors / Indicators  Aching    Pain Type  Chronic pain         [facility] PT Assessment - 02/14/18 0001      Berg Balance Test   Sit to Stand  Able to stand  independently using hands    Standing Unsupported  Able to stand 30 seconds unsupported    Sitting with Back Unsupported but Feet Supported on Floor or Stool  Able to sit safely and securely 2 minutes    Stand to Sit  Controls descent by using hands    Transfers  Able to transfer safely, definite need of hands    Standing Unsupported with Eyes Closed  Able to stand 3 seconds    Standing Ubsupported with Feet Together  Able to place feet together independently but unable to hold for 30 seconds    From Standing, Reach Forward with Outstretched Arm  Reaches forward but needs supervision    From Standing Position, Pick up Object from Floor  Unable to try/needs assist to keep balance    From Standing Position, Turn to Look Behind Over each Shoulder  Needs supervision when turning    Turn 360 Degrees  Needs close  supervision or verbal cueing    Standing Unsupported, Alternately Place Feet on Step/Stool  Needs assistance to keep from falling or unable to try    Standing Unsupported, One Foot in [employer] balance while stepping or standing    Standing on One Leg  Unable to try or needs assist to prevent fall    Total Score  22          TREATMENT Outcome measures 5TSTS, TUG, Berg Balance STS x10 w/ UE support Patient educated on prognosis, HEP, overall progress, and encouraged to maintain efforts as they are directly improving her balance and mobility.   ASSESSMENT  Patient presents to clinic with continued deficits in standing balance, BLE strength, and mobility. Patient demonstrated vast improvement in functional outcomes: Berg balance 100% improvement: 22/56 from 11/56 on evaluation; TUG 13% decrease; 5TSTS 55% decrease in time to complete. While she has made significant gains she still presents as a high fall risk and will benefit from continued skilled therapeutic intervention in order to address these deficits and prevent falls as well as improve overall QOL. Patient's condition has the potential to improve in response to therapy. Maximum improvement is yet to be obtained. The anticipated improvement is attainable and reasonable in a generally predictable time.    PT Education - 02/14/18 1439    Education Details  POC, progress, safety, balance     Person(s) Educated  Patient    Methods  Explanation;Demonstration;Verbal cues    Comprehension  Verbalized understanding;Need further instruction;Returned demonstration       PT Short Term Goals - 02/14/18 1357      PT SHORT TERM GOAL #1   Title  Pt will be independent with HEP in order to improve strength and balance in order to decrease fall risk and improve function at home and work.     Time  3    Period  Weeks    Status  Achieved    Target Date  01/24/18        PT Long Term Goals - 02/14/18 1404      PT LONG TERM GOAL #1    Title  Pt will improve BERG by at least 3 points in order to demonstrate clinically significant improvement in balance.    Baseline  01/03/18: 11/56; 02/14/18: 22/56    Time  6    Period  Weeks    Status  On-going    Target Date  03/28/18      PT LONG TERM GOAL #2   Title  Pt will improve ABC by at least 13% in order to demonstrate clinically significant improvement in balance confidence.     Baseline  01/03/18: 39.4% 02/14/18: patient declined due to time    Time  6    Period  Weeks    Status  On-going    Target Date  03/28/18      PT LONG TERM GOAL #3   Title  Pt will decrease 5TSTS with no UE support by at least 3 seconds in order to demonstrate clinically significant improvement in LE strength.    Baseline  01/03/18: 33 seconds; 02/14/18: 17.2 seconds (UE support)    Time  6    Period  Weeks    Status  On-going    Target Date  03/28/18      PT LONG TERM GOAL #4   Title  Pt will decrease TUG to below 14 seconds/decrease 23% in order to demonstrate decreased fall risk.    Baseline  01/03/18: 16.3 seconds; 02/14/18 (w/ rollator): 14.2 seconds (13% decrease)    Time  6    Period  Weeks    Status  On-going    Target Date  03/28/18            Plan - 02/14/18 1440    Clinical Impression Statement  Patient presents to clinic with continued deficits in standing balance, BLE strength, and mobility. Patient demonstrated vast improvement in functional outcomes: Berg balance 100% improvement: 22/56 from 11/56 on evaluation; TUG 13% decrease; 5TSTS 55% decrease in time to complete. While she has made significant gains she still presents as a high fall risk and will benefit from continued skilled therapeutic intervention in order to address these deficits and prevent falls as well as improve overall QOL. Patient's condition has the potential to improve in response to therapy. Maximum improvement is yet to be obtained. The anticipated improvement is attainable and reasonable in a generally  predictable time.     Rehab Potential  Fair    PT Frequency  2x / week    PT Duration  6 weeks    PT Treatment/Interventions  ADLs/Self Care Home Management;Aquatic Therapy;Canalith Repostioning;Cryotherapy;Electrical Stimulation;Iontophoresis 4mg /ml Dexamethasone;Moist Heat;Traction;Ultrasound;DME Instruction;Gait training;Stair training;Functional mobility training;Therapeutic activities;Therapeutic exercise;Balance training;Neuromuscular re-education;Cognitive remediation;Patient/family education;Manual techniques;Dry needling;Energy conservation;Vestibular;Visual/perceptual remediation/compensation    PT Next Visit Plan  Progress balance and strengthening, progress HEP as pt is able    PT Home Exercise Plan  Hooklying bridges 2 x 10 BID, Feet together EO balance 30s x 3 BID    Consulted and Agree with Plan of Care  Patient       Patient will benefit from skilled therapeutic intervention in order to improve the following deficits and impairments:  Abnormal gait, Decreased balance, Decreased strength, Pain  Visit Diagnosis: Unsteadiness on feet  Muscle weakness (generalized)     Problem List Patient Active Problem List   Diagnosis Date Noted  . Chronic pain syndrome 02/06/2018  . Lumbar radiculopathy 02/06/2018  . Lumbar spondylosis 07/11/2017  . SI joint arthritis 07/11/2017  . Lumbar degenerative disc disease 07/11/2017  . Hyperlipidemia 03/26/2017  . S/P placement of cardiac pacemaker 03/26/2017  . Coronary artery disease 03/26/2017  . 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 Hypertension 09/06/2016  . Spinal stenosis, lumbar region,  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/18/2016  . Other idiopathic scoliosis, lumbar region 08/18/2016  . NSAID induced gastritis 05/26/2016  . Chronic bilateral low back pain 05/15/2016  . Spinal stenosis in cervical region 01/19/2014  . Constipation 12/29/2013  . Spinal stenosis of thoracolumbar region 12/27/2013  . COPD (chronic obstructive  pulmonary disease) ([place]) 12/27/2013  . History of gastric ulcer 11/28/2013  . Congestive dilated cardiomyopathy ([place]) 11/25/2013  . Bladder cancer ([place]) 11/24/2013  . LBBB (left bundle branch block) 11/24/2013  . Atrial ectopy 11/24/2013  . Palpitations 11/22/2013  . Hyponatremia 11/22/2013   Myles Gip PT, DPT [phone] 02/14/2018, 2:45 PM  [place] MAIN [facility] SERVICES [address] [place], [place], [address] Phone: [phone]   Fax:  [phone]  Name: [name] MRN: [id number] Date of Birth: 02/15/1941

## 2018-02-14 NOTE — Addendum Note (Signed)
Addended by: Louie Casa on: 02/14/2018 02:54 PM   Modules accepted: Orders

## 2018-02-18 ENCOUNTER — Other Ambulatory Visit: Payer: Self-pay | Admitting: Internal Medicine

## 2018-02-19 ENCOUNTER — Ambulatory Visit: Payer: Medicare Other | Attending: Neurology | Admitting: Physical Therapy

## 2018-02-19 DIAGNOSIS — R2681 Unsteadiness on feet: Secondary | ICD-10-CM | POA: Diagnosis not present

## 2018-02-19 DIAGNOSIS — M6281 Muscle weakness (generalized): Secondary | ICD-10-CM | POA: Insufficient documentation

## 2018-02-19 NOTE — Therapy (Addendum)
[place] MAIN [facility] SERVICES [address] [place], [place], [address] Phone: [phone]   Fax:  [phone]  Physical Therapy Treatment  Patient Details  Name: [name] MRN: [id number] Date of Birth: 05/31/1940 Referring Provider (PT): Dr. Manuella Ghazi   Encounter Date: 02/19/2018  PT End of Session - 02/19/18 1358    Visit Number  8   Number of Visits  18    Authorization Type  Progress note 1/10, last goals: 02/14/2018    PT Start Time  1350    PT Stop Time  1430    PT Time Calculation (min)  40 min    Activity Tolerance  Patient tolerated treatment well    Behavior During Therapy  [facility] for tasks assessed/performed       Past Medical History:  Diagnosis Date  . 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 Arthritis    "in about all my joints; for sure in my back"  . Bladder cancer [facility]) urologist-  dr Junious Silk   dx 1995--  recurrent bladder cancer 2015 , s/p TURBT's and chemo instillation's  . Chronic hyponatremia   . Chronic lower back pain   . Chronic systolic (congestive) heart failure [facility])    cardiologist-  dr Rogue Jury  . COPD with emphysema ([place])   . Coronary artery disease cardiologist-  dr Kathlyn [place]   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% per cardiac cath 03-03-2014  . DDD (degenerative disc disease), thoracolumbar   . Ectopic atrial beats    2015  alternating BBB  . Frequent urination   . Full dentures   . Gait instability    due to chronic low back pain, uses roller walker  . GERD (gastroesophageal reflux disease)   . History of iron deficiency anemia 11/2013   resolved w/ IV Iron  . History of stomach ulcers 11/2013  . Hypertension   . LBBB (left bundle branch block)   . NICM (nonischemic cardiomyopathy) ([place]) last echo 12-31-2015 ef 55-60%   dx 09/ 2015 per echo 20%;  myoview 09/ 2015 ef 28%;  per cardiac cath 12/ 2015 ef 30%;     . Nocturia more than twice per night   . Scoliosis     Past Surgical History:  Procedure Laterality Date  . BI-VENTRICULAR IMPLANTABLE CARDIOVERTER DEFIBRILLATOR N/A 05/07/2014   Procedure: BI-VENTRICULAR IMPLANTABLE CARDIOVERTER DEFIBRILLATOR  (CRT-D);  Surgeon: Deboraha Sprang, MD;  Location: [facility] CATH LAB;  Service: Cardiovascular;  Laterality: N/A;  . CARDIAC CATHETERIZATION  03-03-2014  dr Kathlyn [place]   [facility]   pLAD 20%, pRCA 20%, dRCA 50%, RPLS 50%;  ef 30%, mild elevated LVEDP, mild gradient across aortic valve LVOT  . CARDIOVASCULAR STRESS TEST  11/25/2013   High risk nuclear study w/ large high severity inferior wall perfusion defect on stress and rest images, large mild severity anteroseptal wall perfusion defect on stress and rest images, No inducible ischemia/ global moderate hypokinesis, ef 28%  . CATARACT EXTRACTION W/ INTRAOCULAR LENS  IMPLANT, BILATERAL Bilateral right 12-2013 / left  02-2014  . CYSTOSCOPY W/ RETROGRADES Bilateral 01/06/2015   Procedure: CYSTOSCOPY WITH  BLADDER BIOPSY BILATERAL RETROGRADE PYELOGRAM,INSTILLATION OF MITOMYCIN C;  Surgeon: [name], MD;  Location: [facility];  Service: Urology;  Laterality: Bilateral;  . CYSTOSCOPY WITH BIOPSY N/A 10/06/2015   Procedure: CYSTO WITH BLADDER BIOPSY, FULGERATION, CHEMO IRRIGATION EPIRUBICIN IN PACU;  Surgeon: [name],  MD;  Location: [facility];  Service: Urology;  Laterality: N/A;  . CYSTOSCOPY WITH FULGERATION N/A 06/30/2017   Procedure: Marland Kitchen CYSTOSCOPY WITH [place] FULGERATION/ BLADDER BIOPSY/ INSTILLATION OF EPIRUBICIN;  Surgeon: [name], MD;  Location: [facility];  Service: Urology;  Laterality: N/A;  . CYSTOSCOPY WITH RETROGRADE PYELOGRAM, URETEROSCOPY AND STENT PLACEMENT Bilateral 04/18/2014   Procedure: CYSTOSCOPY WITH RETROGRADE PYELOGRAM;  Surgeon: [name], MD;  Location: [facility];  Service: Urology;  Laterality: Bilateral;  . ESOPHAGOGASTRODUODENOSCOPY N/A  11/28/2013   Procedure: ESOPHAGOGASTRODUODENOSCOPY (EGD);  Surgeon: Arta Silence, MD;  Location: [facility] ENDOSCOPY;  Service: Endoscopy;  Laterality: N/A;  . LUMBAR [place]  1980's   "ruptured disc"  . TRANSTHORACIC ECHOCARDIOGRAM  12/31/2015   dr Caryl Comes   ef 91-638, grade 1 diastolic dysfunction/ mild MR/ septal motion showed abnormal function and dyssynergy  . TRANSURETHRAL RESECTION OF BLADDER  1995  . TRANSURETHRAL RESECTION OF BLADDER TUMOR N/A 12/13/2013   Procedure: TRANSURETHRAL RESECTION OF BLADDER TUMOR (TURBT);  Surgeon: [name], MD;  [facility];  Service: Urology;  Laterality: N/A;  . TRANSURETHRAL RESECTION OF BLADDER TUMOR N/A 01/17/2014   Procedure: TRANSURETHRAL RESECTION OF BLADDER TUMOR (TURBT);  Surgeon: [name], MD;  Location: [facility];  Service: Urology;  Laterality: N/A;  . TRANSURETHRAL RESECTION OF BLADDER TUMOR N/A 04/18/2014   Procedure: TRANSURETHRAL RESECTION OF BLADDER TUMOR (TURBT), CYSTOGRAM;  Surgeon: [name], MD;  [facility];  Service: Urology;  Laterality: N/A;    There were no vitals filed for this visit.  Subjective Assessment - 02/19/18 1353    Subjective  Pt doing well in general, still feels her balance improvements have been unimpressive She reports persistent pain in her back and hip similar to baseline.     Pertinent History  "I have trouble with my balance." Pt reports that difficulty with balance started in 2014 and has been slowly worsening. She denies any tremors but does state that she has trouble writing. States that she has a hard time controlling the pen. Her medical record indicates history of parkinson's like symptoms. She states that she is unable to vacuum or perform housekeeping secondary to imbalance. She feels like her legs are going to buckle on her. She states that her toes are beginning to curl under on her right foot. Pt had a brain CT in 12/2016 without any notable findings. Lumbar CT from 05/2016  showed multilevel spondylosis. PMH includes cervical, thoracic, and lumbar spinal stenosis. She is unable to have an MRI due to AICD. She also has a history or progressive back pack which started in 2015. She has seen two neurosurgeons and they stated that her back problem may require major surgery but they didn't feel like she was a good surgical candidate. She has been seeing Dr. Holley Raring at the pain clinic and he has referred her to another neurosurgeon. Her appointment with neurosurgery is scheduled for Friday.     Currently in Pain?  Yes    Pain Score  6     Pain Location  --   central low back aching         Treatment; -Seated LE ankle PF2x15, starting in Dflexion, forefeet on airex pad  -seated marching 1x15 bilat, VC/tactile cues for trunk stabilization without UE assist -STS from chair + airex 2x10, hands free with pause at top to establish balance,  CGA for balance.  -Side stepping intermittent UE assist PRN only x5 laps -Retro AMB in // bars 5x  -  Minisquat in //bars 2x10, verbal cues for TKE -Narrow stance/firm surface trunk/head turns: vertical and horizontal 10each  (unable to complete as this is too advanced)  -Normal stance on firm surface 2 total minutes, several LOB with intermittent minA for falls recovery. (in //bars)     PT Short Term Goals - 02/14/18 1357      PT SHORT TERM GOAL #1   Title  Pt will be independent with HEP in order to improve strength and balance in order to decrease fall risk and improve function at home and work.     Time  3    Period  Weeks    Status  Achieved    Target Date  01/24/18        PT Long Term Goals - 02/14/18 1404      PT LONG TERM GOAL #1   Title  Pt will improve BERG by at least 3 points in order to demonstrate clinically significant improvement in balance.    Baseline  01/03/18: 11/56; 02/14/18: 22/56    Time  6    Period  Weeks    Status  On-going    Target Date  03/28/18      PT LONG TERM GOAL #2   Title  Pt will  improve ABC by at least 13% in order to demonstrate clinically significant improvement in balance confidence.     Baseline  01/03/18: 39.4% 02/14/18: patient declined due to time    Time  6    Period  Weeks    Status  On-going    Target Date  03/28/18      PT LONG TERM GOAL #3   Title  Pt will decrease 5TSTS with no UE support by at least 3 seconds in order to demonstrate clinically significant improvement in LE strength.    Baseline  01/03/18: 33 seconds; 02/14/18: 17.2 seconds (UE support)    Time  6    Period  Weeks    Status  On-going    Target Date  03/28/18      PT LONG TERM GOAL #4   Title  Pt will decrease TUG to below 14 seconds/decrease 23% in order to demonstrate decreased fall risk.    Baseline  01/03/18: 16.3 seconds; 02/14/18 (w/ rollator): 14.2 seconds (13% decrease)    Time  6    Period  Weeks    Status  On-going    Target Date  03/28/18            Plan - 02/19/18 1359    Clinical Impression Statement  Pt doing well this date, able to progres many of the activities carried over prior sessions. Pt often needs extensive verbal or tactile cuing for form, but demonstrates good overall understanding and carryover wthout repeated explanation. Balance continues to be a major limiting facor but patien tis able to stabilize self wth BUE as needed. VC for TKE to decrease knee buckling. Pt demonstrates consistent difficulty with knee buckling loss of trunk support, and retropulsion/leaning throughout with limited capacity to self correct.    Rehab Potential  Fair    PT Frequency  2x / week    PT Duration  6 weeks    PT Treatment/Interventions  ADLs/Self Care Home Management;Aquatic Therapy;Canalith Repostioning;Cryotherapy;Electrical Stimulation;Iontophoresis 4mg /ml Dexamethasone;Moist Heat;Traction;Ultrasound;DME Instruction;Gait training;Stair training;Functional mobility training;Therapeutic activities;Therapeutic exercise;Balance training;Neuromuscular  re-education;Cognitive remediation;Patient/family education;Manual techniques;Dry needling;Energy conservation;Vestibular;Visual/perceptual remediation/compensation    PT Next Visit Plan  Progress balance and strengthening, progress HEP as pt is able  PT Home Exercise Plan  Hooklying bridges 2 x 10 BID, Feet together EO balance 30s x 3 BID    Consulted and Agree with Plan of Care  Patient       Patient will benefit from skilled therapeutic intervention in order to improve the following deficits and impairments:  Abnormal gait, Decreased balance, Decreased strength, Pain  Visit Diagnosis: Unsteadiness on feet  Muscle weakness (generalized)     Problem List Patient Active Problem List   Diagnosis Date Noted  . Chronic pain syndrome 02/06/2018  . Lumbar radiculopathy 02/06/2018  . Lumbar spondylosis 07/11/2017  . SI joint arthritis 07/11/2017  . Lumbar degenerative disc disease 07/11/2017  . Hyperlipidemia 03/26/2017  . S/P placement of cardiac pacemaker 03/26/2017  . Coronary artery disease 03/26/2017  . 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 Hypertension 09/06/2016  . Spinal stenosis, lumbar region, 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/18/2016  . Other idiopathic scoliosis, lumbar region 08/18/2016  . NSAID induced gastritis 05/26/2016  . Chronic bilateral low back pain 05/15/2016  . Spinal stenosis in cervical region 01/19/2014  . Constipation 12/29/2013  . Spinal stenosis of thoracolumbar region 12/27/2013  . COPD (chronic obstructive pulmonary disease) ([place]) 12/27/2013  . History of gastric ulcer 11/28/2013  . Congestive dilated cardiomyopathy ([place]) 11/25/2013  . Bladder cancer ([place]) 11/24/2013  . LBBB (left bundle branch block) 11/24/2013  . Atrial ectopy 11/24/2013  . Palpitations 11/22/2013  . Hyponatremia 11/22/2013    Marlene Beidler C 02/19/2018, 2:08 PM  2:13 PM, 02/19/18 Etta Grandchild, PT, DPT Physical Therapist - [facility]   Outpatient Physical Therapy- [place] MAIN [facility] SERVICES [address] [place], [place], [address] Phone: [phone]   Fax:  [phone]  Name: MAKAILYN MCCORMICK MRN: [id number] Date of Birth: [birth year]

## 2018-02-21 ENCOUNTER — Ambulatory Visit: Payer: Medicare Other | Admitting: Physical Therapy

## 2018-02-21 DIAGNOSIS — C674 Malignant neoplasm of posterior wall of bladder: Secondary | ICD-10-CM | POA: Diagnosis not present

## 2018-02-26 ENCOUNTER — Ambulatory Visit: Payer: Medicare Other | Admitting: Physical Therapy

## 2018-02-26 ENCOUNTER — Encounter: Payer: Self-pay | Admitting: Physical Therapy

## 2018-02-26 DIAGNOSIS — R2681 Unsteadiness on feet: Secondary | ICD-10-CM

## 2018-02-26 DIAGNOSIS — M6281 Muscle weakness (generalized): Secondary | ICD-10-CM

## 2018-02-26 NOTE — Therapy (Addendum)
[place] MAIN [facility] SERVICES [address] [place], [place], [address] Phone: [phone]   Fax:  [phone]  Physical Therapy Treatment  Patient Details  Name: [name] MRN: [id number] Date of Birth: 11/09/1940 Referring Provider (PT): Dr. Manuella Ghazi   Encounter Date: 02/26/2018  PT End of Session - 02/26/18 1351    Visit Number  9    Number of Visits  18    Authorization Type  Progress note 2/10, last goals: 02/14/2018    PT Start Time  0145    PT Stop Time  0230    PT Time Calculation (min)  45 min    Activity Tolerance  Patient tolerated treatment well    Behavior During Therapy  [facility] for tasks assessed/performed       Past Medical History:  Diagnosis Date  . 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 Arthritis    "in about all my joints; for sure in my back"  . Bladder cancer [facility]) urologist-  dr Junious Silk   dx 1995--  recurrent bladder cancer 2015 , s/p TURBT's and chemo instillation's  . Chronic hyponatremia   . Chronic lower back pain   . Chronic systolic (congestive) heart failure [facility])    cardiologist-  dr Rogue Jury  . COPD with emphysema ([place])   . Coronary artery disease cardiologist-  dr Kathlyn [place]   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% per cardiac cath 03-03-2014  . DDD (degenerative disc disease), thoracolumbar   . Ectopic atrial beats    2015  alternating BBB  . Frequent urination   . Full dentures   . Gait instability    due to chronic low back pain, uses roller walker  . GERD (gastroesophageal reflux disease)   . History of iron deficiency anemia 11/2013   resolved w/ IV Iron  . History of stomach ulcers 11/2013  . Hypertension   . LBBB (left bundle branch block)   . NICM (nonischemic cardiomyopathy) ([place]) last echo 12-31-2015 ef 55-60%   dx 09/ 2015 per echo 20%;  myoview 09/ 2015 ef 28%;  per cardiac cath 12/ 2015 ef 30%;     . Nocturia more than twice per night   . Scoliosis     Past Surgical History:  Procedure Laterality Date  . BI-VENTRICULAR IMPLANTABLE CARDIOVERTER DEFIBRILLATOR N/A 05/07/2014   Procedure: BI-VENTRICULAR IMPLANTABLE CARDIOVERTER DEFIBRILLATOR  (CRT-D);  Surgeon: Deboraha Sprang, MD;  Location: [facility] CATH LAB;  Service: Cardiovascular;  Laterality: N/A;  . CARDIAC CATHETERIZATION  03-03-2014  dr Kathlyn [place]   [facility]   pLAD 20%, pRCA 20%, dRCA 50%, RPLS 50%;  ef 30%, mild elevated LVEDP, mild gradient across aortic valve LVOT  . CARDIOVASCULAR STRESS TEST  11/25/2013   High risk nuclear study w/ large high severity inferior wall perfusion defect on stress and rest images, large mild severity anteroseptal wall perfusion defect on stress and rest images, No inducible ischemia/ global moderate hypokinesis, ef 28%  . CATARACT EXTRACTION W/ INTRAOCULAR LENS  IMPLANT, BILATERAL Bilateral right 12-2013 / left  02-2014  . CYSTOSCOPY W/ RETROGRADES Bilateral 01/06/2015   Procedure: CYSTOSCOPY WITH  BLADDER BIOPSY BILATERAL RETROGRADE PYELOGRAM,INSTILLATION OF MITOMYCIN C;  Surgeon: [name], MD;  Location[facility];  Service: Urology;  Laterality: Bilateral;  . CYSTOSCOPY WITH BIOPSY N/A 10/06/2015   Procedure: CYSTO WITH BLADDER BIOPSY, FULGERATION, [place] EPIRUBICIN IN PACU;  Surgeon: Rodman Key  Junious Silk, MD;  Location: [facility];  Service: Urology;  Laterality: N/A;  . CYSTOSCOPY WITH FULGERATION N/A 06/30/2017   Procedure: Marland Kitchen CYSTOSCOPY WITH [place] FULGERATION/ BLADDER BIOPSY/ INSTILLATION OF EPIRUBICIN;  Surgeon: [name], MD;  Location: [facility];  Service: Urology;  Laterality: N/A;  . CYSTOSCOPY WITH RETROGRADE PYELOGRAM, URETEROSCOPY AND STENT PLACEMENT Bilateral 04/18/2014   Procedure: CYSTOSCOPY WITH RETROGRADE PYELOGRAM;  Surgeon: [name], MD;  Location: [facility];  Service: Urology;  Laterality: Bilateral;  . ESOPHAGOGASTRODUODENOSCOPY N/A  11/28/2013   Procedure: ESOPHAGOGASTRODUODENOSCOPY (EGD);  Surgeon: Arta Silence, MD;  Location: [facility] ENDOSCOPY;  Service: Endoscopy;  Laterality: N/A;  . LUMBAR [place]  1980's   "ruptured disc"  . TRANSTHORACIC ECHOCARDIOGRAM  12/31/2015   dr Caryl Comes   ef 32-440, grade 1 diastolic dysfunction/ mild MR/ septal motion showed abnormal function and dyssynergy  . TRANSURETHRAL RESECTION OF BLADDER  1995  . TRANSURETHRAL RESECTION OF BLADDER TUMOR N/A 12/13/2013   Procedure: TRANSURETHRAL RESECTION OF BLADDER TUMOR (TURBT);  Surgeon: [name], MD;  [facility];  Service: Urology;  Laterality: N/A;  . TRANSURETHRAL RESECTION OF BLADDER TUMOR N/A 01/17/2014   Procedure: TRANSURETHRAL RESECTION OF BLADDER TUMOR (TURBT);  Surgeon: [name], MD;  [facility];  Service: Urology;  Laterality: N/A;  . TRANSURETHRAL RESECTION OF BLADDER TUMOR N/A 04/18/2014   Procedure: TRANSURETHRAL RESECTION OF BLADDER TUMOR (TURBT), CYSTOGRAM;  Surgeon: [name], MD;  [facility]: [facility];  Service: Urology;  Laterality: N/A;    There were no vitals filed for this visit.  Subjective Assessment - 02/26/18 1351    Subjective  Pt doing well in general, still feels her balance improvements have been unimpressive She reports persistent pain in her back and hip similar to baseline.     Pertinent History  "I have trouble with my balance." Pt reports that difficulty with balance started in 2014 and has been slowly worsening. She denies any tremors but does state that she has trouble writing. States that she has a hard time controlling the pen. Her medical record indicates history of parkinson's like symptoms. She states that she is unable to vacuum or perform housekeeping secondary to imbalance. She feels like her legs are going to buckle on her. She states that her toes are beginning to curl under on her right foot. Pt had a brain CT in 12/2016 without any notable findings. Lumbar CT from 05/2016  showed multilevel spondylosis. PMH includes cervical, thoracic, and lumbar spinal stenosis. She is unable to have an MRI due to AICD. She also has a history or progressive back pack which started in 2015. She has seen two neurosurgeons and they stated that her back problem may require major surgery but they didn't feel like she was a good surgical candidate. She has been seeing Dr. Holley Raring at the pain clinic and he has referred her to another neurosurgeon. Her appointment with neurosurgery is scheduled for Friday.     Limitations  Walking    Diagnostic tests  Lumbar and head CT (see history). Unable to have MRI secondary to AICD    Patient Stated Goals  Improve balance and leg strength. Decrease fall risk    Pain Onset  More than a month ago        Therapeutic exercise:  nustep level 3 for warm up (unbilled) x65mins  Bridges x 10   hooklying marching x 20 with TA  Hooklying ER/abd x 20 with TA  sidelying hip abd x 10  Tandem  standing in parallel bars x 2 mins with each leg leading  Standing onfoam 50 % UE support on parallel bars  Stepping over 1 hurdle bwd x10 cues for posture, increased hip flexion  Standing hip abd with GTB  resistance 2x5 bilaterally  Standing hip extension withGTB 2x5BLE    Cues for core activationof TAin standing to reduce backpainand for improved posture. Patient needed several sitting rest breaks during session.                        PT Education - 02/26/18 1351    Education Details  HEP    Person(s) Educated  Patient    Methods  Explanation    Comprehension  Verbalized understanding       PT Short Term Goals - 02/14/18 1357      PT SHORT TERM GOAL #1   Title  Pt will be independent with HEP in order to improve strength and balance in order to decrease fall risk and improve function at home and work.     Time  3    Period  Weeks    Status  Achieved    Target Date  01/24/18        PT Long Term Goals  - 02/14/18 1404      PT LONG TERM GOAL #1   Title  Pt will improve BERG by at least 3 points in order to demonstrate clinically significant improvement in balance.    Baseline  01/03/18: 11/56; 02/14/18: 22/56    Time  6    Period  Weeks    Status  On-going    Target Date  03/28/18      PT LONG TERM GOAL #2   Title  Pt will improve ABC by at least 13% in order to demonstrate clinically significant improvement in balance confidence.     Baseline  01/03/18: 39.4% 02/14/18: patient declined due to time    Time  6    Period  Weeks    Status  On-going    Target Date  03/28/18      PT LONG TERM GOAL #3   Title  Pt will decrease 5TSTS with no UE support by at least 3 seconds in order to demonstrate clinically significant improvement in LE strength.    Baseline  01/03/18: 33 seconds; 02/14/18: 17.2 seconds (UE support)    Time  6    Period  Weeks    Status  On-going    Target Date  03/28/18      PT LONG TERM GOAL #4   Title  Pt will decrease TUG to below 14 seconds/decrease 23% in order to demonstrate decreased fall risk.    Baseline  01/03/18: 16.3 seconds; 02/14/18 (w/ rollator): 14.2 seconds (13% decrease)    Time  6    Period  Weeks    Status  On-going    Target Date  03/28/18            Plan - 02/26/18 1352    Clinical Impression Statement  Patient required min verbal cueing during matrix machine stepping, and required CGA during all dynamic standing balance activities. Patient required occasional rest breaks between exercises due to fatigue. Patient tolerated exercise well. Patient will continue to benefit from skilled therapy in order to improve dynamic standing balance activities and increase gait speed to reduce risk for falls    Rehab Potential  Fair    PT Frequency  2x / week  PT Duration  6 weeks    PT Treatment/Interventions  ADLs/Self Care Home Management;Aquatic Therapy;Canalith Repostioning;Cryotherapy;Electrical Stimulation;Iontophoresis 4mg /ml  Dexamethasone;Moist Heat;Traction;Ultrasound;DME Instruction;Gait training;Stair training;Functional mobility training;Therapeutic activities;Therapeutic exercise;Balance training;Neuromuscular re-education;Cognitive remediation;Patient/family education;Manual techniques;Dry needling;Energy conservation;Vestibular;Visual/perceptual remediation/compensation    PT Next Visit Plan  Progress balance and strengthening, progress HEP as pt is able    PT Home Exercise Plan  Hooklying bridges 2 x 10 BID, Feet together EO balance 30s x 3 BID    Consulted and Agree with Plan of Care  Patient       Patient will benefit from skilled therapeutic intervention in order to improve the following deficits and impairments:  Abnormal gait, Decreased balance, Decreased strength, Pain  Visit Diagnosis: Unsteadiness on feet  Muscle weakness (generalized)     Problem List Patient Active Problem List   Diagnosis Date Noted  . Chronic pain syndrome 02/06/2018  . Lumbar radiculopathy 02/06/2018  . Lumbar spondylosis 07/11/2017  . SI joint arthritis 07/11/2017  . Lumbar degenerative disc disease 07/11/2017  . Hyperlipidemia 03/26/2017  . S/P placement of cardiac pacemaker 03/26/2017  . Coronary artery disease 03/26/2017  . 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 Hypertension 09/06/2016  . Spinal stenosis, lumbar region, 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/18/2016  . Other idiopathic scoliosis, lumbar region 08/18/2016  . NSAID induced gastritis 05/26/2016  . Chronic bilateral low back pain 05/15/2016  . Spinal stenosis in cervical region 01/19/2014  . Constipation 12/29/2013  . Spinal stenosis of thoracolumbar region 12/27/2013  . COPD (chronic obstructive pulmonary disease) ([place]) 12/27/2013  . History of gastric ulcer 11/28/2013  . Congestive dilated cardiomyopathy ([place]) 11/25/2013  . Bladder cancer ([place]) 11/24/2013  . LBBB (left bundle branch block) 11/24/2013  . Atrial ectopy 11/24/2013  . Palpitations  11/22/2013  . Hyponatremia 11/22/2013    Alanson Puls, PT DPT 02/26/2018, 2:29 PM  [place] MAIN [facility] SERVICES [address] [place], [place], [address] Phone: [phone]   Fax:  [phone]  Name: [name] MRN: [id number] Date of Birth: [birth year]

## 2018-02-27 ENCOUNTER — Other Ambulatory Visit: Payer: Self-pay

## 2018-02-27 ENCOUNTER — Ambulatory Visit (INDEPENDENT_AMBULATORY_CARE_PROVIDER_SITE_OTHER): Payer: Medicare Other

## 2018-02-27 DIAGNOSIS — I428 Other cardiomyopathies: Secondary | ICD-10-CM | POA: Diagnosis not present

## 2018-02-27 DIAGNOSIS — I420 Dilated cardiomyopathy: Secondary | ICD-10-CM

## 2018-02-27 DIAGNOSIS — I5022 Chronic systolic (congestive) heart failure: Secondary | ICD-10-CM

## 2018-02-27 MED ORDER — METOPROLOL SUCCINATE ER 25 MG PO TB24: 25.0000 mg | ORAL_TABLET | Freq: Every day | ORAL | 0 refills | Status: DC

## 2018-02-28 ENCOUNTER — Ambulatory Visit: Payer: Medicare Other | Admitting: Physical Therapy

## 2018-02-28 ENCOUNTER — Other Ambulatory Visit: Payer: Self-pay

## 2018-02-28 DIAGNOSIS — I420 Dilated cardiomyopathy: Secondary | ICD-10-CM

## 2018-02-28 MED ORDER — METOPROLOL SUCCINATE ER 25 MG PO TB24: 25.0000 mg | ORAL_TABLET | Freq: Every day | ORAL | 3 refills | Status: DC

## 2018-03-01 NOTE — Progress Notes (Signed)
Remote ICD transmission.   

## 2018-03-06 ENCOUNTER — Ambulatory Visit: Payer: Medicare Other | Admitting: Physical Therapy

## 2018-03-08 ENCOUNTER — Encounter: Payer: Self-pay | Admitting: Physical Therapy

## 2018-03-08 ENCOUNTER — Ambulatory Visit: Payer: Medicare Other | Admitting: Physical Therapy

## 2018-03-08 DIAGNOSIS — R2681 Unsteadiness on feet: Secondary | ICD-10-CM | POA: Diagnosis not present

## 2018-03-08 DIAGNOSIS — M6281 Muscle weakness (generalized): Secondary | ICD-10-CM | POA: Diagnosis not present

## 2018-03-08 NOTE — Therapy (Addendum)
[place] MAIN [facility] SERVICES [address] [place], [place], [address] Phone: [phone]   Fax:  [phone]  Physical Therapy Treatment  Patient Details  Name: [name] MRN: [id number] Date of Birth: 1941/03/01 Referring Provider (PT): Dr. Manuella Ghazi   Encounter Date: 03/08/2018  PT End of Session - 03/08/18 1312    Visit Number  10   Number of Visits  18    Date for PT Re-Evaluation  03/28/18    Authorization Type  Progress note 1/10, last goals: 02/14/2018    PT Start Time  0105    PT Stop Time  0145    PT Time Calculation (min)  40 min    Activity Tolerance  Patient tolerated treatment well    Behavior During Therapy  [facility] for tasks assessed/performed       Past Medical History:  Diagnosis Date  . 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 Arthritis    "in about all my joints; for sure in my back"  . Bladder cancer [facility]) urologist-  dr Junious Silk   dx 1995--  recurrent bladder cancer 2015 , s/p TURBT's and chemo instillation's  . Chronic hyponatremia   . Chronic lower back pain   . Chronic systolic (congestive) heart failure [facility])    cardiologist-  dr Rogue Jury  . COPD with emphysema ([place])   . Coronary artery disease cardiologist-  dr Kathlyn [place]   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% per cardiac cath 03-03-2014  . DDD (degenerative disc disease), thoracolumbar   . Ectopic atrial beats    2015  alternating BBB  . Frequent urination   . Full dentures   . Gait instability    due to chronic low back pain, uses roller walker  . GERD (gastroesophageal reflux disease)   . History of iron deficiency anemia 11/2013   resolved w/ IV Iron  . History of stomach ulcers 11/2013  . Hypertension   . LBBB (left bundle branch block)   . NICM (nonischemic cardiomyopathy) ([place]) last echo 12-31-2015 ef 55-60%   dx 09/ 2015 per echo 20%;  myoview 09/ 2015  ef 28%;  per cardiac cath 12/ 2015 ef 30%;    . Nocturia more than twice per night   . Scoliosis     Past Surgical History:  Procedure Laterality Date  . BI-VENTRICULAR IMPLANTABLE CARDIOVERTER DEFIBRILLATOR N/A 05/07/2014   Procedure: BI-VENTRICULAR IMPLANTABLE CARDIOVERTER DEFIBRILLATOR  (CRT-D);  Surgeon: Deboraha Sprang, MD;  Location: [facility] C[facility] LAB;  Service: Cardiovascular;  Laterality: N/A;  . CARDIAC CATHETERIZATION  03-03-2014  dr Kathlyn [place]   [facility]   pLAD 20%, pRCA 20%, dRCA 50%, RPLS 50%;  ef 30%, mild elevated LVEDP, mild gradient across aortic valve LVOT  . CARDIOVASCULAR STRESS TEST  11/25/2013   High risk nuclear study w/ large high severity inferior wall perfusion defect on stress and rest images, large mild severity anteroseptal wall perfusion defect on stress and rest images, No inducible ischemia/ global moderate hypokinesis, ef 28%  . CATARACT EXTRACTION W/ INTRAOCULAR LENS  IMPLANT, BILATERAL Bilateral right 12-2013 / left  02-2014  . CYSTOSCOPY W/ RETROGRADES Bilateral 01/06/2015   Procedure: CYSTOSCOPY WITH  BLADDER BIOPSY BILATERAL RETROGRADE PYELOGRAM,INSTILLATION OF MITOMYCIN C;  Surgeon: [name], MD;  Location: [facility] ORS;  Service: Urology;  Laterality: Bilateral;  . CYSTOSCOPY WITH BIOPSY N/A 10/06/2015   Procedure: CYSTO WITH BLADDER BIOPSY, FULGERATION,  CHEMO IRRIGATION EPIRUBICIN IN PACU;  Surgeon: [name], MD;  Location: [facility];  Service: Urology;  Laterality: N/A;  . CYSTOSCOPY WITH FULGERATION N/A 06/30/2017   Procedure: Marland Kitchen CYSTOSCOPY WITH [place] FULGERATION/ BLADDER BIOPSY/ INSTILLATION OF EPIRUBICIN;  Surgeon: [name], MD;  Location: [facility];  Service: Urology;  Laterality: N/A;  . CYSTOSCOPY WITH RETROGRADE PYELOGRAM, URETEROSCOPY AND STENT PLACEMENT Bilateral 04/18/2014   Procedure: CYSTOSCOPY WITH RETROGRADE PYELOGRAM;  Surgeon: [name], MD;  Location: [facility];  Service: Urology;  Laterality:  Bilateral;  . ESOPHAGOGASTRODUODENOSCOPY N/A 11/28/2013   Procedure: ESOPHAGOGASTRODUODENOSCOPY (EGD);  Surgeon: Arta Silence, MD;  Location: [facility] ENDOSCOPY;  Service: Endoscopy;  Laterality: N/A;  . LUMBAR [place]  1980's   "ruptured disc"  . TRANSTHORACIC ECHOCARDIOGRAM  12/31/2015   dr Caryl Comes   ef 47-654, grade 1 diastolic dysfunction/ mild MR/ septal motion showed abnormal function and dyssynergy  . TRANSURETHRAL RESECTION OF BLADDER  1995  . TRANSURETHRAL RESECTION OF BLADDER TUMOR N/A 12/13/2013   Procedure: TRANSURETHRAL RESECTION OF BLADDER TUMOR (TURBT);  Surgeon: [name], MD;  Location: [facility];  Service: Urology;  Laterality: N/A;  . TRANSURETHRAL RESECTION OF BLADDER TUMOR N/A 01/17/2014   Procedure: TRANSURETHRAL RESECTION OF BLADDER TUMOR (TURBT);  Surgeon: [name], MD;  Location: [facility];  Service: Urology;  Laterality: N/A;  . TRANSURETHRAL RESECTION OF BLADDER TUMOR N/A 04/18/2014   Procedure: TRANSURETHRAL RESECTION OF BLADDER TUMOR (TURBT), CYSTOGRAM;  Surgeon: [name], MD;  Location: [facility];  Service: Urology;  Laterality: N/A;    There were no vitals filed for this visit.  Subjective Assessment - 03/08/18 1311    Subjective  Pt doing well in general, still feels her balance improvements have been unimpressive She reports persistent pain in her back and hip similar to baseline.     Pertinent History  "I have trouble with my balance." Pt reports that difficulty with balance started in 2014 and has been slowly worsening. She denies any tremors but does state that she has trouble writing. States that she has a hard time controlling the pen. Her medical record indicates history of parkinson's like symptoms. She states that she is unable to vacuum or perform housekeeping secondary to imbalance. She feels like her legs are going to buckle on her. She states that her toes are beginning to curl under on her right foot. Pt had a brain CT in 12/2016 without any  notable findings. Lumbar CT from 05/2016 showed multilevel spondylosis. PMH includes cervical, thoracic, and lumbar spinal stenosis. She is unable to have an MRI due to AICD. She also has a history or progressive back pack which started in 2015. She has seen two neurosurgeons and they stated that her back problem may require major surgery but they didn't feel like she was a good surgical candidate. She has been seeing Dr. Holley Raring at the pain clinic and he has referred her to another neurosurgeon. Her appointment with neurosurgery is scheduled for Friday.     Limitations  Walking    Diagnostic tests  Lumbar and head CT (see history). Unable to have MRI secondary to AICD    Patient Stated Goals  Improve balance and leg strength. Decrease fall risk    Currently in Pain?  Yes    Pain Score  5     Pain Location  Back    Pain Orientation  Lower    Pain Descriptors / Indicators  Aching    Pain Onset  More than a  month ago       Treatment; Seated LE ankle PF2x15 seated marching 1x15 bilat, VC/tactile cues for trunk stabilization without UE assist-STS from chair + airex 2x10,   CGA for balance.  Side stepping intermittent UE assist PRN only x5 laps Retro AMB in // bars 5x  Minisquat in //bars 2x10, verbal cues for TKE Purple foam to step 10 x 2 sets Normal stance on firm surface 2 total minutes, several LOB with intermittent minA for falls recovery.  Purple foam to side step step 10 x 2 sets CGA and Min to mod verbal cues used throughout with increased in postural sway and LOB most seen with narrow base of support and while on uneven surfaces. Continues to have balance deficits typical with diagnosis. Patient performs intermediate level exercises without pain behaviors and needs verbal cuing.                        PT Education - 03/08/18 1312    Education Details  HEP    Person(s) Educated  Patient    Methods  Explanation    Comprehension  Verbalized understanding       PT  Short Term Goals - 02/14/18 1357      PT SHORT TERM GOAL #1   Title  Pt will be independent with HEP in order to improve strength and balance in order to decrease fall risk and improve function at home and work.     Time  3    Period  Weeks    Status  Achieved    Target Date  01/24/18        PT Long Term Goals - 02/14/18 1404      PT LONG TERM GOAL #1   Title  Pt will improve BERG by at least 3 points in order to demonstrate clinically significant improvement in balance.    Baseline  01/03/18: 11/56; 02/14/18: 22/56    Time  6    Period  Weeks    Status  On-going    Target Date  03/28/18      PT LONG TERM GOAL #2   Title  Pt will improve ABC by at least 13% in order to demonstrate clinically significant improvement in balance confidence.     Baseline  01/03/18: 39.4% 02/14/18: patient declined due to time    Time  6    Period  Weeks    Status  On-going    Target Date  03/28/18      PT LONG TERM GOAL #3   Title  Pt will decrease 5TSTS with no UE support by at least 3 seconds in order to demonstrate clinically significant improvement in LE strength.    Baseline  01/03/18: 33 seconds; 02/14/18: 17.2 seconds (UE support)    Time  6    Period  Weeks    Status  On-going    Target Date  03/28/18      PT LONG TERM GOAL #4   Title  Pt will decrease TUG to below 14 seconds/decrease 23% in order to demonstrate decreased fall risk.    Baseline  01/03/18: 16.3 seconds; 02/14/18 (w/ rollator): 14.2 seconds (13% decrease)    Time  6    Period  Weeks    Status  On-going    Target Date  03/28/18            Plan - 03/08/18 1314    Clinical Impression Statement  Patient demonstrates understanding of  HEP with min corrections needed. Patient challenged closed chain and open chain exercises with multiple repetitions due to fatigue. Weak LE combined with weak core musculature results in fair postural control and  minimal balance deficits.    Rehab Potential  Fair    PT Frequency  2x  / week    PT Duration  6 weeks    PT Treatment/Interventions  ADLs/Self Care Home Management;Aquatic Therapy;Canalith Repostioning;Cryotherapy;Electrical Stimulation;Iontophoresis 4mg /ml Dexamethasone;Moist Heat;Traction;Ultrasound;DME Instruction;Gait training;Stair training;Functional mobility training;Therapeutic activities;Therapeutic exercise;Balance training;Neuromuscular re-education;Cognitive remediation;Patient/family education;Manual techniques;Dry needling;Energy conservation;Vestibular;Visual/perceptual remediation/compensation    PT Next Visit Plan  Progress balance and strengthening, progress HEP as pt is able    PT Home Exercise Plan  Hooklying bridges 2 x 10 BID, Feet together EO balance 30s x 3 BID    Consulted and Agree with Plan of Care  Patient       Patient will benefit from skilled therapeutic intervention in order to improve the following deficits and impairments:  Abnormal gait, Decreased balance, Decreased strength, Pain  Visit Diagnosis: Unsteadiness on feet  Muscle weakness (generalized)     Problem List Patient Active Problem List   Diagnosis Date Noted  . Chronic pain syndrome 02/06/2018  . Lumbar radiculopathy 02/06/2018  . Lumbar spondylosis 07/11/2017  . SI joint arthritis 07/11/2017  . Lumbar degenerative disc disease 07/11/2017  . Hyperlipidemia 03/26/2017  . S/P placement of cardiac pacemaker 03/26/2017  . Coronary artery disease 03/26/2017  . 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 Hypertension 09/06/2016  . Spinal stenosis, lumbar region, 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/18/2016  . Other idiopathic scoliosis, lumbar region 08/18/2016  . NSAID induced gastritis 05/26/2016  . Chronic bilateral low back pain 05/15/2016  . Spinal stenosis in cervical region 01/19/2014  . Constipation 12/29/2013  . Spinal stenosis of thoracolumbar region 12/27/2013  . COPD (chronic obstructive pulmonary disease) ([place]) 12/27/2013  . History of gastric ulcer 11/28/2013   . Congestive dilated cardiomyopathy ([place]) 11/25/2013  . Bladder cancer ([place]) 11/24/2013  . LBBB (left bundle branch block) 11/24/2013  . Atrial ectopy 11/24/2013  . Palpitations 11/22/2013  . Hyponatremia 11/22/2013    Alanson Puls, PT DPT 03/08/2018, 1:15 PM  [place] MAIN [facility] SERVICES [address] [place], [place], [address] Phone: [phone]   Fax:  [phone]  Name: [name] MRN: [id number] Date of Birth: May 01, 1940

## 2018-03-09 DIAGNOSIS — Z5111 Encounter for antineoplastic chemotherapy: Secondary | ICD-10-CM | POA: Diagnosis not present

## 2018-03-09 DIAGNOSIS — C678 Malignant neoplasm of overlapping sites of bladder: Secondary | ICD-10-CM | POA: Diagnosis not present

## 2018-03-12 ENCOUNTER — Encounter: Payer: Self-pay | Admitting: Physical Therapy

## 2018-03-12 ENCOUNTER — Ambulatory Visit: Payer: Medicare Other | Admitting: Physical Therapy

## 2018-03-12 DIAGNOSIS — M6281 Muscle weakness (generalized): Secondary | ICD-10-CM

## 2018-03-12 DIAGNOSIS — R2681 Unsteadiness on feet: Secondary | ICD-10-CM

## 2018-03-12 NOTE — Therapy (Addendum)
[place] MAIN [facility] SERVICES [address] [place], [place], [address] Phone: [phone]   Fax:  [phone]  Physical Therapy Treatment  Patient Details  Name: [name] MRN: [id number] Date of Birth: [birth year] Referring Provider (PT): Dr. Manuella Ghazi   Encounter Date: 03/12/2018  PT End of Session - 03/12/18 1324    Visit Number  11    Number of Visits  18    Authorization Type  Progress note 3/10, last goals: 02/14/2018    PT Start Time  0100    PT Stop Time  0140    PT Time Calculation (min)  40 min    Activity Tolerance  Patient tolerated treatment well    Behavior During Therapy  [facility] for tasks assessed/performed       Past Medical History:  Diagnosis Date  . 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 Arthritis    "in about all my joints; for sure in my back"  . Bladder cancer [facility]) urologist-  dr Junious Silk   dx 1995--  recurrent bladder cancer 2015 , s/p TURBT's and chemo instillation's  . Chronic hyponatremia   . Chronic lower back pain   . Chronic systolic (congestive) heart failure [facility])    cardiologist-  dr Rogue Jury  . COPD with emphysema ([place])   . Coronary artery disease cardiologist-  dr Kathlyn [place]   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% per cardiac cath 03-03-2014  . DDD (degenerative disc disease), thoracolumbar   . Ectopic atrial beats    2015  alternating BBB  . Frequent urination   . Full dentures   . Gait instability    due to chronic low back pain, uses roller walker  . GERD (gastroesophageal reflux disease)   . History of iron deficiency anemia 11/2013   resolved w/ IV Iron  . History of stomach ulcers 11/2013  . Hypertension   . LBBB (left bundle branch block)   . NICM (nonischemic cardiomyopathy) ([place]) last echo 12-31-2015 ef 55-60%   dx 09/ 2015 per echo 20%;  myoview 09/ 2015 ef 28%;  per cardiac cath 12/ 2015 ef  30%;    . Nocturia more than twice per night   . Scoliosis     Past Surgical History:  Procedure Laterality Date  . BI-VENTRICULAR IMPLANTABLE CARDIOVERTER DEFIBRILLATOR N/A 05/07/2014   Procedure: BI-VENTRICULAR IMPLANTABLE CARDIOVERTER DEFIBRILLATOR  (CRT-D);  Surgeon: Deboraha Sprang, MD;  Location: [facility] CATH LAB;  Service: Cardiovascular;  Laterality: N/A;  . CARDIAC CATHETERIZATION  03-03-2014  dr Kathlyn [place]   [facility]   pLAD 20%, pRCA 20%, dRCA 50%, RPLS 50%;  ef 30%, mild elevated LVEDP, mild gradient across aortic valve LVOT  . CARDIOVASCULAR STRESS TEST  11/25/2013   High risk nuclear study w/ large high severity inferior wall perfusion defect on stress and rest images, large mild severity anteroseptal wall perfusion defect on stress and rest images, No inducible ischemia/ global moderate hypokinesis, ef 28%  . CATARACT EXTRACTION W/ INTRAOCULAR LENS  IMPLANT, BILATERAL Bilateral right 12-2013 / left  02-2014  . CYSTOSCOPY W/ RETROGRADES Bilateral 01/06/2015   Procedure: CYSTOSCOPY WITH  BLADDER BIOPSY BILATERAL RETROGRADE PYELOGRAM,INSTILLATION OF MITOMYCIN C;  Surgeon: [name], MD;  Location: [facility] ORS;  Service: Urology;  Laterality: Bilateral;  . CYSTOSCOPY WITH BIOPSY N/A 10/06/2015   Procedure: CYSTO WITH BLADDER BIOPSY, FULGERATION, [place] EPIRUBICIN IN PACU;  Surgeon: Rodman Key  Junious Silk, MD;  Location: [facility] ORS;  Service: Urology;  Laterality: N/A;  . CYSTOSCOPY WITH FULGERATION N/A 06/30/2017   Procedure: Marland Kitchen CYSTOSCOPY WITH [place] FULGERATION/ BLADDER BIOPSY/ INSTILLATION OF EPIRUBICIN;  Surgeon: [name], MD;  Location: [facility];  Service: Urology;  Laterality: N/A;  . CYSTOSCOPY WITH RETROGRADE PYELOGRAM, URETEROSCOPY AND STENT PLACEMENT Bilateral 04/18/2014   Procedure: CYSTOSCOPY WITH RETROGRADE PYELOGRAM;  Surgeon: [name], MD;  Location: [facility] ORS;  Service: Urology;  Laterality: Bilateral;  . ESOPHAGOGASTRODUODENOSCOPY N/A  11/28/2013   Procedure: ESOPHAGOGASTRODUODENOSCOPY (EGD);  Surgeon: Arta Silence, MD;  Location: [facility] ENDOSCOPY;  Service: Endoscopy;  Laterality: N/A;  . LUMBAR [place]  1980's   "ruptured disc"  . TRANSTHORACIC ECHOCARDIOGRAM  12/31/2015   dr Caryl Comes   ef 76-283, grade 1 diastolic dysfunction/ mild MR/ septal motion showed abnormal function and dyssynergy  . TRANSURETHRAL RESECTION OF BLADDER  1995  . TRANSURETHRAL RESECTION OF BLADDER TUMOR N/A 12/13/2013   Procedure: TRANSURETHRAL RESECTION OF BLADDER TUMOR (TURBT);  Surgeon: [name], MD;  Location: [facility] ORS;  Service: Urology;  Laterality: N/A;  . TRANSURETHRAL RESECTION OF BLADDER TUMOR N/A 01/17/2014   Procedure: TRANSURETHRAL RESECTION OF BLADDER TUMOR (TURBT);  Surgeon: [name], MD;  Location: [facility] ORS;  Service: Urology;  Laterality: N/A;  . TRANSURETHRAL RESECTION OF BLADDER TUMOR N/A 04/18/2014   Procedure: TRANSURETHRAL RESECTION OF BLADDER TUMOR (TURBT), CYSTOGRAM;  Surgeon: [name], MD;  Location: [facility] ORS;  Service: Urology;  Laterality: N/A;    There were no vitals filed for this visit.  Subjective Assessment - 03/12/18 1323    Subjective  Pt doing well in general, still feels her balance improvements have been unimpressive     Pertinent History  "I have trouble with my balance." Pt reports that difficulty with balance started in 2014 and has been slowly worsening. She denies any tremors but does state that she has trouble writing. States that she has a hard time controlling the pen. Her medical record indicates history of parkinson's like symptoms. She states that she is unable to vacuum or perform housekeeping secondary to imbalance. She feels like her legs are going to buckle on her. She states that her toes are beginning to curl under on her right foot. Pt had a brain CT in 12/2016 without any notable findings. Lumbar CT from 05/2016 showed multilevel spondylosis. PMH includes cervical, thoracic, and lumbar  spinal stenosis. She is unable to have an MRI due to AICD. She also has a history or progressive back pack which started in 2015. She has seen two neurosurgeons and they stated that her back problem may require major surgery but they didn't feel like she was a good surgical candidate. She has been seeing Dr. Holley Raring at the pain clinic and he has referred her to another neurosurgeon. Her appointment with neurosurgery is scheduled for Friday.     Limitations  Walking    Diagnostic tests  Lumbar and head CT (see history). Unable to have MRI secondary to AICD    Patient Stated Goals  Improve balance and leg strength. Decrease fall risk    Pain Onset  More than a month ago      Neuromuscular training:  Stepping over hurdle fwd/bwd x 10   Stepping sideways over hurdle x 10    Therapeutic exercise:  nustep level 3 for warm up x4 mins  Marching x 10   Mini squat x 10 with 3 sec hold  Bridges x 10   hooklying  marching x 20 with TA  Hooklying ER/abd x 20 with TA  sidelying hip abd x 10  Tandem standing in parallel bars x 2 mins with each leg leading  Standing onfoam 75 % UE support on parallel bars  Stepping over 1 hurdle bwd x10 cues for posture, increased hip flexion  Standing hip abd without resistance due to weakness2x5 bilaterally  Standing hip extension withoutGTB due to increased fatigue today 2x5BLE    Cues for core activationof TAin standing to reduce backpainand for improved posture. Patient needed several sitting rest breaks during session.                        PT Education - 03/12/18 1324    Education Details  HEP    Person(s) Educated  Patient    Methods  Explanation    Comprehension  Verbalized understanding       PT Short Term Goals - 02/14/18 1357      PT SHORT TERM GOAL #1   Title  Pt will be independent with HEP in order to improve strength and balance in order to decrease fall risk and improve function at home  and work.     Time  3    Period  Weeks    Status  Achieved    Target Date  01/24/18        PT Long Term Goals - 02/14/18 1404      PT LONG TERM GOAL #1   Title  Pt will improve BERG by at least 3 points in order to demonstrate clinically significant improvement in balance.    Baseline  01/03/18: 11/56; 02/14/18: 22/56    Time  6    Period  Weeks    Status  On-going    Target Date  03/28/18      PT LONG TERM GOAL #2   Title  Pt will improve ABC by at least 13% in order to demonstrate clinically significant improvement in balance confidence.     Baseline  01/03/18: 39.4% 02/14/18: patient declined due to time    Time  6    Period  Weeks    Status  On-going    Target Date  03/28/18      PT LONG TERM GOAL #3   Title  Pt will decrease 5TSTS with no UE support by at least 3 seconds in order to demonstrate clinically significant improvement in LE strength.    Baseline  01/03/18: 33 seconds; 02/14/18: 17.2 seconds (UE support)    Time  6    Period  Weeks    Status  On-going    Target Date  03/28/18      PT LONG TERM GOAL #4   Title  Pt will decrease TUG to below 14 seconds/decrease 23% in order to demonstrate decreased fall risk.    Baseline  01/03/18: 16.3 seconds; 02/14/18 (w/ rollator): 14.2 seconds (13% decrease)    Time  6    Period  Weeks    Status  On-going    Target Date  03/28/18            Plan - 03/12/18 1325    Clinical Impression Statement  Patient demonstrates LOB with standing balance exercises indicating decreased balancing strategies. Patient did require UE support to perform sidestepping and single leg stes fwd and side stepping. Patient will benefit from further skilled therapy to return to prior level of function. . Pt was encouraged to perform HEP during  the week in order to continue progressing balance and strength interventions. Pt would continue to benefit from skilled therapy services in order to further address LE strength deficits and balance  deficits in order to decrease fall risk and improve mobility.    Rehab Potential  Fair    PT Frequency  2x / week    PT Duration  6 weeks    PT Treatment/Interventions  ADLs/Self Care Home Management;Aquatic Therapy;Canalith Repostioning;Cryotherapy;Electrical Stimulation;Iontophoresis 4mg /ml Dexamethasone;Moist Heat;Traction;Ultrasound;DME Instruction;Gait training;Stair training;Functional mobility training;Therapeutic activities;Therapeutic exercise;Balance training;Neuromuscular re-education;Cognitive remediation;Patient/family education;Manual techniques;Dry needling;Energy conservation;Vestibular;Visual/perceptual remediation/compensation    PT Next Visit Plan  Progress balance and strengthening, progress HEP as pt is able    PT Home Exercise Plan  Hooklying bridges 2 x 10 BID, Feet together EO balance 30s x 3 BID    Consulted and Agree with Plan of Care  Patient       Patient will benefit from skilled therapeutic intervention in order to improve the following deficits and impairments:  Abnormal gait, Decreased balance, Decreased strength, Pain  Visit Diagnosis: Unsteadiness on feet  Muscle weakness (generalized)     Problem List Patient Active Problem List   Diagnosis Date Noted  . Chronic pain syndrome 02/06/2018  . Lumbar radiculopathy 02/06/2018  . Lumbar spondylosis 07/11/2017  . SI joint arthritis 07/11/2017  . Lumbar degenerative disc disease 07/11/2017  . Hyperlipidemia 03/26/2017  . S/P placement of cardiac pacemaker 03/26/2017  . Coronary artery disease 03/26/2017  . 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 Hypertension 09/06/2016  . Spinal stenosis, lumbar region, 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/18/2016  . Other idiopathic scoliosis, lumbar region 08/18/2016  . NSAID induced gastritis 05/26/2016  . Chronic bilateral low back pain 05/15/2016  . Spinal stenosis in cervical region 01/19/2014  . Constipation 12/29/2013  . Spinal stenosis of thoracolumbar region  12/27/2013  . COPD (chronic obstructive pulmonary disease) ([place]) 12/27/2013  . History of gastric ulcer 11/28/2013  . Congestive dilated cardiomyopathy ([place]) 11/25/2013  . Bladder cancer ([place]) 11/24/2013  . LBBB (left bundle branch block) 11/24/2013  . Atrial ectopy 11/24/2013  . Palpitations 11/22/2013  . Hyponatremia 11/22/2013    Alanson Puls, PT DPT 03/12/2018, 1:41 PM  [address] MAIN [facility] SERVICES [address] [place], [place], [address] Phone: [phone]   Fax:  [phone]  Name: LAVERA VANDERMEER MRN: [id number] Date of Birth: 05-14-1940

## 2018-03-19 DIAGNOSIS — M5116 Intervertebral disc disorders with radiculopathy, lumbar region: Secondary | ICD-10-CM | POA: Diagnosis not present

## 2018-03-19 DIAGNOSIS — R918 Other nonspecific abnormal finding of lung field: Secondary | ICD-10-CM | POA: Diagnosis not present

## 2018-03-19 DIAGNOSIS — G834 Cauda equina syndrome: Secondary | ICD-10-CM | POA: Diagnosis not present

## 2018-03-19 DIAGNOSIS — M4805 Spinal stenosis, thoracolumbar region: Secondary | ICD-10-CM | POA: Diagnosis not present

## 2018-03-19 DIAGNOSIS — M5124 Other intervertebral disc displacement, thoracic region: Secondary | ICD-10-CM | POA: Diagnosis not present

## 2018-03-19 DIAGNOSIS — M5127 Other intervertebral disc displacement, lumbosacral region: Secondary | ICD-10-CM | POA: Diagnosis not present

## 2018-03-19 DIAGNOSIS — M48061 Spinal stenosis, lumbar region without neurogenic claudication: Secondary | ICD-10-CM | POA: Diagnosis not present

## 2018-03-20 ENCOUNTER — Ambulatory Visit: Payer: Medicare Other | Admitting: Physical Therapy

## 2018-03-22 ENCOUNTER — Ambulatory Visit: Payer: Medicare Other | Admitting: Physical Therapy

## 2018-03-23 DIAGNOSIS — C678 Malignant neoplasm of overlapping sites of bladder: Secondary | ICD-10-CM | POA: Diagnosis not present

## 2018-03-23 DIAGNOSIS — Z5111 Encounter for antineoplastic chemotherapy: Secondary | ICD-10-CM | POA: Diagnosis not present

## 2018-03-26 ENCOUNTER — Ambulatory Visit: Payer: Medicare Other | Admitting: Physical Therapy

## 2018-03-28 ENCOUNTER — Ambulatory Visit: Payer: Medicare Other | Attending: Neurology | Admitting: Physical Therapy

## 2018-03-30 DIAGNOSIS — Z5111 Encounter for antineoplastic chemotherapy: Secondary | ICD-10-CM | POA: Diagnosis not present

## 2018-03-30 DIAGNOSIS — C678 Malignant neoplasm of overlapping sites of bladder: Secondary | ICD-10-CM | POA: Diagnosis not present

## 2018-04-05 ENCOUNTER — Encounter

## 2018-04-05 ENCOUNTER — Encounter: Payer: Medicare Other | Admitting: Psychology

## 2018-04-08 LAB — CUP PACEART REMOTE DEVICE CHECK
Battery Remaining Longevity: 32 mo
Battery Voltage: 2.94 V
Brady Statistic AP VP Percent: 45.32 %
Brady Statistic AP VS Percent: 0.73 %
Brady Statistic AS VP Percent: 52.5 %
Brady Statistic AS VS Percent: 1.45 %
Brady Statistic RV Percent Paced: 93.73 %
Date Time Interrogation Session: 20191210083522
HighPow Impedance: 71 Ohm
Implantable Lead Implant Date: 20160217
Implantable Lead Implant Date: 20160217
Implantable Lead Location: 753859
Implantable Lead Location: 753860
Implantable Lead Model: 4298
Implantable Lead Model: 5076
Implantable Pulse Generator Implant Date: 20160217
Lead Channel Impedance Value: 342 Ohm
Lead Channel Impedance Value: 399 Ohm
Lead Channel Impedance Value: 399 Ohm
Lead Channel Impedance Value: 399 Ohm
Lead Channel Impedance Value: 399 Ohm
Lead Channel Impedance Value: 418 Ohm
Lead Channel Impedance Value: 456 Ohm
Lead Channel Impedance Value: 456 Ohm
Lead Channel Impedance Value: 551 Ohm
Lead Channel Impedance Value: 551 Ohm
Lead Channel Impedance Value: 646 Ohm
Lead Channel Pacing Threshold Amplitude: 0.625 V
Lead Channel Pacing Threshold Amplitude: 1.125 V
Lead Channel Pacing Threshold Pulse Width: 0.4 ms
Lead Channel Pacing Threshold Pulse Width: 0.4 ms
Lead Channel Pacing Threshold Pulse Width: 0.4 ms
Lead Channel Sensing Intrinsic Amplitude: 20.125 mV
Lead Channel Sensing Intrinsic Amplitude: 20.125 mV
Lead Channel Sensing Intrinsic Amplitude: 3.625 mV
Lead Channel Sensing Intrinsic Amplitude: 3.625 mV
Lead Channel Setting Pacing Amplitude: 2 V
Lead Channel Setting Pacing Amplitude: 2.25 V
Lead Channel Setting Pacing Amplitude: 2.5 V
Lead Channel Setting Pacing Pulse Width: 0.4 ms
Lead Channel Setting Pacing Pulse Width: 0.4 ms
Lead Channel Setting Sensing Sensitivity: 0.3 mV
MDC IDC LEAD IMPLANT DT: 20160217
MDC IDC LEAD LOCATION: 753858
MDC IDC MSMT LEADCHNL LV IMPEDANCE VALUE: 646 Ohm
MDC IDC MSMT LEADCHNL LV IMPEDANCE VALUE: 665 Ohm
MDC IDC MSMT LEADCHNL RA PACING THRESHOLD AMPLITUDE: 0.5 V
MDC IDC STAT BRADY RA PERCENT PACED: 45.6 %

## 2018-05-01 DIAGNOSIS — M5441 Lumbago with sciatica, right side: Secondary | ICD-10-CM | POA: Diagnosis not present

## 2018-05-01 DIAGNOSIS — M160 Bilateral primary osteoarthritis of hip: Secondary | ICD-10-CM | POA: Diagnosis not present

## 2018-05-01 DIAGNOSIS — M8580 Other specified disorders of bone density and structure, unspecified site: Secondary | ICD-10-CM | POA: Diagnosis not present

## 2018-05-01 DIAGNOSIS — M4805 Spinal stenosis, thoracolumbar region: Secondary | ICD-10-CM | POA: Diagnosis not present

## 2018-05-01 DIAGNOSIS — F17200 Nicotine dependence, unspecified, uncomplicated: Secondary | ICD-10-CM | POA: Diagnosis not present

## 2018-05-01 DIAGNOSIS — M47818 Spondylosis without myelopathy or radiculopathy, sacral and sacrococcygeal region: Secondary | ICD-10-CM | POA: Diagnosis not present

## 2018-05-01 DIAGNOSIS — I517 Cardiomegaly: Secondary | ICD-10-CM | POA: Diagnosis not present

## 2018-05-01 DIAGNOSIS — Z95810 Presence of automatic (implantable) cardiac defibrillator: Secondary | ICD-10-CM | POA: Diagnosis not present

## 2018-05-01 DIAGNOSIS — M5416 Radiculopathy, lumbar region: Secondary | ICD-10-CM | POA: Diagnosis not present

## 2018-05-01 DIAGNOSIS — M545 Low back pain: Secondary | ICD-10-CM | POA: Diagnosis not present

## 2018-05-01 DIAGNOSIS — M4316 Spondylolisthesis, lumbar region: Secondary | ICD-10-CM | POA: Diagnosis not present

## 2018-05-01 DIAGNOSIS — M5442 Lumbago with sciatica, left side: Secondary | ICD-10-CM | POA: Diagnosis not present

## 2018-05-01 DIAGNOSIS — M4312 Spondylolisthesis, cervical region: Secondary | ICD-10-CM | POA: Diagnosis not present

## 2018-05-07 ENCOUNTER — Telehealth: Payer: Self-pay

## 2018-05-07 NOTE — Telephone Encounter (Signed)
Copied from [place] [id number]. Topic: Appointment Scheduling - Prior Auth Required for Appointment >> May 07, 2018 11:48 AM Ahmed Prima L wrote: Patient is requesting a bone density and pneumonia vaccine. She also has a record of her blood pressure readings that she wants Dr Derrel Nip to look at. Please advise. >> May 07, 2018 11:50 AM Ahmed Prima L wrote: Routing to correct office, click on wrong one.

## 2018-05-08 ENCOUNTER — Other Ambulatory Visit: Payer: Self-pay

## 2018-05-08 ENCOUNTER — Encounter: Payer: Self-pay | Admitting: Student in an Organized Health Care Education/Training Program

## 2018-05-08 ENCOUNTER — Ambulatory Visit
Payer: Medicare Other | Attending: Student in an Organized Health Care Education/Training Program | Admitting: Student in an Organized Health Care Education/Training Program

## 2018-05-08 VITALS — BP 125/69 | HR 69 | Temp 98.4°F | Resp 18 | Ht 68.0 in | Wt 120.0 lb

## 2018-05-08 DIAGNOSIS — M5136 Other intervertebral disc degeneration, lumbar region: Secondary | ICD-10-CM

## 2018-05-08 DIAGNOSIS — M47816 Spondylosis without myelopathy or radiculopathy, lumbar region: Secondary | ICD-10-CM | POA: Diagnosis not present

## 2018-05-08 DIAGNOSIS — M48062 Spinal stenosis, lumbar region with neurogenic claudication: Secondary | ICD-10-CM | POA: Diagnosis not present

## 2018-05-08 DIAGNOSIS — G894 Chronic pain syndrome: Secondary | ICD-10-CM | POA: Insufficient documentation

## 2018-05-08 DIAGNOSIS — M5416 Radiculopathy, lumbar region: Secondary | ICD-10-CM

## 2018-05-08 MED ORDER — HYDROCODONE-ACETAMINOPHEN 10-325 MG PO TABS: 1.0000 | ORAL_TABLET | ORAL | 0 refills | Status: DC | PRN

## 2018-05-08 NOTE — Telephone Encounter (Signed)
LMTCB. Need to schedule pt a follow up appt with Dr. Derrel Nip. Pt has not been since 03/2017. [facility] may speak with pt.

## 2018-05-08 NOTE — Patient Instructions (Signed)
You were given 3 prescriptions for Hydrocodone today.

## 2018-05-08 NOTE — Progress Notes (Signed)
Patient's Name: [name]  MRN: [id number]  Referring Provider: [name], MD  DOB: [birth year]  PCP: [name], MD  DOS: 05/08/2018  Note by: [name], MD  Service setting: Ambulatory outpatient  Specialty: Interventional Pain Management  Location: [facility] (AMB) Pain Management Facility    Patient type: Established   Primary Reason(s) for Visit: Encounter for prescription drug management. (Level of risk: moderate)  CC: Back Pain (lower)  HPI  [name] is a 78 y.o. year old, female patient, who comes today for a medication management evaluation. She has Palpitations; Hyponatremia; Bladder cancer ([place]); LBBB (left bundle branch block); Atrial ectopy; Congestive dilated cardiomyopathy ([place]); History of gastric ulcer; Spinal stenosis of thoracolumbar region; COPD (chronic obstructive pulmonary disease) ([place]); Constipation; Spinal stenosis in cervical region; Chronic bilateral low back pain; NSAID induced gastritis; Parkinsonian features; Hypertension; Spinal stenosis, lumbar region, with neurogenic claudication; Other idiopathic scoliosis, lumbar region; Hyperlipidemia; S/P placement of cardiac pacemaker; Coronary artery disease; Lumbar spondylosis; SI joint arthritis; Lumbar degenerative disc disease; Chronic pain syndrome; and Lumbar radiculopathy on their problem list. Her primarily concern today is the Back Pain (lower)  Pain Assessment: Location: Lower Back Radiating: to left hip and buttocks, backs of both legs to knees Onset: More than a month ago Duration: Chronic pain Quality: Aching, Burning, Constant Severity: 5 /10 (subjective, self-reported pain score)  Note: Reported level is compatible with observation.                         When using our objective Pain Scale, levels between 6 and 10/10 are said to belong in an emergency room, as it progressively worsens from a 6/10, described as severely limiting, requiring emergency care not usually available at an outpatient pain  management facility. At a 6/10 level, communication becomes difficult and requires great effort. Assistance to reach the emergency department may be required. Facial flushing and profuse sweating along with potentially dangerous increases in heart rate and blood pressure will be evident. Effect on ADL: limits daily activities Timing: Constant Modifying factors: meds BP: 125/69  HR: 69  [name] was last scheduled for an appointment on 02/06/2018 for medication management. During today's appointment we reviewed [name] chronic pain status, as well as her outpatient medication regimen.  The patient  reports no history of drug use. Her body mass index is 18.25 kg/m.  Further details on both, my assessment(s), as well as the proposed treatment plan, please see below.  Controlled Substance Pharmacotherapy Assessment REMS (Risk Evaluation and Mitigation Strategy)   05/02/2018  2   03/03/2018  Hydrocodone-Acetamin 10-325 MG  180.00 30 Bi Lat   [id number]   [place] (7587)   0  60.00 MME  Comm Ins   [place]    [name], RN  05/08/2018  1:40 PM  Signed Nursing Pain Medication Assessment:  Safety precautions to be maintained throughout the outpatient stay will include: orient to surroundings, keep bed in low position, maintain call bell within reach at all times, provide assistance with transfer out of bed and ambulation.  Medication Inspection Compliance: Pill count conducted under aseptic conditions, in front of the patient. Neither the pills nor the bottle was removed from the patient's sight at any time. Once count was completed pills were immediately returned to the patient in their original bottle.  Medication: Hydrocodone/APAP Pill/Patch Count: 156 of 180 pills remain Pill/Patch Appearance: Markings consistent with prescribed medication Bottle Appearance: Standard pharmacy container. Clearly labeled. Filled  Date: 2 / 32 / 2020 Last Medication intake:  Today   Pharmacokinetics: Liberation and  absorption (onset of action): WNL Distribution (time to peak effect): WNL Metabolism and excretion (duration of action): WNL         Pharmacodynamics: Desired effects: Analgesia: [name] reports >50% benefit. Functional ability: Patient reports that medication allows her to accomplish basic ADLs Clinically meaningful improvement in function (CMIF): Sustained CMIF goals met Perceived effectiveness: Described as relatively effective, allowing for increase in activities of daily living (ADL) Undesirable effects: Side-effects or Adverse reactions: None reported Monitoring: [place] PMP: Online review of the past 47-monthperiod conducted. Compliant with practice rules and regulations Last UDS on record: Summary  Date Value Ref Range Status  02/06/2018 FINAL  Final    Comment:    ==================================================================== TOXASSURE SELECT 13 (MW) ==================================================================== Test                             Result       Flag       Units Drug Present   Hydrocodone                    3616                    ng/mg creat   Hydromorphone                  1604                    ng/mg creat   Dihydrocodeine                 328                     ng/mg creat   Norhydrocodone                 5344                    ng/mg creat    Sources of hydrocodone include scheduled prescription    medications. Hydromorphone, dihydrocodeine and norhydrocodone are    expected metabolites of hydrocodone. Hydromorphone and    dihydrocodeine are also available as scheduled prescription    medications. ==================================================================== Test                      Result    Flag   Units      Ref Range   Creatinine              25               mg/dL      >=20 ==================================================================== Declared Medications:  Medication list was not  provided. ==================================================================== For clinical consultation, please call ([phone] ====================================================================    UDS interpretation: Compliant          Medication Assessment Form: Reviewed. Patient indicates being compliant with therapy Treatment compliance: Compliant Risk Assessment Profile: Aberrant behavior: See initial evaluations. None observed or detected today Comorbid factors increasing risk of overdose: See initial evaluation. No additional risks detected today Opioid risk tool (ORT):  Opioid Risk  05/08/2018  Alcohol 0  Illegal Drugs 0  Rx Drugs 0  Alcohol 0  Illegal Drugs 0  Rx Drugs 0  Age between 16-45 years  0  History of Preadolescent Sexual Abuse 0  Psychological Disease 0  Depression  0  Opioid Risk Tool Scoring 0  Opioid Risk Interpretation Low Risk    ORT Scoring interpretation table:  Score <3 = Low Risk for SUD  Score between 4-7 = Moderate Risk for SUD  Score >8 = High Risk for Opioid Abuse   Risk of substance use disorder (SUD): Low  Risk Mitigation Strategies:  Patient Counseling: Covered Patient-Prescriber Agreement (PPA): Present and active  Notification to other healthcare providers: Done  Pharmacologic Plan: No change in therapy, at this time.             Renal Function Markers Lab Results  Component Value Date   BUN 11 03/24/2017   CREATININE 0.73 03/24/2017   BCR 18 04/28/2014   GFRAA >60 10/02/2015   GFRNONAA >60 10/02/2015                             Hepatic Function Markers Lab Results  Component Value Date   AST 25 03/24/2017   ALT 17 03/24/2017   ALBUMIN 4.4 03/24/2017   ALKPHOS 105 03/24/2017   LIPASE 30 11/23/2013                        Electrolytes Lab Results  Component Value Date   NA 136 06/30/2017   K 4.2 06/30/2017   CL 99 03/24/2017   CALCIUM 9.9 03/24/2017                        Neuropathy Markers Lab Results   Component Value Date   VITAMINB12 1,069 (H) 12/27/2013   HGBA1C 4.6 12/27/2013                          Coagulation Parameters Lab Results  Component Value Date   INR 1.0 04/28/2014   LABPROT 10.3 04/28/2014   APTT 27 11/22/2013   PLT 209 10/02/2015                        Cardiovascular Markers Lab Results  Component Value Date   CKTOTAL 87 09/01/2010   CKMB 4.1 (H) 09/01/2010   TROPONINI <0.30 11/23/2013   HGB 13.3 06/30/2017   HCT 39.0 06/30/2017                         CA Markers No results found for: CEA, CA125, LABCA2                      Endocrine Markers Lab Results  Component Value Date   TSH 0.67 10/10/2016   FREET4 1.48 11/27/2013                        Note: Lab results reviewed.  Recent Diagnostic Imaging Results  CUP PACEART REMOTE DEVICE CHECK Normal remote reviewed.  Next remote 3.10.2020.  Complexity Note: Imaging results reviewed. Results shared with Ms. Schey, using Layman's terms.                         Meds   Current Outpatient Medications:  .  amLODipine (NORVASC) 2.5 MG tablet, Take 2.5 mg by mouth daily., Disp: , Rfl:  .  [START ON 05/31/2018] HYDROcodone-acetaminophen (NORCO) 10-325 MG tablet, Take 1 tablet by mouth every 4 (four) hours as needed. To fill on  or after: 05/31/2018, 06/30/2026, 07/29/2018, Disp: 180 tablet, Rfl: 0 .  lisinopril (PRINIVIL,ZESTRIL) 40 MG tablet, TAKE 1 TABLET(40 MG) BY MOUTH DAILY, Disp: 90 tablet, Rfl: 0 .  metoprolol succinate (TOPROL-XL) 25 MG 24 hr tablet, Take 1 tablet (25 mg total) by mouth daily., Disp: 90 tablet, Rfl: 3 .  pantoprazole (PROTONIX) 40 MG tablet, TAKE 1 TABLET(40 MG) BY MOUTH TWICE DAILY, Disp: 180 tablet, Rfl: 1 .  polyethylene glycol (MIRALAX / GLYCOLAX) packet, Take 17 g by mouth daily as needed for mild constipation. , Disp: , Rfl:  .  sucralfate (CARAFATE) 1 g tablet, TAKE 1 TABLET BY MOUTH FOUR TIMES DAILY EVERY NIGHT AT BEDTIME WITH MEALS, Disp: 120 tablet, Rfl: 0 .  Vitamin D,  Ergocalciferol, (DRISDOL) 50000 units CAPS capsule, TK ONE C PO Q WEEK FOR 8 WEEKS, Disp: , Rfl: 0 No current facility-administered medications for this visit.   Facility-Administered Medications Ordered in Other Visits:  .  epirubicin (ELLENCE) 50 mg in sodium chloride 0.9 % bladder instillation, 50 mg, Bladder Instillation, Once, [name], MD  ROS  Constitutional: Denies any fever or chills Gastrointestinal: No reported hemesis, hematochezia, vomiting, or acute GI distress Musculoskeletal: Denies any acute onset joint swelling, redness, loss of ROM, or weakness Neurological: No reported episodes of acute onset apraxia, aphasia, dysarthria, agnosia, amnesia, paralysis, loss of coordination, or loss of consciousness  Allergies  Ms. Hain has No Known Allergies.  [place]  Drug: Ms. Stotts  reports no history of drug use. Alcohol:  reports no history of alcohol use. Tobacco:  reports that she has been smoking cigarettes. She has a 27.50 pack-year smoking history. She has never used smokeless tobacco. Medical:  has a past medical history of AICD (automatic cardioverter/defibrillator) present (placement 05/07/2014 medtronic    ), Arthritis, Bladder cancer ([place]) (urologist-  dr Junious Silk), Chronic hyponatremia, Chronic lower back pain, Chronic systolic (congestive) heart failure ([place]), COPD with emphysema ([place]), Coronary artery disease (cardiologist-  dr Kathlyn [place]), DDD (degenerative disc disease), thoracolumbar, Ectopic atrial beats, Frequent urination, Full dentures, Gait instability, GERD (gastroesophageal reflux disease), History of iron deficiency anemia (11/2013), History of stomach ulcers (11/2013), Hypertension, LBBB (left bundle branch block), NICM (nonischemic cardiomyopathy) ([place]) (last echo 12-31-2015 ef 55-60%), Nocturia more than twice per night, and Scoliosis. Surgical: Ms. [place]  has a past surgical history that includes Esophagogastroduodenoscopy (N/A, 11/28/2013); Transurethral  resection of bladder tumor (N/A, 12/13/2013); Transurethral resection of bladder tumor (N/A, 01/17/2014); Cataract extraction w/ intraocular lens  implant, bilateral (Bilateral, right 12-2013 / left  02-2014); Cystoscopy with retrograde pyelogram, ureteroscopy and stent placement (Bilateral, 04/18/2014); Transurethral resection of bladder tumor (N/A, 04/18/2014); Lumbar disc surgery (1980's); bi-ventricular implantable cardioverter defibrillator (N/A, 05/07/2014); Cystoscopy w/ retrogrades (Bilateral, 01/06/2015); Cystoscopy with biopsy (N/A, 10/06/2015); Cardiac catheterization (03-03-2014  dr Kathlyn [place]   [facility]); Transurethral resection of bladder (1995); Cardiovascular stress test (11/25/2013); transthoracic echocardiogram (12/31/2015   dr Caryl Comes); and Cystoscopy with fulgeration (N/A, 06/30/2017). Family: family history includes Cancer in her brother and mother.  Constitutional Exam  General appearance: Well nourished, well developed, and well hydrated. In no apparent acute distress Vitals:   05/08/18 1328  BP: 125/69  Pulse: 69  Resp: 18  Temp: 98.4 F (36.9 C)  TempSrc: Oral  SpO2: 100%  Weight: 120 lb (54.4 kg)  Height: _0  (1.727 m)   BMI Assessment: Estimated body mass index is 18.25 kg/m as calculated from the following:   Height as of this encounter: _1  (1.727 m).   Weight  as of this encounter: 120 lb (54.4 kg).  BMI interpretation table: BMI level Category Range association with higher incidence of chronic pain  <18 kg/m2 Underweight   18.5-24.9 kg/m2 Ideal body weight   25-29.9 kg/m2 Overweight Increased incidence by 20%  30-34.9 kg/m2 Obese (Class I) Increased incidence by 68%  35-39.9 kg/m2 Severe obesity (Class II) Increased incidence by 136%  >40 kg/m2 Extreme obesity (Class III) Increased incidence by 254%   Patient's current BMI Ideal Body weight  Body mass index is 18.25 kg/m. Ideal body weight: 63.9 kg (140 lb 14 oz)   BMI Readings from Last 4 Encounters:   05/08/18 18.25 kg/m  02/06/18 18.25 kg/m  12/12/17 19.01 kg/m  11/14/17 19.01 kg/m   Wt Readings from Last 4 Encounters:  05/08/18 120 lb (54.4 kg)  02/06/18 120 lb (54.4 kg)  12/12/17 125 lb (56.7 kg)  11/14/17 125 lb (56.7 kg)  Psych/Mental status: Alert, oriented x 3 (person, place, & time)       Eyes: PERLA Respiratory: No evidence of acute respiratory distress  Cervical Spine Area Exam  Skin & Axial Inspection: No masses, redness, edema, swelling, or associated skin lesions Alignment: Symmetrical Functional ROM: Unrestricted ROM      Stability: No instability detected Muscle Tone/Strength: Functionally intact. No obvious neuro-muscular anomalies detected. Sensory (Neurological): Unimpaired Palpation: No palpable anomalies              Upper Extremity (UE) Exam    Side: Right upper extremity  Side: Left upper extremity  Skin & Extremity Inspection: Skin color, temperature, and hair growth are WNL. No peripheral edema or cyanosis. No masses, redness, swelling, asymmetry, or associated skin lesions. No contractures.  Skin & Extremity Inspection: Skin color, temperature, and hair growth are WNL. No peripheral edema or cyanosis. No masses, redness, swelling, asymmetry, or associated skin lesions. No contractures.  Functional ROM: Unrestricted ROM          Functional ROM: Unrestricted ROM          Muscle Tone/Strength: Functionally intact. No obvious neuro-muscular anomalies detected.  Muscle Tone/Strength: Functionally intact. No obvious neuro-muscular anomalies detected.  Sensory (Neurological): Unimpaired          Sensory (Neurological): Unimpaired          Palpation: No palpable anomalies              Palpation: No palpable anomalies              Provocative Test(s):  Phalen's test: deferred Tinel's test: deferred Apley's scratch test (touch opposite shoulder):  Action 1 (Across chest): deferred Action 2 (Overhead): deferred Action 3 (LB reach): deferred   Provocative  Test(s):  Phalen's test: deferred Tinel's test: deferred Apley's scratch test (touch opposite shoulder):  Action 1 (Across chest): deferred Action 2 (Overhead): deferred Action 3 (LB reach): deferred    Thoracic Spine Area Exam  Skin & Axial Inspection: No masses, redness, or swelling Alignment: Asymmetric Functional ROM: Decreased ROM Stability: No instability detected Muscle Tone/Strength: Functionally intact. No obvious neuro-muscular anomalies detected. Sensory (Neurological): Musculoskeletal pain pattern Muscle strength & Tone: No palpable anomalies  Lumbar Spine Area Exam  Skin & Axial Inspection: No masses, redness, or swelling Alignment: Levoscoliosis Functional ROM: Decreased ROM       Stability: No instability detected Muscle Tone/Strength: Increased muscle tone over affected area Sensory (Neurological): Dermatomal pain pattern Palpation: No palpable anomalies       Provocative Tests: Hyperextension/rotation test: deferred today  Lumbar quadrant test (Kemp's test): deferred today       Lateral bending test: (+) ipsilateral radicular pain, bilaterally. Positive for bilateral foraminal stenosis. Patrick's Maneuver: deferred today                   FABER test: deferred today                   S-I anterior distraction/compression test: deferred today         S-I lateral compression test: deferred today         S-I Thigh-thrust test: deferred today         S-I Gaenslen's test: deferred today          Gait & Posture Assessment  Ambulation: Patient ambulates using a walker Gait: Limited. Using assistive device to ambulate Posture: Difficulty standing up straight, due to pain   Lower Extremity Exam    Side: Right lower extremity  Side: Left lower extremity  Stability: No instability observed          Stability: No instability observed          Skin & Extremity Inspection: Skin color, temperature, and hair growth are WNL. No peripheral edema or cyanosis. No  masses, redness, swelling, asymmetry, or associated skin lesions. No contractures.  Skin & Extremity Inspection: Skin color, temperature, and hair growth are WNL. No peripheral edema or cyanosis. No masses, redness, swelling, asymmetry, or associated skin lesions. No contractures.  Functional ROM: Unrestricted ROM                  Functional ROM: Unrestricted ROM                  Muscle Tone/Strength: Functionally intact. No obvious neuro-muscular anomalies detected.  Muscle Tone/Strength: Functionally intact. No obvious neuro-muscular anomalies detected.  Sensory (Neurological): Dermatomal pain pattern top of foot (L5)  Sensory (Neurological): Musculoskeletal pain pattern   DTR: Patellar: deferred today Achilles: deferred today Plantar: deferred today  DTR: Patellar: deferred today Achilles: deferred today Plantar: deferred today  Palpation: No palpable anomalies  Palpation: No palpable anomalies     Assessment   Status Diagnosis  Controlled Persistent Persistent 1. Chronic pain syndrome   2. Lumbar radiculopathy   3. Lumbar degenerative disc disease   4. Spinal stenosis, lumbar region, with neurogenic claudication   5. Lumbar facet arthropathy      Very pleasant 78 year old female with a history of lumbar radiculopathy, lumbar spinal stenosis with neurogenic claudication, history of prior lumbar laminectomy in the 1980s with worsening axial low back pain as well as radicular pain.  Patient did have a CT myelogram at [place] at the end of December which shows hemilaminectomy on left L5 along with high-grade spinal stenosis related to facet arthropathy at L3, L4 and L4-L5.  Patient also has severe spinal stenosis at L1-L2.  Patient has tried lumbar epidural steroid injections as well as lumbar facet medial branch nerve blocks which were not effective for axial low back pain.  Performed patient has been evaluated by [facility] neurosurgery.  She is planning to have a spinal cord stimulator  trial with Dr. Mearl Latin at [facility] and if successful we will proceed with permanent implant.  If SCS trial is not successful, patient may consider lumbar decompressive spine surgery.  Patient does have severe scoliosis and wants to avoid lumbar decompressive spine surgery at all cost.  We will refill patient's medications as below and patient will follow-up with me in 4  months.  Plan of Care  Pharmacotherapy (Medications Ordered): Meds ordered this encounter  Medications  . DISCONTD: HYDROcodone-acetaminophen (NORCO) 10-325 MG tablet    Sig: Take 1 tablet by mouth every 4 (four) hours as needed. To fill on or after: 05/31/2018, 06/30/2026, 07/29/2018    Dispense:  180 tablet    Refill:  0  . DISCONTD: HYDROcodone-acetaminophen (NORCO) 10-325 MG tablet    Sig: Take 1 tablet by mouth every 4 (four) hours as needed. To fill on or after: 05/31/2018, 06/30/2026, 07/29/2018    Dispense:  180 tablet    Refill:  0  . HYDROcodone-acetaminophen (NORCO) 10-325 MG tablet    Sig: Take 1 tablet by mouth every 4 (four) hours as needed. To fill on or after: 05/31/2018, 06/30/2026, 07/29/2018    Dispense:  180 tablet    Refill:  0   Provider-requested follow-up: Return in about 4 months (around 08/27/2018) for Medication Management.  Future Appointments  Date Time Provider [place]  05/29/2018  8:30 AM [facility] DEVICE REMOTES [facility]  06/13/2018  3:00 PM O'Brien-Blaney, Denisa L, LPN [facility]  10/29/313  1:00 PM Edgardo Roys, PsyD [facility]  08/16/2018  2:00 PM [name], MD [facility] None    Primary Care Physician: [name], MD Location: [facility] Note by: [name], M.D Date: 05/08/2018; Time: 3:24 PM  Patient Instructions  You were given 3 prescriptions for Hydrocodone today.

## 2018-05-08 NOTE — Progress Notes (Signed)
Nursing Pain Medication Assessment:  Safety precautions to be maintained throughout the outpatient stay will include: orient to surroundings, keep bed in low position, maintain call bell within reach at all times, provide assistance with transfer out of bed and ambulation.  Medication Inspection Compliance: Pill count conducted under aseptic conditions, in front of the patient. Neither the pills nor the bottle was removed from the patient's sight at any time. Once count was completed pills were immediately returned to the patient in their original bottle.  Medication: Hydrocodone/APAP Pill/Patch Count: 156 of 180 pills remain Pill/Patch Appearance: Markings consistent with prescribed medication Bottle Appearance: Standard pharmacy container. Clearly labeled. Filled Date: 2 / 62 / 2020 Last Medication intake:  Today

## 2018-05-10 DIAGNOSIS — R2689 Other abnormalities of gait and mobility: Secondary | ICD-10-CM | POA: Diagnosis not present

## 2018-05-10 DIAGNOSIS — Z8739 Personal history of other diseases of the musculoskeletal system and connective tissue: Secondary | ICD-10-CM | POA: Diagnosis not present

## 2018-05-14 NOTE — Telephone Encounter (Signed)
Pt has been scheduled for an appt on 06/07/2018.

## 2018-05-17 DIAGNOSIS — G8929 Other chronic pain: Secondary | ICD-10-CM | POA: Diagnosis not present

## 2018-05-17 DIAGNOSIS — M5442 Lumbago with sciatica, left side: Secondary | ICD-10-CM | POA: Diagnosis not present

## 2018-05-17 DIAGNOSIS — M5441 Lumbago with sciatica, right side: Secondary | ICD-10-CM | POA: Diagnosis not present

## 2018-05-23 DIAGNOSIS — Z8551 Personal history of malignant neoplasm of bladder: Secondary | ICD-10-CM | POA: Diagnosis not present

## 2018-05-29 ENCOUNTER — Ambulatory Visit (INDEPENDENT_AMBULATORY_CARE_PROVIDER_SITE_OTHER): Payer: Medicare Other | Admitting: *Deleted

## 2018-05-29 DIAGNOSIS — I428 Other cardiomyopathies: Secondary | ICD-10-CM

## 2018-05-30 DIAGNOSIS — M5442 Lumbago with sciatica, left side: Secondary | ICD-10-CM | POA: Diagnosis not present

## 2018-05-30 DIAGNOSIS — I491 Atrial premature depolarization: Secondary | ICD-10-CM | POA: Diagnosis not present

## 2018-05-30 DIAGNOSIS — J449 Chronic obstructive pulmonary disease, unspecified: Secondary | ICD-10-CM | POA: Diagnosis not present

## 2018-05-30 DIAGNOSIS — R58 Hemorrhage, not elsewhere classified: Secondary | ICD-10-CM | POA: Diagnosis not present

## 2018-05-30 DIAGNOSIS — G894 Chronic pain syndrome: Secondary | ICD-10-CM | POA: Diagnosis not present

## 2018-05-30 DIAGNOSIS — F4323 Adjustment disorder with mixed anxiety and depressed mood: Secondary | ICD-10-CM | POA: Diagnosis not present

## 2018-05-30 DIAGNOSIS — Z01818 Encounter for other preprocedural examination: Secondary | ICD-10-CM | POA: Diagnosis not present

## 2018-05-30 DIAGNOSIS — C679 Malignant neoplasm of bladder, unspecified: Secondary | ICD-10-CM | POA: Diagnosis not present

## 2018-05-30 DIAGNOSIS — M4805 Spinal stenosis, thoracolumbar region: Secondary | ICD-10-CM | POA: Diagnosis not present

## 2018-05-30 DIAGNOSIS — I420 Dilated cardiomyopathy: Secondary | ICD-10-CM | POA: Diagnosis not present

## 2018-05-30 DIAGNOSIS — I10 Essential (primary) hypertension: Secondary | ICD-10-CM | POA: Diagnosis not present

## 2018-05-30 DIAGNOSIS — Z8711 Personal history of peptic ulcer disease: Secondary | ICD-10-CM | POA: Diagnosis not present

## 2018-05-30 DIAGNOSIS — F17200 Nicotine dependence, unspecified, uncomplicated: Secondary | ICD-10-CM | POA: Diagnosis not present

## 2018-05-30 DIAGNOSIS — Z950 Presence of cardiac pacemaker: Secondary | ICD-10-CM | POA: Diagnosis not present

## 2018-05-30 DIAGNOSIS — M5441 Lumbago with sciatica, right side: Secondary | ICD-10-CM | POA: Diagnosis not present

## 2018-05-30 LAB — CUP PACEART REMOTE DEVICE CHECK
Battery Remaining Longevity: 28 mo
Battery Voltage: 2.93 V
Brady Statistic AP VP Percent: 51.05 %
Brady Statistic AP VS Percent: 0.82 %
Brady Statistic AS VP Percent: 46.48 %
Brady Statistic AS VS Percent: 1.65 %
Brady Statistic RA Percent Paced: 50.87 %
Brady Statistic RV Percent Paced: 91.64 %
HIGH POWER IMPEDANCE MEASURED VALUE: 71 Ohm
Implantable Lead Implant Date: 20160217
Implantable Lead Implant Date: 20160217
Implantable Lead Implant Date: 20160217
Implantable Lead Location: 753858
Implantable Lead Location: 753860
Implantable Lead Model: 4298
Implantable Lead Model: 5076
Implantable Pulse Generator Implant Date: 20160217
Lead Channel Impedance Value: 342 Ohm
Lead Channel Impedance Value: 361 Ohm
Lead Channel Impedance Value: 361 Ohm
Lead Channel Impedance Value: 361 Ohm
Lead Channel Impedance Value: 418 Ohm
Lead Channel Impedance Value: 475 Ohm
Lead Channel Impedance Value: 589 Ohm
Lead Channel Impedance Value: 608 Ohm
Lead Channel Impedance Value: 646 Ohm
Lead Channel Impedance Value: 646 Ohm
Lead Channel Impedance Value: 665 Ohm
Lead Channel Pacing Threshold Amplitude: 0.625 V
Lead Channel Pacing Threshold Amplitude: 0.75 V
Lead Channel Pacing Threshold Amplitude: 1 V
Lead Channel Pacing Threshold Pulse Width: 0.4 ms
Lead Channel Pacing Threshold Pulse Width: 0.4 ms
Lead Channel Pacing Threshold Pulse Width: 0.4 ms
Lead Channel Sensing Intrinsic Amplitude: 18.875 mV
Lead Channel Sensing Intrinsic Amplitude: 18.875 mV
Lead Channel Sensing Intrinsic Amplitude: 2.25 mV
Lead Channel Setting Pacing Amplitude: 2 V
Lead Channel Setting Pacing Amplitude: 2 V
Lead Channel Setting Pacing Pulse Width: 0.4 ms
Lead Channel Setting Pacing Pulse Width: 0.4 ms
Lead Channel Setting Sensing Sensitivity: 0.3 mV
MDC IDC LEAD LOCATION: 753859
MDC IDC MSMT LEADCHNL LV IMPEDANCE VALUE: 399 Ohm
MDC IDC MSMT LEADCHNL LV IMPEDANCE VALUE: 418 Ohm
MDC IDC MSMT LEADCHNL RA SENSING INTR AMPL: 2.25 mV
MDC IDC SESS DTM: 20200310083824
MDC IDC SET LEADCHNL RV PACING AMPLITUDE: 2.5 V

## 2018-06-05 ENCOUNTER — Encounter: Payer: Self-pay | Admitting: Cardiology

## 2018-06-05 NOTE — Progress Notes (Signed)
Remote ICD transmission.   

## 2018-06-07 ENCOUNTER — Telehealth: Payer: Self-pay

## 2018-06-07 ENCOUNTER — Ambulatory Visit (INDEPENDENT_AMBULATORY_CARE_PROVIDER_SITE_OTHER): Payer: Medicare Other | Admitting: Internal Medicine

## 2018-06-07 ENCOUNTER — Other Ambulatory Visit: Payer: Self-pay

## 2018-06-07 ENCOUNTER — Encounter: Payer: Self-pay | Admitting: Internal Medicine

## 2018-06-07 VITALS — BP 142/92 | HR 73 | Temp 98.1°F | Resp 15 | Ht 68.0 in | Wt 116.4 lb

## 2018-06-07 DIAGNOSIS — I10 Essential (primary) hypertension: Secondary | ICD-10-CM | POA: Diagnosis not present

## 2018-06-07 DIAGNOSIS — Z780 Asymptomatic menopausal state: Secondary | ICD-10-CM

## 2018-06-07 DIAGNOSIS — E871 Hypo-osmolality and hyponatremia: Secondary | ICD-10-CM | POA: Diagnosis not present

## 2018-06-07 DIAGNOSIS — Z23 Encounter for immunization: Secondary | ICD-10-CM

## 2018-06-07 DIAGNOSIS — M8580 Other specified disorders of bone density and structure, unspecified site: Secondary | ICD-10-CM | POA: Diagnosis not present

## 2018-06-07 DIAGNOSIS — R634 Abnormal weight loss: Secondary | ICD-10-CM | POA: Diagnosis not present

## 2018-06-07 DIAGNOSIS — R110 Nausea: Secondary | ICD-10-CM

## 2018-06-07 MED ORDER — LOSARTAN POTASSIUM 100 MG PO TABS: 100.0000 mg | ORAL_TABLET | Freq: Every evening | ORAL | 3 refills | Status: DC | PRN

## 2018-06-07 MED ORDER — ZOSTER VAC RECOMB ADJUVANTED 50 MCG/0.5ML IM SUSR: 0.5000 mL | Freq: Once | INTRAMUSCULAR | 1 refills | Status: AC

## 2018-06-07 MED ORDER — PROMETHAZINE HCL 12.5 MG PO TABS: 12.5000 mg | ORAL_TABLET | Freq: Three times a day (TID) | ORAL | 2 refills | Status: DC | PRN

## 2018-06-07 MED ORDER — TETANUS-DIPHTH-ACELL PERTUSSIS 5-2.5-18.5 LF-MCG/0.5 IM SUSP: 0.5000 mL | Freq: Once | INTRAMUSCULAR | 0 refills | Status: AC

## 2018-06-07 MED ORDER — GABAPENTIN 300 MG PO CAPS: ORAL_CAPSULE | ORAL | 5 refills | Status: DC

## 2018-06-07 NOTE — Patient Instructions (Addendum)
   I am making a decision to change your lisinopril to LOSARTAN, based on increased reports by one of my ENT colleagues of patients  developing tongue and throat swelling from lisinopril.  The condition , called "angioedema," can be fatal if a person's airway is compromised.  I also want you to take it at night instead of morning,  s recent studies have shown that taking your blood pressure medications at night protects you better from heart attacks and strokes.      We will get you set up for a bone density test at [facility] here in [place]  At the facility on [facility] received the Pneumonia vaccine today   .  You may feel achey and  Have A  low grade fever for a day.   I strongly recommend that you get the TDaP vaccine at [facility] .  It is good for ten years for protection against tetanus,  And for lifetime protection against  diptheria and whooping cough     The ShingRx vaccine is now available in local pharmacies and is much more protective than the old one  Zostavax  (it is about 97%  Effective in preventing shingles). .   It is therefore ADVISED for all interested adults over 50 to prevent shingles so I have printed you a prescription for it.  (it requires a 2nd dose 2 too 6 months after the first one) .  It will cause you to have flu  like symptoms for 2 days

## 2018-06-07 NOTE — Telephone Encounter (Signed)
Copied from [place] [id number]. Topic: General - Inquiry >> Jun 07, 2018 12:20 PM Vernona Rieger wrote: Reason for CRM: patient said she just left office and forgot to get the bag of ensure Dr Derrel Nip was going to give her. She is on the way back.

## 2018-06-07 NOTE — Progress Notes (Signed)
Subjective:  Patient ID: [name], female    DOB: [birth year]  Age: 78 y.o. MRN: [id number]  CC: The primary encounter diagnosis was Osteopenia, unspecified location. Diagnoses of Postmenopausal estrogen deficiency, Need for 23-polyvalent pneumococcal polysaccharide vaccine, Essential hypertension, Hyponatremia, Unintentional weight loss, and Nausea were also pertinent to this visit.  HPI [name] presents for evaluation for osteoporosis and follow up on hypertension managed with amlodipine, lisinopril and metoprolol  Overdue for DEXA scan  Weight loss discussed,  She has been having chronic intermittent anorexia and nausea  despite using protonix twice daily .  Uses phenergan prn very rarely. History of ulcer in 2015   Has questions/concerns about  medicare and [facility]  Who wil not pay for name brand drugs  Hyponatremia noted at [facility]     HTN: Patient is taking her medications as prescribed and notes no adverse effects.  Home BP readings have been done about once per week and are  All >  130/80 and often > 150/80.  She is avoiding added salt in her diet but not  walking regularly or participating in other forms of  exercise due to chronic back pain.  .     Outpatient Medications Prior to Visit  Medication Sig Dispense Refill  . amLODipine (NORVASC) 2.5 MG tablet Take 2.5 mg by mouth daily.    Marland Kitchen HYDROcodone-acetaminophen (NORCO) 10-325 MG tablet Take 1 tablet by mouth every 4 (four) hours as needed. To fill on or after: 05/31/2018, 06/30/2026, 07/29/2018 180 tablet 0  . metoprolol succinate (TOPROL-XL) 25 MG 24 hr tablet Take 1 tablet (25 mg total) by mouth daily. 90 tablet 3  . pantoprazole (PROTONIX) 40 MG tablet TAKE 1 TABLET(40 MG) BY MOUTH TWICE DAILY 180 tablet 1  . polyethylene glycol (MIRALAX / GLYCOLAX) packet Take 17 g by mouth daily as needed for mild constipation.     . sucralfate (CARAFATE) 1 g tablet TAKE 1 TABLET BY MOUTH FOUR TIMES DAILY EVERY NIGHT AT BEDTIME WITH MEALS  120 tablet 0  . Vitamin D, Ergocalciferol, (DRISDOL) 50000 units CAPS capsule TK ONE C PO Q WEEK FOR 8 WEEKS  0  . gabapentin (NEURONTIN) 300 MG capsule nightly    . lisinopril (PRINIVIL,ZESTRIL) 40 MG tablet TAKE 1 TABLET(40 MG) BY MOUTH DAILY 90 tablet 0   Facility-Administered Medications Prior to Visit  Medication Dose Route Frequency Provider Last Rate Last Dose  . epirubicin (ELLENCE) 50 mg in sodium chloride 0.9 % bladder instillation  50 mg Bladder Instillation Once [name], MD        Review of Systems;  Patient denies headache, fevers, malaise, unintentional weight loss, skin rash, eye pain, sinus congestion and sinus pain, sore throat, dysphagia,  hemoptysis , cough, dyspnea, wheezing, chest pain, palpitations, orthopnea, edema, abdominal pain, nausea, melena, diarrhea, constipation, flank pain, dysuria, hematuria, urinary  Frequency, nocturia, numbness, tingling, seizures,  Focal weakness, Loss of consciousness,  Tremor, insomnia, depression, anxiety, and suicidal ideation.      Objective:  BP (!) 142/92 (BP Location: Left Arm, Patient Position: Sitting, Cuff Size: Normal)   Pulse 73   Temp 98.1 F (36.7 C) (Oral)   Resp 15   Ht 5\' 8"  (1.727 m)   Wt 116 lb 6.4 oz (52.8 kg)   SpO2 99%   BMI 17.70 kg/m   BP Readings from Last 3 Encounters:  06/07/18 (!) 142/92  05/08/18 125/69  02/06/18 (!) 145/89    Wt Readings from Last 3 Encounters:  06/07/18  116 lb 6.4 oz (52.8 kg)  05/08/18 120 lb (54.4 kg)  02/06/18 120 lb (54.4 kg)    General appearance: alert, cooperative and appears stated age Ears: normal TM's and external ear canals both ears Throat: lips, mucosa, and tongue normal; teeth and gums normal Neck: no adenopathy, no carotid bruit, supple, symmetrical, trachea midline and thyroid not enlarged, symmetric, no tenderness/mass/nodules Back: symmetric, no curvature. ROM normal. No CVA tenderness. Lungs: clear to auscultation bilaterally Heart: regular  rate and rhythm, S1, S2 normal, no murmur, click, rub or gallop Abdomen: soft, non-tender; bowel sounds normal; no masses,  no organomegaly Pulses: 2+ and symmetric Skin: Skin color, texture, turgor normal. No rashes or lesions Lymph nodes: Cervical, supraclavicular, and axillary nodes normal.  Lab Results  Component Value Date   HGBA1C 4.6 12/27/2013   HGBA1C 5.7 (H) 08/31/2010    Lab Results  Component Value Date   CREATININE 0.73 03/24/2017   CREATININE 0.69 11/02/2016   CREATININE 0.67 10/04/2016    Lab Results  Component Value Date   WBC 7.9 10/02/2015   HGB 13.3 06/30/2017   HCT 39.0 06/30/2017   PLT 209 10/02/2015   GLUCOSE 97 06/30/2017   CHOL 188 03/24/2017   TRIG 156.0 (H) 03/24/2017   HDL 44.40 03/24/2017   LDLCALC 113 (H) 03/24/2017   ALT 17 03/24/2017   AST 25 03/24/2017   NA 136 06/30/2017   K 4.2 06/30/2017   CL 99 03/24/2017   CREATININE 0.73 03/24/2017   BUN 11 03/24/2017   CO2 29 03/24/2017   TSH 0.67 10/10/2016   INR 1.0 04/28/2014   HGBA1C 4.6 12/27/2013    Dg C-arm 1-60 Min-no Report  Result Date: 10/09/2017 Fluoroscopy was utilized by the requesting physician.  No radiographic interpretation.    Assessment & Plan:   Problem List Items Addressed This Visit    Unintentional weight loss    Secondary to anorexia from chronic nausea despite twice daily use of a  PPI.  She has a histroy of an NSAID unduced ulcer remotely and has not been using them.    Phenergan refilled. GI referral made       Relevant Orders   Ambulatory referral to Gastroenterology   Hyponatremia    INTERMITTENT and mild (133) ,  Noted again at [facility] . Marland Kitchen  Likely due to COPD and decreased oral intake.       Hypertension    Uncontrolled on 3 medications.  I am making a decision to change patient's benazepril to telmisartan, based on increased anecdotal reports of angioedema with ACE Inhibitors .  I also advised patient to take it at night instead of morning,  as recent  studies have shown a favorable effect on CAD and CVA rates in those who do   Need to rule out secondary causes including RAS and OSA.  Given her known CAD  And underweight stats.  Will rule out RAS  As it is more likely      Relevant Medications   losartan (COZAAR) 100 MG tablet   Other Relevant Orders   Ambulatory referral to Vascular Surgery    Other Visit Diagnoses    Osteopenia, unspecified location    -  Primary   Postmenopausal estrogen deficiency       Relevant Orders   DG Bone Density   Need for 23-polyvalent pneumococcal polysaccharide vaccine       Relevant Orders   Pneumococcal polysaccharide vaccine 23-valent greater than or equal to 2yo subcutaneous/IM (Completed)  Nausea       Relevant Orders   Ambulatory referral to Gastroenterology      I have discontinued Nubia C. Filyaw's lisinopril. I have also changed her gabapentin. Additionally, I am having her start on losartan, promethazine, Tdap, and Zoster Vaccine Adjuvanted. Lastly, I am having her maintain her polyethylene glycol, amLODipine, pantoprazole, Vitamin D (Ergocalciferol), sucralfate, metoprolol succinate, and HYDROcodone-acetaminophen.  Meds ordered this encounter  Medications  . losartan (COZAAR) 100 MG tablet    Sig: Take 1 tablet (100 mg total) by mouth at bedtime as needed.    Dispense:  90 tablet    Refill:  3    Replaces lisinopril.  May substitute telmisartan 80 mg if needed for insurance/availability  . gabapentin (NEURONTIN) 300 MG capsule    Sig: Three times daily as needed for back pain    Dispense:  90 capsule    Refill:  5  . promethazine (PHENERGAN) 12.5 MG tablet    Sig: Take 1 tablet (12.5 mg total) by mouth every 8 (eight) hours as needed for nausea or vomiting.    Dispense:  30 tablet    Refill:  2  . Tdap (BOOSTRIX) 5-2.5-18.5 LF-MCG/0.5 injection    Sig: Inject 0.5 mLs into the muscle once for 1 dose.    Dispense:  0.5 mL    Refill:  0  . Zoster Vaccine Adjuvanted [facility])  injection    Sig: Inject 0.5 mLs into the muscle once for 1 dose.    Dispense:  1 each    Refill:  1    Medications Discontinued During This Encounter  Medication Reason  . lisinopril (PRINIVIL,ZESTRIL) 40 MG tablet   . gabapentin (NEURONTIN) 300 MG capsule     Follow-up: Return in about 6 months (around 12/08/2018).   [name], MD

## 2018-06-09 ENCOUNTER — Telehealth: Payer: Self-pay | Admitting: Internal Medicine

## 2018-06-09 DIAGNOSIS — R634 Abnormal weight loss: Secondary | ICD-10-CM | POA: Insufficient documentation

## 2018-06-09 NOTE — Assessment & Plan Note (Signed)
Secondary to anorexia from chronic nausea despite twice daily use of a  PPI.  She has a histroy of an NSAID unduced ulcer remotely and has not been using them.    Phenergan refilled. GI referral made

## 2018-06-09 NOTE — Assessment & Plan Note (Addendum)
INTERMITTENT and mild (133) ,  Noted again at [facility] . Marland Kitchen  Likely due to COPD and decreased oral intake.

## 2018-06-09 NOTE — Telephone Encounter (Signed)
I am making a referral to [facility] because I am concerned that her gastric ulcer has returned   I am also referring her for a renal artery ultrasound to make sure she doesn't have a blocked artery.  This can cause the blood pressure to stay elevated . This will be odne through [facility]

## 2018-06-09 NOTE — Assessment & Plan Note (Addendum)
Uncontrolled on 3 medications.  I am making a decision to change patient's benazepril to telmisartan, based on increased anecdotal reports of angioedema with ACE Inhibitors .  I also advised patient to take it at night instead of morning,  as recent studies have shown a favorable effect on CAD and CVA rates in those who do   Need to rule out secondary causes including RAS and OSA.  Given her known CAD  And underweight stats.  Will rule out RAS  As it is more likely

## 2018-06-11 NOTE — Telephone Encounter (Signed)
Spoke with pt and informed her of the referrals that were ordered and if she does not here from them in a week to please give [place] a call back to let us know so that we can follow up on them. Pt gave a verbal understanding.

## 2018-06-12 ENCOUNTER — Encounter: Payer: Self-pay | Admitting: *Deleted

## 2018-06-13 ENCOUNTER — Ambulatory Visit: Payer: Medicare Other

## 2018-06-18 ENCOUNTER — Ambulatory Visit: Payer: Medicare Other

## 2018-06-25 ENCOUNTER — Encounter (INDEPENDENT_AMBULATORY_CARE_PROVIDER_SITE_OTHER): Payer: Medicare Other

## 2018-06-25 ENCOUNTER — Other Ambulatory Visit (INDEPENDENT_AMBULATORY_CARE_PROVIDER_SITE_OTHER): Payer: Self-pay | Admitting: Internal Medicine

## 2018-06-25 DIAGNOSIS — I151 Hypertension secondary to other renal disorders: Secondary | ICD-10-CM

## 2018-06-25 DIAGNOSIS — N2889 Other specified disorders of kidney and ureter: Principal | ICD-10-CM

## 2018-06-26 ENCOUNTER — Telehealth: Payer: Self-pay | Admitting: Internal Medicine

## 2018-06-26 ENCOUNTER — Other Ambulatory Visit: Payer: Self-pay | Admitting: Internal Medicine

## 2018-06-26 NOTE — Telephone Encounter (Signed)
Patient calling back  Wanted to suggest that the provider take a look at her remote transmission in his consideration for appointment

## 2018-06-26 NOTE — Telephone Encounter (Signed)
I spoke with the patient. She states that she has seen Dr. Lucky Rathke with [facility]. She is pending surgery for a neurostimulator to be placed. She states at her pre-op workup her EKG was different than her previous so she is needing to come in for an EKG and clearance.  I advised the patient that Dr. Caryl Comes is currently not seeing patients in clinic, but is doing all e-visits as many of our physicians are during  the current [place] pandemic.  She is concerned about her clearance. I inquired if she had spoken with her surgeon about how urgent or elective this is and if he is still wanting to proceed with this during COVID-19.  The patient was unsure. I advised I would call Dr. Pecola Leisure office to [facility] and will call her back.  I spoke with Maudie Mercury at Dr. Pecola Leisure office [phone]. Per Maudie Mercury, the patient is not currently scheduled for surgery. She did see where the patient had a  Pre-op work up. Maudie Mercury will review further with the MD and call back to let me know the urgency of the patient's procedure. Maudie Mercury is aware that we are currently only doing e-visits.

## 2018-06-26 NOTE — Telephone Encounter (Signed)
Patient calling  Patient has an appointment on 4/14 with Dr Caryl Comes - attempted to schedule evisit Patient does not feel like an evisit will be possible, needs an EKG done so she can be cleared to have a procedure on her back Please call to discuss and advise

## 2018-06-27 NOTE — Telephone Encounter (Signed)
Attempted to contact patient reference scheduling and E-Visit with Dr. Caryl Comes. She would be scheduled 07/03/2018 at 12:00pm with a phone call 15 min prior if she is agreeable.

## 2018-06-28 ENCOUNTER — Telehealth: Payer: Self-pay | Admitting: Internal Medicine

## 2018-06-28 NOTE — Telephone Encounter (Signed)
Virtual Visit Pre-Appointment Phone Call  Steps For Call:  1. Confirm consent - "In the setting of the current Covid19 crisis, you are scheduled for a (phone or video) visit with your provider on (date) at (time).  Just as we do with many in-office visits, in order for you to participate in this visit, we must obtain consent.  If you'd like, I can send this to your mychart (if signed up) or email for you to review.  Otherwise, I can obtain your verbal consent now.  All virtual visits are billed to your insurance company just like a normal visit would be.  By agreeing to a virtual visit, we'd like you to understand that the technology does not allow for your provider to perform an examination, and thus may limit your provider's ability to fully assess your condition.  Finally, though the technology is pretty good, we cannot assure that it will always work on either your or our end, and in the setting of a video visit, we may have to convert it to a phone-only visit.  In either situation, we cannot ensure that we have a secure connection.  Are you willing to proceed?"  2. Give patient instructions for WebEx download to smartphone as below if video visit  3. Advise patient to be prepared with any vital sign or heart rhythm information, their current medicines, and a piece of paper and pen handy for any instructions they may receive the day of their visit  4. Inform patient they will receive a phone call 15 minutes prior to their appointment time (may be from unknown caller ID) so they should be prepared to answer  5. Confirm that appointment type is correct in Epic appointment notes (video vs telephone)    TELEPHONE CALL NOTE  [name] has been deemed a candidate for a follow-up tele-health visit to limit community exposure during the Covid-19 pandemic. I spoke with the patient via phone to ensure availability of phone/video source, confirm preferred email & phone number, and discuss  instructions and expectations.  I reminded [name] to be prepared with any vital sign and/or heart rhythm information that could potentially be obtained via home monitoring, at the time of her visit. I reminded [name] to expect a phone call at the time of her visit if her visit.  Did the patient verbally acknowledge consent to treatment? Yes   Ace Gins 06/28/2018 2:08 PM   DOWNLOADING THE [place]  - If Apple, go to [employer] and type in WebEx in the search bar. [place] [employer], the blue/green circle. The app is free but as with any other app downloads, their phone may require them to verify saved payment information or Apple password. The patient does NOT have to create an account.  - If Android, ask patient to go to [employer] and type in WebEx in the search bar. [place] [employer], the blue/green circle. The app is free but as with any other app downloads, their phone may require them to verify saved payment information or Android password. The patient does NOT have to create an account.   CONSENT FOR TELE-HEALTH VISIT - PLEASE REVIEW  I hereby voluntarily request, consent and authorize [facility] and its employed or contracted physicians, physician assistants, nurse practitioners or other licensed health care professionals (the Practitioner), to provide me with telemedicine health care services (the Services") as deemed necessary by the treating Practitioner. I  acknowledge and consent to receive the Services by the Practitioner via telemedicine. I understand that the telemedicine visit will involve communicating with the Practitioner through live audiovisual communication technology and the disclosure of certain medical information by electronic transmission. I acknowledge that I have been given the opportunity to request an in-person assessment or other available alternative prior to the telemedicine visit and am  voluntarily participating in the telemedicine visit.  I understand that I have the right to withhold or withdraw my consent to the use of telemedicine in the course of my care at any time, without affecting my right to future care or treatment, and that the Practitioner or I may terminate the telemedicine visit at any time. I understand that I have the right to inspect all information obtained and/or recorded in the course of the telemedicine visit and may receive copies of available information for a reasonable fee.  I understand that some of the potential risks of receiving the Services via telemedicine include:   Delay or interruption in medical evaluation due to technological equipment failure or disruption;  Information transmitted may not be sufficient (e.g. poor resolution of images) to allow for appropriate medical decision making by the Practitioner; and/or   In rare instances, security protocols could fail, causing a breach of personal health information.  Furthermore, I acknowledge that it is my responsibility to provide information about my medical history, conditions and care that is complete and accurate to the best of my ability. I acknowledge that Practitioner's advice, recommendations, and/or decision may be based on factors not within their control, such as incomplete or inaccurate data provided by me or distortions of diagnostic images or specimens that may result from electronic transmissions. I understand that the practice of medicine is not an exact science and that Practitioner makes no warranties or guarantees regarding treatment outcomes. I acknowledge that I will receive a copy of this consent concurrently upon execution via email to the email address I last provided but may also request a printed copy by calling the office of [facility].    I understand that my insurance will be billed for this visit.   I have read or had this consent read to me.  I understand the  contents of this consent, which adequately explains the benefits and risks of the Services being provided via telemedicine.   I have been provided ample opportunity to ask questions regarding this consent and the Services and have had my questions answered to my satisfaction.  I give my informed consent for the services to be provided through the use of telemedicine in my medical care  By participating in this telemedicine visit I agree to the above.

## 2018-07-02 ENCOUNTER — Other Ambulatory Visit: Payer: Self-pay

## 2018-07-02 ENCOUNTER — Ambulatory Visit (INDEPENDENT_AMBULATORY_CARE_PROVIDER_SITE_OTHER): Payer: Medicare Other

## 2018-07-02 DIAGNOSIS — I151 Hypertension secondary to other renal disorders: Secondary | ICD-10-CM | POA: Diagnosis not present

## 2018-07-02 DIAGNOSIS — N2889 Other specified disorders of kidney and ureter: Secondary | ICD-10-CM | POA: Diagnosis not present

## 2018-07-03 ENCOUNTER — Telehealth (INDEPENDENT_AMBULATORY_CARE_PROVIDER_SITE_OTHER): Payer: Medicare Other | Admitting: Internal Medicine

## 2018-07-03 ENCOUNTER — Encounter: Payer: Self-pay | Admitting: Internal Medicine

## 2018-07-03 VITALS — BP 152/95 | HR 75

## 2018-07-03 DIAGNOSIS — I428 Other cardiomyopathies: Secondary | ICD-10-CM | POA: Diagnosis not present

## 2018-07-03 DIAGNOSIS — I5022 Chronic systolic (congestive) heart failure: Secondary | ICD-10-CM

## 2018-07-03 NOTE — Progress Notes (Signed)
Electrophysiology TeleHealth Note   Due to national recommendations of social distancing due to COVID 19, an audio/video telehealth visit is felt to be most appropriate for this patient at this time.  See MyChart message from today for the patient's consent to telehealth for [facility].   Date:  07/03/2018   ID:  [name], DOB Nov 20, 1940, MRN [id number]  Location: patient's home  Provider location: [address] [place]  Evaluation Performed: Follow-up visit  PCP:  [name], MD  Cardiologist:    Electrophysiologist:  SK   Chief Complaint:  Preoperative evaluation   History of Present Illness:    [name] is a 78 y.o. female who presents via audio/video conferencing for a telehealth visit today.  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 She also had tracings in the hospital with the operating QRS in lead V1 suggesting alternating bundle branch block; however those times, the limb leads still demonstrated left bundle branch block  Interval improvement of LV function As Below    DATE TEST EF   9/15 Myoview 28%   1/16 Cath   % CA min disease  10/17 Echo   55-65 %         Date Cr K Hgb  7/17 0.55 4.1   1/19 0.73 4.9   3/20 0.8 4.1 12.5    She has chronic back pain and has been evaluated at [facility] for consideration of a neuro stimulator.  Her preoperative ECG demonstrated ectopic beats.  She was referred back for cardiovascular evaluation.  Denies chest pain or shortness of breath.  Some limitations when she walks but mostly it is her back.  No edema or palpitations.  No syncope.   The patient denies symptoms of fevers, chills, cough, or new SOB worrisome for COVID 19.    Past Medical History:  Diagnosis Date  . 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 Arthritis    "in about all my joints; for sure in my back"  . Bladder cancer [facility]) urologist-  dr Junious Silk   dx 1995--  recurrent bladder cancer 2015 , s/p TURBT's and chemo instillation's  . Chronic hyponatremia   . Chronic lower back pain   . Chronic systolic (congestive) heart failure [facility])    cardiologist-  dr Rogue Jury  . COPD with emphysema ([employer])   . Coronary artery disease cardiologist-  dr Kathlyn [place]   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% per cardiac cath 03-03-2014  . DDD (degenerative disc disease), thoracolumbar   . Ectopic atrial beats    2015  alternating BBB  . Frequent urination   . Full dentures   . Gait instability    due to chronic low back pain, uses roller walker  . GERD (gastroesophageal reflux disease)   . History of iron deficiency anemia 11/2013   resolved w/ IV Iron  . History of stomach ulcers 11/2013  . Hypertension   . LBBB (left bundle branch block)   . NICM (nonischemic cardiomyopathy) ([place]) last echo 12-31-2015 ef 55-60%   dx 09/ 2015 per echo 20%;  myoview 09/ 2015 ef 28%;  per cardiac cath 12/ 2015 ef 30%;    . Nocturia more than twice per night   .  Scoliosis     Past Surgical History:  Procedure Laterality Date  . BI-VENTRICULAR IMPLANTABLE CARDIOVERTER DEFIBRILLATOR N/A 05/07/2014   Procedure: BI-VENTRICULAR IMPLANTABLE CARDIOVERTER DEFIBRILLATOR  (CRT-D);  Surgeon: Deboraha Sprang, MD;  Location: [facility] CATH LAB;  Service: Cardiovascular;  Laterality: N/A;  . CARDIAC CATHETERIZATION  03-03-2014  dr Kathlyn [place]   [facility]   pLAD 20%, pRCA 20%, dRCA 50%, RPLS 50%;  ef 30%, mild elevated LVEDP, mild gradient across aortic valve LVOT  . CARDIOVASCULAR STRESS TEST  11/25/2013   High risk nuclear study w/ large high severity inferior wall perfusion defect on stress and rest images, large mild severity anteroseptal wall perfusion defect on stress and rest images, No inducible ischemia/  global moderate hypokinesis, ef 28%  . CATARACT EXTRACTION W/ INTRAOCULAR LENS  IMPLANT, BILATERAL Bilateral right 12-2013 / left  02-2014  . CYSTOSCOPY W/ RETROGRADES Bilateral 01/06/2015   Procedure: CYSTOSCOPY WITH  BLADDER BIOPSY BILATERAL RETROGRADE PYELOGRAM,INSTILLATION OF MITOMYCIN C;  Surgeon: [name], MD;  Location: [facility] ORS;  Service: Urology;  Laterality: Bilateral;  . CYSTOSCOPY WITH BIOPSY N/A 10/06/2015   Procedure: CYSTO WITH BLADDER BIOPSY, FULGERATION, CHEMO IRRIGATION EPIRUBICIN IN PACU;  Surgeon: [name], MD;  Location: [facility];  Service: Urology;  Laterality: N/A;  . CYSTOSCOPY WITH FULGERATION N/A 06/30/2017   Procedure: Marland Kitchen CYSTOSCOPY WITH [place] FULGERATION/ BLADDER BIOPSY/ INSTILLATION OF EPIRUBICIN;  Surgeon: [name], MD;  Location[facility];  Service: Urology;  Laterality: N/A;  . CYSTOSCOPY WITH RETROGRADE PYELOGRAM, URETEROSCOPY AND STENT PLACEMENT Bilateral 04/18/2014   Procedure: CYSTOSCOPY WITH RETROGRADE PYELOGRAM;  Surgeon: [name], MD;  Location: [facility];  Service: Urology;  Laterality: Bilateral;  . ESOPHAGOGASTRODUODENOSCOPY N/A 11/28/2013   Procedure: ESOPHAGOGASTRODUODENOSCOPY (EGD);  Surgeon: Arta Silence, MD;  [facility] ENDOSCOPY;  Service: Endoscopy;  Laterality: N/A;  . LUMBAR [place]  1980's   "ruptured disc"  . TRANSTHORACIC ECHOCARDIOGRAM  12/31/2015   dr Caryl Comes   ef 35-456, grade 1 diastolic dysfunction/ mild MR/ septal motion showed abnormal function and dyssynergy  . TRANSURETHRAL RESECTION OF BLADDER  1995  . TRANSURETHRAL RESECTION OF BLADDER TUMOR N/A 12/13/2013   Procedure: TRANSURETHRAL RESECTION OF BLADDER TUMOR (TURBT);  Surgeon: [name], MD;  Location: [facility];  Service: Urology;  Laterality: N/A;  . TRANSURETHRAL RESECTION OF BLADDER TUMOR N/A 01/17/2014   Procedure: TRANSURETHRAL RESECTION OF BLADDER TUMOR (TURBT);  Surgeon: [name], MD;  Location: [facility];   Service: Urology;  Laterality: N/A;  . TRANSURETHRAL RESECTION OF BLADDER TUMOR N/A 04/18/2014   Procedure: TRANSURETHRAL RESECTION OF BLADDER TUMOR (TURBT), CYSTOGRAM;  Surgeon: [name], MD;  Location: [facility];  Service: Urology;  Laterality: N/A;    Current Outpatient Medications  Medication Sig Dispense Refill  . amLODipine (NORVASC) 2.5 MG tablet TAKE 1 TABLET BY MOUTH DAILY 90 tablet 2  . gabapentin (NEURONTIN) 300 MG capsule Three times daily as needed for back pain 90 capsule 5  . HYDROcodone-acetaminophen (NORCO) 10-325 MG tablet Take 1 tablet by mouth every 4 (four) hours as needed. To fill on or after: 05/31/2018, 06/30/2026, 07/29/2018 180 tablet 0  . losartan (COZAAR) 100 MG tablet Take 1 tablet (100 mg total) by mouth at bedtime as needed. 90 tablet 3  . metoprolol succinate (TOPROL-XL) 25 MG 24 hr tablet Take 1 tablet (25 mg total) by mouth daily. 90 tablet 3  . pantoprazole (PROTONIX) 40 MG tablet TAKE 1 TABLET(40 MG) BY MOUTH TWICE DAILY 180 tablet 1  . polyethylene glycol (  MIRALAX / GLYCOLAX) packet Take 17 g by mouth daily as needed for mild constipation.     . promethazine (PHENERGAN) 12.5 MG tablet Take 1 tablet (12.5 mg total) by mouth every 8 (eight) hours as needed for nausea or vomiting. 30 tablet 2  . sucralfate (CARAFATE) 1 g tablet TAKE 1 TABLET BY MOUTH FOUR TIMES DAILY EVERY NIGHT AT BEDTIME WITH MEALS 120 tablet 0  . Vitamin D, Ergocalciferol, (DRISDOL) 50000 units CAPS capsule TK ONE C PO Q WEEK FOR 8 WEEKS  0   No current facility-administered medications for this visit.    Facility-Administered Medications Ordered in Other Visits  Medication Dose Route Frequency Provider Last Rate Last Dose  . epirubicin (ELLENCE) 50 mg in sodium chloride 0.9 % bladder instillation  50 mg Bladder Instillation Once [name], MD        Allergies:   Patient has no known allergies.   Social History:  The patient  reports that she has been smoking cigarettes. She  has a 27.50 pack-year smoking history. She has never used smokeless tobacco. She reports that she does not drink alcohol or use drugs.   Family History:  The patient's   family history includes Cancer in her brother and mother.   ROS:  Please see the history of present illness.   All other systems are personally reviewed and negative.    Exam:    Vital Signs:  BP (!) 152/95   Pulse 75     Well appearing, alert and conversant, regular work of breathing,  good skin color Eyes- anicteric, neuro- grossly intact, skin- no apparent rash or lesions or cyanosis, mouth- oral mucosa is pink   Labs/Other Tests and Data Reviewed:    Recent Labs: No results found for requested labs within last 8760 hours.   Wt Readings from Last 3 Encounters:  06/07/18 116 lb 6.4 oz (52.8 kg)  05/08/18 120 lb (54.4 kg)  02/06/18 120 lb (54.4 kg)     Other studies personally reviewed:    Last device remote is reviewed from [facility] PDF dated 3/20  which reveals normal device function, no arrhythmias     ASSESSMENT & PLAN:    Cardiomyopathy nonischemic  Congestive heart failure-chronic systolic  COPD/emphysema  Hypertension  Left bundle branch block  Sinus bradycardia-borderline  CRT-D Medtronic  Back Pain  Preoperative evaluation  Euvolemic continue current meds  Preoperatively, her functional status seems quite stable.  Multiple ECGs were reviewed; device interrogations were reviewed.  Ectopics both atrial and possibly ventricular have been noted back to 2017.  Ventricular pacing percentages are in the low 90s suggesting a 6-8% ectopic burden.  At this point her cardiovascular function seems quite stable.  It has been 3 years since she underwent LV assessment and we will repeat that.    COVID 19 screen The patient denies symptoms of COVID 19 at this time.  The importance of social distancing was discussed today.  Follow-up:  6 months Next remote:As above   Current medicines  are reviewed at length with the patient today.   The patient does not have concerns regarding her medicines.  The following changes were made today:  none  Labs/ tests ordered today include:   No orders of the defined types were placed in this encounter.   Future tests ( post COVID )  Echo in 6 weeks    Patient Risk:  after full review of this patients clinical status, I feel that they are at moderate risk at  this time.  Today, I have spent 28  minutes with the patient with telehealth technology discussing the above.  Signed, Virl Axe, MD  07/03/2018 12:47 PM     [facility] [address] [place] [address] [phone] (office) [phone] (fax)

## 2018-07-03 NOTE — Patient Instructions (Signed)
Medication Instructions:  - Your physician recommends that you continue on your current medications as directed. Please refer to the Current Medication list given to you today.  If you need a refill on your cardiac medications before your next appointment, please call your pharmacy.   Lab work: - none ordered  If you have labs (blood work) drawn today and your tests are completely normal, you will receive your results only by: Marland Kitchen MyChart Message (if you have MyChart) OR . A paper copy in the mail If you have any lab test that is abnormal or we need to change your treatment, we will call you to review the results.  Testing/Procedures: - Your physician has requested that you have an echocardiogram in 6 weeks. Echocardiography is a painless test that uses sound waves to create images of your heart. It provides your doctor with information about the size and shape of your heart and how well your heart's chambers and valves are working. This procedure takes approximately one hour. There are no restrictions for this procedure.   Follow-Up: At [facility], you and your health needs are our priority.  As part of our continuing mission to provide you with exceptional heart care, we have created designated Provider Care Teams.  These Care Teams include your primary Cardiologist (physician) and Advanced Practice Providers (APPs -  Physician Assistants and Nurse Practitioners) who all work together to provide you with the care you need, when you need it. You will need a follow up appointment in 6 months with Dr. Caryl Comes.  Please call our office 2 months in advance to schedule this appointment. (Call in early August to schedule for October with Dr. Caryl Comes)  - Remote monitoring is used to monitor your Pacemaker of ICD from home. This monitoring reduces the number of office visits required to check your device to one time per year. It allows [place] to keep an eye on the functioning of your device to ensure it is  working properly. You are scheduled for a device check from home on 08/28/2018. You may send your transmission at any time that day. If you have a wireless device, the transmission will be sent automatically. After your physician reviews your transmission, you will receive a postcard with your next transmission date.   Any Other Special Instructions Will Be Listed Below (If Applicable). - N/A     Echocardiogram An echocardiogram is a procedure that uses painless sound waves (ultrasound) to produce an image of the heart. Images from an echocardiogram can provide important information about:  Signs of coronary artery disease (CAD).  Aneurysm detection. An aneurysm is a weak or damaged part of an artery wall that bulges out from the normal force of blood pumping through the body.  Heart size and shape. Changes in the size or shape of the heart can be associated with certain conditions, including heart failure, aneurysm, and CAD.  Heart muscle function.  Heart valve function.  Signs of a past heart attack.  Fluid buildup around the heart.  Thickening of the heart muscle.  A tumor or infectious growth around the heart valves. Tell a health care provider about:  Any allergies you have.  All medicines you are taking, including vitamins, herbs, eye drops, creams, and over-the-counter medicines.  Any blood disorders you have.  Any surgeries you have had.  Any medical conditions you have.  Whether you are pregnant or may be pregnant. What are the risks? Generally, this is a safe procedure. However, problems may  occur, including:  Allergic reaction to dye (contrast) that may be used during the procedure. What happens before the procedure? No specific preparation is needed. You may eat and drink normally. What happens during the procedure?   An IV tube may be inserted into one of your veins.  You may receive contrast through this tube. A contrast is an injection that improves  the quality of the pictures from your heart.  A gel will be applied to your chest.  A wand-like tool (transducer) will be moved over your chest. The gel will help to transmit the sound waves from the transducer.  The sound waves will harmlessly bounce off of your heart to allow the heart images to be captured in real-time motion. The images will be recorded on a computer. The procedure may vary among health care providers and hospitals. What happens after the procedure?  You may return to your normal, everyday life, including diet, activities, and medicines, unless your health care provider tells you not to do that. Summary  An echocardiogram is a procedure that uses painless sound waves (ultrasound) to produce an image of the heart.  Images from an echocardiogram can provide important information about the size and shape of your heart, heart muscle function, heart valve function, and fluid buildup around your heart.  You do not need to do anything to prepare before this procedure. You may eat and drink normally.  After the echocardiogram is completed, you may return to your normal, everyday life, unless your health care provider tells you not to do that. This information is not intended to replace advice given to you by your health care provider. Make sure you discuss any questions you have with your health care provider. Document Released: 03/04/2000 Document Revised: 04/09/2016 Document Reviewed: 04/09/2016 Elsevier Interactive Patient Education  2019 [employer].

## 2018-07-09 ENCOUNTER — Ambulatory Visit (INDEPENDENT_AMBULATORY_CARE_PROVIDER_SITE_OTHER): Payer: Medicare Other

## 2018-07-09 ENCOUNTER — Other Ambulatory Visit: Payer: Self-pay

## 2018-07-09 DIAGNOSIS — Z0000 Encounter for general adult medical examination without abnormal findings: Secondary | ICD-10-CM

## 2018-07-09 NOTE — Progress Notes (Addendum)
Subjective:   [name] is a 78 y.o. female who presents for Medicare Annual (Subsequent) preventive examination.  Review of Systems:  No ROS.  Medicare Wellness Visit. Additional risk factors are reflected in the social history. Cardiac Risk Factors include: advanced age (>22men, >4 women);hypertension;smoking/ tobacco exposure     Objective:     Vitals: There were no vitals taken for this visit.  There is no height or weight on file to calculate BMI.  UTA vital signs, virtual visit. Advanced Directives 07/09/2018 05/08/2018 02/06/2018 12/12/2017 11/14/2017 10/09/2017 07/31/2017  Does Patient Have a Medical Advance Directive? Yes No Yes Yes Yes No Yes  Type of Paramedic of [place];Living will - [place];Living will [place];Living will Living will - [place];Living will  Does patient want to make changes to medical advance directive? No - Patient declined - - - - - No - Patient declined  Copy of [place] in Chart? Yes - validated most recent copy scanned in chart (See row information) - Yes - validated most recent copy scanned in chart (See row information) - - - Yes  Would patient like information on creating a medical advance directive? - No - Patient declined - - - - -    Tobacco Social History   Tobacco Use  Smoking Status Current Every Day Smoker  . Packs/day: 0.50  . Years: 55.00  . Pack years: 27.50  . Types: Cigarettes  Smokeless Tobacco Never Used  Tobacco Comment   06-27-2017 per pt currently smokes 10 cig's per day , started smoking age 40     Ready to quit: Not Answered Counseling given: Not Answered Comment: 06-27-2017 per pt currently smokes 10 cig's per day , started smoking age 23   Clinical Intake:  Pre-visit preparation completed: Yes        Diabetes: No  How often do you need to have someone help you when you read instructions, pamphlets,  or other written materials from your doctor or pharmacy?: 2 - Rarely  Interpreter Needed?: No     Past Medical History:  Diagnosis Date  . 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 Arthritis    "in about all my joints; for sure in my back"  . Bladder cancer [facility]) urologist-  dr Junious Silk   dx 1995--  recurrent bladder cancer 2015 , s/p TURBT's and chemo instillation's  . Chronic hyponatremia   . Chronic lower back pain   . Chronic systolic (congestive) heart failure [facility])    cardiologist-  dr Rogue Jury  . COPD with emphysema ([place])   . Coronary artery disease cardiologist-  dr Kathlyn [place]   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% per cardiac cath 03-03-2014  . DDD (degenerative disc disease), thoracolumbar   . Ectopic atrial beats    2015  alternating BBB  . Frequent urination   . Full dentures   . Gait instability    due to chronic low back pain, uses roller walker  . GERD (gastroesophageal reflux disease)   . History of iron deficiency anemia 11/2013   resolved w/ IV Iron  . History of stomach ulcers 11/2013  . Hypertension   . LBBB (left bundle branch block)   . NICM (nonischemic cardiomyopathy) ([place]) last echo 12-31-2015 ef 55-60%   dx 09/ 2015 per echo 20%;  myoview 09/ 2015 ef 28%;  per cardiac cath 12/ 2015 ef 30%;    . Nocturia more than twice per night   . Scoliosis    Past Surgical History:  Procedure Laterality Date  . BI-VENTRICULAR IMPLANTABLE CARDIOVERTER DEFIBRILLATOR N/A 05/07/2014   Procedure: BI-VENTRICULAR IMPLANTABLE CARDIOVERTER DEFIBRILLATOR  (CRT-D);  Surgeon: Deboraha Sprang, MD;  Location: [facility] CATH LAB;  Service: Cardiovascular;  Laterality: N/A;  . CARDIAC CATHETERIZATION  03-03-2014  dr Kathlyn [place]   [facility]   pLAD 20%, pRCA 20%, dRCA 50%, RPLS 50%;  ef 30%, mild elevated LVEDP, mild gradient across aortic valve LVOT  . CARDIOVASCULAR STRESS TEST  11/25/2013   High  risk nuclear study w/ large high severity inferior wall perfusion defect on stress and rest images, large mild severity anteroseptal wall perfusion defect on stress and rest images, No inducible ischemia/ global moderate hypokinesis, ef 28%  . CATARACT EXTRACTION W/ INTRAOCULAR LENS  IMPLANT, BILATERAL Bilateral right 12-2013 / left  02-2014  . CYSTOSCOPY W/ RETROGRADES Bilateral 01/06/2015   Procedure: CYSTOSCOPY WITH  BLADDER BIOPSY BILATERAL RETROGRADE PYELOGRAM,INSTILLATION OF MITOMYCIN C;  Surgeon: [name], MD;  Location: [facility] ORS;  Service: Urology;  Laterality: Bilateral;  . CYSTOSCOPY WITH BIOPSY N/A 10/06/2015   Procedure: CYSTO WITH BLADDER BIOPSY, FULGERATION, CHEMO IRRIGATION EPIRUBICIN IN PACU;  Surgeon: [name], MD;  Location: [facility];  Service: Urology;  Laterality: N/A;  . CYSTOSCOPY WITH FULGERATION N/A 06/30/2017   Procedure: Marland Kitchen CYSTOSCOPY WITH [place] FULGERATION/ BLADDER BIOPSY/ INSTILLATION OF EPIRUBICIN;  Surgeon: [name], MD;  Location[facility];  Service: Urology;  Laterality: N/A;  . CYSTOSCOPY WITH RETROGRADE PYELOGRAM, URETEROSCOPY AND STENT PLACEMENT Bilateral 04/18/2014   Procedure: CYSTOSCOPY WITH RETROGRADE PYELOGRAM;  Surgeon: [name], MD;  Location: [facility];  Service: Urology;  Laterality: Bilateral;  . ESOPHAGOGASTRODUODENOSCOPY N/A 11/28/2013   Procedure: ESOPHAGOGASTRODUODENOSCOPY (EGD);  Surgeon: Arta Silence, MD;  Location: [facility] ENDOSCOPY;  Service: Endoscopy;  Laterality: N/A;  . LUMBAR [place]  1980's   "ruptured disc"  . TRANSTHORACIC ECHOCARDIOGRAM  12/31/2015   dr Caryl Comes   ef 16-109, grade 1 diastolic dysfunction/ mild MR/ septal motion showed abnormal function and dyssynergy  . TRANSURETHRAL RESECTION OF BLADDER  1995  . TRANSURETHRAL RESECTION OF BLADDER TUMOR N/A 12/13/2013   Procedure: TRANSURETHRAL RESECTION OF BLADDER TUMOR (TURBT);  Surgeon: [name], MD;  Location: [facility];  Service:  Urology;  Laterality: N/A;  . TRANSURETHRAL RESECTION OF BLADDER TUMOR N/A 01/17/2014   Procedure: TRANSURETHRAL RESECTION OF BLADDER TUMOR (TURBT);  Surgeon: [name], MD;  Location: [facility];  Service: Urology;  Laterality: N/A;  . TRANSURETHRAL RESECTION OF BLADDER TUMOR N/A 04/18/2014   Procedure: TRANSURETHRAL RESECTION OF BLADDER TUMOR (TURBT), CYSTOGRAM;  Surgeon: [name], MD;  Location: [facility];  Service: Urology;  Laterality: N/A;   Family History  Problem Relation Age of Onset  . Cancer Mother   . Cancer Brother    Social History   Socioeconomic History  . Marital status: Widowed    Spouse name: Not on file  . Number of children: Not on file  . Years of education: Not on file  . Highest education level: Not on file  Occupational History  . Not on file  Social Needs  . Financial resource strain: Not hard at all  . Food insecurity:    Worry: Never true    Inability: Never true  . Transportation needs:    Medical: No    Non-medical: No  Tobacco Use  . Smoking  status: Current Every Day Smoker    Packs/day: 0.50    Years: 55.00    Pack years: 27.50    Types: Cigarettes  . Smokeless tobacco: Never Used  . Tobacco comment: 06-27-2017 per pt currently smokes 10 cig's per day , started smoking age 41  Substance and Sexual Activity  . Alcohol use: No  . Drug use: No  . Sexual activity: Never  Lifestyle  . Physical activity:    Days per week: 7 days    Minutes per session: 30 min  . Stress: Not at all  Relationships  . Social connections:    Talks on phone: Not on file    Gets together: Not on file    Attends religious service: Not on file    Active member of club or organization: Not on file    Attends meetings of clubs or organizations: Not on file    Relationship status: Not on file  Other Topics Concern  . Not on file  Social History Narrative  . Not on file    Outpatient Encounter Medications as of 07/09/2018  Medication Sig  . amLODipine  (NORVASC) 2.5 MG tablet TAKE 1 TABLET BY MOUTH DAILY  . gabapentin (NEURONTIN) 300 MG capsule Three times daily as needed for back pain  . HYDROcodone-acetaminophen (NORCO) 10-325 MG tablet Take 1 tablet by mouth every 4 (four) hours as needed. To fill on or after: 05/31/2018, 06/30/2026, 07/29/2018  . losartan (COZAAR) 100 MG tablet Take 1 tablet (100 mg total) by mouth at bedtime as needed.  . metoprolol succinate (TOPROL-XL) 25 MG 24 hr tablet Take 1 tablet (25 mg total) by mouth daily.  . pantoprazole (PROTONIX) 40 MG tablet TAKE 1 TABLET(40 MG) BY MOUTH TWICE DAILY  . polyethylene glycol (MIRALAX / GLYCOLAX) packet Take 17 g by mouth daily as needed for mild constipation.   . promethazine (PHENERGAN) 12.5 MG tablet Take 1 tablet (12.5 mg total) by mouth every 8 (eight) hours as needed for nausea or vomiting.  . sucralfate (CARAFATE) 1 g tablet TAKE 1 TABLET BY MOUTH FOUR TIMES DAILY EVERY NIGHT AT BEDTIME WITH MEALS  . Vitamin D, Ergocalciferol, (DRISDOL) 50000 units CAPS capsule TK ONE C PO Q WEEK FOR 8 WEEKS   Facility-Administered Encounter Medications as of 07/09/2018  Medication  . epirubicin (ELLENCE) 50 mg in sodium chloride 0.9 % bladder instillation    Activities of Daily Living In your present state of health, do you have any difficulty performing the following activities: 07/09/2018  Hearing? N  Vision? N  Difficulty concentrating or making decisions? N  Walking or climbing stairs? Y  Comment Unsteady gait.  Walker in use.   Dressing or bathing? N  Doing errands, shopping? N  Preparing Food and eating ? N  Using the Toilet? N  In the past six months, have you accidently leaked urine? Y  Comment Managed with daily liner  Do you have problems with loss of bowel control? N  Managing your Medications? N  Managing your Finances? N  Housekeeping or managing your Housekeeping? N  Comment Daughter assists as needed  Some recent data might be hidden    Patient Care Team:  [name], MD as PCP - General (Internal Medicine) Briscoe Deutscher, MD (Family Medicine)    Assessment:   This is a routine wellness examination for [name].  I connected with patient 07/09/18 at  2:00 PM EDT by a video enabled telemedicine application and verified that I am speaking with  the correct person using two identifiers. Patient stated full name and DOB. Patient gave permission to continue with virtual visit. Patient's location was at home and Nurse's location was at [place] office.   Health Screenings  Colonoscopy -12/28/1998 Bone Density -next scheduled 06/07/18 Glaucoma -none reported Hearing -demonstrated normal hearing during conversation. Cholesterol -03/24/17 (188) Dental-dentures Vision- wears glasses  Social  Alcohol intake -no Smoking history- current Smokers in home? Self. Smokes 10 cigarettes daily.  Illicit drug use? none Exercise -continued physical therapy  Diet -regular Sexually Active -never  Safety  Patient feels safe at home. Lives with her daughter.  Patient does have smoke detectors at home  Patient does wear sunscreen or protective clothing when in direct sunlight  Patient does wear seat belt when driving or riding with others.   Activities of Daily Living Patient can do their own household chores. Denies needing assistance with: driving, feeding themselves, getting from bed to chair, getting to the toilet, bathing/showering, dressing, managing money, or preparing meals. Daughter assists with ADLs as needed.  Depression Screen Patient denies losing interest in daily life, feeling hopeless, or crying easily over simple problems.   Fall Screen Patient denies being afraid of falling or falling in the last year.   Memory Screen Patient denies problems with memory, misplacing items, and is able to balance checkbook/bank accounts.  Patient is alert, normal appearance, oriented to person/place/and time. Correctly identified the president of the [place],  recall of 2/3 objects, and performing simple calculations.  Patient displays appropriate judgement and can read correct time from watch face.   Immunizations The following Immunizations were discussed: Influenza, shingles, pneumonia, and tetanus.   Other Providers Patient Care Team: [name], MD as PCP - General (Internal Medicine) Briscoe Deutscher, MD (Family Medicine)  Exercise Activities and Dietary recommendations Current Exercise Habits: Home exercise routine, Time (Minutes): 20, Frequency (Times/Week): 5, Weekly Exercise (Minutes/Week): 100, Intensity: Mild  Goals      Patient Stated   . Increase physical activity (pt-stated)     Continue home physical therapy exercises daily    . Obtain Annual Eye (retinal)  Exam  (pt-stated)       Fall Risk Fall Risk  07/09/2018 05/08/2018 02/06/2018 12/12/2017 11/14/2017  Falls in the past year? 0 0 0 No No   Depression Screen PHQ 2/9 Scores 07/09/2018 05/08/2018 02/06/2018 12/12/2017  PHQ - 2 Score 0 0 0 0     Cognitive Function     6CIT Screen 07/09/2018 06/09/2017 05/13/2016  What Year? 0 points 0 points 0 points  What month? 0 points 0 points 0 points  What time? 0 points 0 points 0 points  Count back from 20 0 points 0 points 0 points  Months in reverse 0 points 0 points 0 points  Repeat phrase 0 points 0 points 0 points  Total Score 0 0 0    Immunization History  Administered Date(s) Administered  . Influenza, High Dose Seasonal PF 01/29/2018  . Influenza,inj,Quad PF,6+ Mos 12/27/2013  . Influenza-Unspecified 12/24/2015, 02/17/2017  . Pneumococcal Conjugate-13 12/27/2013  . Pneumococcal Polysaccharide-23 06/07/2018   Screening Tests Health Maintenance  Topic Date Due  . TETANUS/TDAP  03/21/1958  . DEXA SCAN  03/21/2004  . INFLUENZA VACCINE  10/20/2018  . PNA vac Low Risk Adult  Completed      Plan:    End of life planning; Advance aging; Advanced directives discussed. Copy of current HCPOA/Living Will on file.     Notify your doctor when Tdap and  shingles vaccine complete.  I have personally reviewed and noted the following in the patient's chart:   . Medical and social history . Use of alcohol, tobacco or illicit drugs  . Current medications and supplements . Functional ability and status . Nutritional status . Physical activity . Advanced directives . List of other physicians . Hospitalizations, surgeries, and ER visits in previous 12 months . Vitals . Screenings to include cognitive, depression, and falls . Referrals and appointments  In addition, I have reviewed and discussed with patient certain preventive protocols, quality metrics, and best practice recommendations. A written personalized care plan for preventive services as well as general preventive health recommendations were provided to patient.     OBrien-Blaney, Demetrie Borge L, LPN  [id number]     I have reviewed the above information and agree with above.   Deborra Medina, MD

## 2018-07-09 NOTE — Patient Instructions (Addendum)
  [name] , Thank you for taking time to come for your Medicare Wellness Visit. I appreciate your ongoing commitment to your health goals. Please review the following plan we discussed and let me know if I can assist you in the future.   Notify your doctor upon completion of tdap and shingles vaccine.   These are the goals we discussed: Goals      Patient Stated   . Increase physical activity (pt-stated)     Continue home physical therapy exercises daily    . Obtain Annual Eye (retinal)  Exam  (pt-stated)       This is a list of the screening recommended for you and due dates:  Health Maintenance  Topic Date Due  . Tetanus Vaccine  03/21/1958  . DEXA scan (bone density measurement)  03/21/2004  . Flu Shot  10/20/2018  . Pneumonia vaccines  Completed

## 2018-07-12 ENCOUNTER — Encounter

## 2018-07-12 ENCOUNTER — Ambulatory Visit: Payer: Medicare Other | Admitting: Psychology

## 2018-07-13 ENCOUNTER — Encounter: Payer: Self-pay | Admitting: Internal Medicine

## 2018-07-13 DIAGNOSIS — I739 Peripheral vascular disease, unspecified: Secondary | ICD-10-CM | POA: Insufficient documentation

## 2018-07-18 ENCOUNTER — Ambulatory Visit (INDEPENDENT_AMBULATORY_CARE_PROVIDER_SITE_OTHER): Payer: Medicare Other | Admitting: Internal Medicine

## 2018-07-18 ENCOUNTER — Encounter: Payer: Self-pay | Admitting: Internal Medicine

## 2018-07-18 ENCOUNTER — Other Ambulatory Visit: Payer: Self-pay

## 2018-07-18 VITALS — BP 150/100

## 2018-07-18 DIAGNOSIS — I10 Essential (primary) hypertension: Secondary | ICD-10-CM | POA: Diagnosis not present

## 2018-07-18 DIAGNOSIS — I739 Peripheral vascular disease, unspecified: Secondary | ICD-10-CM

## 2018-07-18 DIAGNOSIS — E871 Hypo-osmolality and hyponatremia: Secondary | ICD-10-CM

## 2018-07-18 DIAGNOSIS — M4802 Spinal stenosis, cervical region: Secondary | ICD-10-CM

## 2018-07-18 DIAGNOSIS — E782 Mixed hyperlipidemia: Secondary | ICD-10-CM

## 2018-07-18 DIAGNOSIS — J439 Emphysema, unspecified: Secondary | ICD-10-CM

## 2018-07-18 DIAGNOSIS — Z7189 Other specified counseling: Secondary | ICD-10-CM

## 2018-07-18 MED ORDER — LOSARTAN POTASSIUM 100 MG PO TABS: 100.0000 mg | ORAL_TABLET | Freq: Every day | ORAL | 1 refills | Status: DC

## 2018-07-18 MED ORDER — ROSUVASTATIN CALCIUM 5 MG PO TABS: 5.0000 mg | ORAL_TABLET | Freq: Every day | ORAL | 5 refills | Status: DC

## 2018-07-18 NOTE — Progress Notes (Signed)
Telephone Note  This visit type was conducted due to national recommendations for restrictions regarding the COVID-19 pandemic (e.g. social distancing).  This format is felt to be most appropriate for this patient at this time.  All issues noted in this document were discussed and addressed.  No physical exam was performed (except for noted visual exam findings with Video Visits).   I connected with@ on 07/18/18 at  4:00 PM EDT by telephone and verified that I am speaking with the correct person using two identifiers. Location patient: home Location provider: work or home office Persons participating in the virtual visit: patient, provider  I discussed the limitations, risks, security and privacy concerns of performing an evaluation and management service by telephone and the availability of in person appointments. I also discussed with the patient that there may be a patient responsible charge related to this service. The patient expressed understanding and agreed to proceed.   Reason for visit: follow up ON CHRONIC conditions including hypertension l   HPI:  HTN:  Tolerated medication  change from lisinopril to losartan.  Stopped taking it bc rx was erroneously written as  "prn" .  Advised to resume it daily qhs   PAD: < 60% stenosis of renal arteries bilaterally by recent renal artery doppler.  Discussed need for statin therapy, along with the risks and benefits of statins.  Willing to start crestor.  Tobacco abuse:  Last quit attempts was 4-5 years ago when she was very ill and hospitalized.  Quit for 4 months while convalescing with daughter [name].  NOw living with [name] but smoking on her front porch only..  Does not want to quit.   Chronic back pain:  Plans for spinal cord stimulator vs decompressive surgery at [facility]  on hold due to COVID 19.  S/p AICD, also  waiting for cardiology clearance from Dr. Caryl Comes   Using a walker,  And vicodin prescribed by pain clinic    Adjustment disorder: referred by spine clinic to psychology for smoking cessation. doesn't plan on going. .  ROS: Patient denies headache, fevers, malaise, unintentional weight loss, skin rash, eye pain, sinus congestion and sinus pain, sore throat, dysphagia,  hemoptysis , cough, dyspnea, wheezing, chest pain, palpitations, orthopnea, edema, abdominal pain, nausea, melena, diarrhea, constipation, flank pain, dysuria, hematuria, urinary  Frequency, nocturia, numbness, tingling, seizures,  Focal weakness, Loss of consciousness,  Tremor, insomnia, depression, anxiety, and suicidal ideation.      Past Medical History:  Diagnosis Date  . 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 Arthritis    "in about all my joints; for sure in my back"  . Bladder cancer [facility]) urologist-  dr Junious Silk   dx 1995--  recurrent bladder cancer 2015 , s/p TURBT's and chemo instillation's  . Chronic hyponatremia   . Chronic lower back pain   . Chronic systolic (congestive) heart failure [facility])    cardiologist-  dr Rogue Jury  . COPD with emphysema ([place])   . Coronary artery disease cardiologist-  dr Kathlyn [place]   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% per cardiac cath 03-03-2014  . DDD (degenerative disc disease), thoracolumbar   . Ectopic atrial beats    2015  alternating BBB  . Frequent urination   . Full dentures   . Gait instability    due to chronic low back pain, uses roller walker  . GERD (gastroesophageal reflux disease)   .  History of iron deficiency anemia 11/2013   resolved w/ IV Iron  . History of stomach ulcers 11/2013  . Hypertension   . LBBB (left bundle branch block)   . NICM (nonischemic cardiomyopathy) ([place]) last echo 12-31-2015 ef 55-60%   dx 09/ 2015 per echo 20%;  myoview 09/ 2015 ef 28%;  per cardiac cath 12/ 2015 ef 30%;    . Nocturia more than twice per night   . Scoliosis     Past Surgical  History:  Procedure Laterality Date  . BI-VENTRICULAR IMPLANTABLE CARDIOVERTER DEFIBRILLATOR N/A 05/07/2014   Procedure: BI-VENTRICULAR IMPLANTABLE CARDIOVERTER DEFIBRILLATOR  (CRT-D);  Surgeon: Deboraha Sprang, MD;  Location: [facility] CATH LAB;  Service: Cardiovascular;  Laterality: N/A;  . CARDIAC CATHETERIZATION  03-03-2014  dr Kathlyn [place]   [facility]   pLAD 20%, pRCA 20%, dRCA 50%, RPLS 50%;  ef 30%, mild elevated LVEDP, mild gradient across aortic valve LVOT  . CARDIOVASCULAR STRESS TEST  11/25/2013   High risk nuclear study w/ large high severity inferior wall perfusion defect on stress and rest images, large mild severity anteroseptal wall perfusion defect on stress and rest images, No inducible ischemia/ global moderate hypokinesis, ef 28%  . CATARACT EXTRACTION W/ INTRAOCULAR LENS  IMPLANT, BILATERAL Bilateral right 12-2013 / left  02-2014  . CYSTOSCOPY W/ RETROGRADES Bilateral 01/06/2015   Procedure: CYSTOSCOPY WITH  BLADDER BIOPSY BILATERAL RETROGRADE PYELOGRAM,INSTILLATION OF MITOMYCIN C;  Surgeon: [name], MD;  Location: [facility];  Service: Urology;  Laterality: Bilateral;  . CYSTOSCOPY WITH BIOPSY N/A 10/06/2015   Procedure: CYSTO WITH BLADDER BIOPSY, FULGERATION, CHEMO IRRIGATION EPIRUBICIN IN PACU;  Surgeon: [name], MD;  Location: [facility];  Service: Urology;  Laterality: N/A;  . CYSTOSCOPY WITH FULGERATION N/A 06/30/2017   Procedure: Marland Kitchen CYSTOSCOPY WITH [place] FULGERATION/ BLADDER BIOPSY/ INSTILLATION OF EPIRUBICIN;  Surgeon: [name], MD;  Location: [facility];  Service: Urology;  Laterality: N/A;  . CYSTOSCOPY WITH RETROGRADE PYELOGRAM, URETEROSCOPY AND STENT PLACEMENT Bilateral 04/18/2014   Procedure: CYSTOSCOPY WITH RETROGRADE PYELOGRAM;  Surgeon: [name], MD;  Location: [facility];  Service: Urology;  Laterality: Bilateral;  . ESOPHAGOGASTRODUODENOSCOPY N/A 11/28/2013   Procedure: ESOPHAGOGASTRODUODENOSCOPY (EGD);  Surgeon: Arta Silence, MD;  Location: [facility] ENDOSCOPY;  Service: Endoscopy;  Laterality: N/A;  . LUMBAR [place]  1980's   "ruptured disc"  . TRANSTHORACIC ECHOCARDIOGRAM  12/31/2015   dr Caryl Comes   ef 20-254, grade 1 diastolic dysfunction/ mild MR/ septal motion showed abnormal function and dyssynergy  . TRANSURETHRAL RESECTION OF BLADDER  1995  . TRANSURETHRAL RESECTION OF BLADDER TUMOR N/A 12/13/2013   Procedure: TRANSURETHRAL RESECTION OF BLADDER TUMOR (TURBT);  Surgeon: [name], MD;  Location: [facility];  Service: Urology;  Laterality: N/A;  . TRANSURETHRAL RESECTION OF BLADDER TUMOR N/A 01/17/2014   Procedure: TRANSURETHRAL RESECTION OF BLADDER TUMOR (TURBT);  Surgeon: [name], MD;  Location: [facility];  Service: Urology;  Laterality: N/A;  . TRANSURETHRAL RESECTION OF BLADDER TUMOR N/A 04/18/2014   Procedure: TRANSURETHRAL RESECTION OF BLADDER TUMOR (TURBT), CYSTOGRAM;  Surgeon: [name], MD;  Location: [facility];  Service: Urology;  Laterality: N/A;    Family History  Problem Relation Age of Onset  . Cancer Mother   . Cancer Brother     SOCIAL HX: widowed, lives alone. IADL   Current Outpatient Medications:  .  amLODipine (NORVASC) 2.5 MG tablet, TAKE 1 TABLET BY MOUTH DAILY, Disp: 90 tablet, Rfl: 2 .  gabapentin (NEURONTIN) 300 MG capsule, Three times  daily as needed for back pain, Disp: 90 capsule, Rfl: 5 .  HYDROcodone-acetaminophen (NORCO) 10-325 MG tablet, Take 1 tablet by mouth every 4 (four) hours as needed. To fill on or after: 05/31/2018, 06/30/2026, 07/29/2018, Disp: 180 tablet, Rfl: 0 .  losartan (COZAAR) 100 MG tablet, Take 1 tablet (100 mg total) by mouth at bedtime., Disp: 90 tablet, Rfl: 1 .  metoprolol succinate (TOPROL-XL) 25 MG 24 hr tablet, Take 1 tablet (25 mg total) by mouth daily., Disp: 90 tablet, Rfl: 3 .  pantoprazole (PROTONIX) 40 MG tablet, TAKE 1 TABLET(40 MG) BY MOUTH TWICE DAILY, Disp: 180 tablet, Rfl: 1 .  polyethylene glycol (MIRALAX / GLYCOLAX)  packet, Take 17 g by mouth daily as needed for mild constipation. , Disp: , Rfl:  .  promethazine (PHENERGAN) 12.5 MG tablet, Take 1 tablet (12.5 mg total) by mouth every 8 (eight) hours as needed for nausea or vomiting., Disp: 30 tablet, Rfl: 2 .  sucralfate (CARAFATE) 1 g tablet, TAKE 1 TABLET BY MOUTH FOUR TIMES DAILY EVERY NIGHT AT BEDTIME WITH MEALS, Disp: 120 tablet, Rfl: 0 .  Vitamin D, Ergocalciferol, (DRISDOL) 50000 units CAPS capsule, TK ONE C PO Q WEEK FOR 8 WEEKS, Disp: , Rfl: 0 .  rosuvastatin (CRESTOR) 5 MG tablet, Take 1 tablet (5 mg total) by mouth daily., Disp: 30 tablet, Rfl: 5 No current facility-administered medications for this visit.   Facility-Administered Medications Ordered in Other Visits:  .  epirubicin (ELLENCE) 50 mg in sodium chloride 0.9 % bladder instillation, 50 mg, Bladder Instillation, Once, [name], MD  EXAM:  General impression: alert, cooperative and articulate.  No signs of being in distress  Lungs: speech is fluent sentence length suggests that patient is not short of breath and not punctuated by cough, sneezing or sniffing. Marland Kitchen   Psych: affect normal.  speech is articulate and non pressured .  Denies suicidal thoughts   ASSESSMENT AND PLAN:  Discussed the following assessment and plan:  Mixed hyperlipidemia - Plan: Comprehensive metabolic panel  Spinal stenosis in cervical region  Educated About Covid-19 Virus Infection  Pulmonary emphysema, unspecified emphysema type ([place])  PAD (peripheral artery disease) ([place])  Essential hypertension  Hyponatremia  Spinal stenosis in cervical region She is  Now  under treatment by Pain management.  Her pain is not controlled with hydrocodone 4 TIMES DAILY and it apparently did not respond to lumbar facet and median branch nerve block. Past trail of oxycodone was not tolerated. She is awaitinig definitive treatment with surgical decompression vs spinal cord stimulator ,  To be done by [facility] pending 1) cardiology clearance and 2) resolution of restrictions on elective surgeries imposed by government due to the [place] 19 epidemic.   Educated About Covid-19 Virus Infection Reviewed with  patient on the signs and symptoms of COVID-19 infection and ways to avoid the viral infection including washing hands frequently with soap and water,  using hand sanitizer if unable to wash, avoiding touching face,  staying at home and limiting visitors,  and avoiding contact with people coming in and out of home.  Reminded patient to call office with questions/concerns.  The importance of social distancing was emphasized today  COPD (chronic obstructive pulmonary disease) Secondary to 50+ years of tobacco abuse.  Her sedentary lifestyle is currently not limited by her lung disease. She has not had formal PFT's.  No evidence of pulmonary hypertension by Oct 2017 ECHO   PAD (peripheral artery disease) ([place]) She has <60%  stenosis of renal arteries bilaterally noted on RAS doppler done April 2020.  ABI's were done August 2017:  1.25 ON RIGHT NAND 1.18 ON LEFT   Statin therapy strongly advised, and she has agree d to a trial of crestor .  She will return for lfts in 1 month  Hypertension Tolerating change from lisinopril to losartan.  No changes today   Hyponatremia INTERMITTENT and mild (133) ,  Noted in March 2020 at [facility] . Marland Kitchen  Likely due to COPD and decreased oral intake.     I discussed the assessment and treatment plan with the patient. The patient was provided an opportunity to ask questions and all were answered. The patient agreed with the plan and demonstrated an understanding of the instructions.   The patient was advised to call back or seek an in-person evaluation if the symptoms worsen or if the condition fails to improve as anticipated.  I provided 25 minutes of non-face-to-face time during this encounter.   [name], MD

## 2018-07-19 NOTE — Progress Notes (Signed)
Pt has been scheduled for a lab appt in 4 weeks. Pt is aware of appt date and time.

## 2018-07-21 DIAGNOSIS — Z7189 Other specified counseling: Secondary | ICD-10-CM | POA: Insufficient documentation

## 2018-07-21 NOTE — Assessment & Plan Note (Signed)
Reviewed with  patient on the signs and symptoms of COVID-19 infection and ways to avoid the viral infection including washing hands frequently with soap and water,  using hand sanitizer if unable to wash, avoiding touching face,  staying at home and limiting visitors,  and avoiding contact with people coming in and out of home.  Reminded patient to call office with questions/concerns.  The importance of social distancing was emphasized today

## 2018-07-21 NOTE — Assessment & Plan Note (Signed)
INTERMITTENT and mild (133) ,  Noted in March 2020 at [facility] . Marland Kitchen  Likely due to COPD and decreased oral intake.

## 2018-07-21 NOTE — Assessment & Plan Note (Signed)
Tolerating change from lisinopril to losartan.  No changes today

## 2018-07-21 NOTE — Assessment & Plan Note (Addendum)
She has <60% stenosis of renal arteries bilaterally noted on RAS doppler done April 2020.  ABI's were done August 2017:  1.25 ON RIGHT NAND 1.18 ON LEFT   Statin therapy strongly advised, and she has agree d to a trial of crestor .  She will return for lfts in 1 month

## 2018-07-21 NOTE — Assessment & Plan Note (Signed)
She is  Now  under treatment by Pain management.  Her pain is not controlled with hydrocodone 4 TIMES DAILY and it apparently did not respond to lumbar facet and median branch nerve block. Past trail of oxycodone was not tolerated. She is awaitinig definitive treatment with surgical decompression vs spinal cord stimulator ,  To be done by [facility] pending 1) cardiology clearance and 2) resolution of restrictions on elective surgeries imposed by government due to the [place] 19 epidemic.

## 2018-07-21 NOTE — Assessment & Plan Note (Addendum)
Secondary to 50+ years of tobacco abuse.  Her sedentary lifestyle is currently not limited by her lung disease. She has not had formal PFT's.  No evidence of pulmonary hypertension by Oct 2017 ECHO

## 2018-07-31 ENCOUNTER — Ambulatory Visit (INDEPENDENT_AMBULATORY_CARE_PROVIDER_SITE_OTHER): Payer: Medicare Other | Admitting: Gastroenterology

## 2018-07-31 DIAGNOSIS — R110 Nausea: Secondary | ICD-10-CM

## 2018-07-31 NOTE — Progress Notes (Signed)
[name]  [address]  [place]  [place], [place] [address]  Main: [phone]  Fax: [phone]   Gastroenterology Consultation  Referring Provider:     [name], MD Primary Care Physician:  [name], MD Reason for Consultation:     Weight loss and nausea        HPI:   Virtual Visit via video  Note  I connected with patient on 07/31/18 at 10:30 AM EDT by video  and verified that I am speaking with the correct person using two identifiers.   I discussed the limitations, risks, security and privacy concerns of performing an evaluation and management service by video and the availability of in person appointments. I also discussed with the patient that there may be a patient responsible charge related to this service. The patient expressed understanding and agreed to proceed.  Location of the patient: Home Location of provider: Home Participating persons: Patient and provider only   History of Present Illness: Chief Complaint  Patient presents with  . Nausea     [name] is a 78 y.o. y/o female referred for consultation & management  by Dr. [name], MD.    She has been referred for weight loss and nausea. H/o gastric ulcer in 2015.   She says that for the past 4 years on and off has had nausea. She says that her appetite varies, eats well and some times when she does not eat well gets nauseated, she was prescribed some meds for the same. Recalls it was phenargan- used a script for 6 months not daily.She says she can gain weight in a day or loose weight in a day.   She says that she has had stomach ulcers in the past and has been given PPI +sucralfate. Not on any blood thinners or Asprin. Denies use of any NSAID's. On hydrocodone for severe back pain.   Denies any clear weight loss- only states she goes "up and down".   Presently denies any active issues she is concerned that she would like to talk to me about.  Past Medical History:   Diagnosis Date  . 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 Arthritis    "in about all my joints; for sure in my back"  . Bladder cancer [facility]) urologist-  dr Junious Silk   dx 1995--  recurrent bladder cancer 2015 , s/p TURBT's and chemo instillation's  . Chronic hyponatremia   . Chronic lower back pain   . Chronic systolic (congestive) heart failure [facility])    cardiologist-  dr Rogue Jury  . COPD with emphysema ([place])   . Coronary artery disease cardiologist-  dr Kathlyn [place]   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% per cardiac cath 03-03-2014  . DDD (degenerative disc disease), thoracolumbar   . Ectopic atrial beats    2015  alternating BBB  . Frequent urination   . Full dentures   . Gait instability    due to chronic low back pain, uses roller walker  . GERD (gastroesophageal reflux disease)   . History of iron deficiency anemia 11/2013   resolved w/ IV Iron  . History of stomach ulcers 11/2013  . Hypertension   . LBBB (left bundle branch block)   . NICM (nonischemic cardiomyopathy) ([place]) last echo 12-31-2015 ef 55-60%   dx 09/ 2015 per echo 20%;  myoview 09/ 2015  ef 28%;  per cardiac cath 12/ 2015 ef 30%;    . Nocturia more than twice per night   . Scoliosis     Past Surgical History:  Procedure Laterality Date  . BI-VENTRICULAR IMPLANTABLE CARDIOVERTER DEFIBRILLATOR N/A 05/07/2014   Procedure: BI-VENTRICULAR IMPLANTABLE CARDIOVERTER DEFIBRILLATOR  (CRT-D);  Surgeon: Deboraha Sprang, MD;  Location: [facility] CATH LAB;  Service: Cardiovascular;  Laterality: N/A;  . CARDIAC CATHETERIZATION  03-03-2014  dr Kathlyn [place]   [facility]   pLAD 20%, pRCA 20%, dRCA 50%, RPLS 50%;  ef 30%, mild elevated LVEDP, mild gradient across aortic valve LVOT  . CARDIOVASCULAR STRESS TEST  11/25/2013   High risk nuclear study w/ large high severity inferior wall perfusion defect on stress and rest images, large mild severity  anteroseptal wall perfusion defect on stress and rest images, No inducible ischemia/ global moderate hypokinesis, ef 28%  . CATARACT EXTRACTION W/ INTRAOCULAR LENS  IMPLANT, BILATERAL Bilateral right 12-2013 / left  02-2014  . CYSTOSCOPY W/ RETROGRADES Bilateral 01/06/2015   Procedure: CYSTOSCOPY WITH  BLADDER BIOPSY BILATERAL RETROGRADE PYELOGRAM,INSTILLATION OF MITOMYCIN C;  Surgeon: [name], MD;  Location: [facility] ORS;  Service: Urology;  Laterality: Bilateral;  . CYSTOSCOPY WITH BIOPSY N/A 10/06/2015   Procedure: CYSTO WITH BLADDER BIOPSY, FULGERATION, CHEMO IRRIGATION EPIRUBICIN IN PACU;  Surgeon: [name], MD;  Location: [facility];  Service: Urology;  Laterality: N/A;  . CYSTOSCOPY WITH FULGERATION N/A 06/30/2017   Procedure: Marland Kitchen CYSTOSCOPY WITH [place] FULGERATION/ BLADDER BIOPSY/ INSTILLATION OF EPIRUBICIN;  Surgeon: [name], MD;  Location: [facility];  Service: Urology;  Laterality: N/A;  . CYSTOSCOPY WITH RETROGRADE PYELOGRAM, URETEROSCOPY AND STENT PLACEMENT Bilateral 04/18/2014   Procedure: CYSTOSCOPY WITH RETROGRADE PYELOGRAM;  Surgeon: [name], MD;  Location: [facility];  Service: Urology;  Laterality: Bilateral;  . ESOPHAGOGASTRODUODENOSCOPY N/A 11/28/2013   Procedure: ESOPHAGOGASTRODUODENOSCOPY (EGD);  Surgeon: Arta Silence, MD;  Location: [facility] ENDOSCOPY;  Service: Endoscopy;  Laterality: N/A;  . LUMBAR [place]  1980's   "ruptured disc"  . TRANSTHORACIC ECHOCARDIOGRAM  12/31/2015   dr Caryl Comes   ef 76-720, grade 1 diastolic dysfunction/ mild MR/ septal motion showed abnormal function and dyssynergy  . TRANSURETHRAL RESECTION OF BLADDER  1995  . TRANSURETHRAL RESECTION OF BLADDER TUMOR N/A 12/13/2013   Procedure: TRANSURETHRAL RESECTION OF BLADDER TUMOR (TURBT);  Surgeon: [name], MD;  Location: [facility];  Service: Urology;  Laterality: N/A;  . TRANSURETHRAL RESECTION OF BLADDER TUMOR N/A 01/17/2014   Procedure: TRANSURETHRAL  RESECTION OF BLADDER TUMOR (TURBT);  Surgeon: [name], MD;  Location: [facility];  Service: Urology;  Laterality: N/A;  . TRANSURETHRAL RESECTION OF BLADDER TUMOR N/A 04/18/2014   Procedure: TRANSURETHRAL RESECTION OF BLADDER TUMOR (TURBT), CYSTOGRAM;  Surgeon: [name], MD;  Location: [facility];  Service: Urology;  Laterality: N/A;    Prior to Admission medications   Medication Sig Start Date End Date Taking? Authorizing Provider  amLODipine (NORVASC) 2.5 MG tablet TAKE 1 TABLET BY MOUTH DAILY 06/27/18  Yes Deboraha Sprang, MD  gabapentin (NEURONTIN) 300 MG capsule Three times daily as needed for back pain 06/07/18  Yes [name], MD  HYDROcodone-acetaminophen (NORCO) 10-325 MG tablet Take 1 tablet by mouth every 4 (four) hours as needed. To fill on or after: 05/31/2018, 06/30/2026, 07/29/2018 05/31/18  Yes [name], MD  losartan (COZAAR) 100 MG tablet Take 1 tablet (100 mg total) by mouth at bedtime. 07/18/18  Yes [name], MD  metoprolol succinate (TOPROL-XL) 25  MG 24 hr tablet Take 1 tablet (25 mg total) by mouth daily. 02/28/18  Yes Arida, Muammar A, MD  pantoprazole (PROTONIX) 40 MG tablet TAKE 1 TABLET(40 MG) BY MOUTH TWICE DAILY 11/01/17  Yes [name], MD  polyethylene glycol (MIRALAX / GLYCOLAX) packet Take 17 g by mouth daily as needed for mild constipation.    Yes [provider]  promethazine (PHENERGAN) 12.5 MG tablet Take 1 tablet (12.5 mg total) by mouth every 8 (eight) hours as needed for nausea or vomiting. 06/07/18  Yes [name], MD  rosuvastatin (CRESTOR) 5 MG tablet Take 1 tablet (5 mg total) by mouth daily. 07/18/18  Yes [name], MD  sucralfate (CARAFATE) 1 g tablet TAKE 1 TABLET BY MOUTH FOUR TIMES DAILY EVERY NIGHT AT BEDTIME WITH MEALS 06/27/18  Yes [name], MD  Vitamin D, Ergocalciferol, (DRISDOL) 50000 units CAPS capsule TK ONE C PO Q WEEK FOR 8 WEEKS 11/16/17  Yes [provider]    Family History  Problem  Relation Age of Onset  . Cancer Mother   . Cancer Brother      Social History   Tobacco Use  . Smoking status: Current Every Day Smoker    Packs/day: 0.50    Years: 55.00    Pack years: 27.50    Types: Cigarettes  . Smokeless tobacco: Never Used  . Tobacco comment: 06-27-2017 per pt currently smokes 10 cig's per day , started smoking age 89  Substance Use Topics  . Alcohol use: No  . Drug use: No    Allergies as of 07/31/2018  . (No Known Allergies)    Review of Systems:    All systems reviewed and negative except where noted in HPI. General Appearance:    Alert, cooperative, no distress, appears stated age  Head:    Normocephalic, without obvious abnormality, atraumatic  Eyes:    PERRL, conjunctiva/corneas clear,  Ears:    Grossly normal hearing    Neurologic:   Grossly appears normal     Observations/Objective:  Labs: CBC    Component Value Date/Time   WBC 7.9 10/02/2015 1330   RBC 4.39 10/02/2015 1330   HGB 13.3 06/30/2017 0912   HGB 12.1 02/26/2014 1505   HCT 39.0 06/30/2017 0912   HCT 38.2 02/26/2014 1505   PLT 209 10/02/2015 1330   PLT 228 02/26/2014 1505   MCV 92.0 10/02/2015 1330   MCV 89 02/26/2014 1505   MCH 30.8 10/02/2015 1330   MCHC 33.4 10/02/2015 1330   RDW 13.7 10/02/2015 1330   RDW 14.8 04/28/2014 1038   RDW 17.6 (H) 02/26/2014 1505   LYMPHSABS 1.9 04/28/2014 1038   LYMPHSABS 2.0 02/26/2014 1505   MONOABS 0.6 02/26/2014 1505   EOSABS 0.3 04/28/2014 1038   EOSABS 0.1 02/26/2014 1505   BASOSABS 0.0 04/28/2014 1038   BASOSABS 0.0 02/26/2014 1505   CMP     Component Value Date/Time   NA 136 06/30/2017 0912   NA 134 04/28/2014 1013   NA 138 02/26/2014 1505   K 4.2 06/30/2017 0912   K 4.7 02/26/2014 1505   CL 99 03/24/2017 1407   CL 104 02/26/2014 1505   CO2 29 03/24/2017 1407   CO2 29 02/26/2014 1505   GLUCOSE 97 06/30/2017 0912   GLUCOSE 86 02/26/2014 1505   BUN 11 03/24/2017 1407   BUN 12 04/28/2014 1013   BUN 16 02/26/2014  1505   CREATININE 0.73 03/24/2017 1407   CREATININE 0.75 02/26/2014  1505   CALCIUM 9.9 03/24/2017 1407   CALCIUM 9.3 02/26/2014 1505   PROT 6.9 03/24/2017 1407   ALBUMIN 4.4 03/24/2017 1407   AST 25 03/24/2017 1407   ALT 17 03/24/2017 1407   ALKPHOS 105 03/24/2017 1407   BILITOT 0.4 03/24/2017 1407   GFRNONAA >60 10/02/2015 1330   GFRNONAA >60 02/26/2014 1505   GFRAA >60 10/02/2015 1330   GFRAA >60 02/26/2014 1505    Imaging Studies: Vas US Renal Artery Duplex  Result Date: 07/12/2018 ABDOMINAL VISCERAL Indications: Mildly uncontrolled HTN over the past 6 months Comparison Study: none Performing Technologist: Concha Norway RVT  Examination Guidelines: A complete evaluation includes B-mode imaging, spectral Doppler, color Doppler, and power Doppler as needed of all accessible portions of each vessel. Bilateral testing is considered an integral part of a complete examination. Limited examinations for reoccurring indications may be performed as noted.  Duplex Findings: +----------+--------+--------+------+--------+ MesentericPSV cm/sEDV cm/sPlaqueComments +----------+--------+--------+------+--------+ Aorta Mid    42                          +----------+--------+--------+------+--------+  +------------------+--------+--------+-------+ Right Renal ArteryPSV cm/sEDV cm/sComment +------------------+--------+--------+-------+ Proximal            111                   +------------------+--------+--------+-------+ Mid                 111                   +------------------+--------+--------+-------+ Distal              103                   +------------------+--------+--------+-------+ +-----------------+--------+--------+-------+ Left Renal ArteryPSV cm/sEDV cm/sComment +-----------------+--------+--------+-------+ Proximal           123                   +-----------------+--------+--------+-------+ Mid                107                    +-----------------+--------+--------+-------+ Distal              76                   +-----------------+--------+--------+-------+ +------------+--------+--------+----+-----------+--------+--------+----+ Right KidneyPSV cm/sEDV cm/sRI  Left KidneyPSV cm/sEDV cm/sRI   +------------+--------+--------+----+-----------+--------+--------+----+ Upper Pole                      Upper Pole                      +------------+--------+--------+----+-----------+--------+--------+----+ Mid         56      19      0.67Mid        62      18      0.71 +------------+--------+--------+----+-----------+--------+--------+----+ Lower Pole                      Lower Pole                      +------------+--------+--------+----+-----------+--------+--------+----+ Hilar                           Hilar                           +------------+--------+--------+----+-----------+--------+--------+----+ +------------------+----+------------------+----+  Right Kidney          Left Kidney            +------------------+----+------------------+----+ RAR                   RAR                    +------------------+----+------------------+----+ RAR (manual)      2.64RAR (manual)      2.93 +------------------+----+------------------+----+ Cortex                Cortex                 +------------------+----+------------------+----+ Cortex thickness      Corex thickness        +------------------+----+------------------+----+ Kidney length (cm)9.98Kidney length (cm)8.17 +------------------+----+------------------+----+  Summary: Renal:  Right: Normal size right kidney. Normal right Resisitive Index.        Normal cortical thickness of right kidney. RRV flow present.        1-59% stenosis of the right renal artery. Upper limits normal        renal artery flow. Moderate atherosclerosis throughout. Left:  Abnormal size for the left kidney. Normal left Resistive         Index. Normal cortical thickness of the left kidney. LRV flow        present. 1-59% stenosis of the left renal artery. Left kidney        appears low in pelvis and technically difficult to get a        confident size but appears mildly decreased in size compared        to the right kidney. Upper limits velocities in the renal        artery. Moderate atherosclerosis throughout.  *See table(s) above for measurements and observations.  Diagnosing physician: Hortencia Pilar MD  Electronically signed by Hortencia Pilar MD on 07/12/2018 at 4:32:34 PM.    Final     Assessment and Plan:   KEIOSHA CANCRO is a 78 y.o. y/o female has been referred for weight loss and nausea. H/o of gastric ulcer in 2015 , no malignancy. Today she says her weight loss has been stable , she goes up and down in her weight. Nausea on and off for over 6 years which usually resolves with a lqiuid diet, It is possible that the nausea could be a side effect of hydrocodone vs heart failure. She says it is not bothering her at this time and is doing well .   Plan :   1. At this time I see no indication for any procedures especially due to her age, COVID pandemic and her heart failure. If the nausea or weight loss worsens/progresses then we should definitely puruse endoscopy.  2. Advised she can stop Carafate and continue on her PPI which would help treat any silent reflux from gastroparesis secondary to hydrocodone which may also be contributing to her nausea.   F/u PRN   Follow Up Instructions:   I discussed the assessment and treatment plan with the patient. The patient was provided an opportunity to ask questions and all were answered. The patient agreed with the plan and demonstrated an understanding of the instructions.   The patient was advised to call back or seek an in-person evaluation if the symptoms worsen or if the condition fails to improve as anticipated.    Dr Bianca Bellows MD,MRCP [facility]) Gastroenterology/Hepatology  Pager: [phone]   Speech recognition software was used to  dictate the above note.

## 2018-08-14 ENCOUNTER — Other Ambulatory Visit: Payer: Medicare Other

## 2018-08-15 ENCOUNTER — Encounter: Payer: Self-pay | Admitting: Student in an Organized Health Care Education/Training Program

## 2018-08-16 ENCOUNTER — Encounter: Payer: Self-pay | Admitting: Student in an Organized Health Care Education/Training Program

## 2018-08-16 ENCOUNTER — Other Ambulatory Visit: Payer: Self-pay

## 2018-08-16 ENCOUNTER — Ambulatory Visit: Payer: Medicare Other | Admitting: Gastroenterology

## 2018-08-16 ENCOUNTER — Ambulatory Visit
Payer: Medicare Other | Attending: Student in an Organized Health Care Education/Training Program | Admitting: Student in an Organized Health Care Education/Training Program

## 2018-08-16 DIAGNOSIS — M5416 Radiculopathy, lumbar region: Secondary | ICD-10-CM | POA: Diagnosis not present

## 2018-08-16 DIAGNOSIS — M48062 Spinal stenosis, lumbar region with neurogenic claudication: Secondary | ICD-10-CM

## 2018-08-16 DIAGNOSIS — M5136 Other intervertebral disc degeneration, lumbar region: Secondary | ICD-10-CM

## 2018-08-16 DIAGNOSIS — G894 Chronic pain syndrome: Secondary | ICD-10-CM | POA: Diagnosis not present

## 2018-08-16 DIAGNOSIS — M47816 Spondylosis without myelopathy or radiculopathy, lumbar region: Secondary | ICD-10-CM

## 2018-08-16 DIAGNOSIS — M51369 Other intervertebral disc degeneration, lumbar region without mention of lumbar back pain or lower extremity pain: Secondary | ICD-10-CM

## 2018-08-16 MED ORDER — HYDROCODONE-ACETAMINOPHEN 10-325 MG PO TABS: 1.0000 | ORAL_TABLET | ORAL | 0 refills | Status: DC | PRN

## 2018-08-16 MED ORDER — HYDROCODONE-ACETAMINOPHEN 10-325 MG PO TABS: 1.0000 | ORAL_TABLET | ORAL | 0 refills | Status: AC | PRN

## 2018-08-16 NOTE — Progress Notes (Signed)
Pain Management Virtual Encounter Note - Virtual Visit via Telephone Telehealth (real-time audio visits between healthcare provider and patient).  Patient's Phone No. & Preferred Pharmacy:  [phone] (home); [phone] (mobile); (Preferred) [phone] [id number] [name] [place] [address] Phone: [phone] Fax: [phone]   Pre-screening note:  Our staff contacted Ms. Kenagy and offered her an "in person", "face-to-face" appointment versus a telephone encounter. She indicated preferring the telephone encounter, at this time.  Reason for Virtual Visit: COVID-19*  Social distancing based on CDC and AMA recommendations.   I contacted [name] on 08/16/2018 at 2:42 PM via telephone.      I clearly identified myself as [name], MD. I verified that I was speaking with the correct person using two identifiers (Name and date of birth: 15-Aug-1940).  Advanced Informed Consent I sought verbal advanced consent from [name] for virtual visit interactions. I informed Ms. Juneau of possible security and privacy concerns, risks, and limitations associated with providing "not-in-person" medical evaluation and management services. I also informed Ms. Agostinelli of the availability of "in-person" appointments. Finally, I informed her that there would be a charge for the virtual visit and that she could be  personally, fully or partially, financially responsible for it. Ms. Whitmyer expressed understanding and agreed to proceed.   Historic Elements   Ms. MADINE SARR is a 78 y.o. year old, female patient evaluated today after her last encounter by our practice on 05/08/2018. Ms. Ganesh  has a past medical history of AICD (automatic cardioverter/defibrillator) present (placement 05/07/2014 medtronic    ), Arthritis, Bladder cancer ([place]) (urologist-  dr Junious Silk), Chronic  hyponatremia, Chronic lower back pain, Chronic systolic (congestive) heart failure ([place]), COPD with emphysema ([place]), Coronary artery disease (cardiologist-  dr Kathlyn Sacramento), DDD (degenerative disc disease), thoracolumbar, Ectopic atrial beats, Frequent urination, Full dentures, Gait instability, GERD (gastroesophageal reflux disease), History of iron deficiency anemia (11/2013), History of stomach ulcers (11/2013), Hypertension, LBBB (left bundle branch block), NICM (nonischemic cardiomyopathy) ([place]) (last echo 12-31-2015 ef 55-60%), Nocturia more than twice per night, and Scoliosis. She also  has a past surgical history that includes Esophagogastroduodenoscopy (N/A, 11/28/2013); Transurethral resection of bladder tumor (N/A, 12/13/2013); Transurethral resection of bladder tumor (N/A, 01/17/2014); Cataract extraction w/ intraocular lens  implant, bilateral (Bilateral, right 12-2013 / left  02-2014); Cystoscopy with retrograde pyelogram, ureteroscopy and stent placement (Bilateral, 04/18/2014); Transurethral resection of bladder tumor (N/A, 04/18/2014); Lumbar disc surgery (1980's); bi-ventricular implantable cardioverter defibrillator (N/A, 05/07/2014); Cystoscopy w/ retrogrades (Bilateral, 01/06/2015); Cystoscopy with biopsy (N/A, 10/06/2015); Cardiac catheterization (03-03-2014  dr Kathlyn [place]   [facility]); Transurethral resection of bladder (1995); Cardiovascular stress test (11/25/2013); transthoracic echocardiogram (12/31/2015   dr Caryl Comes); and Cystoscopy with fulgeration (N/A, 06/30/2017). Ms. Kirkland has a current medication list which includes the following prescription(s): amlodipine, gabapentin, hydrocodone-acetaminophen, losartan, metoprolol succinate, pantoprazole, polyethylene glycol, promethazine, vitamin d (ergocalciferol), hydrocodone-acetaminophen, and hydrocodone-acetaminophen, and the following Facility-Administered Medications: epirubicin (ELLENCE) 50 mg in sodium chloride 0.9 % bladder instillation. She   reports that she has been smoking cigarettes. She has a 27.50 pack-year smoking history. She has never used smokeless tobacco. She reports that she does not drink alcohol or use drugs. Ms. Kannan has No Known Allergies.   HPI  I last communicated with her on 05/08/2018. Today, she is being contacted for medication management.   No significant changes in medical history  other than endorsement that her low back and leg pain is getting much worse.  Patient has been evaluated by Dr. Mearl Latin with [facility] neurosurgery and plan is to proceed with the radical lumbar spinal cord stimulator trial.  This is been placed on hold given COVID-19 restrictions for elective procedures.  Patient hopes that she will be able to have trial done in the next couple of weeks.  I informed the patient that it is in her best interest to try and decrease her hydrocodone intake prior to the trial to better assess what sort of pain relief she obtains from it.  I do not know how realistic this will be as the patient is in severe and debilitating pain and usually relies on hydrocodone every 3-4 hours to help manage her lumbar pain.  Patient also takes gabapentin at night which she states helps a finds that daytime dosing makes her too sleepy and drowsy and affects her balance making it unsafe to perform ADLs.  I informed the patient to limit her daytime intake and to only take it as night as she is doing.  Pharmacotherapy Assessment   08/01/2018  2   05/08/2018  Hydrocodone-Acetamin 10-325 MG  180.00 30 Bi Lat   [id number]   Wal ([id number])   0  60.00 MME  Comm Ins   [place]     Monitoring: Pharmacotherapy: No side-effects or adverse reactions reported. [place] PMP: PDMP reviewed during this encounter.       Compliance: No problems identified. Effectiveness: Clinically acceptable. Plan: Refer to "POC".  Pertinent Labs  Renal Function Lab Results  Component Value Date   BUN 11 03/24/2017   CREATININE 0.73 03/24/2017   BCR 18 04/28/2014   GFRAA >60  10/02/2015   GFRNONAA >60 10/02/2015   Hepatic Function Lab Results  Component Value Date   AST 25 03/24/2017   ALT 17 03/24/2017   ALBUMIN 4.4 03/24/2017   UDS Summary  Date Value Ref Range Status  02/06/2018 FINAL  Final    Comment:    ==================================================================== TOXASSURE SELECT 13 (MW) ==================================================================== Test                             Result       Flag       Units Drug Present   Hydrocodone                    3616                    ng/mg creat   Hydromorphone                  1604                    ng/mg creat   Dihydrocodeine                 328                     ng/mg creat   Norhydrocodone                 5344                    ng/mg creat    Sources of hydrocodone include scheduled prescription    medications. Hydromorphone, dihydrocodeine and norhydrocodone are    expected metabolites of hydrocodone. Hydromorphone and  dihydrocodeine are also available as scheduled prescription    medications. ==================================================================== Test                      Result    Flag   Units      Ref Range   Creatinine              25               mg/dL      >=20 ==================================================================== Declared Medications:  Medication list was not provided. ==================================================================== For clinical consultation, please call [phone]. ====================================================================    Note: Above Lab results reviewed.  Recent imaging  VAS US RENAL ARTERY DUPLEX ABDOMINAL VISCERAL  Indications: Mildly uncontrolled HTN over the past 6 months  Comparison Study: none  Performing Technologist: Concha Norway RVT    Examination Guidelines: A complete evaluation includes B-mode imaging, spectral Doppler, color Doppler, and power Doppler as needed of  all accessible portions of each vessel. Bilateral testing is considered an integral part of a complete examination. Limited examinations for reoccurring indications may be performed as noted.    Duplex Findings: +----------+--------+--------+------+--------+ MesentericPSV cm/sEDV cm/sPlaqueComments +----------+--------+--------+------+--------+ Aorta Mid    42                          +----------+--------+--------+------+--------+    +------------------+--------+--------+-------+ Right Renal ArteryPSV cm/sEDV cm/sComment +------------------+--------+--------+-------+ Proximal            111                   +------------------+--------+--------+-------+ Mid                 111                   +------------------+--------+--------+-------+ Distal              103                   +------------------+--------+--------+-------+  +-----------------+--------+--------+-------+ Left Renal ArteryPSV cm/sEDV cm/sComment +-----------------+--------+--------+-------+ Proximal           123                   +-----------------+--------+--------+-------+ Mid                107                   +-----------------+--------+--------+-------+ Distal              76                   +-----------------+--------+--------+-------+  +------------+--------+--------+----+-----------+--------+--------+----+ Right KidneyPSV cm/sEDV cm/sRI  Left KidneyPSV cm/sEDV cm/sRI   +------------+--------+--------+----+-----------+--------+--------+----+ Upper Pole                      Upper Pole                      +------------+--------+--------+----+-----------+--------+--------+----+ Mid         56      19      0.67Mid        62      18      0.71 +------------+--------+--------+----+-----------+--------+--------+----+ Lower Pole                      Lower Pole                       +------------+--------+--------+----+-----------+--------+--------+----+  Hilar                           Hilar                           +------------+--------+--------+----+-----------+--------+--------+----+  +------------------+----+------------------+----+ Right Kidney          Left Kidney            +------------------+----+------------------+----+ RAR                   RAR                    +------------------+----+------------------+----+ RAR (manual)      2.64RAR (manual)      2.93 +------------------+----+------------------+----+ Cortex                Cortex                 +------------------+----+------------------+----+ Cortex thickness      Corex thickness        +------------------+----+------------------+----+ Kidney length (cm)9.98Kidney length (cm)8.17 +------------------+----+------------------+----+    Summary: Renal:   Right: Normal size right kidney. Normal right Resisitive Index.        Normal cortical thickness of right kidney. RRV flow present.        1-59% stenosis of the right renal artery. Upper limits normal        renal artery flow. Moderate atherosclerosis throughout. Left:  Abnormal size for the left kidney. Normal left Resistive        Index. Normal cortical thickness of the left kidney. LRV flow        present. 1-59% stenosis of the left renal artery. Left kidney        appears low in pelvis and technically difficult to get a        confident size but appears mildly decreased in size compared        to the right kidney. Upper limits velocities in the renal        artery. Moderate atherosclerosis throughout.   *See table(s) above for measurements and observations.   Diagnosing physician: Hortencia Pilar MD   Electronically signed by Hortencia Pilar MD on 07/12/2018 at 4:32:34 PM.       Final    Assessment  The primary encounter diagnosis was Chronic pain syndrome. Diagnoses of Lumbar radiculopathy,  Lumbar degenerative disc disease, Spinal stenosis, lumbar region, with neurogenic claudication, Lumbar facet arthropathy, and Lumbar spondylosis were also pertinent to this visit.  Plan of Care  I have discontinued Tamira C. Mcclenahan's sucralfate and rosuvastatin. I have also changed her HYDROcodone-acetaminophen. Additionally, I am having her start on HYDROcodone-acetaminophen and HYDROcodone-acetaminophen. Lastly, I am having her maintain her polyethylene glycol, pantoprazole, Vitamin D (Ergocalciferol), metoprolol succinate, gabapentin, promethazine, amLODipine, and losartan.  Continue gabapentin as prescribed nightly as needed.  Avoid daytime gabapentin intake due to drowsiness and balance issues that she has experienced before with its daytime intake.  Pharmacotherapy (Medications Ordered): Meds ordered this encounter  Medications  . HYDROcodone-acetaminophen (NORCO) 10-325 MG tablet    Sig: Take 1 tablet by mouth every 4 (four) hours as needed for up to 30 days. For chronic pain. [place] STOP ACT - NA    Dispense:  180 tablet    Refill:  0  . HYDROcodone-acetaminophen (NORCO) 10-325 MG tablet    Sig: Take 1 tablet by mouth every 4 (four) hours as needed for up  to 30 days for severe pain. Must last 30 days.    Dispense:  180 tablet    Refill:  0    Chronic Pain. (STOP Act - Not applicable). Fill one day early if closed on scheduled refill date.  Marland Kitchen HYDROcodone-acetaminophen (NORCO) 10-325 MG tablet    Sig: Take 1 tablet by mouth every 4 (four) hours as needed for up to 30 days for severe pain. Must last 30 days.    Dispense:  180 tablet    Refill:  0    Chronic Pain. (STOP Act - Not applicable). Fill one day early if closed on scheduled refill date.   Orders:  No orders of the defined types were placed in this encounter.  Follow-up plan:   Return in about 3 months (around 11/16/2018) for Medication Management.    I discussed the assessment and treatment plan with the patient. The patient was  provided an opportunity to ask questions and all were answered. The patient agreed with the plan and demonstrated an understanding of the instructions.  Patient advised to call back or seek an in-person evaluation if the symptoms or condition worsens.  Total duration of non-face-to-face encounter: 43minutes.  Note by: [name], MD Date: 08/16/2018; Time: 2:42 PM  Note: This dictation was prepared with Dragon dictation. Any transcriptional errors that may result from this process are unintentional.  Disclaimer:  * Given the special circumstances of the COVID-19 pandemic, the federal government has announced that the Office for Civil Rights (OCR) will exercise its enforcement discretion and will not impose penalties on physicians using telehealth in the event of noncompliance with regulatory requirements under the [place] (HIPAA) in connection with the good faith provision of telehealth during the QQIWL-79 national public health emergency. ([place])

## 2018-08-23 ENCOUNTER — Other Ambulatory Visit: Payer: Medicare Other

## 2018-08-27 DIAGNOSIS — Z780 Asymptomatic menopausal state: Secondary | ICD-10-CM | POA: Diagnosis not present

## 2018-08-27 DIAGNOSIS — M810 Age-related osteoporosis without current pathological fracture: Secondary | ICD-10-CM | POA: Diagnosis not present

## 2018-08-28 ENCOUNTER — Ambulatory Visit (INDEPENDENT_AMBULATORY_CARE_PROVIDER_SITE_OTHER): Payer: Medicare Other | Admitting: *Deleted

## 2018-08-28 ENCOUNTER — Telehealth: Payer: Self-pay | Admitting: Internal Medicine

## 2018-08-28 ENCOUNTER — Encounter: Payer: Self-pay | Admitting: Internal Medicine

## 2018-08-28 DIAGNOSIS — I5022 Chronic systolic (congestive) heart failure: Secondary | ICD-10-CM

## 2018-08-28 DIAGNOSIS — M810 Age-related osteoporosis without current pathological fracture: Secondary | ICD-10-CM | POA: Insufficient documentation

## 2018-08-28 DIAGNOSIS — I428 Other cardiomyopathies: Secondary | ICD-10-CM

## 2018-08-28 LAB — CUP PACEART REMOTE DEVICE CHECK
Battery Remaining Longevity: 27 mo
Battery Voltage: 2.93 V
Brady Statistic AP VP Percent: 49.84 %
Brady Statistic AP VS Percent: 0.91 %
Brady Statistic AS VP Percent: 47.74 %
Brady Statistic AS VS Percent: 1.51 %
Brady Statistic RA Percent Paced: 48.76 %
Brady Statistic RV Percent Paced: 89.85 %
Date Time Interrogation Session: 20200609073524
HighPow Impedance: 78 Ohm
Implantable Lead Implant Date: 20160217
Implantable Lead Implant Date: 20160217
Implantable Lead Implant Date: 20160217
Implantable Lead Location: 753858
Implantable Lead Location: 753859
Implantable Lead Location: 753860
Implantable Lead Model: 4298
Implantable Lead Model: 5076
Implantable Pulse Generator Implant Date: 20160217
Lead Channel Impedance Value: 361 Ohm
Lead Channel Impedance Value: 361 Ohm
Lead Channel Impedance Value: 361 Ohm
Lead Channel Impedance Value: 399 Ohm
Lead Channel Impedance Value: 399 Ohm
Lead Channel Impedance Value: 418 Ohm
Lead Channel Impedance Value: 475 Ohm
Lead Channel Impedance Value: 475 Ohm
Lead Channel Impedance Value: 589 Ohm
Lead Channel Impedance Value: 608 Ohm
Lead Channel Impedance Value: 703 Ohm
Lead Channel Impedance Value: 703 Ohm
Lead Channel Impedance Value: 760 Ohm
Lead Channel Pacing Threshold Amplitude: 0.5 V
Lead Channel Pacing Threshold Amplitude: 1 V
Lead Channel Pacing Threshold Amplitude: 1 V
Lead Channel Pacing Threshold Pulse Width: 0.4 ms
Lead Channel Pacing Threshold Pulse Width: 0.4 ms
Lead Channel Pacing Threshold Pulse Width: 0.4 ms
Lead Channel Sensing Intrinsic Amplitude: 25.125 mV
Lead Channel Sensing Intrinsic Amplitude: 25.125 mV
Lead Channel Sensing Intrinsic Amplitude: 3.375 mV
Lead Channel Sensing Intrinsic Amplitude: 3.375 mV
Lead Channel Setting Pacing Amplitude: 2 V
Lead Channel Setting Pacing Amplitude: 2 V
Lead Channel Setting Pacing Amplitude: 2.5 V
Lead Channel Setting Pacing Pulse Width: 0.4 ms
Lead Channel Setting Pacing Pulse Width: 0.4 ms
Lead Channel Setting Sensing Sensitivity: 0.3 mV

## 2018-08-28 NOTE — Telephone Encounter (Signed)
Bone Density scores received, she has bone loss in the hip which is very bad and diagositc of severe osteoporosis . I recommend treating with medication but would need to discuss the pros and cons in an office visit continue calcium 1200 to 1800 mg daily through diet and supplements ,  2000 units of vitamin d and weight bearing exercise on a regular basis, and make appt to discuss

## 2018-08-29 ENCOUNTER — Telehealth: Payer: Self-pay

## 2018-08-29 NOTE — Telephone Encounter (Signed)
Called and spoke with the patient and informed her of her bone density results, pt understood and she scheduled with me her appt to discuss the medication for bone density.  Patient also states she would like for her results to go to Dr. Mearl Latin of [facility] because she is having back surgery with him in the near future.  Gwendy Boeder,cma

## 2018-08-29 NOTE — Telephone Encounter (Signed)
The patient needed her Bone density results sent to Dr. Leonia Corona at [facility], I faxed a cover sheet  to [facility] imaging today,  and received confirmation that it was received giving them the pateints name and dob and the surgeons information for the results to be sent due to a medical clearance for surgery.  Nina,cma

## 2018-08-29 NOTE — Telephone Encounter (Signed)
You can print it and sent to Dr of her choice

## 2018-08-31 ENCOUNTER — Other Ambulatory Visit: Payer: Self-pay

## 2018-08-31 ENCOUNTER — Ambulatory Visit (INDEPENDENT_AMBULATORY_CARE_PROVIDER_SITE_OTHER): Payer: Medicare Other

## 2018-08-31 DIAGNOSIS — I428 Other cardiomyopathies: Secondary | ICD-10-CM | POA: Diagnosis not present

## 2018-08-31 DIAGNOSIS — I5022 Chronic systolic (congestive) heart failure: Secondary | ICD-10-CM

## 2018-08-31 NOTE — Telephone Encounter (Signed)
Faxed the bone density results to Dr. Rosine Door at [facility], received confirmation that fax was done.  Nashly Olsson,cma

## 2018-09-06 NOTE — Progress Notes (Signed)
Remote ICD transmission.   

## 2018-09-10 DIAGNOSIS — R350 Frequency of micturition: Secondary | ICD-10-CM | POA: Diagnosis not present

## 2018-09-10 DIAGNOSIS — Z8551 Personal history of malignant neoplasm of bladder: Secondary | ICD-10-CM | POA: Diagnosis not present

## 2018-09-11 ENCOUNTER — Telehealth: Payer: Self-pay | Admitting: *Deleted

## 2018-09-11 ENCOUNTER — Ambulatory Visit (INDEPENDENT_AMBULATORY_CARE_PROVIDER_SITE_OTHER): Payer: Medicare Other | Admitting: Internal Medicine

## 2018-09-11 ENCOUNTER — Other Ambulatory Visit: Payer: Self-pay

## 2018-09-11 ENCOUNTER — Encounter: Payer: Self-pay | Admitting: Internal Medicine

## 2018-09-11 DIAGNOSIS — M810 Age-related osteoporosis without current pathological fracture: Secondary | ICD-10-CM | POA: Diagnosis not present

## 2018-09-11 MED ORDER — DENOSUMAB 60 MG/ML ~~LOC~~ SOSY: 60.0000 mg | PREFILLED_SYRINGE | Freq: Once | SUBCUTANEOUS | 0 refills | Status: AC

## 2018-09-11 NOTE — Assessment & Plan Note (Signed)
Discussed treatment options,  Calcium and Vit d requirements.  Prolia advised given her age and contraindication to fosomax given her age

## 2018-09-11 NOTE — Progress Notes (Signed)
Telephone Note  This visit type was conducted due to national recommendations for restrictions regarding the COVID-19 pandemic (e.g. social distancing).  This format is felt to be most appropriate for this patient at this time.  All issues noted in this document were discussed and addressed.  No physical exam was performed (except for noted visual exam findings with Video Visits).   I connected with@ on 09/11/18 at  2:30 PM EDT by a video enabled telemedicine application or telephone and verified that I am speaking with the correct person using two identifiers. Location patient: home Location provider: work or home office Persons participating in the virtual visit: patient, provider  I discussed the limitations, risks, security and privacy concerns of performing an evaluation and management service by telephone and the availability of in person appointments. I also discussed with the patient that there may be a patient responsible charge related to this service. The patient expressed understanding and agreed to proceed.  Reason for visit: discussion of treatment for osteoporosis   HPI:  78 yr old with severe osteoporosis .  No prior therapy  . She currently smokes,  Leads a sedentary life due to  chronic back pain.Using a walker.    ROS: See pertinent positives and negatives per HPI.  Past Medical History:  Diagnosis Date  . 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 Arthritis    "in about all my joints; for sure in my back"  . Bladder cancer [facility]) urologist-  dr Junious Silk   dx 1995--  recurrent bladder cancer 2015 , s/p TURBT's and chemo instillation's  . Chronic hyponatremia   . Chronic lower back pain   . Chronic systolic (congestive) heart failure [facility])    cardiologist-  dr Rogue Jury  . COPD with emphysema ([place])   . Coronary artery disease cardiologist-  dr Kathlyn [place]   Non-obstructive  CAD and ef 30% per cardiac cath 03-03-2014  . DDD (degenerative disc disease), thoracolumbar   . Ectopic atrial beats    2015  alternating BBB  . Frequent urination   . Full dentures   . Gait instability    due to chronic low back pain, uses roller walker  . GERD (gastroesophageal reflux disease)   . History of iron deficiency anemia 11/2013   resolved w/ IV Iron  . History of stomach ulcers 11/2013  . Hypertension   . LBBB (left bundle branch block)   . NICM (nonischemic cardiomyopathy) ([place]) last echo 12-31-2015 ef 55-60%   dx 09/ 2015 per echo 20%;  myoview 09/ 2015 ef 28%;  per cardiac cath 12/ 2015 ef 30%;    . Nocturia more than twice per night   . Scoliosis     Past Surgical History:  Procedure Laterality Date  . BI-VENTRICULAR IMPLANTABLE CARDIOVERTER DEFIBRILLATOR N/A 05/07/2014   Procedure: BI-VENTRICULAR IMPLANTABLE CARDIOVERTER DEFIBRILLATOR  (CRT-D);  Surgeon: Deboraha Sprang, MD;  Location: [facility] CATH LAB;  Service: Cardiovascular;  Laterality: N/A;  . CARDIAC CATHETERIZATION  03-03-2014  dr Kathlyn [place]   [facility]   pLAD 20%, pRCA 20%, dRCA 50%, RPLS 50%;  ef 30%, mild elevated LVEDP, mild gradient across aortic valve LVOT  . CARDIOVASCULAR STRESS TEST  11/25/2013   High risk nuclear study w/ large high severity inferior wall perfusion defect on stress and rest images, large mild severity anteroseptal wall perfusion defect on stress and rest images, No inducible ischemia/ global moderate  hypokinesis, ef 28%  . CATARACT EXTRACTION W/ INTRAOCULAR LENS  IMPLANT, BILATERAL Bilateral right 12-2013 / left  02-2014  . CYSTOSCOPY W/ RETROGRADES Bilateral 01/06/2015   Procedure: CYSTOSCOPY WITH  BLADDER BIOPSY BILATERAL RETROGRADE PYELOGRAM,INSTILLATION OF MITOMYCIN C;  Surgeon: [name], MD;  Location[facility];  Service: Urology;  Laterality: Bilateral;  . CYSTOSCOPY WITH BIOPSY N/A 10/06/2015   Procedure: CYSTO WITH BLADDER BIOPSY, FULGERATION, CHEMO IRRIGATION EPIRUBICIN IN  PACU;  Surgeon: [name], MD;  Location: [facility];  Service: Urology;  Laterality: N/A;  . CYSTOSCOPY WITH FULGERATION N/A 06/30/2017   Procedure: Marland Kitchen CYSTOSCOPY WITH [place] FULGERATION/ BLADDER BIOPSY/ INSTILLATION OF EPIRUBICIN;  Surgeon: [name], MD;  Location[facility];  Service: Urology;  Laterality: N/A;  . CYSTOSCOPY WITH RETROGRADE PYELOGRAM, URETEROSCOPY AND STENT PLACEMENT Bilateral 04/18/2014   Procedure: CYSTOSCOPY WITH RETROGRADE PYELOGRAM;  Surgeon: [name], MD;  Location[facility];  Service: Urology;  Laterality: Bilateral;  . ESOPHAGOGASTRODUODENOSCOPY N/A 11/28/2013   Procedure: ESOPHAGOGASTRODUODENOSCOPY (EGD);  Surgeon: Arta Silence, MD;  Location: [facility] ENDOSCOPY;  Service: Endoscopy;  Laterality: N/A;  . LUMBAR [place]  1980's   "ruptured disc"  . TRANSTHORACIC ECHOCARDIOGRAM  12/31/2015   dr Caryl Comes   ef 52-778, grade 1 diastolic dysfunction/ mild MR/ septal motion showed abnormal function and dyssynergy  . TRANSURETHRAL RESECTION OF BLADDER  1995  . TRANSURETHRAL RESECTION OF BLADDER TUMOR N/A 12/13/2013   Procedure: TRANSURETHRAL RESECTION OF BLADDER TUMOR (TURBT);  Surgeon: [name], MD;  [facility];  Service: Urology;  Laterality: N/A;  . TRANSURETHRAL RESECTION OF BLADDER TUMOR N/A 01/17/2014   Procedure: TRANSURETHRAL RESECTION OF BLADDER TUMOR (TURBT);  Surgeon: [name], MD;  [facility];  Service: Urology;  Laterality: N/A;  . TRANSURETHRAL RESECTION OF BLADDER TUMOR N/A 04/18/2014   Procedure: TRANSURETHRAL RESECTION OF BLADDER TUMOR (TURBT), CYSTOGRAM;  Surgeon: [name], MD;  [facility];  Service: Urology;  Laterality: N/A;    Family History  Problem Relation Age of Onset  . Cancer Mother   . Cancer Brother     SOCIAL HX:  reports that she has been smoking cigarettes. She has a 27.50 pack-year smoking history. She has never used smokeless tobacco. She reports that she  does not drink alcohol or use drugs.   Current Outpatient Medications:  .  amLODipine (NORVASC) 2.5 MG tablet, TAKE 1 TABLET BY MOUTH DAILY, Disp: 90 tablet, Rfl: 2 .  gabapentin (NEURONTIN) 300 MG capsule, Three times daily as needed for back pain, Disp: 90 capsule, Rfl: 5 .  HYDROcodone-acetaminophen (NORCO) 10-325 MG tablet, Take 1 tablet by mouth every 4 (four) hours as needed for up to 30 days. For chronic pain. [place] STOP ACT - NA, Disp: 180 tablet, Rfl: 0 .  [START ON 09/29/2018] HYDROcodone-acetaminophen (NORCO) 10-325 MG tablet, Take 1 tablet by mouth every 4 (four) hours as needed for up to 30 days for severe pain. Must last 30 days., Disp: 180 tablet, Rfl: 0 .  [START ON 10/29/2018] HYDROcodone-acetaminophen (NORCO) 10-325 MG tablet, Take 1 tablet by mouth every 4 (four) hours as needed for up to 30 days for severe pain. Must last 30 days., Disp: 180 tablet, Rfl: 0 .  losartan (COZAAR) 100 MG tablet, Take 1 tablet (100 mg total) by mouth at bedtime., Disp: 90 tablet, Rfl: 1 .  metoprolol succinate (TOPROL-XL) 25 MG 24 hr tablet, Take 1 tablet (25 mg total) by mouth daily., Disp: 90 tablet, Rfl: 3 .  pantoprazole (PROTONIX)  40 MG tablet, TAKE 1 TABLET(40 MG) BY MOUTH TWICE DAILY (Patient taking differently: Take 40 mg by mouth daily. TAKE 1 TABLET(40 MG) AT BEDTIME.), Disp: 180 tablet, Rfl: 1 .  polyethylene glycol (MIRALAX / GLYCOLAX) packet, Take 17 g by mouth daily as needed for mild constipation. , Disp: , Rfl:  .  promethazine (PHENERGAN) 12.5 MG tablet, Take 1 tablet (12.5 mg total) by mouth every 8 (eight) hours as needed for nausea or vomiting., Disp: 30 tablet, Rfl: 2 .  Vitamin D, Ergocalciferol, (DRISDOL) 50000 units CAPS capsule, TK ONE C PO Q WEEK FOR 8 WEEKS, Disp: , Rfl: 0 .  denosumab (PROLIA) 60 MG/ML SOSY injection, Inject 60 mg into the skin once for 1 dose., Disp: 1 mL, Rfl: 0 No current facility-administered medications for this visit.   Facility-Administered Medications  Ordered in Other Visits:  .  epirubicin (ELLENCE) 50 mg in sodium chloride 0.9 % bladder instillation, 50 mg, Bladder Instillation, Once, [name], MD  EXAM:  VITALS per patient if applicable:  GENERAL: alert, oriented, appears well and in no acute distress  HEENT: atraumatic, conjunttiva clear, no obvious abnormalities on inspection of external nose and ears  NECK: normal movements of the head and neck  LUNGS: on inspection no signs of respiratory distress, breathing rate appears normal, no obvious gross SOB, gasping or wheezing  CV: no obvious cyanosis  MS: moves all visible extremities without noticeable abnormality  PSYCH/NEURO: pleasant and cooperative, no obvious depression or anxiety, speech and thought processing grossly intact  ASSESSMENT AND PLAN:  Discussed the following assessment and plan:  Age-related osteoporosis without current pathological fracture - Plan: VITAMIN D 25 Hydroxy (Vit-D Deficiency, Fractures), Comprehensive metabolic panel,   Osteoporosis Discussed treatment options,  Calcium and Vit d requirements.  Prolia advised given her age and contraindication to fosomax given her age    I discussed the assessment and treatment plan with the patient. The patient was provided an opportunity to ask questions and all were answered. The patient agreed with the plan and demonstrated an understanding of the instructions.   The patient was advised to call back or seek an in-person evaluation if the symptoms worsen or if the condition fails to improve as anticipated.  I provided 22 minutes of non-face-to-face time during this encounter.   [name], MD

## 2018-09-13 ENCOUNTER — Other Ambulatory Visit: Payer: Self-pay

## 2018-09-13 ENCOUNTER — Ambulatory Visit (INDEPENDENT_AMBULATORY_CARE_PROVIDER_SITE_OTHER): Payer: Medicare Other | Admitting: Internal Medicine

## 2018-09-13 DIAGNOSIS — M810 Age-related osteoporosis without current pathological fracture: Secondary | ICD-10-CM

## 2018-09-13 DIAGNOSIS — E782 Mixed hyperlipidemia: Secondary | ICD-10-CM | POA: Diagnosis not present

## 2018-09-13 NOTE — Progress Notes (Signed)
Telephone  Note  This visit type was conducted due to national recommendations for restrictions regarding the COVID-19 pandemic (e.g. social distancing).  This format is felt to be most appropriate for this patient at this time.  All issues noted in this document were discussed and addressed.  No physical exam was performed (except for noted visual exam findings with Video Visits).   I connected with@ on 09/13/18 at 12:00 PM EDT by  telephone and verified that I am speaking with the correct person using two identifiers. Location patient: home Location provider: work or home office Persons participating in the virtual visit: patient, provider  I discussed the limitations, risks, security and privacy concerns of performing an evaluation and management service by telephone and the availability of in person appointments. I also discussed with the patient that there may be a patient responsible charge related to this service. The patient expressed understanding and agreed to proceed.   Reason for visit:  Follow up on osteoporosis  and back pain   HPI:  78 YR OLD FEMALE with history of ongoing tobacco  Abuse,  Chronic low back pain secondary to spinal stenosis  Presents for discussion of recent DEXA results and available treatment options  .  She is awaiting notification of her long awaited neurosurgery stimulator implantation  date , and after 25 minutes of discussion our call was prematurely ended by her receipt of a phone call from the surgery scheduler   She has no prior history of treatment for osteoporosis,  And no history of pathologic fractures .  She has ongoing tobacco abuse and documented CAD and COPD   ROS: See pertinent positives and negatives per HPI.  Past Medical History:  Diagnosis Date  . 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 Arthritis    "in about all my joints; for sure in my  back"  . Bladder cancer [facility]) urologist-  dr Junious Silk   dx 1995--  recurrent bladder cancer 2015 , s/p TURBT's and chemo instillation's  . Chronic hyponatremia   . Chronic lower back pain   . Chronic systolic (congestive) heart failure [facility])    cardiologist-  dr Rogue Jury  . COPD with emphysema ([place])   . Coronary artery disease cardiologist-  dr Kathlyn [place]   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% per cardiac cath 03-03-2014  . DDD (degenerative disc disease), thoracolumbar   . Ectopic atrial beats    2015  alternating BBB  . Frequent urination   . Full dentures   . Gait instability    due to chronic low back pain, uses roller walker  . GERD (gastroesophageal reflux disease)   . History of iron deficiency anemia 11/2013   resolved w/ IV Iron  . History of stomach ulcers 11/2013  . Hypertension   . LBBB (left bundle branch block)   . NICM (nonischemic cardiomyopathy) ([place]) last echo 12-31-2015 ef 55-60%   dx 09/ 2015 per echo 20%;  myoview 09/ 2015 ef 28%;  per cardiac cath 12/ 2015 ef 30%;    . Nocturia more than twice per night   . Scoliosis     Past Surgical History:  Procedure Laterality Date  . BI-VENTRICULAR IMPLANTABLE CARDIOVERTER DEFIBRILLATOR N/A 05/07/2014   Procedure: BI-VENTRICULAR IMPLANTABLE CARDIOVERTER DEFIBRILLATOR  (CRT-D);  Surgeon: Deboraha Sprang, MD;  Location: [facility] CATH LAB;  Service: Cardiovascular;  Laterality: N/A;  . CARDIAC CATHETERIZATION  03-03-2014  dr Kathlyn [place]   [facility]   pLAD 20%, pRCA 20%, dRCA 50%, RPLS 50%;  ef 30%, mild elevated LVEDP, mild gradient across aortic valve LVOT  . CARDIOVASCULAR STRESS TEST  11/25/2013   High risk nuclear study w/ large high severity inferior wall perfusion defect on stress and rest images, large mild severity anteroseptal wall perfusion defect on stress and rest images, No inducible ischemia/ global moderate hypokinesis, ef 28%  . CATARACT EXTRACTION W/ INTRAOCULAR LENS  IMPLANT, BILATERAL Bilateral right 12-2013 / left   02-2014  . CYSTOSCOPY W/ RETROGRADES Bilateral 01/06/2015   Procedure: CYSTOSCOPY WITH  BLADDER BIOPSY BILATERAL RETROGRADE PYELOGRAM,INSTILLATION OF MITOMYCIN C;  Surgeon: [name], MD;  [facility];  Service: Urology;  Laterality: Bilateral;  . CYSTOSCOPY WITH BIOPSY N/A 10/06/2015   Procedure: CYSTO WITH BLADDER BIOPSY, FULGERATION, CHEMO IRRIGATION EPIRUBICIN IN PACU;  Surgeon: [name], MD;  [facility];  Service: Urology;  Laterality: N/A;  . CYSTOSCOPY WITH FULGERATION N/A 06/30/2017   Procedure: Marland Kitchen CYSTOSCOPY WITH [place] FULGERATION/ BLADDER BIOPSY/ INSTILLATION OF EPIRUBICIN;  Surgeon: [name], MD;  Location: [facility];  Service: Urology;  Laterality: N/A;  . CYSTOSCOPY WITH RETROGRADE PYELOGRAM, URETEROSCOPY AND STENT PLACEMENT Bilateral 04/18/2014   Procedure: CYSTOSCOPY WITH RETROGRADE PYELOGRAM;  Surgeon: [name], MD;  [facility];  Service: Urology;  Laterality: Bilateral;  . ESOPHAGOGASTRODUODENOSCOPY N/A 11/28/2013   Procedure: ESOPHAGOGASTRODUODENOSCOPY (EGD);  Surgeon: Arta Silence, MD;  Location: [facility] ENDOSCOPY;  Service: Endoscopy;  Laterality: N/A;  . LUMBAR [place]  1980's   "ruptured disc"  . TRANSTHORACIC ECHOCARDIOGRAM  12/31/2015   dr Caryl Comes   ef 16-109, grade 1 diastolic dysfunction/ mild MR/ septal motion showed abnormal function and dyssynergy  . TRANSURETHRAL RESECTION OF BLADDER  1995  . TRANSURETHRAL RESECTION OF BLADDER TUMOR N/A 12/13/2013   Procedure: TRANSURETHRAL RESECTION OF BLADDER TUMOR (TURBT);  Surgeon: [name], MD;  [facility];  Service: Urology;  Laterality: N/A;  . TRANSURETHRAL RESECTION OF BLADDER TUMOR N/A 01/17/2014   Procedure: TRANSURETHRAL RESECTION OF BLADDER TUMOR (TURBT);  Surgeon: [name], MD;  [facility];  Service: Urology;  Laterality: N/A;  . TRANSURETHRAL RESECTION OF BLADDER TUMOR N/A 04/18/2014   Procedure: TRANSURETHRAL RESECTION  OF BLADDER TUMOR (TURBT), CYSTOGRAM;  Surgeon: [name], MD;  [facility];  Service: Urology;  Laterality: N/A;    Family History  Problem Relation Age of Onset  . Cancer Mother   . Cancer Brother     SOCIAL HX: single,  Lives with daughter Mervin Kung .  smoking   Current Outpatient Medications:  .  amLODipine (NORVASC) 2.5 MG tablet, TAKE 1 TABLET BY MOUTH DAILY, Disp: 90 tablet, Rfl: 2 .  gabapentin (NEURONTIN) 300 MG capsule, Three times daily as needed for back pain, Disp: 90 capsule, Rfl: 5 .  HYDROcodone-acetaminophen (NORCO) 10-325 MG tablet, Take 1 tablet by mouth every 4 (four) hours as needed for up to 30 days. For chronic pain. [place] STOP ACT - NA, Disp: 180 tablet, Rfl: 0 .  [START ON 09/29/2018] HYDROcodone-acetaminophen (NORCO) 10-325 MG tablet, Take 1 tablet by mouth every 4 (four) hours as needed for up to 30 days for severe pain. Must last 30 days., Disp: 180 tablet, Rfl: 0 .  [START ON 10/29/2018] HYDROcodone-acetaminophen (NORCO) 10-325 MG tablet, Take 1 tablet by mouth every 4 (four) hours as needed for up to 30 days for severe pain. Must last 30 days., Disp: 180 tablet, Rfl: 0 .  losartan (COZAAR) 100 MG tablet, Take 1 tablet (100 mg total) by mouth at bedtime., Disp: 90 tablet, Rfl: 1 .  metoprolol succinate (TOPROL-XL) 25 MG 24 hr tablet, Take 1 tablet (25 mg total) by mouth daily., Disp: 90 tablet, Rfl: 3 .  pantoprazole (PROTONIX) 40 MG tablet, TAKE 1 TABLET(40 MG) BY MOUTH TWICE DAILY (Patient taking differently: Take 40 mg by mouth daily. TAKE 1 TABLET(40 MG) AT BEDTIME.), Disp: 180 tablet, Rfl: 1 .  polyethylene glycol (MIRALAX / GLYCOLAX) packet, Take 17 g by mouth daily as needed for mild constipation. , Disp: , Rfl:  .  promethazine (PHENERGAN) 12.5 MG tablet, Take 1 tablet (12.5 mg total) by mouth every 8 (eight) hours as needed for nausea or vomiting., Disp: 30 tablet, Rfl: 2 .  Vitamin D, Ergocalciferol, (DRISDOL) 50000 units CAPS capsule, TK ONE C  PO Q WEEK FOR 8 WEEKS, Disp: , Rfl: 0 No current facility-administered medications for this visit.   Facility-Administered Medications Ordered in Other Visits:  .  epirubicin (ELLENCE) 50 mg in sodium chloride 0.9 % bladder instillation, 50 mg, Bladder Instillation, Once, [name], MD  EXAM:  General impression: alert, cooperative and articulate.  No signs of being in distress  Lungs: speech is fluent sentence length suggests that patient is not short of breath and not punctuated by cough, sneezing or sniffing. Marland Kitchen   Psych: affect normal.  speech is articulate and non pressured .  Denies suicidal thoughts    ASSESSMENT AND PLAN:  Discussed the following assessment and plan:  Osteoporosis Discussed the risks and benefits of all available treatment options,  Ultimately decided on Prolia .  She will need to have her calcium and vitamin d levels obtained prior to each dose.     I discussed the assessment and treatment plan with the patient. The patient was provided an opportunity to ask questions and all were answered. The patient agreed with the plan and demonstrated an understanding of the instructions.   The patient was advised to call back or seek an in-person evaluation if the symptoms worsen or if the condition fails to improve as anticipated.  I provided 25 minutes of non-face-to-face time during this encounter.   [name], MD

## 2018-09-15 NOTE — Assessment & Plan Note (Signed)
Discussed the risks and benefits of all available treatment options,  Ultimately decided on Prolia .  She will need to have her calcium and vitamin d levels obtained prior to each dose.

## 2018-09-25 ENCOUNTER — Other Ambulatory Visit: Payer: Medicare Other

## 2018-09-27 ENCOUNTER — Telehealth: Payer: Self-pay | Admitting: *Deleted

## 2018-09-27 NOTE — Telephone Encounter (Signed)
-----   Message from [name], MD sent at 09/11/2018  3:11 PM EDT ----- Regarding: Prolia Please let [name] know that we will work on getting Prolia authorized for her osteoporosis.  She needs to start taking 5 vitamin D capsules daily (5000 IUs)   and getting 1200 to 1800 mg calcium daily through diet and supplements  [name] can you start the process for Prolia  T socre -3.4 hip I have the dexa scan done at [facility] imaging

## 2018-09-27 NOTE — Telephone Encounter (Signed)
Insurance verification for Prolia filed on [employer] Portal. 

## 2018-09-30 DIAGNOSIS — Z01818 Encounter for other preprocedural examination: Secondary | ICD-10-CM | POA: Diagnosis not present

## 2018-09-30 DIAGNOSIS — M5441 Lumbago with sciatica, right side: Secondary | ICD-10-CM | POA: Diagnosis not present

## 2018-09-30 DIAGNOSIS — M5442 Lumbago with sciatica, left side: Secondary | ICD-10-CM | POA: Diagnosis not present

## 2018-09-30 DIAGNOSIS — G8929 Other chronic pain: Secondary | ICD-10-CM | POA: Diagnosis not present

## 2018-09-30 DIAGNOSIS — Z1159 Encounter for screening for other viral diseases: Secondary | ICD-10-CM | POA: Diagnosis not present

## 2018-10-02 DIAGNOSIS — Z95810 Presence of automatic (implantable) cardiac defibrillator: Secondary | ICD-10-CM | POA: Diagnosis not present

## 2018-10-02 DIAGNOSIS — G894 Chronic pain syndrome: Secondary | ICD-10-CM | POA: Diagnosis not present

## 2018-10-02 DIAGNOSIS — I4891 Unspecified atrial fibrillation: Secondary | ICD-10-CM | POA: Diagnosis not present

## 2018-10-02 DIAGNOSIS — I739 Peripheral vascular disease, unspecified: Secondary | ICD-10-CM | POA: Diagnosis not present

## 2018-10-02 DIAGNOSIS — M4805 Spinal stenosis, thoracolumbar region: Secondary | ICD-10-CM | POA: Diagnosis not present

## 2018-10-02 DIAGNOSIS — M48061 Spinal stenosis, lumbar region without neurogenic claudication: Secondary | ICD-10-CM | POA: Diagnosis not present

## 2018-10-02 DIAGNOSIS — F17210 Nicotine dependence, cigarettes, uncomplicated: Secondary | ICD-10-CM | POA: Diagnosis not present

## 2018-10-02 DIAGNOSIS — I447 Left bundle-branch block, unspecified: Secondary | ICD-10-CM | POA: Diagnosis not present

## 2018-10-02 DIAGNOSIS — I110 Hypertensive heart disease with heart failure: Secondary | ICD-10-CM | POA: Diagnosis not present

## 2018-10-02 DIAGNOSIS — M5136 Other intervertebral disc degeneration, lumbar region: Secondary | ICD-10-CM | POA: Diagnosis not present

## 2018-10-02 DIAGNOSIS — M5441 Lumbago with sciatica, right side: Secondary | ICD-10-CM | POA: Diagnosis not present

## 2018-10-02 DIAGNOSIS — I509 Heart failure, unspecified: Secondary | ICD-10-CM | POA: Diagnosis not present

## 2018-10-02 DIAGNOSIS — M5442 Lumbago with sciatica, left side: Secondary | ICD-10-CM | POA: Diagnosis not present

## 2018-10-02 DIAGNOSIS — K219 Gastro-esophageal reflux disease without esophagitis: Secondary | ICD-10-CM | POA: Diagnosis not present

## 2018-10-02 DIAGNOSIS — Z8551 Personal history of malignant neoplasm of bladder: Secondary | ICD-10-CM | POA: Diagnosis not present

## 2018-10-02 DIAGNOSIS — M810 Age-related osteoporosis without current pathological fracture: Secondary | ICD-10-CM | POA: Diagnosis not present

## 2018-10-02 DIAGNOSIS — J449 Chronic obstructive pulmonary disease, unspecified: Secondary | ICD-10-CM | POA: Diagnosis not present

## 2018-10-02 DIAGNOSIS — I420 Dilated cardiomyopathy: Secondary | ICD-10-CM | POA: Diagnosis not present

## 2018-10-02 DIAGNOSIS — I2510 Atherosclerotic heart disease of native coronary artery without angina pectoris: Secondary | ICD-10-CM | POA: Diagnosis not present

## 2018-10-02 DIAGNOSIS — M47816 Spondylosis without myelopathy or radiculopathy, lumbar region: Secondary | ICD-10-CM | POA: Diagnosis not present

## 2018-10-02 DIAGNOSIS — M5440 Lumbago with sciatica, unspecified side: Secondary | ICD-10-CM | POA: Diagnosis not present

## 2018-10-03 DIAGNOSIS — M48061 Spinal stenosis, lumbar region without neurogenic claudication: Secondary | ICD-10-CM | POA: Diagnosis not present

## 2018-10-03 DIAGNOSIS — M5136 Other intervertebral disc degeneration, lumbar region: Secondary | ICD-10-CM | POA: Diagnosis not present

## 2018-10-03 DIAGNOSIS — M5442 Lumbago with sciatica, left side: Secondary | ICD-10-CM | POA: Diagnosis not present

## 2018-10-03 DIAGNOSIS — G894 Chronic pain syndrome: Secondary | ICD-10-CM | POA: Diagnosis not present

## 2018-10-03 DIAGNOSIS — M5441 Lumbago with sciatica, right side: Secondary | ICD-10-CM | POA: Diagnosis not present

## 2018-10-03 DIAGNOSIS — M4805 Spinal stenosis, thoracolumbar region: Secondary | ICD-10-CM | POA: Diagnosis not present

## 2018-10-09 DIAGNOSIS — M4805 Spinal stenosis, thoracolumbar region: Secondary | ICD-10-CM | POA: Diagnosis not present

## 2018-10-09 DIAGNOSIS — M4802 Spinal stenosis, cervical region: Secondary | ICD-10-CM | POA: Diagnosis not present

## 2018-10-09 DIAGNOSIS — M48061 Spinal stenosis, lumbar region without neurogenic claudication: Secondary | ICD-10-CM | POA: Diagnosis not present

## 2018-10-09 DIAGNOSIS — F17210 Nicotine dependence, cigarettes, uncomplicated: Secondary | ICD-10-CM | POA: Diagnosis not present

## 2018-10-09 DIAGNOSIS — Z8551 Personal history of malignant neoplasm of bladder: Secondary | ICD-10-CM | POA: Diagnosis not present

## 2018-10-09 DIAGNOSIS — G894 Chronic pain syndrome: Secondary | ICD-10-CM | POA: Diagnosis not present

## 2018-10-09 DIAGNOSIS — K219 Gastro-esophageal reflux disease without esophagitis: Secondary | ICD-10-CM | POA: Diagnosis not present

## 2018-10-09 DIAGNOSIS — I420 Dilated cardiomyopathy: Secondary | ICD-10-CM | POA: Diagnosis not present

## 2018-10-09 DIAGNOSIS — I739 Peripheral vascular disease, unspecified: Secondary | ICD-10-CM | POA: Diagnosis not present

## 2018-10-09 DIAGNOSIS — Z950 Presence of cardiac pacemaker: Secondary | ICD-10-CM | POA: Diagnosis not present

## 2018-10-09 DIAGNOSIS — Z1159 Encounter for screening for other viral diseases: Secondary | ICD-10-CM | POA: Diagnosis not present

## 2018-10-09 DIAGNOSIS — I2510 Atherosclerotic heart disease of native coronary artery without angina pectoris: Secondary | ICD-10-CM | POA: Diagnosis not present

## 2018-10-09 DIAGNOSIS — J449 Chronic obstructive pulmonary disease, unspecified: Secondary | ICD-10-CM | POA: Diagnosis not present

## 2018-10-09 DIAGNOSIS — Z79899 Other long term (current) drug therapy: Secondary | ICD-10-CM | POA: Diagnosis not present

## 2018-10-09 DIAGNOSIS — I10 Essential (primary) hypertension: Secondary | ICD-10-CM | POA: Diagnosis not present

## 2018-10-09 NOTE — Telephone Encounter (Signed)
Called to schedule patient Prolia injection left message to call office ok to schedule.

## 2018-10-16 ENCOUNTER — Other Ambulatory Visit: Payer: Medicare Other

## 2018-10-16 NOTE — Telephone Encounter (Signed)
Patient has recently had back surgery and wishes not to schedule Prolia injection until released by surgeon. Advised Patient to please call office and schedule injection .

## 2018-10-22 DIAGNOSIS — G8929 Other chronic pain: Secondary | ICD-10-CM | POA: Diagnosis not present

## 2018-10-22 DIAGNOSIS — M5442 Lumbago with sciatica, left side: Secondary | ICD-10-CM | POA: Diagnosis not present

## 2018-10-22 DIAGNOSIS — M5441 Lumbago with sciatica, right side: Secondary | ICD-10-CM | POA: Diagnosis not present

## 2018-10-24 ENCOUNTER — Telehealth: Payer: Self-pay

## 2018-10-24 NOTE — Telephone Encounter (Signed)
Copied from [place] [id number]. Topic: Quick Communication - Appointment Cancellation >> Oct 24, 2018  8:29 AM Richardo Priest, NT wrote: Patient called to cancel appointment scheduled for 8/6. Patient has not rescheduled their appointment. Patient would also like to ask CMA a few questions with medications. Please advise and call back is [phone].   Route to department's PEC pool.

## 2018-10-24 NOTE — Telephone Encounter (Signed)
Appt for 8/6 cancelled per pt request.  Pt has questions about medications for CMA.  CB# [phone].

## 2018-10-25 ENCOUNTER — Ambulatory Visit: Payer: Medicare Other | Admitting: Internal Medicine

## 2018-10-31 NOTE — Telephone Encounter (Signed)
Pt stated that she had to cancel her appt because she recently had back surgery and has not been release to drive yet. Asked pt what the questions were she has about her medication and she stated that "I am fine for now". She stated that if she needs anything she will give our office a call.

## 2018-11-14 ENCOUNTER — Telehealth: Payer: Self-pay

## 2018-11-14 ENCOUNTER — Encounter: Payer: Self-pay | Admitting: Student in an Organized Health Care Education/Training Program

## 2018-11-14 NOTE — Telephone Encounter (Signed)
Attempted to call [patient. No answer, leftt message to cal today before tomorrows appt.

## 2018-11-15 ENCOUNTER — Other Ambulatory Visit: Payer: Self-pay

## 2018-11-15 ENCOUNTER — Telehealth: Payer: Self-pay | Admitting: *Deleted

## 2018-11-15 ENCOUNTER — Ambulatory Visit
Payer: Medicare Other | Attending: Student in an Organized Health Care Education/Training Program | Admitting: Student in an Organized Health Care Education/Training Program

## 2018-11-15 ENCOUNTER — Encounter: Payer: Self-pay | Admitting: Student in an Organized Health Care Education/Training Program

## 2018-11-15 DIAGNOSIS — M5136 Other intervertebral disc degeneration, lumbar region: Secondary | ICD-10-CM

## 2018-11-15 DIAGNOSIS — M5416 Radiculopathy, lumbar region: Secondary | ICD-10-CM

## 2018-11-15 DIAGNOSIS — M47816 Spondylosis without myelopathy or radiculopathy, lumbar region: Secondary | ICD-10-CM | POA: Diagnosis not present

## 2018-11-15 DIAGNOSIS — M48062 Spinal stenosis, lumbar region with neurogenic claudication: Secondary | ICD-10-CM | POA: Diagnosis not present

## 2018-11-15 DIAGNOSIS — G894 Chronic pain syndrome: Secondary | ICD-10-CM

## 2018-11-15 MED ORDER — HYDROCODONE-ACETAMINOPHEN 10-325 MG PO TABS: 1.0000 | ORAL_TABLET | ORAL | 0 refills | Status: AC | PRN

## 2018-11-15 NOTE — Progress Notes (Signed)
Pain Management Virtual Encounter Note - Virtual Visit via Telephone Telehealth (real-time audio visits between healthcare provider and patient).   Patient's Phone No. & Preferred Pharmacy:  [phone] (home); [phone] (mobile); (Preferred) [phone] [email or web address]  [place] [id number] [name], [address] [place] [address] Phone: [phone] Fax: [phone]    Pre-screening note:  Our staff contacted [name] and offered her an "in person", "face-to-face" appointment versus a telephone encounter. She indicated preferring the telephone encounter, at this time.   Reason for Virtual Visit: COVID-19*  Social distancing based on CDC and AMA recommendations.   I contacted [name] on 11/15/2018 via telephone.      I clearly identified myself as [name], MD. I verified that I was speaking with the correct person using two identifiers (Name: [name], and date of birth: [birth year]).  Advanced Informed Consent I sought verbal advanced consent from [name] for virtual visit interactions. I informed [name] of possible security and privacy concerns, risks, and limitations associated with providing "not-in-person" medical evaluation and management services. I also informed [name] of the availability of "in-person" appointments. Finally, I informed her that there would be a charge for the virtual visit and that she could be  personally, fully or partially, financially responsible for it. [name] expressed understanding and agreed to proceed.   Historic Elements   [name] is a 78 y.o. year old, female patient evaluated today after her last encounter by our practice on 11/14/2018. [name]  has a past medical history of AICD (automatic cardioverter/defibrillator) present (placement 05/07/2014 medtronic    ), Arthritis, Bladder cancer ([place]) (urologist-  dr Junious Silk),  Chronic hyponatremia, Chronic lower back pain, Chronic systolic (congestive) heart failure ([place]), COPD with emphysema ([place]), Coronary artery disease (cardiologist-  dr Kathlyn Sacramento), DDD (degenerative disc disease), thoracolumbar, Ectopic atrial beats, Frequent urination, Full dentures, Gait instability, GERD (gastroesophageal reflux disease), History of iron deficiency anemia (11/2013), History of stomach ulcers (11/2013), Hypertension, LBBB (left bundle branch block), NICM (nonischemic cardiomyopathy) ([place]) (last echo 12-31-2015 ef 55-60%), Nocturia more than twice per night, and Scoliosis. She also  has a past surgical history that includes Esophagogastroduodenoscopy (N/A, 11/28/2013); Transurethral resection of bladder tumor (N/A, 12/13/2013); Transurethral resection of bladder tumor (N/A, 01/17/2014); Cataract extraction w/ intraocular lens  implant, bilateral (Bilateral, right 12-2013 / left  02-2014); Cystoscopy with retrograde pyelogram, ureteroscopy and stent placement (Bilateral, 04/18/2014); Transurethral resection of bladder tumor (N/A, 04/18/2014); Lumbar disc surgery (1980's); bi-ventricular implantable cardioverter defibrillator (N/A, 05/07/2014); Cystoscopy w/ retrogrades (Bilateral, 01/06/2015); Cystoscopy with biopsy (N/A, 10/06/2015); Cardiac catheterization (03-03-2014  dr Kathlyn Sacramento   [facility]); Transurethral resection of bladder (1995); Cardiovascular stress test (11/25/2013); transthoracic echocardiogram (12/31/2015   dr Caryl Comes); and Cystoscopy with fulgeration (N/A, 06/30/2017). [name] has a current medication list which includes the following prescription(s): acetaminophen, amlodipine, gabapentin, hydrocodone-acetaminophen, hydrocodone-acetaminophen, hydrocodone-acetaminophen, losartan, metoprolol succinate, polyethylene glycol, promethazine, rosuvastatin, and vitamin d (ergocalciferol), and the following Facility-Administered Medications: epirubicin (ELLENCE) 50 mg in sodium chloride 0.9 %  bladder instillation. She  reports that she has been smoking cigarettes. She has a 27.50 pack-year smoking history. She has never used smokeless tobacco. She reports that she does not drink alcohol or use drugs. Ms. Arlen has No Known Allergies.   HPI   Patient follows up today for medication management.  Of note patient did have Nevro spinal cord stimulator implanted  on 10/09/2018 at [facility] with Dr. Mearl Latin.  Patient states that the first 3 weeks she had moderate to significant pain relief in regards to her back and leg pain but that has now reduced.  I encouraged her to continue working with Nevro spinal cord stimulator device representatives to help optimize programming.  I asked if the patient was in a position to wean her hydrocodone from every 4 hours to every 6 hours and the patient replied no.  She states that she is still hopeful that the spinal cord stimulator will result in pain improvement at which point we can consider weaning her opioid analgesics but she is not in a position to do that at this time.  Pharmacotherapy Assessment   10/28/2018  2   08/16/2018  Hydrocodone-Acetamin 10-325 MG  180.00 30 Bi Lat   [id number]   Wal (7587)   0  60.00 MME  Comm Ins   [place]    Monitoring: Pharmacotherapy: No side-effects or adverse reactions reported. [place] PMP: PDMP reviewed during this encounter.       Compliance: No problems identified. Effectiveness: Clinically acceptable. Plan: Refer to "POC".  UDS:  Summary  Date Value Ref Range Status  02/06/2018 FINAL  Final    Comment:    ==================================================================== TOXASSURE SELECT 13 (MW) ==================================================================== Test                             Result       Flag       Units Drug Present   Hydrocodone                    3616                    ng/mg creat   Hydromorphone                  1604                    ng/mg creat   Dihydrocodeine                 328                      ng/mg creat   Norhydrocodone                 5344                    ng/mg creat    Sources of hydrocodone include scheduled prescription    medications. Hydromorphone, dihydrocodeine and norhydrocodone are    expected metabolites of hydrocodone. Hydromorphone and    dihydrocodeine are also available as scheduled prescription    medications. ==================================================================== Test                      Result    Flag   Units      Ref Range   Creatinine              25               mg/dL      >=20 ==================================================================== Declared Medications:  Medication list was not provided. ==================================================================== For clinical consultation, please call [phone]. ====================================================================    Laboratory Chemistry Profile (12 mo)  Renal: No results found for requested labs within last 8760 hours.  Lab Results  Component Value  Date   GFRAA >60 10/02/2015   GFRNONAA >60 10/02/2015   Hepatic: No results found for requested labs within last 8760 hours. Lab Results  Component Value Date   AST 25 03/24/2017   ALT 17 03/24/2017   Other: No results found for requested labs within last 8760 hours. Note: Above Lab results reviewed.  Imaging  Last 90 days:  No results found.  Assessment  The primary encounter diagnosis was Chronic pain syndrome. Diagnoses of Lumbar radiculopathy, Lumbar degenerative disc disease, Spinal stenosis, lumbar region, with neurogenic claudication, Lumbar facet arthropathy, and Lumbar spondylosis were also pertinent to this visit.  Plan of Care  I have discontinued Kimberlye C. Pohl's pantoprazole. I have also changed her HYDROcodone-acetaminophen. Additionally, I am having her start on HYDROcodone-acetaminophen and HYDROcodone-acetaminophen. Lastly, I am having her maintain her polyethylene glycol,  Vitamin D (Ergocalciferol), metoprolol succinate, gabapentin, promethazine, amLODipine, losartan, rosuvastatin, and acetaminophen.  Continue to work with spinal cord stimulator device representatives to optimize SCS programming.  Medication refill as below.  Follow-up in 3 months in person.  Pharmacotherapy (Medications Ordered): Meds ordered this encounter  Medications  . HYDROcodone-acetaminophen (NORCO) 10-325 MG tablet    Sig: Take 1 tablet by mouth every 4 (four) hours as needed for severe pain. Must last 30 days.    Dispense:  180 tablet    Refill:  0    Chronic Pain. (STOP Act - Not applicable). Fill one day early if closed on scheduled refill date.  Marland Kitchen HYDROcodone-acetaminophen (NORCO) 10-325 MG tablet    Sig: Take 1 tablet by mouth every 4 (four) hours as needed for severe pain. Must last 30 days.    Dispense:  180 tablet    Refill:  0    Chronic Pain. (STOP Act - Not applicable). Fill one day early if closed on scheduled refill date.  Marland Kitchen HYDROcodone-acetaminophen (NORCO) 10-325 MG tablet    Sig: Take 1 tablet by mouth every 4 (four) hours as needed for severe pain. Must last 30 days.    Dispense:  180 tablet    Refill:  0    Chronic Pain. (STOP Act - Not applicable). Fill one day early if closed on scheduled refill date.   Follow-up plan:   Return in about 3 months (around 02/15/2019) for Medication Management, in person.        Recent Visits No visits were found meeting these conditions.  Showing recent visits within past 90 days and meeting all other requirements   Today's Visits Date Type Provider Dept  11/15/18 Office Visit [name], MD [facility]  Showing today's visits and meeting all other requirements   Future Appointments Date Type Provider Dept  02/07/19 Appointment [name], MD [facility]  Showing future appointments within next 90 days and meeting all other requirements   I discussed the assessment and treatment plan with the  patient. The patient was provided an opportunity to ask questions and all were answered. The patient agreed with the plan and demonstrated an understanding of the instructions.  Patient advised to call back or seek an in-person evaluation if the symptoms or condition worsens.  Total duration of non-face-to-face encounter:25 minutes.  Note by: [name], MD Date: 11/15/2018; Time: 12:28 PM  Note: This dictation was prepared with Dragon dictation. Any transcriptional errors that may result from this process are unintentional.  Disclaimer:  * Given the special circumstances of the COVID-19 pandemic, the federal government has announced that the Office for Civil Rights (OCR)  will exercise its enforcement discretion and will not impose penalties on physicians using telehealth in the event of noncompliance with regulatory requirements under the [place] and Accountability Act (HIPAA) in connection with the good faith provision of telehealth during the XX123456 national public health emergency. ([place])

## 2018-11-28 ENCOUNTER — Ambulatory Visit (INDEPENDENT_AMBULATORY_CARE_PROVIDER_SITE_OTHER): Payer: Medicare Other | Admitting: *Deleted

## 2018-11-28 DIAGNOSIS — I420 Dilated cardiomyopathy: Secondary | ICD-10-CM | POA: Diagnosis not present

## 2018-11-28 DIAGNOSIS — I447 Left bundle-branch block, unspecified: Secondary | ICD-10-CM

## 2018-11-28 LAB — CUP PACEART REMOTE DEVICE CHECK
Battery Remaining Longevity: 25 mo
Battery Voltage: 2.92 V
Brady Statistic AP VP Percent: 41.85 %
Brady Statistic AP VS Percent: 0.68 %
Brady Statistic AS VP Percent: 56.23 %
Brady Statistic AS VS Percent: 1.25 %
Brady Statistic RA Percent Paced: 40.77 %
Brady Statistic RV Percent Paced: 89.49 %
Date Time Interrogation Session: 20200909041704
HighPow Impedance: 70 Ohm
Implantable Lead Implant Date: 20160217
Implantable Lead Implant Date: 20160217
Implantable Lead Implant Date: 20160217
Implantable Lead Location: 753858
Implantable Lead Location: 753859
Implantable Lead Location: 753860
Implantable Lead Model: 4298
Implantable Lead Model: 5076
Implantable Pulse Generator Implant Date: 20160217
Lead Channel Impedance Value: 361 Ohm
Lead Channel Impedance Value: 361 Ohm
Lead Channel Impedance Value: 399 Ohm
Lead Channel Impedance Value: 399 Ohm
Lead Channel Impedance Value: 399 Ohm
Lead Channel Impedance Value: 475 Ohm
Lead Channel Impedance Value: 475 Ohm
Lead Channel Impedance Value: 513 Ohm
Lead Channel Impedance Value: 646 Ohm
Lead Channel Impedance Value: 646 Ohm
Lead Channel Impedance Value: 760 Ohm
Lead Channel Impedance Value: 779 Ohm
Lead Channel Impedance Value: 779 Ohm
Lead Channel Pacing Threshold Amplitude: 0.5 V
Lead Channel Pacing Threshold Amplitude: 1 V
Lead Channel Pacing Threshold Amplitude: 1.125 V
Lead Channel Pacing Threshold Pulse Width: 0.4 ms
Lead Channel Pacing Threshold Pulse Width: 0.4 ms
Lead Channel Pacing Threshold Pulse Width: 0.4 ms
Lead Channel Sensing Intrinsic Amplitude: 13.875 mV
Lead Channel Sensing Intrinsic Amplitude: 13.875 mV
Lead Channel Sensing Intrinsic Amplitude: 2.875 mV
Lead Channel Sensing Intrinsic Amplitude: 2.875 mV
Lead Channel Setting Pacing Amplitude: 2 V
Lead Channel Setting Pacing Amplitude: 2.25 V
Lead Channel Setting Pacing Amplitude: 2.5 V
Lead Channel Setting Pacing Pulse Width: 0.4 ms
Lead Channel Setting Pacing Pulse Width: 0.4 ms
Lead Channel Setting Sensing Sensitivity: 0.3 mV

## 2018-12-06 ENCOUNTER — Telehealth: Payer: Self-pay

## 2018-12-06 NOTE — Telephone Encounter (Signed)
Copied from [place] [id number]. Topic: General - Other >> Dec 06, 2018 10:23 AM Keene Breath wrote: Reason for CRM: Patient is returning a call to the office for screening questions.  Tried the office but no answer.  Please call patient back at [phone]

## 2018-12-06 NOTE — Telephone Encounter (Signed)
Pt returning your call

## 2018-12-07 ENCOUNTER — Encounter: Payer: Self-pay | Admitting: Cardiovascular Disease

## 2018-12-07 ENCOUNTER — Ambulatory Visit (INDEPENDENT_AMBULATORY_CARE_PROVIDER_SITE_OTHER): Payer: Medicare Other | Admitting: Cardiovascular Disease

## 2018-12-07 ENCOUNTER — Other Ambulatory Visit: Payer: Self-pay

## 2018-12-07 VITALS — BP 140/90 | HR 75 | Ht 68.0 in | Wt 123.8 lb

## 2018-12-07 DIAGNOSIS — I10 Essential (primary) hypertension: Secondary | ICD-10-CM | POA: Diagnosis not present

## 2018-12-07 DIAGNOSIS — I5022 Chronic systolic (congestive) heart failure: Secondary | ICD-10-CM | POA: Diagnosis not present

## 2018-12-07 DIAGNOSIS — Z720 Tobacco use: Secondary | ICD-10-CM

## 2018-12-07 NOTE — Patient Instructions (Addendum)
Medication Instructions:  Your physician recommends that you continue on your current medications as directed. Please refer to the Current Medication list given to you today.  Please take Losartan 100mg  daily not  just as needed  If you need a refill on your cardiac medications before your next appointment, please call your pharmacy.   Lab work: None ordered  If you have labs (blood work) drawn today and your tests are completely normal, you will receive your results only by: Marland Kitchen MyChart Message (if you have MyChart) OR . A paper copy in the mail If you have any lab test that is abnormal or we need to change your treatment, we will call you to review the results.  Testing/Procedures: None ordered  Follow-Up: At [facility], you and your health needs are our priority.  As part of our continuing mission to provide you with exceptional heart care, we have created designated Provider Care Teams.  These Care Teams include your primary Cardiologist (physician) and Advanced Practice Providers (APPs -  Physician Assistants and Nurse Practitioners) who all work together to provide you with the care you need, when you need it. You will need a follow up appointment in 6 months.  Please call our office 2 months in advance to schedule this appointment.  You may see  Dr. Fletcher Anon  or one of the following Advanced Practice Providers on your designated Care Team:   Murray Hodgkins, NP Christell Faith, PA-C . Marrianne Mood, PA-C  Any Other Special Instructions Will Be Listed Below (If Applicable). N/A

## 2018-12-07 NOTE — Progress Notes (Signed)
Cardiology Office Note   Date:  12/07/2018   ID:  GARCIA MEDEMA, DOB 10-31-1940, MRN [id number]  PCP:  Bianca Mc, MD  Cardiologist:   Bianca Sacramento, MD   Chief Complaint  Patient presents with  . other    6 month f/u no complaints today. Meds reviewed verbally with pt.      History of Present Illness:  [name] is a 78 y.o. female who presents for a follow-up visit regarding chronic systolic heart failure due to nonischemic cardiomyopathy.  Previous ejection fraction was 20% in 2015.  She underwent cardiac catheterization in January 2016 which showed mild nonobstructive coronary artery disease with ejection fraction of 30%.She underwent ICD CRT placement by [name] in 04/2014.  Subsequent echocardiogram showed normal LV systolic function.  Most recent echocardiogram in June of this year showed an EF of 60 to 65% with no evidence of pulmonary hypertension. She has known history of bladder cancer that was treated. She reports chronic back pain which limits her activities. She continues to smoke 10 cigarettes per day.  Previous ABI was normal.  She has been doing well overall with no chest pain or worsening dyspnea.  No palpitations in spite of noted PVCs.  Her blood pressure is elevated today.  She has been taking losartan only as needed and not on a regular basis.   Past Medical History:  Diagnosis Date  . AICD (automatic cardioverter/defibrillator) present placement 05/07/2014 medtronic       ef 25%,  NICM/  [place] cardiologist-- dr Caryl Anderson  . Arthritis    "in about all my joints; for sure in my back"  . Bladder cancer [facility]) urologist-  dr Junious Anderson   dx 1995--  recurrent bladder cancer 2015 , s/p TURBT's and chemo instillation's  . Chronic hyponatremia   . Chronic lower back pain   . Chronic systolic (congestive) heart failure [facility])    cardiologist-  dr Rogue Anderson  . COPD with emphysema ([place])   . Coronary artery disease cardiologist-  dr [name]    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% per cardiac cath 03-03-2014  . DDD (degenerative disc disease), thoracolumbar   . Ectopic atrial beats    2015  alternating BBB  . Frequent urination   . Full dentures   . Gait instability    due to chronic low back pain, uses roller walker  . GERD (gastroesophageal reflux disease)   . History of iron deficiency anemia 11/2013   resolved w/ IV Iron  . History of stomach ulcers 11/2013  . Hypertension   . LBBB (left bundle branch block)   . NICM (nonischemic cardiomyopathy) ([place]) last echo 12-31-2015 ef 55-60%   dx 09/ 2015 per echo 20%;  myoview 09/ 2015 ef 28%;  per cardiac cath 12/ 2015 ef 30%;    . Nocturia more than twice per night   . Scoliosis     Past Surgical History:  Procedure Laterality Date  . BI-VENTRICULAR IMPLANTABLE CARDIOVERTER DEFIBRILLATOR N/A 05/07/2014   Procedure: BI-VENTRICULAR IMPLANTABLE CARDIOVERTER DEFIBRILLATOR  (CRT-D);  Surgeon: Deboraha Sprang, MD;  Location: [facility] CATH LAB;  Service: Cardiovascular;  Laterality: N/A;  . CARDIAC CATHETERIZATION  03-03-2014  dr [name]   [facility]   pLAD 20%, pRCA 20%, dRCA 50%, RPLS 50%;  ef 30%, mild elevated LVEDP, mild gradient across aortic valve LVOT  . CARDIOVASCULAR STRESS TEST  11/25/2013   High risk nuclear study w/ large high severity inferior wall perfusion defect on stress  and rest images, large mild severity anteroseptal wall perfusion defect on stress and rest images, No inducible ischemia/ global moderate hypokinesis, ef 28%  . CATARACT EXTRACTION W/ INTRAOCULAR LENS  IMPLANT, BILATERAL Bilateral right 12-2013 / left  02-2014  . CYSTOSCOPY W/ RETROGRADES Bilateral 01/06/2015   Procedure: CYSTOSCOPY WITH  BLADDER BIOPSY BILATERAL RETROGRADE PYELOGRAM,INSTILLATION OF MITOMYCIN C;  Surgeon: Bianca Aloe, MD;  Location: [facility] ORS;  Service: Urology;  Laterality: Bilateral;  . CYSTOSCOPY WITH BIOPSY N/A 10/06/2015   Procedure: CYSTO WITH BLADDER BIOPSY, FULGERATION, CHEMO IRRIGATION  EPIRUBICIN IN PACU;  Surgeon: Bianca Aloe, MD;  Location: [facility];  Service: Urology;  Laterality: N/A;  . CYSTOSCOPY WITH FULGERATION N/A 06/30/2017   Procedure: Marland Kitchen CYSTOSCOPY WITH [place] FULGERATION/ BLADDER BIOPSY/ INSTILLATION OF EPIRUBICIN;  Surgeon: Bianca Aloe, MD;  Location: [facility];  Service: Urology;  Laterality: N/A;  . CYSTOSCOPY WITH RETROGRADE PYELOGRAM, URETEROSCOPY AND STENT PLACEMENT Bilateral 04/18/2014   Procedure: CYSTOSCOPY WITH RETROGRADE PYELOGRAM;  Surgeon: Bianca Aloe, MD;  Location: [facility];  Service: Urology;  Laterality: Bilateral;  . ESOPHAGOGASTRODUODENOSCOPY N/A 11/28/2013   Procedure: ESOPHAGOGASTRODUODENOSCOPY (EGD);  Surgeon: Bianca Silence, MD;  Location[facility] ENDOSCOPY;  Service: Endoscopy;  Laterality: N/A;  . LAMINECTOMY THORACIC SPINE W/ PLACEMENT SPINAL CORD STIMULATOR    . LUMBAR DISC SURGERY  1980's   "ruptured disc"  . TRANSTHORACIC ECHOCARDIOGRAM  12/31/2015   dr Caryl Anderson   ef [id number], grade 1 diastolic dysfunction/ mild MR/ septal motion showed abnormal function and dyssynergy  . TRANSURETHRAL RESECTION OF BLADDER  1995  . TRANSURETHRAL RESECTION OF BLADDER TUMOR N/A 12/13/2013   Procedure: TRANSURETHRAL RESECTION OF BLADDER TUMOR (TURBT);  Surgeon: Bianca Aloe, MD;  Location: [facility];  Service: Urology;  Laterality: N/A;  . TRANSURETHRAL RESECTION OF BLADDER TUMOR N/A 01/17/2014   Procedure: TRANSURETHRAL RESECTION OF BLADDER TUMOR (TURBT);  Surgeon: Bianca Aloe, MD;  Location: [facility];  Service: Urology;  Laterality: N/A;  . TRANSURETHRAL RESECTION OF BLADDER TUMOR N/A 04/18/2014   Procedure: TRANSURETHRAL RESECTION OF BLADDER TUMOR (TURBT), CYSTOGRAM;  Surgeon: Bianca Aloe, MD;  Location: [facility];  Service: Urology;  Laterality: N/A;     Current Outpatient Medications  Medication Sig Dispense Refill  . acetaminophen (TYLENOL) 500 MG tablet Take 500 mg by mouth every 8 (eight) hours as needed.    Marland Kitchen  amLODipine (NORVASC) 2.5 MG tablet TAKE 1 TABLET BY MOUTH DAILY 90 tablet 2  . gabapentin (NEURONTIN) 300 MG capsule Three times daily as needed for back pain 90 capsule 5  . HYDROcodone-acetaminophen (NORCO) 10-325 MG tablet Take 1 tablet by mouth every 4 (four) hours as needed for severe pain. Must last 30 days. 180 tablet 0  . [START ON 12/27/2018] HYDROcodone-acetaminophen (NORCO) 10-325 MG tablet Take 1 tablet by mouth every 4 (four) hours as needed for severe pain. Must last 30 days. 180 tablet 0  . [START ON 01/26/2019] HYDROcodone-acetaminophen (NORCO) 10-325 MG tablet Take 1 tablet by mouth every 4 (four) hours as needed for severe pain. Must last 30 days. 180 tablet 0  . losartan (COZAAR) 100 MG tablet Take 1 tablet (100 mg total) by mouth at bedtime. 90 tablet 1  . metoprolol succinate (TOPROL-XL) 25 MG 24 hr tablet Take 1 tablet (25 mg total) by mouth daily. 90 tablet 3  . polyethylene glycol (MIRALAX / GLYCOLAX) packet Take 17 g by mouth daily as needed for mild constipation.     . promethazine (PHENERGAN) 12.5 MG tablet Take 1 tablet (12.5 mg  total) by mouth every 8 (eight) hours as needed for nausea or vomiting. 30 tablet 2  . rosuvastatin (CRESTOR) 5 MG tablet Take 5 mg by mouth daily.    . Vitamin D, Ergocalciferol, (DRISDOL) 50000 units CAPS capsule TK ONE C PO Q WEEK FOR 8 WEEKS  0   No current facility-administered medications for this visit.    Facility-Administered Medications Ordered in Other Visits  Medication Dose Route Frequency Provider Last Rate Last Dose  . epirubicin (ELLENCE) 50 mg in sodium chloride 0.9 % bladder instillation  50 mg Bladder Instillation Once Bianca Aloe, MD        Allergies:   Patient has no known allergies.    Social History:  The patient  reports that she has been smoking cigarettes. She has a 27.50 pack-year smoking history. She has never used smokeless tobacco. She reports that she does not drink alcohol or use drugs.   Family History:   The patient's family history includes Cancer in her brother and mother.    ROS:  Please see the history of present illness.   Otherwise, review of systems are positive for none.   All other systems are reviewed and negative.    PHYSICAL EXAM: VS:  BP 140/90 (BP Location: Left Arm, Patient Position: Sitting, Cuff Size: Normal)   Pulse 75   Ht 5\' 8"  (1.727 m)   Wt 123 lb 12 oz (56.1 kg)   SpO2 97%   BMI 18.82 kg/m  , BMI Body mass index is 18.82 kg/m. GEN: Well nourished, well developed, in no acute distress  HEENT: normal  Neck: no JVD, carotid bruits, or masses Cardiac: RRR; no murmurs, rubs, or gallops,no edema  Respiratory:  clear to auscultation bilaterally, normal work of breathing GI: soft, nontender, nondistended, + BS MS: no deformity or atrophy  Skin: warm and dry, no rash Neuro:  Strength and sensation are intact Psych: euthymic mood, full affect   EKG:  EKG is  ordered today. EKG showed sinus rhythm with PVCs and ventricular paced rhythm.   Recent Labs: No results found for requested labs within last 8760 hours.    Lipid Panel    Component Value Date/Time   CHOL 188 03/24/2017 1407   TRIG 156.0 (H) 03/24/2017 1407   HDL 44.40 03/24/2017 1407   CHOLHDL 4 03/24/2017 1407   VLDL 31.2 03/24/2017 1407   LDLCALC 113 (H) 03/24/2017 1407      Wt Readings from Last 3 Encounters:  12/07/18 123 lb 12 oz (56.1 kg)  06/07/18 116 lb 6.4 oz (52.8 kg)  05/08/18 120 lb (54.4 kg)       ASSESSMENT AND PLAN:  1.  Chronic systolic heart failure: Due to nonischemic cardiomyopathy. Currently New York Heart Association class II.  Most recent EF was 60 to 65%.  Continue treatment with losartan and Toprol.  I asked her to take losartan regularly and not as needed.  2. Tobacco use: I had a prolonged discussion with her about the importance of smoking cessation.  3. Status post ICD/CRT placement: Followed by [name].  4. Essential hypertension: Blood pressure is mildly  elevated today likely because she has not been taking losartan regularly.  Disposition:   FU with me in 6 months  Signed,  Bianca Sacramento, MD  12/07/2018 1:40 PM    [place]

## 2018-12-10 ENCOUNTER — Ambulatory Visit (INDEPENDENT_AMBULATORY_CARE_PROVIDER_SITE_OTHER): Payer: Medicare Other | Admitting: Internal Medicine

## 2018-12-10 ENCOUNTER — Encounter: Payer: Self-pay | Admitting: Internal Medicine

## 2018-12-10 ENCOUNTER — Other Ambulatory Visit: Payer: Self-pay

## 2018-12-10 VITALS — BP 142/82 | HR 76 | Temp 97.4°F | Wt 126.4 lb

## 2018-12-10 DIAGNOSIS — G8929 Other chronic pain: Secondary | ICD-10-CM | POA: Diagnosis not present

## 2018-12-10 DIAGNOSIS — M5440 Lumbago with sciatica, unspecified side: Secondary | ICD-10-CM

## 2018-12-10 DIAGNOSIS — Z23 Encounter for immunization: Secondary | ICD-10-CM

## 2018-12-10 DIAGNOSIS — Z79899 Other long term (current) drug therapy: Secondary | ICD-10-CM | POA: Diagnosis not present

## 2018-12-10 DIAGNOSIS — E559 Vitamin D deficiency, unspecified: Secondary | ICD-10-CM | POA: Diagnosis not present

## 2018-12-10 DIAGNOSIS — I739 Peripheral vascular disease, unspecified: Secondary | ICD-10-CM

## 2018-12-10 NOTE — Patient Instructions (Addendum)
I will have [name] call you to set up your Prolia injection   Our losartan and metoprolol are NOT "as needed."    Return after 3-4 weeks of starting rosuvastatin  Dr Fletcher Anon ASSUMED you wer taking rosuvastatin because it was on your list and you DIDN'T TELL HIM that you weren't taking it

## 2018-12-10 NOTE — Assessment & Plan Note (Signed)
She was again advised to start rosuvastatin

## 2018-12-10 NOTE — Assessment & Plan Note (Signed)
She has received a thoracic cord stimulator  In July 2020 at [facility].  thus far , it has not relieved her pain

## 2018-12-10 NOTE — Progress Notes (Signed)
Subjective:  Patient ID: [name], female    DOB: Aug 24, 1940  Age: 78 y.o. MRN: [id number]  CC: The primary encounter diagnosis was Long-term use of high-risk medication. Diagnoses of Vitamin D deficiency, Need for immunization against influenza, Chronic bilateral low back pain with sciatica, sciatica laterality unspecified, and PAD (peripheral artery disease) ([place]) were also pertinent to this visit.  HPI [name] presents for follow up on osteoporosis , hypertension  tobacco abuse and chronic  Back pain   S/p implantation of thoracic spinal cord  Stimulator in July at [facility]. Pain in legs and lower back has not improved .  She is disappointed.  Now using muscle relaxers  now along with vicodin 10/325 prescribed  By neurosurgery qty of # 180 per month.   Has been taking 4 ES tylenol daily (2000 mg daily ) plus 2000 mg tyelnol in her vicodin (6 daily)  Settings on stimulator have been periodically adjusted by patent via remote control directed by staff over the phone   Osteoporosis:  Taking calcium and vitamin D  Has decided after today's visit to go forward with Prolia  Has not started statin therapy.  Referred to vascular surgery in march . Renal artery doppler shoed bilateral stenosis < 60% .  Advised to start statin theray,  Has not done so   Saw cardiology .  No changes made,  theu were under the impression she was taking statin   Outpatient Medications Prior to Visit  Medication Sig Dispense Refill  . acetaminophen (TYLENOL) 500 MG tablet Take 500 mg by mouth every 8 (eight) hours as needed.    Marland Kitchen amLODipine (NORVASC) 2.5 MG tablet TAKE 1 TABLET BY MOUTH DAILY 90 tablet 2  . gabapentin (NEURONTIN) 300 MG capsule Three times daily as needed for back pain 90 capsule 5  . HYDROcodone-acetaminophen (NORCO) 10-325 MG tablet Take 1 tablet by mouth every 4 (four) hours as needed for severe pain. Must last 30 days. 180 tablet 0  . [START ON 12/27/2018] HYDROcodone-acetaminophen (NORCO)  10-325 MG tablet Take 1 tablet by mouth every 4 (four) hours as needed for severe pain. Must last 30 days. 180 tablet 0  . [START ON 01/26/2019] HYDROcodone-acetaminophen (NORCO) 10-325 MG tablet Take 1 tablet by mouth every 4 (four) hours as needed for severe pain. Must last 30 days. 180 tablet 0  . losartan (COZAAR) 100 MG tablet Take 1 tablet (100 mg total) by mouth at bedtime. 90 tablet 1  . metoprolol succinate (TOPROL-XL) 25 MG 24 hr tablet Take 1 tablet (25 mg total) by mouth daily. 90 tablet 3  . metoprolol tartrate (LOPRESSOR) 25 MG tablet Take 25 mg by mouth 2 (two) times daily.    . polyethylene glycol (MIRALAX / GLYCOLAX) packet Take 17 g by mouth daily as needed for mild constipation.     . promethazine (PHENERGAN) 12.5 MG tablet Take 1 tablet (12.5 mg total) by mouth every 8 (eight) hours as needed for nausea or vomiting. 30 tablet 2  . rosuvastatin (CRESTOR) 5 MG tablet Take 5 mg by mouth daily.    . Vitamin D, Ergocalciferol, (DRISDOL) 50000 units CAPS capsule TK ONE C PO Q WEEK FOR 8 WEEKS  0   Facility-Administered Medications Prior to Visit  Medication Dose Route Frequency Provider Last Rate Last Dose  . epirubicin (ELLENCE) 50 mg in sodium chloride 0.9 % bladder instillation  50 mg Bladder Instillation Once [name], MD        Review of  Systems;  Patient denies headache, fevers, malaise, unintentional weight loss, skin rash, eye pain, sinus congestion and sinus pain, sore throat, dysphagia,  hemoptysis , cough, dyspnea, wheezing, chest pain, palpitations, orthopnea, edema, abdominal pain, nausea, melena, diarrhea, constipation, flank pain, dysuria, hematuria, urinary  Frequency, nocturia, numbness, tingling, seizures,  Focal weakness, Loss of consciousness,  Tremor, insomnia, depression, anxiety, and suicidal ideation.      Objective:  BP (!) 142/82   Pulse 76   Temp (!) 97.4 F (36.3 C)   Wt 126 lb 6.4 oz (57.3 kg)   SpO2 98%   BMI 19.22 kg/m   BP Readings  from Last 3 Encounters:  12/10/18 (!) 142/82  12/07/18 140/90  07/18/18 (!) 150/100    Wt Readings from Last 3 Encounters:  12/10/18 126 lb 6.4 oz (57.3 kg)  12/07/18 123 lb 12 oz (56.1 kg)  06/07/18 116 lb 6.4 oz (52.8 kg)    General appearance: alert, cooperative and appears stated age Ears: normal TM's and external ear canals both ears Throat: lips, mucosa, and tongue normal; teeth and gums normal Neck: no adenopathy, no carotid bruit, supple, symmetrical, trachea midline and thyroid not enlarged, symmetric, no tenderness/mass/nodules Back: symmetric, no curvature. ROM normal. No CVA tenderness. Lungs: clear to auscultation bilaterally Heart: regular rate and rhythm, S1, S2 normal, no murmur, click, rub or gallop Abdomen: soft, non-tender; bowel sounds normal; no masses,  no organomegaly Pulses: 2+ and symmetric Skin: Skin color, texture, turgor normal. No rashes or lesions Lymph nodes: Cervical, supraclavicular, and axillary nodes normal.  Lab Results  Component Value Date   HGBA1C 4.6 12/27/2013   HGBA1C 5.7 (H) 08/31/2010    Lab Results  Component Value Date   CREATININE 0.73 03/24/2017   CREATININE 0.69 11/02/2016   CREATININE 0.67 10/04/2016    Lab Results  Component Value Date   WBC 7.9 10/02/2015   HGB 13.3 06/30/2017   HCT 39.0 06/30/2017   PLT 209 10/02/2015   GLUCOSE 97 06/30/2017   CHOL 188 03/24/2017   TRIG 156.0 (H) 03/24/2017   HDL 44.40 03/24/2017   LDLCALC 113 (H) 03/24/2017   ALT 17 03/24/2017   AST 25 03/24/2017   NA 136 06/30/2017   K 4.2 06/30/2017   CL 99 03/24/2017   CREATININE 0.73 03/24/2017   BUN 11 03/24/2017   CO2 29 03/24/2017   TSH 0.67 10/10/2016   INR 1.0 04/28/2014   HGBA1C 4.6 12/27/2013    Dg C-arm 1-60 Min-no Report  Result Date: 10/09/2017 Fluoroscopy was utilized by the requesting physician.  No radiographic interpretation.    Assessment & Plan:   Problem List Items Addressed This Visit      Unprioritized    Chronic bilateral low back pain    She has received a thoracic cord stimulator  In July 2020 at [facility].  thus far , it has not relieved her pain       PAD (peripheral artery disease) ([place])    She was again advised to start rosuvastatin      Relevant Medications   metoprolol tartrate (LOPRESSOR) 25 MG tablet    Other Visit Diagnoses    Long-term use of high-risk medication    -  Primary   Relevant Orders   Comprehensive metabolic panel   Vitamin D deficiency       Relevant Orders   VITAMIN D 25 Hydroxy (Vit-D Deficiency, Fractures)   Need for immunization against influenza       Relevant Orders   Flu  Vaccine QUAD High Dose(Fluad) (Completed)      I am having [name] maintain her polyethylene glycol, Vitamin D (Ergocalciferol), metoprolol succinate, gabapentin, promethazine, amLODipine, losartan, rosuvastatin, acetaminophen, HYDROcodone-acetaminophen, HYDROcodone-acetaminophen, HYDROcodone-acetaminophen, and metoprolol tartrate.  No orders of the defined types were placed in this encounter.   There are no discontinued medications.  Follow-up: No follow-ups on file.   [name], MD

## 2018-12-11 LAB — COMPREHENSIVE METABOLIC PANEL
ALT: 21 U/L (ref 0–35)
AST: 27 U/L (ref 0–37)
Albumin: 4.4 g/dL (ref 3.5–5.2)
Alkaline Phosphatase: 84 U/L (ref 39–117)
BUN: 11 mg/dL (ref 6–23)
CO2: 30 mEq/L (ref 19–32)
Calcium: 10 mg/dL (ref 8.4–10.5)
Chloride: 98 mEq/L (ref 96–112)
Creatinine, Ser: 0.55 mg/dL (ref 0.40–1.20)
GFR: 106.42 mL/min (ref >60.00)
Glucose, Bld: 88 mg/dL (ref 70–99)
Potassium: 4.2 mEq/L (ref 3.5–5.1)
Sodium: 134 mEq/L — ABNORMAL LOW (ref 135–145)
Total Bilirubin: 0.4 mg/dL (ref 0.2–1.2)
Total Protein: 6.6 g/dL (ref 6.0–8.3)

## 2018-12-11 LAB — VITAMIN D 25 HYDROXY (VIT D DEFICIENCY, FRACTURES): VITD: 34.89 ng/mL (ref 30.00–100.00)

## 2018-12-13 NOTE — Progress Notes (Signed)
Remote ICD transmission.   

## 2018-12-25 DIAGNOSIS — M810 Age-related osteoporosis without current pathological fracture: Secondary | ICD-10-CM

## 2018-12-27 ENCOUNTER — Telehealth: Payer: Self-pay

## 2018-12-27 NOTE — Telephone Encounter (Signed)
Copied from [place] [id number]. Topic: General - Other >> Dec 27, 2018  9:24 AM Keene Breath wrote: Reason for CRM: Patient is returning a call to [facility] regarding a Prolia shot.  Tried the office, but no answer.  Please call back at [phone]

## 2019-01-01 NOTE — Telephone Encounter (Signed)
Patient called in stating she would like to speak with [name]. Please advise.

## 2019-01-02 NOTE — Telephone Encounter (Signed)
You may charge for for each prior authorization you did on Prolia given the considerable time that has been spent  On getting it for her.

## 2019-01-02 NOTE — Telephone Encounter (Signed)
Finally able to reach patient she has decided not to take the Prolia injections again stated she may sometime in the future decide to try the prolia will continue Vit D and Calcium for now

## 2019-01-02 NOTE — Telephone Encounter (Signed)
Pt called stating she is still waiting on a call from [facility]. Please advise.

## 2019-01-03 NOTE — Telephone Encounter (Signed)
Charges filed

## 2019-01-10 DIAGNOSIS — M5441 Lumbago with sciatica, right side: Secondary | ICD-10-CM | POA: Diagnosis not present

## 2019-01-10 DIAGNOSIS — M5030 Other cervical disc degeneration, unspecified cervical region: Secondary | ICD-10-CM | POA: Diagnosis not present

## 2019-01-10 DIAGNOSIS — Z950 Presence of cardiac pacemaker: Secondary | ICD-10-CM | POA: Diagnosis not present

## 2019-01-10 DIAGNOSIS — M47812 Spondylosis without myelopathy or radiculopathy, cervical region: Secondary | ICD-10-CM | POA: Diagnosis not present

## 2019-01-10 DIAGNOSIS — M5442 Lumbago with sciatica, left side: Secondary | ICD-10-CM | POA: Diagnosis not present

## 2019-01-10 DIAGNOSIS — Z9689 Presence of other specified functional implants: Secondary | ICD-10-CM | POA: Diagnosis not present

## 2019-01-10 DIAGNOSIS — G8929 Other chronic pain: Secondary | ICD-10-CM | POA: Diagnosis not present

## 2019-01-11 ENCOUNTER — Other Ambulatory Visit: Payer: Self-pay

## 2019-01-11 ENCOUNTER — Other Ambulatory Visit (INDEPENDENT_AMBULATORY_CARE_PROVIDER_SITE_OTHER): Payer: Medicare Other

## 2019-01-11 DIAGNOSIS — M810 Age-related osteoporosis without current pathological fracture: Secondary | ICD-10-CM

## 2019-01-11 DIAGNOSIS — E782 Mixed hyperlipidemia: Secondary | ICD-10-CM

## 2019-01-11 LAB — COMPREHENSIVE METABOLIC PANEL
ALT: 23 U/L (ref 0–35)
AST: 31 U/L (ref 0–37)
Albumin: 4.5 g/dL (ref 3.5–5.2)
Alkaline Phosphatase: 79 U/L (ref 39–117)
BUN: 10 mg/dL (ref 6–23)
CO2: 27 mEq/L (ref 19–32)
Calcium: 9.5 mg/dL (ref 8.4–10.5)
Chloride: 95 mEq/L — ABNORMAL LOW (ref 96–112)
Creatinine, Ser: 0.54 mg/dL (ref 0.40–1.20)
GFR: 108.68 mL/min (ref >60.00)
Glucose, Bld: 99 mg/dL (ref 70–99)
Potassium: 4.4 mEq/L (ref 3.5–5.1)
Sodium: 129 mEq/L — ABNORMAL LOW (ref 135–145)
Total Bilirubin: 0.5 mg/dL (ref 0.2–1.2)
Total Protein: 6.6 g/dL (ref 6.0–8.3)

## 2019-01-11 LAB — VITAMIN D 25 HYDROXY (VIT D DEFICIENCY, FRACTURES): VITD: 36.17 ng/mL (ref 30.00–100.00)

## 2019-01-11 LAB — LIPID PANEL
Cholesterol: 120 mg/dL (ref 0–200)
HDL: 56.9 mg/dL (ref >39.00)
LDL Cholesterol: 48 mg/dL (ref 0–99)
NonHDL: 63.24
Total CHOL/HDL Ratio: 2
Triglycerides: 76 mg/dL (ref 0.0–149.0)
VLDL: 15.2 mg/dL (ref 0.0–40.0)

## 2019-01-18 DIAGNOSIS — C678 Malignant neoplasm of overlapping sites of bladder: Secondary | ICD-10-CM | POA: Diagnosis not present

## 2019-01-29 ENCOUNTER — Other Ambulatory Visit: Payer: Self-pay | Admitting: Urology

## 2019-01-31 ENCOUNTER — Other Ambulatory Visit: Payer: Self-pay | Admitting: Urology

## 2019-01-31 MED ORDER — GEMCITABINE CHEMO FOR BLADDER INSTILLATION 2000 MG: 2000.0000 mg | Freq: Once | INTRAVENOUS | Status: AC

## 2019-02-07 ENCOUNTER — Ambulatory Visit
Payer: Medicare Other | Attending: Student in an Organized Health Care Education/Training Program | Admitting: Student in an Organized Health Care Education/Training Program

## 2019-02-07 ENCOUNTER — Encounter: Payer: Self-pay | Admitting: Student in an Organized Health Care Education/Training Program

## 2019-02-07 ENCOUNTER — Other Ambulatory Visit: Payer: Self-pay

## 2019-02-07 VITALS — BP 155/70 | HR 68 | Temp 97.3°F | Resp 18 | Ht 68.0 in | Wt 125.0 lb

## 2019-02-07 DIAGNOSIS — M48062 Spinal stenosis, lumbar region with neurogenic claudication: Secondary | ICD-10-CM

## 2019-02-07 DIAGNOSIS — G894 Chronic pain syndrome: Secondary | ICD-10-CM | POA: Diagnosis not present

## 2019-02-07 DIAGNOSIS — M47816 Spondylosis without myelopathy or radiculopathy, lumbar region: Secondary | ICD-10-CM

## 2019-02-07 DIAGNOSIS — M5136 Other intervertebral disc degeneration, lumbar region: Secondary | ICD-10-CM

## 2019-02-07 DIAGNOSIS — Z9689 Presence of other specified functional implants: Secondary | ICD-10-CM | POA: Diagnosis not present

## 2019-02-07 DIAGNOSIS — M5416 Radiculopathy, lumbar region: Secondary | ICD-10-CM | POA: Diagnosis not present

## 2019-02-07 MED ORDER — OXYCODONE-ACETAMINOPHEN 10-325 MG PO TABS: 1.0000 | ORAL_TABLET | Freq: Four times a day (QID) | ORAL | 0 refills | Status: DC | PRN

## 2019-02-07 NOTE — Progress Notes (Signed)
Patient's Name: [name]  MRN: [id number]  Referring Provider: [name], MD  DOB: [birth year]  PCP: Bianca Mc, MD  DOS: 02/07/2019  Note by: Bianca Santa, MD  Service setting: Ambulatory outpatient  Attending: [name], MD  [name]  Specialty: Interventional Pain Management  Patient type: Established   Primary Reason(s) for Visit: Encounter for prescription drug management. (Level of risk: moderate)  CC: Back Pain  HPI  [name] is a 77 y.o. year old, female patient, who comes today for a medication management evaluation. She has Hyponatremia; Bladder cancer ([name]); LBBB (left bundle branch block); Atrial ectopy; Congestive dilated cardiomyopathy ([name]); History of gastric ulcer; Spinal stenosis of thoracolumbar region; COPD (chronic obstructive pulmonary disease) ([name]); Constipation; Spinal stenosis in cervical region; Chronic bilateral low back pain; NSAID induced gastritis; Parkinsonian features; Hypertension; Spinal stenosis, lumbar region, with neurogenic claudication; Other idiopathic scoliosis, lumbar region; Hyperlipidemia; S/P placement of cardiac pacemaker; Coronary artery disease; Lumbar spondylosis; SI joint arthritis; Lumbar degenerative disc disease; Chronic pain syndrome; Lumbar radiculopathy; Unintentional weight loss; PAD (peripheral artery disease) ([name]); Educated about COVID-19 virus infection; Osteoporosis; and Lumbar facet arthropathy on their problem list. Her primarily concern today is the Back Pain  Pain Assessment: Location: Left, Right Back Radiating: Back radiaties down back of both leg Onset: More than a month ago Duration: Chronic pain Quality: Aching, Burning Severity: 7 /10 (subjective, self-reported pain score)   Effect on ADL: limits my daily actvities Timing: Constant Modifying factors: meds BP: (!) 155/70  HR: 68  [name] was last scheduled for an appointment on 11/15/2018 for medication management.  During today's appointment we reviewed [name] chronic pain status, as well as her outpatient medication regimen.  Patient follows up today for medication management, she is status post spinal cord stimulator implant with Navratil performed by [name] at [name]  She states that the stimulator is not providing her with significant benefit as she had experienced during the trial after the first few weeks after her procedure.  She is working with the nephro representatives to reprogram.  She states that the hydrocodone has become less effective.  We discussed opioid rotation at similar MME to Percocet.  Details discussed regarding rationale for opioid rotation.  Patient would like to try.  The patient  reports no history of drug use. Her body mass index is 19.01 kg/m.  Further details on both, my assessment(s), as well as the proposed treatment plan, please see below.  Controlled Substance Pharmacotherapy Assessment REMS (Risk Evaluation and Mitigation Strategy)  Analgesic:  01/27/2019  2   11/15/2018  Hydrocodone-Acetamin 10-325 MG  180.00  30 Bi Lat   [id number]   [place] (7587)   0  60.00 MME  Comm Ins   [name]      :Bianca Fischer, RN  02/07/2019  1:38 PM  Sign when Signing Visit Nursing Pain Medication Assessment:  Safety precautions to be maintained throughout the outpatient stay will include: orient to surroundings, keep bed in low position, maintain call bell within reach at all times, provide assistance with transfer out of bed and ambulation.  Medication Inspection Compliance: [name] did not comply with our request to bring her pills to be counted. She was reminded that bringing the medication bottles, even when empty, is a requirement.  Medication: None brought in. Pill/Patch Count: None available to be counted. Bottle Appearance: No container available. Did not bring bottle(s) to appointment. Filled Date: N/A Last Medication intake:  Today  Bianca Fischer, RN  02/07/2019  1:37 PM   Sign when Signing Visit Safety precautions to be maintained throughout the outpatient stay will include: orient to surroundings, keep bed in low position, maintain call bell within reach at all times, provide assistance with transfer out of bed and ambulation.    Pharmacokinetics: Liberation and absorption (onset of action): WNL Distribution (time to peak effect): WNL Metabolism and excretion (duration of action): WNL         Pharmacodynamics: Desired effects: Analgesia: [name] reports <50% benefit. Functional ability: Patient reports that medication does help, but not nearly as much as she would like Clinically meaningful improvement in function (CMIF): Sustained CMIF goals met Perceived effectiveness: Described as relatively effective but with some room for improvement Undesirable effects: Side-effects or Adverse reactions: None reported Monitoring: [place] PMP: PDMP not reviewed this encounter. Online review of the past 72-monthperiod conducted. Compliant with practice rules and regulations Last UDS on record: Summary  Date Value Ref Range Status  02/06/2018 FINAL  Final    Comment:    ==================================================================== TOXASSURE SELECT 13 (MW) ==================================================================== Test                             Result       Flag       Units Drug Present   Hydrocodone                    3616                    ng/mg creat   Hydromorphone                  1604                    ng/mg creat   Dihydrocodeine                 328                     ng/mg creat   Norhydrocodone                 5344                    ng/mg creat    Sources of hydrocodone include scheduled prescription    medications. Hydromorphone, dihydrocodeine and norhydrocodone are    expected metabolites of hydrocodone. Hydromorphone and    dihydrocodeine are also available as scheduled prescription     medications. ==================================================================== Test                      Result    Flag   Units      Ref Range   Creatinine              25               mg/dL      >=20 ==================================================================== Declared Medications:  Medication list was not provided. ==================================================================== For clinical consultation, please call ([phone] ====================================================================    UDS interpretation: Compliant          Medication Assessment Form: Reviewed. Patient indicates being compliant with therapy Treatment compliance: Compliant Risk Assessment Profile: Aberrant behavior: See initial evaluations. None observed or detected today Comorbid factors increasing risk of overdose: See initial evaluation. No additional risks detected today Opioid risk  tool (ORT):  Opioid Risk  05/08/2018  Alcohol 0  Illegal Drugs 0  Rx Drugs 0  Alcohol 0  Illegal Drugs 0  Rx Drugs 0  Age between 16-45 years  0  History of Preadolescent Sexual Abuse 0  Psychological Disease 0  Depression 0  Opioid Risk Tool Scoring 0  Opioid Risk Interpretation Low Risk    ORT Scoring interpretation table:  Score <3 = Low Risk for SUD  Score between 4-7 = Moderate Risk for SUD  Score >8 = High Risk for Opioid Abuse   Risk of substance use disorder (SUD): Low  Risk Mitigation Strategies:  Patient Counseling: Covered Patient-Prescriber Agreement (PPA): Present and active  Notification to other healthcare providers: Done  Pharmacologic Plan: Opioid rotation to Percocet at equal MME             Laboratory Chemistry Profile   Screening Lab Results  Component Value Date   STAPHAUREUS NEGATIVE 05/07/2014   MRSAPCR NEGATIVE 05/07/2014    Renal Lab Results  Component Value Date   BUN 10 01/11/2019   CREATININE 0.54 01/11/2019   BCR 18 04/28/2014   GFR 108.68  01/11/2019   GFRAA >60 10/02/2015   GFRNONAA >60 10/02/2015                             Hepatic Lab Results  Component Value Date   AST 31 01/11/2019   ALT 23 01/11/2019   ALBUMIN 4.5 01/11/2019   ALKPHOS 79 01/11/2019   LIPASE 30 11/23/2013                        Electrolytes Lab Results  Component Value Date   NA 129 (L) 01/11/2019   K 4.4 01/11/2019   CL 95 (L) 01/11/2019   CALCIUM 9.5 01/11/2019                        Neuropathy Lab Results  Component Value Date   VITAMINB12 1,069 (H) 12/27/2013   HGBA1C 4.6 12/27/2013                        CNS No results found for: COLORCSF, APPEARCSF, RBCCOUNTCSF, WBCCSF, POLYSCSF, LYMPHSCSF, EOSCSF, PROTEINCSF, GLUCCSF, JCVIRUS, CSFOLI, IGGCSF, LABACHR, ACETBL                      Bone Lab Results  Component Value Date   VD25OH 36.17 01/11/2019                         Coagulation Lab Results  Component Value Date   INR 1.0 04/28/2014   LABPROT 10.3 04/28/2014   APTT 27 11/22/2013   PLT 209 10/02/2015                        Cardiovascular Lab Results  Component Value Date   CKTOTAL 87 09/01/2010   CKMB 4.1 (H) 09/01/2010   TROPONINI <0.30 11/23/2013   HGB 13.3 06/30/2017   HCT 39.0 06/30/2017                         ID Lab Results  Component Value Date   STAPHAUREUS NEGATIVE 05/07/2014   MRSAPCR NEGATIVE 05/07/2014    Endocrine Lab Results  Component Value Date   TSH 0.67  10/10/2016   FREET4 1.48 11/27/2013                        Note: Lab results reviewed.  Meds   Current Outpatient Medications:  .  acetaminophen (TYLENOL) 500 MG tablet, Take 500 mg by mouth every 8 (eight) hours as needed., Disp: , Rfl:  .  amLODipine (NORVASC) 2.5 MG tablet, TAKE 1 TABLET BY MOUTH DAILY, Disp: 90 tablet, Rfl: 2 .  gabapentin (NEURONTIN) 300 MG capsule, Three times daily as needed for back pain, Disp: 90 capsule, Rfl: 5 .  HYDROcodone-acetaminophen (NORCO) 10-325 MG tablet, Take 1 tablet by mouth every 4 (four)  hours as needed for severe pain. Must last 30 days., Disp: 180 tablet, Rfl: 0 .  losartan (COZAAR) 100 MG tablet, Take 1 tablet (100 mg total) by mouth at bedtime., Disp: 90 tablet, Rfl: 1 .  metoprolol succinate (TOPROL-XL) 25 MG 24 hr tablet, Take 1 tablet (25 mg total) by mouth daily., Disp: 90 tablet, Rfl: 3 .  polyethylene glycol (MIRALAX / GLYCOLAX) packet, Take 17 g by mouth daily as needed for mild constipation. , Disp: , Rfl:  .  promethazine (PHENERGAN) 12.5 MG tablet, Take 1 tablet (12.5 mg total) by mouth every 8 (eight) hours as needed for nausea or vomiting., Disp: 30 tablet, Rfl: 2 .  rosuvastatin (CRESTOR) 5 MG tablet, Take 5 mg by mouth daily., Disp: , Rfl:  .  metoprolol tartrate (LOPRESSOR) 25 MG tablet, Take 25 mg by mouth 2 (two) times daily., Disp: , Rfl:  .  [START ON 02/26/2019] oxyCODONE-acetaminophen (PERCOCET) 10-325 MG tablet, Take 1 tablet by mouth every 6 (six) hours as needed for pain. Must last 30 days., Disp: 120 tablet, Rfl: 0 .  [START ON 03/28/2019] oxyCODONE-acetaminophen (PERCOCET) 10-325 MG tablet, Take 1 tablet by mouth every 6 (six) hours as needed for pain. Must last 30 days., Disp: 120 tablet, Rfl: 0 .  Vitamin D, Ergocalciferol, (DRISDOL) 50000 units CAPS capsule, TK ONE C PO Q WEEK FOR 8 WEEKS, Disp: , Rfl: 0 No current facility-administered medications for this visit.   Facility-Administered Medications Ordered in Other Visits:  .  epirubicin (ELLENCE) 50 mg in sodium chloride 0.9 % bladder instillation, 50 mg, Bladder Instillation, Once, [name], MD .  gemcitabine [facility]) chemo syringe for bladder instillation 2,000 mg, 2,000 mg, Bladder Instillation, Once, [name], MD  ROS  Constitutional: Denies any fever or chills Gastrointestinal: No reported hemesis, hematochezia, vomiting, or acute GI distress Musculoskeletal: Denies any acute onset joint swelling, redness, loss of ROM, or weakness Neurological: No reported episodes of acute  onset apraxia, aphasia, dysarthria, agnosia, amnesia, paralysis, loss of coordination, or loss of consciousness  Allergies  Ms. Desai has No Known Allergies.  [place]  Drug: Ms. Sinopoli  reports no history of drug use. Alcohol:  reports no history of alcohol use. Tobacco:  reports that she has been smoking cigarettes. She has a 27.50 pack-year smoking history. She has never used smokeless tobacco. Medical:  has a past medical history of AICD (automatic cardioverter/defibrillator) present (placement 05/07/2014 medtronic    ), Arthritis, Bladder cancer ([place]) (urologist-  dr Junious Silk), Chronic hyponatremia, Chronic lower back pain, Chronic systolic (congestive) heart failure ([place]), COPD with emphysema ([place]), Coronary artery disease (cardiologist-  dr Kathlyn [place]), DDD (degenerative disc disease), thoracolumbar, Ectopic atrial beats, Frequent urination, Full dentures, Gait instability, GERD (gastroesophageal reflux disease), History of iron deficiency anemia (11/2013), History of stomach ulcers (11/2013), Hypertension, LBBB (  left bundle branch block), NICM (nonischemic cardiomyopathy) ([place]) (last echo 12-31-2015 ef 55-60%), Nocturia more than twice per night, and Scoliosis. Surgical: Ms. Lejeune  has a past surgical history that includes Esophagogastroduodenoscopy (N/A, 11/28/2013); Transurethral resection of bladder tumor (N/A, 12/13/2013); Transurethral resection of bladder tumor (N/A, 01/17/2014); Cataract extraction w/ intraocular lens  implant, bilateral (Bilateral, right 12-2013 / left  02-2014); Cystoscopy with retrograde pyelogram, ureteroscopy and stent placement (Bilateral, 04/18/2014); Transurethral resection of bladder tumor (N/A, 04/18/2014); Lumbar disc surgery (1980's); bi-ventricular implantable cardioverter defibrillator (N/A, 05/07/2014); Cystoscopy w/ retrogrades (Bilateral, 01/06/2015); Cystoscopy with biopsy (N/A, 10/06/2015); Cardiac catheterization (03-03-2014  dr Kathlyn [place]   [facility]); Transurethral  resection of bladder (1995); Cardiovascular stress test (11/25/2013); transthoracic echocardiogram (12/31/2015   dr Caryl Comes); Cystoscopy with fulgeration (N/A, 06/30/2017); and Laminectomy thoracic spine w/ placement spinal cord stimulator. Family: family history includes Cancer in her brother and mother.  Constitutional Exam  General appearance: Well nourished, well developed, and well hydrated. In no apparent acute distress Vitals:   02/07/19 1327  BP: (!) 155/70  Pulse: 68  Resp: 18  Temp: (!) 97.3 F (36.3 C)  TempSrc: Temporal  SpO2: 97%  Weight: 125 lb (56.7 kg)  Height: 5' 8"  (1.727 m)   BMI Assessment: Estimated body mass index is 19.01 kg/m as calculated from the following:   Height as of this encounter: 5' 8"  (1.727 m).   Weight as of this encounter: 125 lb (56.7 kg).  BMI interpretation table: BMI level Category Range association with higher incidence of chronic pain  <18 kg/m2 Underweight   18.5-24.9 kg/m2 Ideal body weight   25-29.9 kg/m2 Overweight Increased incidence by 20%  30-34.9 kg/m2 Obese (Class I) Increased incidence by 68%  35-39.9 kg/m2 Severe obesity (Class II) Increased incidence by 136%  >40 kg/m2 Extreme obesity (Class III) Increased incidence by 254%   Patient's current BMI Ideal Body weight  Body mass index is 19.01 kg/m. Ideal body weight: 63.9 kg (140 lb 14 oz)   BMI Readings from Last 4 Encounters:  02/07/19 19.01 kg/m  12/10/18 19.22 kg/m  12/07/18 18.82 kg/m  06/07/18 17.70 kg/m   Wt Readings from Last 4 Encounters:  02/07/19 125 lb (56.7 kg)  12/10/18 126 lb 6.4 oz (57.3 kg)  12/07/18 123 lb 12 oz (56.1 kg)  06/07/18 116 lb 6.4 oz (52.8 kg)  Psych/Mental status: Alert, oriented x 3 (person, place, & time)       Eyes: PERLA Respiratory: No evidence of acute respiratory distress  Cervical Spine Area Exam  Skin & Axial Inspection: No masses, redness, edema, swelling, or associated skin lesions Alignment: Symmetrical Functional  ROM: Unrestricted ROM      Stability: No instability detected Muscle Tone/Strength: Functionally intact. No obvious neuro-muscular anomalies detected. Sensory (Neurological): Unimpaired Palpation: No palpable anomalies              Upper Extremity (UE) Exam    Side: Right upper extremity  Side: Left upper extremity  Skin & Extremity Inspection: Skin color, temperature, and hair growth are WNL. No peripheral edema or cyanosis. No masses, redness, swelling, asymmetry, or associated skin lesions. No contractures.  Skin & Extremity Inspection: Skin color, temperature, and hair growth are WNL. No peripheral edema or cyanosis. No masses, redness, swelling, asymmetry, or associated skin lesions. No contractures.  Functional ROM: Unrestricted ROM          Functional ROM: Unrestricted ROM          Muscle Tone/Strength: Functionally intact. No obvious neuro-muscular anomalies  detected.  Muscle Tone/Strength: Functionally intact. No obvious neuro-muscular anomalies detected.  Sensory (Neurological): Unimpaired          Sensory (Neurological): Unimpaired          Palpation: No palpable anomalies              Palpation: No palpable anomalies              Provocative Test(s):  Phalen's test: deferred Tinel's test: deferred Apley's scratch test (touch opposite shoulder):  Action 1 (Across chest): deferred Action 2 (Overhead): deferred Action 3 (LB reach): deferred   Provocative Test(s):  Phalen's test: deferred Tinel's test: deferred Apley's scratch test (touch opposite shoulder):  Action 1 (Across chest): deferred Action 2 (Overhead): deferred Action 3 (LB reach): deferred    Thoracic Spine Area Exam  Skin & Axial Inspection: No masses, redness, or swelling Alignment: Symmetrical Functional ROM: Decreased ROM Stability: No instability detected Muscle Tone/Strength: Functionally intact. No obvious neuro-muscular anomalies detected. Sensory (Neurological): Unimpaired Muscle strength & Tone: No  palpable anomalies  Lumbar Spine Area Exam  Skin & Axial Inspection: Well healed scar from previous spine surgery detected IPG located in left buttock.  No redness or skin breakdown appreciated. Alignment: Asymmetric Functional ROM: Decreased ROM affecting both sides Stability: No instability detected Muscle Tone/Strength: Functionally intact. No obvious neuro-muscular anomalies detected. Sensory (Neurological): Dermatomal pain pattern and musculoskeletal   Gait & Posture Assessment  Ambulation: Limited Gait: Limited. Using assistive device to ambulate Posture: Difficulty standing up straight, due to pain   Lower Extremity Exam    Side: Right lower extremity  Side: Left lower extremity  Stability: No instability observed          Stability: No instability observed          Skin & Extremity Inspection: Skin color, temperature, and hair growth are WNL. No peripheral edema or cyanosis. No masses, redness, swelling, asymmetry, or associated skin lesions. No contractures.  Skin & Extremity Inspection: Skin color, temperature, and hair growth are WNL. No peripheral edema or cyanosis. No masses, redness, swelling, asymmetry, or associated skin lesions. No contractures.  Functional ROM: Decreased ROM for hip and knee joints          Functional ROM: Decreased ROM for hip and knee joints          Muscle Tone/Strength: Functionally intact. No obvious neuro-muscular anomalies detected.  Muscle Tone/Strength: Functionally intact. No obvious neuro-muscular anomalies detected.  Sensory (Neurological): Arthropathic arthralgia        Sensory (Neurological): Dermatomal pain pattern        DTR: Patellar: deferred today Achilles: deferred today Plantar: deferred today  DTR: Patellar: deferred today Achilles: deferred today Plantar: deferred today  Palpation: No palpable anomalies  Palpation: No palpable anomalies   Assessment   Status Diagnosis  Controlled Controlled Controlled 1. Chronic pain  syndrome   2. Lumbar radiculopathy   3. Lumbar degenerative disc disease   4. Spinal stenosis, lumbar region, with neurogenic claudication   5. Lumbar facet arthropathy   6. Lumbar spondylosis   7. Spinal cord stimulator status      Updated Problems: Problem  Lumbar Facet Arthropathy   Opioid rotation from Hydrocodone to Percocet, equal MME. Continue to work with [employer] reps with spinal cord stimulation optimization. UDS at next visit.  Plan of Care  Pharmacotherapy (Medications Ordered): Meds ordered this encounter  Medications  . oxyCODONE-acetaminophen (PERCOCET) 10-325 MG tablet    Sig: Take 1 tablet by mouth every 6 (six)  hours as needed for pain. Must last 30 days.    Dispense:  120 tablet    Refill:  0    Chronic Pain. (STOP Act - Not applicable). Fill one day early if closed on scheduled refill date.  Marland Kitchen oxyCODONE-acetaminophen (PERCOCET) 10-325 MG tablet    Sig: Take 1 tablet by mouth every 6 (six) hours as needed for pain. Must last 30 days.    Dispense:  120 tablet    Refill:  0    Chronic Pain. (STOP Act - Not applicable). Fill one day early if closed on scheduled refill date.   Planned follow-up:   Return in about 10 weeks (around 04/18/2019) for Medication Management, virtual.    Recent Visits Date Type Provider Dept  11/15/18 Office Visit Bianca Santa, MD [name]  Showing recent visits within past 90 days and meeting all other requirements   Today's Visits Date Type Provider Dept  02/07/19 Office Visit Bianca Santa, MD [name]  Showing today's visits and meeting all other requirements   Future Appointments Date Type Provider Dept  04/16/19 Appointment Bianca Santa, MD [name]  Showing future appointments within next 90 days and meeting all other requirements   Primary Care Physician: Bianca Mc, MD Location: [facility] Note by: Bianca Santa, MD Date: 02/07/2019; Time: 2:37  PM  Note: This dictation was prepared with Dragon dictation. Any transcriptional errors that may result from this process are unintentional.

## 2019-02-07 NOTE — Progress Notes (Signed)
Safety precautions to be maintained throughout the outpatient stay will include: orient to surroundings, keep bed in low position, maintain call bell within reach at all times, provide assistance with transfer out of bed and ambulation.  

## 2019-02-07 NOTE — Progress Notes (Signed)
Nursing Pain Medication Assessment:  Safety precautions to be maintained throughout the outpatient stay will include: orient to surroundings, keep bed in low position, maintain call bell within reach at all times, provide assistance with transfer out of bed and ambulation.  Medication Inspection Compliance: [name] did not comply with our request to bring her pills to be counted. She was reminded that bringing the medication bottles, even when empty, is a requirement.  Medication: None brought in. Pill/Patch Count: None available to be counted. Bottle Appearance: No container available. Did not bring bottle(s) to appointment. Filled Date: N/A Last Medication intake:  Today

## 2019-02-08 ENCOUNTER — Other Ambulatory Visit
Admission: RE | Admit: 2019-02-08 | Discharge: 2019-02-08 | Disposition: A | Discharge status: Home / Self Care | Payer: Medicare Other | Source: Ambulatory Visit | Attending: Urology | Admitting: Urology

## 2019-02-08 DIAGNOSIS — Z20828 Contact with and (suspected) exposure to other viral communicable diseases: Secondary | ICD-10-CM | POA: Diagnosis not present

## 2019-02-08 DIAGNOSIS — Z01812 Encounter for preprocedural laboratory examination: Secondary | ICD-10-CM | POA: Insufficient documentation

## 2019-02-09 LAB — SARS CORONAVIRUS 2 (TAT 6-24 HRS): SARS Coronavirus 2: NEGATIVE

## 2019-02-11 ENCOUNTER — Other Ambulatory Visit: Payer: Self-pay

## 2019-02-11 ENCOUNTER — Encounter (HOSPITAL_BASED_OUTPATIENT_CLINIC_OR_DEPARTMENT_OTHER): Payer: Self-pay | Admitting: *Deleted

## 2019-02-11 NOTE — Anesthesia Preprocedure Evaluation (Addendum)
Anesthesia Evaluation  Patient identified by MRN, date of birth, ID band Patient awake    Reviewed: Allergy & Precautions, NPO status , Patient's Chart, lab work & pertinent test results, reviewed documented beta blocker date and time   Airway Mallampati: II  TM Distance: >3 FB Neck ROM: Full    Dental  (+) Edentulous Upper, Edentulous Lower   Pulmonary COPD, Current Smoker,    breath sounds clear to auscultation       Cardiovascular hypertension, Pt. on medications and Pt. on home beta blockers + CAD, + Peripheral Vascular Disease and +CHF  + dysrhythmias + Cardiac Defibrillator  Rhythm:Regular Rate:Normal     Neuro/Psych S/p spinal cord stimulator  Neuromuscular disease    GI/Hepatic Neg liver ROS, GERD  ,  Endo/Other  negative endocrine ROS  Renal/GU Bladder CA     Musculoskeletal  (+) Arthritis ,   Abdominal   Peds  Hematology negative hematology ROS (+)   Anesthesia Other Findings   Reproductive/Obstetrics                            Anesthesia Physical Anesthesia Plan  ASA: III  Anesthesia Plan: General   Post-op Pain Management:    Induction: Intravenous  PONV Risk Score and Plan: 2 and Treatment may vary due to age or medical condition, Dexamethasone and Ondansetron  Airway Management Planned: LMA  Additional Equipment:   Intra-op Plan:   Post-operative Plan: Extubation in OR  Informed Consent: I have reviewed the patients History and Physical, chart, labs and discussed the procedure including the risks, benefits and alternatives for the proposed anesthesia with the patient or authorized representative who has indicated his/her understanding and acceptance.     Dental advisory given  Plan Discussed with: CRNA  Anesthesia Plan Comments:        Anesthesia Quick Evaluation

## 2019-02-11 NOTE — H&P (Signed)
Office Visit Report     01/18/2019   --------------------------------------------------------------------------------   [name]. [name]  MRN: [id number]  DOB: 1941/02/10, 78 year old Female  SSN: [id number]   PRIMARY CARE:  Robert L. Maceo Pro, MD  REFERRING:  Georgette Dover, MD  PROVIDER:  [name], M.D.  LOCATION:  [facility] [phone]     --------------------------------------------------------------------------------   CC: I have bladder cancer.  HPI: [name] is a 78 year-old female established patient who is here for bladder cancer.  Her problem was diagnosed approximately 08/19/2013. The bladder cancer was found because of blood in her urine.   Her bladder cancer was treated by removal with scope. Patient denies removal of the entire bladder, radiation, and chemotherapy.   Her last cysto was 09/10/2018.   Her last U/S or CT Scan was 09/19/2016.   History of bladder cancer in 1995 when she saw Dr. Leory Plowman. Recurrence noted in 2015.   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  -Oct 2016 bbx/turbt - multifocal LG Ta ([facility] in pacu);  -Jul 2017 Cysto, bbx - LG Ta - [facility] in PACU  -Apr 2019 small HG and LG Ta recurrence + epirubicin in PACU  -Dec 2019 - fulguration of small posterior recurrence   -Last upper tract - 09/2016 CT; Oct 2016 - bilat RGP's; Jan 2016 bilateral RGP's - normal; Sept 2015 CT Hematuria - negative upper tracts, multiple bladder tumors.   Last BCG: maintenance Jan 2020, reintroduced BCG Jan 2017 x 6   Last cytology: December 2018-clusters/atypical   She has been well. No gross hematuria. Returns for cysto. UA with 3-10 rbc. She had bothersome frequency and urgency. Noc x 3. We will try solifenacin.     ALLERGIES: No Allergies    MEDICATIONS: Metoprolol Succinate 25 mg tablet, extended release 24 hr  Amlodipine Besylate 2.5 mg tablet  Carafate 1 gram tablet  Gabapentin   Hydrocodone-Acetaminophen 10 mg-325 mg tablet Oral  Losartan Potassium 100 mg tablet  Miralax 17 gram/dose powder 0 Oral     GU PSH: Bladder Instill AntiCA Agent - 03/30/2018, 03/23/2018, 03/09/2018, 11/10/2017, 11/03/2017, 10/27/2017, 2019, 2018, 2018, 2018, 2018, 2018, 2018, 2017, 2017, 2017, 2017, 2016, 2016 Cystoscopy - 09/10/2018, 05/23/2018, 01/22/2018, 10/03/2017, 2019, 03/08/2017, 2018, 2017, 2017 Cystoscopy Fulguration - 02/21/2018 Cystoscopy TURBT >5 cm - 2016, 2015, 2015 Cystoscopy TURBT 2-5 cm - 2019, 2017, 2016 Locm 300-399Mg /Ml Iodine,1Ml - 2018       PSH Notes: Bladder Injection Of Cancer Treatment, Cystoscopy With Fulguration Medium Lesion (2-5cm), Cystoscopy With Fulguration Large Lesion (Over 5cm), Bladder Injection Of Cancer Treatment, Cystoscopy With Fulguration Large Lesion (Over 5cm), Bladder Surgery, Back Surgery, Cystoscopy With Fulguration Large Lesion (Over 5cm)   NON-GU PSH: No Non-GU PSH    GU PMH: Urinary Frequency - 09/10/2018 History of bladder cancer (Stable) - 05/23/2018, History of bladder cancer, - 2015 Bladder Cancer Lateral - 01/22/2018, - 10/03/2017, - 2019, - 03/08/2017, - 2018, - 2017, - 2017, - 2017, Malignant neoplasm of lateral wall of urinary bladder, - 2016 Dysuria - 2018 Bladder Cancer overlapping sites, Malignant neoplasm of overlapping sites of bladder - 2017 Bladder Cancer Dome, Malignant neoplasm of dome of bladder - 2016 Bladder Cancer Posterior, Malignant neoplasm of posterior wall of urinary bladder - 2015 Gross hematuria, Gross hematuria - 2015 Urethral Cancer, Malignant neoplasm of bladder neck - 2015    NON-GU PMH: Encounter for general adult medical examination without abnormal findings,  Encounter for preventive health examination - 2017 Bacteriuria, Bacteriuria, asymptomatic - 2016 Other constipation, Chronic constipation - 2015 Personal history of other diseases of the circulatory system, History of cardiac arrhythmia - 2015, History of  hypertension, - 2015, History of cardiac disorder, - 2015 Personal history of other diseases of the digestive system, History of gastric ulcer - 2015 Personal history of other diseases of the musculoskeletal system and connective tissue, History of degenerative disc disease - 2015    FAMILY HISTORY: Colon Cancer - Runs In Family Death - Runs In Family liver cancer - Runs In Family Lung Cancer - Runs In Family   SOCIAL HISTORY: Marital Status: Widowed Preferred Language: English; Ethnicity: Not Hispanic Or Latino; Race: White Current Smoking Status: Patient smokes.  Has never drank.  Does not drink caffeine.     Notes: Current every day smoker, Six children, Alcohol use, Widowed, Caffeine use   REVIEW OF SYSTEMS:    GU Review Female:   Patient reports frequent urination. Patient denies hard to postpone urination, burning /pain with urination, get up at night to urinate, leakage of urine, stream starts and stops, trouble starting your stream, have to strain to urinate, and being pregnant.  Gastrointestinal (Upper):   Patient denies nausea, vomiting, and indigestion/ heartburn.  Gastrointestinal (Lower):   Patient denies diarrhea and constipation.  Constitutional:   Patient denies fever, night sweats, weight loss, and fatigue.  Skin:   Patient denies skin rash/ lesion and itching.  Eyes:   Patient denies blurred vision and double vision.  Ears/ Nose/ Throat:   Patient denies sore throat and sinus problems.  Hematologic/Lymphatic:   Patient denies swollen glands and easy bruising.  Cardiovascular:   Patient denies leg swelling and chest pains.  Respiratory:   Patient denies cough and shortness of breath.  Endocrine:   Patient denies excessive thirst.  Musculoskeletal:   Patient denies back pain and joint pain.  Neurological:   Patient denies headaches and dizziness.  Psychologic:   Patient denies anxiety and depression.   Notes: request a rx    VITAL SIGNS:      01/18/2019 12:39 PM   BP 164/81 mmHg  Heart Rate 70 /min  Temperature 97.5 F / 36.3 C   GU PHYSICAL EXAMINATION:    External Genitalia: No hirsutism, no rash, no scarring, no cyst, no erythematous lesion, no papular lesion, no blanched lesion, no warty lesion. No edema.  Urethra: No tenderness, no mass, no scarring. No hypermobility. No leakage.  Bladder: Normal to palpation, no tenderness, no mass, normal size.   MULTI-SYSTEM PHYSICAL EXAMINATION:    Constitutional: Well-nourished. No physical deformities. Normally developed. Good grooming.  Neck: Neck symmetrical, not swollen. Normal tracheal position.  Respiratory: No labored breathing, no use of accessory muscles.   Cardiovascular: Normal temperature, normal extremity pulses, no swelling, no varicosities.  Neurologic / Psychiatric: Oriented to time, oriented to place, oriented to person. No depression, no anxiety, no agitation.  Gastrointestinal: No mass, no tenderness, no rigidity, non obese abdomen.     PAST DATA REVIEWED:  Source Of History:  Patient   PROCEDURES:         Flexible Cystoscopy - 52000  Risks, benefits, and some of the potential complications of the procedure were discussed at length with the patient including infection, bleeding, voiding discomfort, urinary retention, fever, chills, sepsis, and others. All questions were answered. Informed consent was obtained. Antibiotic prophylaxis was given. Sterile technique and intraurethral analgesia were used. Chaperone - - for exam and cystoscopy.  Meatus:  Normal size. Normal location. Normal condition.  Urethra:  No hypermobility. No leakage.  Ureteral Orifices:  Normal location. Normal size. Normal shape. Effluxed clear urine.  Bladder:  A few posterior wall tumors. No trabeculation. Normal mucosa. No stones.      The lower urinary tract was carefully examined. The procedure was well-tolerated and without complications. Antibiotic instructions were given. Instructions were given to call  the office immediately for bloody urine, difficulty urinating, urinary retention, painful or frequent urination, fever, chills, nausea, vomiting or other illness. The patient stated that she understood these instructions and would comply with them.         Urinalysis w/Scope Dipstick Dipstick Cont'd Micro  Color: Yellow Bilirubin: Neg mg/dL WBC/hpf: NS (Not Seen)  Appearance: Clear Ketones: Neg mg/dL RBC/hpf: 3 - 10/hpf  Specific Gravity: 1.010 Blood: 3+ ery/uL Bacteria: NS (Not Seen)  pH: 7.0 Protein: Neg mg/dL Cystals: NS (Not Seen)  Glucose: Neg mg/dL Urobilinogen: 0.2 mg/dL Casts: NS (Not Seen)    Nitrites: Neg Trichomonas: Not Present    Leukocyte Esterase: Neg leu/uL Mucous: Not Present      Epithelial Cells: NS (Not Seen)      Yeast: NS (Not Seen)      Sperm: Not Present    ASSESSMENT:      ICD-10 Details  1 GU:   Bladder Cancer overlapping sites - C67.8    PLAN:           Schedule Return Visit/Planned Activity: Next Available Appointment - Schedule Surgery          Document Letter(s):  Created for Patient: Clinical Summary         Notes:   We went over the nature risks benefits and alternatives to cystoscopy with bladder biopsy and fulguration possible TURBT and postop installation of gemcitabine. All questions answered and she elects to proceed. We talked about frequency of cystoscopies in the office as well as the nature risks and benefits of BCG maintenance.    * Signed by [name], M.D. on 01/20/19 at 10:50 PM (EST)*     The information contained in this medical record document is considered private and confidential patient information. This information can only be used for the medical diagnosis and/or medical services that are being provided by the patient's selected caregivers. This information can only be distributed outside of the patient's care if the patient agrees and signs waivers of authorization for this information to be sent to an outside source or  route.

## 2019-02-11 NOTE — Progress Notes (Addendum)
ADDENDUM:  Received ICD device orders  Via fax, placed on chart.  ADDENDUM:   Chart reviewed by anesthesia, Konrad Felix PA,  Ok to proceed.   Spoke w/ via phone for pre-op interview--- PT Lab needs dos---- Istat 8              Lab results------ current ekg,/ pacer check in chart/ epic COVID test ------ 02-08-2019 Arrive at ------- 0530 NPO after ------ [place] Medications to take morning of surgery ----- Toprol, Norvasc, Hydrocodone w/ sips of water Diabetic medication ----- n/a  Patient Special Instructions -----  Asked pt to bring spinal cord stimulator control with her dos Pre-Op special Istructions -----  Device orders faxed 02-08-2019 and second request faxed 02-11-2019  Patient verbalized understanding of instructions that were given at this phone interview. Patient denies shortness of breath, chest pain, fever, cough a this phone interview.   Anesthesia Review: hx AICD 02/ 2016 w/ ef 25%, current ef 60-65% 08-31-2018 echo.  Non-obstructive cad,  Chronic systolic chf. COPD/ emphysema, still smokes, has never used oxygen.  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  Pt denies any cardiac / stroke s&s.  PCP:  Dr Deborra Medina (lov 12-10-2018 epic)  Cardiologist :  Dr Rogue Jury (lov 12-07-2018 epic)  EP cardiology:  Dr Caryl Comes (lov 07-03-2018 epic, pt next appt is scheduled 03-26-2019) [place]:  Dr Pecola Leisure (lov in care everywhere)  Chest x-ray : 05-08-2014 epic EKG : 12-07-2018 epic Echo : 08-31-2018 epic Stress test: 11-26-2013 epic Cardiac Cath :  02-21-2014 epic Sleep Study/ CPAP : NO  Blood Thinner/ Instructions Maryjane Hurter Dose: NO ASA / Instructions/ Last Dose :  NO

## 2019-02-12 ENCOUNTER — Ambulatory Visit (HOSPITAL_BASED_OUTPATIENT_CLINIC_OR_DEPARTMENT_OTHER): Payer: Medicare Other | Admitting: Anesthesiology

## 2019-02-12 ENCOUNTER — Encounter (HOSPITAL_BASED_OUTPATIENT_CLINIC_OR_DEPARTMENT_OTHER)
Admission: RE | Disposition: A | Discharge status: Home / Self Care | Payer: Self-pay | Source: Home / Self Care | Attending: Urology

## 2019-02-12 ENCOUNTER — Ambulatory Visit (HOSPITAL_BASED_OUTPATIENT_CLINIC_OR_DEPARTMENT_OTHER)
Admission: RE | Admit: 2019-02-12 | Discharge: 2019-02-12 | Disposition: A | Discharge status: Home / Self Care | Payer: Medicare Other | Attending: Urology | Admitting: Urology

## 2019-02-12 ENCOUNTER — Other Ambulatory Visit: Payer: Self-pay

## 2019-02-12 ENCOUNTER — Encounter (HOSPITAL_BASED_OUTPATIENT_CLINIC_OR_DEPARTMENT_OTHER): Payer: Self-pay | Admitting: Emergency Medicine

## 2019-02-12 DIAGNOSIS — I739 Peripheral vascular disease, unspecified: Secondary | ICD-10-CM | POA: Insufficient documentation

## 2019-02-12 DIAGNOSIS — I5022 Chronic systolic (congestive) heart failure: Secondary | ICD-10-CM | POA: Diagnosis not present

## 2019-02-12 DIAGNOSIS — G709 Myoneural disorder, unspecified: Secondary | ICD-10-CM | POA: Diagnosis not present

## 2019-02-12 DIAGNOSIS — Z801 Family history of malignant neoplasm of trachea, bronchus and lung: Secondary | ICD-10-CM | POA: Insufficient documentation

## 2019-02-12 DIAGNOSIS — C679 Malignant neoplasm of bladder, unspecified: Secondary | ICD-10-CM | POA: Diagnosis not present

## 2019-02-12 DIAGNOSIS — F17200 Nicotine dependence, unspecified, uncomplicated: Secondary | ICD-10-CM | POA: Diagnosis not present

## 2019-02-12 DIAGNOSIS — J449 Chronic obstructive pulmonary disease, unspecified: Secondary | ICD-10-CM | POA: Diagnosis not present

## 2019-02-12 DIAGNOSIS — K219 Gastro-esophageal reflux disease without esophagitis: Secondary | ICD-10-CM | POA: Diagnosis not present

## 2019-02-12 DIAGNOSIS — C671 Malignant neoplasm of dome of bladder: Secondary | ICD-10-CM | POA: Insufficient documentation

## 2019-02-12 DIAGNOSIS — I509 Heart failure, unspecified: Secondary | ICD-10-CM | POA: Diagnosis not present

## 2019-02-12 DIAGNOSIS — Z79899 Other long term (current) drug therapy: Secondary | ICD-10-CM | POA: Insufficient documentation

## 2019-02-12 DIAGNOSIS — I110 Hypertensive heart disease with heart failure: Secondary | ICD-10-CM | POA: Diagnosis not present

## 2019-02-12 DIAGNOSIS — I2510 Atherosclerotic heart disease of native coronary artery without angina pectoris: Secondary | ICD-10-CM | POA: Diagnosis not present

## 2019-02-12 DIAGNOSIS — M1990 Unspecified osteoarthritis, unspecified site: Secondary | ICD-10-CM | POA: Diagnosis not present

## 2019-02-12 DIAGNOSIS — Z8551 Personal history of malignant neoplasm of bladder: Secondary | ICD-10-CM | POA: Insufficient documentation

## 2019-02-12 DIAGNOSIS — Z800 Family history of malignant neoplasm of digestive organs: Secondary | ICD-10-CM | POA: Insufficient documentation

## 2019-02-12 DIAGNOSIS — C678 Malignant neoplasm of overlapping sites of bladder: Secondary | ICD-10-CM

## 2019-02-12 DIAGNOSIS — C674 Malignant neoplasm of posterior wall of bladder: Secondary | ICD-10-CM | POA: Insufficient documentation

## 2019-02-12 HISTORY — DX: Presence of other specified functional implants: Z96.89

## 2019-02-12 HISTORY — DX: Other chronic pain: G89.29

## 2019-02-12 HISTORY — PX: CYSTOSCOPY WITH BIOPSY: SHX5122

## 2019-02-12 LAB — POCT I-STAT, CHEM 8
BUN: 11 mg/dL (ref 8–23)
Calcium, Ion: 1.35 mmol/L (ref 1.15–1.40)
Chloride: 99 mmol/L (ref 98–111)
Creatinine, Ser: 0.5 mg/dL (ref 0.44–1.00)
Glucose, Bld: 95 mg/dL (ref 70–99)
HCT: 42 % (ref 36.0–46.0)
Hemoglobin: 14.3 g/dL (ref 12.0–15.0)
Potassium: 4.2 mmol/L (ref 3.5–5.1)
Sodium: 137 mmol/L (ref 135–145)
TCO2: 26 mmol/L (ref 22–32)

## 2019-02-12 SURGERY — CYSTOSCOPY, WITH BIOPSY
Anesthesia: General

## 2019-02-12 MED ORDER — LIDOCAINE 2% (20 MG/ML) 5 ML SYRINGE
INTRAMUSCULAR | Status: AC
  Filled 2019-02-12: qty 5

## 2019-02-12 MED ORDER — GEMCITABINE CHEMO FOR BLADDER INSTILLATION 2000 MG
2000.0000 mg | Freq: Once | INTRAVENOUS | Status: AC
  Administered 2019-02-12: 09:00:00 2000 mg via INTRAVESICAL
  Filled 2019-02-12: qty 2000

## 2019-02-12 MED ORDER — PHENYLEPHRINE 40 MCG/ML (10ML) SYRINGE FOR IV PUSH (FOR BLOOD PRESSURE SUPPORT)
PREFILLED_SYRINGE | INTRAVENOUS | Status: DC | PRN
  Administered 2019-02-12 (×2): 80 ug via INTRAVENOUS

## 2019-02-12 MED ORDER — IOHEXOL 300 MG/ML  SOLN
INTRAMUSCULAR | Status: DC | PRN
  Administered 2019-02-12: 47 mL

## 2019-02-12 MED ORDER — HYDROCODONE-ACETAMINOPHEN 5-325 MG PO TABS
1.0000 | ORAL_TABLET | Freq: Once | ORAL | Status: AC
  Administered 2019-02-12: 11:00:00 1 via ORAL
  Filled 2019-02-12: qty 1

## 2019-02-12 MED ORDER — CEFAZOLIN SODIUM-DEXTROSE 2-4 GM/100ML-% IV SOLN
2.0000 g | Freq: Once | INTRAVENOUS | Status: AC
  Administered 2019-02-12: 2 g via INTRAVENOUS
  Filled 2019-02-12: qty 100

## 2019-02-12 MED ORDER — ACETAMINOPHEN 500 MG PO TABS
1000.0000 mg | ORAL_TABLET | Freq: Once | ORAL | Status: DC
  Filled 2019-02-12: qty 2

## 2019-02-12 MED ORDER — FENTANYL CITRATE (PF) 100 MCG/2ML IJ SOLN
INTRAMUSCULAR | Status: AC
  Filled 2019-02-12: qty 2

## 2019-02-12 MED ORDER — PROMETHAZINE HCL 25 MG/ML IJ SOLN
6.2500 mg | INTRAMUSCULAR | Status: DC | PRN
  Filled 2019-02-12: qty 1

## 2019-02-12 MED ORDER — FENTANYL CITRATE (PF) 100 MCG/2ML IJ SOLN
INTRAMUSCULAR | Status: DC | PRN
  Administered 2019-02-12 (×2): 25 ug via INTRAVENOUS
  Administered 2019-02-12: 50 ug via INTRAVENOUS

## 2019-02-12 MED ORDER — DEXAMETHASONE SODIUM PHOSPHATE 10 MG/ML IJ SOLN
INTRAMUSCULAR | Status: AC
  Filled 2019-02-12: qty 1

## 2019-02-12 MED ORDER — HYDROCODONE-ACETAMINOPHEN 5-325 MG PO TABS
ORAL_TABLET | ORAL | Status: AC
  Filled 2019-02-12: qty 1

## 2019-02-12 MED ORDER — PROPOFOL 10 MG/ML IV BOLUS
INTRAVENOUS | Status: DC | PRN
  Administered 2019-02-12: 140 mg via INTRAVENOUS

## 2019-02-12 MED ORDER — ONDANSETRON HCL 4 MG/2ML IJ SOLN
INTRAMUSCULAR | Status: AC
  Filled 2019-02-12: qty 2

## 2019-02-12 MED ORDER — LACTATED RINGERS IV SOLN
INTRAVENOUS | Status: DC
  Administered 2019-02-12 (×2): via INTRAVENOUS
  Filled 2019-02-12: qty 1000

## 2019-02-12 MED ORDER — CEFAZOLIN SODIUM-DEXTROSE 2-4 GM/100ML-% IV SOLN
INTRAVENOUS | Status: AC
  Filled 2019-02-12: qty 100

## 2019-02-12 MED ORDER — FENTANYL CITRATE (PF) 100 MCG/2ML IJ SOLN
25.0000 ug | INTRAMUSCULAR | Status: DC | PRN
  Administered 2019-02-12 (×2): 25 ug via INTRAVENOUS
  Filled 2019-02-12: qty 1

## 2019-02-12 MED ORDER — ONDANSETRON HCL 4 MG/2ML IJ SOLN
INTRAMUSCULAR | Status: DC | PRN
  Administered 2019-02-12: 4 mg via INTRAVENOUS

## 2019-02-12 MED ORDER — DEXAMETHASONE SODIUM PHOSPHATE 10 MG/ML IJ SOLN
INTRAMUSCULAR | Status: DC | PRN
  Administered 2019-02-12: 10 mg via INTRAVENOUS

## 2019-02-12 MED ORDER — LIDOCAINE 2% (20 MG/ML) 5 ML SYRINGE
INTRAMUSCULAR | Status: DC | PRN
  Administered 2019-02-12: 80 mg via INTRAVENOUS

## 2019-02-12 MED ORDER — CEPHALEXIN 500 MG PO CAPS: 500.0000 mg | ORAL_CAPSULE | Freq: Every day | ORAL | 0 refills | Status: DC

## 2019-02-12 MED ORDER — PROPOFOL 10 MG/ML IV BOLUS
INTRAVENOUS | Status: AC
  Filled 2019-02-12: qty 20

## 2019-02-12 SURGICAL SUPPLY — 27 items
BAG DRAIN URO-CYSTO SKYTR STRL (DRAIN) ×3 IMPLANT
BAG DRN RND TRDRP ANRFLXCHMBR (UROLOGICAL SUPPLIES) ×1
BAG DRN UROCATH (DRAIN) ×1
BAG URINE DRAIN 2000ML AR STRL (UROLOGICAL SUPPLIES) ×2 IMPLANT
BAG URINE LEG 500ML (DRAIN) IMPLANT
CATH FOLEY 2WAY SLVR  5CC 16FR (CATHETERS)
CATH FOLEY 2WAY SLVR  5CC 18FR (CATHETERS) ×2
CATH FOLEY 2WAY SLVR 5CC 16FR (CATHETERS) IMPLANT
CATH FOLEY 2WAY SLVR 5CC 18FR (CATHETERS) IMPLANT
CATH URET 5FR 28IN CONE TIP (BALLOONS) ×2
CATH URET 5FR 70CM CONE TIP (BALLOONS) IMPLANT
CLOTH BEACON ORANGE TIMEOUT ST (SAFETY) ×3 IMPLANT
ELECT REM PT RETURN 9FT ADLT (ELECTROSURGICAL) ×3
ELECTRODE REM PT RTRN 9FT ADLT (ELECTROSURGICAL) ×1 IMPLANT
GLOVE BIO SURGEON STRL SZ7.5 (GLOVE) ×3 IMPLANT
GLOVE BIO SURGEON STRL SZ8 (GLOVE) IMPLANT
GOWN STRL REUS W/ TWL XL LVL3 (GOWN DISPOSABLE) ×1 IMPLANT
GOWN STRL REUS W/TWL XL LVL3 (GOWN DISPOSABLE) ×3
KIT TURNOVER CYSTO (KITS) ×3 IMPLANT
LOOP CUT BIPOLAR 24F LRG (ELECTROSURGICAL) ×2 IMPLANT
MANIFOLD NEPTUNE II (INSTRUMENTS) ×3 IMPLANT
NEEDLE HYPO 22GX1.5 SAFETY (NEEDLE) IMPLANT
NS IRRIG 500ML POUR BTL (IV SOLUTION) ×2 IMPLANT
PACK CYSTO (CUSTOM PROCEDURE TRAY) ×3 IMPLANT
TUBE CONNECTING 12'X1/4 (SUCTIONS) ×1
TUBE CONNECTING 12X1/4 (SUCTIONS) ×1 IMPLANT
WATER STERILE IRR 3000ML UROMA (IV SOLUTION) ×3 IMPLANT

## 2019-02-12 NOTE — Anesthesia Procedure Notes (Signed)
Procedure Name: LMA Insertion Date/Time: 02/12/2019 7:42 AM Performed by: Kimber Esterly D, CRNA Pre-anesthesia Checklist: Patient identified, Emergency Drugs available, Suction available and Patient being monitored Patient Re-evaluated:Patient Re-evaluated prior to induction Oxygen Delivery Method: Circle system utilized Preoxygenation: Pre-oxygenation with 100% oxygen Induction Type: IV induction Ventilation: Mask ventilation without difficulty LMA: LMA inserted LMA Size: 4.0 Tube type: Oral Number of attempts: 1 Airway Equipment and Method: Stylet Placement Confirmation: positive ETCO2 and breath sounds checked- equal and bilateral Tube secured with: Tape Dental Injury: Teeth and Oropharynx as per pre-operative assessment

## 2019-02-12 NOTE — Interval H&P Note (Signed)
History and Physical Interval Note:  02/12/2019 7:28 AM  [name]  has presented today for surgery, with the diagnosis of BLADDER CANCER.  The various methods of treatment have been discussed with the patient and family. After consideration of risks, benefits and other options for treatment, the patient has consented to  Procedure(s): CYSTOSCOPY WITH BIOPSY/ FULGURATION/ INSTILLATION OF GEMCITABINE (N/A) as a surgical intervention.  The patient's history has been reviewed, patient examined, no change in status, stable for surgery.  I have reviewed the patient's chart and labs. Also discussed adding bilateral retrograde pyelogram. Questions were answered to the patient's satisfaction.  She is well. No fever or dysuria. She elects to proceed.    [name]

## 2019-02-12 NOTE — Transfer of Care (Signed)
Immediate Anesthesia Transfer of Care Note  Patient: [name]  Procedure(s) Performed: CYSTOSCOPY WITH BIOPSY/ FULGURATION/ INSTILLATION OF GEMCITABINE, bilateral retrograde turbt greater 5cm (N/A )  Patient Location: PACU  Anesthesia Type:General  Level of Consciousness: awake, alert  and oriented  Airway & Oxygen Therapy: Patient Spontanous Breathing and Patient connected to nasal cannula oxygen  Post-op Assessment: Report given to RN and Post -op Vital signs reviewed and stable  Post vital signs: Reviewed and stable  Last Vitals:  Vitals Value Taken Time  BP    Temp    Pulse 60 02/12/19 0844  Resp 8 02/12/19 0844  SpO2 100 % 02/12/19 0844  Vitals shown include unvalidated device data.  Last Pain:  Vitals:   02/12/19 0637  TempSrc: Oral  PainSc: 6       Patients Stated Pain Goal: 6 ([id number])  Complications: No apparent anesthesia complications

## 2019-02-12 NOTE — Op Note (Signed)
Preoperative diagnosis: Bladder cancer Postoperative diagnosis: Bladder cancer  Description of procedure: TURBT greater 2 to 5 cm, bilateral retrograde pyelogram, instillation of gemcitabine in PACU  Surgeon: Junious Silk  Anesthesia: General  Indication for procedure: Ms. Segawa is a 78 year old female with a history of multifocal recurrent superficial bladder cancer.  She had a posterior recurrence noted in the office recently a bit too large for office biopsy and fulguration.  She has been well without dysuria or gross hematuria.  Findings: On exam under anesthesia the vulva and vagina appeared normal.  She has a grade 2 cystocele.  Meatus appeared normal.  On cystoscopy there was a small patch of recurrence lateral to the right UO left posterior bladder but the largest recurrence was posterior bladder and then up at the posterior dome where she has had a lot of recurrences and initially had a lot of tumor a few years ago.  All of these did appear superficial.  Left retrograde pyelogram-this outlined a single ureter single collecting system unit without filling defect, stricture or dilation.  There was brisk drainage.  Right retrograde pyelogram-the ureter appeared normal, there was a small filling defect in the renal pelvis superiorly at the junction with the right superior calyx.  This look like an air bubble because it did appear at one point to move and fill but then reappeared again.  Interestingly, I tried 3 more times to get contrast back up and could not.  It simply effluxed into the bladder.  I even tried a cone-tip catheter without success.  In lieu of ureteroscopy we will plan a CT scan for surveillance down the road.  Description of procedure: After consent was obtained patient brought to the operating room.  After adequate anesthesia she was placed in lithotomy position and prepped and draped in usual sterile fashion.  A timeout was performed to confirm the patient and procedure.  The  cystoscope was passed per urethra and the bladder carefully inspected with a 30 degree and 70 degree lens.  I had to put a finger in the vagina to elevate the cystocele and carefully inspect.  There was clear reflux bilaterally.  Because of the amount of tumor recurrence I thought the loop would be easier.  The cystoscope was removed and the continuous flow sheath passed with the visual obturator and then the handle and loop were passed.  I used an ablative technique to mostly ablate the tumor with the cut and then come back and fulgurated the area.  Most of the tumor swept off the surface.  I got some pieces for pathology.  I tackled at the right lateral and then left posterior tumors and fulgurated these areas.  And then cut and ablated the posterior and posterior dome patch which was the largest.  That was about 4 cm.  Hemostasis was excellent and there was no residual tumor on careful bladder inspection.  The largest chips were collected and sent to pathology.  Much of the tumor was simply ablated.  The scope was removed and an 31 French Foley catheter placed for wake up.  She was awakened and taken to the recovery room in stable condition.    Instillation of gemcitabine in PACU: 2000 mg of gemcitabine was instilled in the PACU and left indwelling for 50 minutes and then drained.  Complications: None  Blood loss: Minimal  Specimens to pathology: Bladder tumor chips-I sent all the tumor chips together  Drains: 68 French Foley catheter - we will plan to have patient  and her family remove the catheter Friday morning over the holiday.  Disposition: Patient stable to PACU

## 2019-02-12 NOTE — Discharge Instructions (Signed)
Elsevier Patient Education  2020 [place], Adult An indwelling urinary catheter is a thin tube that is put into your bladder. The tube helps to drain pee (urine) out of your body. The tube goes in through your urethra. Your urethra is where pee comes out of your body. Your pee will come out through the catheter, then it will go into a bag (drainage bag).  REMOVE THE FOLEY: as instructed on Friday morning, Feb 15, 2019.   Take good care of your catheter so it will work well. How to wear your catheter and bag Supplies needed  Sticky tape (adhesive tape) or a leg strap.  Alcohol wipe or soap and water (if you use tape).  A clean towel (if you use tape).  Large overnight bag.  Smaller bag (leg bag). Wearing your catheter Attach your catheter to your leg with tape or a leg strap.  Make sure the catheter is not pulled tight.  If a leg strap gets wet, take it off and put on a dry strap.  If you use tape to hold the bag on your leg: 1. Use an alcohol wipe or soap and water to wash your skin where the tape made it sticky before. 2. Use a clean towel to pat-dry that skin. 3. Use new tape to make the bag stay on your leg. Wearing your bags You should have been given a large overnight bag.  You may wear the overnight bag in the day or night.  Always have the overnight bag lower than your bladder.  Do not let the bag touch the floor.  Before you go to sleep, put a clean plastic bag in a wastebasket. Then hang the overnight bag inside the wastebasket. You should also have a smaller leg bag that fits under your clothes.  Always wear the leg bag below your knee.  Do not wear your leg bag at night. How to care for your skin and catheter Supplies needed  A clean washcloth.  Water and mild soap.  A clean towel. Caring for your skin and catheter      Clean the skin around your catheter every day: ? Wash your hands with soap and  water. ? Wet a clean washcloth in warm water and mild soap. ? Clean the skin around your urethra. ? If you are female: ? Gently spread the folds of skin around your vagina (labia). ? With the washcloth in your other hand, wipe the inner side of your labia on each side. Wipe from front to back. ? If you are female: ? Pull back any skin that covers the end of your penis (foreskin). ? With the washcloth in your other hand, wipe your penis in small circles. Start wiping at the tip of your penis, then move away from the catheter. ? Move the foreskin back in place, if needed. ? With your free hand, hold the catheter close to where it goes into your body. ? Keep holding the catheter during cleaning so it does not get pulled out. ? With the washcloth in your other hand, clean the catheter. ? Only wipe downward on the catheter. ? Do not wipe upward toward your body. Doing this may push germs into your urethra and cause infection. ? Use a clean towel to pat-dry the catheter and the skin around it. Make sure to wipe off all soap. ? Wash your hands with soap and water.  Shower every day. Do not take baths.  Do  not use cream, ointment, or lotion on the area where the catheter goes into your body, unless your doctor tells you to.  Do not use powders, sprays, or lotions on your genital area.  Check your skin around the catheter every day for signs of infection. Check for: ? Redness, swelling, or pain. ? Fluid or blood. ? Warmth. ? Pus or a bad smell. How to empty the bag Supplies needed  Rubbing alcohol.  Gauze pad or cotton ball.  Tape or a leg strap. Emptying the bag Pour the pee out of your bag when it is ?- full, or at least 2-3 times a day. Do this for your overnight bag and your leg bag. 1. Wash your hands with soap and water. 2. Separate (detach) the bag from your leg. 3. Hold the bag over the toilet or a clean pail. Keep the bag lower than your hips and bladder. This is so the pee  (urine) does not go back into the tube. 4. Open the pour spout. It is at the bottom of the bag. 5. Empty the pee into the toilet or pail. Do not let the pour spout touch any surface. 6. Put rubbing alcohol on a gauze pad or cotton ball. 7. Use the gauze pad or cotton ball to clean the pour spout. 8. Close the pour spout. 9. Attach the bag to your leg with tape or a leg strap. 10. Wash your hands with soap and water. Follow instructions for cleaning the drainage bag:  From the product maker.  As told by your doctor. How to change the bag Supplies needed  Alcohol wipes.  A clean bag.  Tape or a leg strap. Changing the bag Replace your bag when it starts to leak, smell bad, or look dirty. 1. Wash your hands with soap and water. 2. Separate the dirty bag from your leg. 3. Pinch the catheter with your fingers so that pee does not spill out. 4. Separate the catheter tube from the bag tube where these tubes connect (at the connection valve). Do not let the tubes touch any surface. 5. Clean the end of the catheter tube with an alcohol wipe. Use a different alcohol wipe to clean the end of the bag tube. 6. Connect the catheter tube to the tube of the clean bag. 7. Attach the clean bag to your leg with tape or a leg strap. Do not make the bag tight on your leg. 8. Wash your hands with soap and water. General rules   Never pull on your catheter. Never try to take it out. Doing that can hurt you.  Always wash your hands before and after you touch your catheter or bag. Use a mild, fragrance-free soap. If you do not have soap and water, use hand sanitizer.  Always make sure there are no twists or bends (kinks) in the catheter tube.  Always make sure there are no leaks in the catheter or bag.  Drink enough fluid to keep your pee pale yellow.  Do not take baths, swim, or use a hot tub.  If you are female, wipe from front to back after you poop (have a bowel movement). Contact a doctor  if:  Your pee is cloudy.  Your pee smells worse than usual.  Your catheter gets clogged.  Your catheter leaks.  Your bladder feels full. Get help right away if:  You have redness, swelling, or pain where the catheter goes into your body.  You have fluid, blood, pus,  or a bad smell coming from the area where the catheter goes into your body.  Your skin feels warm where the catheter goes into your body.  You have a fever.  You have pain in your: ? Belly (abdomen). ? Legs. ? Lower back. ? Bladder.  You see blood in the catheter.  Your pee is pink or red.  You feel sick to your stomach (nauseous).  You throw up (vomit).  You have chills.  Your pee is not draining into the bag.  Your catheter gets pulled out. Summary  An indwelling urinary catheter is a thin tube that is placed into the bladder to help drain pee (urine) out of the body.  The catheter is placed into the part of the body that drains pee from the bladder (urethra).  Taking good care of your catheter will keep it working properly and help prevent problems.  Always wash your hands before and after touching your catheter or bag.  Never pull on your catheter or try to take it out. This information is not intended to replace advice given to you by your health care provider. Make sure you discuss any questions you have with your health care provider. Document Released: 07/02/2012 Document Revised: 06/29/2018 Document Reviewed: 10/21/2016 Elsevier Patient Education  [place].   Bladder Biopsy, Care After Refer to this sheet in the next few weeks. These instructions provide you with information about caring for yourself after your procedure. Your health care provider may also give you more specific instructions. Your treatment has been planned according to current medical practices, but problems sometimes occur. Call your health care provider if you have any problems or questions after your  procedure. What can I expect after the procedure? After the procedure, it is common to have:  Mild pain in your bladder or kidney area during urination.  Minor burning during urination.  Small amounts of blood in your urine.  A sudden urge to urinate.  A need to urinate more often than usual. Follow these instructions at home: Medicines  Take over-the-counter and prescription medicines only as told by your health care provider.  If you were prescribed an antibiotic medicine, take it as told by your health care provider. Do not stop taking the antibiotic even if you start to feel better. General instructions   Take a warm bath to relieve any burning sensations around your urethra.  Hold a warm, damp washcloth over the urethral area to ease pain.  Return to your normal activities as told by your health care provider. Ask your health care provider what activities are safe for you.  Do not drive for 24 hours if you received a medicine to help you relax (sedative) during your procedure. Ask your health care provider when it is safe for you to drive.  It is your responsibility to get the results of your procedure. Ask your health care provider or the department performing the procedure when your results will be ready.  Keep all follow-up visits as told by your health care provider. This is important. Contact a health care provider if:  You have a fever.  Your symptoms do not improve within 24 hours and you continue to have: ? Burning during urination. ? Increasing amounts of blood in your urine. ? Pain during urination. ? An urgent need to urinate. ? A need to urinate more often than usual. Get help right away if:  You have a lot of bleeding or more bleeding.  You have  severe pain.  You are unable to urinate.  You have bright red blood in your urine.  You are passing blood clots in your urine.  You have a fever.  You have swelling, redness, or pain in your  legs.  You have difficulty breathing. This information is not intended to replace advice given to you by your health care provider. Make sure you discuss any questions you have with your health care provider. Document Released: 03/24/2015 Document Revised: 08/13/2015 Document Reviewed: 03/24/2015 Elsevier Patient Education  [place]    Post Anesthesia Home Care Instructions  Activity: Get plenty of rest for the remainder of the day. A responsible individual must stay with you for 24 hours following the procedure.  For the next 24 hours, DO NOT: -Drive a car -Paediatric nurse -Drink alcoholic beverages -Take any medication unless instructed by your physician -Make any legal decisions or sign important papers.  Meals: Start with liquid foods such as gelatin or soup. Progress to regular foods as tolerated. Avoid greasy, spicy, heavy foods. If nausea and/or vomiting occur, drink only clear liquids until the nausea and/or vomiting subsides. Call your physician if vomiting continues.  Special Instructions/Symptoms: Your throat may feel dry or sore from the anesthesia or the breathing tube placed in your throat during surgery. If this causes discomfort, gargle with warm salt water. The discomfort should disappear within 24 hours.

## 2019-02-13 ENCOUNTER — Encounter (HOSPITAL_BASED_OUTPATIENT_CLINIC_OR_DEPARTMENT_OTHER): Payer: Self-pay | Admitting: Urology

## 2019-02-13 LAB — SURGICAL PATHOLOGY

## 2019-02-13 NOTE — Anesthesia Postprocedure Evaluation (Signed)
Anesthesia Post Note  Patient: [name]  Procedure(s) Performed: CYSTOSCOPY WITH BIOPSY/ FULGURATION/ INSTILLATION OF GEMCITABINE, bilateral retrograde turbt greater 5cm (N/A )     Patient location during evaluation: PACU Anesthesia Type: General Level of consciousness: awake and alert Pain management: pain level controlled Vital Signs Assessment: post-procedure vital signs reviewed and stable Respiratory status: spontaneous breathing, nonlabored ventilation, respiratory function stable and patient connected to nasal cannula oxygen Cardiovascular status: blood pressure returned to baseline and stable Postop Assessment: no apparent nausea or vomiting Anesthetic complications: no    Last Vitals:  Vitals:   02/12/19 1030 02/12/19 1115  BP: (!) 164/70 (!) 144/89  Pulse: 64 (!) 58  Resp: 13 14  Temp:  36.5 C  SpO2: 93% 98%    Last Pain:  Vitals:   02/12/19 1330  TempSrc:   PainSc: 5                  Tiajuana Amass

## 2019-02-27 ENCOUNTER — Ambulatory Visit (INDEPENDENT_AMBULATORY_CARE_PROVIDER_SITE_OTHER): Payer: Medicare Other | Admitting: *Deleted

## 2019-02-27 DIAGNOSIS — I420 Dilated cardiomyopathy: Secondary | ICD-10-CM

## 2019-02-27 LAB — CUP PACEART REMOTE DEVICE CHECK
Battery Remaining Longevity: 24 mo
Battery Voltage: 2.91 V
Brady Statistic AP VP Percent: 50.98 %
Brady Statistic AP VS Percent: 0.82 %
Brady Statistic AS VP Percent: 46.5 %
Brady Statistic AS VS Percent: 1.69 %
Brady Statistic RA Percent Paced: 46.95 %
Brady Statistic RV Percent Paced: 83.65 %
Date Time Interrogation Session: 20201209033523
HighPow Impedance: 68 Ohm
Implantable Lead Implant Date: 20160217
Implantable Lead Implant Date: 20160217
Implantable Lead Implant Date: 20160217
Implantable Lead Location: 753858
Implantable Lead Location: 753859
Implantable Lead Location: 753860
Implantable Lead Model: 4298
Implantable Lead Model: 5076
Implantable Pulse Generator Implant Date: 20160217
Lead Channel Impedance Value: 418 Ohm
Lead Channel Impedance Value: 418 Ohm
Lead Channel Impedance Value: 418 Ohm
Lead Channel Impedance Value: 456 Ohm
Lead Channel Impedance Value: 456 Ohm
Lead Channel Impedance Value: 475 Ohm
Lead Channel Impedance Value: 513 Ohm
Lead Channel Impedance Value: 513 Ohm
Lead Channel Impedance Value: 760 Ohm
Lead Channel Impedance Value: 760 Ohm
Lead Channel Impedance Value: 836 Ohm
Lead Channel Impedance Value: 836 Ohm
Lead Channel Impedance Value: 836 Ohm
Lead Channel Pacing Threshold Amplitude: 0.5 V
Lead Channel Pacing Threshold Amplitude: 0.75 V
Lead Channel Pacing Threshold Amplitude: 1 V
Lead Channel Pacing Threshold Pulse Width: 0.4 ms
Lead Channel Pacing Threshold Pulse Width: 0.4 ms
Lead Channel Pacing Threshold Pulse Width: 0.4 ms
Lead Channel Sensing Intrinsic Amplitude: 18.25 mV
Lead Channel Sensing Intrinsic Amplitude: 18.25 mV
Lead Channel Sensing Intrinsic Amplitude: 2.375 mV
Lead Channel Sensing Intrinsic Amplitude: 2.375 mV
Lead Channel Setting Pacing Amplitude: 2 V
Lead Channel Setting Pacing Amplitude: 2 V
Lead Channel Setting Pacing Amplitude: 2.5 V
Lead Channel Setting Pacing Pulse Width: 0.4 ms
Lead Channel Setting Pacing Pulse Width: 0.4 ms
Lead Channel Setting Sensing Sensitivity: 0.3 mV

## 2019-03-11 ENCOUNTER — Inpatient Hospital Stay: Payer: Medicare Other | Admitting: Oncology

## 2019-03-12 ENCOUNTER — Other Ambulatory Visit: Payer: Self-pay | Admitting: Cardiovascular Disease

## 2019-03-12 DIAGNOSIS — I420 Dilated cardiomyopathy: Secondary | ICD-10-CM

## 2019-03-12 MED ORDER — AMLODIPINE BESYLATE 2.5 MG PO TABS: 2.5000 mg | ORAL_TABLET | Freq: Every day | ORAL | 0 refills | Status: DC

## 2019-03-12 MED ORDER — METOPROLOL SUCCINATE ER 25 MG PO TB24: 25.0000 mg | ORAL_TABLET | Freq: Every day | ORAL | 1 refills | Status: DC

## 2019-03-12 NOTE — Telephone Encounter (Signed)
Requested Prescriptions   Signed Prescriptions Disp Refills   metoprolol succinate (TOPROL-XL) 25 MG 24 hr tablet 30 tablet 1    Sig: Take 1 tablet (25 mg total) by mouth daily.    Authorizing Provider: Kathlyn Sacramento A    Ordering User: Raelene Bott, Makenzy Krist L   amLODipine (NORVASC) 2.5 MG tablet 30 tablet 0    Sig: Take 1 tablet (2.5 mg total) by mouth daily.    Authorizing Provider: Feliz Beam    Ordering User: Raelene Bott, Tymon Nemetz L

## 2019-03-12 NOTE — Telephone Encounter (Signed)
*  STAT* If patient is at the pharmacy, call can be transferred to refill team.   1. Which medications need to be refilled? (please list name of each medication and dose if known)  Metoprolol 25 MG Amlodipine 2.5 MG  2. Which pharmacy/location (including street and city if local pharmacy) is medication to be sent to?  [facility] on [address] near [place]  3. Do they need a 30 day or 90 day supply? 30 day

## 2019-03-19 ENCOUNTER — Encounter: Payer: Self-pay | Admitting: Oncology

## 2019-03-19 ENCOUNTER — Inpatient Hospital Stay: Payer: Medicare Other | Attending: Oncology | Admitting: Oncology

## 2019-03-19 ENCOUNTER — Other Ambulatory Visit: Payer: Self-pay

## 2019-03-19 VITALS — BP 134/90 | HR 76 | Temp 96.5°F | Resp 18 | Ht 68.0 in | Wt 121.3 lb

## 2019-03-19 DIAGNOSIS — C678 Malignant neoplasm of overlapping sites of bladder: Secondary | ICD-10-CM

## 2019-03-19 DIAGNOSIS — I2510 Atherosclerotic heart disease of native coronary artery without angina pectoris: Secondary | ICD-10-CM | POA: Diagnosis not present

## 2019-03-19 DIAGNOSIS — M1990 Unspecified osteoarthritis, unspecified site: Secondary | ICD-10-CM | POA: Insufficient documentation

## 2019-03-19 DIAGNOSIS — Z79899 Other long term (current) drug therapy: Secondary | ICD-10-CM | POA: Diagnosis not present

## 2019-03-19 DIAGNOSIS — J449 Chronic obstructive pulmonary disease, unspecified: Secondary | ICD-10-CM | POA: Diagnosis not present

## 2019-03-19 DIAGNOSIS — I5022 Chronic systolic (congestive) heart failure: Secondary | ICD-10-CM | POA: Diagnosis not present

## 2019-03-19 DIAGNOSIS — F17210 Nicotine dependence, cigarettes, uncomplicated: Secondary | ICD-10-CM | POA: Insufficient documentation

## 2019-03-19 DIAGNOSIS — C679 Malignant neoplasm of bladder, unspecified: Secondary | ICD-10-CM | POA: Diagnosis not present

## 2019-03-19 DIAGNOSIS — K219 Gastro-esophageal reflux disease without esophagitis: Secondary | ICD-10-CM | POA: Diagnosis not present

## 2019-03-19 DIAGNOSIS — I110 Hypertensive heart disease with heart failure: Secondary | ICD-10-CM | POA: Insufficient documentation

## 2019-03-19 NOTE — Progress Notes (Signed)
[place]  [phone]) [phone] [phone]) [phone]  ID: [name] OB: [birth year]  MR#: [id number]  [id number]  Patient Care Team: [name], MD as PCP - General (Internal Medicine) Briscoe Deutscher, MD (Family Medicine)  CHIEF COMPLAINT: Non-invasive bladder cancer.  INTERVAL HISTORY: Patient is a 78 year old female with a longstanding history of noninvasive bladder cancer dating back to 52.  She had a recent recurrence and was referred for intravesicular gemcitabine.  She currently feels well and is asymptomatic.  She has no neurologic complaints.  She denies any recent fevers or illnesses.  She has a good appetite and denies weight loss.  She has no chest pain, shortness of breath, cough, or hemoptysis.  She denies any nausea, vomiting, constipation, or diarrhea.  She has no urinary complaints.  Patient feels at her baseline offers no specific complaints today.  REVIEW OF SYSTEMS:   Review of Systems  Constitutional: Negative.  Negative for fever, malaise/fatigue and weight loss.  Respiratory: Negative.  Negative for cough, hemoptysis and shortness of breath.   Cardiovascular: Negative.  Negative for chest pain and leg swelling.  Gastrointestinal: Negative.  Negative for abdominal pain.  Genitourinary: Negative.  Negative for dysuria, frequency and hematuria.  Musculoskeletal: Negative.  Negative for back pain.  Skin: Negative.  Negative for rash.  Neurological: Negative.  Negative for dizziness, focal weakness, weakness and headaches.  Psychiatric/Behavioral: Negative.  The patient is not nervous/anxious.     As per HPI. Otherwise, a complete review of systems is negative.  PAST MEDICAL HISTORY: Past Medical History:  Diagnosis Date  . AICD (automatic cardioverter/defibrillator) present EP cardiologist--- dr Caryl Comes    placement 05-07-2014 , ef 25%,  NICM/  (02-11-2019 last echo 08-31-2018 ef 60-65%)  . Arthritis    "in about all my joints; for  sure in my back"  . Bladder cancer [facility]) urologist-  dr Junious Silk   dx 1995--  recurrent bladder cancer 2015 , s/p TURBT's and chemo instillation's ;   04/ 2019  s/p TURBT  . Chronic hyponatremia   . Chronic low back pain with bilateral sciatica    s/p  spinal cord stimulator @ [facility]  10-09-2018  . Chronic systolic (congestive) heart failure [facility])    cardiologist-  dr Rogue Jury  . COPD with emphysema ([place])    (02-11-2019  per pt has never been on oxygen)  . Coronary artery disease cardiologist-  dr Kathlyn [place]   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% per cardiac cath 03-03-2014  . DDD (degenerative disc disease), thoracolumbar   . Frequent urination   . Full dentures   . Gait instability    due to chronic low back pain, uses roller walker  . GERD (gastroesophageal reflux disease)   . History of iron deficiency anemia 11/2013   resolved w/ IV Iron  (02-11-2019  per pt has not had any issues since 2015)  . History of stomach ulcers 11/2013  . Hypertension   . LBBB (left bundle branch block)   . NICM (nonischemic cardiomyopathy) ([place]) last echo 12-31-2015 ef 55-60%   dx 09/ 2015 per echo 20%;  myoview 09/ 2015 ef 28%;  per cardiac cath 12/ 2015 ef 30%;    . Nocturia more than twice per night   . S/P insertion of spinal cord stimulator [facility] by [facility]---- Dr Pecola Leisure (notes in care everywhere)   10-09-2018  @[facility] --- thoracic spinal cord stimular/ generator  (device from [facility])---- per pt has a control  . Scoliosis  PAST SURGICAL HISTORY: Past Surgical History:  Procedure Laterality Date  . BI-VENTRICULAR IMPLANTABLE CARDIOVERTER DEFIBRILLATOR N/A 05/07/2014   Procedure: BI-VENTRICULAR IMPLANTABLE CARDIOVERTER DEFIBRILLATOR  (CRT-D);  Surgeon: Deboraha Sprang, MD;  Location: [facility] CATH LAB;  Service: Cardiovascular;  Laterality: N/A;  . CARDIAC CATHETERIZATION  03-03-2014  dr Kathlyn [place]   [facility]   pLAD 20%, pRCA 20%, dRCA 50%, RPLS 50%;  ef 30%, mild elevated LVEDP, mild gradient  across aortic valve LVOT  . CARDIOVASCULAR STRESS TEST  11/25/2013   High risk nuclear study w/ large high severity inferior wall perfusion defect on stress and rest images, large mild severity anteroseptal wall perfusion defect on stress and rest images, No inducible ischemia/ global moderate hypokinesis, ef 28%  . CATARACT EXTRACTION W/ INTRAOCULAR LENS  IMPLANT, BILATERAL Bilateral right 12-2013 / left  02-2014  . CYSTOSCOPY W/ RETROGRADES Bilateral 01/06/2015   Procedure: CYSTOSCOPY WITH  BLADDER BIOPSY BILATERAL RETROGRADE PYELOGRAM,INSTILLATION OF MITOMYCIN C;  Surgeon: [name], MD;  Location: [facility];  Service: Urology;  Laterality: Bilateral;  . CYSTOSCOPY WITH BIOPSY N/A 10/06/2015   Procedure: CYSTO WITH BLADDER BIOPSY, FULGERATION, CHEMO IRRIGATION EPIRUBICIN IN PACU;  Surgeon: [name], MD;  Location: [facility];  Service: Urology;  Laterality: N/A;  . CYSTOSCOPY WITH BIOPSY N/A 02/12/2019   Procedure: CYSTOSCOPY WITH BIOPSY/ FULGURATION/ INSTILLATION OF GEMCITABINE, bilateral retrograde turbt greater 5cm;  Surgeon: [name], MD;  Location[facility];  Service: Urology;  Laterality: N/A;  . CYSTOSCOPY WITH FULGERATION N/A 06/30/2017   Procedure: Marland Kitchen CYSTOSCOPY WITH [place] FULGERATION/ BLADDER BIOPSY/ INSTILLATION OF EPIRUBICIN;  Surgeon: [name], MD;  Location: [facility];  Service: Urology;  Laterality: N/A;  . CYSTOSCOPY WITH RETROGRADE PYELOGRAM, URETEROSCOPY AND STENT PLACEMENT Bilateral 04/18/2014   Procedure: CYSTOSCOPY WITH RETROGRADE PYELOGRAM;  Surgeon: [name], MD;  [facility];  Service: Urology;  Laterality: Bilateral;  . ESOPHAGOGASTRODUODENOSCOPY N/A 11/28/2013   Procedure: ESOPHAGOGASTRODUODENOSCOPY (EGD);  Surgeon: Arta Silence, MD;  Location: [facility] ENDOSCOPY;  Service: Endoscopy;  Laterality: N/A;  . LUMBAR [place]  1980's   "ruptured disc"  . SPINAL CORD STIMULATOR IMPLANT  10-09-2018    @[facility]    Thoracic spinal cord stimular/ genertor  (left flank)----- (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[employer])  . TRANSTHORACIC ECHOCARDIOGRAM  12/31/2015   dr Caryl Comes   ef [id number], grade 1 diastolic dysfunction/ mild MR/ septal motion showed abnormal function and dyssynergy  . TRANSURETHRAL RESECTION OF BLADDER  1995  . TRANSURETHRAL RESECTION OF BLADDER TUMOR N/A 12/13/2013   Procedure: TRANSURETHRAL RESECTION OF BLADDER TUMOR (TURBT);  Surgeon: [name], MD;  [facility];  Service: Urology;  Laterality: N/A;  . TRANSURETHRAL RESECTION OF BLADDER TUMOR N/A 01/17/2014   Procedure: TRANSURETHRAL RESECTION OF BLADDER TUMOR (TURBT);  Surgeon: [name], MD;  [facility];  Service: Urology;  Laterality: N/A;  . TRANSURETHRAL RESECTION OF BLADDER TUMOR N/A 04/18/2014   Procedure: TRANSURETHRAL RESECTION OF BLADDER TUMOR (TURBT), CYSTOGRAM;  Surgeon: [name], MD;  [facility];  Service: Urology;  Laterality: N/A;    FAMILY HISTORY: Family History  Problem Relation Age of Onset  . Cancer Mother   . Cancer Brother     ADVANCED DIRECTIVES (Y/N):  N  HEALTH MAINTENANCE: Social History   Tobacco Use  . Smoking status: Current Every Day Smoker    Packs/day: 0.50    Years: 56.00    Pack years: 28.00    Types: Cigarettes  . Smokeless tobacco: Never Used  . Tobacco comment:  02-11-2019  per pt currently smokes 10 cig's per day , started smoking age 34  Substance Use Topics  . Alcohol use: No  . Drug use: No     Colonoscopy:  PAP:  Bone density:  Lipid panel:  No Known Allergies  Current Outpatient Medications  Medication Sig Dispense Refill  . acetaminophen (TYLENOL) 500 MG tablet Take 500 mg by mouth every 8 (eight) hours as needed.    Marland Kitchen amLODipine (NORVASC) 2.5 MG tablet Take 1 tablet (2.5 mg total) by mouth daily. 30 tablet 0  . gabapentin (NEURONTIN) 300 MG capsule Three times daily as needed for back pain 90 capsule 5  . losartan (COZAAR) 100 MG tablet Take 1  tablet (100 mg total) by mouth at bedtime. 90 tablet 1  . metoprolol succinate (TOPROL-XL) 25 MG 24 hr tablet Take 1 tablet (25 mg total) by mouth daily. 30 tablet 1  . oxyCODONE-acetaminophen (PERCOCET) 10-325 MG tablet Take 1 tablet by mouth every 6 (six) hours as needed for pain. Must last 30 days. 120 tablet 0  . polyethylene glycol (MIRALAX / GLYCOLAX) packet Take 17 g by mouth daily as needed for mild constipation.     . promethazine (PHENERGAN) 12.5 MG tablet Take 1 tablet (12.5 mg total) by mouth every 8 (eight) hours as needed for nausea or vomiting. 30 tablet 2  . rosuvastatin (CRESTOR) 5 MG tablet Take 5 mg by mouth daily.     . solifenacin (VESICARE) 5 MG tablet Take 5 mg by mouth daily.    . Meth-Hyo-M Bl-Na Phos-Ph Sal (URIBEL) 118 MG CAPS TK 1 C PO Q 6 H PRN    . Vitamin D, Ergocalciferol, (DRISDOL) 50000 units CAPS capsule TK ONE C PO Q WEEK FOR 8 WEEKS  0   No current facility-administered medications for this visit.   Facility-Administered Medications Ordered in Other Visits  Medication Dose Route Frequency Provider Last Rate Last Admin  . epirubicin (ELLENCE) 50 mg in sodium chloride 0.9 % bladder instillation  50 mg Bladder Instillation Once [name], MD      . gemcitabine [facility]) chemo syringe for bladder instillation 2,000 mg  2,000 mg Bladder Instillation Once [name], MD        OBJECTIVE: Vitals:   03/19/19 0957  BP: 134/90  Pulse: 76  Resp: 18  Temp: (!) 96.5 F (35.8 C)     Body mass index is 18.44 kg/m.    ECOG FS:0 - Asymptomatic  General: Well-developed, well-nourished, no acute distress. Eyes: Pink conjunctiva, anicteric sclera. HEENT: Normocephalic, moist mucous membranes. Lungs: No audible wheezing or coughing. Heart: Regular rate and rhythm. Abdomen: Soft, nontender, no obvious distention. Musculoskeletal: No edema, cyanosis, or clubbing. Neuro: Alert, answering all questions appropriately. Cranial nerves grossly intact. Skin:  No rashes or petechiae noted. Psych: Normal affect. Lymphatics: No cervical, calvicular, axillary or inguinal LAD.   LAB RESULTS:  Lab Results  Component Value Date   NA 137 02/12/2019   K 4.2 02/12/2019   CL 99 02/12/2019   CO2 27 01/11/2019   GLUCOSE 95 02/12/2019   BUN 11 02/12/2019   CREATININE 0.50 02/12/2019   CALCIUM 9.5 01/11/2019   PROT 6.6 01/11/2019   ALBUMIN 4.5 01/11/2019   AST 31 01/11/2019   ALT 23 01/11/2019   ALKPHOS 79 01/11/2019   BILITOT 0.5 01/11/2019   GFRNONAA >60 10/02/2015   GFRAA >60 10/02/2015    Lab Results  Component Value Date   WBC 7.9 10/02/2015   NEUTROABS 5.3  04/28/2014   HGB 14.3 02/12/2019   HCT 42.0 02/12/2019   MCV 92.0 10/02/2015   PLT 209 10/02/2015     STUDIES: CUP PACEART REMOTE DEVICE CHECK  Result Date: 02/27/2019 Scheduled remote reviewed.  Normal device function.  Next remote 91 days.   ASSESSMENT: Non-invasive bladder cancer.  PLAN:    1. Non-invasive bladder cancer: Patient will benefit from 2000 mg intravesical gemcitabine weekly x6.  Will check UA each week to ensure there is no UTI.  All laboratory work will be monitored by patient's primary urologist.  Patient expressed understanding that if she has any questions, concerns, or side effects from her treatment that she should defer to her primary neurologist.  Patient will return to clinic on March 28, 2019 to initiate cycle 1 of 6 of weekly treatments.  No further follow-up with MD is necessary.  Please refer patient back if she requires retreatment, develops invasive carcinoma, or there are any questions or concerns.   I spent a total of 45 minutes face-to-face with the patient and reviewing chart data of which greater than 50% of the visit was spent in counseling and coordination of care as detailed above.  Patient expressed understanding and was in agreement with this plan. She also understands that She can call clinic at any time with any questions, concerns,  or complaints.   Cancer Staging Bladder cancer [facility]) Staging form: Urinary Bladder, AJCC 7th Edition - Clinical stage from 03/19/2019: Stage 0is (Tis, N0, M0) - Signed by Lloyd Huger, MD on 03/19/2019   Lloyd Huger, MD   03/19/2019 11:10 AM

## 2019-03-19 NOTE — Progress Notes (Signed)
:  New patient visit for bladder cancer, has a history of bladder cancer in 1995. Pt reports has chronic back pain.

## 2019-03-25 ENCOUNTER — Telehealth: Payer: Self-pay | Admitting: Student in an Organized Health Care Education/Training Program

## 2019-03-25 ENCOUNTER — Encounter: Payer: Self-pay | Admitting: Student in an Organized Health Care Education/Training Program

## 2019-03-25 NOTE — Telephone Encounter (Signed)
Patient states she is out of meds and needs to get emergency refill as she has had to take more of the new medication due to pain. Her appt is scheduled 04-15-18. Not sure if she has another script to fill. Please verify what is needed and discuss with patient.

## 2019-03-25 NOTE — Telephone Encounter (Signed)
Ok she is scheduled at 2pm tomorrow 03-26-19

## 2019-03-26 ENCOUNTER — Ambulatory Visit
Payer: Medicare Other | Attending: Student in an Organized Health Care Education/Training Program | Admitting: Student in an Organized Health Care Education/Training Program

## 2019-03-26 ENCOUNTER — Encounter: Payer: Self-pay | Admitting: Internal Medicine

## 2019-03-26 ENCOUNTER — Telehealth: Payer: Self-pay | Admitting: *Deleted

## 2019-03-26 ENCOUNTER — Encounter: Payer: Self-pay | Admitting: Student in an Organized Health Care Education/Training Program

## 2019-03-26 ENCOUNTER — Other Ambulatory Visit: Payer: Self-pay

## 2019-03-26 ENCOUNTER — Ambulatory Visit (INDEPENDENT_AMBULATORY_CARE_PROVIDER_SITE_OTHER): Payer: Medicare Other | Admitting: Internal Medicine

## 2019-03-26 VITALS — BP 140/84 | HR 74 | Resp 18 | Ht 68.0 in | Wt 120.5 lb

## 2019-03-26 DIAGNOSIS — Z95810 Presence of automatic (implantable) cardiac defibrillator: Secondary | ICD-10-CM | POA: Diagnosis not present

## 2019-03-26 DIAGNOSIS — I5022 Chronic systolic (congestive) heart failure: Secondary | ICD-10-CM

## 2019-03-26 DIAGNOSIS — G894 Chronic pain syndrome: Secondary | ICD-10-CM | POA: Diagnosis not present

## 2019-03-26 DIAGNOSIS — M48062 Spinal stenosis, lumbar region with neurogenic claudication: Secondary | ICD-10-CM | POA: Diagnosis not present

## 2019-03-26 DIAGNOSIS — Z9689 Presence of other specified functional implants: Secondary | ICD-10-CM | POA: Diagnosis not present

## 2019-03-26 DIAGNOSIS — I447 Left bundle-branch block, unspecified: Secondary | ICD-10-CM | POA: Diagnosis not present

## 2019-03-26 DIAGNOSIS — M47816 Spondylosis without myelopathy or radiculopathy, lumbar region: Secondary | ICD-10-CM | POA: Diagnosis not present

## 2019-03-26 DIAGNOSIS — I428 Other cardiomyopathies: Secondary | ICD-10-CM | POA: Diagnosis not present

## 2019-03-26 DIAGNOSIS — M5136 Other intervertebral disc degeneration, lumbar region: Secondary | ICD-10-CM | POA: Diagnosis not present

## 2019-03-26 DIAGNOSIS — M5416 Radiculopathy, lumbar region: Secondary | ICD-10-CM

## 2019-03-26 DIAGNOSIS — I420 Dilated cardiomyopathy: Secondary | ICD-10-CM

## 2019-03-26 DIAGNOSIS — M51369 Other intervertebral disc degeneration, lumbar region without mention of lumbar back pain or lower extremity pain: Secondary | ICD-10-CM

## 2019-03-26 MED ORDER — AMITRIPTYLINE HCL 10 MG PO TABS: 10.0000 mg | ORAL_TABLET | Freq: Every day | ORAL | 2 refills | Status: DC

## 2019-03-26 MED ORDER — OXYCODONE-ACETAMINOPHEN 10-325 MG PO TABS: 1.0000 | ORAL_TABLET | Freq: Four times a day (QID) | ORAL | 0 refills | Status: DC | PRN

## 2019-03-26 MED ORDER — METOPROLOL SUCCINATE ER 25 MG PO TB24: 25.0000 mg | ORAL_TABLET | Freq: Every day | ORAL | 3 refills | Status: DC

## 2019-03-26 MED ORDER — AMLODIPINE BESYLATE 2.5 MG PO TABS: 2.5000 mg | ORAL_TABLET | Freq: Every day | ORAL | 3 refills | Status: DC

## 2019-03-26 MED ORDER — OXYCODONE-ACETAMINOPHEN 10-325 MG PO TABS: 1.0000 | ORAL_TABLET | Freq: Four times a day (QID) | ORAL | 0 refills | Status: AC | PRN

## 2019-03-26 NOTE — Progress Notes (Signed)
Electrophysiology Office Note   Date:  03/26/2019   ID:  [name], DOB 06-28-1940, MRN [id number]  PCP:  [name], MD  Cardiologist:  MA Primary Electrophysiologist:  Virl Axe, MD    Chief Complaint  Patient presents with  . Follow-up    Follow up Defib check.     History of Present Illness: [name] is a 79 y.o. female seen in followup  for CRT-D implanted 2/16.   She has a history of a nonischemic cardiomyopathy with ejection fraction 25% and left bundle branch block. She also had tracings in the hospital with the operating QRS in lead V1 suggesting alternating bundle branch block; however those times, the limb leads still demonstrated left bundle branch block  DATE TEST EF   9/15 Myoview 28%   1/16 Cath   % CA min disease  10/17 Echo   55-65 %   6/20 Echo  60-65%     Date Cr K Hgb  7/17 0.55 4.1   1/19 0.73 4.9   3/20 0.8 4.1 12.5  11/20 0.5 4.2 14.3   At [facility] implantation of a neurostimulator for back pain assoc with new pains ( [facility] note 03/08/19) but back pain is better; she remains on narcotics with inadequate relief.  No further orthostatic lightheadedness.  Denies chest pain or shortness of breath or peripheral edema.  Past Medical History:  Diagnosis Date  . AICD (automatic cardioverter/defibrillator) present EP cardiologist--- dr Caryl Comes    placement 05-07-2014 , ef 25%,  NICM/  (02-11-2019 last echo 08-31-2018 ef 60-65%)  . Arthritis    "in about all my joints; for sure in my back"  . Bladder cancer [facility]) urologist-  dr Junious Silk   dx 1995--  recurrent bladder cancer 2015 , s/p TURBT's and chemo instillation's ;   04/ 2019  s/p TURBT  . Chronic hyponatremia   . Chronic low back pain with bilateral sciatica    s/p  spinal cord stimulator @ [facility]  10-09-2018  . Chronic systolic (congestive) heart failure [facility])    cardiologist-  dr Rogue Jury  . COPD with emphysema ([place])    (02-11-2019  per pt has never been on oxygen)  . Coronary  artery disease cardiologist-  dr Kathlyn [place]   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% per cardiac cath 03-03-2014  . DDD (degenerative disc disease), thoracolumbar   . Frequent urination   . Full dentures   . Gait instability    due to chronic low back pain, uses roller walker  . GERD (gastroesophageal reflux disease)   . History of iron deficiency anemia 11/2013   resolved w/ IV Iron  (02-11-2019  per pt has not had any issues since 2015)  . History of stomach ulcers 11/2013  . Hypertension   . LBBB (left bundle branch block)   . NICM (nonischemic cardiomyopathy) ([place]) last echo 12-31-2015 ef 55-60%   dx 09/ 2015 per echo 20%;  myoview 09/ 2015 ef 28%;  per cardiac cath 12/ 2015 ef 30%;    . Nocturia more than twice per night   . S/P insertion of spinal cord stimulator followed by [facility]---- Dr Pecola Leisure (notes in care everywhere)   10-09-2018  @[facility] --- thoracic spinal cord stimular/ generator  (device from [facility])---- per pt has a control  . Scoliosis    Past Surgical History:  Procedure Laterality Date  . BI-VENTRICULAR IMPLANTABLE CARDIOVERTER DEFIBRILLATOR N/A 05/07/2014   Procedure: BI-VENTRICULAR IMPLANTABLE CARDIOVERTER DEFIBRILLATOR  (CRT-D);  Surgeon: Deboraha Sprang, MD;  Location: [facility] CATH LAB;  Service: Cardiovascular;  Laterality: N/A;  . CARDIAC CATHETERIZATION  03-03-2014  dr Kathlyn [place]   [facility]   pLAD 20%, pRCA 20%, dRCA 50%, RPLS 50%;  ef 30%, mild elevated LVEDP, mild gradient across aortic valve LVOT  . CARDIOVASCULAR STRESS TEST  11/25/2013   High risk nuclear study w/ large high severity inferior wall perfusion defect on stress and rest images, large mild severity anteroseptal wall perfusion defect on stress and rest images, No inducible ischemia/ global moderate hypokinesis, ef 28%  . CATARACT EXTRACTION W/ INTRAOCULAR LENS  IMPLANT, BILATERAL Bilateral right 12-2013 / left  02-2014  . CYSTOSCOPY W/ RETROGRADES Bilateral 01/06/2015   Procedure: CYSTOSCOPY WITH   BLADDER BIOPSY BILATERAL RETROGRADE PYELOGRAM,INSTILLATION OF MITOMYCIN C;  Surgeon: [name], MD;  [facility];  Service: Urology;  Laterality: Bilateral;  . CYSTOSCOPY WITH BIOPSY N/A 10/06/2015   Procedure: CYSTO WITH BLADDER BIOPSY, FULGERATION, CHEMO IRRIGATION EPIRUBICIN IN PACU;  Surgeon: [name], MD;  [facility];  Service: Urology;  Laterality: N/A;  . CYSTOSCOPY WITH BIOPSY N/A 02/12/2019   Procedure: CYSTOSCOPY WITH BIOPSY/ FULGURATION/ INSTILLATION OF GEMCITABINE, bilateral retrograde turbt greater 5cm;  Surgeon: [name], MD;  Location[facility];  Service: Urology;  Laterality: N/A;  . CYSTOSCOPY WITH FULGERATION N/A 06/30/2017   Procedure: Marland Kitchen CYSTOSCOPY WITH [place] FULGERATION/ BLADDER BIOPSY/ INSTILLATION OF EPIRUBICIN;  Surgeon: [name], MD;  Location: [facility];  Service: Urology;  Laterality: N/A;  . CYSTOSCOPY WITH RETROGRADE PYELOGRAM, URETEROSCOPY AND STENT PLACEMENT Bilateral 04/18/2014   Procedure: CYSTOSCOPY WITH RETROGRADE PYELOGRAM;  Surgeon: [name], MD;  [facility];  Service: Urology;  Laterality: Bilateral;  . ESOPHAGOGASTRODUODENOSCOPY N/A 11/28/2013   Procedure: ESOPHAGOGASTRODUODENOSCOPY (EGD);  Surgeon: Arta Silence, MD;  Location: [facility] ENDOSCOPY;  Service: Endoscopy;  Laterality: N/A;  . LUMBAR [place]  1980's   "ruptured disc"  . 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 TRANSTHORACIC ECHOCARDIOGRAM  12/31/2015   dr Caryl Comes   ef [id number], grade 1 diastolic dysfunction/ mild MR/ septal motion showed abnormal function and dyssynergy  . TRANSURETHRAL RESECTION OF BLADDER  1995  . TRANSURETHRAL RESECTION OF BLADDER TUMOR N/A 12/13/2013   Procedure: TRANSURETHRAL RESECTION OF BLADDER TUMOR (TURBT);  Surgeon: [name], MD;  [facility];  Service: Urology;   Laterality: N/A;  . TRANSURETHRAL RESECTION OF BLADDER TUMOR N/A 01/17/2014   Procedure: TRANSURETHRAL RESECTION OF BLADDER TUMOR (TURBT);  Surgeon: [name], MD;  [facility];  Service: Urology;  Laterality: N/A;  . TRANSURETHRAL RESECTION OF BLADDER TUMOR N/A 04/18/2014   Procedure: TRANSURETHRAL RESECTION OF BLADDER TUMOR (TURBT), CYSTOGRAM;  Surgeon: [name], MD;  [facility];  Service: Urology;  Laterality: N/A;     Current Outpatient Medications  Medication Sig Dispense Refill  . acetaminophen (TYLENOL) 500 MG tablet Take 500 mg by mouth every 8 (eight) hours as needed.    Marland Kitchen amLODipine (NORVASC) 2.5 MG tablet Take 1 tablet (2.5 mg total) by mouth daily. 30 tablet 0  . gabapentin (NEURONTIN) 300 MG capsule Three times daily as needed for back pain 90 capsule 5  . losartan (COZAAR) 100 MG tablet Take 1 tablet (100 mg total) by mouth at bedtime. 90 tablet 1  . metoprolol succinate (TOPROL-XL) 25 MG 24 hr tablet Take 1 tablet (25 mg total) by mouth  daily. 30 tablet 1  . oxyCODONE-acetaminophen (PERCOCET) 10-325 MG tablet Take 1 tablet by mouth every 6 (six) hours as needed for pain. Must last 30 days. 120 tablet 0  . polyethylene glycol (MIRALAX / GLYCOLAX) packet Take 17 g by mouth daily as needed for mild constipation.     . promethazine (PHENERGAN) 12.5 MG tablet Take 1 tablet (12.5 mg total) by mouth every 8 (eight) hours as needed for nausea or vomiting. 30 tablet 2  . rosuvastatin (CRESTOR) 5 MG tablet Take 5 mg by mouth daily.     . solifenacin (VESICARE) 5 MG tablet Take 5 mg by mouth daily.    . Meth-Hyo-M Bl-Na Phos-Ph Sal (URIBEL) 118 MG CAPS TK 1 C PO Q 6 H PRN    . Vitamin D, Ergocalciferol, (DRISDOL) 50000 units CAPS capsule TK ONE C PO Q WEEK FOR 8 WEEKS  0   No current facility-administered medications for this visit.   Facility-Administered Medications Ordered in Other Visits  Medication Dose Route Frequency Provider Last Rate Last Admin  .  epirubicin (ELLENCE) 50 mg in sodium chloride 0.9 % bladder instillation  50 mg Bladder Instillation Once [name], MD      . gemcitabine [facility]) chemo syringe for bladder instillation 2,000 mg  2,000 mg Bladder Instillation Once [name], MD        Allergies:   Patient has no known allergies.   Social History:  The patient  reports that she has been smoking cigarettes. She has a 28.00 pack-year smoking history. She has never used smokeless tobacco. She reports that she does not drink alcohol or use drugs.   Family History:  The patient's *family history includes Cancer in her brother and mother.    ROS:  Please see the history of present illness.  .   All other systems are reviewed and negative.    PHYSICAL EXAM: VS:  BP 140/84 (BP Location: Left Arm, Patient Position: Sitting, Cuff Size: Normal)   Pulse 74   Resp 18   Ht 5\' 8"  (1.727 m)   Wt 120 lb 8 oz (54.7 kg)   SpO2 98%   BMI 18.32 kg/m  , BMI Body mass index is 18.32 kg/m. Well developed and well nourished in no acute distress HENT normal Neck supple with JVP-flat Clear Device pocket well healed; without hematoma or erythema.  There is no tethering  Regular rate and rhythm, no  murmur Abd-soft with active BS No Clubbing cyanosis  edema Skin-warm and dry A & Oriented  Grossly normal sensory and motor function  ECG sinus rhythm with bigeminal biventricular pacing, i.e. every other beat is conducted.  Recent Labs: 01/11/2019: ALT 23 02/12/2019: BUN 11; Creatinine, Ser 0.50; Hemoglobin 14.3; Potassium 4.2; Sodium 137  Personally reviewed    Lipid Panel     Component Value Date/Time   CHOL 120 01/11/2019 1349   TRIG 76.0 01/11/2019 1349   HDL 56.90 01/11/2019 1349   CHOLHDL 2 01/11/2019 1349   VLDL 15.2 01/11/2019 1349   LDLCALC 48 01/11/2019 1349     Wt Readings from Last 3 Encounters:  03/26/19 120 lb 8 oz (54.7 kg)  03/19/19 121 lb 4.8 oz (55 kg)  02/12/19 124 lb 1 oz (56.3 kg)       Other studies Reviewed: Additional studies/ records that were reviewed today include: cath   Review of the above records today demonstrates: as above   ASSESSMENT AND PLAN:  Cardiomyopathy nonischemic  Congestive heart failure-chronic systolic  COPD/emphysema  Hypertension  Left bundle branch block  PVCs-infrequent  Sinus bradycardia-borderline  CRT-D [employer] The patient's device was interrogated and the information was fully reviewed.  The device was reprogrammed to address the T wave oversensing by decreasing sensitivity from 0.3--0.45 mV.  T wave oversensing resulting in P wave being in refractory and thus not tracked  Gait instability    Euvolemic continue current meds  Device reprogrammed as above.  This may help functional status.  Unfortunately, her biggest problem remains her back pain.  She struggles with inadequate pain relief.  She has undergone stimulator implantation and uses narcotics.  We spent more than 50% of our >30 min visit in face to face counseling regarding the above    Signed, Virl Axe, MD  03/26/2019 11:11 AM     [place] [address] [place] [address] [phone] (office) [phone] (fax)

## 2019-03-26 NOTE — Telephone Encounter (Signed)
Dr. Elwyn Lade note does indicate that patient should continue Gabapentin.

## 2019-03-26 NOTE — Patient Instructions (Signed)
Medication Instructions:  - Your physician recommends that you continue on your current medications as directed. Please refer to the Current Medication list given to you today.  *If you need a refill on your cardiac medications before your next appointment, please call your pharmacy*  Lab Work: - none ordered  If you have labs (blood work) drawn today and your tests are completely normal, you will receive your results only by: . MyChart Message (if you have MyChart) OR . A paper copy in the mail If you have any lab test that is abnormal or we need to change your treatment, we will call you to review the results.  Testing/Procedures: - none ordered  Follow-Up: At [facility], you and your health needs are our priority.  As part of our continuing mission to provide you with exceptional heart care, we have created designated Provider Care Teams.  These Care Teams include your primary Cardiologist (physician) and Advanced Practice Providers (APPs -  Physician Assistants and Nurse Practitioners) who all work together to provide you with the care you need, when you need it.  Your next appointment:   1 year(s)  The format for your next appointment:   In Person  Provider:   Steven Klein, MD  Other Instructions - n/a  

## 2019-03-26 NOTE — Progress Notes (Signed)
Virtual Encounter - Pain Management PROVIDER NOTE: Information contained herein reflects review and annotations entered in association with encounter. Interpretation of such information and data should be left to medically-trained personnel. Information provided to patient can be located elsewhere in the medical record under "Patient Instructions". Document created using STT-dictation technology, any transcriptional errors that may result from process are unintentional.    Contact & Pharmacy Preferred: [phone] Home: [phone] (home) Mobile: [phone] (mobile) E-mail: [email or web address] [name], [place] [address] Phone: [phone] Fax: [phone]   Pre-screening  [name] offered "in-person" vs "virtual" encounter. She indicated preferring virtual for this encounter.   Reason COVID-19*  Social distancing based on CDC and AMA recommendations.   I contacted [name] on 03/26/2019 via telephone.      I clearly identified myself as Bianca Santa, MD. I verified that I was speaking with the correct person using two identifiers (Name: [name], and date of birth: 05-07-1940).  Consent I sought verbal advanced consent from [name] for virtual visit interactions. I informed [name] of possible security and privacy concerns, risks, and limitations associated with providing "not-in-person" medical evaluation and management services. I also informed [name] of the availability of "in-person" appointments. Finally, I informed her that there would be a charge for the virtual visit and that she could be  personally, fully or partially, financially responsible for it. [name] expressed understanding and agreed to proceed.   This visit was completed via telephone due to the restrictions of the COVID-19 pandemic. All issues as above were discussed and addressed  but no physical exam was performed. If it was felt that the patient should be evaluated in the office, they were directed there. The patient verbally consented to this visit. Patient was unable to complete an audio/visual visit due to Technical difficulties and/or Lack of internet. Due to the catastrophic nature of the COVID-19 pandemic, this visit was done through audio contact only.  Location of the patient: home address (see Epic for details)  Location of the provider: office  Historic Elements   [name] is a 79 y.o. year old, female patient evaluated today after her last encounter by our practice on 03/25/2019. [name]  has a past medical history of AICD (automatic cardioverter/defibrillator) present ([place] cardiologist--- dr Caryl Comes ), Arthritis, Bladder cancer [facility] (urologist-  dr Junious Silk), Chronic hyponatremia, Chronic low back pain with bilateral sciatica, Chronic systolic (congestive) heart failure ([place]), COPD with emphysema ([place]), Coronary artery disease (cardiologist-  dr Kathlyn Sacramento), DDD (degenerative disc disease), thoracolumbar, Frequent urination, Full dentures, Gait instability, GERD (gastroesophageal reflux disease), History of iron deficiency anemia (11/2013), History of stomach ulcers (11/2013), Hypertension, LBBB (left bundle branch block), NICM (nonischemic cardiomyopathy) ([place]) (last echo 12-31-2015 ef 55-60%), Nocturia more than twice per night, S/P insertion of spinal cord stimulator (followed by [facility]---- Dr Pecola Leisure (notes in care everywhere)), and Scoliosis. She also  has a past surgical history that includes Esophagogastroduodenoscopy (N/A, 11/28/2013); Transurethral resection of bladder tumor (N/A, 12/13/2013); Transurethral resection of bladder tumor (N/A, 01/17/2014); Cataract extraction w/ intraocular lens  implant, bilateral (Bilateral, right 12-2013 / left  02-2014); Cystoscopy with retrograde pyelogram, ureteroscopy and stent placement (Bilateral,  04/18/2014); Transurethral resection of bladder tumor (N/A, 04/18/2014); Lumbar disc surgery (1980's); bi-ventricular implantable cardioverter defibrillator (N/A, 05/07/2014); Cystoscopy w/ retrogrades (Bilateral, 01/06/2015); Cystoscopy with  biopsy (N/A, 10/06/2015); Cardiac catheterization (03-03-2014  dr Kathlyn Sacramento   [facility]); Transurethral resection of bladder (1995); Cardiovascular stress test (11/25/2013); transthoracic echocardiogram (12/31/2015   dr Caryl Comes); Cystoscopy with fulgeration (N/A, 06/30/2017); Spinal cord stimulator implant (10-09-2018   @[facility] ); and Cystoscopy with biopsy (N/A, 02/12/2019). [name] has a current medication list which includes the following prescription(s): acetaminophen, gabapentin, losartan, uribel, polyethylene glycol, promethazine, rosuvastatin, solifenacin, vitamin d (ergocalciferol), amitriptyline, amlodipine, metoprolol succinate, oxycodone-acetaminophen, [START ON 04/25/2019] oxycodone-acetaminophen, and [START ON 05/25/2019] oxycodone-acetaminophen, and the following Facility-Administered Medications: epirubicin (ELLENCE) 50 mg in sodium chloride 0.9 % bladder instillation and gemcitabine. She  reports that she has been smoking cigarettes. She has a 28.00 pack-year smoking history. She has never used smokeless tobacco. She reports that she does not drink alcohol or use drugs. [name] has No Known Allergies.   HPI  Today, she is being contacted for medication management.    had a visit at [facility] on December 18 to try and optimize SCS settings. Revision of settings has improved some component of her low back pain but not as much as she hoped. I explained to her the aspects of her pain that SCS is likely going to improve. I also explained the aspects of her pain that will likely be resistant to neuromodulation. Had an extensive discussion regarding goals of pain management.  I understand that the patient is somewhat frustrated and would like to optimize her pain management but  she has tried and failed many oral pharmacologic and interventional pain therapies.  I do believe that spinal cord stimulation is helping her pain to a certain extent and I believe the patient had greater expectations for the amount of pain relief that she would obtain from it.  We discussed the addition of amitriptyline at a low dose for chronic pain management.  Risks and benefits were reviewed.  I will also refill the patient's Percocet as below.  Pharmacotherapy Assessment  Analgesic:  02/27/2019  1   02/07/2019  Oxycodone-Acetaminophen 10-325  120.00  30 Bi Lat   [id number]   Wal (7587)   0  60.00 MME  Comm Ins   [place]     Monitoring: Pharmacotherapy: No side-effects or adverse reactions reported. [place] PMP: PDMP reviewed during this encounter.       Compliance: No problems identified. Effectiveness: Clinically acceptable. Plan: Refer to "POC".  UDS:  Summary  Date Value Ref Range Status  02/06/2018 FINAL  Final    Comment:    ==================================================================== TOXASSURE SELECT 13 (MW) ==================================================================== Test                             Result       Flag       Units Drug Present   Hydrocodone                    3616                    ng/mg creat   Hydromorphone                  1604                    ng/mg creat   Dihydrocodeine                 328  ng/mg creat   Norhydrocodone                 5344                    ng/mg creat    Sources of hydrocodone include scheduled prescription    medications. Hydromorphone, dihydrocodeine and norhydrocodone are    expected metabolites of hydrocodone. Hydromorphone and    dihydrocodeine are also available as scheduled prescription    medications. ==================================================================== Test                      Result    Flag   Units      Ref Range   Creatinine              25               mg/dL       >=20 ==================================================================== Declared Medications:  Medication list was not provided. ==================================================================== For clinical consultation, please call [phone]. ====================================================================    Laboratory Chemistry Profile (12 mo)  Renal: 02/12/2019: BUN 11; Creatinine, Ser 0.50  Lab Results  Component Value Date   GFR 108.68 01/11/2019   GFRAA >60 10/02/2015   GFRNONAA >60 10/02/2015   Hepatic: 01/11/2019: Albumin 4.5 Lab Results  Component Value Date   AST 31 01/11/2019   ALT 23 01/11/2019   Other: 01/11/2019: VITD 36.17 Note: Above Lab results reviewed.  Imaging  CUP PACEART REMOTE DEVICE CHECK Scheduled remote reviewed.  Normal device function.    Next remote 91 days.   Assessment  The primary encounter diagnosis was Chronic pain syndrome. Diagnoses of Lumbar radiculopathy, Lumbar degenerative disc disease, Spinal stenosis, lumbar region, with neurogenic claudication, Lumbar facet arthropathy, Lumbar spondylosis, and Spinal cord stimulator status were also pertinent to this visit.  Plan of Care  I have discontinued Caliana C. Daley's oxyCODONE-acetaminophen. I am also having her start on amitriptyline, oxyCODONE-acetaminophen, oxyCODONE-acetaminophen, and oxyCODONE-acetaminophen. Additionally, I am having her maintain her polyethylene glycol, Vitamin D (Ergocalciferol), gabapentin, promethazine, losartan, rosuvastatin, acetaminophen, solifenacin, and Uribel.  Pharmacotherapy (Medications Ordered): Meds ordered this encounter  Medications  . amitriptyline (ELAVIL) 10 MG tablet    Sig: Take 1 tablet (10 mg total) by mouth at bedtime.    Dispense:  30 tablet    Refill:  2  . oxyCODONE-acetaminophen (PERCOCET) 10-325 MG tablet    Sig: Take 1 tablet by mouth every 6 (six) hours as needed for pain. Must last 30 days.    Dispense:  120 tablet     Refill:  0    Chronic Pain. (STOP Act - Not applicable). Fill one day early if closed on scheduled refill date.  Marland Kitchen oxyCODONE-acetaminophen (PERCOCET) 10-325 MG tablet    Sig: Take 1 tablet by mouth every 6 (six) hours as needed for pain. Must last 30 days.    Dispense:  120 tablet    Refill:  0    Chronic Pain. (STOP Act - Not applicable). Fill one day early if closed on scheduled refill date.  Marland Kitchen oxyCODONE-acetaminophen (PERCOCET) 10-325 MG tablet    Sig: Take 1 tablet by mouth every 6 (six) hours as needed for pain. Must last 30 days.    Dispense:  120 tablet    Refill:  0    Chronic Pain. (STOP Act - Not applicable). Fill one day early if closed on scheduled refill date.   Orders:  Orders Placed This Encounter  Procedures  . UDS (  Compliance-13) ([facility]) ([facility]) (Established Pt.)    Volume: 30 ml(s). Minimum 3 ml of urine is needed. Document temperature of fresh sample. Indications: Long term (current) use of opiate analgesic [id number])   Follow-up plan:   Return in about 3 months (around 06/24/2019) for Medication Management.     Recent Visits Date Type Provider Dept  02/07/19 Office Visit Bianca Santa, MD [facility]  Showing recent visits within past 90 days and meeting all other requirements   Today's Visits Date Type Provider Dept  03/26/19 Office Visit Bianca Santa, MD [facility]  Showing today's visits and meeting all other requirements   Future Appointments No visits were found meeting these conditions.  Showing future appointments within next 90 days and meeting all other requirements   I discussed the assessment and treatment plan with the patient. The patient was provided an opportunity to ask questions and all were answered. The patient agreed with the plan and demonstrated an understanding of the instructions.  Patient advised to call back or seek an in-person evaluation if the symptoms or condition worsens.  Total duration of  non-face-to-face encounter:30 minutes.  Note by: Bianca Santa, MD Date: 03/26/2019; Time: 2:15 PM

## 2019-03-28 ENCOUNTER — Other Ambulatory Visit: Payer: Self-pay | Admitting: Oncology

## 2019-03-28 ENCOUNTER — Other Ambulatory Visit: Payer: Self-pay

## 2019-03-28 ENCOUNTER — Inpatient Hospital Stay: Payer: Medicare Other | Attending: Oncology

## 2019-03-28 VITALS — BP 149/83 | HR 69 | Temp 97.1°F | Resp 18

## 2019-03-28 DIAGNOSIS — Z5111 Encounter for antineoplastic chemotherapy: Secondary | ICD-10-CM | POA: Insufficient documentation

## 2019-03-28 DIAGNOSIS — C678 Malignant neoplasm of overlapping sites of bladder: Secondary | ICD-10-CM

## 2019-03-28 LAB — URINALYSIS, COMPLETE (UACMP) WITH MICROSCOPIC
Bilirubin Urine: NEGATIVE
Glucose, UA: NEGATIVE mg/dL
Ketones, ur: NEGATIVE mg/dL
Nitrite: POSITIVE — AB
Protein, ur: 30 mg/dL — AB
Specific Gravity, Urine: 1.005 (ref 1.005–1.030)
WBC, UA: >50 WBC/hpf — ABNORMAL HIGH (ref 0–5)
pH: 7 (ref 5.0–8.0)

## 2019-03-28 MED ORDER — SULFAMETHOXAZOLE-TRIMETHOPRIM 800-160 MG PO TABS: 1.0000 | ORAL_TABLET | Freq: Two times a day (BID) | ORAL | 2 refills | Status: DC

## 2019-03-28 MED ORDER — GABAPENTIN 300 MG PO CAPS: ORAL_CAPSULE | ORAL | 1 refills | Status: DC

## 2019-03-28 NOTE — Progress Notes (Signed)
Patient here for intravesicular chemo.  UA results showed UTI so per Dr. Grayland Ormond, treatment will be delayed until next week.  Prescription sent to patient's pharmacy for antibiotic.

## 2019-03-29 LAB — URINE CULTURE

## 2019-04-02 ENCOUNTER — Telehealth: Payer: Self-pay | Admitting: *Deleted

## 2019-04-02 NOTE — Telephone Encounter (Signed)
Patient called reporting that the son and daughter she lives with have Felt, she states her residence is separate, but she has been to visit them using a mask and distancing. She does not have any symptoms at this time and is asking if she will be able to come get her treatment Thursday or if she will have to reschedule it. Please advise

## 2019-04-02 NOTE — Telephone Encounter (Signed)
If she has a documented negative covid, then yes. Otherwise we will have to delay treatment 14-21 days.

## 2019-04-02 NOTE — Telephone Encounter (Signed)
Call returned to patient and advised of doctor response and she was given number to schedule herself for COVID testing and will call back to reschedule her appointment for treatment. 1/14 and 1/21 appts are cancelled

## 2019-04-03 ENCOUNTER — Ambulatory Visit: Payer: Medicare Other | Attending: Internal Medicine

## 2019-04-03 DIAGNOSIS — Z20822 Contact with and (suspected) exposure to covid-19: Secondary | ICD-10-CM

## 2019-04-04 ENCOUNTER — Inpatient Hospital Stay: Payer: Medicare Other

## 2019-04-04 LAB — NOVEL CORONAVIRUS, NAA: SARS-CoV-2, NAA: NOT DETECTED

## 2019-04-06 NOTE — Progress Notes (Signed)
ICD remote 

## 2019-04-11 ENCOUNTER — Inpatient Hospital Stay: Payer: Medicare Other

## 2019-04-12 ENCOUNTER — Other Ambulatory Visit: Payer: Self-pay | Admitting: Lab

## 2019-04-12 MED ORDER — PROMETHAZINE HCL 12.5 MG PO TABS: 12.5000 mg | ORAL_TABLET | Freq: Three times a day (TID) | ORAL | 2 refills | Status: DC | PRN

## 2019-04-16 ENCOUNTER — Encounter: Payer: Medicare Other | Admitting: Student in an Organized Health Care Education/Training Program

## 2019-04-18 ENCOUNTER — Inpatient Hospital Stay: Payer: Medicare Other

## 2019-04-18 ENCOUNTER — Other Ambulatory Visit: Payer: Self-pay

## 2019-04-18 VITALS — BP 142/84 | HR 60 | Temp 97.6°F | Resp 18 | Wt 119.2 lb

## 2019-04-18 DIAGNOSIS — C678 Malignant neoplasm of overlapping sites of bladder: Secondary | ICD-10-CM | POA: Diagnosis not present

## 2019-04-18 DIAGNOSIS — Z5111 Encounter for antineoplastic chemotherapy: Secondary | ICD-10-CM | POA: Diagnosis not present

## 2019-04-18 LAB — URINALYSIS, COMPLETE (UACMP) WITH MICROSCOPIC
Bilirubin Urine: NEGATIVE
Glucose, UA: NEGATIVE mg/dL
Ketones, ur: NEGATIVE mg/dL
Nitrite: POSITIVE — AB
Protein, ur: 100 mg/dL — AB
RBC / HPF: >50 RBC/hpf — ABNORMAL HIGH (ref 0–5)
Specific Gravity, Urine: 1.013 (ref 1.005–1.030)
WBC, UA: >50 WBC/hpf — ABNORMAL HIGH (ref 0–5)
pH: 7 (ref 5.0–8.0)

## 2019-04-22 ENCOUNTER — Telehealth: Payer: Self-pay | Admitting: Emergency Medicine

## 2019-04-22 ENCOUNTER — Telehealth: Payer: Self-pay | Admitting: *Deleted

## 2019-04-22 NOTE — Telephone Encounter (Signed)
Called pt to let her know that we would not be starting treatment this week, but would start next week, Thursday Feb 11th, and that Dr. Grayland Ormond would see her in infusion. Pt verbalized understanding and didn't have any further questions or concerns.

## 2019-04-22 NOTE — Telephone Encounter (Signed)
Patient called reporting she was to get a call from Judson Roch Thursday  about her appts due to the fact that she could not get her infusion because of something with her urine. She states she has not heard back form Judson Roch and wants to know what is to happen with her infusions. Please return her call.

## 2019-04-25 ENCOUNTER — Inpatient Hospital Stay: Payer: Medicare Other

## 2019-04-26 NOTE — Progress Notes (Signed)
[place]  [phone] [phone]  ID: [name] OB: Oct 06, 1940  MR#: [id number]  [id number]  Patient Care Team: [name], MD as PCP - General (Internal Medicine) Briscoe Deutscher, MD (Family Medicine)  CHIEF COMPLAINT: Non-invasive bladder cancer.  INTERVAL HISTORY: Patient returns to clinic today for further evaluation and reconsideration of cycle 1 of intravesical gemcitabine for her noninvasive bladder cancer.  Treatment had been delayed multiple times secondary to concern for UTI as well as possible Covid exposure.  She currently feels well and is asymptomatic.  She has no neurologic complaints.  She denies any recent fevers or illnesses.  She has a good appetite and denies weight loss.  She has no chest pain, shortness of breath, cough, or hemoptysis.  She denies any nausea, vomiting, constipation, or diarrhea.  She has no urinary complaints.  Patient offers no specific complaints today.  REVIEW OF SYSTEMS:   Review of Systems  Constitutional: Negative.  Negative for fever, malaise/fatigue and weight loss.  Respiratory: Negative.  Negative for cough, hemoptysis and shortness of breath.   Cardiovascular: Negative.  Negative for chest pain and leg swelling.  Gastrointestinal: Negative.  Negative for abdominal pain.  Genitourinary: Negative.  Negative for dysuria, frequency and hematuria.  Musculoskeletal: Negative.  Negative for back pain.  Skin: Negative.  Negative for rash.  Neurological: Negative.  Negative for dizziness, focal weakness, weakness and headaches.  Psychiatric/Behavioral: Negative.  The patient is not nervous/anxious.     As per HPI. Otherwise, a complete review of systems is negative.  PAST MEDICAL HISTORY: Past Medical History:  Diagnosis Date  . AICD (automatic cardioverter/defibrillator) present EP cardiologist--- dr Caryl Comes    placement 05-07-2014 , ef 25%,  NICM/  (02-11-2019 last echo 08-31-2018 ef 60-65%)   . Arthritis    "in about all my joints; for sure in my back"  . Bladder cancer [facility]) urologist-  dr Junious Silk   dx 1995--  recurrent bladder cancer 2015 , s/p TURBT's and chemo instillation's ;   04/ 2019  s/p TURBT  . Chronic hyponatremia   . Chronic low back pain with bilateral sciatica    s/p  spinal cord stimulator @ [facility]  10-09-2018  . Chronic systolic (congestive) heart failure [facility])    cardiologist-  dr Rogue Jury  . COPD with emphysema ([place])    (02-11-2019  per pt has never been on oxygen)  . Coronary artery disease cardiologist-  dr Kathlyn [place]   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% per cardiac cath 03-03-2014  . DDD (degenerative disc disease), thoracolumbar   . Frequent urination   . Full dentures   . Gait instability    due to chronic low back pain, uses roller walker  . GERD (gastroesophageal reflux disease)   . History of iron deficiency anemia 11/2013   resolved w/ IV Iron  (02-11-2019  per pt has not had any issues since 2015)  . History of stomach ulcers 11/2013  . Hypertension   . LBBB (left bundle branch block)   . NICM (nonischemic cardiomyopathy) ([place]) last echo 12-31-2015 ef 55-60%   dx 09/ 2015 per echo 20%;  myoview 09/ 2015 ef 28%;  per cardiac cath 12/ 2015 ef 30%;    . Nocturia more than twice per night   . S/P insertion of spinal cord stimulator followed by [facility]---- Dr Pecola Leisure (notes in care everywhere)   10-09-2018  @[facility] --- thoracic spinal cord stimular/ generator  (device from [facility])---- per pt has  a control  . Scoliosis     PAST SURGICAL HISTORY: Past Surgical History:  Procedure Laterality Date  . BI-VENTRICULAR IMPLANTABLE CARDIOVERTER DEFIBRILLATOR N/A 05/07/2014   Procedure: BI-VENTRICULAR IMPLANTABLE CARDIOVERTER DEFIBRILLATOR  (CRT-D);  Surgeon: Deboraha Sprang, MD;  Location: [facility] CATH LAB;  Service: Cardiovascular;  Laterality: N/A;  . CARDIAC CATHETERIZATION  03-03-2014  dr Kathlyn [place]   [facility]   pLAD 20%, pRCA 20%, dRCA 50%, RPLS  50%;  ef 30%, mild elevated LVEDP, mild gradient across aortic valve LVOT  . CARDIOVASCULAR STRESS TEST  11/25/2013   High risk nuclear study w/ large high severity inferior wall perfusion defect on stress and rest images, large mild severity anteroseptal wall perfusion defect on stress and rest images, No inducible ischemia/ global moderate hypokinesis, ef 28%  . CATARACT EXTRACTION W/ INTRAOCULAR LENS  IMPLANT, BILATERAL Bilateral right 12-2013 / left  02-2014  . CYSTOSCOPY W/ RETROGRADES Bilateral 01/06/2015   Procedure: CYSTOSCOPY WITH  BLADDER BIOPSY BILATERAL RETROGRADE PYELOGRAM,INSTILLATION OF MITOMYCIN C;  Surgeon: [name], MD;  Location: [facility] ORS;  Service: Urology;  Laterality: Bilateral;  . CYSTOSCOPY WITH BIOPSY N/A 10/06/2015   Procedure: CYSTO WITH BLADDER BIOPSY, FULGERATION, CHEMO IRRIGATION EPIRUBICIN IN PACU;  Surgeon: [name], MD;  Location: [facility];  Service: Urology;  Laterality: N/A;  . CYSTOSCOPY WITH BIOPSY N/A 02/12/2019   Procedure: CYSTOSCOPY WITH BIOPSY/ FULGURATION/ INSTILLATION OF GEMCITABINE, bilateral retrograde turbt greater 5cm;  Surgeon: [name], MD;  Location[facility];  Service: Urology;  Laterality: N/A;  . CYSTOSCOPY WITH FULGERATION N/A 06/30/2017   Procedure: Marland Kitchen CYSTOSCOPY WITH [place] FULGERATION/ BLADDER BIOPSY/ INSTILLATION OF EPIRUBICIN;  Surgeon: [name], MD;  Location: [facility];  Service: Urology;  Laterality: N/A;  . CYSTOSCOPY WITH RETROGRADE PYELOGRAM, URETEROSCOPY AND STENT PLACEMENT Bilateral 04/18/2014   Procedure: CYSTOSCOPY WITH RETROGRADE PYELOGRAM;  Surgeon: [name], MD;  Location: [facility];  Service: Urology;  Laterality: Bilateral;  . ESOPHAGOGASTRODUODENOSCOPY N/A 11/28/2013   Procedure: ESOPHAGOGASTRODUODENOSCOPY (EGD);  Surgeon: Arta Silence, MD;  Location[facility] ENDOSCOPY;  Service: Endoscopy;  Laterality: N/A;  . LUMBAR [place]  1980's   "ruptured  disc"  . 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 TRANSTHORACIC ECHOCARDIOGRAM  12/31/2015   dr Caryl Comes   ef [id number], grade 1 diastolic dysfunction/ mild MR/ septal motion showed abnormal function and dyssynergy  . TRANSURETHRAL RESECTION OF BLADDER  1995  . TRANSURETHRAL RESECTION OF BLADDER TUMOR N/A 12/13/2013   Procedure: TRANSURETHRAL RESECTION OF BLADDER TUMOR (TURBT);  Surgeon: [name], MD;  Location: [facility];  Service: Urology;  Laterality: N/A;  . TRANSURETHRAL RESECTION OF BLADDER TUMOR N/A 01/17/2014   Procedure: TRANSURETHRAL RESECTION OF BLADDER TUMOR (TURBT);  Surgeon: [name], MD;  Location: [facility];  Service: Urology;  Laterality: N/A;  . TRANSURETHRAL RESECTION OF BLADDER TUMOR N/A 04/18/2014   Procedure: TRANSURETHRAL RESECTION OF BLADDER TUMOR (TURBT), CYSTOGRAM;  Surgeon: [name], MD;  Location: [facility];  Service: Urology;  Laterality: N/A;    FAMILY HISTORY: Family History  Problem Relation Age of Onset  . Cancer Mother   . Cancer Brother     ADVANCED DIRECTIVES (Y/N):  N  HEALTH MAINTENANCE: Social History   Tobacco Use  . Smoking status: Current Every Day Smoker    Packs/day: 0.50    Years: 56.00    Pack years: 28.00    Types: Cigarettes  .  Smokeless tobacco: Never Used  . Tobacco comment: 02-11-2019  per pt currently smokes 10 cig's per day , started smoking age 27  Substance Use Topics  . Alcohol use: No  . Drug use: No     Colonoscopy:  PAP:  Bone density:  Lipid panel:  No Known Allergies  Current Outpatient Medications  Medication Sig Dispense Refill  . acetaminophen (TYLENOL) 500 MG tablet Take 500 mg by mouth every 8 (eight) hours as needed.    Marland Kitchen amitriptyline (ELAVIL) 10 MG tablet Take 1 tablet (10 mg total) by mouth at bedtime. 30 tablet 2  . amLODipine (NORVASC) 2.5 MG tablet Take 1 tablet (2.5 mg total) by mouth  daily. 90 tablet 3  . gabapentin (NEURONTIN) 300 MG capsule Three times daily as needed for back pain 270 capsule 1  . losartan (COZAAR) 100 MG tablet Take 1 tablet (100 mg total) by mouth at bedtime. 90 tablet 1  . Meth-Hyo-M Bl-Na Phos-Ph Sal (URIBEL) 118 MG CAPS TK 1 C PO Q 6 H PRN    . metoprolol succinate (TOPROL-XL) 25 MG 24 hr tablet Take 1 tablet (25 mg total) by mouth daily. 90 tablet 3  . nitrofurantoin (MACRODANTIN) 50 MG capsule Take 50 mg by mouth at bedtime.    Marland Kitchen oxyCODONE-acetaminophen (PERCOCET) 10-325 MG tablet Take 1 tablet by mouth every 6 (six) hours as needed for pain. Must last 30 days. 120 tablet 0  . [START ON 05/25/2019] oxyCODONE-acetaminophen (PERCOCET) 10-325 MG tablet Take 1 tablet by mouth every 6 (six) hours as needed for pain. Must last 30 days. 120 tablet 0  . polyethylene glycol (MIRALAX / GLYCOLAX) packet Take 17 g by mouth daily as needed for mild constipation.     . promethazine (PHENERGAN) 12.5 MG tablet Take 1 tablet (12.5 mg total) by mouth every 8 (eight) hours as needed for nausea or vomiting. 30 tablet 2  . rosuvastatin (CRESTOR) 5 MG tablet Take 5 mg by mouth daily.     . solifenacin (VESICARE) 5 MG tablet Take 5 mg by mouth daily.    Marland Kitchen sulfamethoxazole-trimethoprim (BACTRIM DS) 800-160 MG tablet Take 1 tablet by mouth 2 (two) times daily. For 7 days. 14 tablet 2  . Vitamin D, Ergocalciferol, (DRISDOL) 50000 units CAPS capsule TK ONE C PO Q WEEK FOR 8 WEEKS  0   No current facility-administered medications for this visit.   Facility-Administered Medications Ordered in Other Visits  Medication Dose Route Frequency Provider Last Rate Last Admin  . epirubicin (ELLENCE) 50 mg in sodium chloride 0.9 % bladder instillation  50 mg Bladder Instillation Once [name], MD      . gemcitabine [facility]) chemo syringe for bladder instillation 2,000 mg  2,000 mg Bladder Instillation Once [name], MD        OBJECTIVE: There were no vitals filed for  this visit.   There is no height or weight on file to calculate BMI.    ECOG FS:0 - Asymptomatic  General: Well-developed, well-nourished, no acute distress. Eyes: Pink conjunctiva, anicteric sclera. HEENT: Normocephalic, moist mucous membranes. Lungs: No audible wheezing or coughing. Heart: Regular rate and rhythm. Abdomen: Soft, nontender, no obvious distention. Musculoskeletal: No edema, cyanosis, or clubbing. Neuro: Alert, answering all questions appropriately. Cranial nerves grossly intact. Skin: No rashes or petechiae noted. Psych: Normal affect.  LAB RESULTS:  Lab Results  Component Value Date   NA 137 02/12/2019   K 4.2 02/12/2019   CL 99 02/12/2019   CO2 27 01/11/2019  GLUCOSE 95 02/12/2019   BUN 11 02/12/2019   CREATININE 0.50 02/12/2019   CALCIUM 9.5 01/11/2019   PROT 6.6 01/11/2019   ALBUMIN 4.5 01/11/2019   AST 31 01/11/2019   ALT 23 01/11/2019   ALKPHOS 79 01/11/2019   BILITOT 0.5 01/11/2019   GFRNONAA >60 10/02/2015   GFRAA >60 10/02/2015    Lab Results  Component Value Date   WBC 7.9 10/02/2015   NEUTROABS 5.3 04/28/2014   HGB 14.3 02/12/2019   HCT 42.0 02/12/2019   MCV 92.0 10/02/2015   PLT 209 10/02/2015     STUDIES: No results found.  ASSESSMENT: Non-invasive bladder cancer.  PLAN:    1. Non-invasive bladder cancer: Patient will benefit from 2000 mg intravesical gemcitabine weekly x6.  Patient has a persistently positive UA, but now is on chronic antibiotics.  All laboratory work will be monitored by patient's primary urologist.  Patient expressed understanding that if she has any questions, concerns, or side effects from her treatment that she should defer to her primary urologist.  Proceed with cycle 1 of 6 of weekly treatment today.  Return to clinic weekly for treatments.  No further MD follow-up has been scheduled.  Please refer patient back if there are any questions or concerns.  I spent a total of 30 minutes reviewing chart data,  face-to-face evaluation with the patient, counseling and coordination of care as detailed above.   Patient expressed understanding and was in agreement with this plan. She also understands that She can call clinic at any time with any questions, concerns, or complaints.   Cancer Staging Bladder cancer [facility]) Staging form: Urinary Bladder, AJCC 7th Edition - Clinical stage from 03/19/2019: Stage 0is (Tis, N0, M0) - Signed by Lloyd Huger, MD on 03/19/2019   Lloyd Huger, MD   05/03/2019 1:11 PM

## 2019-05-01 ENCOUNTER — Encounter: Payer: Self-pay | Admitting: Oncology

## 2019-05-01 ENCOUNTER — Telehealth: Payer: Self-pay | Admitting: Emergency Medicine

## 2019-05-01 NOTE — Progress Notes (Signed)
Patient prescreened for appointment. Patient has no concerns or questions.  

## 2019-05-01 NOTE — Telephone Encounter (Signed)
Called [name] after getting message that she had some questions regarding appts for tomorrow. [name] just wanted to make sure that appt times were correct. Explained that Dr. Grayland Ormond would be seeing her while getting her infusion, and the appt time was to hold a place, and also so that Dr. Grayland Ormond could document. [name] verbalized understanding and didn't have any further questions or concerns.

## 2019-05-02 ENCOUNTER — Inpatient Hospital Stay: Payer: Medicare Other | Attending: Oncology

## 2019-05-02 ENCOUNTER — Inpatient Hospital Stay (HOSPITAL_BASED_OUTPATIENT_CLINIC_OR_DEPARTMENT_OTHER): Payer: Medicare Other | Admitting: Oncology

## 2019-05-02 ENCOUNTER — Other Ambulatory Visit: Payer: Self-pay

## 2019-05-02 VITALS — BP 165/96 | HR 73 | Temp 95.8°F | Resp 20 | Wt 120.0 lb

## 2019-05-02 DIAGNOSIS — K219 Gastro-esophageal reflux disease without esophagitis: Secondary | ICD-10-CM | POA: Diagnosis not present

## 2019-05-02 DIAGNOSIS — I5022 Chronic systolic (congestive) heart failure: Secondary | ICD-10-CM | POA: Diagnosis not present

## 2019-05-02 DIAGNOSIS — F17210 Nicotine dependence, cigarettes, uncomplicated: Secondary | ICD-10-CM | POA: Diagnosis not present

## 2019-05-02 DIAGNOSIS — Z79899 Other long term (current) drug therapy: Secondary | ICD-10-CM | POA: Insufficient documentation

## 2019-05-02 DIAGNOSIS — Z5111 Encounter for antineoplastic chemotherapy: Secondary | ICD-10-CM | POA: Diagnosis not present

## 2019-05-02 DIAGNOSIS — Z95810 Presence of automatic (implantable) cardiac defibrillator: Secondary | ICD-10-CM | POA: Diagnosis not present

## 2019-05-02 DIAGNOSIS — I2510 Atherosclerotic heart disease of native coronary artery without angina pectoris: Secondary | ICD-10-CM | POA: Insufficient documentation

## 2019-05-02 DIAGNOSIS — M1990 Unspecified osteoarthritis, unspecified site: Secondary | ICD-10-CM | POA: Insufficient documentation

## 2019-05-02 DIAGNOSIS — I110 Hypertensive heart disease with heart failure: Secondary | ICD-10-CM | POA: Diagnosis not present

## 2019-05-02 DIAGNOSIS — C678 Malignant neoplasm of overlapping sites of bladder: Secondary | ICD-10-CM | POA: Diagnosis not present

## 2019-05-02 DIAGNOSIS — C679 Malignant neoplasm of bladder, unspecified: Secondary | ICD-10-CM | POA: Diagnosis not present

## 2019-05-02 LAB — URINALYSIS, COMPLETE (UACMP) WITH MICROSCOPIC
Bilirubin Urine: NEGATIVE
Glucose, UA: NEGATIVE mg/dL
Ketones, ur: NEGATIVE mg/dL
Nitrite: NEGATIVE
Protein, ur: NEGATIVE mg/dL
Specific Gravity, Urine: 1.003 — ABNORMAL LOW (ref 1.005–1.030)
WBC, UA: >50 WBC/hpf (ref 0–5)
pH: 7 (ref 5.0–8.0)

## 2019-05-02 MED ORDER — PROCHLORPERAZINE MALEATE 10 MG PO TABS
10.0000 mg | ORAL_TABLET | Freq: Once | ORAL | Status: AC
  Administered 2019-05-02: 11:00:00 10 mg via ORAL
  Filled 2019-05-02: qty 1

## 2019-05-02 MED ORDER — SODIUM CHLORIDE 0.9 % IV SOLN
Freq: Once | INTRAVENOUS | Status: DC
  Filled 2019-05-02: qty 250

## 2019-05-02 MED ORDER — GEMCITABINE CHEMO FOR BLADDER INSTILLATION 2000 MG
2000.0000 mg | Freq: Once | INTRAVENOUS | Status: AC
  Administered 2019-05-02: 2000 mg via INTRAVESICAL
  Filled 2019-05-02: qty 52.6

## 2019-05-02 NOTE — Progress Notes (Signed)
1102-Gemzar instilled into bladder. 1140, patient complaining of "feeling like I am peeing and it really hurts. Take it out" Gemzar instilled for 40 minutes total.

## 2019-05-09 ENCOUNTER — Other Ambulatory Visit: Payer: Self-pay

## 2019-05-09 ENCOUNTER — Inpatient Hospital Stay: Payer: Medicare Other

## 2019-05-09 VITALS — BP 159/95 | HR 75 | Temp 96.2°F | Resp 18 | Wt 121.5 lb

## 2019-05-09 DIAGNOSIS — C678 Malignant neoplasm of overlapping sites of bladder: Secondary | ICD-10-CM

## 2019-05-09 DIAGNOSIS — C679 Malignant neoplasm of bladder, unspecified: Secondary | ICD-10-CM | POA: Diagnosis not present

## 2019-05-09 DIAGNOSIS — I5022 Chronic systolic (congestive) heart failure: Secondary | ICD-10-CM | POA: Diagnosis not present

## 2019-05-09 DIAGNOSIS — I2510 Atherosclerotic heart disease of native coronary artery without angina pectoris: Secondary | ICD-10-CM | POA: Diagnosis not present

## 2019-05-09 DIAGNOSIS — I110 Hypertensive heart disease with heart failure: Secondary | ICD-10-CM | POA: Diagnosis not present

## 2019-05-09 DIAGNOSIS — Z5111 Encounter for antineoplastic chemotherapy: Secondary | ICD-10-CM | POA: Diagnosis not present

## 2019-05-09 DIAGNOSIS — M1990 Unspecified osteoarthritis, unspecified site: Secondary | ICD-10-CM | POA: Diagnosis not present

## 2019-05-09 LAB — URINALYSIS, COMPLETE (UACMP) WITH MICROSCOPIC
Bilirubin Urine: NEGATIVE
Glucose, UA: NEGATIVE mg/dL
Ketones, ur: NEGATIVE mg/dL
Nitrite: POSITIVE — AB
Protein, ur: 100 mg/dL — AB
Specific Gravity, Urine: 1.009 (ref 1.005–1.030)
WBC, UA: >50 WBC/hpf — ABNORMAL HIGH (ref 0–5)
pH: 7 (ref 5.0–8.0)

## 2019-05-09 MED ORDER — BELLADONNA ALKALOIDS-OPIUM 16.2-60 MG RE SUPP
1.0000 | Freq: Once | RECTAL | Status: AC | PRN
  Administered 2019-05-09: 11:00:00 1 via RECTAL
  Filled 2019-05-09: qty 1

## 2019-05-09 MED ORDER — PROCHLORPERAZINE MALEATE 10 MG PO TABS
10.0000 mg | ORAL_TABLET | Freq: Once | ORAL | Status: AC
  Administered 2019-05-09: 11:00:00 10 mg via ORAL
  Filled 2019-05-09: qty 1

## 2019-05-09 MED ORDER — OXYBUTYNIN CHLORIDE 5 MG PO TABS
5.0000 mg | ORAL_TABLET | Freq: Once | ORAL | Status: DC | PRN
  Filled 2019-05-09: qty 1

## 2019-05-09 MED ORDER — GEMCITABINE CHEMO FOR BLADDER INSTILLATION 2000 MG
2000.0000 mg | Freq: Once | INTRAVENOUS | Status: AC
  Administered 2019-05-09: 12:00:00 2000 mg via INTRAVESICAL
  Filled 2019-05-09: qty 52.6

## 2019-05-09 NOTE — Progress Notes (Signed)
1100: Per Dr. Grayland Ormond urine has been reviewed, pt reports that she is still taking antibiotic at home as directed. Per Dr. Grayland Ormond okay to proceed with treatment as scheduled. Per Dr. Grayland Ormond okay to proceed with B/P of 159/95. Pt requests to have something for bladder spasms prior to treatment. Per Dr. Grayland Ormond okay to give PRN order of B&O Suppository prior to treatment to help prevent spasms. *see MAR*. Pt agrees with plan.    1230: Pt states she wants the foley unclamped due to severe pain and severe urgency to use the restroom. Pt aware that she needs to keep foley clamped for 30 more minutes, but refuses at this time. Foley unclamped per pt request. MD aware. Per Dr. Grayland Ormond okay to discharge home and NNO at this time.  1245: Foley removed and pt discharged in stable condition.

## 2019-05-15 DIAGNOSIS — Z8551 Personal history of malignant neoplasm of bladder: Secondary | ICD-10-CM | POA: Diagnosis not present

## 2019-05-15 DIAGNOSIS — R350 Frequency of micturition: Secondary | ICD-10-CM | POA: Diagnosis not present

## 2019-05-15 DIAGNOSIS — R8271 Bacteriuria: Secondary | ICD-10-CM | POA: Diagnosis not present

## 2019-05-16 ENCOUNTER — Inpatient Hospital Stay: Payer: Medicare Other

## 2019-05-16 ENCOUNTER — Other Ambulatory Visit: Payer: Self-pay | Admitting: Oncology

## 2019-05-16 ENCOUNTER — Other Ambulatory Visit: Payer: Self-pay

## 2019-05-16 VITALS — BP 153/90 | HR 73 | Temp 96.3°F | Resp 20 | Wt 123.0 lb

## 2019-05-16 DIAGNOSIS — I110 Hypertensive heart disease with heart failure: Secondary | ICD-10-CM | POA: Diagnosis not present

## 2019-05-16 DIAGNOSIS — I5022 Chronic systolic (congestive) heart failure: Secondary | ICD-10-CM | POA: Diagnosis not present

## 2019-05-16 DIAGNOSIS — C679 Malignant neoplasm of bladder, unspecified: Secondary | ICD-10-CM | POA: Diagnosis not present

## 2019-05-16 DIAGNOSIS — Z5111 Encounter for antineoplastic chemotherapy: Secondary | ICD-10-CM | POA: Diagnosis not present

## 2019-05-16 DIAGNOSIS — M1990 Unspecified osteoarthritis, unspecified site: Secondary | ICD-10-CM | POA: Diagnosis not present

## 2019-05-16 DIAGNOSIS — I2510 Atherosclerotic heart disease of native coronary artery without angina pectoris: Secondary | ICD-10-CM | POA: Diagnosis not present

## 2019-05-16 DIAGNOSIS — C678 Malignant neoplasm of overlapping sites of bladder: Secondary | ICD-10-CM

## 2019-05-16 LAB — URINALYSIS, COMPLETE (UACMP) WITH MICROSCOPIC
Bilirubin Urine: NEGATIVE
Glucose, UA: NEGATIVE mg/dL
Ketones, ur: NEGATIVE mg/dL
Nitrite: NEGATIVE
Protein, ur: 30 mg/dL — AB
Specific Gravity, Urine: 1.003 — ABNORMAL LOW (ref 1.005–1.030)
WBC, UA: >50 WBC/hpf — ABNORMAL HIGH (ref 0–5)
pH: 7 (ref 5.0–8.0)

## 2019-05-16 MED ORDER — BELLADONNA ALKALOIDS-OPIUM 16.2-60 MG RE SUPP
1.0000 | Freq: Once | RECTAL | Status: AC | PRN
  Administered 2019-05-16: 10:00:00 1 via RECTAL
  Filled 2019-05-16: qty 1

## 2019-05-16 MED ORDER — OXYBUTYNIN CHLORIDE 5 MG PO TABS
5.0000 mg | ORAL_TABLET | Freq: Once | ORAL | Status: AC | PRN
  Administered 2019-05-16: 09:00:00 5 mg via ORAL
  Filled 2019-05-16: qty 1

## 2019-05-16 MED ORDER — PROCHLORPERAZINE MALEATE 10 MG PO TABS
10.0000 mg | ORAL_TABLET | Freq: Once | ORAL | Status: AC
  Administered 2019-05-16: 10:00:00 10 mg via ORAL
  Filled 2019-05-16: qty 1

## 2019-05-16 MED ORDER — GEMCITABINE CHEMO FOR BLADDER INSTILLATION 2000 MG
2000.0000 mg | Freq: Once | INTRAVENOUS | Status: AC
  Administered 2019-05-16: 11:00:00 2000 mg via INTRAVESICAL
  Filled 2019-05-16: qty 52.6

## 2019-05-16 NOTE — Progress Notes (Signed)
Patient wants to try both the B&O suppository and Oxybutynin PO prior to Gemzar instillation today for her bladder spasms. MD, Dr. Grayland Ormond, notified and aware. Per MD order: proceed with giving both the B&O suppository and Oxybutynin PO today.  [id number]- Urinalysis results reviewed with MD, Dr. Grayland Ormond. Per MD order: proceed with scheduled Gemzar treatment at this time.  1034- Gemzar instillation into bladder via foley catheter. Foley catheter clamped. Patient encouraged to hold the medication for 60 minutes.  1104- Patient called nurse to room. Upon entering patient's room, patient was grimacing, moaning, and restless. Patient states, "I can't hold the medicine anymore. Take it out." Foley catheter unclamped per patient request. Total dwell time of Gemzar: 30 minutes. MD, Dr. Grayland Ormond, notified and aware.   1135- Patient discharged to home.

## 2019-05-22 ENCOUNTER — Telehealth: Payer: Self-pay | Admitting: *Deleted

## 2019-05-22 DIAGNOSIS — G894 Chronic pain syndrome: Secondary | ICD-10-CM

## 2019-05-22 MED ORDER — OXYCODONE-ACETAMINOPHEN 10-325 MG PO TABS: 1.0000 | ORAL_TABLET | Freq: Four times a day (QID) | ORAL | 0 refills | Status: AC | PRN

## 2019-05-22 NOTE — Telephone Encounter (Signed)
Patient called and states she has 6 pills left of her oxycodone and cant get it filled until 05-25-2019 and would like to have [place] send her in an emergency supply.  I questioned the patient and asked her why she was out 3 days early and she states because she needed them.  Explained to her that her pain medication was to be taken as ordered and was to last her 30 days.  Her last medication was filled on 04-24-2019, which was one day earlier than prescribed to be filled. States she is going to be in a lot of pain for a couple of days.  Again, reiterated to her that she must take her medication as prescribed or she will run out early.   FYI Dr Holley Raring

## 2019-05-22 NOTE — Telephone Encounter (Signed)
Patient notified that Dr Holley Raring had sent in 8 tablets for this patient.

## 2019-05-22 NOTE — Telephone Encounter (Signed)
Please inform patient that this is her morning.  She must take her medications as prescribed if she does run out early in the future, she will need to manage the best that she can.  For the time being, I will send a new prescription for the next 2 days so that she can make it to her next fill on 05-25-19.  PMP reviewed.  Requested Prescriptions   Signed Prescriptions Disp Refills  . oxyCODONE-acetaminophen (PERCOCET) 10-325 MG tablet 8 tablet 0    Sig: Take 1 tablet by mouth every 6 (six) hours as needed for up to 2 days for pain.    Authorizing Provider: [name]

## 2019-05-23 ENCOUNTER — Other Ambulatory Visit: Payer: Self-pay

## 2019-05-23 ENCOUNTER — Inpatient Hospital Stay: Payer: Medicare Other | Attending: Oncology

## 2019-05-23 VITALS — BP 149/87 | HR 74 | Temp 97.0°F | Resp 18

## 2019-05-23 DIAGNOSIS — Z5111 Encounter for antineoplastic chemotherapy: Secondary | ICD-10-CM | POA: Insufficient documentation

## 2019-05-23 DIAGNOSIS — Z95810 Presence of automatic (implantable) cardiac defibrillator: Secondary | ICD-10-CM | POA: Insufficient documentation

## 2019-05-23 DIAGNOSIS — R3915 Urgency of urination: Secondary | ICD-10-CM | POA: Insufficient documentation

## 2019-05-23 DIAGNOSIS — R5381 Other malaise: Secondary | ICD-10-CM | POA: Insufficient documentation

## 2019-05-23 DIAGNOSIS — I5022 Chronic systolic (congestive) heart failure: Secondary | ICD-10-CM | POA: Diagnosis not present

## 2019-05-23 DIAGNOSIS — K219 Gastro-esophageal reflux disease without esophagitis: Secondary | ICD-10-CM | POA: Diagnosis not present

## 2019-05-23 DIAGNOSIS — Z79899 Other long term (current) drug therapy: Secondary | ICD-10-CM | POA: Diagnosis not present

## 2019-05-23 DIAGNOSIS — R309 Painful micturition, unspecified: Secondary | ICD-10-CM | POA: Insufficient documentation

## 2019-05-23 DIAGNOSIS — E871 Hypo-osmolality and hyponatremia: Secondary | ICD-10-CM | POA: Diagnosis not present

## 2019-05-23 DIAGNOSIS — M1990 Unspecified osteoarthritis, unspecified site: Secondary | ICD-10-CM | POA: Insufficient documentation

## 2019-05-23 DIAGNOSIS — J449 Chronic obstructive pulmonary disease, unspecified: Secondary | ICD-10-CM | POA: Diagnosis not present

## 2019-05-23 DIAGNOSIS — E861 Hypovolemia: Secondary | ICD-10-CM | POA: Insufficient documentation

## 2019-05-23 DIAGNOSIS — I110 Hypertensive heart disease with heart failure: Secondary | ICD-10-CM | POA: Insufficient documentation

## 2019-05-23 DIAGNOSIS — F17210 Nicotine dependence, cigarettes, uncomplicated: Secondary | ICD-10-CM | POA: Diagnosis not present

## 2019-05-23 DIAGNOSIS — I2510 Atherosclerotic heart disease of native coronary artery without angina pectoris: Secondary | ICD-10-CM | POA: Diagnosis not present

## 2019-05-23 DIAGNOSIS — C678 Malignant neoplasm of overlapping sites of bladder: Secondary | ICD-10-CM

## 2019-05-23 DIAGNOSIS — R110 Nausea: Secondary | ICD-10-CM | POA: Diagnosis not present

## 2019-05-23 DIAGNOSIS — R5383 Other fatigue: Secondary | ICD-10-CM | POA: Diagnosis not present

## 2019-05-23 DIAGNOSIS — R531 Weakness: Secondary | ICD-10-CM | POA: Diagnosis not present

## 2019-05-23 DIAGNOSIS — M419 Scoliosis, unspecified: Secondary | ICD-10-CM | POA: Insufficient documentation

## 2019-05-23 DIAGNOSIS — C679 Malignant neoplasm of bladder, unspecified: Secondary | ICD-10-CM | POA: Diagnosis not present

## 2019-05-23 LAB — URINALYSIS, COMPLETE (UACMP) WITH MICROSCOPIC
Bacteria, UA: NONE SEEN
Bilirubin Urine: NEGATIVE
Glucose, UA: NEGATIVE mg/dL
Ketones, ur: NEGATIVE mg/dL
Nitrite: NEGATIVE
Protein, ur: 100 mg/dL — AB
Specific Gravity, Urine: 1.006 (ref 1.005–1.030)
WBC, UA: >50 WBC/hpf — ABNORMAL HIGH (ref 0–5)
pH: 7 (ref 5.0–8.0)

## 2019-05-23 MED ORDER — OXYBUTYNIN CHLORIDE 5 MG PO TABS
5.0000 mg | ORAL_TABLET | Freq: Once | ORAL | Status: AC | PRN
  Administered 2019-05-23: 5 mg via ORAL
  Filled 2019-05-23: qty 1

## 2019-05-23 MED ORDER — PROCHLORPERAZINE MALEATE 10 MG PO TABS
10.0000 mg | ORAL_TABLET | Freq: Once | ORAL | Status: AC
  Administered 2019-05-23: 10 mg via ORAL
  Filled 2019-05-23: qty 1

## 2019-05-23 MED ORDER — GEMCITABINE CHEMO FOR BLADDER INSTILLATION 2000 MG
2000.0000 mg | Freq: Once | INTRAVENOUS | Status: AC
  Administered 2019-05-23: 2000 mg via INTRAVESICAL
  Filled 2019-05-23: qty 52.6

## 2019-05-23 MED ORDER — BELLADONNA ALKALOIDS-OPIUM 16.2-60 MG RE SUPP
1.0000 | Freq: Once | RECTAL | Status: AC | PRN
  Administered 2019-05-23: 1 via RECTAL
  Filled 2019-05-23: qty 1

## 2019-05-23 NOTE — Progress Notes (Signed)
UA results reviewed with MD and treatment team at [phone]. Per MD to continue with treatment, pt updated and all questions answered at this time. Ditropan 5mg  PO and B&O suppository given as ordered prior to Gemzar instillation into bladder with foley. Gemzar dwelled for 30 minutes until patient called RN into room stating "I can't hold it anymore, take it out." RN encouraged pt to try her best to hold the medication for 30 more minutes as ordered. Pt continued to state "I can't hold it anymore." Clamps removed per pt request.  MD made aware that dwell time for Gemzar was 30 minutes, no new orders given at this time. Foley removed and pt stable for discharge.   Aamna Mallozzi [employer]

## 2019-05-27 ENCOUNTER — Telehealth: Payer: Self-pay | Admitting: *Deleted

## 2019-05-27 NOTE — Telephone Encounter (Addendum)
Call returned and I spoke with daughter [name] abdominal she will contact Dr Junious Silk before she decides if she wants to come to our office for UA, C&S

## 2019-05-27 NOTE — Telephone Encounter (Signed)
Patient called reporting that she is having problems with ability to hold her urine, frequency (every 5 minutes) Urgency and increased incontinence. She is also complaining of rectal pain "feels like internal hemorrhoids" possibly from the suppositories she gets with her treatment. She is also complaining of burning and pain at the meatus. Asking for suggestions to help with this. Please advise.

## 2019-05-27 NOTE — Telephone Encounter (Signed)
Is she on abx?  We can get a u/a and culture.  She also needs to see her urologist asap.

## 2019-05-28 DIAGNOSIS — R300 Dysuria: Secondary | ICD-10-CM | POA: Diagnosis not present

## 2019-05-28 DIAGNOSIS — Z8551 Personal history of malignant neoplasm of bladder: Secondary | ICD-10-CM | POA: Diagnosis not present

## 2019-05-28 DIAGNOSIS — R3914 Feeling of incomplete bladder emptying: Secondary | ICD-10-CM | POA: Diagnosis not present

## 2019-05-29 ENCOUNTER — Ambulatory Visit (INDEPENDENT_AMBULATORY_CARE_PROVIDER_SITE_OTHER): Payer: Medicare Other | Admitting: *Deleted

## 2019-05-29 DIAGNOSIS — I420 Dilated cardiomyopathy: Secondary | ICD-10-CM | POA: Diagnosis not present

## 2019-05-29 LAB — CUP PACEART REMOTE DEVICE CHECK
Battery Remaining Longevity: 21 mo
Battery Voltage: 2.9 V
Brady Statistic AP VP Percent: 37.73 %
Brady Statistic AP VS Percent: 0.57 %
Brady Statistic AS VP Percent: 60.35 %
Brady Statistic AS VS Percent: 1.36 %
Brady Statistic RA Percent Paced: 37.59 %
Brady Statistic RV Percent Paced: 95.88 %
Date Time Interrogation Session: 20210310033525
HighPow Impedance: 71 Ohm
Implantable Lead Implant Date: 20160217
Implantable Lead Implant Date: 20160217
Implantable Lead Implant Date: 20160217
Implantable Lead Location: 753858
Implantable Lead Location: 753859
Implantable Lead Location: 753860
Implantable Lead Model: 4298
Implantable Lead Model: 5076
Implantable Pulse Generator Implant Date: 20160217
Lead Channel Impedance Value: 342 Ohm
Lead Channel Impedance Value: 399 Ohm
Lead Channel Impedance Value: 399 Ohm
Lead Channel Impedance Value: 418 Ohm
Lead Channel Impedance Value: 456 Ohm
Lead Channel Impedance Value: 456 Ohm
Lead Channel Impedance Value: 456 Ohm
Lead Channel Impedance Value: 475 Ohm
Lead Channel Impedance Value: 665 Ohm
Lead Channel Impedance Value: 665 Ohm
Lead Channel Impedance Value: 703 Ohm
Lead Channel Impedance Value: 722 Ohm
Lead Channel Impedance Value: 760 Ohm
Lead Channel Pacing Threshold Amplitude: 0.625 V
Lead Channel Pacing Threshold Amplitude: 0.75 V
Lead Channel Pacing Threshold Amplitude: 1.25 V
Lead Channel Pacing Threshold Pulse Width: 0.4 ms
Lead Channel Pacing Threshold Pulse Width: 0.4 ms
Lead Channel Pacing Threshold Pulse Width: 0.4 ms
Lead Channel Sensing Intrinsic Amplitude: 2.125 mV
Lead Channel Sensing Intrinsic Amplitude: 2.125 mV
Lead Channel Sensing Intrinsic Amplitude: 20.625 mV
Lead Channel Sensing Intrinsic Amplitude: 20.625 mV
Lead Channel Setting Pacing Amplitude: 2 V
Lead Channel Setting Pacing Amplitude: 2.25 V
Lead Channel Setting Pacing Amplitude: 2.5 V
Lead Channel Setting Pacing Pulse Width: 0.4 ms
Lead Channel Setting Pacing Pulse Width: 0.4 ms
Lead Channel Setting Sensing Sensitivity: 0.45 mV

## 2019-05-30 ENCOUNTER — Inpatient Hospital Stay: Payer: Medicare Other

## 2019-05-30 VITALS — BP 143/88 | HR 60 | Temp 96.7°F | Resp 20 | Wt 120.0 lb

## 2019-05-30 DIAGNOSIS — R5383 Other fatigue: Secondary | ICD-10-CM | POA: Diagnosis not present

## 2019-05-30 DIAGNOSIS — E861 Hypovolemia: Secondary | ICD-10-CM | POA: Diagnosis not present

## 2019-05-30 DIAGNOSIS — C678 Malignant neoplasm of overlapping sites of bladder: Secondary | ICD-10-CM

## 2019-05-30 DIAGNOSIS — C679 Malignant neoplasm of bladder, unspecified: Secondary | ICD-10-CM | POA: Diagnosis not present

## 2019-05-30 DIAGNOSIS — Z5111 Encounter for antineoplastic chemotherapy: Secondary | ICD-10-CM | POA: Diagnosis not present

## 2019-05-30 DIAGNOSIS — R531 Weakness: Secondary | ICD-10-CM | POA: Diagnosis not present

## 2019-05-30 DIAGNOSIS — E871 Hypo-osmolality and hyponatremia: Secondary | ICD-10-CM | POA: Diagnosis not present

## 2019-05-30 LAB — URINALYSIS, COMPLETE (UACMP) WITH MICROSCOPIC
Bacteria, UA: NONE SEEN
Bilirubin Urine: NEGATIVE
Glucose, UA: NEGATIVE mg/dL
Hgb urine dipstick: NEGATIVE
Ketones, ur: NEGATIVE mg/dL
Nitrite: POSITIVE — AB
Protein, ur: 100 mg/dL — AB
Specific Gravity, Urine: 1.009 (ref 1.005–1.030)
WBC, UA: >50 WBC/hpf — ABNORMAL HIGH (ref 0–5)
pH: 7 (ref 5.0–8.0)

## 2019-05-30 MED ORDER — GEMCITABINE CHEMO FOR BLADDER INSTILLATION 2000 MG
2000.0000 mg | Freq: Once | INTRAVENOUS | Status: AC
  Administered 2019-05-30: 2000 mg via INTRAVESICAL
  Filled 2019-05-30: qty 52.6

## 2019-05-30 MED ORDER — OXYBUTYNIN CHLORIDE 5 MG PO TABS
5.0000 mg | ORAL_TABLET | Freq: Once | ORAL | Status: AC | PRN
  Administered 2019-05-30: 5 mg via ORAL
  Filled 2019-05-30: qty 1

## 2019-05-30 MED ORDER — PROCHLORPERAZINE MALEATE 10 MG PO TABS
10.0000 mg | ORAL_TABLET | Freq: Once | ORAL | Status: AC
  Administered 2019-05-30: 10 mg via ORAL
  Filled 2019-05-30: qty 1

## 2019-05-30 MED ORDER — BELLADONNA ALKALOIDS-OPIUM 16.2-60 MG RE SUPP
1.0000 | Freq: Once | RECTAL | Status: AC | PRN
  Administered 2019-05-30: 1 via RECTAL
  Filled 2019-05-30: qty 1

## 2019-05-30 NOTE — Progress Notes (Signed)
ICD Remote  

## 2019-06-03 ENCOUNTER — Inpatient Hospital Stay (HOSPITAL_BASED_OUTPATIENT_CLINIC_OR_DEPARTMENT_OTHER): Payer: Medicare Other | Admitting: Nurse Practitioner

## 2019-06-03 ENCOUNTER — Encounter: Payer: Self-pay | Admitting: Nurse Practitioner

## 2019-06-03 ENCOUNTER — Other Ambulatory Visit: Payer: Self-pay

## 2019-06-03 ENCOUNTER — Inpatient Hospital Stay: Payer: Medicare Other

## 2019-06-03 ENCOUNTER — Telehealth: Payer: Self-pay | Admitting: *Deleted

## 2019-06-03 ENCOUNTER — Other Ambulatory Visit: Payer: Self-pay | Admitting: *Deleted

## 2019-06-03 VITALS — BP 116/60 | HR 60

## 2019-06-03 VITALS — BP 70/50 | HR 75 | Temp 95.4°F | Resp 18

## 2019-06-03 DIAGNOSIS — C679 Malignant neoplasm of bladder, unspecified: Secondary | ICD-10-CM | POA: Diagnosis not present

## 2019-06-03 DIAGNOSIS — E871 Hypo-osmolality and hyponatremia: Secondary | ICD-10-CM

## 2019-06-03 DIAGNOSIS — C678 Malignant neoplasm of overlapping sites of bladder: Secondary | ICD-10-CM

## 2019-06-03 DIAGNOSIS — E861 Hypovolemia: Secondary | ICD-10-CM | POA: Diagnosis not present

## 2019-06-03 DIAGNOSIS — E860 Dehydration: Secondary | ICD-10-CM

## 2019-06-03 DIAGNOSIS — R350 Frequency of micturition: Secondary | ICD-10-CM

## 2019-06-03 DIAGNOSIS — I9589 Other hypotension: Secondary | ICD-10-CM

## 2019-06-03 DIAGNOSIS — Z5111 Encounter for antineoplastic chemotherapy: Secondary | ICD-10-CM | POA: Diagnosis not present

## 2019-06-03 DIAGNOSIS — R531 Weakness: Secondary | ICD-10-CM

## 2019-06-03 DIAGNOSIS — R5383 Other fatigue: Secondary | ICD-10-CM | POA: Diagnosis not present

## 2019-06-03 LAB — COMPREHENSIVE METABOLIC PANEL
ALT: 41 U/L (ref 0–44)
AST: 43 U/L — ABNORMAL HIGH (ref 15–41)
Albumin: 4.3 g/dL (ref 3.5–5.0)
Alkaline Phosphatase: 77 U/L (ref 38–126)
Anion gap: 6 (ref 5–15)
BUN: 12 mg/dL (ref 8–23)
CO2: 26 mmol/L (ref 22–32)
Calcium: 8.9 mg/dL (ref 8.9–10.3)
Chloride: 97 mmol/L — ABNORMAL LOW (ref 98–111)
Creatinine, Ser: 0.81 mg/dL (ref 0.44–1.00)
GFR calc Af Amer: >60 mL/min (ref >60)
GFR calc non Af Amer: >60 mL/min (ref >60)
Glucose, Bld: 138 mg/dL — ABNORMAL HIGH (ref 70–99)
Potassium: 4.9 mmol/L (ref 3.5–5.1)
Sodium: 129 mmol/L — ABNORMAL LOW (ref 135–145)
Total Bilirubin: 0.6 mg/dL (ref 0.3–1.2)
Total Protein: 6.8 g/dL (ref 6.5–8.1)

## 2019-06-03 LAB — URINALYSIS, COMPLETE (UACMP) WITH MICROSCOPIC
Bilirubin Urine: NEGATIVE
Glucose, UA: NEGATIVE mg/dL
Ketones, ur: NEGATIVE mg/dL
Nitrite: POSITIVE — AB
Protein, ur: >=300 mg/dL — AB
RBC / HPF: >50 RBC/hpf — ABNORMAL HIGH (ref 0–5)
Specific Gravity, Urine: 1.018 (ref 1.005–1.030)
WBC, UA: >50 WBC/hpf — ABNORMAL HIGH (ref 0–5)
pH: 6 (ref 5.0–8.0)

## 2019-06-03 LAB — MAGNESIUM: Magnesium: 2.3 mg/dL (ref 1.7–2.4)

## 2019-06-03 LAB — CBC WITH DIFFERENTIAL/PLATELET
Abs Immature Granulocytes: 0.02 10*3/uL (ref 0.00–0.07)
Basophils Absolute: 0.1 10*3/uL (ref 0.0–0.1)
Basophils Relative: 1 %
Eosinophils Absolute: 0.1 10*3/uL (ref 0.0–0.5)
Eosinophils Relative: 1 %
HCT: 39.5 % (ref 36.0–46.0)
Hemoglobin: 12.9 g/dL (ref 12.0–15.0)
Immature Granulocytes: 0 %
Lymphocytes Relative: 20 %
Lymphs Abs: 1.4 10*3/uL (ref 0.7–4.0)
MCH: 31.2 pg (ref 26.0–34.0)
MCHC: 32.7 g/dL (ref 30.0–36.0)
MCV: 95.6 fL (ref 80.0–100.0)
Monocytes Absolute: 0.4 10*3/uL (ref 0.1–1.0)
Monocytes Relative: 6 %
Neutro Abs: 5.1 10*3/uL (ref 1.7–7.7)
Neutrophils Relative %: 72 %
Platelets: 172 10*3/uL (ref 150–400)
RBC: 4.13 MIL/uL (ref 3.87–5.11)
RDW: 15.2 % (ref 11.5–15.5)
WBC: 7.1 10*3/uL (ref 4.0–10.5)
nRBC: 0 % (ref 0.0–0.2)

## 2019-06-03 MED ORDER — SODIUM CHLORIDE 0.9 % IV SOLN
Freq: Once | INTRAVENOUS | Status: AC
  Filled 2019-06-03: qty 250

## 2019-06-03 NOTE — Patient Instructions (Signed)
Hold losartan tonight. Check your blood pressure in the morning and contact Dr. Olin Pia office before taking any additional blood pressure medication.

## 2019-06-03 NOTE — Telephone Encounter (Signed)
Patient daughter called with concerns that patient is very weak and cannot get out of bed. Please advise.

## 2019-06-03 NOTE — Progress Notes (Signed)
Symptom Management [place]  [phone] [phone]  Patient Care Team: [name], MD as PCP - General (Internal Medicine) Briscoe Deutscher, MD (Family Medicine)   Name of the patient: [name]  [id number]  11-Mar-1941   Date of visit: 06/03/19  Diagnosis- Noninvasive bladder cancer  Chief complaint/ Reason for visit- weakness and fatigue  Heme/Onc history:  Oncology History  Bladder cancer ([place])  11/24/2013 Initial Diagnosis   Bladder cancer ([place])   03/19/2019 Cancer Staging   Staging form: Urinary Bladder, AJCC 7th Edition - Clinical stage from 03/19/2019: Stage 0is (Tis, N0, M0) - Signed by Lloyd Huger, MD on 03/19/2019   05/02/2019 -  Chemotherapy   The patient had gemcitabine (GEMZAR) chemo syringe for bladder instillation 2,000 mg, 2,000 mg, Bladder Instillation,  Once, 5 of 6 cycles Administration: 2,000 mg (05/02/2019), 2,000 mg (05/09/2019), 2,000 mg (05/16/2019), 2,000 mg (05/23/2019), 2,000 mg (05/30/2019)  for chemotherapy treatment.      Interval history- [name], 79 year old female diagnosed with noninvasive bladder cancer currently receiving intravesicular gemcitabine, presents to symptom management clinic for weakness and fatigue.  Per her daughter who contributes to history she was increasingly weak and tired, felt like she could not get out of bed this morning.  Says she has been eating and drinking normally but daughter says reduced.  Patient reports bladder pain and irritation with her treatments.  Says that she is tired.  No fevers or chills.  Has ongoing burning with urination and urinary urgency.  Says she may have lost 5 pounds during her treatments.  Some nausea but not severe.  ECOG FS:2 - Symptomatic, <50% confined to bed  Review of systems- Review of Systems  Constitutional: Positive for malaise/fatigue and weight loss. Negative for chills and fever.  HENT: Negative for hearing loss,  nosebleeds, sore throat and tinnitus.   Eyes: Negative for blurred vision and double vision.  Respiratory: Negative for cough, hemoptysis, shortness of breath and wheezing.   Cardiovascular: Negative for chest pain, palpitations and leg swelling.  Gastrointestinal: Negative for abdominal pain, blood in stool, constipation, diarrhea, melena, nausea and vomiting.  Genitourinary: Positive for dysuria, frequency and urgency. Negative for flank pain and hematuria.  Musculoskeletal: Positive for back pain. Negative for falls, joint pain and myalgias.  Skin: Negative for itching and rash.  Neurological: Positive for dizziness and weakness. Negative for tingling, sensory change, loss of consciousness and headaches.  Endo/Heme/Allergies: Negative for environmental allergies. Does not bruise/bleed easily.  Psychiatric/Behavioral: Negative for depression. The patient is not nervous/anxious and does not have insomnia.      Current treatment-intravesicular gemcitabine  No Known Allergies  Past Medical History:  Diagnosis Date  . AICD (automatic cardioverter/defibrillator) present EP cardiologist--- dr Caryl Comes    placement 05-07-2014 , ef 25%,  NICM/  (02-11-2019 last echo 08-31-2018 ef 60-65%)  . Arthritis    "in about all my joints; for sure in my back"  . Bladder cancer [facility]) urologist-  dr Junious Silk   dx 1995--  recurrent bladder cancer 2015 , s/p TURBT's and chemo instillation's ;   04/ 2019  s/p TURBT  . Chronic hyponatremia   . Chronic low back pain with bilateral sciatica    s/p  spinal cord stimulator @ [facility]  10-09-2018  . Chronic systolic (congestive) heart failure [facility])    cardiologist-  dr Rogue Jury  . COPD with emphysema ([place])    (02-11-2019  per pt has never been on oxygen)  . Coronary  artery disease cardiologist-  dr Kathlyn [place]   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% per cardiac cath 03-03-2014  . DDD (degenerative disc disease), thoracolumbar   . Frequent urination   . Full dentures     . Gait instability    due to chronic low back pain, uses roller walker  . GERD (gastroesophageal reflux disease)   . History of iron deficiency anemia 11/2013   resolved w/ IV Iron  (02-11-2019  per pt has not had any issues since 2015)  . History of stomach ulcers 11/2013  . Hypertension   . LBBB (left bundle branch block)   . NICM (nonischemic cardiomyopathy) ([place]) last echo 12-31-2015 ef 55-60%   dx 09/ 2015 per echo 20%;  myoview 09/ 2015 ef 28%;  per cardiac cath 12/ 2015 ef 30%;    . Nocturia more than twice per night   . S/P insertion of spinal cord stimulator followed by [facility]---- Dr Pecola Leisure (notes in care everywhere)   10-09-2018  @[facility] --- thoracic spinal cord stimular/ generator  (device from [facility])---- per pt has a control  . Scoliosis     Past Surgical History:  Procedure Laterality Date  . BI-VENTRICULAR IMPLANTABLE CARDIOVERTER DEFIBRILLATOR N/A 05/07/2014   Procedure: BI-VENTRICULAR IMPLANTABLE CARDIOVERTER DEFIBRILLATOR  (CRT-D);  Surgeon: Deboraha Sprang, MD;  Location: [facility] CATH LAB;  Service: Cardiovascular;  Laterality: N/A;  . CARDIAC CATHETERIZATION  03-03-2014  dr Kathlyn [place]   [facility]   pLAD 20%, pRCA 20%, dRCA 50%, RPLS 50%;  ef 30%, mild elevated LVEDP, mild gradient across aortic valve LVOT  . CARDIOVASCULAR STRESS TEST  11/25/2013   High risk nuclear study w/ large high severity inferior wall perfusion defect on stress and rest images, large mild severity anteroseptal wall perfusion defect on stress and rest images, No inducible ischemia/ global moderate hypokinesis, ef 28%  . CATARACT EXTRACTION W/ INTRAOCULAR LENS  IMPLANT, BILATERAL Bilateral right 12-2013 / left  02-2014  . CYSTOSCOPY W/ RETROGRADES Bilateral 01/06/2015   Procedure: CYSTOSCOPY WITH  BLADDER BIOPSY BILATERAL RETROGRADE PYELOGRAM,INSTILLATION OF MITOMYCIN C;  Surgeon: [name], MD;  Location: [facility] ORS;  Service: Urology;  Laterality: Bilateral;  . CYSTOSCOPY WITH BIOPSY N/A  10/06/2015   Procedure: CYSTO WITH BLADDER BIOPSY, FULGERATION, CHEMO IRRIGATION EPIRUBICIN IN PACU;  Surgeon: [name], MD;  Location: [facility];  Service: Urology;  Laterality: N/A;  . CYSTOSCOPY WITH BIOPSY N/A 02/12/2019   Procedure: CYSTOSCOPY WITH BIOPSY/ FULGURATION/ INSTILLATION OF GEMCITABINE, bilateral retrograde turbt greater 5cm;  Surgeon: [name], MD;  Location[facility];  Service: Urology;  Laterality: N/A;  . CYSTOSCOPY WITH FULGERATION N/A 06/30/2017   Procedure: Marland Kitchen CYSTOSCOPY WITH [place] FULGERATION/ BLADDER BIOPSY/ INSTILLATION OF EPIRUBICIN;  Surgeon: [name], MD;  Location: [facility];  Service: Urology;  Laterality: N/A;  . CYSTOSCOPY WITH RETROGRADE PYELOGRAM, URETEROSCOPY AND STENT PLACEMENT Bilateral 04/18/2014   Procedure: CYSTOSCOPY WITH RETROGRADE PYELOGRAM;  Surgeon: [name], MD;  Location: [facility];  Service: Urology;  Laterality: Bilateral;  . ESOPHAGOGASTRODUODENOSCOPY N/A 11/28/2013   Procedure: ESOPHAGOGASTRODUODENOSCOPY (EGD);  Surgeon: Arta Silence, MD;  Location: [facility] ENDOSCOPY;  Service: Endoscopy;  Laterality: N/A;  . LUMBAR [place]  1980's   "ruptured disc"  . 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 TRANSTHORACIC ECHOCARDIOGRAM  12/31/2015   dr Caryl Comes   ef [id number], grade 1 diastolic dysfunction/ mild MR/ septal motion  showed abnormal function and dyssynergy  . TRANSURETHRAL RESECTION OF BLADDER  1995  . TRANSURETHRAL RESECTION OF BLADDER TUMOR N/A 12/13/2013   Procedure: TRANSURETHRAL RESECTION OF BLADDER TUMOR (TURBT);  Surgeon: [name], MD;  Location: [facility];  Service: Urology;  Laterality: N/A;  . TRANSURETHRAL RESECTION OF BLADDER TUMOR N/A 01/17/2014   Procedure: TRANSURETHRAL RESECTION OF BLADDER TUMOR (TURBT);  Surgeon: [name], MD;  Location: [facility];  Service:  Urology;  Laterality: N/A;  . TRANSURETHRAL RESECTION OF BLADDER TUMOR N/A 04/18/2014   Procedure: TRANSURETHRAL RESECTION OF BLADDER TUMOR (TURBT), CYSTOGRAM;  Surgeon: [name], MD;  Location: [facility];  Service: Urology;  Laterality: N/A;    Social History   Socioeconomic History  . Marital status: Widowed    Spouse name: Not on file  . Number of children: Not on file  . Years of education: Not on file  . Highest education level: Not on file  Occupational History  . Not on file  Tobacco Use  . Smoking status: Current Every Day Smoker    Packs/day: 0.50    Years: 56.00    Pack years: 28.00    Types: Cigarettes  . Smokeless tobacco: Never Used  . Tobacco comment: 02-11-2019  per pt currently smokes 10 cig's per day , started smoking age 20  Substance and Sexual Activity  . Alcohol use: No  . Drug use: No  . Sexual activity: Never  Other Topics Concern  . Not on file  Social History Narrative  . Not on file   Social Determinants of Health   Financial Resource Strain:   . Difficulty of Paying Living Expenses:   Food Insecurity:   . Worried About Charity fundraiser in the Last Year:   . Arboriculturist in the Last Year:   Transportation Needs:   . Film/video editor (Medical):   Marland Kitchen Lack of Transportation (Non-Medical):   Physical Activity: Sufficiently Active  . Days of Exercise per Week: 7 days  . Minutes of Exercise per Session: 30 min  Stress: No Stress Concern Present  . Feeling of Stress : Not at all  Social Connections:   . Frequency of Communication with Friends and Family:   . Frequency of Social Gatherings with Friends and Family:   . Attends Religious Services:   . Active Member of Clubs or Organizations:   . Attends Archivist Meetings:   Marland Kitchen Marital Status:   Intimate Partner Violence:   . Fear of Current or Ex-Partner:   . Emotionally Abused:   Marland Kitchen Physically Abused:   . Sexually Abused:     Family History  Problem Relation Age  of Onset  . Cancer Mother   . Cancer Brother      Current Outpatient Medications:  .  acetaminophen (TYLENOL) 500 MG tablet, Take 500 mg by mouth every 8 (eight) hours as needed., Disp: , Rfl:  .  amitriptyline (ELAVIL) 10 MG tablet, Take 1 tablet (10 mg total) by mouth at bedtime., Disp: 30 tablet, Rfl: 2 .  amLODipine (NORVASC) 2.5 MG tablet, Take 1 tablet (2.5 mg total) by mouth daily., Disp: 90 tablet, Rfl: 3 .  gabapentin (NEURONTIN) 300 MG capsule, Three times daily as needed for back pain, Disp: 270 capsule, Rfl: 1 .  losartan (COZAAR) 100 MG tablet, Take 1 tablet (100 mg total) by mouth at bedtime., Disp: 90 tablet, Rfl: 1 .  Meth-Hyo-M Bl-Na Phos-Ph Sal (URIBEL) 118 MG CAPS, TK 1 C PO Q  6 H PRN, Disp: , Rfl:  .  metoprolol succinate (TOPROL-XL) 25 MG 24 hr tablet, Take 1 tablet (25 mg total) by mouth daily., Disp: 90 tablet, Rfl: 3 .  nitrofurantoin (MACRODANTIN) 50 MG capsule, Take 50 mg by mouth at bedtime., Disp: , Rfl:  .  oxyCODONE-acetaminophen (PERCOCET) 10-325 MG tablet, Take 1 tablet by mouth every 6 (six) hours as needed for pain. Must last 30 days., Disp: 120 tablet, Rfl: 0 .  polyethylene glycol (MIRALAX / GLYCOLAX) packet, Take 17 g by mouth daily as needed for mild constipation. , Disp: , Rfl:  .  promethazine (PHENERGAN) 12.5 MG tablet, Take 1 tablet (12.5 mg total) by mouth every 8 (eight) hours as needed for nausea or vomiting., Disp: 30 tablet, Rfl: 2 .  rosuvastatin (CRESTOR) 5 MG tablet, Take 5 mg by mouth daily. , Disp: , Rfl:  .  solifenacin (VESICARE) 5 MG tablet, Take 5 mg by mouth daily., Disp: , Rfl:  .  Vitamin D, Ergocalciferol, (DRISDOL) 50000 units CAPS capsule, TK ONE C PO Q WEEK FOR 8 WEEKS, Disp: , Rfl: 0 No current facility-administered medications for this visit.  Facility-Administered Medications Ordered in Other Visits:  .  epirubicin (ELLENCE) 50 mg in sodium chloride 0.9 % bladder instillation, 50 mg, Bladder Instillation, Once, [name], MD .  gemcitabine [facility] chemo syringe for bladder instillation 2,000 mg, 2,000 mg, Bladder Instillation, Once, [name], MD  Physical exam:  Vitals:   06/03/19 1434  BP: (!) 70/50  Pulse: 75  Resp: 18  Temp: (!) 95.4 F (35.2 C)  TempSrc: Tympanic   Physical Exam Constitutional:      Comments: Thin built, accompanied by daughter.  Wearing mask.  Sitting in recliner  HENT:     Head: Normocephalic.     Nose: No congestion.     Mouth/Throat:     Mouth: Mucous membranes are dry.  Eyes:     General: No scleral icterus.    Conjunctiva/sclera: Conjunctivae normal.  Musculoskeletal:     Comments: Rolling walker for ambulation  Skin:    General: Skin is warm and dry.  Neurological:     Mental Status: She is alert and oriented to person, place, and time.     Motor: No weakness.     Gait: Gait normal.  Psychiatric:        Mood and Affect: Mood normal.        Behavior: Behavior normal.      CMP Latest Ref Rng & Units 06/03/2019  Glucose 70 - 99 mg/dL 138(H)  BUN 8 - 23 mg/dL 12  Creatinine 0.44 - 1.00 mg/dL 0.81  Sodium 135 - 145 mmol/L 129(L)  Potassium 3.5 - 5.1 mmol/L 4.9  Chloride 98 - 111 mmol/L 97(L)  CO2 22 - 32 mmol/L 26  Calcium 8.9 - 10.3 mg/dL 8.9  Total Protein 6.5 - 8.1 g/dL 6.8  Total Bilirubin 0.3 - 1.2 mg/dL 0.6  Alkaline Phos 38 - 126 U/L 77  AST 15 - 41 U/L 43(H)  ALT 0 - 44 U/L 41   CBC Latest Ref Rng & Units 06/03/2019  WBC 4.0 - 10.5 K/uL 7.1  Hemoglobin 12.0 - 15.0 g/dL 12.9  Hematocrit 36.0 - 46.0 % 39.5  Platelets 150 - 400 K/uL 172    No images are attached to the encounter.  CUP PACEART REMOTE DEVICE CHECK  Result Date: 05/29/2019 Scheduled remote reviewed. Normal device function.  Optivol trend increasing but not at alert threshold. V-sensing  episodes appear brief SVT. 98% bi-V pacing by counters. Next remote 91 days. Felisa Bonier, RN, MSN   Assessment and plan- Patient is a 79 y.o. female diagnosed with noninvasive  bladder cancer, currently receiving intravesicular gemcitabine, who presents to symptom management clinic for hypotension and orthostasis.  1.  Orthostatic hypotension-suspect given hyponatremia likely secondary to fluid volume deficit though antihypertensives also likely contributing.  Given IV fluids in clinic today with significant improvement in symptoms and blood pressure improved from 70/50 to 116/60.  I have asked patient to hold night dose of losartan and to check her bp in the morning then follow up with cardiology or Dr. Derrel Nip. I've reached out to Dr. Olin Pia office.   2. Hyponatremia- likely secondary to fluid volume deficit. Follow up with PCP for recheck.   3. Bladder irritation- likely secondary to treatment. Reinforced use of medications prescribed by urology and encouraged patient to follow up with urology.   Disposition:  Follow up with Dr. Caryl Comes or Dr. Derrel Nip    Visit Diagnosis 1. Hypotension due to hypovolemia   2. Hyponatremia     Patient expressed understanding and was in agreement with this plan. She also understands that She can call clinic at any time with any questions, concerns, or complaints.   Thank you for allowing me to participate in the care of this very pleasant patient.   Beckey Rutter, DNP, AGNP-[facility]  CC: Dr. Derrel Nip, Dr. Caryl Comes

## 2019-06-03 NOTE — Telephone Encounter (Signed)
[facility] w/ labs & possible infusion if family is able to get her to clinic, otherwise I recommend 911 to ER for evaluation.

## 2019-06-03 NOTE — Telephone Encounter (Signed)
Spoke to patient's daughter, [name]. Patient's condition has significantly declined over the past week. She is very weak, not eating or drinking much, has increased nausea and continues to have frequency and urgency, along with some abdominal pain and chronic arthritis pain. She is experiencing some diarrhea but she attributes that to taking too much Miralax. Patient will be here at 2:00 today to have lab work done and to see Beckey Rutter, NP in the Symptom Management Clinic.

## 2019-06-06 ENCOUNTER — Encounter: Payer: Self-pay | Admitting: Emergency Medicine

## 2019-06-06 ENCOUNTER — Inpatient Hospital Stay: Payer: Medicare Other

## 2019-06-06 ENCOUNTER — Telehealth: Payer: Self-pay | Admitting: Emergency Medicine

## 2019-06-06 ENCOUNTER — Emergency Department
Admission: EM | Admit: 2019-06-06 | Discharge: 2019-06-06 | Disposition: A | Discharge status: Home / Self Care | Payer: Medicare Other | Attending: Emergency Medicine | Admitting: Emergency Medicine

## 2019-06-06 ENCOUNTER — Other Ambulatory Visit: Payer: Self-pay

## 2019-06-06 ENCOUNTER — Telehealth: Payer: Self-pay | Admitting: *Deleted

## 2019-06-06 DIAGNOSIS — I959 Hypotension, unspecified: Secondary | ICD-10-CM | POA: Diagnosis present

## 2019-06-06 DIAGNOSIS — I2510 Atherosclerotic heart disease of native coronary artery without angina pectoris: Secondary | ICD-10-CM | POA: Diagnosis not present

## 2019-06-06 DIAGNOSIS — R1030 Lower abdominal pain, unspecified: Secondary | ICD-10-CM | POA: Insufficient documentation

## 2019-06-06 DIAGNOSIS — N3090 Cystitis, unspecified without hematuria: Secondary | ICD-10-CM

## 2019-06-06 DIAGNOSIS — N3289 Other specified disorders of bladder: Secondary | ICD-10-CM

## 2019-06-06 DIAGNOSIS — Z95810 Presence of automatic (implantable) cardiac defibrillator: Secondary | ICD-10-CM | POA: Insufficient documentation

## 2019-06-06 DIAGNOSIS — I5022 Chronic systolic (congestive) heart failure: Secondary | ICD-10-CM | POA: Insufficient documentation

## 2019-06-06 DIAGNOSIS — C679 Malignant neoplasm of bladder, unspecified: Secondary | ICD-10-CM | POA: Insufficient documentation

## 2019-06-06 DIAGNOSIS — R42 Dizziness and giddiness: Secondary | ICD-10-CM | POA: Diagnosis not present

## 2019-06-06 DIAGNOSIS — F17210 Nicotine dependence, cigarettes, uncomplicated: Secondary | ICD-10-CM | POA: Diagnosis not present

## 2019-06-06 DIAGNOSIS — Z79899 Other long term (current) drug therapy: Secondary | ICD-10-CM | POA: Diagnosis not present

## 2019-06-06 DIAGNOSIS — R531 Weakness: Secondary | ICD-10-CM | POA: Insufficient documentation

## 2019-06-06 DIAGNOSIS — I110 Hypertensive heart disease with heart failure: Secondary | ICD-10-CM | POA: Diagnosis not present

## 2019-06-06 LAB — CBC
HCT: 38 % (ref 36.0–46.0)
Hemoglobin: 12.8 g/dL (ref 12.0–15.0)
MCH: 31.7 pg (ref 26.0–34.0)
MCHC: 33.7 g/dL (ref 30.0–36.0)
MCV: 94.1 fL (ref 80.0–100.0)
Platelets: 146 10*3/uL — ABNORMAL LOW (ref 150–400)
RBC: 4.04 MIL/uL (ref 3.87–5.11)
RDW: 15.2 % (ref 11.5–15.5)
WBC: 4.6 10*3/uL (ref 4.0–10.5)
nRBC: 0 % (ref 0.0–0.2)

## 2019-06-06 LAB — BASIC METABOLIC PANEL
Anion gap: 8 (ref 5–15)
BUN: 13 mg/dL (ref 8–23)
CO2: 28 mmol/L (ref 22–32)
Calcium: 9.3 mg/dL (ref 8.9–10.3)
Chloride: 95 mmol/L — ABNORMAL LOW (ref 98–111)
Creatinine, Ser: 0.72 mg/dL (ref 0.44–1.00)
GFR calc Af Amer: >60 mL/min (ref >60)
GFR calc non Af Amer: >60 mL/min (ref >60)
Glucose, Bld: 101 mg/dL — ABNORMAL HIGH (ref 70–99)
Potassium: 4.5 mmol/L (ref 3.5–5.1)
Sodium: 131 mmol/L — ABNORMAL LOW (ref 135–145)

## 2019-06-06 LAB — URINALYSIS, COMPLETE (UACMP) WITH MICROSCOPIC
Bacteria, UA: NONE SEEN
Bilirubin Urine: NEGATIVE
Glucose, UA: NEGATIVE mg/dL
Ketones, ur: NEGATIVE mg/dL
Nitrite: NEGATIVE
Protein, ur: >=300 mg/dL — AB
RBC / HPF: >50 RBC/hpf — ABNORMAL HIGH (ref 0–5)
Specific Gravity, Urine: 1.011 (ref 1.005–1.030)
WBC, UA: >50 WBC/hpf — ABNORMAL HIGH (ref 0–5)
pH: 7 (ref 5.0–8.0)

## 2019-06-06 MED ORDER — OXYCODONE-ACETAMINOPHEN 5-325 MG PO TABS
2.0000 | ORAL_TABLET | Freq: Once | ORAL | Status: AC
  Administered 2019-06-06: 2 via ORAL
  Filled 2019-06-06: qty 2

## 2019-06-06 MED ORDER — SODIUM CHLORIDE 0.9 % IV SOLN
2.0000 g | Freq: Once | INTRAVENOUS | Status: AC
  Administered 2019-06-06: 14:00:00 2 g via INTRAVENOUS
  Filled 2019-06-06: qty 20

## 2019-06-06 MED ORDER — METHYLPREDNISOLONE 4 MG PO TBPK: ORAL_TABLET | ORAL | 0 refills | Status: DC

## 2019-06-06 MED ORDER — BELLADONNA ALKALOIDS-OPIUM 16.2-60 MG RE SUPP
0.5000 | Freq: Once | RECTAL | Status: AC
  Administered 2019-06-06: 0.5 via RECTAL
  Filled 2019-06-06: qty 1

## 2019-06-06 MED ORDER — FENTANYL CITRATE (PF) 100 MCG/2ML IJ SOLN
50.0000 ug | Freq: Once | INTRAMUSCULAR | Status: AC
  Administered 2019-06-06: 50 ug via INTRAVENOUS
  Filled 2019-06-06: qty 2

## 2019-06-06 MED ORDER — LACTATED RINGERS IV BOLUS: 500.0000 mL | Freq: Once | INTRAVENOUS | Status: DC

## 2019-06-06 MED ORDER — LACTATED RINGERS IV BOLUS
250.0000 mL | Freq: Once | INTRAVENOUS | Status: AC
  Administered 2019-06-06: 250 mL via INTRAVENOUS

## 2019-06-06 MED ORDER — CEFPODOXIME PROXETIL 200 MG PO TABS: 100.0000 mg | ORAL_TABLET | Freq: Two times a day (BID) | ORAL | 0 refills | Status: AC

## 2019-06-06 MED ORDER — METHYLPREDNISOLONE SODIUM SUCC 125 MG IJ SOLR
60.0000 mg | Freq: Once | INTRAMUSCULAR | Status: AC
  Administered 2019-06-06: 60 mg via INTRAVENOUS
  Filled 2019-06-06: qty 2

## 2019-06-06 MED ORDER — SODIUM CHLORIDE 0.9% FLUSH: 3.0000 mL | Freq: Once | INTRAVENOUS | Status: DC

## 2019-06-06 NOTE — Telephone Encounter (Signed)
Called pt's daughter [name] to let her know that Dr. Grayland Ormond stated that from his perspective we wouldn't be able to do anymore treatment as she hasn't been tolerating the treatment. Also relayed that pt should go to the ER, either by having her daughter taking her or by calling EMS, as pt has orthostatic changes with her systolic pressure dropping into the 80s when standing, she has loss of appetite, incontinence that is pretty much nonstop, and is having continued pelvic pain. Also told [name] that if patient doesn't go to the ER, that Yosselin needs to follow up with urology in 1-2 weeks. [name] verbalized understanding, all questions answered.

## 2019-06-06 NOTE — Telephone Encounter (Signed)
I called daughter. She is going to try and get her to the ER

## 2019-06-06 NOTE — ED Triage Notes (Signed)
Pt daughter reports that her cancer MD advised her to come to the ED due to low BP. Pt undergoing treatment for bladder cancer.

## 2019-06-06 NOTE — ED Provider Notes (Signed)
[facility] Emergency Department Provider Note  ____________________________________________   First MD Initiated Contact with Patient 06/06/19 1245     (approximate)  I have reviewed the triage vital signs and the nursing notes.   HISTORY  Chief Complaint Hypotension    HPI [name] is a 79 y.o. female  Here with bladder pain and spasm. Pt reports that since receiving her most recent intravesicular gemcitabine, she has had ongoing, increasingly severe urinary frequency and urgency. She feels like she has to go to the bathroom constantly with production of only drops of urine each time. She's had associated fatigue, poor appetite. She admits to decreased PO intake 2/2 this frequency but did try to drink more water yesterday. She's had associated worsening weakness and lightheadedness with standing. Noted to become orthostatic today with ongoing sx so told to come to the ED. She has been on ABX, denies any flank pain, fevers. Mild nausea but no vomiting. No other acute complaints.        Past Medical History:  Diagnosis Date  . AICD (automatic cardioverter/defibrillator) present EP cardiologist--- dr Caryl Comes    placement 05-07-2014 , ef 25%,  NICM/  (02-11-2019 last echo 08-31-2018 ef 60-65%)  . Arthritis    "in about all my joints; for sure in my back"  . Bladder cancer [facility]) urologist-  dr Junious Silk   dx 1995--  recurrent bladder cancer 2015 , s/p TURBT's and chemo instillation's ;   04/ 2019  s/p TURBT  . Chronic hyponatremia   . Chronic low back pain with bilateral sciatica    s/p  spinal cord stimulator @ [facility]  10-09-2018  . Chronic systolic (congestive) heart failure [facility])    cardiologist-  dr Rogue Jury  . COPD with emphysema ([place])    (02-11-2019  per pt has never been on oxygen)  . Coronary artery disease cardiologist-  dr Kathlyn [place]   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% per cardiac cath 03-03-2014  . DDD (degenerative disc disease),  thoracolumbar   . Frequent urination   . Full dentures   . Gait instability    due to chronic low back pain, uses roller walker  . GERD (gastroesophageal reflux disease)   . History of iron deficiency anemia 11/2013   resolved w/ IV Iron  (02-11-2019  per pt has not had any issues since 2015)  . History of stomach ulcers 11/2013  . Hypertension   . LBBB (left bundle branch block)   . NICM (nonischemic cardiomyopathy) ([place]) last echo 12-31-2015 ef 55-60%   dx 09/ 2015 per echo 20%;  myoview 09/ 2015 ef 28%;  per cardiac cath 12/ 2015 ef 30%;    . Nocturia more than twice per night   . S/P insertion of spinal cord stimulator followed by [facility]---- Dr Pecola Leisure (notes in care everywhere)   10-09-2018  @[facility] --- thoracic spinal cord stimular/ generator  (device from [facility])---- per pt has a control  . Scoliosis     Patient Active Problem List   Diagnosis Date Noted  . Lumbar facet arthropathy 02/07/2019  . Osteoporosis 08/28/2018  . Educated about COVID-19 virus infection 07/21/2018  . PAD (peripheral artery disease) ([place]) 07/13/2018  . Unintentional weight loss 06/09/2018  . Chronic pain syndrome 02/06/2018  . Lumbar radiculopathy 02/06/2018  . Lumbar spondylosis 07/11/2017  . SI joint arthritis 07/11/2017  . Lumbar degenerative disc disease 07/11/2017  . Hyperlipidemia 03/26/2017  . S/P placement of cardiac pacemaker 03/26/2017  . Coronary  artery disease 03/26/2017  . 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 Hypertension 09/06/2016  . Spinal stenosis, lumbar region, 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/18/2016  . Other idiopathic scoliosis, lumbar region 08/18/2016  . NSAID induced gastritis 05/26/2016  . Chronic bilateral low back pain 05/15/2016  . Spinal stenosis in cervical region 01/19/2014  . Constipation 12/29/2013  . Spinal stenosis of thoracolumbar region 12/27/2013  . COPD (chronic obstructive pulmonary disease) ([place]) 12/27/2013  . History of gastric ulcer 11/28/2013    . Congestive dilated cardiomyopathy ([place]) 11/25/2013  . Bladder cancer ([place]) 11/24/2013  . LBBB (left bundle branch block) 11/24/2013  . Atrial ectopy 11/24/2013  . Hyponatremia 11/22/2013    Past Surgical History:  Procedure Laterality Date  . BI-VENTRICULAR IMPLANTABLE CARDIOVERTER DEFIBRILLATOR N/A 05/07/2014   Procedure: BI-VENTRICULAR IMPLANTABLE CARDIOVERTER DEFIBRILLATOR  (CRT-D);  Surgeon: Deboraha Sprang, MD;  Location: [facility] CATH LAB;  Service: Cardiovascular;  Laterality: N/A;  . CARDIAC CATHETERIZATION  03-03-2014  dr Kathlyn Sacramento   [facility]   pLAD 20%, pRCA 20%, dRCA 50%, RPLS 50%;  ef 30%, mild elevated LVEDP, mild gradient across aortic valve LVOT  . CARDIOVASCULAR STRESS TEST  11/25/2013   High risk nuclear study w/ large high severity inferior wall perfusion defect on stress and rest images, large mild severity anteroseptal wall perfusion defect on stress and rest images, No inducible ischemia/ global moderate hypokinesis, ef 28%  . CATARACT EXTRACTION W/ INTRAOCULAR LENS  IMPLANT, BILATERAL Bilateral right 12-2013 / left  02-2014  . CYSTOSCOPY W/ RETROGRADES Bilateral 01/06/2015   Procedure: CYSTOSCOPY WITH  BLADDER BIOPSY BILATERAL RETROGRADE PYELOGRAM,INSTILLATION OF MITOMYCIN C;  Surgeon: [name], MD;  Location: [facility] ORS;  Service: Urology;  Laterality: Bilateral;  . CYSTOSCOPY WITH BIOPSY N/A 10/06/2015   Procedure: CYSTO WITH BLADDER BIOPSY, FULGERATION, CHEMO IRRIGATION EPIRUBICIN IN PACU;  Surgeon: [name], MD;  Location: [facility] ORS;  Service: Urology;  Laterality: N/A;  . CYSTOSCOPY WITH BIOPSY N/A 02/12/2019   Procedure: CYSTOSCOPY WITH BIOPSY/ FULGURATION/ INSTILLATION OF GEMCITABINE, bilateral retrograde turbt greater 5cm;  Surgeon: [name], MD;  Location: [facility];  Service: Urology;  Laterality: N/A;  . CYSTOSCOPY WITH FULGERATION N/A 06/30/2017   Procedure: Marland Kitchen CYSTOSCOPY WITH [place] FULGERATION/ BLADDER BIOPSY/  INSTILLATION OF EPIRUBICIN;  Surgeon: [name], MD;  Location: [facility];  Service: Urology;  Laterality: N/A;  . CYSTOSCOPY WITH RETROGRADE PYELOGRAM, URETEROSCOPY AND STENT PLACEMENT Bilateral 04/18/2014   Procedure: CYSTOSCOPY WITH RETROGRADE PYELOGRAM;  Surgeon: [name], MD;  Location: [facility]S;  Service: Urology;  Laterality: Bilateral;  . ESOPHAGOGASTRODUODENOSCOPY N/A 11/28/2013   Procedure: ESOPHAGOGASTRODUODENOSCOPY (EGD);  Surgeon: Arta Silence, MD;  Location: [facility] ENDOSCOPY;  Service: Endoscopy;  Laterality: N/A;  . LUMBAR [place]  1980's   "ruptured disc"  . 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 TRANSTHORACIC ECHOCARDIOGRAM  12/31/2015   dr Caryl Comes   ef [id number], grade 1 diastolic dysfunction/ mild MR/ septal motion showed abnormal function and dyssynergy  . TRANSURETHRAL RESECTION OF BLADDER  1995  . TRANSURETHRAL RESECTION OF BLADDER TUMOR N/A 12/13/2013   Procedure: TRANSURETHRAL RESECTION OF BLADDER TUMOR (TURBT);  Surgeon: [name], MD;  Location: [facility];  Service: Urology;  Laterality: N/A;  . TRANSURETHRAL RESECTION OF BLADDER TUMOR N/A 01/17/2014   Procedure: TRANSURETHRAL RESECTION OF BLADDER TUMOR (TURBT);  Surgeon: [name], MD;  Location: [facility];  Service: Urology;  Laterality: N/A;  .  TRANSURETHRAL RESECTION OF BLADDER TUMOR N/A 04/18/2014   Procedure: TRANSURETHRAL RESECTION OF BLADDER TUMOR (TURBT), CYSTOGRAM;  Surgeon: [name], MD;  Location: [facility];  Service: Urology;  Laterality: N/A;    Prior to Admission medications   Medication Sig Start Date End Date Taking? Authorizing Provider  acetaminophen (TYLENOL) 500 MG tablet Take 500 mg by mouth every 8 (eight) hours as needed.    [provider]  amitriptyline (ELAVIL) 10 MG tablet Take 1 tablet (10 mg total) by mouth at bedtime. 03/26/19   [name],  MD  amLODipine (NORVASC) 2.5 MG tablet Take 1 tablet (2.5 mg total) by mouth daily. 03/26/19   Deboraha Sprang, MD  cefpodoxime (VANTIN) 200 MG tablet Take 0.5 tablets (100 mg total) by mouth 2 (two) times daily for 7 days. 06/06/19 06/13/19  Duffy Bruce, MD  gabapentin (NEURONTIN) 300 MG capsule Three times daily as needed for back pain 03/28/19   [name], MD  losartan (COZAAR) 100 MG tablet Take 1 tablet (100 mg total) by mouth at bedtime. 07/18/18   [name], MD  Meth-Hyo-M Barnett Hatter Phos-Ph Sal (URIBEL) 118 MG CAPS TK 1 C PO Q 6 H PRN 02/19/19   [provider]  methylPREDNISolone (MEDROL DOSEPAK) 4 MG TBPK tablet Take as directed on packaging 06/06/19   Duffy Bruce, MD  metoprolol succinate (TOPROL-XL) 25 MG 24 hr tablet Take 1 tablet (25 mg total) by mouth daily. 03/26/19   Deboraha Sprang, MD  oxyCODONE-acetaminophen (PERCOCET) 10-325 MG tablet Take 1 tablet by mouth every 6 (six) hours as needed for pain. Must last 30 days. 05/25/19 06/24/19  [name], MD  phenazopyridine (PYRIDIUM) 200 MG tablet Take 200 mg by mouth 3 (three) times daily as needed. 05/28/19   [name], MD  polyethylene glycol (MIRALAX / Floria Raveling) packet Take 17 g by mouth daily as needed for mild constipation.     [provider]  promethazine (PHENERGAN) 12.5 MG tablet Take 1 tablet (12.5 mg total) by mouth every 8 (eight) hours as needed for nausea or vomiting. 04/12/19   [name], MD  rosuvastatin (CRESTOR) 5 MG tablet Take 5 mg by mouth daily.     [provider]  solifenacin (VESICARE) 5 MG tablet Take 5 mg by mouth daily.    [provider]  Vitamin D, Ergocalciferol, (DRISDOL) 50000 units CAPS capsule TK ONE C PO Q WEEK FOR 8 WEEKS 11/16/17   [provider]    Allergies Patient has no known allergies.  Family History  Problem Relation Age of Onset  . Cancer Mother   . Cancer Brother     Social History Social History   Tobacco Use  .  Smoking status: Current Every Day Smoker    Packs/day: 0.50    Years: 56.00    Pack years: 28.00    Types: Cigarettes  . Smokeless tobacco: Never Used  . Tobacco comment: 02-11-2019  per pt currently smokes 10 cig's per day , started smoking age 71  Substance Use Topics  . Alcohol use: No  . Drug use: No    Review of Systems  Review of Systems  Constitutional: Negative for fatigue and fever.  HENT: Negative for congestion and sore throat.   Eyes: Negative for visual disturbance.  Respiratory: Negative for cough and shortness of breath.   Cardiovascular: Negative for chest pain.  Gastrointestinal: Negative for abdominal pain, diarrhea, nausea and vomiting.  Genitourinary: Positive for dysuria, frequency, pelvic pain and  urgency. Negative for flank pain.  Musculoskeletal: Negative for back pain and neck pain.  Skin: Negative for rash and wound.  Neurological: Negative for weakness.  All other systems reviewed and are negative.    ____________________________________________  PHYSICAL EXAM:      VITAL SIGNS: ED Triage Vitals  Enc Vitals Group     BP 06/06/19 1008 111/75     Pulse Rate 06/06/19 1008 70     Resp 06/06/19 1008 18     Temp 06/06/19 1008 98.4 F (36.9 C)     Temp Source 06/06/19 1008 Oral     SpO2 06/06/19 1008 98 %     Weight 06/06/19 1009 120 lb (54.4 kg)     Height 06/06/19 1009 5\' 8"  (1.727 m)     Head Circumference --      Peak Flow --      Pain Score --      Pain Loc --      Pain Edu? --      Excl. in [place]? --      Physical Exam Vitals and nursing note reviewed.  Constitutional:      General: She is not in acute distress.    Appearance: She is well-developed.  HENT:     Head: Normocephalic and atraumatic.     Left Ear: Tympanic membrane normal.  Eyes:     Conjunctiva/sclera: Conjunctivae normal.  Cardiovascular:     Rate and Rhythm: Normal rate and regular rhythm.     Heart sounds: Normal heart sounds.  Pulmonary:     Effort: Pulmonary  effort is normal. No respiratory distress.     Breath sounds: No wheezing.  Abdominal:     General: Abdomen is flat. There is no distension.     Tenderness: There is abdominal tenderness (mild suprapubic).     Comments: No CVAT bilaterally  Musculoskeletal:     Cervical back: Neck supple.  Skin:    General: Skin is warm.     Capillary Refill: Capillary refill takes less than 2 seconds.     Findings: No rash.  Neurological:     Mental Status: She is alert and oriented to person, place, and time.     Motor: No abnormal muscle tone.       ____________________________________________   LABS (all labs ordered are listed, but only abnormal results are displayed)  Labs Reviewed  BASIC METABOLIC PANEL - Abnormal; Notable for the following components:      Result Value   Sodium 131 (*)    Chloride 95 (*)    Glucose, Bld 101 (*)    All other components within normal limits  CBC - Abnormal; Notable for the following components:   Platelets 146 (*)    All other components within normal limits  URINALYSIS, COMPLETE (UACMP) WITH MICROSCOPIC - Abnormal; Notable for the following components:   Color, Urine YELLOW (*)    APPearance HAZY (*)    Hgb urine dipstick MODERATE (*)    Protein, ur >=300 (*)    Leukocytes,Ua SMALL (*)    RBC / HPF >50 (*)    WBC, UA >50 (*)    All other components within normal limits  URINE CULTURE  CBG MONITORING, ED    ____________________________________________  EKG: Atrial sensed ventricular paced rhythm, with occasional pACs. VR 67. No Sgarbossa criteria. No acute ST elevations or depressions.    EKG Interpretation  Date/Time:  Thursday June 06 2019 10:58:13 EDT Ventricular Rate:  67 PR Interval:  150 QRS Duration: 112 QT Interval:  446 QTC Calculation: 471 R Axis:   -125 Text Interpretation: Sinus rhythm with frequent ventricular-paced complexes and with occasional Premature ventricular complexes Right superior axis deviation Pulmonary  disease pattern Possible Right ventricular hypertrophy Abnormal ECG When compared with ECG of 02-Oct-2015 14:13, Fusion complexes are no longer Present Premature ventricular complexes are now Present Vent. rate has decreased BY   5 BPM Confirmed by UNCONFIRMED, DOCTOR ([id number]), editor Mel Almond, Tammy [id number]) on 06/06/2019 11:44:43 AM       ________________________________________  RADIOLOGY All imaging, including plain films, CT scans, and ultrasounds, independently reviewed by me, and interpretations confirmed via formal radiology reads.  ED MD interpretation:   None  Official radiology report(s): No results found.  ____________________________________________  PROCEDURES   Procedure(s) performed (including Critical Care):  .1-3 Lead EKG Interpretation Performed by: Duffy Bruce, MD Authorized by: Duffy Bruce, MD     Interpretation: abnormal     ECG rate:  60-70   ECG rate assessment: normal     Rhythm: paced     Ectopy: PVCs     Conduction: abnormal   Comments:     Indication: Weakness, h/o heart failure and arrhythmia    ____________________________________________  INITIAL IMPRESSION / MDM / ASSESSMENT AND PLAN / ED COURSE  As part of my medical decision making, I reviewed the following data within the [place] notes reviewed and incorporated, Old chart reviewed, Notes from prior ED visits, and [place] Controlled Substance Database       *JAVITA SORRELLS was evaluated in Emergency Department on 06/06/2019 for the symptoms described in the history of present illness. She was evaluated in the context of the global COVID-19 pandemic, which necessitated consideration that the patient might be at risk for infection with the SARS-CoV-2 virus that causes COVID-19. Institutional protocols and algorithms that pertain to the evaluation of patients at risk for COVID-19 are in a state of rapid change based on information released by regulatory bodies  including the CDC and federal and state organizations. These policies and algorithms were followed during the patient's care in the ED.  Some ED evaluations and interventions may be delayed as a result of limited staffing during the pandemic.*     Medical Decision Making:  79 yo F with PMHx as above here with mild weakness, now improved. Pt was noted to be orthostatic at home today with c/o increasing suprapubic pain, pressure, urgency, and dysuria. Suspect ongoing acute cystitis, likely multifactorial 2/2 possible UTI as well as chemotherapy-related cystitis from intravesicular gemicitabine. Pt has no flank pain, fever, nausea, vomiting, leukocytosis, or evidence to suggest pyelonephritis or other ascending infection. Her abd is otherwise soft. No CP, SOB, and telemetry/EKG is at baseline and reassuring without apparent cardiopulmonary abnormality. Pt is well appearing here.  Discussed case with Dr. Diamantina Providence, who recommends steroid dosepak along with continued supportive care. Pt has been on macrobid 50 daily - wil escalate to vantin 100 BID x 7 days for treatment dose and course. Will also give dose of Rocephin here. I also discussed with Dr. Grayland Ormond - pt is going to stop intravesicular chemo at this time and he states management can be as per Urology.  Pt given gentle fluids given her cardiac history, but I do suspect she was mildly dehydrated from poor PO intake related to trying to decrease her urinary output 2/2 her irritation and symptoms. Will continue her antispasm medications, start on dosepak, and steroids as above. Will have  her hold her losartan tonight 2/2 questionable orthostasis and resume tomorrow.  ____________________________________________  FINAL CLINICAL IMPRESSION(S) / ED DIAGNOSES  Final diagnoses:  Bladder spasm  Cystitis     MEDICATIONS GIVEN DURING THIS VISIT:  Medications  sodium chloride flush (NS) 0.9 % injection 3 mL (3 mLs Intravenous Not Given 06/06/19 1320)    opium-belladonna (B&O SUPPRETTES) 16.2-60 MG suppository 0.5 suppository (has no administration in time range)  lactated ringers bolus 250 mL (0 mLs Intravenous Stopped 06/06/19 1429)  methylPREDNISolone sodium succinate (SOLU-MEDROL) 125 mg/2 mL injection 60 mg (60 mg Intravenous Given 06/06/19 1432)  cefTRIAXone (ROCEPHIN) 2 g in sodium chloride 0.9 % 100 mL IVPB (0 g Intravenous Stopped 06/06/19 1645)     ED Discharge Orders         Ordered    cefpodoxime (VANTIN) 200 MG tablet  2 times daily     06/06/19 1458    methylPREDNISolone (MEDROL DOSEPAK) 4 MG TBPK tablet     06/06/19 1458           Note:  This document was prepared using Dragon voice recognition software and may include unintentional dictation errors.   Duffy Bruce, MD 06/06/19 [phone]

## 2019-06-06 NOTE — ED Notes (Signed)
Awaiting last med from pharmacy for d/c. Called to verify medication is being delivered

## 2019-06-06 NOTE — Discharge Instructions (Addendum)
For your infection: - STOP taking the nitrofurantoin (macrobid) for now - START taking the Cefpodxime twice a day for 7 days. You can start this tomorrow morning - START taking the Medrol dosepak tomorrow  For your blood pressure: - HOLD your Losartan dose today - Then resume all your medications tomorrow

## 2019-06-06 NOTE — Telephone Encounter (Signed)
Pt has likely received a much treatment as we can give her.  Please ensure she has f/u with urology in the next 1-2 weeks.  No  further f/u needed at this time.

## 2019-06-06 NOTE — Telephone Encounter (Signed)
Call returned to [place] and left message on her voice mail with doctor response and asked that she call me back to confirm receipt of message.

## 2019-06-06 NOTE — Telephone Encounter (Signed)
Daughter called reporting that patient is unable to come in and get her treatment today due to being too weak to come in even with assistance and would like to reschedule appointment

## 2019-06-07 LAB — URINE CULTURE: Culture: NO GROWTH

## 2019-06-11 ENCOUNTER — Telehealth: Payer: Self-pay | Admitting: *Deleted

## 2019-06-11 ENCOUNTER — Telehealth: Payer: Self-pay | Admitting: Internal Medicine

## 2019-06-11 NOTE — Telephone Encounter (Signed)
We have recommended discontinuing treatments and f/u with urology.

## 2019-06-11 NOTE — Telephone Encounter (Signed)
Patient called reporting that she was in ER this week for cystitis and Bladder spasms and was told to call our office or Dr Lyndal Rainbow office to find out where to go with her treatments now. Please advise

## 2019-06-11 NOTE — Telephone Encounter (Signed)
I spoke with the patient earlier today. She states she had been "sick" recently. She was in the ER on 06/06/19 with cystitis and was told she was hypotensive when she stood up. I inquired from the patient if the BP readings she called in today were from before or after she typically takes her medications. The patient's states she wasn't sure as she had not recorded the times. She was also not sure if any of the readings were standing readings.  I confirmed with the patient she is taking: - amlodipine 2.5 mg once daily in the AM - metoprolol succinate 25 mg once daily in the AM - losartan 100 mg once daily in the PM  The patient reports occasional dizziness with standing.   I reviewed the patient's readings with Dr. Caryl Comes. MD recommendations are to have the patient record her BP/ HR readings for the next 5 days and write down the times she is taking these & to also record sitting & standing BP (HR).  I have notified the patient of the above recommendations. I have asked her to take her BP (HR) for the next 5 days in the morning ~ 1 hour after she takes her morning meds. I have also asked her to sit for 10 minutes and then take a sitting BP (HR), then stand and immediately take an additional BP (HR) reading.  I have asked that she take these orthostatic readings 1-2 times over the next 5 days.  The patient is aware I will ask a triage nurse to call her on Monday 3/29 in my absence to follow up on her readings so I can review these with Dr. Caryl Comes on 3/30. The patient voices understanding of the above recommendations and is agreeable.

## 2019-06-11 NOTE — Telephone Encounter (Signed)
Patient states that she was in the ED with low BP on 3/18. Pt c/o BP issue: STAT if pt c/o blurred vision, one-sided weakness or slurred speech  1. What are your last 5 BP readings?  3/19- 149/85, 133/86 HR 79 3/20- 160/87 HR 65 , 120/74 HR 64 3/21- 176/93 HR 67, 161/85 HR 68 3/22-114/82 HR 75 168/96 HR 73 3/23- 159/96 HR 91 144/96 HR 86  2. Are you having any other symptoms (ex. Dizziness, headache, blurred vision, passed out)?  Dizziness from standing after sitting  3. What is your BP issue? elevated

## 2019-06-11 NOTE — Telephone Encounter (Signed)
Call returned to patient and advised of doctor response and advised to contact Urology.

## 2019-06-13 NOTE — Telephone Encounter (Signed)
Sending to triage to please call the patient on Monday to follow up on her BP's.

## 2019-06-15 DIAGNOSIS — K6289 Other specified diseases of anus and rectum: Secondary | ICD-10-CM | POA: Diagnosis not present

## 2019-06-15 DIAGNOSIS — H6011 Cellulitis of right external ear: Secondary | ICD-10-CM | POA: Diagnosis not present

## 2019-06-17 ENCOUNTER — Other Ambulatory Visit: Payer: Self-pay

## 2019-06-17 ENCOUNTER — Telehealth: Payer: Self-pay | Admitting: Internal Medicine

## 2019-06-17 ENCOUNTER — Emergency Department: Payer: Medicare Other

## 2019-06-17 ENCOUNTER — Emergency Department
Admission: EM | Admit: 2019-06-17 | Discharge: 2019-06-17 | Disposition: A | Discharge status: Home / Self Care | Payer: Medicare Other | Attending: Emergency Medicine | Admitting: Emergency Medicine

## 2019-06-17 DIAGNOSIS — Z79899 Other long term (current) drug therapy: Secondary | ICD-10-CM | POA: Insufficient documentation

## 2019-06-17 DIAGNOSIS — F17210 Nicotine dependence, cigarettes, uncomplicated: Secondary | ICD-10-CM | POA: Diagnosis not present

## 2019-06-17 DIAGNOSIS — R531 Weakness: Secondary | ICD-10-CM | POA: Insufficient documentation

## 2019-06-17 DIAGNOSIS — Z9221 Personal history of antineoplastic chemotherapy: Secondary | ICD-10-CM | POA: Diagnosis not present

## 2019-06-17 DIAGNOSIS — I110 Hypertensive heart disease with heart failure: Secondary | ICD-10-CM | POA: Insufficient documentation

## 2019-06-17 DIAGNOSIS — R5383 Other fatigue: Secondary | ICD-10-CM | POA: Diagnosis not present

## 2019-06-17 DIAGNOSIS — Z8551 Personal history of malignant neoplasm of bladder: Secondary | ICD-10-CM | POA: Insufficient documentation

## 2019-06-17 DIAGNOSIS — K6289 Other specified diseases of anus and rectum: Secondary | ICD-10-CM | POA: Diagnosis not present

## 2019-06-17 DIAGNOSIS — I2510 Atherosclerotic heart disease of native coronary artery without angina pectoris: Secondary | ICD-10-CM | POA: Insufficient documentation

## 2019-06-17 DIAGNOSIS — Z95810 Presence of automatic (implantable) cardiac defibrillator: Secondary | ICD-10-CM | POA: Insufficient documentation

## 2019-06-17 DIAGNOSIS — J449 Chronic obstructive pulmonary disease, unspecified: Secondary | ICD-10-CM | POA: Insufficient documentation

## 2019-06-17 DIAGNOSIS — I5022 Chronic systolic (congestive) heart failure: Secondary | ICD-10-CM | POA: Insufficient documentation

## 2019-06-17 DIAGNOSIS — Z20822 Contact with and (suspected) exposure to covid-19: Secondary | ICD-10-CM | POA: Diagnosis not present

## 2019-06-17 DIAGNOSIS — K5900 Constipation, unspecified: Secondary | ICD-10-CM | POA: Diagnosis not present

## 2019-06-17 LAB — URINALYSIS, COMPLETE (UACMP) WITH MICROSCOPIC
Bacteria, UA: NONE SEEN
Bilirubin Urine: NEGATIVE
Glucose, UA: NEGATIVE mg/dL
Hgb urine dipstick: NEGATIVE
Ketones, ur: NEGATIVE mg/dL
Nitrite: NEGATIVE
Protein, ur: 30 mg/dL — AB
Specific Gravity, Urine: 1.006 (ref 1.005–1.030)
pH: 7 (ref 5.0–8.0)

## 2019-06-17 LAB — BASIC METABOLIC PANEL
Anion gap: 10 (ref 5–15)
BUN: 9 mg/dL (ref 8–23)
CO2: 27 mmol/L (ref 22–32)
Calcium: 9.2 mg/dL (ref 8.9–10.3)
Chloride: 98 mmol/L (ref 98–111)
Creatinine, Ser: 0.6 mg/dL (ref 0.44–1.00)
GFR calc Af Amer: >60 mL/min (ref >60)
GFR calc non Af Amer: >60 mL/min (ref >60)
Glucose, Bld: 84 mg/dL (ref 70–99)
Potassium: 3.6 mmol/L (ref 3.5–5.1)
Sodium: 135 mmol/L (ref 135–145)

## 2019-06-17 LAB — CBC
HCT: 41.8 % (ref 36.0–46.0)
Hemoglobin: 13.8 g/dL (ref 12.0–15.0)
MCH: 31.9 pg (ref 26.0–34.0)
MCHC: 33 g/dL (ref 30.0–36.0)
MCV: 96.5 fL (ref 80.0–100.0)
Platelets: 162 10*3/uL (ref 150–400)
RBC: 4.33 MIL/uL (ref 3.87–5.11)
RDW: 16.5 % — ABNORMAL HIGH (ref 11.5–15.5)
WBC: 7.6 10*3/uL (ref 4.0–10.5)
nRBC: 0 % (ref 0.0–0.2)

## 2019-06-17 LAB — POC SARS CORONAVIRUS 2 AG: SARS Coronavirus 2 Ag: NEGATIVE

## 2019-06-17 LAB — TROPONIN I (HIGH SENSITIVITY): Troponin I (High Sensitivity): 11 ng/L (ref <18)

## 2019-06-17 MED ORDER — SODIUM CHLORIDE 0.9% FLUSH: 3.0000 mL | Freq: Once | INTRAVENOUS | Status: DC

## 2019-06-17 MED ORDER — SODIUM CHLORIDE 0.9 % IV BOLUS
1000.0000 mL | Freq: Once | INTRAVENOUS | Status: AC
  Administered 2019-06-17: 1000 mL via INTRAVENOUS

## 2019-06-17 NOTE — Telephone Encounter (Signed)
Called patient and no answer. Upon looking in her chart, patient presented to the ED today and is currently there.

## 2019-06-17 NOTE — ED Notes (Signed)
See triage note, pt to ER for increased weakness that started this morning. Pt states she typically uses a walker at home, having difficulty walking today d/t feeling weak. Pt has equal grip/stregth bilaterally. Daughter at bedside

## 2019-06-17 NOTE — ED Provider Notes (Signed)
[facility] Emergency Department Provider Note  Time seen: 2:57 PM  I have reviewed the triage vital signs and the nursing notes.   HISTORY  Chief Complaint Weakness  HPI [name] is a 79 y.o. female with a past medical history of bladder cancer, 2-1/2 weeks status post chemo, CHF, COPD, gastric reflux,  presents to the emergency department for generalized fatigue/weakness.  According to the daughter with whom the patient lives for the past several days she has noted a significant decline in the patient's energy and activity level.  Today the patient was not able to perform her normal daily living activities such as cleaning or dressing herself.  Daughter was concerned due to the degree of weakness and brought her to the emergency department for evaluation.  Here the patient is awake alert and oriented she does appear somewhat fatigued but is able to answer questions appropriately and follow commands.  Largely negative review of systems denies any abdominal pain chest pain, nausea vomiting diarrhea or dysuria.  Does state occasional rectal pain but this has been an ongoing issue since starting chemotherapy her last bowel movement was 1 to 2 days ago per patient which is fairly typical.  Denies any cough.  Has not received Covid vaccinations nor has she had coronavirus.  Past Medical History:  Diagnosis Date  . AICD (automatic cardioverter/defibrillator) present EP cardiologist--- dr Caryl Comes    placement 05-07-2014 , ef 25%,  NICM/  (02-11-2019 last echo 08-31-2018 ef 60-65%)  . Arthritis    "in about all my joints; for sure in my back"  . Bladder cancer [facility]) urologist-  dr Junious Silk   dx 1995--  recurrent bladder cancer 2015 , s/p TURBT's and chemo instillation's ;   04/ 2019  s/p TURBT  . Chronic hyponatremia   . Chronic low back pain with bilateral sciatica    s/p  spinal cord stimulator @ [facility]  10-09-2018  . Chronic systolic (congestive) heart failure [facility])     cardiologist-  dr Rogue Jury  . COPD with emphysema ([place])    (02-11-2019  per pt has never been on oxygen)  . Coronary artery disease cardiologist-  dr Kathlyn [place]   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% per cardiac cath 03-03-2014  . DDD (degenerative disc disease), thoracolumbar   . Frequent urination   . Full dentures   . Gait instability    due to chronic low back pain, uses roller walker  . GERD (gastroesophageal reflux disease)   . History of iron deficiency anemia 11/2013   resolved w/ IV Iron  (02-11-2019  per pt has not had any issues since 2015)  . History of stomach ulcers 11/2013  . Hypertension   . LBBB (left bundle branch block)   . NICM (nonischemic cardiomyopathy) ([place]) last echo 12-31-2015 ef 55-60%   dx 09/ 2015 per echo 20%;  myoview 09/ 2015 ef 28%;  per cardiac cath 12/ 2015 ef 30%;    . Nocturia more than twice per night   . S/P insertion of spinal cord stimulator followed by [facility]---- Dr Pecola Leisure (notes in care everywhere)   10-09-2018  @[facility] --- thoracic spinal cord stimular/ generator  (device from [facility])---- per pt has a control  . Scoliosis     Patient Active Problem List   Diagnosis Date Noted  . Lumbar facet arthropathy 02/07/2019  . Osteoporosis 08/28/2018  . Educated about COVID-19 virus infection 07/21/2018  . PAD (peripheral artery disease) ([place]) 07/13/2018  . Unintentional  weight loss 06/09/2018  . Chronic pain syndrome 02/06/2018  . Lumbar radiculopathy 02/06/2018  . Lumbar spondylosis 07/11/2017  . SI joint arthritis 07/11/2017  . Lumbar degenerative disc disease 07/11/2017  . Hyperlipidemia 03/26/2017  . S/P placement of cardiac pacemaker 03/26/2017  . Coronary artery disease 03/26/2017  . 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 Hypertension 09/06/2016  . Spinal stenosis, lumbar region, 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/18/2016  . Other idiopathic scoliosis, lumbar region 08/18/2016  . NSAID induced gastritis 05/26/2016  . Chronic bilateral  low back pain 05/15/2016  . Spinal stenosis in cervical region 01/19/2014  . Constipation 12/29/2013  . Spinal stenosis of thoracolumbar region 12/27/2013  . COPD (chronic obstructive pulmonary disease) ([place]) 12/27/2013  . History of gastric ulcer 11/28/2013  . Congestive dilated cardiomyopathy ([place]) 11/25/2013  . Bladder cancer ([place]) 11/24/2013  . LBBB (left bundle branch block) 11/24/2013  . Atrial ectopy 11/24/2013  . Hyponatremia 11/22/2013    Past Surgical History:  Procedure Laterality Date  . BI-VENTRICULAR IMPLANTABLE CARDIOVERTER DEFIBRILLATOR N/A 05/07/2014   Procedure: BI-VENTRICULAR IMPLANTABLE CARDIOVERTER DEFIBRILLATOR  (CRT-D);  Surgeon: Deboraha Sprang, MD;  Location: [facility] CATH LAB;  Service: Cardiovascular;  Laterality: N/A;  . CARDIAC CATHETERIZATION  03-03-2014  dr Kathlyn Sacramento   [facility]   pLAD 20%, pRCA 20%, dRCA 50%, RPLS 50%;  ef 30%, mild elevated LVEDP, mild gradient across aortic valve LVOT  . CARDIOVASCULAR STRESS TEST  11/25/2013   High risk nuclear study w/ large high severity inferior wall perfusion defect on stress and rest images, large mild severity anteroseptal wall perfusion defect on stress and rest images, No inducible ischemia/ global moderate hypokinesis, ef 28%  . CATARACT EXTRACTION W/ INTRAOCULAR LENS  IMPLANT, BILATERAL Bilateral right 12-2013 / left  02-2014  . CYSTOSCOPY W/ RETROGRADES Bilateral 01/06/2015   Procedure: CYSTOSCOPY WITH  BLADDER BIOPSY BILATERAL RETROGRADE PYELOGRAM,INSTILLATION OF MITOMYCIN C;  Surgeon: [name], MD;  Location: [facility] ORS;  Service: Urology;  Laterality: Bilateral;  . CYSTOSCOPY WITH BIOPSY N/A 10/06/2015   Procedure: CYSTO WITH BLADDER BIOPSY, FULGERATION, CHEMO IRRIGATION EPIRUBICIN IN PACU;  Surgeon: [name], MD;  Location: [facility]S;  Service: Urology;  Laterality: N/A;  . CYSTOSCOPY WITH BIOPSY N/A 02/12/2019   Procedure: CYSTOSCOPY WITH BIOPSY/ FULGURATION/ INSTILLATION OF GEMCITABINE, bilateral  retrograde turbt greater 5cm;  Surgeon: [name], MD;  Location: [facility];  Service: Urology;  Laterality: N/A;  . CYSTOSCOPY WITH FULGERATION N/A 06/30/2017   Procedure: Marland Kitchen CYSTOSCOPY WITH [place] FULGERATION/ BLADDER BIOPSY/ INSTILLATION OF EPIRUBICIN;  Surgeon: [name], MD;  Location: [facility];  Service: Urology;  Laterality: N/A;  . CYSTOSCOPY WITH RETROGRADE PYELOGRAM, URETEROSCOPY AND STENT PLACEMENT Bilateral 04/18/2014   Procedure: CYSTOSCOPY WITH RETROGRADE PYELOGRAM;  Surgeon: [name], MD;  Location: [facility];  Service: Urology;  Laterality: Bilateral;  . ESOPHAGOGASTRODUODENOSCOPY N/A 11/28/2013   Procedure: ESOPHAGOGASTRODUODENOSCOPY (EGD);  Surgeon: Arta Silence, MD;  Location: [facility] ENDOSCOPY;  Service: Endoscopy;  Laterality: N/A;  . LUMBAR [place]  1980's   "ruptured disc"  . 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 TRANSTHORACIC ECHOCARDIOGRAM  12/31/2015   dr Caryl Comes   ef [id number], grade 1 diastolic dysfunction/ mild MR/ septal motion showed abnormal function and dyssynergy  . TRANSURETHRAL RESECTION OF BLADDER  1995  . TRANSURETHRAL RESECTION OF BLADDER TUMOR N/A 12/13/2013   Procedure: TRANSURETHRAL RESECTION OF BLADDER TUMOR (TURBT);  Surgeon: [name],  MD;  Location: [facility];  Service: Urology;  Laterality: N/A;  . TRANSURETHRAL RESECTION OF BLADDER TUMOR N/A 01/17/2014   Procedure: TRANSURETHRAL RESECTION OF BLADDER TUMOR (TURBT);  Surgeon: [name], MD;  Location: [facility];  Service: Urology;  Laterality: N/A;  . TRANSURETHRAL RESECTION OF BLADDER TUMOR N/A 04/18/2014   Procedure: TRANSURETHRAL RESECTION OF BLADDER TUMOR (TURBT), CYSTOGRAM;  Surgeon: [name], MD;  Location: [facility];  Service: Urology;  Laterality: N/A;    Prior to Admission medications   Medication Sig Start Date End  Date Taking? Authorizing Provider  acetaminophen (TYLENOL) 500 MG tablet Take 500 mg by mouth every 8 (eight) hours as needed.    [provider]  amitriptyline (ELAVIL) 10 MG tablet Take 1 tablet (10 mg total) by mouth at bedtime. 03/26/19   [name], MD  amLODipine (NORVASC) 2.5 MG tablet Take 1 tablet (2.5 mg total) by mouth daily. 03/26/19   Deboraha Sprang, MD  gabapentin (NEURONTIN) 300 MG capsule Three times daily as needed for back pain 03/28/19   [name], MD  losartan (COZAAR) 100 MG tablet Take 1 tablet (100 mg total) by mouth at bedtime. 07/18/18   [name], MD  Meth-Hyo-M Barnett Hatter Phos-Ph Sal (URIBEL) 118 MG CAPS TK 1 C PO Q 6 H PRN 02/19/19   [provider]  methylPREDNISolone (MEDROL DOSEPAK) 4 MG TBPK tablet Take as directed on packaging 06/06/19   Duffy Bruce, MD  metoprolol succinate (TOPROL-XL) 25 MG 24 hr tablet Take 1 tablet (25 mg total) by mouth daily. 03/26/19   Deboraha Sprang, MD  oxyCODONE-acetaminophen (PERCOCET) 10-325 MG tablet Take 1 tablet by mouth every 6 (six) hours as needed for pain. Must last 30 days. 05/25/19 06/24/19  [name], MD  phenazopyridine (PYRIDIUM) 200 MG tablet Take 200 mg by mouth 3 (three) times daily as needed. 05/28/19   [name], MD  polyethylene glycol (MIRALAX / Floria Raveling) packet Take 17 g by mouth daily as needed for mild constipation.     [provider]  promethazine (PHENERGAN) 12.5 MG tablet Take 1 tablet (12.5 mg total) by mouth every 8 (eight) hours as needed for nausea or vomiting. 04/12/19   [name], MD  rosuvastatin (CRESTOR) 5 MG tablet Take 5 mg by mouth daily.     [provider]  solifenacin (VESICARE) 5 MG tablet Take 5 mg by mouth daily.    [provider]  Vitamin D, Ergocalciferol, (DRISDOL) 50000 units CAPS capsule TK ONE C PO Q WEEK FOR 8 WEEKS 11/16/17   [provider]    No Known Allergies  Family History  Problem Relation Age of Onset  .  Cancer Mother   . Cancer Brother     Social History Social History   Tobacco Use  . Smoking status: Current Every Day Smoker    Packs/day: 0.50    Years: 56.00    Pack years: 28.00    Types: Cigarettes  . Smokeless tobacco: Never Used  . Tobacco comment: 02-11-2019  per pt currently smokes 10 cig's per day , started smoking age 38  Substance Use Topics  . Alcohol use: No  . Drug use: No    Review of Systems Constitutional: Negative for fever.  Positive for generalized fatigue/weakness. Cardiovascular: Negative for chest pain. Respiratory: Negative for shortness of breath. Gastrointestinal: Negative for abdominal pain, vomiting  Genitourinary: Negative for urinary compaints Musculoskeletal: Negative for musculoskeletal complaints Skin: Negative for skin complaints  Neurological: Negative  for headache All other ROS negative  ____________________________________________   PHYSICAL EXAM:  VITAL SIGNS: ED Triage Vitals  Enc Vitals Group     BP 06/17/19 1042 132/86     Pulse Rate 06/17/19 1042 79     Resp 06/17/19 1042 16     Temp 06/17/19 1042 97.7 F (36.5 C)     Temp Source 06/17/19 1042 Oral     SpO2 06/17/19 1042 98 %     Weight 06/17/19 1043 120 lb (54.4 kg)     Height 06/17/19 1043 5\' 8"  (1.727 m)     Head Circumference --      Peak Flow --      Pain Score 06/17/19 1042 5     Pain Loc --      Pain Edu? --      Excl. in [place]? --    Constitutional: Alert and oriented. Well appearing and in no distress. Eyes: Normal exam ENT      Head: Normocephalic and atraumatic.      Mouth/Throat: Mucous membranes are moist. Cardiovascular: Normal rate, regular rhythm. Respiratory: Normal respiratory effort without tachypnea nor retractions. Breath sounds are clear Gastrointestinal: Soft and nontender. No distention. Musculoskeletal: Nontender with normal range of motion in all extremities.  Neurologic:  Normal speech and language. No gross focal neurologic deficits   Skin:  Skin is warm, dry and intact.  Psychiatric: Mood and affect are normal.   ____________________________________________    EKG  EKG viewed and interpreted by myself shows a ventricular paced rhythm at 79 bpm with a widened QRS nonspecific ST changes.  ____________________________________________    RADIOLOGY  IMPRESSION:  1. Unremarkable bowel gas pattern.   ____________________________________________   INITIAL IMPRESSION / ASSESSMENT AND PLAN / ED COURSE  Pertinent labs & imaging results that were available during my care of the patient were reviewed by me and considered in my medical decision making (see chart for details).   Patient presents to the emergency department for generalized fatigue/weakness.  Patient does have a history of bladder cancer, her last chemotherapy was 2.5 weeks ago.  Patient denies any vomiting or diarrhea denies dysuria.  No fever no cough or shortness of breath.  Overall the patient appears well does appear somewhat fatigued.  We will check labs, IV hydrate, obtain a urine sample.  I have also ordered an abdominal x-ray as a precaution even though the patient has benign abdominal exam, and a rapid Covid test although patient denies fever cough or shortness of breath.  Labs are largely nonrevealing.  Urine sample shows some white cells but no bacteria.  A urine culture has been sent.  Troponin has been added on, patient receiving IV fluids, Covid and x-ray pending.  KATHRYN SCHALLERT was evaluated in Emergency Department on 06/17/2019 for the symptoms described in the history of present illness. She was evaluated in the context of the global COVID-19 pandemic, which necessitated consideration that the patient might be at risk for infection with the SARS-CoV-2 virus that causes COVID-19. Institutional protocols and algorithms that pertain to the evaluation of patients at risk for COVID-19 are in a state of rapid change based on information released by  regulatory bodies including the CDC and federal and state organizations. These policies and algorithms were followed during the patient's care in the ED.  ____________________________________________   FINAL CLINICAL IMPRESSION(S) / ED DIAGNOSES  Generalized fatigue/weakness   Harvest Dark, MD 06/18/19 1510

## 2019-06-17 NOTE — ED Provider Notes (Signed)
COVID, troponin and x-ray without concerning findings. Patient stated she did feel better after IVFs and was able to get up and use the restroom. Discussed importance of follow up with PCP with patient and family. Discussed return precautions.    Nance Pear, MD 06/17/19 [phone]

## 2019-06-17 NOTE — Telephone Encounter (Signed)
Pt daughter called there has been a sudden change as of yesterday  Pt is unable to get out of bed. Pt is eating and drinking but she is very nauseated and feels like she is shaking inside. Bp is normal. No headache or SOB   Transferred call to [facility]

## 2019-06-17 NOTE — Telephone Encounter (Signed)
Left message for patient to return call back. Patient is currently in ED. Per chart.

## 2019-06-17 NOTE — ED Notes (Signed)
Pt signed discharge instructions and papers sent to chart.

## 2019-06-17 NOTE — Telephone Encounter (Signed)
Spoken to the daughter wendy about patient, patient has not been able to get out of bed or do anything, patient stated she feels "bed ridden". Patient is feeling weak, loss of appetite and unable to complete daily tasks x 2days. Irregular bowel movements x 1 day. Patient has not fallen nor having BP issues like before that sent patient to ED [id number]. I instructed patients daughter to take patient to ED to be evaluated which she and patient stated they will comply. Patients daughter also would like someone to come in and start helping with ptient in her daily activities like a home health nurse. Both would like referral completed. Please advise.

## 2019-06-17 NOTE — ED Triage Notes (Signed)
Pt is here with c/o generalized weakness with loss of appetite and feeling like she is going to pass out,. Pt was getting chemo for bladder cancer, last treatment was 2.5 weeks ago.

## 2019-06-17 NOTE — Discharge Instructions (Addendum)
Please seek medical attention for any high fevers, chest pain, shortness of breath, change in behavior, persistent vomiting, bloody stool or any other new or concerning symptoms.  

## 2019-06-17 NOTE — Telephone Encounter (Signed)
Please advise patient of the below comments,  And please FYI:  Home Health cannot be ordered for patinets without an office visit.    Home health does not provide aides,  Only skilled nursing,  PT  And OT

## 2019-06-18 ENCOUNTER — Telehealth: Payer: Self-pay

## 2019-06-18 ENCOUNTER — Telehealth: Payer: Self-pay | Admitting: Internal Medicine

## 2019-06-18 ENCOUNTER — Encounter: Payer: Self-pay | Admitting: Student in an Organized Health Care Education/Training Program

## 2019-06-18 NOTE — Telephone Encounter (Signed)
Patient is calling with her BP readings: 3/24- sitting 172/96 HR 70, standing 121/82 HR 76 3/25- sitting 182/88 HR 71, standing 142/100 HR 79

## 2019-06-18 NOTE — Telephone Encounter (Signed)
To Dr. Caryl Comes to review. See 06/11/19 phone note.  Patient was back in the ER yesterday with complaints of weakness.

## 2019-06-18 NOTE — Telephone Encounter (Signed)
LM for patient to call office

## 2019-06-18 NOTE — Telephone Encounter (Signed)
I spoke with the patient. She is aware Dr. Caryl Comes has reviewed her orthostatic BP readings that she called in today. I inquired from the patient if these readings were taken, sitting for 10 minutes, then immediately upon standing again.  Per the patient, she thinks she stood for 5 minutes prior to taking the standing reading. I have advised her she has had a significant drop in her BP with positional changes. She denies dizziness/ lightheadedness with these readings. However, she was in the ER yesterday with extreme weakness. She states she has not felt good for 2 weeks, but she relates this to a chemo bladder treatment she had about that time.   She was significantly weak yesterday, and at the urging of her PCP, her daughter took her to the ER. She states she doesn't feel like her issues are cardiac related. I advised the patient that with her significant BP drop, that Dr. Caryl Comes would like to do an e-visit with her to discuss treatment options.  The patient is agreeable with this. I have scheduled her for Thursday 06/20/19 at 1:45 pm with Dr. Caryl Comes for a video visit. An updated consent was obtained in a separate encounter. I have asked the patient to please obtain a BP (HR) reading the morning of her visit. She will try to obtain another orthostatic reading- sitting for 10 minutes, obtain a sitting reading, then once she stands and gets her balance, she will obtain another reading almost immediately.  The patient is agreeable with the above recommendations and voices understanding. Her son was listening to the call as well today.

## 2019-06-18 NOTE — Telephone Encounter (Signed)
  Patient Consent for Virtual Visit         [employer] has provided verbal consent on 06/18/2019 for a virtual visit (video or telephone).   CONSENT FOR VIRTUAL VISIT FOR:  [name]  By participating in this virtual visit I agree to the following:  I hereby voluntarily request, consent and authorize [facility] and its employed or contracted physicians, physician assistants, nurse practitioners or other licensed health care professionals (the Practitioner), to provide me with telemedicine health care services (the "Services") as deemed necessary by the treating Practitioner. I acknowledge and consent to receive the Services by the Practitioner via telemedicine. I understand that the telemedicine visit will involve communicating with the Practitioner through live audiovisual communication technology and the disclosure of certain medical information by electronic transmission. I acknowledge that I have been given the opportunity to request an in-person assessment or other available alternative prior to the telemedicine visit and am voluntarily participating in the telemedicine visit.  I understand that I have the right to withhold or withdraw my consent to the use of telemedicine in the course of my care at any time, without affecting my right to future care or treatment, and that the Practitioner or I may terminate the telemedicine visit at any time. I understand that I have the right to inspect all information obtained and/or recorded in the course of the telemedicine visit and may receive copies of available information for a reasonable fee.  I understand that some of the potential risks of receiving the Services via telemedicine include:  Marland Kitchen Delay or interruption in medical evaluation due to technological equipment failure or disruption; . Information transmitted may not be sufficient (e.g. poor resolution of images) to allow for appropriate medical decision making by the Practitioner;  and/or  . In rare instances, security protocols could fail, causing a breach of personal health information.  Furthermore, I acknowledge that it is my responsibility to provide information about my medical history, conditions and care that is complete and accurate to the best of my ability. I acknowledge that Practitioner's advice, recommendations, and/or decision may be based on factors not within their control, such as incomplete or inaccurate data provided by me or distortions of diagnostic images or specimens that may result from electronic transmissions. I understand that the practice of medicine is not an exact science and that Practitioner makes no warranties or guarantees regarding treatment outcomes. I acknowledge that a copy of this consent can be made available to me via my patient portal ([place]), or I can request a printed copy by calling the office of [facility].    I understand that my insurance will be billed for this visit.   I have read or had this consent read to me. . I understand the contents of this consent, which adequately explains the benefits and risks of the Services being provided via telemedicine.  . I have been provided ample opportunity to ask questions regarding this consent and the Services and have had my questions answered to my satisfaction. . I give my informed consent for the services to be provided through the use of telemedicine in my medical care

## 2019-06-19 ENCOUNTER — Ambulatory Visit
Payer: Medicare Other | Attending: Student in an Organized Health Care Education/Training Program | Admitting: Student in an Organized Health Care Education/Training Program

## 2019-06-19 ENCOUNTER — Encounter: Payer: Self-pay | Admitting: Student in an Organized Health Care Education/Training Program

## 2019-06-19 ENCOUNTER — Telehealth: Payer: Self-pay | Admitting: *Deleted

## 2019-06-19 ENCOUNTER — Telehealth (INDEPENDENT_AMBULATORY_CARE_PROVIDER_SITE_OTHER): Payer: Medicare Other | Admitting: Internal Medicine

## 2019-06-19 ENCOUNTER — Other Ambulatory Visit: Payer: Self-pay

## 2019-06-19 ENCOUNTER — Encounter: Payer: Self-pay | Admitting: Internal Medicine

## 2019-06-19 VITALS — Ht 68.0 in | Wt 120.0 lb

## 2019-06-19 DIAGNOSIS — E871 Hypo-osmolality and hyponatremia: Secondary | ICD-10-CM

## 2019-06-19 DIAGNOSIS — M5136 Other intervertebral disc degeneration, lumbar region: Secondary | ICD-10-CM

## 2019-06-19 DIAGNOSIS — C678 Malignant neoplasm of overlapping sites of bladder: Secondary | ICD-10-CM | POA: Diagnosis not present

## 2019-06-19 DIAGNOSIS — R531 Weakness: Secondary | ICD-10-CM

## 2019-06-19 DIAGNOSIS — M4726 Other spondylosis with radiculopathy, lumbar region: Secondary | ICD-10-CM | POA: Diagnosis not present

## 2019-06-19 DIAGNOSIS — I951 Orthostatic hypotension: Secondary | ICD-10-CM | POA: Diagnosis not present

## 2019-06-19 DIAGNOSIS — Z95810 Presence of automatic (implantable) cardiac defibrillator: Secondary | ICD-10-CM | POA: Insufficient documentation

## 2019-06-19 DIAGNOSIS — M48062 Spinal stenosis, lumbar region with neurogenic claudication: Secondary | ICD-10-CM | POA: Diagnosis not present

## 2019-06-19 DIAGNOSIS — G894 Chronic pain syndrome: Secondary | ICD-10-CM | POA: Diagnosis not present

## 2019-06-19 DIAGNOSIS — M51369 Other intervertebral disc degeneration, lumbar region without mention of lumbar back pain or lower extremity pain: Secondary | ICD-10-CM

## 2019-06-19 DIAGNOSIS — M47816 Spondylosis without myelopathy or radiculopathy, lumbar region: Secondary | ICD-10-CM

## 2019-06-19 DIAGNOSIS — M5416 Radiculopathy, lumbar region: Secondary | ICD-10-CM

## 2019-06-19 MED ORDER — OXYCODONE-ACETAMINOPHEN 10-325 MG PO TABS: 1.0000 | ORAL_TABLET | Freq: Two times a day (BID) | ORAL | 0 refills | Status: DC | PRN

## 2019-06-19 MED ORDER — XTAMPZA ER 18 MG PO C12A: 1.0000 | EXTENDED_RELEASE_CAPSULE | Freq: Two times a day (BID) | ORAL | 0 refills | Status: DC

## 2019-06-19 MED ORDER — PROMETHAZINE HCL 12.5 MG PO TABS: 12.5000 mg | ORAL_TABLET | Freq: Three times a day (TID) | ORAL | 2 refills | Status: DC | PRN

## 2019-06-19 NOTE — Progress Notes (Signed)
Patient: [name]  Service Category: E/M  Provider: [name], MD  DOB: Jan 12, 1941  DOS: 06/19/2019  Location: Office  MRN: [id number]  Setting: Ambulatory outpatient  Referring Provider: [name], MD  Type: Established Patient  Specialty: Interventional Pain Management  PCP: [name], MD  Location: Home  Delivery: TeleHealth     Virtual Encounter - Pain Management PROVIDER NOTE: Information contained herein reflects review and annotations entered in association with encounter. Interpretation of such information and data should be left to medically-trained personnel. Information provided to patient can be located elsewhere in the medical record under "Patient Instructions". Document created using STT-dictation technology, any transcriptional errors that may result from process are unintentional.    Contact & Pharmacy Preferred: [phone] Home: [phone] (home) Mobile: There is no such number on file (mobile). E-mail: [email or web address] [id number] [name], [address] [place] [address] Phone: [phone] Fax: [phone]   Pre-screening  [name] offered "in-person" vs "virtual" encounter. She indicated preferring virtual for this encounter.   Reason COVID-19*  Social distancing based on CDC and AMA recommendations.   I contacted [name] on 06/19/2019 via telephone.      I clearly identified myself as [name], MD. I verified that I was speaking with the correct person using two identifiers (Name: [name], and date of birth: [birth year]).  This visit was completed via telephone due to the restrictions of the COVID-19 pandemic. All issues as above were discussed and addressed but no physical exam was performed. If it was felt that the patient should be evaluated in the office, they were directed there. The patient verbally consented to this visit. Patient  was unable to complete an audio/visual visit due to Technical difficulties and/or Lack of internet. Due to the catastrophic nature of the COVID-19 pandemic, this visit was done through audio contact only.  Location of the patient: home address (see Epic for details)  Location of the provider: office Consent I sought verbal advanced consent from [name] for virtual visit interactions. I informed [name] of possible security and privacy concerns, risks, and limitations associated with providing "not-in-person" medical evaluation and management services. I also informed [name] of the availability of "in-person" appointments. Finally, I informed her that there would be a charge for the virtual visit and that she could be  personally, fully or partially, financially responsible for it. Ms. Maclaren expressed understanding and agreed to proceed.   Historic Elements   [name] is a 79 y.o. year old, female patient evaluated today after her last contact with our practice on 06/18/2019. [name]  has a past medical history of AICD (automatic cardioverter/defibrillator) present ([place] cardiologist--- dr Caryl Comes ), Arthritis, Bladder cancer [facility]) (urologist-  dr Junious Silk), Chronic hyponatremia, Chronic low back pain with bilateral sciatica, Chronic systolic (congestive) heart failure ([place]), COPD with emphysema ([place]), Coronary artery disease (cardiologist-  dr Kathlyn Sacramento), DDD (degenerative disc disease), thoracolumbar, Frequent urination, Full dentures, Gait instability, GERD (gastroesophageal reflux disease), History of iron deficiency anemia (11/2013), History of stomach ulcers (11/2013), Hypertension, LBBB (left bundle branch block), NICM (nonischemic cardiomyopathy) ([place]) (last echo 12-31-2015 ef 55-60%), Nocturia more than twice per night, S/P insertion of spinal cord stimulator (followed by [facility]---- Dr Pecola Leisure (notes in care everywhere)), and Scoliosis. She also  has a past surgical  history that includes  Esophagogastroduodenoscopy (N/A, 11/28/2013); Transurethral resection of bladder tumor (N/A, 12/13/2013); Transurethral resection of bladder tumor (N/A, 01/17/2014); Cataract extraction w/ intraocular lens  implant, bilateral (Bilateral, right 12-2013 / left  02-2014); Cystoscopy with retrograde pyelogram, ureteroscopy and stent placement (Bilateral, 04/18/2014); Transurethral resection of bladder tumor (N/A, 04/18/2014); Lumbar disc surgery (1980's); bi-ventricular implantable cardioverter defibrillator (N/A, 05/07/2014); Cystoscopy w/ retrogrades (Bilateral, 01/06/2015); Cystoscopy with biopsy (N/A, 10/06/2015); Cardiac catheterization (03-03-2014  dr Kathlyn [place]   [facility]); Transurethral resection of bladder (1995); Cardiovascular stress test (11/25/2013); transthoracic echocardiogram (12/31/2015   dr Caryl Comes); Cystoscopy with fulgeration (N/A, 06/30/2017); Spinal cord stimulator implant (10-09-2018   @[facility] ); and Cystoscopy with biopsy (N/A, 02/12/2019). [name] has a current medication list which includes the following prescription(s): acetaminophen, amitriptyline, amlodipine, gabapentin, losartan, uribel, metoprolol succinate, phenazopyridine, polyethylene glycol, rosuvastatin, solifenacin, vitamin d (ergocalciferol), xtampza er, oxycodone-acetaminophen, and promethazine, and the following Facility-Administered Medications: epirubicin (ELLENCE) 50 mg in sodium chloride 0.9 % bladder instillation and gemcitabine. She  reports that she has been smoking cigarettes. She has a 28.00 pack-year smoking history. She has never used smokeless tobacco. She reports that she does not drink alcohol or use drugs. [name] has No Known Allergies.   HPI  Today, she is being contacted for medication management.  Patient is being contacted today for medication management.  She states that she is having a hard time managing her pain.  She is suffering she states.  Her son accompanies her on her virtual visit  today.  He also reinforces that her mother is struggling.  Although she does find some pain relief with oxycodone, it is not long-lasting and provides maybe 2 to 9 hours of analgesic benefit before the patient has return of her pain.  She states that she is usually in 9 out of 10 pain most of the day and has to lie down quite often.  She is not finding much benefit with thoracolumbar spinal cord stimulation.  This was done at [facility].  We discussed alternatives which could include transitioning to an extended release oxycodone to provide longer duration of analgesic benefit.  Risks and benefits of this were reviewed and patient would like to give this a try.  Please see below.  Pharmacotherapy Assessment  Analgesic: 05/24/2019  1   03/26/2019  Oxycodone-Acetaminophen 10-325  120.00  30 Bi Lat   [id number]   Wal ([id number])   0  60.00 MME  Comm Ins   [place]    Monitoring: [place] PMP: PDMP reviewed during this encounter.       Pharmacotherapy: No side-effects or adverse reactions reported. Compliance: No problems identified. Effectiveness: Clinically acceptable. Plan: Refer to "POC".  UDS:  Summary  Date Value Ref Range Status  02/06/2018 FINAL  Final    Comment:    ==================================================================== TOXASSURE SELECT 13 (MW) ==================================================================== Test                             Result       Flag       Units Drug Present   Hydrocodone                    3616                    ng/mg creat   Hydromorphone                  1604  ng/mg creat   Dihydrocodeine                 328                     ng/mg creat   Norhydrocodone                 5344                    ng/mg creat    Sources of hydrocodone include scheduled prescription    medications. Hydromorphone, dihydrocodeine and norhydrocodone are    expected metabolites of hydrocodone. Hydromorphone and    dihydrocodeine are also available as scheduled  prescription    medications. ==================================================================== Test                      Result    Flag   Units      Ref Range   Creatinine              25               mg/dL      >=20 ==================================================================== Declared Medications:  Medication list was not provided. ==================================================================== For clinical consultation, please call [phone]. ====================================================================    Laboratory Chemistry Profile   Renal Lab Results  Component Value Date   BUN 9 06/17/2019   CREATININE 0.60 06/17/2019   BCR 18 04/28/2014   GFR 108.68 01/11/2019   GFRAA >60 06/17/2019   GFRNONAA >60 06/17/2019     Hepatic Lab Results  Component Value Date   AST 43 (H) 06/03/2019   ALT 41 06/03/2019   ALBUMIN 4.3 06/03/2019   ALKPHOS 77 06/03/2019   LIPASE 30 11/23/2013     Electrolytes Lab Results  Component Value Date   NA 135 06/17/2019   K 3.6 06/17/2019   CL 98 06/17/2019   CALCIUM 9.2 06/17/2019   MG 2.3 06/03/2019     Bone Lab Results  Component Value Date   VD25OH 36.17 01/11/2019     Inflammation (CRP: Acute Phase) (ESR: Chronic Phase) No results found for: CRP, ESRSEDRATE, LATICACIDVEN     Note: Above Lab results reviewed.  Imaging  DG Abd Portable 2 Views CLINICAL DATA:  Rectal pain for 1 month, constipation, history of bladder cancer  EXAM: PORTABLE ABDOMEN - 2 VIEW  COMPARISON:  05/24/2016  FINDINGS: 2 supine frontal views of the abdomen and pelvis are obtained. Multi lead AICD is identified. Spinal stimulator generator overlies left mid abdomen, leads over midthoracic central canal. Bowel gas pattern is unremarkable. Minimal retained stool. No obstruction or ileus. No masses or abnormal calcifications.  Marked right convex scoliosis with multilevel spondylosis and facet hypertrophy. Bilateral hip  osteoarthritis.  IMPRESSION: 1. Unremarkable bowel gas pattern.  Electronically Signed   By: Randa Ngo M.D.   On: 06/17/2019 16:31  Assessment  The primary encounter diagnosis was Chronic pain syndrome. Diagnoses of Lumbar radiculopathy, Lumbar degenerative disc disease, Spinal stenosis, lumbar region, with neurogenic claudication, and Lumbar facet arthropathy were also pertinent to this visit.  Plan of Care   Ms. SYNAI PRETTYMAN has a current medication list which includes the following long-term medication(s): amitriptyline, amlodipine, gabapentin, losartan, metoprolol succinate, and promethazine.  1.  Start Xtampza ER 18 mg twice daily.  This is to help manage her chronic baseline pain as this is not managed on her current regimen.  She is obtaining suboptimal pain relief with  oxycodone.  She has trialed hydrocodone and tramadol in the past which was not effective. 2.  Reduce immediate release oxycodone to 10 mg twice daily as needed for breakthrough pain. 3.  Continue multimodal analgesics including acetaminophen, amitriptyline, gabapentin as prescribed. 4.  Patient needs to come for UDS prior to next clinic visit, will have clinic staff call patient and inform 5.  Follow-up in 4 weeks.  Pharmacotherapy (Medications Ordered): Meds ordered this encounter  Medications  . oxyCODONE ER (XTAMPZA ER) 18 MG C12A    Sig: Take 1 capsule by mouth every 12 (twelve) hours.    Dispense:  60 capsule    Refill:  0  . oxyCODONE-acetaminophen (PERCOCET) 10-325 MG tablet    Sig: Take 1 tablet by mouth 2 (two) times daily as needed for pain. Must last 30 days.    Dispense:  60 tablet    Refill:  0    Chronic Pain. (STOP Act - Not applicable). Fill one day early if closed on scheduled refill date.   Orders:  No orders of the defined types were placed in this encounter.  Follow-up plan:   Return in about 4 weeks (around 07/17/2019) for Medication Management, virtual.    Recent Visits Date  Type Provider Dept  03/26/19 Office Visit [name], MD [facility]  Showing recent visits within past 90 days and meeting all other requirements   Today's Visits Date Type Provider Dept  06/19/19 Office Visit [name], MD [facility]  Showing today's visits and meeting all other requirements   Future Appointments No visits were found meeting these conditions.  Showing future appointments within next 90 days and meeting all other requirements   I discussed the assessment and treatment plan with the patient. The patient was provided an opportunity to ask questions and all were answered. The patient agreed with the plan and demonstrated an understanding of the instructions.  Patient advised to call back or seek an in-person evaluation if the symptoms or condition worsens.  Duration of encounter: 11mnutes.  Note by: BGillis Santa MD Date: 06/19/2019; Time: 2:10 PM

## 2019-06-19 NOTE — Progress Notes (Signed)
Virtual Visit via [place]  This visit type was conducted due to national recommendations for restrictions regarding the COVID-19 pandemic (e.g. social distancing).  This format is felt to be most appropriate for this patient at this time.  All issues noted in this document were discussed and addressed.  No physical exam was performed (except for noted visual exam findings with Video Visits).   I connected with@ on 06/19/19 at 11:00 AM EDT by a video enabled telemedicine application and verified that I am speaking with the correct person using two identifiers. Location patient: home Location provider: work or home office Persons participating in the virtual visit: patient, provider  I discussed the limitations, risks, security and privacy concerns of performing an evaluation and management service by telephone and the availability of in person appointments. I also discussed with the patient that there may be a patient responsible charge related to this service. The patient expressed understanding and agreed to proceed.  Reason for visit: subacute issues, 6 month follow up   HPI:  79 yr old female with chronic low back pain secondary to spinal stenosis, bladder cancer and ischemic cardiomyopathy lasted seen by me in Sept 2020.  [name]  Recent PMH::  Started treatment for noninvasive bladder ca with intravesicular gemcitabine on Jan 7.  Pretreatment status was very good: Was feeling fine,  Appetite normal prior to infusion .  The following week  multiple family members In home tested positive for  COVID 19 INFECTION. Patient tested negative on jan 13 .  Infusion cycle was reinitiated with weekly infusions starting on  Feb 11,  Feb 18,   Feb 25, march 4  . On march 8 she developed dysuria, incontinence, and rectal pain and told to contact her urologist for treatment.  On  March 11 she was infused cycle #5  , On march 15 she was given IV fluids for dehydration  .    Treated in ER  On March 18 for  bladder spasms,  Urinary frequency with small voids c/w Cystitis and orthostasis.  Given Vantin for cystitis, Medrol dose pack for hypotension  (?)  Patient contacted her cardiologist about her hypotension on March 23 and  asked to record her  BP's daily for 5 days and report readings to office Patient's oncologist advised her on march 23 to suspend gemcitabine treatments and follow up with urologist Patient's daughter contacted my office via mychart about an infection in her outer ear on March 25 and was advised to make an appointment She was treated in the ER on  March 29 for progressive weakness and orthostatic hypotension .  Acute MI,  COVID 19 RULED OUT  And she was given IV fluids with improved symptoms. Medications were not changed since her oral intake had been poor.    1)  She reports persistent mild to moderate rectal pain that has been present since she began chemotherapy for her bladder cancer,  It is is present most of the time  Not aggravated by stooling or constipation. No bleeding,  No hemorrhoids per [facility] exam on Saturday.  Given Anusol HC but has not tried it yet.   2) Bilateral leg weakness.  She is using a rolling walker with a seat and basket in the home and can manage her own medications , but needs assistance with shower and bathroom activities, cooking/preparing meals. Able to dress herself if clothes are laid out   3) She has been noting dizziness with standing .  Denies vertigo ,  just feels light headed  Home readings confirm orthostasis.  Still taking amlodipine, metoprolol and losartan   4) Chronic pain: managed with opioids by pain clinic .  She has had No significant change despite undergoing  spinal cord stimulator ar [facility] in 2020, ROS: See pertinent positives and negatives per HPI.  Past Medical History:  Diagnosis Date  . AICD (automatic cardioverter/defibrillator) present EP cardiologist--- dr Caryl Comes    placement 05-07-2014 , ef 25%,  NICM/  (02-11-2019 last  echo 08-31-2018 ef 60-65%)  . Arthritis    "in about all my joints; for sure in my back"  . Bladder cancer [facility]) urologist-  dr Junious Silk   dx 1995--  recurrent bladder cancer 2015 , s/p TURBT's and chemo instillation's ;   04/ 2019  s/p TURBT  . Chronic hyponatremia   . Chronic low back pain with bilateral sciatica    s/p  spinal cord stimulator @ [facility]  10-09-2018  . Chronic systolic (congestive) heart failure [facility])    cardiologist-  dr Rogue Jury  . COPD with emphysema ([place])    (02-11-2019  per pt has never been on oxygen)  . Coronary artery disease cardiologist-  dr Kathlyn [place]   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% per cardiac cath 03-03-2014  . DDD (degenerative disc disease), thoracolumbar   . Frequent urination   . Full dentures   . Gait instability    due to chronic low back pain, uses roller walker  . GERD (gastroesophageal reflux disease)   . History of iron deficiency anemia 11/2013   resolved w/ IV Iron  (02-11-2019  per pt has not had any issues since 2015)  . History of stomach ulcers 11/2013  . Hypertension   . LBBB (left bundle branch block)   . NICM (nonischemic cardiomyopathy) ([place]) last echo 12-31-2015 ef 55-60%   dx 09/ 2015 per echo 20%;  myoview 09/ 2015 ef 28%;  per cardiac cath 12/ 2015 ef 30%;    . Nocturia more than twice per night   . S/P insertion of spinal cord stimulator followed by [facility]---- Dr Pecola Leisure (notes in care everywhere)   10-09-2018  @[facility] --- thoracic spinal cord stimular/ generator  (device from [facility])---- per pt has a control  . Scoliosis     Past Surgical History:  Procedure Laterality Date  . BI-VENTRICULAR IMPLANTABLE CARDIOVERTER DEFIBRILLATOR N/A 05/07/2014   Procedure: BI-VENTRICULAR IMPLANTABLE CARDIOVERTER DEFIBRILLATOR  (CRT-D);  Surgeon: Deboraha Sprang, MD;  Location: [facility] CATH LAB;  Service: Cardiovascular;  Laterality: N/A;  . CARDIAC CATHETERIZATION  03-03-2014  dr Kathlyn [place]   [facility]   pLAD 20%, pRCA 20%, dRCA 50%, RPLS  50%;  ef 30%, mild elevated LVEDP, mild gradient across aortic valve LVOT  . CARDIOVASCULAR STRESS TEST  11/25/2013   High risk nuclear study w/ large high severity inferior wall perfusion defect on stress and rest images, large mild severity anteroseptal wall perfusion defect on stress and rest images, No inducible ischemia/ global moderate hypokinesis, ef 28%  . CATARACT EXTRACTION W/ INTRAOCULAR LENS  IMPLANT, BILATERAL Bilateral right 12-2013 / left  02-2014  . CYSTOSCOPY W/ RETROGRADES Bilateral 01/06/2015   Procedure: CYSTOSCOPY WITH  BLADDER BIOPSY BILATERAL RETROGRADE PYELOGRAM,INSTILLATION OF MITOMYCIN C;  Surgeon: [name], MD;  Location: [facility] ORS;  Service: Urology;  Laterality: Bilateral;  . CYSTOSCOPY WITH BIOPSY N/A 10/06/2015   Procedure: CYSTO WITH BLADDER BIOPSY, FULGERATION, CHEMO IRRIGATION EPIRUBICIN IN PACU;  Surgeon: [name], MD;  Location: [facility];  Service: Urology;  Laterality: N/A;  . CYSTOSCOPY WITH BIOPSY N/A 02/12/2019   Procedure: CYSTOSCOPY WITH BIOPSY/ FULGURATION/ INSTILLATION OF GEMCITABINE, bilateral retrograde turbt greater 5cm;  Surgeon: [name], MD;  [facility];  Service: Urology;  Laterality: N/A;  . CYSTOSCOPY WITH FULGERATION N/A 06/30/2017   Procedure: [name] CYSTOSCOPY WITH [place] FULGERATION/ BLADDER BIOPSY/ INSTILLATION OF EPIRUBICIN;  Surgeon: [name], MD;  Location[facility];  Service: Urology;  Laterality: N/A;  . CYSTOSCOPY WITH RETROGRADE PYELOGRAM, URETEROSCOPY AND STENT PLACEMENT Bilateral 04/18/2014   Procedure: CYSTOSCOPY WITH RETROGRADE PYELOGRAM;  Surgeon: [name], MD;  Location: [facility];  Service: Urology;  Laterality: Bilateral;  . ESOPHAGOGASTRODUODENOSCOPY N/A 11/28/2013   Procedure: ESOPHAGOGASTRODUODENOSCOPY (EGD);  Surgeon: Arta Silence, MD;  Location: [facility] ENDOSCOPY;  Service: Endoscopy;  Laterality: N/A;  . LUMBAR [place]  1980's   "ruptured  disc"  . 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 TRANSTHORACIC ECHOCARDIOGRAM  12/31/2015   dr Caryl Comes   ef [id number], grade 1 diastolic dysfunction/ mild MR/ septal motion showed abnormal function and dyssynergy  . TRANSURETHRAL RESECTION OF BLADDER  1995  . TRANSURETHRAL RESECTION OF BLADDER TUMOR N/A 12/13/2013   Procedure: TRANSURETHRAL RESECTION OF BLADDER TUMOR (TURBT);  Surgeon: [name], MD;  Location: [facility];  Service: Urology;  Laterality: N/A;  . TRANSURETHRAL RESECTION OF BLADDER TUMOR N/A 01/17/2014   Procedure: TRANSURETHRAL RESECTION OF BLADDER TUMOR (TURBT);  Surgeon: [name], MD;  Location: [facility];  Service: Urology;  Laterality: N/A;  . TRANSURETHRAL RESECTION OF BLADDER TUMOR N/A 04/18/2014   Procedure: TRANSURETHRAL RESECTION OF BLADDER TUMOR (TURBT), CYSTOGRAM;  Surgeon: [name], MD;  Location: [facility];  Service: Urology;  Laterality: N/A;    Family History  Problem Relation Age of Onset  . Cancer Mother   . Cancer Brother     SOCIAL HX:  reports that she has been smoking cigarettes. She has a 28.00 pack-year smoking history. She has never used smokeless tobacco. She reports that she does not drink alcohol or use drugs.   Current Outpatient Medications:  .  acetaminophen (TYLENOL) 500 MG tablet, Take 500 mg by mouth every 8 (eight) hours as needed., Disp: , Rfl:  .  amitriptyline (ELAVIL) 10 MG tablet, Take 1 tablet (10 mg total) by mouth at bedtime., Disp: 30 tablet, Rfl: 2 .  amLODipine (NORVASC) 2.5 MG tablet, Take 1 tablet (2.5 mg total) by mouth daily., Disp: 90 tablet, Rfl: 3 .  gabapentin (NEURONTIN) 300 MG capsule, Three times daily as needed for back pain, Disp: 270 capsule, Rfl: 1 .  losartan (COZAAR) 100 MG tablet, Take 1 tablet (100 mg total) by mouth at bedtime., Disp: 90 tablet, Rfl: 1 .  Meth-Hyo-M Bl-Na Phos-Ph Sal (URIBEL) 118 MG  CAPS, TK 1 C PO Q 6 H PRN, Disp: , Rfl:  .  metoprolol succinate (TOPROL-XL) 25 MG 24 hr tablet, Take 1 tablet (25 mg total) by mouth daily., Disp: 90 tablet, Rfl: 3 .  phenazopyridine (PYRIDIUM) 200 MG tablet, Take 200 mg by mouth 3 (three) times daily as needed., Disp: , Rfl:  .  polyethylene glycol (MIRALAX / GLYCOLAX) packet, Take 17 g by mouth daily as needed for mild constipation. , Disp: , Rfl:  .  promethazine (PHENERGAN) 12.5 MG tablet, Take 1 tablet (12.5 mg total) by mouth every 8 (eight) hours as needed for nausea or vomiting., Disp: 30  tablet, Rfl: 2 .  rosuvastatin (CRESTOR) 5 MG tablet, Take 5 mg by mouth daily. , Disp: , Rfl:  .  solifenacin (VESICARE) 5 MG tablet, Take 5 mg by mouth daily., Disp: , Rfl:  .  oxyCODONE ER (XTAMPZA ER) 18 MG C12A, Take 1 capsule by mouth every 12 (twelve) hours., Disp: 60 capsule, Rfl: 0 .  oxyCODONE-acetaminophen (PERCOCET) 10-325 MG tablet, Take 1 tablet by mouth 2 (two) times daily as needed for pain. Must last 30 days., Disp: 60 tablet, Rfl: 0 .  Vitamin D, Ergocalciferol, (DRISDOL) 50000 units CAPS capsule, TK ONE C PO Q WEEK FOR 8 WEEKS, Disp: , Rfl: 0 No current facility-administered medications for this visit.  Facility-Administered Medications Ordered in Other Visits:  .  epirubicin (ELLENCE) 50 mg in sodium chloride 0.9 % bladder instillation, 50 mg, Bladder Instillation, Once, [name], MD .  gemcitabine [facility]) chemo syringe for bladder instillation 2,000 mg, 2,000 mg, Bladder Instillation, Once, [name], MD  EXAMTonette Bihari per patient if applicable:  GENERAL: alert, oriented, appears well and in no acute distress  HEENT: atraumatic, conjunttiva clear, no obvious abnormalities on inspection of external nose and ears  NECK: normal movements of the head and neck  LUNGS: on inspection no signs of respiratory distress, breathing rate appears normal, no obvious gross SOB, gasping or wheezing  CV: no obvious  cyanosis  MS: moves all visible extremities without noticeable abnormality  PSYCH/NEURO: pleasant and cooperative, no obvious depression or anxiety, speech and thought processing grossly intact  ASSESSMENT AND PLAN:  Discussed the following assessment and plan:  Generalized weakness - Plan: Ambulatory referral to Home Health  Hyponatremia  Malignant neoplasm of overlapping sites of bladder (HCC)  Orthostatic hypotension  Hyponatremia Chronic, mild,  Aggravated by dehydration. encorarged to increase hydration with gatorade   Bladder cancer Noninvasive,  She completed 5 of 6 planned weekly infusions of gemcitabine (plus an initial one in January ) before stopping due to severe hypotension and weaknes.s  Orthostatic hypotension Stopping amlodipine.  Continue metoprolol and losartan for now and follow home BPs  Generalized weakness Chronic,  With spinal stenosis as the initial cause, now aggravated by dehydration chemotherapy.  Home Health RN and PT ordered . she cannot stay alone ; family is staying with her.     I discussed the assessment and treatment plan with the patient. The patient was provided an opportunity to ask questions and all were answered. The patient agreed with the plan and demonstrated an understanding of the instructions.   The patient was advised to call back or seek an in-person evaluation if the symptoms worsen or if the condition fails to improve as anticipated.   A total of 60 minutes was spent in non face to face time with patient more than half of which was spent in counseling patient on the above mentioned issues , reviewing and explaining recent labs and imaging studies done, and coordination of care.  [name], MD

## 2019-06-19 NOTE — Progress Notes (Signed)
Electrophysiology TeleHealth Note   Due to national recommendations of social distancing due to COVID 19, an audio/video telehealth visit is felt to be most appropriate for this patient at this time.  See MyChart message from today for the patient's consent to telehealth for [facility].   Date:  06/20/2019   ID:  [name], DOB 16-Dec-1940, MRN [id number]  Location: patient's home  Provider location: [address] [place]  Evaluation Performed: Follow-up visit  PCP:  [name], MD  Cardiologist:     Electrophysiologist:  SK   Chief Complaint:  Weakness   History of Present Illness:    [name] is a 79 y.o. female who presents via audio/video conferencing for a telehealth visit today.  Since last being seen in our clinic for weakness and domcumented orthostatic hypotension*  the patient reports still having weakness  She has a history of a nonischemic cardiomyopathy with ejection fraction 25% and left bundle branch block. She also had tracings in the hospital with the operating QRS in lead V1 suggesting alternating bundle branch block; however those times, the limb leads still demonstrated left bundle branch block  DATE TEST EF   9/15 Myoview 28%   1/16 Cath   % CA min disease  10/17 Echo   55-65 %   6/20 Echo  60-65%     Date Cr K Hgb  7/17 0.55 4.1   1/19 0.73 4.9   3/20 0.8 4.1 12.5  11/20 0.5 4.2 14.3      The patient denies symptoms of fevers, chills, cough, or new SOB worrisome for COVID 19.    Past Medical History:  Diagnosis Date  . AICD (automatic cardioverter/defibrillator) present EP cardiologist--- dr Caryl Comes    placement 05-07-2014 , ef 25%,  NICM/  (02-11-2019 last echo 08-31-2018 ef 60-65%)  . Arthritis    "in about all my joints; for sure in my back"  . Bladder cancer [facility]) urologist-  dr Junious Silk   dx 1995--  recurrent bladder cancer 2015 , s/p TURBT's and chemo instillation's ;   04/ 2019  s/p TURBT  . Chronic  hyponatremia   . Chronic low back pain with bilateral sciatica    s/p  spinal cord stimulator @ [facility]  10-09-2018  . Chronic systolic (congestive) heart failure [facility])    cardiologist-  dr Rogue Jury  . 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)    (02-11-2019  per pt has never been on oxygen)  . Coronary artery disease cardiologist-  dr Kathlyn [place]   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% per cardiac cath 03-03-2014  . DDD (degenerative disc disease), thoracolumbar   . Frequent urination   . Full dentures   . Gait instability    due to chronic low back pain, uses roller walker  . GERD (gastroesophageal reflux disease)   . History of iron deficiency anemia 11/2013   resolved w/ IV Iron  (02-11-2019  per pt has not had any issues since 2015)  . History of stomach ulcers 11/2013  . Hypertension   . LBBB (left bundle branch block)   . NICM (nonischemic cardiomyopathy) ([place]) last echo 12-31-2015 ef 55-60%   dx 09/ 2015 per echo 20%;  myoview 09/ 2015 ef 28%;  per cardiac cath 12/ 2015 ef 30%;    . Nocturia more than twice per night   . S/P insertion of spinal cord stimulator followed by [facility]---- Dr Pecola Leisure (notes in care everywhere)  10-09-2018  @[facility] --- thoracic spinal cord stimular/ generator  (device from [employer])---- per pt has a control  . Scoliosis     Past Surgical History:  Procedure Laterality Date  . BI-VENTRICULAR IMPLANTABLE CARDIOVERTER DEFIBRILLATOR N/A 05/07/2014   Procedure: BI-VENTRICULAR IMPLANTABLE CARDIOVERTER DEFIBRILLATOR  (CRT-D);  Surgeon: Deboraha Sprang, MD;  Location[facility] C[facility] LAB;  Service: Cardiovascular;  Laterality: N/A;  . CARDIAC CATHETERIZATION  03-03-2014  dr Kathlyn [place]   [facility]   pLAD 20%, pRCA 20%, dRCA 50%, RPLS 50%;  ef 30%, mild elevated LVEDP, mild gradient across aortic valve LVOT  . CARDIOVASCULAR STRESS TEST  11/25/2013   High risk nuclear study w/ large high severity inferior wall perfusion defect on stress and rest images, large mild severity anteroseptal  wall perfusion defect on stress and rest images, No inducible ischemia/ global moderate hypokinesis, ef 28%  . CATARACT EXTRACTION W/ INTRAOCULAR LENS  IMPLANT, BILATERAL Bilateral right 12-2013 / left  02-2014  . CYSTOSCOPY W/ RETROGRADES Bilateral 01/06/2015   Procedure: CYSTOSCOPY WITH  BLADDER BIOPSY BILATERAL RETROGRADE PYELOGRAM,INSTILLATION OF MITOMYCIN C;  Surgeon: [name], MD;  Location: [facility] ORS;  Service: Urology;  Laterality: Bilateral;  . CYSTOSCOPY WITH BIOPSY N/A 10/06/2015   Procedure: CYSTO WITH BLADDER BIOPSY, FULGERATION, CHEMO IRRIGATION EPIRUBICIN IN PACU;  Surgeon: [name], MD;  Location: [facility]S;  Service: Urology;  Laterality: N/A;  . CYSTOSCOPY WITH BIOPSY N/A 02/12/2019   Procedure: CYSTOSCOPY WITH BIOPSY/ FULGURATION/ INSTILLATION OF GEMCITABINE, bilateral retrograde turbt greater 5cm;  Surgeon: [name], MD;  Location: [facility];  Service: Urology;  Laterality: N/A;  . CYSTOSCOPY WITH FULGERATION N/A 06/30/2017   Procedure: Marland Kitchen CYSTOSCOPY WITH [place] FULGERATION/ BLADDER BIOPSY/ INSTILLATION OF EPIRUBICIN;  Surgeon: [name], MD;  Location: [facility];  Service: Urology;  Laterality: N/A;  . CYSTOSCOPY WITH RETROGRADE PYELOGRAM, URETEROSCOPY AND STENT PLACEMENT Bilateral 04/18/2014   Procedure: CYSTOSCOPY WITH RETROGRADE PYELOGRAM;  Surgeon: [name], MD;  Location: [facility];  Service: Urology;  Laterality: Bilateral;  . ESOPHAGOGASTRODUODENOSCOPY N/A 11/28/2013   Procedure: ESOPHAGOGASTRODUODENOSCOPY (EGD);  Surgeon: Arta Silence, MD;  Location: [facility] ENDOSCOPY;  Service: Endoscopy;  Laterality: N/A;  . LUMBAR [place]  1980's   "ruptured disc"  . 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 TRANSTHORACIC ECHOCARDIOGRAM  12/31/2015   dr Caryl Comes   ef [id number], grade 1 diastolic dysfunction/ mild MR/  septal motion showed abnormal function and dyssynergy  . TRANSURETHRAL RESECTION OF BLADDER  1995  . TRANSURETHRAL RESECTION OF BLADDER TUMOR N/A 12/13/2013   Procedure: TRANSURETHRAL RESECTION OF BLADDER TUMOR (TURBT);  Surgeon: [name], MD;  Location: [facility];  Service: Urology;  Laterality: N/A;  . TRANSURETHRAL RESECTION OF BLADDER TUMOR N/A 01/17/2014   Procedure: TRANSURETHRAL RESECTION OF BLADDER TUMOR (TURBT);  Surgeon: [name], MD;  Location: [facility];  Service: Urology;  Laterality: N/A;  . TRANSURETHRAL RESECTION OF BLADDER TUMOR N/A 04/18/2014   Procedure: TRANSURETHRAL RESECTION OF BLADDER TUMOR (TURBT), CYSTOGRAM;  Surgeon: [name], MD;  Location: [facility];  Service: Urology;  Laterality: N/A;    Current Outpatient Medications  Medication Sig Dispense Refill  . acetaminophen (TYLENOL) 500 MG tablet Take 500 mg by mouth every 8 (eight) hours as needed.    Marland Kitchen amitriptyline (ELAVIL) 10 MG tablet Take 1 tablet (10 mg total) by mouth at bedtime. 30 tablet 2  . amLODipine (NORVASC) 2.5 MG tablet  Take 1 tablet (2.5 mg total) by mouth daily. 90 tablet 3  . gabapentin (NEURONTIN) 300 MG capsule Three times daily as needed for back pain 270 capsule 1  . losartan (COZAAR) 100 MG tablet Take 1 tablet (100 mg total) by mouth at bedtime. 90 tablet 1  . metoprolol succinate (TOPROL-XL) 25 MG 24 hr tablet Take 1 tablet (25 mg total) by mouth daily. 90 tablet 3  . oxyCODONE ER (XTAMPZA ER) 18 MG C12A Take 1 capsule by mouth every 12 (twelve) hours. 60 capsule 0  . oxyCODONE-acetaminophen (PERCOCET) 10-325 MG tablet Take 1 tablet by mouth 2 (two) times daily as needed for pain. Must last 30 days. 60 tablet 0  . phenazopyridine (PYRIDIUM) 200 MG tablet Take 200 mg by mouth 3 (three) times daily as needed.    . polyethylene glycol (MIRALAX / GLYCOLAX) packet Take 17 g by mouth daily as needed for mild constipation.     . promethazine (PHENERGAN) 12.5 MG tablet Take 1 tablet (12.5  mg total) by mouth every 8 (eight) hours as needed for nausea or vomiting. 30 tablet 2  . rosuvastatin (CRESTOR) 5 MG tablet Take 5 mg by mouth daily.     . solifenacin (VESICARE) 5 MG tablet Take 5 mg by mouth daily.    . Meth-Hyo-M Bl-Na Phos-Ph Sal (URIBEL) 118 MG CAPS TK 1 C PO Q 6 H PRN    . Vitamin D, Ergocalciferol, (DRISDOL) 50000 units CAPS capsule TK ONE C PO Q WEEK FOR 8 WEEKS  0   No current facility-administered medications for this visit.   Facility-Administered Medications Ordered in Other Visits  Medication Dose Route Frequency Provider Last Rate Last Admin  . epirubicin (ELLENCE) 50 mg in sodium chloride 0.9 % bladder instillation  50 mg Bladder Instillation Once [name], MD      . gemcitabine [facility]) chemo syringe for bladder instillation 2,000 mg  2,000 mg Bladder Instillation Once [name], MD        Allergies:   Patient has no known allergies.   Social History:  The patient  reports that she has been smoking cigarettes. She has a 28.00 pack-year smoking history. She has never used smokeless tobacco. She reports that she does not drink alcohol or use drugs.   Family History:  The patient's   family history includes Cancer in her brother and mother.   ROS:  Please see the history of present illness.   All other systems are personally reviewed and negative.    Exam:    Vital Signs:  BP 120/90   Pulse 99   Wt 120 lb (54.4 kg)   BMI 18.25 kg/m       Labs/Other Tests and Data Reviewed:    Recent Labs: 06/03/2019: ALT 41; Magnesium 2.3 06/17/2019: BUN 9; Creatinine, Ser 0.60; Hemoglobin 13.8; Platelets 162; Potassium 3.6; Sodium 135   Wt Readings from Last 3 Encounters:  06/20/19 120 lb (54.4 kg)  06/19/19 120 lb (54.4 kg)  06/17/19 120 lb (54.4 kg)     Other studies personally reviewed:     ASSESSMENT & PLAN:   Orthostatic Hypotension   Cardiomyopathy nonischemic  Congestive heart failure-chronic systolic  COPD/emphysema   Hypertension  Left bundle branch block  PVCs-infrequent  Sinus bradycardia-borderline   Have recommended thigh sleeves and abdominal binder, and will hold off on mestinon for now  Systolic hypertension is severe and requires ongoing management  Euvolemic continue current meds  BP remains elevated so will continue amlodipine  COVID 19 screen The patient denies symptoms of COVID 19 at this time.  The importance of social distancing was discussed today.  Follow-up:  8 weeks     Current medicines are reviewed at length with the patient today.   The patient does not have concerns regarding her medicines.  The following changes were made today:  none  Labs/ tests ordered today include:   No orders of the defined types were placed in this encounter.   Future tests ( post COVID )     Patient Risk:  after full review of this patients clinical status, I feel that they are at moderate  risk at this time.  Today, I have spent 17  minutes with the patient with telehealth technology discussing the above.  Signed, Virl Axe, MD  06/20/2019 2:19 PM     [address] [place] [address] [phone] (office) [phone] (fax)

## 2019-06-20 ENCOUNTER — Telehealth: Payer: Self-pay | Admitting: *Deleted

## 2019-06-20 ENCOUNTER — Telehealth (INDEPENDENT_AMBULATORY_CARE_PROVIDER_SITE_OTHER): Payer: Medicare Other | Admitting: Internal Medicine

## 2019-06-20 VITALS — BP 120/90 | HR 99 | Wt 120.0 lb

## 2019-06-20 DIAGNOSIS — Z95810 Presence of automatic (implantable) cardiac defibrillator: Secondary | ICD-10-CM

## 2019-06-20 DIAGNOSIS — I447 Left bundle-branch block, unspecified: Secondary | ICD-10-CM | POA: Diagnosis not present

## 2019-06-20 DIAGNOSIS — I420 Dilated cardiomyopathy: Secondary | ICD-10-CM

## 2019-06-20 NOTE — Patient Instructions (Signed)
Medication Instructions:  Your physician recommends that you continue on your current medications as directed. Please refer to the Current Medication list given to you today.  *If you need a refill on your cardiac medications before your next appointment, please call your pharmacy*   Lab Work: None ordered.  If you have labs (blood work) drawn today and your tests are completely normal, you will receive your results only by: Marland Kitchen MyChart Message (if you have MyChart) OR . A paper copy in the mail If you have any lab test that is abnormal or we need to change your treatment, we will call you to review the results.   Testing/Procedures: None ordered.    Follow-Up: At [facility], you and your health needs are our priority.  As part of our continuing mission to provide you with exceptional heart care, we have created designated Provider Care Teams.  These Care Teams include your primary Cardiologist (physician) and Advanced Practice Providers (APPs -  Physician Assistants and Nurse Practitioners) who all work together to provide you with the care you need, when you need it.  We recommend signing up for the patient portal called "MyChart".  Sign up information is provided on this After Visit Summary.  MyChart is used to connect with patients for Virtual Visits (Telemedicine).  Patients are able to view lab/test results, encounter notes, upcoming appointments, etc.  Non-urgent messages can be sent to your provider as well.   To learn more about what you can do with MyChart, go to [email or web address].    Your next appointment:   8 week(s) - 08/15/2019 at 2pm  The format for your next appointment:   Virtual Visit   Provider:   You may see Dr Caryl Comes or one of the following Advanced Practice Providers on your designated Care Team:    Chanetta Marshall, NP  Tommye Standard, PA-C  Legrand Como "[place]" [place], [place]

## 2019-06-20 NOTE — Telephone Encounter (Signed)
Patient daughter states she will not be available from 1:30 -2:00

## 2019-06-20 NOTE — Telephone Encounter (Signed)
Spoke with patient's daughter today, Claiborne Billings.  She is concerned about a couple of things. #1 pharmacy told them that could not pick-up pain medicine til June 21, 2019, reason being that they are having to order [facility] and should have in order by tomorrow and the last fill on oxycodone - apap 10 - 325 mg was 05/25/19 so insurance would not allow the fill.  There is no copay on Xtampza but there is a copay of $120.00.  I will convey this information back to patient and family.  Their other concern is that she is not taking the Acetaminophen around the clock and the daughter feels like she should take this more regularly to help manage pain.  Patient does not feel that it helps very much and only takes sporadically.  Question was asked if she should continue elavil and gabapentin and I did answer that she should in fact continue.  Claiborne Billings wanted me to convey to Dr Holley Raring her concerns about acetaminophen 500 mg tid and see if felt that this was completely necessary or if there were anything else he could offer for pain relief.

## 2019-06-20 NOTE — Telephone Encounter (Signed)
Spoke back with [name] and let her know what I found out from the pharmacy.  Medications will be ready on 06/21/19 and there will be copay of $120 for Xtampza.  I told her I have not heard from Dr Holley Raring yet but If I do hear from him I will let her know tomorrow.

## 2019-06-21 DIAGNOSIS — Z9682 Presence of neurostimulator: Secondary | ICD-10-CM | POA: Diagnosis not present

## 2019-06-21 DIAGNOSIS — R531 Weakness: Secondary | ICD-10-CM | POA: Insufficient documentation

## 2019-06-21 DIAGNOSIS — Z8711 Personal history of peptic ulcer disease: Secondary | ICD-10-CM | POA: Diagnosis not present

## 2019-06-21 DIAGNOSIS — I5022 Chronic systolic (congestive) heart failure: Secondary | ICD-10-CM | POA: Diagnosis not present

## 2019-06-21 DIAGNOSIS — M5115 Intervertebral disc disorders with radiculopathy, thoracolumbar region: Secondary | ICD-10-CM | POA: Diagnosis not present

## 2019-06-21 DIAGNOSIS — I951 Orthostatic hypotension: Secondary | ICD-10-CM | POA: Insufficient documentation

## 2019-06-21 DIAGNOSIS — M1990 Unspecified osteoarthritis, unspecified site: Secondary | ICD-10-CM | POA: Diagnosis not present

## 2019-06-21 DIAGNOSIS — K219 Gastro-esophageal reflux disease without esophagitis: Secondary | ICD-10-CM | POA: Diagnosis not present

## 2019-06-21 DIAGNOSIS — Z95810 Presence of automatic (implantable) cardiac defibrillator: Secondary | ICD-10-CM | POA: Diagnosis not present

## 2019-06-21 DIAGNOSIS — I447 Left bundle-branch block, unspecified: Secondary | ICD-10-CM | POA: Diagnosis not present

## 2019-06-21 DIAGNOSIS — K5900 Constipation, unspecified: Secondary | ICD-10-CM | POA: Diagnosis not present

## 2019-06-21 DIAGNOSIS — F17210 Nicotine dependence, cigarettes, uncomplicated: Secondary | ICD-10-CM | POA: Diagnosis not present

## 2019-06-21 DIAGNOSIS — M4726 Other spondylosis with radiculopathy, lumbar region: Secondary | ICD-10-CM | POA: Diagnosis not present

## 2019-06-21 DIAGNOSIS — R32 Unspecified urinary incontinence: Secondary | ICD-10-CM | POA: Diagnosis not present

## 2019-06-21 DIAGNOSIS — M810 Age-related osteoporosis without current pathological fracture: Secondary | ICD-10-CM | POA: Diagnosis not present

## 2019-06-21 DIAGNOSIS — I110 Hypertensive heart disease with heart failure: Secondary | ICD-10-CM | POA: Diagnosis not present

## 2019-06-21 DIAGNOSIS — C679 Malignant neoplasm of bladder, unspecified: Secondary | ICD-10-CM | POA: Diagnosis not present

## 2019-06-21 DIAGNOSIS — D509 Iron deficiency anemia, unspecified: Secondary | ICD-10-CM | POA: Diagnosis not present

## 2019-06-21 DIAGNOSIS — Z79891 Long term (current) use of opiate analgesic: Secondary | ICD-10-CM | POA: Diagnosis not present

## 2019-06-21 DIAGNOSIS — M4126 Other idiopathic scoliosis, lumbar region: Secondary | ICD-10-CM | POA: Diagnosis not present

## 2019-06-21 DIAGNOSIS — G894 Chronic pain syndrome: Secondary | ICD-10-CM | POA: Diagnosis not present

## 2019-06-21 DIAGNOSIS — I2510 Atherosclerotic heart disease of native coronary artery without angina pectoris: Secondary | ICD-10-CM | POA: Diagnosis not present

## 2019-06-21 DIAGNOSIS — M4805 Spinal stenosis, thoracolumbar region: Secondary | ICD-10-CM | POA: Diagnosis not present

## 2019-06-21 DIAGNOSIS — E785 Hyperlipidemia, unspecified: Secondary | ICD-10-CM | POA: Diagnosis not present

## 2019-06-21 DIAGNOSIS — I420 Dilated cardiomyopathy: Secondary | ICD-10-CM | POA: Diagnosis not present

## 2019-06-21 DIAGNOSIS — J439 Emphysema, unspecified: Secondary | ICD-10-CM | POA: Diagnosis not present

## 2019-06-21 NOTE — Assessment & Plan Note (Signed)
Chronic, mild,  Aggravated by dehydration. encorarged to increase hydration with gatorade

## 2019-06-21 NOTE — Assessment & Plan Note (Signed)
Noninvasive,  She completed 5 of 6 planned weekly infusions of gemcitabine (plus an initial one in January ) before stopping due to severe hypotension and weaknes.s

## 2019-06-21 NOTE — Assessment & Plan Note (Signed)
Chronic,  With spinal stenosis as the initial cause, now aggravated by dehydration chemotherapy.  Home Health RN and PT ordered . she cannot stay alone ; family is staying with her.

## 2019-06-21 NOTE — Assessment & Plan Note (Signed)
Stopping amlodipine.  Continue metoprolol and losartan for now and follow home BPs

## 2019-06-24 ENCOUNTER — Encounter: Payer: Medicare Other | Admitting: Student in an Organized Health Care Education/Training Program

## 2019-06-24 DIAGNOSIS — K5900 Constipation, unspecified: Secondary | ICD-10-CM

## 2019-06-24 DIAGNOSIS — R32 Unspecified urinary incontinence: Secondary | ICD-10-CM

## 2019-06-24 DIAGNOSIS — M4126 Other idiopathic scoliosis, lumbar region: Secondary | ICD-10-CM

## 2019-06-24 DIAGNOSIS — C679 Malignant neoplasm of bladder, unspecified: Secondary | ICD-10-CM

## 2019-06-24 DIAGNOSIS — M4726 Other spondylosis with radiculopathy, lumbar region: Secondary | ICD-10-CM

## 2019-06-24 DIAGNOSIS — M4805 Spinal stenosis, thoracolumbar region: Secondary | ICD-10-CM | POA: Diagnosis not present

## 2019-06-24 DIAGNOSIS — D509 Iron deficiency anemia, unspecified: Secondary | ICD-10-CM

## 2019-06-24 DIAGNOSIS — I420 Dilated cardiomyopathy: Secondary | ICD-10-CM

## 2019-06-24 DIAGNOSIS — F17210 Nicotine dependence, cigarettes, uncomplicated: Secondary | ICD-10-CM

## 2019-06-24 DIAGNOSIS — Z9181 History of falling: Secondary | ICD-10-CM

## 2019-06-24 DIAGNOSIS — Z79891 Long term (current) use of opiate analgesic: Secondary | ICD-10-CM

## 2019-06-24 DIAGNOSIS — I447 Left bundle-branch block, unspecified: Secondary | ICD-10-CM

## 2019-06-24 DIAGNOSIS — Z95810 Presence of automatic (implantable) cardiac defibrillator: Secondary | ICD-10-CM

## 2019-06-24 DIAGNOSIS — Z9682 Presence of neurostimulator: Secondary | ICD-10-CM

## 2019-06-24 DIAGNOSIS — I2510 Atherosclerotic heart disease of native coronary artery without angina pectoris: Secondary | ICD-10-CM

## 2019-06-24 DIAGNOSIS — I951 Orthostatic hypotension: Secondary | ICD-10-CM

## 2019-06-24 DIAGNOSIS — I110 Hypertensive heart disease with heart failure: Secondary | ICD-10-CM

## 2019-06-24 DIAGNOSIS — Z8711 Personal history of peptic ulcer disease: Secondary | ICD-10-CM

## 2019-06-24 DIAGNOSIS — G894 Chronic pain syndrome: Secondary | ICD-10-CM | POA: Diagnosis not present

## 2019-06-24 DIAGNOSIS — M810 Age-related osteoporosis without current pathological fracture: Secondary | ICD-10-CM

## 2019-06-24 DIAGNOSIS — E785 Hyperlipidemia, unspecified: Secondary | ICD-10-CM

## 2019-06-24 DIAGNOSIS — I5022 Chronic systolic (congestive) heart failure: Secondary | ICD-10-CM

## 2019-06-24 DIAGNOSIS — M1990 Unspecified osteoarthritis, unspecified site: Secondary | ICD-10-CM

## 2019-06-24 DIAGNOSIS — K219 Gastro-esophageal reflux disease without esophagitis: Secondary | ICD-10-CM

## 2019-06-24 DIAGNOSIS — J439 Emphysema, unspecified: Secondary | ICD-10-CM | POA: Diagnosis not present

## 2019-06-24 DIAGNOSIS — M5115 Intervertebral disc disorders with radiculopathy, thoracolumbar region: Secondary | ICD-10-CM | POA: Diagnosis not present

## 2019-06-26 ENCOUNTER — Telehealth: Payer: Self-pay | Admitting: Internal Medicine

## 2019-06-26 DIAGNOSIS — J439 Emphysema, unspecified: Secondary | ICD-10-CM | POA: Diagnosis not present

## 2019-06-26 DIAGNOSIS — M4726 Other spondylosis with radiculopathy, lumbar region: Secondary | ICD-10-CM | POA: Diagnosis not present

## 2019-06-26 DIAGNOSIS — G894 Chronic pain syndrome: Secondary | ICD-10-CM | POA: Diagnosis not present

## 2019-06-26 DIAGNOSIS — I110 Hypertensive heart disease with heart failure: Secondary | ICD-10-CM | POA: Diagnosis not present

## 2019-06-26 DIAGNOSIS — M5115 Intervertebral disc disorders with radiculopathy, thoracolumbar region: Secondary | ICD-10-CM | POA: Diagnosis not present

## 2019-06-26 DIAGNOSIS — M4805 Spinal stenosis, thoracolumbar region: Secondary | ICD-10-CM | POA: Diagnosis not present

## 2019-06-26 NOTE — Telephone Encounter (Signed)
Patient calling She believes Rosann Auerbach called this morning and would like to know what it was about Please call to clarify

## 2019-06-27 NOTE — Telephone Encounter (Signed)
PATIENT IS RETURNING THE CALL.

## 2019-06-28 DIAGNOSIS — G894 Chronic pain syndrome: Secondary | ICD-10-CM | POA: Diagnosis not present

## 2019-06-28 DIAGNOSIS — M5115 Intervertebral disc disorders with radiculopathy, thoracolumbar region: Secondary | ICD-10-CM | POA: Diagnosis not present

## 2019-06-28 DIAGNOSIS — M4726 Other spondylosis with radiculopathy, lumbar region: Secondary | ICD-10-CM | POA: Diagnosis not present

## 2019-06-28 DIAGNOSIS — M4805 Spinal stenosis, thoracolumbar region: Secondary | ICD-10-CM | POA: Diagnosis not present

## 2019-06-28 DIAGNOSIS — I110 Hypertensive heart disease with heart failure: Secondary | ICD-10-CM | POA: Diagnosis not present

## 2019-06-28 DIAGNOSIS — J439 Emphysema, unspecified: Secondary | ICD-10-CM | POA: Diagnosis not present

## 2019-06-28 NOTE — Telephone Encounter (Signed)
Attempted phone call to pt.  No answer and no voicemail to leave message.

## 2019-06-28 NOTE — Telephone Encounter (Signed)
Patient calling back to speak with a nurse about some follow up questions from a telehealth visit last week.   Please call when able

## 2019-07-01 DIAGNOSIS — J439 Emphysema, unspecified: Secondary | ICD-10-CM | POA: Diagnosis not present

## 2019-07-01 DIAGNOSIS — I110 Hypertensive heart disease with heart failure: Secondary | ICD-10-CM | POA: Diagnosis not present

## 2019-07-01 DIAGNOSIS — M5115 Intervertebral disc disorders with radiculopathy, thoracolumbar region: Secondary | ICD-10-CM | POA: Diagnosis not present

## 2019-07-01 DIAGNOSIS — M4726 Other spondylosis with radiculopathy, lumbar region: Secondary | ICD-10-CM | POA: Diagnosis not present

## 2019-07-01 DIAGNOSIS — G894 Chronic pain syndrome: Secondary | ICD-10-CM | POA: Diagnosis not present

## 2019-07-01 DIAGNOSIS — M4805 Spinal stenosis, thoracolumbar region: Secondary | ICD-10-CM | POA: Diagnosis not present

## 2019-07-02 DIAGNOSIS — M5115 Intervertebral disc disorders with radiculopathy, thoracolumbar region: Secondary | ICD-10-CM | POA: Diagnosis not present

## 2019-07-02 DIAGNOSIS — J439 Emphysema, unspecified: Secondary | ICD-10-CM | POA: Diagnosis not present

## 2019-07-02 DIAGNOSIS — G894 Chronic pain syndrome: Secondary | ICD-10-CM | POA: Diagnosis not present

## 2019-07-02 DIAGNOSIS — I110 Hypertensive heart disease with heart failure: Secondary | ICD-10-CM | POA: Diagnosis not present

## 2019-07-02 DIAGNOSIS — M4726 Other spondylosis with radiculopathy, lumbar region: Secondary | ICD-10-CM | POA: Diagnosis not present

## 2019-07-02 DIAGNOSIS — M4805 Spinal stenosis, thoracolumbar region: Secondary | ICD-10-CM | POA: Diagnosis not present

## 2019-07-02 NOTE — Telephone Encounter (Signed)
Prolia refusal after PA.

## 2019-07-04 DIAGNOSIS — J439 Emphysema, unspecified: Secondary | ICD-10-CM | POA: Diagnosis not present

## 2019-07-04 DIAGNOSIS — I110 Hypertensive heart disease with heart failure: Secondary | ICD-10-CM | POA: Diagnosis not present

## 2019-07-04 DIAGNOSIS — M4726 Other spondylosis with radiculopathy, lumbar region: Secondary | ICD-10-CM | POA: Diagnosis not present

## 2019-07-04 DIAGNOSIS — M4805 Spinal stenosis, thoracolumbar region: Secondary | ICD-10-CM | POA: Diagnosis not present

## 2019-07-04 DIAGNOSIS — M5115 Intervertebral disc disorders with radiculopathy, thoracolumbar region: Secondary | ICD-10-CM | POA: Diagnosis not present

## 2019-07-04 DIAGNOSIS — G894 Chronic pain syndrome: Secondary | ICD-10-CM | POA: Diagnosis not present

## 2019-07-08 DIAGNOSIS — M5115 Intervertebral disc disorders with radiculopathy, thoracolumbar region: Secondary | ICD-10-CM | POA: Diagnosis not present

## 2019-07-08 DIAGNOSIS — J439 Emphysema, unspecified: Secondary | ICD-10-CM | POA: Diagnosis not present

## 2019-07-08 DIAGNOSIS — G894 Chronic pain syndrome: Secondary | ICD-10-CM | POA: Diagnosis not present

## 2019-07-08 DIAGNOSIS — M4805 Spinal stenosis, thoracolumbar region: Secondary | ICD-10-CM | POA: Diagnosis not present

## 2019-07-08 DIAGNOSIS — I110 Hypertensive heart disease with heart failure: Secondary | ICD-10-CM | POA: Diagnosis not present

## 2019-07-08 DIAGNOSIS — M4726 Other spondylosis with radiculopathy, lumbar region: Secondary | ICD-10-CM | POA: Diagnosis not present

## 2019-07-09 ENCOUNTER — Other Ambulatory Visit: Payer: Self-pay

## 2019-07-09 MED ORDER — ROSUVASTATIN CALCIUM 5 MG PO TABS: 5.0000 mg | ORAL_TABLET | Freq: Every day | ORAL | 0 refills | Status: DC

## 2019-07-09 NOTE — Telephone Encounter (Signed)
Historical medication Last OV: 06/19/2019 Next OV: not scheduled

## 2019-07-10 ENCOUNTER — Ambulatory Visit: Payer: Medicare Other

## 2019-07-10 DIAGNOSIS — I110 Hypertensive heart disease with heart failure: Secondary | ICD-10-CM | POA: Diagnosis not present

## 2019-07-10 DIAGNOSIS — M5115 Intervertebral disc disorders with radiculopathy, thoracolumbar region: Secondary | ICD-10-CM | POA: Diagnosis not present

## 2019-07-10 DIAGNOSIS — M4726 Other spondylosis with radiculopathy, lumbar region: Secondary | ICD-10-CM | POA: Diagnosis not present

## 2019-07-10 DIAGNOSIS — M4805 Spinal stenosis, thoracolumbar region: Secondary | ICD-10-CM | POA: Diagnosis not present

## 2019-07-10 DIAGNOSIS — G894 Chronic pain syndrome: Secondary | ICD-10-CM | POA: Diagnosis not present

## 2019-07-10 DIAGNOSIS — J439 Emphysema, unspecified: Secondary | ICD-10-CM | POA: Diagnosis not present

## 2019-07-11 DIAGNOSIS — M4805 Spinal stenosis, thoracolumbar region: Secondary | ICD-10-CM | POA: Diagnosis not present

## 2019-07-11 DIAGNOSIS — J439 Emphysema, unspecified: Secondary | ICD-10-CM | POA: Diagnosis not present

## 2019-07-11 DIAGNOSIS — I110 Hypertensive heart disease with heart failure: Secondary | ICD-10-CM | POA: Diagnosis not present

## 2019-07-11 DIAGNOSIS — M4726 Other spondylosis with radiculopathy, lumbar region: Secondary | ICD-10-CM | POA: Diagnosis not present

## 2019-07-11 DIAGNOSIS — M5115 Intervertebral disc disorders with radiculopathy, thoracolumbar region: Secondary | ICD-10-CM | POA: Diagnosis not present

## 2019-07-11 DIAGNOSIS — G894 Chronic pain syndrome: Secondary | ICD-10-CM | POA: Diagnosis not present

## 2019-07-15 ENCOUNTER — Encounter: Payer: Self-pay | Admitting: Student in an Organized Health Care Education/Training Program

## 2019-07-15 ENCOUNTER — Ambulatory Visit (INDEPENDENT_AMBULATORY_CARE_PROVIDER_SITE_OTHER): Payer: Medicare Other

## 2019-07-15 ENCOUNTER — Telehealth: Payer: Self-pay

## 2019-07-15 VITALS — BP 144/83 | HR 67 | Ht 68.0 in | Wt 120.0 lb

## 2019-07-15 DIAGNOSIS — Z0000 Encounter for general adult medical examination without abnormal findings: Secondary | ICD-10-CM

## 2019-07-15 NOTE — Progress Notes (Addendum)
Subjective:   [name] is a 79 y.o. female who presents for Medicare Annual (Subsequent) preventive examination.  Review of Systems:  No ROS.  Medicare Wellness Virtual Visit.  Visual/audio telehealth visit. Vital signs provided by patient.  See social history for additional risk factors.   Cardiac Risk Factors include: advanced age (>28men, >47 women);hypertension     Objective:     Vitals: BP (!) 144/83 (BP Location: Left Arm, Patient Position: Sitting, Cuff Size: Normal)   Pulse 67   Ht 5\' 8"  (1.727 m)   Wt 120 lb (54.4 kg)   BMI 18.25 kg/m   Body mass index is 18.25 kg/m.  Advanced Directives 07/15/2019 06/17/2019 06/06/2019 05/01/2019 03/19/2019 02/12/2019 07/09/2018  Does Patient Have a Medical Advance Directive? Yes Yes Yes Yes Yes Yes Yes  Type of Paramedic of [place];Living will [place];Living will [place];Living will [place];Living will [place];Living will [place];Living will [place];Living will  Does patient want to make changes to medical advance directive? No - Patient declined No - Patient declined - No - Patient declined - - No - Patient declined  Copy of [place] in Chart? Yes - validated most recent copy scanned in chart (See row information) No - copy requested - No - copy requested - - Yes - validated most recent copy scanned in chart (See row information)  Would patient like information on creating a medical advance directive? - - - - - - -    Tobacco Social History   Tobacco Use  Smoking Status Current Every Day Smoker  . Packs/day: 0.50  . Years: 56.00  . Pack years: 28.00  . Types: Cigarettes  Smokeless Tobacco Never Used  Tobacco Comment   02-11-2019  per pt currently smokes 10 cig's per day , started smoking age 55     Ready to quit: Not Answered Counseling given: Not  Answered Comment: 02-11-2019  per pt currently smokes 10 cig's per day , started smoking age 74   Clinical Intake:  Pre-visit preparation completed: Yes        Diabetes: No  How often do you need to have someone help you when you read instructions, pamphlets, or other written materials from your doctor or pharmacy?: 1 - Never  Interpreter Needed?: No     Past Medical History:  Diagnosis Date  . AICD (automatic cardioverter/defibrillator) present EP cardiologist--- dr Caryl Comes    placement 05-07-2014 , ef 25%,  NICM/  (02-11-2019 last echo 08-31-2018 ef 60-65%)  . Arthritis    "in about all my joints; for sure in my back"  . Bladder cancer [facility]) urologist-  dr Junious Silk   dx 1995--  recurrent bladder cancer 2015 , s/p TURBT's and chemo instillation's ;   04/ 2019  s/p TURBT  . Chronic hyponatremia   . Chronic low back pain with bilateral sciatica    s/p  spinal cord stimulator @ [facility]  10-09-2018  . Chronic systolic (congestive) heart failure [facility])    cardiologist-  dr Rogue Jury  . COPD with emphysema ([place])    (02-11-2019  per pt has never been on oxygen)  . Coronary artery disease cardiologist-  dr Kathlyn [place]   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% per cardiac cath 03-03-2014  . DDD (degenerative disc disease), thoracolumbar   . Frequent urination   . Full dentures   . Gait instability  due to chronic low back pain, uses roller walker  . GERD (gastroesophageal reflux disease)   . History of iron deficiency anemia 11/2013   resolved w/ IV Iron  (02-11-2019  per pt has not had any issues since 2015)  . History of stomach ulcers 11/2013  . Hypertension   . LBBB (left bundle branch block)   . NICM (nonischemic cardiomyopathy) ([place]) last echo 12-31-2015 ef 55-60%   dx 09/ 2015 per echo 20%;  myoview 09/ 2015 ef 28%;  per cardiac cath 12/ 2015 ef 30%;    . Nocturia more than twice per night   . S/P insertion of spinal cord stimulator followed by [facility]---- Dr Pecola Leisure  (notes in care everywhere)   10-09-2018  @[facility] --- thoracic spinal cord stimular/ generator  (device from [facility])---- per pt has a control  . Scoliosis    Past Surgical History:  Procedure Laterality Date  . BI-VENTRICULAR IMPLANTABLE CARDIOVERTER DEFIBRILLATOR N/A 05/07/2014   Procedure: BI-VENTRICULAR IMPLANTABLE CARDIOVERTER DEFIBRILLATOR  (CRT-D);  Surgeon: Deboraha Sprang, MD;  Location: [facility] CATH LAB;  Service: Cardiovascular;  Laterality: N/A;  . CARDIAC CATHETERIZATION  03-03-2014  dr Kathlyn [place]   [facility]   pLAD 20%, pRCA 20%, dRCA 50%, RPLS 50%;  ef 30%, mild elevated LVEDP, mild gradient across aortic valve LVOT  . CARDIOVASCULAR STRESS TEST  11/25/2013   High risk nuclear study w/ large high severity inferior wall perfusion defect on stress and rest images, large mild severity anteroseptal wall perfusion defect on stress and rest images, No inducible ischemia/ global moderate hypokinesis, ef 28%  . CATARACT EXTRACTION W/ INTRAOCULAR LENS  IMPLANT, BILATERAL Bilateral right 12-2013 / left  02-2014  . CYSTOSCOPY W/ RETROGRADES Bilateral 01/06/2015   Procedure: CYSTOSCOPY WITH  BLADDER BIOPSY BILATERAL RETROGRADE PYELOGRAM,INSTILLATION OF MITOMYCIN C;  Surgeon: [name], MD;  Location: [facility] ORS;  Service: Urology;  Laterality: Bilateral;  . CYSTOSCOPY WITH BIOPSY N/A 10/06/2015   Procedure: CYSTO WITH BLADDER BIOPSY, FULGERATION, CHEMO IRRIGATION EPIRUBICIN IN PACU;  Surgeon: [name], MD;  Location: [facility];  Service: Urology;  Laterality: N/A;  . CYSTOSCOPY WITH BIOPSY N/A 02/12/2019   Procedure: CYSTOSCOPY WITH BIOPSY/ FULGURATION/ INSTILLATION OF GEMCITABINE, bilateral retrograde turbt greater 5cm;  Surgeon: [name], MD;  Location: [facility];  Service: Urology;  Laterality: N/A;  . CYSTOSCOPY WITH FULGERATION N/A 06/30/2017   Procedure: Marland Kitchen CYSTOSCOPY WITH [place] FULGERATION/ BLADDER BIOPSY/ INSTILLATION OF EPIRUBICIN;  Surgeon: [name], MD;  Location: [facility];  Service: Urology;  Laterality: N/A;  . CYSTOSCOPY WITH RETROGRADE PYELOGRAM, URETEROSCOPY AND STENT PLACEMENT Bilateral 04/18/2014   Procedure: CYSTOSCOPY WITH RETROGRADE PYELOGRAM;  Surgeon: [name], MD;  Location: [facility];  Service: Urology;  Laterality: Bilateral;  . ESOPHAGOGASTRODUODENOSCOPY N/A 11/28/2013   Procedure: ESOPHAGOGASTRODUODENOSCOPY (EGD);  Surgeon: Arta Silence, MD;  Location: [facility] ENDOSCOPY;  Service: Endoscopy;  Laterality: N/A;  . LUMBAR [place]  1980's   "ruptured disc"  . 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 TRANSTHORACIC ECHOCARDIOGRAM  12/31/2015   dr Caryl Comes   ef [id number], grade 1 diastolic dysfunction/ mild MR/ septal motion showed abnormal function and dyssynergy  . TRANSURETHRAL RESECTION OF BLADDER  1995  . TRANSURETHRAL RESECTION OF BLADDER TUMOR N/A 12/13/2013   Procedure: TRANSURETHRAL RESECTION OF BLADDER TUMOR (TURBT);  Surgeon: [name], MD;  Location: [facility];  Service: Urology;  Laterality:  N/A;  . TRANSURETHRAL RESECTION OF BLADDER TUMOR N/A 01/17/2014   Procedure: TRANSURETHRAL RESECTION OF BLADDER TUMOR (TURBT);  Surgeon: [name], MD;  Location: [facility];  Service: Urology;  Laterality: N/A;  . TRANSURETHRAL RESECTION OF BLADDER TUMOR N/A 04/18/2014   Procedure: TRANSURETHRAL RESECTION OF BLADDER TUMOR (TURBT), CYSTOGRAM;  Surgeon: [name], MD;  Location: [facility];  Service: Urology;  Laterality: N/A;   Family History  Problem Relation Age of Onset  . Cancer Mother   . Cancer Brother    Social History   Socioeconomic History  . Marital status: Widowed    Spouse name: Not on file  . Number of children: Not on file  . Years of education: Not on file  . Highest education level: Not on file  Occupational History  . Not on file  Tobacco Use  . Smoking status:  Current Every Day Smoker    Packs/day: 0.50    Years: 56.00    Pack years: 28.00    Types: Cigarettes  . Smokeless tobacco: Never Used  . Tobacco comment: 02-11-2019  per pt currently smokes 10 cig's per day , started smoking age 89  Substance and Sexual Activity  . Alcohol use: No  . Drug use: No  . Sexual activity: Never  Other Topics Concern  . Not on file  Social History Narrative  . Not on file   Social Determinants of Health   Financial Resource Strain:   . Difficulty of Paying Living Expenses:   Food Insecurity:   . Worried About Charity fundraiser in the Last Year:   . Arboriculturist in the Last Year:   Transportation Needs:   . Film/video editor (Medical):   Marland Kitchen Lack of Transportation (Non-Medical):   Physical Activity:   . Days of Exercise per Week:   . Minutes of Exercise per Session:   Stress:   . Feeling of Stress :   Social Connections:   . Frequency of Communication with Friends and Family:   . Frequency of Social Gatherings with Friends and Family:   . Attends Religious Services:   . Active Member of Clubs or Organizations:   . Attends Archivist Meetings:   Marland Kitchen Marital Status:     Outpatient Encounter Medications as of 07/15/2019  Medication Sig  . acetaminophen (TYLENOL) 500 MG tablet Take 500 mg by mouth every 8 (eight) hours as needed.  Marland Kitchen amitriptyline (ELAVIL) 10 MG tablet Take 1 tablet (10 mg total) by mouth at bedtime.  Marland Kitchen amLODipine (NORVASC) 2.5 MG tablet Take 1 tablet (2.5 mg total) by mouth daily.  Marland Kitchen gabapentin (NEURONTIN) 300 MG capsule Three times daily as needed for back pain  . losartan (COZAAR) 100 MG tablet Take 1 tablet (100 mg total) by mouth at bedtime.  . Meth-Hyo-M Bl-Na Phos-Ph Sal (URIBEL) 118 MG CAPS TK 1 C PO Q 6 H PRN  . metoprolol succinate (TOPROL-XL) 25 MG 24 hr tablet Take 1 tablet (25 mg total) by mouth daily.  Marland Kitchen oxyCODONE ER (XTAMPZA ER) 18 MG C12A Take 1 capsule by mouth every 12 (twelve) hours.  Marland Kitchen  oxyCODONE-acetaminophen (PERCOCET) 10-325 MG tablet Take 1 tablet by mouth 2 (two) times daily as needed for pain. Must last 30 days.  . phenazopyridine (PYRIDIUM) 200 MG tablet Take 200 mg by mouth 3 (three) times daily as needed.  . polyethylene glycol (MIRALAX / GLYCOLAX) packet Take 17 g by mouth daily as needed for mild constipation.   Marland Kitchen  promethazine (PHENERGAN) 12.5 MG tablet Take 1 tablet (12.5 mg total) by mouth every 8 (eight) hours as needed for nausea or vomiting.  . rosuvastatin (CRESTOR) 5 MG tablet Take 1 tablet (5 mg total) by mouth daily.  . solifenacin (VESICARE) 5 MG tablet Take 5 mg by mouth daily.  . Vitamin D, Ergocalciferol, (DRISDOL) 50000 units CAPS capsule TK ONE C PO Q WEEK FOR 8 WEEKS   Facility-Administered Encounter Medications as of 07/15/2019  Medication  . epirubicin (ELLENCE) 50 mg in sodium chloride 0.9 % bladder instillation  . gemcitabine (GEMZAR) chemo syringe for bladder instillation 2,000 mg    Activities of Daily Living In your present state of health, do you have any difficulty performing the following activities: 07/15/2019 02/12/2019  Hearing? N N  Vision? N N  Difficulty concentrating or making decisions? N N  Walking or climbing stairs? Y Y  Comment Unsteady gait. Walker in use. -  Dressing or bathing? N N  Doing errands, shopping? N -  Preparing Food and eating ? N -  Comment Patient heats frozen dinners or has meals prepared by daughter. Self feeds. -  Using the Toilet? N -  In the past six months, have you accidently leaked urine? Y -  Comment Managed with a daily pad -  Do you have problems with loss of bowel control? N -  Managing your Medications? N -  Managing your Finances? N -  Housekeeping or managing your Housekeeping? Y -  Comment Daughter assist -  Some recent data might be hidden    Patient Care Team: [name], MD as PCP - General (Internal Medicine) Briscoe Deutscher, MD (Family Medicine)    Assessment:   This is a  routine wellness examination for [place].  Nurse connected with patient 07/15/19 at 11:00 AM EDT by a telephone enabled telemedicine application and verified that I am speaking with the correct person using two identifiers. Patient stated full name and DOB. Patient gave permission to continue with virtual visit. Patient's location was at home and Nurse's location was at [place] office.   Patient is alert and oriented x3. Patient denies difficulty focusing or concentrating. Patient likes to watch the news on television keeps herself active to avoid boredom for brain health.   Health Maintenance Due: -Tdap and Shingles vaccine- discussed; to be completed with doctor in visit or local pharmacy.   -Covid vaccine- plans to schedule later in the season -Dexa Scan- deferred per patient  See completed HM at the end of note.   Eye: Visual acuity not assessed. Virtual visit. Followed by their ophthalmologist.  Dental: Dentures- yes  Hearing: Demonstrates normal hearing during visit.  Safety:  Patient feels safe at home- yes Patient does have smoke detectors at home- yes Patient does wear sunscreen or protective clothing when in direct sunlight - yes Patient does wear seat belt when in a moving vehicle - yes Patient drives- yes Adequate lighting in walkways free from debris- yes Grab bars and handrails used as appropriate- yes Ambulates with an assistive device- yes; walker Cell phone and life alert on person when ambulating outside of the home- yes  Social: Alcohol intake - no  Smoking history- current Illicit drug use? none  Medication: Taking as directed and without issues.  Pill box in use -yes  Self managed - yes   Covid-19: Precautions and sickness symptoms discussed. Wears mask, social distancing, hand hygiene as appropriate.   Activities of Daily Living Patient denies needing assistance with: feeding  themselves, getting from bed to chair, getting to the toilet,  bathing/showering, dressing, managing money.  Assisted by daughter with household chores and meal prep as needed.   Discussed the importance of a healthy diet, water intake and the benefits of aerobic exercise.   Physical activity- active around the home, no routine. Encouraged to stay active.   Diet:  Low cholesterol/low carb Water: good intake Caffeine: 1 cup of coffee  Other Providers Patient Care Team: [name], MD as PCP - General (Internal Medicine) Briscoe Deutscher, MD (Family Medicine)  Exercise Activities and Dietary recommendations    Goals      Patient Stated   . Increase physical activity (pt-stated)     Stay active       Fall Risk Fall Risk  07/15/2019 06/19/2019 02/07/2019 12/10/2018 07/09/2018  Falls in the past year? 0 0 0 0 0  Risk for fall due to : - - - Impaired mobility -  Follow up Falls evaluation completed Falls evaluation completed - Falls evaluation completed -   Timed Get Up and Go performed: no, virtual visit  Depression Screen PHQ 2/9 Scores 07/15/2019 07/09/2018 05/08/2018 02/06/2018  PHQ - 2 Score 0 0 0 0     Cognitive Function     6CIT Screen 07/15/2019 07/09/2018 06/09/2017 05/13/2016  What Year? 0 points 0 points 0 points 0 points  What month? 0 points 0 points 0 points 0 points  What time? 0 points 0 points 0 points 0 points  Count back from 20 - 0 points 0 points 0 points  Months in reverse 0 points 0 points 0 points 0 points  Repeat phrase 0 points 0 points 0 points 0 points  Total Score - 0 0 0    Immunization History  Administered Date(s) Administered  . Fluad Quad(high Dose 65+) 12/10/2018  . Influenza, High Dose Seasonal PF 01/29/2018  . Influenza,inj,Quad PF,6+ Mos 12/27/2013  . Influenza-Unspecified 12/24/2015, 02/17/2017  . Pneumococcal Conjugate-13 12/27/2013  . Pneumococcal Polysaccharide-23 06/07/2018   Screening Tests Health Maintenance  Topic Date Due  . COVID-19 Vaccine (1) Never done  . TETANUS/TDAP  Never  done  . DEXA SCAN  Never done  . INFLUENZA VACCINE  10/20/2019  . PNA vac Low Risk Adult  Completed      Plan:   Keep all routine maintenance appointments.   Patient requests information with resources offered by medicare. Information mailed to patient.   Medicare Attestation I have personally reviewed: The patient's medical and social history Their use of alcohol, tobacco or illicit drugs Their current medications and supplements The patient's functional ability including ADLs,fall risks, home safety risks, cognitive, and hearing and visual impairment Diet and physical activities Evidence for depression    I have reviewed and discussed with patient certain preventive protocols, quality metrics, and best practice recommendations.     OBrien-Blaney, Deriyah Kunath L, LPN  [phone]    I have reviewed the above information and agree with above.   Deborra Medina, MD

## 2019-07-15 NOTE — Patient Instructions (Addendum)
  [name] , Thank you for taking time to come for your Medicare Wellness Visit. I appreciate your ongoing commitment to your health goals. Please review the following plan we discussed and let me know if I can assist you in the future.   These are the goals we discussed: Goals      Patient Stated   . Increase physical activity (pt-stated)     Stay active       This is a list of the screening recommended for you and due dates:  Health Maintenance  Topic Date Due  . COVID-19 Vaccine (1) Never done  . Tetanus Vaccine  Never done  . DEXA scan (bone density measurement)  Never done  . Flu Shot  10/20/2019  . Pneumonia vaccines  Completed

## 2019-07-15 NOTE — Telephone Encounter (Signed)
LM for patient to call office for pre virtual appointment questions.  

## 2019-07-16 ENCOUNTER — Ambulatory Visit
Payer: Medicare Other | Attending: Student in an Organized Health Care Education/Training Program | Admitting: Student in an Organized Health Care Education/Training Program

## 2019-07-16 ENCOUNTER — Encounter: Payer: Self-pay | Admitting: Student in an Organized Health Care Education/Training Program

## 2019-07-16 ENCOUNTER — Other Ambulatory Visit: Payer: Self-pay

## 2019-07-16 DIAGNOSIS — M4805 Spinal stenosis, thoracolumbar region: Secondary | ICD-10-CM | POA: Diagnosis not present

## 2019-07-16 DIAGNOSIS — Z9689 Presence of other specified functional implants: Secondary | ICD-10-CM

## 2019-07-16 DIAGNOSIS — G894 Chronic pain syndrome: Secondary | ICD-10-CM

## 2019-07-16 DIAGNOSIS — M4726 Other spondylosis with radiculopathy, lumbar region: Secondary | ICD-10-CM | POA: Diagnosis not present

## 2019-07-16 DIAGNOSIS — M5136 Other intervertebral disc degeneration, lumbar region: Secondary | ICD-10-CM | POA: Diagnosis not present

## 2019-07-16 DIAGNOSIS — M5416 Radiculopathy, lumbar region: Secondary | ICD-10-CM

## 2019-07-16 DIAGNOSIS — M47816 Spondylosis without myelopathy or radiculopathy, lumbar region: Secondary | ICD-10-CM | POA: Diagnosis not present

## 2019-07-16 DIAGNOSIS — M48062 Spinal stenosis, lumbar region with neurogenic claudication: Secondary | ICD-10-CM | POA: Diagnosis not present

## 2019-07-16 DIAGNOSIS — I110 Hypertensive heart disease with heart failure: Secondary | ICD-10-CM | POA: Diagnosis not present

## 2019-07-16 DIAGNOSIS — M5115 Intervertebral disc disorders with radiculopathy, thoracolumbar region: Secondary | ICD-10-CM | POA: Diagnosis not present

## 2019-07-16 DIAGNOSIS — J439 Emphysema, unspecified: Secondary | ICD-10-CM | POA: Diagnosis not present

## 2019-07-16 MED ORDER — OXYCODONE-ACETAMINOPHEN 10-325 MG PO TABS: 1.0000 | ORAL_TABLET | ORAL | 0 refills | Status: DC | PRN

## 2019-07-16 NOTE — Progress Notes (Signed)
Patient: [name]  Service Category: E/M  Provider: [name], MD  DOB: 05-Jan-1941  DOS: 07/16/2019  Location: Office  MRN: [id number]  Setting: Ambulatory outpatient  Referring Provider: [name], MD  Type: Established Patient  Specialty: Interventional Pain Management  PCP: [name], MD  Location: Home  Delivery: TeleHealth     Virtual Encounter - Pain Management PROVIDER NOTE: Information contained herein reflects review and annotations entered in association with encounter. Interpretation of such information and data should be left to medically-trained personnel. Information provided to patient can be located elsewhere in the medical record under "Patient Instructions". Document created using STT-dictation technology, any transcriptional errors that may result from process are unintentional.    Contact & Pharmacy Preferred: [phone] Home: [phone] (home) Mobile: [phone] (mobile) E-mail: [id number] [name], [place] AT [place] [address] Phone: [phone] Fax: [phone]   Pre-screening  [name] offered "in-person" vs "virtual" encounter. She indicated preferring virtual for this encounter.   Reason COVID-19*  Social distancing based on CDC and AMA recommendations.   I contacted [name] on 07/16/2019 via video conference.      I clearly identified myself as [name], MD. I verified that I was speaking with the correct person using two identifiers (Name: [name], and date of birth: [birth year]).  Consent I sought verbal advanced consent from [name] for virtual visit interactions. I informed [name] of possible security and privacy concerns, risks, and limitations associated with providing "not-in-person" medical evaluation and management services. I also informed [name] of the availability of "in-person"  appointments. Finally, I informed her that there would be a charge for the virtual visit and that she could be  personally, fully or partially, financially responsible for it. [name] expressed understanding and agreed to proceed.   Historic Elements   [name] is a 79 y.o. year old, female patient evaluated today after her last contact with our practice on 07/15/2019. [name]  has a past medical history of AICD (automatic cardioverter/defibrillator) present (EP cardiologist--- dr Caryl Comes ), Arthritis, Bladder cancer [facility]) (urologist-  dr Junious Silk), Chronic hyponatremia, Chronic low back pain with bilateral sciatica, Chronic systolic (congestive) heart failure ([place]), COPD with emphysema ([place]), Coronary artery disease (cardiologist-  dr Kathlyn [place]), DDD (degenerative disc disease), thoracolumbar, Frequent urination, Full dentures, Gait instability, GERD (gastroesophageal reflux disease), History of iron deficiency anemia (11/2013), History of stomach ulcers (11/2013), Hypertension, LBBB (left bundle branch block), NICM (nonischemic cardiomyopathy) ([place]) (last echo 12-31-2015 ef 55-60%), Nocturia more than twice per night, S/P insertion of spinal cord stimulator (followed by [facility]---- Dr Pecola Leisure (notes in care everywhere)), and Scoliosis. She also  has a past surgical history that includes Esophagogastroduodenoscopy (N/A, 11/28/2013); Transurethral resection of bladder tumor (N/A, 12/13/2013); Transurethral resection of bladder tumor (N/A, 01/17/2014); Cataract extraction w/ intraocular lens  implant, bilateral (Bilateral, right 12-2013 / left  02-2014); Cystoscopy with retrograde pyelogram, ureteroscopy and stent placement (Bilateral, 04/18/2014); Transurethral resection of bladder tumor (N/A, 04/18/2014); Lumbar disc surgery (1980's); bi-ventricular implantable cardioverter defibrillator (N/A, 05/07/2014); Cystoscopy w/ retrogrades (Bilateral, 01/06/2015); Cystoscopy with biopsy (N/A, 10/06/2015);  Cardiac catheterization (03-03-2014  dr Kathlyn [place]   [facility]); Transurethral resection of bladder (1995); Cardiovascular stress test (11/25/2013); transthoracic echocardiogram (12/31/2015   dr Caryl Comes); Cystoscopy with fulgeration (N/A, 06/30/2017); Spinal cord stimulator implant (10-09-2018   @  [facility]); and Cystoscopy with biopsy (N/A, 02/12/2019). [name] has a current medication list which includes the following prescription(s): acetaminophen, amitriptyline, amlodipine, gabapentin, losartan, metoprolol succinate, [START ON 07/20/2019] oxycodone-acetaminophen, polyethylene glycol, promethazine, rosuvastatin, solifenacin, uribel, phenazopyridine, and vitamin d (ergocalciferol), and the following Facility-Administered Medications: epirubicin (ELLENCE) 50 mg in sodium chloride 0.9 % bladder instillation and gemcitabine. She  reports that she has been smoking cigarettes. She has a 28.00 pack-year smoking history. She has never used smokeless tobacco. She reports that she does not drink alcohol or use drugs. [name] has No Known Allergies.   HPI  Today, she is being contacted for medication management.   States that she does not feel any improved analgesic benefit with long-acting Xtampza ER that was added at her last visit.  At that visit, her immediate release oxycodone prescription was weaned to twice daily as needed given that she was starting a long-acting opioid analgesic.  Patient states that she would like to go back to oxycodone immediate release and take it every 4-6 hours as needed.  I cautioned the patient on opioid-induced hyperalgesia and the risks of dose escalation.  She states that she would like to discontinue the [facility] ER.  We will increase her oxycodone to 10 mg every 4-6 hours, maximum 5 tablets a day, quantity 150/month.  Patient is in agreement with this plan.  She will follow up face-to-face in 1 month.  Pharmacotherapy Assessment  Analgesic: 06/21/2019  1   06/19/2019  Xtampza Er 18 MG  Capsule  60.00  30 Bi Lat   [id number]   [facility] ([id number])   0  54.00 MME  Comm Ins   [place]  06/20/2019  1   06/19/2019  Oxycodone-Acetaminophen 10-325  60.00  30 Bi Lat   [id number]   [facility] ([id number])   0  30.00 MME  Comm Ins   [place]    Monitoring: [place] PMP: PDMP reviewed during this encounter.       Pharmacotherapy: No side-effects or adverse reactions reported. Compliance: No problems identified. Effectiveness: Clinically acceptable. Plan: Refer to "POC".  UDS:  Summary  Date Value Ref Range Status  02/06/2018 FINAL  Final    Comment:    ==================================================================== TOXASSURE SELECT 13 (MW) ==================================================================== Test                             Result       Flag       Units Drug Present   Hydrocodone                    3616                    ng/mg creat   Hydromorphone                  1604                    ng/mg creat   Dihydrocodeine                 328                     ng/mg creat   Norhydrocodone                 5344                    ng/mg creat  Sources of hydrocodone include scheduled prescription    medications. Hydromorphone, dihydrocodeine and norhydrocodone are    expected metabolites of hydrocodone. Hydromorphone and    dihydrocodeine are also available as scheduled prescription    medications. ==================================================================== Test                      Result    Flag   Units      Ref Range   Creatinine              25               mg/dL      >=20 ==================================================================== Declared Medications:  Medication list was not provided. ==================================================================== For clinical consultation, please call [phone]. ====================================================================    Laboratory Chemistry Profile   Renal Lab Results  Component Value Date   BUN 9 06/17/2019    CREATININE 0.60 06/17/2019   BCR 18 04/28/2014   GFR 108.68 01/11/2019   GFRAA >60 06/17/2019   GFRNONAA >60 06/17/2019     Hepatic Lab Results  Component Value Date   AST 43 (H) 06/03/2019   ALT 41 06/03/2019   ALBUMIN 4.3 06/03/2019   ALKPHOS 77 06/03/2019   LIPASE 30 11/23/2013     Electrolytes Lab Results  Component Value Date   NA 135 06/17/2019   K 3.6 06/17/2019   CL 98 06/17/2019   CALCIUM 9.2 06/17/2019   MG 2.3 06/03/2019     Bone Lab Results  Component Value Date   VD25OH 36.17 01/11/2019     Inflammation (CRP: Acute Phase) (ESR: Chronic Phase) No results found for: CRP, ESRSEDRATE, LATICACIDVEN     Note: Above Lab results reviewed.  Imaging  DG Abd Portable 2 Views CLINICAL DATA:  Rectal pain for 1 month, constipation, history of bladder cancer  EXAM: PORTABLE ABDOMEN - 2 VIEW  COMPARISON:  05/24/2016  FINDINGS: 2 supine frontal views of the abdomen and pelvis are obtained. Multi lead AICD is identified. Spinal stimulator generator overlies left mid abdomen, leads over midthoracic central canal. Bowel gas pattern is unremarkable. Minimal retained stool. No obstruction or ileus. No masses or abnormal calcifications.  Marked right convex scoliosis with multilevel spondylosis and facet hypertrophy. Bilateral hip osteoarthritis.  IMPRESSION: 1. Unremarkable bowel gas pattern.  Electronically Signed   By: Randa Ngo M.D.   On: 06/17/2019 16:31  Assessment  The primary encounter diagnosis was Lumbar radiculopathy. Diagnoses of Chronic pain syndrome, Lumbar degenerative disc disease, Spinal stenosis, lumbar region, with neurogenic claudication, Lumbar facet arthropathy, Lumbar spondylosis, and Spinal cord stimulator status were also pertinent to this visit.  Plan of Care  [name] has a current medication list which includes the following long-term medication(s): amitriptyline, amlodipine, gabapentin, losartan, metoprolol  succinate, promethazine, and rosuvastatin.  1.  Discontinue Extampza 2.  Prescription for oxycodone as below, will increase frequency to every 4-6 hours as needed, maximum 5 tablets a day, 150 tablets for the month. 3.  Continue amitriptyline 10 mg nightly, Tylenol 500 mg per 8 hours as needed, gabapentin as prescribed.  No refills needed. 4.  Continue spinal cord stimulation.  Pharmacotherapy (Medications Ordered): Meds ordered this encounter  Medications  . oxyCODONE-acetaminophen (PERCOCET) 10-325 MG tablet    Sig: Take 1 tablet by mouth every 4 (four) hours as needed for pain. Must last 30 days.    Dispense:  150 tablet    Refill:  0    Chronic Pain. (STOP Act - Not  applicable). Fill one day early if closed on scheduled refill date.  Max 5 tablets a daily, 150/month.    Follow-up plan:   Return in about 4 weeks (around 08/13/2019) for Medication Management, in person, (UDS).    Recent Visits Date Type Provider Dept  06/19/19 Office Visit [name], MD [facility]  Showing recent visits within past 90 days and meeting all other requirements   Today's Visits Date Type Provider Dept  07/16/19 Telemedicine [name], MD [facility]  Showing today's visits and meeting all other requirements   Future Appointments No visits were found meeting these conditions.  Showing future appointments within next 90 days and meeting all other requirements   I discussed the assessment and treatment plan with the patient. The patient was provided an opportunity to ask questions and all were answered. The patient agreed with the plan and demonstrated an understanding of the instructions.  Patient advised to call back or seek an in-person evaluation if the symptoms or condition worsens.  Duration of encounter:30mnutes.  Note by: BGillis Santa MD Date: 07/16/2019; Time: 2:34 PM

## 2019-07-18 NOTE — Telephone Encounter (Signed)
Attempted phone call to pt and left voicemail message to contact RN at [phone] if pt still had questions related to her last visit.

## 2019-07-21 DIAGNOSIS — M4805 Spinal stenosis, thoracolumbar region: Secondary | ICD-10-CM | POA: Diagnosis not present

## 2019-07-21 DIAGNOSIS — J439 Emphysema, unspecified: Secondary | ICD-10-CM | POA: Diagnosis not present

## 2019-07-21 DIAGNOSIS — Z95810 Presence of automatic (implantable) cardiac defibrillator: Secondary | ICD-10-CM | POA: Diagnosis not present

## 2019-07-21 DIAGNOSIS — R32 Unspecified urinary incontinence: Secondary | ICD-10-CM | POA: Diagnosis not present

## 2019-07-21 DIAGNOSIS — I110 Hypertensive heart disease with heart failure: Secondary | ICD-10-CM | POA: Diagnosis not present

## 2019-07-21 DIAGNOSIS — M1990 Unspecified osteoarthritis, unspecified site: Secondary | ICD-10-CM | POA: Diagnosis not present

## 2019-07-21 DIAGNOSIS — M5115 Intervertebral disc disorders with radiculopathy, thoracolumbar region: Secondary | ICD-10-CM | POA: Diagnosis not present

## 2019-07-21 DIAGNOSIS — K219 Gastro-esophageal reflux disease without esophagitis: Secondary | ICD-10-CM | POA: Diagnosis not present

## 2019-07-21 DIAGNOSIS — M4126 Other idiopathic scoliosis, lumbar region: Secondary | ICD-10-CM | POA: Diagnosis not present

## 2019-07-21 DIAGNOSIS — F17210 Nicotine dependence, cigarettes, uncomplicated: Secondary | ICD-10-CM | POA: Diagnosis not present

## 2019-07-21 DIAGNOSIS — M4726 Other spondylosis with radiculopathy, lumbar region: Secondary | ICD-10-CM | POA: Diagnosis not present

## 2019-07-21 DIAGNOSIS — Z8711 Personal history of peptic ulcer disease: Secondary | ICD-10-CM | POA: Diagnosis not present

## 2019-07-21 DIAGNOSIS — D509 Iron deficiency anemia, unspecified: Secondary | ICD-10-CM | POA: Diagnosis not present

## 2019-07-21 DIAGNOSIS — Z9682 Presence of neurostimulator: Secondary | ICD-10-CM | POA: Diagnosis not present

## 2019-07-21 DIAGNOSIS — I2510 Atherosclerotic heart disease of native coronary artery without angina pectoris: Secondary | ICD-10-CM | POA: Diagnosis not present

## 2019-07-21 DIAGNOSIS — I5022 Chronic systolic (congestive) heart failure: Secondary | ICD-10-CM | POA: Diagnosis not present

## 2019-07-21 DIAGNOSIS — G894 Chronic pain syndrome: Secondary | ICD-10-CM | POA: Diagnosis not present

## 2019-07-21 DIAGNOSIS — I420 Dilated cardiomyopathy: Secondary | ICD-10-CM | POA: Diagnosis not present

## 2019-07-21 DIAGNOSIS — I447 Left bundle-branch block, unspecified: Secondary | ICD-10-CM | POA: Diagnosis not present

## 2019-07-21 DIAGNOSIS — I951 Orthostatic hypotension: Secondary | ICD-10-CM | POA: Diagnosis not present

## 2019-07-21 DIAGNOSIS — Z79891 Long term (current) use of opiate analgesic: Secondary | ICD-10-CM | POA: Diagnosis not present

## 2019-07-21 DIAGNOSIS — K5900 Constipation, unspecified: Secondary | ICD-10-CM | POA: Diagnosis not present

## 2019-07-21 DIAGNOSIS — C679 Malignant neoplasm of bladder, unspecified: Secondary | ICD-10-CM | POA: Diagnosis not present

## 2019-07-21 DIAGNOSIS — M810 Age-related osteoporosis without current pathological fracture: Secondary | ICD-10-CM | POA: Diagnosis not present

## 2019-07-21 DIAGNOSIS — E785 Hyperlipidemia, unspecified: Secondary | ICD-10-CM | POA: Diagnosis not present

## 2019-07-24 DIAGNOSIS — J439 Emphysema, unspecified: Secondary | ICD-10-CM | POA: Diagnosis not present

## 2019-07-24 DIAGNOSIS — I110 Hypertensive heart disease with heart failure: Secondary | ICD-10-CM | POA: Diagnosis not present

## 2019-07-24 DIAGNOSIS — M4805 Spinal stenosis, thoracolumbar region: Secondary | ICD-10-CM | POA: Diagnosis not present

## 2019-07-24 DIAGNOSIS — G894 Chronic pain syndrome: Secondary | ICD-10-CM | POA: Diagnosis not present

## 2019-07-24 DIAGNOSIS — M4726 Other spondylosis with radiculopathy, lumbar region: Secondary | ICD-10-CM | POA: Diagnosis not present

## 2019-07-24 DIAGNOSIS — M5115 Intervertebral disc disorders with radiculopathy, thoracolumbar region: Secondary | ICD-10-CM | POA: Diagnosis not present

## 2019-07-26 ENCOUNTER — Telehealth: Payer: Self-pay | Admitting: Internal Medicine

## 2019-07-26 NOTE — Telephone Encounter (Signed)
Pt called possible UTI needs medication. No available appt

## 2019-07-26 NOTE — Telephone Encounter (Signed)
Spoke with pt and she stated that she reached out to her "bladder" doctor. Pt stated that she had some sulfamethazole from the beginning of the year that she did not take and he advised pt to go ahead and take one twice daily for three days.

## 2019-07-29 DIAGNOSIS — I110 Hypertensive heart disease with heart failure: Secondary | ICD-10-CM | POA: Diagnosis not present

## 2019-07-29 DIAGNOSIS — M4726 Other spondylosis with radiculopathy, lumbar region: Secondary | ICD-10-CM | POA: Diagnosis not present

## 2019-07-29 DIAGNOSIS — G894 Chronic pain syndrome: Secondary | ICD-10-CM | POA: Diagnosis not present

## 2019-07-29 DIAGNOSIS — J439 Emphysema, unspecified: Secondary | ICD-10-CM | POA: Diagnosis not present

## 2019-07-29 DIAGNOSIS — M5115 Intervertebral disc disorders with radiculopathy, thoracolumbar region: Secondary | ICD-10-CM | POA: Diagnosis not present

## 2019-07-29 DIAGNOSIS — M4805 Spinal stenosis, thoracolumbar region: Secondary | ICD-10-CM | POA: Diagnosis not present

## 2019-07-30 ENCOUNTER — Telehealth: Payer: Self-pay

## 2019-07-30 MED ORDER — LOSARTAN POTASSIUM 100 MG PO TABS: 100.0000 mg | ORAL_TABLET | Freq: Every day | ORAL | 0 refills | Status: DC

## 2019-07-30 MED ORDER — PANTOPRAZOLE SODIUM 40 MG PO TBEC: 40.0000 mg | DELAYED_RELEASE_TABLET | Freq: Every day | ORAL | 3 refills | Status: DC

## 2019-07-30 NOTE — Telephone Encounter (Signed)
Pantoprazole refilled. 

## 2019-07-30 NOTE — Telephone Encounter (Signed)
Refilled request for Pantoprazole 40 mg. Not in current medication list.  Last Refilled: 11/01/2017 Last OV: 06/19/2019 Next OV: not scheduled

## 2019-07-30 NOTE — Addendum Note (Signed)
Addended by: [name] on: 07/30/2019 03:50 PM   Modules accepted: Orders

## 2019-08-02 DIAGNOSIS — M5115 Intervertebral disc disorders with radiculopathy, thoracolumbar region: Secondary | ICD-10-CM | POA: Diagnosis not present

## 2019-08-02 DIAGNOSIS — M4805 Spinal stenosis, thoracolumbar region: Secondary | ICD-10-CM | POA: Diagnosis not present

## 2019-08-02 DIAGNOSIS — G894 Chronic pain syndrome: Secondary | ICD-10-CM | POA: Diagnosis not present

## 2019-08-02 DIAGNOSIS — J439 Emphysema, unspecified: Secondary | ICD-10-CM | POA: Diagnosis not present

## 2019-08-02 DIAGNOSIS — I110 Hypertensive heart disease with heart failure: Secondary | ICD-10-CM | POA: Diagnosis not present

## 2019-08-02 DIAGNOSIS — M4726 Other spondylosis with radiculopathy, lumbar region: Secondary | ICD-10-CM | POA: Diagnosis not present

## 2019-08-13 ENCOUNTER — Ambulatory Visit
Payer: Medicare Other | Attending: Student in an Organized Health Care Education/Training Program | Admitting: Student in an Organized Health Care Education/Training Program

## 2019-08-13 ENCOUNTER — Encounter: Payer: Self-pay | Admitting: Student in an Organized Health Care Education/Training Program

## 2019-08-13 VITALS — BP 150/77 | HR 60 | Temp 96.6°F | Resp 16 | Ht 68.0 in | Wt 120.0 lb

## 2019-08-13 DIAGNOSIS — M5416 Radiculopathy, lumbar region: Secondary | ICD-10-CM | POA: Diagnosis not present

## 2019-08-13 DIAGNOSIS — M5136 Other intervertebral disc degeneration, lumbar region: Secondary | ICD-10-CM | POA: Diagnosis not present

## 2019-08-13 DIAGNOSIS — M48062 Spinal stenosis, lumbar region with neurogenic claudication: Secondary | ICD-10-CM | POA: Diagnosis not present

## 2019-08-13 DIAGNOSIS — M47816 Spondylosis without myelopathy or radiculopathy, lumbar region: Secondary | ICD-10-CM | POA: Diagnosis not present

## 2019-08-13 DIAGNOSIS — G894 Chronic pain syndrome: Secondary | ICD-10-CM | POA: Insufficient documentation

## 2019-08-13 MED ORDER — OXYCODONE-ACETAMINOPHEN 10-325 MG PO TABS: 1.0000 | ORAL_TABLET | ORAL | 0 refills | Status: DC | PRN

## 2019-08-13 MED ORDER — OXYCODONE-ACETAMINOPHEN 10-325 MG PO TABS: 1.0000 | ORAL_TABLET | ORAL | 0 refills | Status: AC | PRN

## 2019-08-13 MED ORDER — AMITRIPTYLINE HCL 10 MG PO TABS: 10.0000 mg | ORAL_TABLET | Freq: Every day | ORAL | 2 refills | Status: DC

## 2019-08-13 NOTE — Progress Notes (Deleted)
Patient: [name]  Service Category: E/M  Provider: [name], MD  DOB: October 14, 1940  DOS: 08/13/2019  Location: Office  MRN: [id number]  Setting: Ambulatory outpatient  Referring Provider: [name], MD  Type: Established Patient  Specialty: Interventional Pain Management  PCP: [name], MD  Location: Remote location  Delivery: TeleHealth     Virtual Encounter - Pain Management PROVIDER NOTE: Information contained herein reflects review and annotations entered in association with encounter. Interpretation of such information and data should be left to medically-trained personnel. Information provided to patient can be located elsewhere in the medical record under "Patient Instructions". Document created using STT-dictation technology, any transcriptional errors that may result from process are unintentional.    Contact & Pharmacy Preferred: [phone] Home: [phone] (home) Mobile: [phone] (mobile) E-mail: [id number] [name], [place] AT [place] [address] Phone: [phone] Fax: [phone]   Pre-screening  Ms. Everhart offered "in-person" vs "virtual" encounter. She indicated preferring virtual for this encounter.   Reason COVID-19*  Social distancing based on CDC and AMA recommendations.   I contacted [name] on 08/13/2019 via telephone.      I clearly identified myself as [name], MD. I verified that I was speaking with the correct person using two identifiers (Name: JUNELLE HASHEMI, and date of birth: 05/10/1940).  Consent I sought verbal advanced consent from [name] for virtual visit interactions. I informed Ms. Thatch of possible security and privacy concerns, risks, and limitations associated with providing "not-in-person" medical evaluation and management services. I also informed Ms. Winterton of the availability of "in-person"  appointments. Finally, I informed her that there would be a charge for the virtual visit and that she could be  personally, fully or partially, financially responsible for it. Ms. Halberg expressed understanding and agreed to proceed.   Historic Elements   Ms. KATELAN HIRT is a 79 y.o. year old, female patient evaluated today after her last contact with our practice on 07/15/2019. Ms. Greeson  has a past medical history of AICD (automatic cardioverter/defibrillator) present (EP cardiologist--- dr Caryl Comes ), Arthritis, Bladder cancer [facility]) (urologist-  dr Junious Silk), Chronic hyponatremia, Chronic low back pain with bilateral sciatica, Chronic systolic (congestive) heart failure ([place]), COPD with emphysema ([place]), Coronary artery disease (cardiologist-  dr Kathlyn [place]), DDD (degenerative disc disease), thoracolumbar, Frequent urination, Full dentures, Gait instability, GERD (gastroesophageal reflux disease), History of iron deficiency anemia (11/2013), History of stomach ulcers (11/2013), Hypertension, LBBB (left bundle branch block), NICM (nonischemic cardiomyopathy) ([place]) (last echo 12-31-2015 ef 55-60%), Nocturia more than twice per night, S/P insertion of spinal cord stimulator (followed by [facility]---- Dr Pecola Leisure (notes in care everywhere)), and Scoliosis. She also  has a past surgical history that includes Esophagogastroduodenoscopy (N/A, 11/28/2013); Transurethral resection of bladder tumor (N/A, 12/13/2013); Transurethral resection of bladder tumor (N/A, 01/17/2014); Cataract extraction w/ intraocular lens  implant, bilateral (Bilateral, right 12-2013 / left  02-2014); Cystoscopy with retrograde pyelogram, ureteroscopy and stent placement (Bilateral, 04/18/2014); Transurethral resection of bladder tumor (N/A, 04/18/2014); Lumbar disc surgery (1980's); bi-ventricular implantable cardioverter defibrillator (N/A, 05/07/2014); Cystoscopy w/ retrogrades (Bilateral, 01/06/2015); Cystoscopy with biopsy (N/A, 10/06/2015);  Cardiac catheterization (03-03-2014  dr Kathlyn [place]   [facility]); Transurethral resection of bladder (1995); Cardiovascular stress test (11/25/2013); transthoracic echocardiogram (12/31/2015   dr Caryl Comes); Cystoscopy with fulgeration (N/A, 06/30/2017); Spinal cord stimulator implant (10-09-2018   @  [facility]); and Cystoscopy with biopsy (N/A, 02/12/2019). Ms. Heiman has a current medication list which includes the following prescription(s): acetaminophen, amlodipine, gabapentin, losartan, uribel, metoprolol succinate, oxycodone-acetaminophen, pantoprazole, polyethylene glycol, promethazine, rosuvastatin, solifenacin, amitriptyline, phenazopyridine, and vitamin d (ergocalciferol), and the following Facility-Administered Medications: epirubicin (ELLENCE) 50 mg in sodium chloride 0.9 % bladder instillation and gemcitabine. She  reports that she has been smoking cigarettes. She has a 28.00 pack-year smoking history. She has never used smokeless tobacco. She reports that she does not drink alcohol or use drugs. Ms. Wenberg has No Known Allergies.   HPI  Today, she is being contacted for  ***   Pharmacotherapy Assessment  Analgesic: {There is no content from the last Subjective section.}   Monitoring: [place] PMP: PDMP not reviewed this encounter.       Pharmacotherapy: No side-effects or adverse reactions reported. Compliance: No problems identified. Effectiveness: Clinically acceptable. Plan: Refer to "POC".  UDS:  Summary  Date Value Ref Range Status  02/06/2018 FINAL  Final    Comment:    ==================================================================== TOXASSURE SELECT 13 (MW) ==================================================================== Test                             Result       Flag       Units Drug Present   Hydrocodone                    3616                    ng/mg creat   Hydromorphone                  1604                    ng/mg creat   Dihydrocodeine                 328                      ng/mg creat   Norhydrocodone                 5344                    ng/mg creat    Sources of hydrocodone include scheduled prescription    medications. Hydromorphone, dihydrocodeine and norhydrocodone are    expected metabolites of hydrocodone. Hydromorphone and    dihydrocodeine are also available as scheduled prescription    medications. ==================================================================== Test                      Result    Flag   Units      Ref Range   Creatinine              25               mg/dL      >=20 ==================================================================== Declared Medications:  Medication list was not provided. ==================================================================== For clinical consultation, please call [phone]. ====================================================================    Laboratory Chemistry Profile   Renal Lab Results  Component Value Date   BUN 9 06/17/2019   CREATININE 0.60 06/17/2019   BCR 18 04/28/2014   GFR 108.68 01/11/2019   GFRAA >60 06/17/2019   GFRNONAA >60 06/17/2019     Hepatic Lab Results  Component Value Date   AST 43 (H) 06/03/2019  ALT 41 06/03/2019   ALBUMIN 4.3 06/03/2019   ALKPHOS 77 06/03/2019   LIPASE 30 11/23/2013     Electrolytes Lab Results  Component Value Date   NA 135 06/17/2019   K 3.6 06/17/2019   CL 98 06/17/2019   CALCIUM 9.2 06/17/2019   MG 2.3 06/03/2019     Bone Lab Results  Component Value Date   VD25OH 36.17 01/11/2019     Inflammation (CRP: Acute Phase) (ESR: Chronic Phase) No results found for: CRP, ESRSEDRATE, LATICACIDVEN     Note: Above Lab results reviewed.  Imaging  DG Abd Portable 2 Views CLINICAL DATA:  Rectal pain for 1 month, constipation, history of bladder cancer  EXAM: PORTABLE ABDOMEN - 2 VIEW  COMPARISON:  05/24/2016  FINDINGS: 2 supine frontal views of the abdomen and pelvis are obtained. Multi lead AICD is  identified. Spinal stimulator generator overlies left mid abdomen, leads over midthoracic central canal. Bowel gas pattern is unremarkable. Minimal retained stool. No obstruction or ileus. No masses or abnormal calcifications.  Marked right convex scoliosis with multilevel spondylosis and facet hypertrophy. Bilateral hip osteoarthritis.  IMPRESSION: 1. Unremarkable bowel gas pattern.  Electronically Signed   By: Randa Ngo M.D.   On: 06/17/2019 16:31  Assessment  There were no encounter diagnoses.  Plan of Care  Problem-specific:  No problem-specific Assessment & Plan notes found for this encounter.  Ms. MATSUKO KRETZ has a current medication list which includes the following long-term medication(s): amlodipine, gabapentin, losartan, metoprolol succinate, pantoprazole, promethazine, rosuvastatin, and amitriptyline.  Pharmacotherapy (Medications Ordered): No orders of the defined types were placed in this encounter.  Orders:  No orders of the defined types were placed in this encounter.  Follow-up plan:   No follow-ups on file.      Recent Visits Date Type Provider Dept  02/07/19 Office Visit [name], MD [facility]  Showing recent visits within past 90 days and meeting all other requirements   Today's Visits Date Type Provider Dept  03/26/19 Office Visit [name], MD [facility]  Showing today's visits and meeting all other requirements   Future Appointments No visits were found meeting these conditions.  Showing future appointments within next 90 days and meeting all other requirements   I discussed the assessment and treatment plan with the patient. The patient was provided an opportunity to ask questions and all were answered. The patient agreed with the plan and demonstrated an understanding of the instructions.  Patient advised to call back or seek an in-person evaluation if the symptoms or condition worsens.  Total duration  of non-face-to-face encounter:30 minutes.  Note by: [name], MD Date: 03/26/2019; Time: 2:15 PM    Recent Visits Date Type Provider Dept  07/16/19 Telemedicine [name], MD [facility]  06/19/19 Office Visit [name], MD [facility]  Showing recent visits within past 90 days and meeting all other requirements   Today's Visits Date Type Provider Dept  08/13/19 Office Visit [name], MD [facility]  Showing today's visits and meeting all other requirements   Future Appointments No visits were found meeting these conditions.  Showing future appointments within next 90 days and meeting all other requirements   I discussed the assessment and treatment plan with the patient. The patient was provided an opportunity to ask questions and all were answered. The patient agreed with the plan and demonstrated an understanding of the instructions.  Patient advised to call back or seek an  in-person evaluation if the symptoms or condition worsens.  Duration of encounter: *** minutes.  Note by: [name], MD Date: 08/13/2019; Time: 2:26 PM

## 2019-08-13 NOTE — Progress Notes (Signed)
Nursing Pain Medication Assessment:  Safety precautions to be maintained throughout the outpatient stay will include: orient to surroundings, keep bed in low position, maintain call bell within reach at all times, provide assistance with transfer out of bed and ambulation.  Medication Inspection Compliance: Pill count conducted under aseptic conditions, in front of the patient. Neither the pills nor the bottle was removed from the patient's sight at any time. Once count was completed pills were immediately returned to the patient in their original bottle.  Medication: Oxycodone/APAP Pill/Patch Count: 25 of 150 pills remain Pill/Patch Appearance: Markings consistent with prescribed medication Bottle Appearance: Standard pharmacy container. Clearly labeled. Filled Date: 4 / 52 / 2021 Last Medication intake:  Today

## 2019-08-13 NOTE — Progress Notes (Signed)
PROVIDER NOTE: Information contained herein reflects review and annotations entered in association with encounter. Interpretation of such information and data should be left to medically-trained personnel. Information provided to patient can be located elsewhere in the medical record under "Patient Instructions". Document created using STT-dictation technology, any transcriptional errors that may result from process are unintentional.    Patient: [name]  Service Category: E/M  Provider: Bilal Lateef, MD  DOB: 05/16/1940  DOS: 08/13/2019  Referring Provider: Tullo, Teresa L, MD  MRN: [id number]  Setting: Ambulatory outpatient  PCP: Tullo, Teresa L, MD  Type: Established Patient  Specialty: Interventional Pain Management    Location: Office  Delivery: Face-to-face     Primary Reason(s) for Visit: Encounter for prescription drug management. (Level of risk: moderate)  CC: Back Pain (lower)  HPI  [name] is a 79 y.o. year old, female patient, who comes today for a medication management evaluation. She has Hyponatremia; Bladder cancer (HCC); LBBB (left bundle branch block); Atrial ectopy; Congestive dilated cardiomyopathy (HCC); History of gastric ulcer; Spinal stenosis of thoracolumbar region; COPD (chronic obstructive pulmonary disease) (HCC); Constipation; Spinal stenosis in cervical region; Chronic bilateral low back pain; NSAID induced gastritis; Parkinsonian features; Hypertension; Spinal stenosis, lumbar region, with neurogenic claudication; Other idiopathic scoliosis, lumbar region; Hyperlipidemia; S/P placement of cardiac pacemaker; Coronary artery disease; Lumbar spondylosis; SI joint arthritis; Lumbar degenerative disc disease; Chronic pain syndrome; Lumbar radiculopathy; Unintentional weight loss; PAD (peripheral artery disease) (HCC); Educated about COVID-19 virus infection; Osteoporosis; Lumbar facet arthropathy; 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 Her primarily concern today is the Back Pain (lower)  Pain Assessment: Location: Lower Back Radiating: buttocks bilateral around to front thighs and down to feet Onset: More than a month ago Duration: Chronic pain Quality: Aching, Burning, Discomfort Severity: 8 /10 (subjective, self-reported pain score)  Note: Reported level is compatible with observation.  Effect on ADL: prolonged walking and standing Timing: Constant Modifying factors: medications, resting BP: (!) 150/77  HR: 60  [name] was last scheduled for an appointment on 06/19/2019 for medication management. During today's appointment we reviewed [name]'s chronic pain status, as well as her outpatient medication regimen.  Patient continues to endorse persistent low back pain and buttock pain.  She has tried various opioid analgesics including oxycodone, hydrocodone, Percocet, Extensa neither of which have been very helpful.  She states that she receives the greatest pain relief which is moderate at best with oxycodone 10 mg up to 5 times a day.  Patient is requesting an increase in her oxycodone which I politely declined given risk of opioid-induced hyperalgesia and central sensitization syndrome.  She also has a spinal cord stimulator in place which she is utilizing.  Medication refill as below.  Will obtain UDS today as well.  The patient  reports no history of drug use. Her body mass index is 18.25 kg/m.  Further details on both, my assessment(s), as well as the proposed treatment plan, please see below.  Controlled Substance Pharmacotherapy Assessment REMS (Risk Evaluation and Mitigation Strategy)  Analgesic: 07/19/2019  1   07/16/2019  Oxycodone-Acetaminophen 10-325  150.00  30 Bi Lat   [id number]   [place] (7587)   0  75.00 MME  Comm Ins   [place]     Garner, Cynthia F, RN  08/13/2019  2:11 PM  Sign when Signing Visit Nursing Pain Medication Assessment:  Safety  precautions to be maintained throughout the outpatient stay will include:   orient to surroundings, keep bed in low position, maintain call bell within reach at all times, provide assistance with transfer out of bed and ambulation.  Medication Inspection Compliance: Pill count conducted under aseptic conditions, in front of the patient. Neither the pills nor the bottle was removed from the patient's sight at any time. Once count was completed pills were immediately returned to the patient in their original bottle.  Medication: Oxycodone/APAP Pill/Patch Count: 25 of 150 pills remain Pill/Patch Appearance: Markings consistent with prescribed medication Bottle Appearance: Standard pharmacy container. Clearly labeled. Filled Date: 4 / 30 / 2021 Last Medication intake:  Today  Pharmacokinetics: Liberation and absorption (onset of action): WNL Distribution (time to peak effect): WNL Metabolism and excretion (duration of action): WNL         Pharmacodynamics: Desired effects: Analgesia: [name] reports 50% benefit. Functional ability: Patient reports that medication allows her to accomplish basic ADLs Clinically meaningful improvement in function (CMIF): Sustained CMIF goals met Perceived effectiveness: Described as relatively effective, allowing for increase in activities of daily living (ADL) Undesirable effects: Side-effects or Adverse reactions: None reported Monitoring: [place] PMP: PDMP not reviewed this encounter. Online review of the past 12-month period conducted. Compliant with practice rules and regulations Last UDS on record: Summary  Date Value Ref Range Status  02/06/2018 FINAL  Final    Comment:    ==================================================================== TOXASSURE SELECT 13 (MW) ==================================================================== Test                             Result       Flag       Units Drug Present   Hydrocodone                    3616                     ng/mg creat   Hydromorphone                  1604                    ng/mg creat   Dihydrocodeine                 328                     ng/mg creat   Norhydrocodone                 5344                    ng/mg creat    Sources of hydrocodone include scheduled prescription    medications. Hydromorphone, dihydrocodeine and norhydrocodone are    expected metabolites of hydrocodone. Hydromorphone and    dihydrocodeine are also available as scheduled prescription    medications. ==================================================================== Test                      Result    Flag   Units      Ref Range   Creatinine              25               mg/dL      >=20 ==================================================================== Declared Medications:  Medication list was not provided. ==================================================================== For clinical consultation, please call [phone]. ====================================================================    UDS interpretation: Compliant            Medication Assessment Form: Reviewed. Patient indicates being compliant with therapy Treatment compliance: Compliant Risk Assessment Profile: Aberrant behavior: See initial evaluations. None observed or detected today Comorbid factors increasing risk of overdose: See initial evaluation. No additional risks detected today Opioid risk tool (ORT):  Opioid Risk  05/08/2018  Alcohol 0  Illegal Drugs 0  Rx Drugs 0  Alcohol 0  Illegal Drugs 0  Rx Drugs 0  Age between 16-45 years  0  History of Preadolescent Sexual Abuse 0  Psychological Disease 0  Depression 0  Opioid Risk Tool Scoring 0  Opioid Risk Interpretation Low Risk    ORT Scoring interpretation table:  Score <3 = Low Risk for SUD  Score between 4-7 = Moderate Risk for SUD  Score >8 = High Risk for Opioid Abuse   Risk of substance use disorder (SUD): Low  Risk Mitigation Strategies:  Patient  Counseling: Covered Patient-Prescriber Agreement (PPA): Present and active  Notification to other healthcare providers: Done  Pharmacologic Plan: No change in therapy, at this time.             Laboratory Chemistry Profile   Renal Lab Results  Component Value Date   BUN 9 06/17/2019   CREATININE 0.60 06/17/2019   BCR 18 04/28/2014   GFR 108.68 01/11/2019   GFRAA >60 06/17/2019   GFRNONAA >60 06/17/2019   SPECGRAV <=1.005 05/24/2016   PHUR 5.5 05/24/2016   PROTEINUR 30 (A) 06/17/2019     Electrolytes Lab Results  Component Value Date   NA 135 06/17/2019   K 3.6 06/17/2019   CL 98 06/17/2019   CALCIUM 9.2 06/17/2019   MG 2.3 06/03/2019     Hepatic Lab Results  Component Value Date   AST 43 (H) 06/03/2019   ALT 41 06/03/2019   ALBUMIN 4.3 06/03/2019   ALKPHOS 77 06/03/2019   LIPASE 30 11/23/2013     ID Lab Results  Component Value Date   SARSCOV2NAA Not Detected 04/03/2019   STAPHAUREUS NEGATIVE 05/07/2014   MRSAPCR NEGATIVE 05/07/2014     Bone Lab Results  Component Value Date   VD25OH 36.17 01/11/2019     Endocrine Lab Results  Component Value Date   GLUCOSE 84 06/17/2019   GLUCOSEU NEGATIVE 06/17/2019   HGBA1C 4.6 12/27/2013   TSH 0.67 10/10/2016   FREET4 1.48 11/27/2013     Neuropathy Lab Results  Component Value Date   VITAMINB12 1,069 (H) 12/27/2013   HGBA1C 4.6 12/27/2013     CNS No results found for: COLORCSF, APPEARCSF, RBCCOUNTCSF, WBCCSF, POLYSCSF, LYMPHSCSF, EOSCSF, PROTEINCSF, GLUCCSF, JCVIRUS, CSFOLI, IGGCSF, LABACHR, ACETBL, LABACHR, ACETBL   Inflammation (CRP: Acute  ESR: Chronic) No results found for: CRP, ESRSEDRATE, LATICACIDVEN   Rheumatology No results found for: RF, ANA, LABURIC, URICUR, LYMEIGGIGMAB, LYMEABIGMQN, HLAB27   Coagulation Lab Results  Component Value Date   INR 1.0 04/28/2014   LABPROT 10.3 04/28/2014   APTT 27 11/22/2013   PLT 162 06/17/2019     Cardiovascular Lab Results  Component Value Date    CKTOTAL 87 09/01/2010   CKMB 4.1 (H) 09/01/2010   TROPONINI <0.30 11/23/2013   HGB 13.8 06/17/2019   HCT 41.8 06/17/2019     Screening Lab Results  Component Value Date   SARSCOV2NAA Not Detected 04/03/2019   STAPHAUREUS NEGATIVE 05/07/2014   MRSAPCR NEGATIVE 05/07/2014     Cancer No results found for: CEA, CA125, LABCA2   Allergens No results found for: ALMOND, APPLE, ASPARAGUS, AVOCADO, BANANA, BARLEY, BASIL, BAYLEAF, GREENBEAN, Gore, WHITEBEAN,  BEEFIGE, REDBEET, BLUEBERRY, BROCCOLI, CABBAGE, MELON, CARROT, CASEIN, CASHEWNUT, CAULIFLOWER, CELERY     Note: Lab results reviewed.   Recent Diagnostic Imaging Results  DG Abd Portable 2 Views CLINICAL DATA:  Rectal pain for 1 month, constipation, history of bladder cancer  EXAM: PORTABLE ABDOMEN - 2 VIEW  COMPARISON:  05/24/2016  FINDINGS: 2 supine frontal views of the abdomen and pelvis are obtained. Multi lead AICD is identified. Spinal stimulator generator overlies left mid abdomen, leads over midthoracic central canal. Bowel gas pattern is unremarkable. Minimal retained stool. No obstruction or ileus. No masses or abnormal calcifications.  Marked right convex scoliosis with multilevel spondylosis and facet hypertrophy. Bilateral hip osteoarthritis.  IMPRESSION: 1. Unremarkable bowel gas pattern.  Electronically Signed   By: Randa Ngo M.D.   On: 06/17/2019 16:31  Complexity Note: Imaging results reviewed. Results shared with Ms. Dant, using Layman's terms.                               Meds   Current Outpatient Medications:  .  acetaminophen (TYLENOL) 500 MG tablet, Take 500 mg by mouth every 8 (eight) hours as needed., Disp: , Rfl:  .  amLODipine (NORVASC) 2.5 MG tablet, Take 1 tablet (2.5 mg total) by mouth daily., Disp: 90 tablet, Rfl: 3 .  gabapentin (NEURONTIN) 300 MG capsule, Three times daily as needed for back pain, Disp: 270 capsule, Rfl: 1 .  losartan (COZAAR) 100 MG tablet, Take 1 tablet  (100 mg total) by mouth at bedtime., Disp: 90 tablet, Rfl: 0 .  Meth-Hyo-M Bl-Na Phos-Ph Sal (URIBEL) 118 MG CAPS, TK 1 C PO Q 6 H PRN, Disp: , Rfl:  .  metoprolol succinate (TOPROL-XL) 25 MG 24 hr tablet, Take 1 tablet (25 mg total) by mouth daily., Disp: 90 tablet, Rfl: 3 .  [START ON 08/17/2019] oxyCODONE-acetaminophen (PERCOCET) 10-325 MG tablet, Take 1 tablet by mouth every 4 (four) hours as needed for pain. Must last 30 days., Disp: 150 tablet, Rfl: 0 .  [START ON 09/16/2019] oxyCODONE-acetaminophen (PERCOCET) 10-325 MG tablet, Take 1 tablet by mouth every 4 (four) hours as needed for pain. Must last 30 days., Disp: 150 tablet, Rfl: 0 .  [START ON 10/16/2019] oxyCODONE-acetaminophen (PERCOCET) 10-325 MG tablet, Take 1 tablet by mouth every 4 (four) hours as needed for pain. Must last 30 days., Disp: 150 tablet, Rfl: 0 .  pantoprazole (PROTONIX) 40 MG tablet, Take 1 tablet (40 mg total) by mouth daily., Disp: 30 tablet, Rfl: 3 .  polyethylene glycol (MIRALAX / GLYCOLAX) packet, Take 17 g by mouth daily as needed for mild constipation. , Disp: , Rfl:  .  promethazine (PHENERGAN) 12.5 MG tablet, Take 1 tablet (12.5 mg total) by mouth every 8 (eight) hours as needed for nausea or vomiting., Disp: 30 tablet, Rfl: 2 .  rosuvastatin (CRESTOR) 5 MG tablet, Take 1 tablet (5 mg total) by mouth daily., Disp: 90 tablet, Rfl: 0 .  solifenacin (VESICARE) 5 MG tablet, Take 5 mg by mouth daily., Disp: , Rfl:  .  amitriptyline (ELAVIL) 10 MG tablet, Take 1 tablet (10 mg total) by mouth at bedtime., Disp: 30 tablet, Rfl: 2 .  phenazopyridine (PYRIDIUM) 200 MG tablet, Take 200 mg by mouth 3 (three) times daily as needed., Disp: , Rfl:  .  Vitamin D, Ergocalciferol, (DRISDOL) 50000 units CAPS capsule, TK ONE C PO Q WEEK FOR 8 WEEKS, Disp: , Rfl: 0 No current facility-administered  medications for this visit.  Facility-Administered Medications Ordered in Other Visits:  .  epirubicin (ELLENCE) 50 mg in sodium chloride  0.9 % bladder instillation, 50 mg, Bladder Instillation, Once, [name], MD .  gemcitabine [facility]) chemo syringe for bladder instillation 2,000 mg, 2,000 mg, Bladder Instillation, Once, [name], MD  ROS  Constitutional: Denies any fever or chills Gastrointestinal: No reported hemesis, hematochezia, vomiting, or acute GI distress Musculoskeletal: Denies any acute onset joint swelling, redness, loss of ROM, or weakness Neurological: No reported episodes of acute onset apraxia, aphasia, dysarthria, agnosia, amnesia, paralysis, loss of coordination, or loss of consciousness  Allergies  Ms. Kinder has No Known Allergies.  [place]  Drug: Ms. Peffley  reports no history of drug use. Alcohol:  reports no history of alcohol use. Tobacco:  reports that she has been smoking cigarettes. She has a 28.00 pack-year smoking history. She has never used smokeless tobacco. Medical:  has a past medical history of AICD (automatic cardioverter/defibrillator) present ([place] cardiologist--- dr Caryl Comes ), Arthritis, Bladder cancer [place]) (urologist-  dr Junious Silk), Chronic hyponatremia, Chronic low back pain with bilateral sciatica, Chronic systolic (congestive) heart failure ([place]), COPD with emphysema ([place]), Coronary artery disease (cardiologist-  dr Kathlyn [place]), DDD (degenerative disc disease), thoracolumbar, Frequent urination, Full dentures, Gait instability, GERD (gastroesophageal reflux disease), History of iron deficiency anemia (11/2013), History of stomach ulcers (11/2013), Hypertension, LBBB (left bundle branch block), NICM (nonischemic cardiomyopathy) ([place]) (last echo 12-31-2015 ef 55-60%), Nocturia more than twice per night, S/P insertion of spinal cord stimulator (followed [facility]---- Dr Pecola Leisure (notes in care everywhere)), and Scoliosis. Surgical: Ms. Manner  has a past surgical history that includes Esophagogastroduodenoscopy (N/A, 11/28/2013); Transurethral resection of bladder tumor (N/A,  12/13/2013); Transurethral resection of bladder tumor (N/A, 01/17/2014); Cataract extraction w/ intraocular lens  implant, bilateral (Bilateral, right 12-2013 / left  02-2014); Cystoscopy with retrograde pyelogram, ureteroscopy and stent placement (Bilateral, 04/18/2014); Transurethral resection of bladder tumor (N/A, 04/18/2014); Lumbar disc surgery (1980's); bi-ventricular implantable cardioverter defibrillator (N/A, 05/07/2014); Cystoscopy w/ retrogrades (Bilateral, 01/06/2015); Cystoscopy with biopsy (N/A, 10/06/2015); Cardiac catheterization (03-03-2014  dr Kathlyn [place]   [facility]); Transurethral resection of bladder (1995); Cardiovascular stress test (11/25/2013); transthoracic echocardiogram (12/31/2015   dr Caryl Comes); Cystoscopy with fulgeration (N/A, 06/30/2017); Spinal cord stimulator implant (10-09-2018   _0 ); and Cystoscopy with biopsy (N/A, 02/12/2019). Family: family history includes Cancer in her brother and mother.  Constitutional Exam  General appearance: Well nourished, well developed, and well hydrated. In no apparent acute distress Vitals:   08/13/19 1400  BP: (!) 150/77  Pulse: 60  Resp: 16  Temp: (!) 96.6 F (35.9 C)  SpO2: 97%  Weight: 120 lb (54.4 kg)  Height: 5' 8" (1.727 m)   BMI Assessment: Estimated body mass index is 18.25 kg/m as calculated from the following:   Height as of this encounter: 5' 8" (1.727 m).   Weight as of this encounter: 120 lb (54.4 kg).  BMI interpretation table: BMI level Category Range association with higher incidence of chronic pain  <18 kg/m2 Underweight   18.5-24.9 kg/m2 Ideal body weight   25-29.9 kg/m2 Overweight Increased incidence by 20%  30-34.9 kg/m2 Obese (Class I) Increased incidence by 68%  35-39.9 kg/m2 Severe obesity (Class II) Increased incidence by 136%  >40 kg/m2 Extreme obesity (Class III) Increased incidence by 254%   Patient's current BMI Ideal Body weight  Body mass index is 18.25 kg/m. Ideal body weight: 63.9 kg (140 lb  14 oz)   BMI  Readings from Last 4 Encounters:  08/13/19 18.25 kg/m  07/15/19 18.25 kg/m  06/20/19 18.25 kg/m  06/19/19 18.25 kg/m   Wt Readings from Last 4 Encounters:  08/13/19 120 lb (54.4 kg)  07/15/19 120 lb (54.4 kg)  06/20/19 120 lb (54.4 kg)  06/19/19 120 lb (54.4 kg)    Psych/Mental status: Alert, oriented x 3 (person, place, & time)       Eyes: PERLA Respiratory: No evidence of acute respiratory distress  Cervical Spine Exam  Skin & Axial Inspection: No masses, redness, edema, swelling, or associated skin lesions Alignment: Symmetrical Functional ROM: Unrestricted ROM      Stability: No instability detected Muscle Tone/Strength: Functionally intact. No obvious neuro-muscular anomalies detected. Sensory (Neurological): Unimpaired Palpation: No palpable anomalies              Upper Extremity (UE) Exam    Side: Right upper extremity  Side: Left upper extremity  Skin & Extremity Inspection: Skin color, temperature, and hair growth are WNL. No peripheral edema or cyanosis. No masses, redness, swelling, asymmetry, or associated skin lesions. No contractures.  Skin & Extremity Inspection: Skin color, temperature, and hair growth are WNL. No peripheral edema or cyanosis. No masses, redness, swelling, asymmetry, or associated skin lesions. No contractures.  Functional ROM: Unrestricted ROM          Functional ROM: Unrestricted ROM          Muscle Tone/Strength: Functionally intact. No obvious neuro-muscular anomalies detected.  Muscle Tone/Strength: Functionally intact. No obvious neuro-muscular anomalies detected.  Sensory (Neurological): Unimpaired          Sensory (Neurological): Unimpaired          Palpation: No palpable anomalies              Palpation: No palpable anomalies              Provocative Test(s):  Phalen's test: deferred Tinel's test: deferred Apley's scratch test (touch opposite shoulder):  Action 1 (Across chest): deferred Action 2 (Overhead):  deferred Action 3 (LB reach): deferred   Provocative Test(s):  Phalen's test: deferred Tinel's test: deferred Apley's scratch test (touch opposite shoulder):  Action 1 (Across chest): deferred Action 2 (Overhead): deferred Action 3 (LB reach): deferred    Thoracic Spine Area Exam  Skin & Axial Inspection: No masses, redness, or swelling Alignment: Symmetrical Functional ROM: Unrestricted ROM Stability: No instability detected Muscle Tone/Strength: Functionally intact. No obvious neuro-muscular anomalies detected. Sensory (Neurological): Unimpaired Muscle strength & Tone: No palpable anomalies  Lumbar Exam  Skin & Axial Inspection: Well healed scar from previous spine surgery detected Alignment: Symmetrical Functional ROM: Pain restricted ROM       Stability: No instability detected Muscle Tone/Strength: Functionally intact. No obvious neuro-muscular anomalies detected. Sensory (Neurological): Dermatomal pain pattern Palpation: No palpable anomalies         Gait & Posture Assessment  Ambulation: Patient ambulates using a walker Gait: Limited. Using assistive device to ambulate Posture: Difficulty standing up straight, due to pain   Lower Extremity Exam    Side: Right lower extremity  Side: Left lower extremity  Stability: No instability observed          Stability: No instability observed          Skin & Extremity Inspection: Skin color, temperature, and hair growth are WNL. No peripheral edema or cyanosis. No masses, redness, swelling, asymmetry, or associated skin lesions. No contractures.  Skin & Extremity Inspection: Skin color, temperature, and hair growth are   WNL. No peripheral edema or cyanosis. No masses, redness, swelling, asymmetry, or associated skin lesions. No contractures.  Functional ROM: Pain restricted ROM for all joints of the lower extremity          Functional ROM: Pain restricted ROM for all joints of the lower extremity          Muscle Tone/Strength:  Functionally intact. No obvious neuro-muscular anomalies detected.  Muscle Tone/Strength: Functionally intact. No obvious neuro-muscular anomalies detected.  Sensory (Neurological): Dermatomal pain pattern        Sensory (Neurological): Dermatomal pain pattern        DTR: Patellar: deferred today Achilles: deferred today Plantar: deferred today  DTR: Patellar: deferred today Achilles: deferred today Plantar: deferred today  Palpation: No palpable anomalies  Palpation: No palpable anomalies   Assessment   Status Diagnosis  Persistent Persistent Persistent 1. Lumbar radiculopathy   2. Chronic pain syndrome   3. Lumbar degenerative disc disease   4. Spinal stenosis, lumbar region, with neurogenic claudication   5. Lumbar facet arthropathy   6. Lumbar spondylosis      Plan of Care  Pharmacotherapy (Medications Ordered): Meds ordered this encounter  Medications  . oxyCODONE-acetaminophen (PERCOCET) 10-325 MG tablet    Sig: Take 1 tablet by mouth every 4 (four) hours as needed for pain. Must last 30 days.    Dispense:  150 tablet    Refill:  0    Chronic Pain. (STOP Act - Not applicable). Fill one day early if closed on scheduled refill date.  Max 5 tablets a daily, 150/month.  . oxyCODONE-acetaminophen (PERCOCET) 10-325 MG tablet    Sig: Take 1 tablet by mouth every 4 (four) hours as needed for pain. Must last 30 days.    Dispense:  150 tablet    Refill:  0    Chronic Pain. (STOP Act - Not applicable). Fill one day early if closed on scheduled refill date.  Max 5 tablets a daily, 150/month.  . oxyCODONE-acetaminophen (PERCOCET) 10-325 MG tablet    Sig: Take 1 tablet by mouth every 4 (four) hours as needed for pain. Must last 30 days.    Dispense:  150 tablet    Refill:  0    Chronic Pain. (STOP Act - Not applicable). Fill one day early if closed on scheduled refill date.  Max 5 tablets a daily, 150/month.  . amitriptyline (ELAVIL) 10 MG tablet    Sig: Take 1 tablet (10 mg  total) by mouth at bedtime.    Dispense:  30 tablet    Refill:  2   Medications administered today: Jernie C. Kelly had no medications administered during this visit.  Orders:  Orders Placed This Encounter  Procedures  . ToxASSURE Select 13 (MW), Urine    Volume: 30 ml(s). Minimum 3 ml of urine is needed. Document temperature of fresh sample. Indications: Long term (current) use of opiate analgesic (Z79.891)    Lab Orders     ToxASSURE Select 13 (MW), Urine Planned follow-up:   Return in about 3 months (around 11/13/2019) for Medication Management, in person.   Recent Visits Date Type Provider Dept  07/16/19 Telemedicine Lateef, Bilal, MD [facility]  06/19/19 Office Visit Lateef, Bilal, MD [facility]  Showing recent visits within past 90 days and meeting all other requirements   Today's Visits Date Type Provider Dept  08/13/19 Office Visit Lateef, Bilal, MD [facility]  Showing today's visits and meeting all other requirements   Future Appointments   No visits were found meeting these conditions.  Showing future appointments within next 90 days and meeting all other requirements   Primary Care Physician: [name], MD Location: [facility] Note by: [name], MD Date: 08/13/2019; Time: 2:47 PM  Note: This dictation was prepared with Dragon dictation. Any transcriptional errors that may result from this process are unintentional.

## 2019-08-15 ENCOUNTER — Telehealth: Payer: Medicare Other | Admitting: Internal Medicine

## 2019-08-16 LAB — TOXASSURE SELECT 13 (MW), URINE

## 2019-08-20 DIAGNOSIS — Z8551 Personal history of malignant neoplasm of bladder: Secondary | ICD-10-CM | POA: Diagnosis not present

## 2019-08-27 ENCOUNTER — Encounter: Payer: Self-pay | Admitting: Internal Medicine

## 2019-08-27 ENCOUNTER — Ambulatory Visit (INDEPENDENT_AMBULATORY_CARE_PROVIDER_SITE_OTHER): Payer: Medicare Other | Admitting: Internal Medicine

## 2019-08-27 ENCOUNTER — Other Ambulatory Visit: Payer: Self-pay

## 2019-08-27 VITALS — BP 132/88 | HR 48 | Temp 97.1°F | Resp 16 | Ht 68.0 in | Wt 122.2 lb

## 2019-08-27 DIAGNOSIS — K2950 Unspecified chronic gastritis without bleeding: Secondary | ICD-10-CM

## 2019-08-27 DIAGNOSIS — K2970 Gastritis, unspecified, without bleeding: Secondary | ICD-10-CM | POA: Insufficient documentation

## 2019-08-27 DIAGNOSIS — H6011 Cellulitis of right external ear: Secondary | ICD-10-CM | POA: Diagnosis not present

## 2019-08-27 DIAGNOSIS — E7800 Pure hypercholesterolemia, unspecified: Secondary | ICD-10-CM | POA: Diagnosis not present

## 2019-08-27 LAB — COMPREHENSIVE METABOLIC PANEL
ALT: 32 U/L (ref 0–35)
AST: 38 U/L — ABNORMAL HIGH (ref 0–37)
Albumin: 4.5 g/dL (ref 3.5–5.2)
Alkaline Phosphatase: 83 U/L (ref 39–117)
BUN: 13 mg/dL (ref 6–23)
CO2: 32 mEq/L (ref 19–32)
Calcium: 9.8 mg/dL (ref 8.4–10.5)
Chloride: 96 mEq/L (ref 96–112)
Creatinine, Ser: 0.64 mg/dL (ref 0.40–1.20)
GFR: 89.19 mL/min (ref >60.00)
Glucose, Bld: 92 mg/dL (ref 70–99)
Potassium: 4.7 mEq/L (ref 3.5–5.1)
Sodium: 130 mEq/L — ABNORMAL LOW (ref 135–145)
Total Bilirubin: 0.3 mg/dL (ref 0.2–1.2)
Total Protein: 6.7 g/dL (ref 6.0–8.3)

## 2019-08-27 MED ORDER — SUCRALFATE 1 G PO TABS
ORAL_TABLET | ORAL | 1 refills | Status: DC
Start: 2019-08-27 — End: 2020-03-11

## 2019-08-27 NOTE — Assessment & Plan Note (Signed)
Vs squamous cell CA.  Has had multiple rounds of abx since February without resolution.  Refer to ENT for evaluation

## 2019-08-27 NOTE — Assessment & Plan Note (Signed)
Managed with Crestor .  Takes intermittently.  LFTS needed   Lab Results  Component Value Date   CHOL 120 01/11/2019   HDL 56.90 01/11/2019   LDLCALC 48 01/11/2019   TRIG 76.0 01/11/2019   CHOLHDL 2 01/11/2019

## 2019-08-27 NOTE — Assessment & Plan Note (Signed)
Resume protonix and tid carafate.  Reviewed effects of carafate on absorption of other medications and timeline of administration of all meds written out for patient.  Patient instructed to  Notify MD if symptoms do not improve in 3-4 weeks so she can be referred to GI

## 2019-08-27 NOTE — Progress Notes (Signed)
Subjective:  Patient ID: [name], female    DOB: 1941/02/16  Age: 79 y.o. MRN: [id number]  CC: The primary encounter diagnosis was Cellulitis of concha of right ear. Diagnoses of Pure hypercholesterolemia and Chronic gastritis without bleeding, unspecified gastritis type were also pertinent to this visit.  HPI [name] presents for right ear problem  This visit occurred during the SARS-CoV-2 public health emergency.  Safety protocols were in place, including screening questions prior to the visit, additional usage of staff PPE, and extensive cleaning of exam room while observing appropriate contact time as indicated for disinfecting solutions.    Patient has received NO  doses of the available COVID 19 vaccines.   Patient continues to mask when outside of the home except when walking in yard or at safe distances from others .  Patient denies any change in mood or development of unhealthy behaviors resuting from the pandemic's restriction of activities and socialization.    Has had a nonhealing scaling spot in the Right ear accompanied by scant drainage and redness  Since January,  Has been using neosporin and has been on abx for recurrent  UTI ; including cefpodoxim as recently as early March  with no resolution.  Was then treated for cellulitis of right ear by Urgent Care on march 27 with cephalexin .  Appetite unpredictable : chronic,  Treatment for bladder cancer suspended currently because the CA is in remissoin  Still bothered by back pain .  Managed with oxycodone  Every 4 hours by pain clinic  Gastritis/ GERD symptoms have returned . Denies dark stools, nausea .  Wants to resume carafate,  Has already resumed protonix.   Pain when lying down but not otherwise   Outpatient Medications Prior to Visit  Medication Sig Dispense Refill  . acetaminophen (TYLENOL) 500 MG tablet Take 500 mg by mouth every 8 (eight) hours as needed.    Marland Kitchen amitriptyline (ELAVIL) 10 MG tablet Take 1  tablet (10 mg total) by mouth at bedtime. 30 tablet 2  . amLODipine (NORVASC) 2.5 MG tablet Take 1 tablet (2.5 mg total) by mouth daily. 90 tablet 3  . gabapentin (NEURONTIN) 300 MG capsule Three times daily as needed for back pain 270 capsule 1  . losartan (COZAAR) 100 MG tablet Take 1 tablet (100 mg total) by mouth at bedtime. 90 tablet 0  . Meth-Hyo-M Bl-Na Phos-Ph Sal (URIBEL) 118 MG CAPS TK 1 C PO Q 6 H PRN    . metoprolol succinate (TOPROL-XL) 25 MG 24 hr tablet Take 1 tablet (25 mg total) by mouth daily. 90 tablet 3  . oxyCODONE-acetaminophen (PERCOCET) 10-325 MG tablet Take 1 tablet by mouth every 4 (four) hours as needed for pain. Must last 30 days. 150 tablet 0  . [START ON 09/16/2019] oxyCODONE-acetaminophen (PERCOCET) 10-325 MG tablet Take 1 tablet by mouth every 4 (four) hours as needed for pain. Must last 30 days. 150 tablet 0  . [START ON 10/16/2019] oxyCODONE-acetaminophen (PERCOCET) 10-325 MG tablet Take 1 tablet by mouth every 4 (four) hours as needed for pain. Must last 30 days. 150 tablet 0  . pantoprazole (PROTONIX) 40 MG tablet Take 1 tablet (40 mg total) by mouth daily. 30 tablet 3  . phenazopyridine (PYRIDIUM) 200 MG tablet Take 200 mg by mouth 3 (three) times daily as needed.    . polyethylene glycol (MIRALAX / GLYCOLAX) packet Take 17 g by mouth daily as needed for mild constipation.     . promethazine (  PHENERGAN) 12.5 MG tablet Take 1 tablet (12.5 mg total) by mouth every 8 (eight) hours as needed for nausea or vomiting. 30 tablet 2  . rosuvastatin (CRESTOR) 5 MG tablet Take 1 tablet (5 mg total) by mouth daily. 90 tablet 0  . solifenacin (VESICARE) 5 MG tablet Take 5 mg by mouth daily.    . Vitamin D, Ergocalciferol, (DRISDOL) 50000 units CAPS capsule TK ONE C PO Q WEEK FOR 8 WEEKS  0   Facility-Administered Medications Prior to Visit  Medication Dose Route Frequency Provider Last Rate Last Admin  . epirubicin (ELLENCE) 50 mg in sodium chloride 0.9 % bladder instillation   50 mg Bladder Instillation Once [name], MD      . gemcitabine [facility]) chemo syringe for bladder instillation 2,000 mg  2,000 mg Bladder Instillation Once [name], MD        Review of Systems;  Patient denies headache, fevers, malaise, unintentional weight loss, skin rash, eye pain, sinus congestion and sinus pain, sore throat, dysphagia,  hemoptysis , cough, dyspnea, wheezing, chest pain, palpitations, orthopnea, edema, abdominal pain, nausea, melena, diarrhea, constipation, flank pain, dysuria, hematuria, urinary  Frequency, nocturia, numbness, tingling, seizures,  Focal weakness, Loss of consciousness,  Tremor, insomnia, depression, anxiety, and suicidal ideation.      Objective:  BP 132/88 (BP Location: Left Arm, Patient Position: Sitting, Cuff Size: Normal)   Pulse (!) 48   Temp (!) 97.1 F (36.2 C) (Temporal)   Resp 16   Ht 5\' 8"  (1.727 m)   Wt 122 lb 3.2 oz (55.4 kg)   SpO2 94%   BMI 18.58 kg/m   BP Readings from Last 3 Encounters:  08/27/19 132/88  08/13/19 (!) 150/77  07/15/19 (!) 144/83    Wt Readings from Last 3 Encounters:  08/27/19 122 lb 3.2 oz (55.4 kg)  08/13/19 120 lb (54.4 kg)  07/15/19 120 lb (54.4 kg)    General appearance: alert, cooperative and appears stated age Ears: normal TM's bilaterally.  Right concha with mild and external ear canals both ears Throat: lips, mucosa, and tongue normal; teeth and gums normal Neck: no adenopathy, no carotid bruit, supple, symmetrical, trachea midline and thyroid not enlarged, symmetric, no tenderness/mass/nodules Back: symmetric, no curvature. ROM normal. No CVA tenderness. Lungs: clear to auscultation bilaterally Heart: regular rate and rhythm, S1, S2 normal, no murmur, click, rub or gallop Abdomen: soft, mild epigastric  tender without rebound or guarding . bowel sounds normal; no masses,  no organomegaly Pulses: 2+ and symmetric Skin: Skin color, texture, turgor normal. No rashes or  lesions Lymph nodes: Cervical, supraclavicular, and axillary nodes normal.  Lab Results  Component Value Date   HGBA1C 4.6 12/27/2013   HGBA1C 5.7 (H) 08/31/2010    Lab Results  Component Value Date   CREATININE 0.60 06/17/2019   CREATININE 0.72 06/06/2019   CREATININE 0.81 06/03/2019    Lab Results  Component Value Date   WBC 7.6 06/17/2019   HGB 13.8 06/17/2019   HCT 41.8 06/17/2019   PLT 162 06/17/2019   GLUCOSE 84 06/17/2019   CHOL 120 01/11/2019   TRIG 76.0 01/11/2019   HDL 56.90 01/11/2019   LDLCALC 48 01/11/2019   ALT 41 06/03/2019   AST 43 (H) 06/03/2019   NA 135 06/17/2019   K 3.6 06/17/2019   CL 98 06/17/2019   CREATININE 0.60 06/17/2019   BUN 9 06/17/2019   CO2 27 06/17/2019   TSH 0.67 10/10/2016   INR 1.0 04/28/2014   HGBA1C  4.6 12/27/2013    DG Abd Portable 2 Views  Result Date: 06/17/2019 CLINICAL DATA:  Rectal pain for 1 month, constipation, history of bladder cancer EXAM: PORTABLE ABDOMEN - 2 VIEW COMPARISON:  05/24/2016 FINDINGS: 2 supine frontal views of the abdomen and pelvis are obtained. Multi lead AICD is identified. Spinal stimulator generator overlies left mid abdomen, leads over midthoracic central canal. Bowel gas pattern is unremarkable. Minimal retained stool. No obstruction or ileus. No masses or abnormal calcifications. Marked right convex scoliosis with multilevel spondylosis and facet hypertrophy. Bilateral hip osteoarthritis. IMPRESSION: 1. Unremarkable bowel gas pattern. Electronically Signed   By: Randa Ngo M.D.   On: 06/17/2019 16:31    Assessment & Plan:   Problem List Items Addressed This Visit      Unprioritized   Cellulitis of concha of right ear - Primary    Vs squamous cell CA.  Has had multiple rounds of abx since February without resolution.  Refer to ENT for evaluation       Relevant Orders   Ambulatory referral to ENT   Gastritis    Resume protonix and tid carafate.  Reviewed effects of carafate on absorption  of other medications and timeline of administration of all meds written out for patient.  Patient instructed to  Notify MD if symptoms do not improve in 3-4 weeks so she can be referred to GI      Hyperlipidemia    Managed with Crestor .  Takes intermittently.  LFTS needed   Lab Results  Component Value Date   CHOL 120 01/11/2019   HDL 56.90 01/11/2019   LDLCALC 48 01/11/2019   TRIG 76.0 01/11/2019   CHOLHDL 2 01/11/2019         Relevant Orders   Comprehensive metabolic panel      I provided  30 minutes of  face-to-face time during this encounter reviewing patient's current problems and past surgeries, labs and imaging studies, providing counseling on the above mentioned problems , and coordination  of care . I am having Emmalin C. Vanwinkle start on sucralfate. I am also having her maintain her polyethylene glycol, Vitamin D (Ergocalciferol), acetaminophen, solifenacin, Uribel, amLODipine, metoprolol succinate, gabapentin, phenazopyridine, promethazine, rosuvastatin, losartan, pantoprazole, oxyCODONE-acetaminophen, oxyCODONE-acetaminophen, oxyCODONE-acetaminophen, and amitriptyline.  Meds ordered this encounter  Medications  . sucralfate (CARAFATE) 1 g tablet    Sig: Take one tablet one hour before each meal and 2 hours AFTER other medications    Dispense:  90 tablet    Refill:  1    There are no discontinued medications.  Follow-up: No follow-ups on file.   [name], MD

## 2019-08-27 NOTE — Patient Instructions (Signed)
Referral to ENT for the ear problem. You man need a biopsy  Take the pantoprazole, metoprolol, and amlodipine in the morning on an empty stomach  Take the carafate 1 hour before  Each meal .  Make sure your other medications are taken either  2 hours before or 2 hours after the carafate  to avoid interfering with their absorption  Sucralfate tablets What is this medicine? SUCRALFATE (SOO kral fate) helps to treat ulcers of the intestine. This medicine may be used for other purposes; ask your health care provider or pharmacist if you have questions. COMMON BRAND NAME(S): Carafate What should I tell my health care provider before I take this medicine? They need to know if you have any of these conditions:  kidney disease  an unusual or allergic reaction to sucralfate, other medicines, foods, dyes, or preservatives  pregnant or trying to get pregnant  breast-feeding How should I use this medicine? Take this medicine by mouth with a glass of water. Follow the directions on the prescription label. This medicine works best if you take it on an empty stomach, 1 hour before meals. Take your doses at regular intervals. Do not take your medicine more often than directed. Do not stop taking except on your doctor's advice. Talk to your pediatrician regarding the use of this medicine in children. Special care may be needed. Overdosage: If you think you have taken too much of this medicine contact a poison control center or emergency room at once. NOTE: This medicine is only for you. Do not share this medicine with others. What if I miss a dose? If you miss a dose, take it as soon as you can. If it is almost time for your next dose, take only that dose. Do not take double or extra doses. What may interact with this medicine?  antacid  cimetidine  digoxin  ketoconazole  phenytoin  quinidine  ranitidine  some antibiotics like ciprofloxacin, norfloxacin, and  ofloxacin  theophylline  thyroid hormones  warfarin This list may not describe all possible interactions. Give your health care provider a list of all the medicines, herbs, non-prescription drugs, or dietary supplements you use. Also tell them if you smoke, drink alcohol, or use illegal drugs. Some items may interact with your medicine. What should I watch for while using this medicine? Visit your doctor or health care professional for regular check ups. Let your doctor know if your symptoms do not improve or if you feel worse. Antacids should not be taken within one half hour before or after this medicine. What side effects may I notice from receiving this medicine? Side effects that you should report to your doctor or health care professional as soon as possible:  allergic reactions like skin rash, itching or hives, swelling of the face, lips, or tongue  difficulty breathing Side effects that usually do not require medical attention (report to your doctor or health care professional if they continue or are bothersome):  back pain  constipation  drowsy, dizzy  dry mouth  headache  stomach upset, gas  trouble sleeping This list may not describe all possible side effects. Call your doctor for medical advice about side effects. You may report side effects to FDA at 1-800-FDA-1088. Where should I keep my medicine? Keep out of the reach of children. Store at room temperature between 15 and 30 degrees C (59 and 86 degrees F). Keep container tightly closed. Throw away any unused medicine after the expiration date. NOTE: This sheet is  a summary. It may not cover all possible information. If you have questions about this medicine, talk to your doctor, pharmacist, or health care provider.  2020 Elsevier/Gold Standard (2007-11-07 15:46:20)

## 2019-08-28 ENCOUNTER — Ambulatory Visit (INDEPENDENT_AMBULATORY_CARE_PROVIDER_SITE_OTHER): Payer: Medicare Other | Admitting: *Deleted

## 2019-08-28 DIAGNOSIS — I428 Other cardiomyopathies: Secondary | ICD-10-CM | POA: Diagnosis not present

## 2019-08-28 LAB — CUP PACEART REMOTE DEVICE CHECK
Battery Remaining Longevity: 20 mo
Battery Voltage: 2.89 V
Brady Statistic AP VP Percent: 54.89 %
Brady Statistic AP VS Percent: 1.01 %
Brady Statistic AS VP Percent: 42.98 %
Brady Statistic AS VS Percent: 1.11 %
Brady Statistic RA Percent Paced: 52.46 %
Brady Statistic RV Percent Paced: 92.42 %
Date Time Interrogation Session: 20210609012403
HighPow Impedance: 71 Ohm
Implantable Lead Implant Date: 20160217
Implantable Lead Implant Date: 20160217
Implantable Lead Implant Date: 20160217
Implantable Lead Location: 753858
Implantable Lead Location: 753859
Implantable Lead Location: 753860
Implantable Lead Model: 4298
Implantable Lead Model: 5076
Implantable Pulse Generator Implant Date: 20160217
Lead Channel Impedance Value: 361 Ohm
Lead Channel Impedance Value: 399 Ohm
Lead Channel Impedance Value: 399 Ohm
Lead Channel Impedance Value: 418 Ohm
Lead Channel Impedance Value: 456 Ohm
Lead Channel Impedance Value: 456 Ohm
Lead Channel Impedance Value: 475 Ohm
Lead Channel Impedance Value: 475 Ohm
Lead Channel Impedance Value: 608 Ohm
Lead Channel Impedance Value: 608 Ohm
Lead Channel Impedance Value: 760 Ohm
Lead Channel Impedance Value: 817 Ohm
Lead Channel Impedance Value: 874 Ohm
Lead Channel Pacing Threshold Amplitude: 0.5 V
Lead Channel Pacing Threshold Amplitude: 0.875 V
Lead Channel Pacing Threshold Amplitude: 1.25 V
Lead Channel Pacing Threshold Pulse Width: 0.4 ms
Lead Channel Pacing Threshold Pulse Width: 0.4 ms
Lead Channel Pacing Threshold Pulse Width: 0.4 ms
Lead Channel Sensing Intrinsic Amplitude: 2.25 mV
Lead Channel Sensing Intrinsic Amplitude: 2.25 mV
Lead Channel Sensing Intrinsic Amplitude: 21.25 mV
Lead Channel Sensing Intrinsic Amplitude: 21.25 mV
Lead Channel Setting Pacing Amplitude: 2 V
Lead Channel Setting Pacing Amplitude: 2.25 V
Lead Channel Setting Pacing Amplitude: 2.5 V
Lead Channel Setting Pacing Pulse Width: 0.4 ms
Lead Channel Setting Pacing Pulse Width: 0.4 ms
Lead Channel Setting Sensing Sensitivity: 0.45 mV

## 2019-08-29 NOTE — Progress Notes (Signed)
Remote ICD transmission.   

## 2019-09-16 DIAGNOSIS — R001 Bradycardia, unspecified: Secondary | ICD-10-CM | POA: Insufficient documentation

## 2019-09-16 DIAGNOSIS — I428 Other cardiomyopathies: Secondary | ICD-10-CM | POA: Insufficient documentation

## 2019-09-16 DIAGNOSIS — I493 Ventricular premature depolarization: Secondary | ICD-10-CM | POA: Insufficient documentation

## 2019-09-18 ENCOUNTER — Other Ambulatory Visit: Payer: Self-pay

## 2019-09-18 ENCOUNTER — Other Ambulatory Visit (INDEPENDENT_AMBULATORY_CARE_PROVIDER_SITE_OTHER): Payer: Medicare Other

## 2019-09-18 ENCOUNTER — Telehealth: Payer: Self-pay | Admitting: Internal Medicine

## 2019-09-18 DIAGNOSIS — R300 Dysuria: Secondary | ICD-10-CM

## 2019-09-18 NOTE — Telephone Encounter (Signed)
Pt has been scheduled for a lab appt this afternoon. Pt stated that she would have to call back to schedule the video visit tomorrow.

## 2019-09-18 NOTE — Telephone Encounter (Signed)
Pt called and wanted to drop off a urine she thinks that she has a UTI Pt wouldn't go into detail on symptoms all she wanted was a call back from [place]

## 2019-09-18 NOTE — Addendum Note (Signed)
Addended by: [name] on: 09/18/2019 12:52 PM   Modules accepted: Orders

## 2019-09-18 NOTE — Telephone Encounter (Signed)
Yes,  She should drop off today so we will have results before weekend.  Schedule a 15 minute video or telephone visit on Friday.  If the urinalysis today suggests infection I will treat immediately since she has symptoms , but if the culture results show need for a different antibiotic I will adjust the medication

## 2019-09-18 NOTE — Telephone Encounter (Signed)
Spoke with pt and she stated that she has burning with urination, frequency, and lower back pain. She is wanting to know if she can come to the office today and give a urine sample since the long weekend is coming up.

## 2019-09-19 ENCOUNTER — Telehealth (INDEPENDENT_AMBULATORY_CARE_PROVIDER_SITE_OTHER): Payer: Medicare Other | Admitting: Internal Medicine

## 2019-09-19 VITALS — BP 140/76 | HR 77

## 2019-09-19 DIAGNOSIS — I428 Other cardiomyopathies: Secondary | ICD-10-CM

## 2019-09-19 DIAGNOSIS — I493 Ventricular premature depolarization: Secondary | ICD-10-CM

## 2019-09-19 DIAGNOSIS — I447 Left bundle-branch block, unspecified: Secondary | ICD-10-CM | POA: Diagnosis not present

## 2019-09-19 DIAGNOSIS — Z95810 Presence of automatic (implantable) cardiac defibrillator: Secondary | ICD-10-CM | POA: Diagnosis not present

## 2019-09-19 DIAGNOSIS — R001 Bradycardia, unspecified: Secondary | ICD-10-CM

## 2019-09-19 DIAGNOSIS — I5022 Chronic systolic (congestive) heart failure: Secondary | ICD-10-CM | POA: Diagnosis not present

## 2019-09-19 LAB — URINE CULTURE
MICRO NUMBER:: 10652829
SPECIMEN QUALITY:: ADEQUATE

## 2019-09-19 LAB — URINALYSIS, ROUTINE W REFLEX MICROSCOPIC
Bilirubin Urine: NEGATIVE
Hgb urine dipstick: NEGATIVE
Ketones, ur: NEGATIVE
Leukocytes,Ua: NEGATIVE
Nitrite: NEGATIVE
RBC / HPF: NONE SEEN (ref 0–?)
Specific Gravity, Urine: 1.01 (ref 1.000–1.030)
Total Protein, Urine: NEGATIVE
Urine Glucose: NEGATIVE
Urobilinogen, UA: 0.2 (ref 0.0–1.0)
WBC, UA: NONE SEEN (ref 0–?)
pH: 7.5 (ref 5.0–8.0)

## 2019-09-19 NOTE — Progress Notes (Signed)
Sitting  140/76   77 149/71   57 150/84   57   Standing  105/68   45 136/71   43 134/62   42

## 2019-09-19 NOTE — Progress Notes (Signed)
Electrophysiology TeleHealth Note   Due to national recommendations of social distancing due to COVID 19, an audio/video telehealth visit is felt to be most appropriate for this patient at this time.  See MyChart message from today for the patient's consent to telehealth for [facility].   Date:  09/19/2019   ID:  [name], DOB 1940-07-08, MRN [id number]  Location: patient's home  Provider location: [address], [place]  Evaluation Performed: Follow-up visit  PCP:  [name], MD  Cardiologist:     Electrophysiologist:  SK   Chief Complaint:     History of Present Illness:    [name] is a 79 y.o. female who presents via audio/video conferencing for a telehealth visit today.  Since last being seen in our clinic for NICM HTN chronic systolic faiure and CRT-D complicated by Twave oversensing resulting in loss of tracking with interval diagnosis of OI and weakness for which compression was recommended   the patient reports doing better not withstanding that she did not tolerate compression,  Some dyspnea with exertion; no edema   DATE TEST EF   9/15 Myoview 28%   1/16 Cath % CA min disease  10/17 Echo  55-65 %   6/20 Echo 60-65%       Date Cr K Hgb  7/17 0.55 4.1   1/19 0.73 4.9   3/20 0.8 4.1 12.5  11/20 0.5 4.2 14.3        The patient denies symptoms of fevers, chills, cough, or new SOB worrisome for COVID 19.    Past Medical History:  Diagnosis Date  . AICD (automatic cardioverter/defibrillator) present EP cardiologist--- dr Caryl Comes    placement 05-07-2014 , ef 25%,  NICM/  (02-11-2019 last echo 08-31-2018 ef 60-65%)  . Arthritis    "in about all my joints; for sure in my back"  . Bladder cancer [facility]) urologist-  dr Junious Silk   dx 1995--  recurrent bladder cancer 2015 , s/p TURBT's and chemo instillation's ;   04/ 2019  s/p TURBT  . Chronic hyponatremia   . Chronic low back pain with bilateral sciatica    s/p   spinal cord stimulator @ [facility]  10-09-2018  . Chronic systolic (congestive) heart failure [facility])    cardiologist-  dr Rogue Jury  . COPD with emphysema ([place])    (02-11-2019  per pt has never been on oxygen)  . Coronary artery disease cardiologist-  dr Kathlyn [place]   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% per cardiac cath 03-03-2014  . DDD (degenerative disc disease), thoracolumbar   . Frequent urination   . Full dentures   . Gait instability    due to chronic low back pain, uses roller walker  . GERD (gastroesophageal reflux disease)   . History of iron deficiency anemia 11/2013   resolved w/ IV Iron  (02-11-2019  per pt has not had any issues since 2015)  . History of stomach ulcers 11/2013  . Hypertension   . LBBB (left bundle branch block)   . NICM (nonischemic cardiomyopathy) ([place]) last echo 12-31-2015 ef 55-60%   dx 09/ 2015 per echo 20%;  myoview 09/ 2015 ef 28%;  per cardiac cath 12/ 2015 ef 30%;    . Nocturia more than twice per night   . S/P insertion of spinal cord stimulator followed by [facility]---- Dr Pecola Leisure (notes in care everywhere)   10-09-2018  @[facility] --- thoracic spinal cord stimular/ generator  (device  from [facility])---- per pt has a control  . Scoliosis     Past Surgical History:  Procedure Laterality Date  . BI-VENTRICULAR IMPLANTABLE CARDIOVERTER DEFIBRILLATOR N/A 05/07/2014   Procedure: BI-VENTRICULAR IMPLANTABLE CARDIOVERTER DEFIBRILLATOR  (CRT-D);  Surgeon: Deboraha Sprang, MD;  Location: [facility] CATH LAB;  Service: Cardiovascular;  Laterality: N/A;  . CARDIAC CATHETERIZATION  03-03-2014  dr Kathlyn [place]   [facility]   pLAD 20%, pRCA 20%, dRCA 50%, RPLS 50%;  ef 30%, mild elevated LVEDP, mild gradient across aortic valve LVOT  . CARDIOVASCULAR STRESS TEST  11/25/2013   High risk nuclear study w/ large high severity inferior wall perfusion defect on stress and rest images, large mild severity anteroseptal wall perfusion defect on stress and rest images, No inducible ischemia/  global moderate hypokinesis, ef 28%  . CATARACT EXTRACTION W/ INTRAOCULAR LENS  IMPLANT, BILATERAL Bilateral right 12-2013 / left  02-2014  . CYSTOSCOPY W/ RETROGRADES Bilateral 01/06/2015   Procedure: CYSTOSCOPY WITH  BLADDER BIOPSY BILATERAL RETROGRADE PYELOGRAM,INSTILLATION OF MITOMYCIN C;  Surgeon: [name], MD;  [facility];  Service: Urology;  Laterality: Bilateral;  . CYSTOSCOPY WITH BIOPSY N/A 10/06/2015   Procedure: CYSTO WITH BLADDER BIOPSY, FULGERATION, CHEMO IRRIGATION EPIRUBICIN IN PACU;  Surgeon: [name], MD;  [facility];  Service: Urology;  Laterality: N/A;  . CYSTOSCOPY WITH BIOPSY N/A 02/12/2019   Procedure: CYSTOSCOPY WITH BIOPSY/ FULGURATION/ INSTILLATION OF GEMCITABINE, bilateral retrograde turbt greater 5cm;  Surgeon: [name], MD;  Location: [facility];  Service: Urology;  Laterality: N/A;  . CYSTOSCOPY WITH FULGERATION N/A 06/30/2017   Procedure: Marland Kitchen CYSTOSCOPY WITH [place] FULGERATION/ BLADDER BIOPSY/ INSTILLATION OF EPIRUBICIN;  Surgeon: [name], MD;  Location: [facility];  Service: Urology;  Laterality: N/A;  . CYSTOSCOPY WITH RETROGRADE PYELOGRAM, URETEROSCOPY AND STENT PLACEMENT Bilateral 04/18/2014   Procedure: CYSTOSCOPY WITH RETROGRADE PYELOGRAM;  Surgeon: [name], MD;  [facility];  Service: Urology;  Laterality: Bilateral;  . ESOPHAGOGASTRODUODENOSCOPY N/A 11/28/2013   Procedure: ESOPHAGOGASTRODUODENOSCOPY (EGD);  Surgeon: Arta Silence, MD;  Location: [facility] ENDOSCOPY;  Service: Endoscopy;  Laterality: N/A;  . LUMBAR [place]  1980's   "ruptured disc"  . 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 TRANSTHORACIC ECHOCARDIOGRAM  12/31/2015   dr Caryl Comes   ef 24-401, grade 1 diastolic dysfunction/ mild MR/ septal motion showed abnormal function and dyssynergy  . TRANSURETHRAL  RESECTION OF BLADDER  1995  . TRANSURETHRAL RESECTION OF BLADDER TUMOR N/A 12/13/2013   Procedure: TRANSURETHRAL RESECTION OF BLADDER TUMOR (TURBT);  Surgeon: [name], MD;  [facility];  Service: Urology;  Laterality: N/A;  . TRANSURETHRAL RESECTION OF BLADDER TUMOR N/A 01/17/2014   Procedure: TRANSURETHRAL RESECTION OF BLADDER TUMOR (TURBT);  Surgeon: [name], MD;  [facility];  Service: Urology;  Laterality: N/A;  . TRANSURETHRAL RESECTION OF BLADDER TUMOR N/A 04/18/2014   Procedure: TRANSURETHRAL RESECTION OF BLADDER TUMOR (TURBT), CYSTOGRAM;  Surgeon: [name], MD;  [facility];  Service: Urology;  Laterality: N/A;    Current Outpatient Medications  Medication Sig Dispense Refill  . acetaminophen (TYLENOL) 500 MG tablet Take 500 mg by mouth every 8 (eight) hours as needed.    Marland Kitchen amitriptyline (ELAVIL) 10 MG tablet Take 1 tablet (10 mg total) by mouth at bedtime. 30 tablet 2  . amLODipine (NORVASC) 2.5 MG tablet Take 1 tablet (2.5 mg total) by mouth daily. [place]  tablet 3  . gabapentin (NEURONTIN) 300 MG capsule Three times daily as needed for back pain 270 capsule 1  . losartan (COZAAR) 100 MG tablet Take 1 tablet (100 mg total) by mouth at bedtime. 90 tablet 0  . metoprolol succinate (TOPROL-XL) 25 MG 24 hr tablet Take 1 tablet (25 mg total) by mouth daily. 90 tablet 3  . oxyCODONE-acetaminophen (PERCOCET) 10-325 MG tablet Take 1 tablet by mouth every 4 (four) hours as needed for pain. Must last 30 days. 150 tablet 0  . [START ON 10/16/2019] oxyCODONE-acetaminophen (PERCOCET) 10-325 MG tablet Take 1 tablet by mouth every 4 (four) hours as needed for pain. Must last 30 days. 150 tablet 0  . pantoprazole (PROTONIX) 40 MG tablet Take 1 tablet (40 mg total) by mouth daily. 30 tablet 3  . polyethylene glycol (MIRALAX / GLYCOLAX) packet Take 17 g by mouth daily as needed for mild constipation.     . promethazine (PHENERGAN) 12.5 MG tablet Take 1 tablet (12.5 mg  total) by mouth every 8 (eight) hours as needed for nausea or vomiting. 30 tablet 2  . rosuvastatin (CRESTOR) 5 MG tablet Take 1 tablet (5 mg total) by mouth daily. 90 tablet 0  . solifenacin (VESICARE) 5 MG tablet Take 5 mg by mouth daily.    . sucralfate (CARAFATE) 1 g tablet Take one tablet one hour before each meal and 2 hours AFTER other medications 90 tablet 1  . Meth-Hyo-M Bl-Na Phos-Ph Sal (URIBEL) 118 MG CAPS TK 1 C PO Q 6 H PRN (Patient not taking: Reported on 09/19/2019)    . phenazopyridine (PYRIDIUM) 200 MG tablet Take 200 mg by mouth 3 (three) times daily as needed. (Patient not taking: Reported on 09/19/2019)    . Vitamin D, Ergocalciferol, (DRISDOL) 50000 units CAPS capsule TK ONE C PO Q WEEK FOR 8 WEEKS (Patient not taking: Reported on 09/19/2019)  0   No current facility-administered medications for this visit.   Facility-Administered Medications Ordered in Other Visits  Medication Dose Route Frequency Provider Last Rate Last Admin  . epirubicin (ELLENCE) 50 mg in sodium chloride 0.9 % bladder instillation  50 mg Bladder Instillation Once [name], MD      . gemcitabine [facility]) chemo syringe for bladder instillation 2,000 mg  2,000 mg Bladder Instillation Once [name], MD        Allergies:   Patient has no known allergies.   Social History:  The patient  reports that she has been smoking cigarettes. She has a 28.00 pack-year smoking history. She has never used smokeless tobacco. She reports that she does not drink alcohol and does not use drugs.   Family History:  The patient's   family history includes Cancer in her brother and mother.   ROS:  Please see the history of present illness.   All other systems are personally reviewed and negative.    Exam:    Vital Signs:  BP 140/76   Pulse 77         Labs/Other Tests and Data Reviewed:    Recent Labs: 06/03/2019: Magnesium 2.3 06/17/2019: Hemoglobin 13.8; Platelets 162 08/27/2019: ALT 32; BUN 13;  Creatinine, Ser 0.64; Potassium 4.7; Sodium 130   Wt Readings from Last 3 Encounters:  08/27/19 122 lb 3.2 oz (55.4 kg)  08/13/19 120 lb (54.4 kg)  07/15/19 120 lb (54.4 kg)     Other studies personally reviewed:      ASSESSMENT & PLAN:    Orthostatic Hypotension  Cardiomyopathy nonischemic  Congestive heart failure-chronic systolic  COPD/emphysema  Hypertension  Left bundle branch block  PVCs-infrequent  Sinus bradycardia-borderline  Orthostatic hypotension is improved  No congestive heart failure  BP is reasonably controlled on multiple meds and if she recurrent symptomatic OI would try mestinon    COVID 19 screen The patient denies symptoms of COVID 19 at this time.  The importance of social distancing was discussed today.  Follow-up:  91m telehealth visit  Or OV    Current medicines are reviewed at length with the patient today.   The patient does not have concerns regarding her medicines.  The following changes were made today:   Labs/ tests ordered today include:   No orders of the defined types were placed in this encounter.   Future tests ( post COVID )     Patient Risk:  after full review of this patients clinical status, I feel that they are at moderate  risk at this time.  Today, I have spent  8 minutes with the patient with telehealth technology discussing the above.  Signed, Virl Axe, MD  09/19/2019 4:19 PM     [address] [place] [id number] [phone] (office) [phone] (fax)

## 2019-09-19 NOTE — Patient Instructions (Signed)
Medication Instructions:  Your physician recommends that you continue on your current medications as directed. Please refer to the Current Medication list given to you today.  *If you need a refill on your cardiac medications before your next appointment, please call your pharmacy*   Lab Work: None ordered.  If you have labs (blood work) drawn today and your tests are completely normal, you will receive your results only by:  [place] (if you have MyChart) OR  A paper copy in the mail If you have any lab test that is abnormal or we need to change your treatment, we will call you to review the results.   Testing/Procedures: None ordered.    Follow-Up: At [facility], you and your health needs are our priority.  As part of our continuing mission to provide you with exceptional heart care, we have created designated Provider Care Teams.  These Care Teams include your primary Cardiologist (physician) and Advanced Practice Providers (APPs -  Physician Assistants and Nurse Practitioners) who all work together to provide you with the care you need, when you need it.  We recommend signing up for the patient portal called "MyChart".  Sign up information is provided on this After Visit Summary.  MyChart is used to connect with patients for Virtual Visits (Telemedicine).  Patients are able to view lab/test results, encounter notes, upcoming appointments, etc.  Non-urgent messages can be sent to your provider as well.   To learn more about what you can do with MyChart, go to [email or web address].    Your next appointment:  6 month virtual visit with Dr Caryl Comes.  You will receive a letter to remind you to schedule your appointment.

## 2019-09-20 ENCOUNTER — Telehealth (INDEPENDENT_AMBULATORY_CARE_PROVIDER_SITE_OTHER): Payer: Medicare Other | Admitting: Internal Medicine

## 2019-09-20 ENCOUNTER — Encounter: Payer: Self-pay | Admitting: Internal Medicine

## 2019-09-20 DIAGNOSIS — M545 Low back pain: Secondary | ICD-10-CM | POA: Diagnosis not present

## 2019-09-20 DIAGNOSIS — M549 Dorsalgia, unspecified: Secondary | ICD-10-CM | POA: Insufficient documentation

## 2019-09-20 DIAGNOSIS — G8929 Other chronic pain: Secondary | ICD-10-CM

## 2019-09-20 NOTE — Progress Notes (Signed)
Telephone Note  This visit type was conducted due to national recommendations for restrictions regarding the COVID-19 pandemic (e.g. social distancing).  This format is felt to be most appropriate for this patient at this time.  All issues noted in this document were discussed and addressed.  No physical exam was performed (except for noted visual exam findings with Video Visits).   I connected with@ on 09/20/19 at  4:30 PM EDT by telephone and verified that I am speaking with the correct person using two identifiers. Location patient: home Location provider: work or home office Persons participating in the virtual visit: patient, provider  I discussed the limitations, risks, security and privacy concerns of performing an evaluation and management service by telephone and the availability of in person appointments. I also discussed with the patient that there may be a patient responsible charge related to this service. The patient expressed understanding and agreed to proceed.  Reason for visit: dysuria, back pain   HPI:  79 yr old female with history of bladder cancer,  Treated , with most recent cystoscopy   ROS: See pertinent positives and negatives per HPI.  Past Medical History:  Diagnosis Date  . AICD (automatic cardioverter/defibrillator) present EP cardiologist--- dr Caryl Comes    placement 05-07-2014 , ef 25%,  NICM/  (02-11-2019 last echo 08-31-2018 ef 60-65%)  . Arthritis    "in about all my joints; for sure in my back"  . Bladder cancer [facility]) urologist-  dr Junious Silk   dx 1995--  recurrent bladder cancer 2015 , s/p TURBT's and chemo instillation's ;   04/ 2019  s/p TURBT  . Chronic hyponatremia   . Chronic low back pain with bilateral sciatica    s/p  spinal cord stimulator @ [facility]  10-09-2018  . Chronic systolic (congestive) heart failure [facility])    cardiologist-  dr Rogue Jury  . COPD with emphysema ([place])    (02-11-2019  per pt has never been on oxygen)  . Coronary artery  disease cardiologist-  dr Kathlyn [place]   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% per cardiac cath 03-03-2014  . DDD (degenerative disc disease), thoracolumbar   . Frequent urination   . Full dentures   . Gait instability    due to chronic low back pain, uses roller walker  . GERD (gastroesophageal reflux disease)   . History of iron deficiency anemia 11/2013   resolved w/ IV Iron  (02-11-2019  per pt has not had any issues since 2015)  . History of stomach ulcers 11/2013  . Hypertension   . LBBB (left bundle branch block)   . NICM (nonischemic cardiomyopathy) ([place]) last echo 12-31-2015 ef 55-60%   dx 09/ 2015 per echo 20%;  myoview 09/ 2015 ef 28%;  per cardiac cath 12/ 2015 ef 30%;    . Nocturia more than twice per night   . S/P insertion of spinal cord stimulator followed by [facility]---- Dr Pecola Leisure (notes in care everywhere)   10-09-2018  @[facility] --- thoracic spinal cord stimular/ generator  (device from [facility])---- per pt has a control  . Scoliosis     Past Surgical History:  Procedure Laterality Date  . BI-VENTRICULAR IMPLANTABLE CARDIOVERTER DEFIBRILLATOR N/A 05/07/2014   Procedure: BI-VENTRICULAR IMPLANTABLE CARDIOVERTER DEFIBRILLATOR  (CRT-D);  Surgeon: Deboraha Sprang, MD;  Location: [facility] CATH LAB;  Service: Cardiovascular;  Laterality: N/A;  . CARDIAC CATHETERIZATION  03-03-2014  dr Kathlyn [place]   [facility]   pLAD 20%, pRCA 20%, dRCA 50%, RPLS 50%;  ef  30%, mild elevated LVEDP, mild gradient across aortic valve LVOT  . CARDIOVASCULAR STRESS TEST  11/25/2013   High risk nuclear study w/ large high severity inferior wall perfusion defect on stress and rest images, large mild severity anteroseptal wall perfusion defect on stress and rest images, No inducible ischemia/ global moderate hypokinesis, ef 28%  . CATARACT EXTRACTION W/ INTRAOCULAR LENS  IMPLANT, BILATERAL Bilateral right 12-2013 / left  02-2014  . CYSTOSCOPY W/ RETROGRADES Bilateral 01/06/2015   Procedure: CYSTOSCOPY WITH  BLADDER  BIOPSY BILATERAL RETROGRADE PYELOGRAM,INSTILLATION OF MITOMYCIN C;  Surgeon: [name], MD;  [facility];  Service: Urology;  Laterality: Bilateral;  . CYSTOSCOPY WITH BIOPSY N/A 10/06/2015   Procedure: CYSTO WITH BLADDER BIOPSY, FULGERATION, CHEMO IRRIGATION EPIRUBICIN IN PACU;  Surgeon: [name], MD;  [facility];  Service: Urology;  Laterality: N/A;  . CYSTOSCOPY WITH BIOPSY N/A 02/12/2019   Procedure: CYSTOSCOPY WITH BIOPSY/ FULGURATION/ INSTILLATION OF GEMCITABINE, bilateral retrograde turbt greater 5cm;  Surgeon: [name], MD;  Location[facility];  Service: Urology;  Laterality: N/A;  . CYSTOSCOPY WITH FULGERATION N/A 06/30/2017   Procedure: Marland Kitchen CYSTOSCOPY WITH [place] FULGERATION/ BLADDER BIOPSY/ INSTILLATION OF EPIRUBICIN;  Surgeon: [name], MD;  Location: [facility];  Service: Urology;  Laterality: N/A;  . CYSTOSCOPY WITH RETROGRADE PYELOGRAM, URETEROSCOPY AND STENT PLACEMENT Bilateral 04/18/2014   Procedure: CYSTOSCOPY WITH RETROGRADE PYELOGRAM;  Surgeon: [name], MD;  [facility];  Service: Urology;  Laterality: Bilateral;  . ESOPHAGOGASTRODUODENOSCOPY N/A 11/28/2013   Procedure: ESOPHAGOGASTRODUODENOSCOPY (EGD);  Surgeon: Arta Silence, MD;  Location: [facility] ENDOSCOPY;  Service: Endoscopy;  Laterality: N/A;  . LUMBAR [place]  1980's   "ruptured disc"  . 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 TRANSTHORACIC ECHOCARDIOGRAM  12/31/2015   dr Caryl Comes   ef 71-062, grade 1 diastolic dysfunction/ mild MR/ septal motion showed abnormal function and dyssynergy  . TRANSURETHRAL RESECTION OF BLADDER  1995  . TRANSURETHRAL RESECTION OF BLADDER TUMOR N/A 12/13/2013   Procedure: TRANSURETHRAL RESECTION OF BLADDER TUMOR (TURBT);  Surgeon: [name], MD;  [facility];  Service: Urology;  Laterality:  N/A;  . TRANSURETHRAL RESECTION OF BLADDER TUMOR N/A 01/17/2014   Procedure: TRANSURETHRAL RESECTION OF BLADDER TUMOR (TURBT);  Surgeon: [name], MD;  [facility];  Service: Urology;  Laterality: N/A;  . TRANSURETHRAL RESECTION OF BLADDER TUMOR N/A 04/18/2014   Procedure: TRANSURETHRAL RESECTION OF BLADDER TUMOR (TURBT), CYSTOGRAM;  Surgeon: [name], MD;  [facility];  Service: Urology;  Laterality: N/A;    Family History  Problem Relation Age of Onset  . Cancer Mother   . Cancer Brother     SOCIAL HX:  reports that she has been smoking cigarettes. She has a 28.00 pack-year smoking history. She has never used smokeless tobacco. She reports that she does not drink alcohol and does not use drugs.   Current Outpatient Medications:  .  acetaminophen (TYLENOL) 500 MG tablet, Take 500 mg by mouth every 8 (eight) hours as needed., Disp: , Rfl:  .  amitriptyline (ELAVIL) 10 MG tablet, Take 1 tablet (10 mg total) by mouth at bedtime., Disp: 30 tablet, Rfl: 2 .  amLODipine (NORVASC) 2.5 MG tablet, Take 1 tablet (2.5 mg total) by mouth daily., Disp: 90 tablet, Rfl: 3 .  gabapentin (NEURONTIN) 300 MG capsule, Three times daily as needed for back pain, Disp: 270  capsule, Rfl: 1 .  losartan (COZAAR) 100 MG tablet, Take 1 tablet (100 mg total) by mouth at bedtime., Disp: 90 tablet, Rfl: 0 .  metoprolol succinate (TOPROL-XL) 25 MG 24 hr tablet, Take 1 tablet (25 mg total) by mouth daily., Disp: 90 tablet, Rfl: 3 .  oxyCODONE-acetaminophen (PERCOCET) 10-325 MG tablet, Take 1 tablet by mouth every 4 (four) hours as needed for pain. Must last 30 days., Disp: 150 tablet, Rfl: 0 .  [START ON 10/16/2019] oxyCODONE-acetaminophen (PERCOCET) 10-325 MG tablet, Take 1 tablet by mouth every 4 (four) hours as needed for pain. Must last 30 days., Disp: 150 tablet, Rfl: 0 .  pantoprazole (PROTONIX) 40 MG tablet, Take 1 tablet (40 mg total) by mouth daily., Disp: 30 tablet, Rfl: 3 .  polyethylene  glycol (MIRALAX / GLYCOLAX) packet, Take 17 g by mouth daily as needed for mild constipation. , Disp: , Rfl:  .  promethazine (PHENERGAN) 12.5 MG tablet, Take 1 tablet (12.5 mg total) by mouth every 8 (eight) hours as needed for nausea or vomiting., Disp: 30 tablet, Rfl: 2 .  rosuvastatin (CRESTOR) 5 MG tablet, Take 1 tablet (5 mg total) by mouth daily., Disp: 90 tablet, Rfl: 0 .  solifenacin (VESICARE) 5 MG tablet, Take 5 mg by mouth daily., Disp: , Rfl:  .  sucralfate (CARAFATE) 1 g tablet, Take one tablet one hour before each meal and 2 hours AFTER other medications, Disp: 90 tablet, Rfl: 1 .  Meth-Hyo-M Bl-Na Phos-Ph Sal (URIBEL) 118 MG CAPS, TK 1 C PO Q 6 H PRN (Patient not taking: Reported on 09/19/2019), Disp: , Rfl:  .  phenazopyridine (PYRIDIUM) 200 MG tablet, Take 200 mg by mouth 3 (three) times daily as needed. (Patient not taking: Reported on 09/19/2019), Disp: , Rfl:  .  Vitamin D, Ergocalciferol, (DRISDOL) 50000 units CAPS capsule, TK ONE C PO Q WEEK FOR 8 WEEKS (Patient not taking: Reported on 09/19/2019), Disp: , Rfl: 0 No current facility-administered medications for this visit.  Facility-Administered Medications Ordered in Other Visits:  .  epirubicin (ELLENCE) 50 mg in sodium chloride 0.9 % bladder instillation, 50 mg, Bladder Instillation, Once, [name], MD .  gemcitabine [facility]) chemo syringe for bladder instillation 2,000 mg, 2,000 mg, Bladder Instillation, Once, [name], MD  EXAM:   General impression: alert, cooperative and articulate.  No signs of being in distress  Lungs: speech is fluent sentence length suggests that patient is not short of breath and not punctuated by cough, sneezing or sniffing. Marland Kitchen   Psych: affect normal.  speech is articulate and non pressured .  Denies suicidal thoughts   ASSESSMENT AND PLAN:  Discussed the following assessment and plan:  Chronic right-sided low back pain without sciatica  Back pain Chronic,  With patient  concerned about UTI as cause.  Reassured that There is no evidence of UTI by culture results.  Advised to increase water intake,  Consider cranberry tablets for prevention     I discussed the assessment and treatment plan with the patient. The patient was provided an opportunity to ask questions and all were answered. The patient agreed with the plan and demonstrated an understanding of the instructions.   The patient was advised to call back or seek an in-person evaluation if the symptoms worsen or if the condition fails to improve as anticipated.  I provided 18 minutes of non-face-to-face time during this encounter.   [name], MD

## 2019-09-20 NOTE — Assessment & Plan Note (Signed)
Chronic,  With patient concerned about UTI as cause.  Reassured that There is no evidence of UTI by culture results.  Advised to increase water intake,  Consider cranberry tablets for prevention

## 2019-10-29 ENCOUNTER — Other Ambulatory Visit: Payer: Self-pay | Admitting: Internal Medicine

## 2019-11-13 ENCOUNTER — Ambulatory Visit
Payer: Medicare Other | Attending: Student in an Organized Health Care Education/Training Program | Admitting: Student in an Organized Health Care Education/Training Program

## 2019-11-13 ENCOUNTER — Encounter: Payer: Self-pay | Admitting: Student in an Organized Health Care Education/Training Program

## 2019-11-13 ENCOUNTER — Other Ambulatory Visit: Payer: Self-pay

## 2019-11-13 VITALS — BP 157/96 | HR 76 | Temp 98.0°F | Resp 18 | Ht 68.0 in | Wt 120.0 lb

## 2019-11-13 DIAGNOSIS — M48062 Spinal stenosis, lumbar region with neurogenic claudication: Secondary | ICD-10-CM | POA: Diagnosis not present

## 2019-11-13 DIAGNOSIS — M5416 Radiculopathy, lumbar region: Secondary | ICD-10-CM

## 2019-11-13 DIAGNOSIS — R2689 Other abnormalities of gait and mobility: Secondary | ICD-10-CM | POA: Diagnosis not present

## 2019-11-13 DIAGNOSIS — M47818 Spondylosis without myelopathy or radiculopathy, sacral and sacrococcygeal region: Secondary | ICD-10-CM | POA: Diagnosis not present

## 2019-11-13 DIAGNOSIS — Z9689 Presence of other specified functional implants: Secondary | ICD-10-CM | POA: Diagnosis not present

## 2019-11-13 DIAGNOSIS — M47816 Spondylosis without myelopathy or radiculopathy, lumbar region: Secondary | ICD-10-CM | POA: Diagnosis not present

## 2019-11-13 DIAGNOSIS — M5136 Other intervertebral disc degeneration, lumbar region: Secondary | ICD-10-CM | POA: Diagnosis not present

## 2019-11-13 DIAGNOSIS — G894 Chronic pain syndrome: Secondary | ICD-10-CM | POA: Diagnosis not present

## 2019-11-13 MED ORDER — OXYCODONE-ACETAMINOPHEN 10-325 MG PO TABS: 1.0000 | ORAL_TABLET | ORAL | 0 refills | Status: DC | PRN

## 2019-11-13 MED ORDER — OXYCODONE-ACETAMINOPHEN 10-325 MG PO TABS: 1.0000 | ORAL_TABLET | ORAL | 0 refills | Status: AC | PRN

## 2019-11-13 NOTE — Progress Notes (Signed)
PROVIDER NOTE: Information contained herein reflects review and annotations entered in association with encounter. Interpretation of such information and data should be left to medically-trained personnel. Information provided to patient can be located elsewhere in the medical record under "Patient Instructions". Document created using STT-dictation technology, any transcriptional errors that may result from process are unintentional.    Patient: [name]  Service Category: E/M  Provider: [name], MD  DOB: 1940/07/10  DOS: 11/13/2019  Specialty: Interventional Pain Management  MRN: [id number]  Setting: Ambulatory outpatient  PCP: [name], MD  Type: Established Patient    Referring Provider: [name], MD  Location: Office  Delivery: Face-to-face     HPI  Reason for encounter: [name], a 79 y.o. year old female, is here today for evaluation and management of her Lumbar radiculopathy [M54.16]. [name] primary complain today is Back Pain (low) Last encounter: Practice (08/13/2019). My last encounter with her was on 08/13/2019. Pertinent problems: [name] has Spinal stenosis of thoracolumbar region; Spinal stenosis in cervical region; Spinal stenosis, lumbar region, with neurogenic claudication; Lumbar spondylosis; SI joint arthritis; Lumbar degenerative disc disease; Chronic pain syndrome; Lumbar radiculopathy; Lumbar facet arthropathy; and Back pain on their pertinent problem list. Pain Assessment: Severity of Chronic pain is reported as a 8 /10. Location: Back Lower/buttocks and legs, worse on the left. Onset: More than a month ago. Quality: Constant. Timing: Constant. Modifying factor(s): medications, rest, biofreeze. Vitals:  height is 5' 8"  (1.727 m) and weight is 120 lb (54.4 kg). Her temporal temperature is 98 F (36.7 C). Her blood pressure is 157/96 (abnormal) and her pulse is 76. Her respiration is 18 and oxygen saturation is 99%.   No change in medical history  since last visit.  Patient's pain is at baseline.  Patient continues multimodal pain regimen as prescribed.  States that it provides pain relief and improvement in functional status. Spinal cord stimulator in place.  Patient states that she is receiving mild benefit at best from stimulation.  Pharmacotherapy Assessment   10/15/2019  1   08/13/2019  Oxycodone-Acetaminophen 10-325  150.00  30 Bi Lat   [id number]   [facility] (7587)   0/0  75.00 MME  Comm Ins   [place]      Monitoring: [place] PMP: PDMP not reviewed this encounter.       Pharmacotherapy: No side-effects or adverse reactions reported. Compliance: No problems identified. Effectiveness: Clinically acceptable.  Hart Rochester, RN  11/13/2019  1:39 PM  Sign when Signing Visit Nursing Pain Medication Assessment:  Safety precautions to be maintained throughout the outpatient stay will include: orient to surroundings, keep bed in low position, maintain call bell within reach at all times, provide assistance with transfer out of bed and ambulation.  Medication Inspection Compliance: Pill count conducted under aseptic conditions, in front of the patient. Neither the pills nor the bottle was removed from the patient's sight at any time. Once count was completed pills were immediately returned to the patient in their original bottle.  Medication: Oxycodone IR Pill/Patch Count: 6 of 150 pills remain Pill/Patch Appearance: Markings consistent with prescribed medication Bottle Appearance: Standard pharmacy container. Clearly labeled. Filled Date: 07 / 27 / 2021 Last Medication intake:  Today    UDS:  Summary  Date Value Ref Range Status  08/13/2019 Note  Final    Comment:    ==================================================================== [employer] [facility] 13 (MW) ==================================================================== Test  Result       Flag       Units Drug Present and Declared for Prescription  Verification   Oxycodone                      3800         EXPECTED   ng/mg creat   Oxymorphone                    10426        EXPECTED   ng/mg creat   Noroxycodone                   10739        EXPECTED   ng/mg creat   Noroxymorphone                 2334         EXPECTED   ng/mg creat    Sources of oxycodone are scheduled prescription medications.    Oxymorphone, noroxycodone, and noroxymorphone are expected    metabolites of oxycodone. Oxymorphone is also available as a    scheduled prescription medication. ==================================================================== Test                      Result    Flag   Units      Ref Range   Creatinine              38               mg/dL      >=20 ==================================================================== Declared Medications:  The flagging and interpretation on this report are based on the  following declared medications.  Unexpected results may arise from  inaccuracies in the declared medications.  **Note: The testing scope of this panel includes these medications:  Oxycodone (Percocet)  **Note: The testing scope of this panel does not include the  following reported medications:  Acetaminophen (Tylenol)  Acetaminophen (Percocet)  Amitriptyline (Elavil)  Amlodipine (Norvasc)  Gabapentin (Neurontin)  Hyoscyamine (Uribel)  Losartan (Cozaar)  Methenamine (Uribel)  Methylene Blue (Uribel)  Metoprolol (Toprol)  Pantoprazole (Protonix)  Phenazopyridine (Pyridium)  Phenyl salicylate (Uribel)  Polyethylene Glycol (MiraLAX)  Promethazine (Phenergan)  Rosuvastatin (Crestor)  Sodium phosphate, monobasic (Uribel)  Solifenacin (Vesicare)  Vitamin D2 (Drisdol) ==================================================================== For clinical consultation, please call [phone]. ====================================================================      ROS  Constitutional: Denies any fever or chills Gastrointestinal:  No reported hemesis, hematochezia, vomiting, or acute GI distress Musculoskeletal: Low back, bilateral leg pain Neurological: No reported episodes of acute onset apraxia, aphasia, dysarthria, agnosia, amnesia, paralysis, loss of coordination, or loss of consciousness  Medication Review  acetaminophen, amLODipine, amitriptyline, gabapentin, losartan, metoprolol succinate, oxyCODONE-acetaminophen, pantoprazole, polyethylene glycol, promethazine, rosuvastatin, solifenacin, and sucralfate  History Review  Allergy: [name] has No Known Allergies. Drug: [name]  reports no history of drug use. Alcohol:  reports no history of alcohol use. Tobacco:  reports that she has been smoking cigarettes. She has a 28.00 pack-year smoking history. She has never used smokeless tobacco. Social: [name]  reports that she has been smoking cigarettes. She has a 28.00 pack-year smoking history. She has never used smokeless tobacco. She reports that she does not drink alcohol and does not use drugs. Medical:  has a past medical history of AICD (automatic cardioverter/defibrillator) present ([place] cardiologist--- dr Caryl Comes ), Arthritis, Bladder cancer [facility]) (urologist-  dr Junious Silk), Chronic hyponatremia, Chronic low back pain with bilateral  sciatica, Chronic systolic (congestive) heart failure ([place]), COPD with emphysema ([place]), Coronary artery disease (cardiologist-  dr Kathlyn [place]), DDD (degenerative disc disease), thoracolumbar, Frequent urination, Full dentures, Gait instability, GERD (gastroesophageal reflux disease), History of iron deficiency anemia (11/2013), History of stomach ulcers (11/2013), Hypertension, LBBB (left bundle branch block), NICM (nonischemic cardiomyopathy) ([place]) (last echo 12-31-2015 ef 55-60%), Nocturia more than twice per night, S/P insertion of spinal cord stimulator (followed by [facility]---- Dr Pecola Leisure (notes in care everywhere)), and Scoliosis. Surgical: Ms. Denno  has a past surgical  history that includes Esophagogastroduodenoscopy (N/A, 11/28/2013); Transurethral resection of bladder tumor (N/A, 12/13/2013); Transurethral resection of bladder tumor (N/A, 01/17/2014); Cataract extraction w/ intraocular lens  implant, bilateral (Bilateral, right 12-2013 / left  02-2014); Cystoscopy with retrograde pyelogram, ureteroscopy and stent placement (Bilateral, 04/18/2014); Transurethral resection of bladder tumor (N/A, 04/18/2014); Lumbar disc surgery (1980's); bi-ventricular implantable cardioverter defibrillator (N/A, 05/07/2014); Cystoscopy w/ retrogrades (Bilateral, 01/06/2015); Cystoscopy with biopsy (N/A, 10/06/2015); Cardiac catheterization (03-03-2014  dr Kathlyn [place]   [facility]); Transurethral resection of bladder (1995); Cardiovascular stress test (11/25/2013); transthoracic echocardiogram (12/31/2015   dr Caryl Comes); Cystoscopy with fulgeration (N/A, 06/30/2017); Spinal cord stimulator implant (10-09-2018   @[facility] ); and Cystoscopy with biopsy (N/A, 02/12/2019). Family: family history includes Cancer in her brother and mother.  Laboratory Chemistry Profile   Renal Lab Results  Component Value Date   BUN 13 08/27/2019   CREATININE 0.64 08/27/2019   BCR 18 04/28/2014   GFR 89.19 08/27/2019   GFRAA >60 06/17/2019   GFRNONAA >60 06/17/2019     Hepatic Lab Results  Component Value Date   AST 38 (H) 08/27/2019   ALT 32 08/27/2019   ALBUMIN 4.5 08/27/2019   ALKPHOS 83 08/27/2019   LIPASE 30 11/23/2013     Electrolytes Lab Results  Component Value Date   NA 130 (L) 08/27/2019   K 4.7 08/27/2019   CL 96 08/27/2019   CALCIUM 9.8 08/27/2019   MG 2.3 06/03/2019     Bone Lab Results  Component Value Date   VD25OH 36.17 01/11/2019     Inflammation (CRP: Acute Phase) (ESR: Chronic Phase) No results found for: CRP, ESRSEDRATE, LATICACIDVEN     Note: Above Lab results reviewed.  Recent Imaging Review  CUP PACEART REMOTE DEVICE CHECK Scheduled remote reviewed. Normal device  function.    Next remote 91 days.  Kathy Breach, RN, CCDS, [employer] Note: Reviewed        Physical Exam  General appearance: Well nourished, well developed, and well hydrated. In no apparent acute distress Mental status: Alert, oriented x 3 (person, place, & time)       Respiratory: No evidence of acute respiratory distress Eyes: PERLA Vitals: BP (!) 157/96    Pulse 76    Temp 98 F (36.7 C) (Temporal)    Resp 18    Ht 5' 8"  (1.727 m)    Wt 120 lb (54.4 kg)    SpO2 99%    BMI 18.25 kg/m  BMI: Estimated body mass index is 18.25 kg/m as calculated from the following:   Height as of this encounter: 5' 8"  (1.727 m).   Weight as of this encounter: 120 lb (54.4 kg). Ideal: Ideal body weight: 63.9 kg (140 lb 14 oz)   Lumbar Exam  Skin & Axial Inspection: Well healed scar from previous spine surgery detected Alignment: Symmetrical Functional ROM: Pain restricted ROM       Stability: No instability detected Muscle Tone/Strength: Functionally intact.  No obvious neuro-muscular anomalies detected. Sensory (Neurological): Dermatomal pain pattern Palpation: No palpable anomalies         Gait & Posture Assessment  Ambulation: Patient ambulates using a walker Gait: Limited. Using assistive device to ambulate Posture: Difficulty standing up straight, due to pain   Lower Extremity Exam    Side: Right lower extremity  Side: Left lower extremity  Stability: No instability observed          Stability: No instability observed          Skin & Extremity Inspection: Skin color, temperature, and hair growth are WNL. No peripheral edema or cyanosis. No masses, redness, swelling, asymmetry, or associated skin lesions. No contractures.  Skin & Extremity Inspection: Skin color, temperature, and hair growth are WNL. No peripheral edema or cyanosis. No masses, redness, swelling, asymmetry, or associated skin lesions. No contractures.  Functional ROM: Pain restricted ROM for all joints  of the lower extremity          Functional ROM: Pain restricted ROM for all joints of the lower extremity          Muscle Tone/Strength: Functionally intact. No obvious neuro-muscular anomalies detected.  Muscle Tone/Strength: Functionally intact. No obvious neuro-muscular anomalies detected.  Sensory (Neurological): Dermatomal pain pattern        Sensory (Neurological): Dermatomal pain pattern        DTR: Patellar: deferred today Achilles: deferred today Plantar: deferred today  DTR: Patellar: deferred today Achilles: deferred today Plantar: deferred today  Palpation: No palpable anomalies  Palpation: No palpable anomalies     Assessment   Status Diagnosis  Persistent Persistent Persistent 1. Lumbar radiculopathy   2. Chronic pain syndrome   3. Lumbar degenerative disc disease   4. Spinal stenosis, lumbar region, with neurogenic claudication   5. Spinal cord stimulator status   6. Balance problem   7. Lumbar facet arthropathy   8. SI joint arthritis      Updated Problems: Problem  Back Pain  Lumbar Facet Arthropathy  Chronic Pain Syndrome  Lumbar Radiculopathy  Lumbar Spondylosis  Si Joint Arthritis  Lumbar Degenerative Disc Disease  Spinal Stenosis, Lumbar Region, With Neurogenic Claudication  Spinal Stenosis in Cervical Region   Her cervical spine MRI done at [facility] Jan 16 2014 suggests that spinal stenosis may be occurring at levels C4 thought C7 due to spurring and disk bulging.  She will need to request a copy of the images from Radiology to take with her to neurosurgeon  appt     Spinal Stenosis of Thoracolumbar Region  Spinal Cord Stimulator Status    Plan of Care  [name] has a current medication list which includes the following long-term medication(s): amlodipine, gabapentin, metoprolol succinate, pantoprazole, promethazine, rosuvastatin, amitriptyline, losartan, and sucralfate.  Pharmacotherapy (Medications Ordered): Meds ordered this  encounter  Medications   oxyCODONE-acetaminophen (PERCOCET) 10-325 MG tablet    Sig: Take 1 tablet by mouth every 4 (four) hours as needed for pain. Must last 30 days.    Dispense:  150 tablet    Refill:  0    Chronic Pain. (STOP Act - Not applicable). Fill one day early if closed on scheduled refill date.  Max 5 tablets a daily, 150/month.   oxyCODONE-acetaminophen (PERCOCET) 10-325 MG tablet    Sig: Take 1 tablet by mouth every 4 (four) hours as needed for pain. Must last 30 days.    Dispense:  150 tablet    Refill:  0  Chronic Pain. (STOP Act - Not applicable). Fill one day early if closed on scheduled refill date.  Max 5 tablets a daily, 150/month.   oxyCODONE-acetaminophen (PERCOCET) 10-325 MG tablet    Sig: Take 1 tablet by mouth every 4 (four) hours as needed for pain. Must last 30 days.    Dispense:  150 tablet    Refill:  0    Chronic Pain. (STOP Act - Not applicable). Fill one day early if closed on scheduled refill date.  Max 5 tablets a daily, 150/month.   Continue with SCS.  Orders:  No orders of the defined types were placed in this encounter.  Follow-up plan:   Return in about 3 months (around 02/13/2020) for Medication Management, in person.   Recent Visits No visits were found meeting these conditions. Showing recent visits within past 90 days and meeting all other requirements Today's Visits Date Type Provider Dept  11/13/19 Office Visit [name], MD [facility]  Showing today's visits and meeting all other requirements Future Appointments Date Type Provider Dept  02/11/20 Appointment [name], MD [facility]  Showing future appointments within next 90 days and meeting all other requirements  I discussed the assessment and treatment plan with the patient. The patient was provided an opportunity to ask questions and all were answered. The patient agreed with the plan and demonstrated an understanding of the  instructions.  Patient advised to call back or seek an in-person evaluation if the symptoms or condition worsens.  Duration of encounter: 30 minutes.  Note by: [name], MD Date: 11/13/2019; Time: 2:56 PM

## 2019-11-13 NOTE — Progress Notes (Signed)
Nursing Pain Medication Assessment:  Safety precautions to be maintained throughout the outpatient stay will include: orient to surroundings, keep bed in low position, maintain call bell within reach at all times, provide assistance with transfer out of bed and ambulation.  Medication Inspection Compliance: Pill count conducted under aseptic conditions, in front of the patient. Neither the pills nor the bottle was removed from the patient's sight at any time. Once count was completed pills were immediately returned to the patient in their original bottle.  Medication: Oxycodone IR Pill/Patch Count: 6 of 150 pills remain Pill/Patch Appearance: Markings consistent with prescribed medication Bottle Appearance: Standard pharmacy container. Clearly labeled. Filled Date: 07 / 27 / 2021 Last Medication intake:  Today

## 2019-11-26 ENCOUNTER — Encounter: Payer: Self-pay | Admitting: Cardiovascular Disease

## 2019-11-26 ENCOUNTER — Ambulatory Visit (INDEPENDENT_AMBULATORY_CARE_PROVIDER_SITE_OTHER): Payer: Medicare Other | Admitting: Cardiovascular Disease

## 2019-11-26 ENCOUNTER — Other Ambulatory Visit: Payer: Self-pay

## 2019-11-26 VITALS — BP 140/82 | HR 73 | Ht 68.0 in | Wt 120.5 lb

## 2019-11-26 DIAGNOSIS — I2510 Atherosclerotic heart disease of native coronary artery without angina pectoris: Secondary | ICD-10-CM | POA: Diagnosis not present

## 2019-11-26 DIAGNOSIS — I10 Essential (primary) hypertension: Secondary | ICD-10-CM | POA: Diagnosis not present

## 2019-11-26 DIAGNOSIS — I5022 Chronic systolic (congestive) heart failure: Secondary | ICD-10-CM | POA: Diagnosis not present

## 2019-11-26 DIAGNOSIS — Z720 Tobacco use: Secondary | ICD-10-CM | POA: Diagnosis not present

## 2019-11-26 NOTE — Patient Instructions (Signed)
Medication Instructions:  Your physician recommends that you continue on your current medications as directed. Please refer to the Current Medication list given to you today.  *If you need a refill on your cardiac medications before your next appointment, please call your pharmacy*   Lab Work: None ordered If you have labs (blood work) drawn today and your tests are completely normal, you will receive your results only by: . MyChart Message (if you have MyChart) OR . A paper copy in the mail If you have any lab test that is abnormal or we need to change your treatment, we will call you to review the results.   Testing/Procedures: None ordered   Follow-Up: At [facility], you and your health needs are our priority.  As part of our continuing mission to provide you with exceptional heart care, we have created designated Provider Care Teams.  These Care Teams include your primary Cardiologist (physician) and Advanced Practice Providers (APPs -  Physician Assistants and Nurse Practitioners) who all work together to provide you with the care you need, when you need it.  We recommend signing up for the patient portal called "MyChart".  Sign up information is provided on this After Visit Summary.  MyChart is used to connect with patients for Virtual Visits (Telemedicine).  Patients are able to view lab/test results, encounter notes, upcoming appointments, etc.  Non-urgent messages can be sent to your provider as well.   To learn more about what you can do with MyChart, go to [email or web address].    Your next appointment:   6 month(s)  The format for your next appointment:   In Person  Provider:    You may see Dr. Arida or one of the following Advanced Practice Providers on your designated Care Team:    Christopher Berge, NP  Ryan Dunn, PA-C  Jacquelyn Visser, PA-C    Other Instructions N/A  

## 2019-11-26 NOTE — Progress Notes (Signed)
Cardiology Office Note   Date:  11/26/2019   ID:  [name], DOB 11/26/1940, MRN [id number]  PCP:  [name], MD  Cardiologist:   Kathlyn Sacramento, MD   Chief Complaint  Patient presents with  . OTHER    6 month f/u no complaints today. Meds reviewed verbally with pt.      History of Present Illness:  [name] is a 79 y.o. female who presents for a follow-up visit regarding chronic systolic heart failure due to nonischemic cardiomyopathy.  Previous ejection fraction was 20% in 2015.  She underwent cardiac catheterization in January 2016 which showed mild nonobstructive coronary artery disease with ejection fraction of 30%.She underwent ICD CRT placement by Dr. Caryl Comes in 04/2014.  Subsequent echocardiogram showed normal LV systolic function.  Most recent echocardiogram in June of this year showed an EF of 60 to 65% with no evidence of pulmonary hypertension. She has known history of bladder cancer that was treated. She reports chronic back pain which limits her activities. She continues to smoke 10 cigarettes per day.  Previous ABI was normal.  She has been doing well with no recent chest pain, shortness of breath or palpitations.  Biggest issue continues to be chronic low back pain and she continues to go to the pain clinic.   Past Medical History:  Diagnosis Date  . AICD (automatic cardioverter/defibrillator) present EP cardiologist--- dr Caryl Comes    placement 05-07-2014 , ef 25%,  NICM/  (02-11-2019 last echo 08-31-2018 ef 60-65%)  . Arthritis    "in about all my joints; for sure in my back"  . Bladder cancer [facility]) urologist-  dr Junious Silk   dx 1995--  recurrent bladder cancer 2015 , s/p TURBT's and chemo instillation's ;   04/ 2019  s/p TURBT  . Chronic hyponatremia   . Chronic low back pain with bilateral sciatica    s/p  spinal cord stimulator @ [facility]  10-09-2018  . Chronic systolic (congestive) heart failure [facility])    cardiologist-  dr Rogue Jury  . COPD with  emphysema ([place])    (02-11-2019  per pt has never been on oxygen)  . Coronary artery disease cardiologist-  dr Kathlyn Sacramento   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% per cardiac cath 03-03-2014  . DDD (degenerative disc disease), thoracolumbar   . Frequent urination   . Full dentures   . Gait instability    due to chronic low back pain, uses roller walker  . GERD (gastroesophageal reflux disease)   . History of iron deficiency anemia 11/2013   resolved w/ IV Iron  (02-11-2019  per pt has not had any issues since 2015)  . History of stomach ulcers 11/2013  . Hypertension   . LBBB (left bundle branch block)   . NICM (nonischemic cardiomyopathy) ([place]) last echo 12-31-2015 ef 55-60%   dx 09/ 2015 per echo 20%;  myoview 09/ 2015 ef 28%;  per cardiac cath 12/ 2015 ef 30%;    . Nocturia more than twice per night   . S/P insertion of spinal cord stimulator followed by [facility]---- Dr Pecola Leisure (notes in care everywhere)   10-09-2018  @[facility] --- thoracic spinal cord stimular/ generator  (device from [facility])---- per pt has a control  . Scoliosis     Past Surgical History:  Procedure Laterality Date  . BI-VENTRICULAR IMPLANTABLE CARDIOVERTER DEFIBRILLATOR N/A 05/07/2014   Procedure: BI-VENTRICULAR IMPLANTABLE CARDIOVERTER DEFIBRILLATOR  (CRT-D);  Surgeon: Deboraha Sprang, MD;  Location: [facility]  CATH LAB;  Service: Cardiovascular;  Laterality: N/A;  . CARDIAC CATHETERIZATION  03-03-2014  dr Kathlyn Sacramento   [facility]   pLAD 20%, pRCA 20%, dRCA 50%, RPLS 50%;  ef 30%, mild elevated LVEDP, mild gradient across aortic valve LVOT  . CARDIOVASCULAR STRESS TEST  11/25/2013   High risk nuclear study w/ large high severity inferior wall perfusion defect on stress and rest images, large mild severity anteroseptal wall perfusion defect on stress and rest images, No inducible ischemia/ global moderate hypokinesis, ef 28%  . CATARACT EXTRACTION W/ INTRAOCULAR LENS  IMPLANT, BILATERAL Bilateral right 12-2013 / left  02-2014  .  CYSTOSCOPY W/ RETROGRADES Bilateral 01/06/2015   Procedure: CYSTOSCOPY WITH  BLADDER BIOPSY BILATERAL RETROGRADE PYELOGRAM,INSTILLATION OF MITOMYCIN C;  Surgeon: [name], MD;  Location: [facility];  Service: Urology;  Laterality: Bilateral;  . CYSTOSCOPY WITH BIOPSY N/A 10/06/2015   Procedure: CYSTO WITH BLADDER BIOPSY, FULGERATION, CHEMO IRRIGATION EPIRUBICIN IN PACU;  Surgeon: [name], MD;  Location: [facility];  Service: Urology;  Laterality: N/A;  . CYSTOSCOPY WITH BIOPSY N/A 02/12/2019   Procedure: CYSTOSCOPY WITH BIOPSY/ FULGURATION/ INSTILLATION OF GEMCITABINE, bilateral retrograde turbt greater 5cm;  Surgeon: [name], MD;  Location: [facility];  Service: Urology;  Laterality: N/A;  . CYSTOSCOPY WITH FULGERATION N/A 06/30/2017   Procedure: Marland Kitchen CYSTOSCOPY WITH [place] FULGERATION/ BLADDER BIOPSY/ INSTILLATION OF EPIRUBICIN;  Surgeon: [name], MD;  Location: [facility];  Service: Urology;  Laterality: N/A;  . CYSTOSCOPY WITH RETROGRADE PYELOGRAM, URETEROSCOPY AND STENT PLACEMENT Bilateral 04/18/2014   Procedure: CYSTOSCOPY WITH RETROGRADE PYELOGRAM;  Surgeon: [name], MD;  Location: [facility];  Service: Urology;  Laterality: Bilateral;  . ESOPHAGOGASTRODUODENOSCOPY N/A 11/28/2013   Procedure: ESOPHAGOGASTRODUODENOSCOPY (EGD);  Surgeon: Arta Silence, MD;  Location: [facility] ENDOSCOPY;  Service: Endoscopy;  Laterality: N/A;  . LUMBAR [place]  1980's   "ruptured disc"  . 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 TRANSTHORACIC ECHOCARDIOGRAM  12/31/2015   dr Caryl Comes   ef 00-867, grade 1 diastolic dysfunction/ mild MR/ septal motion showed abnormal function and dyssynergy  . TRANSURETHRAL RESECTION OF BLADDER  1995  . TRANSURETHRAL RESECTION OF BLADDER TUMOR N/A 12/13/2013   Procedure: TRANSURETHRAL RESECTION OF BLADDER TUMOR  (TURBT);  Surgeon: [name], MD;  Location: [facility];  Service: Urology;  Laterality: N/A;  . TRANSURETHRAL RESECTION OF BLADDER TUMOR N/A 01/17/2014   Procedure: TRANSURETHRAL RESECTION OF BLADDER TUMOR (TURBT);  Surgeon: [name], MD;  Location: [facility];  Service: Urology;  Laterality: N/A;  . TRANSURETHRAL RESECTION OF BLADDER TUMOR N/A 04/18/2014   Procedure: TRANSURETHRAL RESECTION OF BLADDER TUMOR (TURBT), CYSTOGRAM;  Surgeon: [name], MD;  Location: [facility];  Service: Urology;  Laterality: N/A;     Current Outpatient Medications  Medication Sig Dispense Refill  . acetaminophen (TYLENOL) 500 MG tablet Take 500 mg by mouth every 8 (eight) hours as needed.    Marland Kitchen amitriptyline (ELAVIL) 10 MG tablet Take 1 tablet (10 mg total) by mouth at bedtime. 30 tablet 2  . amLODipine (NORVASC) 2.5 MG tablet Take 1 tablet (2.5 mg total) by mouth daily. 90 tablet 3  . gabapentin (NEURONTIN) 300 MG capsule Three times daily as needed for back pain 270 capsule 1  . losartan (COZAAR) 100 MG tablet TAKE 1 TABLET(100 MG) BY MOUTH AT BEDTIME 90 tablet 0  . metoprolol succinate (TOPROL-XL) 25 MG 24  hr tablet Take 1 tablet (25 mg total) by mouth daily. 90 tablet 3  . oxyCODONE-acetaminophen (PERCOCET) 10-325 MG tablet Take 1 tablet by mouth every 4 (four) hours as needed for pain. Must last 30 days. 150 tablet 0  . pantoprazole (PROTONIX) 40 MG tablet Take 1 tablet (40 mg total) by mouth daily. 30 tablet 3  . polyethylene glycol (MIRALAX / GLYCOLAX) packet Take 17 g by mouth daily as needed for mild constipation.     . promethazine (PHENERGAN) 12.5 MG tablet Take 1 tablet (12.5 mg total) by mouth every 8 (eight) hours as needed for nausea or vomiting. 30 tablet 2  . rosuvastatin (CRESTOR) 5 MG tablet Take 1 tablet (5 mg total) by mouth daily. 90 tablet 0  . sucralfate (CARAFATE) 1 g tablet Take one tablet one hour before each meal and 2 hours AFTER other medications 90 tablet 1  . [START ON  12/14/2019] oxyCODONE-acetaminophen (PERCOCET) 10-325 MG tablet Take 1 tablet by mouth every 4 (four) hours as needed for pain. Must last 30 days. (Patient not taking: Reported on 11/26/2019) 150 tablet 0  . [START ON 01/13/2020] oxyCODONE-acetaminophen (PERCOCET) 10-325 MG tablet Take 1 tablet by mouth every 4 (four) hours as needed for pain. Must last 30 days. (Patient not taking: Reported on 11/26/2019) 150 tablet 0   No current facility-administered medications for this visit.   Facility-Administered Medications Ordered in Other Visits  Medication Dose Route Frequency Provider Last Rate Last Admin  . epirubicin (ELLENCE) 50 mg in sodium chloride 0.9 % bladder instillation  50 mg Bladder Instillation Once [name], MD      . gemcitabine [facility]) chemo syringe for bladder instillation 2,000 mg  2,000 mg Bladder Instillation Once [name], MD        Allergies:   Patient has no known allergies.    Social History:  The patient  reports that she has been smoking cigarettes. She has a 28.00 pack-year smoking history. She has never used smokeless tobacco. She reports that she does not drink alcohol and does not use drugs.   Family History:  The patient's family history includes Cancer in her brother and mother.    ROS:  Please see the history of present illness.   Otherwise, review of systems are positive for none.   All other systems are reviewed and negative.    PHYSICAL EXAM: VS:  BP 140/82 (BP Location: Left Arm, Patient Position: Sitting, Cuff Size: Normal)   Pulse 73   Ht 5\' 8"  (1.727 m)   Wt 120 lb 8 oz (54.7 kg)   SpO2 96%   BMI 18.32 kg/m  , BMI Body mass index is 18.32 kg/m. GEN: Well nourished, well developed, in no acute distress  HEENT: normal  Neck: no JVD, carotid bruits, or masses Cardiac: RRR; no murmurs, rubs, or gallops,no edema  Respiratory:  clear to auscultation bilaterally, normal work of breathing GI: soft, nontender, nondistended, + BS MS: no  deformity or atrophy  Skin: warm and dry, no rash Neuro:  Strength and sensation are intact Psych: euthymic mood, full affect   EKG:  EKG is  ordered today. EKG showed atrial sensed ventricular paced rhythm with biventricular pacing.   Recent Labs: 06/03/2019: Magnesium 2.3 06/17/2019: Hemoglobin 13.8; Platelets 162 08/27/2019: ALT 32; BUN 13; Creatinine, Ser 0.64; Potassium 4.7; Sodium 130    Lipid Panel    Component Value Date/Time   CHOL 120 01/11/2019 1349   TRIG 76.0 01/11/2019 1349   HDL 56.90  01/11/2019 1349   CHOLHDL 2 01/11/2019 1349   VLDL 15.2 01/11/2019 1349   LDLCALC 48 01/11/2019 1349      Wt Readings from Last 3 Encounters:  11/26/19 120 lb 8 oz (54.7 kg)  11/13/19 120 lb (54.4 kg)  09/20/19 122 lb 3.2 oz (55.4 kg)       ASSESSMENT AND PLAN:  1.  Chronic systolic heart failure: Due to nonischemic cardiomyopathy. Currently New York Heart Association class II.  Most recent EF was 60 to 65%.  Continue treatment with losartan and Toprol.   2. Tobacco use: I had a prolonged discussion with her about the importance of smoking cessation.  3. Status post ICD/CRT placement: Followed by Dr. Caryl Comes.  4. Essential hypertension: Blood pressure is reasonably controlled.  Disposition:   FU with me in 6 months  Signed,  Kathlyn Sacramento, MD  11/26/2019 3:28 PM    [place]

## 2019-11-27 ENCOUNTER — Ambulatory Visit (INDEPENDENT_AMBULATORY_CARE_PROVIDER_SITE_OTHER): Payer: Medicare Other | Admitting: *Deleted

## 2019-11-27 DIAGNOSIS — C678 Malignant neoplasm of overlapping sites of bladder: Secondary | ICD-10-CM | POA: Diagnosis not present

## 2019-11-27 DIAGNOSIS — I428 Other cardiomyopathies: Secondary | ICD-10-CM | POA: Diagnosis not present

## 2019-11-28 LAB — CUP PACEART REMOTE DEVICE CHECK
Battery Remaining Longevity: 18 mo
Battery Voltage: 2.87 V
Brady Statistic AP VP Percent: 56.4 %
Brady Statistic AP VS Percent: 1.21 %
Brady Statistic AS VP Percent: 40.35 %
Brady Statistic AS VS Percent: 2.04 %
Brady Statistic RA Percent Paced: 52.53 %
Brady Statistic RV Percent Paced: 87.07 %
Date Time Interrogation Session: 20210908224225
HighPow Impedance: 71 Ohm
Implantable Lead Implant Date: 20160217
Implantable Lead Implant Date: 20160217
Implantable Lead Implant Date: 20160217
Implantable Lead Location: 753858
Implantable Lead Location: 753859
Implantable Lead Location: 753860
Implantable Lead Model: 4298
Implantable Lead Model: 5076
Implantable Pulse Generator Implant Date: 20160217
Lead Channel Impedance Value: 399 Ohm
Lead Channel Impedance Value: 399 Ohm
Lead Channel Impedance Value: 399 Ohm
Lead Channel Impedance Value: 456 Ohm
Lead Channel Impedance Value: 475 Ohm
Lead Channel Impedance Value: 513 Ohm
Lead Channel Impedance Value: 551 Ohm
Lead Channel Impedance Value: 551 Ohm
Lead Channel Impedance Value: 722 Ohm
Lead Channel Impedance Value: 817 Ohm
Lead Channel Impedance Value: 874 Ohm
Lead Channel Impedance Value: 893 Ohm
Lead Channel Impedance Value: 988 Ohm
Lead Channel Pacing Threshold Amplitude: 0.5 V
Lead Channel Pacing Threshold Amplitude: 0.75 V
Lead Channel Pacing Threshold Amplitude: 1.25 V
Lead Channel Pacing Threshold Pulse Width: 0.4 ms
Lead Channel Pacing Threshold Pulse Width: 0.4 ms
Lead Channel Pacing Threshold Pulse Width: 0.4 ms
Lead Channel Sensing Intrinsic Amplitude: 2.125 mV
Lead Channel Sensing Intrinsic Amplitude: 2.125 mV
Lead Channel Sensing Intrinsic Amplitude: 22 mV
Lead Channel Sensing Intrinsic Amplitude: 22 mV
Lead Channel Setting Pacing Amplitude: 2 V
Lead Channel Setting Pacing Amplitude: 2.25 V
Lead Channel Setting Pacing Amplitude: 2.5 V
Lead Channel Setting Pacing Pulse Width: 0.4 ms
Lead Channel Setting Pacing Pulse Width: 0.4 ms
Lead Channel Setting Sensing Sensitivity: 0.45 mV

## 2019-11-28 NOTE — Progress Notes (Signed)
Remote ICD transmission.   

## 2019-12-02 ENCOUNTER — Other Ambulatory Visit: Payer: Self-pay | Admitting: Urology

## 2019-12-02 ENCOUNTER — Other Ambulatory Visit: Payer: Self-pay | Admitting: Internal Medicine

## 2019-12-10 ENCOUNTER — Encounter: Payer: Self-pay | Admitting: Internal Medicine

## 2019-12-10 ENCOUNTER — Encounter (HOSPITAL_BASED_OUTPATIENT_CLINIC_OR_DEPARTMENT_OTHER): Payer: Self-pay | Admitting: Urology

## 2019-12-10 ENCOUNTER — Other Ambulatory Visit: Payer: Medicare Other

## 2019-12-10 ENCOUNTER — Other Ambulatory Visit: Payer: Self-pay

## 2019-12-10 ENCOUNTER — Other Ambulatory Visit
Admission: RE | Admit: 2019-12-10 | Discharge: 2019-12-10 | Disposition: A | Discharge status: Home / Self Care | Payer: Medicare Other | Source: Ambulatory Visit | Attending: Urology | Admitting: Urology

## 2019-12-10 DIAGNOSIS — Z20822 Contact with and (suspected) exposure to covid-19: Secondary | ICD-10-CM | POA: Insufficient documentation

## 2019-12-10 DIAGNOSIS — Z01812 Encounter for preprocedural laboratory examination: Secondary | ICD-10-CM | POA: Insufficient documentation

## 2019-12-10 NOTE — Progress Notes (Addendum)
ADDENDUM:  Chart reviewed by anesthesia, Bianca Face PA, [name] stable last cardiology visit and procedure done in November [name] had no issues.   Spoke w/ via phone for pre-op interview--- [name] Lab needs dos----Istat              Lab results------ current ekg in epic/ chart COVID test ------ 12-10-2019 @[facility]  Arrive at ------- 0700 NPO after MN NO Solid Food.  Clear liquids from MN until--- 0600 Medications to take morning of surgery ----- Toprol, Norvasc, and if needed may take phenergan, oxycodone, gabapentin Diabetic medication ----- n/a Patient Special Instructions ----- asked to bring spinal cord stimulator control with her dos Pre-Op special Istructions ----- requested and received device orders in epic and placed copy in chart Patient verbalized understanding of instructions that were given at this phone interview. Patient denies shortness of breath, chest pain, fever, cough at this phone interview.   Anesthesia Review:  AICD 02/ 2016 w/ ef 25%, current ef 60-65% per echo.  Non-obstructive CAD,  Chronic systolic CHF,  COPD/ emphysema (still smokes) never used oxygen, CLBP s/p spinal cord stimulator 10-09-2018 ([name] to bring control).  [name] denies any cardiac/ stroke s&s other than sob with exertion which is normal for her.  Chart to be reviewed by anesthesia, Konrad Felix PA.  PCP:  Dr Deborra Medina (lov 09-20-2019 epic)   Cardiologist : Dr Otilio Carpen (lov 11-26-2019 epic) EP:  Dr Caryl Comes (09-19-2019 epic)  [facility]: Dr Pecola Leisure Cassell Clement 11-13-2019  care everywhere)  Chest x-ray :  05-08-2014 epic EKG : 11-26-2019 epic Echo : 08-31-2018 epic Pacer check:  11-27-2019 epic Stress test: 11-26-2013 epic Cardiac Cath :  02-21-2014 epic Activity level:  See above Sleep Study/ CPAP :  NO Fasting Blood Sugar :      / Checks Blood Sugar -- times a day:   N/A Blood Thinner/ Instructions Maryjane Hurter Dose: NO ASA / Instructions/ Last Dose : NO

## 2019-12-10 NOTE — Progress Notes (Unsigned)
PERIOPERATIVE PRESCRIPTION FOR IMPLANTED CARDIAC DEVICE PROGRAMMING  Patient Information: Name:  [name]  DOB:  07-20-1940  MRN:  [id number]  {TIP - You do not have to delete this tip  -  Copy the info from the staff message sent by the PAT staff  then press F2 here and paste the information using CTL - V on the next line :[id number]}  Planned Procedure: transurethral resection of bladder tumor  Surgeon: dr Junious Silk  Date of Procedure: 12-13-2019  Cautery will be used.  Position during surgery: lithotomy   Please send documentation back to:  [facility] (Fax # [phone])   Device Information:  Clinic EP Physician:  Virl Axe, MD   Device Type:  Defibrillator Manufacturer and Phone #:  [employer]: [phone] Pacemaker Dependent?:  Yes.   Date of Last Device Check:  11/27/19 virtual  Normal Device Function?:  Yes.    Electrophysiologist's Recommendations:   Have magnet available.  Provide continuous ECG monitoring when magnet is used or reprogramming is to be performed.   Procedure should not interfere with device function.  No device programming or magnet placement needed.  Per Device Clinic Standing Orders, Simone Curia, RN  11:43 AM 12/10/2019

## 2019-12-11 LAB — SARS CORONAVIRUS 2 (TAT 6-24 HRS): SARS Coronavirus 2: NEGATIVE

## 2019-12-12 NOTE — H&P (Signed)
Office Visit Report     11/27/2019   --------------------------------------------------------------------------------   Bianca Reichmann. Anderson  MRN: [id number]  DOB: 1940-08-28, 79 year old Female  SSN: [id number]   PRIMARY CARE:  Robert L. Maceo Pro, MD  REFERRING:  Bianca Dover, MD  PROVIDER:  [name], M.D.  LOCATION:  [facility] [phone]     --------------------------------------------------------------------------------   CC: I have bladder cancer.  HPI: [name] is a 79 year-old female established patient who is here for bladder cancer.  Her problem was diagnosed approximately 08/19/2013. The bladder cancer was found because of blood in her urine.   Her bladder cancer was treated by removal with scope. Patient denies removal of the entire bladder, radiation, and chemotherapy.   Her last cysto was 05/28/2019.   Her last U/S or CT Scan was 09/19/2016.   History of bladder cancer in 1995 when she saw Dr. Leory Plowman. Recurrence noted in 2015.   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  -Oct 2016 bbx/turbt - multifocal LG Ta (mmc in pacu);  -Jul 2017 Cysto, bbx - LG Ta - [facility] in PACU  -Apr 2019 small HG and LG Ta recurrence + epirubicin in PACU  -Dec 2019 - fulguration of small posterior recurrence  -Nov 2020 mf HG Ta  -Jun 2021 cysto erythema, cytology negative   -Last upper tract - 11/20 RGPs, 07/18 CT   Intravesical tx: Jan 2021 two cycles Gemzar with Dr. Grayland Ormond (Covid, bacteriuria and LUTS complicated care)  BCG maintenance Jan 2020, reintroduced BCG Jan 2017 x 6   She underwent. She could only hold 30-40 minutes. She is well today.     ALLERGIES: No Allergies    MEDICATIONS: Metoprolol Succinate 25 mg tablet, extended release 24 hr  Amlodipine Besylate 2.5 mg tablet  Carafate  Elavil  Gabapentin  Losartan Potassium 100 mg tablet  Miralax 17 gram/dose powder 0 Oral  Oxycodone-Acetaminophen 10 mg-325  mg tablet  Pantoprazole Sodium  Phenergan     GU PSH: Bladder Instill AntiCA Agent - 02/12/2019, 2020, 2020, 03/09/2018, 2019, 2019, 2019, 2019, 2018, 2018, 2018, 2018, 2018, 2018, 2017, 2017, 2017, 2017, 2016, 2016 Cystoscopy - 08/20/2019, 05/15/2019, 01/18/2019, 09/10/2018, 2020, 01/22/2018, 2019, 2019, 2018, 2018, 2017, 2017 Cystoscopy Fulguration - 02/21/2018 Cystoscopy TURBT >5 cm - 2016, 2015, 2015 Cystoscopy TURBT 2-5 cm - 02/12/2019, 2019, 2017, 2016 Locm 300-399Mg /Ml Iodine,1Ml - 2018       PSH Notes: Bladder Injection Of Cancer Treatment, Cystoscopy With Fulguration Medium Lesion (2-5cm), Cystoscopy With Fulguration Large Lesion (Over 5cm), Bladder Injection Of Cancer Treatment, Cystoscopy With Fulguration Large Lesion (Over 5cm), Bladder Surgery, Back Surgery, Cystoscopy With Fulguration Large Lesion (Over 5cm)   NON-GU PSH: None   GU PMH: Dysuria (Stable) - 05/28/2019, - 2018 Incomplete bladder emptying - 05/28/2019 Urinary Frequency (Stable), increase solifenacin to 10 mg - 05/15/2019, - 09/10/2018 Bladder Cancer overlapping sites - 01/18/2019, Malignant neoplasm of overlapping sites of bladder, - 2017 History of bladder cancer (Stable) - 2020, History of bladder cancer, - 2015 Bladder Cancer Lateral - 01/22/2018, - 2019, - 2019, - 2018, - 2018, - 2017, - 2017, - 2017, Malignant neoplasm of lateral wall of urinary bladder, - 2016 Bladder Cancer Dome, Malignant neoplasm of dome of bladder - 2016 Bladder Cancer Posterior, Malignant neoplasm of posterior wall of urinary bladder - 2015 Gross hematuria, Gross hematuria - 2015 Urethral Cancer, Malignant neoplasm of bladder neck - 2015  NON-GU PMH: Encounter for general adult medical examination without abnormal findings, Encounter for preventive health examination - 2017 Bacteriuria, Bacteriuria, asymptomatic - 2016 Other constipation, Chronic constipation - 2015 Personal history of other diseases of the circulatory system, History of  cardiac disorder - 2015, History of cardiac arrhythmia, - 2015, History of hypertension, - 2015 Personal history of other diseases of the digestive system, History of gastric ulcer - 2015 Personal history of other diseases of the musculoskeletal system and connective tissue, History of degenerative disc disease - 2015    FAMILY HISTORY: Colon Cancer - Runs In Family Death - Runs In Family liver cancer - Runs In Family Lung Cancer - Runs In Family   SOCIAL HISTORY: Marital Status: Widowed Preferred Language: English; Ethnicity: Not Hispanic Or Latino; Race: White Current Smoking Status: Patient smokes.  Has never drank.  Does not drink caffeine.     Notes: Current every day smoker, Six children, Alcohol use, Widowed, Caffeine use   REVIEW OF SYSTEMS:    GU Review Female:   Patient denies frequent urination, hard to postpone urination, burning /pain with urination, get up at night to urinate, leakage of urine, stream starts and stops, trouble starting your stream, have to strain to urinate, and being pregnant.  Gastrointestinal (Upper):   Patient denies nausea, vomiting, and indigestion/ heartburn.  Gastrointestinal (Lower):   Patient denies diarrhea and constipation.  Constitutional:   Patient denies fever, night sweats, weight loss, and fatigue.  Skin:   Patient denies skin rash/ lesion and itching.  Eyes:   Patient denies blurred vision and double vision.  Ears/ Nose/ Throat:   Patient denies sore throat and sinus problems.  Hematologic/Lymphatic:   Patient denies swollen glands and easy bruising.  Cardiovascular:   Patient denies leg swelling and chest pains.  Respiratory:   Patient denies cough and shortness of breath.  Endocrine:   Patient denies excessive thirst.  Musculoskeletal:   Patient denies back pain and joint pain.  Neurological:   Patient denies headaches and dizziness.  Psychologic:   Patient denies depression and anxiety.   VITAL SIGNS:      11/27/2019 01:05 PM   Weight 120 lb / 54.43 kg  Height 68 in / 172.72 cm  BP 158/91 mmHg  Pulse 78 /min  Temperature 97.1 F / 36.1 C  BMI 18.2 kg/m   MULTI-SYSTEM PHYSICAL EXAMINATION:    Constitutional: Well-nourished. No physical deformities. Normally developed. Good grooming.  Neck: Neck symmetrical, not swollen. Normal tracheal position.  Respiratory: No labored breathing, no use of accessory muscles.   Cardiovascular: Normal temperature, normal extremity pulses, no swelling, no varicosities.  Neurologic / Psychiatric: Oriented to time, oriented to place, oriented to person. No depression, no anxiety, no agitation.  Gastrointestinal: No mass, no tenderness, no rigidity, non obese abdomen.     PAST DATA REVIEW: None   PROCEDURES:         Flexible Cystoscopy - 52000  Risks, benefits, and some of the potential complications of the procedure were discussed at length with the patient including infection, bleeding, voiding discomfort, urinary retention, fever, chills, sepsis, and others. All questions were answered. Informed consent was obtained. Antibiotic prophylaxis was given. Sterile technique and intraurethral analgesia were used. Chaperone - Hassan Rowan - for exam and cystoscopy.   Meatus:  Normal size. Normal location. Normal condition.  Urethra:  No hypermobility. No leakage.  Ureteral Orifices:  Normal location. Normal size. Normal shape. Effluxed clear urine.  Bladder:  A right lateral wall tumor. A few  posterior wall tumors. No trabeculation. Normal mucosa. No stones.      The lower urinary tract was carefully examined. The procedure was well-tolerated and without complications. Antibiotic instructions were given. Instructions were given to call the office immediately for bloody urine, difficulty urinating, urinary retention, painful or frequent urination, fever, chills, nausea, vomiting or other illness. The patient stated that she understood these instructions and would comply with them.          Urinalysis - 81003 Dipstick Dipstick Cont'd  Color: Yellow Bilirubin: Neg mg/dL  Appearance: Clear Ketones: Neg mg/dL  Specific Gravity: 1.010 Blood: Neg ery/uL  pH: 6.5 Protein: Trace mg/dL  Glucose: Neg mg/dL Urobilinogen: 0.2 mg/dL    Nitrites: Neg    Leukocyte Esterase: Neg leu/uL    ASSESSMENT:      ICD-10 Details  1 GU:   Bladder Cancer overlapping sites - C67.8 Chronic, Worsening - Disc nature r/b/a to TURBT and gemcitabine. She will proceed .    PLAN:           Schedule Return Visit/Planned Activity: Next Available Appointment - Schedule Surgery          Document Letter(s):  Created for Patient: Clinical Summary         Next Appointment:      Next Appointment: 11/27/2019 01:15 PM    Appointment Type: Female Cysto    Location: [facility] [phone]    Provider: [name], M.D.    Reason for Visit: 3 mo cysto      * Signed by [name], M.D. on 11/27/19 at 7:25 PM (EDT)*     The information contained in this medical record document is considered private and confidential patient information. This information can only be used for the medical diagnosis and/or medical services that are being provided by the patient's selected caregivers. This information can only be distributed outside of the patient's care if the patient agrees and signs waivers of authorization for this information to be sent to an outside source or route.

## 2019-12-13 ENCOUNTER — Encounter (HOSPITAL_BASED_OUTPATIENT_CLINIC_OR_DEPARTMENT_OTHER): Payer: Self-pay | Admitting: Urology

## 2019-12-13 ENCOUNTER — Ambulatory Visit (HOSPITAL_BASED_OUTPATIENT_CLINIC_OR_DEPARTMENT_OTHER)
Admission: RE | Admit: 2019-12-13 | Discharge: 2019-12-13 | Disposition: A | Discharge status: Home / Self Care | Payer: Medicare Other | Attending: Urology | Admitting: Urology

## 2019-12-13 ENCOUNTER — Ambulatory Visit (HOSPITAL_BASED_OUTPATIENT_CLINIC_OR_DEPARTMENT_OTHER): Payer: Medicare Other | Admitting: Physician Assistant

## 2019-12-13 ENCOUNTER — Other Ambulatory Visit: Payer: Self-pay

## 2019-12-13 ENCOUNTER — Encounter (HOSPITAL_BASED_OUTPATIENT_CLINIC_OR_DEPARTMENT_OTHER)
Admission: RE | Disposition: A | Discharge status: Home / Self Care | Payer: Self-pay | Source: Home / Self Care | Attending: Urology

## 2019-12-13 DIAGNOSIS — Z95810 Presence of automatic (implantable) cardiac defibrillator: Secondary | ICD-10-CM | POA: Insufficient documentation

## 2019-12-13 DIAGNOSIS — I2510 Atherosclerotic heart disease of native coronary artery without angina pectoris: Secondary | ICD-10-CM | POA: Insufficient documentation

## 2019-12-13 DIAGNOSIS — I110 Hypertensive heart disease with heart failure: Secondary | ICD-10-CM | POA: Insufficient documentation

## 2019-12-13 DIAGNOSIS — I739 Peripheral vascular disease, unspecified: Secondary | ICD-10-CM | POA: Diagnosis not present

## 2019-12-13 DIAGNOSIS — K5909 Other constipation: Secondary | ICD-10-CM | POA: Diagnosis not present

## 2019-12-13 DIAGNOSIS — E871 Hypo-osmolality and hyponatremia: Secondary | ICD-10-CM | POA: Diagnosis not present

## 2019-12-13 DIAGNOSIS — F17200 Nicotine dependence, unspecified, uncomplicated: Secondary | ICD-10-CM | POA: Insufficient documentation

## 2019-12-13 DIAGNOSIS — D090 Carcinoma in situ of bladder: Secondary | ICD-10-CM | POA: Diagnosis not present

## 2019-12-13 DIAGNOSIS — C678 Malignant neoplasm of overlapping sites of bladder: Secondary | ICD-10-CM | POA: Insufficient documentation

## 2019-12-13 DIAGNOSIS — Z79899 Other long term (current) drug therapy: Secondary | ICD-10-CM | POA: Diagnosis not present

## 2019-12-13 DIAGNOSIS — E785 Hyperlipidemia, unspecified: Secondary | ICD-10-CM | POA: Diagnosis not present

## 2019-12-13 DIAGNOSIS — I499 Cardiac arrhythmia, unspecified: Secondary | ICD-10-CM | POA: Insufficient documentation

## 2019-12-13 DIAGNOSIS — Z8551 Personal history of malignant neoplasm of bladder: Secondary | ICD-10-CM | POA: Insufficient documentation

## 2019-12-13 DIAGNOSIS — J449 Chronic obstructive pulmonary disease, unspecified: Secondary | ICD-10-CM | POA: Insufficient documentation

## 2019-12-13 DIAGNOSIS — K219 Gastro-esophageal reflux disease without esophagitis: Secondary | ICD-10-CM | POA: Insufficient documentation

## 2019-12-13 DIAGNOSIS — I509 Heart failure, unspecified: Secondary | ICD-10-CM | POA: Diagnosis not present

## 2019-12-13 DIAGNOSIS — D494 Neoplasm of unspecified behavior of bladder: Secondary | ICD-10-CM | POA: Diagnosis not present

## 2019-12-13 HISTORY — PX: TRANSURETHRAL RESECTION OF BLADDER TUMOR: SHX2575

## 2019-12-13 LAB — POCT I-STAT, CHEM 8
BUN: 10 mg/dL (ref 8–23)
Calcium, Ion: 1.3 mmol/L (ref 1.15–1.40)
Chloride: 96 mmol/L — ABNORMAL LOW (ref 98–111)
Creatinine, Ser: 0.7 mg/dL (ref 0.44–1.00)
Glucose, Bld: 89 mg/dL (ref 70–99)
HCT: 47 % — ABNORMAL HIGH (ref 36.0–46.0)
Hemoglobin: 16 g/dL — ABNORMAL HIGH (ref 12.0–15.0)
Potassium: 3.6 mmol/L (ref 3.5–5.1)
Sodium: 137 mmol/L (ref 135–145)
TCO2: 28 mmol/L (ref 22–32)

## 2019-12-13 SURGERY — TURBT (TRANSURETHRAL RESECTION OF BLADDER TUMOR)
Anesthesia: General | Site: Bladder

## 2019-12-13 MED ORDER — CEFAZOLIN SODIUM-DEXTROSE 2-4 GM/100ML-% IV SOLN
INTRAVENOUS | Status: AC
  Filled 2019-12-13: qty 100

## 2019-12-13 MED ORDER — SODIUM CHLORIDE 0.9 % IR SOLN
Status: DC | PRN
  Administered 2019-12-13: 2700 mL via INTRAVESICAL
  Administered 2019-12-13: 6000 mL via INTRAVESICAL

## 2019-12-13 MED ORDER — LIDOCAINE 2% (20 MG/ML) 5 ML SYRINGE
INTRAMUSCULAR | Status: AC
  Filled 2019-12-13: qty 5

## 2019-12-13 MED ORDER — DEXAMETHASONE SODIUM PHOSPHATE 10 MG/ML IJ SOLN
INTRAMUSCULAR | Status: DC | PRN
  Administered 2019-12-13: 10 mg via INTRAVENOUS

## 2019-12-13 MED ORDER — LIDOCAINE HCL URETHRAL/MUCOSAL 2 % EX GEL
CUTANEOUS | Status: DC | PRN
  Administered 2019-12-13: 1 via TOPICAL

## 2019-12-13 MED ORDER — FENTANYL CITRATE (PF) 100 MCG/2ML IJ SOLN
25.0000 ug | INTRAMUSCULAR | Status: DC | PRN
  Administered 2019-12-13: 25 ug via INTRAVENOUS

## 2019-12-13 MED ORDER — FENTANYL CITRATE (PF) 100 MCG/2ML IJ SOLN
INTRAMUSCULAR | Status: AC
  Filled 2019-12-13: qty 2

## 2019-12-13 MED ORDER — LIDOCAINE 2% (20 MG/ML) 5 ML SYRINGE
INTRAMUSCULAR | Status: DC | PRN
  Administered 2019-12-13: 80 mg via INTRAVENOUS

## 2019-12-13 MED ORDER — ONDANSETRON HCL 4 MG/2ML IJ SOLN
INTRAMUSCULAR | Status: DC | PRN
  Administered 2019-12-13: 4 mg via INTRAVENOUS

## 2019-12-13 MED ORDER — PROPOFOL 10 MG/ML IV BOLUS
INTRAVENOUS | Status: AC
  Filled 2019-12-13: qty 20

## 2019-12-13 MED ORDER — ONDANSETRON HCL 4 MG/2ML IJ SOLN
INTRAMUSCULAR | Status: AC
  Filled 2019-12-13: qty 2

## 2019-12-13 MED ORDER — GEMCITABINE CHEMO FOR BLADDER INSTILLATION 2000 MG
2000.0000 mg | Freq: Once | INTRAVENOUS | Status: AC
  Administered 2019-12-13: 2000 mg via INTRAVESICAL
  Filled 2019-12-13: qty 52.6

## 2019-12-13 MED ORDER — PHENYLEPHRINE 40 MCG/ML (10ML) SYRINGE FOR IV PUSH (FOR BLOOD PRESSURE SUPPORT)
PREFILLED_SYRINGE | INTRAVENOUS | Status: DC | PRN
  Administered 2019-12-13 (×2): 80 ug via INTRAVENOUS

## 2019-12-13 MED ORDER — DEXAMETHASONE SODIUM PHOSPHATE 10 MG/ML IJ SOLN
INTRAMUSCULAR | Status: AC
  Filled 2019-12-13: qty 1

## 2019-12-13 MED ORDER — ONDANSETRON HCL 4 MG/2ML IJ SOLN: 4.0000 mg | Freq: Once | INTRAMUSCULAR | Status: DC | PRN

## 2019-12-13 MED ORDER — PROPOFOL 10 MG/ML IV BOLUS
INTRAVENOUS | Status: DC | PRN
  Administered 2019-12-13: 140 mg via INTRAVENOUS

## 2019-12-13 MED ORDER — FENTANYL CITRATE (PF) 100 MCG/2ML IJ SOLN
INTRAMUSCULAR | Status: DC | PRN
Start: 2019-12-13 — End: 2019-12-13
  Administered 2019-12-13 (×2): 25 ug via INTRAVENOUS
  Administered 2019-12-13: 50 ug via INTRAVENOUS

## 2019-12-13 MED ORDER — BELLADONNA ALKALOIDS-OPIUM 16.2-60 MG RE SUPP
RECTAL | Status: DC | PRN
Start: 2019-12-13 — End: 2019-12-13
  Administered 2019-12-13: 1 via RECTAL

## 2019-12-13 MED ORDER — LACTATED RINGERS IV SOLN
INTRAVENOUS | Status: DC
  Administered 2019-12-13: 50 mL via INTRAVENOUS

## 2019-12-13 MED ORDER — BELLADONNA ALKALOIDS-OPIUM 16.2-60 MG RE SUPP
RECTAL | Status: AC
  Filled 2019-12-13: qty 1

## 2019-12-13 MED ORDER — CEFAZOLIN SODIUM-DEXTROSE 2-4 GM/100ML-% IV SOLN
2.0000 g | Freq: Once | INTRAVENOUS | Status: AC
  Administered 2019-12-13: 2 g via INTRAVENOUS

## 2019-12-13 SURGICAL SUPPLY — 35 items
BAG DRAIN URO-CYSTO SKYTR STRL (DRAIN) ×3 IMPLANT
BAG DRN RND TRDRP ANRFLXCHMBR (UROLOGICAL SUPPLIES)
BAG DRN UROCATH (DRAIN) ×1
BAG URINE DRAIN 2000ML AR STRL (UROLOGICAL SUPPLIES) IMPLANT
BAG URINE LEG 500ML (DRAIN) IMPLANT
CATH FOLEY 2WAY SLVR  5CC 16FR (CATHETERS) ×3
CATH FOLEY 2WAY SLVR  5CC 20FR (CATHETERS)
CATH FOLEY 2WAY SLVR  5CC 22FR (CATHETERS)
CATH FOLEY 2WAY SLVR 5CC 16FR (CATHETERS) IMPLANT
CATH FOLEY 2WAY SLVR 5CC 20FR (CATHETERS) IMPLANT
CATH FOLEY 2WAY SLVR 5CC 22FR (CATHETERS) IMPLANT
CATH URET 5FR 28IN OPEN ENDED (CATHETERS) IMPLANT
CLOTH BEACON ORANGE TIMEOUT ST (SAFETY) ×3 IMPLANT
DRSG TELFA 3X8 NADH (GAUZE/BANDAGES/DRESSINGS) ×3 IMPLANT
ELECT REM PT RETURN 9FT ADLT (ELECTROSURGICAL) ×3
ELECTRODE REM PT RTRN 9FT ADLT (ELECTROSURGICAL) ×1 IMPLANT
EVACUATOR MICROVAS BLADDER (UROLOGICAL SUPPLIES) IMPLANT
GLOVE BIO SURGEON STRL SZ7.5 (GLOVE) ×3 IMPLANT
GLOVE BIO SURGEON STRL SZ8 (GLOVE) IMPLANT
GLOVE ECLIPSE 6.5 STRL STRAW (GLOVE) ×2 IMPLANT
GOWN STRL REUS W/ TWL LRG LVL3 (GOWN DISPOSABLE) IMPLANT
GOWN STRL REUS W/TWL LRG LVL3 (GOWN DISPOSABLE) ×6 IMPLANT
HOLDER FOLEY CATH W/STRAP (MISCELLANEOUS) IMPLANT
IV NS IRRIG 3000ML ARTHROMATIC (IV SOLUTION) ×6 IMPLANT
KIT TURNOVER CYSTO (KITS) ×3 IMPLANT
LOOP CUT BIPOLAR 24F LRG (ELECTROSURGICAL) ×2 IMPLANT
MANIFOLD NEPTUNE II (INSTRUMENTS) ×3 IMPLANT
NS IRRIG 500ML POUR BTL (IV SOLUTION) ×2 IMPLANT
PACK CYSTO (CUSTOM PROCEDURE TRAY) ×3 IMPLANT
PAD DRESSING TELFA 3X8 NADH (GAUZE/BANDAGES/DRESSINGS) IMPLANT
PLUG CATH AND CAP STER (CATHETERS) IMPLANT
TUBE CONNECTING 12'X1/4 (SUCTIONS) ×1
TUBE CONNECTING 12X1/4 (SUCTIONS) ×2 IMPLANT
TUBING UROLOGY SET (TUBING) ×3 IMPLANT
WATER STERILE IRR 500ML POUR (IV SOLUTION) ×2 IMPLANT

## 2019-12-13 NOTE — Transfer of Care (Signed)
Immediate Anesthesia Transfer of Care Note  Patient: [name]  Procedure(s) Performed: TRANSURETHRAL RESECTION OF BLADDER TUMOR (TURBT) GREATER THAN 5CM WITH CYSTOSCOPY/ POST OPERATIVE INSTILLATION OF GEMCITABINE (N/A Bladder)  Patient Location: PACU  Anesthesia Type:General  Level of Consciousness: awake, alert  and oriented  Airway & Oxygen Therapy: Patient Spontanous Breathing and Patient connected to nasal cannula oxygen  Post-op Assessment: Report given to RN and Post -op Vital signs reviewed and stable  Post vital signs: Reviewed and stable  Last Vitals:  Vitals Value Taken Time  BP    Temp    Pulse 65 12/13/19 1126  Resp 14 12/13/19 1126  SpO2 100 % 12/13/19 1126  Vitals shown include unvalidated device data.  Last Pain:  Vitals:   12/13/19 0725  TempSrc: Oral  PainSc: 0-No pain      Patients Stated Pain Goal: 7 (48/01/65 [id number])  Complications: No complications documented.

## 2019-12-13 NOTE — Anesthesia Preprocedure Evaluation (Signed)
Anesthesia Evaluation  Patient identified by MRN, date of birth, ID band Patient awake    Reviewed: Allergy & Precautions, NPO status , Patient's Chart, lab work & pertinent test results, reviewed documented beta blocker date and time   Airway Mallampati: II  TM Distance: >3 FB Neck ROM: Full    Dental  (+) Edentulous Upper, Edentulous Lower   Pulmonary COPD, Current Smoker and Patient abstained from smoking.,    breath sounds clear to auscultation       Cardiovascular hypertension, Pt. on medications and Pt. on home beta blockers + CAD, + Peripheral Vascular Disease and +CHF  + dysrhythmias + Cardiac Defibrillator  Rhythm:Regular Rate:Normal  Echo 08/2018 1. The left ventricle has normal systolic function with an ejection fraction of 60-65%. The cavity size was normal. Left ventricular diastolic Doppler parameters are consistent with impaired relaxation.  2. The right ventricle has normal systolic function. The cavity was normal. There is no increase in right ventricular wall thickness.Normal RVSP  3. PVCs noted    Pacer/AICD interrogation 11/2019 AS-VS 2%, AS-VP 40.3%, AP-VS 1.2%, AP-VP 56.4%    Neuro/Psych S/p spinal cord stimulator  Neuromuscular disease    GI/Hepatic Neg liver ROS, GERD  ,  Endo/Other  negative endocrine ROS  Renal/GU Bladder CA     Musculoskeletal  (+) Arthritis ,   Abdominal   Peds  Hematology negative hematology ROS (+)   Anesthesia Other Findings   Reproductive/Obstetrics                             Anesthesia Physical  Anesthesia Plan  ASA: III  Anesthesia Plan: General   Post-op Pain Management:    Induction: Intravenous  PONV Risk Score and Plan: 2 and Treatment may vary due to age or medical condition, Dexamethasone and Ondansetron  Airway Management Planned: LMA  Additional Equipment:   Intra-op Plan:   Post-operative Plan: Extubation in  OR  Informed Consent: I have reviewed the patients History and Physical, chart, labs and discussed the procedure including the risks, benefits and alternatives for the proposed anesthesia with the patient or authorized representative who has indicated his/her understanding and acceptance.     Dental advisory given  Plan Discussed with: CRNA  Anesthesia Plan Comments:         Anesthesia Quick Evaluation

## 2019-12-13 NOTE — Discharge Instructions (Signed)
Bladder Biopsy, Care After This sheet gives you information about how to care for yourself after your procedure. Your health care provider may also give you more specific instructions. If you have problems or questions, contact your health care provider.  What can I expect after the procedure? After the procedure, it is common to have:  Mild pain in your bladder or kidney area during urination.  Minor burning during urination.  Small amounts of blood in your urine.  A sudden urge to urinate.  A need to urinate more often than usual. Follow these instructions at home: Medicines  Take over-the-counter and prescription medicines only as told by your health care provider.  If you were prescribed an antibiotic medicine, take it as told by your health care provider. Do not stop taking the antibiotic even if you start to feel better. Activity  Rest if told by your health care provider.  Do not drive for 24 hours if you received a medicine to help you relax (sedative) during your procedure. Ask your health care provider when it is safe for you to drive.  Return to your normal activities as told by your health care provider. Ask your health care provider what activities are safe for you. General instructions   Take a warm bath to relieve any burning sensations around your urethra.  Hold a warm, damp washcloth over the urethral area to ease pain.  It is up to you to get the results of your procedure. Ask your health care provider, or the department that is doing the procedure, when your results will be ready.  Keep all follow-up visits as told by your health care provider. This is important. Contact a health care provider if:  You have a fever.  Your symptoms do not improve within 24 hours, and you continue to have: ? Burning during urination. ? Increasing amounts of blood in your urine. ? Pain during urination. ? An urgent need to urinate. ? A need to urinate more often than  usual. Get help right away if:  You have a lot of bleeding or more bleeding.  You have severe pain.  You are unable to urinate.  You have bright red blood in your urine.  You are passing blood clots in your urine.  You have a fever. Summary  After the procedure, it is common to have mild pain, burning with urination, and some blood.  Take medicines as told. If you were given antibiotics, finish all of it even if you start to feel better.  Rest after the procedure. Follow your health care provider's instructions for self care at home.  Contact a health care provider if your symptoms do not improve within 24 hours, or if you have more pain or more blood in your urine.  Get help right away if you have a lot of bleeding, severe pain, fever, or bright red blood or blood clots in the urine. This information is not intended to replace advice given to you by your health care provider. Make sure you discuss any questions you have with your health care provider. Document Revised: 09/12/2018 Document Reviewed: 09/12/2018 Elsevier Patient Education  [place] Instructions  Activity: Get plenty of rest for the remainder of the day. A responsible individual must stay with you for 24 hours following the procedure.  For the next 24 hours, DO NOT: -Drive a car -Paediatric nurse -Drink alcoholic beverages -Take any medication unless instructed by your physician -Make  any legal decisions or sign important papers.  Meals: Start with liquid foods such as gelatin or soup. Progress to regular foods as tolerated. Avoid greasy, spicy, heavy foods. If nausea and/or vomiting occur, drink only clear liquids until the nausea and/or vomiting subsides. Call your physician if vomiting continues.  Special Instructions/Symptoms: Your throat may feel dry or sore from the anesthesia or the breathing tube placed in your throat during surgery. If this causes discomfort,  gargle with warm salt water. The discomfort should disappear within 24 hours.  If you had a scopolamine patch placed behind your ear for the management of post- operative nausea and/or vomiting:  1. The medication in the patch is effective for 72 hours, after which it should be removed.  Wrap patch in a tissue and discard in the trash. Wash hands thoroughly with soap and water. 2. You may remove the patch earlier than 72 hours if you experience unpleasant side effects which may include dry mouth, dizziness or visual disturbances. 3. Avoid touching the patch. Wash your hands with soap and water after contact with the patch.

## 2019-12-13 NOTE — Anesthesia Procedure Notes (Signed)
Procedure Name: LMA Insertion Date/Time: 12/13/2019 10:27 AM Performed by: Teng Decou D, CRNA Pre-anesthesia Checklist: Patient identified, Emergency Drugs available, Suction available and Patient being monitored Patient Re-evaluated:Patient Re-evaluated prior to induction Oxygen Delivery Method: Circle system utilized Preoxygenation: Pre-oxygenation with 100% oxygen Induction Type: IV induction Ventilation: Mask ventilation without difficulty LMA: LMA inserted LMA Size: 4.0 Tube type: Oral Number of attempts: 1 Placement Confirmation: positive ETCO2 and breath sounds checked- equal and bilateral Tube secured with: Tape Dental Injury: Teeth and Oropharynx as per pre-operative assessment

## 2019-12-13 NOTE — Interval H&P Note (Signed)
History and Physical Interval Note:  12/13/2019 8:06 AM  [name]  has presented today for surgery, with the diagnosis of BLADDER CANCER.  The various methods of treatment have been discussed with the patient and family. After consideration of risks, benefits and other options for treatment, the patient has consented to  Procedure(s): TRANSURETHRAL RESECTION OF BLADDER TUMOR (TURBT) WITH CYSTOSCOPY/ INSTILLATION OF GEMCITABINE (N/A) as a surgical intervention.  The patient's history has been reviewed, patient examined, no change in status, stable for surgery.  I have reviewed the patient's chart and labs.  Questions were answered to the patient's satisfaction.     [name]

## 2019-12-13 NOTE — Op Note (Signed)
Preoperative diagnosis: Bladder neoplasm Postoperative diagnosis: Bladder neoplasm  Procedure: TURBT greater than 5 cm, postoperative instillation of gemcitabine in PACU  Surgeon: Junious Silk  Anesthesia: General  Indication for procedure: Ms. 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  She did well after limited cycles of intravesical gemcitabine but had a multifocal recurrence on last office cystoscopy.  She is brought for above procedures.  Findings: On exam under anesthesia the bladder is palpably normal without mass or lesion.  On cystoscopy there was a carpeting of tumors from the right posterior down the back of the bladder toward the left posterior.  There was also a cluster at the right bladder neck.  Greater than 5 cm fulgurated off the back of the bladder.  These tumors were superficial and would brush off of the surface for the most part.  Because her bladder is thin I ablated many of the tumors with the cut current and then fulgurated them with the coag current. I took two representative biopsy from right BN and left posterior.   Description of procedure: After consent was obtained patient brought to the operating room.  After adequate anesthesia she was placed lithotomy position and prepped and draped in the usual sterile fashion.  Timeout was performed to confirm the patient and procedure.  Resectoscope continuous flow sheath was passed with the visual obturator and the bladder carefully inspected.  I could see the bladder neck well.  I then swapped out for the handle and loop and began at the right superior posterior and ablated and fulgurated all the tumors.  I then brought this down to the midline and then to the left posterior where the carpeting of the small tumors was centered.  In the left posterior where it was safer to get a biopsy one of the tumors was resected and sent to pathology.  Then right bladder neck was approached in a  similar fashion but it was safer to get the resection here and the tumor was sent for pathology.  And the other tumor and the biopsy site was fulgurated.  Bladder carefully inspected and I noted no other mucosal abnormalities and hemostasis was excellent.  The scope was removed and a 16 [place] Foley placed.  She was awakened taken to recovery room in stable condition.  Urine was clear.  Instillation of gemcitabine in PACU: Gemcitabine 2000 mg was instilled per urethra and left indwelling in the bladder for 1 hour and then the bladder was drained.  Foley removed.  Complications: None  Blood loss: Minimal  Specimens to pathology: #1 left posterior bladder tumor #2 right bladder neck tumor  Drains: 16 [place] Foley catheter  Disposition: Patient stable to PACU

## 2019-12-13 NOTE — Anesthesia Postprocedure Evaluation (Signed)
Anesthesia Post Note  Patient: [name]  Procedure(s) Performed: TRANSURETHRAL RESECTION OF BLADDER TUMOR (TURBT) GREATER THAN 5CM WITH CYSTOSCOPY/ POST OPERATIVE INSTILLATION OF GEMCITABINE (N/A Bladder)     Patient location during evaluation: PACU Anesthesia Type: General Level of consciousness: sedated and patient cooperative Pain management: pain level controlled Vital Signs Assessment: post-procedure vital signs reviewed and stable Respiratory status: spontaneous breathing Cardiovascular status: stable Anesthetic complications: no   No complications documented.  Last Vitals:  Vitals:   12/13/19 1245 12/13/19 1300  BP: (!) 147/87 (!) 161/95  Pulse: (!) 59 60  Resp: 11 10  Temp:    SpO2: 94% 93%    Last Pain:  Vitals:   12/13/19 1315  TempSrc:   PainSc: 0-No pain                 Nolon Nations

## 2019-12-15 ENCOUNTER — Other Ambulatory Visit: Payer: Self-pay | Admitting: Student in an Organized Health Care Education/Training Program

## 2019-12-16 ENCOUNTER — Encounter (HOSPITAL_BASED_OUTPATIENT_CLINIC_OR_DEPARTMENT_OTHER): Payer: Self-pay | Admitting: Urology

## 2019-12-17 LAB — SURGICAL PATHOLOGY

## 2019-12-30 ENCOUNTER — Other Ambulatory Visit: Payer: Self-pay | Admitting: Internal Medicine

## 2019-12-31 ENCOUNTER — Other Ambulatory Visit: Payer: Self-pay | Admitting: Student in an Organized Health Care Education/Training Program

## 2020-01-06 ENCOUNTER — Telehealth: Payer: Self-pay | Admitting: Student in an Organized Health Care Education/Training Program

## 2020-01-06 NOTE — Telephone Encounter (Signed)
Patient wants to know if she is till supposed to be taking the amitriptyline

## 2020-01-06 NOTE — Telephone Encounter (Signed)
This was not ordered or addressed on 11/13/2019 appt. Last prescribed 07/2019. Marland Kitchen

## 2020-01-07 NOTE — Telephone Encounter (Signed)
If she finds it helpful for her pain or insomnia then yes.  If she does not find any benefit with it then okay to discontinue.

## 2020-01-07 NOTE — Telephone Encounter (Signed)
Patient states Amitriptyline probably not helpful. She wished to discontinue it.

## 2020-01-13 ENCOUNTER — Other Ambulatory Visit: Payer: Self-pay

## 2020-01-13 ENCOUNTER — Telehealth: Payer: Self-pay

## 2020-01-13 ENCOUNTER — Ambulatory Visit (INDEPENDENT_AMBULATORY_CARE_PROVIDER_SITE_OTHER): Payer: Medicare Other

## 2020-01-13 DIAGNOSIS — Z23 Encounter for immunization: Secondary | ICD-10-CM

## 2020-01-13 NOTE — Telephone Encounter (Signed)
Patient stated she lost her rx for shingrix, pneumonia, and tetanus. She would like a new rx mailed to her. Please advise.

## 2020-01-13 NOTE — Telephone Encounter (Signed)
Ok to print and I will sign

## 2020-01-14 MED ORDER — ZOSTER VAC RECOMB ADJUVANTED 50 MCG/0.5ML IM SUSR: 0.5000 mL | Freq: Once | INTRAMUSCULAR | 1 refills | Status: AC

## 2020-01-14 MED ORDER — TETANUS-DIPHTH-ACELL PERTUSSIS 5-2.5-18.5 LF-MCG/0.5 IM SUSP: 0.5000 mL | Freq: Once | INTRAMUSCULAR | 0 refills | Status: AC

## 2020-01-14 MED ORDER — PNEUMOVAX 23 25 MCG/0.5ML IJ INJ: 0.5000 mL | INJECTION | INTRAMUSCULAR | 0 refills | Status: AC

## 2020-01-14 NOTE — Telephone Encounter (Signed)
Printed and placed in quick sign folder for signature.

## 2020-01-14 NOTE — Addendum Note (Signed)
Addended by: Adair Laundry on: 01/14/2020 09:30 AM   Modules accepted: Orders

## 2020-01-15 NOTE — Telephone Encounter (Signed)
Signed and placed in mail.

## 2020-01-23 ENCOUNTER — Telehealth: Payer: Self-pay

## 2020-01-23 NOTE — Telephone Encounter (Signed)
Pt has questions about immunizations. Please advise on home phone

## 2020-01-23 NOTE — Telephone Encounter (Signed)
Pt called because she received the rxs for the shingles, tdap and pneumovax-23 vaccine in the mail. Pt stated that she had the pneumovax-23 last year and wasn't sure why she had to get it again so she is wanting to know if she really needs to get that vaccine or not?

## 2020-01-23 NOTE — Telephone Encounter (Signed)
She does NOT NEED any more pneumonia vaccines !

## 2020-01-24 NOTE — Telephone Encounter (Signed)
LMTCB

## 2020-01-27 NOTE — Telephone Encounter (Signed)
Patient was returning call from Friday about the message she left wanted a call back.

## 2020-01-27 NOTE — Telephone Encounter (Signed)
Spoke with pt to let her know that she does not need the pneumonia vaccine. Pt gave a verbal understanding.

## 2020-01-29 DIAGNOSIS — H0100A Unspecified blepharitis right eye, upper and lower eyelids: Secondary | ICD-10-CM | POA: Diagnosis not present

## 2020-01-30 ENCOUNTER — Ambulatory Visit
Payer: Medicare Other | Attending: Student in an Organized Health Care Education/Training Program | Admitting: Student in an Organized Health Care Education/Training Program

## 2020-01-30 ENCOUNTER — Encounter: Payer: Self-pay | Admitting: Student in an Organized Health Care Education/Training Program

## 2020-01-30 ENCOUNTER — Other Ambulatory Visit: Payer: Self-pay

## 2020-01-30 VITALS — BP 163/96 | HR 69 | Temp 96.6°F | Resp 16 | Ht 68.0 in | Wt 120.0 lb

## 2020-01-30 DIAGNOSIS — R2689 Other abnormalities of gait and mobility: Secondary | ICD-10-CM

## 2020-01-30 DIAGNOSIS — Z9689 Presence of other specified functional implants: Secondary | ICD-10-CM

## 2020-01-30 DIAGNOSIS — M48062 Spinal stenosis, lumbar region with neurogenic claudication: Secondary | ICD-10-CM

## 2020-01-30 DIAGNOSIS — M47818 Spondylosis without myelopathy or radiculopathy, sacral and sacrococcygeal region: Secondary | ICD-10-CM | POA: Diagnosis not present

## 2020-01-30 DIAGNOSIS — M47816 Spondylosis without myelopathy or radiculopathy, lumbar region: Secondary | ICD-10-CM

## 2020-01-30 DIAGNOSIS — M5136 Other intervertebral disc degeneration, lumbar region: Secondary | ICD-10-CM

## 2020-01-30 DIAGNOSIS — M5416 Radiculopathy, lumbar region: Secondary | ICD-10-CM | POA: Diagnosis not present

## 2020-01-30 DIAGNOSIS — G894 Chronic pain syndrome: Secondary | ICD-10-CM | POA: Diagnosis not present

## 2020-01-30 MED ORDER — OXYCODONE-ACETAMINOPHEN 10-325 MG PO TABS: 1.0000 | ORAL_TABLET | ORAL | 0 refills | Status: AC | PRN

## 2020-01-30 MED ORDER — OXYCODONE-ACETAMINOPHEN 10-325 MG PO TABS: 1.0000 | ORAL_TABLET | ORAL | 0 refills | Status: DC | PRN

## 2020-01-30 NOTE — Patient Instructions (Signed)
Oxycodone/APAP to last until 05/13/20 has been escribed to your pharmacy.

## 2020-01-30 NOTE — Progress Notes (Signed)
PROVIDER NOTE: Information contained herein reflects review and annotations entered in association with encounter. Interpretation of such information and data should be left to medically-trained personnel. Information provided to patient can be located elsewhere in the medical record under "Patient Instructions". Document created using STT-dictation technology, any transcriptional errors that may result from process are unintentional.    Patient: [name]  Service Category: E/M  Provider: [name], MD  DOB: October 15, 1940  DOS: 01/30/2020  Specialty: Interventional Pain Management  MRN: [id number]  Setting: Ambulatory outpatient  PCP: [name], MD  Type: Established Patient    Referring Provider: [name], MD  Location: Office  Delivery: Face-to-face     HPI  [name], a 79 y.o. year old female, is here today because of her Chronic pain syndrome [G89.4]. Ms. Delgreco primary complain today is Back Pain (lower, concentrated in buttocks) Last encounter: My last encounter with her was on 01/06/2020. Pertinent problems: Ms. Ambroise has Spinal stenosis of thoracolumbar region; Spinal stenosis in cervical region; Spinal stenosis, lumbar region, with neurogenic claudication; Lumbar spondylosis; SI joint arthritis; Lumbar degenerative disc disease; Chronic pain syndrome; Lumbar radiculopathy; Lumbar facet arthropathy; and Back pain on their pertinent problem list. Pain Assessment: Severity of Chronic pain is reported as a 8 /10. Location: Back Right, Left, Lower/through buttocks to knees. Onset: More than a month ago. Quality: Aching, Constant. Timing: Constant. Modifying factor(s): meds. Vitals:  height is 5' 8"  (1.727 m) and weight is 120 lb (54.4 kg). Her temporal temperature is 96.6 F (35.9 C) (abnormal). Her blood pressure is 163/96 (abnormal) and her pulse is 69. Her respiration is 16 and oxygen saturation is 100%.   Reason for encounter: medication management.   No change in  medical history since last visit.  Patient's pain is at baseline.  Patient continues multimodal pain regimen as prescribed.  States that it provides pain relief and improvement in functional status. Continues with NEVRO spinal cord stimulation.  States that she is working with device representative to update her program settings to optimize stimulation parameters.  Pharmacotherapy Assessment   01/14/2020  11/13/2019   1  Oxycodone-Acetaminophen 10-325 150.00  30  Bi Lat  [id number]  Wal (7587)  0/0  75.00 MME  Comm      Analgesic: Percocet 10 mg up to 5 times a day as needed for severe breakthrough pain, quantity 150/month; MME equals 75   Monitoring: [place] PMP: PDMP not reviewed this encounter.       Pharmacotherapy: No side-effects or adverse reactions reported. Compliance: No problems identified. Effectiveness: Clinically acceptable.  [name], RN  01/30/2020  3:06 PM  Sign when Signing Visit Nursing Pain Medication Assessment:  Safety precautions to be maintained throughout the outpatient stay will include: orient to surroundings, keep bed in low position, maintain call bell within reach at all times, provide assistance with transfer out of bed and ambulation.  Medication Inspection Compliance: Ms. Kalp did not comply with our request to bring her pills to be counted. She was reminded that bringing the medication bottles, even when empty, is a requirement.  Medication: None brought in. Pill/Patch Count: None available to be counted. Bottle Appearance: No container available. Did not bring bottle(s) to appointment. Filled Date: N/A Last Medication intake:  Today   Pt. Instructed to bring oxycodone container to each appt.   UDS:  Summary  Date Value Ref Range Status  08/13/2019 Note  Final    Comment:    ==================================================================== [employer] [facility]  13 (MW) ==================================================================== Test                              Result       Flag       Units Drug Present and Declared for Prescription Verification   Oxycodone                      3800         EXPECTED   ng/mg creat   Oxymorphone                    10426        EXPECTED   ng/mg creat   Noroxycodone                   10739        EXPECTED   ng/mg creat   Noroxymorphone                 2334         EXPECTED   ng/mg creat    Sources of oxycodone are scheduled prescription medications.    Oxymorphone, noroxycodone, and noroxymorphone are expected    metabolites of oxycodone. Oxymorphone is also available as a    scheduled prescription medication. ==================================================================== Test                      Result    Flag   Units      Ref Range   Creatinine              38               mg/dL      >=20 ==================================================================== Declared Medications:  The flagging and interpretation on this report are based on the  following declared medications.  Unexpected results may arise from  inaccuracies in the declared medications.  **Note: The testing scope of this panel includes these medications:  Oxycodone (Percocet)  **Note: The testing scope of this panel does not include the  following reported medications:  Acetaminophen (Tylenol)  Acetaminophen (Percocet)  Amitriptyline (Elavil)  Amlodipine (Norvasc)  Gabapentin (Neurontin)  Hyoscyamine (Uribel)  Losartan (Cozaar)  Methenamine (Uribel)  Methylene Blue (Uribel)  Metoprolol (Toprol)  Pantoprazole (Protonix)  Phenazopyridine (Pyridium)  Phenyl salicylate (Uribel)  Polyethylene Glycol (MiraLAX)  Promethazine (Phenergan)  Rosuvastatin (Crestor)  Sodium phosphate, monobasic (Uribel)  Solifenacin (Vesicare)  Vitamin D2 (Drisdol) ==================================================================== For clinical consultation, please call [phone]. ====================================================================      ROS  Constitutional: Denies any fever or chills Gastrointestinal: No reported hemesis, hematochezia, vomiting, or acute GI distress Musculoskeletal: Low back, bilateral leg pain Neurological: No reported episodes of acute onset apraxia, aphasia, dysarthria, agnosia, amnesia, paralysis, loss of coordination, or loss of consciousness  Medication Review  Menthol (Topical Analgesic), acetaminophen, amLODipine, amitriptyline, gabapentin, losartan, metoprolol succinate, oxyCODONE-acetaminophen, pantoprazole, polyethylene glycol, promethazine, rosuvastatin, and sucralfate  History Review  Allergy: Ms. Genrich has No Known Allergies. Drug: Ms. Pasqual  reports no history of drug use. Alcohol:  reports no history of alcohol use. Tobacco:  reports that she has been smoking cigarettes. She has a 28.00 pack-year smoking history. She has never used smokeless tobacco. Social: Ms. Nofsinger  reports that she has been smoking cigarettes. She has a 28.00 pack-year smoking history. She has never used smokeless tobacco. She reports that she does not drink alcohol and does not use  drugs. Medical:  has a past medical history of AICD (automatic cardioverter/defibrillator) present ([place] cardiologist--- dr Caryl Comes ), Arthritis, Bladder cancer [facility]) (urologist-  dr Junious Silk), Chronic hyponatremia, Chronic low back pain with bilateral sciatica, Chronic systolic (congestive) heart failure ([place]), COPD with emphysema ([place]), Coronary artery disease (cardiologist-  dr Kathlyn [place]), DDD (degenerative disc disease), thoracolumbar, Frequent urination, Full dentures, Gait instability, GERD (gastroesophageal reflux disease), History of iron deficiency anemia (11/2013), History of stomach ulcers (11/2013), Hypertension, LBBB (left bundle branch block), NICM (nonischemic cardiomyopathy) ([place]) (last echo 12-31-2015 ef 55-60%), Nocturia more than twice per night,  S/P insertion of spinal cord stimulator (followed by [facility] Dr Pecola Leisure (notes in care everywhere)), and Scoliosis. Surgical: Ms. Paolo  has a past surgical history that includes Esophagogastroduodenoscopy (N/A, 11/28/2013); Transurethral resection of bladder tumor (N/A, 12/13/2013); Transurethral resection of bladder tumor (N/A, 01/17/2014); Cataract extraction w/ intraocular lens  implant, bilateral (Bilateral, right 12-2013 / left  02-2014); Cystoscopy with retrograde pyelogram, ureteroscopy and stent placement (Bilateral, 04/18/2014); Transurethral resection of bladder tumor (N/A, 04/18/2014); Lumbar disc surgery (1980's); bi-ventricular implantable cardioverter defibrillator (N/A, 05/07/2014); Cystoscopy w/ retrogrades (Bilateral, 01/06/2015); Cystoscopy with biopsy (N/A, 10/06/2015); Cardiac catheterization (03-03-2014  dr Kathlyn [place]   [facility]); Transurethral resection of bladder (1995); Cardiovascular stress test (11/25/2013); transthoracic echocardiogram (12/31/2015   dr Caryl Comes); Cystoscopy with fulgeration (N/A, 06/30/2017); Spinal cord stimulator implant (10-09-2018   @[facility] ); Cystoscopy with biopsy (N/A, 02/12/2019); and Transurethral resection of bladder tumor (N/A, 12/13/2019). Family: family history includes Cancer in her brother and mother.  Laboratory Chemistry Profile   Renal Lab Results  Component Value Date   BUN 10 12/13/2019   CREATININE 0.70 12/13/2019   BCR 18 04/28/2014   GFR 89.19 08/27/2019   GFRAA >60 06/17/2019   GFRNONAA >60 06/17/2019     Hepatic Lab Results  Component Value Date   AST 38 (H) 08/27/2019   ALT 32 08/27/2019   ALBUMIN 4.5 08/27/2019   ALKPHOS 83 08/27/2019   LIPASE 30 11/23/2013     Electrolytes Lab Results  Component Value Date   NA 137 12/13/2019   K 3.6 12/13/2019   CL 96 (L) 12/13/2019   CALCIUM 9.8 08/27/2019   MG 2.3 06/03/2019     Bone Lab Results  Component Value Date   VD25OH 36.17 01/11/2019     Inflammation (CRP:  Acute Phase) (ESR: Chronic Phase) No results found for: CRP, ESRSEDRATE, LATICACIDVEN     Note: Above Lab results reviewed.  Recent Imaging Review  CUP PACEART REMOTE DEVICE CHECK Scheduled remote reviewed. Normal device function.    Next remote 91 days.  Kathy Breach, RN, CCDS, [employer] Note: Reviewed        Physical Exam  General appearance: Well nourished, well developed, and well hydrated. In no apparent acute distress Mental status: Alert, oriented x 3 (person, place, & time)       Respiratory: No evidence of acute respiratory distress Eyes: PERLA Vitals: BP (!) 163/96   Pulse 69   Temp (!) 96.6 F (35.9 C) (Temporal)   Resp 16   Ht 5' 8"  (1.727 m)   Wt 120 lb (54.4 kg)   SpO2 100%   BMI 18.25 kg/m  BMI: Estimated body mass index is 18.25 kg/m as calculated from the following:   Height as of this encounter: 5' 8"  (1.727 m).   Weight as of this encounter: 120 lb (54.4 kg). Ideal: Ideal body weight: 63.9 kg (140 lb 14 oz)   Lumbar  Exam  Skin & Axial Inspection:Well healed scar from previous spine surgery detected Alignment:Symmetrical Functional GBT:DVVO restricted ROM Stability:No instability detected Muscle Tone/Strength:Functionally intact. No obvious neuro-muscular anomalies detected. Sensory (Neurological):Dermatomal pain pattern Palpation:No palpable anomalies   Gait & Posture Assessment  Ambulation:Patient ambulates using a walker Gait:Limited. Using assistive device to ambulate Posture:Difficulty standing up straight, due to pain  Lower Extremity Exam    Side:Right lower extremity  Side:Left lower extremity  Stability:No instability observed  Stability:No instability observed  Skin & Extremity Inspection:Skin color, temperature, and hair growth are WNL. No peripheral edema or cyanosis. No masses, redness, swelling, asymmetry, or associated skin lesions. No contractures.  Skin & Extremity  Inspection:Skin color, temperature, and hair growth are WNL. No peripheral edema or cyanosis. No masses, redness, swelling, asymmetry, or associated skin lesions. No contractures.  Functional HYW:VPXT restricted ROMfor all joints of the lower extremity   Functional GGY:IRSW restricted ROMfor all joints of the lower extremity   Muscle Tone/Strength:Functionally intact. No obvious neuro-muscular anomalies detected.  Muscle Tone/Strength:Functionally intact. No obvious neuro-muscular anomalies detected.  Sensory (Neurological):Dermatomal pain pattern  Sensory (Neurological):Dermatomal pain pattern  DTR: Patellar:deferred today Achilles:deferred today Plantar:deferred today  DTR: Patellar:deferred today Achilles:deferred today Plantar:deferred today  Palpation:No palpable anomalies  Palpation:No palpable anomalies     Assessment   Status Diagnosis  Controlled Controlled Controlled 1. Chronic pain syndrome   2. Lumbar radiculopathy   3. Lumbar degenerative disc disease   4. Spinal stenosis, lumbar region, with neurogenic claudication   5. Spinal cord stimulator status   6. Balance problem   7. Lumbar facet arthropathy   8. SI joint arthritis   9. Lumbar spondylosis       Plan of Care  Ms. TANGIE STAY has a current medication list which includes the following long-term medication(s): amitriptyline, amlodipine, gabapentin, losartan, metoprolol succinate, pantoprazole, promethazine, rosuvastatin, and sucralfate.  Pharmacotherapy (Medications Ordered): Meds ordered this encounter  Medications  . oxyCODONE-acetaminophen (PERCOCET) 10-325 MG tablet    Sig: Take 1 tablet by mouth every 4 (four) hours as needed for pain. Must last 30 days.    Dispense:  150 tablet    Refill:  0    Chronic Pain. (STOP Act - Not applicable). Fill one day early if closed on scheduled refill date.  Max 5 tablets a daily, 150/month.  .  oxyCODONE-acetaminophen (PERCOCET) 10-325 MG tablet    Sig: Take 1 tablet by mouth every 4 (four) hours as needed for pain. Must last 30 days.    Dispense:  150 tablet    Refill:  0    Chronic Pain. (STOP Act - Not applicable). Fill one day early if closed on scheduled refill date.  Max 5 tablets a daily, 150/month.  . oxyCODONE-acetaminophen (PERCOCET) 10-325 MG tablet    Sig: Take 1 tablet by mouth every 4 (four) hours as needed for pain. Must last 30 days.    Dispense:  150 tablet    Refill:  0    Chronic Pain. (STOP Act - Not applicable). Fill one day early if closed on scheduled refill date.  Max 5 tablets a daily, 150/month.    Follow-up plan:   Return in about 3 months (around 05/01/2020) for Medication Management, in person.   Recent Visits Date Type Provider Dept  11/13/19 Office Visit [name], MD [facility]  Showing recent visits within past 90 days and meeting all other requirements Today's Visits Date Type Provider Dept  01/30/20 Office Visit [name], MD [facility]  Showing today's visits and meeting all other requirements Future Appointments Date Type Provider Dept  04/27/20 Appointment [name], MD [facility]  Showing future appointments within next 90 days and meeting all other requirements  I discussed the assessment and treatment plan with the patient. The patient was provided an opportunity to ask questions and all were answered. The patient agreed with the plan and demonstrated an understanding of the instructions.  Patient advised to call back or seek an in-person evaluation if the symptoms or condition worsens.  Duration of encounter:30 minutes.  Note by: [name], MD Date: 01/30/2020; Time: 3:38 PM

## 2020-01-30 NOTE — Progress Notes (Signed)
Nursing Pain Medication Assessment:  Safety precautions to be maintained throughout the outpatient stay will include: orient to surroundings, keep bed in low position, maintain call bell within reach at all times, provide assistance with transfer out of bed and ambulation.  Medication Inspection Compliance: Ms. Valadez did not comply with our request to bring her pills to be counted. She was reminded that bringing the medication bottles, even when empty, is a requirement.  Medication: None brought in. Pill/Patch Count: None available to be counted. Bottle Appearance: No container available. Did not bring bottle(s) to appointment. Filled Date: N/A Last Medication intake:  Today   Pt. Instructed to bring oxycodone container to each appt.

## 2020-01-31 ENCOUNTER — Other Ambulatory Visit: Payer: Self-pay | Admitting: Internal Medicine

## 2020-02-11 ENCOUNTER — Encounter: Payer: Medicare Other | Admitting: Student in an Organized Health Care Education/Training Program

## 2020-02-26 ENCOUNTER — Ambulatory Visit (INDEPENDENT_AMBULATORY_CARE_PROVIDER_SITE_OTHER): Payer: Medicare Other

## 2020-02-26 DIAGNOSIS — I428 Other cardiomyopathies: Secondary | ICD-10-CM | POA: Diagnosis not present

## 2020-02-26 DIAGNOSIS — I5022 Chronic systolic (congestive) heart failure: Secondary | ICD-10-CM

## 2020-02-26 LAB — CUP PACEART REMOTE DEVICE CHECK
Battery Remaining Longevity: 15 mo
Battery Voltage: 2.86 V
Brady Statistic AP VP Percent: 48.98 %
Brady Statistic AP VS Percent: 0.96 %
Brady Statistic AS VP Percent: 48.76 %
Brady Statistic AS VS Percent: 1.3 %
Brady Statistic RA Percent Paced: 48.04 %
Brady Statistic RV Percent Paced: 93.67 %
Date Time Interrogation Session: 20211208044223
HighPow Impedance: 68 Ohm
Implantable Lead Implant Date: 20160217
Implantable Lead Implant Date: 20160217
Implantable Lead Implant Date: 20160217
Implantable Lead Location: 753858
Implantable Lead Location: 753859
Implantable Lead Location: 753860
Implantable Lead Model: 4298
Implantable Lead Model: 5076
Implantable Pulse Generator Implant Date: 20160217
Lead Channel Impedance Value: 361 Ohm
Lead Channel Impedance Value: 361 Ohm
Lead Channel Impedance Value: 399 Ohm
Lead Channel Impedance Value: 456 Ohm
Lead Channel Impedance Value: 456 Ohm
Lead Channel Impedance Value: 475 Ohm
Lead Channel Impedance Value: 551 Ohm
Lead Channel Impedance Value: 589 Ohm
Lead Channel Impedance Value: 703 Ohm
Lead Channel Impedance Value: 703 Ohm
Lead Channel Impedance Value: 817 Ohm
Lead Channel Impedance Value: 874 Ohm
Lead Channel Impedance Value: 874 Ohm
Lead Channel Pacing Threshold Amplitude: 0.5 V
Lead Channel Pacing Threshold Amplitude: 0.875 V
Lead Channel Pacing Threshold Amplitude: 1.125 V
Lead Channel Pacing Threshold Pulse Width: 0.4 ms
Lead Channel Pacing Threshold Pulse Width: 0.4 ms
Lead Channel Pacing Threshold Pulse Width: 0.4 ms
Lead Channel Sensing Intrinsic Amplitude: 2.375 mV
Lead Channel Sensing Intrinsic Amplitude: 2.375 mV
Lead Channel Sensing Intrinsic Amplitude: 23 mV
Lead Channel Sensing Intrinsic Amplitude: 23 mV
Lead Channel Setting Pacing Amplitude: 2 V
Lead Channel Setting Pacing Amplitude: 2.25 V
Lead Channel Setting Pacing Amplitude: 2.5 V
Lead Channel Setting Pacing Pulse Width: 0.4 ms
Lead Channel Setting Pacing Pulse Width: 0.4 ms
Lead Channel Setting Sensing Sensitivity: 0.45 mV

## 2020-03-10 NOTE — Progress Notes (Signed)
Remote ICD transmission.   

## 2020-03-11 ENCOUNTER — Other Ambulatory Visit: Payer: Self-pay | Admitting: Internal Medicine

## 2020-03-17 DIAGNOSIS — Z8551 Personal history of malignant neoplasm of bladder: Secondary | ICD-10-CM | POA: Diagnosis not present

## 2020-04-02 ENCOUNTER — Other Ambulatory Visit: Payer: Self-pay | Admitting: Internal Medicine

## 2020-04-03 DIAGNOSIS — N3000 Acute cystitis without hematuria: Secondary | ICD-10-CM | POA: Diagnosis not present

## 2020-04-03 DIAGNOSIS — R8271 Bacteriuria: Secondary | ICD-10-CM | POA: Diagnosis not present

## 2020-04-03 DIAGNOSIS — R300 Dysuria: Secondary | ICD-10-CM | POA: Diagnosis not present

## 2020-04-10 ENCOUNTER — Other Ambulatory Visit: Payer: Self-pay | Admitting: Internal Medicine

## 2020-04-10 DIAGNOSIS — I420 Dilated cardiomyopathy: Secondary | ICD-10-CM

## 2020-04-10 NOTE — Telephone Encounter (Signed)
This is a [place] pt 

## 2020-04-27 ENCOUNTER — Encounter: Payer: Medicare Other | Admitting: Student in an Organized Health Care Education/Training Program

## 2020-04-28 ENCOUNTER — Telehealth: Payer: Self-pay

## 2020-04-29 NOTE — Telephone Encounter (Signed)
Error

## 2020-04-30 ENCOUNTER — Encounter: Payer: Medicare Other | Admitting: Student in an Organized Health Care Education/Training Program

## 2020-05-07 ENCOUNTER — Other Ambulatory Visit: Payer: Self-pay

## 2020-05-07 ENCOUNTER — Ambulatory Visit
Payer: Medicare Other | Attending: Student in an Organized Health Care Education/Training Program | Admitting: Student in an Organized Health Care Education/Training Program

## 2020-05-07 ENCOUNTER — Encounter: Payer: Self-pay | Admitting: Student in an Organized Health Care Education/Training Program

## 2020-05-07 VITALS — BP 140/93 | HR 69 | Temp 97.3°F | Ht 68.0 in | Wt 120.0 lb

## 2020-05-07 DIAGNOSIS — M5136 Other intervertebral disc degeneration, lumbar region: Secondary | ICD-10-CM | POA: Diagnosis present

## 2020-05-07 DIAGNOSIS — M47816 Spondylosis without myelopathy or radiculopathy, lumbar region: Secondary | ICD-10-CM | POA: Diagnosis not present

## 2020-05-07 DIAGNOSIS — G894 Chronic pain syndrome: Secondary | ICD-10-CM | POA: Insufficient documentation

## 2020-05-07 DIAGNOSIS — M48062 Spinal stenosis, lumbar region with neurogenic claudication: Secondary | ICD-10-CM | POA: Insufficient documentation

## 2020-05-07 DIAGNOSIS — Z9689 Presence of other specified functional implants: Secondary | ICD-10-CM | POA: Diagnosis not present

## 2020-05-07 DIAGNOSIS — M47818 Spondylosis without myelopathy or radiculopathy, sacral and sacrococcygeal region: Secondary | ICD-10-CM | POA: Insufficient documentation

## 2020-05-07 DIAGNOSIS — M5416 Radiculopathy, lumbar region: Secondary | ICD-10-CM | POA: Diagnosis not present

## 2020-05-07 MED ORDER — OXYCODONE-ACETAMINOPHEN 10-325 MG PO TABS: 1.0000 | ORAL_TABLET | ORAL | 0 refills | Status: AC | PRN

## 2020-05-07 MED ORDER — GABAPENTIN 300 MG PO CAPS
300.0000 mg | ORAL_CAPSULE | Freq: Three times a day (TID) | ORAL | 2 refills | Status: DC
Start: 2020-05-07 — End: 2020-07-20

## 2020-05-07 NOTE — Progress Notes (Signed)
Nursing Pain Medication Assessment:  Safety precautions to be maintained throughout the outpatient stay will include: orient to surroundings, keep bed in low position, maintain call bell within reach at all times, provide assistance with transfer out of bed and ambulation.  Medication Inspection Compliance: Pill count conducted under aseptic conditions, in front of the patient. Neither the pills nor the bottle was removed from the patient's sight at any time. Once count was completed pills were immediately returned to the patient in their original bottle.  Medication: Oxycodone/APAP Pill/Patch Count: 33 of 150 pills remain Pill/Patch Appearance: Markings consistent with prescribed medication Bottle Appearance: Standard pharmacy container. Clearly labeled. Filled Date: 1 / 40 / 22 Last Medication intake:  TodaySafety precautions to be maintained throughout the outpatient stay will include: orient to surroundings, keep bed in low position, maintain call bell within reach at all times, provide assistance with transfer out of bed and ambulation.

## 2020-05-07 NOTE — Progress Notes (Signed)
PROVIDER NOTE: Information contained herein reflects review and annotations entered in association with encounter. Interpretation of such information and data should be left to medically-trained personnel. Information provided to patient can be located elsewhere in the medical record under "Patient Instructions". Document created using STT-dictation technology, any transcriptional errors that may result from process are unintentional.    Patient: [name]  Service Category: E/M  Provider: [name], MD  DOB: 1940/07/08  DOS: 05/07/2020  Specialty: Interventional Pain Management  MRN: [id number]  Setting: Ambulatory outpatient  PCP: [name], MD  Type: Established Patient    Referring Provider: [name], MD  Location: Office  Delivery: Face-to-face     HPI  [name], a 80 y.o. year old female, is here today because of her Chronic pain syndrome [G89.4]. [name] primary complain today is Back Pain Last encounter: My last encounter with her was on 04/27/2020. Pertinent problems: [name] has Spinal stenosis of thoracolumbar region; Spinal stenosis in cervical region; Spinal stenosis, lumbar region, with neurogenic claudication; Lumbar spondylosis; SI joint arthritis; Lumbar degenerative disc disease; Chronic pain syndrome; Lumbar radiculopathy; Lumbar facet arthropathy; and Back pain on their pertinent problem list. Pain Assessment: Severity of Chronic pain is reported as a 8 /10. Location: Back Lower/pain radiaties down hip and buttock and thigh. Onset: More than a month ago. Quality: Constant,Aching. Timing: Constant. Modifying factor(s): meds and laying down. Vitals:  height is 5' 8" (1.727 m) and weight is 120 lb (54.4 kg). Her temperature is 97.3 F (36.3 C) (abnormal). Her blood pressure is 140/93 (abnormal) and her pulse is 69. Her oxygen saturation is 94%.   Reason for encounter: medication management.    No change in medical history since last visit.  Patient's pain  is at baseline.  Patient continues multimodal pain regimen as prescribed.  States that it provides pain relief and improvement in functional status. [name] would like for me to manage her Gabapentin Continues with spinal cord stimulation. States that she hasn't really noticed a huge difference with it. Implanted at [facility] with Dr Mearl Latin  Pharmacotherapy Assessment   Analgesic: Percocet 10 mg up to 5 times a day as needed for severe breakthrough pain, quantity 150/month; MME equals 75   Monitoring: [place] PMP: PDMP not reviewed this encounter.       Pharmacotherapy: No side-effects or adverse reactions reported. Compliance: No problems identified. Effectiveness: Clinically acceptable.  Chauncey Fischer, RN  05/07/2020 11:05 AM  Sign when Signing Visit Nursing Pain Medication Assessment:  Safety precautions to be maintained throughout the outpatient stay will include: orient to surroundings, keep bed in low position, maintain call bell within reach at all times, provide assistance with transfer out of bed and ambulation.  Medication Inspection Compliance: Pill count conducted under aseptic conditions, in front of the patient. Neither the pills nor the bottle was removed from the patient's sight at any time. Once count was completed pills were immediately returned to the patient in their original bottle.  Medication: Oxycodone/APAP Pill/Patch Count: 33 of 150 pills remain Pill/Patch Appearance: Markings consistent with prescribed medication Bottle Appearance: Standard pharmacy container. Clearly labeled. Filled Date: 1 / 73 / 22 Last Medication intake:  TodaySafety precautions to be maintained throughout the outpatient stay will include: orient to surroundings, keep bed in low position, maintain call bell within reach at all times, provide assistance with transfer out of bed and ambulation.     UDS:  Summary  Date Value Ref Range Status  08/13/2019 Note  Final    Comment:     ==================================================================== [employer] [facility] 13 (MW) ==================================================================== Test                             Result       Flag       Units Drug Present and Declared for Prescription Verification   Oxycodone                      3800         EXPECTED   ng/mg creat   Oxymorphone                    10426        EXPECTED   ng/mg creat   Noroxycodone                   10739        EXPECTED   ng/mg creat   Noroxymorphone                 2334         EXPECTED   ng/mg creat    Sources of oxycodone are scheduled prescription medications.    Oxymorphone, noroxycodone, and noroxymorphone are expected    metabolites of oxycodone. Oxymorphone is also available as a    scheduled prescription medication. ==================================================================== Test                      Result    Flag   Units      Ref Range   Creatinine              38               mg/dL      >=20 ==================================================================== Declared Medications:  The flagging and interpretation on this report are based on the  following declared medications.  Unexpected results may arise from  inaccuracies in the declared medications.  **Note: The testing scope of this panel includes these medications:  Oxycodone (Percocet)  **Note: The testing scope of this panel does not include the  following reported medications:  Acetaminophen (Tylenol)  Acetaminophen (Percocet)  Amitriptyline (Elavil)  Amlodipine (Norvasc)  Gabapentin (Neurontin)  Hyoscyamine (Uribel)  Losartan (Cozaar)  Methenamine (Uribel)  Methylene Blue (Uribel)  Metoprolol (Toprol)  Pantoprazole (Protonix)  Phenazopyridine (Pyridium)  Phenyl salicylate (Uribel)  Polyethylene Glycol (MiraLAX)  Promethazine (Phenergan)  Rosuvastatin (Crestor)  Sodium phosphate, monobasic (Uribel)  Solifenacin (Vesicare)  Vitamin D2  (Drisdol) ==================================================================== For clinical consultation, please call [phone]. ====================================================================      ROS  Constitutional: Denies any fever or chills Gastrointestinal: No reported hemesis, hematochezia, vomiting, or acute GI distress Musculoskeletal: +LBP, b/l leg pain Neurological: No reported episodes of acute onset apraxia, aphasia, dysarthria, agnosia, amnesia, paralysis, loss of coordination, or loss of consciousness  Medication Review  Menthol (Topical Analgesic), acetaminophen, amLODipine, amitriptyline, gabapentin, losartan, metoprolol succinate, oxyCODONE-acetaminophen, polyethylene glycol, promethazine, rosuvastatin, and sucralfate  History Review  Allergy: [name] has No Known Allergies. Drug: [name]  reports no history of drug use. Alcohol:  reports no history of alcohol use. Tobacco:  reports that she has been smoking cigarettes. She has a 28.00 pack-year smoking history. She has never used smokeless tobacco. Social: [name]  reports that she has been smoking cigarettes. She has a 28.00 pack-year smoking history. She has never used smokeless tobacco.  She reports that she does not drink alcohol and does not use drugs. Medical:  has a past medical history of AICD (automatic cardioverter/defibrillator) present ([place] cardiologist--- dr Caryl Comes ), Arthritis, Bladder cancer [facility]) (urologist-  dr Junious Silk), Chronic hyponatremia, Chronic low back pain with bilateral sciatica, Chronic systolic (congestive) heart failure ([place]), COPD with emphysema ([place]), Coronary artery disease (cardiologist-  dr Kathlyn [place]), DDD (degenerative disc disease), thoracolumbar, Frequent urination, Full dentures, Gait instability, GERD (gastroesophageal reflux disease), History of iron deficiency anemia (11/2013), History of stomach ulcers (11/2013), Hypertension, LBBB (left bundle branch block), NICM  (nonischemic cardiomyopathy) ([place]) (last echo 12-31-2015 ef 55-60%), Nocturia more than twice per night, S/P insertion of spinal cord stimulator (followed by [facility]---- Dr Pecola Leisure (notes in care everywhere)), and Scoliosis. Surgical: [name]  has a past surgical history that includes Esophagogastroduodenoscopy (N/A, 11/28/2013); Transurethral resection of bladder tumor (N/A, 12/13/2013); Transurethral resection of bladder tumor (N/A, 01/17/2014); Cataract extraction w/ intraocular lens  implant, bilateral (Bilateral, right 12-2013 / left  02-2014); Cystoscopy with retrograde pyelogram, ureteroscopy and stent placement (Bilateral, 04/18/2014); Transurethral resection of bladder tumor (N/A, 04/18/2014); Lumbar disc surgery (1980's); bi-ventricular implantable cardioverter defibrillator (N/A, 05/07/2014); Cystoscopy w/ retrogrades (Bilateral, 01/06/2015); Cystoscopy with biopsy (N/A, 10/06/2015); Cardiac catheterization (03-03-2014  dr Kathlyn [place]   [facility]); Transurethral resection of bladder (1995); Cardiovascular stress test (11/25/2013); transthoracic echocardiogram (12/31/2015   dr Caryl Comes); Cystoscopy with fulgeration (N/A, 06/30/2017); Spinal cord stimulator implant (10-09-2018   _0 ); Cystoscopy with biopsy (N/A, 02/12/2019); and Transurethral resection of bladder tumor (N/A, 12/13/2019). Family: family history includes Cancer in her brother and mother.  Laboratory Chemistry Profile   Renal Lab Results  Component Value Date   BUN 10 12/13/2019   CREATININE 0.70 12/13/2019   BCR 18 04/28/2014   GFR 89.19 08/27/2019   GFRAA >60 06/17/2019   GFRNONAA >60 06/17/2019     Hepatic Lab Results  Component Value Date   AST 38 (H) 08/27/2019   ALT 32 08/27/2019   ALBUMIN 4.5 08/27/2019   ALKPHOS 83 08/27/2019   LIPASE 30 11/23/2013     Electrolytes Lab Results  Component Value Date   NA 137 12/13/2019   K 3.6 12/13/2019   CL 96 (L) 12/13/2019   CALCIUM 9.8 08/27/2019   MG 2.3 06/03/2019      Bone Lab Results  Component Value Date   VD25OH 36.17 01/11/2019     Inflammation (CRP: Acute Phase) (ESR: Chronic Phase) No results found for: CRP, ESRSEDRATE, LATICACIDVEN     Note: Above Lab results reviewed.  Recent Imaging Review  CUP PACEART REMOTE DEVICE CHECK Scheduled remote reviewed. Normal device function.    Next remote 91 days.  Kathy Breach, RN, CCDS, [employer] Note: Reviewed        Physical Exam  General appearance: Well nourished, well developed, and well hydrated. In no apparent acute distress Mental status: Alert, oriented x 3 (person, place, & time)       Respiratory: No evidence of acute respiratory distress Eyes: PERLA Vitals: BP (!) 140/93   Pulse 69   Temp (!) 97.3 F (36.3 C)   Ht 5' 8" (1.727 m)   Wt 120 lb (54.4 kg)   SpO2 94%   BMI 18.25 kg/m  BMI: Estimated body mass index is 18.25 kg/m as calculated from the following:   Height as of this encounter: 5' 8" (1.727 m).   Weight as of this encounter: 120 lb (54.4 kg). Ideal: Ideal body weight: 63.9 kg (  140 lb 14 oz)   Lumbar Exam  Skin & Axial Inspection:Well healed scar from previous spine surgery detected, IPG for SCS in place Alignment:Symmetrical Functional DXA:JOIN restricted ROM Stability:No instability detected Muscle Tone/Strength:Functionally intact. No obvious neuro-muscular anomalies detected. Sensory (Neurological):Dermatomal pain pattern Palpation:No palpable anomalies   Gait & Posture Assessment  Ambulation:Patient ambulates using a walker Gait:Limited. Using assistive device to ambulate Posture:Difficulty standing up straight, due to pain  Lower Extremity Exam    Side:Right lower extremity  Side:Left lower extremity  Stability:No instability observed  Stability:No instability observed  Skin & Extremity Inspection:Skin color, temperature, and hair growth are WNL. No peripheral edema or cyanosis. No  masses, redness, swelling, asymmetry, or associated skin lesions. No contractures.  Skin & Extremity Inspection:Skin color, temperature, and hair growth are WNL. No peripheral edema or cyanosis. No masses, redness, swelling, asymmetry, or associated skin lesions. No contractures.  Functional OMV:EHMC restricted ROMfor all joints of the lower extremity   Functional NOB:SJGG restricted ROMfor all joints of the lower extremity   Muscle Tone/Strength:Functionally intact. No obvious neuro-muscular anomalies detected.  Muscle Tone/Strength:Functionally intact. No obvious neuro-muscular anomalies detected.  Sensory (Neurological):Dermatomal pain pattern  Sensory (Neurological):Dermatomal pain pattern  DTR: Patellar:deferred today Achilles:deferred today Plantar:deferred today  DTR: Patellar:deferred today Achilles:deferred today Plantar:deferred today  Palpation:No palpable anomalies  Palpation:No palpable anomalies      Assessment   Status Diagnosis  Persistent Persistent Persistent 1. Chronic pain syndrome   2. Lumbar radiculopathy   3. Lumbar degenerative disc disease   4. Spinal stenosis, lumbar region, with neurogenic claudication   5. Spinal cord stimulator status   6. Lumbar facet arthropathy   7. SI joint arthritis   8. Lumbar spondylosis       Plan of Care   [name] has a current medication list which includes the following long-term medication(s): amitriptyline, amlodipine, gabapentin, losartan, metoprolol succinate, promethazine, rosuvastatin, and sucralfate.  Pharmacotherapy (Medications Ordered): Meds ordered this encounter  Medications  . gabapentin (NEURONTIN) 300 MG capsule    Sig: Take 1 capsule (300 mg total) by mouth 3 (three) times daily. TAKE ONE CAPSULE BY MOUTH THREE TIMES DAILY AS NEEDED FOR BACK PAIN    Dispense:  180 capsule    Refill:  2  . oxyCODONE-acetaminophen (PERCOCET) 10-325 MG tablet     Sig: Take 1 tablet by mouth every 4 (four) hours as needed for pain. Must last 30 days.    Dispense:  150 tablet    Refill:  0    Chronic Pain. (STOP Act - Not applicable). Fill one day early if closed on scheduled refill date.  Max 5 tablets a daily, 150/month.  . oxyCODONE-acetaminophen (PERCOCET) 10-325 MG tablet    Sig: Take 1 tablet by mouth every 4 (four) hours as needed for pain. Must last 30 days.    Dispense:  150 tablet    Refill:  0    Chronic Pain. (STOP Act - Not applicable). Fill one day early if closed on scheduled refill date.  Max 5 tablets a daily, 150/month.  . oxyCODONE-acetaminophen (PERCOCET) 10-325 MG tablet    Sig: Take 1 tablet by mouth every 4 (four) hours as needed for pain. Must last 30 days.    Dispense:  150 tablet    Refill:  0    Chronic Pain. (STOP Act - Not applicable). Fill one day early if closed on scheduled refill date.  Max 5 tablets a daily, 150/month.   Follow-up plan:   Return in  about 13 weeks (around 08/06/2020) for Medication Management, in person.   Recent Visits No visits were found meeting these conditions. Showing recent visits within past 90 days and meeting all other requirements Today's Visits Date Type Provider Dept  05/07/20 Office Visit [name], MD [facility]  Showing today's visits and meeting all other requirements Future Appointments No visits were found meeting these conditions. Showing future appointments within next 90 days and meeting all other requirements  I discussed the assessment and treatment plan with the patient. The patient was provided an opportunity to ask questions and all were answered. The patient agreed with the plan and demonstrated an understanding of the instructions.  Patient advised to call back or seek an in-person evaluation if the symptoms or condition worsens.  Duration of encounter: 30 minutes.  Note by: [name], MD Date: 05/07/2020; Time: 11:33 AM

## 2020-05-27 ENCOUNTER — Ambulatory Visit (INDEPENDENT_AMBULATORY_CARE_PROVIDER_SITE_OTHER): Payer: Medicare Other

## 2020-05-27 DIAGNOSIS — I428 Other cardiomyopathies: Secondary | ICD-10-CM

## 2020-05-29 LAB — CUP PACEART REMOTE DEVICE CHECK
Battery Remaining Longevity: 13 mo
Battery Voltage: 2.84 V
Brady Statistic AP VP Percent: 42.19 %
Brady Statistic AP VS Percent: 0.63 %
Brady Statistic AS VP Percent: 56.11 %
Brady Statistic AS VS Percent: 1.07 %
Brady Statistic RA Percent Paced: 42.5 %
Brady Statistic RV Percent Paced: 96.96 %
Date Time Interrogation Session: 20220309073624
HighPow Impedance: 67 Ohm
Implantable Lead Implant Date: 20160217
Implantable Lead Implant Date: 20160217
Implantable Lead Implant Date: 20160217
Implantable Lead Location: 753858
Implantable Lead Location: 753859
Implantable Lead Location: 753860
Implantable Lead Model: 4298
Implantable Lead Model: 5076
Implantable Pulse Generator Implant Date: 20160217
Lead Channel Impedance Value: 342 Ohm
Lead Channel Impedance Value: 342 Ohm
Lead Channel Impedance Value: 361 Ohm
Lead Channel Impedance Value: 418 Ohm
Lead Channel Impedance Value: 418 Ohm
Lead Channel Impedance Value: 418 Ohm
Lead Channel Impedance Value: 513 Ohm
Lead Channel Impedance Value: 532 Ohm
Lead Channel Impedance Value: 608 Ohm
Lead Channel Impedance Value: 608 Ohm
Lead Channel Impedance Value: 817 Ohm
Lead Channel Impedance Value: 817 Ohm
Lead Channel Impedance Value: 836 Ohm
Lead Channel Pacing Threshold Amplitude: 0.5 V
Lead Channel Pacing Threshold Amplitude: 0.875 V
Lead Channel Pacing Threshold Amplitude: 1.375 V
Lead Channel Pacing Threshold Pulse Width: 0.4 ms
Lead Channel Pacing Threshold Pulse Width: 0.4 ms
Lead Channel Pacing Threshold Pulse Width: 0.4 ms
Lead Channel Sensing Intrinsic Amplitude: 2.25 mV
Lead Channel Sensing Intrinsic Amplitude: 2.25 mV
Lead Channel Sensing Intrinsic Amplitude: 20.375 mV
Lead Channel Sensing Intrinsic Amplitude: 20.375 mV
Lead Channel Setting Pacing Amplitude: 2 V
Lead Channel Setting Pacing Amplitude: 2.5 V
Lead Channel Setting Pacing Amplitude: 2.5 V
Lead Channel Setting Pacing Pulse Width: 0.4 ms
Lead Channel Setting Pacing Pulse Width: 0.4 ms
Lead Channel Setting Sensing Sensitivity: 0.45 mV

## 2020-06-04 NOTE — Progress Notes (Signed)
Remote ICD transmission.   

## 2020-06-05 ENCOUNTER — Other Ambulatory Visit: Payer: Self-pay | Admitting: Internal Medicine

## 2020-06-15 DIAGNOSIS — C678 Malignant neoplasm of overlapping sites of bladder: Secondary | ICD-10-CM | POA: Diagnosis not present

## 2020-06-15 DIAGNOSIS — N3000 Acute cystitis without hematuria: Secondary | ICD-10-CM | POA: Diagnosis not present

## 2020-07-10 ENCOUNTER — Other Ambulatory Visit: Payer: Self-pay | Admitting: Internal Medicine

## 2020-07-10 DIAGNOSIS — I420 Dilated cardiomyopathy: Secondary | ICD-10-CM

## 2020-07-10 NOTE — Telephone Encounter (Signed)
This is a [place] pt 

## 2020-07-15 ENCOUNTER — Ambulatory Visit: Payer: Medicare Other

## 2020-07-20 ENCOUNTER — Other Ambulatory Visit: Payer: Self-pay | Admitting: Internal Medicine

## 2020-07-24 ENCOUNTER — Telehealth: Payer: Self-pay | Admitting: Internal Medicine

## 2020-07-24 NOTE — Telephone Encounter (Signed)
Left message for patient to call back and schedule Medicare Annual Wellness Visit (AWV) in office.   If not able to come in office, please offer to do virtually or by telephone.   Last AWV:07/15/19  Please schedule at anytime with Nurse Health Advisor.

## 2020-08-03 DIAGNOSIS — M5134 Other intervertebral disc degeneration, thoracic region: Secondary | ICD-10-CM | POA: Diagnosis not present

## 2020-08-03 DIAGNOSIS — M40204 Unspecified kyphosis, thoracic region: Secondary | ICD-10-CM | POA: Diagnosis not present

## 2020-08-03 DIAGNOSIS — M5136 Other intervertebral disc degeneration, lumbar region: Secondary | ICD-10-CM | POA: Diagnosis not present

## 2020-08-03 DIAGNOSIS — M5442 Lumbago with sciatica, left side: Secondary | ICD-10-CM | POA: Diagnosis not present

## 2020-08-03 DIAGNOSIS — M5441 Lumbago with sciatica, right side: Secondary | ICD-10-CM | POA: Diagnosis not present

## 2020-08-03 DIAGNOSIS — G8929 Other chronic pain: Secondary | ICD-10-CM | POA: Diagnosis not present

## 2020-08-04 ENCOUNTER — Encounter: Payer: Medicare Other | Admitting: Student in an Organized Health Care Education/Training Program

## 2020-08-06 ENCOUNTER — Ambulatory Visit (INDEPENDENT_AMBULATORY_CARE_PROVIDER_SITE_OTHER): Payer: Medicare Other

## 2020-08-06 VITALS — Ht 68.0 in | Wt 120.0 lb

## 2020-08-06 DIAGNOSIS — Z0000 Encounter for general adult medical examination without abnormal findings: Secondary | ICD-10-CM

## 2020-08-06 NOTE — Progress Notes (Signed)
Subjective:   [name] is a 80 y.o. female who presents for Medicare Annual (Subsequent) preventive examination.  Review of Systems    No ROS.  Medicare Wellness Virtual Visit.  Visual/audio telehealth visit, UTA vital signs.   See social history for additional risk factors.   Cardiac Risk Factors include: advanced age (>59men, >44 women)     Objective:    Today's Vitals   08/06/20 1320  Weight: 120 lb (54.4 kg)  Height: 5\' 8"  (1.727 m)   Body mass index is 18.25 kg/m.  Advanced Directives 08/06/2020 01/30/2020 12/13/2019 07/15/2019 06/17/2019 06/06/2019 05/01/2019  Does Patient Have a Medical Advance Directive? Yes Yes Yes Yes Yes Yes Yes  Type of Paramedic of [place];Living will - Healthcare Power of [place];Living will [place];Living will [place];Living will [place];Living will  Does patient want to make changes to medical advance directive? No - Patient declined No - Patient declined No - Patient declined No - Patient declined No - Patient declined - No - Patient declined  Copy of [place] in Chart? Yes - validated most recent copy scanned in chart (See row information) - No - copy requested Yes - validated most recent copy scanned in chart (See row information) No - copy requested - No - copy requested  Would patient like information on creating a medical advance directive? - - - - - - -    Current Medications (verified) Outpatient Encounter Medications as of 08/06/2020  Medication Sig  . acetaminophen (TYLENOL) 500 MG tablet Take 500 mg by mouth every 8 (eight) hours as needed.  Marland Kitchen amitriptyline (ELAVIL) 10 MG tablet Take 1 tablet (10 mg total) by mouth at bedtime.  Marland Kitchen amLODipine (NORVASC) 2.5 MG tablet TAKE 1 TABLET(2.5 MG) BY MOUTH DAILY  . gabapentin (NEURONTIN) 300 MG capsule TAKE ONE CAPSULE BY MOUTH THREE TIMES DAILY AS NEEDED FOR  BACK PAIN  . losartan (COZAAR) 100 MG tablet TAKE 1 TABLET(100 MG) BY MOUTH AT BEDTIME  . Menthol, Topical Analgesic, (BIOFREEZE EX) Apply topically as needed.  . metoprolol succinate (TOPROL-XL) 25 MG 24 hr tablet TAKE 1 TABLET(25 MG) BY MOUTH DAILY  . oxyCODONE-acetaminophen (PERCOCET) 10-325 MG tablet Take 1 tablet by mouth every 4 (four) hours as needed for pain. Must last 30 days.  . polyethylene glycol (MIRALAX / GLYCOLAX) packet Take 17 g by mouth daily as needed for mild constipation.   . promethazine (PHENERGAN) 12.5 MG tablet TAKE 1 TABLET(12.5 MG) BY MOUTH EVERY 8 HOURS AS NEEDED FOR NAUSEA OR VOMITING  . rosuvastatin (CRESTOR) 5 MG tablet TAKE 1 TABLET(5 MG) BY MOUTH DAILY  . sucralfate (CARAFATE) 1 g tablet TAKE 1 TABLET BY MOUTH ONE HOUR BEFORE EACH MEAL AND 2 HOURS AFTER OTHER MEDICATIONS   Facility-Administered Encounter Medications as of 08/06/2020  Medication  . epirubicin (ELLENCE) 50 mg in sodium chloride 0.9 % bladder instillation  . gemcitabine (GEMZAR) chemo syringe for bladder instillation 2,000 mg    Allergies (verified) Patient has no known allergies.   History: Past Medical History:  Diagnosis Date  . AICD (automatic cardioverter/defibrillator) present EP cardiologist--- dr Caryl Comes    placement 05-07-2014 , ef 25%,  NICM/  (02-11-2019 last echo 08-31-2018 ef 60-65%)  . Arthritis    "in about all my joints; for sure in my back"  . Bladder cancer [facility]) urologist-  dr eskridge   dx 1995--  recurrent bladder cancer  2015 , s/p TURBT's and chemo instillation's ;   04/ 2019  s/p TURBT  . Chronic hyponatremia   . Chronic low back pain with bilateral sciatica    s/p  spinal cord stimulator @ [facility]  10-09-2018  . Chronic systolic (congestive) heart failure [facility])    cardiologist-  dr Rogue Jury  . COPD with emphysema ([place])    (02-11-2019  per pt has never been on oxygen)  . Coronary artery disease cardiologist-  dr Kathlyn [place]   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% per  cardiac cath 03-03-2014  . DDD (degenerative disc disease), thoracolumbar   . Frequent urination   . Full dentures   . Gait instability    due to chronic low back pain, uses roller walker  . GERD (gastroesophageal reflux disease)   . History of iron deficiency anemia 11/2013   resolved w/ IV Iron  (02-11-2019  per pt has not had any issues since 2015)  . History of stomach ulcers 11/2013  . Hypertension   . LBBB (left bundle branch block)   . NICM (nonischemic cardiomyopathy) ([place]) last echo 12-31-2015 ef 55-60%   dx 09/ 2015 per echo 20%;  myoview 09/ 2015 ef 28%;  per cardiac cath 12/ 2015 ef 30%;    . Nocturia more than twice per night   . S/P insertion of spinal cord stimulator followed by [facility]---- Dr Pecola Leisure (notes in care everywhere)   10-09-2018  @[facility] --- thoracic spinal cord stimular/ generator  (device from [facility])---- per pt has a control  . Scoliosis    Past Surgical History:  Procedure Laterality Date  . BI-VENTRICULAR IMPLANTABLE CARDIOVERTER DEFIBRILLATOR N/A 05/07/2014   Procedure: BI-VENTRICULAR IMPLANTABLE CARDIOVERTER DEFIBRILLATOR  (CRT-D);  Surgeon: Deboraha Sprang, MD;  Location: [facility] CATH LAB;  Service: Cardiovascular;  Laterality: N/A;  . CARDIAC CATHETERIZATION  03-03-2014  dr Kathlyn [place]   [facility]   pLAD 20%, pRCA 20%, dRCA 50%, RPLS 50%;  ef 30%, mild elevated LVEDP, mild gradient across aortic valve LVOT  . CARDIOVASCULAR STRESS TEST  11/25/2013   High risk nuclear study w/ large high severity inferior wall perfusion defect on stress and rest images, large mild severity anteroseptal wall perfusion defect on stress and rest images, No inducible ischemia/ global moderate hypokinesis, ef 28%  . CATARACT EXTRACTION W/ INTRAOCULAR LENS  IMPLANT, BILATERAL Bilateral right 12-2013 / left  02-2014  . CYSTOSCOPY W/ RETROGRADES Bilateral 01/06/2015   Procedure: CYSTOSCOPY WITH  BLADDER BIOPSY BILATERAL RETROGRADE PYELOGRAM,INSTILLATION OF MITOMYCIN C;  Surgeon:  [name], MD;  Location: [facility];  Service: Urology;  Laterality: Bilateral;  . CYSTOSCOPY WITH BIOPSY N/A 10/06/2015   Procedure: CYSTO WITH BLADDER BIOPSY, FULGERATION, CHEMO IRRIGATION EPIRUBICIN IN PACU;  Surgeon: [name], MD;  Location: [facility];  Service: Urology;  Laterality: N/A;  . CYSTOSCOPY WITH BIOPSY N/A 02/12/2019   Procedure: CYSTOSCOPY WITH BIOPSY/ FULGURATION/ INSTILLATION OF GEMCITABINE, bilateral retrograde turbt greater 5cm;  Surgeon: [name], MD;  Location: [facility];  Service: Urology;  Laterality: N/A;  . CYSTOSCOPY WITH FULGERATION N/A 06/30/2017   Procedure: Marland Kitchen CYSTOSCOPY WITH [place] FULGERATION/ BLADDER BIOPSY/ INSTILLATION OF EPIRUBICIN;  Surgeon: [name], MD;  Location: [facility];  Service: Urology;  Laterality: N/A;  . CYSTOSCOPY WITH RETROGRADE PYELOGRAM, URETEROSCOPY AND STENT PLACEMENT Bilateral 04/18/2014   Procedure: CYSTOSCOPY WITH RETROGRADE PYELOGRAM;  Surgeon: [name], MD;  Location: [facility];  Service: Urology;  Laterality: Bilateral;  . ESOPHAGOGASTRODUODENOSCOPY N/A 11/28/2013  Procedure: ESOPHAGOGASTRODUODENOSCOPY (EGD);  Surgeon: Arta Silence, MD;  Location: [facility] ENDOSCOPY;  Service: Endoscopy;  Laterality: N/A;  . LUMBAR [place]  1980's   "ruptured disc"  . 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 TRANSTHORACIC ECHOCARDIOGRAM  12/31/2015   dr Caryl Comes   ef 97-673, grade 1 diastolic dysfunction/ mild MR/ septal motion showed abnormal function and dyssynergy  . TRANSURETHRAL RESECTION OF BLADDER  1995  . TRANSURETHRAL RESECTION OF BLADDER TUMOR N/A 12/13/2013   Procedure: TRANSURETHRAL RESECTION OF BLADDER TUMOR (TURBT);  Surgeon: [name], MD;  Location: [facility];  Service: Urology;  Laterality: N/A;  . TRANSURETHRAL RESECTION OF BLADDER TUMOR N/A 01/17/2014    Procedure: TRANSURETHRAL RESECTION OF BLADDER TUMOR (TURBT);  Surgeon: [name], MD;  Location: [facility];  Service: Urology;  Laterality: N/A;  . TRANSURETHRAL RESECTION OF BLADDER TUMOR N/A 04/18/2014   Procedure: TRANSURETHRAL RESECTION OF BLADDER TUMOR (TURBT), CYSTOGRAM;  Surgeon: [name], MD;  Location: [facility];  Service: Urology;  Laterality: N/A;  . TRANSURETHRAL RESECTION OF BLADDER TUMOR N/A 12/13/2019   Procedure: TRANSURETHRAL RESECTION OF BLADDER TUMOR (TURBT) GREATER THAN 5CM WITH CYSTOSCOPY/ POST OPERATIVE INSTILLATION OF GEMCITABINE;  Surgeon: [name], MD;  Location: [facility];  Service: Urology;  Laterality: N/A;   Family History  Problem Relation Age of Onset  . Cancer Mother   . Cancer Brother    Social History   Socioeconomic History  . Marital status: Widowed    Spouse name: Not on file  . Number of children: Not on file  . Years of education: Not on file  . Highest education level: Not on file  Occupational History  . Not on file  Tobacco Use  . Smoking status: Current Every Day Smoker    Packs/day: 0.50    Years: 56.00    Pack years: 28.00    Types: Cigarettes  . Smokeless tobacco: Never Used  . Tobacco comment: 02-11-2019  per pt currently smokes 10 cig's per day , started smoking age 31  Vaping Use  . Vaping Use: Never used  Substance and Sexual Activity  . Alcohol use: No  . Drug use: No  . Sexual activity: Never  Other Topics Concern  . Not on file  Social History Narrative  . Not on file   Social Determinants of Health   Financial Resource Strain: Low Risk   . Difficulty of Paying Living Expenses: Not hard at all  Food Insecurity: No Food Insecurity  . Worried About Charity fundraiser in the Last Year: Never true  . Ran Out of Food in the Last Year: Never true  Transportation Needs: No Transportation Needs  . Lack of Transportation (Medical): No  . Lack of Transportation (Non-Medical): No  Physical  Activity: Sufficiently Active  . Days of Exercise per Week: 7 days  . Minutes of Exercise per Session: 30 min  Stress: No Stress Concern Present  . Feeling of Stress : Not at all  Social Connections: Unknown  . Frequency of Communication with Friends and Family: More than three times a week  . Frequency of Social Gatherings with Friends and Family: More than three times a week  . Attends Religious Services: Not on file  . Active Member of Clubs or Organizations: Not on file  . Attends Archivist Meetings: Not on file  . Marital Status: Not on file  Tobacco Counseling Ready to quit: Not Answered Counseling given: Not Answered Comment: 02-11-2019  per pt currently smokes 10 cig's per day , started smoking age 60   Clinical Intake:  Pre-visit preparation completed: Yes        Diabetes: No  How often do you need to have someone help you when you read instructions, pamphlets, or other written materials from your doctor or pharmacy?: 1 - Never   Interpreter Needed?: No      Activities of Daily Living In your present state of health, do you have any difficulty performing the following activities: 08/06/2020 12/13/2019  Hearing? N -  Vision? N -  Difficulty concentrating or making decisions? N -  Walking or climbing stairs? N -  Dressing or bathing? N -  Doing errands, shopping? N N  Preparing Food and eating ? Y -  Comment Caregiver assist with meal prep -  Using the Toilet? N -  In the past six months, have you accidently leaked urine? Y -  Comment Managed with daily pad/brief -  Do you have problems with loss of bowel control? N -  Managing your Medications? N -  Managing your Finances? N -  Housekeeping or managing your Housekeeping? Y -  Comment Caregiver assist -  Some recent data might be hidden    Patient Care Team: [name], MD as PCP - General (Internal Medicine) Briscoe Deutscher, MD (Family Medicine)  Indicate any recent Medical Services  you may have received from other than [facility] providers in the past year (date may be approximate).     Assessment:   This is a routine wellness examination for [place].  I connected with [place] today by telephone and verified that I am speaking with the correct person using two identifiers. Location patient: home Location provider: work Persons participating in the virtual visit: patient, Marine scientist.    I discussed the limitations, risks, security and privacy concerns of performing an evaluation and management service by telephone and the availability of in person appointments. The patient expressed understanding and verbally consented to this telephonic visit.    Interactive audio and video telecommunications were attempted between this provider and patient, however failed, due to patient having technical difficulties OR patient did not have access to video capability.  We continued and completed visit with audio only.  Some vital signs may be absent or patient reported.   Hearing/Vision screen  Hearing Screening   125Hz  250Hz  500Hz  1000Hz  2000Hz  3000Hz  4000Hz  6000Hz  8000Hz   Right ear:           Left ear:           Comments: Patient is able to hear conversational tones without difficulty.  No issues reported.  Vision Screening Comments: Followed by [facility]  Wears corrective lenses when reading  Cataract extraction, bilateral   Dietary issues and exercise activities discussed: Current Exercise Habits: Home exercise routine, Type of exercise: stretching;walking, Intensity: Mild  Healthy diet Good water intake  Goals Addressed              This Visit's Progress     Patient Stated   .  Increase physical activity (pt-stated)        Stay active      Depression Screen PHQ 2/9 Scores 08/06/2020 11/13/2019 07/15/2019 07/09/2018 05/08/2018 02/06/2018 12/12/2017  PHQ - 2 Score 0 0 0 0 0 0 0    Fall Risk Fall Risk  08/06/2020 05/07/2020 01/30/2020 11/13/2019 09/20/2019  Falls in  the past year? 0 0 0 0 0  Number falls in past yr: 0 - - - -  Injury with Fall? 0 - - - -  Risk for fall due to : - - - - -  Follow up Falls evaluation completed - - - Falls evaluation completed    [place]: Handrails in use when climbing stairs? Yes Home free of loose throw rugs in walkways, pet beds, electrical cords, etc? Yes  Adequate lighting in your home to reduce risk of falls? Yes   ASSISTIVE DEVICES UTILIZED TO PREVENT FALLS: Life alert? Yes  Use of a cane, walker or w/c? Yes  Grab bars in the bathroom? Yes  Shower chair or bench in shower? Yes  Elevated toilet seat or a handicapped toilet? Yes   TIMED UP AND GO: Was the test performed? Yes .   Cognitive Function:  Patient is alert and oriented x3.  Denies difficulty focusing, making decisions, memory loss.  MMSE/6CIT deferred. Normal by direct communication/observation.    6CIT Screen 07/15/2019 07/09/2018 06/09/2017 05/13/2016  What Year? 0 points 0 points 0 points 0 points  What month? 0 points 0 points 0 points 0 points  What time? 0 points 0 points 0 points 0 points  Count back from 20 - 0 points 0 points 0 points  Months in reverse 0 points 0 points 0 points 0 points  Repeat phrase 0 points 0 points 0 points 0 points  Total Score - 0 0 0    Immunizations Immunization History  Administered Date(s) Administered  . Fluad Quad(high Dose 65+) 12/10/2018, 01/13/2020  . Influenza, High Dose Seasonal PF 01/29/2018  . Influenza,inj,Quad PF,6+ Mos 12/27/2013  . Influenza-Unspecified 12/24/2015, 02/17/2017  . Moderna Sars-Covid-2 Vaccination 10/21/2019, 11/18/2019  . Pneumococcal Conjugate-13 12/27/2013  . Pneumococcal Polysaccharide-23 06/07/2018    TDAP status: Due, Education has been provided regarding the importance of this vaccine. Advised may receive this vaccine at local pharmacy or Health Dept. Aware to provide a copy of the vaccination record if obtained from local pharmacy  or Health Dept. Verbalized acceptance and understanding.  Deferred.   Health Maintenance Health Maintenance  Topic Date Due  . COVID-19 Vaccine (3 - Moderna risk 4-dose series) 08/22/2020 (Originally 12/16/2019)  . TETANUS/TDAP  08/06/2021 (Originally 03/21/1959)  . INFLUENZA VACCINE  10/19/2020  . DEXA SCAN  Completed  . PNA vac Low Risk Adult  Completed  . HPV VACCINES  Aged Out   Colorectal cancer screening: No longer required.   Mammogram status: No longer required due to patient preference. Declined.   Lung Cancer Screening: (Low Dose CT Chest recommended if Age 48-80 years, 30 pack-year currently smoking OR have quit w/in 15years.) does not qualify.    Hepatitis C Screening: does not qualify.  Vision Screening: Recommended annual ophthalmology exams for early detection of glaucoma and other disorders of the eye. Is the patient up to date with their annual eye exam?  Yes   Dental Screening: Recommended annual dental exams for proper oral hygiene. Dentures.   Community Resource Referral / Chronic Care Management: CRR required this visit?  No   CCM required this visit?  No      Plan:    Keep all routine maintenance appointments.   I have personally reviewed and noted the following in the patient's chart:   . Medical and social history . Use of alcohol, tobacco or illicit drugs  . Current medications and supplements including opioid prescriptions. Patient currently  taking opioid to manage chronic pain. Managed by [name], MD . Functional ability and status . Nutritional status . Physical activity . Advanced directives . List of other physicians . Hospitalizations, surgeries, and ER visits in previous 12 months . Vitals . Screenings to include cognitive, depression, and falls . Referrals and appointments  In addition, I have reviewed and discussed with patient certain preventive protocols, quality metrics, and best practice recommendations. A written personalized  care plan for preventive services as well as general preventive health recommendations were provided to patient via mychart.     Varney Biles, LPN   07/05/3843

## 2020-08-06 NOTE — Patient Instructions (Addendum)
[name] , Thank you for taking time to come for your Medicare Wellness Visit. I appreciate your ongoing commitment to your health goals. Please review the following plan we discussed and let me know if I can assist you in the future.   These are the goals we discussed: Goals      Patient Stated   .  Increase physical activity (pt-stated)      Stay active       This is a list of the screening recommended for you and due dates:  Health Maintenance  Topic Date Due  . COVID-19 Vaccine (3 - Moderna risk 4-dose series) 08/22/2020*  . Tetanus Vaccine  08/06/2021*  . Flu Shot  10/19/2020  . DEXA scan (bone density measurement)  Completed  . Pneumonia vaccines  Completed  . HPV Vaccine  Aged Out  *Topic was postponed. The date shown is not the original due date.    Immunizations Immunization History  Administered Date(s) Administered  . Fluad Quad(high Dose 65+) 12/10/2018, 01/13/2020  . Influenza, High Dose Seasonal PF 01/29/2018  . Influenza,inj,Quad PF,6+ Mos 12/27/2013  . Influenza-Unspecified 12/24/2015, 02/17/2017  . Moderna Sars-Covid-2 Vaccination 10/21/2019, 11/18/2019  . Pneumococcal Conjugate-13 12/27/2013  . Pneumococcal Polysaccharide-23 06/07/2018   Opioid Pain Medicine Management Opioid pain medicines are strong medicines that are used to treat bad or very bad pain. When you take them for a short time, they can help you:  Sleep better.  Do better in physical therapy.  Feel better during the first few days after you get hurt.  Recover from surgery. Only take these medicines if a doctor says that you can. You should only take them for a short time. This is because opioids can be hard to stop taking (they are addictive). The longer you take opioids, the harder it may be to stop taking them (opioid use disorder). What are the risks? Opioids can cause problems (side effects). Taking them for more than 3 days raises your chance of problems, such as:  Trouble  pooping (constipation).  Feeling sick to your stomach (nausea).  Vomiting.  Feeling very sleepy.  Confusion.  Not being able to stop taking the medicine.  Breathing problems. Taking opioids for a long time can make it hard for you to do daily tasks. It can also put you at risk for:  Car accidents.  Depression.  Suicide.  Heart attack.  Taking too much of the medicine (overdose), which can sometimes lead to death. What is a pain treatment plan? A pain treatment plan is a plan made by you and your doctor. Work with your doctor to make a plan for treating your pain. To help you do this:  Talk about the goals of your treatment, including: ? How much pain you might expect to have. ? How you will manage the pain.  Talk about the risks and benefits of taking these medicines for your condition.  Remember that a good treatment plan uses more than one approach and lowers the risks of side effects.  Tell your doctor about the amount of medicines you take and about any drug or alcohol use.  Get your pain medicine prescriptions from only one doctor. Pain can be managed with other treatments. Work with your doctor to find other ways to help your pain, such as:  Physical therapy.  Counseling.  Eating healthy foods.  Brain exercises.  Massage.  Meditation.  Other pain medicines.  Doing gentle exercises. Tapering your use of opioids If you have  been taking opioids for more than a few weeks, you may need to slowly decrease (taper) how much you take until you stop taking them. Doing this can lower your chance of having symptoms, such as:  Pain and cramping in your belly (abdomen).  Feeling sick to your stomach.  Sweating.  Feeling very sleepy.  Feeling restless.  Shaking you cannot control (tremors).  Cravings for the medicine. Do not try to stop taking them by yourself. Work with your doctor to stop. Your doctor will help you take less until you are not taking the  medicine at all. Follow these instructions at home: Safety and storage  While you are taking opioids: ? Do not drive. ? Do not use machines or power tools. ? Do not sign important papers (legal documents). ? Do not drink alcohol. ? Do not take sleeping pills. ? Do not take care of children by yourself. ? Do not do activities where you need to climb or be in high places, like working on a ladder. ? Do not go into any water, such as a lake, river, ocean, swimming pool, or hot tub.  Keep your opioids locked up or in a place where children cannot reach them.  Do not share your pain medicine with anyone.   Getting rid of leftover pills Do not save any leftover pills. Get rid of leftover pills safely by:  Taking them to a take-back program in your area.  Bringing them to a pharmacy that has a container for throwing away pills (pill disposal).  Flushing them down the toilet. Check the label or package insert of your medicine to see whether this is safe to do.  Throwing them in the trash. Check the label or package insert of your medicine to see whether this is safe to do. If it is safe to throw them out: 1. Take the pills out of their container. 2. Put the pills into a container you can seal. 3. Mix the pills with used coffee grounds, food scraps, dirt, or cat litter. 4. Put this in the trash. Activity  Return to your normal activities as told by your doctor. Ask your doctor what activities are safe for you.  Avoid doing things that make your pain worse.  Do exercises as told by your doctor. General instructions  You may need to take these actions to prevent or treat trouble pooping: ? Drink enough fluid to keep your pee (urine) pale yellow. ? Take over-the-counter or prescription medicines. ? Eat foods that are high in fiber. These include beans, whole grains, and fresh fruits and vegetables. ? Limit foods that are high in fat and sugar. These include fried or sweet  foods.  Keep all follow-up visits as told by your doctor. This is important. Where to find support If you have been taking opioids for a long time, think about getting help quitting from a local support group or counselor. Ask your doctor about this. Where to find more information  Centers for Disease Control and Prevention (CDC): http://www.wolf.info/  [place] Food and Drug Administration (FDA): [employer] Get help right away if: Seek medical care right away if you are taking opioids and you, or people close to you, notice any of the following:  You have trouble breathing.  Your breathing is slower or more shallow than normal.  You have a very slow heartbeat.  You feel very confused.  You pass out (faint).  You are very sleepy.  Your speech is not normal.  You  feel sick to your stomach and vomit.  You have cold skin.  You have blue lips or fingernails.  Your muscles are weak (limp) and your body seems floppy.  The black centers of your eyes (pupils) are smaller than normal. If you think that you or someone else may have taken too much of an opioid medicine, get medical help right away. Call your local emergency services (911 in the [place] Do not drive yourself to the hospital. If you ever feel like you may hurt yourself or others, or have thoughts about taking your own life, get help right away. You can go to your nearest emergency department or call:  Your local emergency services (911 in the [place]  The hotline of the [facility] (475)584-2851 in the [place]  A suicide crisis helpline, such as the [place] at 314-538-3104. This is open 24 hours a day. Summary  Opioid are strong medicines that are used to treat bad or very bad pain.  A pain treatment plan is a plan made by you and your doctor. Work with your doctor to make a plan for treating your pain.  Work with your doctor to find other ways to help your pain.  If you  think that you or someone else may have taken too much of an opioid, get help right away. This information is not intended to replace advice given to you by your health care provider. Make sure you discuss any questions you have with your health care provider. Document Revised: 01/11/2019 Document Reviewed: 04/06/2018 Elsevier Patient Education  2021 [employer].

## 2020-08-11 ENCOUNTER — Other Ambulatory Visit: Payer: Self-pay

## 2020-08-11 ENCOUNTER — Ambulatory Visit
Payer: Medicare Other | Attending: Student in an Organized Health Care Education/Training Program | Admitting: Student in an Organized Health Care Education/Training Program

## 2020-08-11 ENCOUNTER — Encounter: Payer: Self-pay | Admitting: Student in an Organized Health Care Education/Training Program

## 2020-08-11 VITALS — BP 157/100 | HR 74 | Temp 97.0°F | Resp 16 | Ht 68.0 in | Wt 120.0 lb

## 2020-08-11 DIAGNOSIS — Z9689 Presence of other specified functional implants: Secondary | ICD-10-CM | POA: Diagnosis not present

## 2020-08-11 DIAGNOSIS — M5416 Radiculopathy, lumbar region: Secondary | ICD-10-CM | POA: Diagnosis not present

## 2020-08-11 DIAGNOSIS — M48062 Spinal stenosis, lumbar region with neurogenic claudication: Secondary | ICD-10-CM | POA: Insufficient documentation

## 2020-08-11 DIAGNOSIS — M47816 Spondylosis without myelopathy or radiculopathy, lumbar region: Secondary | ICD-10-CM | POA: Insufficient documentation

## 2020-08-11 DIAGNOSIS — M5136 Other intervertebral disc degeneration, lumbar region: Secondary | ICD-10-CM | POA: Insufficient documentation

## 2020-08-11 DIAGNOSIS — G894 Chronic pain syndrome: Secondary | ICD-10-CM | POA: Diagnosis not present

## 2020-08-11 DIAGNOSIS — M47818 Spondylosis without myelopathy or radiculopathy, sacral and sacrococcygeal region: Secondary | ICD-10-CM | POA: Insufficient documentation

## 2020-08-11 MED ORDER — OXYCODONE-ACETAMINOPHEN 10-325 MG PO TABS: 1.0000 | ORAL_TABLET | ORAL | 0 refills | Status: AC | PRN

## 2020-08-11 MED ORDER — GABAPENTIN 300 MG PO CAPS: ORAL_CAPSULE | ORAL | 1 refills | Status: DC

## 2020-08-11 NOTE — Progress Notes (Signed)
Nursing Pain Medication Assessment:  Safety precautions to be maintained throughout the outpatient stay will include: orient to surroundings, keep bed in low position, maintain call bell within reach at all times, provide assistance with transfer out of bed and ambulation.  Medication Inspection Compliance: Pill count conducted under aseptic conditions, in front of the patient. Neither the pills nor the bottle was removed from the patient's sight at any time. Once count was completed pills were immediately returned to the patient in their original bottle.  Medication: Oxycodone/APAP Pill/Patch Count: 0 of 150 pills remain Pill/Patch Appearance: Markings consistent with prescribed medication Bottle Appearance: Standard pharmacy container. Clearly labeled. Filled Date: 07/11/2020 Last Medication intake:  Today

## 2020-08-11 NOTE — Progress Notes (Signed)
PROVIDER NOTE: Information contained herein reflects review and annotations entered in association with encounter. Interpretation of such information and data should be left to medically-trained personnel. Information provided to patient can be located elsewhere in the medical record under "Patient Instructions". Document created using STT-dictation technology, any transcriptional errors that may result from process are unintentional.    Patient: [name]  Service Category: E/M  Provider: [name], MD  DOB: 10/13/1940  DOS: 08/11/2020  Specialty: Interventional Pain Management  MRN: [id number]  Setting: Ambulatory outpatient  PCP: [name], MD  Type: Established Patient    Referring Provider: [name], MD  Location: Office  Delivery: Face-to-face     HPI  [name], a 80 y.o. year old female, is here today because of her Chronic pain syndrome [G89.4]. [name] primary complain today is Back Pain (lower) Last encounter: My last encounter with her was on 05/07/20 Pertinent problems: [name] has Spinal stenosis of thoracolumbar region; Spinal stenosis in cervical region; Spinal stenosis, lumbar region, with neurogenic claudication; Lumbar spondylosis; SI joint arthritis; Lumbar degenerative disc disease; Chronic pain syndrome; Lumbar radiculopathy; Lumbar facet arthropathy; and Back pain on their pertinent problem list. Pain Assessment: Severity of   is reported as a 7 /10. Location: Back Lower/both buttocks, both upper legs. Onset: More than a month ago. Quality: Aching. Timing: Constant. Modifying factor(s): medications. Vitals:  height is $RemoveB'5\' 8"'YgWHPAkG$  (1.727 m) and weight is 120 lb (54.4 kg). Her temporal temperature is 97 F (36.1 C) (abnormal). Her blood pressure is 157/100 (abnormal) and her pulse is 74. Her respiration is 16 and oxygen saturation is 100%.   Reason for encounter: medication management.    No change in medical history since last visit.  Patient's pain is at  baseline.  Patient continues multimodal pain regimen as prescribed.  States that it provides pain relief and improvement in functional status. Pt would like for me to manage her Gabapentin, I'll send in refill Patient did see Merla Riches to optimize her SCS settings. Is requesting increase in her Percocet which I do not recommend as patient is already on high dose.  We discussed the phenomena of central sensitization and opioid-induced hyperalgesia and I recommend the patient consider a drug holiday to decrease her tolerance.  I do not recommend dose escalation.  Pharmacotherapy Assessment   Analgesic: Percocet 10 mg up to 5 times a day as needed for severe breakthrough pain, quantity 150/month; MME equals 75   Monitoring: [place] PMP: PDMP reviewed during this encounter.       Pharmacotherapy: No side-effects or adverse reactions reported. Compliance: No problems identified. Effectiveness: Clinically acceptable.  Landis Martins, RN  08/11/2020 10:54 AM  Sign when Signing Visit Nursing Pain Medication Assessment:  Safety precautions to be maintained throughout the outpatient stay will include: orient to surroundings, keep bed in low position, maintain call bell within reach at all times, provide assistance with transfer out of bed and ambulation.  Medication Inspection Compliance: Pill count conducted under aseptic conditions, in front of the patient. Neither the pills nor the bottle was removed from the patient's sight at any time. Once count was completed pills were immediately returned to the patient in their original bottle.  Medication: Oxycodone/APAP Pill/Patch Count: 0 of 150 pills remain Pill/Patch Appearance: Markings consistent with prescribed medication Bottle Appearance: Standard pharmacy container. Clearly labeled. Filled Date: 07/11/2020 Last Medication intake:  Today    UDS:  Summary  Date Value Ref Range Status  08/13/2019 Note  Final    Comment:     ==================================================================== [employer] [facility] (MW) ==================================================================== Test                             Result       Flag       Units Drug Present and Declared for Prescription Verification   Oxycodone                      3800         EXPECTED   ng/mg creat   Oxymorphone                    10426        EXPECTED   ng/mg creat   Noroxycodone                   10739        EXPECTED   ng/mg creat   Noroxymorphone                 2334         EXPECTED   ng/mg creat    Sources of oxycodone are scheduled prescription medications.    Oxymorphone, noroxycodone, and noroxymorphone are expected    metabolites of oxycodone. Oxymorphone is also available as a    scheduled prescription medication. ==================================================================== Test                      Result    Flag   Units      Ref Range   Creatinine              38               mg/dL      >=20 ==================================================================== Declared Medications:  The flagging and interpretation on this report are based on the  following declared medications.  Unexpected results may arise from  inaccuracies in the declared medications.  **Note: The testing scope of this panel includes these medications:  Oxycodone (Percocet)  **Note: The testing scope of this panel does not include the  following reported medications:  Acetaminophen (Tylenol)  Acetaminophen (Percocet)  Amitriptyline (Elavil)  Amlodipine (Norvasc)  Gabapentin (Neurontin)  Hyoscyamine (Uribel)  Losartan (Cozaar)  Methenamine (Uribel)  Methylene Blue (Uribel)  Metoprolol (Toprol)  Pantoprazole (Protonix)  Phenazopyridine (Pyridium)  Phenyl salicylate (Uribel)  Polyethylene Glycol (MiraLAX)  Promethazine (Phenergan)  Rosuvastatin (Crestor)  Sodium phosphate, monobasic (Uribel)  Solifenacin (Vesicare)  Vitamin D2  (Drisdol) ==================================================================== For clinical consultation, please call [phone]. ====================================================================      ROS  Constitutional: Denies any fever or chills Gastrointestinal: No reported hemesis, hematochezia, vomiting, or acute GI distress Musculoskeletal: +LBP, b/l leg pain Neurological: No reported episodes of acute onset apraxia, aphasia, dysarthria, agnosia, amnesia, paralysis, loss of coordination, or loss of consciousness  Medication Review  Menthol (Topical Analgesic), acetaminophen, amLODipine, amitriptyline, gabapentin, losartan, metoprolol succinate, oxyCODONE-acetaminophen, polyethylene glycol, promethazine, rosuvastatin, and sucralfate  History Review  Allergy: [name] has No Known Allergies. Drug: [name]  reports no history of drug use. Alcohol:  reports no history of alcohol use. Tobacco:  reports that she has been smoking cigarettes. She has a 28.00 pack-year smoking history. She has never used smokeless tobacco. Social: [name]  reports that she has been smoking cigarettes. She has a 28.00 pack-year smoking history. She has never used smokeless tobacco.  She reports that she does not drink alcohol and does not use drugs. Medical:  has a past medical history of AICD (automatic cardioverter/defibrillator) present (EP cardiologist--- dr Caryl Comes ), Arthritis, Bladder cancer [facility]) (urologist-  dr Junious Silk), Chronic hyponatremia, Chronic low back pain with bilateral sciatica, Chronic systolic (congestive) heart failure ([place]), COPD with emphysema ([place]), Coronary artery disease (cardiologist-  dr Kathlyn Sacramento), DDD (degenerative disc disease), thoracolumbar, Frequent urination, Full dentures, Gait instability, GERD (gastroesophageal reflux disease), History of iron deficiency anemia (11/2013), History of stomach ulcers (11/2013), Hypertension, LBBB (left bundle branch block), NICM  (nonischemic cardiomyopathy) ([place]) (last echo 12-31-2015 ef 55-60%), Nocturia more than twice per night, S/P insertion of spinal cord stimulator (followed by [facility]---- Dr Pecola Leisure (notes in care everywhere)), and Scoliosis. Surgical: [name]  has a past surgical history that includes Esophagogastroduodenoscopy (N/A, 11/28/2013); Transurethral resection of bladder tumor (N/A, 12/13/2013); Transurethral resection of bladder tumor (N/A, 01/17/2014); Cataract extraction w/ intraocular lens  implant, bilateral (Bilateral, right 12-2013 / left  02-2014); Cystoscopy with retrograde pyelogram, ureteroscopy and stent placement (Bilateral, 04/18/2014); Transurethral resection of bladder tumor (N/A, 04/18/2014); Lumbar disc surgery (1980's); bi-ventricular implantable cardioverter defibrillator (N/A, 05/07/2014); Cystoscopy w/ retrogrades (Bilateral, 01/06/2015); Cystoscopy with biopsy (N/A, 10/06/2015); Cardiac catheterization (03-03-2014  dr Kathlyn Sacramento   [facility]); Transurethral resection of bladder (1995); Cardiovascular stress test (11/25/2013); transthoracic echocardiogram (12/31/2015   dr Caryl Comes); Cystoscopy with fulgeration (N/A, 06/30/2017); Spinal cord stimulator implant (10-09-2018   '@[facility]'$ ); Cystoscopy with biopsy (N/A, 02/12/2019); and Transurethral resection of bladder tumor (N/A, 12/13/2019). Family: family history includes Cancer in her brother and mother.  Laboratory Chemistry Profile   Renal Lab Results  Component Value Date   BUN 10 12/13/2019   CREATININE 0.70 12/13/2019   BCR 18 04/28/2014   GFR 89.19 08/27/2019   GFRAA >60 06/17/2019   GFRNONAA >60 06/17/2019     Hepatic Lab Results  Component Value Date   AST 38 (H) 08/27/2019   ALT 32 08/27/2019   ALBUMIN 4.5 08/27/2019   ALKPHOS 83 08/27/2019   LIPASE 30 11/23/2013     Electrolytes Lab Results  Component Value Date   NA 137 12/13/2019   K 3.6 12/13/2019   CL 96 (L) 12/13/2019   CALCIUM 9.8 08/27/2019   MG 2.3 06/03/2019      Bone Lab Results  Component Value Date   VD25OH 36.17 01/11/2019     Inflammation (CRP: Acute Phase) (ESR: Chronic Phase) No results found for: CRP, ESRSEDRATE, LATICACIDVEN     Note: Above Lab results reviewed.  Recent Imaging Review  CUP PACEART REMOTE DEVICE CHECK Scheduled remote reviewed. Normal device function.   1 NSVT event lasting 7 beats w/ rate 260's bpm  Next remote 91 days. HB Note: Reviewed        Physical Exam  General appearance: Well nourished, well developed, and well hydrated. In no apparent acute distress Mental status: Alert, oriented x 3 (person, place, & time)       Respiratory: No evidence of acute respiratory distress Eyes: PERLA Vitals: BP (!) 157/100   Pulse 74   Temp (!) 97 F (36.1 C) (Temporal)   Resp 16   Ht $R'5\' 8"'JN$  (1.727 m)   Wt 120 lb (54.4 kg)   SpO2 100%   BMI 18.25 kg/m  BMI: Estimated body mass index is 18.25 kg/m as calculated from the following:   Height as of this encounter: $RemoveBeforeD'5\' 8"'IZytlAEASwQwjv$  (1.727 m).   Weight as of this encounter: 120 lb (54.4  kg). Ideal: Ideal body weight: 63.9 kg (140 lb 14 oz)   Lumbar Exam  Skin & Axial Inspection:Well healed scar from previous spine surgery detected, IPG for SCS in place Alignment:Symmetrical Functional GEZ:MOQH restricted ROM Stability:No instability detected Muscle Tone/Strength:Functionally intact. No obvious neuro-muscular anomalies detected. Sensory (Neurological):Dermatomal pain pattern Palpation:No palpable anomalies   Gait & Posture Assessment  Ambulation:Patient ambulates using a walker Gait:Limited. Using assistive device to ambulate Posture:Difficulty standing up straight, due to pain  Lower Extremity Exam    Side:Right lower extremity  Side:Left lower extremity  Stability:No instability observed  Stability:No instability observed  Skin & Extremity Inspection:Skin color, temperature, and hair growth are WNL. No peripheral  edema or cyanosis. No masses, redness, swelling, asymmetry, or associated skin lesions. No contractures.  Skin & Extremity Inspection:Skin color, temperature, and hair growth are WNL. No peripheral edema or cyanosis. No masses, redness, swelling, asymmetry, or associated skin lesions. No contractures.  Functional UTM:LYYT restricted ROMfor all joints of the lower extremity   Functional KPT:WSFK restricted ROMfor all joints of the lower extremity   Muscle Tone/Strength:Functionally intact. No obvious neuro-muscular anomalies detected.  Muscle Tone/Strength:Functionally intact. No obvious neuro-muscular anomalies detected.  Sensory (Neurological):Dermatomal pain pattern  Sensory (Neurological):Dermatomal pain pattern  DTR: Patellar:deferred today Achilles:deferred today Plantar:deferred today  DTR: Patellar:deferred today Achilles:deferred today Plantar:deferred today  Palpation:No palpable anomalies  Palpation:No palpable anomalies      Assessment   Status Diagnosis  Persistent Persistent Persistent 1. Chronic pain syndrome   2. Lumbar radiculopathy   3. Lumbar degenerative disc disease   4. Spinal stenosis, lumbar region, with neurogenic claudication   5. Spinal cord stimulator status   6. Lumbar facet arthropathy   7. SI joint arthritis       Plan of Care   [name] has a current medication list which includes the following long-term medication(s): amitriptyline, amlodipine, losartan, metoprolol succinate, promethazine, rosuvastatin, sucralfate, and gabapentin.  Pharmacotherapy (Medications Ordered): Meds ordered this encounter  Medications  . oxyCODONE-acetaminophen (PERCOCET) 10-325 MG tablet    Sig: Take 1 tablet by mouth every 4 (four) hours as needed for pain. Must last 30 days.    Dispense:  150 tablet    Refill:  0    Chronic Pain. (STOP Act - Not applicable). Fill one day early if closed on scheduled  refill date. Not to exceed #150/month.  . oxyCODONE-acetaminophen (PERCOCET) 10-325 MG tablet    Sig: Take 1 tablet by mouth every 4 (four) hours as needed for pain. Must last 30 days.    Dispense:  150 tablet    Refill:  0    Chronic Pain. (STOP Act - Not applicable). Fill one day early if closed on scheduled refill date. Not to exceed #150/month.  . oxyCODONE-acetaminophen (PERCOCET) 10-325 MG tablet    Sig: Take 1 tablet by mouth every 4 (four) hours as needed for pain. Must last 30 days.    Dispense:  150 tablet    Refill:  0    Chronic Pain. (STOP Act - Not applicable). Fill one day early if closed on scheduled refill date. Not to exceed #150/month.  . gabapentin (NEURONTIN) 300 MG capsule    Sig: TAKE ONE CAPSULE BY MOUTH THREE TIMES DAILY AS NEEDED FOR BACK PAIN    Dispense:  270 capsule    Refill:  1   Follow-up plan:   Return in about 3 months (around 11/11/2020) for Medication Management, in person.   Recent Visits No visits were found meeting  these conditions. Showing recent visits within past 90 days and meeting all other requirements Today's Visits Date Type Provider Dept  08/11/20 Office Visit [name], MD [facility]  Showing today's visits and meeting all other requirements Future Appointments No visits were found meeting these conditions. Showing future appointments within next 90 days and meeting all other requirements  I discussed the assessment and treatment plan with the patient. The patient was provided an opportunity to ask questions and all were answered. The patient agreed with the plan and demonstrated an understanding of the instructions.  Patient advised to call back or seek an in-person evaluation if the symptoms or condition worsens.  Duration of encounter: 30 minutes.  Note by: [name], MD Date: 08/11/2020; Time: 11:41 AM

## 2020-08-19 LAB — TOXASSURE SELECT 13 (MW), URINE

## 2020-08-26 ENCOUNTER — Ambulatory Visit (INDEPENDENT_AMBULATORY_CARE_PROVIDER_SITE_OTHER): Payer: Medicare Other

## 2020-08-26 DIAGNOSIS — I428 Other cardiomyopathies: Secondary | ICD-10-CM | POA: Diagnosis not present

## 2020-08-26 LAB — CUP PACEART REMOTE DEVICE CHECK
Battery Remaining Longevity: 10 mo
Battery Voltage: 2.82 V
Brady Statistic AP VP Percent: 48.57 %
Brady Statistic AP VS Percent: 0.68 %
Brady Statistic AS VP Percent: 49.83 %
Brady Statistic AS VS Percent: 0.92 %
Brady Statistic RA Percent Paced: 48.78 %
Brady Statistic RV Percent Paced: 96.78 %
Date Time Interrogation Session: 20220608022704
HighPow Impedance: 75 Ohm
Implantable Lead Implant Date: 20160217
Implantable Lead Implant Date: 20160217
Implantable Lead Implant Date: 20160217
Implantable Lead Location: 753858
Implantable Lead Location: 753859
Implantable Lead Location: 753860
Implantable Lead Model: 4298
Implantable Lead Model: 5076
Implantable Pulse Generator Implant Date: 20160217
Lead Channel Impedance Value: 399 Ohm
Lead Channel Impedance Value: 418 Ohm
Lead Channel Impedance Value: 456 Ohm
Lead Channel Impedance Value: 456 Ohm
Lead Channel Impedance Value: 513 Ohm
Lead Channel Impedance Value: 532 Ohm
Lead Channel Impedance Value: 532 Ohm
Lead Channel Impedance Value: 608 Ohm
Lead Channel Impedance Value: 779 Ohm
Lead Channel Impedance Value: 836 Ohm
Lead Channel Impedance Value: 893 Ohm
Lead Channel Impedance Value: 931 Ohm
Lead Channel Impedance Value: 988 Ohm
Lead Channel Pacing Threshold Amplitude: 0.5 V
Lead Channel Pacing Threshold Amplitude: 1 V
Lead Channel Pacing Threshold Amplitude: 1.25 V
Lead Channel Pacing Threshold Pulse Width: 0.4 ms
Lead Channel Pacing Threshold Pulse Width: 0.4 ms
Lead Channel Pacing Threshold Pulse Width: 0.4 ms
Lead Channel Sensing Intrinsic Amplitude: 2.25 mV
Lead Channel Sensing Intrinsic Amplitude: 2.25 mV
Lead Channel Sensing Intrinsic Amplitude: 21.625 mV
Lead Channel Sensing Intrinsic Amplitude: 21.625 mV
Lead Channel Setting Pacing Amplitude: 2 V
Lead Channel Setting Pacing Amplitude: 2.25 V
Lead Channel Setting Pacing Amplitude: 2.5 V
Lead Channel Setting Pacing Pulse Width: 0.4 ms
Lead Channel Setting Pacing Pulse Width: 0.4 ms
Lead Channel Setting Sensing Sensitivity: 0.45 mV

## 2020-09-09 ENCOUNTER — Other Ambulatory Visit: Payer: Self-pay | Admitting: Internal Medicine

## 2020-09-18 ENCOUNTER — Other Ambulatory Visit: Payer: Self-pay | Admitting: Internal Medicine

## 2020-09-18 NOTE — Progress Notes (Signed)
Remote ICD transmission.   

## 2020-09-23 DIAGNOSIS — C674 Malignant neoplasm of posterior wall of bladder: Secondary | ICD-10-CM | POA: Diagnosis not present

## 2020-10-01 ENCOUNTER — Other Ambulatory Visit: Payer: Self-pay | Admitting: Urology

## 2020-10-02 ENCOUNTER — Other Ambulatory Visit: Payer: Self-pay

## 2020-10-02 ENCOUNTER — Other Ambulatory Visit: Payer: Self-pay | Admitting: Urology

## 2020-10-02 ENCOUNTER — Encounter (HOSPITAL_COMMUNITY): Payer: Self-pay | Admitting: Urology

## 2020-10-02 ENCOUNTER — Encounter: Payer: Self-pay | Admitting: Internal Medicine

## 2020-10-02 NOTE — Progress Notes (Signed)
Anesthesia Chart Review   Case: [id number] Date/Time: 10/06/20 1315   Procedure: TRANSURETHRAL RESECTION OF BLADDER TUMOR (TURBT)/ CYSTOSCOPY/ POST OPERATIVE INSTILLATION OF GEMCITABINE/ BLADDER BIOPSY   Anesthesia type: General   Pre-op diagnosis: BLADDER CANCER   Location: [place] [facility]   Surgeons: [name], MD       DISCUSSION:80 y.o. every day smoker with h/o HTN, GERD, LBBB, NICM, AICD in place (device orders in progress note 10/02/2020), COPD, spinal cord stim in place, chronic pain syndrome seen by pain management, bladder cancer scheduled for above procedure 10/06/2020 with Dr. [name].   Pt last seen by cardiology 11/26/2019. Per OV note pt stable at this visit.    S/p TURBT 12/13/2019 at surgery center with no anesthesia complications noted.   Pt not seen in PAT clinic, SDW.  Anticipate pt can proceed with planned procedure barring acute status change and after evaluation by anesthesia.  VS: Ht 5\' 8"  (1.727 m)   Wt 54.4 kg   BMI 18.25 kg/m   PROVIDERS: [name], MD is PCP   Kathlyn Sacramento, MD is Cardiologist  LABS:  labs DOS (all labs ordered are listed, but only abnormal results are displayed)  Labs Reviewed - No data to display   IMAGES:   EKG: 11/26/2019 Rate 73 bpm   CV: Echo 08/31/2018  1. The left ventricle has normal systolic function with an ejection  fraction of 60-65%. The cavity size was normal. Left ventricular diastolic  Doppler parameters are consistent with impaired relaxation.   2. The right ventricle has normal systolic function. The cavity was  normal. There is no increase in right ventricular wall thickness.Normal  RVSP   3. PVCs noted   Cardiac Cath 04/09/2014 Proximal LAD: Lesion 1: 20% stenosis Proximal RCA: Lesion 1: tubular, 20% stenosis Distal RCA: Lesion 1: discrete, 50% stenosis RPLS: Lesion 1: 50% stenosis Past Medical History:  Diagnosis Date   AICD (automatic cardioverter/defibrillator)  present EP cardiologist--- dr Caryl Comes    placement 05-07-2014 , ef 25%,  NICM/  (02-11-2019 last echo 08-31-2018 ef 60-65%)   Arthritis    "in about all my joints; for sure in my back"   Bladder cancer [facility]) urologist-  dr Junious Silk   dx 1995--  recurrent bladder cancer 2015 , s/p TURBT's and chemo instillation's ;   04/ 2019  s/p TURBT   Chronic hyponatremia    Chronic low back pain with bilateral sciatica    s/p  spinal cord stimulator @ [facility]  [phone]   Chronic systolic (congestive) heart failure [facility])    cardiologist-  dr Rogue Jury   COPD with emphysema ([place])    (02-11-2019  per pt has never been on oxygen)   Coronary artery disease cardiologist-  dr Kathlyn Sacramento   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% per cardiac cath 03-03-2014   DDD (degenerative disc disease), thoracolumbar    Frequent urination    Full dentures    Gait instability    due to chronic low back pain, uses roller walker   GERD (gastroesophageal reflux disease)    History of iron deficiency anemia 11/2013   resolved w/ IV Iron  (02-11-2019  per pt has not had any issues since 2015)   History of stomach ulcers 11/2013   Hypertension    LBBB (left bundle branch block)    NICM (nonischemic cardiomyopathy) ([place]) last echo 12-31-2015 ef 55-60%   dx 09/ 2015 per echo 20%;  myoview 09/ 2015 ef 28%;  per cardiac  cath 12/ 2015 ef 30%;     Nocturia more than twice per night    S/P insertion of spinal cord stimulator followed by [facility]---- Dr Pecola Leisure (notes in care everywhere)   10-09-2018  @[facility] --- thoracic spinal cord stimular/ generator  (device from [employer])---- per pt has a control   Scoliosis     Past Surgical History:  Procedure Laterality Date   BI-VENTRICULAR IMPLANTABLE CARDIOVERTER DEFIBRILLATOR N/A 05/07/2014   Procedure: BI-VENTRICULAR IMPLANTABLE CARDIOVERTER DEFIBRILLATOR  (CRT-D);  Surgeon: Deboraha Sprang, MD;  Location: [facility] CATH LAB;  Service: Cardiovascular;  Laterality: N/A;   CARDIAC CATHETERIZATION   03-03-2014  dr Kathlyn Sacramento   [facility]   pLAD 20%, pRCA 20%, dRCA 50%, RPLS 50%;  ef 30%, mild elevated LVEDP, mild gradient across aortic valve LVOT   CARDIOVASCULAR STRESS TEST  11/25/2013   High risk nuclear study w/ large high severity inferior wall perfusion defect on stress and rest images, large mild severity anteroseptal wall perfusion defect on stress and rest images, No inducible ischemia/ global moderate hypokinesis, ef 28%   CATARACT EXTRACTION W/ INTRAOCULAR LENS  IMPLANT, BILATERAL Bilateral right 12-2013 / left  02-2014   CYSTOSCOPY W/ RETROGRADES Bilateral 01/06/2015   Procedure: CYSTOSCOPY WITH  BLADDER BIOPSY BILATERAL RETROGRADE PYELOGRAM,INSTILLATION OF MITOMYCIN C;  Surgeon: [name], MD;  Location: [facility];  Service: Urology;  Laterality: Bilateral;   CYSTOSCOPY WITH BIOPSY N/A 10/06/2015   Procedure: CYSTO WITH BLADDER BIOPSY, FULGERATION, CHEMO IRRIGATION EPIRUBICIN IN PACU;  Surgeon: [name], MD;  Location: [facility];  Service: Urology;  Laterality: N/A;   CYSTOSCOPY WITH BIOPSY N/A 02/12/2019   Procedure: CYSTOSCOPY WITH BIOPSY/ FULGURATION/ INSTILLATION OF GEMCITABINE, bilateral retrograde turbt greater 5cm;  Surgeon: [name], MD;  Location: [facility];  Service: Urology;  Laterality: N/A;   CYSTOSCOPY WITH FULGERATION N/A 06/30/2017   Procedure: Marland Kitchen CYSTOSCOPY WITH [place] FULGERATION/ BLADDER BIOPSY/ INSTILLATION OF EPIRUBICIN;  Surgeon: [name], MD;  Location: [facility];  Service: Urology;  Laterality: N/A;   CYSTOSCOPY WITH RETROGRADE PYELOGRAM, URETEROSCOPY AND STENT PLACEMENT Bilateral 04/18/2014   Procedure: CYSTOSCOPY WITH RETROGRADE PYELOGRAM;  Surgeon: [name], MD;  Location: [facility];  Service: Urology;  Laterality: Bilateral;   ESOPHAGOGASTRODUODENOSCOPY N/A 11/28/2013   Procedure: ESOPHAGOGASTRODUODENOSCOPY (EGD);  Surgeon: Arta Silence, MD;  Location: [facility] ENDOSCOPY;  Service: Endoscopy;   Laterality: N/A;   LUMBAR DISC SURGERY  1980's   "ruptured disc"   SPINAL CORD STIMULATOR IMPLANT  10-09-2018   @[facility]    Thoracic spinal cord stimular/ genertor  (left flank)----- (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[employer])   TRANSTHORACIC ECHOCARDIOGRAM  12/31/2015   dr Caryl Comes   ef 93-716, grade 1 diastolic dysfunction/ mild MR/ septal motion showed abnormal function and dyssynergy   TRANSURETHRAL RESECTION OF BLADDER  1995   TRANSURETHRAL RESECTION OF BLADDER TUMOR N/A 12/13/2013   Procedure: TRANSURETHRAL RESECTION OF BLADDER TUMOR (TURBT);  Surgeon: [name], MD;  Location: [facility];  Service: Urology;  Laterality: N/A;   TRANSURETHRAL RESECTION OF BLADDER TUMOR N/A 01/17/2014   Procedure: TRANSURETHRAL RESECTION OF BLADDER TUMOR (TURBT);  Surgeon: [name], MD;  Location: [facility];  Service: Urology;  Laterality: N/A;   TRANSURETHRAL RESECTION OF BLADDER TUMOR N/A 04/18/2014   Procedure: TRANSURETHRAL RESECTION OF BLADDER TUMOR (TURBT), CYSTOGRAM;  Surgeon: [name], MD;  Location: [facility];  Service: Urology;  Laterality: N/A;   TRANSURETHRAL RESECTION OF BLADDER TUMOR N/A 12/13/2019   Procedure: TRANSURETHRAL RESECTION OF BLADDER TUMOR (TURBT) GREATER THAN  5CM WITH CYSTOSCOPY/ POST OPERATIVE INSTILLATION OF GEMCITABINE;  Surgeon: [name], MD;  [facility];  Service: Urology;  Laterality: N/A;    MEDICATIONS: No current facility-administered medications for this encounter.    acetaminophen (TYLENOL) 500 MG tablet   amitriptyline (ELAVIL) 10 MG tablet   amLODipine (NORVASC) 2.5 MG tablet   gabapentin (NEURONTIN) 300 MG capsule   losartan (COZAAR) 100 MG tablet   Menthol, Topical Analgesic, (BIOFREEZE EX)   metoprolol succinate (TOPROL-XL) 25 MG 24 hr tablet   oxyCODONE-acetaminophen (PERCOCET) 10-325 MG tablet   [START ON 10/10/2020] oxyCODONE-acetaminophen (PERCOCET) 10-325 MG tablet   polyethylene glycol (MIRALAX / GLYCOLAX) packet   promethazine  (PHENERGAN) 12.5 MG tablet   rosuvastatin (CRESTOR) 5 MG tablet   sucralfate (CARAFATE) 1 g tablet    epirubicin (ELLENCE) 50 mg in sodium chloride 0.9 % bladder instillation   gemcitabine (GEMZAR) chemo syringe for bladder instillation 2,000 mg    Konrad Felix, PA-C [facility] [phone]

## 2020-10-02 NOTE — Progress Notes (Signed)
[place] DEVICE PROGRAMMING  Patient Information: Name:  [name]  DOB:  10-31-1940  MRN:  [id number]    Blenda Peals, RN  P [facility] Device Planned Procedure:  TURBT  Surgeon: [name]  Date of Procedure: 10-06-20  Cautery will be used. yes  Position during surgery:  Supine   Please send documentation back to:  [facility] (Fax # [phone])   Lambert Keto, RN  10/02/2020 9:23 AM  Device Information:  Clinic EP Physician:  Virl Axe, MD   Device Type:  Defibrillator Manufacturer and Phone #:  [employer]onic: [phone] Pacemaker Dependent?:  Unknown- Patient has not presented for in-clinic testing in >3years Date of Last Device Check:  08/26/20 Remote Normal Device Function?:  Yes.    Electrophysiologist's Recommendations:  Have magnet available. Provide continuous ECG monitoring when magnet is used or reprogramming is to be performed.  Procedure may interfere with device function.  Magnet should be placed over device during procedure. Please have industry present to test device prior to procedure since patient has not had device tested in a long time.    Per Device Clinic Standing Orders, York Ram, RN  1:30 PM 10/02/2020

## 2020-10-02 NOTE — Progress Notes (Addendum)
COVID Vaccine Completed: Yes x2 Date COVID Vaccine completed: 10-21-19 11-18-19 Has received booster:  No COVID vaccine manufacturer:  Moderna    Date of COVID positive in last 90 days:  N/A  PCP - Deborra Medina, MD Cardiologist - Kathlyn Sacramento, MD and Jolyn Nap, MD  Chest x-ray - N/A EKG - 11-26-19 Epic Stress Test - 2015 Epic ECHO - 08-31-18 Epic Cardiac Cath - 2016 Epic Pacemaker/ICD device last checked: 08-26-20 Epic.  Device orders in Epic Spinal Cord Stimulator: Yes, low left back  Sleep Study - N/A CPAP -   Fasting Blood Sugar - N/A Checks Blood Sugar _____ times a day  Blood Thinner Instructions:  N/A Aspirin Instructions: Last Dose:  Activity level:   Unable to go up stairs due to balance issues, uses a walker to ambulate.  Able to perform activities of daily living without stopping and without symptoms of chest pain or shortness of breath.  Patient lives with her daughter and has a caregiver during the day.      Anesthesia review:  LBBB, atrial ectopy, cardiomyopathy, CHF, CAD, COPD HTN.  AICD and spinal cord stimulator place  Patient denies shortness of breath, fever, cough and chest pain at PAT appointment (completed over the phone)   Patient verbalized understanding of instructions that were given to them at the PAT appointment. Patient was also instructed that they will need to review over the PAT instructions again at home before surgery.

## 2020-10-05 MED ORDER — GEMCITABINE CHEMO FOR BLADDER INSTILLATION 2000 MG: 2000.0000 mg | Freq: Once | INTRAVENOUS | Status: AC

## 2020-10-06 ENCOUNTER — Ambulatory Visit (HOSPITAL_COMMUNITY)
Admission: RE | Admit: 2020-10-06 | Discharge: 2020-10-06 | Disposition: A | Discharge status: Home / Self Care | Payer: Medicare Other | Attending: Urology | Admitting: Urology

## 2020-10-06 ENCOUNTER — Ambulatory Visit (HOSPITAL_COMMUNITY): Payer: Medicare Other | Admitting: Physician Assistant

## 2020-10-06 ENCOUNTER — Encounter (HOSPITAL_COMMUNITY): Payer: Self-pay | Admitting: Urology

## 2020-10-06 ENCOUNTER — Encounter (HOSPITAL_COMMUNITY)
Admission: RE | Disposition: A | Discharge status: Home / Self Care | Payer: Self-pay | Source: Home / Self Care | Attending: Urology

## 2020-10-06 DIAGNOSIS — K219 Gastro-esophageal reflux disease without esophagitis: Secondary | ICD-10-CM | POA: Diagnosis not present

## 2020-10-06 DIAGNOSIS — G894 Chronic pain syndrome: Secondary | ICD-10-CM | POA: Insufficient documentation

## 2020-10-06 DIAGNOSIS — I110 Hypertensive heart disease with heart failure: Secondary | ICD-10-CM | POA: Diagnosis not present

## 2020-10-06 DIAGNOSIS — J439 Emphysema, unspecified: Secondary | ICD-10-CM | POA: Diagnosis not present

## 2020-10-06 DIAGNOSIS — Z87440 Personal history of urinary (tract) infections: Secondary | ICD-10-CM | POA: Diagnosis not present

## 2020-10-06 DIAGNOSIS — C679 Malignant neoplasm of bladder, unspecified: Secondary | ICD-10-CM | POA: Diagnosis not present

## 2020-10-06 DIAGNOSIS — Z79899 Other long term (current) drug therapy: Secondary | ICD-10-CM | POA: Insufficient documentation

## 2020-10-06 DIAGNOSIS — F17200 Nicotine dependence, unspecified, uncomplicated: Secondary | ICD-10-CM | POA: Insufficient documentation

## 2020-10-06 DIAGNOSIS — Z95810 Presence of automatic (implantable) cardiac defibrillator: Secondary | ICD-10-CM | POA: Insufficient documentation

## 2020-10-06 DIAGNOSIS — I428 Other cardiomyopathies: Secondary | ICD-10-CM | POA: Insufficient documentation

## 2020-10-06 DIAGNOSIS — C674 Malignant neoplasm of posterior wall of bladder: Secondary | ICD-10-CM | POA: Diagnosis not present

## 2020-10-06 DIAGNOSIS — I447 Left bundle-branch block, unspecified: Secondary | ICD-10-CM | POA: Diagnosis not present

## 2020-10-06 DIAGNOSIS — I5022 Chronic systolic (congestive) heart failure: Secondary | ICD-10-CM | POA: Insufficient documentation

## 2020-10-06 DIAGNOSIS — D090 Carcinoma in situ of bladder: Secondary | ICD-10-CM | POA: Diagnosis not present

## 2020-10-06 DIAGNOSIS — Z8551 Personal history of malignant neoplasm of bladder: Secondary | ICD-10-CM | POA: Diagnosis not present

## 2020-10-06 DIAGNOSIS — I2510 Atherosclerotic heart disease of native coronary artery without angina pectoris: Secondary | ICD-10-CM | POA: Diagnosis not present

## 2020-10-06 HISTORY — PX: TRANSURETHRAL RESECTION OF BLADDER TUMOR: SHX2575

## 2020-10-06 LAB — CBC
HCT: 42.2 % (ref 36.0–46.0)
Hemoglobin: 13.7 g/dL (ref 12.0–15.0)
MCH: 31.1 pg (ref 26.0–34.0)
MCHC: 32.5 g/dL (ref 30.0–36.0)
MCV: 95.7 fL (ref 80.0–100.0)
Platelets: 141 10*3/uL — ABNORMAL LOW (ref 150–400)
RBC: 4.41 MIL/uL (ref 3.87–5.11)
RDW: 13.7 % (ref 11.5–15.5)
WBC: 6.4 10*3/uL (ref 4.0–10.5)
nRBC: 0 % (ref 0.0–0.2)

## 2020-10-06 LAB — BASIC METABOLIC PANEL
Anion gap: 7 (ref 5–15)
BUN: 12 mg/dL (ref 8–23)
CO2: 29 mmol/L (ref 22–32)
Calcium: 9.8 mg/dL (ref 8.9–10.3)
Chloride: 102 mmol/L (ref 98–111)
Creatinine, Ser: 0.52 mg/dL (ref 0.44–1.00)
GFR, Estimated: >60 mL/min (ref >60)
Glucose, Bld: 103 mg/dL — ABNORMAL HIGH (ref 70–99)
Potassium: 4.3 mmol/L (ref 3.5–5.1)
Sodium: 138 mmol/L (ref 135–145)

## 2020-10-06 SURGERY — TURBT (TRANSURETHRAL RESECTION OF BLADDER TUMOR)
Anesthesia: General

## 2020-10-06 MED ORDER — LIDOCAINE 2% (20 MG/ML) 5 ML SYRINGE
INTRAMUSCULAR | Status: AC
  Filled 2020-10-06: qty 5

## 2020-10-06 MED ORDER — PROPOFOL 10 MG/ML IV BOLUS
INTRAVENOUS | Status: AC
  Filled 2020-10-06: qty 20

## 2020-10-06 MED ORDER — ONDANSETRON HCL 4 MG/2ML IJ SOLN
INTRAMUSCULAR | Status: AC
  Filled 2020-10-06: qty 2

## 2020-10-06 MED ORDER — GEMCITABINE CHEMO FOR BLADDER INSTILLATION 2000 MG
2000.0000 mg | Freq: Once | INTRAVENOUS | Status: AC
  Administered 2020-10-06: 2000 mg via INTRAVESICAL
  Filled 2020-10-06: qty 52.6

## 2020-10-06 MED ORDER — DEXAMETHASONE SODIUM PHOSPHATE 10 MG/ML IJ SOLN
INTRAMUSCULAR | Status: AC
  Filled 2020-10-06: qty 1

## 2020-10-06 MED ORDER — LIDOCAINE 2% (20 MG/ML) 5 ML SYRINGE
INTRAMUSCULAR | Status: DC | PRN
  Administered 2020-10-06: 25 mg via INTRAVENOUS
  Administered 2020-10-06: 100 mg via INTRAVENOUS

## 2020-10-06 MED ORDER — CHLORHEXIDINE GLUCONATE 0.12 % MT SOLN
15.0000 mL | Freq: Once | OROMUCOSAL | Status: AC
  Administered 2020-10-06: 15 mL via OROMUCOSAL

## 2020-10-06 MED ORDER — SODIUM CHLORIDE 0.9 % IR SOLN
Status: DC | PRN
  Administered 2020-10-06: 9000 mL via INTRAVESICAL

## 2020-10-06 MED ORDER — SODIUM CHLORIDE 0.9 % IV SOLN: 2.0000 g | Freq: Once | INTRAVENOUS | Status: DC

## 2020-10-06 MED ORDER — PROMETHAZINE HCL 25 MG/ML IJ SOLN
6.2500 mg | INTRAMUSCULAR | Status: DC | PRN
Start: 2020-10-06 — End: 2020-10-06

## 2020-10-06 MED ORDER — AMISULPRIDE (ANTIEMETIC) 5 MG/2ML IV SOLN: 10.0000 mg | Freq: Once | INTRAVENOUS | Status: DC | PRN

## 2020-10-06 MED ORDER — ONDANSETRON HCL 4 MG/2ML IJ SOLN
INTRAMUSCULAR | Status: DC | PRN
  Administered 2020-10-06: 4 mg via INTRAVENOUS

## 2020-10-06 MED ORDER — PROPOFOL 10 MG/ML IV BOLUS
INTRAVENOUS | Status: DC | PRN
  Administered 2020-10-06: 100 mg via INTRAVENOUS

## 2020-10-06 MED ORDER — PHENYLEPHRINE 40 MCG/ML (10ML) SYRINGE FOR IV PUSH (FOR BLOOD PRESSURE SUPPORT)
PREFILLED_SYRINGE | INTRAVENOUS | Status: AC
  Filled 2020-10-06: qty 10

## 2020-10-06 MED ORDER — FENTANYL CITRATE (PF) 100 MCG/2ML IJ SOLN
INTRAMUSCULAR | Status: AC
  Filled 2020-10-06: qty 2

## 2020-10-06 MED ORDER — FENTANYL CITRATE (PF) 100 MCG/2ML IJ SOLN: 25.0000 ug | INTRAMUSCULAR | Status: DC | PRN

## 2020-10-06 MED ORDER — DEXAMETHASONE SODIUM PHOSPHATE 10 MG/ML IJ SOLN
INTRAMUSCULAR | Status: DC | PRN
  Administered 2020-10-06: 6 mg via INTRAVENOUS

## 2020-10-06 MED ORDER — PHENYLEPHRINE 40 MCG/ML (10ML) SYRINGE FOR IV PUSH (FOR BLOOD PRESSURE SUPPORT)
PREFILLED_SYRINGE | INTRAVENOUS | Status: DC | PRN
  Administered 2020-10-06 (×3): 80 ug via INTRAVENOUS
  Administered 2020-10-06: 40 ug via INTRAVENOUS
  Administered 2020-10-06: 80 ug via INTRAVENOUS

## 2020-10-06 MED ORDER — SODIUM CHLORIDE 0.9 % IV SOLN
2.0000 g | Freq: Once | INTRAVENOUS | Status: AC
  Administered 2020-10-06: 2 g via INTRAVENOUS
  Filled 2020-10-06: qty 2

## 2020-10-06 MED ORDER — CELECOXIB 200 MG PO CAPS
200.0000 mg | ORAL_CAPSULE | Freq: Once | ORAL | Status: AC
  Administered 2020-10-06: 200 mg via ORAL
  Filled 2020-10-06: qty 1

## 2020-10-06 MED ORDER — ORAL CARE MOUTH RINSE: 15.0000 mL | Freq: Once | OROMUCOSAL | Status: AC

## 2020-10-06 MED ORDER — ACETAMINOPHEN 500 MG PO TABS
1000.0000 mg | ORAL_TABLET | Freq: Once | ORAL | Status: AC
  Administered 2020-10-06: 1000 mg via ORAL
  Filled 2020-10-06: qty 2

## 2020-10-06 MED ORDER — LACTATED RINGERS IV SOLN: INTRAVENOUS | Status: DC

## 2020-10-06 MED ORDER — FENTANYL CITRATE (PF) 100 MCG/2ML IJ SOLN
INTRAMUSCULAR | Status: DC | PRN
  Administered 2020-10-06: 25 ug via INTRAVENOUS

## 2020-10-06 SURGICAL SUPPLY — 15 items
BAG DRN RND TRDRP ANRFLXCHMBR (UROLOGICAL SUPPLIES) ×1
BAG URINE DRAIN 2000ML AR STRL (UROLOGICAL SUPPLIES) ×1 IMPLANT
BAG URO CATCHER STRL LF (MISCELLANEOUS) ×2 IMPLANT
CATH FOLEY 2WAY SLVR  5CC 16FR (CATHETERS) ×2
CATH FOLEY 2WAY SLVR 5CC 16FR (CATHETERS) IMPLANT
DRAPE FOOT SWITCH (DRAPES) ×2 IMPLANT
GLOVE SURG ENC TEXT LTX SZ7.5 (GLOVE) ×2 IMPLANT
GOWN STRL REUS W/TWL XL LVL3 (GOWN DISPOSABLE) ×2 IMPLANT
KIT TURNOVER KIT A (KITS) ×2 IMPLANT
LOOP CUT BIPOLAR 24F LRG (ELECTROSURGICAL) ×1 IMPLANT
MANIFOLD NEPTUNE II (INSTRUMENTS) ×2 IMPLANT
PACK CYSTO (CUSTOM PROCEDURE TRAY) ×2 IMPLANT
PENCIL SMOKE EVACUATOR (MISCELLANEOUS) IMPLANT
TUBING CONNECTING 10 (TUBING) ×2 IMPLANT
TUBING UROLOGY SET (TUBING) ×2 IMPLANT

## 2020-10-06 NOTE — Interval H&P Note (Signed)
History and Physical Interval Note:  10/06/2020 1:03 PM  [name]  has presented today for surgery, with the diagnosis of BLADDER CANCER.  The various methods of treatment have been discussed with the patient and family. After consideration of risks, benefits and other options for treatment, the patient has consented to  Procedure(s): TRANSURETHRAL RESECTION OF BLADDER TUMOR (TURBT)/ CYSTOSCOPY/ POST OPERATIVE INSTILLATION OF GEMCITABINE/ BLADDER BIOPSY (N/A) as a surgical intervention.  The patient's history has been reviewed, patient examined, no change in status, stable for surgery.  I have reviewed the patient's chart and labs.  Questions were answered to the patient's satisfaction.  She is doing well without dysuria, hematuria or fever.   [name]

## 2020-10-06 NOTE — H&P (Signed)
Office Visit Report     09/23/2020   --------------------------------------------------------------------------------   [name]  MRN: [id number]  DOB: Sep 01, 1940, 80 year old Female  SSN: -[id number]   PRIMARY CARE:  Robert L. Maceo Pro, MD  REFERRING:  Georgette Dover, MD  PROVIDER:  [name], M.D.  LOCATION:  [facility] [phone]     --------------------------------------------------------------------------------   CC/HPI: F/u -   1) bladder ca - History of bladder cancer in 1995 when she saw Dr. Leory Plowman. Recurrence noted in 2015.   TURs:  -Sept 2015, Oct 2015 and Jan 2016 - HG Ta disease - muscle present and negative.  -Oct 2016 bbx/turbt - multifocal LG Ta ([facility] in pacu);  -Jul 2017 Cysto, bbx - LG Ta - [facility] in PACU  -Apr 2019 small HG and LG Ta recurrence + epirubicin in PACU  -Dec 2019 - fulguration of small posterior recurrence  -Nov 2020 mf HG Ta  -Jun 2021 cysto erythema, cytology negative  -Sep 2021 LG Ta   -Last upper tract - 11/20 RGPs, 07/18 CT   Intravesical tx: Jan 2021 two cycles Gemzar with Dr. Grayland Ormond (Covid, bacteriuria and LUTS complicated care)  BCG maintenance Jan 2020, reintroduced BCG Jan 2017 x 6   She returns in management of the above. No gross hematuria. Cysto 12/21 looked clear. Pseudomonas UTI Jan 2022. She is well. No dysuria or gross hematuria.     ALLERGIES: No Allergies    MEDICATIONS: Metoprolol Succinate 25 mg tablet, extended release 24 hr  Percocet 7.5 mg-325 mg tablet 1 tablet PO Q 8 H PRN  Amlodipine Besylate 2.5 mg tablet  Carafate  Elavil  Gabapentin  Losartan Potassium 100 mg tablet  Miralax 17 gram/dose powder 0 Oral  Pantoprazole Sodium  Phenergan     GU PSH: Bladder Instill AntiCA Agent - 12/13/2019, 02/12/2019, 2020, 2020, 2019, 2019, 2019, 2019, 2019, 2018, 2018, 2018, 2018, 2018, 2018, 2017, 2017, 2017, 2017, 2016, 2016 Cystoscopy - 06/15/2020, 03/17/2020, 11/27/2019, 08/20/2019,  05/15/2019, 01/18/2019, 2020, 2020, 2019, 2019, 2019, 2018, 2018, 2017, 2017 Cystoscopy Fulguration - 2019 Cystoscopy TURBT >5 cm - 12/13/2019, 2016, 2015, 2015 Cystoscopy TURBT 2-5 cm - 02/12/2019, 2019, 2017, 2016 Locm 300-399Mg /Ml Iodine,1Ml - 2018       PSH Notes: Bladder Injection Of Cancer Treatment, Cystoscopy With Fulguration Medium Lesion (2-5cm), Cystoscopy With Fulguration Large Lesion (Over 5cm), Bladder Injection Of Cancer Treatment, Cystoscopy With Fulguration Large Lesion (Over 5cm), Bladder Surgery, Back Surgery, Cystoscopy With Fulguration Large Lesion (Over 5cm)   NON-GU PSH: None   GU PMH: Acute Cystitis/UTI, She is well enough for cysto and since cathed urine is coming for UTI dx we went ahead took a look p she took a cipro. - 06/15/2020, - 04/03/2020 History of bladder cancer, Cysto today without papillary tumor which is great. - 06/15/2020, (Stable), bladder looked good today. Check in 3 mo , - 03/17/2020 (Stable), - 2020, History of bladder cancer, - 2015 Dysuria - 04/03/2020, (Stable), - 05/28/2019, - 2018 Bladder Cancer overlapping sites, Disc nature r/b/a to TURBT and gemcitabine. She will proceed . - 11/27/2019, - 01/18/2019, Malignant neoplasm of overlapping sites of bladder, - 2017 Incomplete bladder emptying - 05/28/2019 Urinary Frequency (Stable), increase solifenacin to 10 mg - 05/15/2019, - 2020 Bladder Cancer Lateral - 2019, - 2019, - 2019, - 2018, - 2018, - 2017, - 2017, - 2017, Malignant neoplasm of lateral wall of urinary bladder, - 2016 Bladder Cancer Dome, Malignant neoplasm of dome of bladder - 2016  Bladder Cancer Posterior, Malignant neoplasm of posterior wall of urinary bladder - 2015 Gross hematuria, Gross hematuria - 2015 Urethral Cancer, Malignant neoplasm of bladder neck - 2015    NON-GU PMH: Encounter for general adult medical examination without abnormal findings, Encounter for preventive health examination - 2017 Bacteriuria, Bacteriuria, asymptomatic -  2016 Other constipation, Chronic constipation - 2015 Personal history of other diseases of the circulatory system, History of cardiac arrhythmia - 2015, History of cardiac disorder, - 2015, History of hypertension, - 2015 Personal history of other diseases of the digestive system, History of gastric ulcer - 2015 Personal history of other diseases of the musculoskeletal system and connective tissue, History of degenerative disc disease - 2015    FAMILY HISTORY: Colon Cancer - Runs In Family Death - Runs In Family liver cancer - Runs In Family Lung Cancer - Runs In Family   SOCIAL HISTORY: Marital Status: Widowed Preferred Language: English; Ethnicity: Not Hispanic Or Latino; Race: White Current Smoking Status: Patient smokes.  Has never drank.  Does not drink caffeine.     Notes: Current every day smoker, Six children, Alcohol use, Widowed, Caffeine use   REVIEW OF SYSTEMS:    GU Review Female:   Patient reports frequent urination and get up at night to urinate. Patient denies hard to postpone urination, burning /pain with urination, leakage of urine, stream starts and stops, trouble starting your stream, have to strain to urinate, and being pregnant.  Gastrointestinal (Upper):   Patient denies nausea, vomiting, and indigestion/ heartburn.  Gastrointestinal (Lower):   Patient denies diarrhea and constipation.  Constitutional:   Patient denies fever, night sweats, weight loss, and fatigue.  Skin:   Patient denies skin rash/ lesion and itching.  Eyes:   Patient denies double vision and blurred vision.  Ears/ Nose/ Throat:   Patient denies sore throat and sinus problems.  Hematologic/Lymphatic:   Patient denies swollen glands and easy bruising.  Cardiovascular:   Patient denies leg swelling and chest pains.  Respiratory:   Patient denies cough and shortness of breath.  Endocrine:   Patient denies excessive thirst.  Musculoskeletal:   Patient denies back pain and joint pain.   Neurological:   Patient denies headaches and dizziness.  Psychologic:   Patient denies depression and anxiety.   VITAL SIGNS: None   MULTI-SYSTEM PHYSICAL EXAMINATION:    Constitutional: Well-nourished. No physical deformities. Normally developed. Good grooming.  Neck: Neck symmetrical, not swollen. Normal tracheal position.  Respiratory: No labored breathing, no use of accessory muscles.   Cardiovascular: Normal temperature, normal extremity pulses, no swelling, no varicosities.  Neurologic / Psychiatric: Oriented to time, oriented to place, oriented to person. No depression, no anxiety, no agitation.  Gastrointestinal: No mass, no tenderness, no rigidity, non obese abdomen.     PAST DATA REVIEW: None   PROCEDURES:         Flexible Cystoscopy - 52000  Risks, benefits, and some of the potential complications of the procedure were discussed at length with the patient including infection, bleeding, voiding discomfort, urinary retention, fever, chills, sepsis, and others. All questions were answered. Informed consent was obtained. Antibiotic prophylaxis was given. Sterile technique and intraurethral analgesia were used. Chaperone - lauren - for exam and cystoscopy.   Meatus:  Normal size. Normal location. Normal condition.  Urethra:  No hypermobility. No leakage.  Ureteral Orifices:  Normal location. Normal size. Normal shape. Effluxed clear urine.  Bladder:  A few posterior wall tumors. No trabeculation. Normal mucosa. No stones.  The lower urinary tract was carefully examined. The procedure was well-tolerated and without complications. Antibiotic instructions were given. Instructions were given to call the office immediately for bloody urine, difficulty urinating, urinary retention, painful or frequent urination, fever, chills, nausea, vomiting or other illness. The patient stated that she understood these instructions and would comply with them.         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 She will proceed.    PLAN:           Schedule Return Visit/Planned Activity: Next Available Appointment - Schedule Surgery          Document Letter(s):  Created for Patient: Clinical Summary         Notes:   cc: Dr. Maceo Pro     * Signed by [name], M.D. on 09/23/20 at 9:59 PM (EDT)*     The information contained in this medical record document is considered private and confidential patient information. This information can only be used for the medical diagnosis and/or medical services that are being provided by the patient's selected caregivers. This information can only be distributed outside of the patient's care if the patient agrees and signs waivers of authorization for this information to be sent to an outside source or route.

## 2020-10-06 NOTE — Anesthesia Preprocedure Evaluation (Addendum)
Anesthesia Evaluation  Patient identified by MRN, date of birth, ID band Patient awake    Reviewed: Allergy & Precautions, NPO status , Patient's Chart, lab work & pertinent test results, reviewed documented beta blocker date and time   History of Anesthesia Complications Negative for: history of anesthetic complications  Airway Mallampati: I  TM Distance: >3 FB Neck ROM: Full    Dental  (+) Edentulous Upper, Edentulous Lower, Dental Advisory Given   Pulmonary COPD, Current Smoker and Patient abstained from smoking.,    Pulmonary exam normal - rhonchi (-) decreased breath sounds      Cardiovascular hypertension, Pt. on medications and Pt. on home beta blockers + CAD, + Peripheral Vascular Disease and +CHF  Normal cardiovascular exam+ dysrhythmias + Cardiac Defibrillator   Echo 08/2018 1. The left ventricle has normal systolic function with an ejection fraction of 60-65%. The cavity size was normal. Left ventricular diastolic Doppler parameters are consistent with impaired relaxation.  2. The right ventricle has normal systolic function. The cavity was normal. There is no increase in right ventricular wall thickness.Normal RVSP  3. PVCs noted       Neuro/Psych S/p spinal cord stimulator  Neuromuscular disease    GI/Hepatic Neg liver ROS, GERD  ,  Endo/Other  negative endocrine ROS  Renal/GU Bladder CA     Musculoskeletal  (+) Arthritis ,   Abdominal   Peds  Hematology negative hematology ROS (+)   Anesthesia Other Findings   Reproductive/Obstetrics                            Anesthesia Physical  Anesthesia Plan  ASA: 3  Anesthesia Plan: General   Post-op Pain Management:    Induction: Intravenous  PONV Risk Score and Plan: 2 and Treatment may vary due to age or medical condition, Dexamethasone and Ondansetron  Airway Management Planned: LMA  Additional Equipment:    Intra-op Plan:   Post-operative Plan: Extubation in OR  Informed Consent: I have reviewed the patients History and Physical, chart, labs and discussed the procedure including the risks, benefits and alternatives for the proposed anesthesia with the patient or authorized representative who has indicated his/her understanding and acceptance.     Dental advisory given  Plan Discussed with: Anesthesiologist and CRNA  Anesthesia Plan Comments:        Anesthesia Quick Evaluation

## 2020-10-06 NOTE — Anesthesia Procedure Notes (Signed)
Procedure Name: LMA Insertion Date/Time: 10/06/2020 1:27 PM Performed by: Sharlette Dense, CRNA Patient Re-evaluated:Patient Re-evaluated prior to induction Oxygen Delivery Method: Circle system utilized Preoxygenation: Pre-oxygenation with 100% oxygen Induction Type: IV induction LMA: LMA inserted LMA Size: 4.0 Number of attempts: 1 Placement Confirmation: positive ETCO2 and breath sounds checked- equal and bilateral Tube secured with: Tape Dental Injury: Teeth and Oropharynx as per pre-operative assessment

## 2020-10-06 NOTE — Progress Notes (Signed)
Paged [employer] rep Renae Fickle at [facility] about patients Defibrillator. Order reads to have industry rep present to check device since patient has not been in the clinic in a long time.  However last remote check was June 8,2022 and device had normal function.  Rep Renae Fickle returned call around 1200 and states patients remote checks have been perfect, normal device functioning.  Dr.Singer was in the room when he called and he asked about using the magnet if needed during the procedure and the Rep stated yes to have magnet available.

## 2020-10-06 NOTE — Op Note (Signed)
Preoperative diagnosis: Bladder cancer Postoperative diagnosis: Bladder cancer  Procedure: Cystoscopy, TURBT 0.5 to 2 cm, postoperative instillation of gemcitabine in PACU  Surgeon: Junious Silk  Anesthesia: General  Indication for procedure: [name] is an 80 year old female with a recurrent bladder cancer.  She had some posterior wall tumors and was brought today for TURBT and gemcitabine.  Findings: On exam there were no palpable bladder masses.  On cystoscopy the urethra was unremarkable, trigone and ureteral orifice ease appeared normal.  No stone or foreign body in the bladder.  There was some scattered tumors mainly posterior but about 4 small tumors toward the dome.  All of these appeared superficial.  The 2 primary tumors in the posterior bladder were about 2 cm and required resection.  Description of procedure: After consent was obtained patient brought to the operating room.  She was placed lithotomy position and prepped and draped in the usual sterile fashion.  Timeout was performed confirm the patient and procedure.  Cystoscope passed per urethra and the bladder was carefully inspected with a 30 degree and 70 degree lens.  Because of the 2 posterior tumors I took a loop and doubt with the dome tumors.  These were carefully ablated and cauterized and appeared superficial and sort of wiped off the mucosal surface of the bladder.  The posterior tumors were then resected.  They did appear superficial.  They were collected for a specimen.  There were a few scattered small tumors around the larger posterior tumors.  These were also ablated and cauterized.  Hemostasis was excellent at low pressure.  No other tumors were noted.  The scope was removed and a 62 French Foley placed in left to gravity drainage.  She was awakened taken the cover him in stable condition.  Instillation of gemcitabine in PACU: Gemcitabine was instilled in the bladder per the Foley left for about 50 minutes and then  drained.  Complications: None  Blood loss: Minimal  Specimens to pathology: Posterior bladder tumor  Disposition: Patient stable to PACU

## 2020-10-06 NOTE — Anesthesia Postprocedure Evaluation (Signed)
Anesthesia Post Note  Patient: [name]  Procedure(s) Performed: TRANSURETHRAL RESECTION OF BLADDER TUMOR (TURBT)/ CYSTOSCOPY/  BLADDER BIOPSY     Patient location during evaluation: PACU Anesthesia Type: General Level of consciousness: sedated Pain management: pain level controlled Vital Signs Assessment: post-procedure vital signs reviewed and stable Respiratory status: spontaneous breathing and respiratory function stable Cardiovascular status: stable Postop Assessment: no apparent nausea or vomiting Anesthetic complications: no   No notable events documented.  Last Vitals:  Vitals:   10/06/20 1600 10/06/20 1620  BP: (!) 160/93 (!) 167/95  Pulse: 67   Resp: 11 14  Temp: (!) 36.4 C   SpO2: 92% 96%    Last Pain:  Vitals:   10/06/20 1620  TempSrc:   PainSc: 0-No pain                 Shantea Poulton DANIEL

## 2020-10-06 NOTE — Discharge Instructions (Signed)
Bladder Biopsy, Care After This sheet gives you information about how to care for yourself after your procedure. Your health care provider may also give you more specific instructions. If you have problems or questions, contact your health careprovider. What can I expect after the procedure? After the procedure, it is common to have: Mild pain in your bladder or kidney area during urination. Minor burning during urination. Small amounts of blood in your urine. A sudden urge to urinate. A need to urinate more often than usual. Follow these instructions at home: Medicines Take over-the-counter and prescription medicines only as told by your health care provider. If you were prescribed an antibiotic medicine, take it as told by your health care provider. Do not stop taking the antibiotic even if you start to feel better. Activity Rest if told by your health care provider. Do not drive for 24 hours if you received a medicine to help you relax (sedative) during your procedure. Ask your health care provider when it is safe for you to drive. Return to your normal activities as told by your health care provider. Ask your health care provider what activities are safe for you. General instructions  Take a warm bath to relieve any burning sensations around your urethra. Hold a warm, damp washcloth over the urethral area to ease pain. It is up to you to get the results of your procedure. Ask your health care provider, or the department that is doing the procedure, when your results will be ready. Keep all follow-up visits as told by your health care provider. This is important.   Contact a health care provider if: You have a fever. Your symptoms do not improve within 24 hours, and you continue to have: Burning during urination. Increasing amounts of blood in your urine. Pain during urination. An urgent need to urinate. A need to urinate more often than usual. Get help right away if: You have a  lot of bleeding or more bleeding. You have severe pain. You are unable to urinate. You have bright red blood in your urine. You are passing blood clots in your urine. You have a fever. Summary After the procedure, it is common to have mild pain, burning with urination, and some blood. Take medicines as told. If you were given antibiotics, finish all of it even if you start to feel better. Rest after the procedure. Follow your health care provider's instructions for self care at home. Contact a health care provider if your symptoms do not improve within 24 hours, or if you have more pain or more blood in your urine. Get help right away if you have a lot of bleeding, severe pain, fever, or bright red blood or blood clots in the urine. This information is not intended to replace advice given to you by your health care provider. Make sure you discuss any questions you have with your healthcare provider. Document Revised: 09/12/2018 Document Reviewed: 09/12/2018 Elsevier Patient Education  [place].

## 2020-10-06 NOTE — Transfer of Care (Signed)
Immediate Anesthesia Transfer of Care Note  Patient: [name]  Procedure(s) Performed: TRANSURETHRAL RESECTION OF BLADDER TUMOR (TURBT)/ CYSTOSCOPY/  BLADDER BIOPSY  Patient Location: PACU  Anesthesia Type:General  Level of Consciousness: awake  Airway & Oxygen Therapy: Patient Spontanous Breathing and Patient connected to face mask oxygen  Post-op Assessment: Report given to RN and Post -op Vital signs reviewed and stable  Post vital signs: Reviewed and stable  Last Vitals:  Vitals Value Taken Time  BP    Temp    Pulse 59 10/06/20 1419  Resp 12 10/06/20 1420  SpO2 100 % 10/06/20 1419  Vitals shown include unvalidated device data.  Last Pain:  Vitals:   10/06/20 1156  TempSrc:   PainSc: 0-No pain         Complications: No notable events documented.

## 2020-10-07 ENCOUNTER — Encounter (HOSPITAL_COMMUNITY): Payer: Self-pay | Admitting: Urology

## 2020-10-07 LAB — SURGICAL PATHOLOGY

## 2020-10-12 ENCOUNTER — Other Ambulatory Visit: Payer: Self-pay | Admitting: Internal Medicine

## 2020-10-12 DIAGNOSIS — I420 Dilated cardiomyopathy: Secondary | ICD-10-CM

## 2020-11-10 ENCOUNTER — Encounter: Payer: Self-pay | Admitting: Student in an Organized Health Care Education/Training Program

## 2020-11-10 ENCOUNTER — Other Ambulatory Visit: Payer: Self-pay

## 2020-11-10 ENCOUNTER — Ambulatory Visit
Payer: Medicare Other | Attending: Student in an Organized Health Care Education/Training Program | Admitting: Student in an Organized Health Care Education/Training Program

## 2020-11-10 VITALS — BP 138/96 | HR 77 | Temp 96.7°F | Resp 16 | Ht 67.0 in | Wt 120.0 lb

## 2020-11-10 DIAGNOSIS — M48062 Spinal stenosis, lumbar region with neurogenic claudication: Secondary | ICD-10-CM

## 2020-11-10 DIAGNOSIS — M47818 Spondylosis without myelopathy or radiculopathy, sacral and sacrococcygeal region: Secondary | ICD-10-CM | POA: Diagnosis not present

## 2020-11-10 DIAGNOSIS — M5416 Radiculopathy, lumbar region: Secondary | ICD-10-CM

## 2020-11-10 DIAGNOSIS — G894 Chronic pain syndrome: Secondary | ICD-10-CM | POA: Diagnosis not present

## 2020-11-10 DIAGNOSIS — Z9689 Presence of other specified functional implants: Secondary | ICD-10-CM

## 2020-11-10 DIAGNOSIS — M5136 Other intervertebral disc degeneration, lumbar region: Secondary | ICD-10-CM | POA: Diagnosis not present

## 2020-11-10 DIAGNOSIS — M47816 Spondylosis without myelopathy or radiculopathy, lumbar region: Secondary | ICD-10-CM | POA: Diagnosis not present

## 2020-11-10 MED ORDER — OXYCODONE-ACETAMINOPHEN 10-325 MG PO TABS: 1.0000 | ORAL_TABLET | ORAL | 0 refills | Status: AC | PRN

## 2020-11-10 MED ORDER — OXYCODONE-ACETAMINOPHEN 10-325 MG PO TABS: 1.0000 | ORAL_TABLET | ORAL | 0 refills | Status: DC | PRN

## 2020-11-10 NOTE — Progress Notes (Signed)
Nursing Pain Medication Assessment:  Safety precautions to be maintained throughout the outpatient stay will include: orient to surroundings, keep bed in low position, maintain call bell within reach at all times, provide assistance with transfer out of bed and ambulation.  Medication Inspection Compliance: Pill count conducted under aseptic conditions, in front of the patient. Neither the pills nor the bottle was removed from the patient's sight at any time. Once count was completed pills were immediately returned to the patient in their original bottle.  Medication: Oxycodone IR Pill/Patch Count:  0 of 150 pills remain Pill/Patch Appearance: No markings Bottle Appearance: Standard pharmacy container. Clearly labeled. Filled Date: 07 / 22 / 2022 Last Medication intake:  Today

## 2020-11-10 NOTE — Progress Notes (Addendum)
PROVIDER NOTE: Information contained herein reflects review and annotations entered in association with encounter. Interpretation of such information and data should be left to medically-trained personnel. Information provided to patient can be located elsewhere in the medical record under "Patient Instructions". Document created using STT-dictation technology, any transcriptional errors that may result from process are unintentional.    Patient: [name]  Service Category: E/M  Provider: [name], MD  DOB: 1940-06-01  DOS: 11/10/2020  Specialty: Interventional Pain Management  MRN: [id number]  Setting: Ambulatory outpatient  PCP: [name], MD  Type: Established Patient    Referring Provider: [name], MD  Location: Office  Delivery: Face-to-face     HPI  [name], a 80 y.o. year old female, is here today because of her Chronic pain syndrome [G89.4]. [name] primary complain today is Back Pain (Low and bilateral buttock pain) Last encounter: My last encounter with her was on 08/11/20 Pertinent problems: [name] has Spinal stenosis of thoracolumbar region; Spinal stenosis in cervical region; Spinal stenosis, lumbar region, with neurogenic claudication; Lumbar spondylosis; SI joint arthritis; Lumbar degenerative disc disease; Chronic pain syndrome; Lumbar radiculopathy; Lumbar facet arthropathy; and Back pain on their pertinent problem list. Pain Assessment: Severity of   is reported as a 7 /10. Location: Back Lower/buttocks. Onset: More than a month ago. Quality: Aching, Constant, Nagging. Timing: Constant. Modifying factor(s): medications, topicals. Vitals:  height is 5' 7"  (1.702 m) and weight is 120 lb (54.4 kg). Her temporal temperature is 96.7 F (35.9 C) (abnormal). Her blood pressure is 138/96 (abnormal) and her pulse is 77. Her respiration is 16 and oxygen saturation is 100%.   Reason for encounter: medication management.    No change in medical history since  last visit.  Patient's pain is at baseline.  Patient continues multimodal pain regimen as prescribed.  States that it provides SOME  pain relief and improvement in functional status. Is requesting increase in her Percocet which I do not recommend as patient is already on high dose.  We discussed the phenomena of central sensitization and opioid-induced hyperalgesia and I recommend the patient consider a drug holiday to decrease her tolerance.  I do not recommend dose escalation.  Continues with spinal cord stimulation.  I discussed Narcan prescription with the patient and recommend that she have that to be on the safe side however she adamantly declined.  Pharmacotherapy Assessment  Analgesic: Percocet 10 mg up to 5 times a day as needed for severe breakthrough pain, quantity 150/month; MME equals 75   Monitoring: [place] PMP: PDMP reviewed during this encounter.       Pharmacotherapy: No side-effects or adverse reactions reported. Compliance: No problems identified. Effectiveness: Clinically acceptable.  UDS:  Summary  Date Value Ref Range Status  08/11/2020 Note  Final    Comment:    ==================================================================== [employer] [facility] (MW) ==================================================================== Test                             Result       Flag       Units  Drug Present and Declared for Prescription Verification   Oxycodone                      4370         EXPECTED   ng/mg creat   Oxymorphone  8753         EXPECTED   ng/mg creat   Noroxycodone                   12500        EXPECTED   ng/mg creat   Noroxymorphone                 2957         EXPECTED   ng/mg creat    Sources of oxycodone are scheduled prescription medications.    Oxymorphone, noroxycodone, and noroxymorphone are expected    metabolites of oxycodone. Oxymorphone is also available as a    scheduled prescription  medication.  ==================================================================== Test                      Result    Flag   Units      Ref Range   Creatinine              30               mg/dL      >=20 ==================================================================== Declared Medications:  The flagging and interpretation on this report are based on the  following declared medications.  Unexpected results may arise from  inaccuracies in the declared medications.   **Note: The testing scope of this panel includes these medications:   Oxycodone (Percocet)   **Note: The testing scope of this panel does not include the  following reported medications:   Acetaminophen  Acetaminophen (Percocet)  Amitriptyline  Amlodipine (Norvasc)  Gabapentin  Losartan  Menthol  Metoprolol  Polyethylene Glycol (MiraLAX)  Promethazine  Rosuvastatin  Sucralfate (Carafate) ==================================================================== For clinical consultation, please call [phone]. ====================================================================        ROS  Constitutional: Denies any fever or chills Gastrointestinal: No reported hemesis, hematochezia, vomiting, or acute GI distress Musculoskeletal:  +LBP, b/l leg pain Neurological: No reported episodes of acute onset apraxia, aphasia, dysarthria, agnosia, amnesia, paralysis, loss of coordination, or loss of consciousness  Medication Review  Menthol (Topical Analgesic), acetaminophen, amLODipine, amitriptyline, gabapentin, metoprolol succinate, oxyCODONE-acetaminophen, polyethylene glycol, promethazine, rosuvastatin, and sucralfate  History Review  Allergy: [name] has No Known Allergies. Drug: [name]  reports no history of drug use. Alcohol:  reports no history of alcohol use. Tobacco:  reports that she has been smoking cigarettes. She has a 28.00 pack-year smoking history. She has never used smokeless  tobacco. Social: [name]  reports that she has been smoking cigarettes. She has a 28.00 pack-year smoking history. She has never used smokeless tobacco. She reports that she does not drink alcohol and does not use drugs. Medical:  has a past medical history of AICD (automatic cardioverter/defibrillator) present ([place] cardiologist--- dr Caryl Comes ), Arthritis, Bladder cancer [facility]) (urologist-  dr Junious Silk), Chronic hyponatremia, Chronic low back pain with bilateral sciatica, Chronic systolic (congestive) heart failure ([place]), COPD with emphysema ([place]), Coronary artery disease (cardiologist-  dr Kathlyn [place]), DDD (degenerative disc disease), thoracolumbar, Frequent urination, Full dentures, Gait instability, GERD (gastroesophageal reflux disease), History of iron deficiency anemia (11/2013), History of stomach ulcers (11/2013), Hypertension, LBBB (left bundle branch block), NICM (nonischemic cardiomyopathy) ([place]) (last echo 12-31-2015 ef 55-60%), Nocturia more than twice per night, S/P insertion of spinal cord stimulator (followed by [facility]---- Dr Pecola Leisure (notes in care everywhere)), and Scoliosis. Surgical: [name]  has a past surgical history that includes Esophagogastroduodenoscopy (N/A, 11/28/2013); Transurethral resection of bladder tumor (N/A, 12/13/2013); Transurethral  resection of bladder tumor (N/A, 01/17/2014); Cataract extraction w/ intraocular lens  implant, bilateral (Bilateral, right 12-2013 / left  02-2014); Cystoscopy with retrograde pyelogram, ureteroscopy and stent placement (Bilateral, 04/18/2014); Transurethral resection of bladder tumor (N/A, 04/18/2014); Lumbar disc surgery (1980's); bi-ventricular implantable cardioverter defibrillator (N/A, 05/07/2014); Cystoscopy w/ retrogrades (Bilateral, 01/06/2015); Cystoscopy with biopsy (N/A, 10/06/2015); Cardiac catheterization (03-03-2014  dr Kathlyn [place]   [facility]); Transurethral resection of bladder (1995); Cardiovascular stress test (11/25/2013);  transthoracic echocardiogram (12/31/2015   dr Caryl Comes); Cystoscopy with fulgeration (N/A, 06/30/2017); Spinal cord stimulator implant (10-09-2018   @[facility] ); Cystoscopy with biopsy (N/A, 02/12/2019); Transurethral resection of bladder tumor (N/A, 12/13/2019); and Transurethral resection of bladder tumor (N/A, 10/06/2020). Family: family history includes Cancer in her brother and mother.  Laboratory Chemistry Profile   Renal Lab Results  Component Value Date   BUN 12 10/06/2020   CREATININE 0.52 10/06/2020   BCR 18 04/28/2014   GFR 89.19 08/27/2019   GFRAA >60 06/17/2019   GFRNONAA >60 10/06/2020     Hepatic Lab Results  Component Value Date   AST 38 (H) 08/27/2019   ALT 32 08/27/2019   ALBUMIN 4.5 08/27/2019   ALKPHOS 83 08/27/2019   LIPASE 30 11/23/2013     Electrolytes Lab Results  Component Value Date   NA 138 10/06/2020   K 4.3 10/06/2020   CL 102 10/06/2020   CALCIUM 9.8 10/06/2020   MG 2.3 06/03/2019     Bone Lab Results  Component Value Date   VD25OH 36.17 01/11/2019     Inflammation (CRP: Acute Phase) (ESR: Chronic Phase) No results found for: CRP, ESRSEDRATE, LATICACIDVEN     Note: Above Lab results reviewed.   Physical Exam  General appearance: Well nourished, well developed, and well hydrated. In no apparent acute distress Mental status: Alert, oriented x 3 (person, place, & time)       Respiratory: No evidence of acute respiratory distress Eyes: PERLA Vitals: BP (!) 138/96 (BP Location: Left Arm, Patient Position: Sitting, Cuff Size: Normal)   Pulse 77   Temp (!) 96.7 F (35.9 C) (Temporal)   Resp 16   Ht 5' 7"  (1.702 m)   Wt 120 lb (54.4 kg)   SpO2 100%   BMI 18.79 kg/m  BMI: Estimated body mass index is 18.79 kg/m as calculated from the following:   Height as of this encounter: 5' 7"  (1.702 m).   Weight as of this encounter: 120 lb (54.4 kg). Ideal: Ideal body weight: 61.6 kg (135 lb 12.9 oz)    Lumbar Exam  Skin & Axial Inspection: Well  healed scar from previous spine surgery detected, IPG for SCS in place Alignment: Symmetrical Functional ROM: Pain restricted ROM       Stability: No instability detected Muscle Tone/Strength: Functionally intact. No obvious neuro-muscular anomalies detected. Sensory (Neurological): Dermatomal pain pattern Palpation: No palpable anomalies           Gait & Posture Assessment  Ambulation: Patient ambulates using a walker Gait: Limited. Using assistive device to ambulate Posture: Difficulty standing up straight, due to pain    Lower Extremity Exam      Side: Right lower extremity   Side: Left lower extremity  Stability: No instability observed           Stability: No instability observed          Skin & Extremity Inspection: Skin color, temperature, and hair growth are WNL. No peripheral edema or cyanosis. No masses, redness, swelling, asymmetry, or associated skin  lesions. No contractures.   Skin & Extremity Inspection: Skin color, temperature, and hair growth are WNL. No peripheral edema or cyanosis. No masses, redness, swelling, asymmetry, or associated skin lesions. No contractures.  Functional ROM: Pain restricted ROM for all joints of the lower extremity           Functional ROM: Pain restricted ROM for all joints of the lower extremity          Muscle Tone/Strength: Functionally intact. No obvious neuro-muscular anomalies detected.   Muscle Tone/Strength: Functionally intact. No obvious neuro-muscular anomalies detected.  Sensory (Neurological): Dermatomal pain pattern         Sensory (Neurological): Dermatomal pain pattern        DTR: Patellar: deferred today Achilles: deferred today Plantar: deferred today   DTR: Patellar: deferred today Achilles: deferred today Plantar: deferred today  Palpation: No palpable anomalies   Palpation: No palpable anomalies       Assessment   Status Diagnosis  Persistent Persistent Persistent 1. Chronic pain syndrome   2. Lumbar  radiculopathy   3. Lumbar degenerative disc disease   4. Spinal stenosis, lumbar region, with neurogenic claudication   5. Lumbar facet arthropathy   6. Spinal cord stimulator status   7. SI joint arthritis        Plan of Care   [name] has a current medication list which includes the following long-term medication(s): amitriptyline, amlodipine, gabapentin, metoprolol succinate, promethazine, rosuvastatin, and sucralfate.  Pharmacotherapy (Medications Ordered): Meds ordered this encounter  Medications   oxyCODONE-acetaminophen (PERCOCET) 10-325 MG tablet    Sig: Take 1 tablet by mouth every 4 (four) hours as needed for pain.    Dispense:  150 tablet    Refill:  0   oxyCODONE-acetaminophen (PERCOCET) 10-325 MG tablet    Sig: Take 1 tablet by mouth every 4 (four) hours as needed for pain.    Dispense:  150 tablet    Refill:  0   oxyCODONE-acetaminophen (PERCOCET) 10-325 MG tablet    Sig: Take 1 tablet by mouth every 4 (four) hours as needed for pain.    Dispense:  150 tablet    Refill:  0  Continue amitriptyline 10 mg nightly, gabapentin 300 mg 3 times daily. Continue with spinal cord stimulation.  Follow-up plan:   Return in about 3 months (around 02/10/2021) for Medication Management, in person.   Recent Visits No visits were found meeting these conditions. Showing recent visits within past 90 days and meeting all other requirements Today's Visits Date Type Provider Dept  11/10/20 Office Visit [name], MD [facility]  Showing today's visits and meeting all other requirements Future Appointments No visits were found meeting these conditions. Showing future appointments within next 90 days and meeting all other requirements I discussed the assessment and treatment plan with the patient. The patient was provided an opportunity to ask questions and all were answered. The patient agreed with the plan and demonstrated an understanding of the  instructions.  Patient advised to call back or seek an in-person evaluation if the symptoms or condition worsens.  Duration of encounter: 30 minutes.  Note by: [name], MD Date: 11/10/2020; Time: 2:25 PM

## 2020-11-12 ENCOUNTER — Other Ambulatory Visit: Payer: Self-pay

## 2020-11-19 HISTORY — PX: OTHER SURGICAL HISTORY: SHX169

## 2020-12-09 ENCOUNTER — Encounter: Payer: Self-pay | Admitting: *Deleted

## 2020-12-09 ENCOUNTER — Other Ambulatory Visit: Payer: Self-pay | Admitting: Physician Assistant

## 2020-12-09 ENCOUNTER — Other Ambulatory Visit: Payer: Self-pay

## 2020-12-09 DIAGNOSIS — Z8551 Personal history of malignant neoplasm of bladder: Secondary | ICD-10-CM | POA: Insufficient documentation

## 2020-12-09 DIAGNOSIS — K5900 Constipation, unspecified: Secondary | ICD-10-CM

## 2020-12-09 DIAGNOSIS — R1110 Vomiting, unspecified: Secondary | ICD-10-CM | POA: Diagnosis not present

## 2020-12-09 DIAGNOSIS — Z8616 Personal history of COVID-19: Secondary | ICD-10-CM | POA: Insufficient documentation

## 2020-12-09 DIAGNOSIS — I110 Hypertensive heart disease with heart failure: Secondary | ICD-10-CM | POA: Diagnosis not present

## 2020-12-09 DIAGNOSIS — G894 Chronic pain syndrome: Secondary | ICD-10-CM | POA: Diagnosis not present

## 2020-12-09 DIAGNOSIS — K594 Anal spasm: Secondary | ICD-10-CM | POA: Diagnosis not present

## 2020-12-09 DIAGNOSIS — Z950 Presence of cardiac pacemaker: Secondary | ICD-10-CM | POA: Insufficient documentation

## 2020-12-09 DIAGNOSIS — R109 Unspecified abdominal pain: Secondary | ICD-10-CM

## 2020-12-09 DIAGNOSIS — F17210 Nicotine dependence, cigarettes, uncomplicated: Secondary | ICD-10-CM | POA: Insufficient documentation

## 2020-12-09 DIAGNOSIS — I5022 Chronic systolic (congestive) heart failure: Secondary | ICD-10-CM | POA: Diagnosis not present

## 2020-12-09 DIAGNOSIS — K219 Gastro-esophageal reflux disease without esophagitis: Secondary | ICD-10-CM | POA: Diagnosis not present

## 2020-12-09 DIAGNOSIS — J449 Chronic obstructive pulmonary disease, unspecified: Secondary | ICD-10-CM | POA: Insufficient documentation

## 2020-12-09 DIAGNOSIS — I2510 Atherosclerotic heart disease of native coronary artery without angina pectoris: Secondary | ICD-10-CM | POA: Diagnosis not present

## 2020-12-09 DIAGNOSIS — C679 Malignant neoplasm of bladder, unspecified: Secondary | ICD-10-CM | POA: Diagnosis not present

## 2020-12-09 DIAGNOSIS — Z8719 Personal history of other diseases of the digestive system: Secondary | ICD-10-CM | POA: Diagnosis not present

## 2020-12-09 DIAGNOSIS — K6289 Other specified diseases of anus and rectum: Secondary | ICD-10-CM | POA: Diagnosis not present

## 2020-12-09 DIAGNOSIS — Z79899 Other long term (current) drug therapy: Secondary | ICD-10-CM | POA: Diagnosis not present

## 2020-12-09 LAB — COMPREHENSIVE METABOLIC PANEL
ALT: 25 U/L (ref 0–44)
AST: 33 U/L (ref 15–41)
Albumin: 4.6 g/dL (ref 3.5–5.0)
Alkaline Phosphatase: 78 U/L (ref 38–126)
Anion gap: 7 (ref 5–15)
BUN: 11 mg/dL (ref 8–23)
CO2: 27 mmol/L (ref 22–32)
Calcium: 9.8 mg/dL (ref 8.9–10.3)
Chloride: 98 mmol/L (ref 98–111)
Creatinine, Ser: 0.53 mg/dL (ref 0.44–1.00)
GFR, Estimated: >60 mL/min (ref >60)
Glucose, Bld: 104 mg/dL — ABNORMAL HIGH (ref 70–99)
Potassium: 3.6 mmol/L (ref 3.5–5.1)
Sodium: 132 mmol/L — ABNORMAL LOW (ref 135–145)
Total Bilirubin: 0.8 mg/dL (ref 0.3–1.2)
Total Protein: 7.2 g/dL (ref 6.5–8.1)

## 2020-12-09 LAB — CBC
HCT: 41.3 % (ref 36.0–46.0)
Hemoglobin: 14.1 g/dL (ref 12.0–15.0)
MCH: 32.6 pg (ref 26.0–34.0)
MCHC: 34.1 g/dL (ref 30.0–36.0)
MCV: 95.6 fL (ref 80.0–100.0)
Platelets: 154 10*3/uL (ref 150–400)
RBC: 4.32 MIL/uL (ref 3.87–5.11)
RDW: 13.7 % (ref 11.5–15.5)
WBC: 6.2 10*3/uL (ref 4.0–10.5)
nRBC: 0 % (ref 0.0–0.2)

## 2020-12-09 NOTE — ED Triage Notes (Addendum)
Pt to ED reporting rectal pain that she was seen for at a clinic earlier today. Pt does not have hemorrhoids or fissures. No blood in stool. Pt was given hydrocortisone steroids and has been taking prescribed 10-325 percocet without relief. Pt has a scheduled CT and colonoscopy weeks from now but reports the pain has worsened to where she can not wait that long.

## 2020-12-10 ENCOUNTER — Emergency Department
Admission: EM | Admit: 2020-12-10 | Discharge: 2020-12-10 | Disposition: A | Discharge status: Home / Self Care | Payer: Medicare Other | Attending: Emergency Medicine | Admitting: Emergency Medicine

## 2020-12-10 ENCOUNTER — Emergency Department: Payer: Medicare Other

## 2020-12-10 DIAGNOSIS — R109 Unspecified abdominal pain: Secondary | ICD-10-CM | POA: Diagnosis not present

## 2020-12-10 DIAGNOSIS — R1110 Vomiting, unspecified: Secondary | ICD-10-CM | POA: Diagnosis not present

## 2020-12-10 DIAGNOSIS — K594 Anal spasm: Secondary | ICD-10-CM

## 2020-12-10 MED ORDER — DILTIAZEM GEL 2 %: 1.0000 "application " | Freq: Three times a day (TID) | CUTANEOUS | 0 refills | Status: DC | PRN

## 2020-12-10 MED ORDER — IOHEXOL 350 MG/ML SOLN
80.0000 mL | Freq: Once | INTRAVENOUS | Status: AC | PRN
  Administered 2020-12-10: 80 mL via INTRAVENOUS
  Filled 2020-12-10: qty 80

## 2020-12-10 MED ORDER — IOHEXOL 9 MG/ML PO SOLN
500.0000 mL | ORAL | Status: AC
  Administered 2020-12-10: 500 mL via ORAL
  Filled 2020-12-10 (×2): qty 500

## 2020-12-10 NOTE — Discharge Instructions (Signed)
As we discussed, your work-up tonight was reassuring, including the CT scan of the abdomen and pelvis with oral and IV contrast.  No specific explanation for your anal/rectal pain was identified.  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  Please try taking your regular medications including your previously prescribed pain medicine.  Additionally, you were given a prescription for a gel which can be applied to the anus and just inside the rectum (like a suppository) when the onset of more severe pain occurs.  Please follow-up with your GI doctor at the next available opportunity.  Continue to take the medications you were previously prescribed by your GI provider.    Return to the emergency department if you develop new or worsening symptoms that concern you.

## 2020-12-10 NOTE — ED Provider Notes (Signed)
[facility] Emergency Department Provider Note  ____________________________________________   Event Date/Time   First MD Initiated Contact with Patient 12/10/20 [phone]     (approximate)  I have reviewed the triage vital signs and the nursing notes.   HISTORY  Chief Complaint Rectal Bleeding    HPI [name] is a 80 y.o. female with medical history as listed below who presents for evaluation of rectal pain.  She said that this has been going on for at least a week.  She was seen at her GI doctor in [place] and saw APP who did an evaluation has a plan for outpatient work-up but there is no obvious reason for the pain.  However she feels that she cannot wait a few weeks for a CT scan and other evaluation.  The pain is sharp and constant although it does wax and wane in severity.  She has had no trauma and no rectal bleeding (the chief complaint is misleading).  She has had no nausea nor vomiting.  She has had no abdominal pain.  No recent weight loss.  She denies fever, sore throat, chest pain, shortness of breath.  Nothing in particular makes it better or worse.    Past Medical History:  Diagnosis Date   AICD (automatic cardioverter/defibrillator) present EP cardiologist--- dr Caryl Comes    placement 05-07-2014 , ef 25%,  NICM/  (02-11-2019 last echo 08-31-2018 ef 60-65%)   Arthritis    "in about all my joints; for sure in my back"   Bladder cancer [facility]) urologist-  dr Junious Silk   dx 1995--  recurrent bladder cancer 2015 , s/p TURBT's and chemo instillation's ;   04/ 2019  s/p TURBT   Chronic hyponatremia    Chronic low back pain with bilateral sciatica    s/p  spinal cord stimulator @ [facility]  [phone]   Chronic systolic (congestive) heart failure [facility])    cardiologist-  dr Rogue Jury   COPD with emphysema ([place])    (02-11-2019  per pt has never been on oxygen)   Coronary artery disease cardiologist-  dr Kathlyn [place]   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% per  cardiac cath 03-03-2014   DDD (degenerative disc disease), thoracolumbar    Frequent urination    Full dentures    Gait instability    due to chronic low back pain, uses roller walker   GERD (gastroesophageal reflux disease)    History of iron deficiency anemia 11/2013   resolved w/ IV Iron  (02-11-2019  per pt has not had any issues since 2015)   History of stomach ulcers 11/2013   Hypertension    LBBB (left bundle branch block)    NICM (nonischemic cardiomyopathy) ([place]) last echo 12-31-2015 ef 55-60%   dx 09/ 2015 per echo 20%;  myoview 09/ 2015 ef 28%;  per cardiac cath 12/ 2015 ef 30%;     Nocturia more than twice per night    S/P insertion of spinal cord stimulator followed by [facility]---- Dr Pecola Leisure (notes in care everywhere)   10-09-2018  @[facility] --- thoracic spinal cord stimular/ generator  (device from [facility])---- per pt has a control   Scoliosis     Patient Active Problem List   Diagnosis Date Noted   Spinal cord stimulator status 11/13/2019   Back pain 09/20/2019   Non-ischemic cardiomyopathy ([place]) 09/16/2019   PVC (premature ventricular contraction) 09/16/2019   Sinus bradycardia - borderline 09/16/2019   Gastritis 08/27/2019   Cellulitis of concha  of right ear 08/27/2019   Orthostatic hypotension 06/21/2019   Generalized weakness 06/21/2019   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 06/19/2019   Lumbar facet arthropathy 02/07/2019   Osteoporosis 08/28/2018   Educated about COVID-19 virus infection 07/21/2018   PAD (peripheral artery disease) ([place]) 07/13/2018   Unintentional weight loss 06/09/2018   Chronic pain syndrome 02/06/2018   Lumbar radiculopathy 02/06/2018   Lumbar spondylosis 07/11/2017   SI joint arthritis 07/11/2017   Lumbar degenerative disc disease 07/11/2017   Hyperlipidemia 03/26/2017   S/P placement of cardiac pacemaker 03/26/2017   Coronary artery disease 03/26/2017   Parkinsonian features 09/06/2016    Hypertension 09/06/2016   Spinal stenosis, lumbar region, 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/18/2016   Other idiopathic scoliosis, lumbar region 08/18/2016   NSAID induced gastritis 05/26/2016   Chronic bilateral low back pain 05/15/2016   Spinal stenosis in cervical region 01/19/2014   Constipation 12/29/2013   Spinal stenosis of thoracolumbar region 12/27/2013   COPD (chronic obstructive pulmonary disease) ([place]) 42/59/5638   Chronic systolic heart failure ([place]) 12/17/2013   History of gastric ulcer 11/28/2013   Congestive dilated cardiomyopathy ([place]) 11/25/2013   Bladder cancer ([place]) 11/24/2013   LBBB (left bundle branch block) 11/24/2013   Atrial ectopy 11/24/2013   Hyponatremia 11/22/2013    Past Surgical History:  Procedure Laterality Date   BI-VENTRICULAR IMPLANTABLE CARDIOVERTER DEFIBRILLATOR N/A 05/07/2014   Procedure: BI-VENTRICULAR IMPLANTABLE CARDIOVERTER DEFIBRILLATOR  (CRT-D);  Surgeon: Deboraha Sprang, MD;  Location: [facility] CATH LAB;  Service: Cardiovascular;  Laterality: N/A;   CARDIAC CATHETERIZATION  03-03-2014  dr Kathlyn [place]   [facility]   pLAD 20%, pRCA 20%, dRCA 50%, RPLS 50%;  ef 30%, mild elevated LVEDP, mild gradient across aortic valve LVOT   CARDIOVASCULAR STRESS TEST  11/25/2013   High risk nuclear study w/ large high severity inferior wall perfusion defect on stress and rest images, large mild severity anteroseptal wall perfusion defect on stress and rest images, No inducible ischemia/ global moderate hypokinesis, ef 28%   CATARACT EXTRACTION W/ INTRAOCULAR LENS  IMPLANT, BILATERAL Bilateral right 12-2013 / left  02-2014   CYSTOSCOPY W/ RETROGRADES Bilateral 01/06/2015   Procedure: CYSTOSCOPY WITH  BLADDER BIOPSY BILATERAL RETROGRADE PYELOGRAM,INSTILLATION OF MITOMYCIN C;  Surgeon: [name], MD;  Location: [facility];  Service: Urology;  Laterality: Bilateral;   CYSTOSCOPY WITH BIOPSY N/A 10/06/2015   Procedure: CYSTO WITH BLADDER BIOPSY, FULGERATION, CHEMO IRRIGATION  EPIRUBICIN IN PACU;  Surgeon: [name], MD;  Location: [facility];  Service: Urology;  Laterality: N/A;   CYSTOSCOPY WITH BIOPSY N/A 02/12/2019   Procedure: CYSTOSCOPY WITH BIOPSY/ FULGURATION/ INSTILLATION OF GEMCITABINE, bilateral retrograde turbt greater 5cm;  Surgeon: [name], MD;  Location: [facility];  Service: Urology;  Laterality: N/A;   CYSTOSCOPY WITH FULGERATION N/A 06/30/2017   Procedure: Marland Kitchen CYSTOSCOPY WITH [place] FULGERATION/ BLADDER BIOPSY/ INSTILLATION OF EPIRUBICIN;  Surgeon: [name], MD;  Location: [facility];  Service: Urology;  Laterality: N/A;   CYSTOSCOPY WITH RETROGRADE PYELOGRAM, URETEROSCOPY AND STENT PLACEMENT Bilateral 04/18/2014   Procedure: CYSTOSCOPY WITH RETROGRADE PYELOGRAM;  Surgeon: [name], MD;  Location: [facility];  Service: Urology;  Laterality: Bilateral;   ESOPHAGOGASTRODUODENOSCOPY N/A 11/28/2013   Procedure: ESOPHAGOGASTRODUODENOSCOPY (EGD);  Surgeon: Arta Silence, MD;  Location: [facility] ENDOSCOPY;  Service: Endoscopy;  Laterality: N/A;   LUMBAR DISC SURGERY  1980's   "ruptured disc"   SPINAL CORD STIMULATOR IMPLANT  10-09-2018   @[facility]    Thoracic spinal cord stimular/ genertor  (left flank)----- (  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[employer])   TRANSTHORACIC ECHOCARDIOGRAM  12/31/2015   dr Caryl Comes   ef 54-650, grade 1 diastolic dysfunction/ mild MR/ septal motion showed abnormal function and dyssynergy   TRANSURETHRAL RESECTION OF BLADDER  1995   TRANSURETHRAL RESECTION OF BLADDER TUMOR N/A 12/13/2013   Procedure: TRANSURETHRAL RESECTION OF BLADDER TUMOR (TURBT);  Surgeon: [name], MD;  Location: [facility];  Service: Urology;  Laterality: N/A;   TRANSURETHRAL RESECTION OF BLADDER TUMOR N/A 01/17/2014   Procedure: TRANSURETHRAL RESECTION OF BLADDER TUMOR (TURBT);  Surgeon: [name], MD;  Location: [facility];  Service: Urology;  Laterality: N/A;   TRANSURETHRAL RESECTION OF BLADDER TUMOR N/A 04/18/2014    Procedure: TRANSURETHRAL RESECTION OF BLADDER TUMOR (TURBT), CYSTOGRAM;  Surgeon: [name], MD;  Location: [facility];  Service: Urology;  Laterality: N/A;   TRANSURETHRAL RESECTION OF BLADDER TUMOR N/A 12/13/2019   Procedure: TRANSURETHRAL RESECTION OF BLADDER TUMOR (TURBT) GREATER THAN 5CM WITH CYSTOSCOPY/ POST OPERATIVE INSTILLATION OF GEMCITABINE;  Surgeon: [name], MD;  Location: [facility];  Service: Urology;  Laterality: N/A;   TRANSURETHRAL RESECTION OF BLADDER TUMOR N/A 10/06/2020   Procedure: TRANSURETHRAL RESECTION OF BLADDER TUMOR (TURBT)/ CYSTOSCOPY/  BLADDER BIOPSY;  Surgeon: [name], MD;  Location: [facility];  Service: Urology;  Laterality: N/A;    Prior to Admission medications   Medication Sig Start Date End Date Taking? Authorizing Provider  diltiazem 2 % GEL Apply 1 application topically 3 (three) times daily as needed (anal/rectal pain). 12/10/20  Yes Hinda Kehr, MD  acetaminophen (TYLENOL) 500 MG tablet Take 1,000 mg by mouth every 8 (eight) hours as needed for moderate pain.    [provider]  amitriptyline (ELAVIL) 10 MG tablet Take 1 tablet (10 mg total) by mouth at bedtime. 08/13/19   [name], MD  amLODipine (NORVASC) 2.5 MG tablet TAKE 1 TABLET(2.5 MG) BY MOUTH DAILY 10/13/20   Deboraha Sprang, MD  gabapentin (NEURONTIN) 300 MG capsule TAKE ONE CAPSULE BY MOUTH THREE TIMES DAILY AS NEEDED FOR BACK PAIN Patient taking differently: Take 300 mg by mouth 3 (three) times daily as needed (back pain). TAKE ONE CAPSULE BY MOUTH THREE TIMES DAILY AS NEEDED FOR BACK PAIN 08/11/20   [name], MD  Menthol, Topical Analgesic, (BIOFREEZE EX) Apply 1 application topically 4 (four) times daily as needed (back pain).    [provider]  metoprolol succinate (TOPROL-XL) 25 MG 24 hr tablet TAKE 1 TABLET(25 MG) BY MOUTH DAILY 10/13/20   Deboraha Sprang, MD  oxyCODONE-acetaminophen (PERCOCET) 10-325 MG tablet Take 1 tablet by mouth  every 4 (four) hours as needed for pain. 11/10/20 12/10/20  [name], MD  oxyCODONE-acetaminophen (PERCOCET) 10-325 MG tablet Take 1 tablet by mouth every 4 (four) hours as needed for pain. 12/10/20 01/09/21  [name], MD  oxyCODONE-acetaminophen (PERCOCET) 10-325 MG tablet Take 1 tablet by mouth every 4 (four) hours as needed for pain. 01/09/21 02/08/21  [name], MD  polyethylene glycol (MIRALAX / GLYCOLAX) packet Take 17 g by mouth daily as needed for mild constipation.     [provider]  promethazine (PHENERGAN) 12.5 MG tablet TAKE 1 TABLET(12.5 MG) BY MOUTH EVERY 8 HOURS AS NEEDED FOR NAUSEA OR VOMITING Patient taking differently: Take 12.5 mg by mouth every 8 (eight) hours as needed for nausea or vomiting. 04/02/20   [name], MD  rosuvastatin (CRESTOR) 5 MG tablet TAKE 1 TABLET(5 MG) BY MOUTH DAILY Patient taking differently: Take 5 mg by mouth daily.  09/18/20   [name], MD  sucralfate (CARAFATE) 1 g tablet TAKE 1 TABLET BY MOUTH ONE HOUR BEFORE EACH MEAL AND 2 HOURS AFTER OTHER MEDICATIONS Patient taking differently: Take 1 g by mouth See admin instructions. TAKE 1 TABLET BY MOUTH ONE HOUR BEFORE EACH MEAL AND 2 HOURS AFTER OTHER MEDICATIONS 03/11/20   [name], MD    Allergies Patient has no known allergies.  Family History  Problem Relation Age of Onset   Cancer Mother    Cancer Brother     Social History Social History   Tobacco Use   Smoking status: Every Day    Packs/day: 0.50    Years: 56.00    Pack years: 28.00    Types: Cigarettes   Smokeless tobacco: Never   Tobacco comments:    02-11-2019  per pt currently smokes 10 cig's per day , started smoking age 71  Vaping Use   Vaping Use: Never used  Substance Use Topics   Alcohol use: No   Drug use: No    Review of Systems Constitutional: No fever/chills Eyes: No visual changes. ENT: No sore throat. Cardiovascular: Denies chest pain. Respiratory: Denies shortness of  breath. Gastrointestinal: Severe rectal pain that comes in waves.  Denies abdominal pain, nausea, vomiting, and diarrhea. Genitourinary: Negative for dysuria. Musculoskeletal: Negative for neck pain.  Negative for back pain. Integumentary: Negative for rash. Neurological: Negative for headaches, focal weakness or numbness.   ____________________________________________   PHYSICAL EXAM:  VITAL SIGNS: ED Triage Vitals [12/09/20 2143]  Enc Vitals Group     BP 124/60     Pulse Rate 66     Resp 16     Temp 98.2 F (36.8 C)     Temp Source Oral     SpO2 97 %     Weight 54.4 kg (119 lb 14.9 oz)     Height 1.727 m (5\' 8" )     Head Circumference      Peak Flow      Pain Score 10     Pain Loc      Pain Edu?      Excl. in [place]?     Constitutional: Alert and oriented.  Eyes: Conjunctivae are normal.  Head: Atraumatic. Nose: No congestion/rhinnorhea. Mouth/Throat: Patient is wearing a mask. Neck: No stridor.  No meningeal signs.   Cardiovascular: Normal rate, regular rhythm. Good peripheral circulation. Respiratory: Normal respiratory effort.  No retractions. Gastrointestinal: Soft and nontender. No distention.  Rectal: Normal external rectal exam.  Nontender rectal exam, no evidence of hemorrhoids nor abscess nor fissure.  Hemoccult negative.  Nurse chaperone present throughout exam. Musculoskeletal: No lower extremity tenderness nor edema. No gross deformities of extremities. Neurologic:  Normal speech and language. No gross focal neurologic deficits are appreciated.  Skin:  Skin is warm, dry and intact. Psychiatric: Mood and affect are normal. Speech and behavior are normal.  ____________________________________________   LABS (all labs ordered are listed, but only abnormal results are displayed)  Labs Reviewed  COMPREHENSIVE METABOLIC PANEL - Abnormal; Notable for the following components:      Result Value   Sodium 132 (*)    Glucose, Bld 104 (*)    All other  components within normal limits  CBC   ____________________________________________   RADIOLOGY I, Hinda Kehr, personally viewed and evaluated these images (plain radiographs) as part of my medical decision making, as well as reviewing the written report by the radiologist.  ED MD interpretation: No acute abnormality  other than the question of bladder distention.  Nothing to explain the rectal pain.  Official radiology report(s): CT ABDOMEN PELVIS W CONTRAST  Result Date: 12/10/2020 CLINICAL DATA:  80 year old female with acute abdominal pain. Rectal pain, nausea vomiting. EXAM: CT ABDOMEN AND PELVIS WITH CONTRAST TECHNIQUE: Multidetector CT imaging of the abdomen and pelvis was performed using the standard protocol following bolus administration of intravenous contrast. CONTRAST:  36mL OMNIPAQUE IOHEXOL 350 MG/ML SOLN COMPARISON:  CT Abdomen and Pelvis 09/19/2016 and earlier. FINDINGS: Lower chest: Stable. Partially visible cardiac pacemaker leads. Negative lung bases. Hepatobiliary: Liver, gallbladder, and bile ducts appear stable since 2018, within normal limits for age. Pancreas: There is chronic prominence of the main pancreatic duct, but no acute pancreatic inflammation. No pancreatic mass identified. Spleen: Negative. Adrenals/Urinary Tract: Adrenal glands are stable and within normal limits. A degree of bilateral renal cortical atrophy has taken place since 2015. But the kidneys enhance symmetrically, and appear nonobstructed with symmetric renal contrast excretion on delayed images. No nephrolithiasis or renal mass identified. There is some renal vascular calcification. The bladder is distended but otherwise unremarkable (estimated bladder volume 625 mL). Stomach/Bowel: Unremarkable rectum, containing some gas and stool. Extensive sigmoid diverticulosis in the pelvis, but no active inflammation identified. Redundant upstream large bowel, the transverse colon extends into the anterior pelvis.  Oral contrast has reached the mid transverse colon without evidence of obstruction. No large bowel inflammation is identified. Normal appendix visible (coronal image 36). Terminal ileum is decompressed and negative. No dilated small bowel. Stomach and duodenum are within normal limits. No free air or free fluid. Vascular/Lymphatic: Aortoiliac calcified atherosclerosis. Mildly tortuous distal aorta is stable since 2018. Major arterial structures remain patent. Portal venous system appears to be patent. No lymphadenopathy identified. Reproductive: Diminutive, normal. Other: No pelvic free fluid. Musculoskeletal: New left flank stimulator type device with electrical lead extending cephalad toward the thoracic spine. The lead remain subcutaneous on these images, although an intraspinal component is visible at the T10 level on series 2, image 1. Chronic scoliosis and advanced spine degeneration. Osteopenia. No acute osseous abnormality identified. IMPRESSION: 1. Distended bladder (625 mL). Query urinary retention. Renal cortical atrophy since 2015, but no acute renal finding. 2. No other acute or inflammatory process identified in the abdomen or pelvis. Normal appendix. Extensive sigmoid diverticulosis without active inflammation. Aortic Atherosclerosis (ICD10-I70.0). 3. Advanced spinal scoliosis and degeneration with new left flank stimulator device since 2018, probably terminating in the thoracic spine. Electronically Signed   By: Genevie Ann M.D.   On: 12/10/2020 04:36    ____________________________________________   INITIAL IMPRESSION / MDM / ASSESSMENT AND PLAN / ED COURSE  As part of my medical decision making, I reviewed the following data within the [place] History obtained from family, Nursing notes reviewed and incorporated, Labs reviewed , Old chart reviewed, and Notes from prior ED visits   Differential diagnosis includes, but is not limited to, proctalgia fugax,  constipation/obstipation, cancer, diverticulitis, perirectal/perianal abscess, fissure.  Physical exam is reassuring both by me and I saw the medical records from the GI provider which did not find any physical abnormalities to explain the symptoms.  Blood pressure is elevated but otherwise vitals are normal.  CBC and comprehensive metabolic panel are essentially normal.  I explained that this may be a diagnosis of exclusion and that there is no evidence of an emergent medical condition tonight.  Patient and her daughter are understandably concerned and frustrated.  We will proceed with CT of  the abdomen and pelvis with IV and oral contrast to evaluate for any emergent conditions, mass, etc., but I also talked to them about symptom control and the need for additional follow-up such as for a colonoscopy with her GI doctor.  She understands and is not experiencing significant pain at this moment.     Clinical Course as of 12/10/20 0507  Thu Dec 10, 2020  0218  is having the same problem [CF]  0446 CT ABDOMEN PELVIS W CONTRAST Reassuring CT scan with no evidence of acute abnormality.  Although her bladder appears distended, she has urinated multiple times in the emergency department, and she has no evidence of urinary retention at this time.  I will update the patient and her daughter. [CF]  0503 I have dated the patient and her daughter about the results.  I will prescribe 2% topical diltiazem as a possible treatment for proctalgia fugax.  I encouraged close outpatient follow-up.  The patient and her daughter understand and agree with the plan. [CF]    Clinical Course User Index [CF] Hinda Kehr, MD     ____________________________________________  FINAL CLINICAL IMPRESSION(S) / ED DIAGNOSES  Final diagnoses:  Proctalgia fugax     MEDICATIONS GIVEN DURING THIS VISIT:  Medications  iohexol (OMNIPAQUE) 9 MG/ML oral solution 500 mL (500 mLs Oral Contrast Given 12/10/20 0128)  iohexol  (OMNIPAQUE) 350 MG/ML injection 80 mL (80 mLs Intravenous Contrast Given 12/10/20 0344)     ED Discharge Orders          Ordered    diltiazem 2 % GEL  3 times daily PRN        12/10/20 0505             Note:  This document was prepared using Dragon voice recognition software and may include unintentional dictation errors.   Hinda Kehr, MD 12/10/20 [phone]

## 2020-12-11 ENCOUNTER — Emergency Department
Admission: EM | Admit: 2020-12-11 | Discharge: 2020-12-11 | Disposition: A | Discharge status: Home / Self Care | Payer: Medicare Other | Attending: Student in an Organized Health Care Education/Training Program | Admitting: Student in an Organized Health Care Education/Training Program

## 2020-12-11 ENCOUNTER — Other Ambulatory Visit: Payer: Self-pay

## 2020-12-11 DIAGNOSIS — Z79899 Other long term (current) drug therapy: Secondary | ICD-10-CM | POA: Diagnosis not present

## 2020-12-11 DIAGNOSIS — R531 Weakness: Secondary | ICD-10-CM | POA: Diagnosis not present

## 2020-12-11 DIAGNOSIS — I110 Hypertensive heart disease with heart failure: Secondary | ICD-10-CM | POA: Diagnosis not present

## 2020-12-11 DIAGNOSIS — I5022 Chronic systolic (congestive) heart failure: Secondary | ICD-10-CM | POA: Diagnosis not present

## 2020-12-11 DIAGNOSIS — K6289 Other specified diseases of anus and rectum: Secondary | ICD-10-CM | POA: Diagnosis not present

## 2020-12-11 DIAGNOSIS — J449 Chronic obstructive pulmonary disease, unspecified: Secondary | ICD-10-CM | POA: Insufficient documentation

## 2020-12-11 DIAGNOSIS — Z95810 Presence of automatic (implantable) cardiac defibrillator: Secondary | ICD-10-CM | POA: Diagnosis not present

## 2020-12-11 DIAGNOSIS — F17210 Nicotine dependence, cigarettes, uncomplicated: Secondary | ICD-10-CM | POA: Insufficient documentation

## 2020-12-11 DIAGNOSIS — I2510 Atherosclerotic heart disease of native coronary artery without angina pectoris: Secondary | ICD-10-CM | POA: Diagnosis not present

## 2020-12-11 DIAGNOSIS — Z8551 Personal history of malignant neoplasm of bladder: Secondary | ICD-10-CM | POA: Diagnosis not present

## 2020-12-11 LAB — URINALYSIS, COMPLETE (UACMP) WITH MICROSCOPIC
Bacteria, UA: NONE SEEN
Bilirubin Urine: NEGATIVE
Glucose, UA: NEGATIVE mg/dL
Ketones, ur: NEGATIVE mg/dL
Leukocytes,Ua: NEGATIVE
Nitrite: NEGATIVE
Protein, ur: NEGATIVE mg/dL
Specific Gravity, Urine: 1.004 — ABNORMAL LOW (ref 1.005–1.030)
pH: 7 (ref 5.0–8.0)

## 2020-12-11 MED ORDER — DIBUCAINE (PERIANAL) 1 % EX OINT: 1.0000 "application " | TOPICAL_OINTMENT | CUTANEOUS | 0 refills | Status: DC | PRN

## 2020-12-11 MED ORDER — OXYCODONE-ACETAMINOPHEN 5-325 MG PO TABS
2.0000 | ORAL_TABLET | Freq: Once | ORAL | Status: AC
  Administered 2020-12-11: 2 via ORAL
  Filled 2020-12-11: qty 2

## 2020-12-11 MED ORDER — DIBUCAINE (PERIANAL) 1 % EX OINT
TOPICAL_OINTMENT | CUTANEOUS | Status: DC | PRN
  Filled 2020-12-11 (×2): qty 28

## 2020-12-11 NOTE — ED Notes (Signed)
Pre void bladder scan shows 440ml of urine.  Pt assisted to restroom using wheelchair, voided. Assisted back to ED treatment room.  Post void bladder scan shows 215ml of urine.

## 2020-12-11 NOTE — Discharge Instructions (Addendum)
Please return if you develop any fever, abdominal pain, numbness or tingling or for intractable pain.  Follow up with PCP.

## 2020-12-11 NOTE — ED Triage Notes (Signed)
Pt presents to ED with c/o of rectal pain. Pt was seen here yesterday for the same. Pt states no changes other than "the pain is so bad I cant take it" Pt states she takes 10-325 oxy at home and has also used the gel that was Rx'ed but states "it only works for 30 minutes". Pt denies blood in stool.. Pt denies hemidrosis or fissures. Pt denies constipation or diarrhea. NAD noted.

## 2020-12-11 NOTE — ED Provider Notes (Signed)
[facility] Emergency Department Provider Note    Event Date/Time   First MD Initiated Contact with Patient 12/11/20 1642     (approximate)  I have reviewed the triage vital signs and the nursing notes.   HISTORY  Chief Complaint Rectal Pain    HPI [name] is a 80 y.o. female with below listed past medical history presents to the ER for persistent rectal pain.  States she feels like there is a circle around her anus that intermittently causes pain is throughout the day.  She states that she is not constipated she is moving her bowels normally.  Has not been having any fevers or discharge.  States that she had some improvement with diltiazem gel but it is brief in nature.  States that she wants to get her some relief from the pain.  Had extensive work-up as even seen her GI specialist with reassuring work-up as well without any clear etiology for the discomfort.  Denies any dysuria.  Past Medical History:  Diagnosis Date   AICD (automatic cardioverter/defibrillator) present EP cardiologist--- dr Caryl Comes    placement 05-07-2014 , ef 25%,  NICM/  (02-11-2019 last echo 08-31-2018 ef 60-65%)   Arthritis    "in about all my joints; for sure in my back"   Bladder cancer [facility]) urologist-  dr Junious Silk   dx 1995--  recurrent bladder cancer 2015 , s/p TURBT's and chemo instillation's ;   04/ 2019  s/p TURBT   Chronic hyponatremia    Chronic low back pain with bilateral sciatica    s/p  spinal cord stimulator @ [facility]  03-23-7251   Chronic systolic (congestive) heart failure [facility])    cardiologist-  dr Rogue Jury   COPD with emphysema ([place])    (02-11-2019  per pt has never been on oxygen)   Coronary artery disease cardiologist-  dr Kathlyn [place]   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% per cardiac cath 03-03-2014   DDD (degenerative disc disease), thoracolumbar    Frequent urination    Full dentures    Gait instability    due to chronic low back pain, uses roller walker    GERD (gastroesophageal reflux disease)    History of iron deficiency anemia 11/2013   resolved w/ IV Iron  (02-11-2019  per pt has not had any issues since 2015)   History of stomach ulcers 11/2013   Hypertension    LBBB (left bundle branch block)    NICM (nonischemic cardiomyopathy) ([place]) last echo 12-31-2015 ef 55-60%   dx 09/ 2015 per echo 20%;  myoview 09/ 2015 ef 28%;  per cardiac cath 12/ 2015 ef 30%;     Nocturia more than twice per night    S/P insertion of spinal cord stimulator followed by [facility]---- Dr Pecola Leisure (notes in care everywhere)   10-09-2018  @[facility] --- thoracic spinal cord stimular/ generator  (device from [employer])---- per pt has a control   Scoliosis    Family History  Problem Relation Age of Onset   Cancer Mother    Cancer Brother    Past Surgical History:  Procedure Laterality Date   BI-VENTRICULAR IMPLANTABLE CARDIOVERTER DEFIBRILLATOR N/A 05/07/2014   Procedure: BI-VENTRICULAR IMPLANTABLE CARDIOVERTER DEFIBRILLATOR  (CRT-D);  Surgeon: Deboraha Sprang, MD;  [facility];  Service: Cardiovascular;  Laterality: N/A;   CARDIAC CATHETERIZATION  03-03-2014  dr Kathlyn [place]   [facility]   pLAD 20%, pRCA 20%, dRCA 50%, RPLS 50%;  ef 30%, mild elevated  LVEDP, mild gradient across aortic valve LVOT   CARDIOVASCULAR STRESS TEST  11/25/2013   High risk nuclear study w/ large high severity inferior wall perfusion defect on stress and rest images, large mild severity anteroseptal wall perfusion defect on stress and rest images, No inducible ischemia/ global moderate hypokinesis, ef 28%   CATARACT EXTRACTION W/ INTRAOCULAR LENS  IMPLANT, BILATERAL Bilateral right 12-2013 / left  02-2014   CYSTOSCOPY W/ RETROGRADES Bilateral 01/06/2015   Procedure: CYSTOSCOPY WITH  BLADDER BIOPSY BILATERAL RETROGRADE PYELOGRAM,INSTILLATION OF MITOMYCIN C;  Surgeon: [name], MD;  Location: [facility];  Service: Urology;  Laterality: Bilateral;   CYSTOSCOPY WITH BIOPSY N/A  10/06/2015   Procedure: CYSTO WITH BLADDER BIOPSY, FULGERATION, CHEMO IRRIGATION EPIRUBICIN IN PACU;  Surgeon: [name], MD;  Location: [facility];  Service: Urology;  Laterality: N/A;   CYSTOSCOPY WITH BIOPSY N/A 02/12/2019   Procedure: CYSTOSCOPY WITH BIOPSY/ FULGURATION/ INSTILLATION OF GEMCITABINE, bilateral retrograde turbt greater 5cm;  Surgeon: [name], MD;  Location: [facility];  Service: Urology;  Laterality: N/A;   CYSTOSCOPY WITH FULGERATION N/A 06/30/2017   Procedure: Marland Kitchen CYSTOSCOPY WITH [place] FULGERATION/ BLADDER BIOPSY/ INSTILLATION OF EPIRUBICIN;  Surgeon: [name], MD;  Location[facility];  Service: Urology;  Laterality: N/A;   CYSTOSCOPY WITH RETROGRADE PYELOGRAM, URETEROSCOPY AND STENT PLACEMENT Bilateral 04/18/2014   Procedure: CYSTOSCOPY WITH RETROGRADE PYELOGRAM;  Surgeon: [name], MD;  Location: [facility];  Service: Urology;  Laterality: Bilateral;   ESOPHAGOGASTRODUODENOSCOPY N/A 11/28/2013   Procedure: ESOPHAGOGASTRODUODENOSCOPY (EGD);  Surgeon: Arta Silence, MD;  Location: [facility] ENDOSCOPY;  Service: Endoscopy;  Laterality: N/A;   LUMBAR DISC SURGERY  1980's   "ruptured disc"   SPINAL CORD STIMULATOR IMPLANT  10-09-2018   @[facility]    Thoracic spinal cord stimular/ genertor  (left flank)----- (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[employer])   TRANSTHORACIC ECHOCARDIOGRAM  12/31/2015   dr Caryl Comes   ef 62-831, grade 1 diastolic dysfunction/ mild MR/ septal motion showed abnormal function and dyssynergy   TRANSURETHRAL RESECTION OF BLADDER  1995   TRANSURETHRAL RESECTION OF BLADDER TUMOR N/A 12/13/2013   Procedure: TRANSURETHRAL RESECTION OF BLADDER TUMOR (TURBT);  Surgeon: [name], MD;  Location: [facility];  Service: Urology;  Laterality: N/A;   TRANSURETHRAL RESECTION OF BLADDER TUMOR N/A 01/17/2014   Procedure: TRANSURETHRAL RESECTION OF BLADDER TUMOR (TURBT);  Surgeon: [name], MD;  Location: [facility];  Service: Urology;   Laterality: N/A;   TRANSURETHRAL RESECTION OF BLADDER TUMOR N/A 04/18/2014   Procedure: TRANSURETHRAL RESECTION OF BLADDER TUMOR (TURBT), CYSTOGRAM;  Surgeon: [name], MD;  Location: [facility];  Service: Urology;  Laterality: N/A;   TRANSURETHRAL RESECTION OF BLADDER TUMOR N/A 12/13/2019   Procedure: TRANSURETHRAL RESECTION OF BLADDER TUMOR (TURBT) GREATER THAN 5CM WITH CYSTOSCOPY/ POST OPERATIVE INSTILLATION OF GEMCITABINE;  Surgeon: [name], MD;  Location: [facility];  Service: Urology;  Laterality: N/A;   TRANSURETHRAL RESECTION OF BLADDER TUMOR N/A 10/06/2020   Procedure: TRANSURETHRAL RESECTION OF BLADDER TUMOR (TURBT)/ CYSTOSCOPY/  BLADDER BIOPSY;  Surgeon: [name], MD;  Location: [facility];  Service: Urology;  Laterality: N/A;   Patient Active Problem List   Diagnosis Date Noted   Spinal cord stimulator status 11/13/2019   Back pain 09/20/2019   Non-ischemic cardiomyopathy ([place]) 09/16/2019   PVC (premature ventricular contraction) 09/16/2019   Sinus bradycardia - borderline 09/16/2019   Gastritis 08/27/2019   Cellulitis of concha of right ear 08/27/2019   Orthostatic hypotension 06/21/2019   Generalized weakness 06/21/2019   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 06/19/2019   Lumbar facet arthropathy 02/07/2019   Osteoporosis 08/28/2018   Educated about COVID-19 virus infection 07/21/2018   PAD (peripheral artery disease) ([place]) 07/13/2018   Unintentional weight loss 06/09/2018   Chronic pain syndrome 02/06/2018   Lumbar radiculopathy 02/06/2018   Lumbar spondylosis 07/11/2017   SI joint arthritis 07/11/2017   Lumbar degenerative disc disease 07/11/2017   Hyperlipidemia 03/26/2017   S/P placement of cardiac pacemaker 03/26/2017   Coronary artery disease 03/26/2017   Parkinsonian features 09/06/2016   Hypertension 09/06/2016   Spinal stenosis, lumbar region, 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/18/2016   Other  idiopathic scoliosis, lumbar region 08/18/2016   NSAID induced gastritis 05/26/2016   Chronic bilateral low back pain 05/15/2016   Spinal stenosis in cervical region 01/19/2014   Constipation 12/29/2013   Spinal stenosis of thoracolumbar region 12/27/2013   COPD (chronic obstructive pulmonary disease) ([place]) 03/47/4259   Chronic systolic heart failure ([place]) 12/17/2013   History of gastric ulcer 11/28/2013   Congestive dilated cardiomyopathy ([place]) 11/25/2013   Bladder cancer ([place]) 11/24/2013   LBBB (left bundle branch block) 11/24/2013   Atrial ectopy 11/24/2013   Hyponatremia 11/22/2013      Prior to Admission medications   Medication Sig Start Date End Date Taking? Authorizing Provider  dibucaine (NUPERCAINAL) 1 % OINT Place 1 application rectally as needed for hemorrhoids. 12/11/20  Yes Merlyn Lot, MD  acetaminophen (TYLENOL) 500 MG tablet Take 1,000 mg by mouth every 8 (eight) hours as needed for moderate pain.    [provider]  amitriptyline (ELAVIL) 10 MG tablet Take 1 tablet (10 mg total) by mouth at bedtime. 08/13/19   [name], MD  amLODipine (NORVASC) 2.5 MG tablet TAKE 1 TABLET(2.5 MG) BY MOUTH DAILY 10/13/20   Deboraha Sprang, MD  diltiazem 2 % GEL Apply 1 application topically 3 (three) times daily as needed (anal/rectal pain). 12/10/20   Hinda Kehr, MD  gabapentin (NEURONTIN) 300 MG capsule TAKE ONE CAPSULE BY MOUTH THREE TIMES DAILY AS NEEDED FOR BACK PAIN Patient taking differently: Take 300 mg by mouth 3 (three) times daily as needed (back pain). TAKE ONE CAPSULE BY MOUTH THREE TIMES DAILY AS NEEDED FOR BACK PAIN 08/11/20   [name], MD  Menthol, Topical Analgesic, (BIOFREEZE EX) Apply 1 application topically 4 (four) times daily as needed (back pain).    [provider]  metoprolol succinate (TOPROL-XL) 25 MG 24 hr tablet TAKE 1 TABLET(25 MG) BY MOUTH DAILY 10/13/20   Deboraha Sprang, MD  oxyCODONE-acetaminophen (PERCOCET) 10-325 MG  tablet Take 1 tablet by mouth every 4 (four) hours as needed for pain. 12/10/20 01/09/21  [name], MD  oxyCODONE-acetaminophen (PERCOCET) 10-325 MG tablet Take 1 tablet by mouth every 4 (four) hours as needed for pain. 01/09/21 02/08/21  [name], MD  polyethylene glycol (MIRALAX / GLYCOLAX) packet Take 17 g by mouth daily as needed for mild constipation.     [provider]  promethazine (PHENERGAN) 12.5 MG tablet TAKE 1 TABLET(12.5 MG) BY MOUTH EVERY 8 HOURS AS NEEDED FOR NAUSEA OR VOMITING Patient taking differently: Take 12.5 mg by mouth every 8 (eight) hours as needed for nausea or vomiting. 04/02/20   [name], MD  rosuvastatin (CRESTOR) 5 MG tablet TAKE 1 TABLET(5 MG) BY MOUTH DAILY Patient taking differently: Take 5 mg by mouth daily. 09/18/20   [name], MD  sucralfate (CARAFATE) 1 g tablet TAKE 1 TABLET BY MOUTH ONE HOUR BEFORE EACH MEAL  AND 2 HOURS AFTER OTHER MEDICATIONS Patient taking differently: Take 1 g by mouth See admin instructions. TAKE 1 TABLET BY MOUTH ONE HOUR BEFORE EACH MEAL AND 2 HOURS AFTER OTHER MEDICATIONS 03/11/20   [name], MD    Allergies Patient has no known allergies.    Social History Social History   Tobacco Use   Smoking status: Every Day    Packs/day: 0.50    Years: 56.00    Pack years: 28.00    Types: Cigarettes   Smokeless tobacco: Never   Tobacco comments:    02-11-2019  per pt currently smokes 10 cig's per day , started smoking age 39  Vaping Use   Vaping Use: Never used  Substance Use Topics   Alcohol use: No   Drug use: No    Review of Systems Patient denies headaches, rhinorrhea, blurry vision, numbness, shortness of breath, chest pain, edema, cough, abdominal pain, nausea, vomiting, diarrhea, dysuria, fevers, rashes or hallucinations unless otherwise stated above in HPI. ____________________________________________   PHYSICAL EXAM:  VITAL SIGNS: Vitals:   12/11/20 1710 12/11/20 1907  BP:  (!) 144/85 (!) 181/97  Pulse: (!) 101 66  Resp: 18   Temp:    SpO2: 99% 99%    Constitutional: Alert and oriented.  Eyes: Conjunctivae are normal.  Head: Atraumatic. Nose: No congestion/rhinnorhea. Mouth/Throat: Mucous membranes are moist.   Neck: No stridor. Painless ROM.  Cardiovascular: Normal rate, regular rhythm. Grossly normal heart sounds.  Good peripheral circulation. Respiratory: Normal respiratory effort.  No retractions. Lungs CTAB. Gastrointestinal: Soft and nontender. No distention. No abdominal bruits. No CVA tenderness. Genitourinary: Normal tone, no purulence no surrounding erythema.  No pain on DRE.  Sensation intact no saddle anesthesia.  No masses no crepitus Musculoskeletal: No lower extremity tenderness nor edema.  No joint effusions. Neurologic:  Normal speech and language. No gross focal neurologic deficits are appreciated. No facial droop Skin:  Skin is warm, dry and intact. No rash noted. Psychiatric: Mood and affect are normal. Speech and behavior are normal.  ____________________________________________   LABS (all labs ordered are listed, but only abnormal results are displayed)  Results for orders placed or performed during the hospital encounter of 12/11/20 (from the past 24 hour(s))  Urinalysis, Complete w Microscopic Urine, Clean Catch     Status: Abnormal   Collection Time: 12/11/20  6:19 PM  Result Value Ref Range   Color, Urine STRAW (A) YELLOW   APPearance CLEAR (A) CLEAR   Specific Gravity, Urine 1.004 (L) 1.005 - 1.030   pH 7.0 5.0 - 8.0   Glucose, UA NEGATIVE NEGATIVE mg/dL   Hgb urine dipstick SMALL (A) NEGATIVE   Bilirubin Urine NEGATIVE NEGATIVE   Ketones, ur NEGATIVE NEGATIVE mg/dL   Protein, ur NEGATIVE NEGATIVE mg/dL   Nitrite NEGATIVE NEGATIVE   Leukocytes,Ua NEGATIVE NEGATIVE   RBC / HPF 0-5 0 - 5 RBC/hpf   WBC, UA 0-5 0 - 5 WBC/hpf   Bacteria, UA NONE SEEN NONE SEEN   Squamous Epithelial / LPF 0-5 0 - 5    ____________________________________________ ____________________________________________   PROCEDURES  Procedure(s) performed:  Procedures    Critical Care performed: no ____________________________________________   INITIAL IMPRESSION / ASSESSMENT AND PLAN / ED COURSE  Pertinent labs & imaging results that were available during my care of the patient were reviewed by me and considered in my medical decision making (see chart for details).   DDX: Proctitis, proctalgia fugax, zoster, mass, spinal stenosis, cauda equina, UTI, bladder spasm  [name] is a 80 y.o. who presents to the ED with presentation as described above.  Patient nontoxic-appearing in no acute distress here with recurrent intermittent rectal pain.  Patient with extensive work-up done within past 48 hours CT imaging with no acute abnormality.  Patient without any change in symptoms just having trouble controlling the pain at home states that the oxycodone will control her pain for 2 hours but then it comes back.  On review of records she has been seen by GI for similar symptoms with no clear etiology.  She has not followed up with her pain doctor.  She denies any trouble walking no numbness or tingling.  No loss of bowel or bladder control.  This does not seem clinically consistent with cauda equina or spinal stenosis there is no injury no neuro exam findings to suggest such a process.  Review of record it does seem that her pain doctor is trying to avoid additional narcotic medication has recommended against increasing her narcotic dosing due to concern for hyperalgesia.  Her abdominal exam is otherwise soft and benign.  I do not feel that repeat CT imaging is clinically indicated.  Clinical Course as of 12/11/20 1921  Ludwig Clarks Dec 11, 2020  1800 Patient appears comfortable in no acute distress.  Her exam is reassuring.  Does not seem consistent with cauda equina worsening spinal stenosis.  No recent changes or  adjustments to her spinal stem.  She is not retaining urine.  Will provide urine sample. [PR]    Clinical Course User Index [PR] Merlyn Lot, MD   Her urine sample was without evidence of infection.  She does not have any flank pain or new back pain.  Discussed option for admission to the hospital for pain control but patient declining this stating that she would prefer to try managing her pain at home.  She states that she is very frustrated with her chronic pain but denies any SI or HI.  Will trial topical lidocaine.   The patient was evaluated in Emergency Department today for the symptoms described in the history of present illness. He/she was evaluated in the context of the global COVID-19 pandemic, which necessitated consideration that the patient might be at risk for infection with the SARS-CoV-2 virus that causes COVID-19. Institutional protocols and algorithms that pertain to the evaluation of patients at risk for COVID-19 are in a state of rapid change based on information released by regulatory bodies including the CDC and federal and state organizations. These policies and algorithms were followed during the patient's care in the ED.  As part of my medical decision making, I reviewed the following data within the [place] notes reviewed and incorporated, Labs reviewed, notes from prior ED visits and [place] Controlled Substance Database   ____________________________________________   FINAL CLINICAL IMPRESSION(S) / ED DIAGNOSES  Final diagnoses:  Rectal pain      NEW MEDICATIONS STARTED DURING THIS VISIT:  New Prescriptions   DIBUCAINE (NUPERCAINAL) 1 % OINT    Place 1 application rectally as needed for hemorrhoids.     Note:  This document was prepared using Dragon voice recognition software and may include unintentional dictation errors.    Merlyn Lot, MD 12/11/20 Dorthula Perfect

## 2020-12-11 NOTE — ED Notes (Signed)
Pt here via EMS from home with c/o anal pain worsening over the past 3 days. Was seen here for the same on Wednesday. Hx bladder cancer for 3 years. Has been referred to pain management and GI, here for pain management in the meantime. No bowel issues.  Bp 128/94; HR 76; RR 18; 96% RA. Sitting in wheelchair in lobby, daughter with pt, NAD.

## 2020-12-14 ENCOUNTER — Telehealth: Payer: Self-pay

## 2020-12-14 MED ORDER — PROMETHAZINE HCL 12.5 MG PO TABS: 12.5000 mg | ORAL_TABLET | Freq: Three times a day (TID) | ORAL | 2 refills | Status: DC | PRN

## 2020-12-14 NOTE — Telephone Encounter (Signed)
Please make patient a virtual appointment as soon as possible.

## 2020-12-14 NOTE — Telephone Encounter (Signed)
Pt is experiencing a number 10 pain, it's a new pain pt have been to the ER twice to find relief of pain was told to follow up with her pain dr

## 2020-12-15 ENCOUNTER — Encounter: Payer: Medicare Other | Admitting: Internal Medicine

## 2020-12-16 ENCOUNTER — Telehealth: Payer: Self-pay

## 2020-12-16 NOTE — Telephone Encounter (Signed)
Pt is in pain rated at level 9. She has been seen at the ED for this pain and was referred back to [place] to be seen for further evaluation. Daughter is asking for a call back with instructions on how to manage the pain. Please call Daughter [name]

## 2020-12-17 ENCOUNTER — Encounter: Payer: Self-pay | Admitting: Internal Medicine

## 2020-12-17 ENCOUNTER — Encounter: Payer: Self-pay | Admitting: Student in an Organized Health Care Education/Training Program

## 2020-12-17 ENCOUNTER — Ambulatory Visit (INDEPENDENT_AMBULATORY_CARE_PROVIDER_SITE_OTHER): Payer: Medicare Other

## 2020-12-17 ENCOUNTER — Telehealth: Payer: Self-pay

## 2020-12-17 DIAGNOSIS — I428 Other cardiomyopathies: Secondary | ICD-10-CM

## 2020-12-17 DIAGNOSIS — I5022 Chronic systolic (congestive) heart failure: Secondary | ICD-10-CM

## 2020-12-17 LAB — CUP PACEART REMOTE DEVICE CHECK
Battery Remaining Longevity: 6 mo
Battery Voltage: 2.83 V
Brady Statistic AP VP Percent: 45.91 %
Brady Statistic AP VS Percent: 0.52 %
Brady Statistic AS VP Percent: 52.72 %
Brady Statistic AS VS Percent: 0.85 %
Brady Statistic RA Percent Paced: 46.16 %
Brady Statistic RV Percent Paced: 97.51 %
Date Time Interrogation Session: 20220929090027
HighPow Impedance: 77 Ohm
Implantable Lead Implant Date: 20160217
Implantable Lead Implant Date: 20160217
Implantable Lead Implant Date: 20160217
Implantable Lead Location: 753858
Implantable Lead Location: 753859
Implantable Lead Location: 753860
Implantable Lead Model: 4298
Implantable Lead Model: 5076
Implantable Pulse Generator Implant Date: 20160217
Lead Channel Impedance Value: 418 Ohm
Lead Channel Impedance Value: 456 Ohm
Lead Channel Impedance Value: 456 Ohm
Lead Channel Impedance Value: 513 Ohm
Lead Channel Impedance Value: 532 Ohm
Lead Channel Impedance Value: 532 Ohm
Lead Channel Impedance Value: 532 Ohm
Lead Channel Impedance Value: 608 Ohm
Lead Channel Impedance Value: 760 Ohm
Lead Channel Impedance Value: 779 Ohm
Lead Channel Impedance Value: 874 Ohm
Lead Channel Impedance Value: 893 Ohm
Lead Channel Impedance Value: 931 Ohm
Lead Channel Pacing Threshold Amplitude: 0.5 V
Lead Channel Pacing Threshold Amplitude: 0.75 V
Lead Channel Pacing Threshold Amplitude: 1.375 V
Lead Channel Pacing Threshold Pulse Width: 0.4 ms
Lead Channel Pacing Threshold Pulse Width: 0.4 ms
Lead Channel Pacing Threshold Pulse Width: 0.4 ms
Lead Channel Sensing Intrinsic Amplitude: 2.25 mV
Lead Channel Sensing Intrinsic Amplitude: 2.25 mV
Lead Channel Sensing Intrinsic Amplitude: 24.25 mV
Lead Channel Sensing Intrinsic Amplitude: 24.25 mV
Lead Channel Setting Pacing Amplitude: 2 V
Lead Channel Setting Pacing Amplitude: 2.5 V
Lead Channel Setting Pacing Amplitude: 2.5 V
Lead Channel Setting Pacing Pulse Width: 0.4 ms
Lead Channel Setting Pacing Pulse Width: 0.4 ms
Lead Channel Setting Sensing Sensitivity: 0.45 mV

## 2020-12-17 NOTE — Telephone Encounter (Signed)
The patient needed help with her home remote monitor. I helped her send the manual transmission . Transmission received.

## 2020-12-17 NOTE — Telephone Encounter (Signed)
[employer] alert received, device has 6 months remaining until ERI.  Pt notified and monthly battery checks scheduled.  Pt has f/u visit scheduled with Dr. Caryl Comes on 03/16/21.

## 2020-12-17 NOTE — Telephone Encounter (Signed)
error 

## 2020-12-21 ENCOUNTER — Ambulatory Visit
Payer: Medicare Other | Attending: Student in an Organized Health Care Education/Training Program | Admitting: Student in an Organized Health Care Education/Training Program

## 2020-12-21 ENCOUNTER — Other Ambulatory Visit: Payer: Self-pay

## 2020-12-21 DIAGNOSIS — G894 Chronic pain syndrome: Secondary | ICD-10-CM

## 2020-12-21 NOTE — Progress Notes (Signed)
I attempted to call the patient and daugther [name] however no response. Voicemail left instructing patient to call front desk office at [phone] to reschedule appointment. -Dr Holley Raring

## 2020-12-23 ENCOUNTER — Ambulatory Visit
Payer: Medicare Other | Attending: Student in an Organized Health Care Education/Training Program | Admitting: Student in an Organized Health Care Education/Training Program

## 2020-12-23 ENCOUNTER — Other Ambulatory Visit: Payer: Self-pay

## 2020-12-23 DIAGNOSIS — D125 Benign neoplasm of sigmoid colon: Secondary | ICD-10-CM | POA: Diagnosis not present

## 2020-12-23 DIAGNOSIS — D122 Benign neoplasm of ascending colon: Secondary | ICD-10-CM | POA: Diagnosis not present

## 2020-12-23 DIAGNOSIS — R102 Pelvic and perineal pain: Secondary | ICD-10-CM | POA: Diagnosis not present

## 2020-12-23 DIAGNOSIS — G894 Chronic pain syndrome: Secondary | ICD-10-CM

## 2020-12-23 DIAGNOSIS — K649 Unspecified hemorrhoids: Secondary | ICD-10-CM | POA: Diagnosis not present

## 2020-12-23 DIAGNOSIS — D123 Benign neoplasm of transverse colon: Secondary | ICD-10-CM | POA: Diagnosis not present

## 2020-12-23 DIAGNOSIS — K5730 Diverticulosis of large intestine without perforation or abscess without bleeding: Secondary | ICD-10-CM | POA: Diagnosis not present

## 2020-12-23 NOTE — Progress Notes (Signed)
I attempted to call the patient and her daughter [name] x 2 however no response. Voicemail left instructing patient to call front desk office at [phone] to reschedule appointment. -Dr Holley Raring

## 2020-12-24 ENCOUNTER — Ambulatory Visit
Payer: Medicare Other | Attending: Student in an Organized Health Care Education/Training Program | Admitting: Student in an Organized Health Care Education/Training Program

## 2020-12-24 ENCOUNTER — Other Ambulatory Visit: Payer: Self-pay

## 2020-12-24 DIAGNOSIS — K6289 Other specified diseases of anus and rectum: Secondary | ICD-10-CM | POA: Diagnosis not present

## 2020-12-24 DIAGNOSIS — G894 Chronic pain syndrome: Secondary | ICD-10-CM

## 2020-12-24 DIAGNOSIS — M5416 Radiculopathy, lumbar region: Secondary | ICD-10-CM

## 2020-12-24 MED ORDER — GABAPENTIN 300 MG PO CAPS: 300.0000 mg | ORAL_CAPSULE | Freq: Three times a day (TID) | ORAL | 3 refills | Status: DC

## 2020-12-24 MED ORDER — AMITRIPTYLINE HCL 10 MG PO TABS: 10.0000 mg | ORAL_TABLET | Freq: Every day | ORAL | 2 refills | Status: DC

## 2020-12-24 NOTE — Progress Notes (Signed)
Patient: [name]  Service Category: E/M  Provider: [name], MD  DOB: [birth year]  DOS: 12/24/2020  Location: Office  MRN: [id number]  Setting: Ambulatory outpatient  Referring Provider: [name], MD  Type: Established Patient  Specialty: Interventional Pain Management  PCP: [name], MD  Location: Home  Delivery: TeleHealth     Virtual Encounter - Pain Management PROVIDER NOTE: Information contained herein reflects review and annotations entered in association with encounter. Interpretation of such information and data should be left to medically-trained personnel. Information provided to patient can be located elsewhere in the medical record under "Patient Instructions". Document created using STT-dictation technology, any transcriptional errors that may result from process are unintentional.    Contact & Pharmacy Preferred: [phone] Home: [phone] (home) Mobile: [phone] (mobile) E-mail: [id number] [name], [address] [place] [address] Phone: [phone] Fax: [phone]   Pre-screening  [name] offered "in-person" vs "virtual" encounter. She indicated preferring virtual for this encounter.   Reason COVID-19*  Social distancing based on CDC and AMA recommendations.   I contacted [name] on 12/24/2020 via telephone.      I clearly identified myself as [name], MD. I verified that I was speaking with the correct person using two identifiers (Name: [name], and date of birth: Jul 20, 1940).  Consent I sought verbal advanced consent from [name] for virtual visit interactions. I informed [name] of possible security and privacy concerns, risks, and limitations associated with providing "not-in-person" medical evaluation and management services. I also informed [name] of the availability of "in-person" appointments.  Finally, I informed her that there would be a charge for the virtual visit and that she could be  personally, fully or partially, financially responsible for it. [name] expressed understanding and agreed to proceed.   Historic Elements   [name] is a 80 y.o. year old, female patient evaluated today after our last contact on 12/23/2020. [name]  has a past medical history of AICD (automatic cardioverter/defibrillator) present ([place] cardiologist--- dr Caryl Comes ), Arthritis, Bladder cancer [facility]) (urologist-  dr Junious Silk), Chronic hyponatremia, Chronic low back pain with bilateral sciatica, Chronic systolic (congestive) heart failure ([place]), COPD with emphysema ([place]), Coronary artery disease (cardiologist-  dr Kathlyn [place]), DDD (degenerative disc disease), thoracolumbar, Frequent urination, Full dentures, Gait instability, GERD (gastroesophageal reflux disease), History of iron deficiency anemia (11/2013), History of stomach ulcers (11/2013), Hypertension, LBBB (left bundle branch block), NICM (nonischemic cardiomyopathy) ([place]) (last echo 12-31-2015 ef 55-60%), Nocturia more than twice per night, S/P insertion of spinal cord stimulator (followed by [facility]---- Dr Pecola Leisure (notes in care everywhere)), and Scoliosis. She also  has a past surgical history that includes Esophagogastroduodenoscopy (N/A, 11/28/2013); Transurethral resection of bladder tumor (N/A, 12/13/2013); Transurethral resection of bladder tumor (N/A, 01/17/2014); Cataract extraction w/ intraocular lens  implant, bilateral (Bilateral, right 12-2013 / left  02-2014); Cystoscopy with retrograde pyelogram, ureteroscopy and stent placement (Bilateral, 04/18/2014); Transurethral resection of bladder tumor (N/A, 04/18/2014); Lumbar disc surgery (1980's); bi-ventricular implantable cardioverter defibrillator (N/A, 05/07/2014); Cystoscopy w/ retrogrades (Bilateral, 01/06/2015); Cystoscopy with biopsy (N/A, 10/06/2015); Cardiac catheterization  (03-03-2014  dr Kathlyn [place]   [facility]); Transurethral resection of bladder (1995); Cardiovascular stress test (11/25/2013); transthoracic echocardiogram (12/31/2015   dr Caryl Comes); Cystoscopy with fulgeration (N/A, 06/30/2017); Spinal cord stimulator implant (10-09-2018   _1 ); Cystoscopy with biopsy (  N/A, 02/12/2019); Transurethral resection of bladder tumor (N/A, 12/13/2019); and Transurethral resection of bladder tumor (N/A, 10/06/2020). [name] has a current medication list which includes the following prescription(s): acetaminophen, amitriptyline, amlodipine, dibucaine, diltiazem, gabapentin, menthol (topical analgesic), metoprolol succinate, oxycodone-acetaminophen, [START ON 01/09/2021] oxycodone-acetaminophen, polyethylene glycol, promethazine, rosuvastatin, and sucralfate, and the following Facility-Administered Medications: epirubicin (ELLENCE) 50 mg in sodium chloride 0.9 % bladder instillation, gemcitabine, and gemcitabine. She  reports that she has been smoking cigarettes. She has a 28.00 pack-year smoking history. She has never used smokeless tobacco. She reports that she does not drink alcohol and does not use drugs. [name] has No Known Allergies.   HPI  Today, she is being contacted for new problems.  Spoke with [name], Ms. Akers's daughter (primary caregiver, after consent from patient) at length today regarding new onset rectal pain.  Per patient and daughter, the pain is very severe, she has had 2 visits to the emergency department on 12/10/2020 as well as 12/11/2020 for severe rectal pain.  There is an extensive GI work-up was done which was negative for any remarkable pathology.  She is having difficulty managing her pain.  She has attempted to contact [facility], spinal cord stimulation to see if modification of her spinal cord stimulator settings would have an impact.  She is having trouble getting comfortable.  She was prescribed a rectal gel by urology which is somewhat helpful.  Her daughter is  wondering what the neck steps are.  Pharmacotherapy Assessment   Analgesic: Percocet 10 mg up to 5 times a day as needed for severe breakthrough pain, quantity 150/month; MME equals 75   Monitoring: [place] PMP: PDMP reviewed during this encounter.       Pharmacotherapy: No side-effects or adverse reactions reported. Compliance: No problems identified. Effectiveness: Clinically acceptable. Plan: Refer to "POC". UDS:  Summary  Date Value Ref Range Status  08/11/2020 Note  Final    Comment:    ==================================================================== [employer] Select 13 (MW) ==================================================================== Test                             Result       Flag       Units  Drug Present and Declared for Prescription Verification   Oxycodone                      4370         EXPECTED   ng/mg creat   Oxymorphone                    8753         EXPECTED   ng/mg creat   Noroxycodone                   12500        EXPECTED   ng/mg creat   Noroxymorphone                 2957         EXPECTED   ng/mg creat    Sources of oxycodone are scheduled prescription medications.    Oxymorphone, noroxycodone, and noroxymorphone are expected    metabolites of oxycodone. Oxymorphone is also available as a    scheduled prescription medication.  ==================================================================== Test                      Result    Flag  Units      Ref Range   Creatinine              30               mg/dL      >=20 ==================================================================== Declared Medications:  The flagging and interpretation on this report are based on the  following declared medications.  Unexpected results may arise from  inaccuracies in the declared medications.   **Note: The testing scope of this panel includes these medications:   Oxycodone (Percocet)   **Note: The testing scope of this panel does not include the   following reported medications:   Acetaminophen  Acetaminophen (Percocet)  Amitriptyline  Amlodipine (Norvasc)  Gabapentin  Losartan  Menthol  Metoprolol  Polyethylene Glycol (MiraLAX)  Promethazine  Rosuvastatin  Sucralfate (Carafate) ==================================================================== For clinical consultation, please call [phone]. ====================================================================      Laboratory Chemistry Profile   Renal Lab Results  Component Value Date   BUN 11 12/09/2020   CREATININE 0.53 12/09/2020   BCR 18 04/28/2014   GFR 89.19 08/27/2019   GFRAA >60 06/17/2019   GFRNONAA >60 12/09/2020    Hepatic Lab Results  Component Value Date   AST 33 12/09/2020   ALT 25 12/09/2020   ALBUMIN 4.6 12/09/2020   ALKPHOS 78 12/09/2020   LIPASE 30 11/23/2013    Electrolytes Lab Results  Component Value Date   NA 132 (L) 12/09/2020   K 3.6 12/09/2020   CL 98 12/09/2020   CALCIUM 9.8 12/09/2020   MG 2.3 06/03/2019    Bone Lab Results  Component Value Date   VD25OH 36.17 01/11/2019    Inflammation (CRP: Acute Phase) (ESR: Chronic Phase) No results found for: CRP, ESRSEDRATE, LATICACIDVEN       Note: Above Lab results reviewed.   Assessment  The primary encounter diagnosis was Pain, rectal. Diagnoses of Chronic pain syndrome and Lumbar radiculopathy were also pertinent to this visit.  Plan of Care   Ms. VONNE MCDANEL has a current medication list which includes the following long-term medication(s): amitriptyline, amlodipine, diltiazem, gabapentin, metoprolol succinate, promethazine, rosuvastatin, and sucralfate.   I had an extensive discussion today with the patient's daughter regarding best treatment plan.  After such an extensive work-up from a GI standpoint, it is my opinion that the majority of her pain could be coming from high-dose opioid therapy.  I have counseled the patient on this in the past on many  occasions and have discussed with her the concept of central sensitization along with opioid-induced hyperalgesia.  I have always recommended that she try a drug holiday to decrease her tolerance however she has been resistant to this.  I also had reservations about spinal cord stimulation however the patient insisted on having this done and she did [facility].  At this point, my recommendation is to wean her opioid medications.  I believe they could be a culprit of her abdominal dysfunction and new onset rectal pain.  I recommended that she decrease her oxycodone to every 6 hours for the next week then decrease to every 8 hours for the week after and then remain at every 12 hours as needed for 2 to 3 weeks.  At that time she can increase back to every 8 hours as needed and if she needs to increase further to every 6 hours that is also possible.  I was very clear with the patient as well as her daughter that I do not recommend  any opioid escalation and in fact recommend opioid weaning as we have discussed in the past.  I encouraged her to seek a second opinion if she desires.  As we attempt to wean her oxycodone, I recommend increasing her gabapentin and amitriptyline as below.  These are good adjuncts for pain management and I informed the patient about the risk and benefit.  Pharmacotherapy (Medications Ordered): Meds ordered this encounter  Medications   amitriptyline (ELAVIL) 10 MG tablet    Sig: Take 1-2 tablets (10-20 mg total) by mouth at bedtime.    Dispense:  60 tablet    Refill:  2   gabapentin (NEURONTIN) 300 MG capsule    Sig: Take 1-2 capsules (300-600 mg total) by mouth 3 (three) times daily. TAKE ONE CAPSULE BY MOUTH THREE TIMES DAILY AS NEEDED FOR BACK PAIN    Dispense:  90 capsule    Refill:  3     Follow-up plan:   Return for Medication Management.    Recent Visits Date Type Provider Dept  12/23/20 Office Visit [name], MD [facility]  12/21/20 Office  Visit [name], MD [facility]  11/10/20 Office Visit [name], MD [facility]  Showing recent visits within past 90 days and meeting all other requirements Today's Visits Date Type Provider Dept  12/24/20 Office Visit [name], MD [facility]  Showing today's visits and meeting all other requirements Future Appointments Date Type Provider Dept  02/04/21 Appointment [name], MD [facility]  Showing future appointments within next 90 days and meeting all other requirements I discussed the assessment and treatment plan with the patient. The patient was provided an opportunity to ask questions and all were answered. The patient agreed with the plan and demonstrated an understanding of the instructions.  Patient advised to call back or seek an in-person evaluation if the symptoms or condition worsens.  Duration of encounter: 41mnutes.  Note by: BGillis Santa MD Date: 12/24/2020; Time: 1:23 PM

## 2020-12-25 DIAGNOSIS — D123 Benign neoplasm of transverse colon: Secondary | ICD-10-CM | POA: Diagnosis not present

## 2020-12-25 DIAGNOSIS — D122 Benign neoplasm of ascending colon: Secondary | ICD-10-CM | POA: Diagnosis not present

## 2020-12-25 DIAGNOSIS — D125 Benign neoplasm of sigmoid colon: Secondary | ICD-10-CM | POA: Diagnosis not present

## 2020-12-25 NOTE — Progress Notes (Signed)
Remote ICD transmission.   

## 2020-12-29 ENCOUNTER — Ambulatory Visit: Payer: Medicare Other

## 2020-12-30 DIAGNOSIS — C678 Malignant neoplasm of overlapping sites of bladder: Secondary | ICD-10-CM | POA: Diagnosis not present

## 2021-01-20 ENCOUNTER — Other Ambulatory Visit: Payer: Self-pay | Admitting: Urology

## 2021-01-21 ENCOUNTER — Other Ambulatory Visit: Payer: Self-pay

## 2021-01-21 DIAGNOSIS — I420 Dilated cardiomyopathy: Secondary | ICD-10-CM

## 2021-01-21 MED ORDER — METOPROLOL SUCCINATE ER 25 MG PO TB24
ORAL_TABLET | ORAL | 0 refills | Status: DC
Start: 2021-01-21 — End: 2021-02-25

## 2021-01-25 ENCOUNTER — Ambulatory Visit: Payer: Medicare Other

## 2021-01-26 ENCOUNTER — Other Ambulatory Visit: Payer: Self-pay

## 2021-01-26 MED ORDER — AMLODIPINE BESYLATE 2.5 MG PO TABS: ORAL_TABLET | ORAL | 0 refills | Status: DC

## 2021-02-02 ENCOUNTER — Ambulatory Visit (INDEPENDENT_AMBULATORY_CARE_PROVIDER_SITE_OTHER): Payer: Medicare Other

## 2021-02-02 ENCOUNTER — Other Ambulatory Visit: Payer: Self-pay

## 2021-02-02 DIAGNOSIS — Z23 Encounter for immunization: Secondary | ICD-10-CM | POA: Diagnosis not present

## 2021-02-04 ENCOUNTER — Encounter: Payer: Self-pay | Admitting: Student in an Organized Health Care Education/Training Program

## 2021-02-04 ENCOUNTER — Ambulatory Visit
Payer: Medicare Other | Attending: Student in an Organized Health Care Education/Training Program | Admitting: Student in an Organized Health Care Education/Training Program

## 2021-02-04 ENCOUNTER — Other Ambulatory Visit: Payer: Self-pay

## 2021-02-04 VITALS — BP 157/90 | HR 75 | Temp 97.2°F | Resp 18 | Ht 68.0 in | Wt 120.0 lb

## 2021-02-04 DIAGNOSIS — M5136 Other intervertebral disc degeneration, lumbar region: Secondary | ICD-10-CM | POA: Diagnosis not present

## 2021-02-04 DIAGNOSIS — Z9689 Presence of other specified functional implants: Secondary | ICD-10-CM | POA: Insufficient documentation

## 2021-02-04 DIAGNOSIS — M5416 Radiculopathy, lumbar region: Secondary | ICD-10-CM | POA: Insufficient documentation

## 2021-02-04 DIAGNOSIS — G894 Chronic pain syndrome: Secondary | ICD-10-CM | POA: Insufficient documentation

## 2021-02-04 DIAGNOSIS — M48062 Spinal stenosis, lumbar region with neurogenic claudication: Secondary | ICD-10-CM | POA: Insufficient documentation

## 2021-02-04 DIAGNOSIS — K6289 Other specified diseases of anus and rectum: Secondary | ICD-10-CM | POA: Diagnosis not present

## 2021-02-04 DIAGNOSIS — M47818 Spondylosis without myelopathy or radiculopathy, sacral and sacrococcygeal region: Secondary | ICD-10-CM | POA: Insufficient documentation

## 2021-02-04 DIAGNOSIS — M47816 Spondylosis without myelopathy or radiculopathy, lumbar region: Secondary | ICD-10-CM | POA: Diagnosis not present

## 2021-02-04 MED ORDER — AMITRIPTYLINE HCL 10 MG PO TABS: 20.0000 mg | ORAL_TABLET | Freq: Every day | ORAL | 3 refills | Status: DC

## 2021-02-04 MED ORDER — OXYCODONE-ACETAMINOPHEN 10-325 MG PO TABS: 1.0000 | ORAL_TABLET | ORAL | 0 refills | Status: AC | PRN

## 2021-02-04 MED ORDER — OXYCODONE-ACETAMINOPHEN 10-325 MG PO TABS: 1.0000 | ORAL_TABLET | ORAL | 0 refills | Status: DC | PRN

## 2021-02-04 MED ORDER — GABAPENTIN 300 MG PO CAPS: 300.0000 mg | ORAL_CAPSULE | Freq: Three times a day (TID) | ORAL | 3 refills | Status: DC

## 2021-02-04 NOTE — Progress Notes (Signed)
Safety precautions to be maintained throughout the outpatient stay will include: orient to surroundings, keep bed in low position, maintain call bell within reach at all times, provide assistance with transfer out of bed and ambulation.    Nursing Pain Medication Assessment:  Safety precautions to be maintained throughout the outpatient stay will include: orient to surroundings, keep bed in low position, maintain call bell within reach at all times, provide assistance with transfer out of bed and ambulation.  Medication Inspection Compliance: Pill count conducted under aseptic conditions, in front of the patient. Neither the pills nor the bottle was removed from the patient's sight at any time. Once count was completed pills were immediately returned to the patient in their original bottle.  Medication: Oxycodone/APAP Pill/Patch Count:  29 of 150 pills remain Pill/Patch Appearance: Markings consistent with prescribed medication Bottle Appearance: Standard pharmacy container. Clearly labeled. Filled Date: 3 / 22 / 2022 Last Medication intake:  Today

## 2021-02-04 NOTE — Progress Notes (Signed)
PROVIDER NOTE: Information contained herein reflects review and annotations entered in association with encounter. Interpretation of such information and data should be left to medically-trained personnel. Information provided to patient can be located elsewhere in the medical record under "Patient Instructions". Document created using STT-dictation technology, any transcriptional errors that may result from process are unintentional.    Patient: [name]  Service Category: E/M  Provider: [name], MD  DOB: [birth year]  DOS: 02/04/2021  Specialty: Interventional Pain Management  MRN: [id number]  Setting: Ambulatory outpatient  PCP: [name], MD  Type: Established Patient    Referring Provider: [name], MD  Location: Office  Delivery: Face-to-face     HPI  [name], a 80 y.o. year old female, is here today because of her Pain, rectal [K62.89]. Ms. Mazzeo primary complain today is Back Pain Last encounter: My last encounter with her was on 08/11/20 Pertinent problems: Ms. Postel has Spinal stenosis of thoracolumbar region; Spinal stenosis in cervical region; Spinal stenosis, lumbar region, with neurogenic claudication; Lumbar spondylosis; SI joint arthritis; Lumbar degenerative disc disease; Chronic pain syndrome; Lumbar radiculopathy; Lumbar facet arthropathy; and Back pain on their pertinent problem list. Pain Assessment: Severity of Chronic pain is reported as a 7 /10. Location: Back Lower, Right, Left/Radiates from lower back into buttocks bilateral. Onset: More than a month ago. Quality: Constant, Aching, Dull, Discomfort. Timing: Constant. Modifying factor(s): Medications and laying down. Vitals:  height is 5' 8"  (1.727 m) and weight is 120 lb (54.4 kg). Her temporal temperature is 97.2 F (36.2 C) (abnormal). Her blood pressure is 157/90 (abnormal) and her pulse is 75. Her respiration is 18 and oxygen saturation is 97%.   Reason for encounter: medication management.     Rectal pain improved that was flaring up last visit. Patient continues multimodal pain regimen as prescribed.  States that it provides pain relief and improvement in functional status. Is noticing benefit with increased amitriptyline and gabapentin dose.  Continues with spinal cord stimulation.  Pharmacotherapy Assessment  Analgesic: Percocet 10 mg up to 5 times a day as needed for severe breakthrough pain, quantity 150/month; MME equals 75   Monitoring: [place] PMP: PDMP reviewed during this encounter.       Pharmacotherapy: No side-effects or adverse reactions reported. Compliance: No problems identified. Effectiveness: Clinically acceptable.  UDS:  Summary  Date Value Ref Range Status  08/11/2020 Note  Final    Comment:    ==================================================================== [employer] [facility] (MW) ==================================================================== Test                             Result       Flag       Units  Drug Present and Declared for Prescription Verification   Oxycodone                      4370         EXPECTED   ng/mg creat   Oxymorphone                    8753         EXPECTED   ng/mg creat   Noroxycodone                   12500        EXPECTED   ng/mg creat   Noroxymorphone  2957         EXPECTED   ng/mg creat    Sources of oxycodone are scheduled prescription medications.    Oxymorphone, noroxycodone, and noroxymorphone are expected    metabolites of oxycodone. Oxymorphone is also available as a    scheduled prescription medication.  ==================================================================== Test                      Result    Flag   Units      Ref Range   Creatinine              30               mg/dL      >=20 ==================================================================== Declared Medications:  The flagging and interpretation on this report are based on the  following declared medications.   Unexpected results may arise from  inaccuracies in the declared medications.   **Note: The testing scope of this panel includes these medications:   Oxycodone (Percocet)   **Note: The testing scope of this panel does not include the  following reported medications:   Acetaminophen  Acetaminophen (Percocet)  Amitriptyline  Amlodipine (Norvasc)  Gabapentin  Losartan  Menthol  Metoprolol  Polyethylene Glycol (MiraLAX)  Promethazine  Rosuvastatin  Sucralfate (Carafate) ==================================================================== For clinical consultation, please call [phone]. ====================================================================        ROS  Constitutional: Denies any fever or chills Gastrointestinal: No reported hemesis, hematochezia, vomiting, or acute GI distress Musculoskeletal:  +LBP, b/l leg pain Neurological: No reported episodes of acute onset apraxia, aphasia, dysarthria, agnosia, amnesia, paralysis, loss of coordination, or loss of consciousness  Medication Review  Menthol (Topical Analgesic), acetaminophen, amLODipine, amitriptyline, dibucaine, diltiazem, gabapentin, losartan, metoprolol succinate, oxyCODONE-acetaminophen, pantoprazole, polyethylene glycol, promethazine, rosuvastatin, and sucralfate  History Review  Allergy: Ms. Mccabe has No Known Allergies. Drug: Ms. Pietila  reports no history of drug use. Alcohol:  reports no history of alcohol use. Tobacco:  reports that she has been smoking cigarettes. She has a 28.00 pack-year smoking history. She has never used smokeless tobacco. Social: Ms. Grall  reports that she has been smoking cigarettes. She has a 28.00 pack-year smoking history. She has never used smokeless tobacco. She reports that she does not drink alcohol and does not use drugs. Medical:  has a past medical history of AICD (automatic cardioverter/defibrillator) present ([place] cardiologist--- dr Caryl Comes ), Arthritis, Bladder  cancer [facility]) (urologist-  dr Junious Silk), Chronic hyponatremia, Chronic low back pain with bilateral sciatica, Chronic systolic (congestive) heart failure ([place]), COPD with emphysema ([place]), Coronary artery disease (cardiologist-  dr Kathlyn [place]), DDD (degenerative disc disease), thoracolumbar, Frequent urination, Full dentures, Gait instability, GERD (gastroesophageal reflux disease), History of iron deficiency anemia (11/2013), History of stomach ulcers (11/2013), Hypertension, LBBB (left bundle branch block), NICM (nonischemic cardiomyopathy) ([place]) (last echo 12-31-2015 ef 55-60%), Nocturia more than twice per night, S/P insertion of spinal cord stimulator (followed by [facility]---- Dr Pecola Leisure (notes in care everywhere)), and Scoliosis. Surgical: Ms. Barbian  has a past surgical history that includes Esophagogastroduodenoscopy (N/A, 11/28/2013); Transurethral resection of bladder tumor (N/A, 12/13/2013); Transurethral resection of bladder tumor (N/A, 01/17/2014); Cataract extraction w/ intraocular lens  implant, bilateral (Bilateral, right 12-2013 / left  02-2014); Cystoscopy with retrograde pyelogram, ureteroscopy and stent placement (Bilateral, 04/18/2014); Transurethral resection of bladder tumor (N/A, 04/18/2014); Lumbar disc surgery (1980's); bi-ventricular implantable cardioverter defibrillator (N/A, 05/07/2014); Cystoscopy w/ retrogrades (Bilateral, 01/06/2015); Cystoscopy with biopsy (N/A, 10/06/2015); Cardiac catheterization (03-03-2014  dr  Kathlyn Sacramento   [facility]); Transurethral resection of bladder (1995); Cardiovascular stress test (11/25/2013); transthoracic echocardiogram (12/31/2015   dr Caryl Comes); Cystoscopy with fulgeration (N/A, 06/30/2017); Spinal cord stimulator implant (10-09-2018   @[facility] ); Cystoscopy with biopsy (N/A, 02/12/2019); Transurethral resection of bladder tumor (N/A, 12/13/2019); and Transurethral resection of bladder tumor (N/A, 10/06/2020). Family: family history includes Cancer in her  brother and mother.  Laboratory Chemistry Profile   Renal Lab Results  Component Value Date   BUN 11 12/09/2020   CREATININE 0.53 12/09/2020   BCR 18 04/28/2014   GFR 89.19 08/27/2019   GFRAA >60 06/17/2019   GFRNONAA >60 12/09/2020     Hepatic Lab Results  Component Value Date   AST 33 12/09/2020   ALT 25 12/09/2020   ALBUMIN 4.6 12/09/2020   ALKPHOS 78 12/09/2020   LIPASE 30 11/23/2013     Electrolytes Lab Results  Component Value Date   NA 132 (L) 12/09/2020   K 3.6 12/09/2020   CL 98 12/09/2020   CALCIUM 9.8 12/09/2020   MG 2.3 06/03/2019     Bone Lab Results  Component Value Date   VD25OH 36.17 01/11/2019     Inflammation (CRP: Acute Phase) (ESR: Chronic Phase) No results found for: CRP, ESRSEDRATE, LATICACIDVEN     Note: Above Lab results reviewed.   Physical Exam  General appearance: Well nourished, well developed, and well hydrated. In no apparent acute distress Mental status: Alert, oriented x 3 (person, place, & time)       Respiratory: No evidence of acute respiratory distress Eyes: PERLA Vitals: BP (!) 157/90   Pulse 75   Temp (!) 97.2 F (36.2 C) (Temporal)   Resp 18   Ht 5' 8"  (1.727 m)   Wt 120 lb (54.4 kg)   SpO2 97%   BMI 18.25 kg/m  BMI: Estimated body mass index is 18.25 kg/m as calculated from the following:   Height as of this encounter: 5' 8"  (1.727 m).   Weight as of this encounter: 120 lb (54.4 kg). Ideal: Ideal body weight: 63.9 kg (140 lb 14 oz)    Lumbar Exam  Skin & Axial Inspection: Well healed scar from previous spine surgery detected, IPG for SCS in place Alignment: Symmetrical Functional ROM: Pain restricted ROM       Stability: No instability detected Muscle Tone/Strength: Functionally intact. No obvious neuro-muscular anomalies detected. Sensory (Neurological): Dermatomal pain pattern Palpation: No palpable anomalies           Gait & Posture Assessment  Ambulation: Patient ambulates using a walker Gait:  Limited. Using assistive device to ambulate Posture: Difficulty standing up straight, due to pain    Lower Extremity Exam      Side: Right lower extremity   Side: Left lower extremity  Stability: No instability observed           Stability: No instability observed          Skin & Extremity Inspection: Skin color, temperature, and hair growth are WNL. No peripheral edema or cyanosis. No masses, redness, swelling, asymmetry, or associated skin lesions. No contractures.   Skin & Extremity Inspection: Skin color, temperature, and hair growth are WNL. No peripheral edema or cyanosis. No masses, redness, swelling, asymmetry, or associated skin lesions. No contractures.  Functional ROM: Pain restricted ROM for all joints of the lower extremity           Functional ROM: Pain restricted ROM for all joints of the lower extremity  Muscle Tone/Strength: Functionally intact. No obvious neuro-muscular anomalies detected.   Muscle Tone/Strength: Functionally intact. No obvious neuro-muscular anomalies detected.  Sensory (Neurological): Dermatomal pain pattern         Sensory (Neurological): Dermatomal pain pattern        DTR: Patellar: deferred today Achilles: deferred today Plantar: deferred today   DTR: Patellar: deferred today Achilles: deferred today Plantar: deferred today  Palpation: No palpable anomalies   Palpation: No palpable anomalies       Assessment   Status Diagnosis  Persistent Persistent Persistent 1. Pain, rectal   2. Lumbar radiculopathy   3. Lumbar degenerative disc disease   4. Spinal stenosis, lumbar region, with neurogenic claudication   5. Lumbar facet arthropathy   6. Spinal cord stimulator status   7. SI joint arthritis   8. Chronic pain syndrome        Plan of Care   Ms. ALYZAH PELLY has a current medication list which includes the following long-term medication(s): amlodipine, diltiazem, metoprolol succinate, promethazine, rosuvastatin, sucralfate,  amitriptyline, and gabapentin.  Pharmacotherapy (Medications Ordered): Meds ordered this encounter  Medications   oxyCODONE-acetaminophen (PERCOCET) 10-325 MG tablet    Sig: Take 1 tablet by mouth every 4 (four) hours as needed for pain.    Dispense:  150 tablet    Refill:  0   oxyCODONE-acetaminophen (PERCOCET) 10-325 MG tablet    Sig: Take 1 tablet by mouth every 4 (four) hours as needed for pain.    Dispense:  150 tablet    Refill:  0   oxyCODONE-acetaminophen (PERCOCET) 10-325 MG tablet    Sig: Take 1 tablet by mouth every 4 (four) hours as needed for pain.    Dispense:  150 tablet    Refill:  0   amitriptyline (ELAVIL) 10 MG tablet    Sig: Take 2 tablets (20 mg total) by mouth at bedtime.    Dispense:  60 tablet    Refill:  3   gabapentin (NEURONTIN) 300 MG capsule    Sig: Take 1-2 capsules (300-600 mg total) by mouth 3 (three) times daily.    Dispense:  90 capsule    Refill:  3  Continue amitriptyline 20 mg nightly, gabapentin 300-600 mg 3 times daily. Continue with spinal cord stimulation.  Follow-up plan:   Return in about 3 months (around 05/07/2021).   Recent Visits Date Type Provider Dept  12/24/20 Office Visit [name], MD [facility]  12/23/20 Office Visit [name], MD [facility]  12/21/20 Office Visit [name], MD [facility]  11/10/20 Office Visit [name], MD [facility]  Showing recent visits within past 90 days and meeting all other requirements Today's Visits Date Type Provider Dept  02/04/21 Office Visit [name], MD [facility]  Showing today's visits and meeting all other requirements Future Appointments No visits were found meeting these conditions. Showing future appointments within next 90 days and meeting all other requirements I discussed the assessment and treatment plan with the patient. The patient was provided an opportunity to ask questions and all were answered. The  patient agreed with the plan and demonstrated an understanding of the instructions.  Patient advised to call back or seek an in-person evaluation if the symptoms or condition worsens.  Duration of encounter: 30 minutes.  Note by: [name], MD Date: 02/04/2021; Time: 1:32 PM

## 2021-02-18 ENCOUNTER — Other Ambulatory Visit: Payer: Self-pay

## 2021-02-18 ENCOUNTER — Encounter (HOSPITAL_BASED_OUTPATIENT_CLINIC_OR_DEPARTMENT_OTHER): Payer: Self-pay | Admitting: Urology

## 2021-02-18 DIAGNOSIS — Z9989 Dependence on other enabling machines and devices: Secondary | ICD-10-CM

## 2021-02-18 HISTORY — DX: Dependence on other enabling machines and devices: Z99.89

## 2021-02-18 NOTE — Progress Notes (Addendum)
Spoke w/ via phone for pre-op interview---pt Lab needs dos----  I stat, ekg             Lab results------no current ekg COVID test -----patient states asymptomatic no test needed Arrive at -------945 am 02-23-2021 NPO after MN NO Solid Food.  Clear liquids from MN until---845 am Med rec completed Medications to take morning of surgery -----amlodipine, gabapentin, metoprolol succinate, oxycodone Diabetic medication -----n/a Patient instructed no nail polish to be worn day of surgery Patient instructed to bring photo id and insurance card day of surgery Patient aware to have Driver (ride ) / caregiver    for 24 hours after surgery  driver dos: caregiver Lavinia Sharps cell [phone] Patient Special Instructions -----no smoking 24 hours before surgery Pre-Op special Istructions ----- Patient verbalized understanding of instructions that were given at this phone interview. Patient denies shortness of breath, chest pain, fever, cough at this phone interview.   Anesthesia Review:AICD 2-16-with ef 25 %, current ef 60-65 % per echo,nonobstructive cad, chronic CHF copd/emphysema (pt still smokes) never used oxygen, chronic low back pain s/p spinal cord stimulator placement 10-09-2018 (pt to bring remote day of surgery). Pt denies any cardiac/stroke  s & s other than sob  with exertionwhich is chronic for her.  Uses walker all the time  PCP: dr Helene Kelp Derrel Nip [facility]  09-20-2019 Epic)  Cardiologist :dr Rogue Jury [facility] 11-26-2019 epic) EP: dr Caryl Comes [facility] 08-26-2020 epic) Last device check note 12-17-2020 has 6 month battery life has f/u visit scheduled 03-16-2021 with dr Caryl Comes   Pain Management  dr b. Rudi Coco 05-07-2020 epic) [facility]: dr b Nancie Neas [facility] 08-03-2020 care everywhere)   Chest x-ray : 05-08-2014 epic EKG : 11-26-2019 epic Echo : 08-30-2020 Device check note 12-17-2020 epic Stress test:11-26-2013 epic Cardiac Cath : 02-21-2014 epic Activity level: ambulates all the time with walker due to  gait instability, has caregiver at home, does some household chores

## 2021-02-22 MED ORDER — SODIUM CHLORIDE (PF) 0.9 % IJ SOLN
80.0000 mg | Freq: Once | INTRAVENOUS | Status: DC
  Filled 2021-02-22: qty 40

## 2021-02-22 NOTE — Progress Notes (Addendum)
Sent second request for device orders to [facility] in epic thru inbox message since pt's surgery is tomorrow 02-23-2021.  ADDENDUM:  Received secure chat from device clinic nurse, Trish Fountain RN.  Stated that device order declined because pt has not been in office for cardiology visit in over a year ,lov in epic 11-26-2019 with Dr Mertie Clause. Last remote device check 12-17-2020 was abnormal for battery approaching ERI.  Pt cancelled office visit for 12-15-2020.  Pt rescheduled 03-16-2021 for office visit.   Reviewed with anesthesia, Dr Ambrose Pancoast MDA, stated pt will need cardiac clearance with device orders prior to surgery.   Called and spoke w/ Coni, [place] scheduler for Dr Junious Silk, informed about cardiac clearance needed.

## 2021-02-23 ENCOUNTER — Ambulatory Visit (HOSPITAL_BASED_OUTPATIENT_CLINIC_OR_DEPARTMENT_OTHER): Admission: RE | Admit: 2021-02-23 | Payer: Medicare Other | Source: Home / Self Care | Admitting: Urology

## 2021-02-23 ENCOUNTER — Other Ambulatory Visit: Payer: Self-pay | Admitting: Internal Medicine

## 2021-02-23 HISTORY — DX: Presence of spectacles and contact lenses: Z97.3

## 2021-02-23 SURGERY — TURBT (TRANSURETHRAL RESECTION OF BLADDER TUMOR)
Anesthesia: General

## 2021-02-24 NOTE — Progress Notes (Signed)
Electrophysiology Office Note Date: 02/24/2021  ID:  [name], DOB 06/28/1940, MRN [id number]  PCP: [name], MD Primary Cardiologist: None Electrophysiologist: Bianca Axe, MD   CC: Routine ICD follow-up  [name] is a 80 y.o. female seen today for Bianca Axe, MD for cardiac clearance.  Since last being seen in our clinic the patient reports doing well overall. She requires further procedure to her bladder, which she presented for, but was then cancelled as device clearance could not be given due to long follow up.  she denies chest pain, palpitations, dyspnea, PND, orthopnea, nausea, vomiting, dizziness, syncope, edema, weight gain, or early satiety. She has not had ICD shocks.   Device History: Medtronic BiV ICD implanted 2016 for CHF   Past Medical History:  Diagnosis Date   AICD (automatic cardioverter/defibrillator) present EP cardiologist--- dr [name]    placement 05-07-2014 , ef 25%,  NICM/  (02-11-2019 last echo 08-31-2018 ef 60-65%)   Arthritis    "in about all my joints; for sure in my back"   Bladder cancer [facility]) urologist-  dr [name]   dx 1995--  recurrent bladder cancer 2015 , s/p TURBT's and chemo instillation's ;   04/ 2019  s/p TURBT   Chronic hyponatremia    Chronic low back pain with bilateral sciatica    s/p  spinal cord stimulator @ [facility]  [phone]   Chronic systolic (congestive) heart failure [facility])    cardiologist-  dr Rogue Jury   COPD with emphysema ([place])    (02-11-2019  per pt has never been on oxygen)   Coronary artery disease cardiologist-  dr Kathlyn [place]   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% per cardiac cath 03-03-2014   DDD (degenerative disc disease), thoracolumbar    Frequent urination    Full dentures    Gait instability    due to chronic low back pain, uses roller walker   GERD (gastroesophageal reflux disease)    History of iron deficiency anemia 11/2013   resolved w/ IV Iron  (02-11-2019  per pt has not had any issues  since 2015)   History of stomach ulcers 11/2013   Hypertension    LBBB (left bundle branch block)    NICM (nonischemic cardiomyopathy) ([place]) last echo 12-31-2015 ef 55-60%   dx 09/ 2015 per echo 20%;  myoview 09/ 2015 ef 28%;  per cardiac cath 12/ 2015 ef 30%;     Nocturia more than twice per night    S/P insertion of spinal cord stimulator followed by [facility]---- Dr [name] (notes in care everywhere)   10-09-2018  @[facility] --- thoracic spinal cord stimular/ generator  (device from [facility])---- per pt has a control   Scoliosis    Uses walker 02/18/2021   all the time   Wears glasses    for reading   Past Surgical History:  Procedure Laterality Date   BI-VENTRICULAR IMPLANTABLE CARDIOVERTER DEFIBRILLATOR N/A 05/07/2014   Procedure: BI-VENTRICULAR IMPLANTABLE CARDIOVERTER DEFIBRILLATOR  (CRT-D);  Surgeon: Bianca Sprang, MD;  Location: [facility] CATH LAB;  Service: Cardiovascular;  Laterality: N/A;   CARDIAC CATHETERIZATION  03-03-2014  dr Kathlyn [place]   [facility]   pLAD 20%, pRCA 20%, dRCA 50%, RPLS 50%;  ef 30%, mild elevated LVEDP, mild gradient across aortic valve LVOT   CARDIOVASCULAR STRESS TEST  11/25/2013   High risk nuclear study w/ large high severity inferior wall perfusion defect on stress and rest images, large mild severity anteroseptal wall perfusion defect on stress  and rest images, No inducible ischemia/ global moderate hypokinesis, ef 28%   CATARACT EXTRACTION W/ INTRAOCULAR LENS  IMPLANT, BILATERAL Bilateral right 12-2013 / left  02-2014   colonscopy  11/2020   benign polyps removed   CYSTOSCOPY W/ RETROGRADES Bilateral 01/06/2015   Procedure: CYSTOSCOPY WITH  BLADDER BIOPSY BILATERAL RETROGRADE PYELOGRAM,INSTILLATION OF MITOMYCIN C;  Surgeon: Bianca Aloe, MD;  Location: [facility];  Service: Urology;  Laterality: Bilateral;   CYSTOSCOPY WITH BIOPSY N/A 10/06/2015   Procedure: CYSTO WITH BLADDER BIOPSY, FULGERATION, CHEMO IRRIGATION EPIRUBICIN IN PACU;  Surgeon: Bianca Aloe, MD;  Location: [facility];  Service: Urology;  Laterality: N/A;   CYSTOSCOPY WITH BIOPSY N/A 02/12/2019   Procedure: CYSTOSCOPY WITH BIOPSY/ FULGURATION/ INSTILLATION OF GEMCITABINE, bilateral retrograde turbt greater 5cm;  Surgeon: Bianca Aloe, MD;  Location[facility];  Service: Urology;  Laterality: N/A;   CYSTOSCOPY WITH FULGERATION N/A 06/30/2017   Procedure: Marland Kitchen CYSTOSCOPY WITH [place] FULGERATION/ BLADDER BIOPSY/ INSTILLATION OF EPIRUBICIN;  Surgeon: Bianca Aloe, MD;  Location: [facility];  Service: Urology;  Laterality: N/A;   CYSTOSCOPY WITH RETROGRADE PYELOGRAM, URETEROSCOPY AND STENT PLACEMENT Bilateral 04/18/2014   Procedure: CYSTOSCOPY WITH RETROGRADE PYELOGRAM;  Surgeon: Bianca Aloe, MD;  Location: [facility];  Service: Urology;  Laterality: Bilateral;   ESOPHAGOGASTRODUODENOSCOPY N/A 11/28/2013   Procedure: ESOPHAGOGASTRODUODENOSCOPY (EGD);  Surgeon: Bianca Silence, MD;  Location: [facility] ENDOSCOPY;  Service: Endoscopy;  Laterality: N/A;   LUMBAR DISC SURGERY  1980's   "ruptured disc"   SPINAL CORD STIMULATOR IMPLANT  10-09-2018   @[facility]    Thoracic spinal cord stimular/ genertor  (left flank)----- (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[employer])   TRANSTHORACIC ECHOCARDIOGRAM  12/31/2015   dr [name]   ef 24-235, grade 1 diastolic dysfunction/ mild MR/ septal motion showed abnormal function and dyssynergy   TRANSURETHRAL RESECTION OF BLADDER  1995   TRANSURETHRAL RESECTION OF BLADDER TUMOR N/A 12/13/2013   Procedure: TRANSURETHRAL RESECTION OF BLADDER TUMOR (TURBT);  Surgeon: Bianca Aloe, MD;  Location: [facility];  Service: Urology;  Laterality: N/A;   TRANSURETHRAL RESECTION OF BLADDER TUMOR N/A 01/17/2014   Procedure: TRANSURETHRAL RESECTION OF BLADDER TUMOR (TURBT);  Surgeon: Bianca Aloe, MD;  Location: [facility];  Service: Urology;  Laterality: N/A;   TRANSURETHRAL RESECTION OF BLADDER TUMOR N/A 04/18/2014   Procedure: TRANSURETHRAL RESECTION OF  BLADDER TUMOR (TURBT), CYSTOGRAM;  Surgeon: Bianca Aloe, MD;  Location: [facility];  Service: Urology;  Laterality: N/A;   TRANSURETHRAL RESECTION OF BLADDER TUMOR N/A 12/13/2019   Procedure: TRANSURETHRAL RESECTION OF BLADDER TUMOR (TURBT) GREATER THAN 5CM WITH CYSTOSCOPY/ POST OPERATIVE INSTILLATION OF GEMCITABINE;  Surgeon: Bianca Aloe, MD;  Location: [facility];  Service: Urology;  Laterality: N/A;   TRANSURETHRAL RESECTION OF BLADDER TUMOR N/A 10/06/2020   Procedure: TRANSURETHRAL RESECTION OF BLADDER TUMOR (TURBT)/ CYSTOSCOPY/  BLADDER BIOPSY;  Surgeon: Bianca Aloe, MD;  Location: [facility];  Service: Urology;  Laterality: N/A;    Current Outpatient Medications  Medication Sig Dispense Refill   acetaminophen (TYLENOL) 500 MG tablet Take 1,000 mg by mouth every 8 (eight) hours as needed for moderate pain.     amitriptyline (ELAVIL) 10 MG tablet Take 2 tablets (20 mg total) by mouth at bedtime. 60 tablet 3   amLODipine (NORVASC) 2.5 MG tablet TAKE 1 TABLET(2.5 MG) BY MOUTH DAILY MUST KEEP UPCOMING APPT FOR FURTHER REFILLS. THANK YOU! (Patient taking differently: daily. TAKE 1 TABLET(2.5 MG) BY MOUTH DAILY MUST KEEP UPCOMING APPT FOR FURTHER REFILLS. THANK YOU!) 90 tablet 0  gabapentin (NEURONTIN) 300 MG capsule Take 1-2 capsules (300-600 mg total) by mouth 3 (three) times daily. (Patient taking differently: Take 300-600 mg by mouth 4 (four) times daily. Takes 3 to 4 capsules of 300 mg capsules 3 to 4 times per day) 90 capsule 3   losartan (COZAAR) 100 MG tablet TAKE 1 TABLET(100 MG) BY MOUTH AT BEDTIME 90 tablet 1   Menthol, Topical Analgesic, (BIOFREEZE EX) Apply 1 application topically 4 (four) times daily as needed (back pain).     metoprolol succinate (TOPROL-XL) 25 MG 24 hr tablet TAKE 1 TABLET(25 MG) BY MOUTH DAILY 30 tablet 0   oxyCODONE-acetaminophen (PERCOCET) 10-325 MG tablet Take 1 tablet by mouth every 4 (four) hours as needed for pain. (Patient taking  differently: Take 1 tablet by mouth 4 (four) times daily.) 150 tablet 0   [START ON 03/10/2021] oxyCODONE-acetaminophen (PERCOCET) 10-325 MG tablet Take 1 tablet by mouth every 4 (four) hours as needed for pain. 150 tablet 0   [START ON 04/09/2021] oxyCODONE-acetaminophen (PERCOCET) 10-325 MG tablet Take 1 tablet by mouth every 4 (four) hours as needed for pain. 150 tablet 0   pantoprazole (PROTONIX) 40 MG tablet Take 40 mg by mouth at bedtime.     polyethylene glycol (MIRALAX / GLYCOLAX) packet Take 17 g by mouth daily as needed for mild constipation.      promethazine (PHENERGAN) 12.5 MG tablet Take 1 tablet (12.5 mg total) by mouth every 8 (eight) hours as needed for nausea or vomiting. 30 tablet 2   rosuvastatin (CRESTOR) 5 MG tablet TAKE 1 TABLET(5 MG) BY MOUTH DAILY 90 tablet 0   sucralfate (CARAFATE) 1 g tablet TAKE 1 TABLET BY MOUTH ONE HOUR BEFORE EACH MEAL AND 2 HOURS AFTER OTHER MEDICATIONS (Patient taking differently: Take 1 g by mouth See admin instructions. TAKE 1 TABLET BY MOUTH ONE HOUR BEFORE EACH MEAL AND 2 HOURS AFTER OTHER MEDICATIONS) 90 tablet 1   No current facility-administered medications for this visit.   Facility-Administered Medications Ordered in Other Visits  Medication Dose Route Frequency Provider Last Rate Last Admin   epirubicin (ELLENCE) 50 mg in sodium chloride 0.9 % bladder instillation  50 mg Bladder Instillation Once Bianca Aloe, MD       epirubicin (ELLENCE) 80 mg in sodium chloride (PF) 0.9 % bladder instillation  80 mg Bladder Instillation Once Nyoka Cowden, Terri L, RPH       gemcitabine (GEMZAR) chemo syringe for bladder instillation 2,000 mg  2,000 mg Bladder Instillation Once Bianca Aloe, MD       gemcitabine [facility]) chemo syringe for bladder instillation 2,000 mg  2,000 mg Bladder Instillation Once Bianca Aloe, MD        Allergies:   Patient has no known allergies.   Social History: Social History   Socioeconomic History   Marital  status: Widowed    Spouse name: Not on file   Number of children: Not on file   Years of education: Not on file   Highest education level: Not on file  Occupational History   Not on file  Tobacco Use   Smoking status: Every Day    Packs/day: 0.50    Years: 56.00    Pack years: 28.00    Types: Cigarettes   Smokeless tobacco: Never   Tobacco comments:    02-11-2019  per pt currently smokes 10 cig's per day , started smoking age 39  Vaping Use   Vaping Use: Never used  Substance and Sexual Activity   Alcohol  use: No   Drug use: No   Sexual activity: Never    Birth control/protection: Post-menopausal  Other Topics Concern   Not on file  Social History Narrative   Not on file   Social Determinants of Health   Financial Resource Strain: Low Risk    Difficulty of Paying Living Expenses: Not hard at all  Food Insecurity: No Food Insecurity   Worried About Charity fundraiser in the Last Year: Never true   [place] in the Last Year: Never true  Transportation Needs: No Transportation Needs   Lack of Transportation (Medical): No   Lack of Transportation (Non-Medical): No  Physical Activity: Sufficiently Active   Days of Exercise per Week: 7 days   Minutes of Exercise per Session: 30 min  Stress: No Stress Concern Present   Feeling of Stress : Not at all  Social Connections: Unknown   Frequency of Communication with Friends and Family: More than three times a week   Frequency of Social Gatherings with Friends and Family: More than three times a week   Attends Religious Services: Not on Electrical engineer or Organizations: Not on file   Attends Archivist Meetings: Not on file   Marital Status: Not on file  Intimate Partner Violence: Not At Risk   Fear of Current or Ex-Partner: No   Emotionally Abused: No   Physically Abused: No   Sexually Abused: No    Family History: Family History  Problem Relation Age of Onset   Cancer Mother    Cancer  Brother     Review of Systems: All other systems reviewed and are otherwise negative except as noted above.   Physical Exam: There were no vitals filed for this visit.   GEN- The patient is well appearing, alert and oriented x 3 today.   HEENT: normocephalic, atraumatic; sclera clear, conjunctiva pink; hearing intact; oropharynx clear; neck supple, no JVP Lymph- no cervical lymphadenopathy Lungs- Clear to ausculation bilaterally, normal work of breathing.  No wheezes, rales, rhonchi Heart- Regular rate and rhythm, no murmurs, rubs or gallops, PMI not laterally displaced GI- soft, non-tender, non-distended, bowel sounds present, no hepatosplenomegaly Extremities- no clubbing or cyanosis. No edema; DP/PT/radial pulses 2+ bilaterally MS- no significant deformity or atrophy Skin- warm and dry, no rash or lesion; ICD pocket well healed Psych- euthymic mood, full affect Neuro- strength and sensation are intact  ICD interrogation- reviewed in detail today,  See PACEART report  EKG:  EKG is ordered today. Personal review of EKG ordered today shows BiV pacing at 69 bpm  Recent Labs: 12/09/2020: ALT 25; BUN 11; Creatinine, Ser 0.53; Hemoglobin 14.1; Platelets 154; Potassium 3.6; Sodium 132   Wt Readings from Last 3 Encounters:  02/04/21 120 lb (54.4 kg)  12/11/20 120 lb (54.4 kg)  12/09/20 119 lb 14.9 oz (54.4 kg)     Other studies Reviewed: Additional studies/ records that were reviewed today include: Previous EP office notes.   Assessment and Plan:  Orthostatic Hypotension    Cardiomyopathy nonischemic   Congestive heart failure-chronic systolic   COPD/emphysema   Hypertension   Left bundle branch block   PVCs-infrequent   Sinus bradycardia-borderline  Euvolemic today Stable on an appropriate medical regimen Normal ICD function See Pace Art report No changes today  She is OK from a cardiac perspective to proceed with "Transurethral resection of Bladder  Tumor/Cystoscopy/Bladder Biopsy/Post op instillation of gemcitabine"  With updated follow up device clearance  can also be given prior to surgery. She tolerated same procedure in July.   Current medicines are reviewed at length with the patient today.    Disposition:   Follow up with Dr. Caryl Anderson in 6 months    Signed, Shirley Friar, PA-C  02/24/2021 2:00 PM  [place] [address] [place] [address] [phone] (office) [phone] (fax)

## 2021-02-25 ENCOUNTER — Ambulatory Visit (INDEPENDENT_AMBULATORY_CARE_PROVIDER_SITE_OTHER): Payer: Medicare Other | Admitting: Student

## 2021-02-25 ENCOUNTER — Encounter: Payer: Self-pay | Admitting: Student

## 2021-02-25 ENCOUNTER — Other Ambulatory Visit: Payer: Self-pay

## 2021-02-25 VITALS — BP 130/80 | HR 69 | Ht 68.0 in | Wt 118.6 lb

## 2021-02-25 DIAGNOSIS — I420 Dilated cardiomyopathy: Secondary | ICD-10-CM | POA: Diagnosis not present

## 2021-02-25 DIAGNOSIS — Z01810 Encounter for preprocedural cardiovascular examination: Secondary | ICD-10-CM | POA: Diagnosis not present

## 2021-02-25 DIAGNOSIS — I428 Other cardiomyopathies: Secondary | ICD-10-CM

## 2021-02-25 DIAGNOSIS — I5022 Chronic systolic (congestive) heart failure: Secondary | ICD-10-CM

## 2021-02-25 DIAGNOSIS — I10 Essential (primary) hypertension: Secondary | ICD-10-CM

## 2021-02-25 DIAGNOSIS — I447 Left bundle-branch block, unspecified: Secondary | ICD-10-CM | POA: Diagnosis not present

## 2021-02-25 DIAGNOSIS — I2510 Atherosclerotic heart disease of native coronary artery without angina pectoris: Secondary | ICD-10-CM | POA: Diagnosis not present

## 2021-02-25 DIAGNOSIS — I493 Ventricular premature depolarization: Secondary | ICD-10-CM

## 2021-02-25 DIAGNOSIS — Z01818 Encounter for other preprocedural examination: Secondary | ICD-10-CM

## 2021-02-25 LAB — CUP PACEART INCLINIC DEVICE CHECK
Battery Remaining Longevity: 6 mo
Battery Voltage: 2.76 V
Brady Statistic AP VP Percent: 47.07 %
Brady Statistic AP VS Percent: 0.78 %
Brady Statistic AS VP Percent: 50.91 %
Brady Statistic AS VS Percent: 1.24 %
Brady Statistic RA Percent Paced: 46.3 %
Brady Statistic RV Percent Paced: 94.42 %
Date Time Interrogation Session: 20221208123348
HighPow Impedance: 77 Ohm
Implantable Lead Implant Date: 20160217
Implantable Lead Implant Date: 20160217
Implantable Lead Implant Date: 20160217
Implantable Lead Location: 753858
Implantable Lead Location: 753859
Implantable Lead Location: 753860
Implantable Lead Model: 4298
Implantable Lead Model: 5076
Implantable Pulse Generator Implant Date: 20160217
Lead Channel Impedance Value: 399 Ohm
Lead Channel Impedance Value: 456 Ohm
Lead Channel Impedance Value: 456 Ohm
Lead Channel Impedance Value: 475 Ohm
Lead Channel Impedance Value: 475 Ohm
Lead Channel Impedance Value: 532 Ohm
Lead Channel Impedance Value: 551 Ohm
Lead Channel Impedance Value: 551 Ohm
Lead Channel Impedance Value: 722 Ohm
Lead Channel Impedance Value: 760 Ohm
Lead Channel Impedance Value: 817 Ohm
Lead Channel Impedance Value: 817 Ohm
Lead Channel Impedance Value: 874 Ohm
Lead Channel Pacing Threshold Amplitude: 0.5 V
Lead Channel Pacing Threshold Amplitude: 0.875 V
Lead Channel Pacing Threshold Amplitude: 1.25 V
Lead Channel Pacing Threshold Pulse Width: 0.4 ms
Lead Channel Pacing Threshold Pulse Width: 0.4 ms
Lead Channel Pacing Threshold Pulse Width: 0.4 ms
Lead Channel Sensing Intrinsic Amplitude: 13.75 mV
Lead Channel Sensing Intrinsic Amplitude: 19.875 mV
Lead Channel Sensing Intrinsic Amplitude: 2.625 mV
Lead Channel Sensing Intrinsic Amplitude: 4.125 mV
Lead Channel Setting Pacing Amplitude: 2 V
Lead Channel Setting Pacing Amplitude: 2.25 V
Lead Channel Setting Pacing Amplitude: 2.5 V
Lead Channel Setting Pacing Pulse Width: 0.4 ms
Lead Channel Setting Pacing Pulse Width: 0.4 ms
Lead Channel Setting Sensing Sensitivity: 0.45 mV

## 2021-02-25 MED ORDER — METOPROLOL SUCCINATE ER 25 MG PO TB24: ORAL_TABLET | ORAL | 3 refills | Status: DC

## 2021-02-25 NOTE — Patient Instructions (Signed)
Medication Instructions:  Your physician recommends that you continue on your current medications as directed. Please refer to the Current Medication list given to you today.  *If you need a refill on your cardiac medications before your next appointment, please call your pharmacy*   Lab Work: None If you have labs (blood work) drawn today and your tests are completely normal, you will receive your results only by: MyChart Message (if you have MyChart) OR A paper copy in the mail If you have any lab test that is abnormal or we need to change your treatment, we will call you to review the results.   Follow-Up: At [facility], you and your health needs are our priority.  As part of our continuing mission to provide you with exceptional heart care, we have created designated Provider Care Teams.  These Care Teams include your primary Cardiologist (physician) and Advanced Practice Providers (APPs -  Physician Assistants and Nurse Practitioners) who all work together to provide you with the care you need, when you need it.  Your next appointment:   6 month(s)  The format for your next appointment:   In Person  Provider:   You may see Steven Klein, MD or one of the following Advanced Practice Providers on your designated Care Team:   Renee Ursuy, PA-C Michael "Andy" Tillery, PA-C    

## 2021-02-26 ENCOUNTER — Other Ambulatory Visit: Payer: Self-pay | Admitting: Urology

## 2021-03-04 ENCOUNTER — Encounter: Payer: Self-pay | Admitting: Internal Medicine

## 2021-03-04 NOTE — Progress Notes (Signed)
PERIOPERATIVE PRESCRIPTION FOR IMPLANTED CARDIAC DEVICE PROGRAMMING  Patient Information: Name:  [name]  DOB:  09/19/1940  MRN:  [id number]    Planned Procedure:  Cysto, TURBT  Surgeon:  Dr. Junious Silk  Date of Procedure:  03/19/21  Cautery will be used.  Position during surgery:     Please send documentation back to:  [facility] (Fax # [phone])  Device Information:  Clinic EP Physician:  Virl Axe, MD   Device Type:  Defibrillator Manufacturer and Phone #:  Medtronic: [phone] Pacemaker Dependent?:  No. Date of Last Device Check:  02/25/21 Normal Device Function?:  Yes.    Electrophysiologist's Recommendations:  Have magnet available. Provide continuous ECG monitoring when magnet is used or reprogramming is to be performed.  Procedure should not interfere with device function.  No device programming or magnet placement needed.  Per Device Clinic Standing Orders, Simone Curia, RN  3:33 PM 03/04/2021

## 2021-03-05 ENCOUNTER — Encounter (HOSPITAL_COMMUNITY): Payer: Self-pay

## 2021-03-05 ENCOUNTER — Encounter (HOSPITAL_COMMUNITY)
Admission: RE | Admit: 2021-03-05 | Discharge: 2021-03-05 | Disposition: A | Discharge status: Home / Self Care | Payer: Medicare Other | Source: Ambulatory Visit | Attending: Urology | Admitting: Urology

## 2021-03-05 ENCOUNTER — Other Ambulatory Visit: Payer: Self-pay

## 2021-03-05 VITALS — BP 144/85 | HR 20 | Temp 98.2°F | Resp 20 | Ht 68.0 in | Wt 120.0 lb

## 2021-03-05 DIAGNOSIS — Z01812 Encounter for preprocedural laboratory examination: Secondary | ICD-10-CM | POA: Insufficient documentation

## 2021-03-05 DIAGNOSIS — I10 Essential (primary) hypertension: Secondary | ICD-10-CM | POA: Insufficient documentation

## 2021-03-05 LAB — BASIC METABOLIC PANEL
Anion gap: 6 (ref 5–15)
BUN: 13 mg/dL (ref 8–23)
CO2: 27 mmol/L (ref 22–32)
Calcium: 9.7 mg/dL (ref 8.9–10.3)
Chloride: 98 mmol/L (ref 98–111)
Creatinine, Ser: 0.53 mg/dL (ref 0.44–1.00)
GFR, Estimated: >60 mL/min (ref >60)
Glucose, Bld: 113 mg/dL — ABNORMAL HIGH (ref 70–99)
Potassium: 4.9 mmol/L (ref 3.5–5.1)
Sodium: 131 mmol/L — ABNORMAL LOW (ref 135–145)

## 2021-03-05 LAB — CBC
HCT: 41.2 % (ref 36.0–46.0)
Hemoglobin: 13.5 g/dL (ref 12.0–15.0)
MCH: 31.3 pg (ref 26.0–34.0)
MCHC: 32.8 g/dL (ref 30.0–36.0)
MCV: 95.4 fL (ref 80.0–100.0)
Platelets: 153 10*3/uL (ref 150–400)
RBC: 4.32 MIL/uL (ref 3.87–5.11)
RDW: 13.2 % (ref 11.5–15.5)
WBC: 7.2 10*3/uL (ref 4.0–10.5)
nRBC: 0 % (ref 0.0–0.2)

## 2021-03-05 NOTE — Progress Notes (Signed)
COVID test- NA   PCP - Dr. Judieth Keens Cardiologist - Dr. Olin Pia  Chest x-ray - no EKG - 02/25/21-epic Stress Test - 2015 ECHO - 08/31/18-epic Cardiac Cath - 2016 Pacemaker/ICD device last checked:02/25/21. Orders on chart  Sleep Study - no CPAP -   Fasting Blood Sugar - NA Checks Blood Sugar _____ times a day  Blood Thinner Instructions:NA Aspirin Instructions: Last Dose:  Anesthesia review: yes  Patient denies shortness of breath, fever, cough and chest pain at PAT appointment Pt uses a walker for balance. She has chronic back pain and uses pain medication daily.   Patient verbalized understanding of instructions that were given to them at the PAT appointment. Patient was also instructed that they will need to review over the PAT instructions again at home before surgery. Yes. She has a care giver. I encouraged her to review the instructions with her care giver at home

## 2021-03-05 NOTE — Patient Instructions (Addendum)
DUE TO COVID-19 ONLY ONE VISITOR IS ALLOWED TO COME WITH YOU AND STAY IN THE WAITING ROOM ONLY DURING PRE OP AND PROCEDURE DAY OF SURGERY IF YOU ARE GOING HOME AFTER SURGERY. IF YOU ARE SPENDING THE NIGHT 2 PEOPLE MAY VISIT WITH YOU IN YOUR PRIVATE ROOM AFTER SURGERY UNTIL VISITING  HOURS ARE OVER AT 800 PM AND 1  VISITOR  MAY  SPEND THE NIGHT.                [name]     Your procedure is scheduled on: 03/19/21   Report to [facility] Main  Entrance   Report to admitting at  12:00pm     Call this number if you have problems the morning of surgery [phone]    Remember: Do not eat food  :After Midnight the night before your surgery,   You may have clear liquids from midnight until 7:00 am    CLEAR LIQUID DIET   Foods Allowed                                                                     Foods Excluded  Coffee and tea, regular and decaf                             liquids that you cannot  Plain Jell-O any favor except red or purple                                           see through such as: Fruit ices (not with fruit pulp)                                     milk, soups, orange juice  Iced Popsicles                                    All solid food Carbonated beverages, regular and diet                                    Cranberry, grape and apple juices Sports drinks like Gatorade Lightly seasoned clear broth or consume(fat free) Sugar   BRUSH YOUR TEETH MORNING OF SURGERY AND RINSE YOUR MOUTH OUT, NO CHEWING GUM CANDY OR MINTS.     Take these medicines the morning of surgery with A SIP OF WATER: Gabapentin or pain med, Metoprolol, Amlodipine Pantoprazole              Bring the spinal stimulator remote to the hospital                               You may not have any metal on your body including hair pins and              piercings  Do not wear jewelry, make-up, lotions, powders or perfumes, deodorant             Do not wear nail polish on your  fingernails.  Do not shave  48 hours prior to surgery.              Men may shave face and neck.   Do not bring valuables to the hospital. [place].  Contacts, dentures or bridgework may not be worn into surgery.  Leave suitcase in the car. After surgery it may be brought to your room.     Patients discharged the day of surgery will not be allowed to drive home.   IF YOU ARE HAVING SURGERY AND GOING HOME THE SAME DAY, YOU MUST HAVE AN ADULT TO DRIVE YOU HOME AND BE WITH YOU FOR 24 HOURS. YOU MAY GO HOME BY TAXI OR UBER OR ORTHERWISE, BUT AN ADULT MUST ACCOMPANY YOU HOME AND STAY WITH YOU FOR 24 HOURS.  Name and phone number of your driver:  Special Instructions: N/A              Please read over the following fact sheets you were given: _____________________________________________________________________             [facility] - Preparing for Surgery Before surgery, you can play an important role.  Because skin is not sterile, your skin needs to be as free of germs as possible.  You can reduce the number of germs on your skin by washing with CHG (chlorahexidine gluconate) soap before surgery.  CHG is an antiseptic cleaner which kills germs and bonds with the skin to continue killing germs even after washing. Please DO NOT use if you have an allergy to CHG or antibacterial soaps.  If your skin becomes reddened/irritated stop using the CHG and inform your nurse when you arrive at Short Stay. Do not shave (including legs and underarms) for at least 48 hours prior to the first CHG shower.   Please follow these instructions carefully:  1.  Shower with CHG Soap the night before surgery and the  morning of Surgery.  2.  If you choose to wash your hair, wash your hair first as usual with your  normal  shampoo.  3.  After you shampoo, rinse your hair and body thoroughly to remove the  shampoo.                            4.  Use CHG as you would  any other liquid soap.  You can apply chg directly  to the skin and wash                       Gently with a scrungie or clean washcloth.  5.  Apply the CHG Soap to your body ONLY FROM THE NECK DOWN.   Do not use on face/ open                           Wound or open sores. Avoid contact with eyes, ears mouth and genitals (private parts).                       Wash face,  Genitals (private parts) with your normal soap.  6.  Wash thoroughly, paying special attention to the area where your surgery  will be performed.  7.  Thoroughly rinse your body with warm water from the neck down.  8.  DO NOT shower/wash with your normal soap after using and rinsing off  the CHG Soap.                9.  Pat yourself dry with a clean towel.            10.  Wear clean pajamas.            11.  Place clean sheets on your bed the night of your first shower and do not  sleep with pets. Day of Surgery : Do not apply any lotions/deodorants the morning of surgery.  Please wear clean clothes to the hospital/surgery center.  FAILURE TO FOLLOW THESE INSTRUCTIONS MAY RESULT IN THE CANCELLATION OF YOUR SURGERY PATIENT SIGNATURE_________________________________  NURSE SIGNATURE__________________________________  ________________________________________________________________________

## 2021-03-11 NOTE — Progress Notes (Signed)
Anesthesia Chart Review   Case: [id number] Date/Time: 03/19/21 1400   Procedure: CYSTOSCOPY WITH BLADDERBIOPSY/ TRANSURETHRAL RESECTION OF BLADDER TUMORE WITH POST-OPERATIVE INSTILLATION OF EPIRUBICIN   Anesthesia type: General   Pre-op diagnosis: BLADDER CANCER   Location: [place] / [facility]   Surgeons: [name], MD       DISCUSSION:80 y.o. smoker with h/o HTN, LBBB, GERD, NICM, AICD in place (device orders in 03/04/21 note), COPD, bladder cancer scheduled for above procedure 03/19/2021 with Dr. [name].   Pt last seen by cardiology 02/25/2021. Per OV note, "She is OK from a cardiac perspective to proceed with "Transurethral resection of Bladder Tumor/Cystoscopy/Bladder Biopsy/Post op instillation of gemcitabine"  Spinal cord stimulator in place, advised to bring remote DOS.   Anticipate pt can proceed with planned procedure barring acute status change.   VS: BP (!) 144/85    Pulse (!) 20    Temp 36.8 C (Oral)    Resp 20    Ht 5\' 8"  (1.727 m)    Wt 54.4 kg    SpO2 98%    BMI 18.25 kg/m   PROVIDERS: [name], MD is PCP   Virl Axe, MD is Cardiologist  LABS: Labs reviewed: Acceptable for surgery. (all labs ordered are listed, but only abnormal results are displayed)  Labs Reviewed  BASIC METABOLIC PANEL - Abnormal; Notable for the following components:      Result Value   Sodium 131 (*)    Glucose, Bld 113 (*)    All other components within normal limits  CBC     IMAGES:   EKG: 02/25/2021 Rate 69 bpm  Biventricular pacing  CV: Echo 08/31/2018 1. The left ventricle has normal systolic function with an ejection  fraction of 60-65%. The cavity size was normal. Left ventricular diastolic  Doppler parameters are consistent with impaired relaxation.   2. The right ventricle has normal systolic function. The cavity was  normal. There is no increase in right ventricular wall thickness.Normal  RVSP   3. PVCs noted  Past Medical History:   Diagnosis Date   AICD (automatic cardioverter/defibrillator) present EP cardiologist--- dr Caryl Comes    placement 05-07-2014 , ef 25%,  NICM/  (02-11-2019 last echo 08-31-2018 ef 60-65%)   Arthritis    "in about all my joints; for sure in my back"   Bladder cancer [facility]) urologist-  dr Junious Silk   dx 1995--  recurrent bladder cancer 2015 , s/p TURBT's and chemo instillation's ;   04/ 2019  s/p TURBT   Chronic hyponatremia    Chronic low back pain with bilateral sciatica    s/p  spinal cord stimulator @ [facility]  [phone]   Chronic systolic (congestive) heart failure [facility])    cardiologist-  dr Rogue Jury   COPD with emphysema ([place])    (02-11-2019  per pt has never been on oxygen)   Coronary artery disease cardiologist-  dr Kathlyn [place]   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% per cardiac cath 03-03-2014   DDD (degenerative disc disease), thoracolumbar    Frequent urination    Full dentures    Gait instability    due to chronic low back pain, uses roller walker   GERD (gastroesophageal reflux disease)    History of iron deficiency anemia 11/2013   resolved w/ IV Iron  (02-11-2019  per pt has not had any issues since 2015)   History of stomach ulcers 11/2013   Hypertension    LBBB (left bundle branch block)  NICM (nonischemic cardiomyopathy) ([place]) last echo 12-31-2015 ef 55-60%   dx 09/ 2015 per echo 20%;  myoview 09/ 2015 ef 28%;  per cardiac cath 12/ 2015 ef 30%;     Nocturia more than twice per night    S/P insertion of spinal cord stimulator followed by [facility]---- Dr Pecola Leisure (notes in care everywhere)   10-09-2018  @[facility] --- thoracic spinal cord stimular/ generator  (device from [facility])---- per pt has a control   Scoliosis    Uses walker 02/18/2021   all the time   Wears glasses    for reading    Past Surgical History:  Procedure Laterality Date   BI-VENTRICULAR IMPLANTABLE CARDIOVERTER DEFIBRILLATOR N/A 05/07/2014   Procedure: BI-VENTRICULAR IMPLANTABLE CARDIOVERTER  DEFIBRILLATOR  (CRT-D);  Surgeon: Deboraha Sprang, MD;  Location: [facility] CATH LAB;  Service: Cardiovascular;  Laterality: N/A;   CARDIAC CATHETERIZATION  03-03-2014  dr Kathlyn [place]   [facility]   pLAD 20%, pRCA 20%, dRCA 50%, RPLS 50%;  ef 30%, mild elevated LVEDP, mild gradient across aortic valve LVOT   CARDIOVASCULAR STRESS TEST  11/25/2013   High risk nuclear study w/ large high severity inferior wall perfusion defect on stress and rest images, large mild severity anteroseptal wall perfusion defect on stress and rest images, No inducible ischemia/ global moderate hypokinesis, ef 28%   CATARACT EXTRACTION W/ INTRAOCULAR LENS  IMPLANT, BILATERAL Bilateral right 12-2013 / left  02-2014   colonscopy  11/2020   benign polyps removed   CYSTOSCOPY W/ RETROGRADES Bilateral 01/06/2015   Procedure: CYSTOSCOPY WITH  BLADDER BIOPSY BILATERAL RETROGRADE PYELOGRAM,INSTILLATION OF MITOMYCIN C;  Surgeon: [name], MD;  Location: [facility];  Service: Urology;  Laterality: Bilateral;   CYSTOSCOPY WITH BIOPSY N/A 10/06/2015   Procedure: CYSTO WITH BLADDER BIOPSY, FULGERATION, CHEMO IRRIGATION EPIRUBICIN IN PACU;  Surgeon: [name], MD;  Location: [facility];  Service: Urology;  Laterality: N/A;   CYSTOSCOPY WITH BIOPSY N/A 02/12/2019   Procedure: CYSTOSCOPY WITH BIOPSY/ FULGURATION/ INSTILLATION OF GEMCITABINE, bilateral retrograde turbt greater 5cm;  Surgeon: [name], MD;  Location: [facility];  Service: Urology;  Laterality: N/A;   CYSTOSCOPY WITH FULGERATION N/A 06/30/2017   Procedure: Marland Kitchen CYSTOSCOPY WITH [place] FULGERATION/ BLADDER BIOPSY/ INSTILLATION OF EPIRUBICIN;  Surgeon: [name], MD;  Location: [facility];  Service: Urology;  Laterality: N/A;   CYSTOSCOPY WITH RETROGRADE PYELOGRAM, URETEROSCOPY AND STENT PLACEMENT Bilateral 04/18/2014   Procedure: CYSTOSCOPY WITH RETROGRADE PYELOGRAM;  Surgeon: [name], MD;  Location: [facility];  Service:  Urology;  Laterality: Bilateral;   ESOPHAGOGASTRODUODENOSCOPY N/A 11/28/2013   Procedure: ESOPHAGOGASTRODUODENOSCOPY (EGD);  Surgeon: Arta Silence, MD;  Location: [facility] ENDOSCOPY;  Service: Endoscopy;  Laterality: N/A;   LUMBAR DISC SURGERY  1980's   "ruptured disc"   SPINAL CORD STIMULATOR IMPLANT  10-09-2018   @[facility]    Thoracic spinal cord stimular/ genertor  (left flank)----- (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[employer])   TRANSTHORACIC ECHOCARDIOGRAM  12/31/2015   dr Caryl Comes   ef 18-841, grade 1 diastolic dysfunction/ mild MR/ septal motion showed abnormal function and dyssynergy   TRANSURETHRAL RESECTION OF BLADDER  1995   TRANSURETHRAL RESECTION OF BLADDER TUMOR N/A 12/13/2013   Procedure: TRANSURETHRAL RESECTION OF BLADDER TUMOR (TURBT);  Surgeon: [name], MD;  Location: [facility];  Service: Urology;  Laterality: N/A;   TRANSURETHRAL RESECTION OF BLADDER TUMOR N/A 01/17/2014   Procedure: TRANSURETHRAL RESECTION OF BLADDER TUMOR (TURBT);  Surgeon: [name], MD;  Location: [facility];  Service: Urology;  Laterality:  N/A;   TRANSURETHRAL RESECTION OF BLADDER TUMOR N/A 04/18/2014   Procedure: TRANSURETHRAL RESECTION OF BLADDER TUMOR (TURBT), CYSTOGRAM;  Surgeon: [name], MD;  Location: [facility];  Service: Urology;  Laterality: N/A;   TRANSURETHRAL RESECTION OF BLADDER TUMOR N/A 12/13/2019   Procedure: TRANSURETHRAL RESECTION OF BLADDER TUMOR (TURBT) GREATER THAN 5CM WITH CYSTOSCOPY/ POST OPERATIVE INSTILLATION OF GEMCITABINE;  Surgeon: [name], MD;  Location: [facility];  Service: Urology;  Laterality: N/A;   TRANSURETHRAL RESECTION OF BLADDER TUMOR N/A 10/06/2020   Procedure: TRANSURETHRAL RESECTION OF BLADDER TUMOR (TURBT)/ CYSTOSCOPY/  BLADDER BIOPSY;  Surgeon: [name], MD;  Location: [facility];  Service: Urology;  Laterality: N/A;    MEDICATIONS:  acetaminophen (TYLENOL) 500 MG tablet   amitriptyline (ELAVIL) 10 MG tablet   amLODipine (NORVASC) 2.5 MG tablet    gabapentin (NEURONTIN) 300 MG capsule   losartan (COZAAR) 100 MG tablet   Menthol, Topical Analgesic, (BIOFREEZE EX)   metoprolol succinate (TOPROL-XL) 25 MG 24 hr tablet   oxyCODONE-acetaminophen (PERCOCET) 10-325 MG tablet   [START ON 04/09/2021] oxyCODONE-acetaminophen (PERCOCET) 10-325 MG tablet   pantoprazole (PROTONIX) 40 MG tablet   polyethylene glycol (MIRALAX / GLYCOLAX) packet   promethazine (PHENERGAN) 12.5 MG tablet   rosuvastatin (CRESTOR) 5 MG tablet   sucralfate (CARAFATE) 1 g tablet   No current facility-administered medications for this encounter.    epirubicin (ELLENCE) 50 mg in sodium chloride 0.9 % bladder instillation   epirubicin (ELLENCE) 80 mg in sodium chloride (PF) 0.9 % bladder instillation   gemcitabine (GEMZAR) chemo syringe for bladder instillation 2,000 mg   gemcitabine (GEMZAR) chemo syringe for bladder instillation 2,000 mg     Konrad Felix Ward, PA-C [facility] [phone]

## 2021-03-16 ENCOUNTER — Encounter: Payer: Medicare Other | Admitting: Internal Medicine

## 2021-03-19 ENCOUNTER — Ambulatory Visit (HOSPITAL_COMMUNITY)
Admission: RE | Admit: 2021-03-19 | Discharge: 2021-03-19 | Disposition: A | Discharge status: Home / Self Care | Payer: Medicare Other | Source: Ambulatory Visit | Attending: Urology | Admitting: Urology

## 2021-03-19 ENCOUNTER — Encounter (HOSPITAL_COMMUNITY)
Admission: RE | Disposition: A | Discharge status: Home / Self Care | Payer: Self-pay | Source: Ambulatory Visit | Attending: Urology

## 2021-03-19 ENCOUNTER — Encounter (HOSPITAL_COMMUNITY): Payer: Self-pay | Admitting: Urology

## 2021-03-19 ENCOUNTER — Other Ambulatory Visit: Payer: Self-pay

## 2021-03-19 ENCOUNTER — Ambulatory Visit (HOSPITAL_COMMUNITY): Payer: Medicare Other

## 2021-03-19 ENCOUNTER — Ambulatory Visit (HOSPITAL_COMMUNITY): Payer: Medicare Other | Admitting: Physician Assistant

## 2021-03-19 DIAGNOSIS — C679 Malignant neoplasm of bladder, unspecified: Secondary | ICD-10-CM | POA: Diagnosis not present

## 2021-03-19 DIAGNOSIS — E785 Hyperlipidemia, unspecified: Secondary | ICD-10-CM | POA: Diagnosis not present

## 2021-03-19 DIAGNOSIS — I110 Hypertensive heart disease with heart failure: Secondary | ICD-10-CM | POA: Insufficient documentation

## 2021-03-19 DIAGNOSIS — C678 Malignant neoplasm of overlapping sites of bladder: Secondary | ICD-10-CM | POA: Diagnosis not present

## 2021-03-19 DIAGNOSIS — I2510 Atherosclerotic heart disease of native coronary artery without angina pectoris: Secondary | ICD-10-CM | POA: Diagnosis not present

## 2021-03-19 DIAGNOSIS — J449 Chronic obstructive pulmonary disease, unspecified: Secondary | ICD-10-CM | POA: Insufficient documentation

## 2021-03-19 DIAGNOSIS — C671 Malignant neoplasm of dome of bladder: Secondary | ICD-10-CM | POA: Insufficient documentation

## 2021-03-19 DIAGNOSIS — C674 Malignant neoplasm of posterior wall of bladder: Secondary | ICD-10-CM | POA: Insufficient documentation

## 2021-03-19 DIAGNOSIS — D090 Carcinoma in situ of bladder: Secondary | ICD-10-CM | POA: Diagnosis not present

## 2021-03-19 DIAGNOSIS — F17210 Nicotine dependence, cigarettes, uncomplicated: Secondary | ICD-10-CM | POA: Insufficient documentation

## 2021-03-19 DIAGNOSIS — J439 Emphysema, unspecified: Secondary | ICD-10-CM | POA: Diagnosis not present

## 2021-03-19 DIAGNOSIS — I447 Left bundle-branch block, unspecified: Secondary | ICD-10-CM | POA: Diagnosis not present

## 2021-03-19 HISTORY — PX: CYSTOSCOPY WITH BIOPSY: SHX5122

## 2021-03-19 SURGERY — CYSTOSCOPY, WITH BIOPSY
Anesthesia: General

## 2021-03-19 MED ORDER — GEMCITABINE CHEMO FOR BLADDER INSTILLATION 2000 MG
2000.0000 mg | Freq: Once | INTRAVENOUS | Status: AC
  Administered 2021-03-19: 16:00:00 2000 mg via INTRAVESICAL
  Filled 2021-03-19: qty 2000
  Filled 2021-03-19: qty 52.6

## 2021-03-19 MED ORDER — FENTANYL CITRATE (PF) 100 MCG/2ML IJ SOLN
INTRAMUSCULAR | Status: DC | PRN
  Administered 2021-03-19: 25 ug via INTRAVENOUS

## 2021-03-19 MED ORDER — SODIUM CHLORIDE 0.9 % IR SOLN
Status: DC | PRN
  Administered 2021-03-19: 6000 mL

## 2021-03-19 MED ORDER — DEXAMETHASONE SODIUM PHOSPHATE 10 MG/ML IJ SOLN
INTRAMUSCULAR | Status: DC | PRN
  Administered 2021-03-19: 5 mg via INTRAVENOUS

## 2021-03-19 MED ORDER — FENTANYL CITRATE PF 50 MCG/ML IJ SOSY
PREFILLED_SYRINGE | INTRAMUSCULAR | Status: AC
  Filled 2021-03-19: qty 1

## 2021-03-19 MED ORDER — LIDOCAINE 2% (20 MG/ML) 5 ML SYRINGE
INTRAMUSCULAR | Status: AC
  Filled 2021-03-19: qty 5

## 2021-03-19 MED ORDER — EPHEDRINE SULFATE-NACL 50-0.9 MG/10ML-% IV SOSY
PREFILLED_SYRINGE | INTRAVENOUS | Status: DC | PRN
  Administered 2021-03-19: 5 mg via INTRAVENOUS

## 2021-03-19 MED ORDER — SODIUM CHLORIDE (PF) 0.9 % IJ SOLN: 80.0000 mg | Freq: Once | INTRAVENOUS | Status: DC

## 2021-03-19 MED ORDER — LIDOCAINE 2% (20 MG/ML) 5 ML SYRINGE
INTRAMUSCULAR | Status: DC | PRN
Start: 2021-03-19 — End: 2021-03-19
  Administered 2021-03-19: 40 mg via INTRAVENOUS

## 2021-03-19 MED ORDER — 0.9 % SODIUM CHLORIDE (POUR BTL) OPTIME
TOPICAL | Status: DC | PRN
  Administered 2021-03-19: 14:00:00 1000 mL

## 2021-03-19 MED ORDER — PROPOFOL 10 MG/ML IV BOLUS
INTRAVENOUS | Status: DC | PRN
  Administered 2021-03-19: 20 mg via INTRAVENOUS
  Administered 2021-03-19: 100 mg via INTRAVENOUS

## 2021-03-19 MED ORDER — FENTANYL CITRATE (PF) 100 MCG/2ML IJ SOLN
INTRAMUSCULAR | Status: AC
  Filled 2021-03-19: qty 2

## 2021-03-19 MED ORDER — LACTATED RINGERS IV SOLN: INTRAVENOUS | Status: DC

## 2021-03-19 MED ORDER — CHLORHEXIDINE GLUCONATE 0.12 % MT SOLN
15.0000 mL | Freq: Once | OROMUCOSAL | Status: AC
  Administered 2021-03-19: 12:00:00 15 mL via OROMUCOSAL

## 2021-03-19 MED ORDER — FENTANYL CITRATE PF 50 MCG/ML IJ SOSY
25.0000 ug | PREFILLED_SYRINGE | INTRAMUSCULAR | Status: DC | PRN
  Administered 2021-03-19: 15:00:00 25 ug via INTRAVENOUS
  Administered 2021-03-19: 16:00:00 50 ug via INTRAVENOUS
  Administered 2021-03-19: 15:00:00 25 ug via INTRAVENOUS

## 2021-03-19 MED ORDER — CEFAZOLIN SODIUM-DEXTROSE 2-4 GM/100ML-% IV SOLN
2.0000 g | Freq: Once | INTRAVENOUS | Status: AC
  Administered 2021-03-19: 14:00:00 2 g via INTRAVENOUS
  Filled 2021-03-19: qty 100

## 2021-03-19 MED ORDER — PROPOFOL 10 MG/ML IV BOLUS
INTRAVENOUS | Status: AC
  Filled 2021-03-19: qty 20

## 2021-03-19 MED ORDER — ACETAMINOPHEN 500 MG PO TABS: 1000.0000 mg | ORAL_TABLET | Freq: Once | ORAL | Status: DC

## 2021-03-19 MED ORDER — DEXAMETHASONE SODIUM PHOSPHATE 10 MG/ML IJ SOLN
INTRAMUSCULAR | Status: AC
  Filled 2021-03-19: qty 1

## 2021-03-19 MED ORDER — ONDANSETRON HCL 4 MG/2ML IJ SOLN
INTRAMUSCULAR | Status: DC | PRN
  Administered 2021-03-19: 4 mg via INTRAVENOUS

## 2021-03-19 MED ORDER — ONDANSETRON HCL 4 MG/2ML IJ SOLN
INTRAMUSCULAR | Status: AC
  Filled 2021-03-19: qty 2

## 2021-03-19 SURGICAL SUPPLY — 15 items
BAG DRN RND TRDRP ANRFLXCHMBR (UROLOGICAL SUPPLIES) ×1
BAG URINE DRAIN 2000ML AR STRL (UROLOGICAL SUPPLIES) ×1 IMPLANT
BAG URO CATCHER STRL LF (MISCELLANEOUS) ×3 IMPLANT
CATH FOLEY 2WAY SLVR  5CC 16FR (CATHETERS) ×2
CATH FOLEY 2WAY SLVR 5CC 16FR (CATHETERS) IMPLANT
DRAPE FOOT SWITCH (DRAPES) ×3 IMPLANT
GLOVE SURG ENC TEXT LTX SZ7.5 (GLOVE) ×3 IMPLANT
GOWN STRL REUS W/TWL XL LVL3 (GOWN DISPOSABLE) ×4 IMPLANT
KIT TURNOVER KIT A (KITS) IMPLANT
LOOP CUT BIPOLAR 24F LRG (ELECTROSURGICAL) ×1 IMPLANT
MANIFOLD NEPTUNE II (INSTRUMENTS) ×3 IMPLANT
PACK CYSTO (CUSTOM PROCEDURE TRAY) ×3 IMPLANT
TUBING CONNECTING 10 (TUBING) ×3 IMPLANT
TUBING UROLOGY SET (TUBING) ×3 IMPLANT
WATER STERILE IRR 500ML POUR (IV SOLUTION) ×1 IMPLANT

## 2021-03-19 NOTE — Anesthesia Preprocedure Evaluation (Addendum)
Anesthesia Evaluation  Patient identified by MRN, date of birth, ID band Patient awake    Reviewed: Allergy & Precautions, NPO status , Patient's Chart, lab work & pertinent test results  History of Anesthesia Complications Negative for: history of anesthetic complications  Airway Mallampati: I  TM Distance: >3 FB Neck ROM: Full    Dental  (+) Edentulous Upper, Edentulous Lower   Pulmonary COPD, Current Smoker,    breath sounds clear to auscultation       Cardiovascular hypertension, Pt. on medications and Pt. on home beta blockers (-) angina+ CAD (non-obstructive) and +CHF (dilated, non-ischemic cardiomyopathy)  + dysrhythmias + Cardiac Defibrillator  Rhythm:Regular Rate:Normal  '20 ECHO: Left Ventricle: The left ventricle has normal systolic function, EF 51-10%. The cavity size was normal, no increase in left ventricular wall thickness. No significant valvular abnormalities.    Neuro/Psych Chronic back pain: spinal cord stim scoliosis    GI/Hepatic negative GI ROS, Neg liver ROS,   Endo/Other  negative endocrine ROS  Renal/GU negative Renal ROS   Bladder cancer    Musculoskeletal   Abdominal   Peds  Hematology negative hematology ROS (+)   Anesthesia Other Findings   Reproductive/Obstetrics                            Anesthesia Physical Anesthesia Plan  ASA: 3  Anesthesia Plan: General   Post-op Pain Management: Minimal or no pain anticipated and Tylenol PO (pre-op)   Induction:   PONV Risk Score and Plan: 2 and Ondansetron and Dexamethasone  Airway Management Planned: LMA  Additional Equipment: None  Intra-op Plan:   Post-operative Plan:   Informed Consent: I have reviewed the patients History and Physical, chart, labs and discussed the procedure including the risks, benefits and alternatives for the proposed anesthesia with the patient or authorized representative who  has indicated his/her understanding and acceptance.       Plan Discussed with: CRNA and Surgeon  Anesthesia Plan Comments:        Anesthesia Quick Evaluation

## 2021-03-19 NOTE — Op Note (Addendum)
Preoperative diagnosis: Bladder cancer Postoperative diagnosis: Same  Procedure: 1) TURBT 2 - 5 cm, 2) post-operative instillation of gemcitabine in PACU   Surgeon: Junious Silk  Anesthesia: General  Indication for procedure: [name] is an 80 year old female with a history of high-grade TA bladder cancer and frequent recurrences.  She was noted to have some small recurrences on October 2022 office cystoscopy and was brought today for above procedure.  Findings: Multiple superficial papillary tumors of the posterior wall and a few more on the dome.  On bimanual exam there were no vulvar masses, and no palpable bladder mass.  Description of procedure: After consent was obtained patient brought to the operating room.  After adequate anesthesia she was placed in lithotomy position and prepped and draped in the usual sterile fashion.  Timeout was performed to confirm the patient and procedure.  The cystoscope was passed per urethra and the bladder carefully inspected.  I then passed a loop and the continuous-flow sheath.  I used the loop mainly to ablate and scrape tumors off her bladder wall as her bladder is very thin and the angle was unfavorable to remove many of the small tumors.  On a couple of the larger ones they were resected and sent to pathology.  All visible tumor was ablated and fulgurated.  Largest area was about 5 cm posteriorly.  Hemostasis was excellent at low pressure.  Bladder inspected 1 last time and again no other tumors were noted.  The scope was removed and a 16 [place] Foley catheter placed.  Exam under anesthesia was performed.  She was awakened taken the recovery room in stable condition.  Postoperative instillation of gemcitabine: Gemcitabine 2000 mg was instilled per the Foley catheter and left indwelling in the bladder for 50 minutes.  The bladder was then drained and the catheter removed.  Complications: None  Blood loss: Minimal  Specimens: None  Drains: 16 [place] Foley  to be removed.

## 2021-03-19 NOTE — Anesthesia Postprocedure Evaluation (Signed)
Anesthesia Post Note  Patient: [name]  Procedure(s) Performed: CYSTOSCOPY WITH TRANSURETHRAL RESECTION OF BLADDER TUMOR  GREATER THAN 5CM WITH POST-OPERATIVE INSTILLATION OF EPIRUBICIN     Patient location during evaluation: PACU Anesthesia Type: General Level of consciousness: awake and alert, patient cooperative and oriented Pain management: pain level controlled Vital Signs Assessment: post-procedure vital signs reviewed and stable Respiratory status: spontaneous breathing, nonlabored ventilation and respiratory function stable Cardiovascular status: blood pressure returned to baseline and stable Postop Assessment: no apparent nausea or vomiting Anesthetic complications: no   No notable events documented.  Last Vitals:  Vitals:   03/19/21 1500 03/19/21 1515  BP: (!) 159/105 (!) 144/98  Pulse: 71 73  Resp: 12 12  Temp:    SpO2: 100% 93%    Last Pain:  Vitals:   03/19/21 1515  TempSrc:   PainSc: 0-No pain                 Pawan Knechtel,E. Cendy Oconnor

## 2021-03-19 NOTE — Transfer of Care (Signed)
Immediate Anesthesia Transfer of Care Note  Patient: [name]  Procedure(s) Performed: CYSTOSCOPY WITH TRANSURETHRAL RESECTION OF BLADDER TUMOR  GREATER THAN 5CM WITH POST-OPERATIVE INSTILLATION OF EPIRUBICIN  Patient Location: PACU  Anesthesia Type:General  Level of Consciousness: awake, alert  and oriented  Airway & Oxygen Therapy: Patient Spontanous Breathing and Patient connected to face mask oxygen  Post-op Assessment: Report given to RN and Post -op Vital signs reviewed and stable  Post vital signs: Reviewed and stable  Last Vitals:  Vitals Value Taken Time  BP 158/99 03/19/21 1450  Temp    Pulse 70 03/19/21 1450  Resp 12 03/19/21 1450  SpO2 100 % 03/19/21 1450    Last Pain:  Vitals:   03/19/21 1225  TempSrc:   PainSc: 4          Complications: No notable events documented.

## 2021-03-19 NOTE — H&P (Signed)
H&P  Chief Complaint: bladder cancer   History of Present Illness: [name] is an 80 year old female with a history of high-grade TA bladder cancer.  This was first noted 1995 and she had a significant recurrence in 2015.  When we were finally able to get that under control she has had multiple recurrences and failed BCG and intravesical chemo but absolutely would not proceed with cystectomy.  She was noted on recent office cystoscopy October 2022 to have some small recurrences.  Her urinalysis was clear with no bacteria.  She needed some maintenance work done on her pacemaker and is now ready for cystoscopy with bladder biopsy versus TUR and postoperative instillation of chemotherapy intravesically.  She is well without any dysuria or fever.  She has some frequency which is typical for her.  No gross hematuria either.  Past Medical History:  Diagnosis Date   AICD (automatic cardioverter/defibrillator) present EP cardiologist--- dr Caryl Comes    placement 05-07-2014 , ef 25%,  NICM/  (02-11-2019 last echo 08-31-2018 ef 60-65%)   Arthritis    "in about all my joints; for sure in my back"   Bladder cancer [facility]) urologist-  dr Junious Silk   dx 1995--  recurrent bladder cancer 2015 , s/p TURBT's and chemo instillation's ;   04/ 2019  s/p TURBT   Chronic hyponatremia    Chronic low back pain with bilateral sciatica    s/p  spinal cord stimulator @ [facility]  [id number]   Chronic systolic (congestive) heart failure [facility])    cardiologist-  dr Rogue Jury   COPD with emphysema ([place])    (02-11-2019  per pt has never been on oxygen)   Coronary artery disease cardiologist-  dr Kathlyn [place]   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% per cardiac cath 03-03-2014   DDD (degenerative disc disease), thoracolumbar    Frequent urination    Full dentures    Gait instability    due to chronic low back pain, uses roller walker   GERD (gastroesophageal reflux disease)    History of iron deficiency anemia 11/2013   resolved w/ IV Iron   (02-11-2019  per pt has not had any issues since 2015)   History of stomach ulcers 11/2013   Hypertension    LBBB (left bundle branch block)    NICM (nonischemic cardiomyopathy) ([place]) last echo 12-31-2015 ef 55-60%   dx 09/ 2015 per echo 20%;  myoview 09/ 2015 ef 28%;  per cardiac cath 12/ 2015 ef 30%;     Nocturia more than twice per night    S/P insertion of spinal cord stimulator followed by [facility]---- Dr Pecola Leisure (notes in care everywhere)   10-09-2018  @[facility] --- thoracic spinal cord stimular/ generator  (device from [facility])---- per pt has a control   Scoliosis    Uses walker 02/18/2021   all the time   Wears glasses    for reading   Past Surgical History:  Procedure Laterality Date   BI-VENTRICULAR IMPLANTABLE CARDIOVERTER DEFIBRILLATOR N/A 05/07/2014   Procedure: BI-VENTRICULAR IMPLANTABLE CARDIOVERTER DEFIBRILLATOR  (CRT-D);  Surgeon: Deboraha Sprang, MD;  Location: [facility] CATH LAB;  Service: Cardiovascular;  Laterality: N/A;   CARDIAC CATHETERIZATION  03-03-2014  dr Kathlyn [place]   [facility]   pLAD 20%, pRCA 20%, dRCA 50%, RPLS 50%;  ef 30%, mild elevated LVEDP, mild gradient across aortic valve LVOT   CARDIOVASCULAR STRESS TEST  11/25/2013   High risk nuclear study w/ large high severity inferior wall perfusion defect on stress and rest  images, large mild severity anteroseptal wall perfusion defect on stress and rest images, No inducible ischemia/ global moderate hypokinesis, ef 28%   CATARACT EXTRACTION W/ INTRAOCULAR LENS  IMPLANT, BILATERAL Bilateral right 12-2013 / left  02-2014   colonscopy  11/2020   benign polyps removed   CYSTOSCOPY W/ RETROGRADES Bilateral 01/06/2015   Procedure: CYSTOSCOPY WITH  BLADDER BIOPSY BILATERAL RETROGRADE PYELOGRAM,INSTILLATION OF MITOMYCIN C;  Surgeon: [name], MD;  Location: [facility];  Service: Urology;  Laterality: Bilateral;   CYSTOSCOPY WITH BIOPSY N/A 10/06/2015   Procedure: CYSTO WITH BLADDER BIOPSY, FULGERATION, CHEMO IRRIGATION  EPIRUBICIN IN PACU;  Surgeon: [name], MD;  Location: [facility];  Service: Urology;  Laterality: N/A;   CYSTOSCOPY WITH BIOPSY N/A 02/12/2019   Procedure: CYSTOSCOPY WITH BIOPSY/ FULGURATION/ INSTILLATION OF GEMCITABINE, bilateral retrograde turbt greater 5cm;  Surgeon: [name], MD;  Location: [facility];  Service: Urology;  Laterality: N/A;   CYSTOSCOPY WITH FULGERATION N/A 06/30/2017   Procedure: Marland Kitchen CYSTOSCOPY WITH [place] FULGERATION/ BLADDER BIOPSY/ INSTILLATION OF EPIRUBICIN;  Surgeon: [name], MD;  Location: [facility];  Service: Urology;  Laterality: N/A;   CYSTOSCOPY WITH RETROGRADE PYELOGRAM, URETEROSCOPY AND STENT PLACEMENT Bilateral 04/18/2014   Procedure: CYSTOSCOPY WITH RETROGRADE PYELOGRAM;  Surgeon: [name], MD;  Location: [facility];  Service: Urology;  Laterality: Bilateral;   ESOPHAGOGASTRODUODENOSCOPY N/A 11/28/2013   Procedure: ESOPHAGOGASTRODUODENOSCOPY (EGD);  Surgeon: Arta Silence, MD;  Location: [facility] ENDOSCOPY;  Service: Endoscopy;  Laterality: N/A;   LUMBAR DISC SURGERY  1980's   "ruptured disc"   SPINAL CORD STIMULATOR IMPLANT  10-09-2018   @[facility]    Thoracic spinal cord stimular/ genertor  (left flank)----- (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[employer])   TRANSTHORACIC ECHOCARDIOGRAM  12/31/2015   dr Caryl Comes   ef 26-834, grade 1 diastolic dysfunction/ mild MR/ septal motion showed abnormal function and dyssynergy   TRANSURETHRAL RESECTION OF BLADDER  1995   TRANSURETHRAL RESECTION OF BLADDER TUMOR N/A 12/13/2013   Procedure: TRANSURETHRAL RESECTION OF BLADDER TUMOR (TURBT);  Surgeon: [name], MD;  Location: [facility];  Service: Urology;  Laterality: N/A;   TRANSURETHRAL RESECTION OF BLADDER TUMOR N/A 01/17/2014   Procedure: TRANSURETHRAL RESECTION OF BLADDER TUMOR (TURBT);  Surgeon: [name], MD;  Location: [facility];  Service: Urology;  Laterality: N/A;   TRANSURETHRAL RESECTION OF BLADDER TUMOR N/A 04/18/2014    Procedure: TRANSURETHRAL RESECTION OF BLADDER TUMOR (TURBT), CYSTOGRAM;  Surgeon: [name], MD;  Location: [facility];  Service: Urology;  Laterality: N/A;   TRANSURETHRAL RESECTION OF BLADDER TUMOR N/A 12/13/2019   Procedure: TRANSURETHRAL RESECTION OF BLADDER TUMOR (TURBT) GREATER THAN 5CM WITH CYSTOSCOPY/ POST OPERATIVE INSTILLATION OF GEMCITABINE;  Surgeon: [name], MD;  [facility];  Service: Urology;  Laterality: N/A;   TRANSURETHRAL RESECTION OF BLADDER TUMOR N/A 10/06/2020   Procedure: TRANSURETHRAL RESECTION OF BLADDER TUMOR (TURBT)/ CYSTOSCOPY/  BLADDER BIOPSY;  Surgeon: [name], MD;  Location: [facility];  Service: Urology;  Laterality: N/A;    Home Medications:  Medications Prior to Admission  Medication Sig Dispense Refill Last Dose   acetaminophen (TYLENOL) 500 MG tablet Take 1,000 mg by mouth every 8 (eight) hours as needed for moderate pain.   03/19/2021 at 1100   amitriptyline (ELAVIL) 10 MG tablet Take 2 tablets (20 mg total) by mouth at bedtime. (Patient taking differently: Take 10-20 mg by mouth at bedtime.) 60 tablet 3 Past Week   amLODipine (NORVASC) 2.5 MG tablet TAKE 1 TABLET(2.5 MG) BY MOUTH DAILY MUST KEEP  UPCOMING APPT FOR FURTHER REFILLS. THANK YOU! (Patient taking differently: daily. TAKE 1 TABLET(2.5 MG) BY MOUTH DAILY MUST KEEP UPCOMING APPT FOR FURTHER REFILLS. THANK YOU!) 90 tablet 0 03/19/2021 at 0900   gabapentin (NEURONTIN) 300 MG capsule Take 1-2 capsules (300-600 mg total) by mouth 3 (three) times daily. (Patient taking differently: Take 300-600 mg by mouth 3 (three) times daily as needed (pain).) 90 capsule 3 03/18/2021 at 2300   losartan (COZAAR) 100 MG tablet TAKE 1 TABLET(100 MG) BY MOUTH AT BEDTIME 90 tablet 1 03/18/2021 at 2300   Menthol, Topical Analgesic, (BIOFREEZE EX) Apply 1 application topically 4 (four) times daily as needed (back pain).   03/18/2021 at 1800   metoprolol succinate (TOPROL-XL) 25 MG 24 hr  tablet TAKE 1 TABLET(25 MG) BY MOUTH DAILY 90 tablet 3 03/19/2021 at 0900   oxyCODONE-acetaminophen (PERCOCET) 10-325 MG tablet Take 1 tablet by mouth every 4 (four) hours as needed for pain. 150 tablet 0 03/19/2021 at 1100   [START ON 04/09/2021] oxyCODONE-acetaminophen (PERCOCET) 10-325 MG tablet Take 1 tablet by mouth every 4 (four) hours as needed for pain. 150 tablet 0    pantoprazole (PROTONIX) 40 MG tablet Take 40 mg by mouth at bedtime.   03/18/2021 at 2300   polyethylene glycol (MIRALAX / GLYCOLAX) packet Take 17 g by mouth daily as needed for mild constipation.    Past Week   promethazine (PHENERGAN) 12.5 MG tablet Take 1 tablet (12.5 mg total) by mouth every 8 (eight) hours as needed for nausea or vomiting. 30 tablet 2 Past Week   rosuvastatin (CRESTOR) 5 MG tablet TAKE 1 TABLET(5 MG) BY MOUTH DAILY (Patient taking differently: Take 5 mg by mouth at bedtime.) 90 tablet 0 03/18/2021 at 2300   sucralfate (CARAFATE) 1 g tablet TAKE 1 TABLET BY MOUTH ONE HOUR BEFORE EACH MEAL AND 2 HOURS AFTER OTHER MEDICATIONS (Patient taking differently: Take 1 g by mouth See admin instructions. TAKE 1 TABLET BY MOUTH ONE HOUR BEFORE EACH MEAL AND 2 HOURS AFTER OTHER MEDICATIONS) 90 tablet 1 Past Week   Allergies: No Known Allergies  Family History  Problem Relation Age of Onset   Cancer Mother    Cancer Brother    Social History:  reports that she has been smoking cigarettes. She has a 14.00 pack-year smoking history. She has never used smokeless tobacco. She reports that she does not drink alcohol and does not use drugs.  ROS: A complete review of systems was performed.  All systems are negative except for pertinent findings as noted. Review of Systems  All other systems reviewed and are negative.   Physical Exam:  Vital signs in last 24 hours: Temp:  [98.4 F (36.9 C)] 98.4 F (36.9 C) (12/30 1159) Pulse Rate:  [68] 68 (12/30 1159) Resp:  [18] 18 (12/30 1159) BP: (154)/(87) 154/87 (12/30  1159) SpO2:  [96 %] 96 % (12/30 1159) Weight:  [54.4 kg] 54.4 kg (12/30 1225) General:  Alert and oriented, No acute distress HEENT: Normocephalic, atraumatic Cardiovascular: Regular rate and rhythm Lungs: Regular rate and effort Abdomen: Soft, nontender, nondistended, no abdominal masses Back: No CVA tenderness Extremities: No edema Neurologic: Grossly intact  Laboratory Data:  No results found for this or any previous visit (from the past 24 hour(s)). No results found for this or any previous visit (from the past 240 hour(s)). Creatinine: No results for input(s): CREATININE in the last 168 hours.  Impression/Assessment:  Bladder cancer-  Plan:  I discussed with the patient the  nature, potential benefits, risks and alternatives to cystoscopy, bladder biopsy or TURBT, postoperative instillation of intravesical chemotherapy, including side effects of the proposed treatment, the likelihood of the patient achieving the goals of the procedure, and any potential problems that might occur during the procedure or recuperation. All questions answered. Patient elects to proceed.    [name] 03/19/2021, 1:38 PM

## 2021-03-19 NOTE — Anesthesia Procedure Notes (Signed)
Procedure Name: LMA Insertion Date/Time: 03/19/2021 2:05 PM Performed by: Kynadie Yaun D, CRNA Pre-anesthesia Checklist: Patient identified, Emergency Drugs available, Suction available and Patient being monitored Patient Re-evaluated:Patient Re-evaluated prior to induction Oxygen Delivery Method: Circle system utilized Preoxygenation: Pre-oxygenation with 100% oxygen Induction Type: IV induction Ventilation: Mask ventilation without difficulty LMA: LMA inserted LMA Size: 4.0 Tube type: Oral Number of attempts: 1 Placement Confirmation: positive ETCO2 and breath sounds checked- equal and bilateral Tube secured with: Tape Dental Injury: Teeth and Oropharynx as per pre-operative assessment

## 2021-03-20 ENCOUNTER — Encounter (HOSPITAL_COMMUNITY): Payer: Self-pay | Admitting: Urology

## 2021-03-23 LAB — SURGICAL PATHOLOGY

## 2021-03-24 ENCOUNTER — Other Ambulatory Visit: Payer: Self-pay | Admitting: Internal Medicine

## 2021-04-07 ENCOUNTER — Other Ambulatory Visit: Payer: Self-pay | Admitting: Student in an Organized Health Care Education/Training Program

## 2021-04-07 DIAGNOSIS — M47816 Spondylosis without myelopathy or radiculopathy, lumbar region: Secondary | ICD-10-CM

## 2021-04-07 DIAGNOSIS — G894 Chronic pain syndrome: Secondary | ICD-10-CM

## 2021-04-07 DIAGNOSIS — M5416 Radiculopathy, lumbar region: Secondary | ICD-10-CM

## 2021-04-13 ENCOUNTER — Telehealth: Payer: Self-pay | Admitting: Internal Medicine

## 2021-04-13 NOTE — Telephone Encounter (Signed)
Patient needed verbal assistance with sending in remote transmission. Call transferred to [place].

## 2021-04-13 NOTE — Telephone Encounter (Signed)
I called [employer] tech support to get additional help. She will be getting her handheld replaced. She should receive it in 7-10 business days. I told the patient once she receive the handheld and she has any questions to give [place] a call.

## 2021-04-21 ENCOUNTER — Encounter: Payer: Self-pay | Admitting: Student in an Organized Health Care Education/Training Program

## 2021-04-22 ENCOUNTER — Telehealth: Payer: Self-pay | Admitting: Student in an Organized Health Care Education/Training Program

## 2021-04-22 NOTE — Telephone Encounter (Signed)
Patient is getting ready to do drug holiday, she has some questions please call her.

## 2021-04-23 ENCOUNTER — Other Ambulatory Visit: Payer: Self-pay | Admitting: *Deleted

## 2021-04-23 MED ORDER — AMLODIPINE BESYLATE 2.5 MG PO TABS: 2.5000 mg | ORAL_TABLET | Freq: Every day | ORAL | 0 refills | Status: DC

## 2021-04-26 NOTE — Progress Notes (Deleted)
Cardiology Office Note Date:  04/26/2021  Patient ID:  [name], [name] [birth year], MRN [id number] PCP:  [name], MD  Cardiologist:  Dr. Fletcher Anon Electrophysiologist: Dr. Caryl Comes  ***refresh   Chief Complaint: *** 6 mo  History of Present Illness: [name] is a 81 y.o. female with history of COPD, CAD (mild Non-obstructive by cath 2015), NICM, LBBB, bladder cancer, chronic CHF (systolic)  She come sin today to be seen for Dr. Caryl Comes, last seen by him *** Most recently for EP she saw A. Tillery, PA-C 02/25/21, cardiac wise seemed to be doing OK, was pending a TURP for her bladder cancer, felt to be an acceptable risk, provided device guidance.  Planned for 6 mo visit  She has had recover of her LVEF by her most recent echo in 2020  *** symptoms *** volume *** meds, CM *** BP %   Device information MDT CRT-D implanted  05/07/2014   Past Medical History:  Diagnosis Date   AICD (automatic cardioverter/defibrillator) present EP cardiologist--- dr Caryl Comes    placement 05-07-2014 , ef 25%,  NICM/  (02-11-2019 last echo 08-31-2018 ef 60-65%)   Arthritis    "in about all my joints; for sure in my back"   Bladder cancer [facility]) urologist-  dr Junious Silk   dx 1995--  recurrent bladder cancer 2015 , s/p TURBT's and chemo instillation's ;   04/ 2019  s/p TURBT   Chronic hyponatremia    Chronic low back pain with bilateral sciatica    s/p  spinal cord stimulator @ [facility]  [phone]   Chronic systolic (congestive) heart failure [facility])    cardiologist-  dr Rogue Jury   COPD with emphysema ([place])    (02-11-2019  per pt has never been on oxygen)   Coronary artery disease cardiologist-  dr Kathlyn [place]   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% per cardiac cath 03-03-2014   DDD (degenerative disc disease), thoracolumbar    Frequent urination    Full dentures    Gait instability    due to chronic low back pain, uses roller walker   GERD (gastroesophageal reflux disease)    History of iron  deficiency anemia 11/2013   resolved w/ IV Iron  (02-11-2019  per pt has not had any issues since 2015)   History of stomach ulcers 11/2013   Hypertension    LBBB (left bundle branch block)    NICM (nonischemic cardiomyopathy) ([place]) last echo 12-31-2015 ef 55-60%   dx 09/ 2015 per echo 20%;  myoview 09/ 2015 ef 28%;  per cardiac cath 12/ 2015 ef 30%;     Nocturia more than twice per night    S/P insertion of spinal cord stimulator followed by [facility]---- Dr Pecola Leisure (notes in care everywhere)   10-09-2018  @[facility] --- thoracic spinal cord stimular/ generator  (device from [facility])---- per pt has a control   Scoliosis    Uses walker 02/18/2021   all the time   Wears glasses    for reading    Past Surgical History:  Procedure Laterality Date   BI-VENTRICULAR IMPLANTABLE CARDIOVERTER DEFIBRILLATOR N/A 05/07/2014   Procedure: BI-VENTRICULAR IMPLANTABLE CARDIOVERTER DEFIBRILLATOR  (CRT-D);  Surgeon: Deboraha Sprang, MD;  Location: [facility] CATH LAB;  Service: Cardiovascular;  Laterality: N/A;   CARDIAC CATHETERIZATION  03-03-2014  dr Kathlyn [place]   [facility]   pLAD 20%, pRCA 20%, dRCA 50%, RPLS 50%;  ef 30%, mild elevated LVEDP, mild gradient across aortic valve LVOT   CARDIOVASCULAR  STRESS TEST  11/25/2013   High risk nuclear study w/ large high severity inferior wall perfusion defect on stress and rest images, large mild severity anteroseptal wall perfusion defect on stress and rest images, No inducible ischemia/ global moderate hypokinesis, ef 28%   CATARACT EXTRACTION W/ INTRAOCULAR LENS  IMPLANT, BILATERAL Bilateral right 12-2013 / left  02-2014   colonscopy  11/2020   benign polyps removed   CYSTOSCOPY W/ RETROGRADES Bilateral 01/06/2015   Procedure: CYSTOSCOPY WITH  BLADDER BIOPSY BILATERAL RETROGRADE PYELOGRAM,INSTILLATION OF MITOMYCIN C;  Surgeon: [name], MD;  Location: [facility];  Service: Urology;  Laterality: Bilateral;   CYSTOSCOPY WITH BIOPSY N/A 10/06/2015   Procedure: CYSTO  WITH BLADDER BIOPSY, FULGERATION, CHEMO IRRIGATION EPIRUBICIN IN PACU;  Surgeon: [name], MD;  Location: [facility];  Service: Urology;  Laterality: N/A;   CYSTOSCOPY WITH BIOPSY N/A 02/12/2019   Procedure: CYSTOSCOPY WITH BIOPSY/ FULGURATION/ INSTILLATION OF GEMCITABINE, bilateral retrograde turbt greater 5cm;  Surgeon: [name], MD;  Location: [facility];  Service: Urology;  Laterality: N/A;   CYSTOSCOPY WITH BIOPSY N/A 03/19/2021   Procedure: CYSTOSCOPY WITH TRANSURETHRAL RESECTION OF BLADDER TUMOR  GREATER THAN 5CM WITH POST-OPERATIVE INSTILLATION OF EPIRUBICIN;  Surgeon: [name], MD;  Location: [facility];  Service: Urology;  Laterality: N/A;   CYSTOSCOPY WITH FULGERATION N/A 06/30/2017   Procedure: Marland Kitchen CYSTOSCOPY WITH [place] FULGERATION/ BLADDER BIOPSY/ INSTILLATION OF EPIRUBICIN;  Surgeon: [name], MD;  Location: [facility];  Service: Urology;  Laterality: N/A;   CYSTOSCOPY WITH RETROGRADE PYELOGRAM, URETEROSCOPY AND STENT PLACEMENT Bilateral 04/18/2014   Procedure: CYSTOSCOPY WITH RETROGRADE PYELOGRAM;  Surgeon: [name], MD;  Location: [facility];  Service: Urology;  Laterality: Bilateral;   ESOPHAGOGASTRODUODENOSCOPY N/A 11/28/2013   Procedure: ESOPHAGOGASTRODUODENOSCOPY (EGD);  Surgeon: Arta Silence, MD;  Location: [facility] ENDOSCOPY;  Service: Endoscopy;  Laterality: N/A;   LUMBAR DISC SURGERY  1980's   "ruptured disc"   SPINAL CORD STIMULATOR IMPLANT  10-09-2018   @[facility]    Thoracic spinal cord stimular/ genertor  (left flank)----- (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[employer])   TRANSTHORACIC ECHOCARDIOGRAM  12/31/2015   dr Caryl Comes   ef 08-144, grade 1 diastolic dysfunction/ mild MR/ septal motion showed abnormal function and dyssynergy   TRANSURETHRAL RESECTION OF BLADDER  1995   TRANSURETHRAL RESECTION OF BLADDER TUMOR N/A 12/13/2013   Procedure: TRANSURETHRAL RESECTION OF BLADDER TUMOR (TURBT);  Surgeon: [name], MD;  Location:  [facility];  Service: Urology;  Laterality: N/A;   TRANSURETHRAL RESECTION OF BLADDER TUMOR N/A 01/17/2014   Procedure: TRANSURETHRAL RESECTION OF BLADDER TUMOR (TURBT);  Surgeon: [name], MD;  Location: [facility];  Service: Urology;  Laterality: N/A;   TRANSURETHRAL RESECTION OF BLADDER TUMOR N/A 04/18/2014   Procedure: TRANSURETHRAL RESECTION OF BLADDER TUMOR (TURBT), CYSTOGRAM;  Surgeon: [name], MD;  Location: [facility];  Service: Urology;  Laterality: N/A;   TRANSURETHRAL RESECTION OF BLADDER TUMOR N/A 12/13/2019   Procedure: TRANSURETHRAL RESECTION OF BLADDER TUMOR (TURBT) GREATER THAN 5CM WITH CYSTOSCOPY/ POST OPERATIVE INSTILLATION OF GEMCITABINE;  Surgeon: [name], MD;  Location: [facility];  Service: Urology;  Laterality: N/A;   TRANSURETHRAL RESECTION OF BLADDER TUMOR N/A 10/06/2020   Procedure: TRANSURETHRAL RESECTION OF BLADDER TUMOR (TURBT)/ CYSTOSCOPY/  BLADDER BIOPSY;  Surgeon: [name], MD;  Location: [facility];  Service: Urology;  Laterality: N/A;    Current Outpatient Medications  Medication Sig Dispense Refill   acetaminophen (TYLENOL) 500 MG tablet Take 1,000 mg by mouth every 8 (eight) hours as  needed for moderate pain.     amitriptyline (ELAVIL) 10 MG tablet Take 2 tablets (20 mg total) by mouth at bedtime. (Patient taking differently: Take 10-20 mg by mouth at bedtime.) 60 tablet 3   amLODipine (NORVASC) 2.5 MG tablet Take 1 tablet (2.5 mg total) by mouth daily. TAKE 1 TABLET(2.5 MG) BY MOUTH DAILY MUST KEEP UPCOMING APPT FOR FURTHER REFILLS. THANK YOU! 30 tablet 0   gabapentin (NEURONTIN) 300 MG capsule Take 1-2 capsules (300-600 mg total) by mouth 3 (three) times daily. (Patient taking differently: Take 300-600 mg by mouth 3 (three) times daily as needed (pain).) 90 capsule 3   losartan (COZAAR) 100 MG tablet TAKE 1 TABLET(100 MG) BY MOUTH AT BEDTIME 90 tablet 1   Menthol, Topical Analgesic, (BIOFREEZE EX) Apply 1 application  topically 4 (four) times daily as needed (back pain).     metoprolol succinate (TOPROL-XL) 25 MG 24 hr tablet TAKE 1 TABLET(25 MG) BY MOUTH DAILY 90 tablet 3   oxyCODONE-acetaminophen (PERCOCET) 10-325 MG tablet Take 1 tablet by mouth every 4 (four) hours as needed for pain. 150 tablet 0   pantoprazole (PROTONIX) 40 MG tablet Take 40 mg by mouth at bedtime.     polyethylene glycol (MIRALAX / GLYCOLAX) packet Take 17 g by mouth daily as needed for mild constipation.      promethazine (PHENERGAN) 12.5 MG tablet Take 1 tablet (12.5 mg total) by mouth every 8 (eight) hours as needed for nausea or vomiting. 30 tablet 2   rosuvastatin (CRESTOR) 5 MG tablet TAKE 1 TABLET(5 MG) BY MOUTH DAILY (Patient taking differently: Take 5 mg by mouth at bedtime.) 90 tablet 0   sucralfate (CARAFATE) 1 g tablet TAKE 1 TABLET BY MOUTH ONE HOUR BEFORE EACH MEAL AND 2 HOURS AFTER OTHER MEDICATIONS 90 tablet 1   No current facility-administered medications for this visit.   Facility-Administered Medications Ordered in Other Visits  Medication Dose Route Frequency Provider Last Rate Last Admin   epirubicin (ELLENCE) 50 mg in sodium chloride 0.9 % bladder instillation  50 mg Bladder Instillation Once [name], MD       epirubicin (ELLENCE) 80 mg in sodium chloride (PF) 0.9 % bladder instillation  80 mg Bladder Instillation Once Nyoka Cowden, Terri L, RPH       gemcitabine (GEMZAR) chemo syringe for bladder instillation 2,000 mg  2,000 mg Bladder Instillation Once [name], MD       gemcitabine [facility]) chemo syringe for bladder instillation 2,000 mg  2,000 mg Bladder Instillation Once [name], MD        Allergies:   Patient has no known allergies.   Social History:  The patient  reports that she has been smoking cigarettes. She has a 14.00 pack-year smoking history. She has never used smokeless tobacco. She reports that she does not drink alcohol and does not use drugs.   Family History:  The  patient's family history includes Cancer in her brother and mother.***  ROS:  Please see the history of present illness.    All other systems are reviewed and otherwise negative.   PHYSICAL EXAM:  VS:  There were no vitals taken for this visit. BMI: There is no height or weight on file to calculate BMI. Well nourished, well developed, in no acute distress HEENT: normocephalic, atraumatic Neck: no JVD, carotid bruits or masses Cardiac:  *** RRR; no significant murmurs, no rubs, or gallops Lungs:  *** CTA b/l, no wheezing, rhonchi or rales Abd: soft, nontender MS: no  deformity or *** atrophy Ext: *** no edema Skin: warm and dry, no rash Neuro:  No gross deficits appreciated Psych: euthymic mood, full affect  *** ICD site is stable, no tethering or discomfort   EKG:  not done today  Device interrogation done today and reviewed by myself:  ***    08/31/2018: TTE  1. The left ventricle has normal systolic function with an ejection  fraction of 60-65%. The cavity size was normal. Left ventricular diastolic  Doppler parameters are consistent with impaired relaxation.   2. The right ventricle has normal systolic function. The cavity was  normal. There is no increase in right ventricular wall thickness.Normal  RVSP   3. PVCs noted    11/24/2013: TTE Study Conclusions  - Left ventricle: The cavity size was mildly dilated. Wall    thickness was normal. Systolic function was severely reduced. The    estimated ejection fraction was in the range of 20% to 25%.    Diffuse hypokinesis. Doppler parameters are consistent with    abnormal left ventricular relaxation (grade 1 diastolic    dysfunction).  - Mitral valve: There was mild regurgitation.  - Left atrium: The atrium was severely dilated.  - Pulmonary arteries: Systolic pressure was mildly increased. PA    peak pressure: 41 mm Hg (S).  - Pericardium, extracardiac: A small pericardial effusion was    identified.   Impressions:   - Severe global reduction in LV function; grade 1 diastolic    dysfunction; mild MR and TR; mildly elevated pulmonary pressures;    small pericardial effusion.   Recent Labs: 12/09/2020: ALT 25 03/05/2021: BUN 13; Creatinine, Ser 0.53; Hemoglobin 13.5; Platelets 153; Potassium 4.9; Sodium 131  No results found for requested labs within last 8760 hours.   CrCl cannot be calculated (Patient's most recent lab result is older than the maximum 21 days allowed.).   Wt Readings from Last 3 Encounters:  03/19/21 119 lb 14.9 oz (54.4 kg)  03/05/21 120 lb (54.4 kg)  02/25/21 118 lb 9.6 oz (53.8 kg)     Other studies reviewed: Additional studies/records reviewed today include: summarized above  ASSESSMENT AND PLAN:  ICD ***  NICM Recovered LVEF by her echo in 2020 ***   Disposition: F/u with ***  Current medicines are reviewed at length with the patient today.  The patient did not have any concerns regarding medicines.  Venetia Night, PA-C 04/26/2021 12:46 PM     [address] [place] [id number] [phone] (office)  [phone] (fax)

## 2021-04-29 ENCOUNTER — Encounter: Payer: Medicare Other | Admitting: Physician Assistant

## 2021-04-30 ENCOUNTER — Ambulatory Visit (INDEPENDENT_AMBULATORY_CARE_PROVIDER_SITE_OTHER): Payer: Medicare Other

## 2021-04-30 DIAGNOSIS — I428 Other cardiomyopathies: Secondary | ICD-10-CM

## 2021-05-01 LAB — CUP PACEART REMOTE DEVICE CHECK
Battery Remaining Longevity: 3 mo
Battery Voltage: 2.77 V
Brady Statistic AP VP Percent: 43.69 %
Brady Statistic AP VS Percent: 0.93 %
Brady Statistic AS VP Percent: 53.71 %
Brady Statistic AS VS Percent: 1.66 %
Brady Statistic RA Percent Paced: 43.39 %
Brady Statistic RV Percent Paced: 94.39 %
Date Time Interrogation Session: 20230209185154
HighPow Impedance: 73 Ohm
Implantable Lead Implant Date: 20160217
Implantable Lead Implant Date: 20160217
Implantable Lead Implant Date: 20160217
Implantable Lead Location: 753858
Implantable Lead Location: 753859
Implantable Lead Location: 753860
Implantable Lead Model: 4298
Implantable Lead Model: 5076
Implantable Pulse Generator Implant Date: 20160217
Lead Channel Impedance Value: 418 Ohm
Lead Channel Impedance Value: 418 Ohm
Lead Channel Impedance Value: 456 Ohm
Lead Channel Impedance Value: 475 Ohm
Lead Channel Impedance Value: 513 Ohm
Lead Channel Impedance Value: 513 Ohm
Lead Channel Impedance Value: 532 Ohm
Lead Channel Impedance Value: 551 Ohm
Lead Channel Impedance Value: 779 Ohm
Lead Channel Impedance Value: 779 Ohm
Lead Channel Impedance Value: 836 Ohm
Lead Channel Impedance Value: 874 Ohm
Lead Channel Impedance Value: 893 Ohm
Lead Channel Pacing Threshold Amplitude: 0.5 V
Lead Channel Pacing Threshold Amplitude: 0.875 V
Lead Channel Pacing Threshold Amplitude: 1.25 V
Lead Channel Pacing Threshold Pulse Width: 0.4 ms
Lead Channel Pacing Threshold Pulse Width: 0.4 ms
Lead Channel Pacing Threshold Pulse Width: 0.4 ms
Lead Channel Sensing Intrinsic Amplitude: 21.375 mV
Lead Channel Sensing Intrinsic Amplitude: 21.375 mV
Lead Channel Sensing Intrinsic Amplitude: 3.25 mV
Lead Channel Sensing Intrinsic Amplitude: 3.25 mV
Lead Channel Setting Pacing Amplitude: 2 V
Lead Channel Setting Pacing Amplitude: 2.25 V
Lead Channel Setting Pacing Amplitude: 2.5 V
Lead Channel Setting Pacing Pulse Width: 0.4 ms
Lead Channel Setting Pacing Pulse Width: 0.4 ms
Lead Channel Setting Sensing Sensitivity: 0.45 mV

## 2021-05-04 NOTE — Progress Notes (Signed)
Remote ICD transmission.   

## 2021-05-06 ENCOUNTER — Ambulatory Visit
Payer: Medicare Other | Attending: Student in an Organized Health Care Education/Training Program | Admitting: Student in an Organized Health Care Education/Training Program

## 2021-05-06 ENCOUNTER — Other Ambulatory Visit: Payer: Self-pay

## 2021-05-06 ENCOUNTER — Encounter: Payer: Self-pay | Admitting: Student in an Organized Health Care Education/Training Program

## 2021-05-06 VITALS — BP 149/86 | HR 70 | Temp 97.0°F | Resp 16 | Ht 68.0 in | Wt 120.0 lb

## 2021-05-06 DIAGNOSIS — M5416 Radiculopathy, lumbar region: Secondary | ICD-10-CM | POA: Diagnosis not present

## 2021-05-06 DIAGNOSIS — G894 Chronic pain syndrome: Secondary | ICD-10-CM | POA: Diagnosis not present

## 2021-05-06 DIAGNOSIS — M48062 Spinal stenosis, lumbar region with neurogenic claudication: Secondary | ICD-10-CM

## 2021-05-06 DIAGNOSIS — M51369 Other intervertebral disc degeneration, lumbar region without mention of lumbar back pain or lower extremity pain: Secondary | ICD-10-CM

## 2021-05-06 DIAGNOSIS — M5136 Other intervertebral disc degeneration, lumbar region: Secondary | ICD-10-CM | POA: Diagnosis not present

## 2021-05-06 DIAGNOSIS — M47818 Spondylosis without myelopathy or radiculopathy, sacral and sacrococcygeal region: Secondary | ICD-10-CM | POA: Insufficient documentation

## 2021-05-06 DIAGNOSIS — K6289 Other specified diseases of anus and rectum: Secondary | ICD-10-CM | POA: Diagnosis not present

## 2021-05-06 DIAGNOSIS — Z9689 Presence of other specified functional implants: Secondary | ICD-10-CM

## 2021-05-06 DIAGNOSIS — M461 Sacroiliitis, not elsewhere classified: Secondary | ICD-10-CM

## 2021-05-06 DIAGNOSIS — M47816 Spondylosis without myelopathy or radiculopathy, lumbar region: Secondary | ICD-10-CM

## 2021-05-06 MED ORDER — OXYCODONE-ACETAMINOPHEN 10-325 MG PO TABS: 1.0000 | ORAL_TABLET | ORAL | 0 refills | Status: DC | PRN

## 2021-05-06 MED ORDER — OXYCODONE-ACETAMINOPHEN 10-325 MG PO TABS: 1.0000 | ORAL_TABLET | ORAL | 0 refills | Status: AC | PRN

## 2021-05-06 MED ORDER — NONFORMULARY OR COMPOUNDED ITEM: 2 refills | Status: DC

## 2021-05-06 NOTE — Progress Notes (Signed)
Nursing Pain Medication Assessment:  Safety precautions to be maintained throughout the outpatient stay will include: orient to surroundings, keep bed in low position, maintain call bell within reach at all times, provide assistance with transfer out of bed and ambulation.  Medication Inspection Compliance: Pill count conducted under aseptic conditions, in front of the patient. Neither the pills nor the bottle was removed from the patient's sight at any time. Once count was completed pills were immediately returned to the patient in their original bottle.  Medication: Oxycodone/APAP Pill/Patch Count:  18 of 150 pills remain Pill/Patch Appearance: Markings consistent with prescribed medication Bottle Appearance: Standard pharmacy container. Clearly labeled. Filled Date: 01 / 20 / 2023 Last Medication intake:  Today

## 2021-05-06 NOTE — Progress Notes (Signed)
PROVIDER NOTE: Information contained herein reflects review and annotations entered in association with encounter. Interpretation of such information and data should be left to medically-trained personnel. Information provided to patient can be located elsewhere in the medical record under "Patient Instructions". Document created using STT-dictation technology, any transcriptional errors that may result from process are unintentional.    Patient: [name]  Service Category: E/M  Provider: [name], MD  DOB: 1940/04/10  DOS: 05/06/2021  Specialty: Interventional Pain Management  MRN: [id number]  Setting: Ambulatory outpatient  PCP: [name], MD  Type: Established Patient    Referring Provider: [name], MD  Location: Office  Delivery: Face-to-face     HPI  [name], a 81 y.o. year old female, is here today because of her Pain, rectal [K62.89]. [name] primary complain today is Back Pain (Lumbar bilateral ) and Rectal Pain  Last encounter: My last encounter with her was on 02/04/21  Pertinent problems: [name] has Spinal stenosis of thoracolumbar region; Spinal stenosis in cervical region; Spinal stenosis, lumbar region, with neurogenic claudication; Lumbar spondylosis; SI joint arthritis; Lumbar degenerative disc disease; Chronic pain syndrome; Lumbar radiculopathy; Lumbar facet arthropathy; and Back pain on their pertinent problem list. Pain Assessment: Severity of Chronic pain is reported as a 5  (pain med at noon)/10. Location: Back (rectal pain) Lower, Left, Right/back pain into hips and the backs of legs. Onset: More than a month ago. Quality: Discomfort, Constant, Dull. Timing: Constant. Modifying factor(s): medications, laying down. Vitals:  height is 5' 8"  (1.727 m) and weight is 120 lb (54.4 kg). Her temporal temperature is 97 F (36.1 C) (abnormal). Her blood pressure is 149/86 (abnormal) and her pulse is 70. Her respiration is 16 and oxygen saturation is  100%.   Reason for encounter: medication management.    Rectal pain persistent. Will send in topical cream. Patient continues multimodal pain regimen as prescribed.  States that it provides pain relief and improvement in functional status. Continues with spinal cord stimulation. Have discussed drug holiday for opioid induced hyperalgesia however patient is reluctant to do this given acute increase in pain she'll experience  Pharmacotherapy Assessment  Analgesic: Percocet 10 mg up to 5 times a day as needed for severe breakthrough pain, quantity 150/month; MME equals 75   Monitoring: [place] PMP: PDMP reviewed during this encounter.       Pharmacotherapy: No side-effects or adverse reactions reported. Compliance: No problems identified. Effectiveness: Clinically acceptable.  UDS:  Summary  Date Value Ref Range Status  08/11/2020 Note  Final    Comment:    ==================================================================== [employer] [facility] (MW) ==================================================================== Test                             Result       Flag       Units  Drug Present and Declared for Prescription Verification   Oxycodone                      4370         EXPECTED   ng/mg creat   Oxymorphone                    8753         EXPECTED   ng/mg creat   Noroxycodone  12500        EXPECTED   ng/mg creat   Noroxymorphone                 2957         EXPECTED   ng/mg creat    Sources of oxycodone are scheduled prescription medications.    Oxymorphone, noroxycodone, and noroxymorphone are expected    metabolites of oxycodone. Oxymorphone is also available as a    scheduled prescription medication.  ==================================================================== Test                      Result    Flag   Units      Ref Range   Creatinine              30               mg/dL       >=20 ==================================================================== Declared Medications:  The flagging and interpretation on this report are based on the  following declared medications.  Unexpected results may arise from  inaccuracies in the declared medications.   **Note: The testing scope of this panel includes these medications:   Oxycodone (Percocet)   **Note: The testing scope of this panel does not include the  following reported medications:   Acetaminophen  Acetaminophen (Percocet)  Amitriptyline  Amlodipine (Norvasc)  Gabapentin  Losartan  Menthol  Metoprolol  Polyethylene Glycol (MiraLAX)  Promethazine  Rosuvastatin  Sucralfate (Carafate) ==================================================================== For clinical consultation, please call [phone]. ====================================================================        ROS  Constitutional: Denies any fever or chills Gastrointestinal: No reported hemesis, hematochezia, vomiting, or acute GI distress Musculoskeletal:  +LBP, b/l leg pain Neurological: No reported episodes of acute onset apraxia, aphasia, dysarthria, agnosia, amnesia, paralysis, loss of coordination, or loss of consciousness  Medication Review  Menthol (Topical Analgesic), NONFORMULARY OR COMPOUNDED ITEM, acetaminophen, amLODipine, amitriptyline, gabapentin, losartan, metoprolol succinate, oxyCODONE-acetaminophen, pantoprazole, polyethylene glycol, promethazine, rosuvastatin, and sucralfate  History Review  Allergy: [name] has No Known Allergies. Drug: [name]  reports no history of drug use. Alcohol:  reports no history of alcohol use. Tobacco:  reports that she has been smoking cigarettes. She has a 14.00 pack-year smoking history. She has never used smokeless tobacco. Social: [name]  reports that she has been smoking cigarettes. She has a 14.00 pack-year smoking history. She has never used smokeless tobacco. She  reports that she does not drink alcohol and does not use drugs. Medical:  has a past medical history of AICD (automatic cardioverter/defibrillator) present ([place] cardiologist--- dr Caryl Comes ), Arthritis, Bladder cancer [facility]) (urologist-  dr Junious Silk), Chronic hyponatremia, Chronic low back pain with bilateral sciatica, Chronic systolic (congestive) heart failure ([place]), COPD with emphysema ([place]), Coronary artery disease (cardiologist-  dr Kathlyn [place]), DDD (degenerative disc disease), thoracolumbar, Frequent urination, Full dentures, Gait instability, GERD (gastroesophageal reflux disease), History of iron deficiency anemia (11/2013), History of stomach ulcers (11/2013), Hypertension, LBBB (left bundle branch block), NICM (nonischemic cardiomyopathy) ([place]) (last echo 12-31-2015 ef 55-60%), Nocturia more than twice per night, S/P insertion of spinal cord stimulator ([facility] by [facility]---- Dr Pecola Leisure (notes in care everywhere)), Scoliosis, Uses walker (02/18/2021), and Wears glasses. Surgical: [name]  has a past surgical history that includes Esophagogastroduodenoscopy (N/A, 11/28/2013); Transurethral resection of bladder tumor (N/A, 12/13/2013); Transurethral resection of bladder tumor (N/A, 01/17/2014); Cataract extraction w/ intraocular lens  implant, bilateral (Bilateral, right 12-2013 / left  02-2014); Cystoscopy with retrograde  pyelogram, ureteroscopy and stent placement (Bilateral, 04/18/2014); Transurethral resection of bladder tumor (N/A, 04/18/2014); Lumbar disc surgery (1980's); bi-ventricular implantable cardioverter defibrillator (N/A, 05/07/2014); Cystoscopy w/ retrogrades (Bilateral, 01/06/2015); Cystoscopy with biopsy (N/A, 10/06/2015); Cardiac catheterization (03-03-2014  dr Kathlyn [place]   [facility]); Transurethral resection of bladder (1995); Cardiovascular stress test (11/25/2013); transthoracic echocardiogram (12/31/2015   dr Caryl Comes); Cystoscopy with fulgeration (N/A, 06/30/2017); Spinal  cord stimulator implant (10-09-2018   @[facility] ); Cystoscopy with biopsy (N/A, 02/12/2019); Transurethral resection of bladder tumor (N/A, 12/13/2019); Transurethral resection of bladder tumor (N/A, 10/06/2020); colonscopy (11/2020); and Cystoscopy with biopsy (N/A, 03/19/2021). Family: family history includes Cancer in her brother and mother.  Laboratory Chemistry Profile   Renal Lab Results  Component Value Date   BUN 13 03/05/2021   CREATININE 0.53 03/05/2021   BCR 18 04/28/2014   GFR 89.19 08/27/2019   GFRAA >60 06/17/2019   GFRNONAA >60 03/05/2021     Hepatic Lab Results  Component Value Date   AST 33 12/09/2020   ALT 25 12/09/2020   ALBUMIN 4.6 12/09/2020   ALKPHOS 78 12/09/2020   LIPASE 30 11/23/2013     Electrolytes Lab Results  Component Value Date   NA 131 (L) 03/05/2021   K 4.9 03/05/2021   CL 98 03/05/2021   CALCIUM 9.7 03/05/2021   MG 2.3 06/03/2019     Bone Lab Results  Component Value Date   VD25OH 36.17 01/11/2019     Inflammation (CRP: Acute Phase) (ESR: Chronic Phase) No results found for: CRP, ESRSEDRATE, LATICACIDVEN     Note: Above Lab results reviewed.   Physical Exam  General appearance: Well nourished, well developed, and well hydrated. In no apparent acute distress Mental status: Alert, oriented x 3 (person, place, & time)       Respiratory: No evidence of acute respiratory distress Eyes: PERLA Vitals: BP (!) 149/86 (BP Location: Left Arm, Patient Position: Sitting, Cuff Size: Normal)    Pulse 70    Temp (!) 97 F (36.1 C) (Temporal)    Resp 16    Ht 5' 8"  (1.727 m)    Wt 120 lb (54.4 kg)    SpO2 100%    BMI 18.25 kg/m  BMI: Estimated body mass index is 18.25 kg/m as calculated from the following:   Height as of this encounter: 5' 8"  (1.727 m).   Weight as of this encounter: 120 lb (54.4 kg). Ideal: Ideal body weight: 63.9 kg (140 lb 14 oz)    Lumbar Exam  Skin & Axial Inspection: Well healed scar from previous spine surgery detected,  IPG for SCS in place Alignment: Symmetrical Functional ROM: Pain restricted ROM       Stability: No instability detected Muscle Tone/Strength: Functionally intact. No obvious neuro-muscular anomalies detected. Sensory (Neurological): Dermatomal pain pattern Palpation: No palpable anomalies           Gait & Posture Assessment  Ambulation: Patient ambulates using a walker Gait: Limited. Using assistive device to ambulate Posture: Difficulty standing up straight, due to pain    Lower Extremity Exam      Side: Right lower extremity   Side: Left lower extremity  Stability: No instability observed           Stability: No instability observed          Skin & Extremity Inspection: Skin color, temperature, and hair growth are WNL. No peripheral edema or cyanosis. No masses, redness, swelling, asymmetry, or associated skin lesions. No contractures.   Skin & Extremity Inspection: Skin  color, temperature, and hair growth are WNL. No peripheral edema or cyanosis. No masses, redness, swelling, asymmetry, or associated skin lesions. No contractures.  Functional ROM: Pain restricted ROM for all joints of the lower extremity           Functional ROM: Pain restricted ROM for all joints of the lower extremity          Muscle Tone/Strength: Functionally intact. No obvious neuro-muscular anomalies detected.   Muscle Tone/Strength: Functionally intact. No obvious neuro-muscular anomalies detected.  Sensory (Neurological): Dermatomal pain pattern         Sensory (Neurological): Dermatomal pain pattern        DTR: Patellar: deferred today Achilles: deferred today Plantar: deferred today   DTR: Patellar: deferred today Achilles: deferred today Plantar: deferred today  Palpation: No palpable anomalies   Palpation: No palpable anomalies       Assessment   Status Diagnosis  Persistent Persistent Persistent 1. Pain, rectal   2. Lumbar radiculopathy   3. Lumbar degenerative disc disease   4. Spinal  stenosis, lumbar region, with neurogenic claudication   5. Lumbar facet arthropathy   6. Spinal cord stimulator status   7. SI joint arthritis   8. Lumbar spondylosis   9. Chronic pain syndrome        Plan of Care   [name] has a current medication list which includes the following long-term medication(s): amlodipine, gabapentin, losartan, metoprolol succinate, promethazine, rosuvastatin, sucralfate, and amitriptyline.  Pharmacotherapy (Medications Ordered): Meds ordered this encounter  Medications   oxyCODONE-acetaminophen (PERCOCET) 10-325 MG tablet    Sig: Take 1 tablet by mouth every 4 (four) hours as needed for pain.    Dispense:  150 tablet    Refill:  0   oxyCODONE-acetaminophen (PERCOCET) 10-325 MG tablet    Sig: Take 1 tablet by mouth every 4 (four) hours as needed for pain.    Dispense:  150 tablet    Refill:  0   oxyCODONE-acetaminophen (PERCOCET) 10-325 MG tablet    Sig: Take 1 tablet by mouth every 4 (four) hours as needed for pain.    Dispense:  150 tablet    Refill:  0   NONFORMULARY OR COMPOUNDED ITEM    Sig: Sig: Apply 1-2 gm(s) (2-4 pumps) to affected area, 3-4 times/day. (1 pump = 0.5 gm)    Dispense:  1 each    Refill:  2    Compounded cream: 6% Gabapentin, 2.5% Lidocaine, 0.5% Meloxicam, 5% Methocarbamol, 2.5% Prilocaine. Dispense: 120 gm Pump Bottle. (Dispenser: 1 pump = 0.5 gm.)  Continue amitriptyline 20 mg nightly, gabapentin 300-600 mg 3 times daily. Continue with spinal cord stimulation.  Follow-up plan:   Return in about 3 months (around 08/03/2021) for Medication Management, in person.   Recent Visits No visits were found meeting these conditions. Showing recent visits within past 90 days and meeting all other requirements Today's Visits Date Type Provider Dept  05/06/21 Office Visit [name], MD [facility]  Showing today's visits and meeting all other requirements Future Appointments Date Type Provider Dept   07/29/21 Appointment [name], MD [facility]  Showing future appointments within next 90 days and meeting all other requirements  I discussed the assessment and treatment plan with the patient. The patient was provided an opportunity to ask questions and all were answered. The patient agreed with the plan and demonstrated an understanding of the instructions.  Patient advised to call back or seek an in-person evaluation if  the symptoms or condition worsens.  Duration of encounter: 30 minutes.  Note by: [name], MD Date: 05/06/2021; Time: 2:53 PM

## 2021-05-17 ENCOUNTER — Telehealth: Payer: Self-pay

## 2021-05-17 NOTE — Telephone Encounter (Signed)
The compound medication he gave her for rectal pain, she needs to talk to the nurse regarding how to take it.. She states she was talking to a tall nurse when she was here before. She cant remember her name.

## 2021-05-18 NOTE — Telephone Encounter (Signed)
Returned patient phone call.  Discussed application of compounding cream.  There is no special applicator for this.  She should just try and get it into the rectum with her finger or a gloved finger.  Patient verbalizes u/o information given.

## 2021-05-19 ENCOUNTER — Other Ambulatory Visit: Payer: Self-pay | Admitting: Internal Medicine

## 2021-05-19 ENCOUNTER — Telehealth: Payer: Self-pay | Admitting: *Deleted

## 2021-05-19 NOTE — Telephone Encounter (Signed)
Please schedule, she is overdue for follow up appointment to continue getting medications.  ? ?Patient need to be seen for appointment for refills. Requesting refill on Amlodipine  ? ?Last seen by Dr. Caryl Comes 06/18/2019, a televisit ? ?Last seen by Dr. Fletcher Anon 11/26/2019 ?

## 2021-05-20 NOTE — Telephone Encounter (Signed)
Left message to return call to office. Patient needs appointment schedule prior to refilling medication. ?

## 2021-05-20 NOTE — Telephone Encounter (Signed)
Scheduled with klein ?

## 2021-05-20 NOTE — Telephone Encounter (Signed)
Please see below and advise if OK to refill. Patient scheduled to be seen by Dr. Caryl Comes in May. Thank you! ?

## 2021-05-20 NOTE — Telephone Encounter (Signed)
Pt would like to be called regarding medication 

## 2021-05-21 MED ORDER — AMLODIPINE BESYLATE 2.5 MG PO TABS: 2.5000 mg | ORAL_TABLET | Freq: Every day | ORAL | 4 refills | Status: DC

## 2021-05-21 NOTE — Telephone Encounter (Signed)
Pt called about previous message 

## 2021-05-21 NOTE — Telephone Encounter (Signed)
Reviewed the patient's chart. ?Seen by Oda Kilts, EP PA in 02/2021. ?Not due to see Dr. Caryl Comes for 6 months. ? ?Will refill amlodipine for now. ? ?However, the patient is a year overdue to see her primary cardiologist- Dr. Fletcher Anon. ?She should schedule to see him as well.  ? ? ?

## 2021-05-26 DIAGNOSIS — C674 Malignant neoplasm of posterior wall of bladder: Secondary | ICD-10-CM | POA: Diagnosis not present

## 2021-05-28 ENCOUNTER — Ambulatory Visit (INDEPENDENT_AMBULATORY_CARE_PROVIDER_SITE_OTHER): Payer: Medicare Other

## 2021-05-28 DIAGNOSIS — I5022 Chronic systolic (congestive) heart failure: Secondary | ICD-10-CM

## 2021-05-28 DIAGNOSIS — I428 Other cardiomyopathies: Secondary | ICD-10-CM

## 2021-05-28 LAB — CUP PACEART REMOTE DEVICE CHECK
Battery Remaining Longevity: 3 mo
Battery Voltage: 2.72 V
Brady Statistic AP VP Percent: 44.4 %
Brady Statistic AP VS Percent: 0.76 %
Brady Statistic AS VP Percent: 53.26 %
Brady Statistic AS VS Percent: 1.58 %
Brady Statistic RA Percent Paced: 44.27 %
Brady Statistic RV Percent Paced: 95.43 %
Date Time Interrogation Session: 20230310043822
HighPow Impedance: 68 Ohm
Implantable Lead Implant Date: 20160217
Implantable Lead Implant Date: 20160217
Implantable Lead Implant Date: 20160217
Implantable Lead Location: 753858
Implantable Lead Location: 753859
Implantable Lead Location: 753860
Implantable Lead Model: 4298
Implantable Lead Model: 5076
Implantable Pulse Generator Implant Date: 20160217
Lead Channel Impedance Value: 361 Ohm
Lead Channel Impedance Value: 361 Ohm
Lead Channel Impedance Value: 361 Ohm
Lead Channel Impedance Value: 418 Ohm
Lead Channel Impedance Value: 418 Ohm
Lead Channel Impedance Value: 456 Ohm
Lead Channel Impedance Value: 532 Ohm
Lead Channel Impedance Value: 551 Ohm
Lead Channel Impedance Value: 703 Ohm
Lead Channel Impedance Value: 703 Ohm
Lead Channel Impedance Value: 836 Ohm
Lead Channel Impedance Value: 874 Ohm
Lead Channel Impedance Value: 893 Ohm
Lead Channel Pacing Threshold Amplitude: 0.5 V
Lead Channel Pacing Threshold Amplitude: 0.75 V
Lead Channel Pacing Threshold Amplitude: 1.25 V
Lead Channel Pacing Threshold Pulse Width: 0.4 ms
Lead Channel Pacing Threshold Pulse Width: 0.4 ms
Lead Channel Pacing Threshold Pulse Width: 0.4 ms
Lead Channel Sensing Intrinsic Amplitude: 2 mV
Lead Channel Sensing Intrinsic Amplitude: 2 mV
Lead Channel Sensing Intrinsic Amplitude: 21.125 mV
Lead Channel Sensing Intrinsic Amplitude: 21.125 mV
Lead Channel Setting Pacing Amplitude: 2 V
Lead Channel Setting Pacing Amplitude: 2.25 V
Lead Channel Setting Pacing Amplitude: 2.5 V
Lead Channel Setting Pacing Pulse Width: 0.4 ms
Lead Channel Setting Pacing Pulse Width: 0.4 ms
Lead Channel Setting Sensing Sensitivity: 0.45 mV

## 2021-06-02 NOTE — Progress Notes (Signed)
Remote ICD transmission.   

## 2021-06-03 ENCOUNTER — Telehealth: Payer: Self-pay

## 2021-06-03 NOTE — Telephone Encounter (Signed)
LMTCB. Pt has not been seen since 2021 and will need to schedule an appt beofre we can refill her medication. Please schedule pt an appt when she returns call.  ?

## 2021-06-22 ENCOUNTER — Encounter: Payer: Self-pay | Admitting: Oncology

## 2021-06-28 ENCOUNTER — Ambulatory Visit (INDEPENDENT_AMBULATORY_CARE_PROVIDER_SITE_OTHER): Payer: Medicare Other

## 2021-06-28 DIAGNOSIS — I428 Other cardiomyopathies: Secondary | ICD-10-CM

## 2021-06-29 LAB — CUP PACEART REMOTE DEVICE CHECK
Battery Remaining Longevity: 1 mo
Battery Voltage: 2.74 V
Brady Statistic AP VP Percent: 47.53 %
Brady Statistic AP VS Percent: 0.85 %
Brady Statistic AS VP Percent: 50.49 %
Brady Statistic AS VS Percent: 1.14 %
Brady Statistic RA Percent Paced: 47.38 %
Brady Statistic RV Percent Paced: 94.41 %
Date Time Interrogation Session: 20230410063423
HighPow Impedance: 70 Ohm
Implantable Lead Implant Date: 20160217
Implantable Lead Implant Date: 20160217
Implantable Lead Implant Date: 20160217
Implantable Lead Location: 753858
Implantable Lead Location: 753859
Implantable Lead Location: 753860
Implantable Lead Model: 4298
Implantable Lead Model: 5076
Implantable Pulse Generator Implant Date: 20160217
Lead Channel Impedance Value: 399 Ohm
Lead Channel Impedance Value: 418 Ohm
Lead Channel Impedance Value: 418 Ohm
Lead Channel Impedance Value: 418 Ohm
Lead Channel Impedance Value: 456 Ohm
Lead Channel Impedance Value: 532 Ohm
Lead Channel Impedance Value: 532 Ohm
Lead Channel Impedance Value: 532 Ohm
Lead Channel Impedance Value: 722 Ohm
Lead Channel Impedance Value: 760 Ohm
Lead Channel Impedance Value: 817 Ohm
Lead Channel Impedance Value: 874 Ohm
Lead Channel Impedance Value: 874 Ohm
Lead Channel Pacing Threshold Amplitude: 0.5 V
Lead Channel Pacing Threshold Amplitude: 0.75 V
Lead Channel Pacing Threshold Amplitude: 1.375 V
Lead Channel Pacing Threshold Pulse Width: 0.4 ms
Lead Channel Pacing Threshold Pulse Width: 0.4 ms
Lead Channel Pacing Threshold Pulse Width: 0.4 ms
Lead Channel Sensing Intrinsic Amplitude: 22.75 mV
Lead Channel Sensing Intrinsic Amplitude: 22.75 mV
Lead Channel Sensing Intrinsic Amplitude: 3.625 mV
Lead Channel Sensing Intrinsic Amplitude: 3.625 mV
Lead Channel Setting Pacing Amplitude: 2 V
Lead Channel Setting Pacing Amplitude: 2.5 V
Lead Channel Setting Pacing Amplitude: 2.5 V
Lead Channel Setting Pacing Pulse Width: 0.4 ms
Lead Channel Setting Pacing Pulse Width: 0.4 ms
Lead Channel Setting Sensing Sensitivity: 0.45 mV

## 2021-06-30 ENCOUNTER — Encounter: Payer: Self-pay | Admitting: Student in an Organized Health Care Education/Training Program

## 2021-06-30 ENCOUNTER — Ambulatory Visit
Payer: Medicare Other | Attending: Student in an Organized Health Care Education/Training Program | Admitting: Student in an Organized Health Care Education/Training Program

## 2021-06-30 VITALS — BP 146/86 | HR 70 | Temp 96.8°F | Resp 16 | Ht 68.0 in | Wt 120.0 lb

## 2021-06-30 DIAGNOSIS — M48062 Spinal stenosis, lumbar region with neurogenic claudication: Secondary | ICD-10-CM | POA: Diagnosis not present

## 2021-06-30 DIAGNOSIS — K6289 Other specified diseases of anus and rectum: Secondary | ICD-10-CM | POA: Diagnosis not present

## 2021-06-30 DIAGNOSIS — M5416 Radiculopathy, lumbar region: Secondary | ICD-10-CM

## 2021-06-30 DIAGNOSIS — M47816 Spondylosis without myelopathy or radiculopathy, lumbar region: Secondary | ICD-10-CM

## 2021-06-30 DIAGNOSIS — Z9689 Presence of other specified functional implants: Secondary | ICD-10-CM

## 2021-06-30 DIAGNOSIS — M51369 Other intervertebral disc degeneration, lumbar region without mention of lumbar back pain or lower extremity pain: Secondary | ICD-10-CM

## 2021-06-30 DIAGNOSIS — M5136 Other intervertebral disc degeneration, lumbar region: Secondary | ICD-10-CM | POA: Insufficient documentation

## 2021-06-30 MED ORDER — KETOROLAC TROMETHAMINE 30 MG/ML IJ SOLN
30.0000 mg | Freq: Once | INTRAMUSCULAR | Status: AC
  Administered 2021-06-30: 30 mg via INTRAMUSCULAR
  Filled 2021-06-30: qty 1

## 2021-06-30 MED ORDER — ORPHENADRINE CITRATE 30 MG/ML IJ SOLN
30.0000 mg | Freq: Once | INTRAMUSCULAR | Status: AC
  Administered 2021-06-30: 30 mg via INTRAMUSCULAR
  Filled 2021-06-30: qty 2

## 2021-06-30 NOTE — Progress Notes (Signed)
PROVIDER NOTE: Information contained herein reflects review and annotations entered in association with encounter. Interpretation of such information and data should be left to medically-trained personnel. Information provided to patient can be located elsewhere in the medical record under "Patient Instructions". Document created using STT-dictation technology, any transcriptional errors that may result from process are unintentional.  ?  ?Patient: [name]  Service Category: E/M  Provider: [name], MD  ?DOB: Oct 17, 1940  DOS: 06/30/2021  Specialty: Interventional Pain Management  ?MRN: [id number]  Setting: Ambulatory outpatient  PCP: [name], MD  ?Type: Established Patient    Referring Provider: [name], MD  ?Location: Office  Delivery: Face-to-face    ? ?HPI  ?[name], a 81 y.o. year old female, is here today because of her Pain, rectal [K62.89]. [name] primary complain today is Back Pain ?Last encounter: My last encounter with her was on 05/06/2021. ?Pertinent problems: [name] has Spinal stenosis of thoracolumbar region; Spinal stenosis in cervical region; Spinal stenosis, lumbar region, with neurogenic claudication; Lumbar spondylosis; SI joint arthritis; Lumbar degenerative disc disease; Chronic pain syndrome; Lumbar radiculopathy; Lumbar facet arthropathy; and Back pain on their pertinent problem list. ?Pain Assessment: Severity of Chronic pain is reported as a 10-Worst pain ever/10. Location: Back Right, Left, Lower/into hips and buttocks and upper thighs bilat. Onset: More than a month ago. Quality: Constant, Dull. Timing: Constant. Modifying factor(s): medications, laying down. ?Vitals:  height is '5\' 8"'$  (1.727 m) and weight is 120 lb (54.4 kg). Her temporal temperature is 96.8 ?F (36 ?C) (abnormal). Her blood pressure is 146/86 (abnormal) and her pulse is 70. Her respiration is 16 and oxygen saturation is 100%.  ? ?Reason for encounter: follow-up evaluation.  ? ?Patient  presents today for follow-up for low back and rectal pain.  At her last clinic visit, she was prescribed a compounded cream containing 6% Gabapentin, 2.5% Lidocaine, 0.5% Meloxicam, 5% Methocarbamol, 2.5% Prilocaine.  ?She states that she used the cream on a couple of occasions and it provided some pain relief but not to the level that she was hoping.  She states that the cream was expensive. ?Given her acute on chronic pain, recommend intramuscular Norflex /Toradol injection today.  Patient advised to refrain from NSAIDs for the next 3 days. ? ?Pharmacotherapy Assessment  ?Analgesic: Percocet 10 mg up to 5 times a day as needed for severe breakthrough pain, quantity 150/month; MME equals 75  ? ?Monitoring: ?[place] PMP: PDMP reviewed during this encounter.       ?Pharmacotherapy: No side-effects or adverse reactions reported. ?Compliance: No problems identified. ?Effectiveness: Clinically acceptable. ? ?[name], RN  06/30/2021  1:02 PM  Sign when Signing Visit ?Nursing Pain Medication Assessment:  ?Safety precautions to be maintained throughout the outpatient stay will include: orient to surroundings, keep bed in low position, maintain call bell within reach at all times, provide assistance with transfer out of bed and ambulation.  ?Medication Inspection Compliance: Pill count conducted under aseptic conditions, in front of the patient. Neither the pills nor the bottle was removed from the patient's sight at any time. Once count was completed pills were immediately returned to the patient in their original bottle. ? ?Medication: Oxycodone/APAP ?Pill/Patch Count:  39 of 150 pills remain ?Pill/Patch Appearance: Markings consistent with prescribed medication ?Bottle Appearance: Standard pharmacy container. Clearly labeled. ?Filled Date: 03 / 20 / 2023 ?Last Medication intake:  Today ? ?Pt states she has 3 more pills in pill counter at home. ?  UDS:  ?Summary  ?Date Value Ref Range Status  ?08/11/2020 Note  Final  ?   Comment:  ?  ==================================================================== ?[employer] Select 13 (MW) ?==================================================================== ?Test                             Result       Flag       Units ? ?Drug Present and Declared for Prescription Verification ?  Oxycodone                      4370         EXPECTED   ng/mg creat ?  Oxymorphone                    8753         EXPECTED   ng/mg creat ?  Noroxycodone                   12500        EXPECTED   ng/mg creat ?  Noroxymorphone                 2957         EXPECTED   ng/mg creat ?   Sources of oxycodone are scheduled prescription medications. ?   Oxymorphone, noroxycodone, and noroxymorphone are expected ?   metabolites of oxycodone. Oxymorphone is also available as a ?   scheduled prescription medication. ? ?==================================================================== ?Test                      Result    Flag   Units      Ref Range ?  Creatinine              30               mg/dL      >=20 ?==================================================================== ?Declared Medications: ? The flagging and interpretation on this report are based on the ? following declared medications.  Unexpected results may arise from ? inaccuracies in the declared medications. ? ? **Note: The testing scope of this panel includes these medications: ? ? Oxycodone (Percocet) ? ? **Note: The testing scope of this panel does not include the ? following reported medications: ? ? Acetaminophen ? Acetaminophen (Percocet) ? Amitriptyline ? Amlodipine (Norvasc) ? Gabapentin ? Losartan ? Menthol ? Metoprolol ? Polyethylene Glycol (MiraLAX) ? Promethazine ? Rosuvastatin ? Sucralfate (Carafate) ?==================================================================== ?For clinical consultation, please call [phone]. ?==================================================================== ?  ?  ? ?ROS  ?Constitutional: Denies any fever or  chills ?Gastrointestinal: No reported hemesis, hematochezia, vomiting, or acute GI distress ?Musculoskeletal:  Low back, rectal pain ?Neurological: No reported episodes of acute onset apraxia, aphasia, dysarthria, agnosia, amnesia, paralysis, loss of coordination, or loss of consciousness ? ?Medication Review  ?Menthol (Topical Analgesic), NONFORMULARY OR COMPOUNDED ITEM, acetaminophen, amLODipine, amitriptyline, gabapentin, losartan, metoprolol succinate, oxyCODONE-acetaminophen, pantoprazole, polyethylene glycol, promethazine, and sucralfate ? ?History Review  ?Allergy: [name] has No Known Allergies. ?Drug: [name]  reports no history of drug use. ?Alcohol:  reports no history of alcohol use. ?Tobacco:  reports that she has been smoking cigarettes. She has a 14.00 pack-year smoking history. She has never used smokeless tobacco. ?Social: [name]  reports that she has been smoking cigarettes. She has a 14.00 pack-year smoking history. She has never used smokeless tobacco. She reports that she does not drink alcohol and does not use  drugs. ?Medical:  has a past medical history of AICD (automatic cardioverter/defibrillator) present (EP cardiologist--- dr Caryl Comes ), Arthritis, Bladder cancer [facility]) (urologist-  dr Junious Silk), Chronic hyponatremia, Chronic low back pain with bilateral sciatica, Chronic systolic (congestive) heart failure ([place]), COPD with emphysema ([place]), Coronary artery disease (cardiologist-  dr Kathlyn [place]), DDD (degenerative disc disease), thoracolumbar, Frequent urination, Full dentures, Gait instability, GERD (gastroesophageal reflux disease), History of iron deficiency anemia (11/2013), History of stomach ulcers (11/2013), Hypertension, LBBB (left bundle branch block), NICM (nonischemic cardiomyopathy) ([place]) (last echo 12-31-2015 ef 55-60%), Nocturia more than twice per night, S/P insertion of spinal cord stimulator (followed by [facility]---- Dr Pecola Leisure (notes in care everywhere)),  Scoliosis, Uses walker (02/18/2021), and Wears glasses. ?Surgical: [name]  has a past surgical history that includes Esophagogastroduodenoscopy (N/A, 11/28/2013); Transurethral resection of bladder tumor (N/A, 0

## 2021-06-30 NOTE — Progress Notes (Signed)
Nursing Pain Medication Assessment:  ?Safety precautions to be maintained throughout the outpatient stay will include: orient to surroundings, keep bed in low position, maintain call bell within reach at all times, provide assistance with transfer out of bed and ambulation.  ?Medication Inspection Compliance: Pill count conducted under aseptic conditions, in front of the patient. Neither the pills nor the bottle was removed from the patient's sight at any time. Once count was completed pills were immediately returned to the patient in their original bottle. ? ?Medication: Oxycodone/APAP ?Pill/Patch Count:  39 of 150 pills remain ?Pill/Patch Appearance: Markings consistent with prescribed medication ?Bottle Appearance: Standard pharmacy container. Clearly labeled. ?Filled Date: 03 / 20 / 2023 ?Last Medication intake:  Today ? ?Pt states she has 3 more pills in pill counter at home. ?

## 2021-07-01 ENCOUNTER — Telehealth: Payer: Self-pay

## 2021-07-01 ENCOUNTER — Ambulatory Visit: Payer: 59 | Admitting: Student in an Organized Health Care Education/Training Program

## 2021-07-01 NOTE — Telephone Encounter (Signed)
Patient called again, says in March 2021 she went to [facility] for the same kind of rectal pain she is having now. They gave her a suppository. She has the name written down, wants to know if dr Holley Raring will write for one of those. Please call patient ?

## 2021-07-01 NOTE — Telephone Encounter (Signed)
Spoke with Dr Holley Raring and he is unable to speak with her today.  Informed patient  that  he has nothing else to offer her but suggests she get a 2nd opinion.  Patient notified.  ?

## 2021-07-01 NOTE — Telephone Encounter (Signed)
The patient called to say the shots he gave her yesterday did not work and she wants Dr. Holley Raring to call her today.... ?

## 2021-07-02 ENCOUNTER — Ambulatory Visit (INDEPENDENT_AMBULATORY_CARE_PROVIDER_SITE_OTHER): Payer: Medicare Other | Admitting: Internal Medicine

## 2021-07-02 ENCOUNTER — Encounter: Payer: Self-pay | Admitting: Internal Medicine

## 2021-07-02 ENCOUNTER — Encounter: Payer: Self-pay | Admitting: Oncology

## 2021-07-02 ENCOUNTER — Ambulatory Visit: Payer: 59 | Admitting: Internal Medicine

## 2021-07-02 VITALS — BP 130/86 | HR 61 | Temp 97.8°F | Resp 14 | Ht 68.0 in | Wt 118.6 lb

## 2021-07-02 DIAGNOSIS — K6289 Other specified diseases of anus and rectum: Secondary | ICD-10-CM

## 2021-07-02 DIAGNOSIS — M5416 Radiculopathy, lumbar region: Secondary | ICD-10-CM

## 2021-07-02 DIAGNOSIS — M7918 Myalgia, other site: Secondary | ICD-10-CM

## 2021-07-02 MED ORDER — ACETAMINOPHEN 650 MG RE SUPP: 650.0000 mg | Freq: Two times a day (BID) | RECTAL | 2 refills | Status: DC | PRN

## 2021-07-02 MED ORDER — DIBUCAINE (PERIANAL) 1 % EX OINT: 1.0000 "application " | TOPICAL_OINTMENT | Freq: Two times a day (BID) | CUTANEOUS | 2 refills | Status: DC | PRN

## 2021-07-02 MED ORDER — LIDOCAINE-HYDROCORTISONE ACE 2.8-0.55 % RE GEL: 1.0000 "application " | Freq: Two times a day (BID) | RECTAL | 0 refills | Status: DC | PRN

## 2021-07-02 NOTE — Patient Instructions (Addendum)
Lidocaine pain patch 4% over the counter  ? ?Or you could try aspercream with lidocaine or voltaren gel over the counter  ? ? ? ?Donut pillow on amazon  ? ? ?

## 2021-07-02 NOTE — Progress Notes (Signed)
Chief Complaint  ?Patient presents with  ? Acute Visit  ?  Pt arrives for rectal pain, ongoing issue for awhile has been getting worse. Has hx of rectal pain has been treating with oxycodone,biofreeze and compounded solution. Denies any rectal bleeding or discharge.   ? ?Acute visit  ?1. Rectal pain chronic but progressively worse on max dose of pain medications per Dr. Holley Raring pain clinic and has tried injections with him weds prior to visit had toradol and norflex w/o relief over the last 6 months worse 10/10 tried otc topicals as well biofreeze no blood in stool pain 10/10 ?Tried lidocaine topically per the pain clinic and this did not help in the past pt requesting tylenol supoository ?Recently had colonoscopy Dr. Stacie Glaze GI in [place] sent today  ?Prior imaging low back ?She has gait instability and balance issues and baseline uses a rollator  ?MRI lumbar ?IMPRESSION:  ?Thoracolumbar scoliosis with advanced multilevel lumbar disc and  ?facet degeneration resulting in severe multifactorial spinal  ?stenosis at L2-3 and L3-4.  ?  ?  ?Electronically Signed  ?  By: Logan Bores  ?  On: 02/17/2014 14:29  ?CT lumbar ?IMPRESSION: ?1. Diffuse lumbar spine spondylosis as described above. ?2. No acute osseous injury of the lumbar spine. ?  ?  ?Electronically Signed ?  By: Kathreen Devoid ?  On: 05/27/2016 17:22 ?  ?  ? ?Review of Systems  ?Musculoskeletal:  Positive for back pain.  ?     +buttock pain near rectum  ?Past Medical History:  ?Diagnosis Date  ? AICD (automatic cardioverter/defibrillator) present EP cardiologist--- dr Caryl Comes   ? placement 05-07-2014 , ef 25%,  NICM/  (02-11-2019 last echo 08-31-2018 ef 60-65%)  ? Arthritis   ? "in about all my joints; for sure in my back"  ? Bladder cancer [facility]) urologist-  dr eskridge  ? dx 1995--  recurrent bladder cancer 2015 , s/p TURBT's and chemo instillation's ;   04/ 2019  s/p TURBT  ? Chronic hyponatremia   ? Chronic low back pain with bilateral sciatica   ? s/p   spinal cord stimulator @ [facility]  10-09-2018  ? Chronic systolic (congestive) heart failure ([facility])   ? cardiologist-  dr Rogue Jury  ? 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)   ? (02-11-2019  per pt has never been on oxygen)  ? Coronary artery disease cardiologist-  dr Kathlyn [place]  ? 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% per cardiac cath 03-03-2014  ? DDD (degenerative disc disease), thoracolumbar   ? Frequent urination   ? Full dentures   ? Gait instability   ? due to chronic low back pain, uses roller walker  ? GERD (gastroesophageal reflux disease)   ? History of iron deficiency anemia 11/2013  ? resolved w/ IV Iron  (02-11-2019  per pt has not had any issues since 2015)  ? History of stomach ulcers 11/2013  ? Hypertension   ? LBBB (left bundle branch block)   ? NICM (nonischemic cardiomyopathy) ([place]) last echo 12-31-2015 ef 55-60%  ? dx 09/ 2015 per echo 20%;  myoview 09/ 2015 ef 28%;  per cardiac cath 12/ 2015 ef 30%;    ? Nocturia more than twice per night   ? S/P insertion of spinal cord stimulator followed by [facility]---- Dr Pecola Leisure (notes in care everywhere)  ? 10-09-2018  '@[facility]'$ --- thoracic spinal cord stimular/ generator  (device from [employer])---- per pt has a control  ? Scoliosis   ?  Uses walker 02/18/2021  ? all the time  ? Wears glasses   ? for reading  ? ?Past Surgical History:  ?Procedure Laterality Date  ? BI-VENTRICULAR IMPLANTABLE CARDIOVERTER DEFIBRILLATOR N/A 05/07/2014  ? Procedure: BI-VENTRICULAR IMPLANTABLE CARDIOVERTER DEFIBRILLATOR  (CRT-D);  Surgeon: Deboraha Sprang, MD;  Location: [facility] C[facility] LAB;  Service: Cardiovascular;  Laterality: N/A;  ? CARDIAC CATHETERIZATION  03-03-2014  dr Kathlyn [place]   [facility]  ? pLAD 20%, pRCA 20%, dRCA 50%, RPLS 50%;  ef 30%, mild elevated LVEDP, mild gradient across aortic valve LVOT  ? CARDIOVASCULAR STRESS TEST  11/25/2013  ? High risk nuclear study w/ large high severity inferior wall perfusion defect on stress and rest images, large mild severity anteroseptal wall perfusion  defect on stress and rest images, No inducible ischemia/ global moderate hypokinesis, ef 28%  ? CATARACT EXTRACTION W/ INTRAOCULAR LENS  IMPLANT, BILATERAL Bilateral right 12-2013 / left  02-2014  ? colonscopy  11/2020  ? benign polyps removed  ? CYSTOSCOPY W/ RETROGRADES Bilateral 01/06/2015  ? Procedure: CYSTOSCOPY WITH  BLADDER BIOPSY BILATERAL RETROGRADE PYELOGRAM,INSTILLATION OF MITOMYCIN C;  Surgeon: [name], MD;  Location: [facility];  Service: Urology;  Laterality: Bilateral;  ? CYSTOSCOPY WITH BIOPSY N/A 10/06/2015  ? Procedure: CYSTO WITH BLADDER BIOPSY, FULGERATION, CHEMO IRRIGATION EPIRUBICIN IN PACU;  Surgeon: [name], MD;  Location: [facility];  Service: Urology;  Laterality: N/A;  ? CYSTOSCOPY WITH BIOPSY N/A 02/12/2019  ? Procedure: CYSTOSCOPY WITH BIOPSY/ FULGURATION/ INSTILLATION OF GEMCITABINE, bilateral retrograde turbt greater 5cm;  Surgeon: [name], MD;  [facility];  Service: Urology;  Laterality: N/A;  ? CYSTOSCOPY WITH BIOPSY N/A 03/19/2021  ? Procedure: CYSTOSCOPY WITH TRANSURETHRAL RESECTION OF BLADDER TUMOR  GREATER THAN 5CM WITH POST-OPERATIVE INSTILLATION OF EPIRUBICIN;  Surgeon: [name], MD;  Location: [facility];  Service: Urology;  Laterality: N/A;  ? CYSTOSCOPY WITH FULGERATION N/A 06/30/2017  ? Procedure: [place] CYSTOSCOPY WITH [place] FULGERATION/ BLADDER BIOPSY/ INSTILLATION OF EPIRUBICIN;  Surgeon: [name], MD;  Location: [facility];  Service: Urology;  Laterality: N/A;  ? CYSTOSCOPY WITH RETROGRADE PYELOGRAM, URETEROSCOPY AND STENT PLACEMENT Bilateral 04/18/2014  ? Procedure: CYSTOSCOPY WITH RETROGRADE PYELOGRAM;  Surgeon: [name], MD;  Location: [facility];  Service: Urology;  Laterality: Bilateral;  ? ESOPHAGOGASTRODUODENOSCOPY N/A 11/28/2013  ? Procedure: ESOPHAGOGASTRODUODENOSCOPY (EGD);  Surgeon: Arta Silence, MD;  Location: [facility] ENDOSCOPY;  Service: Endoscopy;  Laterality: N/A;  ? LUMBAR  DISC SURGERY  1980's  ? "ruptured disc"  ? SPINAL CORD STIMULATOR IMPLANT  10-09-2018   '@[facility]'$   ? 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 TRANSTHORACIC ECHOCARDIOGRAM  12/31/2015   dr Caryl Comes  ? ef 48-250, grade 1 diastolic dysfunction/ mild MR/ septal motion showed abnormal function and dyssynergy  ? TRANSURETHRAL RESECTION OF BLADDER  1995  ? TRANSURETHRAL RESECTION OF BLADDER TUMOR N/A 12/13/2013  ? Procedure: TRANSURETHRAL RESECTION OF BLADDER TUMOR (TURBT);  Surgeon: [name], MD;  Location: [facility];  Service: Urology;  Laterality: N/A;  ? TRANSURETHRAL RESECTION OF BLADDER TUMOR N/A 01/17/2014  ? Procedure: TRANSURETHRAL RESECTION OF BLADDER TUMOR (TURBT);  Surgeon: [name], MD;  Location: [facility];  Service: Urology;  Laterality: N/A;  ? TRANSURETHRAL RESECTION OF BLADDER TUMOR N/A 04/18/2014  ? Procedure: TRANSURETHRAL RESECTION OF BLADDER TUMOR (TURBT), CYSTOGRAM;  Surgeon: [name], MD;  Location: [facility];  Service: Urology;  Laterality: N/A;  ? TRANSURETHRAL RESECTION OF BLADDER TUMOR N/A 12/13/2019  ? Procedure:  TRANSURETHRAL RESECTION OF BLADDER TUMOR (TURBT) GREATER THAN 5CM WITH CYSTOSCOPY/ POST OPERATIVE INSTILLATION OF GEMCITABINE;  Surgeon: [name], MD;  Location: [facility];  Service: Urology;  Laterality: N/A;  ? TRANSURETHRAL RESECTION OF BLADDER TUMOR N/A 10/06/2020  ? Procedure: TRANSURETHRAL RESECTION OF BLADDER TUMOR (TURBT)/ CYSTOSCOPY/  BLADDER BIOPSY;  Surgeon: [name], MD;  Location: [facility];  Service: Urology;  Laterality: N/A;  ? ?Family History  ?Problem Relation Age of Onset  ? Cancer Mother   ? Cancer Brother   ? ?Social History  ? ?Socioeconomic History  ? Marital status: Widowed  ?  Spouse name: Not on file  ? Number of children: Not on file  ? Years of education: Not on file  ? Highest education level: Not on file  ?Occupational History  ? Not on file  ?Tobacco Use  ? Smoking  status: Every Day  ?  Packs/day: 0.25  ?  Years: 56.00  ?  Pack years: 14.00  ?  Types: Cigarettes  ? Smokeless tobacco: Never  ? Tobacco comments:  ?  02-11-2019  per pt currently smokes 10 cig's per day , sta

## 2021-07-05 ENCOUNTER — Telehealth: Payer: Self-pay | Admitting: Internal Medicine

## 2021-07-05 NOTE — Telephone Encounter (Signed)
Pt saw Dr. Linus Orn on Friday and the medication was not called in. Pt states she is in a lot pain. ?

## 2021-07-05 NOTE — Telephone Encounter (Signed)
The medication was sent tylenol suppositories and lidocaine-hydrocortisone sent 07/02/21  ?What is going on call pharmacy please and call pt  ? ?Also pain clinic advised no long acting pain medication is an option  ?Does she want to consult to palliative care which comes to her house for pain control?  ? ?

## 2021-07-06 ENCOUNTER — Telehealth: Payer: Self-pay | Admitting: Internal Medicine

## 2021-07-06 NOTE — Telephone Encounter (Signed)
Rectal pain needing an appointment ASAP. Saw Dr. Aundra Dubin on 07/02/21. Would like a call back from [place]. ?

## 2021-07-06 NOTE — Telephone Encounter (Signed)
Pt is scheduled for 4/19/203 at 1:00 pm.  ?

## 2021-07-07 ENCOUNTER — Encounter: Payer: Self-pay | Admitting: Internal Medicine

## 2021-07-07 ENCOUNTER — Ambulatory Visit (INDEPENDENT_AMBULATORY_CARE_PROVIDER_SITE_OTHER): Payer: Medicare Other | Admitting: Internal Medicine

## 2021-07-07 ENCOUNTER — Other Ambulatory Visit: Payer: Self-pay | Admitting: Internal Medicine

## 2021-07-07 VITALS — BP 122/78 | HR 83 | Temp 97.9°F | Ht 68.0 in | Wt 116.2 lb

## 2021-07-07 DIAGNOSIS — J439 Emphysema, unspecified: Secondary | ICD-10-CM | POA: Diagnosis not present

## 2021-07-07 DIAGNOSIS — I482 Chronic atrial fibrillation, unspecified: Secondary | ICD-10-CM

## 2021-07-07 DIAGNOSIS — M47816 Spondylosis without myelopathy or radiculopathy, lumbar region: Secondary | ICD-10-CM | POA: Diagnosis not present

## 2021-07-07 DIAGNOSIS — M5416 Radiculopathy, lumbar region: Secondary | ICD-10-CM | POA: Diagnosis not present

## 2021-07-07 DIAGNOSIS — I5022 Chronic systolic (congestive) heart failure: Secondary | ICD-10-CM | POA: Diagnosis not present

## 2021-07-07 DIAGNOSIS — M4698 Unspecified inflammatory spondylopathy, sacral and sacrococcygeal region: Secondary | ICD-10-CM

## 2021-07-07 DIAGNOSIS — I739 Peripheral vascular disease, unspecified: Secondary | ICD-10-CM

## 2021-07-07 DIAGNOSIS — G894 Chronic pain syndrome: Secondary | ICD-10-CM

## 2021-07-07 DIAGNOSIS — N3289 Other specified disorders of bladder: Secondary | ICD-10-CM

## 2021-07-07 DIAGNOSIS — K6289 Other specified diseases of anus and rectum: Secondary | ICD-10-CM | POA: Diagnosis not present

## 2021-07-07 DIAGNOSIS — R339 Retention of urine, unspecified: Secondary | ICD-10-CM

## 2021-07-07 DIAGNOSIS — C674 Malignant neoplasm of posterior wall of bladder: Secondary | ICD-10-CM | POA: Diagnosis not present

## 2021-07-07 MED ORDER — PROMETHAZINE HCL 12.5 MG PO TABS: 12.5000 mg | ORAL_TABLET | Freq: Three times a day (TID) | ORAL | 2 refills | Status: DC | PRN

## 2021-07-07 MED ORDER — GABAPENTIN 300 MG PO CAPS: 600.0000 mg | ORAL_CAPSULE | Freq: Three times a day (TID) | ORAL | 3 refills | Status: DC

## 2021-07-07 NOTE — Progress Notes (Signed)
? ?Subjective:  ?Patient ID: [name], female    DOB: 02-11-1941  Age: 81 y.o. MRN: [id number] ? ?CC: The primary encounter diagnosis was Bladder distension. 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 ? ? ?This visit occurred during the SARS-CoV-2 public health emergency.  Safety protocols were in place, including screening questions prior to the visit, additional usage of staff PPE, and extensive cleaning of exam room while observing appropriate contact time as indicated for disinfecting solutions.   ? ?HPI ?[name] presents for  ?Chief Complaint  ?Patient presents with  ? Rectal Pain  ? ? ?81 yr old female with history of chronic low back ith radiculopathy secondary to lumbar scoliosis spinal stenosis, managed with oxycodone and spinal cord stimulator,  prescribed  by her pain management specialist  presents with  subacute onset of persistent rectal pain which started in September 2022 with no clear etiology.  She has had 3 ER trips ,  a GI evaluation including a colonoscopy , and tried multiple modalities for pain without significant relief. ? ?She has been currently placed on a "drug holiday." For the  next 4-6 weeks byDr. Holley Raring due to escalating need for narcotics.   Percocet was last refilled on  march 20.  Managed historically with percocet 10 mg up to 5 times daily. Saw Dr Holley Raring on April 12 for same;   H prescribed a The compounded cream  which she states was ineffective and  expensive ($100 for a small tube)   She was. given toradol/norflex injection  , which did not alleviate her pain. Saw [place] on April 14 for same .  Advised to try OTC lidocaine patch 4 % which she states has not helped.  ? ?Also has history of high-grade TA bladder cancer.  This was first noted 1995 and she had a significant recurrence in 2015.   she has had multiple recurrences and failed BCG and intravesical chemo but absolutely would not proceed with cystectomy.  She was noted on recent office cystoscopy October 2022 to have some small recurrences.uroepithelial  bladder CA , s/p partial nephrectomy .  last CT in Sept  2022  noted distended bladder and questioned urinary retention .Marland Kitchen  Per daughter she had a functional bladder evaluation with a post void residual which noted 220 ml of retained urine.  Her last cystoscopy was  in December with new lesions noted.  Has incomplete bladder control, has urge incontinence , no stress symptoms.  ? ?She has seen GI Dr.  Paulita Fujita for rectal pain,  had a  colonoscopy  done at his office in September ,   report not available   but per patient it was normal.  Her rectal pain is constant and not aggravated by or relieved by having a bowel movement. .  ? ?Has not tried the amitriptyline 20 mg dose prescribed by Dr Holley Raring . Taking gabapentin 300 mg tid.   ? ? ?Outpatient Medications Prior to Visit  ?Medication Sig Dispense Refill  ? acetaminophen (TYLENOL) 500 MG tablet Take 1,000 mg by mouth every 8 (eight) hours as needed for moderate pain.    ? amitriptyline (ELAVIL) 10 MG tablet Take 2  tablets (20 mg total) by mouth at bedtime. 60 tablet 3  ? amLODipine (NORVASC) 2.5 MG tablet Take 1 tablet (2.5 mg total) by mouth daily. TAKE 1 TABLET(2.5 MG) BY MOUTH DAILY 30 tablet 4  ? losartan (COZAAR) 100 MG tablet TAKE 1 TABLET(100 MG) BY MOUTH AT BEDTIME 90 tablet 1  ? Menthol, Topical Analgesic, (BIOFREEZE EX) Apply 1 application topically 4 (four) times daily as needed (back pain).    ? metoprolol succinate (TOPROL-XL) 25 MG 24 hr tablet TAKE 1 TABLET(25 MG) BY  MOUTH DAILY 90 tablet 3  ? oxyCODONE-acetaminophen (PERCOCET) 10-325 MG tablet Take 1 tablet by mouth every 4 (four) hours as needed for pain. 150 tablet 0  ? oxyCODONE-acetaminophen (PERCOCET) 10-325 MG tablet Take 1 tablet by mouth every 4 (four) hours as needed for pain. 150 tablet 0  ? pantoprazole (PROTONIX) 40 MG tablet Take 40 mg by mouth at bedtime.    ? polyethylene glycol (MIRALAX / GLYCOLAX) packet Take 17 g by mouth daily as needed for mild constipation.     ? sucralfate (CARAFATE) 1 g tablet TAKE 1 TABLET BY MOUTH ONE HOUR BEFORE EACH MEAL AND TWO HOURS AFTER OTHER MEDICATIONS 90 tablet 1  ? promethazine (PHENERGAN) 12.5 MG tablet Take 1 tablet (12.5 mg total) by mouth every 8 (eight) hours as needed for nausea or vomiting. 30 tablet 2  ? acetaminophen (TYLENOL) 650 MG suppository Place 1 suppository (650 mg total) rectally 2 (two) times daily as needed for mild pain. (Patient not taking: Reported on 07/07/2021) 60 suppository 2  ? NONFORMULARY OR COMPOUNDED ITEM Sig: Apply 1-2 gm(s) (2-4 pumps) to affected area, 3-4 times/day. (1 pump = 0.5 gm) (Patient not taking: Reported on 07/07/2021) 1 each 2  ? gabapentin (NEURONTIN) 300 MG capsule Take 1-2 capsules (300-600 mg total) by mouth 3 (three) times daily. (Patient taking differently: Take 300-600 mg by mouth 3 (three) times daily as needed (pain).) 90 capsule 3  ? Lidocaine-Hydrocortisone Ace 2.8-0.55 % GEL Place 1 application. rectally 2 (two) times daily as needed. (Patient not taking: Reported on 07/07/2021) 100 g 0  ? ?Facility-Administered Medications Prior to Visit  ?Medication Dose Route Frequency Provider Last Rate Last Admin  ? epirubicin (ELLENCE) 50 mg in sodium chloride 0.9 % bladder instillation  50 mg Bladder Instillation Once [name], MD      ? epirubicin (ELLENCE) 80 mg in sodium chloride (PF) 0.9 % bladder instillation  80 mg Bladder Instillation Once Minda Ditto, RPH      ? gemcitabine (GEMZAR) chemo syringe for bladder  instillation 2,000 mg  2,000 mg Bladder Instillation Once [name], MD      ? gemcitabine [facility]) chemo syringe for bladder instillation 2,000 mg  2,000 mg Bladder Instillation Once [name], MD      ? ? ?Review of Systems; ? ?Patient denies headache, fevers, malaise, unintentional weight loss, skin rash, eye pain, sinus congestion and sinus pain, sore throat, dysphagia,  hemoptysis , cough, dyspnea, wheezing, chest pain, palpitations, orthopnea, edema, abdominal pain, nausea, melena, diarrhea, constipation, flank pain, dysuria, hematuria, urinary  Frequency, nocturia, numbness, tingling, seizures,  Focal weakness, Loss of consciousness,  Tremor, insomnia, depression, anxiety, and suicidal ideation.   ? ? ? ?Objective:  ?BP 122/78 (BP Location: Left Arm, Patient Position: Sitting, Cuff Size: Normal)   Pulse 83   Temp 97.9 ?F (36.6 ?C) (Oral)   Ht '5\' 8"'$  (1.727 m)   Wt 116 lb 3.2 oz (52.7 kg)   SpO2  97%   BMI 17.67 kg/m?  ? ?BP Readings from Last 3 Encounters:  ?07/07/21 122/78  ?07/02/21 130/86  ?06/30/21 (!) 146/86  ? ? ?Wt Readings from Last 3 Encounters:  ?07/07/21 116 lb 3.2 oz (52.7 kg)  ?07/02/21 118 lb 9.6 oz (53.8 kg)  ?06/30/21 120 lb (54.4 kg)  ? ? ?General appearance: alert, cooperative and appears stated age ?Ears: normal TM's and external ear canals both ears ?Throat: lips, mucosa, and tongue normal; teeth and gums normal ?Neck: no adenopathy, no carotid bruit, supple, symmetrical, trachea midline and thyroid not enlarged, symmetric, no tenderness/mass/nodules ?Back: symmetric, no curvature. ROM normal. No CVA tenderness. ?Lungs: clear to auscultation bilaterally ?Heart: regular rate and rhythm, S1, S2 normal, no murmur, click, rub or gallop ?Abdomen: soft, non-tender; bowel sounds normal; no masses,  no organomegaly ?Pulses: 2+ and symmetric ?Skin: Skin color, texture, turgor normal. No rashes or lesions ?Lymph nodes: Cervical, supraclavicular, and axillary nodes normal. ? ?Lab  Results  ?Component Value Date  ? HGBA1C 4.6 12/27/2013  ? HGBA1C 5.7 (H) 08/31/2010  ? ? ?Lab Results  ?Component Value Date  ? CREATININE 0.53 03/05/2021  ? CREATININE 0.53 12/09/2020  ? CREATININE 0.52 10/07/18

## 2021-07-07 NOTE — Patient Instructions (Addendum)
You can gradually increase your gabapentin from 1 capsule three times daily to 2 capsules 3 times daily (a total of 600 mg every 8 hours ,  or 6 capsules )  ? ?I will send in a new prescription If you are due  and it will be for the increased amount . ? ?If you do not tolerate the increased amount , resume the former amount/dose of 300  mg and add the 20 mg of amitriptyline  ? ?I have refilled your nausea medication ? ? ?I am ordering a functional bladder  test (ultrasound ) to see if the bladder is pushing on the rectum because of distension .  Our office  or [facility] radiology to set this up  ? ? ?Nupercainal  is available otc and has a fenestrated applicator tip so that the medication can be applied inside the rectum  ? ? ?

## 2021-07-08 ENCOUNTER — Encounter: Payer: Self-pay | Admitting: Student in an Organized Health Care Education/Training Program

## 2021-07-08 NOTE — Assessment & Plan Note (Addendum)
Etiology unclear. She has lumbar spinal stenosis with radiculopathy,  But developed rectal pain not relieved by oxycodone,  dilatiazem gel and gabapentin .  Trial of nupercainal.  If not effective  Need to rule out worsening bladder distension ?

## 2021-07-09 ENCOUNTER — Encounter: Payer: Self-pay | Admitting: Oncology

## 2021-07-09 DIAGNOSIS — R339 Retention of urine, unspecified: Secondary | ICD-10-CM | POA: Insufficient documentation

## 2021-07-09 MED ORDER — DIBUCAINE (PERIANAL) 1 % EX OINT: 1.0000 "application " | TOPICAL_OINTMENT | CUTANEOUS | 2 refills | Status: DC | PRN

## 2021-07-09 NOTE — Assessment & Plan Note (Signed)
Noted in September 2022 during ER evaluation for rectal pain with 220 ml urine retained . Advised to discuss with urology as this may be aggravated by her medications and may be causing her rectal pain

## 2021-07-10 DIAGNOSIS — I482 Chronic atrial fibrillation, unspecified: Secondary | ICD-10-CM | POA: Insufficient documentation

## 2021-07-10 DIAGNOSIS — M4698 Unspecified inflammatory spondylopathy, sacral and sacrococcygeal region: Secondary | ICD-10-CM | POA: Insufficient documentation

## 2021-07-10 NOTE — Assessment & Plan Note (Signed)
Apparently (per daughter), she had a post void residual done by Urology after the Sept 2022 CT suggested  retenttion due to distended bladder,  Her PVR was reportedly 220 ml. Her bladder distension may be contributing to her rectal pain if her bladder is chronically distended and putting pressure on the  Rectum. Advised to follow up with Urology ?

## 2021-07-13 ENCOUNTER — Ambulatory Visit: Payer: Medicare Other

## 2021-07-14 NOTE — Progress Notes (Signed)
Remote ICD transmission.   

## 2021-07-14 NOTE — Addendum Note (Signed)
Addended by: Cheri Kearns A on: 07/14/2021 09:34 AM ? ? Modules accepted: Level of Service ? ?

## 2021-07-29 ENCOUNTER — Ambulatory Visit: Payer: Medicare Other | Admitting: Cardiovascular Disease

## 2021-07-29 ENCOUNTER — Encounter: Payer: Self-pay | Admitting: Student in an Organized Health Care Education/Training Program

## 2021-07-29 ENCOUNTER — Ambulatory Visit
Payer: No Typology Code available for payment source | Attending: Student in an Organized Health Care Education/Training Program | Admitting: Student in an Organized Health Care Education/Training Program

## 2021-07-29 VITALS — BP 160/96 | HR 83 | Temp 97.6°F | Resp 16 | Ht 68.0 in | Wt 118.0 lb

## 2021-07-29 DIAGNOSIS — Z9689 Presence of other specified functional implants: Secondary | ICD-10-CM

## 2021-07-29 DIAGNOSIS — M47818 Spondylosis without myelopathy or radiculopathy, sacral and sacrococcygeal region: Secondary | ICD-10-CM

## 2021-07-29 DIAGNOSIS — G894 Chronic pain syndrome: Secondary | ICD-10-CM

## 2021-07-29 DIAGNOSIS — M47816 Spondylosis without myelopathy or radiculopathy, lumbar region: Secondary | ICD-10-CM | POA: Diagnosis not present

## 2021-07-29 DIAGNOSIS — M48062 Spinal stenosis, lumbar region with neurogenic claudication: Secondary | ICD-10-CM

## 2021-07-29 DIAGNOSIS — M5416 Radiculopathy, lumbar region: Secondary | ICD-10-CM | POA: Diagnosis not present

## 2021-07-29 MED ORDER — OXYCODONE-ACETAMINOPHEN 10-325 MG PO TABS: 1.0000 | ORAL_TABLET | ORAL | 0 refills | Status: DC | PRN

## 2021-07-29 NOTE — Progress Notes (Signed)
PROVIDER NOTE: Information contained herein reflects review and annotations entered in association with encounter. Interpretation of such information and data should be left to medically-trained personnel. Information provided to patient can be located elsewhere in the medical record under "Patient Instructions". Document created using STT-dictation technology, any transcriptional errors that may result from process are unintentional.  ?  ?Patient: [name]  Service Category: E/M  Provider: [name], MD  ?DOB: [birth year]  DOS: 07/29/2021  Specialty: Interventional Pain Management  ?MRN: [id number]  Setting: Ambulatory outpatient  PCP: [name], MD  ?Type: Established Patient    Referring Provider: [name], MD  ?Location: Office  Delivery: Face-to-face    ? ?HPI  ?[name], a 81 y.o. year old female, is here today because of her Spinal stenosis, lumbar region, with neurogenic claudication [M48.062]. [name] primary complain today is Pain (rectal) ?Last encounter: My last encounter with her was on  ?Pertinent problems: [name] has Spinal stenosis of thoracolumbar region; Spinal stenosis in cervical region; Spinal stenosis, lumbar region, with neurogenic claudication; Lumbar spondylosis; SI joint arthritis; Lumbar degenerative disc disease; Chronic pain syndrome; Lumbar radiculopathy; Lumbar facet arthropathy; and Back pain on their pertinent problem list. ?Pain Assessment: Severity of Chronic pain is reported as a 10-Worst pain ever/10. Location: Other (Comment) (Rectal) Lower, Right, Left/into hips and buttocks, "up my spine 3 inches", and to upper thighs bilat. Onset: More than a month ago. Quality: Dull, Constant. Timing: Constant. Modifying factor(s): meds, laying down. ?Vitals:  height is '5\' 8"'$  (1.727 m) and weight is 118 lb (53.5 kg). Her temporal temperature is 97.6 ?F (36.4 ?C). Her blood pressure is 160/96 (abnormal) and her pulse is 83. Her respiration is 16 and oxygen  saturation is 99%.  ? ?Reason for encounter: medication management.  ? ?Tracey follows up today for medication management.  She finally decided to pursue a drug holiday and decrease her dose.  She reduced her oxycodone down to every 6 hours for 2 weeks then decrease to every 8 hours and is currently at twice a day.  She states that this has been difficult and she is experiencing increased pain.  I did commend her on her decision to pursue a drug holiday because in the long run I think this is going to be more effective in decreasing central sensitization and her associated tolerance.  I instructed her to continue her oxycodone at 10 mg every 12 hours for the next 7 to 10 days and then to start increasing back to every 8 hours and see how she does with that.  Patient is in agreement with plan.  She also inquired about medical marijuana but I advised against this given that we are in [place] and that she is on concomitant opioid therapy.  We will renew our annual urine toxicology screen today for medication compliance monitoring. ? ? ?Pharmacotherapy Assessment  ?Analgesic: Percocet 10 mg up to 5 times a day as needed for severe breakthrough pain, quantity 150/month; MME equals 75  ? ?Monitoring: ?[place] PMP: PDMP reviewed during this encounter.       ?Pharmacotherapy: No side-effects or adverse reactions reported. ?Compliance: No problems identified. ?Effectiveness: Clinically acceptable. ? ?[name], RN  07/29/2021  2:40 PM  Sign when Signing Visit ?Nursing Pain Medication Assessment:  ?Safety precautions to be maintained throughout the outpatient stay will include: orient to surroundings, keep bed in low position, maintain call bell within reach at all times, provide assistance with transfer out  of bed and ambulation.  ?Medication Inspection Compliance: Pill count conducted under aseptic conditions, in front of the patient. Neither the pills nor the bottle was removed from the patient's sight at any time.  Once count was completed pills were immediately returned to the patient in their original bottle. ? ?Medication: Oxycodone/APAP ?Pill/Patch Count:  88 of 150 pills remain ?Pill/Patch Appearance: Markings consistent with prescribed medication ?Bottle Appearance: Standard pharmacy container. Clearly labeled. ?Filled Date: 04 / 19 / 2023 ?Last Medication intake:  Today ?  ?  UDS:  ?Summary  ?Date Value Ref Range Status  ?08/11/2020 Note  Final  ?  Comment:  ?  ==================================================================== ?[employer] Select 13 (MW) ?==================================================================== ?Test                             Result       Flag       Units ? ?Drug Present and Declared for Prescription Verification ?  Oxycodone                      4370         EXPECTED   ng/mg creat ?  Oxymorphone                    8753         EXPECTED   ng/mg creat ?  Noroxycodone                   12500        EXPECTED   ng/mg creat ?  Noroxymorphone                 2957         EXPECTED   ng/mg creat ?   Sources of oxycodone are scheduled prescription medications. ?   Oxymorphone, noroxycodone, and noroxymorphone are expected ?   metabolites of oxycodone. Oxymorphone is also available as a ?   scheduled prescription medication. ? ?==================================================================== ?Test                      Result    Flag   Units      Ref Range ?  Creatinine              30               mg/dL      >=20 ?==================================================================== ?Declared Medications: ? The flagging and interpretation on this report are based on the ? following declared medications.  Unexpected results may arise from ? inaccuracies in the declared medications. ? ? **Note: The testing scope of this panel includes these medications: ? ? Oxycodone (Percocet) ? ? **Note: The testing scope of this panel does not include the ? following reported medications: ? ? Acetaminophen ?  Acetaminophen (Percocet) ? Amitriptyline ? Amlodipine (Norvasc) ? Gabapentin ? Losartan ? Menthol ? Metoprolol ? Polyethylene Glycol (MiraLAX) ? Promethazine ? Rosuvastatin ? Sucralfate (Carafate) ?==================================================================== ?For clinical consultation, please call [phone]. ?==================================================================== ?  ?  ? ?ROS  ?Constitutional: Denies any fever or chills ?Gastrointestinal: No reported hemesis, hematochezia, vomiting, or acute GI distress ?Musculoskeletal:  Low back, rectal pain ?Neurological: No reported episodes of acute onset apraxia, aphasia, dysarthria, agnosia, amnesia, paralysis, loss of coordination, or loss of consciousness ? ?Medication Review  ?Menthol (Topical Analgesic), acetaminophen, amLODipine, amitriptyline, dibucaine, gabapentin, losartan, metoprolol succinate, oxyCODONE-acetaminophen, pantoprazole, polyethylene glycol, promethazine, and sucralfate ? ?  History Review  ?Allergy: [name] has No Known Allergies. ?Drug: [name]  reports no history of drug use. ?Alcohol:  reports no history of alcohol use. ?Tobacco:  reports that she has been smoking cigarettes. She has a 14.00 pack-year smoking history. She has never used smokeless tobacco. ?Social: [name]  reports that she has been smoking cigarettes. She has a 14.00 pack-year smoking history. She has never used smokeless tobacco. She reports that she does not drink alcohol and does not use drugs. ?Medical:  has a past medical history of AICD (automatic cardioverter/defibrillator) present (EP cardiologist--- dr Caryl Comes ), Arthritis, Bladder cancer [facility]) (urologist-  dr Junious Silk), Chronic hyponatremia, Chronic low back pain with bilateral sciatica, Chronic systolic (congestive) heart failure ([place]), COPD with emphysema ([place]), Coronary artery disease (cardiologist-  dr Kathlyn [place]), DDD (degenerative disc disease), thoracolumbar, Frequent urination, Full  dentures, Gait instability, GERD (gastroesophageal reflux disease), History of iron deficiency anemia (11/2013), History of stomach ulcers (11/2013), Hypertension, LBBB (left bundle branch block), NICM (nonischemi

## 2021-07-29 NOTE — Progress Notes (Signed)
Nursing Pain Medication Assessment:  ?Safety precautions to be maintained throughout the outpatient stay will include: orient to surroundings, keep bed in low position, maintain call bell within reach at all times, provide assistance with transfer out of bed and ambulation.  ?Medication Inspection Compliance: Pill count conducted under aseptic conditions, in front of the patient. Neither the pills nor the bottle was removed from the patient's sight at any time. Once count was completed pills were immediately returned to the patient in their original bottle. ? ?Medication: Oxycodone/APAP ?Pill/Patch Count:  88 of 150 pills remain ?Pill/Patch Appearance: Markings consistent with prescribed medication ?Bottle Appearance: Standard pharmacy container. Clearly labeled. ?Filled Date: 04 / 19 / 2023 ?Last Medication intake:  Today ?

## 2021-07-30 ENCOUNTER — Ambulatory Visit (INDEPENDENT_AMBULATORY_CARE_PROVIDER_SITE_OTHER): Payer: Medicare Other

## 2021-07-30 DIAGNOSIS — I428 Other cardiomyopathies: Secondary | ICD-10-CM | POA: Diagnosis not present

## 2021-07-30 DIAGNOSIS — I5022 Chronic systolic (congestive) heart failure: Secondary | ICD-10-CM

## 2021-07-30 LAB — CUP PACEART REMOTE DEVICE CHECK
Battery Remaining Longevity: 1 mo
Battery Voltage: 2.72 V
Brady Statistic AP VP Percent: 43.07 %
Brady Statistic AP VS Percent: 0.73 %
Brady Statistic AS VP Percent: 54.72 %
Brady Statistic AS VS Percent: 1.49 %
Brady Statistic RA Percent Paced: 42.83 %
Brady Statistic RV Percent Paced: 94.03 %
Date Time Interrogation Session: 20230512001604
HighPow Impedance: 71 Ohm
Implantable Lead Implant Date: 20160217
Implantable Lead Implant Date: 20160217
Implantable Lead Implant Date: 20160217
Implantable Lead Location: 753858
Implantable Lead Location: 753859
Implantable Lead Location: 753860
Implantable Lead Model: 4298
Implantable Lead Model: 5076
Implantable Pulse Generator Implant Date: 20160217
Lead Channel Impedance Value: 1007 Ohm
Lead Channel Impedance Value: 399 Ohm
Lead Channel Impedance Value: 418 Ohm
Lead Channel Impedance Value: 418 Ohm
Lead Channel Impedance Value: 456 Ohm
Lead Channel Impedance Value: 456 Ohm
Lead Channel Impedance Value: 513 Ohm
Lead Channel Impedance Value: 551 Ohm
Lead Channel Impedance Value: 608 Ohm
Lead Channel Impedance Value: 665 Ohm
Lead Channel Impedance Value: 703 Ohm
Lead Channel Impedance Value: 893 Ohm
Lead Channel Impedance Value: 931 Ohm
Lead Channel Pacing Threshold Amplitude: 0.5 V
Lead Channel Pacing Threshold Amplitude: 0.875 V
Lead Channel Pacing Threshold Amplitude: 1.375 V
Lead Channel Pacing Threshold Pulse Width: 0.4 ms
Lead Channel Pacing Threshold Pulse Width: 0.4 ms
Lead Channel Pacing Threshold Pulse Width: 0.4 ms
Lead Channel Sensing Intrinsic Amplitude: 2.25 mV
Lead Channel Sensing Intrinsic Amplitude: 2.25 mV
Lead Channel Sensing Intrinsic Amplitude: 22.875 mV
Lead Channel Sensing Intrinsic Amplitude: 22.875 mV
Lead Channel Setting Pacing Amplitude: 2 V
Lead Channel Setting Pacing Amplitude: 2.5 V
Lead Channel Setting Pacing Amplitude: 2.5 V
Lead Channel Setting Pacing Pulse Width: 0.4 ms
Lead Channel Setting Pacing Pulse Width: 0.4 ms
Lead Channel Setting Sensing Sensitivity: 0.45 mV

## 2021-08-02 ENCOUNTER — Telehealth: Payer: Self-pay

## 2021-08-02 NOTE — Telephone Encounter (Signed)
MDT alert, device has reached RRT 5/15 ? ?Pt aware and confirmed f/u with Dr. Caryl Comes to discuss gen change. ? ?Pt states her device made a little musical sound today.  Advised that was her device letting her know she needed a new battery.  It did not bother Pt.  She will keep follow up as sheduled. ? ? ?

## 2021-08-03 ENCOUNTER — Encounter: Payer: Medicare Other | Admitting: Internal Medicine

## 2021-08-05 NOTE — Progress Notes (Signed)
Remote ICD transmission.   

## 2021-08-10 ENCOUNTER — Ambulatory Visit (INDEPENDENT_AMBULATORY_CARE_PROVIDER_SITE_OTHER): Payer: Medicare Other | Admitting: Internal Medicine

## 2021-08-10 ENCOUNTER — Encounter: Payer: Self-pay | Admitting: Internal Medicine

## 2021-08-10 VITALS — BP 150/96 | HR 70 | Ht 68.0 in | Wt 117.5 lb

## 2021-08-10 DIAGNOSIS — I5022 Chronic systolic (congestive) heart failure: Secondary | ICD-10-CM | POA: Diagnosis not present

## 2021-08-10 DIAGNOSIS — I428 Other cardiomyopathies: Secondary | ICD-10-CM

## 2021-08-10 DIAGNOSIS — I447 Left bundle-branch block, unspecified: Secondary | ICD-10-CM

## 2021-08-10 DIAGNOSIS — Z95810 Presence of automatic (implantable) cardiac defibrillator: Secondary | ICD-10-CM | POA: Diagnosis not present

## 2021-08-10 DIAGNOSIS — I493 Ventricular premature depolarization: Secondary | ICD-10-CM | POA: Diagnosis not present

## 2021-08-10 DIAGNOSIS — I10 Essential (primary) hypertension: Secondary | ICD-10-CM

## 2021-08-10 NOTE — Progress Notes (Signed)
Electrophysiology Office Note   Date:  08/10/2021   ID:  [name], [name] 10-09-1940, MRN [id number]  PCP:  [name], MD  Cardiologist:  MA Primary Electrophysiologist:  Virl Axe, MD    Chief Complaint  Patient presents with   Follow-up    6 month F/U-No new cardiac concerns     History of Present Illness: [name] is a 81 y.o. female seen in followup  for CRT-D implanted 2/16.   She has a history of a nonischemic cardiomyopathy with ejection fraction 25% and left bundle branch block.  She also had tracings in the hospital with the operating QRS in lead V1 suggesting alternating bundle branch block; however those times, the limb leads still demonstrated left bundle branch block  The patient denies chest pain, nocturnal dyspnea, orthopnea or peripheral edema.  There have been no palpitations, lightheadedness or syncope.  Complains of  .     DATE TEST EF   9/15 Myoview 28%   1/16 Cath   % CA min disease  10/17 Echo   55-65 %   6/20 Echo  60-65%     Date Cr K Hgb  7/17 0.55 4.1    1/19 0.73 4.9    3/20 0.8 4.1 12.5  11/20 0.5 4.2 14.3  12/22 0.53 4.9 13.5    At [facility] implantation of a neurostimulator for back pain assoc with new pains ( [facility] note 03/08/19) but back pain is better; she remains on narcotics with inadequate relief.    Past Medical History:  Diagnosis Date   AICD (automatic cardioverter/defibrillator) present EP cardiologist--- dr Caryl Comes    placement 05-07-2014 , ef 25%,  NICM/  (02-11-2019 last echo 08-31-2018 ef 60-65%)   Arthritis    "in about all my joints; for sure in my back"   Bladder cancer [facility]) urologist-  dr Junious Silk   dx 1995--  recurrent bladder cancer 2015 , s/p TURBT's and chemo instillation's ;   04/ 2019  s/p TURBT   Chronic hyponatremia    Chronic low back pain with bilateral sciatica    s/p  spinal cord stimulator @ [facility]  [phone]   Chronic systolic (congestive) heart failure [facility])    cardiologist-  dr Rogue Jury    COPD with emphysema ([place])    (02-11-2019  per pt has never been on oxygen)   Coronary artery disease cardiologist-  dr Kathlyn [place]   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% per cardiac cath 03-03-2014   DDD (degenerative disc disease), thoracolumbar    Frequent urination    Full dentures    Gait instability    due to chronic low back pain, uses roller walker   GERD (gastroesophageal reflux disease)    History of iron deficiency anemia 11/2013   resolved w/ IV Iron  (02-11-2019  per pt has not had any issues since 2015)   History of stomach ulcers 11/2013   Hypertension    LBBB (left bundle branch block)    NICM (nonischemic cardiomyopathy) ([place]) last echo 12-31-2015 ef 55-60%   dx 09/ 2015 per echo 20%;  myoview 09/ 2015 ef 28%;  per cardiac cath 12/ 2015 ef 30%;     Nocturia more than twice per night    S/P insertion of spinal cord stimulator followed by [facility]---- Dr Pecola Leisure (notes in care everywhere)   10-09-2018  '@[facility]'$ --- thoracic spinal cord stimular/ generator  (device from [facility])---- per pt has a control   Scoliosis  Uses walker 02/18/2021   all the time   Wears glasses    for reading   Past Surgical History:  Procedure Laterality Date   BI-VENTRICULAR IMPLANTABLE CARDIOVERTER DEFIBRILLATOR N/A 05/07/2014   Procedure: BI-VENTRICULAR IMPLANTABLE CARDIOVERTER DEFIBRILLATOR  (CRT-D);  Surgeon: Deboraha Sprang, MD;  Location: [facility] CATH LAB;  Service: Cardiovascular;  Laterality: N/A;   CARDIAC CATHETERIZATION  03-03-2014  dr Kathlyn [place]   [facility]   pLAD 20%, pRCA 20%, dRCA 50%, RPLS 50%;  ef 30%, mild elevated LVEDP, mild gradient across aortic valve LVOT   CARDIOVASCULAR STRESS TEST  11/25/2013   High risk nuclear study w/ large high severity inferior wall perfusion defect on stress and rest images, large mild severity anteroseptal wall perfusion defect on stress and rest images, No inducible ischemia/ global moderate hypokinesis, ef 28%   CATARACT EXTRACTION W/ INTRAOCULAR  LENS  IMPLANT, BILATERAL Bilateral right 12-2013 / left  02-2014   colonscopy  11/2020   benign polyps removed   CYSTOSCOPY W/ RETROGRADES Bilateral 01/06/2015   Procedure: CYSTOSCOPY WITH  BLADDER BIOPSY BILATERAL RETROGRADE PYELOGRAM,INSTILLATION OF MITOMYCIN C;  Surgeon: [name], MD;  Location: [facility]S;  Service: Urology;  Laterality: Bilateral;   CYSTOSCOPY WITH BIOPSY N/A 10/06/2015   Procedure: CYSTO WITH BLADDER BIOPSY, FULGERATION, CHEMO IRRIGATION EPIRUBICIN IN PACU;  Surgeon: [name], MD;  Location: [facility];  Service: Urology;  Laterality: N/A;   CYSTOSCOPY WITH BIOPSY N/A 02/12/2019   Procedure: CYSTOSCOPY WITH BIOPSY/ FULGURATION/ INSTILLATION OF GEMCITABINE, bilateral retrograde turbt greater 5cm;  Surgeon: [name], MD;  Location: [facility];  Service: Urology;  Laterality: N/A;   CYSTOSCOPY WITH BIOPSY N/A 03/19/2021   Procedure: CYSTOSCOPY WITH TRANSURETHRAL RESECTION OF BLADDER TUMOR  GREATER THAN 5CM WITH POST-OPERATIVE INSTILLATION OF EPIRUBICIN;  Surgeon: [name], MD;  Location: [facility];  Service: Urology;  Laterality: N/A;   CYSTOSCOPY WITH FULGERATION N/A 06/30/2017   Procedure: Marland Kitchen CYSTOSCOPY WITH [place] FULGERATION/ BLADDER BIOPSY/ INSTILLATION OF EPIRUBICIN;  Surgeon: [name], MD;  Location: [facility];  Service: Urology;  Laterality: N/A;   CYSTOSCOPY WITH RETROGRADE PYELOGRAM, URETEROSCOPY AND STENT PLACEMENT Bilateral 04/18/2014   Procedure: CYSTOSCOPY WITH RETROGRADE PYELOGRAM;  Surgeon: [name], MD;  Location: [facility];  Service: Urology;  Laterality: Bilateral;   ESOPHAGOGASTRODUODENOSCOPY N/A 11/28/2013   Procedure: ESOPHAGOGASTRODUODENOSCOPY (EGD);  Surgeon: Arta Silence, MD;  Location: [facility] ENDOSCOPY;  Service: Endoscopy;  Laterality: N/A;   LUMBAR DISC SURGERY  1980's   "ruptured disc"   SPINAL CORD STIMULATOR IMPLANT  10-09-2018   '@[facility]'$    Thoracic spinal cord stimular/  genertor  (left flank)----- (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[employer])   TRANSTHORACIC ECHOCARDIOGRAM  12/31/2015   dr Caryl Comes   ef 25-852, grade 1 diastolic dysfunction/ mild MR/ septal motion showed abnormal function and dyssynergy   TRANSURETHRAL RESECTION OF BLADDER  1995   TRANSURETHRAL RESECTION OF BLADDER TUMOR N/A 12/13/2013   Procedure: TRANSURETHRAL RESECTION OF BLADDER TUMOR (TURBT);  Surgeon: [name], MD;  Location: [facility];  Service: Urology;  Laterality: N/A;   TRANSURETHRAL RESECTION OF BLADDER TUMOR N/A 01/17/2014   Procedure: TRANSURETHRAL RESECTION OF BLADDER TUMOR (TURBT);  Surgeon: [name], MD;  Location: [facility];  Service: Urology;  Laterality: N/A;   TRANSURETHRAL RESECTION OF BLADDER TUMOR N/A 04/18/2014   Procedure: TRANSURETHRAL RESECTION OF BLADDER TUMOR (TURBT), CYSTOGRAM;  Surgeon: [name], MD;  Location: [facility];  Service: Urology;  Laterality: N/A;   TRANSURETHRAL RESECTION OF BLADDER TUMOR N/A 12/13/2019   Procedure:  TRANSURETHRAL RESECTION OF BLADDER TUMOR (TURBT) GREATER THAN 5CM WITH CYSTOSCOPY/ POST OPERATIVE INSTILLATION OF GEMCITABINE;  Surgeon: [name], MD;  Location: [facility];  Service: Urology;  Laterality: N/A;   TRANSURETHRAL RESECTION OF BLADDER TUMOR N/A 10/06/2020   Procedure: TRANSURETHRAL RESECTION OF BLADDER TUMOR (TURBT)/ CYSTOSCOPY/  BLADDER BIOPSY;  Surgeon: [name], MD;  Location: [facility];  Service: Urology;  Laterality: N/A;     Current Outpatient Medications  Medication Sig Dispense Refill   acetaminophen (TYLENOL) 500 MG tablet Take 1,000 mg by mouth every 8 (eight) hours as needed for moderate pain.     amitriptyline (ELAVIL) 10 MG tablet Take 2 tablets (20 mg total) by mouth at bedtime. 60 tablet 3   amLODipine (NORVASC) 2.5 MG tablet Take 1 tablet (2.5 mg total) by mouth daily. TAKE 1 TABLET(2.5 MG) BY MOUTH DAILY 30 tablet 4   dibucaine (NUPERCAINAL) 1 % OINT Place 1 application. rectally as  needed for hemorrhoids. Pharmacy please provide fenestrated applicator for application 28 g 2   gabapentin (NEURONTIN) 300 MG capsule Take 2 capsules (600 mg total) by mouth 3 (three) times daily. 180 capsule 3   losartan (COZAAR) 100 MG tablet TAKE 1 TABLET(100 MG) BY MOUTH AT BEDTIME 90 tablet 1   Menthol, Topical Analgesic, (BIOFREEZE EX) Apply 1 application topically 4 (four) times daily as needed (back pain).     metoprolol succinate (TOPROL-XL) 25 MG 24 hr tablet TAKE 1 TABLET(25 MG) BY MOUTH DAILY 90 tablet 3   [START ON 09/03/2021] oxyCODONE-acetaminophen (PERCOCET) 10-325 MG tablet Take 1 tablet by mouth every 4 (four) hours as needed for pain. 150 tablet 0   [START ON 11/03/2021] oxyCODONE-acetaminophen (PERCOCET) 10-325 MG tablet Take 1 tablet by mouth every 4 (four) hours as needed for pain. 150 tablet 0   [START ON 10/03/2021] oxyCODONE-acetaminophen (PERCOCET) 10-325 MG tablet Take 1 tablet by mouth every 4 (four) hours as needed for pain. 150 tablet 0   pantoprazole (PROTONIX) 40 MG tablet Take 40 mg by mouth at bedtime.     polyethylene glycol (MIRALAX / GLYCOLAX) packet Take 17 g by mouth daily as needed for mild constipation.      promethazine (PHENERGAN) 12.5 MG tablet Take 1 tablet (12.5 mg total) by mouth every 8 (eight) hours as needed for nausea or vomiting. 30 tablet 2   sucralfate (CARAFATE) 1 g tablet TAKE 1 TABLET BY MOUTH ONE HOUR BEFORE EACH MEAL AND TWO HOURS AFTER OTHER MEDICATIONS 90 tablet 1    Allergies:   Patient has no known allergies.   Social History:  The patient  reports that she has been smoking cigarettes. She has a 14.00 pack-year smoking history. She has never used smokeless tobacco. She reports that she does not drink alcohol and does not use drugs.   Family History:  The patient's *family history includes Cancer in her brother and mother.    ROS:  Please see the history of present illness.  .   All other systems are reviewed and negative.    PHYSICAL  EXAM: VS:  BP (!) 150/96 (BP Location: Left Arm, Patient Position: Sitting, Cuff Size: Normal)   Pulse 70   Ht '5\' 8"'$  (1.727 m)   Wt 117 lb 8 oz (53.3 kg)   SpO2 97%   BMI 17.87 kg/m  , BMI Body mass index is 17.87 kg/m. Well developed and well nourished in no acute distress HENT normal Neck supple with JVP-flat Clear Device pocket well healed; without hematoma  or erythema.  There is no tethering  Regular rate and rhythm, no  gallop No / murmur Abd-soft with active BS No Clubbing cyanosis edema Skin-warm and dry A & Oriented  Grossly normal sensory and motor function  ECG P synchronous pacing with an upright QRS lead V1 and a negative QRS lead I and a QRSd of 118      ASSESSMENT AND PLAN:  Cardiomyopathy nonischemic  Congestive heart failure-chronic systolic  COPD/emphysema  Hypertension  Left bundle branch block  PVCs-infrequent  Sinus bradycardia-borderline  CRT-D [employer]    T wave oversensing resulting in P wave being in refractory and thus not tracked  Gait instability    Blood pressure is elevated.  Does not know what it is at home.  We will have her check.  She is having significant problems with back pain so we will hold off on making any adjustments at this point and continue her on her losartan 100 amlodipine 2-1/2 and metoprolol 25  Her device has reached ERI.  Lengthy discussion regarding end-of-life and whether replace her device with high-voltage CRT-D or low voltage.  Discussed the distinctions between DNR advanced directives and the importance of clarifying her end-of-life wishes with her family.  She will have these discussions to let us know how it is that she would like to proceed with high-voltage versus low voltage replacement.    Signed, Virl Axe, MD  08/10/2021 1:07 PM     [facility] [address] [place] [address] [phone] (office) [phone] (fax)

## 2021-08-10 NOTE — Patient Instructions (Addendum)
Medication Instructions:  - Your physician recommends that you continue on your current medications as directed. Please refer to the Current Medication list given to you today.  *If you need a refill on your cardiac medications before your next appointment, please call your pharmacy*   Lab Work: - none ordered  If you have labs (blood work) drawn today and your tests are completely normal, you will receive your results only by: [place] (if you have MyChart) OR A paper copy in the mail If you have any lab test that is abnormal or we need to change your treatment, we will call you to review the results.   Testing/Procedures: - Please call the office at [phone]/ send a MyChart message, if/ when you decide to have your battery changed out on your device.   If we do not hear from you by mid July, Dr. Olin Pia nurse will reach out to your to follow up.    Follow-Up: At [facility], you and your health needs are our priority.  As part of our continuing mission to provide you with exceptional heart care, we have created designated Provider Care Teams.  These Care Teams include your primary Cardiologist (physician) and Advanced Practice Providers (APPs -  Physician Assistants and Nurse Practitioners) who all work together to provide you with the care you need, when you need it.  We recommend signing up for the patient portal called "MyChart".  Sign up information is provided on this After Visit Summary.  MyChart is used to connect with patients for Virtual Visits (Telemedicine).  Patients are able to view lab/test results, encounter notes, upcoming appointments, etc.  Non-urgent messages can be sent to your provider as well.   To learn more about what you can do with MyChart, go to [email or web address].    Your next appointment:   Pending device replacement   The format for your next appointment:   In Person  Provider:   Virl Axe, MD    Other  Instructions N/a  Important Information About Sugar

## 2021-08-11 ENCOUNTER — Other Ambulatory Visit: Payer: Self-pay | Admitting: Internal Medicine

## 2021-08-11 ENCOUNTER — Ambulatory Visit (INDEPENDENT_AMBULATORY_CARE_PROVIDER_SITE_OTHER): Payer: Medicare Other

## 2021-08-11 VITALS — Ht 68.0 in | Wt 117.0 lb

## 2021-08-11 DIAGNOSIS — Z0000 Encounter for general adult medical examination without abnormal findings: Secondary | ICD-10-CM | POA: Diagnosis not present

## 2021-08-11 NOTE — Progress Notes (Signed)
Subjective:   [name] is a 81 y.o. female who presents for Medicare Annual (Subsequent) preventive examination.  Review of Systems    No ROS.  Medicare Wellness Virtual Visit.  Visual/audio telehealth visit, UTA vital signs.   See social history for additional risk factors.         Objective:    Today's Vitals   08/11/21 1504  Weight: 117 lb (53.1 kg)  Height: '5\' 8"'$  (1.727 m)   Body mass index is 17.79 kg/m.     08/11/2021    3:12 PM 03/19/2021   12:16 PM 03/05/2021    2:05 PM 02/04/2021   12:44 PM 10/02/2020    9:44 AM 08/11/2020   10:53 AM 08/06/2020    1:27 PM  Advanced Directives  Does Patient Have a Medical Advance Directive? Yes Yes Yes Yes Yes Yes Yes  Type of Paramedic of [place];Living will [place];Living will [place];Living will [place];Living will [place];Living will  [place];Living will  Does patient want to make changes to medical advance directive? No - Patient declined No - Patient declined     No - Patient declined  Copy of [place] in Chart? Yes - validated most recent copy scanned in chart (See row information) No - copy requested  No - copy requested Yes - validated most recent copy scanned in chart (See row information)  Yes - validated most recent copy scanned in chart (See row information)  Would patient like information on creating a medical advance directive?  No - Patient declined         Current Medications (verified) Outpatient Encounter Medications as of 08/11/2021  Medication Sig   acetaminophen (TYLENOL) 500 MG tablet Take 1,000 mg by mouth every 8 (eight) hours as needed for moderate pain.   amitriptyline (ELAVIL) 10 MG tablet Take 2 tablets (20 mg total) by mouth at bedtime.   amLODipine (NORVASC) 2.5 MG tablet Take 1 tablet (2.5 mg total) by mouth daily. TAKE 1 TABLET(2.5 MG) BY MOUTH  DAILY   dibucaine (NUPERCAINAL) 1 % OINT Place 1 application. rectally as needed for hemorrhoids. Pharmacy please provide fenestrated applicator for application   gabapentin (NEURONTIN) 300 MG capsule Take 2 capsules (600 mg total) by mouth 3 (three) times daily.   Menthol, Topical Analgesic, (BIOFREEZE EX) Apply 1 application topically 4 (four) times daily as needed (back pain).   metoprolol succinate (TOPROL-XL) 25 MG 24 hr tablet TAKE 1 TABLET(25 MG) BY MOUTH DAILY   [START ON 09/03/2021] oxyCODONE-acetaminophen (PERCOCET) 10-325 MG tablet Take 1 tablet by mouth every 4 (four) hours as needed for pain.   [START ON 11/03/2021] oxyCODONE-acetaminophen (PERCOCET) 10-325 MG tablet Take 1 tablet by mouth every 4 (four) hours as needed for pain.   [START ON 10/03/2021] oxyCODONE-acetaminophen (PERCOCET) 10-325 MG tablet Take 1 tablet by mouth every 4 (four) hours as needed for pain.   pantoprazole (PROTONIX) 40 MG tablet Take 40 mg by mouth at bedtime.   polyethylene glycol (MIRALAX / GLYCOLAX) packet Take 17 g by mouth daily as needed for mild constipation.    promethazine (PHENERGAN) 12.5 MG tablet Take 1 tablet (12.5 mg total) by mouth every 8 (eight) hours as needed for nausea or vomiting.   sucralfate (CARAFATE) 1 g tablet TAKE 1 TABLET BY MOUTH ONE HOUR BEFORE EACH MEAL AND TWO HOURS AFTER OTHER MEDICATIONS   [DISCONTINUED] losartan (COZAAR) 100 MG  tablet TAKE 1 TABLET(100 MG) BY MOUTH AT BEDTIME   Facility-Administered Encounter Medications as of 08/11/2021  Medication   epirubicin (ELLENCE) 50 mg in sodium chloride 0.9 % bladder instillation   epirubicin (ELLENCE) 80 mg in sodium chloride (PF) 0.9 % bladder instillation   gemcitabine (GEMZAR) chemo syringe for bladder instillation 2,000 mg   gemcitabine (GEMZAR) chemo syringe for bladder instillation 2,000 mg    Allergies (verified) Patient has no known allergies.   History: Past Medical History:  Diagnosis Date   AICD (automatic  cardioverter/defibrillator) present EP cardiologist--- dr Caryl Comes    placement 05-07-2014 , ef 25%,  NICM/  (02-11-2019 last echo 08-31-2018 ef 60-65%)   Arthritis    "in about all my joints; for sure in my back"   Bladder cancer [facility]) urologist-  dr Junious Silk   dx 1995--  recurrent bladder cancer 2015 , s/p TURBT's and chemo instillation's ;   04/ 2019  s/p TURBT   Chronic hyponatremia    Chronic low back pain with bilateral sciatica    s/p  spinal cord stimulator @ [facility]  [id number]   Chronic systolic (congestive) heart failure [facility])    cardiologist-  dr Rogue Jury   COPD with emphysema ([place])    (02-11-2019  per pt has never been on oxygen)   Coronary artery disease cardiologist-  dr Kathlyn [place]   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% per cardiac cath 03-03-2014   DDD (degenerative disc disease), thoracolumbar    Frequent urination    Full dentures    Gait instability    due to chronic low back pain, uses roller walker   GERD (gastroesophageal reflux disease)    History of iron deficiency anemia 11/2013   resolved w/ IV Iron  (02-11-2019  per pt has not had any issues since 2015)   History of stomach ulcers 11/2013   Hypertension    LBBB (left bundle branch block)    NICM (nonischemic cardiomyopathy) ([place]) last echo 12-31-2015 ef 55-60%   dx 09/ 2015 per echo 20%;  myoview 09/ 2015 ef 28%;  per cardiac cath 12/ 2015 ef 30%;     Nocturia more than twice per night    S/P insertion of spinal cord stimulator followed by [facility]---- Dr Pecola Leisure (notes in care everywhere)   10-09-2018  '@[facility]'$ --- thoracic spinal cord stimular/ generator  (device from [facility])---- per pt has a control   Scoliosis    Uses walker 02/18/2021   all the time   Wears glasses    for reading   Past Surgical History:  Procedure Laterality Date   BI-VENTRICULAR IMPLANTABLE CARDIOVERTER DEFIBRILLATOR N/A 05/07/2014   Procedure: BI-VENTRICULAR IMPLANTABLE CARDIOVERTER DEFIBRILLATOR  (CRT-D);  Surgeon: Deboraha Sprang, MD;  Location: [facility] CATH LAB;  Service: Cardiovascular;  Laterality: N/A;   CARDIAC CATHETERIZATION  03-03-2014  dr Kathlyn [place]   [facility]   pLAD 20%, pRCA 20%, dRCA 50%, RPLS 50%;  ef 30%, mild elevated LVEDP, mild gradient across aortic valve LVOT   CARDIOVASCULAR STRESS TEST  11/25/2013   High risk nuclear study w/ large high severity inferior wall perfusion defect on stress and rest images, large mild severity anteroseptal wall perfusion defect on stress and rest images, No inducible ischemia/ global moderate hypokinesis, ef 28%   CATARACT EXTRACTION W/ INTRAOCULAR LENS  IMPLANT, BILATERAL Bilateral right 12-2013 / left  02-2014   colonscopy  11/2020   benign polyps removed   CYSTOSCOPY W/ RETROGRADES Bilateral 01/06/2015   Procedure: CYSTOSCOPY WITH  BLADDER BIOPSY BILATERAL RETROGRADE PYELOGRAM,INSTILLATION OF MITOMYCIN C;  Surgeon: [name], MD;  Location: [facility];  Service: Urology;  Laterality: Bilateral;   CYSTOSCOPY WITH BIOPSY N/A 10/06/2015   Procedure: CYSTO WITH BLADDER BIOPSY, FULGERATION, CHEMO IRRIGATION EPIRUBICIN IN PACU;  Surgeon: [name], MD;  Location: [facility];  Service: Urology;  Laterality: N/A;   CYSTOSCOPY WITH BIOPSY N/A 02/12/2019   Procedure: CYSTOSCOPY WITH BIOPSY/ FULGURATION/ INSTILLATION OF GEMCITABINE, bilateral retrograde turbt greater 5cm;  Surgeon: [name], MD;  Location: [facility];  Service: Urology;  Laterality: N/A;   CYSTOSCOPY WITH BIOPSY N/A 03/19/2021   Procedure: CYSTOSCOPY WITH TRANSURETHRAL RESECTION OF BLADDER TUMOR  GREATER THAN 5CM WITH POST-OPERATIVE INSTILLATION OF EPIRUBICIN;  Surgeon: [name], MD;  Location: [facility];  Service: Urology;  Laterality: N/A;   CYSTOSCOPY WITH FULGERATION N/A 06/30/2017   Procedure: Marland Kitchen CYSTOSCOPY WITH [place] FULGERATION/ BLADDER BIOPSY/ INSTILLATION OF EPIRUBICIN;  Surgeon: [name], MD;  Location: [facility];  Service: Urology;   Laterality: N/A;   CYSTOSCOPY WITH RETROGRADE PYELOGRAM, URETEROSCOPY AND STENT PLACEMENT Bilateral 04/18/2014   Procedure: CYSTOSCOPY WITH RETROGRADE PYELOGRAM;  Surgeon: [name], MD;  Location: [facility];  Service: Urology;  Laterality: Bilateral;   ESOPHAGOGASTRODUODENOSCOPY N/A 11/28/2013   Procedure: ESOPHAGOGASTRODUODENOSCOPY (EGD);  Surgeon: Arta Silence, MD;  Location: [facility] ENDOSCOPY;  Service: Endoscopy;  Laterality: N/A;   LUMBAR DISC SURGERY  1980's   "ruptured disc"   SPINAL CORD STIMULATOR IMPLANT  10-09-2018   '@[facility]'$    Thoracic spinal cord stimular/ genertor  (left flank)----- (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[employer])   TRANSTHORACIC ECHOCARDIOGRAM  12/31/2015   dr Caryl Comes   ef 27-035, grade 1 diastolic dysfunction/ mild MR/ septal motion showed abnormal function and dyssynergy   TRANSURETHRAL RESECTION OF BLADDER  1995   TRANSURETHRAL RESECTION OF BLADDER TUMOR N/A 12/13/2013   Procedure: TRANSURETHRAL RESECTION OF BLADDER TUMOR (TURBT);  Surgeon: [name], MD;  Location: [facility];  Service: Urology;  Laterality: N/A;   TRANSURETHRAL RESECTION OF BLADDER TUMOR N/A 01/17/2014   Procedure: TRANSURETHRAL RESECTION OF BLADDER TUMOR (TURBT);  Surgeon: [name], MD;  Location: [facility];  Service: Urology;  Laterality: N/A;   TRANSURETHRAL RESECTION OF BLADDER TUMOR N/A 04/18/2014   Procedure: TRANSURETHRAL RESECTION OF BLADDER TUMOR (TURBT), CYSTOGRAM;  Surgeon: [name], MD;  Location: [facility];  Service: Urology;  Laterality: N/A;   TRANSURETHRAL RESECTION OF BLADDER TUMOR N/A 12/13/2019   Procedure: TRANSURETHRAL RESECTION OF BLADDER TUMOR (TURBT) GREATER THAN 5CM WITH CYSTOSCOPY/ POST OPERATIVE INSTILLATION OF GEMCITABINE;  Surgeon: [name], MD;  Location: [place];  Service: Urology;  Laterality: N/A;   TRANSURETHRAL RESECTION OF BLADDER TUMOR N/A 10/06/2020   Procedure: TRANSURETHRAL RESECTION OF BLADDER TUMOR (TURBT)/ CYSTOSCOPY/  BLADDER BIOPSY;   Surgeon: [name], MD;  Location: [facility];  Service: Urology;  Laterality: N/A;   Family History  Problem Relation Age of Onset   Cancer Mother    Cancer Brother    Social History   Socioeconomic History   Marital status: Widowed    Spouse name: Not on file   Number of children: Not on file   Years of education: Not on file   Highest education level: Not on file  Occupational History   Not on file  Tobacco Use   Smoking status: Every Day    Packs/day: 0.25    Years: 56.00    Pack years: 14.00    Types: Cigarettes   Smokeless tobacco: Never   Tobacco  comments:    02-11-2019  per pt currently smokes 10 cig's per day , started smoking age 73  Vaping Use   Vaping Use: Never used  Substance and Sexual Activity   Alcohol use: No   Drug use: No   Sexual activity: Never    Birth control/protection: Post-menopausal  Other Topics Concern   Not on file  Social History Narrative   Not on file   Social Determinants of Health   Financial Resource Strain: Low Risk    Difficulty of Paying Living Expenses: Not hard at all  Food Insecurity: No Food Insecurity   Worried About Charity fundraiser in the Last Year: Never true   [place] in the Last Year: Never true  Transportation Needs: No Transportation Needs   Lack of Transportation (Medical): No   Lack of Transportation (Non-Medical): No  Physical Activity: Not on file  Stress: No Stress Concern Present   Feeling of Stress : Not at all  Social Connections: Unknown   Frequency of Communication with Friends and Family: More than three times a week   Frequency of Social Gatherings with Friends and Family: More than three times a week   Attends Religious Services: Not on file   Active Member of Clubs or Organizations: Not on file   Attends Archivist Meetings: Not on file   Marital Status: Not on file    Tobacco Counseling Ready to quit: Not Answered Counseling given: Not Answered Tobacco comments:  02-11-2019  per pt currently smokes 10 cig's per day , started smoking age 41   Clinical Intake:  Pre-visit preparation completed: Yes           How often do you need to have someone help you when you read instructions, pamphlets, or other written materials from your doctor or pharmacy?: 1 - Never     Activities of Daily Living    08/11/2021    3:07 PM 03/05/2021    2:07 PM  In your present state of health, do you have any difficulty performing the following activities:  Hearing? 0   Vision? 0   Difficulty concentrating or making decisions? 0   Walking or climbing stairs? 1   Comment Walker in use when ambulating   Dressing or bathing? 0   Doing errands, shopping? 0 0  Preparing Food and eating ? Y   Using the Toilet? N   In the past six months, have you accidently leaked urine? Y   Comment Managed with daily brief   Do you have problems with loss of bowel control? N   Managing your Medications? N   Managing your Finances? N   Housekeeping or managing your Housekeeping? Y     Patient Care Team: [name], MD as PCP - General (Internal Medicine) Deboraha Sprang, MD as PCP - Electrophysiology (Cardiology) Briscoe Deutscher, MD (Family Medicine)  Indicate any recent Medical Services you may have received from other than [facility] providers in the past year (date may be approximate).     Assessment:   This is a routine wellness examination for [place].  Virtual Visit via Telephone Note  I connected with  [name] on 08/11/21 at  3:00 PM EDT by telephone and verified that I am speaking with the correct person using two identifiers.  Persons participating in the virtual visit: patient/Nurse Health Advisor   I discussed the limitations of performing an evaluation and management service by telehealth. We continued and completed  visit with audio only. Some vital signs may be absent or patient reported.   Hearing/Vision screen Hearing Screening - Comments:: Patient  is able to hear conversational tones without difficulty.  No issues reported. Vision Screening - Comments:: Followed by [facility]  Wears corrective lenses when reading Cataract extraction, bilateral   Dietary issues and exercise activities discussed:   Regular diet   Goals Addressed               This Visit's Progress     Patient Stated     Healthy Lifestyle (pt-stated)        Stay active Healthy diet Good water intake       Depression Screen    08/11/2021    3:30 PM 07/07/2021    1:15 PM 07/02/2021    3:03 PM 02/04/2021   12:44 PM 11/10/2020    2:05 PM 08/11/2020   10:54 AM 08/06/2020    1:26 PM  PHQ 2/9 Scores  PHQ - 2 Score 0 0 1 0 0 0 0  PHQ- 9 Score 0 0         Fall Risk    08/11/2021    3:10 PM 07/07/2021    1:16 PM 07/02/2021    3:03 PM 06/30/2021    1:00 PM 05/06/2021    1:48 PM  [place] in the past year? 0 0 0 0 0  Number falls in past yr:  0 0  0  Injury with Fall?  0 0    Risk for fall due to : Impaired balance/gait No Fall Risks No Fall Risks  No Fall Risks  Risk for fall due to: Comment Walker in use      Follow up Falls evaluation completed Falls evaluation completed Falls evaluation completed  Falls evaluation completed    [place]: Home free of loose throw rugs in walkways, pet beds, electrical cords, etc? Yes  Adequate lighting in your home to reduce risk of falls? Yes   ASSISTIVE DEVICES UTILIZED TO PREVENT FALLS: Life alert? Yes  Use of a cane, walker or w/c? Yes  Grab bars in the bathroom? Yes  Shower chair or bench in shower? Yes  Elevated toilet seat or a handicapped toilet? Yes   TIMED UP AND GO: Was the test performed? No .   Cognitive Function:  Patient is alert and oriented x3.       07/15/2019   11:24 AM 07/09/2018    2:23 PM 06/09/2017    1:56 PM 05/13/2016    4:55 PM  6CIT Screen  What Year? 0 points 0 points 0 points 0 points  What month? 0 points 0 points 0 points  0 points  What time? 0 points 0 points 0 points 0 points  Count back from 20  0 points 0 points 0 points  Months in reverse 0 points 0 points 0 points 0 points  Repeat phrase 0 points 0 points 0 points 0 points  Total Score  0 points 0 points 0 points    Immunizations Immunization History  Administered Date(s) Administered   Fluad Quad(high Dose 65+) 12/10/2018, 01/13/2020, 02/02/2021   Influenza, High Dose Seasonal PF 01/29/2018   Influenza,inj,Quad PF,6+ Mos 12/27/2013   Influenza-Unspecified 12/24/2015, 02/17/2017   Moderna Sars-Covid-2 Vaccination 10/21/2019, 11/18/2019   Pneumococcal Conjugate-13 12/27/2013   Pneumococcal Polysaccharide-23 06/07/2018   Zoster Recombinat (Shingrix) 04/28/2021, 07/07/2021   TDAP status: Due, Education has been provided regarding the  importance of this vaccine. Advised may receive this vaccine at local pharmacy or Health Dept. Aware to provide a copy of the vaccination record if obtained from local pharmacy or Health Dept. Verbalized acceptance and understanding. Deferred.   Screening Tests Health Maintenance  Topic Date Due   COVID-19 Vaccine (3 - Moderna risk series) 08/27/2021 (Originally 12/16/2019)   TETANUS/TDAP  08/12/2022 (Originally 03/21/1959)   INFLUENZA VACCINE  10/19/2021   Pneumonia Vaccine 70+ Years old  Completed   DEXA SCAN  Completed   Zoster Vaccines- Shingrix  Completed   HPV VACCINES  Aged Out   Health Maintenance There are no preventive care reminders to display for this patient.  Lung Cancer Screening: (Low Dose CT Chest recommended if Age 19-80 years, 30 pack-year currently smoking OR have quit w/in 15years.) does not qualify.   Hepatitis C Screening: does not qualify.  Vision Screening: Recommended annual ophthalmology exams for early detection of glaucoma and other disorders of the eye.  Dental Screening: Recommended annual dental exams for proper oral hygiene  Community Resource Referral / Chronic Care  Management: CRR required this visit?  No   CCM required this visit?  No      Plan:   Keep all routine maintenance appointments.   I have personally reviewed and noted the following in the patient's chart:   Medical and social history Use of alcohol, tobacco or illicit drugs  Current medications and supplements including opioid prescriptions. Taking Oxycodone, followed by Dr. Holley Raring.  Functional ability and status Nutritional status Physical activity Advanced directives List of other physicians Hospitalizations, surgeries, and ER visits in previous 12 months Vitals Screenings to include cognitive, depression, and falls Referrals and appointments  In addition, I have reviewed and discussed with patient certain preventive protocols, quality metrics, and best practice recommendations. A written personalized care plan for preventive services as well as general preventive health recommendations were provided to patient.     Varney Biles, LPN   03/21/7508

## 2021-08-11 NOTE — Patient Instructions (Addendum)
[name] , Thank you for taking time to come for your Medicare Wellness Visit. I appreciate your ongoing commitment to your health goals. Please review the following plan we discussed and let me know if I can assist you in the future.   These are the goals we discussed:  Goals       Patient Stated     Healthy Lifestyle (pt-stated)      Stay active Healthy diet Good water intake        This is a list of the screening recommended for you and due dates:  Health Maintenance  Topic Date Due   COVID-19 Vaccine (3 - Moderna risk series) 08/27/2021*   Tetanus Vaccine  08/12/2022*   Flu Shot  10/19/2021   Pneumonia Vaccine  Completed   DEXA scan (bone density measurement)  Completed   Zoster (Shingles) Vaccine  Completed   HPV Vaccine  Aged Out  *Topic was postponed. The date shown is not the original due date.    Opioid Pain Medicine Management Opioids are powerful medicines that are used to treat moderate to severe pain. When used for short periods of time, they can help you to: Sleep better. Do better in physical or occupational therapy. Feel better in the first few days after an injury. Recover from surgery. Opioids should be taken with the supervision of a trained health care provider. They should be taken for the shortest period of time possible. This is because opioids can be addictive, and the longer you take opioids, the greater your risk of addiction. This addiction can also be called opioid use disorder. What are the risks? Using opioid pain medicines for longer than 3 days increases your risk of side effects. Side effects include: Constipation. Nausea and vomiting. Breathing difficulties (respiratory depression). Drowsiness. Confusion. Opioid use disorder. Itching. Taking opioid pain medicine for a long period of time can affect your ability to do daily tasks. It also puts you at risk for: Motor vehicle crashes. Depression. Suicide. Heart attack. Overdose, which  can be life-threatening. What is a pain treatment plan? A pain treatment plan is an agreement between you and your health care provider. Pain is unique to each person, and treatments vary depending on your condition. To manage your pain, you and your health care provider need to work together. To help you do this: Discuss the goals of your treatment, including how much pain you might expect to have and how you will manage the pain. Review the risks and benefits of taking opioid medicines. Remember that a good treatment plan uses more than one approach and minimizes the chance of side effects. Be honest about the amount of medicines you take and about any drug or alcohol use. Get pain medicine prescriptions from only one health care provider. Pain can be managed with many types of alternative treatments. Ask your health care provider to refer you to one or more specialists who can help you manage pain through: Physical or occupational therapy. Counseling (cognitive behavioral therapy). Good nutrition. Biofeedback. Massage. Meditation. Non-opioid medicine. Following a gentle exercise program. How to use opioid pain medicine Taking medicine Take your pain medicine exactly as told by your health care provider. Take it only when you need it. If your pain gets less severe, you may take less than your prescribed dose if your health care provider approves. If you are not having pain, do nottake pain medicine unless your health care provider tells you to take it. If your pain is severe,  do nottry to treat it yourself by taking more pills than instructed on your prescription. Contact your health care provider for help. Write down the times when you take your pain medicine. It is easy to become confused while on pain medicine. Writing the time can help you avoid overdose. Take other over-the-counter or prescription medicines only as told by your health care provider. Keeping yourself and others  safe  While you are taking opioid pain medicine: Do not drive, use machinery, or power tools. Do not sign legal documents. Do not drink alcohol. Do not take sleeping pills. Do not supervise children by yourself. Do not do activities that require climbing or being in high places. Do not go to a lake, river, ocean, spa, or swimming pool. Do not share your pain medicine with anyone. Keep pain medicine in a locked cabinet or in a secure area where pets and children cannot reach it. Stopping your use of opioids If you have been taking opioid medicine for more than a few weeks, you may need to slowly decrease (taper) how much you take until you stop completely. Tapering your use of opioids can decrease your risk of symptoms of withdrawal, such as: Pain and cramping in the abdomen. Nausea. Sweating. Sleepiness. Restlessness. Uncontrollable shaking (tremors). Cravings for the medicine. Do not attempt to taper your use of opioids on your own. Talk with your health care provider about how to do this. Your health care provider may prescribe a step-down schedule based on how much medicine you are taking and how long you have been taking it. Getting rid of leftover pills Do not save any leftover pills. Get rid of leftover pills safely by: Taking the medicine to a prescription take-back program. This is usually offered by the county or law enforcement. Bringing them to a pharmacy that has a drug disposal container. Flushing them down the toilet. Check the label or package insert of your medicine to see whether this is safe to do. Throwing them out in the trash. Check the label or package insert of your medicine to see whether this is safe to do. If it is safe to throw it out, remove the medicine from the original container, put it into a sealable bag or container, and mix it with used coffee grounds, food scraps, dirt, or cat litter before putting it in the trash. Follow these instructions at  home: Activity Do exercises as told by your health care provider. Avoid activities that make your pain worse. Return to your normal activities as told by your health care provider. Ask your health care provider what activities are safe for you. General instructions You may need to take these actions to prevent or treat constipation: Drink enough fluid to keep your urine pale yellow. Take over-the-counter or prescription medicines. Eat foods that are high in fiber, such as beans, whole grains, and fresh fruits and vegetables. Limit foods that are high in fat and processed sugars, such as fried or sweet foods. Keep all follow-up visits. This is important. Where to find support If you have been taking opioids for a long time, you may benefit from receiving support for quitting from a local support group or counselor. Ask your health care provider for a referral to these resources in your area. Where to find more information Centers for Disease Control and Prevention (CDC): http://www.wolf.info/ U.[place]. Food and Drug Administration (FDA): [place].ch Get help right away if: You may have taken too much of an opioid (overdosed). Common symptoms of an  overdose: Your breathing is slower or more shallow than normal. You have a very slow heartbeat (pulse). You have slurred speech. You have nausea and vomiting. Your pupils become very small. You have other potential symptoms: You are very confused. You faint or feel like you will faint. You have cold, clammy skin. You have blue lips or fingernails. You have thoughts of harming yourself or harming others. These symptoms may represent a serious problem that is an emergency. Do not wait to see if the symptoms will go away. Get medical help right away. Call your local emergency services (911 in the [place] Do not drive yourself to the hospital.  If you ever feel like you may hurt yourself or others, or have thoughts about taking your own life, get help right away.  Go to your nearest emergency department or: Call your local emergency services (911 in the [place] Call the [facility] 6784901803 in the [place] Call a suicide crisis helpline, such as the [facility] at 662-305-9973 or 988 in the [place]. This is open 24 hours a day in the [place] Text the Crisis Text Line at 9715304823 (in the [place].). Summary Opioid medicines can help you manage moderate to severe pain for a short period of time. A pain treatment plan is an agreement between you and your health care provider. Discuss the goals of your treatment, including how much pain you might expect to have and how you will manage the pain. If you think that you or someone else may have taken too much of an opioid, get medical help right away. This information is not intended to replace advice given to you by your health care provider. Make sure you discuss any questions you have with your health care provider. Document Revised: 09/30/2020 Document Reviewed: 06/17/2020 Elsevier Patient Education  [place].

## 2021-08-12 ENCOUNTER — Telehealth: Payer: Self-pay | Admitting: Student in an Organized Health Care Education/Training Program

## 2021-08-12 NOTE — Telephone Encounter (Signed)
Please call patient regarding medication. She just came off drug holiday and is worried she wont have enough to last until time to refill again ? Not sure what she is talking about. Her next appt is 11-03-21. Please call patient

## 2021-08-18 ENCOUNTER — Encounter: Payer: Self-pay | Admitting: Student in an Organized Health Care Education/Training Program

## 2021-08-18 ENCOUNTER — Ambulatory Visit
Payer: Medicare Other | Attending: Student in an Organized Health Care Education/Training Program | Admitting: Student in an Organized Health Care Education/Training Program

## 2021-08-18 DIAGNOSIS — G894 Chronic pain syndrome: Secondary | ICD-10-CM | POA: Diagnosis not present

## 2021-08-18 DIAGNOSIS — M47816 Spondylosis without myelopathy or radiculopathy, lumbar region: Secondary | ICD-10-CM | POA: Diagnosis not present

## 2021-08-18 DIAGNOSIS — M5416 Radiculopathy, lumbar region: Secondary | ICD-10-CM

## 2021-08-18 MED ORDER — OXYCODONE-ACETAMINOPHEN 10-325 MG PO TABS: 1.0000 | ORAL_TABLET | ORAL | 0 refills | Status: AC | PRN

## 2021-08-18 NOTE — Progress Notes (Signed)
Patient: [name]  Service Category: E/M  Provider: [name], MD  DOB: [birth year]  DOS: 08/18/2021  Location: Office  MRN: [id number]  Setting: Ambulatory outpatient  Referring Provider: [name], MD  Type: Established Patient  Specialty: Interventional Pain Management  PCP: [name], MD  Location: Remote location  Delivery: TeleHealth     Virtual Encounter - Pain Management PROVIDER NOTE: Information contained herein reflects review and annotations entered in association with encounter. Interpretation of such information and data should be left to medically-trained personnel. Information provided to patient can be located elsewhere in the medical record under "Patient Instructions". Document created using STT-dictation technology, any transcriptional errors that may result from process are unintentional.    Contact & Pharmacy Preferred: [phone] Home: [phone] (home) Mobile: [phone] (mobile) E-mail: [id number] [name], [address] [place] [address] Phone: [phone] Fax: [phone]   Pre-screening  Ms. Struble offered "in-person" vs "virtual" encounter. She indicated preferring virtual for this encounter.   Reason COVID-19*  Social distancing based on CDC and AMA recommendations.   I contacted [name] on 08/18/2021 via telephone.      I clearly identified myself as [name], MD. I verified that I was speaking with the correct person using two identifiers (Name: [name], and date of birth: [birth year]).  Consent I sought verbal advanced consent from [name] for virtual visit interactions. I informed Ms. Namba of possible security and privacy concerns, risks, and limitations associated with providing "not-in-person" medical evaluation and management services. I also informed [name] of the availability of "in-person"  appointments. Finally, I informed her that there would be a charge for the virtual visit and that she could be  personally, fully or partially, financially responsible for it. [name] expressed understanding and agreed to proceed.   Historic Elements   [name] is a 81 y.o. year old, female patient evaluated today after our last contact on 08/12/2021. [name]  has a past medical history of AICD (automatic cardioverter/defibrillator) present ([place] cardiologist--- dr Caryl Comes ), Arthritis, Bladder cancer [facility]) (urologist-  dr Junious Silk), Chronic hyponatremia, Chronic low back pain with bilateral sciatica, Chronic systolic (congestive) heart failure ([place]), COPD with emphysema ([place]), Coronary artery disease (cardiologist-  dr Kathlyn [place]), DDD (degenerative disc disease), thoracolumbar, Frequent urination, Full dentures, Gait instability, GERD (gastroesophageal reflux disease), History of iron deficiency anemia (11/2013), History of stomach ulcers (11/2013), Hypertension, LBBB (left bundle branch block), NICM (nonischemic cardiomyopathy) ([place]) (last echo 12-31-2015 ef 55-60%), Nocturia more than twice per night, S/P insertion of spinal cord stimulator (followed by [facility]---- Dr Pecola Leisure (notes in care everywhere)), Scoliosis, Uses walker (02/18/2021), and Wears glasses. She also  has a past surgical history that includes Esophagogastroduodenoscopy (N/A, 11/28/2013); Transurethral resection of bladder tumor (N/A, 12/13/2013); Transurethral resection of bladder tumor (N/A, 01/17/2014); Cataract extraction w/ intraocular lens  implant, bilateral (Bilateral, right 12-2013 / left  02-2014); Cystoscopy with retrograde pyelogram, ureteroscopy and stent placement (Bilateral, 04/18/2014); Transurethral resection of bladder tumor (N/A, 04/18/2014); Lumbar disc surgery (1980's); bi-ventricular implantable cardioverter defibrillator (N/A, 05/07/2014); Cystoscopy w/ retrogrades (Bilateral, 01/06/2015); Cystoscopy  with biopsy (N/A, 10/06/2015); Cardiac catheterization (03-03-2014  dr Kathlyn [place]   [facility]); Transurethral resection of bladder (1995); Cardiovascular stress test (11/25/2013); transthoracic echocardiogram (12/31/2015   dr Caryl Comes); Cystoscopy with fulgeration (N/A, 06/30/2017); Spinal cord stimulator implant (10-09-2018   @  [facility]); Cystoscopy with biopsy (N/A, 02/12/2019); Transurethral resection of bladder tumor (N/A, 12/13/2019); Transurethral resection of bladder tumor (N/A, 10/06/2020); colonscopy (11/2020); and Cystoscopy with biopsy (N/A, 03/19/2021). [name] has a current medication list which includes the following prescription(s): acetaminophen, amitriptyline, amlodipine, dibucaine, gabapentin, losartan, menthol (topical analgesic), metoprolol succinate, [START ON 09/03/2021] oxycodone-acetaminophen, [START ON 11/03/2021] oxycodone-acetaminophen, pantoprazole, polyethylene glycol, promethazine, sucralfate, and [START ON 08/20/2021] oxycodone-acetaminophen, and the following Facility-Administered Medications: epirubicin (ELLENCE) 50 mg in sodium chloride 0.9 % bladder instillation, epirubicin (ELLENCE) 80 mg in sodium chloride (PF) 0.9 % bladder instillation, gemcitabine, and gemcitabine. She  reports that she has been smoking cigarettes. She has a 14.00 pack-year smoking history. She has never used smokeless tobacco. She reports that she does not drink alcohol and does not use drugs. [name] has No Known Allergies.   HPI  Today, she is being contacted for medication management.    Patient states that she was unable to continue with her opioid holiday and has returned back to her previous dosing regimen of 10 mg every 4-6 hours as needed, maximum 5 tablets/day.  She states that with her current intake she will be out of her medications in approximately 2 days.  I will send in a refill for 08/20/2021 to last her until 09/03/2021 when she can pick up her next prescription.  I did commend her for completing a  drug holiday for 1 month.  I believe this did help with her central sensitization and her overall tolerance.    Pharmacotherapy Assessment   Opioid Analgesic: Percocet 10 mg up to 5 times a day as needed for severe breakthrough pain, quantity 150/month; MME equals 75   Monitoring: [place] PMP: PDMP reviewed during this encounter.       Pharmacotherapy: No side-effects or adverse reactions reported. Compliance: No problems identified. Effectiveness: Clinically acceptable. Plan: Refer to "POC". UDS:  Summary  Date Value Ref Range Status  08/11/2020 Note  Final    Comment:    ==================================================================== [employer] Select 13 (MW) ==================================================================== Test                             Result       Flag       Units  Drug Present and Declared for Prescription Verification   Oxycodone                      4370         EXPECTED   ng/mg creat   Oxymorphone                    8753         EXPECTED   ng/mg creat   Noroxycodone                   12500        EXPECTED   ng/mg creat   Noroxymorphone                 2957         EXPECTED   ng/mg creat    Sources of oxycodone are scheduled prescription medications.    Oxymorphone, noroxycodone, and noroxymorphone are expected    metabolites of oxycodone. Oxymorphone is also available as a    scheduled prescription medication.  ==================================================================== Test  Result    Flag   Units      Ref Range   Creatinine              30               mg/dL      >=20 ==================================================================== Declared Medications:  The flagging and interpretation on this report are based on the  following declared medications.  Unexpected results may arise from  inaccuracies in the declared medications.   **Note: The testing scope of this panel includes these medications:   Oxycodone  (Percocet)   **Note: The testing scope of this panel does not include the  following reported medications:   Acetaminophen  Acetaminophen (Percocet)  Amitriptyline  Amlodipine (Norvasc)  Gabapentin  Losartan  Menthol  Metoprolol  Polyethylene Glycol (MiraLAX)  Promethazine  Rosuvastatin  Sucralfate (Carafate) ==================================================================== For clinical consultation, please call [phone]. ====================================================================      Laboratory Chemistry Profile   Renal Lab Results  Component Value Date   BUN 13 03/05/2021   CREATININE 0.53 03/05/2021   BCR 18 04/28/2014   GFR 89.19 08/27/2019   GFRAA >60 06/17/2019   GFRNONAA >60 03/05/2021    Hepatic Lab Results  Component Value Date   AST 33 12/09/2020   ALT 25 12/09/2020   ALBUMIN 4.6 12/09/2020   ALKPHOS 78 12/09/2020   LIPASE 30 11/23/2013    Electrolytes Lab Results  Component Value Date   NA 131 (L) 03/05/2021   K 4.9 03/05/2021   CL 98 03/05/2021   CALCIUM 9.7 03/05/2021   MG 2.3 06/03/2019    Bone Lab Results  Component Value Date   VD25OH 36.17 01/11/2019    Inflammation (CRP: Acute Phase) (ESR: Chronic Phase) No results found for: CRP, ESRSEDRATE, LATICACIDVEN       Note: Above Lab results reviewed.  Imaging  CUP PACEART REMOTE DEVICE CHECK Scheduled remote reviewed. Normal device function.   Battery estimated 6mo 2.72V, RRT 2.73, no trigger Next remote 6/12 Route to triage, RRT per voltage LA  Assessment  The primary encounter diagnosis was Lumbar radiculopathy. Diagnoses of Lumbar facet arthropathy and Chronic pain syndrome were also pertinent to this visit.  Plan of Care    Ms. BSHALINA NORFOLKhas a current medication list which includes the following long-term medication(s): amitriptyline, amlodipine, gabapentin, losartan, metoprolol succinate, promethazine, and sucralfate.  Pharmacotherapy (Medications  Ordered): Meds ordered this encounter  Medications   oxyCODONE-acetaminophen (PERCOCET) 10-325 MG tablet    Sig: Take 1 tablet by mouth every 4 (four) hours as needed for up to 14 days for pain.    Dispense:  70 tablet    Refill:  0     Follow-up plan:   Return for Keep sch. appt in August face-to-face for med management.    Recent Visits Date Type Provider Dept  07/29/21 Office Visit LGillis Santa MD [facility]  06/30/21 Office Visit LGillis Santa MD [facility]  Showing recent visits within past 90 days and meeting all other requirements Today's Visits Date Type Provider Dept  08/18/21 Office Visit LGillis Santa MD [facility]  Showing today's visits and meeting all other requirements Future Appointments No visits were found meeting these conditions. Showing future appointments within next 90 days and meeting all other requirements  I discussed the assessment and treatment plan with the patient. The patient was provided an opportunity to ask questions and all were answered. The patient agreed with the plan and demonstrated  an understanding of the instructions.  Patient advised to call back or seek an in-person evaluation if the symptoms or condition worsens.  Duration of encounter: 30 minutes.  Note by: [name], MD Date: 08/18/2021; Time: 3:34 PM

## 2021-08-30 ENCOUNTER — Ambulatory Visit (INDEPENDENT_AMBULATORY_CARE_PROVIDER_SITE_OTHER): Payer: Medicare Other

## 2021-08-30 DIAGNOSIS — I428 Other cardiomyopathies: Secondary | ICD-10-CM

## 2021-08-31 LAB — CUP PACEART REMOTE DEVICE CHECK
Battery Remaining Longevity: 1 mo
Battery Voltage: 2.67 V
Brady Statistic AP VP Percent: 53.65 %
Brady Statistic AP VS Percent: 0.89 %
Brady Statistic AS VP Percent: 44.4 %
Brady Statistic AS VS Percent: 1.06 %
Brady Statistic RA Percent Paced: 53.24 %
Brady Statistic RV Percent Paced: 95.03 %
Date Time Interrogation Session: 20230612052606
HighPow Impedance: 68 Ohm
Implantable Lead Implant Date: 20160217
Implantable Lead Implant Date: 20160217
Implantable Lead Implant Date: 20160217
Implantable Lead Location: 753858
Implantable Lead Location: 753859
Implantable Lead Location: 753860
Implantable Lead Model: 4298
Implantable Lead Model: 5076
Implantable Pulse Generator Implant Date: 20160217
Lead Channel Impedance Value: 361 Ohm
Lead Channel Impedance Value: 399 Ohm
Lead Channel Impedance Value: 399 Ohm
Lead Channel Impedance Value: 418 Ohm
Lead Channel Impedance Value: 456 Ohm
Lead Channel Impedance Value: 475 Ohm
Lead Channel Impedance Value: 551 Ohm
Lead Channel Impedance Value: 589 Ohm
Lead Channel Impedance Value: 665 Ohm
Lead Channel Impedance Value: 722 Ohm
Lead Channel Impedance Value: 874 Ohm
Lead Channel Impedance Value: 893 Ohm
Lead Channel Impedance Value: 931 Ohm
Lead Channel Pacing Threshold Amplitude: 0.625 V
Lead Channel Pacing Threshold Amplitude: 0.625 V
Lead Channel Pacing Threshold Amplitude: 1.375 V
Lead Channel Pacing Threshold Pulse Width: 0.4 ms
Lead Channel Pacing Threshold Pulse Width: 0.4 ms
Lead Channel Pacing Threshold Pulse Width: 0.4 ms
Lead Channel Sensing Intrinsic Amplitude: 2.125 mV
Lead Channel Sensing Intrinsic Amplitude: 2.125 mV
Lead Channel Sensing Intrinsic Amplitude: 22.375 mV
Lead Channel Sensing Intrinsic Amplitude: 22.375 mV
Lead Channel Setting Pacing Amplitude: 2 V
Lead Channel Setting Pacing Amplitude: 2.5 V
Lead Channel Setting Pacing Amplitude: 2.5 V
Lead Channel Setting Pacing Pulse Width: 0.4 ms
Lead Channel Setting Pacing Pulse Width: 0.4 ms
Lead Channel Setting Sensing Sensitivity: 0.45 mV

## 2021-09-01 DIAGNOSIS — C678 Malignant neoplasm of overlapping sites of bladder: Secondary | ICD-10-CM | POA: Diagnosis not present

## 2021-09-06 ENCOUNTER — Ambulatory Visit: Payer: Medicare Other | Admitting: Internal Medicine

## 2021-09-10 ENCOUNTER — Other Ambulatory Visit: Payer: Self-pay | Admitting: Urology

## 2021-09-16 NOTE — Addendum Note (Signed)
Addended by: Douglass Rivers D on: 09/16/2021 09:28 AM   Modules accepted: Level of Service

## 2021-09-16 NOTE — Progress Notes (Signed)
Remote ICD transmission.   

## 2021-09-23 ENCOUNTER — Telehealth: Payer: Self-pay | Admitting: Student in an Organized Health Care Education/Training Program

## 2021-09-23 ENCOUNTER — Telehealth: Payer: Self-pay | Admitting: Internal Medicine

## 2021-09-23 ENCOUNTER — Other Ambulatory Visit: Payer: Self-pay | Admitting: *Deleted

## 2021-09-23 DIAGNOSIS — M5416 Radiculopathy, lumbar region: Secondary | ICD-10-CM

## 2021-09-23 DIAGNOSIS — G894 Chronic pain syndrome: Secondary | ICD-10-CM

## 2021-09-23 DIAGNOSIS — M47816 Spondylosis without myelopathy or radiculopathy, lumbar region: Secondary | ICD-10-CM

## 2021-09-23 NOTE — Telephone Encounter (Signed)
I called [facility], they told me that the medication has been in short supply, 110 tablets is all that was available at that time. They do not have any in stock now. I called patient, informed her of this, instructed her to call other pharmacies to find availability, and call our office back so that we may send a new script for the remaining tablets.

## 2021-09-23 NOTE — Telephone Encounter (Signed)
Pt called in requesting for Dr. Derrel Nip to prescribe medication (oxyCODONE-acetaminophen (PERCOCET) 10-325 MG tablet)... Advised pt that medication was not prescribe by Dr. Derrel Nip... Advised pt per Dr. Derrel Nip, will not prescribe any medication without seeing pt in office first... Pt requested appt today or tomorrow... Advised pt no avail appt... Pt stated that she needs medication right now because she is in pain... Advised pt if she can call to local pharmacies to see if medication is avail at local pharmacy... Pt stated that she call and no one has the medication.... Pt stated that she had contact her pain management provider to see if he is able to prescribe something else.... Per pt, pain management office stated that provider will not prescribe anything else... Per Dr. Holley Raring note, pt doesn't feel like calling other pharmacies to see if any other pharmacies has medication available... Pt requesting callback

## 2021-09-23 NOTE — Telephone Encounter (Signed)
Rx request sent to Dr. Lateef 

## 2021-09-23 NOTE — Telephone Encounter (Signed)
Pt stated that when she picked up her meds in June 16th they only gave her an 110 pills. Pt stated that she was short 40 pills. Patient stated that she needs her meds. Pt don't understanf why the pharmacy only gave her 110 pils. Patient takes 5 pills a day. Please give patient a call. Thanks

## 2021-09-23 NOTE — Telephone Encounter (Signed)
Patient asking if Dr. Holley Raring will prescribe something else, as she doesn't feel like calling other pharmacies. Dr. Holley Raring asked, he will not prescribe another medication. Patient notified.

## 2021-09-23 NOTE — Telephone Encounter (Signed)
Patient called back and says [name] has her medications, can a script be sent there ?

## 2021-09-24 ENCOUNTER — Other Ambulatory Visit: Payer: Self-pay

## 2021-09-24 NOTE — Telephone Encounter (Signed)
I called patient & I had already looked in chart at messages between Dr. Elwyn Lade office vs ours. I can see where he had not sent the oxycodone to [facility] today. I also did not see this message late yesterday. Pt is very concerned she runs out of pain medication Sunday. Her usual pharmacy does not have medication. She was asking that someone either Dr. Derrel Nip or preferably Dr. Holley Raring send to [facility] where medication is in stock. Are you willing to send for weekend since Dr. Elwyn Lade office closed at 12p. Pt is worried. She is aware that we normally DO NOT fill here & that Dr. Derrel Nip out of the office. I advised all I could do was send to covering physician.

## 2021-09-24 NOTE — Telephone Encounter (Signed)
I have advised patient of this & she stated that she did not realize info below. She asked that we let her know if you do hear back from Dr. Holley Raring.

## 2021-09-24 NOTE — Telephone Encounter (Signed)
Noted. I will also forward this note to Dr Holley Raring so he can refill as appropriate.

## 2021-09-24 NOTE — Telephone Encounter (Signed)
Given that she follows with pain management for this medication I can not refill it as it could violate any pain medication contract she has with them and may jeopardize her ability to continue to get this medication from her pain specialist. I sent a secure message to Dr Holley Raring to see if he can refill it for the patient.

## 2021-09-25 ENCOUNTER — Other Ambulatory Visit: Payer: Self-pay | Admitting: Student in an Organized Health Care Education/Training Program

## 2021-09-25 DIAGNOSIS — M5416 Radiculopathy, lumbar region: Secondary | ICD-10-CM

## 2021-09-25 DIAGNOSIS — M47816 Spondylosis without myelopathy or radiculopathy, lumbar region: Secondary | ICD-10-CM

## 2021-09-25 DIAGNOSIS — G894 Chronic pain syndrome: Secondary | ICD-10-CM

## 2021-09-27 ENCOUNTER — Telehealth: Payer: Self-pay | Admitting: Student in an Organized Health Care Education/Training Program

## 2021-09-27 NOTE — Telephone Encounter (Signed)
I have sent prescription request to Dr. Holley Raring, with information he requested.

## 2021-09-27 NOTE — Telephone Encounter (Signed)
Patient stated that she has been out of meds since 09-25-21. Patient wanted to know why her meds hadn't been called yet. Please give patient a call. Thanks

## 2021-09-28 ENCOUNTER — Other Ambulatory Visit: Payer: Self-pay | Admitting: Student in an Organized Health Care Education/Training Program

## 2021-09-28 DIAGNOSIS — G894 Chronic pain syndrome: Secondary | ICD-10-CM

## 2021-09-28 DIAGNOSIS — M5416 Radiculopathy, lumbar region: Secondary | ICD-10-CM

## 2021-09-28 DIAGNOSIS — M47816 Spondylosis without myelopathy or radiculopathy, lumbar region: Secondary | ICD-10-CM

## 2021-09-28 MED ORDER — OXYCODONE-ACETAMINOPHEN 10-325 MG PO TABS: 1.0000 | ORAL_TABLET | ORAL | 0 refills | Status: AC | PRN

## 2021-09-28 MED ORDER — OXYCODONE-ACETAMINOPHEN 10-325 MG PO TABS: 1.0000 | ORAL_TABLET | ORAL | 0 refills | Status: DC | PRN

## 2021-09-28 NOTE — Progress Notes (Signed)
Patient did a partial fill of her prescriptions.  She was out of her medications and her pharmacy did not have them in stock.  I was also out of town. I have sent her prescriptions to [facility] as below.  She needs to keep her follow-up as scheduled for end of August.  PMP checked and reviewed.    Requested Prescriptions   Signed Prescriptions Disp Refills   oxyCODONE-acetaminophen (PERCOCET) 10-325 MG tablet 150 tablet 0    Sig: Take 1 tablet by mouth every 4 (four) hours as needed for pain.

## 2021-09-30 ENCOUNTER — Ambulatory Visit (INDEPENDENT_AMBULATORY_CARE_PROVIDER_SITE_OTHER): Payer: Medicare Other

## 2021-09-30 DIAGNOSIS — I428 Other cardiomyopathies: Secondary | ICD-10-CM

## 2021-09-30 LAB — CUP PACEART REMOTE DEVICE CHECK
Battery Remaining Longevity: 1 mo — CL
Battery Voltage: 2.66 V
Brady Statistic AP VP Percent: 61.68 %
Brady Statistic AP VS Percent: 0.88 %
Brady Statistic AS VP Percent: 36.4 %
Brady Statistic AS VS Percent: 1.04 %
Brady Statistic RA Percent Paced: 60.72 %
Brady Statistic RV Percent Paced: 94.78 %
Date Time Interrogation Session: 20230713022505
HighPow Impedance: 70 Ohm
Implantable Lead Implant Date: 20160217
Implantable Lead Implant Date: 20160217
Implantable Lead Implant Date: 20160217
Implantable Lead Location: 753858
Implantable Lead Location: 753859
Implantable Lead Location: 753860
Implantable Lead Model: 4298
Implantable Lead Model: 5076
Implantable Pulse Generator Implant Date: 20160217
Lead Channel Impedance Value: 1026 Ohm
Lead Channel Impedance Value: 1064 Ohm
Lead Channel Impedance Value: 399 Ohm
Lead Channel Impedance Value: 456 Ohm
Lead Channel Impedance Value: 456 Ohm
Lead Channel Impedance Value: 532 Ohm
Lead Channel Impedance Value: 532 Ohm
Lead Channel Impedance Value: 551 Ohm
Lead Channel Impedance Value: 646 Ohm
Lead Channel Impedance Value: 703 Ohm
Lead Channel Impedance Value: 893 Ohm
Lead Channel Impedance Value: 893 Ohm
Lead Channel Impedance Value: 988 Ohm
Lead Channel Pacing Threshold Amplitude: 0.625 V
Lead Channel Pacing Threshold Amplitude: 0.625 V
Lead Channel Pacing Threshold Amplitude: 1.375 V
Lead Channel Pacing Threshold Pulse Width: 0.4 ms
Lead Channel Pacing Threshold Pulse Width: 0.4 ms
Lead Channel Pacing Threshold Pulse Width: 0.4 ms
Lead Channel Sensing Intrinsic Amplitude: 23 mV
Lead Channel Sensing Intrinsic Amplitude: 23 mV
Lead Channel Sensing Intrinsic Amplitude: 3.375 mV
Lead Channel Sensing Intrinsic Amplitude: 3.375 mV
Lead Channel Setting Pacing Amplitude: 2 V
Lead Channel Setting Pacing Amplitude: 2.5 V
Lead Channel Setting Pacing Amplitude: 2.5 V
Lead Channel Setting Pacing Pulse Width: 0.4 ms
Lead Channel Setting Pacing Pulse Width: 0.4 ms
Lead Channel Setting Sensing Sensitivity: 0.45 mV

## 2021-10-11 ENCOUNTER — Telehealth: Payer: Self-pay

## 2021-10-11 NOTE — Telephone Encounter (Signed)
Follow up with patient regarding device at [facility] since 08/02/21 [facility] last note patient to consider replace of ICD vs downgrade to pacemaker, patient agreeable to appointment on 09/14/21 at 0900AM in [place] to re-discuss as patient doesn't remember what was discussed at 5/23 apt.

## 2021-10-13 ENCOUNTER — Other Ambulatory Visit: Payer: Self-pay | Admitting: Internal Medicine

## 2021-10-13 NOTE — H&P (View-Only) (Signed)
Electrophysiology Office Note   Date:  10/14/2021   ID:  KEYANNA SANDEFER, DOB 1940-06-08, MRN [id number]  PCP:  [name], MD  Cardiologist:  MA Primary Electrophysiologist:  Virl Axe, MD    No chief complaint on file.    History of Present Illness: [name] is a 81 y.o. female seen in followup  for CRT-D implanted 2/16; device at [facility] and to discuss gen replacement and high voltage v low voltage   She has a history of a nonischemic cardiomyopathy with ejection fraction 25% and left bundle branch block.  She also had tracings in the hospital with the operating QRS in lead V1 suggesting alternating bundle branch block; however those times, the limb leads still demonstrated left bundle branch block  The patient denies chest pain, shortness of breath, nocturnal dyspnea, orthopnea or peripheral edema.  There have been no palpitations, lightheadedness or syncope.    Walks with walker and is back again to review implant options   DATE TEST EF   9/15 Myoview 28%   1/16 Cath   % CA min disease  10/17 Echo   55-65 %   6/20 Echo  60-65%     Date Cr K Hgb  7/17 0.55 4.1    1/19 0.73 4.9    3/20 0.8 4.1 12.5  11/20 0.5 4.2 14.3  12/22 0.53 4.9 13.5    At [facility] implantation of a neurostimulator for back pain assoc with new pains ( [facility] note 03/08/19) but back pain is better; she remains on narcotics with inadequate relief.    Past Medical History:  Diagnosis Date   AICD (automatic cardioverter/defibrillator) present EP cardiologist--- dr Caryl Comes    placement 05-07-2014 , ef 25%,  NICM/  (02-11-2019 last echo 08-31-2018 ef 60-65%)   Arthritis    "in about all my joints; for sure in my back"   Bladder cancer [facility]) urologist-  dr Junious Silk   dx 1995--  recurrent bladder cancer 2015 , s/p TURBT's and chemo instillation's ;   04/ 2019  s/p TURBT   Chronic hyponatremia    Chronic low back pain with bilateral sciatica    s/p  spinal cord stimulator @ [facility]  10-09-2018   Chronic  systolic (congestive) heart failure [facility])    cardiologist-  dr Rogue Jury   COPD with emphysema ([place])    (02-11-2019  per pt has never been on oxygen)   Coronary artery disease cardiologist-  dr Kathlyn [place]   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% per cardiac cath 03-03-2014   DDD (degenerative disc disease), thoracolumbar    Frequent urination    Full dentures    Gait instability    due to chronic low back pain, uses roller walker   GERD (gastroesophageal reflux disease)    History of iron deficiency anemia 11/2013   resolved w/ IV Iron  (02-11-2019  per pt has not had any issues since 2015)   History of stomach ulcers 11/2013   Hypertension    LBBB (left bundle branch block)    NICM (nonischemic cardiomyopathy) ([place]) last echo 12-31-2015 ef 55-60%   dx 09/ 2015 per echo 20%;  myoview 09/ 2015 ef 28%;  per cardiac cath 12/ 2015 ef 30%;     Nocturia more than twice per night    S/P insertion of spinal cord stimulator followed by [facility]---- Dr Pecola Leisure (notes in care everywhere)   10-09-2018  '@[facility]'$ --- thoracic spinal cord stimular/ generator  (device from [facility])----  per pt has a control   Scoliosis    Uses walker 02/18/2021   all the time   Wears glasses    for reading   Past Surgical History:  Procedure Laterality Date   BI-VENTRICULAR IMPLANTABLE CARDIOVERTER DEFIBRILLATOR N/A 05/07/2014   Procedure: BI-VENTRICULAR IMPLANTABLE CARDIOVERTER DEFIBRILLATOR  (CRT-D);  Surgeon: Deboraha Sprang, MD;  Location: [facility] CATH LAB;  Service: Cardiovascular;  Laterality: N/A;   CARDIAC CATHETERIZATION  03-03-2014  dr Kathlyn [place]   [facility]   pLAD 20%, pRCA 20%, dRCA 50%, RPLS 50%;  ef 30%, mild elevated LVEDP, mild gradient across aortic valve LVOT   CARDIOVASCULAR STRESS TEST  11/25/2013   High risk nuclear study w/ large high severity inferior wall perfusion defect on stress and rest images, large mild severity anteroseptal wall perfusion defect on stress and rest images, No inducible ischemia/  global moderate hypokinesis, ef 28%   CATARACT EXTRACTION W/ INTRAOCULAR LENS  IMPLANT, BILATERAL Bilateral right 12-2013 / left  02-2014   colonscopy  11/2020   benign polyps removed   CYSTOSCOPY W/ RETROGRADES Bilateral 01/06/2015   Procedure: CYSTOSCOPY WITH  BLADDER BIOPSY BILATERAL RETROGRADE PYELOGRAM,INSTILLATION OF MITOMYCIN C;  Surgeon: [name], MD;  [facility];  Service: Urology;  Laterality: Bilateral;   CYSTOSCOPY WITH BIOPSY N/A 10/06/2015   Procedure: CYSTO WITH BLADDER BIOPSY, FULGERATION, CHEMO IRRIGATION EPIRUBICIN IN PACU;  Surgeon: [name], MD;  [facility];  Service: Urology;  Laterality: N/A;   CYSTOSCOPY WITH BIOPSY N/A 02/12/2019   Procedure: CYSTOSCOPY WITH BIOPSY/ FULGURATION/ INSTILLATION OF GEMCITABINE, bilateral retrograde turbt greater 5cm;  Surgeon: [name], MD;  Location: [facility];  Service: Urology;  Laterality: N/A;   CYSTOSCOPY WITH BIOPSY N/A 03/19/2021   Procedure: CYSTOSCOPY WITH TRANSURETHRAL RESECTION OF BLADDER TUMOR  GREATER THAN 5CM WITH POST-OPERATIVE INSTILLATION OF EPIRUBICIN;  Surgeon: [name], MD;  [facility];  Service: Urology;  Laterality: N/A;   CYSTOSCOPY WITH FULGERATION N/A 06/30/2017   Procedure: Marland Kitchen CYSTOSCOPY WITH [place] FULGERATION/ BLADDER BIOPSY/ INSTILLATION OF EPIRUBICIN;  Surgeon: [name], MD;  Location: [facility];  Service: Urology;  Laterality: N/A;   CYSTOSCOPY WITH RETROGRADE PYELOGRAM, URETEROSCOPY AND STENT PLACEMENT Bilateral 04/18/2014   Procedure: CYSTOSCOPY WITH RETROGRADE PYELOGRAM;  Surgeon: [name], MD;  [facility];  Service: Urology;  Laterality: Bilateral;   ESOPHAGOGASTRODUODENOSCOPY N/A 11/28/2013   Procedure: ESOPHAGOGASTRODUODENOSCOPY (EGD);  Surgeon: Arta Silence, MD;  Location: [facility] ENDOSCOPY;  Service: Endoscopy;  Laterality: N/A;   LUMBAR DISC SURGERY  1980's   "ruptured disc"   SPINAL CORD  STIMULATOR IMPLANT  10-09-2018   '@[facility]'$    Thoracic spinal cord stimular/ genertor  (left flank)----- (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[employer])   TRANSTHORACIC ECHOCARDIOGRAM  12/31/2015   dr Caryl Comes   ef 30-865, grade 1 diastolic dysfunction/ mild MR/ septal motion showed abnormal function and dyssynergy   TRANSURETHRAL RESECTION OF BLADDER  1995   TRANSURETHRAL RESECTION OF BLADDER TUMOR N/A 12/13/2013   Procedure: TRANSURETHRAL RESECTION OF BLADDER TUMOR (TURBT);  Surgeon: [name], MD;  [facility];  Service: Urology;  Laterality: N/A;   TRANSURETHRAL RESECTION OF BLADDER TUMOR N/A 01/17/2014   Procedure: TRANSURETHRAL RESECTION OF BLADDER TUMOR (TURBT);  Surgeon: [name], MD;  [facility];  Service: Urology;  Laterality: N/A;   TRANSURETHRAL RESECTION OF BLADDER TUMOR N/A 04/18/2014   Procedure: TRANSURETHRAL RESECTION OF BLADDER TUMOR (TURBT), CYSTOGRAM;  Surgeon: [name], MD;  [facility];  Service: Urology;  Laterality: N/A;  TRANSURETHRAL RESECTION OF BLADDER TUMOR N/A 12/13/2019   Procedure: TRANSURETHRAL RESECTION OF BLADDER TUMOR (TURBT) GREATER THAN 5CM WITH CYSTOSCOPY/ POST OPERATIVE INSTILLATION OF GEMCITABINE;  Surgeon: [name], MD;  Location: [facility];  Service: Urology;  Laterality: N/A;   TRANSURETHRAL RESECTION OF BLADDER TUMOR N/A 10/06/2020   Procedure: TRANSURETHRAL RESECTION OF BLADDER TUMOR (TURBT)/ CYSTOSCOPY/  BLADDER BIOPSY;  Surgeon: [name], MD;  [facility];  Service: Urology;  Laterality: N/A;     Current Meds  Medication Sig   acetaminophen (TYLENOL) 500 MG tablet Take 1,000 mg by mouth every 8 (eight) hours as needed for moderate pain.   amLODipine (NORVASC) 2.5 MG tablet Take 1 tablet (2.5 mg total) by mouth daily. TAKE 1 TABLET(2.5 MG) BY MOUTH DAILY   dibucaine (NUPERCAINAL) 1 % OINT Place 1 application. rectally as needed for hemorrhoids. Pharmacy please provide fenestrated applicator for  application   gabapentin (NEURONTIN) 300 MG capsule Take 2 capsules (600 mg total) by mouth 3 (three) times daily.   losartan (COZAAR) 100 MG tablet TAKE 1 TABLET(100 MG) BY MOUTH AT BEDTIME   Menthol, Topical Analgesic, (BIOFREEZE EX) Apply 1 application topically 4 (four) times daily as needed (back pain).   metoprolol succinate (TOPROL-XL) 25 MG 24 hr tablet TAKE 1 TABLET(25 MG) BY MOUTH DAILY   oxyCODONE-acetaminophen (PERCOCET) 10-325 MG tablet Take 1 tablet by mouth every 4 (four) hours as needed for pain.   [START ON 10/28/2021] oxyCODONE-acetaminophen (PERCOCET) 10-325 MG tablet Take 1 tablet by mouth every 4 (four) hours as needed for pain.   pantoprazole (PROTONIX) 40 MG tablet Take 40 mg by mouth at bedtime.   polyethylene glycol (MIRALAX / GLYCOLAX) packet Take 17 g by mouth daily as needed for mild constipation.    promethazine (PHENERGAN) 12.5 MG tablet TAKE 1 TABLET(12.5 MG) BY MOUTH EVERY 8 HOURS AS NEEDED FOR NAUSEA OR VOMITING   sucralfate (CARAFATE) 1 g tablet TAKE 1 TABLET BY MOUTH ONE HOUR BEFORE EACH MEAL AND TWO HOURS AFTER OTHER MEDICATIONS      Allergies:   Patient has no known allergies.       ROS:  Please see the history of present illness.  .   All other systems are reviewed and negative.    PHYSICAL EXAM: VS:  BP (!) 173/103   Pulse 76   Ht '5\' 8"'$  (1.727 m)   Wt 115 lb (52.2 kg)   SpO2 95%   BMI 17.49 kg/m  , BMI Body mass index is 17.49 kg/m. Well developed and well nourished in no acute distress HENT normal Neck supple with JVP-flat Clear Device pocket well healed; without hematoma or erythema.  There is no tethering  Regular rate and rhythm, no murmur Abd-soft with active BS No Clubbing cyanosis edema Skin-warm and dry A & Oriented  Grossly normal sensory and motor function  ECG sinus P-synchronous/ AV  pacing  upright QRS lead V1 and lead 1 QRSd 114   ECG P synchronous pacing with an upright QRS lead V1 and a negative QRS lead I and a QRSd  of 118      ASSESSMENT AND PLAN:  Cardiomyopathy nonischemic  Congestive heart failure-chronic systolic  COPD/emphysema  Hypertension  Left bundle branch block  PVCs-infrequent  Sinus bradycardia-borderline  CRT-D Medtronic    T wave oversensing resulting in P wave being in refractory and thus not tracked  Gait instability    Euvolemic.  Poorly controlled hypertension.  Missed this at the patient's visit.  We will have her increase her amlodipine from 2.5--5 mg a day.  Discussed device generator replacement.  At this juncture she would like to proceed with high-voltage CRT-D replacement.  She is aware that this can be inactivated by programming.  She declined including her family and the conversation as she felt comfortable making the decisions and processing information on her own, not withstanding her comments to the nurses that she did not really remember so much her last conversations.  We have reviewed the benefits and risks of generator replacement.  These include but are not limited to lead fracture and infection.  The patient understands, agrees and is willing to proceed.     PVCs remain frequent we will continue her on metoprolol.  This can be uptitrated      Signed, Virl Axe, MD  10/14/2021 9:56 AM     [place] [address] [place] [address] [phone] (office) [phone] (fax)

## 2021-10-13 NOTE — Progress Notes (Unsigned)
Electrophysiology Office Note   Date:  10/14/2021   ID:  [name], DOB 25-Sep-1940, MRN [id number]  PCP:  [name], MD  Cardiologist:  MA Primary Electrophysiologist:  Virl Axe, MD    No chief complaint on file.    History of Present Illness: [name] is a 81 y.o. female seen in followup  for CRT-D implanted 2/16; device at [facility] and to discuss gen replacement and high voltage v low voltage   She has a history of a nonischemic cardiomyopathy with ejection fraction 25% and left bundle branch block.  She also had tracings in the hospital with the operating QRS in lead V1 suggesting alternating bundle branch block; however those times, the limb leads still demonstrated left bundle branch block  The patient denies chest pain, shortness of breath, nocturnal dyspnea, orthopnea or peripheral edema.  There have been no palpitations, lightheadedness or syncope.    Walks with walker and is back again to review implant options   DATE TEST EF   9/15 Myoview 28%   1/16 Cath   % CA min disease  10/17 Echo   55-65 %   6/20 Echo  60-65%     Date Cr K Hgb  7/17 0.55 4.1    1/19 0.73 4.9    3/20 0.8 4.1 12.5  11/20 0.5 4.2 14.3  12/22 0.53 4.9 13.5    At [facility] implantation of a neurostimulator for back pain assoc with new pains ( [facility] note 03/08/19) but back pain is better; she remains on narcotics with inadequate relief.    Past Medical History:  Diagnosis Date   AICD (automatic cardioverter/defibrillator) present EP cardiologist--- dr Caryl Comes    placement 05-07-2014 , ef 25%,  NICM/  (02-11-2019 last echo 08-31-2018 ef 60-65%)   Arthritis    "in about all my joints; for sure in my back"   Bladder cancer [facility]) urologist-  dr Junious Silk   dx 1995--  recurrent bladder cancer 2015 , s/p TURBT's and chemo instillation's ;   04/ 2019  s/p TURBT   Chronic hyponatremia    Chronic low back pain with bilateral sciatica    s/p  spinal cord stimulator @ [facility]  10-09-2018   Chronic  systolic (congestive) heart failure [facility])    cardiologist-  dr Rogue Jury   COPD with emphysema ([place])    (02-11-2019  per pt has never been on oxygen)   Coronary artery disease cardiologist-  dr Kathlyn [place]   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% per cardiac cath 03-03-2014   DDD (degenerative disc disease), thoracolumbar    Frequent urination    Full dentures    Gait instability    due to chronic low back pain, uses roller walker   GERD (gastroesophageal reflux disease)    History of iron deficiency anemia 11/2013   resolved w/ IV Iron  (02-11-2019  per pt has not had any issues since 2015)   History of stomach ulcers 11/2013   Hypertension    LBBB (left bundle branch block)    NICM (nonischemic cardiomyopathy) ([place]) last echo 12-31-2015 ef 55-60%   dx 09/ 2015 per echo 20%;  myoview 09/ 2015 ef 28%;  per cardiac cath 12/ 2015 ef 30%;     Nocturia more than twice per night    S/P insertion of spinal cord stimulator followed by [facility]---- Dr Pecola Leisure (notes in care everywhere)   10-09-2018  '@[facility]'$ --- thoracic spinal cord stimular/ generator  (device from [facility])----  per pt has a control   Scoliosis    Uses walker 02/18/2021   all the time   Wears glasses    for reading   Past Surgical History:  Procedure Laterality Date   BI-VENTRICULAR IMPLANTABLE CARDIOVERTER DEFIBRILLATOR N/A 05/07/2014   Procedure: BI-VENTRICULAR IMPLANTABLE CARDIOVERTER DEFIBRILLATOR  (CRT-D);  Surgeon: Deboraha Sprang, MD;  Location: [facility] CATH LAB;  Service: Cardiovascular;  Laterality: N/A;   CARDIAC CATHETERIZATION  03-03-2014  dr Kathlyn [place]   [facility]   pLAD 20%, pRCA 20%, dRCA 50%, RPLS 50%;  ef 30%, mild elevated LVEDP, mild gradient across aortic valve LVOT   CARDIOVASCULAR STRESS TEST  11/25/2013   High risk nuclear study w/ large high severity inferior wall perfusion defect on stress and rest images, large mild severity anteroseptal wall perfusion defect on stress and rest images, No inducible ischemia/  global moderate hypokinesis, ef 28%   CATARACT EXTRACTION W/ INTRAOCULAR LENS  IMPLANT, BILATERAL Bilateral right 12-2013 / left  02-2014   colonscopy  11/2020   benign polyps removed   CYSTOSCOPY W/ RETROGRADES Bilateral 01/06/2015   Procedure: CYSTOSCOPY WITH  BLADDER BIOPSY BILATERAL RETROGRADE PYELOGRAM,INSTILLATION OF MITOMYCIN C;  Surgeon: [name], MD;  [facility];  Service: Urology;  Laterality: Bilateral;   CYSTOSCOPY WITH BIOPSY N/A 10/06/2015   Procedure: CYSTO WITH BLADDER BIOPSY, FULGERATION, CHEMO IRRIGATION EPIRUBICIN IN PACU;  Surgeon: [name], MD;  [facility];  Service: Urology;  Laterality: N/A;   CYSTOSCOPY WITH BIOPSY N/A 02/12/2019   Procedure: CYSTOSCOPY WITH BIOPSY/ FULGURATION/ INSTILLATION OF GEMCITABINE, bilateral retrograde turbt greater 5cm;  Surgeon: [name], MD;  Location: [facility];  Service: Urology;  Laterality: N/A;   CYSTOSCOPY WITH BIOPSY N/A 03/19/2021   Procedure: CYSTOSCOPY WITH TRANSURETHRAL RESECTION OF BLADDER TUMOR  GREATER THAN 5CM WITH POST-OPERATIVE INSTILLATION OF EPIRUBICIN;  Surgeon: [name], MD;  [facility];  Service: Urology;  Laterality: N/A;   CYSTOSCOPY WITH FULGERATION N/A 06/30/2017   Procedure: Marland Kitchen CYSTOSCOPY WITH [place] FULGERATION/ BLADDER BIOPSY/ INSTILLATION OF EPIRUBICIN;  Surgeon: [name], MD;  Location: [facility];  Service: Urology;  Laterality: N/A;   CYSTOSCOPY WITH RETROGRADE PYELOGRAM, URETEROSCOPY AND STENT PLACEMENT Bilateral 04/18/2014   Procedure: CYSTOSCOPY WITH RETROGRADE PYELOGRAM;  Surgeon: [name], MD;  [facility];  Service: Urology;  Laterality: Bilateral;   ESOPHAGOGASTRODUODENOSCOPY N/A 11/28/2013   Procedure: ESOPHAGOGASTRODUODENOSCOPY (EGD);  Surgeon: Arta Silence, MD;  Location: [facility] ENDOSCOPY;  Service: Endoscopy;  Laterality: N/A;   LUMBAR DISC SURGERY  1980's   "ruptured disc"   SPINAL CORD  STIMULATOR IMPLANT  10-09-2018   '@[facility]'$    Thoracic spinal cord stimular/ genertor  (left flank)----- (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[employer])   TRANSTHORACIC ECHOCARDIOGRAM  12/31/2015   dr Caryl Comes   ef 38-756, grade 1 diastolic dysfunction/ mild MR/ septal motion showed abnormal function and dyssynergy   TRANSURETHRAL RESECTION OF BLADDER  1995   TRANSURETHRAL RESECTION OF BLADDER TUMOR N/A 12/13/2013   Procedure: TRANSURETHRAL RESECTION OF BLADDER TUMOR (TURBT);  Surgeon: [name], MD;  [facility];  Service: Urology;  Laterality: N/A;   TRANSURETHRAL RESECTION OF BLADDER TUMOR N/A 01/17/2014   Procedure: TRANSURETHRAL RESECTION OF BLADDER TUMOR (TURBT);  Surgeon: [name], MD;  [facility];  Service: Urology;  Laterality: N/A;   TRANSURETHRAL RESECTION OF BLADDER TUMOR N/A 04/18/2014   Procedure: TRANSURETHRAL RESECTION OF BLADDER TUMOR (TURBT), CYSTOGRAM;  Surgeon: [name], MD;  [facility];  Service: Urology;  Laterality: N/A;  TRANSURETHRAL RESECTION OF BLADDER TUMOR N/A 12/13/2019   Procedure: TRANSURETHRAL RESECTION OF BLADDER TUMOR (TURBT) GREATER THAN 5CM WITH CYSTOSCOPY/ POST OPERATIVE INSTILLATION OF GEMCITABINE;  Surgeon: [name], MD;  Location: [facility];  Service: Urology;  Laterality: N/A;   TRANSURETHRAL RESECTION OF BLADDER TUMOR N/A 10/06/2020   Procedure: TRANSURETHRAL RESECTION OF BLADDER TUMOR (TURBT)/ CYSTOSCOPY/  BLADDER BIOPSY;  Surgeon: [name], MD;  [facility];  Service: Urology;  Laterality: N/A;     Current Meds  Medication Sig   acetaminophen (TYLENOL) 500 MG tablet Take 1,000 mg by mouth every 8 (eight) hours as needed for moderate pain.   amLODipine (NORVASC) 2.5 MG tablet Take 1 tablet (2.5 mg total) by mouth daily. TAKE 1 TABLET(2.5 MG) BY MOUTH DAILY   dibucaine (NUPERCAINAL) 1 % OINT Place 1 application. rectally as needed for hemorrhoids. Pharmacy please provide fenestrated applicator for  application   gabapentin (NEURONTIN) 300 MG capsule Take 2 capsules (600 mg total) by mouth 3 (three) times daily.   losartan (COZAAR) 100 MG tablet TAKE 1 TABLET(100 MG) BY MOUTH AT BEDTIME   Menthol, Topical Analgesic, (BIOFREEZE EX) Apply 1 application topically 4 (four) times daily as needed (back pain).   metoprolol succinate (TOPROL-XL) 25 MG 24 hr tablet TAKE 1 TABLET(25 MG) BY MOUTH DAILY   oxyCODONE-acetaminophen (PERCOCET) 10-325 MG tablet Take 1 tablet by mouth every 4 (four) hours as needed for pain.   [START ON 10/28/2021] oxyCODONE-acetaminophen (PERCOCET) 10-325 MG tablet Take 1 tablet by mouth every 4 (four) hours as needed for pain.   pantoprazole (PROTONIX) 40 MG tablet Take 40 mg by mouth at bedtime.   polyethylene glycol (MIRALAX / GLYCOLAX) packet Take 17 g by mouth daily as needed for mild constipation.    promethazine (PHENERGAN) 12.5 MG tablet TAKE 1 TABLET(12.5 MG) BY MOUTH EVERY 8 HOURS AS NEEDED FOR NAUSEA OR VOMITING   sucralfate (CARAFATE) 1 g tablet TAKE 1 TABLET BY MOUTH ONE HOUR BEFORE EACH MEAL AND TWO HOURS AFTER OTHER MEDICATIONS      Allergies:   Patient has no known allergies.       ROS:  Please see the history of present illness.  .   All other systems are reviewed and negative.    PHYSICAL EXAM: VS:  BP (!) 173/103   Pulse 76   Ht '5\' 8"'$  (1.727 m)   Wt 115 lb (52.2 kg)   SpO2 95%   BMI 17.49 kg/m  , BMI Body mass index is 17.49 kg/m. Well developed and well nourished in no acute distress HENT normal Neck supple with JVP-flat Clear Device pocket well healed; without hematoma or erythema.  There is no tethering  Regular rate and rhythm, no murmur Abd-soft with active BS No Clubbing cyanosis edema Skin-warm and dry A & Oriented  Grossly normal sensory and motor function  ECG sinus P-synchronous/ AV  pacing  upright QRS lead V1 and lead 1 QRSd 114   ECG P synchronous pacing with an upright QRS lead V1 and a negative QRS lead I and a QRSd  of 118      ASSESSMENT AND PLAN:  Cardiomyopathy nonischemic  Congestive heart failure-chronic systolic  COPD/emphysema  Hypertension  Left bundle branch block  PVCs-infrequent  Sinus bradycardia-borderline  CRT-D [employer]    T wave oversensing resulting in P wave being in refractory and thus not tracked  Gait instability    Euvolemic.  Poorly controlled hypertension.  Missed this at the patient's visit.  We will have her increase her amlodipine from 2.5--5 mg a day.  Discussed device generator replacement.  At this juncture she would like to proceed with high-voltage CRT-D replacement.  She is aware that this can be inactivated by programming.  She declined including her family and the conversation as she felt comfortable making the decisions and processing information on her own, not withstanding her comments to the nurses that she did not really remember so much her last conversations.  We have reviewed the benefits and risks of generator replacement.  These include but are not limited to lead fracture and infection.  The patient understands, agrees and is willing to proceed.     PVCs remain frequent we will continue her on metoprolol.  This can be uptitrated      Signed, Virl Axe, MD  10/14/2021 9:56 AM     [place] [address] [place] [address] [phone] (office) [phone] (fax)

## 2021-10-14 ENCOUNTER — Encounter: Payer: Self-pay | Admitting: *Deleted

## 2021-10-14 ENCOUNTER — Ambulatory Visit (INDEPENDENT_AMBULATORY_CARE_PROVIDER_SITE_OTHER): Payer: Medicare Other | Admitting: Internal Medicine

## 2021-10-14 ENCOUNTER — Encounter: Payer: Self-pay | Admitting: Internal Medicine

## 2021-10-14 VITALS — BP 173/103 | HR 76 | Ht 68.0 in | Wt 115.0 lb

## 2021-10-14 DIAGNOSIS — I493 Ventricular premature depolarization: Secondary | ICD-10-CM

## 2021-10-14 DIAGNOSIS — I10 Essential (primary) hypertension: Secondary | ICD-10-CM | POA: Diagnosis not present

## 2021-10-14 DIAGNOSIS — I428 Other cardiomyopathies: Secondary | ICD-10-CM | POA: Diagnosis not present

## 2021-10-14 DIAGNOSIS — I5022 Chronic systolic (congestive) heart failure: Secondary | ICD-10-CM | POA: Diagnosis not present

## 2021-10-14 DIAGNOSIS — Z95810 Presence of automatic (implantable) cardiac defibrillator: Secondary | ICD-10-CM

## 2021-10-14 DIAGNOSIS — I447 Left bundle-branch block, unspecified: Secondary | ICD-10-CM

## 2021-10-14 DIAGNOSIS — Z01812 Encounter for preprocedural laboratory examination: Secondary | ICD-10-CM | POA: Diagnosis not present

## 2021-10-14 NOTE — Patient Instructions (Addendum)
Medication Instructions:  - Your physician recommends that you continue on your current medications as directed. Please refer to the Current Medication list given to you today.  *If you need a refill on your cardiac medications before your next appointment, please call your pharmacy*   Lab Work: - Your physician recommends that you lab work prior to 11/03/21:  BMP/ [place] Entrance at [facility] 1st desk on the right to check in (REGISTRATION)  Lab hours: Monday- Friday (7:30 am- 5:30 pm)   If you have labs (blood work) drawn today and your tests are completely normal, you will receive your results only by: MyChart Message (if you have MyChart) OR A paper copy in the mail If you have any lab test that is abnormal or we need to change your treatment, we will call you to review the results.   Testing/Procedures:  - Your physician has recommended that you have a defibrillator generator change.  - See the attached letter of instructions   Follow-Up: At [facility], you and your health needs are our priority.  As part of our continuing mission to provide you with exceptional heart care, we have created designated Provider Care Teams.  These Care Teams include your primary Cardiologist (physician) and Advanced Practice Providers (APPs -  Physician Assistants and Nurse Practitioners) who all work together to provide you with the care you need, when you need it.  We recommend signing up for the patient portal called "MyChart".  Sign up information is provided on this After Visit Summary.  MyChart is used to connect with patients for Virtual Visits (Telemedicine).  Patients are able to view lab/test results, encounter notes, upcoming appointments, etc.  Non-urgent messages can be sent to your provider as well.   To learn more about what you can do with MyChart, go to [email or web address].    Your next appointment:   1) 10-14 days (from 11/04/21) with the [facility] for a wound  check  2) 91 days (from 11/04/21) with Dr. Caryl Comes   The format for your next appointment:   In Person  Provider:   As above     Other Instructions N/a  Important Information About Sugar

## 2021-10-15 ENCOUNTER — Telehealth: Payer: Self-pay | Admitting: Internal Medicine

## 2021-10-15 ENCOUNTER — Other Ambulatory Visit: Payer: Self-pay | Admitting: Internal Medicine

## 2021-10-15 MED ORDER — AMLODIPINE BESYLATE 5 MG PO TABS: 5.0000 mg | ORAL_TABLET | Freq: Every day | ORAL | 2 refills | Status: DC

## 2021-10-15 NOTE — Telephone Encounter (Signed)
The patient called the office today stating she missed a call from our office yesterday after her office visit.  I advised the patient I had not tried to call her.   I advised Dr. Caryl Comes may have tried to reach out to her. Reviewed Dr. Olin Pia note from 10/14/21: Euvolemic.  Poorly controlled hypertension.  Missed this at the patient's visit.  We will have her increase her amlodipine from 2.5--5 mg a day.  I have advised the patient that per Dr. Caryl Comes, her BP has been elevated and was elevated at her OV yesterday. Dr. Caryl Comes is advising she increase her amlodipine from 2.5 mg to 5 mg once daily.  The patient voices understanding and is agreeable. She is aware I will update her RX at [facility].   She was very appreciative of the clarification.

## 2021-10-15 NOTE — Progress Notes (Signed)
Remote ICD transmission.   

## 2021-10-19 NOTE — Patient Instructions (Signed)
SURGICAL WAITING ROOM VISITATION Patients having surgery or a procedure may have no more than 2 support people in the waiting area - these visitors may rotate.   Children under the age of 82 must have an adult with them who is not the patient. If the patient needs to stay at the hospital during part of their recovery, the visitor guidelines for inpatient rooms apply. Pre-op nurse will coordinate an appropriate time for 1 support person to accompany patient in pre-op.  This support person may not rotate.    Please refer to the [facility] website for the visitor guidelines for Inpatients (after your surgery is over and you are in a regular room).       Your procedure is scheduled on: 11/02/2021    Report to [facility] Main Entrance    Report to admitting at   1100AM   Call this number if you have problems the morning of surgery [phone]   Do not eat food :After Midnight.   After Midnight you may have the following liquids until ___ 1000___ AM  DAY OF SURGERY  Water Non-Citrus Juices (without pulp, NO RED) Carbonated Beverages Black Coffee (NO MILK/CREAM OR CREAMERS, sugar ok)  Clear Tea (NO MILK/CREAM OR CREAMERS, sugar ok) regular and decaf                             Plain Jell-O (NO RED)                                           Fruit ices (not with fruit pulp, NO RED)                                     Popsicles (NO RED)                                                               Sports drinks like Gatorade (NO RED)                         Oral Hygiene is also important to reduce your risk of infection.                                    Remember - BRUSH YOUR TEETH THE MORNING OF SURGERY WITH YOUR REGULAR TOOTHPASTE   Do NOT smoke after Midnight   Take these medicines the morning of surgery with A SIP OF WATER:  toprol, amlodipine, gabapentin   DO NOT TAKE ANY ORAL DIABETIC MEDICATIONS DAY OF YOUR SURGERY  Bring CPAP mask and tubing day of surgery.                               You may not have any metal on your body including hair pins, jewelry, and body piercing             Do not wear make-up,  lotions, powders, perfumes/cologne, or deodorant  Do not wear nail polish including gel and S&S, artificial/acrylic nails, or any other type of covering on natural nails including finger and toenails. If you have artificial nails, gel coating, etc. that needs to be removed by a nail salon please have this removed prior to surgery or surgery may need to be canceled/ delayed if the surgeon/ anesthesia feels like they are unable to be safely monitored.   Do not shave  48 hours prior to surgery.               Men may shave face and neck.   Do not bring valuables to the hospital. [place].   Contacts, dentures or bridgework may not be worn into surgery.   Bring small overnight bag day of surgery.   DO NOT [place]. PHARMACY WILL DISPENSE MEDICATIONS LISTED ON YOUR MEDICATION LIST TO YOU DURING YOUR ADMISSION [place]!    Patients discharged on the day of surgery will not be allowed to drive home.  Someone NEEDS to stay with you for the first 24 hours after anesthesia.   Special Instructions: Bring a copy of your healthcare power of attorney and living will documents         the day of surgery if you haven't scanned them before.              Please read over the following fact sheets you were given: IF YOU HAVE QUESTIONS ABOUT YOUR PRE-OP INSTRUCTIONS PLEASE CALL [phone]     [facility] - Preparing for Surgery Before surgery, you can play an important role.  Because skin is not sterile, your skin needs to be as free of germs as possible.  You can reduce the number of germs on your skin by washing with CHG (chlorahexidine gluconate) soap before surgery.  CHG is an antiseptic cleaner which kills germs and bonds with the skin to continue killing germs even  after washing. Please DO NOT use if you have an allergy to CHG or antibacterial soaps.  If your skin becomes reddened/irritated stop using the CHG and inform your nurse when you arrive at Short Stay. Do not shave (including legs and underarms) for at least 48 hours prior to the first CHG shower.  You may shave your face/neck. Please follow these instructions carefully:  1.  Shower with CHG Soap the night before surgery and the  morning of Surgery.  2.  If you choose to wash your hair, wash your hair first as usual with your  normal  shampoo.  3.  After you shampoo, rinse your hair and body thoroughly to remove the  shampoo.                           4.  Use CHG as you would any other liquid soap.  You can apply chg directly  to the skin and wash                       Gently with a scrungie or clean washcloth.  5.  Apply the CHG Soap to your body ONLY FROM THE NECK DOWN.   Do not use on face/ open  Wound or open sores. Avoid contact with eyes, ears mouth and genitals (private parts).                       Wash face,  Genitals (private parts) with your normal soap.             6.  Wash thoroughly, paying special attention to the area where your surgery  will be performed.  7.  Thoroughly rinse your body with warm water from the neck down.  8.  DO NOT shower/wash with your normal soap after using and rinsing off  the CHG Soap.                9.  Pat yourself dry with a clean towel.            10.  Wear clean pajamas.            11.  Place clean sheets on your bed the night of your first shower and do not  sleep with pets. Day of Surgery : Do not apply any lotions/deodorants the morning of surgery.  Please wear clean clothes to the hospital/surgery center.  FAILURE TO FOLLOW THESE INSTRUCTIONS MAY RESULT IN THE CANCELLATION OF YOUR SURGERY PATIENT SIGNATURE_________________________________  NURSE  SIGNATURE__________________________________  ________________________________________________________________________

## 2021-10-19 NOTE — Progress Notes (Signed)
Anesthesia Review:  PCP: Deborra Medina- LOV 07/07/2021  Cardiologist : DR Jolyn Nap - LOv 10/14/21  Pain - [name]  Device Check - 09/30/21  Requested orders on 10/21/21.  Chest x-ray : EKG : 08/10/21  and 10/14/21  Echo : Stress test: Cardiac Cath :  Activity level: cannot do a flight of stiars without difficulty  Sleep Study/ CPAP : none  Fasting Blood Sugar :      / Checks Blood Sugar -- times a day:   Blood Thinner/ Instructions /Last Dose: ASA / Instructions/ Last Dose :   11/04/21- ICD Generator Change Out  PT has Spinal Cord Stimulator - to bring remote day of surgery  PT came alone to preop appt.  Alert and oriented x 3.  Answered all questions appropriately  Mobiltiy with rolling walker.  PT has Life Line which she states she has never had to use.   BMP done 10/21/21 routed to Dr [name]. Potassium- 5.4. Sodium- 129.  Lyndon Code, [facility] aware.   Pharmacy at preop appt to reconcile meds.

## 2021-10-21 ENCOUNTER — Encounter (HOSPITAL_COMMUNITY): Payer: Self-pay

## 2021-10-21 ENCOUNTER — Other Ambulatory Visit: Payer: Self-pay

## 2021-10-21 ENCOUNTER — Encounter (HOSPITAL_COMMUNITY)
Admission: RE | Admit: 2021-10-21 | Discharge: 2021-10-21 | Disposition: A | Discharge status: Home / Self Care | Payer: Medicare Other | Source: Ambulatory Visit | Attending: Urology | Admitting: Urology

## 2021-10-21 VITALS — BP 138/88 | HR 64 | Temp 98.2°F | Resp 16 | Ht 68.0 in | Wt 115.0 lb

## 2021-10-21 DIAGNOSIS — Z01812 Encounter for preprocedural laboratory examination: Secondary | ICD-10-CM | POA: Insufficient documentation

## 2021-10-21 DIAGNOSIS — Z01818 Encounter for other preprocedural examination: Secondary | ICD-10-CM

## 2021-10-21 DIAGNOSIS — I5022 Chronic systolic (congestive) heart failure: Secondary | ICD-10-CM | POA: Insufficient documentation

## 2021-10-21 DIAGNOSIS — I428 Other cardiomyopathies: Secondary | ICD-10-CM | POA: Insufficient documentation

## 2021-10-21 LAB — BASIC METABOLIC PANEL
Anion gap: 7 (ref 5–15)
BUN: 11 mg/dL (ref 8–23)
CO2: 25 mmol/L (ref 22–32)
Calcium: 9.6 mg/dL (ref 8.9–10.3)
Chloride: 97 mmol/L — ABNORMAL LOW (ref 98–111)
Creatinine, Ser: 0.5 mg/dL (ref 0.44–1.00)
GFR, Estimated: >60 mL/min (ref >60)
Glucose, Bld: 98 mg/dL (ref 70–99)
Potassium: 5.4 mmol/L — ABNORMAL HIGH (ref 3.5–5.1)
Sodium: 129 mmol/L — ABNORMAL LOW (ref 135–145)

## 2021-10-21 LAB — CBC
HCT: 41.5 % (ref 36.0–46.0)
Hemoglobin: 13.6 g/dL (ref 12.0–15.0)
MCH: 31.3 pg (ref 26.0–34.0)
MCHC: 32.8 g/dL (ref 30.0–36.0)
MCV: 95.6 fL (ref 80.0–100.0)
Platelets: 193 10*3/uL (ref 150–400)
RBC: 4.34 MIL/uL (ref 3.87–5.11)
RDW: 14.6 % (ref 11.5–15.5)
WBC: 6.2 10*3/uL (ref 4.0–10.5)
nRBC: 0 % (ref 0.0–0.2)

## 2021-10-22 ENCOUNTER — Telehealth: Payer: Self-pay

## 2021-10-22 NOTE — Telephone Encounter (Signed)
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 Position during surgery:   Please send documentation back to: [facility] (Fax # [phone]) [facility]  Athena Masse, RN  10/21/2021 1:18 PM

## 2021-10-22 NOTE — Telephone Encounter (Signed)
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 Patient's CRT-D is also at [facility] with a scheduled gen change 11/04/21. Reviewed with GT as SK is out of the office. GT advised patient postpone bladder surgery until approx 3 weeks post gen change. Patient updated. Contacted Dr. Linus Mako office and spoke to surgical scheduler Marlowe Kays who will discuss with Dr. Kerney Elbe. Routing note to Dr. Caryl Comes as it may be appropriate for a peer to peer discussion.

## 2021-10-25 ENCOUNTER — Encounter: Payer: Self-pay | Admitting: Internal Medicine

## 2021-10-25 ENCOUNTER — Telehealth: Payer: Self-pay

## 2021-10-25 NOTE — Progress Notes (Signed)
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  Position during surgery:   Please send documentation back to:  [facility] (Fax # [phone])  Device Information:  Clinic EP Physician:  Virl Axe, MD   Device Type:  Defibrillator Manufacturer and Phone #:  Medtronic: [id number] Pacemaker Dependent?:  Unknown Date of Last Device Check:  09/30/21 Normal Device Function?:  Yes.    Electrophysiologist's Recommendations:  Have magnet available. Provide continuous ECG monitoring when magnet is used or reprogramming is to be performed.  Procedure should not interfere with device function.  No device programming or magnet placement needed.  Per Device Clinic Standing Orders, Simone Curia, RN  11:46 AM 10/25/2021

## 2021-10-25 NOTE — Telephone Encounter (Signed)
[name] states she spoke with the doctor and they are going to move their procedure 3 weeks post the gen change procedure.

## 2021-10-26 ENCOUNTER — Other Ambulatory Visit
Admission: RE | Admit: 2021-10-26 | Discharge: 2021-10-26 | Disposition: A | Discharge status: Home / Self Care | Payer: Medicare Other | Attending: Internal Medicine | Admitting: Internal Medicine

## 2021-10-26 DIAGNOSIS — I428 Other cardiomyopathies: Secondary | ICD-10-CM | POA: Insufficient documentation

## 2021-10-26 DIAGNOSIS — Z01812 Encounter for preprocedural laboratory examination: Secondary | ICD-10-CM | POA: Insufficient documentation

## 2021-10-26 DIAGNOSIS — I5022 Chronic systolic (congestive) heart failure: Secondary | ICD-10-CM | POA: Diagnosis not present

## 2021-10-26 LAB — CBC
HCT: 40.5 % (ref 36.0–46.0)
Hemoglobin: 13.4 g/dL (ref 12.0–15.0)
MCH: 31.2 pg (ref 26.0–34.0)
MCHC: 33.1 g/dL (ref 30.0–36.0)
MCV: 94.2 fL (ref 80.0–100.0)
Platelets: 172 10*3/uL (ref 150–400)
RBC: 4.3 MIL/uL (ref 3.87–5.11)
RDW: 14.6 % (ref 11.5–15.5)
WBC: 6.1 10*3/uL (ref 4.0–10.5)
nRBC: 0 % (ref 0.0–0.2)

## 2021-10-26 LAB — BASIC METABOLIC PANEL
Anion gap: 9 (ref 5–15)
BUN: 12 mg/dL (ref 8–23)
CO2: 27 mmol/L (ref 22–32)
Calcium: 9.7 mg/dL (ref 8.9–10.3)
Chloride: 98 mmol/L (ref 98–111)
Creatinine, Ser: 0.71 mg/dL (ref 0.44–1.00)
GFR, Estimated: >60 mL/min (ref >60)
Glucose, Bld: 117 mg/dL — ABNORMAL HIGH (ref 70–99)
Potassium: 4.8 mmol/L (ref 3.5–5.1)
Sodium: 134 mmol/L — ABNORMAL LOW (ref 135–145)

## 2021-10-29 ENCOUNTER — Ambulatory Visit (INDEPENDENT_AMBULATORY_CARE_PROVIDER_SITE_OTHER): Payer: Medicare Other

## 2021-10-29 DIAGNOSIS — I428 Other cardiomyopathies: Secondary | ICD-10-CM | POA: Diagnosis not present

## 2021-10-29 LAB — CUP PACEART REMOTE DEVICE CHECK
Battery Remaining Longevity: 1 mo — CL
Battery Voltage: 2.64 V
Brady Statistic AP VP Percent: 47.59 %
Brady Statistic AP VS Percent: 0.79 %
Brady Statistic AS VP Percent: 50.34 %
Brady Statistic AS VS Percent: 1.29 %
Brady Statistic RA Percent Paced: 46.97 %
Brady Statistic RV Percent Paced: 94.48 %
Date Time Interrogation Session: 20230811033424
HighPow Impedance: 75 Ohm
Implantable Lead Implant Date: 20160217
Implantable Lead Implant Date: 20160217
Implantable Lead Implant Date: 20160217
Implantable Lead Location: 753858
Implantable Lead Location: 753859
Implantable Lead Location: 753860
Implantable Lead Model: 4298
Implantable Lead Model: 5076
Implantable Pulse Generator Implant Date: 20160217
Lead Channel Impedance Value: 399 Ohm
Lead Channel Impedance Value: 399 Ohm
Lead Channel Impedance Value: 475 Ohm
Lead Channel Impedance Value: 475 Ohm
Lead Channel Impedance Value: 475 Ohm
Lead Channel Impedance Value: 513 Ohm
Lead Channel Impedance Value: 589 Ohm
Lead Channel Impedance Value: 646 Ohm
Lead Channel Impedance Value: 836 Ohm
Lead Channel Impedance Value: 874 Ohm
Lead Channel Impedance Value: 931 Ohm
Lead Channel Impedance Value: 931 Ohm
Lead Channel Impedance Value: 931 Ohm
Lead Channel Pacing Threshold Amplitude: 0.625 V
Lead Channel Pacing Threshold Amplitude: 0.625 V
Lead Channel Pacing Threshold Amplitude: 1.25 V
Lead Channel Pacing Threshold Pulse Width: 0.4 ms
Lead Channel Pacing Threshold Pulse Width: 0.4 ms
Lead Channel Pacing Threshold Pulse Width: 0.4 ms
Lead Channel Sensing Intrinsic Amplitude: 2.75 mV
Lead Channel Sensing Intrinsic Amplitude: 2.75 mV
Lead Channel Sensing Intrinsic Amplitude: 22.75 mV
Lead Channel Sensing Intrinsic Amplitude: 22.75 mV
Lead Channel Setting Pacing Amplitude: 2 V
Lead Channel Setting Pacing Amplitude: 2.25 V
Lead Channel Setting Pacing Amplitude: 2.5 V
Lead Channel Setting Pacing Pulse Width: 0.4 ms
Lead Channel Setting Pacing Pulse Width: 0.4 ms
Lead Channel Setting Sensing Sensitivity: 0.45 mV

## 2021-11-04 ENCOUNTER — Ambulatory Visit
Admission: RE | Admit: 2021-11-04 | Discharge: 2021-11-04 | Disposition: A | Discharge status: Home / Self Care | Payer: Medicare Other | Attending: Internal Medicine | Admitting: Internal Medicine

## 2021-11-04 ENCOUNTER — Encounter: Payer: Self-pay | Admitting: Internal Medicine

## 2021-11-04 ENCOUNTER — Encounter
Admission: RE | Disposition: A | Discharge status: Home / Self Care | Payer: Self-pay | Source: Home / Self Care | Attending: Internal Medicine

## 2021-11-04 ENCOUNTER — Encounter: Payer: Self-pay | Admitting: Student in an Organized Health Care Education/Training Program

## 2021-11-04 ENCOUNTER — Other Ambulatory Visit: Payer: Self-pay

## 2021-11-04 DIAGNOSIS — I5022 Chronic systolic (congestive) heart failure: Secondary | ICD-10-CM | POA: Diagnosis not present

## 2021-11-04 DIAGNOSIS — J449 Chronic obstructive pulmonary disease, unspecified: Secondary | ICD-10-CM | POA: Insufficient documentation

## 2021-11-04 DIAGNOSIS — I447 Left bundle-branch block, unspecified: Secondary | ICD-10-CM | POA: Diagnosis not present

## 2021-11-04 DIAGNOSIS — R2689 Other abnormalities of gait and mobility: Secondary | ICD-10-CM | POA: Diagnosis not present

## 2021-11-04 DIAGNOSIS — Z45010 Encounter for checking and testing of cardiac pacemaker pulse generator [battery]: Secondary | ICD-10-CM

## 2021-11-04 DIAGNOSIS — Z95810 Presence of automatic (implantable) cardiac defibrillator: Secondary | ICD-10-CM

## 2021-11-04 DIAGNOSIS — Z79899 Other long term (current) drug therapy: Secondary | ICD-10-CM | POA: Diagnosis not present

## 2021-11-04 DIAGNOSIS — I428 Other cardiomyopathies: Secondary | ICD-10-CM | POA: Diagnosis not present

## 2021-11-04 DIAGNOSIS — Z4502 Encounter for adjustment and management of automatic implantable cardiac defibrillator: Secondary | ICD-10-CM | POA: Insufficient documentation

## 2021-11-04 DIAGNOSIS — I110 Hypertensive heart disease with heart failure: Secondary | ICD-10-CM | POA: Insufficient documentation

## 2021-11-04 HISTORY — PX: ICD GENERATOR CHANGEOUT: EP1231

## 2021-11-04 SURGERY — ICD GENERATOR CHANGEOUT
Anesthesia: Moderate Sedation

## 2021-11-04 MED ORDER — SODIUM CHLORIDE 0.9 % IV SOLN: INTRAVENOUS | Status: DC

## 2021-11-04 MED ORDER — LIDOCAINE HCL 1 % IJ SOLN
INTRAMUSCULAR | Status: AC
  Filled 2021-11-04: qty 40

## 2021-11-04 MED ORDER — ACETAMINOPHEN 325 MG PO TABS: 325.0000 mg | ORAL_TABLET | ORAL | Status: DC | PRN

## 2021-11-04 MED ORDER — CHLORHEXIDINE GLUCONATE CLOTH 2 % EX PADS
6.0000 | MEDICATED_PAD | Freq: Every day | CUTANEOUS | Status: DC
  Administered 2021-11-04: 6 via TOPICAL

## 2021-11-04 MED ORDER — CHLORHEXIDINE GLUCONATE 4 % EX LIQD: 4.0000 "application " | Freq: Once | CUTANEOUS | Status: DC

## 2021-11-04 MED ORDER — MIDAZOLAM HCL 2 MG/2ML IJ SOLN
INTRAMUSCULAR | Status: AC
  Filled 2021-11-04: qty 2

## 2021-11-04 MED ORDER — MIDAZOLAM HCL 2 MG/2ML IJ SOLN
INTRAMUSCULAR | Status: DC | PRN
  Administered 2021-11-04: 1 mg via INTRAVENOUS

## 2021-11-04 MED ORDER — SODIUM CHLORIDE 0.9 % IV SOLN
80.0000 mg | INTRAVENOUS | Status: AC
  Administered 2021-11-04: 80 mg
  Filled 2021-11-04: qty 2

## 2021-11-04 MED ORDER — CEFAZOLIN SODIUM-DEXTROSE 2-4 GM/100ML-% IV SOLN: 2.0000 g | INTRAVENOUS | Status: AC

## 2021-11-04 MED ORDER — LIDOCAINE HCL (PF) 1 % IJ SOLN
INTRAMUSCULAR | Status: DC | PRN
  Administered 2021-11-04: 40 mL

## 2021-11-04 MED ORDER — HEPARIN (PORCINE) IN NACL 1000-0.9 UT/500ML-% IV SOLN
INTRAVENOUS | Status: AC
  Filled 2021-11-04: qty 500

## 2021-11-04 MED ORDER — FENTANYL CITRATE (PF) 100 MCG/2ML IJ SOLN
INTRAMUSCULAR | Status: DC | PRN
Start: 2021-11-04 — End: 2021-11-04
  Administered 2021-11-04: 25 ug via INTRAVENOUS

## 2021-11-04 MED ORDER — CEFAZOLIN SODIUM-DEXTROSE 2-4 GM/100ML-% IV SOLN
INTRAVENOUS | Status: AC
  Administered 2021-11-04: 2 g via INTRAVENOUS
  Filled 2021-11-04: qty 100

## 2021-11-04 MED ORDER — FENTANYL CITRATE (PF) 100 MCG/2ML IJ SOLN
INTRAMUSCULAR | Status: AC
  Filled 2021-11-04: qty 2

## 2021-11-04 SURGICAL SUPPLY — 19 items
ADH SKN CLS APL DERMABOND .7 (GAUZE/BANDAGES/DRESSINGS) ×1
CABLE SURG 12 DISP A/V CHANNEL (MISCELLANEOUS) IMPLANT
DERMABOND ADVANCED (GAUZE/BANDAGES/DRESSINGS) ×1
DERMABOND ADVANCED .7 DNX12 (GAUZE/BANDAGES/DRESSINGS) IMPLANT
DEVICE DSSCT PLSMBLD 3.0S LGHT (MISCELLANEOUS) IMPLANT
ELECT REM PT RETURN 9FT ADLT (ELECTROSURGICAL) ×1 IMPLANT
ELECTRODE REM PT RTRN 9FT ADLT (ELECTROSURGICAL) IMPLANT
HEMOSTAT SURGICEL 2X4 FIBR (HEMOSTASIS) IMPLANT
ICD CLARIA MRI DTMA1QQ (ICD Generator) IMPLANT
PAD ELECT DEFIB RADIOL ZOLL (MISCELLANEOUS) IMPLANT
PLASMABLADE 3.0S W/LIGHT (MISCELLANEOUS) ×1 IMPLANT
SUT VIC AB 2-0 CT1 27 (SUTURE) ×1
SUT VIC AB 2-0 CT1 TAPERPNT 27 (SUTURE) IMPLANT
SUT VIC AB 3-0 CT1 27 (SUTURE) ×1
SUT VIC AB 3-0 CT1 TAPERPNT 27 (SUTURE) IMPLANT
SUT VIC AB 3-0 SH 27 (SUTURE)
SUT VIC AB 3-0 SH 27X BRD (SUTURE) IMPLANT
SUT VIC AB 3-0 X1 27 (SUTURE) IMPLANT
TRAY PACEMAKER INSERTION (PACKS) ×2 IMPLANT

## 2021-11-04 NOTE — Interval H&P Note (Signed)
History and Physical Interval Note:  11/04/2021 7:13 AM  [name]  has presented today for surgery, with the diagnosis of ICD Pacemaker [place]    Pacemaker generator end of life      MedTronic Rep.  The various methods of treatment have been discussed with the patient and family. After consideration of risks, benefits and other options for treatment, the patient has consented to  Procedure(s): ICD Venus (N/A) as a surgical intervention.  The patient's history has been reviewed, patient examined, no change in status, stable for surgery.  I have reviewed the patient's chart and labs.  Questions were answered to the patient's satisfaction.     Virl Axe

## 2021-11-04 NOTE — Discharge Instructions (Addendum)
Dermabond will come off in the next 10 - 14 days; If not, it will be removed at scheduled wound check. Keep dry until tomorrow evening. No driving X 4 days. Wound check in office as scheduled.

## 2021-11-05 ENCOUNTER — Encounter: Payer: Self-pay | Admitting: Internal Medicine

## 2021-11-17 ENCOUNTER — Ambulatory Visit: Payer: Medicare Other | Attending: Cardiology

## 2021-11-17 ENCOUNTER — Encounter (HOSPITAL_COMMUNITY): Payer: Medicare Other

## 2021-11-17 ENCOUNTER — Other Ambulatory Visit (HOSPITAL_COMMUNITY): Payer: Self-pay | Admitting: Urology

## 2021-11-17 DIAGNOSIS — I428 Other cardiomyopathies: Secondary | ICD-10-CM | POA: Insufficient documentation

## 2021-11-17 LAB — CUP PACEART INCLINIC DEVICE CHECK
Date Time Interrogation Session: 20230830163100
Implantable Lead Implant Date: 20160217
Implantable Lead Implant Date: 20160217
Implantable Lead Implant Date: 20160217
Implantable Lead Location: 753858
Implantable Lead Location: 753859
Implantable Lead Location: 753860
Implantable Lead Model: 4298
Implantable Lead Model: 5076
Implantable Pulse Generator Implant Date: 20230817

## 2021-11-17 NOTE — Progress Notes (Signed)
Wound check appointment. Dermabond removed. Wound without redness or edema. Incision edges approximated, wound well healed. Normal device function. Thresholds, sensing, and impedances consistent with implant measurements. Device programmed at 3.5V for extra safety margin until 3 month visit. Histogram distribution appropriate for patient and level of activity. No mode switches or ventricular arrhythmias noted. Patient educated about wound care and shock plan. ROV in 3 months with implanting physician.

## 2021-11-17 NOTE — Patient Instructions (Signed)
   After Your ICD (Implantable Cardiac Defibrillator)    Monitor your defibrillator site for redness, swelling, and drainage. Call the device clinic at [phone] if you experience these symptoms or fever/chills.  Your incision was closed with Dermabond:  You may shower 1 day after your defibrillator implant and wash your incision with soap and water. Avoid lotions, ointments, or perfumes over your incision until it is well-healed.  You may use a hot tub or a pool after your wound check appointment if the incision is completely closed.  Your ICD is designed to protect you from life threatening heart rhythms. Because of this, you may receive a shock.   1 shock with no symptoms:  Call the office during business hours. 1 shock with symptoms (chest pain, chest pressure, dizziness, lightheadedness, shortness of breath, overall feeling unwell):  Call 911. If you experience 2 or more shocks in 24 hours:  Call 911. If you receive a shock, you should not drive.  [place] DMV - no driving for 6 months if you receive appropriate therapy from your ICD.   ICD Alerts:  Some alerts are vibratory and others beep. These are NOT emergencies. Please call our office to let us know. If this occurs at night or on weekends, it can wait until the next business day. Send a remote transmission.  If your device is capable of reading fluid status (for heart failure), you will be offered monthly monitoring to review this with you.   Remote monitoring is used to monitor your ICD from home. This monitoring is scheduled every 91 days by our office. It allows [place] to keep an eye on the functioning of your device to ensure it is working properly. You will routinely see your Electrophysiologist annually (more often if necessary).

## 2021-11-18 ENCOUNTER — Ambulatory Visit
Payer: Medicare Other | Attending: Student in an Organized Health Care Education/Training Program | Admitting: Student in an Organized Health Care Education/Training Program

## 2021-11-18 ENCOUNTER — Other Ambulatory Visit (HOSPITAL_COMMUNITY): Payer: Self-pay | Admitting: Urology

## 2021-11-18 ENCOUNTER — Other Ambulatory Visit: Payer: Self-pay

## 2021-11-18 ENCOUNTER — Encounter: Payer: Self-pay | Admitting: Student in an Organized Health Care Education/Training Program

## 2021-11-18 DIAGNOSIS — M5416 Radiculopathy, lumbar region: Secondary | ICD-10-CM

## 2021-11-18 DIAGNOSIS — M47816 Spondylosis without myelopathy or radiculopathy, lumbar region: Secondary | ICD-10-CM

## 2021-11-18 DIAGNOSIS — G894 Chronic pain syndrome: Secondary | ICD-10-CM | POA: Diagnosis not present

## 2021-11-18 MED ORDER — OXYCODONE-ACETAMINOPHEN 10-325 MG PO TABS: 1.0000 | ORAL_TABLET | ORAL | 0 refills | Status: DC | PRN

## 2021-11-18 MED ORDER — OXYCODONE-ACETAMINOPHEN 10-325 MG PO TABS: 1.0000 | ORAL_TABLET | ORAL | 0 refills | Status: AC | PRN

## 2021-11-18 MED ORDER — GABAPENTIN 300 MG PO CAPS: 600.0000 mg | ORAL_CAPSULE | Freq: Three times a day (TID) | ORAL | 1 refills | Status: DC

## 2021-11-18 NOTE — Progress Notes (Signed)
PROVIDER NOTE: Information contained herein reflects review and annotations entered in association with encounter. Interpretation of such information and data should be left to medically-trained personnel. Information provided to patient can be located elsewhere in the medical record under "Patient Instructions". Document created using STT-dictation technology, any transcriptional errors that may result from process are unintentional.    Patient: [name]  Service Category: E/M  Provider: [name], MD  DOB: Aug 18, 1940  DOS: 11/18/2021  Specialty: Interventional Pain Management  MRN: [id number]  Setting: Ambulatory outpatient  PCP: Bianca Mc, MD  Type: Established Patient    Referring Provider: [name], MD  Location: Office  Delivery: Face-to-face     HPI  [name], a 81 y.o. year old female, is here today because of her No primary diagnosis found.. [name] primary complain today is Back Pain ("Lower to buttocks bilat; concetrated in rectum") Last encounter: My last encounter with her was on  Pertinent problems: [name] has Spinal stenosis of thoracolumbar region; Spinal stenosis in cervical region; Spinal stenosis, lumbar region, with neurogenic claudication; Lumbar spondylosis; SI joint arthritis; Lumbar degenerative disc disease; Chronic pain syndrome; Lumbar radiculopathy; Lumbar facet arthropathy; and Back pain on their pertinent problem list. Pain Assessment: Severity of Chronic pain is reported as a 9 /10. Location: Back Lower/to buttocks bilat/rectum. Onset: More than a month ago. Quality: Constant, Aching. Timing: Constant. Modifying factor(s): meds. Vitals:  height is _0  (1.727 m) and weight is 115 lb (52.2 kg). Her temporal temperature is 97 F (36.1 C) (abnormal). Her blood pressure is 160/92 (abnormal) and her pulse is 76. Her respiration is 16 and oxygen saturation is 97%.   Reason for encounter: medication management.   [name] follows up today for  medication management.  She has severe pain in her lower back related to lumbar spinal stenosis and severe neurogenic claudication along with severe lumbar spondylosis.  She has tried various injection therapies including epidural steroid injections, lumbar medial branch nerve blocks with limited response.  She went on to have a lumbar spinal cord stimulator implant with [facility] and also states that this was not as helpful as she had hoped.  She has participated in a drug holiday for 4 weeks where she weaned her opioid intake by approximately 50%.  She states that she was unable to function and had to resume her previous dose.  I have discussed the phenomena of central sensitization as well as opioid-induced hyperalgesia at length with her.  I have encouraged her to wean her opioid analgesics however patient states that she is unable to do so which was further reinforced by her drug holiday.  I have very limited options for [name] other than maintaining her current medications.  We will need to renew our annual urine toxicology screen for medication compliance and monitoring.   Pharmacotherapy Assessment  Analgesic: Percocet 10 mg up to 5 times a day as needed for severe breakthrough pain, quantity 150/month; MME equals 75   Monitoring: [place] PMP: PDMP reviewed during this encounter.       Pharmacotherapy: No side-effects or adverse reactions reported. Compliance: No problems identified. Effectiveness: Clinically acceptable.  Bianca Patience, RN  11/18/2021  2:11 PM  Sign when Signing Visit Nursing Pain Medication Assessment:  Safety precautions to be maintained throughout the outpatient stay will include: orient to surroundings, keep bed in low position, maintain call bell within reach at all times, provide assistance with transfer out of bed and ambulation.  Medication Inspection Compliance: Pill count conducted under aseptic conditions, in front of the patient. Neither the pills nor the  bottle was removed from the patient's sight at any time. Once count was completed pills were immediately returned to the patient in their original bottle.  Medication: Oxycodone/APAP Pill/Patch Count:  52 of 150 pills remain Pill/Patch Appearance: Markings consistent with prescribed medication Bottle Appearance: Standard pharmacy container. Clearly labeled. Filled Date: 08 / 10 / 2023 Last Medication intake:  Today   UDS:  Summary  Date Value Ref Range Status  08/11/2020 Note  Final    Comment:    ==================================================================== ToxASSURE Select 13 (MW) ==================================================================== Test                             Result       Flag       Units  Drug Present and Declared for Prescription Verification   Oxycodone                      4370         EXPECTED   ng/mg creat   Oxymorphone                    8753         EXPECTED   ng/mg creat   Noroxycodone                   12500        EXPECTED   ng/mg creat   Noroxymorphone                 2957         EXPECTED   ng/mg creat    Sources of oxycodone are scheduled prescription medications.    Oxymorphone, noroxycodone, and noroxymorphone are expected    metabolites of oxycodone. Oxymorphone is also available as a    scheduled prescription medication.  ==================================================================== Test                      Result    Flag   Units      Ref Range   Creatinine              30               mg/dL      >=20 ==================================================================== Declared Medications:  The flagging and interpretation on this report are based on the  following declared medications.  Unexpected results may arise from  inaccuracies in the declared medications.   **Note: The testing scope of this panel includes these medications:   Oxycodone (Percocet)   **Note: The testing scope of this panel does not include the   following reported medications:   Acetaminophen  Acetaminophen (Percocet)  Amitriptyline  Amlodipine (Norvasc)  Gabapentin  Losartan  Menthol  Metoprolol  Polyethylene Glycol (MiraLAX)  Promethazine  Rosuvastatin  Sucralfate (Carafate) ==================================================================== For clinical consultation, please call [phone]. ====================================================================      ROS  Constitutional: Denies any fever or chills Gastrointestinal: No reported hemesis, hematochezia, vomiting, or acute GI distress Musculoskeletal:  Low back, leg pain Neurological: No reported episodes of acute onset apraxia, aphasia, dysarthria, agnosia, amnesia, paralysis, loss of coordination, or loss of consciousness  Medication Review  Menthol (Topical Analgesic), acetaminophen, amLODipine, gabapentin, metoprolol succinate, oxyCODONE-acetaminophen, pantoprazole, polyethylene glycol, promethazine, and sucralfate  History Review  Allergy: Ms. Matas has No Known  Allergies. Drug: Ms. Devlin  reports no history of drug use. Alcohol:  reports no history of alcohol use. Tobacco:  reports that she has been smoking cigarettes. She has a 28.00 pack-year smoking history. She has never used smokeless tobacco. Social: Ms. Birdwell  reports that she has been smoking cigarettes. She has a 28.00 pack-year smoking history. She has never used smokeless tobacco. She reports that she does not drink alcohol and does not use drugs. Medical:  has a past medical history of AICD (automatic cardioverter/defibrillator) present ([place] cardiologist--- dr Caryl Comes ), Arthritis, Bladder cancer [facility]) (urologist-  dr Junious Silk), Chronic hyponatremia, Chronic low back pain with bilateral sciatica, Chronic systolic (congestive) heart failure ([place]), COPD with emphysema ([place]), Coronary artery disease (cardiologist-  dr Kathlyn [place]), DDD (degenerative disc disease), thoracolumbar, Frequent  urination, Full dentures, Gait instability, GERD (gastroesophageal reflux disease), History of iron deficiency anemia (11/2013), History of stomach ulcers (11/2013), Hypertension, LBBB (left bundle branch block), NICM (nonischemic cardiomyopathy) ([place]) (last echo 12-31-2015 ef 55-60%), Nocturia more than twice per night, S/P insertion of spinal cord stimulator (followed by [facility]---- Dr Pecola Leisure (notes in care everywhere)), Scoliosis, Uses walker (02/18/2021), and Wears glasses. Surgical: Ms. Heather  has a past surgical history that includes Esophagogastroduodenoscopy (N/A, 11/28/2013); Transurethral resection of bladder tumor (N/A, 12/13/2013); Transurethral resection of bladder tumor (N/A, 01/17/2014); Cataract extraction w/ intraocular lens  implant, bilateral (Bilateral, right 12-2013 / left  02-2014); Cystoscopy with retrograde pyelogram, ureteroscopy and stent placement (Bilateral, 04/18/2014); Transurethral resection of bladder tumor (N/A, 04/18/2014); Lumbar disc surgery (1980's); bi-ventricular implantable cardioverter defibrillator (N/A, 05/07/2014); Cystoscopy w/ retrogrades (Bilateral, 01/06/2015); Cystoscopy with biopsy (N/A, 10/06/2015); Cardiac catheterization (03-03-2014  dr Kathlyn [place]   [facility]); Transurethral resection of bladder (1995); Cardiovascular stress test (11/25/2013); transthoracic echocardiogram (12/31/2015   dr Caryl Comes); Cystoscopy with fulgeration (N/A, 06/30/2017); Spinal cord stimulator implant (10-09-2018   _0 ); Cystoscopy with biopsy (N/A, 02/12/2019); Transurethral resection of bladder tumor (N/A, 12/13/2019); Transurethral resection of bladder tumor (N/A, 10/06/2020); colonscopy (11/2020); Cystoscopy with biopsy (N/A, 03/19/2021); and ICD GENERATOR CHANGEOUT (N/A, 11/04/2021). Family: family history includes Cancer in her brother and mother.  Laboratory Chemistry Profile   Renal Lab Results  Component Value Date   BUN 12 10/26/2021   CREATININE 0.71 10/26/2021    BCR 18 04/28/2014   GFR 89.19 08/27/2019   GFRAA >60 06/17/2019   GFRNONAA >60 10/26/2021    Hepatic Lab Results  Component Value Date   AST 33 12/09/2020   ALT 25 12/09/2020   ALBUMIN 4.6 12/09/2020   ALKPHOS 78 12/09/2020   LIPASE 30 11/23/2013    Electrolytes Lab Results  Component Value Date   NA 134 (L) 10/26/2021   K 4.8 10/26/2021   CL 98 10/26/2021   CALCIUM 9.7 10/26/2021   MG 2.3 06/03/2019    Bone Lab Results  Component Value Date   VD25OH 36.17 01/11/2019    Inflammation (CRP: Acute Phase) (ESR: Chronic Phase) No results found for: "CRP", "ESRSEDRATE", "LATICACIDVEN"       Note: Above Lab results reviewed.  Recent Imaging Review  CUP PACEART INCLINIC DEVICE CHECK Wound check appointment. Dermabond removed. Wound without redness or edema. Incision edges approximated, wound well healed. Normal device function. Thresholds, sensing, and impedances consistent with implant measurements. Device programmed at 3.5V for  extra safety margin until 3 month visit. Histogram distribution appropriate for patient and level of activity. No mode switches or ventricular arrhythmias noted. Patient educated about wound care and shock plan. ROV in 3 months  with implanting  physician.Lavenia Atlas, BSN, RN Note: Reviewed        Physical Exam  General appearance: alert, cooperative, demanding, and in moderate distress Mental status: Alert, oriented x 3 (person, place, & time)       Respiratory: No evidence of acute respiratory distress Eyes: PERLA Vitals: BP (!) 160/92   Pulse 76   Temp (!) 97 F (36.1 C) (Temporal)   Resp 16   Ht _0  (1.727 m)   Wt 115 lb (52.2 kg)   SpO2 97%   BMI 17.49 kg/m  BMI: Estimated body mass index is 17.49 kg/m as calculated from the following:   Height as of this encounter: _1  (1.727 m).   Weight as of this encounter: 115 lb (52.2 kg). Ideal: Ideal body weight: 63.9 kg (140 lb 14 oz)    Lumbar Exam  Skin & Axial Inspection: Well  healed scar from previous spine surgery detected, IPG for SCS in place Alignment: Symmetrical Functional ROM: Pain restricted ROM       Stability: No instability detected Muscle Tone/Strength: Functionally intact. No obvious neuro-muscular anomalies detected. Sensory (Neurological): Dermatomal pain pattern Palpation: No palpable anomalies           Gait & Posture Assessment  Ambulation: Patient ambulates using a walker Gait: Limited. Using assistive device to ambulate Posture: Difficulty standing up straight, due to pain    Lower Extremity Exam      Side: Right lower extremity   Side: Left lower extremity  Stability: No instability observed           Stability: No instability observed          Skin & Extremity Inspection: Skin color, temperature, and hair growth are WNL. No peripheral edema or cyanosis. No masses, redness, swelling, asymmetry, or associated skin lesions. No contractures.   Skin & Extremity Inspection: Skin color, temperature, and hair growth are WNL. No peripheral edema or cyanosis. No masses, redness, swelling, asymmetry, or associated skin lesions. No contractures.  Functional ROM: Pain restricted ROM for all joints of the lower extremity           Functional ROM: Pain restricted ROM for all joints of the lower extremity          Muscle Tone/Strength: Functionally intact. No obvious neuro-muscular anomalies detected.   Muscle Tone/Strength: Functionally intact. No obvious neuro-muscular anomalies detected.  Sensory (Neurological): Dermatomal pain pattern         Sensory (Neurological): Dermatomal pain pattern        DTR: Patellar: deferred today Achilles: deferred today Plantar: deferred today   DTR: Patellar: deferred today Achilles: deferred today Plantar: deferred today  Palpation: No palpable anomalies   Palpation: No palpable anomalies        Assessment   Diagnosis  1. Lumbar radiculopathy   2. Lumbar facet arthropathy   3. Chronic pain syndrome       Plan of Care   Ms. MERTIS MOSHER has a current medication list which includes the following long-term medication(s): amlodipine, gabapentin, metoprolol succinate, promethazine, and sucralfate.   Pharmacotherapy (Medications Ordered): Meds ordered this encounter  Medications   DISCONTD: oxyCODONE-acetaminophen (PERCOCET) 10-325 MG tablet    Sig: Take 1 tablet by mouth every 4 (four) hours as needed for pain.    Dispense:  150 tablet    Refill:  0   DISCONTD: oxyCODONE-acetaminophen (PERCOCET) 10-325 MG tablet    Sig: Take 1 tablet by mouth every 4 (four) hours as needed  for pain.    Dispense:  150 tablet    Refill:  0   DISCONTD: oxyCODONE-acetaminophen (PERCOCET) 10-325 MG tablet    Sig: Take 1 tablet by mouth every 4 (four) hours as needed for pain.    Dispense:  150 tablet    Refill:  0   oxyCODONE-acetaminophen (PERCOCET) 10-325 MG tablet    Sig: Take 1 tablet by mouth every 4 (four) hours as needed for pain.    Dispense:  150 tablet    Refill:  0   oxyCODONE-acetaminophen (PERCOCET) 10-325 MG tablet    Sig: Take 1 tablet by mouth every 4 (four) hours as needed for pain.    Dispense:  150 tablet    Refill:  0   oxyCODONE-acetaminophen (PERCOCET) 10-325 MG tablet    Sig: Take 1 tablet by mouth every 4 (four) hours as needed for pain.    Dispense:  150 tablet    Refill:  0    Orders Placed This Encounter  Procedures   ToxASSURE Select 13 (MW), Urine    Volume: 30 ml(s). Minimum 3 ml of urine is needed. Document temperature of fresh sample. Indications: Long term (current) use of opiate analgesic (Q00.867)    Order Specific Question:   Release to patient    Answer:   Immediate   Continue with spinal cord stimulation.  Follow-up plan:   Return in about 3 months (around 02/17/2022) for Medication Management, in person.    Recent Visits No visits were found meeting these conditions. Showing recent visits within past 90 days and meeting all other  requirements Today's Visits Date Type Provider Dept  11/18/21 Office Visit Bianca Santa, MD [facility]  Showing today's visits and meeting all other requirements Future Appointments Date Type Provider Dept  02/15/22 Appointment Bianca Santa, MD [facility]  Showing future appointments within next 90 days and meeting all other requirements  I discussed the assessment and treatment plan with the patient. The patient was provided an opportunity to ask questions and all were answered. The patient agreed with the plan and demonstrated an understanding of the instructions.  Patient advised to call back or seek an in-person evaluation if the symptoms or condition worsens.  Duration of encounter: 69mnutes.  Note by: BGillis Santa MD Date: 11/18/2021; Time: 2:35 PM

## 2021-11-18 NOTE — Progress Notes (Signed)
Rx sent to [facility] today have been cancelled and BL resent to [facility].

## 2021-11-18 NOTE — Progress Notes (Signed)
Nursing Pain Medication Assessment:  Safety precautions to be maintained throughout the outpatient stay will include: orient to surroundings, keep bed in low position, maintain call bell within reach at all times, provide assistance with transfer out of bed and ambulation.  Medication Inspection Compliance: Pill count conducted under aseptic conditions, in front of the patient. Neither the pills nor the bottle was removed from the patient's sight at any time. Once count was completed pills were immediately returned to the patient in their original bottle.  Medication: Oxycodone/APAP Pill/Patch Count:  52 of 150 pills remain Pill/Patch Appearance: Markings consistent with prescribed medication Bottle Appearance: Standard pharmacy container. Clearly labeled. Filled Date: 08 / 10 / 2023 Last Medication intake:  Today

## 2021-11-19 ENCOUNTER — Encounter: Payer: Self-pay | Admitting: Internal Medicine

## 2021-11-19 ENCOUNTER — Other Ambulatory Visit (HOSPITAL_COMMUNITY): Payer: Medicare Other

## 2021-11-19 ENCOUNTER — Ambulatory Visit (INDEPENDENT_AMBULATORY_CARE_PROVIDER_SITE_OTHER): Payer: Medicare Other | Admitting: Internal Medicine

## 2021-11-19 VITALS — BP 122/76 | HR 79 | Temp 98.0°F | Ht 68.0 in | Wt 115.4 lb

## 2021-11-19 DIAGNOSIS — G8929 Other chronic pain: Secondary | ICD-10-CM

## 2021-11-19 DIAGNOSIS — K6289 Other specified diseases of anus and rectum: Secondary | ICD-10-CM | POA: Diagnosis not present

## 2021-11-19 DIAGNOSIS — Z9689 Presence of other specified functional implants: Secondary | ICD-10-CM

## 2021-11-19 MED ORDER — AMITRIPTYLINE HCL 50 MG PO TABS: 50.0000 mg | ORAL_TABLET | Freq: Every day | ORAL | 1 refills | Status: DC

## 2021-11-19 MED ORDER — LOSARTAN POTASSIUM 100 MG PO TABS
100.0000 mg | ORAL_TABLET | Freq: Every day | ORAL | 1 refills | Status: DC
Start: 2021-11-19 — End: 2022-10-04

## 2021-11-19 NOTE — Progress Notes (Signed)
Remote ICD transmission.   

## 2021-11-19 NOTE — Progress Notes (Unsigned)
Subjective:  Patient ID: [name], female    DOB: 1941-02-09  Age: 81 y.o. MRN: [id number]  CC: There were no encounter diagnoses.   HPI [name] presents for  Chief Complaint  Patient presents with   Rectal Pain   1) chronic rectal pain :  she has exhausted the modalities available to her by he local pain clinic and has GI evaluation as well to rule out cacer,  Last colonoscopy 2023  report not available from [facility] ,  [place]  she is taking 4 motrin every 4 hours,  4 tylenol every 4 hours,  and 5 oxycodone daily.    The pain is aggravated by sitting.  Lying down helps . She has 2 hours of relief , the hours surrounding each oxycodone dose.  She reports that she is aware she is taking too much ibuprofen   but she is desperate.  She tried amitryptiline for 2 weeks and it did not help.  Takign gabapentin at night.      2) hypertension taking amlodipine and metoprolol   losartan  needs refill  Outpatient Medications Prior to Visit  Medication Sig Dispense Refill   acetaminophen (TYLENOL) 500 MG tablet Take 1,000 mg by mouth every 8 (eight) hours as needed for moderate pain.     amLODipine (NORVASC) 5 MG tablet Take 1 tablet (5 mg total) by mouth daily. 90 tablet 2   gabapentin (NEURONTIN) 300 MG capsule Take 2 capsules (600 mg total) by mouth 3 (three) times daily. 270 capsule 1   Menthol, Topical Analgesic, (BIOFREEZE EX) Apply 1 application topically 4 (four) times daily as needed (back pain).     metoprolol succinate (TOPROL-XL) 25 MG 24 hr tablet TAKE 1 TABLET(25 MG) BY MOUTH DAILY (Patient taking differently: Take 25 mg by mouth daily.) 90 tablet 3   [START ON 11/27/2021] oxyCODONE-acetaminophen (PERCOCET) 10-325 MG tablet Take 1 tablet by mouth every 4 (four) hours as needed for pain. 150 tablet 0   [START ON 12/27/2021] oxyCODONE-acetaminophen (PERCOCET) 10-325 MG tablet Take 1 tablet by mouth every 4 (four) hours as needed for pain. 150 tablet 0    [START ON 01/26/2022] oxyCODONE-acetaminophen (PERCOCET) 10-325 MG tablet Take 1 tablet by mouth every 4 (four) hours as needed for pain. 150 tablet 0   pantoprazole (PROTONIX) 40 MG tablet Take 40 mg by mouth at bedtime.     polyethylene glycol (MIRALAX / GLYCOLAX) packet Take 17 g by mouth daily as needed for mild constipation.      promethazine (PHENERGAN) 12.5 MG tablet TAKE 1 TABLET(12.5 MG) BY MOUTH EVERY 8 HOURS AS NEEDED FOR NAUSEA OR VOMITING (Patient taking differently: Take 12.5 mg by mouth every 8 (eight) hours as needed for nausea or vomiting.) 30 tablet 2   sucralfate (CARAFATE) 1 g tablet TAKE 1 TABLET BY MOUTH ONE HOUR BEFORE EACH MEAL AND TWO HOURS AFTER OTHER MEDICATIONS (Patient taking differently: Take 1 g by mouth See admin instructions. Take 1 tablet one hour before each meal and 2 hours after all other medications) 90 tablet 1   Facility-Administered Medications Prior to Visit  Medication Dose Route Frequency Provider Last Rate Last Admin   epirubicin (ELLENCE) 50 mg in sodium chloride 0.9 % bladder instillation  50 mg Bladder Instillation Once [name], MD       epirubicin (ELLENCE) 80 mg in sodium chloride (PF) 0.9 % bladder instillation  80 mg Bladder Instillation Once Minda Ditto, [facility]  gemcitabine (GEMZAR) chemo syringe for bladder instillation 2,000 mg  2,000 mg Bladder Instillation Once [name], MD       gemcitabine [facility]) chemo syringe for bladder instillation 2,000 mg  2,000 mg Bladder Instillation Once [name], MD        Review of Systems;  Patient denies headache, fevers, malaise, unintentional weight loss, skin rash, eye pain, sinus congestion and sinus pain, sore throat, dysphagia,  hemoptysis , cough, dyspnea, wheezing, chest pain, palpitations, orthopnea, edema, abdominal pain, nausea, melena, diarrhea, constipation, flank pain, dysuria, hematuria, urinary  Frequency, nocturia, numbness, tingling, seizures,  Focal weakness,  Loss of consciousness,  Tremor, insomnia, depression, anxiety, and suicidal ideation.      Objective:  BP 122/76 (BP Location: Left Arm, Patient Position: Sitting, Cuff Size: Normal)   Pulse 79   Temp 98 F (36.7 C) (Oral)   Ht '5\' 8"'$  (1.727 m)   Wt 115 lb 6.4 oz (52.3 kg)   SpO2 98%   BMI 17.55 kg/m   BP Readings from Last 3 Encounters:  11/19/21 122/76  11/18/21 (!) 160/92  11/04/21 (!) 151/97    Wt Readings from Last 3 Encounters:  11/19/21 115 lb 6.4 oz (52.3 kg)  11/18/21 115 lb (52.2 kg)  11/04/21 115 lb (52.2 kg)    General appearance: alert, cooperative and appears stated age Ears: normal TM's and external ear canals both ears Throat: lips, mucosa, and tongue normal; teeth and gums normal Neck: no adenopathy, no carotid bruit, supple, symmetrical, trachea midline and thyroid not enlarged, symmetric, no tenderness/mass/nodules Back: symmetric, no curvature. ROM normal. No CVA tenderness. Lungs: clear to auscultation bilaterally Heart: regular rate and rhythm, S1, S2 normal, no murmur, click, rub or gallop Abdomen: soft, non-tender; bowel sounds normal; no masses,  no organomegaly Pulses: 2+ and symmetric Skin: Skin color, texture, turgor normal. No rashes or lesions Lymph nodes: Cervical, supraclavicular, and axillary nodes normal.  Lab Results  Component Value Date   HGBA1C 4.6 12/27/2013   HGBA1C 5.7 (H) 08/31/2010    Lab Results  Component Value Date   CREATININE 0.71 10/26/2021   CREATININE 0.50 10/21/2021   CREATININE 0.53 03/05/2021    Lab Results  Component Value Date   WBC 6.1 10/26/2021   HGB 13.4 10/26/2021   HCT 40.5 10/26/2021   PLT 172 10/26/2021   GLUCOSE 117 (H) 10/26/2021   CHOL 120 01/11/2019   TRIG 76.0 01/11/2019   HDL 56.90 01/11/2019   LDLCALC 48 01/11/2019   ALT 25 12/09/2020   AST 33 12/09/2020   NA 134 (L) 10/26/2021   K 4.8 10/26/2021   CL 98 10/26/2021   CREATININE 0.71 10/26/2021   BUN 12 10/26/2021   CO2 27  10/26/2021   TSH 0.67 10/10/2016   INR 1.0 04/28/2014   HGBA1C 4.6 12/27/2013    No results found.  Assessment & Plan:   Problem List Items Addressed This Visit   None   I spent a total of   minutes with this patient in a face to face visit on the date of this encounter reviewing the last office visit with me on        ,  most recent with patient's cardiologist in    ,  patient'ss diet and eating habits, home blood pressure readings ,  most recent imaging study ,   and post visit ordering of testing and therapeutics.    Follow-up: No follow-ups on file.   [name], MD

## 2021-11-19 NOTE — Patient Instructions (Addendum)
Trial of amitriptyline starting at 50 mg daily, . You may increase the dose to 100 mg after one week if you do not think it helps.  If the 100 mg does not help after one week,  you can stop it .  It may be sedating, , so do not start above 50 mg dose   I am making a referral to [facility]  for  your rectal pain .   Consider acupuncture with Dr Francisca December  if [facility] can't help.  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

## 2021-11-20 NOTE — Assessment & Plan Note (Signed)
Placed in October 2020 by [facility] for lumbar radiculopathy due to spinal stenosis

## 2021-11-20 NOTE — Assessment & Plan Note (Signed)
She reports uncontrolled pain daily in her rectum.  She reports transient relief for 2 hours surrounding each dose of percocet.   She is taking gabapentin at night and  overusing motrin.  Will refer to [facility] for a second opinion on how best to manage her pain .  If they are unable to provide additional therapies,  I have recommended a trial of acupuncture, followed by a referral to Palliative Care

## 2021-11-21 ENCOUNTER — Emergency Department (HOSPITAL_COMMUNITY)
Admission: EM | Admit: 2021-11-21 | Discharge: 2021-11-21 | Discharge status: Against Medical Advice | Payer: Medicare Other | Attending: Emergency Medicine | Admitting: Emergency Medicine

## 2021-11-21 ENCOUNTER — Encounter: Payer: Self-pay | Admitting: Internal Medicine

## 2021-11-21 ENCOUNTER — Other Ambulatory Visit: Payer: Self-pay

## 2021-11-21 ENCOUNTER — Encounter (HOSPITAL_COMMUNITY): Payer: Self-pay | Admitting: Emergency Medicine

## 2021-11-21 ENCOUNTER — Emergency Department (HOSPITAL_COMMUNITY)
Admission: EM | Admit: 2021-11-21 | Discharge: 2021-11-22 | Disposition: A | Discharge status: Home / Self Care | Payer: Medicare Other | Source: Home / Self Care | Attending: Emergency Medicine | Admitting: Emergency Medicine

## 2021-11-21 ENCOUNTER — Encounter (HOSPITAL_COMMUNITY): Payer: Self-pay

## 2021-11-21 DIAGNOSIS — Z79899 Other long term (current) drug therapy: Secondary | ICD-10-CM | POA: Insufficient documentation

## 2021-11-21 DIAGNOSIS — J449 Chronic obstructive pulmonary disease, unspecified: Secondary | ICD-10-CM | POA: Insufficient documentation

## 2021-11-21 DIAGNOSIS — G8929 Other chronic pain: Secondary | ICD-10-CM | POA: Diagnosis not present

## 2021-11-21 DIAGNOSIS — Z5321 Procedure and treatment not carried out due to patient leaving prior to being seen by health care provider: Secondary | ICD-10-CM | POA: Insufficient documentation

## 2021-11-21 DIAGNOSIS — K6289 Other specified diseases of anus and rectum: Secondary | ICD-10-CM | POA: Insufficient documentation

## 2021-11-21 DIAGNOSIS — K760 Fatty (change of) liver, not elsewhere classified: Secondary | ICD-10-CM | POA: Diagnosis not present

## 2021-11-21 DIAGNOSIS — R791 Abnormal coagulation profile: Secondary | ICD-10-CM | POA: Insufficient documentation

## 2021-11-21 DIAGNOSIS — R109 Unspecified abdominal pain: Secondary | ICD-10-CM | POA: Diagnosis not present

## 2021-11-21 DIAGNOSIS — I700 Atherosclerosis of aorta: Secondary | ICD-10-CM | POA: Diagnosis not present

## 2021-11-21 DIAGNOSIS — I10 Essential (primary) hypertension: Secondary | ICD-10-CM | POA: Diagnosis not present

## 2021-11-21 MED ORDER — LIDOCAINE HCL URETHRAL/MUCOSAL 2 % EX GEL
1.0000 | Freq: Once | CUTANEOUS | Status: AC
  Administered 2021-11-22: 1
  Filled 2021-11-21: qty 11

## 2021-11-21 MED ORDER — OXYCODONE HCL 5 MG PO TABS
15.0000 mg | ORAL_TABLET | Freq: Once | ORAL | Status: AC
  Administered 2021-11-22: 15 mg via ORAL
  Filled 2021-11-21: qty 3

## 2021-11-21 NOTE — ED Notes (Signed)
Pt left ama due to long wait times.  

## 2021-11-21 NOTE — ED Triage Notes (Signed)
Patient has had rectal pain for over a year. She takes oxycodone for her chronic back pain and is still not getting relief for her rectal pain from the medication. No bleeding out of rectum.

## 2021-11-21 NOTE — ED Provider Notes (Signed)
[place] DEPT Provider Note   CSN: [id number] Arrival date & time: 11/21/21  2251     History {Add pertinent medical, surgical, social history, OB history to HPI:1} Chief Complaint  Patient presents with   Rectal Pain    [name] is a 81 y.o. female.  The history is provided by the patient, medical records and a relative.  [name] is a 81 y.o. female who presents to the Emergency Department complaining of *** 1.59yrrectal pain Daughter Initially wax and wane, now constant Saw GI - had colonscopy - had polyps, no cancer Goal: Find out source of pain - and treat pain Current oxy 5times daily 10/325 sees pain mgmt Opioid induced hyperalgesia.  Tried an opiod holiday (4wks, could not tolerate the pain).  Pain mgmt had nothing more to offer [facility]cp [facility]   No fever, vomiting Occ nausea  Hurting Normal BM.  Pain is internal.  Some pain with BM.      Home Medications Prior to Admission medications   Medication Sig Start Date End Date Taking? Authorizing Provider  acetaminophen (TYLENOL) 500 MG tablet Take 1,000 mg by mouth every 8 (eight) hours as needed for moderate pain.    [provider]  amitriptyline (ELAVIL) 50 MG tablet Take 1 tablet (50 mg total) by mouth at bedtime. 11/19/21   TCrecencio Mc MD  amLODipine (NORVASC) 5 MG tablet Take 1 tablet (5 mg total) by mouth daily. 10/15/21   KDeboraha Sprang MD  gabapentin (NEURONTIN) 300 MG capsule Take 2 capsules (600 mg total) by mouth 3 (three) times daily. 11/18/21 01/17/22  TCrecencio Mc MD  losartan (COZAAR) 100 MG tablet Take 1 tablet (100 mg total) by mouth at bedtime. 11/19/21   TCrecencio Mc MD  Menthol, Topical Analgesic, (BIOFREEZE EX) Apply 1 application topically 4 (four) times daily as needed (back pain).    [provider]  metoprolol succinate (TOPROL-XL) 25 MG 24 hr tablet TAKE 1 TABLET(25 MG) BY MOUTH DAILY Patient  taking differently: Take 25 mg by mouth daily. 02/25/21   TShirley Friar PA-C  oxyCODONE-acetaminophen (PERCOCET) 10-325 MG tablet Take 1 tablet by mouth every 4 (four) hours as needed for pain. 11/27/21 12/27/21  LGillis Santa MD  oxyCODONE-acetaminophen (PERCOCET) 10-325 MG tablet Take 1 tablet by mouth every 4 (four) hours as needed for pain. 12/27/21 01/26/22  LGillis Santa MD  oxyCODONE-acetaminophen (PERCOCET) 10-325 MG tablet Take 1 tablet by mouth every 4 (four) hours as needed for pain. 01/26/22 02/25/22  LGillis Santa MD  pantoprazole (PROTONIX) 40 MG tablet Take 40 mg by mouth at bedtime. 01/08/21   [provider]  polyethylene glycol (MIRALAX / GLYCOLAX) packet Take 17 g by mouth daily as needed for mild constipation.     [provider]  promethazine (PHENERGAN) 12.5 MG tablet TAKE 1 TABLET(12.5 MG) BY MOUTH EVERY 8 HOURS AS NEEDED FOR NAUSEA OR VOMITING Patient taking differently: Take 12.5 mg by mouth every 8 (eight) hours as needed for nausea or vomiting. 10/13/21   TCrecencio Mc MD  sucralfate (CARAFATE) 1 g tablet TAKE 1 TABLET BY MOUTH ONE HOUR BEFORE EACH MEAL AND TWO HOURS AFTER OTHER MEDICATIONS Patient taking differently: Take 1 g by mouth See admin instructions. Take 1 tablet one hour before each meal and 2 hours after all other medications 05/24/21   WKennyth Arnold FNP      Allergies    Patient  has no known allergies.    Review of Systems   Review of Systems  Physical Exam Updated Vital Signs BP (!) 162/110 (BP Location: Right Arm)   Pulse 85   Temp 98 F (36.7 C) (Oral)   Resp 18   SpO2 96%  Physical Exam  ED Results / Procedures / Treatments   Labs (all labs ordered are listed, but only abnormal results are displayed) Labs Reviewed - No data to display  EKG None  Radiology No results found.  Procedures Procedures  {Document cardiac monitor, telemetry assessment procedure when appropriate:1}  Medications Ordered in  ED Medications - No data to display  ED Course/ Medical Decision Making/ A&P                           Medical Decision Making  ***  {Document critical care time when appropriate:1} {Document review of labs and clinical decision tools ie heart score, Chads2Vasc2 etc:1}  {Document your independent review of radiology images, and any outside records:1} {Document your discussion with family members, caretakers, and with consultants:1} {Document social determinants of health affecting pt's care:1} {Document your decision making why or why not admission, treatments were needed:1} Final Clinical Impression(s) / ED Diagnoses Final diagnoses:  None    Rx / DC Orders ED Discharge Orders     None

## 2021-11-21 NOTE — ED Triage Notes (Signed)
Pt c/o rectal pain x 1 year, has been seen by PCP for same. Denies rectal bleeding, no changes to stool color/consistency.

## 2021-11-22 ENCOUNTER — Emergency Department (HOSPITAL_COMMUNITY): Payer: Medicare Other

## 2021-11-22 ENCOUNTER — Encounter (HOSPITAL_COMMUNITY): Payer: Self-pay

## 2021-11-22 DIAGNOSIS — I700 Atherosclerosis of aorta: Secondary | ICD-10-CM | POA: Diagnosis not present

## 2021-11-22 DIAGNOSIS — K760 Fatty (change of) liver, not elsewhere classified: Secondary | ICD-10-CM | POA: Diagnosis not present

## 2021-11-22 DIAGNOSIS — R109 Unspecified abdominal pain: Secondary | ICD-10-CM | POA: Diagnosis not present

## 2021-11-22 DIAGNOSIS — K6289 Other specified diseases of anus and rectum: Secondary | ICD-10-CM | POA: Diagnosis not present

## 2021-11-22 LAB — COMPREHENSIVE METABOLIC PANEL
ALT: 12 U/L (ref 0–44)
AST: 33 U/L (ref 15–41)
Albumin: 4.2 g/dL (ref 3.5–5.0)
Alkaline Phosphatase: 84 U/L (ref 38–126)
Anion gap: 9 (ref 5–15)
BUN: 13 mg/dL (ref 8–23)
CO2: 24 mmol/L (ref 22–32)
Calcium: 9.5 mg/dL (ref 8.9–10.3)
Chloride: 101 mmol/L (ref 98–111)
Creatinine, Ser: 0.45 mg/dL (ref 0.44–1.00)
GFR, Estimated: >60 mL/min (ref >60)
Glucose, Bld: 103 mg/dL — ABNORMAL HIGH (ref 70–99)
Potassium: 4.6 mmol/L (ref 3.5–5.1)
Sodium: 134 mmol/L — ABNORMAL LOW (ref 135–145)
Total Bilirubin: 0.7 mg/dL (ref 0.3–1.2)
Total Protein: 7 g/dL (ref 6.5–8.1)

## 2021-11-22 LAB — CBC WITH DIFFERENTIAL/PLATELET
Abs Immature Granulocytes: 0.02 10*3/uL (ref 0.00–0.07)
Basophils Absolute: 0.1 10*3/uL (ref 0.0–0.1)
Basophils Relative: 1 %
Eosinophils Absolute: 0.2 10*3/uL (ref 0.0–0.5)
Eosinophils Relative: 4 %
HCT: 41.5 % (ref 36.0–46.0)
Hemoglobin: 13.9 g/dL (ref 12.0–15.0)
Immature Granulocytes: 0 %
Lymphocytes Relative: 27 %
Lymphs Abs: 1.6 10*3/uL (ref 0.7–4.0)
MCH: 32.4 pg (ref 26.0–34.0)
MCHC: 33.5 g/dL (ref 30.0–36.0)
MCV: 96.7 fL (ref 80.0–100.0)
Monocytes Absolute: 0.6 10*3/uL (ref 0.1–1.0)
Monocytes Relative: 10 %
Neutro Abs: 3.5 10*3/uL (ref 1.7–7.7)
Neutrophils Relative %: 58 %
Platelets: 158 10*3/uL (ref 150–400)
RBC: 4.29 MIL/uL (ref 3.87–5.11)
RDW: 14.5 % (ref 11.5–15.5)
WBC: 6 10*3/uL (ref 4.0–10.5)
nRBC: 0 % (ref 0.0–0.2)

## 2021-11-22 LAB — PROTIME-INR
INR: 0.9 (ref 0.8–1.2)
Prothrombin Time: 12.2 seconds (ref 11.4–15.2)

## 2021-11-22 MED ORDER — IOHEXOL 300 MG/ML  SOLN
100.0000 mL | Freq: Once | INTRAMUSCULAR | Status: AC | PRN
  Administered 2021-11-22: 100 mL via INTRAVENOUS

## 2021-11-22 MED ORDER — OXYCODONE HCL 5 MG PO TABS
10.0000 mg | ORAL_TABLET | Freq: Once | ORAL | Status: AC
  Administered 2021-11-22: 10 mg via ORAL
  Filled 2021-11-22: qty 2

## 2021-11-22 NOTE — Discharge Instructions (Addendum)
The cause of your pain was not found today.  It is very important for you to continue follow up with Palliative Care, Pain Management.

## 2021-11-22 NOTE — ED Notes (Signed)
Pt incontinent of urine, pt cleaned and linen replaced.

## 2021-11-23 ENCOUNTER — Other Ambulatory Visit (HOSPITAL_COMMUNITY): Payer: Self-pay | Admitting: *Deleted

## 2021-11-23 ENCOUNTER — Other Ambulatory Visit: Payer: Self-pay | Admitting: Internal Medicine

## 2021-11-23 ENCOUNTER — Telehealth: Payer: Self-pay

## 2021-11-23 DIAGNOSIS — K6289 Other specified diseases of anus and rectum: Secondary | ICD-10-CM

## 2021-11-23 NOTE — Telephone Encounter (Signed)
FYI

## 2021-11-23 NOTE — Progress Notes (Addendum)
Anesthesia Review:  PCP:  Deborra Medina LOV 11/19/21 on chart   Cardiologist :DR Lowella Fairy 10/26/21  11/17/21- Device check  Device orders on chart on 11/25/21.  11/04/21- Generator Change out  Chest x-ray : EKG : 10/14/21  Echo : Stress test: Cardiac Cath :  Activity level:  Sleep Study/ CPAP : Fasting Blood Sugar :      / Checks Blood Sugar -- times a day:   Blood Thinner/ Instructions /Last Dose: ASA / Instructions/ Last Dose :   11/21/21- in ED for rectal pain  CBC CMp and PT done 11/22/21- in epic

## 2021-11-23 NOTE — Telephone Encounter (Signed)
[name] called from [facility] to state that unfortunately, they do not have any other treatment options to offer this patient.

## 2021-11-24 NOTE — Progress Notes (Signed)
SURGICAL WAITING ROOM VISITATION Patients having surgery or a procedure may have no more than 2 support people in the waiting area - these visitors may rotate.   Children under the age of 40 must have an adult with them who is not the patient. If the patient needs to stay at the hospital during part of their recovery, the visitor guidelines for inpatient rooms apply. Pre-op nurse will coordinate an appropriate time for 1 support person to accompany patient in pre-op.  This support person may not rotate.    Please refer to the [facility] website for the visitor guidelines for Inpatients (after your surgery is over and you are in a regular room).       Your procedure is scheduled on:        11/30/2021   Report to [facility] Main Entrance    Report to admitting at  0730 AM   Call this number if you have problems the morning of surgery [phone]   Do not eat food :After Midnight.   After Midnight you may have the following liquids until __ 0630____ AM/ DAY OF SURGERY  Water Non-Citrus Juices (without pulp, NO RED) Carbonated Beverages Black Coffee (NO MILK/CREAM OR CREAMERS, sugar ok)  Clear Tea (NO MILK/CREAM OR CREAMERS, sugar ok) regular and decaf                             Plain Jell-O (NO RED)                                           Fruit ices (not with fruit pulp, NO RED)                                     Popsicles (NO RED)                                                               Sports drinks like Gatorade (NO RED)                 Oral Hygiene is also important to reduce your risk of infection.                                    Remember - BRUSH YOUR TEETH THE MORNING OF SURGERY WITH YOUR REGULAR TOOTHPASTE   Do NOT smoke after Midnight   Take these medicines the morning of surgery with A SIP OF WATER:  amlodipine, toprol   DO NOT TAKE ANY ORAL DIABETIC MEDICATIONS DAY OF YOUR SURGERY  Bring CPAP mask and tubing day of surgery.                               You may not have any metal on your body including hair pins, jewelry, and body piercing             Do not wear make-up, lotions, powders, perfumes/cologne, or  deodorant  Do not wear nail polish including gel and S&S, artificial/acrylic nails, or any other type of covering on natural nails including finger and toenails. If you have artificial nails, gel coating, etc. that needs to be removed by a nail salon please have this removed prior to surgery or surgery may need to be canceled/ delayed if the surgeon/ anesthesia feels like they are unable to be safely monitored.   Do not shave  48 hours prior to surgery.               Men may shave face and neck.   Do not bring valuables to the hospital. [place].   Contacts, dentures or bridgework may not be worn into surgery.   Bring small overnight bag day of surgery.   DO NOT [place]. PHARMACY WILL DISPENSE MEDICATIONS LISTED ON YOUR MEDICATION LIST TO YOU DURING YOUR ADMISSION [place]!    Patients discharged on the day of surgery will not be allowed to drive home.  Someone NEEDS to stay with you for the first 24 hours after anesthesia.   Special Instructions: Bring a copy of your healthcare power of attorney and living will documents         the day of surgery if you haven't scanned them before.              Please read over the following fact sheets you were given: IF YOU HAVE QUESTIONS ABOUT YOUR PRE-OP INSTRUCTIONS PLEASE CALL [phone]     [facility] - Preparing for Surgery Before surgery, you can play an important role.  Because skin is not sterile, your skin needs to be as free of germs as possible.  You can reduce the number of germs on your skin by washing with CHG (chlorahexidine gluconate) soap before surgery.  CHG is an antiseptic cleaner which kills germs and bonds with the skin to continue killing germs even after  washing. Please DO NOT use if you have an allergy to CHG or antibacterial soaps.  If your skin becomes reddened/irritated stop using the CHG and inform your nurse when you arrive at Short Stay. Do not shave (including legs and underarms) for at least 48 hours prior to the first CHG shower.  You may shave your face/neck. Please follow these instructions carefully:  1.  Shower with CHG Soap the night before surgery and the  morning of Surgery.  2.  If you choose to wash your hair, wash your hair first as usual with your  normal  shampoo.  3.  After you shampoo, rinse your hair and body thoroughly to remove the  shampoo.                           4.  Use CHG as you would any other liquid soap.  You can apply chg directly  to the skin and wash                       Gently with a scrungie or clean washcloth.  5.  Apply the CHG Soap to your body ONLY FROM THE NECK DOWN.   Do not use on face/ open  Wound or open sores. Avoid contact with eyes, ears mouth and genitals (private parts).                       Wash face,  Genitals (private parts) with your normal soap.             6.  Wash thoroughly, paying special attention to the area where your surgery  will be performed.  7.  Thoroughly rinse your body with warm water from the neck down.  8.  DO NOT shower/wash with your normal soap after using and rinsing off  the CHG Soap.                9.  Pat yourself dry with a clean towel.            10.  Wear clean pajamas.            11.  Place clean sheets on your bed the night of your first shower and do not  sleep with pets. Day of Surgery : Do not apply any lotions/deodorants the morning of surgery.  Please wear clean clothes to the hospital/surgery center.  FAILURE TO FOLLOW THESE INSTRUCTIONS MAY RESULT IN THE CANCELLATION OF YOUR SURGERY PATIENT SIGNATURE_________________________________  NURSE  SIGNATURE__________________________________  ________________________________________________________________________

## 2021-11-24 NOTE — Telephone Encounter (Signed)
Spoke with pt and informed her of the message below.

## 2021-11-25 ENCOUNTER — Encounter (HOSPITAL_COMMUNITY): Payer: Self-pay

## 2021-11-25 ENCOUNTER — Encounter: Payer: Self-pay | Admitting: Internal Medicine

## 2021-11-25 ENCOUNTER — Other Ambulatory Visit: Payer: Self-pay

## 2021-11-25 ENCOUNTER — Encounter (HOSPITAL_COMMUNITY)
Admission: RE | Admit: 2021-11-25 | Discharge: 2021-11-25 | Disposition: A | Discharge status: Home / Self Care | Payer: Medicare Other | Source: Ambulatory Visit | Attending: Urology | Admitting: Urology

## 2021-11-25 DIAGNOSIS — K219 Gastro-esophageal reflux disease without esophagitis: Secondary | ICD-10-CM | POA: Insufficient documentation

## 2021-11-25 DIAGNOSIS — J439 Emphysema, unspecified: Secondary | ICD-10-CM | POA: Insufficient documentation

## 2021-11-25 DIAGNOSIS — K5730 Diverticulosis of large intestine without perforation or abscess without bleeding: Secondary | ICD-10-CM | POA: Diagnosis not present

## 2021-11-25 DIAGNOSIS — C679 Malignant neoplasm of bladder, unspecified: Secondary | ICD-10-CM | POA: Insufficient documentation

## 2021-11-25 DIAGNOSIS — Z01812 Encounter for preprocedural laboratory examination: Secondary | ICD-10-CM | POA: Diagnosis not present

## 2021-11-25 DIAGNOSIS — I5022 Chronic systolic (congestive) heart failure: Secondary | ICD-10-CM | POA: Insufficient documentation

## 2021-11-25 DIAGNOSIS — I110 Hypertensive heart disease with heart failure: Secondary | ICD-10-CM | POA: Insufficient documentation

## 2021-11-25 DIAGNOSIS — F17200 Nicotine dependence, unspecified, uncomplicated: Secondary | ICD-10-CM | POA: Diagnosis not present

## 2021-11-25 DIAGNOSIS — K760 Fatty (change of) liver, not elsewhere classified: Secondary | ICD-10-CM | POA: Diagnosis not present

## 2021-11-25 DIAGNOSIS — Z95810 Presence of automatic (implantable) cardiac defibrillator: Secondary | ICD-10-CM | POA: Diagnosis not present

## 2021-11-25 NOTE — Progress Notes (Signed)
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  Position during surgery:   Please send documentation back to:  [facility] (Fax # [phone])  Device Information:  Clinic EP Physician:  Virl Axe, MD   Device Type:  Defibrillator Manufacturer and Phone #:  [employer]: [phone] Pacemaker Dependent?:  No. Date of Last Device Check:  11/17/21 Normal Device Function?:  No.  Electrophysiologist's Recommendations:  Have magnet available. Provide continuous ECG monitoring when magnet is used or reprogramming is to be performed.  Procedure should not interfere with device function.  No device programming or magnet placement needed.  Per Device Clinic Standing Orders, Simone Curia, RN  10:48 AM 11/25/2021

## 2021-11-25 NOTE — Progress Notes (Addendum)
Blood pressure at preop on 11/25/21 was in right arm 157/104 and in oeft arm was 146/102,  pT has chronic back pain.  Denies any chest pain, dizziness, chest pain or shortness of breath.  PT given blood pressure readings from preop appt and instructed to inform DR Caryl Comes.  Pt states her blood pressure has been elevated recently due to pain.  PT states DR Caryl Comes just increased Amlodipine up to '5mg'$  in August.  PT aware to notify Dr Caryl Comes of blood pressure readings from preop appt.  [facility] aware.  NO new orders given

## 2021-11-26 NOTE — Progress Notes (Signed)
Anesthesia Chart Review   Case: [id number] Date/Time: 11/30/21 0915   Procedure: TRANSURETHRAL RESECTION OF BLADDER TUMOR (TURBT) WITH POST OPERATIVE INSTILLATION OF GEMCITABINE   Anesthesia type: General   Pre-op diagnosis: BLADDER CANCER OVERLAPPING SITES   Location: [place] / [facility]   Surgeons: [name], MD       DISCUSSION: 81 y.o. smoker with h/o HTN, LBBB, GERD, NICM, AICD in place (device orders in 11/25/2021 note), COPD, bladder cancer scheduled for above procedure 11/30/2021 with Dr. [name].   S/p ICD generator change out 11/04/2021.   Pt last seen by cardiology 10/14/2021. Pt euvolemic at this visit.  Amlodipine increased due to poorly controlled HTN. Elevated BP at PAT visit.  Advised to contact PCP.  Evaluate DOS.   Anticipate pt can proceed with planned procedure barring acute status change.   VS: BP (!) 157/104   Pulse 74   Temp 36.9 C (Oral)   Resp 16   Ht '5\' 8"'$  (1.727 m)   Wt 52.3 kg   SpO2 96%   BMI 17.53 kg/m   PROVIDERS: [name], MD  Virl Axe, MD is Cardiologist  LABS: Labs reviewed: Acceptable for surgery. (all labs ordered are listed, but only abnormal results are displayed)  Labs Reviewed - No data to display   IMAGES: CT Abdomen Pelvis 11/22/2021 IMPRESSION: 1. No acute intra-abdominal pathology identified. No definite radiographic explanation for the patient's reported symptoms. 2. Mild emphysema. 3. Mild hepatic steatosis. 4. Stable intra and extrahepatic biliary ductal dilation and dilation of the pancreatic duct to the level of the ampulla. A distal obstructing lesion is not clearly identified on this examination. Correlation with liver enzymes may be helpful to exclude an obstructing process, such as ampullary stenosis. 5. Moderate sigmoid diverticulosis without superimposed acute inflammatory change.  EKG:   CV: Echo 08/31/2018 1. The left ventricle has normal systolic function with an ejection   fraction of 60-65%. The cavity size was normal. Left ventricular diastolic  Doppler parameters are consistent with impaired relaxation.   2. The right ventricle has normal systolic function. The cavity was  normal. There is no increase in right ventricular wall thickness.Normal  RVSP   3. PVCs noted  Past Medical History:  Diagnosis Date   AICD (automatic cardioverter/defibrillator) present EP cardiologist--- dr Caryl Comes    placement 05-07-2014 , ef 25%,  NICM/  (02-11-2019 last echo 08-31-2018 ef 60-65%)   Arthritis    "in about all my joints; for sure in my back"   Bladder cancer [facility]) urologist-  dr Junious Silk   dx 1995--  recurrent bladder cancer 2015 , s/p TURBT's and chemo instillation's ;   04/ 2019  s/p TURBT   Chronic hyponatremia    Chronic low back pain with bilateral sciatica    s/p  spinal cord stimulator @ [facility]  [phone]   Chronic systolic (congestive) heart failure [facility])    cardiologist-  dr Rogue Jury   COPD with emphysema ([place])    (02-11-2019  per pt has never been on oxygen)   Coronary artery disease cardiologist-  dr Kathlyn [place]   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% per cardiac cath 03-03-2014   DDD (degenerative disc disease), thoracolumbar    Frequent urination    Full dentures    Gait instability    due to chronic low back pain, uses roller walker   GERD (gastroesophageal reflux disease)    History of iron deficiency anemia 11/2013   resolved w/ IV Iron  (02-11-2019  per pt has not had any issues since 2015)   History of stomach ulcers 11/2013   Hypertension    LBBB (left bundle branch block)    NICM (nonischemic cardiomyopathy) ([place]) last echo 12-31-2015 ef 55-60%   dx 09/ 2015 per echo 20%;  myoview 09/ 2015 ef 28%;  per cardiac cath 12/ 2015 ef 30%;     Nocturia more than twice per night    S/P insertion of spinal cord stimulator followed by [facility]---- Dr Pecola Leisure (notes in care everywhere)   10-09-2018  '@[facility]'$ --- thoracic spinal cord stimular/  generator  (device from [facility])---- per pt has a control   Scoliosis    Uses walker 02/18/2021   all the time   Wears glasses    for reading    Past Surgical History:  Procedure Laterality Date   BI-VENTRICULAR IMPLANTABLE CARDIOVERTER DEFIBRILLATOR N/A 05/07/2014   Procedure: BI-VENTRICULAR IMPLANTABLE CARDIOVERTER DEFIBRILLATOR  (CRT-D);  Surgeon: Deboraha Sprang, MD;  Location: [facility] CATH LAB;  Service: Cardiovascular;  Laterality: N/A;   BIV PACEMAKER GENERATOR CHANGE OUT     CARDIAC CATHETERIZATION  03-03-2014  dr Kathlyn [place]   [facility]   pLAD 20%, pRCA 20%, dRCA 50%, RPLS 50%;  ef 30%, mild elevated LVEDP, mild gradient across aortic valve LVOT   CARDIOVASCULAR STRESS TEST  11/25/2013   High risk nuclear study w/ large high severity inferior wall perfusion defect on stress and rest images, large mild severity anteroseptal wall perfusion defect on stress and rest images, No inducible ischemia/ global moderate hypokinesis, ef 28%   CATARACT EXTRACTION W/ INTRAOCULAR LENS  IMPLANT, BILATERAL Bilateral right 12-2013 / left  02-2014   colonscopy  11/2020   benign polyps removed   CYSTOSCOPY W/ RETROGRADES Bilateral 01/06/2015   Procedure: CYSTOSCOPY WITH  BLADDER BIOPSY BILATERAL RETROGRADE PYELOGRAM,INSTILLATION OF MITOMYCIN C;  Surgeon: [name], MD;  Location: [facility];  Service: Urology;  Laterality: Bilateral;   CYSTOSCOPY WITH BIOPSY N/A 10/06/2015   Procedure: CYSTO WITH BLADDER BIOPSY, FULGERATION, CHEMO IRRIGATION EPIRUBICIN IN PACU;  Surgeon: [name], MD;  Location: [facility];  Service: Urology;  Laterality: N/A;   CYSTOSCOPY WITH BIOPSY N/A 02/12/2019   Procedure: CYSTOSCOPY WITH BIOPSY/ FULGURATION/ INSTILLATION OF GEMCITABINE, bilateral retrograde turbt greater 5cm;  Surgeon: [name], MD;  Location: [facility];  Service: Urology;  Laterality: N/A;   CYSTOSCOPY WITH BIOPSY N/A 03/19/2021   Procedure: CYSTOSCOPY WITH TRANSURETHRAL RESECTION OF  BLADDER TUMOR  GREATER THAN 5CM WITH POST-OPERATIVE INSTILLATION OF EPIRUBICIN;  Surgeon: [name], MD;  Location: [facility];  Service: Urology;  Laterality: N/A;   CYSTOSCOPY WITH FULGERATION N/A 06/30/2017   Procedure: Marland Kitchen CYSTOSCOPY WITH [place] FULGERATION/ BLADDER BIOPSY/ INSTILLATION OF EPIRUBICIN;  Surgeon: [name], MD;  Location: [facility];  Service: Urology;  Laterality: N/A;   CYSTOSCOPY WITH RETROGRADE PYELOGRAM, URETEROSCOPY AND STENT PLACEMENT Bilateral 04/18/2014   Procedure: CYSTOSCOPY WITH RETROGRADE PYELOGRAM;  Surgeon: [name], MD;  Location: [facility];  Service: Urology;  Laterality: Bilateral;   ESOPHAGOGASTRODUODENOSCOPY N/A 11/28/2013   Procedure: ESOPHAGOGASTRODUODENOSCOPY (EGD);  Surgeon: Arta Silence, MD;  Location: [facility] ENDOSCOPY;  Service: Endoscopy;  Laterality: N/A;   ICD GENERATOR CHANGEOUT N/A 11/04/2021   Procedure: ICD GENERATOR CHANGEOUT;  Surgeon: Deboraha Sprang, MD;  Location: [place] CV LAB;  Service: Cardiovascular;  Laterality: N/A;   LUMBAR DISC SURGERY  1980's   "ruptured disc"   SPINAL CORD STIMULATOR IMPLANT  10-09-2018   '@[facility]'$   Thoracic spinal cord stimular/ genertor  (left flank)----- (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[employer])   TRANSTHORACIC ECHOCARDIOGRAM  12/31/2015   dr Caryl Comes   ef 40-814, grade 1 diastolic dysfunction/ mild MR/ septal motion showed abnormal function and dyssynergy   TRANSURETHRAL RESECTION OF BLADDER  1995   TRANSURETHRAL RESECTION OF BLADDER TUMOR N/A 12/13/2013   Procedure: TRANSURETHRAL RESECTION OF BLADDER TUMOR (TURBT);  Surgeon: [name], MD;  Location: [facility];  Service: Urology;  Laterality: N/A;   TRANSURETHRAL RESECTION OF BLADDER TUMOR N/A 01/17/2014   Procedure: TRANSURETHRAL RESECTION OF BLADDER TUMOR (TURBT);  Surgeon: [name], MD;  Location: [facility];  Service: Urology;  Laterality: N/A;   TRANSURETHRAL RESECTION OF BLADDER TUMOR N/A 04/18/2014   Procedure:  TRANSURETHRAL RESECTION OF BLADDER TUMOR (TURBT), CYSTOGRAM;  Surgeon: [name], MD;  Location: [facility];  Service: Urology;  Laterality: N/A;   TRANSURETHRAL RESECTION OF BLADDER TUMOR N/A 12/13/2019   Procedure: TRANSURETHRAL RESECTION OF BLADDER TUMOR (TURBT) GREATER THAN 5CM WITH CYSTOSCOPY/ POST OPERATIVE INSTILLATION OF GEMCITABINE;  Surgeon: [name], MD;  Location: [facility];  Service: Urology;  Laterality: N/A;   TRANSURETHRAL RESECTION OF BLADDER TUMOR N/A 10/06/2020   Procedure: TRANSURETHRAL RESECTION OF BLADDER TUMOR (TURBT)/ CYSTOSCOPY/  BLADDER BIOPSY;  Surgeon: [name], MD;  Location: [facility];  Service: Urology;  Laterality: N/A;    MEDICATIONS:  acetaminophen (TYLENOL) 500 MG tablet   amitriptyline (ELAVIL) 50 MG tablet   amLODipine (NORVASC) 5 MG tablet   gabapentin (NEURONTIN) 300 MG capsule   losartan (COZAAR) 100 MG tablet   Menthol, Topical Analgesic, (BIOFREEZE EX)   metoprolol succinate (TOPROL-XL) 25 MG 24 hr tablet   [START ON 11/27/2021] oxyCODONE-acetaminophen (PERCOCET) 10-325 MG tablet   [START ON 12/27/2021] oxyCODONE-acetaminophen (PERCOCET) 10-325 MG tablet   [START ON 01/26/2022] oxyCODONE-acetaminophen (PERCOCET) 10-325 MG tablet   pantoprazole (PROTONIX) 40 MG tablet   polyethylene glycol (MIRALAX / GLYCOLAX) packet   promethazine (PHENERGAN) 12.5 MG tablet   sucralfate (CARAFATE) 1 g tablet   No current facility-administered medications for this encounter.    epirubicin (ELLENCE) 50 mg in sodium chloride 0.9 % bladder instillation   epirubicin (ELLENCE) 80 mg in sodium chloride (PF) 0.9 % bladder instillation   gemcitabine (GEMZAR) chemo syringe for bladder instillation 2,000 mg   gemcitabine (GEMZAR) chemo syringe for bladder instillation 2,000 mg     Konrad Felix Ward, PA-C [facility]-Surgical [facility] [phone]

## 2021-11-29 ENCOUNTER — Encounter: Payer: Self-pay | Admitting: Internal Medicine

## 2021-11-29 ENCOUNTER — Other Ambulatory Visit: Payer: Medicare Other | Admitting: Student

## 2021-11-29 NOTE — Telephone Encounter (Addendum)
Pt daughter would like to be called regarding the pt. Pt daughter sent a my chart message

## 2021-11-30 ENCOUNTER — Ambulatory Visit (HOSPITAL_BASED_OUTPATIENT_CLINIC_OR_DEPARTMENT_OTHER): Payer: Medicare Other | Admitting: Certified Registered Nurse Anesthetist

## 2021-11-30 ENCOUNTER — Ambulatory Visit (HOSPITAL_COMMUNITY)
Admission: RE | Admit: 2021-11-30 | Discharge: 2021-11-30 | Disposition: A | Discharge status: Home / Self Care | Payer: Medicare Other | Source: Ambulatory Visit | Attending: Urology | Admitting: Urology

## 2021-11-30 ENCOUNTER — Encounter (HOSPITAL_COMMUNITY): Payer: Self-pay | Admitting: Urology

## 2021-11-30 ENCOUNTER — Ambulatory Visit (HOSPITAL_COMMUNITY): Payer: Medicare Other | Admitting: Physician Assistant

## 2021-11-30 ENCOUNTER — Encounter (HOSPITAL_COMMUNITY)
Admission: RE | Disposition: A | Discharge status: Home / Self Care | Payer: Self-pay | Source: Ambulatory Visit | Attending: Urology

## 2021-11-30 DIAGNOSIS — C678 Malignant neoplasm of overlapping sites of bladder: Secondary | ICD-10-CM | POA: Diagnosis not present

## 2021-11-30 DIAGNOSIS — Z79891 Long term (current) use of opiate analgesic: Secondary | ICD-10-CM | POA: Diagnosis not present

## 2021-11-30 DIAGNOSIS — G8929 Other chronic pain: Secondary | ICD-10-CM | POA: Diagnosis not present

## 2021-11-30 DIAGNOSIS — I739 Peripheral vascular disease, unspecified: Secondary | ICD-10-CM | POA: Diagnosis not present

## 2021-11-30 DIAGNOSIS — G709 Myoneural disorder, unspecified: Secondary | ICD-10-CM | POA: Insufficient documentation

## 2021-11-30 DIAGNOSIS — I110 Hypertensive heart disease with heart failure: Secondary | ICD-10-CM | POA: Insufficient documentation

## 2021-11-30 DIAGNOSIS — F17210 Nicotine dependence, cigarettes, uncomplicated: Secondary | ICD-10-CM | POA: Insufficient documentation

## 2021-11-30 DIAGNOSIS — I509 Heart failure, unspecified: Secondary | ICD-10-CM | POA: Diagnosis not present

## 2021-11-30 DIAGNOSIS — I5022 Chronic systolic (congestive) heart failure: Secondary | ICD-10-CM | POA: Diagnosis not present

## 2021-11-30 DIAGNOSIS — I2510 Atherosclerotic heart disease of native coronary artery without angina pectoris: Secondary | ICD-10-CM | POA: Diagnosis not present

## 2021-11-30 DIAGNOSIS — Z01818 Encounter for other preprocedural examination: Secondary | ICD-10-CM

## 2021-11-30 HISTORY — PX: TRANSURETHRAL RESECTION OF BLADDER TUMOR: SHX2575

## 2021-11-30 SURGERY — TURBT (TRANSURETHRAL RESECTION OF BLADDER TUMOR)
Anesthesia: General

## 2021-11-30 MED ORDER — LACTATED RINGERS IV SOLN: INTRAVENOUS | Status: DC

## 2021-11-30 MED ORDER — ORAL CARE MOUTH RINSE: 15.0000 mL | Freq: Once | OROMUCOSAL | Status: AC

## 2021-11-30 MED ORDER — LIDOCAINE HCL (PF) 2 % IJ SOLN
INTRAMUSCULAR | Status: AC
  Filled 2021-11-30: qty 5

## 2021-11-30 MED ORDER — CEFAZOLIN SODIUM-DEXTROSE 2-4 GM/100ML-% IV SOLN
2.0000 g | Freq: Once | INTRAVENOUS | Status: AC
  Administered 2021-11-30: 2 g via INTRAVENOUS
  Filled 2021-11-30: qty 100

## 2021-11-30 MED ORDER — ACETAMINOPHEN 10 MG/ML IV SOLN: 1000.0000 mg | Freq: Once | INTRAVENOUS | Status: DC | PRN

## 2021-11-30 MED ORDER — PHENYLEPHRINE HCL (PRESSORS) 10 MG/ML IV SOLN
INTRAVENOUS | Status: DC | PRN
  Administered 2021-11-30: 80 ug via INTRAVENOUS

## 2021-11-30 MED ORDER — CHLORHEXIDINE GLUCONATE 0.12 % MT SOLN
15.0000 mL | Freq: Once | OROMUCOSAL | Status: AC
  Administered 2021-11-30: 15 mL via OROMUCOSAL

## 2021-11-30 MED ORDER — ONDANSETRON HCL 4 MG/2ML IJ SOLN
INTRAMUSCULAR | Status: AC
  Filled 2021-11-30: qty 2

## 2021-11-30 MED ORDER — FENTANYL CITRATE PF 50 MCG/ML IJ SOSY: 25.0000 ug | PREFILLED_SYRINGE | INTRAMUSCULAR | Status: DC | PRN

## 2021-11-30 MED ORDER — PHENYLEPHRINE HCL-NACL 20-0.9 MG/250ML-% IV SOLN
INTRAVENOUS | Status: DC | PRN
  Administered 2021-11-30: 35 ug/min via INTRAVENOUS

## 2021-11-30 MED ORDER — FENTANYL CITRATE (PF) 100 MCG/2ML IJ SOLN
INTRAMUSCULAR | Status: AC
  Filled 2021-11-30: qty 2

## 2021-11-30 MED ORDER — PROPOFOL 10 MG/ML IV BOLUS
INTRAVENOUS | Status: AC
  Filled 2021-11-30: qty 20

## 2021-11-30 MED ORDER — FENTANYL CITRATE (PF) 100 MCG/2ML IJ SOLN
INTRAMUSCULAR | Status: DC | PRN
  Administered 2021-11-30 (×2): 25 ug via INTRAVENOUS
  Administered 2021-11-30: 50 ug via INTRAVENOUS

## 2021-11-30 MED ORDER — SODIUM CHLORIDE 0.9 % IR SOLN
Status: DC | PRN
  Administered 2021-11-30 (×5): 3000 mL via INTRAVESICAL

## 2021-11-30 MED ORDER — DEXAMETHASONE SODIUM PHOSPHATE 10 MG/ML IJ SOLN
INTRAMUSCULAR | Status: DC | PRN
  Administered 2021-11-30: 5 mg via INTRAVENOUS

## 2021-11-30 MED ORDER — LIDOCAINE 2% (20 MG/ML) 5 ML SYRINGE
INTRAMUSCULAR | Status: DC | PRN
  Administered 2021-11-30: 60 mg via INTRAVENOUS

## 2021-11-30 MED ORDER — GEMCITABINE CHEMO FOR BLADDER INSTILLATION 2000 MG
2000.0000 mg | Freq: Once | INTRAVENOUS | Status: AC
  Administered 2021-11-30: 2000 mg via INTRAVESICAL
  Filled 2021-11-30: qty 2000

## 2021-11-30 MED ORDER — ONDANSETRON HCL 4 MG/2ML IJ SOLN
INTRAMUSCULAR | Status: DC | PRN
  Administered 2021-11-30: 4 mg via INTRAVENOUS

## 2021-11-30 MED ORDER — PROPOFOL 10 MG/ML IV BOLUS
INTRAVENOUS | Status: DC | PRN
  Administered 2021-11-30: 80 mg via INTRAVENOUS

## 2021-11-30 MED ORDER — DEXAMETHASONE SODIUM PHOSPHATE 10 MG/ML IJ SOLN
INTRAMUSCULAR | Status: AC
  Filled 2021-11-30: qty 1

## 2021-11-30 MED ORDER — 0.9 % SODIUM CHLORIDE (POUR BTL) OPTIME
TOPICAL | Status: DC | PRN
  Administered 2021-11-30: 1000 mL

## 2021-11-30 MED ORDER — ONDANSETRON HCL 4 MG/2ML IJ SOLN: 4.0000 mg | Freq: Once | INTRAMUSCULAR | Status: DC | PRN

## 2021-11-30 MED ORDER — AMISULPRIDE (ANTIEMETIC) 5 MG/2ML IV SOLN: 10.0000 mg | Freq: Once | INTRAVENOUS | Status: DC | PRN

## 2021-11-30 SURGICAL SUPPLY — 15 items
BAG DRN RND TRDRP ANRFLXCHMBR (UROLOGICAL SUPPLIES)
BAG URINE DRAIN 2000ML AR STRL (UROLOGICAL SUPPLIES) IMPLANT
BAG URO CATCHER STRL LF (MISCELLANEOUS) ×2 IMPLANT
CATH FOLEY 2WAY SLVR  5CC 16FR (CATHETERS) ×1
CATH FOLEY 2WAY SLVR 5CC 16FR (CATHETERS) IMPLANT
DRAPE FOOT SWITCH (DRAPES) ×2 IMPLANT
GLOVE SURG LX STRL 7.5 STRW (GLOVE) ×2 IMPLANT
GOWN STRL REUS W/ TWL XL LVL3 (GOWN DISPOSABLE) ×2 IMPLANT
GOWN STRL REUS W/TWL XL LVL3 (GOWN DISPOSABLE) ×1
KIT TURNOVER KIT A (KITS) IMPLANT
LOOP CUT BIPOLAR 24F LRG (ELECTROSURGICAL) IMPLANT
MANIFOLD NEPTUNE II (INSTRUMENTS) ×2 IMPLANT
PACK CYSTO (CUSTOM PROCEDURE TRAY) ×2 IMPLANT
TUBING CONNECTING 10 (TUBING) ×2 IMPLANT
TUBING UROLOGY SET (TUBING) ×2 IMPLANT

## 2021-11-30 NOTE — Op Note (Signed)
Preoperative diagnosis: Bladder cancer overlapping sites Postoperative diagnosis: Same  Procedure: TURBT greater than 5 cm, postoperative instillation of gemcitabine in PACU  Surgeon: Junious Silk  Anesthesia: General  Indication for procedure: [name] is an 81 year old female with recurrent superficial bladder tumor.  She had recurrent tumor on recent office cystoscopy.  Findings: On exam under anesthesia the vulva appeared normal.  The bladder and urethra were palpably normal without masses on bimanual exam.  On cystoscopy there was a posterior patch and scattering of tumors about 6 cm.  Clinically there were superficial except 2 or 3 tumors in the middle of this patch slightly deeper and more broad-based.  There was a patch of 2 or 3 tumors toward the right and a patch of 2 or 3 tumors toward the left dome.  Bladder wall is very thin and large capacity and I would not suspect there would be muscle in the specimen given the high risk of perforation.  Also she has had multiple resections posteriorly with scar tissue as well.  Description of procedure: After consent was obtained the patient was brought to the operating room.  After adequate seizures she was placed in lithotomy position prepped and draped in the usual fashion.  A timeout was formed to cover the patient and procedure.  The cystoscope was passed per urethra the bladder carefully inspected.  I then swapped out the continuous-flow sheath and then the loop and handle.  I started at the left dome and resected those tumors with ablation and fulguration.  Similar on the right dome and then worked along the posterior tumors.  All visible tumor was resected.  I was not concerned about perforation.  Hemostasis was excellent at low pressure.  All the chips were evacuated and sent to pathology together.  The scope was removed.  I examined her urethra and bladder.  A 16 French Foley catheter was placed and left to gravity drainage with clear urine.  She  was awakened taken recovery room in stable condition.  Instillation of gemcitabine in PACU: 2000 mg of gemcitabine was instilled per urethra left indwelling in the bladder for 50 minutes and then drained.  Complications: None  Blood loss: Minimal  Specimens to pathology: Posterior bladder tumor  Drains: 16 French Foley  Disposition: Patient stable to PACU

## 2021-11-30 NOTE — Anesthesia Procedure Notes (Signed)
Procedure Name: LMA Insertion Date/Time: 11/30/2021 9:42 AM  Performed by: Maxwell Caul, CRNAPre-anesthesia Checklist: Patient identified, Emergency Drugs available and Suction available Patient Re-evaluated:Patient Re-evaluated prior to induction Oxygen Delivery Method: Circle system utilized Preoxygenation: Pre-oxygenation with 100% oxygen Induction Type: IV induction LMA: LMA with gastric port inserted LMA Size: 4.0 Number of attempts: 1 Placement Confirmation: positive ETCO2 and breath sounds checked- equal and bilateral Tube secured with: Tape Dental Injury: Teeth and Oropharynx as per pre-operative assessment

## 2021-11-30 NOTE — H&P (Signed)
H&P  Chief Complaint: Bladder cancer  History of Present Illness: [name] is an 81 year old female with a history of bladder cancer with multiple recurrences of low-grade and high-grade TA disease since 2015.  She had a prior remote history of bladder cancer in 1995.  Recent office cystoscopy revealed several small papillary recurrences and she was brought today for cystoscopy, bladder biopsy/TURBT with postoperative instillation of gemcitabine.  She has been well without dysuria or gross hematuria.  No cough or congestion or fever.  She does have chronic low back pain and now she reports rectal pain.  She underwent a CT scan of the abdomen and pelvis November 22, 2021 which was benign.  Normal upper tracts on delayed imaging.  I do think I can see one of the posterior tumors on the contrasted images.  Past Medical History:  Diagnosis Date   AICD (automatic cardioverter/defibrillator) present EP cardiologist--- dr Caryl Comes    placement 05-07-2014 , ef 25%,  NICM/  (02-11-2019 last echo 08-31-2018 ef 60-65%)   Arthritis    "in about all my joints; for sure in my back"   Bladder cancer [facility]) urologist-  dr Junious Silk   dx 1995--  recurrent bladder cancer 2015 , s/p TURBT's and chemo instillation's ;   04/ 2019  s/p TURBT   Chronic hyponatremia    Chronic low back pain with bilateral sciatica    s/p  spinal cord stimulator @ [facility]  [phone]   Chronic systolic (congestive) heart failure [facility])    cardiologist-  dr Rogue Jury   COPD with emphysema ([place])    (02-11-2019  per pt has never been on oxygen)   Coronary artery disease cardiologist-  dr Kathlyn [place]   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% per cardiac cath 03-03-2014   DDD (degenerative disc disease), thoracolumbar    Frequent urination    Full dentures    Gait instability    due to chronic low back pain, uses roller walker   GERD (gastroesophageal reflux disease)    History of iron deficiency anemia 11/2013   resolved w/ IV Iron  (02-11-2019  per  pt has not had any issues since 2015)   History of stomach ulcers 11/2013   Hypertension    LBBB (left bundle branch block)    NICM (nonischemic cardiomyopathy) ([place]) last echo 12-31-2015 ef 55-60%   dx 09/ 2015 per echo 20%;  myoview 09/ 2015 ef 28%;  per cardiac cath 12/ 2015 ef 30%;     Nocturia more than twice per night    S/P insertion of spinal cord stimulator followed by [facility]---- Dr Pecola Leisure (notes in care everywhere)   10-09-2018  '@[facility]'$ --- thoracic spinal cord stimular/ generator  (device from [facility])---- per pt has a control   Scoliosis    Uses walker 02/18/2021   all the time   Wears glasses    for reading   Past Surgical History:  Procedure Laterality Date   BI-VENTRICULAR IMPLANTABLE CARDIOVERTER DEFIBRILLATOR N/A 05/07/2014   Procedure: BI-VENTRICULAR IMPLANTABLE CARDIOVERTER DEFIBRILLATOR  (CRT-D);  Surgeon: Deboraha Sprang, MD;  Location: [facility] CATH LAB;  Service: Cardiovascular;  Laterality: N/A;   BIV PACEMAKER GENERATOR CHANGE OUT     CARDIAC CATHETERIZATION  03-03-2014  dr Kathlyn [place]   [facility]   pLAD 20%, pRCA 20%, dRCA 50%, RPLS 50%;  ef 30%, mild elevated LVEDP, mild gradient across aortic valve LVOT   CARDIOVASCULAR STRESS TEST  11/25/2013   High risk nuclear study w/ large high severity inferior wall  perfusion defect on stress and rest images, large mild severity anteroseptal wall perfusion defect on stress and rest images, No inducible ischemia/ global moderate hypokinesis, ef 28%   CATARACT EXTRACTION W/ INTRAOCULAR LENS  IMPLANT, BILATERAL Bilateral right 12-2013 / left  02-2014   colonscopy  11/2020   benign polyps removed   CYSTOSCOPY W/ RETROGRADES Bilateral 01/06/2015   Procedure: CYSTOSCOPY WITH  BLADDER BIOPSY BILATERAL RETROGRADE PYELOGRAM,INSTILLATION OF MITOMYCIN C;  Surgeon: [name], MD;  Location: [facility];  Service: Urology;  Laterality: Bilateral;   CYSTOSCOPY WITH BIOPSY N/A 10/06/2015   Procedure: CYSTO WITH BLADDER BIOPSY,  FULGERATION, CHEMO IRRIGATION EPIRUBICIN IN PACU;  Surgeon: [name], MD;  Location: [facility];  Service: Urology;  Laterality: N/A;   CYSTOSCOPY WITH BIOPSY N/A 02/12/2019   Procedure: CYSTOSCOPY WITH BIOPSY/ FULGURATION/ INSTILLATION OF GEMCITABINE, bilateral retrograde turbt greater 5cm;  Surgeon: [name], MD;  Location: [facility];  Service: Urology;  Laterality: N/A;   CYSTOSCOPY WITH BIOPSY N/A 03/19/2021   Procedure: CYSTOSCOPY WITH TRANSURETHRAL RESECTION OF BLADDER TUMOR  GREATER THAN 5CM WITH POST-OPERATIVE INSTILLATION OF EPIRUBICIN;  Surgeon: [name], MD;  Location: [facility];  Service: Urology;  Laterality: N/A;   CYSTOSCOPY WITH FULGERATION N/A 06/30/2017   Procedure: Marland Kitchen CYSTOSCOPY WITH [place] FULGERATION/ BLADDER BIOPSY/ INSTILLATION OF EPIRUBICIN;  Surgeon: [name], MD;  Location: [facility];  Service: Urology;  Laterality: N/A;   CYSTOSCOPY WITH RETROGRADE PYELOGRAM, URETEROSCOPY AND STENT PLACEMENT Bilateral 04/18/2014   Procedure: CYSTOSCOPY WITH RETROGRADE PYELOGRAM;  Surgeon: [name], MD;  Location: [facility];  Service: Urology;  Laterality: Bilateral;   ESOPHAGOGASTRODUODENOSCOPY N/A 11/28/2013   Procedure: ESOPHAGOGASTRODUODENOSCOPY (EGD);  Surgeon: Arta Silence, MD;  Location: [facility] ENDOSCOPY;  Service: Endoscopy;  Laterality: N/A;   ICD GENERATOR CHANGEOUT N/A 11/04/2021   Procedure: ICD GENERATOR CHANGEOUT;  Surgeon: Deboraha Sprang, MD;  Location: [place] CV LAB;  Service: Cardiovascular;  Laterality: N/A;   LUMBAR DISC SURGERY  1980's   "ruptured disc"   SPINAL CORD STIMULATOR IMPLANT  10-09-2018   '@[facility]'$    Thoracic spinal cord stimular/ genertor  (left flank)----- (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[employer])   TRANSTHORACIC ECHOCARDIOGRAM  12/31/2015   dr Caryl Comes   ef 22-297, grade 1 diastolic dysfunction/ mild MR/ septal motion showed abnormal function and dyssynergy   TRANSURETHRAL RESECTION OF BLADDER   1995   TRANSURETHRAL RESECTION OF BLADDER TUMOR N/A 12/13/2013   Procedure: TRANSURETHRAL RESECTION OF BLADDER TUMOR (TURBT);  Surgeon: [name], MD;  Location: [facility];  Service: Urology;  Laterality: N/A;   TRANSURETHRAL RESECTION OF BLADDER TUMOR N/A 01/17/2014   Procedure: TRANSURETHRAL RESECTION OF BLADDER TUMOR (TURBT);  Surgeon: [name], MD;  Location: [facility];  Service: Urology;  Laterality: N/A;   TRANSURETHRAL RESECTION OF BLADDER TUMOR N/A 04/18/2014   Procedure: TRANSURETHRAL RESECTION OF BLADDER TUMOR (TURBT), CYSTOGRAM;  Surgeon: [name], MD;  Location: [facility];  Service: Urology;  Laterality: N/A;   TRANSURETHRAL RESECTION OF BLADDER TUMOR N/A 12/13/2019   Procedure: TRANSURETHRAL RESECTION OF BLADDER TUMOR (TURBT) GREATER THAN 5CM WITH CYSTOSCOPY/ POST OPERATIVE INSTILLATION OF GEMCITABINE;  Surgeon: [name], MD;  Location: [facility];  Service: Urology;  Laterality: N/A;   TRANSURETHRAL RESECTION OF BLADDER TUMOR N/A 10/06/2020   Procedure: TRANSURETHRAL RESECTION OF BLADDER TUMOR (TURBT)/ CYSTOSCOPY/  BLADDER BIOPSY;  Surgeon: [name], MD;  Location: [facility];  Service: Urology;  Laterality: N/A;    Home Medications:  Medications Prior to Admission  Medication Sig  Dispense Refill Last Dose   acetaminophen (TYLENOL) 500 MG tablet Take 1,000 mg by mouth every 8 (eight) hours as needed for moderate pain.   11/29/2021   amitriptyline (ELAVIL) 50 MG tablet Take 1 tablet (50 mg total) by mouth at bedtime. 90 tablet 1 11/29/2021   amLODipine (NORVASC) 5 MG tablet Take 1 tablet (5 mg total) by mouth daily. 90 tablet 2 11/30/2021 at 0530   gabapentin (NEURONTIN) 300 MG capsule Take 2 capsules (600 mg total) by mouth 3 (three) times daily. (Patient taking differently: Take 900-1,200 mg by mouth at bedtime.) 270 capsule 1 11/29/2021   losartan (COZAAR) 100 MG tablet Take 1 tablet (100 mg total) by mouth at bedtime. 90 tablet 1 11/29/2021    Menthol, Topical Analgesic, (BIOFREEZE EX) Apply 1 application topically 4 (four) times daily as needed (back pain).   Past Month   metoprolol succinate (TOPROL-XL) 25 MG 24 hr tablet TAKE 1 TABLET(25 MG) BY MOUTH DAILY (Patient taking differently: Take 25 mg by mouth daily.) 90 tablet 3 11/30/2021 at 0530   oxyCODONE-acetaminophen (PERCOCET) 10-325 MG tablet Take 1 tablet by mouth every 4 (four) hours as needed for pain. 150 tablet 0 11/30/2021 at 0600   pantoprazole (PROTONIX) 40 MG tablet Take 40 mg by mouth at bedtime.   11/29/2021   polyethylene glycol (MIRALAX / GLYCOLAX) packet Take 17 g by mouth daily as needed for mild constipation.    11/29/2021   promethazine (PHENERGAN) 12.5 MG tablet TAKE 1 TABLET(12.5 MG) BY MOUTH EVERY 8 HOURS AS NEEDED FOR NAUSEA OR VOMITING (Patient taking differently: Take 12.5 mg by mouth every 8 (eight) hours as needed for nausea or vomiting.) 30 tablet 2 11/29/2021   sucralfate (CARAFATE) 1 g tablet TAKE 1 TABLET BY MOUTH ONE HOUR BEFORE EACH MEAL AND TWO HOURS AFTER OTHER MEDICATIONS (Patient taking differently: Take 1 g by mouth See admin instructions. Take 1 tablet one hour before each meal and 2 hours after all other medications) 90 tablet 1 Past Month   [START ON 12/27/2021] oxyCODONE-acetaminophen (PERCOCET) 10-325 MG tablet Take 1 tablet by mouth every 4 (four) hours as needed for pain. 585 tablet 0 duplicate   [START ON 27/09/8240] oxyCODONE-acetaminophen (PERCOCET) 10-325 MG tablet Take 1 tablet by mouth every 4 (four) hours as needed for pain. 353 tablet 0 duplicate   Allergies: No Known Allergies  Family History  Problem Relation Age of Onset   Cancer Mother    Cancer Brother    Social History:  reports that she has been smoking cigarettes. She has a 28.00 pack-year smoking history. She has never used smokeless tobacco. She reports that she does not drink alcohol and does not use drugs.  ROS: A complete review of systems was performed.  All systems are  negative except for pertinent findings as noted. Review of Systems  Musculoskeletal:  Positive for back pain.  All other systems reviewed and are negative.    Physical Exam:  Vital signs in last 24 hours: Temp:  [97.9 F (36.6 C)] 97.9 F (36.6 C) (09/12 0730) Resp:  [16] 16 (09/12 0730) BP: (113)/(85) 113/85 (09/12 0730) SpO2:  [97 %] 97 % (09/12 0730) Weight:  [52.3 kg] 52.3 kg (09/12 0730) General:  Alert and oriented, No acute distress HEENT: Normocephalic, atraumatic Cardiovascular: Regular rate and rhythm Lungs: Regular rate and effort Abdomen: Soft, nontender, nondistended, no abdominal masses Back: No CVA tenderness Extremities: No edema Neurologic: Grossly intact  Laboratory Data:  No results found for this or  any previous visit (from the past 24 hour(s)). No results found for this or any previous visit (from the past 240 hour(s)). Creatinine: No results for input(s): "CREATININE" in the last 168 hours.  Impression/Assessment:  Recurrent bladder cancer-  Plan:  I discussed with the patient the nature, potential benefits, risks and alternatives to cystoscopy, bladder biopsy/TURBT, postoperative instillation of gemcitabine including side effects of the proposed treatment, the likelihood of the patient achieving the goals of the procedure, and any potential problems that might occur during the procedure or recuperation.  We discussed she may need a Foley catheter postop.  She also asked about possible referral back to oncology and consideration of "Keytruda".  She states she has been watching the commercials and knows there are some "serious side effects".  All questions answered. Patient elects to proceed.   [name] 11/30/2021, 8:05 AM

## 2021-11-30 NOTE — Anesthesia Preprocedure Evaluation (Addendum)
Anesthesia Evaluation  Patient identified by MRN, date of birth, ID band Patient awake    Reviewed: Allergy & Precautions, NPO status , Patient's Chart, lab work & pertinent test results  Airway Mallampati: I  TM Distance: >3 FB Neck ROM: Full    Dental  (+) Edentulous Upper, Edentulous Lower   Pulmonary COPD, Current Smoker and Patient abstained from smoking.,    Pulmonary exam normal        Cardiovascular hypertension, Pt. on medications and Pt. on home beta blockers + CAD, + Peripheral Vascular Disease and +CHF  Normal cardiovascular exam+ Cardiac Defibrillator      Neuro/Psych  Neuromuscular disease negative psych ROS   GI/Hepatic GERD  Medicated and Controlled,(+)     substance abuse  ,   Endo/Other  negative endocrine ROS  Renal/GU negative Renal ROS     Musculoskeletal  (+) Arthritis , narcotic dependentChronic low back pain with bilateral sciatica   Abdominal   Peds  Hematology negative hematology ROS (+)   Anesthesia Other Findings BLADDER CANCER OVERLAPPING SITES  Reproductive/Obstetrics                            Anesthesia Physical Anesthesia Plan  ASA: 3  Anesthesia Plan: General   Post-op Pain Management:    Induction: Intravenous  PONV Risk Score and Plan: 2 and Ondansetron, Dexamethasone and Treatment may vary due to age or medical condition  Airway Management Planned: LMA  Additional Equipment:   Intra-op Plan:   Post-operative Plan: Extubation in OR  Informed Consent: I have reviewed the patients History and Physical, chart, labs and discussed the procedure including the risks, benefits and alternatives for the proposed anesthesia with the patient or authorized representative who has indicated his/her understanding and acceptance.       Plan Discussed with: CRNA  Anesthesia Plan Comments:       Anesthesia Quick Evaluation

## 2021-11-30 NOTE — Transfer of Care (Signed)
Immediate Anesthesia Transfer of Care Note  Patient: [name]  Procedure(s) Performed: TRANSURETHRAL RESECTION OF BLADDER TUMOR (TURBT) WITH POST OPERATIVE INSTILLATION OF GEMCITABINE  Patient Location: PACU  Anesthesia Type:General  Level of Consciousness: drowsy  Airway & Oxygen Therapy: Patient Spontanous Breathing and Patient connected to face mask oxygen  Post-op Assessment: Report given to RN and Post -op Vital signs reviewed and stable  Post vital signs: Reviewed and stable  Last Vitals:  Vitals Value Taken Time  BP    Temp    Pulse    Resp    SpO2      Last Pain:  Vitals:   11/30/21 0748  TempSrc:   PainSc: 0-No pain         Complications: No notable events documented.

## 2021-11-30 NOTE — Anesthesia Postprocedure Evaluation (Signed)
Anesthesia Post Note  Patient: [name]  Procedure(s) Performed: TRANSURETHRAL RESECTION OF BLADDER TUMOR (TURBT) WITH POST OPERATIVE INSTILLATION OF GEMCITABINE     Patient location during evaluation: PACU Anesthesia Type: General Level of consciousness: awake Pain management: pain level controlled Vital Signs Assessment: post-procedure vital signs reviewed and stable Respiratory status: spontaneous breathing, nonlabored ventilation, respiratory function stable and patient connected to nasal cannula oxygen Cardiovascular status: blood pressure returned to baseline and stable Postop Assessment: no apparent nausea or vomiting Anesthetic complications: no   No notable events documented.  Last Vitals:  Vitals:   11/30/21 1300 11/30/21 1330  BP: (!) 137/105 (!) 141/106  Pulse: 73 82  Resp: 18   Temp:    SpO2: 90% 95%    Last Pain:  Vitals:   11/30/21 1330  TempSrc:   PainSc: 0-No pain                 Brennen Camper P Macee Venables

## 2021-12-01 ENCOUNTER — Encounter (HOSPITAL_COMMUNITY): Payer: Self-pay | Admitting: Urology

## 2021-12-01 LAB — SURGICAL PATHOLOGY

## 2021-12-01 NOTE — Telephone Encounter (Signed)
Patient's daughter called and said that Dr Francisca December is no longer doing acupuncture. Does Dr Derrel Nip know anyone else that does acupuncture?

## 2021-12-03 ENCOUNTER — Other Ambulatory Visit: Payer: Medicare Other | Admitting: Student

## 2021-12-03 DIAGNOSIS — G8929 Other chronic pain: Secondary | ICD-10-CM | POA: Diagnosis not present

## 2021-12-03 DIAGNOSIS — M549 Dorsalgia, unspecified: Secondary | ICD-10-CM | POA: Diagnosis not present

## 2021-12-03 DIAGNOSIS — R630 Anorexia: Secondary | ICD-10-CM

## 2021-12-03 DIAGNOSIS — K6289 Other specified diseases of anus and rectum: Secondary | ICD-10-CM | POA: Diagnosis not present

## 2021-12-03 DIAGNOSIS — K5900 Constipation, unspecified: Secondary | ICD-10-CM

## 2021-12-03 DIAGNOSIS — C678 Malignant neoplasm of overlapping sites of bladder: Secondary | ICD-10-CM

## 2021-12-03 DIAGNOSIS — Z515 Encounter for palliative care: Secondary | ICD-10-CM

## 2021-12-03 NOTE — Progress Notes (Signed)
[facility] Palliative Care Consult Note Telephone: [phone]  Fax: [phone]   Date of encounter: 12/03/21 1:56 PM PATIENT NAME: [name] [place] [address]   [phone] (home) [phone] (work) DOB: October 14, 1940 MRN: [id number] PRIMARY CARE PROVIDER:    [name], MD,  [place] [address] [phone]  REFERRING PROVIDER:   [name], MD [address],  [place] [address] [phone]  RESPONSIBLE PARTY:    Contact Information     Name Relation Home Work [place] Daughter [phone]  [phone]   Nile Dear [phone]  [phone]   Mervin Kung Daughter   [phone]   Quincy Simmonds Daughter [phone]  [phone]   Livingood,Eddie Pandora Leiter   [phone]   Shanna, Un   [phone]   Hoke,Felicia Daughter   [phone]        I met face to face with patient and family in the home. Palliative Care was asked to follow this patient by consultation request of  [name], MD to address advance care planning and complex medical decision making. This is the initial visit.                                     ASSESSMENT AND PLAN / RECOMMENDATIONS:   Advance Care Planning/Goals of Care: Goals include to maximize quality of life and symptom management. Patient/health care surrogate gave his/her permission to discuss.Our advance care planning conversation included a discussion about:    The value and importance of advance care planning  Experiences with loved ones who have been seriously ill or have died  Exploration of personal, cultural or spiritual beliefs that might influence medical decisions  Exploration of goals of care in the event of a sudden injury or illness  CODE STATUS: TBD  Education provided on Palliative Medicine vs. Hospice services. Patient would like for rectal pain to be managed. We discussed  her hyperalgesia and deescalating her percocet once her rectal pain is managed. She is also agreeable to trying therapy in future. Will continue to provide supportive care. Will discuss her code status further.   Symptom Management/Plan:  Rectal pain- patient recently started on elavil 50 mg QHS; patient reports pain is down to a 6. Continue elavil as directed, gabapentin 300 mg 3-4 tablets QHS. Continue ibuprofen PRN. We discussed lidocaine jelly, which patient received during recent ED visit, which she felt was also helpful. Will check with pharmacy regarding availability as medication is currently not available. Patient is interested in acupuncture.   Constipation- chronic constipation. Start miralax 17 gm every other day routinely. Encourage adequate fluids, diet with fiber. Recommend senna-S due to opioid usage; she declines at this time.   Chronic back pain-continue Percocet 10-325 mg every 4 hours PRN. Managed by pain clinic. We discussed attempting to slowly deescalate medication once her rectal pain is managed given opioid-induced hyperalgesia.   Appetite-patient with poor to fair appetite. Encourage foods patient enjoys, nutritional supplements.   Bladder cancer- patient to receive chemotherapy every 6 weeks; cystocopy scheduled. She is to follow up with urology as scheduled.   Follow up Palliative Care Visit: Palliative care will continue to follow for complex medical decision making, advance care planning, and clarification of goals. Return in 4-6 weeks or prn.  I spent 75 minutes providing this consultation. More than 50% of the time in  this consultation was spent in counseling and care coordination.   PPS: 50%  HOSPICE ELIGIBILITY/DIAGNOSIS: TBD  Chief Complaint: Palliative Medicine initial consult, rectal pain.   HISTORY OF PRESENT ILLNESS:  ZERLINE MELCHIOR is a 81 y.o. year old female  with bladder cancer, rectal pain, chronic back pain.  Patient attempted a  4 week trial  where she attempted to wean down, but unsuccessful. Opioids for chronic back pain since 2017. Rectal pain likely related to opioid hyperalgesia. She has had work up with no clear etiology. Pain regimen currently includes  tylenol PRN, alternating with motrin, Gabapentin during the night 3-4 tablets, Oxycodone-acetaminophen every 4 hours. Rectal pain is almost constant, hurts with sitting, has saw some relief since stating elavil in past 1.5 week. Pain is down to a 6/10. Patient is followed by urology for her bladder cancer; receiving chemo drug every 6 weeks. She is to have cystoscopy in 3 months. She does endorse constipation. Appetite has been poor to fair.   History obtained from review of EMR, discussion with primary team, and interview with family, facility staff/caregiver and/or [name].  I reviewed available labs, medications, imaging, studies and related documents from the EMR.  Records reviewed and summarized above.   ROS  A 10-Point ROS is negative, except for the pertinent positives and negatives detailed per the HPI.   Physical Exam:  Constitutional: NAD General: frail appearing, thin EYES: anicteric sclera, lids intact, no discharge  ENMT: intact hearing, oral mucous membranes moist, dentition intact CV: S1S2, RRR, no LE edema Pulmonary: LCTA, no increased work of breathing, no cough, room air Abdomen: normo-active BS + 4 quadrant GU: deferred MSK: moves all extremities, ambulatory Skin: warm and dry, no rashes or wounds on visible skin Neuro: + generalized weakness,  no cognitive impairment Psych: non-anxious affect, A and O x 3 Hem/lymph/immuno: no widespread bruising CURRENT PROBLEM LIST:  Patient Active Problem List   Diagnosis Date Noted   Unspecified inflammatory spondylopathy, 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[place]) 07/10/2021   Chronic atrial fibrillation ([place]) 07/10/2021   Urinary retention with incomplete bladder emptying 07/09/2021   Rectal pain 12/24/2020    Spinal cord stimulator status 11/13/2019   Back pain 09/20/2019   Non-ischemic cardiomyopathy ([place]) 09/16/2019   PVC (premature ventricular contraction) 09/16/2019   Sinus bradycardia - borderline 09/16/2019   Gastritis 08/27/2019   Orthostatic hypotension 06/21/2019   Generalized weakness 06/21/2019   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 06/19/2019   Lumbar facet arthropathy 02/07/2019   Osteoporosis 08/28/2018   Educated about COVID-19 virus infection 07/21/2018   PAD (peripheral artery disease) ([place]) 07/13/2018   Unintentional weight loss 06/09/2018   Chronic pain syndrome 02/06/2018   Lumbar radiculopathy 02/06/2018   Lumbar spondylosis 07/11/2017   SI joint arthritis 07/11/2017   Lumbar degenerative disc disease 07/11/2017   Hyperlipidemia 03/26/2017   S/P placement of cardiac pacemaker 03/26/2017   Coronary artery disease 03/26/2017   Parkinsonian features 09/06/2016   Hypertension 09/06/2016   Spinal stenosis, lumbar region, 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/18/2016   Other idiopathic scoliosis, lumbar region 08/18/2016   NSAID induced gastritis 05/26/2016   Chronic bilateral low back pain 05/15/2016   Spinal stenosis in cervical region 01/19/2014   Constipation 12/29/2013   Spinal stenosis of thoracolumbar region 12/27/2013   COPD (chronic obstructive pulmonary disease) ([place]) 73/53/2992   Chronic systolic heart failure ([place]) 12/17/2013   History of gastric ulcer 11/28/2013   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   Bladder cancer ([place]) 11/24/2013  LBBB (left bundle branch block) 11/24/2013   Atrial ectopy 11/24/2013   Hyponatremia 11/22/2013   PAST MEDICAL HISTORY:  Active Ambulatory Problems    Diagnosis Date Noted   Hyponatremia 11/22/2013   Bladder cancer ([place]) 11/24/2013   LBBB (left bundle branch block) 11/24/2013   Atrial ectopy 11/24/2013   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   History of gastric  ulcer 32/99/2426   Chronic systolic heart failure ([place]) 12/17/2013   Spinal stenosis of thoracolumbar region 12/27/2013   COPD (chronic obstructive pulmonary disease) ([place]) 12/27/2013   Constipation 12/29/2013   Spinal stenosis in cervical region 01/19/2014   Chronic bilateral low back pain 05/15/2016   NSAID induced gastritis 05/26/2016   Parkinsonian features 09/06/2016   Hypertension 09/06/2016   Spinal stenosis, lumbar region, 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/18/2016   Other idiopathic scoliosis, lumbar region 08/18/2016   Hyperlipidemia 03/26/2017   S/P placement of cardiac pacemaker 03/26/2017   Coronary artery disease 03/26/2017   Lumbar spondylosis 07/11/2017   SI joint arthritis 07/11/2017   Lumbar degenerative disc disease 07/11/2017   Chronic pain syndrome 02/06/2018   Lumbar radiculopathy 02/06/2018   Unintentional weight loss 06/09/2018   PAD (peripheral artery disease) ([place]) 07/13/2018   Educated about COVID-19 virus infection 07/21/2018   Osteoporosis 08/28/2018   Lumbar facet arthropathy 02/07/2019   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 06/19/2019   Orthostatic hypotension 06/21/2019   Generalized weakness 06/21/2019   Gastritis 08/27/2019   Non-ischemic cardiomyopathy ([place]) 09/16/2019   PVC (premature ventricular contraction) 09/16/2019   Sinus bradycardia - borderline 09/16/2019   Back pain 09/20/2019   Spinal cord stimulator status 11/13/2019   Rectal pain 12/24/2020   Urinary retention with incomplete bladder emptying 07/09/2021   Unspecified inflammatory spondylopathy, 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[place]) 07/10/2021   Chronic atrial fibrillation ([place]) 07/10/2021   Resolved Ambulatory Problems    Diagnosis Date Noted   Palpitations 11/22/2013   Anemia, iron deficiency 11/22/2013   Chest pain 11/22/2013   Epigastric pain 11/22/2013   Hematuria 11/24/2013   Preoperative cardiovascular examination 11/27/2013   Bladder  neoplasm 12/13/2013   Anemia, posthemorrhagic, acute 83/41/9622   Chronic systolic congestive heart failure, NYHA class 3 ([place]) 05/07/2014   Cellulitis of concha of right ear 08/27/2019   Past Medical History:  Diagnosis Date   AICD (automatic cardioverter/defibrillator) present EP cardiologist--- dr Caryl Comes    Arthritis    Chronic hyponatremia    Chronic low back pain with bilateral sciatica    Chronic systolic (congestive) heart failure (H[facility])    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[facility])    DDD (degenerative disc disease), thoracolumbar    Frequent urination    Full dentures    Gait instability    GERD (gastroesophageal reflux disease)    History of iron deficiency anemia 11/2013   History of stomach ulcers 11/2013   NICM (nonischemic cardiomyopathy) ([place]) last echo 12-31-2015 ef 55-60%   Nocturia more than twice per night    S/P insertion of spinal cord stimulator followed by [facility]---- Dr Pecola Leisure (notes in care everywhere)   Scoliosis    Uses walker 02/18/2021   Wears glasses    SOCIAL HX:  Social History   Tobacco Use   Smoking status: Every Day    Packs/day: 0.50    Years: 56.00    Total pack years: 28.00    Types: Cigarettes   Smokeless tobacco: Never   Tobacco comments:    02-11-2019  per pt currently smokes 10 cig's per day ,  started smoking age 86  Substance Use Topics   Alcohol use: No   FAMILY HX:  Family History  Problem Relation Age of Onset   Cancer Mother    Cancer Brother       ALLERGIES: No Known Allergies   PERTINENT MEDICATIONS:  Outpatient Encounter Medications as of 12/03/2021  Medication Sig   acetaminophen (TYLENOL) 500 MG tablet Take 1,000 mg by mouth every 8 (eight) hours as needed for moderate pain.   amitriptyline (ELAVIL) 50 MG tablet Take 1 tablet (50 mg total) by mouth at bedtime.   amLODipine (NORVASC) 5 MG tablet Take 1 tablet (5 mg total) by mouth daily.   gabapentin (NEURONTIN) 300 MG capsule Take 2 capsules (600 mg total) by mouth 3  (three) times daily. (Patient taking differently: Take 900-1,200 mg by mouth at bedtime.)   losartan (COZAAR) 100 MG tablet Take 1 tablet (100 mg total) by mouth at bedtime.   Menthol, Topical Analgesic, (BIOFREEZE EX) Apply 1 application topically 4 (four) times daily as needed (back pain).   metoprolol succinate (TOPROL-XL) 25 MG 24 hr tablet TAKE 1 TABLET(25 MG) BY MOUTH DAILY (Patient taking differently: Take 25 mg by mouth daily.)   oxyCODONE-acetaminophen (PERCOCET) 10-325 MG tablet Take 1 tablet by mouth every 4 (four) hours as needed for pain.   [START ON 12/27/2021] oxyCODONE-acetaminophen (PERCOCET) 10-325 MG tablet Take 1 tablet by mouth every 4 (four) hours as needed for pain.   [START ON 01/26/2022] oxyCODONE-acetaminophen (PERCOCET) 10-325 MG tablet Take 1 tablet by mouth every 4 (four) hours as needed for pain.   pantoprazole (PROTONIX) 40 MG tablet Take 40 mg by mouth at bedtime.   polyethylene glycol (MIRALAX / GLYCOLAX) packet Take 17 g by mouth daily as needed for mild constipation.    promethazine (PHENERGAN) 12.5 MG tablet TAKE 1 TABLET(12.5 MG) BY MOUTH EVERY 8 HOURS AS NEEDED FOR NAUSEA OR VOMITING (Patient taking differently: Take 12.5 mg by mouth every 8 (eight) hours as needed for nausea or vomiting.)   sucralfate (CARAFATE) 1 g tablet TAKE 1 TABLET BY MOUTH ONE HOUR BEFORE EACH MEAL AND TWO HOURS AFTER OTHER MEDICATIONS (Patient taking differently: Take 1 g by mouth See admin instructions. Take 1 tablet one hour before each meal and 2 hours after all other medications)   Facility-Administered Encounter Medications as of 12/03/2021  Medication   epirubicin (ELLENCE) 50 mg in sodium chloride 0.9 % bladder instillation   epirubicin (ELLENCE) 80 mg in sodium chloride (PF) 0.9 % bladder instillation   gemcitabine (GEMZAR) chemo syringe for bladder instillation 2,000 mg   gemcitabine (GEMZAR) chemo syringe for bladder instillation 2,000 mg   Thank you for the opportunity to  participate in the care of [name].  The palliative care team will continue to follow. Please call our office at [phone] if we can be of additional assistance.   Ezekiel Slocumb, NP   COVID-19 PATIENT SCREENING TOOL Asked and negative response unless otherwise noted:  Have you had symptoms of covid, tested positive or been in contact with someone with symptoms/positive test in the past 5-10 days? No

## 2021-12-21 NOTE — Progress Notes (Unsigned)
[place]  Telephone:(336) [phone] Fax:(336) [phone]  ID: MAHATHI POKORNEY OB: 01-Jun-1940  MR#: [id number]  AOZ#:[id number]  Patient Care Team: [name], MD as PCP - General (Internal Medicine) Deboraha Sprang, MD as PCP - Electrophysiology (Cardiology) Briscoe Deutscher, MD (Family Medicine)  CHIEF COMPLAINT: Non-invasive bladder cancer.  INTERVAL HISTORY: Patient last seen in clinic in February 2021.  Patient returns to clinic today for further evaluation and reconsideration of cycle 1 of intravesical gemcitabine for her noninvasive bladder cancer.  Treatment had been delayed multiple times secondary to concern for UTI as well as possible Covid exposure.  She currently feels well and is asymptomatic.  She has no neurologic complaints.  She denies any recent fevers or illnesses.  She has a good appetite and denies weight loss.  She has no chest pain, shortness of breath, cough, or hemoptysis.  She denies any nausea, vomiting, constipation, or diarrhea.  She has no urinary complaints.  Patient offers no specific complaints today.  REVIEW OF SYSTEMS:   Review of Systems  Constitutional: Negative.  Negative for fever, malaise/fatigue and weight loss.  Respiratory: Negative.  Negative for cough, hemoptysis and shortness of breath.   Cardiovascular: Negative.  Negative for chest pain and leg swelling.  Gastrointestinal: Negative.  Negative for abdominal pain.  Genitourinary: Negative.  Negative for dysuria, frequency and hematuria.  Musculoskeletal: Negative.  Negative for back pain.  Skin: Negative.  Negative for rash.  Neurological: Negative.  Negative for dizziness, focal weakness, weakness and headaches.  Psychiatric/Behavioral: Negative.  The patient is not nervous/anxious.     As per HPI. Otherwise, a complete review of systems is negative.  PAST MEDICAL HISTORY: Past Medical History:  Diagnosis Date   AICD (automatic cardioverter/defibrillator) present EP  cardiologist--- dr Caryl Comes    placement 05-07-2014 , ef 25%,  NICM/  (02-11-2019 last echo 08-31-2018 ef 60-65%)   Arthritis    "in about all my joints; for sure in my back"   Bladder cancer [facility]) urologist-  dr Junious Silk   dx 1995--  recurrent bladder cancer 2015 , s/p TURBT's and chemo instillation's ;   04/ 2019  s/p TURBT   Chronic hyponatremia    Chronic low back pain with bilateral sciatica    s/p  spinal cord stimulator @ [facility]  [phone]   Chronic systolic (congestive) heart failure [facility])    cardiologist-  dr Rogue Jury   COPD with emphysema ([place])    (02-11-2019  per pt has never been on oxygen)   Coronary artery disease cardiologist-  dr Kathlyn [place]   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% per cardiac cath 03-03-2014   DDD (degenerative disc disease), thoracolumbar    Frequent urination    Full dentures    Gait instability    due to chronic low back pain, uses roller walker   GERD (gastroesophageal reflux disease)    History of iron deficiency anemia 11/2013   resolved w/ IV Iron  (02-11-2019  per pt has not had any issues since 2015)   History of stomach ulcers 11/2013   Hypertension    LBBB (left bundle branch block)    NICM (nonischemic cardiomyopathy) ([place]) last echo 12-31-2015 ef 55-60%   dx 09/ 2015 per echo 20%;  myoview 09/ 2015 ef 28%;  per cardiac cath 12/ 2015 ef 30%;     Nocturia more than twice per night    S/P insertion of spinal cord stimulator followed by [facility]---- Dr Pecola Leisure (notes in care  everywhere)   10-09-2018  '@[facility]'$ --- thoracic spinal cord stimular/ generator  (device from [employer])---- per pt has a control   Scoliosis    Uses walker 02/18/2021   all the time   Wears glasses    for reading    PAST SURGICAL HISTORY: Past Surgical History:  Procedure Laterality Date   BI-VENTRICULAR IMPLANTABLE CARDIOVERTER DEFIBRILLATOR N/A 05/07/2014   Procedure: BI-VENTRICULAR IMPLANTABLE CARDIOVERTER DEFIBRILLATOR  (CRT-D);  Surgeon: Deboraha Sprang, MD;   Location: [facility] CATH LAB;  Service: Cardiovascular;  Laterality: N/A;   BIV PACEMAKER GENERATOR CHANGE OUT     CARDIAC CATHETERIZATION  03-03-2014  dr Kathlyn [place]   [facility]   pLAD 20%, pRCA 20%, dRCA 50%, RPLS 50%;  ef 30%, mild elevated LVEDP, mild gradient across aortic valve LVOT   CARDIOVASCULAR STRESS TEST  11/25/2013   High risk nuclear study w/ large high severity inferior wall perfusion defect on stress and rest images, large mild severity anteroseptal wall perfusion defect on stress and rest images, No inducible ischemia/ global moderate hypokinesis, ef 28%   CATARACT EXTRACTION W/ INTRAOCULAR LENS  IMPLANT, BILATERAL Bilateral right 12-2013 / left  02-2014   colonscopy  11/2020   benign polyps removed   CYSTOSCOPY W/ RETROGRADES Bilateral 01/06/2015   Procedure: CYSTOSCOPY WITH  BLADDER BIOPSY BILATERAL RETROGRADE PYELOGRAM,INSTILLATION OF MITOMYCIN C;  Surgeon: [name], MD;  Location: [facility];  Service: Urology;  Laterality: Bilateral;   CYSTOSCOPY WITH BIOPSY N/A 10/06/2015   Procedure: CYSTO WITH BLADDER BIOPSY, FULGERATION, CHEMO IRRIGATION EPIRUBICIN IN PACU;  Surgeon: [name], MD;  Location: [facility];  Service: Urology;  Laterality: N/A;   CYSTOSCOPY WITH BIOPSY N/A 02/12/2019   Procedure: CYSTOSCOPY WITH BIOPSY/ FULGURATION/ INSTILLATION OF GEMCITABINE, bilateral retrograde turbt greater 5cm;  Surgeon: [name], MD;  Location: [facility];  Service: Urology;  Laterality: N/A;   CYSTOSCOPY WITH BIOPSY N/A 03/19/2021   Procedure: CYSTOSCOPY WITH TRANSURETHRAL RESECTION OF BLADDER TUMOR  GREATER THAN 5CM WITH POST-OPERATIVE INSTILLATION OF EPIRUBICIN;  Surgeon: [name], MD;  Location: [facility];  Service: Urology;  Laterality: N/A;   CYSTOSCOPY WITH FULGERATION N/A 06/30/2017   Procedure: Marland Kitchen CYSTOSCOPY WITH [place] FULGERATION/ BLADDER BIOPSY/ INSTILLATION OF EPIRUBICIN;  Surgeon: [name], MD;  Location: [facility];  Service: Urology;  Laterality: N/A;   CYSTOSCOPY WITH RETROGRADE PYELOGRAM, URETEROSCOPY AND STENT PLACEMENT Bilateral 04/18/2014   Procedure: CYSTOSCOPY WITH RETROGRADE PYELOGRAM;  Surgeon: [name], MD;  Location: [facility];  Service: Urology;  Laterality: Bilateral;   ESOPHAGOGASTRODUODENOSCOPY N/A 11/28/2013   Procedure: ESOPHAGOGASTRODUODENOSCOPY (EGD);  Surgeon: Arta Silence, MD;  Location: [facility] ENDOSCOPY;  Service: Endoscopy;  Laterality: N/A;   ICD GENERATOR CHANGEOUT N/A 11/04/2021   Procedure: ICD GENERATOR CHANGEOUT;  Surgeon: Deboraha Sprang, MD;  Location: [place] CV LAB;  Service: Cardiovascular;  Laterality: N/A;   LUMBAR DISC SURGERY  1980's   "ruptured disc"   SPINAL CORD STIMULATOR IMPLANT  10-09-2018   '@[facility]'$    Thoracic spinal cord stimular/ genertor  (left flank)----- (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[employer])   TRANSTHORACIC ECHOCARDIOGRAM  12/31/2015   dr Caryl Comes   ef 02-409, grade 1 diastolic dysfunction/ mild MR/ septal motion showed abnormal function and dyssynergy   TRANSURETHRAL RESECTION OF BLADDER  1995   TRANSURETHRAL RESECTION OF BLADDER TUMOR N/A 12/13/2013   Procedure: TRANSURETHRAL RESECTION OF BLADDER TUMOR (TURBT);  Surgeon: [name], MD;  Location: [facility];  Service: Urology;  Laterality: N/A;   TRANSURETHRAL RESECTION OF BLADDER TUMOR N/A 01/17/2014  Procedure: TRANSURETHRAL RESECTION OF BLADDER TUMOR (TURBT);  Surgeon: [name], MD;  Location: [facility];  Service: Urology;  Laterality: N/A;   TRANSURETHRAL RESECTION OF BLADDER TUMOR N/A 04/18/2014   Procedure: TRANSURETHRAL RESECTION OF BLADDER TUMOR (TURBT), CYSTOGRAM;  Surgeon: [name], MD;  Location: [facility];  Service: Urology;  Laterality: N/A;   TRANSURETHRAL RESECTION OF BLADDER TUMOR N/A 12/13/2019   Procedure: TRANSURETHRAL RESECTION OF BLADDER TUMOR (TURBT) GREATER THAN 5CM WITH CYSTOSCOPY/ POST OPERATIVE INSTILLATION OF GEMCITABINE;  Surgeon: [name], MD;  Location:  [facility];  Service: Urology;  Laterality: N/A;   TRANSURETHRAL RESECTION OF BLADDER TUMOR N/A 10/06/2020   Procedure: TRANSURETHRAL RESECTION OF BLADDER TUMOR (TURBT)/ CYSTOSCOPY/  BLADDER BIOPSY;  Surgeon: [name], MD;  Location: [facility];  Service: Urology;  Laterality: N/A;   TRANSURETHRAL RESECTION OF BLADDER TUMOR N/A 11/30/2021   Procedure: TRANSURETHRAL RESECTION OF BLADDER TUMOR (TURBT) WITH POST OPERATIVE INSTILLATION OF GEMCITABINE;  Surgeon: [name], MD;  Location: [facility];  Service: Urology;  Laterality: N/A;    FAMILY HISTORY: Family History  Problem Relation Age of Onset   Cancer Mother    Cancer Brother     ADVANCED DIRECTIVES (Y/N):  N  HEALTH MAINTENANCE: Social History   Tobacco Use   Smoking status: Every Day    Packs/day: 0.50    Years: 56.00    Total pack years: 28.00    Types: Cigarettes   Smokeless tobacco: Never   Tobacco comments:    02-11-2019  per pt currently smokes 10 cig's per day , started smoking age 37  Vaping Use   Vaping Use: Never used  Substance Use Topics   Alcohol use: No   Drug use: No     Colonoscopy:  PAP:  Bone density:  Lipid panel:  No Known Allergies  Current Outpatient Medications  Medication Sig Dispense Refill   acetaminophen (TYLENOL) 500 MG tablet Take 1,000 mg by mouth every 8 (eight) hours as needed for moderate pain.     amitriptyline (ELAVIL) 50 MG tablet Take 1 tablet (50 mg total) by mouth at bedtime. 90 tablet 1   amLODipine (NORVASC) 5 MG tablet Take 1 tablet (5 mg total) by mouth daily. 90 tablet 2   gabapentin (NEURONTIN) 300 MG capsule Take 2 capsules (600 mg total) by mouth 3 (three) times daily. (Patient taking differently: Take 900-1,200 mg by mouth at bedtime.) 270 capsule 1   losartan (COZAAR) 100 MG tablet Take 1 tablet (100 mg total) by mouth at bedtime. 90 tablet 1   Menthol, Topical Analgesic, (BIOFREEZE EX) Apply 1 application topically 4 (four) times daily as  needed (back pain).     metoprolol succinate (TOPROL-XL) 25 MG 24 hr tablet TAKE 1 TABLET(25 MG) BY MOUTH DAILY (Patient taking differently: Take 25 mg by mouth daily.) 90 tablet 3   oxyCODONE-acetaminophen (PERCOCET) 10-325 MG tablet Take 1 tablet by mouth every 4 (four) hours as needed for pain. 150 tablet 0   [START ON 12/27/2021] oxyCODONE-acetaminophen (PERCOCET) 10-325 MG tablet Take 1 tablet by mouth every 4 (four) hours as needed for pain. 150 tablet 0   [START ON 01/26/2022] oxyCODONE-acetaminophen (PERCOCET) 10-325 MG tablet Take 1 tablet by mouth every 4 (four) hours as needed for pain. 150 tablet 0   pantoprazole (PROTONIX) 40 MG tablet Take 40 mg by mouth at bedtime.     polyethylene glycol (MIRALAX / GLYCOLAX) packet Take 17 g by mouth daily as needed for mild constipation.  promethazine (PHENERGAN) 12.5 MG tablet TAKE 1 TABLET(12.5 MG) BY MOUTH EVERY 8 HOURS AS NEEDED FOR NAUSEA OR VOMITING (Patient taking differently: Take 12.5 mg by mouth every 8 (eight) hours as needed for nausea or vomiting.) 30 tablet 2   sucralfate (CARAFATE) 1 g tablet TAKE 1 TABLET BY MOUTH ONE HOUR BEFORE EACH MEAL AND TWO HOURS AFTER OTHER MEDICATIONS (Patient taking differently: Take 1 g by mouth See admin instructions. Take 1 tablet one hour before each meal and 2 hours after all other medications) 90 tablet 1   No current facility-administered medications for this visit.   Facility-Administered Medications Ordered in Other Visits  Medication Dose Route Frequency Provider Last Rate Last Admin   epirubicin (ELLENCE) 50 mg in sodium chloride 0.9 % bladder instillation  50 mg Bladder Instillation Once [name], MD       epirubicin (ELLENCE) 80 mg in sodium chloride (PF) 0.9 % bladder instillation  80 mg Bladder Instillation Once Nyoka Cowden, Terri L, RPH       gemcitabine (GEMZAR) chemo syringe for bladder instillation 2,000 mg  2,000 mg Bladder Instillation Once [name], MD       gemcitabine  [facility]) chemo syringe for bladder instillation 2,000 mg  2,000 mg Bladder Instillation Once [name], MD        OBJECTIVE: There were no vitals filed for this visit.   There is no height or weight on file to calculate BMI.    ECOG FS:0 - Asymptomatic  General: Well-developed, well-nourished, no acute distress. Eyes: Pink conjunctiva, anicteric sclera. HEENT: Normocephalic, moist mucous membranes. Lungs: No audible wheezing or coughing. Heart: Regular rate and rhythm. Abdomen: Soft, nontender, no obvious distention. Musculoskeletal: No edema, cyanosis, or clubbing. Neuro: Alert, answering all questions appropriately. Cranial nerves grossly intact. Skin: No rashes or petechiae noted. Psych: Normal affect.  LAB RESULTS:  Lab Results  Component Value Date   NA 134 (L) 11/22/2021   K 4.6 11/22/2021   CL 101 11/22/2021   CO2 24 11/22/2021   GLUCOSE 103 (H) 11/22/2021   BUN 13 11/22/2021   CREATININE 0.45 11/22/2021   CALCIUM 9.5 11/22/2021   PROT 7.0 11/22/2021   ALBUMIN 4.2 11/22/2021   AST 33 11/22/2021   ALT 12 11/22/2021   ALKPHOS 84 11/22/2021   BILITOT 0.7 11/22/2021   GFRNONAA >60 11/22/2021   GFRAA >60 06/17/2019    Lab Results  Component Value Date   WBC 6.0 11/22/2021   NEUTROABS 3.5 11/22/2021   HGB 13.9 11/22/2021   HCT 41.5 11/22/2021   MCV 96.7 11/22/2021   PLT 158 11/22/2021     STUDIES: CT Abdomen Pelvis W Contrast  Result Date: 11/22/2021 CLINICAL DATA:  Abdominal pain, acute, nonlocalized. Progressive lower abdominal pain/rectal pain, intermittent nausea EXAM: CT ABDOMEN AND PELVIS WITH CONTRAST TECHNIQUE: Multidetector CT imaging of the abdomen and pelvis was performed using the standard protocol following bolus administration of intravenous contrast. RADIATION DOSE REDUCTION: This exam was performed according to the departmental dose-optimization program which includes automated exposure control, adjustment of the mA and/or kV according to  patient size and/or use of iterative reconstruction technique. CONTRAST:  147m OMNIPAQUE IOHEXOL 300 MG/ML  SOLN COMPARISON:  12/10/2020 FINDINGS: Lower chest: At least mild emphysema. Pacemaker leads are seen within the right heart. No acute abnormality. Hepatobiliary: Mild hepatic steatosis. No enhancing intrahepatic mass. Gallbladder unremarkable. Mild intra and extrahepatic biliary ductal dilation is stable, measuring 8-9 mm distally. A distal obstructing lesion is not clearly identified on this examination  Pancreas: The pancreatic duct is dilated measuring 5 mm in diameter to the level of the ampulla. The pancreatic parenchyma demonstrates normal enhancement. No peripancreatic inflammatory changes are seen. Spleen: Unremarkable Adrenals/Urinary Tract: Adrenal glands are unremarkable. The kidneys are normal in size and position. Mild bilateral renal cortical scarring is present. The kidneys are otherwise unremarkable. The bladder is moderately distended, but is otherwise unremarkable. Stomach/Bowel: Moderate sigmoid diverticulosis without superimposed acute inflammatory change. The stomach, small bowel, and large bowel are otherwise unremarkable. Appendix normal. No free intraperitoneal gas or fluid. Vascular/Lymphatic: Aortic atherosclerosis. No enlarged abdominal or pelvic lymph nodes. Reproductive: Uterus and bilateral adnexa are unremarkable. Other: No abdominal wall hernia. Dorsal column stimulator battery pack noted within the subcutaneous fat of the left flank. Musculoskeletal: Osseous structures are diffusely osteopenic. Advanced degenerative changes are seen within the lumbar spine. No acute bone abnormality. No lytic or blastic bone lesion. IMPRESSION: 1. No acute intra-abdominal pathology identified. No definite radiographic explanation for the patient's reported symptoms. 2. Mild emphysema. 3. Mild hepatic steatosis. 4. Stable intra and extrahepatic biliary ductal dilation and dilation of the  pancreatic duct to the level of the ampulla. A distal obstructing lesion is not clearly identified on this examination. Correlation with liver enzymes may be helpful to exclude an obstructing process, such as ampullary stenosis. 5. Moderate sigmoid diverticulosis without superimposed acute inflammatory change. Aortic Atherosclerosis (ICD10-I70.0) and Emphysema (ICD10-J43.9). Electronically Signed   By: Fidela Salisbury M.D.   On: 11/22/2021 02:14    ASSESSMENT: Non-invasive bladder cancer.  PLAN:    1. Non-invasive bladder cancer: Patient will benefit from 2000 mg intravesical gemcitabine weekly x6.  Patient has a persistently positive UA, but now is on chronic antibiotics.  All laboratory work will be monitored by patient's primary urologist.  Patient expressed understanding that if she has any questions, concerns, or side effects from her treatment that she should defer to her primary urologist.  Proceed with cycle 1 of 6 of weekly treatment today.  Return to clinic weekly for treatments.  No further MD follow-up has been scheduled.  Please refer patient back if there are any questions or concerns.  I spent a total of 30 minutes reviewing chart data, face-to-face evaluation with the patient, counseling and coordination of care as detailed above.   Patient expressed understanding and was in agreement with this plan. She also understands that She can call clinic at any time with any questions, concerns, or complaints.    Cancer Staging  Bladder cancer [facility]) Staging form: Urinary Bladder, AJCC 7th Edition - Clinical stage from 03/19/2019: Stage 0is (Tis, N0, M0) - Signed by Lloyd Huger, MD on 03/19/2019   Lloyd Huger, MD   12/21/2021 10:24 PM

## 2021-12-22 ENCOUNTER — Inpatient Hospital Stay: Payer: Medicare Other | Attending: Oncology | Admitting: Oncology

## 2021-12-22 VITALS — BP 165/105 | HR 75 | Temp 98.2°F | Ht 68.0 in | Wt 118.0 lb

## 2021-12-22 DIAGNOSIS — I5022 Chronic systolic (congestive) heart failure: Secondary | ICD-10-CM | POA: Insufficient documentation

## 2021-12-22 DIAGNOSIS — C679 Malignant neoplasm of bladder, unspecified: Secondary | ICD-10-CM | POA: Diagnosis not present

## 2021-12-22 DIAGNOSIS — I110 Hypertensive heart disease with heart failure: Secondary | ICD-10-CM | POA: Diagnosis not present

## 2021-12-22 DIAGNOSIS — C678 Malignant neoplasm of overlapping sites of bladder: Secondary | ICD-10-CM | POA: Diagnosis not present

## 2021-12-22 DIAGNOSIS — F17210 Nicotine dependence, cigarettes, uncomplicated: Secondary | ICD-10-CM | POA: Insufficient documentation

## 2021-12-22 DIAGNOSIS — Z5111 Encounter for antineoplastic chemotherapy: Secondary | ICD-10-CM | POA: Diagnosis not present

## 2021-12-22 DIAGNOSIS — G894 Chronic pain syndrome: Secondary | ICD-10-CM | POA: Diagnosis not present

## 2021-12-23 ENCOUNTER — Telehealth: Payer: Self-pay

## 2021-12-23 ENCOUNTER — Encounter: Payer: Self-pay | Admitting: Oncology

## 2021-12-23 MED ORDER — PROCHLORPERAZINE MALEATE 10 MG PO TABS: 10.0000 mg | ORAL_TABLET | Freq: Four times a day (QID) | ORAL | 1 refills | Status: DC | PRN

## 2021-12-23 MED ORDER — ONDANSETRON HCL 8 MG PO TABS: 8.0000 mg | ORAL_TABLET | Freq: Three times a day (TID) | ORAL | 1 refills | Status: DC | PRN

## 2021-12-23 NOTE — Progress Notes (Signed)
START ON PATHWAY REGIMEN - Bladder     A cycle is every 21 days:     Pembrolizumab   **Always confirm dose/schedule in your pharmacy ordering system**  Patient Characteristics: Pre-Cystectomy or Nonsurgical Candidate (Clinical Staging), High-Grade cTa, cN0 or cTis/cT1, cN0, Refractory to Intravesical BCG and Referring Urologist Indicates Other Intravesical Therapies Not Preferred Therapeutic Status: Pre-Cystectomy or Nonsurgical Candidate (Clinical Staging) AJCC M Category: cM0 AJCC 8 Stage Grouping: 0is AJCC T Category: cTis AJCC N Category: cN0 Intent of Therapy: Non-Curative / Palliative Intent, Discussed with Patient

## 2021-12-23 NOTE — Telephone Encounter (Signed)
-----   Message from Lloyd Huger, MD sent at 12/23/2021  2:22 PM EDT ----- Just an FYI, I am putting in a treatment plan for single agent IV Keytruda for this patient.  She is expecting a phone call today or tomorrow with the treatment plan.  We talked about intravesicular gemcitabine and not IV Keytruda, but I think this is the better option for her.  She will need chemo education class.  If she wants, we can set an appointment up prior to her start to further discuss.  Or we can just talk about it at her first treatment. She likely will need a port, but I can discuss this with her when I see her next.  I arbitrarily picked October 12 for the first day of treatment, but if this is not possible let me know and I can change the dates.  Thanks!

## 2021-12-23 NOTE — Telephone Encounter (Signed)
Called patient to inform of below information. Patient expressed she does not want to do [place]. She is terrified of the side effects and would like to come in on 10/10 to discuss with Dr. Grayland Ormond further. Abbie please schedule her on 10/10. She is very anxious and wants an appointment as soon as possible.

## 2021-12-24 ENCOUNTER — Other Ambulatory Visit: Payer: Self-pay | Admitting: Internal Medicine

## 2021-12-24 ENCOUNTER — Other Ambulatory Visit: Payer: Self-pay

## 2021-12-24 LAB — TOXASSURE SELECT 13 (MW), URINE

## 2021-12-24 NOTE — Progress Notes (Signed)
[name]  [phone] [phone]  ID: [id number] BRISSETT OB: 1940/12/16  MR#: [id number]  QIO#:[id number]  Patient Care Team: [name], MD as PCP - General (Internal Medicine) Deboraha Sprang, MD as PCP - Electrophysiology (Cardiology) Briscoe Deutscher, MD (Family Medicine)  CHIEF COMPLAINT: Minimally invasive bladder cancer.  INTERVAL HISTORY: Patient returns to clinic today for further evaluation and treatment planning.  She currently feels well and is asymptomatic.  She has no neurologic complaints.  She denies any recent fevers or illnesses.  She has a good appetite and denies weight loss.  She has no chest pain, shortness of breath, cough, or hemoptysis.  She denies any nausea, vomiting, constipation, or diarrhea.  She has no urinary complaints.  Patient offers no specific complaints today.  REVIEW OF SYSTEMS:   Review of Systems  Constitutional: Negative.  Negative for fever, malaise/fatigue and weight loss.  Respiratory: Negative.  Negative for cough, hemoptysis and shortness of breath.   Cardiovascular: Negative.  Negative for chest pain and leg swelling.  Gastrointestinal: Negative.  Negative for abdominal pain.  Genitourinary: Negative.  Negative for dysuria, frequency and hematuria.  Musculoskeletal: Negative.  Negative for back pain.  Skin: Negative.  Negative for rash.  Neurological: Negative.  Negative for dizziness, focal weakness, weakness and headaches.  Psychiatric/Behavioral: Negative.  The patient is not nervous/anxious.     As per HPI. Otherwise, a complete review of systems is negative.  PAST MEDICAL HISTORY: Past Medical History:  Diagnosis Date   AICD (automatic cardioverter/defibrillator) present EP cardiologist--- dr Caryl Comes    placement 05-07-2014 , ef 25%,  NICM/  (02-11-2019 last echo 08-31-2018 ef 60-65%)   Arthritis    "in about all my joints; for sure in my back"   Bladder cancer [facility]) urologist-  dr Junious Silk    dx 1995--  recurrent bladder cancer 2015 , s/p TURBT's and chemo instillation's ;   04/ 2019  s/p TURBT   Chronic hyponatremia    Chronic low back pain with bilateral sciatica    s/p  spinal cord stimulator @ [facility]  [phone]   Chronic systolic (congestive) heart failure [facility])    cardiologist-  dr Rogue Jury   COPD with emphysema ([place])    (02-11-2019  per pt has never been on oxygen)   Coronary artery disease cardiologist-  dr Kathlyn [place]   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% per cardiac cath 03-03-2014   DDD (degenerative disc disease), thoracolumbar    Frequent urination    Full dentures    Gait instability    due to chronic low back pain, uses roller walker   GERD (gastroesophageal reflux disease)    History of iron deficiency anemia 11/2013   resolved w/ IV Iron  (02-11-2019  per pt has not had any issues since 2015)   History of stomach ulcers 11/2013   Hypertension    LBBB (left bundle branch block)    NICM (nonischemic cardiomyopathy) ([place]) last echo 12-31-2015 ef 55-60%   dx 09/ 2015 per echo 20%;  myoview 09/ 2015 ef 28%;  per cardiac cath 12/ 2015 ef 30%;     Nocturia more than twice per night    S/P insertion of spinal cord stimulator followed by [facility]---- Dr Pecola Leisure (notes in care everywhere)   10-09-2018  '@[facility]'$ --- thoracic spinal cord stimular/ generator  (device from [facility])---- per pt has a control   Scoliosis    Uses walker 02/18/2021   all the time  Wears glasses    for reading    PAST SURGICAL HISTORY: Past Surgical History:  Procedure Laterality Date   BI-VENTRICULAR IMPLANTABLE CARDIOVERTER DEFIBRILLATOR N/A 05/07/2014   Procedure: BI-VENTRICULAR IMPLANTABLE CARDIOVERTER DEFIBRILLATOR  (CRT-D);  Surgeon: Deboraha Sprang, MD;  Location: [facility] CATH LAB;  Service: Cardiovascular;  Laterality: N/A;   BIV PACEMAKER GENERATOR CHANGE OUT     CARDIAC CATHETERIZATION  03-03-2014  dr Kathlyn [place]   [facility]   pLAD 20%, pRCA 20%, dRCA 50%, RPLS 50%;  ef 30%, mild  elevated LVEDP, mild gradient across aortic valve LVOT   CARDIOVASCULAR STRESS TEST  11/25/2013   High risk nuclear study w/ large high severity inferior wall perfusion defect on stress and rest images, large mild severity anteroseptal wall perfusion defect on stress and rest images, No inducible ischemia/ global moderate hypokinesis, ef 28%   CATARACT EXTRACTION W/ INTRAOCULAR LENS  IMPLANT, BILATERAL Bilateral right 12-2013 / left  02-2014   colonscopy  11/2020   benign polyps removed   CYSTOSCOPY W/ RETROGRADES Bilateral 01/06/2015   Procedure: CYSTOSCOPY WITH  BLADDER BIOPSY BILATERAL RETROGRADE PYELOGRAM,INSTILLATION OF MITOMYCIN C;  Surgeon: [name], MD;  Location: [facility];  Service: Urology;  Laterality: Bilateral;   CYSTOSCOPY WITH BIOPSY N/A 10/06/2015   Procedure: CYSTO WITH BLADDER BIOPSY, FULGERATION, CHEMO IRRIGATION EPIRUBICIN IN PACU;  Surgeon: [name], MD;  Location: [facility];  Service: Urology;  Laterality: N/A;   CYSTOSCOPY WITH BIOPSY N/A 02/12/2019   Procedure: CYSTOSCOPY WITH BIOPSY/ FULGURATION/ INSTILLATION OF GEMCITABINE, bilateral retrograde turbt greater 5cm;  Surgeon: [name], MD;  Location: [facility];  Service: Urology;  Laterality: N/A;   CYSTOSCOPY WITH BIOPSY N/A 03/19/2021   Procedure: CYSTOSCOPY WITH TRANSURETHRAL RESECTION OF BLADDER TUMOR  GREATER THAN 5CM WITH POST-OPERATIVE INSTILLATION OF EPIRUBICIN;  Surgeon: [name], MD;  Location: [facility];  Service: Urology;  Laterality: N/A;   CYSTOSCOPY WITH FULGERATION N/A 06/30/2017   Procedure: Marland Kitchen CYSTOSCOPY WITH [place] FULGERATION/ BLADDER BIOPSY/ INSTILLATION OF EPIRUBICIN;  Surgeon: [name], MD;  Location[facility];  Service: Urology;  Laterality: N/A;   CYSTOSCOPY WITH RETROGRADE PYELOGRAM, URETEROSCOPY AND STENT PLACEMENT Bilateral 04/18/2014   Procedure: CYSTOSCOPY WITH RETROGRADE PYELOGRAM;  Surgeon: [name], MD;   Location: [facility];  Service: Urology;  Laterality: Bilateral;   ESOPHAGOGASTRODUODENOSCOPY N/A 11/28/2013   Procedure: ESOPHAGOGASTRODUODENOSCOPY (EGD);  Surgeon: Arta Silence, MD;  Location: [facility] ENDOSCOPY;  Service: Endoscopy;  Laterality: N/A;   ICD GENERATOR CHANGEOUT N/A 11/04/2021   Procedure: ICD GENERATOR CHANGEOUT;  Surgeon: Deboraha Sprang, MD;  Location: [facility] CV LAB;  Service: Cardiovascular;  Laterality: N/A;   LUMBAR DISC SURGERY  1980's   "ruptured disc"   SPINAL CORD STIMULATOR IMPLANT  10-09-2018   '@[facility]'$    Thoracic spinal cord stimular/ genertor  (left flank)----- (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[employer])   TRANSTHORACIC ECHOCARDIOGRAM  12/31/2015   dr Caryl Comes   ef 16-109, grade 1 diastolic dysfunction/ mild MR/ septal motion showed abnormal function and dyssynergy   TRANSURETHRAL RESECTION OF BLADDER  1995   TRANSURETHRAL RESECTION OF BLADDER TUMOR N/A 12/13/2013   Procedure: TRANSURETHRAL RESECTION OF BLADDER TUMOR (TURBT);  Surgeon: [name], MD;  Location: [facility];  Service: Urology;  Laterality: N/A;   TRANSURETHRAL RESECTION OF BLADDER TUMOR N/A 01/17/2014   Procedure: TRANSURETHRAL RESECTION OF BLADDER TUMOR (TURBT);  Surgeon: [name], MD;  Location: [facility];  Service: Urology;  Laterality: N/A;   TRANSURETHRAL RESECTION OF BLADDER TUMOR N/A 04/18/2014   Procedure:  TRANSURETHRAL RESECTION OF BLADDER TUMOR (TURBT), CYSTOGRAM;  Surgeon: [name], MD;  Location: [facility];  Service: Urology;  Laterality: N/A;   TRANSURETHRAL RESECTION OF BLADDER TUMOR N/A 12/13/2019   Procedure: TRANSURETHRAL RESECTION OF BLADDER TUMOR (TURBT) GREATER THAN 5CM WITH CYSTOSCOPY/ POST OPERATIVE INSTILLATION OF GEMCITABINE;  Surgeon: [name], MD;  Location: [facility];  Service: Urology;  Laterality: N/A;   TRANSURETHRAL RESECTION OF BLADDER TUMOR N/A 10/06/2020   Procedure: TRANSURETHRAL RESECTION OF BLADDER TUMOR (TURBT)/ CYSTOSCOPY/  BLADDER BIOPSY;   Surgeon: [name], MD;  Location: [facility];  Service: Urology;  Laterality: N/A;   TRANSURETHRAL RESECTION OF BLADDER TUMOR N/A 11/30/2021   Procedure: TRANSURETHRAL RESECTION OF BLADDER TUMOR (TURBT) WITH POST OPERATIVE INSTILLATION OF GEMCITABINE;  Surgeon: [name], MD;  Location: [facility];  Service: Urology;  Laterality: N/A;    FAMILY HISTORY: Family History  Problem Relation Age of Onset   Cancer Mother    Cancer Brother     ADVANCED DIRECTIVES (Y/N):  N  HEALTH MAINTENANCE: Social History   Tobacco Use   Smoking status: Every Day    Packs/day: 0.50    Years: 56.00    Total pack years: 28.00    Types: Cigarettes   Smokeless tobacco: Never   Tobacco comments:    02-11-2019  per pt currently smokes 10 cig's per day , started smoking age 89  Vaping Use   Vaping Use: Never used  Substance Use Topics   Alcohol use: No   Drug use: No     Colonoscopy:  PAP:  Bone density:  Lipid panel:  No Known Allergies  Current Outpatient Medications  Medication Sig Dispense Refill   acetaminophen (TYLENOL) 500 MG tablet Take 1,000 mg by mouth every 8 (eight) hours as needed for moderate pain.     amitriptyline (ELAVIL) 50 MG tablet Take 1 tablet (50 mg total) by mouth at bedtime. 90 tablet 1   amLODipine (NORVASC) 5 MG tablet Take 1 tablet (5 mg total) by mouth daily. 90 tablet 2   gabapentin (NEURONTIN) 300 MG capsule Take 2 capsules (600 mg total) by mouth 3 (three) times daily. (Patient taking differently: Take 900-1,200 mg by mouth at bedtime.) 270 capsule 1   losartan (COZAAR) 100 MG tablet Take 1 tablet (100 mg total) by mouth at bedtime. 90 tablet 1   Menthol, Topical Analgesic, (BIOFREEZE EX) Apply 1 application topically 4 (four) times daily as needed (back pain).     metoprolol succinate (TOPROL-XL) 25 MG 24 hr tablet TAKE 1 TABLET(25 MG) BY MOUTH DAILY (Patient taking differently: Take 25 mg by mouth daily.) 90 tablet 3   oxyCODONE-acetaminophen (PERCOCET)  10-325 MG tablet Take 1 tablet by mouth every 4 (four) hours as needed for pain. 150 tablet 0   [START ON 01/26/2022] oxyCODONE-acetaminophen (PERCOCET) 10-325 MG tablet Take 1 tablet by mouth every 4 (four) hours as needed for pain. 150 tablet 0   pantoprazole (PROTONIX) 40 MG tablet Take 40 mg by mouth at bedtime.     polyethylene glycol (MIRALAX / GLYCOLAX) packet Take 17 g by mouth daily as needed for mild constipation.      promethazine (PHENERGAN) 12.5 MG tablet TAKE 1 TABLET(12.5 MG) BY MOUTH EVERY 8 HOURS AS NEEDED FOR NAUSEA OR VOMITING 30 tablet 2   sucralfate (CARAFATE) 1 g tablet TAKE 1 TABLET BY MOUTH ONE HOUR BEFORE EACH MEAL AND TWO HOURS AFTER OTHER MEDICATIONS (Patient taking differently: Take 1 g by mouth See admin instructions. Take  1 tablet one hour before each meal and 2 hours after all other medications) 90 tablet 1   ondansetron (ZOFRAN) 8 MG tablet Take 1 tablet (8 mg total) by mouth every 8 (eight) hours as needed for nausea or vomiting. 30 tablet 1   prochlorperazine (COMPAZINE) 10 MG tablet Take 1 tablet (10 mg total) by mouth every 6 (six) hours as needed for nausea or vomiting. 30 tablet 1   No current facility-administered medications for this visit.   Facility-Administered Medications Ordered in Other Visits  Medication Dose Route Frequency Provider Last Rate Last Admin   epirubicin (ELLENCE) 50 mg in sodium chloride 0.9 % bladder instillation  50 mg Bladder Instillation Once [name], MD       epirubicin (ELLENCE) 80 mg in sodium chloride (PF) 0.9 % bladder instillation  80 mg Bladder Instillation Once Nyoka Cowden, Terri L, RPH       gemcitabine (GEMZAR) chemo syringe for bladder instillation 2,000 mg  2,000 mg Bladder Instillation Once [name], MD       gemcitabine [facility]) chemo syringe for bladder instillation 2,000 mg  2,000 mg Bladder Instillation Once [name], MD        OBJECTIVE: Vitals:   12/28/21 1410  BP: (!) 151/100  Pulse: 82   Resp: (!) 22  Temp: (!) 97.3 F (36.3 C)  SpO2: 100%     Body mass index is 17.94 kg/m.    ECOG FS:0 - Asymptomatic  General: Well-developed, well-nourished, no acute distress. Eyes: Pink conjunctiva, anicteric sclera. HEENT: Normocephalic, moist mucous membranes. Lungs: No audible wheezing or coughing. Heart: Regular rate and rhythm. Abdomen: Soft, nontender, no obvious distention. Musculoskeletal: No edema, cyanosis, or clubbing. Neuro: Alert, answering all questions appropriately. Cranial nerves grossly intact. Skin: No rashes or petechiae noted. Psych: Normal affect.   LAB RESULTS:  Lab Results  Component Value Date   NA 134 (L) 11/22/2021   K 4.6 11/22/2021   CL 101 11/22/2021   CO2 24 11/22/2021   GLUCOSE 103 (H) 11/22/2021   BUN 13 11/22/2021   CREATININE 0.45 11/22/2021   CALCIUM 9.5 11/22/2021   PROT 7.0 11/22/2021   ALBUMIN 4.2 11/22/2021   AST 33 11/22/2021   ALT 12 11/22/2021   ALKPHOS 84 11/22/2021   BILITOT 0.7 11/22/2021   GFRNONAA >60 11/22/2021   GFRAA >60 06/17/2019    Lab Results  Component Value Date   WBC 6.0 11/22/2021   NEUTROABS 3.5 11/22/2021   HGB 13.9 11/22/2021   HCT 41.5 11/22/2021   MCV 96.7 11/22/2021   PLT 158 11/22/2021     STUDIES: No results found.  ASSESSMENT: Minimally invasive bladder cancer.  PLAN:    1. Minimally invasive bladder cancer: Patient received intravesical gemcitabine approximately 2 years ago.  She now has minimally invasive disease into the lamina propria, but there was no muscularis propria on sample.  Discussion with urology confirmed that no repeat biopsy is planned until her next cystoscopy is scheduled in early December.  Despite recommendation for Keytruda every 3 weeks for up to 2 years, patient has declined and wishes to pursue intravesical gemcitabine weekly x6.  She did agree that she has persistent disease at her cystoscopy in December she would consider IV Keytruda at that time.  Return to  clinic in 1 week to initiate cycle 1 of 6 of weekly intravesical gemcitabine.   I spent a total of 30 minutes reviewing chart data, face-to-face evaluation with the patient, counseling and coordination of care as detailed  above.   Patient expressed understanding and was in agreement with this plan. She also understands that She can call clinic at any time with any questions, concerns, or complaints.    Cancer Staging  Bladder cancer [facility]) Staging form: Urinary Bladder, AJCC 7th Edition - Clinical stage from 03/19/2019: Stage I (T1, N0, M0) - Signed by Lloyd Huger, MD on 12/23/2021   Lloyd Huger, MD   12/29/2021 2:35 PM

## 2021-12-28 ENCOUNTER — Inpatient Hospital Stay (HOSPITAL_BASED_OUTPATIENT_CLINIC_OR_DEPARTMENT_OTHER): Payer: Medicare Other | Admitting: Oncology

## 2021-12-28 ENCOUNTER — Other Ambulatory Visit: Payer: Medicare Other

## 2021-12-28 ENCOUNTER — Encounter: Payer: Self-pay | Admitting: Oncology

## 2021-12-28 VITALS — BP 151/100 | HR 82 | Temp 97.3°F | Resp 22 | Ht 68.0 in

## 2021-12-28 DIAGNOSIS — C679 Malignant neoplasm of bladder, unspecified: Secondary | ICD-10-CM | POA: Diagnosis not present

## 2021-12-28 DIAGNOSIS — Z5111 Encounter for antineoplastic chemotherapy: Secondary | ICD-10-CM | POA: Diagnosis not present

## 2021-12-28 DIAGNOSIS — C678 Malignant neoplasm of overlapping sites of bladder: Secondary | ICD-10-CM

## 2021-12-28 DIAGNOSIS — F17210 Nicotine dependence, cigarettes, uncomplicated: Secondary | ICD-10-CM | POA: Diagnosis not present

## 2021-12-28 DIAGNOSIS — I5022 Chronic systolic (congestive) heart failure: Secondary | ICD-10-CM | POA: Diagnosis not present

## 2021-12-28 DIAGNOSIS — I110 Hypertensive heart disease with heart failure: Secondary | ICD-10-CM | POA: Diagnosis not present

## 2021-12-29 ENCOUNTER — Encounter: Payer: Self-pay | Admitting: Oncology

## 2021-12-29 MED ORDER — PROCHLORPERAZINE MALEATE 10 MG PO TABS: 10.0000 mg | ORAL_TABLET | Freq: Four times a day (QID) | ORAL | 1 refills | Status: DC | PRN

## 2021-12-29 MED ORDER — ONDANSETRON HCL 8 MG PO TABS: 8.0000 mg | ORAL_TABLET | Freq: Three times a day (TID) | ORAL | 1 refills | Status: DC | PRN

## 2021-12-30 ENCOUNTER — Ambulatory Visit: Payer: Medicare Other

## 2021-12-30 ENCOUNTER — Other Ambulatory Visit: Payer: Medicare Other

## 2021-12-30 ENCOUNTER — Ambulatory Visit: Payer: Medicare Other | Admitting: Oncology

## 2022-01-03 ENCOUNTER — Encounter: Payer: Self-pay | Admitting: Internal Medicine

## 2022-01-03 ENCOUNTER — Telehealth (INDEPENDENT_AMBULATORY_CARE_PROVIDER_SITE_OTHER): Payer: Medicare Other | Admitting: Internal Medicine

## 2022-01-03 DIAGNOSIS — J069 Acute upper respiratory infection, unspecified: Secondary | ICD-10-CM | POA: Diagnosis not present

## 2022-01-03 MED ORDER — CULTURELLE DIGESTIVE DAILY PRO PO CAPS: 1.0000 | ORAL_CAPSULE | Freq: Every day | ORAL | 0 refills | Status: DC

## 2022-01-03 MED ORDER — AZITHROMYCIN 500 MG PO TABS: 500.0000 mg | ORAL_TABLET | Freq: Every day | ORAL | 0 refills | Status: DC

## 2022-01-03 NOTE — Assessment & Plan Note (Signed)
COVID NEGATIVE X 1.  ENCOURAGED TO REPEAT THE TEST TOMORROW. Prescribing azithromycin 500 mg x 7 days due to history of COPD

## 2022-01-03 NOTE — Progress Notes (Signed)
Virtual Visit via [place]   Note    This format is felt to be most appropriate for this patient at this time.  All issues noted in this document were discussed and addressed.  No physical exam was performed (except for noted visual exam findings with Video Visits).   I connected with  [name] on 01/03/22 at  2:30 PM EDT by a video enabled telemedicine application or tand verified that I am speaking with the correct person using two identifiers. Location patient: home Location provider: work or home office Persons participating in the virtual visit: patient, provider  I discussed the limitations, risks, security and privacy concerns of performing an evaluation and management service by telephone and the availability of in person appointments. I also discussed with the patient that there may be a patient responsible charge related to this service. The patient expressed understanding and agreed to proceed.  Reason for visit: URI  HPI:  81 yr old female with COPD/tobacco abuse, recurrent bladder cancer with ongoing treatment,  presents with 3 day history of sore throat, malaise,  sinus congestion and cough productive of white sputum .  No fevers,  COVID NEGATIVE HOME TEST yesterday    ROS: See pertinent positives and negatives per HPI.  Past Medical History:  Diagnosis Date   AICD (automatic cardioverter/defibrillator) present EP cardiologist--- dr Caryl Comes    placement 05-07-2014 , ef 25%,  NICM/  (02-11-2019 last echo 08-31-2018 ef 60-65%)   Arthritis    "in about all my joints; for sure in my back"   Bladder cancer [facility]) urologist-  dr Junious Silk   dx 1995--  recurrent bladder cancer 2015 , s/p TURBT's and chemo instillation's ;   04/ 2019  s/p TURBT   Chronic hyponatremia    Chronic low back pain with bilateral sciatica    s/p  spinal cord stimulator @ [facility]  [phone]   Chronic systolic (congestive) heart failure [facility])    cardiologist-  dr Rogue Jury   COPD with emphysema ([place])     (02-11-2019  per pt has never been on oxygen)   Coronary artery disease cardiologist-  dr Kathlyn [place]   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% per cardiac cath 03-03-2014   DDD (degenerative disc disease), thoracolumbar    Frequent urination    Full dentures    Gait instability    due to chronic low back pain, uses roller walker   GERD (gastroesophageal reflux disease)    History of iron deficiency anemia 11/2013   resolved w/ IV Iron  (02-11-2019  per pt has not had any issues since 2015)   History of stomach ulcers 11/2013   Hypertension    LBBB (left bundle branch block)    NICM (nonischemic cardiomyopathy) ([place]) last echo 12-31-2015 ef 55-60%   dx 09/ 2015 per echo 20%;  myoview 09/ 2015 ef 28%;  per cardiac cath 12/ 2015 ef 30%;     Nocturia more than twice per night    S/P insertion of spinal cord stimulator followed by [facility]---- Dr Pecola Leisure (notes in care everywhere)   10-09-2018  '@[facility]'$ --- thoracic spinal cord stimular/ generator  (device from [facility])---- per pt has a control   Scoliosis    Uses walker 02/18/2021   all the time   Wears glasses    for reading    Past Surgical History:  Procedure Laterality Date   BI-VENTRICULAR IMPLANTABLE CARDIOVERTER DEFIBRILLATOR N/A 05/07/2014   Procedure: BI-VENTRICULAR IMPLANTABLE CARDIOVERTER DEFIBRILLATOR  (CRT-D);  Surgeon: Revonda Standard  Caryl Comes, MD;  Location: [facility] CATH LAB;  Service: Cardiovascular;  Laterality: N/A;   BIV PACEMAKER GENERATOR CHANGE OUT     CARDIAC CATHETERIZATION  03-03-2014  dr Kathlyn [place]   [facility]   pLAD 20%, pRCA 20%, dRCA 50%, RPLS 50%;  ef 30%, mild elevated LVEDP, mild gradient across aortic valve LVOT   CARDIOVASCULAR STRESS TEST  11/25/2013   High risk nuclear study w/ large high severity inferior wall perfusion defect on stress and rest images, large mild severity anteroseptal wall perfusion defect on stress and rest images, No inducible ischemia/ global moderate hypokinesis, ef 28%   CATARACT EXTRACTION W/  INTRAOCULAR LENS  IMPLANT, BILATERAL Bilateral right 12-2013 / left  02-2014   colonscopy  11/2020   benign polyps removed   CYSTOSCOPY W/ RETROGRADES Bilateral 01/06/2015   Procedure: CYSTOSCOPY WITH  BLADDER BIOPSY BILATERAL RETROGRADE PYELOGRAM,INSTILLATION OF MITOMYCIN C;  Surgeon: [name], MD;  Location: [facility];  Service: Urology;  Laterality: Bilateral;   CYSTOSCOPY WITH BIOPSY N/A 10/06/2015   Procedure: CYSTO WITH BLADDER BIOPSY, FULGERATION, CHEMO IRRIGATION EPIRUBICIN IN PACU;  Surgeon: [name], MD;  Location: [facility];  Service: Urology;  Laterality: N/A;   CYSTOSCOPY WITH BIOPSY N/A 02/12/2019   Procedure: CYSTOSCOPY WITH BIOPSY/ FULGURATION/ INSTILLATION OF GEMCITABINE, bilateral retrograde turbt greater 5cm;  Surgeon: [name], MD;  Location: [facility];  Service: Urology;  Laterality: N/A;   CYSTOSCOPY WITH BIOPSY N/A 03/19/2021   Procedure: CYSTOSCOPY WITH TRANSURETHRAL RESECTION OF BLADDER TUMOR  GREATER THAN 5CM WITH POST-OPERATIVE INSTILLATION OF EPIRUBICIN;  Surgeon: [name], MD;  Location: [facility];  Service: Urology;  Laterality: N/A;   CYSTOSCOPY WITH FULGERATION N/A 06/30/2017   Procedure: Marland Kitchen CYSTOSCOPY WITH [place] FULGERATION/ BLADDER BIOPSY/ INSTILLATION OF EPIRUBICIN;  Surgeon: [name], MD;  Location: [facility];  Service: Urology;  Laterality: N/A;   CYSTOSCOPY WITH RETROGRADE PYELOGRAM, URETEROSCOPY AND STENT PLACEMENT Bilateral 04/18/2014   Procedure: CYSTOSCOPY WITH RETROGRADE PYELOGRAM;  Surgeon: [name], MD;  Location: [facility];  Service: Urology;  Laterality: Bilateral;   ESOPHAGOGASTRODUODENOSCOPY N/A 11/28/2013   Procedure: ESOPHAGOGASTRODUODENOSCOPY (EGD);  Surgeon: Arta Silence, MD;  Location: [facility] ENDOSCOPY;  Service: Endoscopy;  Laterality: N/A;   ICD GENERATOR CHANGEOUT N/A 11/04/2021   Procedure: ICD GENERATOR CHANGEOUT;  Surgeon: Deboraha Sprang, MD;  Location: [place] CV LAB;  Service: Cardiovascular;  Laterality: N/A;   LUMBAR DISC SURGERY  1980's   "ruptured disc"   SPINAL CORD STIMULATOR IMPLANT  10-09-2018   '@[facility]'$    Thoracic spinal cord stimular/ genertor  (left flank)----- (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[employer])   TRANSTHORACIC ECHOCARDIOGRAM  12/31/2015   dr Caryl Comes   ef 63-785, grade 1 diastolic dysfunction/ mild MR/ septal motion showed abnormal function and dyssynergy   TRANSURETHRAL RESECTION OF BLADDER  1995   TRANSURETHRAL RESECTION OF BLADDER TUMOR N/A 12/13/2013   Procedure: TRANSURETHRAL RESECTION OF BLADDER TUMOR (TURBT);  Surgeon: [name], MD;  [facility];  Service: Urology;  Laterality: N/A;   TRANSURETHRAL RESECTION OF BLADDER TUMOR N/A 01/17/2014   Procedure: TRANSURETHRAL RESECTION OF BLADDER TUMOR (TURBT);  Surgeon: [name], MD;  [facility];  Service: Urology;  Laterality: N/A;   TRANSURETHRAL RESECTION OF BLADDER TUMOR N/A 04/18/2014   Procedure: TRANSURETHRAL RESECTION OF BLADDER TUMOR (TURBT), CYSTOGRAM;  Surgeon: [name], MD;  Location[facility];  Service: Urology;  Laterality: N/A;   TRANSURETHRAL RESECTION OF BLADDER TUMOR N/A 12/13/2019   Procedure: TRANSURETHRAL RESECTION OF BLADDER TUMOR (TURBT) GREATER  THAN 5CM WITH CYSTOSCOPY/ POST OPERATIVE INSTILLATION OF GEMCITABINE;  Surgeon: [name], MD;  Location: [facility];  Service: Urology;  Laterality: N/A;   TRANSURETHRAL RESECTION OF BLADDER TUMOR N/A 10/06/2020   Procedure: TRANSURETHRAL RESECTION OF BLADDER TUMOR (TURBT)/ CYSTOSCOPY/  BLADDER BIOPSY;  Surgeon: [name], MD;  [facility];  Service: Urology;  Laterality: N/A;   TRANSURETHRAL RESECTION OF BLADDER TUMOR N/A 11/30/2021   Procedure: TRANSURETHRAL RESECTION OF BLADDER TUMOR (TURBT) WITH POST OPERATIVE INSTILLATION OF GEMCITABINE;  Surgeon: [name], MD;  [facility];  Service: Urology;  Laterality: N/A;    Family History  Problem  Relation Age of Onset   Cancer Mother    Cancer Brother     SOCIAL HX:  reports that she has been smoking cigarettes. She has a 28.00 pack-year smoking history. She has never used smokeless tobacco. She reports that she does not drink alcohol and does not use drugs.    Current Outpatient Medications:    acetaminophen (TYLENOL) 500 MG tablet, Take 1,000 mg by mouth every 8 (eight) hours as needed for moderate pain., Disp: , Rfl:    amitriptyline (ELAVIL) 50 MG tablet, Take 1 tablet (50 mg total) by mouth at bedtime., Disp: 90 tablet, Rfl: 1   amLODipine (NORVASC) 5 MG tablet, Take 1 tablet (5 mg total) by mouth daily., Disp: 90 tablet, Rfl: 2   azithromycin (ZITHROMAX) 500 MG tablet, Take 1 tablet (500 mg total) by mouth daily., Disp: 7 tablet, Rfl: 0   gabapentin (NEURONTIN) 300 MG capsule, Take 2 capsules (600 mg total) by mouth 3 (three) times daily. (Patient taking differently: Take 900-1,200 mg by mouth at bedtime.), Disp: 270 capsule, Rfl: 1   Lactobacillus-Inulin (CULTURELLE DIGESTIVE DAILY PRO) CAPS, Take 1 capsule by mouth daily., Disp: 30 capsule, Rfl: 0   losartan (COZAAR) 100 MG tablet, Take 1 tablet (100 mg total) by mouth at bedtime., Disp: 90 tablet, Rfl: 1   Menthol, Topical Analgesic, (BIOFREEZE EX), Apply 1 application topically 4 (four) times daily as needed (back pain)., Disp: , Rfl:    metoprolol succinate (TOPROL-XL) 25 MG 24 hr tablet, TAKE 1 TABLET(25 MG) BY MOUTH DAILY (Patient taking differently: Take 25 mg by mouth daily.), Disp: 90 tablet, Rfl: 3   ondansetron (ZOFRAN) 8 MG tablet, Take 1 tablet (8 mg total) by mouth every 8 (eight) hours as needed for nausea or vomiting., Disp: 30 tablet, Rfl: 1   oxyCODONE-acetaminophen (PERCOCET) 10-325 MG tablet, Take 1 tablet by mouth every 4 (four) hours as needed for pain., Disp: 150 tablet, Rfl: 0   [START ON 01/26/2022] oxyCODONE-acetaminophen (PERCOCET) 10-325 MG tablet, Take 1 tablet by mouth every 4 (four) hours as needed for  pain., Disp: 150 tablet, Rfl: 0   pantoprazole (PROTONIX) 40 MG tablet, Take 40 mg by mouth at bedtime., Disp: , Rfl:    polyethylene glycol (MIRALAX / GLYCOLAX) packet, Take 17 g by mouth daily as needed for mild constipation. , Disp: , Rfl:    prochlorperazine (COMPAZINE) 10 MG tablet, Take 1 tablet (10 mg total) by mouth every 6 (six) hours as needed for nausea or vomiting., Disp: 30 tablet, Rfl: 1   promethazine (PHENERGAN) 12.5 MG tablet, TAKE 1 TABLET(12.5 MG) BY MOUTH EVERY 8 HOURS AS NEEDED FOR NAUSEA OR VOMITING, Disp: 30 tablet, Rfl: 2   sucralfate (CARAFATE) 1 g tablet, TAKE 1 TABLET BY MOUTH ONE HOUR BEFORE EACH MEAL AND TWO HOURS AFTER OTHER MEDICATIONS (Patient taking differently: Take 1 g  by mouth See admin instructions. Take 1 tablet one hour before each meal and 2 hours after all other medications), Disp: 90 tablet, Rfl: 1 No current facility-administered medications for this visit.  Facility-Administered Medications Ordered in Other Visits:    epirubicin (ELLENCE) 50 mg in sodium chloride 0.9 % bladder instillation, 50 mg, Bladder Instillation, Once, [name], MD   epirubicin (ELLENCE) 80 mg in sodium chloride (PF) 0.9 % bladder instillation, 80 mg, Bladder Instillation, Once, Green, Terri L, RPH   gemcitabine (GEMZAR) chemo syringe for bladder instillation 2,000 mg, 2,000 mg, Bladder Instillation, Once, [name], MD   gemcitabine [facility]) chemo syringe for bladder instillation 2,000 mg, 2,000 mg, Bladder Instillation, Once, [name], MD  EXAMTonette Bihari per patient if applicable:  GENERAL: alert, oriented, appears well and in no acute distress  HEENT: atraumatic, conjunttiva clear, no obvious abnormalities on inspection of external nose and ears  NECK: normal movements of the head and neck  LUNGS: on inspection no signs of respiratory distress, breathing rate appears normal, no obvious gross SOB, gasping or wheezing  CV: no obvious cyanosis  MS:  moves all visible extremities without noticeable abnormality  PSYCH/NEURO: pleasant and cooperative, no obvious depression or anxiety, speech and thought processing grossly intact  ASSESSMENT AND PLAN:  Discussed the following assessment and plan:  Viral URI with cough  Viral URI with cough COVID NEGATIVE X 1.  ENCOURAGED TO REPEAT THE TEST TOMORROW. Prescribing azithromycin 500 mg x 7 days due to history of COPD    I discussed the assessment and treatment plan with the patient. The patient was provided an opportunity to ask questions and all were answered. The patient agreed with the plan and demonstrated an understanding of the instructions.   The patient was advised to call back or seek an in-person evaluation if the symptoms worsen or if the condition fails to improve as anticipated.   I spent 20 minutes dedicated to the care of this patient on the date of this encounter to include pre-visit review of her medical history,  Face-to-face time with the patient , and post visit ordering of testing and therapeutics.    [name], MD

## 2022-01-03 NOTE — Patient Instructions (Signed)
You may have a Lower respiratory infection.  This was caused by a virus.  , but it  can turn into a COPD exacerbation   I am prescribing azithromycin once daily for 7 days   Daily use of a probiotic advised for 3 weeks to prevent a serious diarrhea  PLEASE RETEST FOR COVID [facility]

## 2022-01-04 ENCOUNTER — Other Ambulatory Visit: Payer: Self-pay | Admitting: Oncology

## 2022-01-05 ENCOUNTER — Encounter: Payer: Self-pay | Admitting: Oncology

## 2022-01-05 ENCOUNTER — Inpatient Hospital Stay: Payer: Medicare Other

## 2022-01-06 ENCOUNTER — Other Ambulatory Visit: Payer: Medicare Other | Admitting: Student

## 2022-01-06 DIAGNOSIS — Z515 Encounter for palliative care: Secondary | ICD-10-CM

## 2022-01-06 DIAGNOSIS — K6289 Other specified diseases of anus and rectum: Secondary | ICD-10-CM | POA: Diagnosis not present

## 2022-01-06 DIAGNOSIS — M549 Dorsalgia, unspecified: Secondary | ICD-10-CM | POA: Diagnosis not present

## 2022-01-06 DIAGNOSIS — G8929 Other chronic pain: Secondary | ICD-10-CM

## 2022-01-06 DIAGNOSIS — C678 Malignant neoplasm of overlapping sites of bladder: Secondary | ICD-10-CM | POA: Diagnosis not present

## 2022-01-06 DIAGNOSIS — R29898 Other symptoms and signs involving the musculoskeletal system: Secondary | ICD-10-CM | POA: Diagnosis not present

## 2022-01-06 DIAGNOSIS — R630 Anorexia: Secondary | ICD-10-CM

## 2022-01-06 NOTE — Progress Notes (Signed)
[facility] Consult Note Telephone: [phone]  Fax: [phone]    Date of encounter: 01/06/22 3:08 PM PATIENT NAME: [name] [id number]   [phone] (home) [phone] (work) DOB: December 11, 1940 MRN: [id number] PRIMARY CARE PROVIDER:    [name], MD,  [place] [address] [phone]  REFERRING PROVIDER:   [name], MD [address] [place],  [place] [address] [phone]  RESPONSIBLE PARTY:    Contact Information     Name Relation Home Work Benkelman Daughter [phone]  [phone]   Nile Dear [phone]  [phone]   Mervin Kung Daughter   [phone]   Quincy Simmonds Daughter [phone]  [phone]   Fringer,Eddie Pandora Leiter   [phone]   Kensli, Bowley   [phone]   Hoke,Felicia Daughter   [phone]        I met face to face with patient and caregiver in the home. Palliative Care was asked to follow this patient by consultation request of  [name], MD to address advance care planning and complex medical decision making. This is a follow up visit.                                   ASSESSMENT AND PLAN / RECOMMENDATIONS:   Advance Care Planning/Goals of Care: Goals include to maximize quality of life and symptom management. Patient/health care surrogate gave his/her permission to discuss. Our advance care planning conversation included a discussion about:    The value and importance of advance care planning  Experiences with loved ones who have been seriously ill or have died  Exploration of personal, cultural or spiritual beliefs that might influence medical decisions  Exploration of goals of care in the event of a sudden injury or illness  Review and updating or creation of an  advance directive document . CODE STATUS: TBD  Education provided on Palliative Medicine. We reviewed  MOST form today; patient states she did not want to complete today due to not feeling well. She did express not wanting any life sustaining measures or to be on machines.  Symptom Management/Plan:  Rectal pain-patient states there has not been much improvement in rectal pain. She continues on Elavil 50 mg daily; she declines to have dose increased. Continue gabapentin 300 mg 3-4 tablets QHS. Continue acetaminophen alternating with ibuprofen PRN. We discussed PT referral for pelvic floor physical therapy to help with rectal pain; she is in agreement. Will follow up with PCP. We discussed hemorrhoidal cream with lidocaine PRN.  Bladder cancer- patient has upcoming appointments for chemotherapy;  cystocopy as scheduled. She is to follow up with oncology and urology as scheduled.   LE weakness-patient with BLE weakness; she is interested in therapy. Will follow up with PCP regarding request.   Chronic back pain-continue Percocet 10-325 mg every 4 hours PRN. Managed by pain clinic. We discussed attempting to slowly deescalate medication once her rectal pain is managed given opioid-induced hyperalgesia.    Appetite-patient with varied appetite. Encourage foods patient enjoys, nutritional supplements.     Follow up Palliative Care Visit: Palliative care will continue to follow for complex medical decision making, advance care planning, and clarification of goals. Return 4-6 weeks or prn.  This visit was coded based on medical decision making (MDM).  PPS: 50%  HOSPICE ELIGIBILITY/DIAGNOSIS: TBD  Chief Complaint: Palliative Medicine  follow up visit.   HISTORY OF PRESENT ILLNESS:  [name] is a 81 y.o. year old female  with bladder cancer, rectal pain, chronic back pain.   Patient states there has not been much improvement in rectal pain. She continues on oxycodone-acetaminophen for chronic back pain. She is currently on Zithromax as she has viral URI and COPD. She denies worsening cough,  shortness of breath, fever or chills. She was tested for Covid with home test, test negative. Her appetite varies. Weight varies between 111-120 pounds. She does endorses increased weakness to BLE. No falls reported.   History obtained from review of EMR, discussion with primary team, and interview with family, facility staff/caregiver and/or [name].  I reviewed available labs, medications, imaging, studies and related documents from the EMR.  Records reviewed and summarized above.   ROS  A 10-Point ROS is negative, except for the pertinent positives and negatives detailed per the HPI.  Physical Exam: Pulse 68, resp 20, b/p 142/100, sats 96% on room air.  Constitutional: NAD General: frail appearing, thin EYES: anicteric sclera, lids intact, no discharge  ENMT: intact hearing, oral mucous membranes moist, dentition intact CV: S1S2, RRR, no LE edema Pulmonary: LCTA, no increased work of breathing, no cough, room air Abdomen: intake 100%, normo-active BS + 4 quadrants, soft and non tender, no ascites GU: deferred MSK:  sarcopenia, moves all extremities, ambulatory Skin: warm and dry, no rashes or wounds on visible skin Neuro: + generalized weakness,  no cognitive impairment Psych: non-anxious affect, A and O x 3 Hem/lymph/immuno: no widespread bruising   Thank you for the opportunity to participate in the care of [name].  The palliative care team will continue to follow. Please call our office at [phone] if we can be of additional assistance.   Ezekiel Slocumb, NP   COVID-19 PATIENT SCREENING TOOL Asked and negative response unless otherwise noted:   Have you had symptoms of covid, tested positive or been in contact with someone with symptoms/positive test in the past 5-10 days? No

## 2022-01-10 ENCOUNTER — Inpatient Hospital Stay: Payer: Medicare Other

## 2022-01-10 ENCOUNTER — Telehealth: Payer: Self-pay

## 2022-01-10 VITALS — BP 125/87 | HR 74 | Temp 96.7°F | Resp 18 | Wt 116.3 lb

## 2022-01-10 DIAGNOSIS — C678 Malignant neoplasm of overlapping sites of bladder: Secondary | ICD-10-CM

## 2022-01-10 DIAGNOSIS — I110 Hypertensive heart disease with heart failure: Secondary | ICD-10-CM | POA: Diagnosis not present

## 2022-01-10 DIAGNOSIS — I5022 Chronic systolic (congestive) heart failure: Secondary | ICD-10-CM | POA: Diagnosis not present

## 2022-01-10 DIAGNOSIS — C679 Malignant neoplasm of bladder, unspecified: Secondary | ICD-10-CM | POA: Diagnosis not present

## 2022-01-10 DIAGNOSIS — Z5111 Encounter for antineoplastic chemotherapy: Secondary | ICD-10-CM | POA: Diagnosis not present

## 2022-01-10 DIAGNOSIS — F17210 Nicotine dependence, cigarettes, uncomplicated: Secondary | ICD-10-CM | POA: Diagnosis not present

## 2022-01-10 LAB — CBC WITH DIFFERENTIAL/PLATELET
Abs Immature Granulocytes: 0.01 10*3/uL (ref 0.00–0.07)
Basophils Absolute: 0.1 10*3/uL (ref 0.0–0.1)
Basophils Relative: 1 %
Eosinophils Absolute: 0.2 10*3/uL (ref 0.0–0.5)
Eosinophils Relative: 3 %
HCT: 40.5 % (ref 36.0–46.0)
Hemoglobin: 13.5 g/dL (ref 12.0–15.0)
Immature Granulocytes: 0 %
Lymphocytes Relative: 30 %
Lymphs Abs: 2.1 10*3/uL (ref 0.7–4.0)
MCH: 31.7 pg (ref 26.0–34.0)
MCHC: 33.3 g/dL (ref 30.0–36.0)
MCV: 95.1 fL (ref 80.0–100.0)
Monocytes Absolute: 0.9 10*3/uL (ref 0.1–1.0)
Monocytes Relative: 13 %
Neutro Abs: 3.7 10*3/uL (ref 1.7–7.7)
Neutrophils Relative %: 53 %
Platelets: 217 10*3/uL (ref 150–400)
RBC: 4.26 MIL/uL (ref 3.87–5.11)
RDW: 13 % (ref 11.5–15.5)
WBC: 7 10*3/uL (ref 4.0–10.5)
nRBC: 0 % (ref 0.0–0.2)

## 2022-01-10 LAB — COMPREHENSIVE METABOLIC PANEL
ALT: 19 U/L (ref 0–44)
AST: 27 U/L (ref 15–41)
Albumin: 4.1 g/dL (ref 3.5–5.0)
Alkaline Phosphatase: 109 U/L (ref 38–126)
Anion gap: 8 (ref 5–15)
BUN: 17 mg/dL (ref 8–23)
CO2: 28 mmol/L (ref 22–32)
Calcium: 9.4 mg/dL (ref 8.9–10.3)
Chloride: 95 mmol/L — ABNORMAL LOW (ref 98–111)
Creatinine, Ser: 0.71 mg/dL (ref 0.44–1.00)
GFR, Estimated: >60 mL/min (ref >60)
Glucose, Bld: 121 mg/dL — ABNORMAL HIGH (ref 70–99)
Potassium: 4.3 mmol/L (ref 3.5–5.1)
Sodium: 131 mmol/L — ABNORMAL LOW (ref 135–145)
Total Bilirubin: 0.4 mg/dL (ref 0.3–1.2)
Total Protein: 7.5 g/dL (ref 6.5–8.1)

## 2022-01-10 LAB — URINALYSIS, COMPLETE (UACMP) WITH MICROSCOPIC
Bacteria, UA: NONE SEEN
Bilirubin Urine: NEGATIVE
Glucose, UA: NEGATIVE mg/dL
Ketones, ur: NEGATIVE mg/dL
Nitrite: NEGATIVE
Protein, ur: NEGATIVE mg/dL
Specific Gravity, Urine: 1.008 (ref 1.005–1.030)
pH: 7 (ref 5.0–8.0)

## 2022-01-10 MED ORDER — PROCHLORPERAZINE MALEATE 10 MG PO TABS
10.0000 mg | ORAL_TABLET | Freq: Once | ORAL | Status: AC
  Administered 2022-01-10: 10 mg via ORAL
  Filled 2022-01-10: qty 1

## 2022-01-10 MED ORDER — LIDOCAINE HCL URETHRAL/MUCOSAL 2 % EX GEL: 1.0000 | Freq: Once | CUTANEOUS | Status: DC

## 2022-01-10 MED ORDER — OXYBUTYNIN CHLORIDE 5 MG PO TABS
5.0000 mg | ORAL_TABLET | Freq: Once | ORAL | Status: AC | PRN
  Administered 2022-01-10: 5 mg via ORAL
  Filled 2022-01-10: qty 1

## 2022-01-10 MED ORDER — GEMCITABINE CHEMO FOR BLADDER INSTILLATION 2000 MG
2000.0000 mg | Freq: Once | INTRAVENOUS | Status: AC
  Administered 2022-01-10: 2000 mg via INTRAVESICAL
  Filled 2022-01-10: qty 52.6

## 2022-01-10 NOTE — Telephone Encounter (Signed)
While patient was being seen for treatment she asked if she can have something prescribed to help with her " having to pee all the time". Please advise.

## 2022-01-10 NOTE — Patient Instructions (Signed)
[facility]  Discharge Instructions: Thank you for choosing [facility] to provide your oncology and hematology care.  If you have a lab appointment with the Cancer Center, please go directly to the Cancer Center and check in at the registration area.  Wear comfortable clothing and clothing appropriate for easy access to any Portacath or PICC line.   We strive to give you quality time with your provider. You may need to reschedule your appointment if you arrive late (15 or more minutes).  Arriving late affects you and other patients whose appointments are after yours.  Also, if you miss three or more appointments without notifying the office, you may be dismissed from the clinic at the provider's discretion.      For prescription refill requests, have your pharmacy contact our office and allow 72 hours for refills to be completed.    Today you received the following chemotherapy and/or immunotherapy agents Gemzar      To help prevent nausea and vomiting after your treatment, we encourage you to take your nausea medication as directed.  BELOW ARE SYMPTOMS THAT SHOULD BE REPORTED IMMEDIATELY: *FEVER GREATER THAN 100.4 F (38 C) OR HIGHER *CHILLS OR SWEATING *NAUSEA AND VOMITING THAT IS NOT CONTROLLED WITH YOUR NAUSEA MEDICATION *UNUSUAL SHORTNESS OF BREATH *UNUSUAL BRUISING OR BLEEDING *URINARY PROBLEMS (pain or burning when urinating, or frequent urination) *BOWEL PROBLEMS (unusual diarrhea, constipation, pain near the anus) TENDERNESS IN MOUTH AND THROAT WITH OR WITHOUT PRESENCE OF ULCERS (sore throat, sores in mouth, or a toothache) UNUSUAL RASH, SWELLING OR PAIN  UNUSUAL VAGINAL DISCHARGE OR ITCHING   Items with * indicate a potential emergency and should be followed up as soon as possible or go to the Emergency Department if any problems should occur.  Please show the CHEMOTHERAPY ALERT CARD or IMMUNOTHERAPY ALERT CARD at check-in to the  Emergency Department and triage nurse.  Should you have questions after your visit or need to cancel or reschedule your appointment, please contact [facility]  [phone] and follow the prompts.  Office hours are 8:00 a.m. to 4:30 p.m. Monday - Friday. Please note that voicemails left after 4:00 p.m. may not be returned until the following business day.  We are closed weekends and major holidays. You have access to a nurse at all times for urgent questions. Please call the main number to the clinic [phone] and follow the prompts.  For any non-urgent questions, you may also contact your provider using MyChart. We now offer e-Visits for anyone 18 and older to request care online for non-urgent symptoms. For details visit [email or web address].   Also download the MyChart app! Go to the app store, search "MyChart", open the app, select [facility], and log in with your MyChart username and password.  Masks are optional in the cancer centers. If you would like for your care team to wear a mask while they are taking care of you, please let them know. For doctor visits, patients may have with them one support person who is at least 82 years old. At this time, visitors are not allowed in the infusion area.   

## 2022-01-10 NOTE — Progress Notes (Unsigned)
0955: UA showed "small amount of Hgb in urine and "trace" amounts of Leukocytes. Per Dr. Grayland Ormond okay to proceed with treatment.   0959: Oxybutynin given, per pt reports of spasms during previous treatments.   1040: foley inserted.  Pt tolerated well. 1046: Gemzar instilled and foley clamped.    1130: Pt reports she can not tolerate the spasms and requests that instead of un-clamping and draining bladder completely that she would like only a "small amount drained". 25cc drained and then foley clamped.  1134: Pt requests that foley be unclamped an drained due to spasms and requests for MD to give her something for frequent urination at home.  MD aware, no further orders for treatment nurse at this time. Foley unclamped per pt request.   Per Pharmacy B&O suppositories are no available in clinic.    1145: Foley removed and pt tolerated well.  1155: Pt stable at discharge.

## 2022-01-11 ENCOUNTER — Encounter: Payer: Self-pay | Admitting: Oncology

## 2022-01-12 ENCOUNTER — Other Ambulatory Visit: Payer: Medicare Other

## 2022-01-12 ENCOUNTER — Ambulatory Visit: Payer: Medicare Other

## 2022-01-14 ENCOUNTER — Telehealth: Payer: Self-pay

## 2022-01-14 DIAGNOSIS — K5902 Outlet dysfunction constipation: Secondary | ICD-10-CM

## 2022-01-14 NOTE — Telephone Encounter (Signed)
855 am.  Request received from [facility], NP to contact PCP office to request PT orders for pelvic floor PT and strengthening for generalized weakness.  Phone call made to PCP office and message left with [facility].

## 2022-01-14 NOTE — Telephone Encounter (Signed)
Almyra Free from [facility] called to state their nurse practitioner, Phineas Real, saw patient last week and she is requesting orders for physical therapy for patient's pelvic floor pain.  Almyra Free states Phineas Real states that this is typically outpatient.

## 2022-01-17 ENCOUNTER — Inpatient Hospital Stay: Payer: Medicare Other

## 2022-01-17 VITALS — BP 149/93 | HR 81 | Temp 97.5°F | Wt 117.1 lb

## 2022-01-17 DIAGNOSIS — Z5111 Encounter for antineoplastic chemotherapy: Secondary | ICD-10-CM | POA: Diagnosis not present

## 2022-01-17 DIAGNOSIS — C679 Malignant neoplasm of bladder, unspecified: Secondary | ICD-10-CM | POA: Diagnosis not present

## 2022-01-17 DIAGNOSIS — I110 Hypertensive heart disease with heart failure: Secondary | ICD-10-CM | POA: Diagnosis not present

## 2022-01-17 DIAGNOSIS — F17210 Nicotine dependence, cigarettes, uncomplicated: Secondary | ICD-10-CM | POA: Diagnosis not present

## 2022-01-17 DIAGNOSIS — C678 Malignant neoplasm of overlapping sites of bladder: Secondary | ICD-10-CM

## 2022-01-17 DIAGNOSIS — I5022 Chronic systolic (congestive) heart failure: Secondary | ICD-10-CM | POA: Diagnosis not present

## 2022-01-17 LAB — COMPREHENSIVE METABOLIC PANEL
ALT: 20 U/L (ref 0–44)
AST: 30 U/L (ref 15–41)
Albumin: 4.2 g/dL (ref 3.5–5.0)
Alkaline Phosphatase: 103 U/L (ref 38–126)
Anion gap: 5 (ref 5–15)
BUN: 17 mg/dL (ref 8–23)
CO2: 27 mmol/L (ref 22–32)
Calcium: 9.1 mg/dL (ref 8.9–10.3)
Chloride: 97 mmol/L — ABNORMAL LOW (ref 98–111)
Creatinine, Ser: 0.65 mg/dL (ref 0.44–1.00)
GFR, Estimated: >60 mL/min (ref >60)
Glucose, Bld: 100 mg/dL — ABNORMAL HIGH (ref 70–99)
Potassium: 4.6 mmol/L (ref 3.5–5.1)
Sodium: 129 mmol/L — ABNORMAL LOW (ref 135–145)
Total Bilirubin: 0.3 mg/dL (ref 0.3–1.2)
Total Protein: 7.3 g/dL (ref 6.5–8.1)

## 2022-01-17 LAB — CBC WITH DIFFERENTIAL/PLATELET
Abs Immature Granulocytes: 0.01 10*3/uL (ref 0.00–0.07)
Basophils Absolute: 0.1 10*3/uL (ref 0.0–0.1)
Basophils Relative: 1 %
Eosinophils Absolute: 0.1 10*3/uL (ref 0.0–0.5)
Eosinophils Relative: 2 %
HCT: 38.1 % (ref 36.0–46.0)
Hemoglobin: 12.7 g/dL (ref 12.0–15.0)
Immature Granulocytes: 0 %
Lymphocytes Relative: 36 %
Lymphs Abs: 1.9 10*3/uL (ref 0.7–4.0)
MCH: 31.9 pg (ref 26.0–34.0)
MCHC: 33.3 g/dL (ref 30.0–36.0)
MCV: 95.7 fL (ref 80.0–100.0)
Monocytes Absolute: 0.5 10*3/uL (ref 0.1–1.0)
Monocytes Relative: 10 %
Neutro Abs: 2.7 10*3/uL (ref 1.7–7.7)
Neutrophils Relative %: 51 %
Platelets: 178 10*3/uL (ref 150–400)
RBC: 3.98 MIL/uL (ref 3.87–5.11)
RDW: 12.9 % (ref 11.5–15.5)
WBC: 5.2 10*3/uL (ref 4.0–10.5)
nRBC: 0 % (ref 0.0–0.2)

## 2022-01-17 MED ORDER — OXYBUTYNIN CHLORIDE 5 MG PO TABS
5.0000 mg | ORAL_TABLET | Freq: Once | ORAL | Status: AC | PRN
  Administered 2022-01-17: 5 mg via ORAL
  Filled 2022-01-17: qty 1

## 2022-01-17 MED ORDER — PROCHLORPERAZINE MALEATE 10 MG PO TABS
10.0000 mg | ORAL_TABLET | Freq: Once | ORAL | Status: AC
  Administered 2022-01-17: 10 mg via ORAL
  Filled 2022-01-17: qty 1

## 2022-01-17 MED ORDER — GEMCITABINE CHEMO FOR BLADDER INSTILLATION 2000 MG
2000.0000 mg | Freq: Once | INTRAVENOUS | Status: AC
  Administered 2022-01-17: 2000 mg via INTRAVESICAL
  Filled 2022-01-17: qty 52.6

## 2022-01-17 NOTE — Progress Notes (Signed)
Pt urinated prior to arrival to clinic. Unable to provide specimen at this time. Per Dr. Grayland Ormond, ok to proceed without urine

## 2022-01-17 NOTE — Patient Instructions (Signed)
[facility]  Discharge Instructions: Thank you for choosing [facility] to provide your oncology and hematology care.  If you have a lab appointment with the Cancer Center, please go directly to the Cancer Center and check in at the registration area.  Wear comfortable clothing and clothing appropriate for easy access to any Portacath or PICC line.   We strive to give you quality time with your provider. You may need to reschedule your appointment if you arrive late (15 or more minutes).  Arriving late affects you and other patients whose appointments are after yours.  Also, if you miss three or more appointments without notifying the office, you may be dismissed from the clinic at the provider's discretion.      For prescription refill requests, have your pharmacy contact our office and allow 72 hours for refills to be completed.    Today you received the following chemotherapy and/or immunotherapy agents Gemzar      To help prevent nausea and vomiting after your treatment, we encourage you to take your nausea medication as directed.  BELOW ARE SYMPTOMS THAT SHOULD BE REPORTED IMMEDIATELY: *FEVER GREATER THAN 100.4 F (38 C) OR HIGHER *CHILLS OR SWEATING *NAUSEA AND VOMITING THAT IS NOT CONTROLLED WITH YOUR NAUSEA MEDICATION *UNUSUAL SHORTNESS OF BREATH *UNUSUAL BRUISING OR BLEEDING *URINARY PROBLEMS (pain or burning when urinating, or frequent urination) *BOWEL PROBLEMS (unusual diarrhea, constipation, pain near the anus) TENDERNESS IN MOUTH AND THROAT WITH OR WITHOUT PRESENCE OF ULCERS (sore throat, sores in mouth, or a toothache) UNUSUAL RASH, SWELLING OR PAIN  UNUSUAL VAGINAL DISCHARGE OR ITCHING   Items with * indicate a potential emergency and should be followed up as soon as possible or go to the Emergency Department if any problems should occur.  Please show the CHEMOTHERAPY ALERT CARD or IMMUNOTHERAPY ALERT CARD at check-in to the  Emergency Department and triage nurse.  Should you have questions after your visit or need to cancel or reschedule your appointment, please contact [facility] [place]  [phone] and follow the prompts.  Office hours are 8:00 a.m. to 4:30 p.m. Monday - Friday. Please note that voicemails left after 4:00 p.m. may not be returned until the following business day.  We are closed weekends and major holidays. You have access to a nurse at all times for urgent questions. Please call the main number to the clinic [phone] and follow the prompts.  For any non-urgent questions, you may also contact your provider using MyChart. We now offer e-Visits for anyone 18 and older to request care online for non-urgent symptoms. For details visit [email or web address].   Also download the MyChart app! Go to the app store, search "MyChart", open the app, select [place], and log in with your MyChart username and password.  Masks are optional in the cancer centers. If you would like for your care team to wear a mask while they are taking care of you, please let them know. For doctor visits, patients may have with them one support person who is at least 82 years old. At this time, visitors are not allowed in the infusion area.   

## 2022-01-17 NOTE — Telephone Encounter (Signed)
Noted and Palliative Care is aware.

## 2022-01-17 NOTE — Telephone Encounter (Signed)
Spoke with [name] and stated that we would need to submit a referral for physical therapy. I have pended the referral but could not find the right dx code.

## 2022-01-19 ENCOUNTER — Ambulatory Visit: Payer: Medicare Other

## 2022-01-19 ENCOUNTER — Ambulatory Visit: Payer: Medicare Other | Admitting: Oncology

## 2022-01-19 ENCOUNTER — Other Ambulatory Visit: Payer: Medicare Other

## 2022-01-19 ENCOUNTER — Telehealth: Payer: Self-pay

## 2022-01-19 NOTE — Telephone Encounter (Signed)
Patient states she has bladder cancer and will be having an infusion in her bladder once a week for six weeks.  Patient states she would like to get a perm in her hair and she would like to know if the infusion will cause her perm not to take.

## 2022-01-20 NOTE — Telephone Encounter (Signed)
Pt is aware.  

## 2022-01-24 ENCOUNTER — Encounter: Payer: Self-pay | Admitting: Oncology

## 2022-01-24 ENCOUNTER — Inpatient Hospital Stay: Payer: Medicare Other | Attending: Oncology

## 2022-01-24 ENCOUNTER — Inpatient Hospital Stay (HOSPITAL_BASED_OUTPATIENT_CLINIC_OR_DEPARTMENT_OTHER): Payer: Medicare Other | Admitting: Oncology

## 2022-01-24 ENCOUNTER — Inpatient Hospital Stay: Payer: Medicare Other

## 2022-01-24 VITALS — BP 138/90 | HR 89 | Temp 97.0°F | Resp 18 | Wt 116.9 lb

## 2022-01-24 DIAGNOSIS — Z5111 Encounter for antineoplastic chemotherapy: Secondary | ICD-10-CM | POA: Insufficient documentation

## 2022-01-24 DIAGNOSIS — Z23 Encounter for immunization: Secondary | ICD-10-CM | POA: Diagnosis not present

## 2022-01-24 DIAGNOSIS — C678 Malignant neoplasm of overlapping sites of bladder: Secondary | ICD-10-CM

## 2022-01-24 DIAGNOSIS — C679 Malignant neoplasm of bladder, unspecified: Secondary | ICD-10-CM | POA: Diagnosis not present

## 2022-01-24 LAB — CBC WITH DIFFERENTIAL/PLATELET
Abs Immature Granulocytes: 0.01 10*3/uL (ref 0.00–0.07)
Basophils Absolute: 0 10*3/uL (ref 0.0–0.1)
Basophils Relative: 1 %
Eosinophils Absolute: 0.1 10*3/uL (ref 0.0–0.5)
Eosinophils Relative: 1 %
HCT: 36.8 % (ref 36.0–46.0)
Hemoglobin: 12.2 g/dL (ref 12.0–15.0)
Immature Granulocytes: 0 %
Lymphocytes Relative: 31 %
Lymphs Abs: 1.1 10*3/uL (ref 0.7–4.0)
MCH: 31.9 pg (ref 26.0–34.0)
MCHC: 33.2 g/dL (ref 30.0–36.0)
MCV: 96.1 fL (ref 80.0–100.0)
Monocytes Absolute: 0.4 10*3/uL (ref 0.1–1.0)
Monocytes Relative: 12 %
Neutro Abs: 1.9 10*3/uL (ref 1.7–7.7)
Neutrophils Relative %: 55 %
Platelets: 129 10*3/uL — ABNORMAL LOW (ref 150–400)
RBC: 3.83 MIL/uL — ABNORMAL LOW (ref 3.87–5.11)
RDW: 13.3 % (ref 11.5–15.5)
WBC: 3.5 10*3/uL — ABNORMAL LOW (ref 4.0–10.5)
nRBC: 0 % (ref 0.0–0.2)

## 2022-01-24 LAB — COMPREHENSIVE METABOLIC PANEL
ALT: 17 U/L (ref 0–44)
AST: 31 U/L (ref 15–41)
Albumin: 4.4 g/dL (ref 3.5–5.0)
Alkaline Phosphatase: 104 U/L (ref 38–126)
Anion gap: 7 (ref 5–15)
BUN: 11 mg/dL (ref 8–23)
CO2: 27 mmol/L (ref 22–32)
Calcium: 9.6 mg/dL (ref 8.9–10.3)
Chloride: 98 mmol/L (ref 98–111)
Creatinine, Ser: 0.55 mg/dL (ref 0.44–1.00)
GFR, Estimated: >60 mL/min (ref >60)
Glucose, Bld: 111 mg/dL — ABNORMAL HIGH (ref 70–99)
Potassium: 4.7 mmol/L (ref 3.5–5.1)
Sodium: 132 mmol/L — ABNORMAL LOW (ref 135–145)
Total Bilirubin: 0.6 mg/dL (ref 0.3–1.2)
Total Protein: 7.5 g/dL (ref 6.5–8.1)

## 2022-01-24 LAB — URINALYSIS, COMPLETE (UACMP) WITH MICROSCOPIC
Bilirubin Urine: NEGATIVE
Glucose, UA: NEGATIVE mg/dL
Hgb urine dipstick: NEGATIVE
Ketones, ur: NEGATIVE mg/dL
Nitrite: NEGATIVE
Protein, ur: NEGATIVE mg/dL
Specific Gravity, Urine: 1.005 (ref 1.005–1.030)
Squamous Epithelial / HPF: NONE SEEN (ref 0–5)
pH: 7 (ref 5.0–8.0)

## 2022-01-24 MED ORDER — LIDOCAINE HCL URETHRAL/MUCOSAL 2 % EX GEL: 1.0000 | Freq: Once | CUTANEOUS | Status: DC

## 2022-01-24 MED ORDER — GEMCITABINE CHEMO FOR BLADDER INSTILLATION 2000 MG
2000.0000 mg | Freq: Once | INTRAVENOUS | Status: AC
  Administered 2022-01-24: 2000 mg via INTRAVESICAL
  Filled 2022-01-24: qty 52.6

## 2022-01-24 MED ORDER — PROCHLORPERAZINE MALEATE 10 MG PO TABS
10.0000 mg | ORAL_TABLET | Freq: Once | ORAL | Status: AC
  Administered 2022-01-24: 10 mg via ORAL
  Filled 2022-01-24: qty 1

## 2022-01-24 MED ORDER — OXYBUTYNIN CHLORIDE 5 MG PO TABS
5.0000 mg | ORAL_TABLET | Freq: Once | ORAL | Status: AC | PRN
  Administered 2022-01-24: 5 mg via ORAL
  Filled 2022-01-24: qty 1

## 2022-01-24 MED ORDER — INFLUENZA VAC A&B SA ADJ QUAD 0.5 ML IM PRSY
0.5000 mL | PREFILLED_SYRINGE | Freq: Once | INTRAMUSCULAR | Status: AC
  Administered 2022-01-24: 0.5 mL via INTRAMUSCULAR
  Filled 2022-01-24: qty 0.5

## 2022-01-24 NOTE — Progress Notes (Signed)
No side effects from Gemzar. No bladder infections. Has a balance problem. Uses walker.

## 2022-01-24 NOTE — Patient Instructions (Signed)
[facility] [place]  Discharge Instructions: Thank you for choosing [facility] to provide your oncology and hematology care.  If you have a lab appointment with the [facility], please go directly to the [facility] and check in at the registration area.  Wear comfortable clothing and clothing appropriate for easy access to any Portacath or PICC line.   We strive to give you quality time with your provider. You may need to reschedule your appointment if you arrive late (15 or more minutes).  Arriving late affects you and other patients whose appointments are after yours.  Also, if you miss three or more appointments without notifying the office, you may be dismissed from the clinic at the provider's discretion.      For prescription refill requests, have your pharmacy contact our office and allow 72 hours for refills to be completed.    Today you received the following chemotherapy and/or immunotherapy agents GEMZAR      To help prevent nausea and vomiting after your treatment, we encourage you to take your nausea medication as directed.  BELOW ARE SYMPTOMS THAT SHOULD BE REPORTED IMMEDIATELY: *FEVER GREATER THAN 100.4 F (38 C) OR HIGHER *CHILLS OR SWEATING *NAUSEA AND VOMITING THAT IS NOT CONTROLLED WITH YOUR NAUSEA MEDICATION *UNUSUAL SHORTNESS OF BREATH *UNUSUAL BRUISING OR BLEEDING *URINARY PROBLEMS (pain or burning when urinating, or frequent urination) *BOWEL PROBLEMS (unusual diarrhea, constipation, pain near the anus) TENDERNESS IN MOUTH AND THROAT WITH OR WITHOUT PRESENCE OF ULCERS (sore throat, sores in mouth, or a toothache) UNUSUAL RASH, SWELLING OR PAIN  UNUSUAL VAGINAL DISCHARGE OR ITCHING   Items with * indicate a potential emergency and should be followed up as soon as possible or go to the Emergency Department if any problems should occur.  Please show the CHEMOTHERAPY ALERT CARD or IMMUNOTHERAPY ALERT CARD at check-in to the  Emergency Department and triage nurse.  Should you have questions after your visit or need to cancel or reschedule your appointment, please contact [facility] [place] [facility] [place]  [phone] and follow the prompts.  Office hours are 8:00 a.m. to 4:30 p.m. Monday - Friday. Please note that voicemails left after 4:00 p.m. may not be returned until the following business day.  We are closed weekends and major holidays. You have access to a nurse at all times for urgent questions. Please call the main number to the clinic [phone] and follow the prompts.  For any non-urgent questions, you may also contact your provider using MyChart. We now offer e-Visits for anyone 56 and older to request care online for non-urgent symptoms. For details visit [email or web address].   Also download the MyChart app! Go to the app store, search "MyChart", open the app, select [facility], and log in with your MyChart username and password.  Masks are optional in the cancer centers. If you would like for your care team to wear a mask while they are taking care of you, please let them know. For doctor visits, patients may have with them one support person who is at least 82 years old. At this time, visitors are not allowed in the infusion area.  Gemcitabine Injection What is this medication? GEMCITABINE (jem SYE ta been) treats some types of cancer. It works by slowing down the growth of cancer cells. This medicine may be used for other purposes; ask your health care provider or pharmacist if you have questions. COMMON BRAND NAME(S): Gemzar, Infugem What should I tell my care  team before I take this medication? They need to know if you have any of these conditions: Blood disorders Infection Kidney disease Liver disease Lung or breathing disease, such as asthma or COPD Recent or ongoing radiation therapy An unusual or allergic reaction to gemcitabine, other medications, foods, dyes, or  preservatives If you or your partner are pregnant or trying to get pregnant Breast-feeding How should I use this medication? This medication is injected into a vein. It is given by your care team in a hospital or clinic setting. Talk to your care team about the use of this medication in children. Special care may be needed. Overdosage: If you think you have taken too much of this medicine contact a poison control center or emergency room at once. NOTE: This medicine is only for you. Do not share this medicine with others. What if I miss a dose? Keep appointments for follow-up doses. It is important not to miss your dose. Call your care team if you are unable to keep an appointment. What may interact with this medication? Interactions have not been studied. This list may not describe all possible interactions. Give your health care provider a list of all the medicines, herbs, non-prescription drugs, or dietary supplements you use. Also tell them if you smoke, drink alcohol, or use illegal drugs. Some items may interact with your medicine. What should I watch for while using this medication? Your condition will be monitored carefully while you are receiving this medication. This medication may make you feel generally unwell. This is not uncommon, as chemotherapy can affect healthy cells as well as cancer cells. Report any side effects. Continue your course of treatment even though you feel ill unless your care team tells you to stop. In some cases, you may be given additional medications to help with side effects. Follow all directions for their use. This medication may increase your risk of getting an infection. Call your care team for advice if you get a fever, chills, sore throat, or other symptoms of a cold or flu. Do not treat yourself. Try to avoid being around people who are sick. This medication may increase your risk to bruise or bleed. Call your care team if you notice any unusual  bleeding. Be careful brushing or flossing your teeth or using a toothpick because you may get an infection or bleed more easily. If you have any dental work done, tell your dentist you are receiving this medication. Avoid taking medications that contain aspirin, acetaminophen, ibuprofen, naproxen, or ketoprofen unless instructed by your care team. These medications may hide a fever. Talk to your care team if you or your partner wish to become pregnant or think you might be pregnant. This medication can cause serious birth defects if taken during pregnancy and for 6 months after the last dose. A negative pregnancy test is required before starting this medication. A reliable form of contraception is recommended while taking this medication and for 6 months after the last dose. Talk to your care team about effective forms of contraception. Do not father a child while taking this medication and for 3 months after the last dose. Use a condom while having sex during this time period. Do not breastfeed while taking this medication and for at least 1 week after the last dose. This medication may cause infertility. Talk to your care team if you are concerned about your fertility. What side effects may I notice from receiving this medication? Side effects that you should report to  your care team as soon as possible: Allergic reactions--skin rash, itching, hives, swelling of the face, lips, tongue, or throat Capillary leak syndrome--stomach or muscle pain, unusual weakness or fatigue, feeling faint or lightheaded, decrease in the amount of urine, swelling of the ankles, hands, or feet, trouble breathing Infection--fever, chills, cough, sore throat, wounds that don't heal, pain or trouble when passing urine, general feeling of discomfort or being unwell Liver injury--right upper belly pain, loss of appetite, nausea, light-colored stool, dark yellow or brown urine, yellowing skin or eyes, unusual weakness or  fatigue Low red blood cell level--unusual weakness or fatigue, dizziness, headache, trouble breathing Lung injury--shortness of breath or trouble breathing, cough, spitting up blood, chest pain, fever Stomach pain, bloody diarrhea, pale skin, unusual weakness or fatigue, decrease in the amount of urine, which may be signs of hemolytic uremic syndrome Sudden and severe headache, confusion, change in vision, seizures, which may be signs of posterior reversible encephalopathy syndrome (PRES) Unusual bruising or bleeding Side effects that usually do not require medical attention (report to your care team if they continue or are bothersome): Diarrhea Drowsiness Hair loss Nausea Pain, redness, or swelling with sores inside the mouth or throat Vomiting This list may not describe all possible side effects. Call your doctor for medical advice about side effects. You may report side effects to FDA at 1-800-FDA-1088. Where should I keep my medication? This medication is given in a hospital or clinic. It will not be stored at home. NOTE: This sheet is a summary. It may not cover all possible information. If you have questions about this medicine, talk to your doctor, pharmacist, or health care provider.  2023 Elsevier/Gold Standard (2007-04-28 00:00:00)

## 2022-01-24 NOTE — Progress Notes (Signed)
[facility]  [phone] [phone]) [phone]  ID: [name] OB: [birth year]  MR#: [id number]  OEU#:[id number]  Patient Care Team: [name], MD as PCP - General (Internal Medicine) Deboraha Sprang, MD as PCP - Electrophysiology (Cardiology) Briscoe Deutscher, MD (Family Medicine)  CHIEF COMPLAINT: Minimally invasive bladder cancer.  INTERVAL HISTORY: Patient returns to clinic today for further evaluation and continuation of intravesicular gemcitabine.  She is tolerating her treatments well only with occasional bladder spasms.  She currently feels well and is asymptomatic.  She has no neurologic complaints.  She denies any recent fevers or illnesses.  She has a good appetite and denies weight loss.  She has no chest pain, shortness of breath, cough, or hemoptysis.  She denies any nausea, vomiting, constipation, or diarrhea.  She has no urinary complaints.  Patient offers no further specific complaints today.  REVIEW OF SYSTEMS:   Review of Systems  Constitutional: Negative.  Negative for fever, malaise/fatigue and weight loss.  Respiratory: Negative.  Negative for cough, hemoptysis and shortness of breath.   Cardiovascular: Negative.  Negative for chest pain and leg swelling.  Gastrointestinal: Negative.  Negative for abdominal pain.  Genitourinary: Negative.  Negative for dysuria, frequency and hematuria.  Musculoskeletal: Negative.  Negative for back pain.  Skin: Negative.  Negative for rash.  Neurological: Negative.  Negative for dizziness, focal weakness, weakness and headaches.  Psychiatric/Behavioral: Negative.  The patient is not nervous/anxious.     As per HPI. Otherwise, a complete review of systems is negative.  PAST MEDICAL HISTORY: Past Medical History:  Diagnosis Date   AICD (automatic cardioverter/defibrillator) present EP cardiologist--- dr Caryl Comes    placement 05-07-2014 , ef 25%,  NICM/  (02-11-2019 last echo 08-31-2018 ef 60-65%)    Arthritis    "in about all my joints; for sure in my back"   Bladder cancer [facility]) urologist-  dr Junious Silk   dx 1995--  recurrent bladder cancer 2015 , s/p TURBT's and chemo instillation's ;   04/ 2019  s/p TURBT   Chronic hyponatremia    Chronic low back pain with bilateral sciatica    s/p  spinal cord stimulator @ [facility]  [id number]   Chronic systolic (congestive) heart failure [facility])    cardiologist-  dr Rogue Jury   COPD with emphysema ([place])    (02-11-2019  per pt has never been on oxygen)   Coronary artery disease cardiologist-  dr Kathlyn [place]   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% per cardiac cath 03-03-2014   DDD (degenerative disc disease), thoracolumbar    Frequent urination    Full dentures    Gait instability    due to chronic low back pain, uses roller walker   GERD (gastroesophageal reflux disease)    History of iron deficiency anemia 11/2013   resolved w/ IV Iron  (02-11-2019  per pt has not had any issues since 2015)   History of stomach ulcers 11/2013   Hypertension    LBBB (left bundle branch block)    NICM (nonischemic cardiomyopathy) ([place]) last echo 12-31-2015 ef 55-60%   dx 09/ 2015 per echo 20%;  myoview 09/ 2015 ef 28%;  per cardiac cath 12/ 2015 ef 30%;     Nocturia more than twice per night    S/P insertion of spinal cord stimulator followed by [facility]---- Dr Pecola Leisure (notes in care everywhere)   10-09-2018  '@[facility]'$ --- thoracic spinal cord stimular/ generator  (device from [facility])---- per pt has a control  Scoliosis    Uses walker 02/18/2021   all the time   Wears glasses    for reading    PAST SURGICAL HISTORY: Past Surgical History:  Procedure Laterality Date   BI-VENTRICULAR IMPLANTABLE CARDIOVERTER DEFIBRILLATOR N/A 05/07/2014   Procedure: BI-VENTRICULAR IMPLANTABLE CARDIOVERTER DEFIBRILLATOR  (CRT-D);  Surgeon: Deboraha Sprang, MD;  Location: [facility] CATH LAB;  Service: Cardiovascular;  Laterality: N/A;   BIV PACEMAKER GENERATOR CHANGE OUT     CARDIAC  CATHETERIZATION  03-03-2014  dr Kathlyn [place]   [facility]   pLAD 20%, pRCA 20%, dRCA 50%, RPLS 50%;  ef 30%, mild elevated LVEDP, mild gradient across aortic valve LVOT   CARDIOVASCULAR STRESS TEST  11/25/2013   High risk nuclear study w/ large high severity inferior wall perfusion defect on stress and rest images, large mild severity anteroseptal wall perfusion defect on stress and rest images, No inducible ischemia/ global moderate hypokinesis, ef 28%   CATARACT EXTRACTION W/ INTRAOCULAR LENS  IMPLANT, BILATERAL Bilateral right 12-2013 / left  02-2014   colonscopy  11/2020   benign polyps removed   CYSTOSCOPY W/ RETROGRADES Bilateral 01/06/2015   Procedure: CYSTOSCOPY WITH  BLADDER BIOPSY BILATERAL RETROGRADE PYELOGRAM,INSTILLATION OF MITOMYCIN C;  Surgeon: [name], MD;  Location: [facility];  Service: Urology;  Laterality: Bilateral;   CYSTOSCOPY WITH BIOPSY N/A 10/06/2015   Procedure: CYSTO WITH BLADDER BIOPSY, FULGERATION, CHEMO IRRIGATION EPIRUBICIN IN PACU;  Surgeon: [name], MD;  Location: [facility];  Service: Urology;  Laterality: N/A;   CYSTOSCOPY WITH BIOPSY N/A 02/12/2019   Procedure: CYSTOSCOPY WITH BIOPSY/ FULGURATION/ INSTILLATION OF GEMCITABINE, bilateral retrograde turbt greater 5cm;  Surgeon: [name], MD;  Location: [facility];  Service: Urology;  Laterality: N/A;   CYSTOSCOPY WITH BIOPSY N/A 03/19/2021   Procedure: CYSTOSCOPY WITH TRANSURETHRAL RESECTION OF BLADDER TUMOR  GREATER THAN 5CM WITH POST-OPERATIVE INSTILLATION OF EPIRUBICIN;  Surgeon: [name], MD;  Location: [facility];  Service: Urology;  Laterality: N/A;   CYSTOSCOPY WITH FULGERATION N/A 06/30/2017   Procedure: Marland Kitchen CYSTOSCOPY WITH [place] FULGERATION/ BLADDER BIOPSY/ INSTILLATION OF EPIRUBICIN;  Surgeon: [name], MD;  Location: [facility];  Service: Urology;  Laterality: N/A;   CYSTOSCOPY WITH RETROGRADE PYELOGRAM, URETEROSCOPY AND STENT PLACEMENT  Bilateral 04/18/2014   Procedure: CYSTOSCOPY WITH RETROGRADE PYELOGRAM;  Surgeon: [name], MD;  Location: [facility];  Service: Urology;  Laterality: Bilateral;   ESOPHAGOGASTRODUODENOSCOPY N/A 11/28/2013   Procedure: ESOPHAGOGASTRODUODENOSCOPY (EGD);  Surgeon: Arta Silence, MD;  Location: [facility] ENDOSCOPY;  Service: Endoscopy;  Laterality: N/A;   ICD GENERATOR CHANGEOUT N/A 11/04/2021   Procedure: ICD GENERATOR CHANGEOUT;  Surgeon: Deboraha Sprang, MD;  Location: [place] CV LAB;  Service: Cardiovascular;  Laterality: N/A;   LUMBAR DISC SURGERY  1980's   "ruptured disc"   SPINAL CORD STIMULATOR IMPLANT  10-09-2018   '@[facility]'$    Thoracic spinal cord stimular/ genertor  (left flank)----- (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[employer])   TRANSTHORACIC ECHOCARDIOGRAM  12/31/2015   dr Caryl Comes   ef 59-563, grade 1 diastolic dysfunction/ mild MR/ septal motion showed abnormal function and dyssynergy   TRANSURETHRAL RESECTION OF BLADDER  1995   TRANSURETHRAL RESECTION OF BLADDER TUMOR N/A 12/13/2013   Procedure: TRANSURETHRAL RESECTION OF BLADDER TUMOR (TURBT);  Surgeon: [name], MD;  Location: [facility];  Service: Urology;  Laterality: N/A;   TRANSURETHRAL RESECTION OF BLADDER TUMOR N/A 01/17/2014   Procedure: TRANSURETHRAL RESECTION OF BLADDER TUMOR (TURBT);  Surgeon: [name], MD;  [facility];  Service: Urology;  Laterality: N/A;   TRANSURETHRAL RESECTION OF BLADDER TUMOR N/A 04/18/2014   Procedure: TRANSURETHRAL RESECTION OF BLADDER TUMOR (TURBT), CYSTOGRAM;  Surgeon: [name], MD;  Location: [facility];  Service: Urology;  Laterality: N/A;   TRANSURETHRAL RESECTION OF BLADDER TUMOR N/A 12/13/2019   Procedure: TRANSURETHRAL RESECTION OF BLADDER TUMOR (TURBT) GREATER THAN 5CM WITH CYSTOSCOPY/ POST OPERATIVE INSTILLATION OF GEMCITABINE;  Surgeon: [name], MD;  Location: [facility];  Service: Urology;  Laterality: N/A;   TRANSURETHRAL RESECTION OF BLADDER TUMOR N/A  10/06/2020   Procedure: TRANSURETHRAL RESECTION OF BLADDER TUMOR (TURBT)/ CYSTOSCOPY/  BLADDER BIOPSY;  Surgeon: [name], MD;  Location: [facility];  Service: Urology;  Laterality: N/A;   TRANSURETHRAL RESECTION OF BLADDER TUMOR N/A 11/30/2021   Procedure: TRANSURETHRAL RESECTION OF BLADDER TUMOR (TURBT) WITH POST OPERATIVE INSTILLATION OF GEMCITABINE;  Surgeon: [name], MD;  Location: [facility];  Service: Urology;  Laterality: N/A;    FAMILY HISTORY: Family History  Problem Relation Age of Onset   Cancer Mother    Cancer Brother     ADVANCED DIRECTIVES (Y/N):  N  HEALTH MAINTENANCE: Social History   Tobacco Use   Smoking status: Every Day    Packs/day: 0.50    Years: 56.00    Total pack years: 28.00    Types: Cigarettes   Smokeless tobacco: Never   Tobacco comments:    02-11-2019  per pt currently smokes 10 cig's per day , started smoking age 31  Vaping Use   Vaping Use: Never used  Substance Use Topics   Alcohol use: No   Drug use: No     Colonoscopy:  PAP:  Bone density:  Lipid panel:  No Known Allergies  Current Outpatient Medications  Medication Sig Dispense Refill   acetaminophen (TYLENOL) 500 MG tablet Take 1,000 mg by mouth every 8 (eight) hours as needed for moderate pain.     amitriptyline (ELAVIL) 50 MG tablet Take 1 tablet (50 mg total) by mouth at bedtime. 90 tablet 1   amLODipine (NORVASC) 5 MG tablet Take 1 tablet (5 mg total) by mouth daily. 90 tablet 2   gabapentin (NEURONTIN) 300 MG capsule Take 2 capsules (600 mg total) by mouth 3 (three) times daily. (Patient taking differently: Take 900-1,200 mg by mouth at bedtime.) 270 capsule 1   Lactobacillus-Inulin (CULTURELLE DIGESTIVE DAILY PRO) CAPS Take 1 capsule by mouth daily. 30 capsule 0   losartan (COZAAR) 100 MG tablet Take 1 tablet (100 mg total) by mouth at bedtime. 90 tablet 1   Menthol, Topical Analgesic, (BIOFREEZE EX) Apply 1 application topically 4 (four) times daily as needed  (back pain).     metoprolol succinate (TOPROL-XL) 25 MG 24 hr tablet TAKE 1 TABLET(25 MG) BY MOUTH DAILY (Patient taking differently: Take 25 mg by mouth daily.) 90 tablet 3   ondansetron (ZOFRAN) 8 MG tablet Take 1 tablet (8 mg total) by mouth every 8 (eight) hours as needed for nausea or vomiting. 30 tablet 1   oxyCODONE-acetaminophen (PERCOCET) 10-325 MG tablet Take 1 tablet by mouth every 4 (four) hours as needed for pain. 150 tablet 0   pantoprazole (PROTONIX) 40 MG tablet Take 40 mg by mouth at bedtime.     polyethylene glycol (MIRALAX / GLYCOLAX) packet Take 17 g by mouth daily as needed for mild constipation.      promethazine (PHENERGAN) 12.5 MG tablet TAKE 1 TABLET(12.5 MG) BY MOUTH EVERY 8 HOURS AS NEEDED FOR NAUSEA OR VOMITING 30 tablet 2  sucralfate (CARAFATE) 1 g tablet TAKE 1 TABLET BY MOUTH ONE HOUR BEFORE EACH MEAL AND TWO HOURS AFTER OTHER MEDICATIONS (Patient taking differently: Take 1 g by mouth See admin instructions. Take 1 tablet one hour before each meal and 2 hours after all other medications) 90 tablet 1   [START ON 01/26/2022] oxyCODONE-acetaminophen (PERCOCET) 10-325 MG tablet Take 1 tablet by mouth every 4 (four) hours as needed for pain. 150 tablet 0   prochlorperazine (COMPAZINE) 10 MG tablet Take 1 tablet (10 mg total) by mouth every 6 (six) hours as needed for nausea or vomiting. (Patient not taking: Reported on 01/24/2022) 30 tablet 1   No current facility-administered medications for this visit.   Facility-Administered Medications Ordered in Other Visits  Medication Dose Route Frequency Provider Last Rate Last Admin   epirubicin (ELLENCE) 50 mg in sodium chloride 0.9 % bladder instillation  50 mg Bladder Instillation Once [name], MD       epirubicin (ELLENCE) 80 mg in sodium chloride (PF) 0.9 % bladder instillation  80 mg Bladder Instillation Once Nyoka Cowden, Terri L, RPH       gemcitabine (GEMZAR) chemo syringe for bladder instillation 2,000 mg  2,000 mg  Bladder Instillation Once [name], MD       gemcitabine [facility]) chemo syringe for bladder instillation 2,000 mg  2,000 mg Bladder Instillation Once [name], MD        OBJECTIVE: Vitals:   01/24/22 0951  BP: (!) 138/90  Pulse: 89  Resp: 18  Temp: (!) 97 F (36.1 C)  SpO2: 100%     Body mass index is 17.77 kg/m.    ECOG FS:0 - Asymptomatic  General: Well-developed, well-nourished, no acute distress. Eyes: Pink conjunctiva, anicteric sclera. HEENT: Normocephalic, moist mucous membranes. Lungs: No audible wheezing or coughing. Heart: Regular rate and rhythm. Abdomen: Soft, nontender, no obvious distention. Musculoskeletal: No edema, cyanosis, or clubbing. Neuro: Alert, answering all questions appropriately. Cranial nerves grossly intact. Skin: No rashes or petechiae noted. Psych: Normal affect.  LAB RESULTS:  Lab Results  Component Value Date   NA 132 (L) 01/24/2022   K 4.7 01/24/2022   CL 98 01/24/2022   CO2 27 01/24/2022   GLUCOSE 111 (H) 01/24/2022   BUN 11 01/24/2022   CREATININE 0.55 01/24/2022   CALCIUM 9.6 01/24/2022   PROT 7.5 01/24/2022   ALBUMIN 4.4 01/24/2022   AST 31 01/24/2022   ALT 17 01/24/2022   ALKPHOS 104 01/24/2022   BILITOT 0.6 01/24/2022   GFRNONAA >60 01/24/2022   GFRAA >60 06/17/2019    Lab Results  Component Value Date   WBC 3.5 (L) 01/24/2022   NEUTROABS 1.9 01/24/2022   HGB 12.2 01/24/2022   HCT 36.8 01/24/2022   MCV 96.1 01/24/2022   PLT 129 (L) 01/24/2022     STUDIES: No results found.  ASSESSMENT: Minimally invasive bladder cancer.  PLAN:    1. Minimally invasive bladder cancer: Patient received intravesical gemcitabine approximately 2 years ago.  She now has minimally invasive disease into the lamina propria, but there was no muscularis propria on sample.  Discussion with urology confirmed that no repeat biopsy is planned until her next cystoscopy is scheduled in early December.  Despite recommendation  for Keytruda every 3 weeks for up to 2 years, patient has declined and wishes to pursue intravesical gemcitabine weekly x6.  She did agree that she has persistent disease at her cystoscopy in December she would consider IV Keytruda at that time.  Proceed with treatment  as scheduled.  Return to clinic weekly for gemcitabine and then in approximately 3 weeks for further evaluation.  I spent a total of 30 minutes reviewing chart data, face-to-face evaluation with the patient, counseling and coordination of care as detailed above.    Patient expressed understanding and was in agreement with this plan. She also understands that She can call clinic at any time with any questions, concerns, or complaints.    Cancer Staging  Bladder cancer [facility]) Staging form: Urinary Bladder, AJCC 7th Edition - Clinical stage from 03/19/2019: Stage I (T1, N0, M0) - Signed by Lloyd Huger, MD on 12/23/2021   Lloyd Huger, MD   01/24/2022 3:26 PM

## 2022-01-26 ENCOUNTER — Other Ambulatory Visit: Payer: Medicare Other

## 2022-01-26 ENCOUNTER — Ambulatory Visit: Payer: Medicare Other

## 2022-01-31 ENCOUNTER — Other Ambulatory Visit: Payer: Self-pay | Admitting: Oncology

## 2022-01-31 ENCOUNTER — Inpatient Hospital Stay: Payer: Medicare Other

## 2022-01-31 VITALS — BP 136/89 | Temp 98.0°F | Resp 18 | Wt 116.0 lb

## 2022-01-31 DIAGNOSIS — Z23 Encounter for immunization: Secondary | ICD-10-CM | POA: Diagnosis not present

## 2022-01-31 DIAGNOSIS — C679 Malignant neoplasm of bladder, unspecified: Secondary | ICD-10-CM | POA: Diagnosis not present

## 2022-01-31 DIAGNOSIS — C678 Malignant neoplasm of overlapping sites of bladder: Secondary | ICD-10-CM

## 2022-01-31 DIAGNOSIS — N3001 Acute cystitis with hematuria: Secondary | ICD-10-CM

## 2022-01-31 DIAGNOSIS — Z5111 Encounter for antineoplastic chemotherapy: Secondary | ICD-10-CM | POA: Diagnosis not present

## 2022-01-31 LAB — URINALYSIS, COMPLETE (UACMP) WITH MICROSCOPIC
Bilirubin Urine: NEGATIVE
Glucose, UA: NEGATIVE mg/dL
Ketones, ur: NEGATIVE mg/dL
Nitrite: POSITIVE — AB
Protein, ur: 100 mg/dL — AB
Specific Gravity, Urine: 1.011 (ref 1.005–1.030)
Squamous Epithelial / HPF: NONE SEEN (ref 0–5)
WBC, UA: >50 WBC/hpf — ABNORMAL HIGH (ref 0–5)
pH: 6 (ref 5.0–8.0)

## 2022-01-31 LAB — COMPREHENSIVE METABOLIC PANEL
ALT: 19 U/L (ref 0–44)
AST: 33 U/L (ref 15–41)
Albumin: 4.2 g/dL (ref 3.5–5.0)
Alkaline Phosphatase: 120 U/L (ref 38–126)
Anion gap: 10 (ref 5–15)
BUN: 12 mg/dL (ref 8–23)
CO2: 26 mmol/L (ref 22–32)
Calcium: 9.7 mg/dL (ref 8.9–10.3)
Chloride: 97 mmol/L — ABNORMAL LOW (ref 98–111)
Creatinine, Ser: 0.6 mg/dL (ref 0.44–1.00)
GFR, Estimated: >60 mL/min (ref >60)
Glucose, Bld: 97 mg/dL (ref 70–99)
Potassium: 4.8 mmol/L (ref 3.5–5.1)
Sodium: 133 mmol/L — ABNORMAL LOW (ref 135–145)
Total Bilirubin: 0.2 mg/dL — ABNORMAL LOW (ref 0.3–1.2)
Total Protein: 7.6 g/dL (ref 6.5–8.1)

## 2022-01-31 LAB — CBC WITH DIFFERENTIAL/PLATELET
Abs Immature Granulocytes: 0.02 10*3/uL (ref 0.00–0.07)
Basophils Absolute: 0 10*3/uL (ref 0.0–0.1)
Basophils Relative: 1 %
Eosinophils Absolute: 0 10*3/uL (ref 0.0–0.5)
Eosinophils Relative: 1 %
HCT: 36.6 % (ref 36.0–46.0)
Hemoglobin: 11.9 g/dL — ABNORMAL LOW (ref 12.0–15.0)
Immature Granulocytes: 0 %
Lymphocytes Relative: 25 %
Lymphs Abs: 1.5 10*3/uL (ref 0.7–4.0)
MCH: 31.6 pg (ref 26.0–34.0)
MCHC: 32.5 g/dL (ref 30.0–36.0)
MCV: 97.3 fL (ref 80.0–100.0)
Monocytes Absolute: 0.7 10*3/uL (ref 0.1–1.0)
Monocytes Relative: 13 %
Neutro Abs: 3.5 10*3/uL (ref 1.7–7.7)
Neutrophils Relative %: 60 %
Platelets: 139 10*3/uL — ABNORMAL LOW (ref 150–400)
RBC: 3.76 MIL/uL — ABNORMAL LOW (ref 3.87–5.11)
RDW: 14.1 % (ref 11.5–15.5)
WBC: 5.8 10*3/uL (ref 4.0–10.5)
nRBC: 0 % (ref 0.0–0.2)

## 2022-01-31 MED ORDER — SULFAMETHOXAZOLE-TRIMETHOPRIM 800-160 MG PO TABS: 1.0000 | ORAL_TABLET | Freq: Two times a day (BID) | ORAL | 0 refills | Status: DC

## 2022-01-31 NOTE — Progress Notes (Signed)
No treatment today. Patient's UA revealed UTI. Per Dr. Grayland Ormond, patient to take Bactrim PO BID x 7 days and keep appointment scheduled for next week. RX sent to [facility] per patient's request. Patient provided with instructions on UTI and antibiotics. Patient verbalized understanding.

## 2022-02-02 ENCOUNTER — Ambulatory Visit: Payer: Medicare Other

## 2022-02-02 ENCOUNTER — Other Ambulatory Visit: Payer: Medicare Other

## 2022-02-07 ENCOUNTER — Inpatient Hospital Stay: Payer: Medicare Other

## 2022-02-07 ENCOUNTER — Ambulatory Visit (INDEPENDENT_AMBULATORY_CARE_PROVIDER_SITE_OTHER): Payer: Medicare Other

## 2022-02-07 VITALS — BP 136/91 | HR 78 | Temp 98.2°F | Resp 18 | Wt 117.5 lb

## 2022-02-07 DIAGNOSIS — Z23 Encounter for immunization: Secondary | ICD-10-CM | POA: Diagnosis not present

## 2022-02-07 DIAGNOSIS — Z5111 Encounter for antineoplastic chemotherapy: Secondary | ICD-10-CM | POA: Diagnosis not present

## 2022-02-07 DIAGNOSIS — C678 Malignant neoplasm of overlapping sites of bladder: Secondary | ICD-10-CM

## 2022-02-07 DIAGNOSIS — I428 Other cardiomyopathies: Secondary | ICD-10-CM

## 2022-02-07 DIAGNOSIS — C679 Malignant neoplasm of bladder, unspecified: Secondary | ICD-10-CM | POA: Diagnosis not present

## 2022-02-07 LAB — CBC WITH DIFFERENTIAL/PLATELET
Abs Immature Granulocytes: 0.02 10*3/uL (ref 0.00–0.07)
Basophils Absolute: 0 10*3/uL (ref 0.0–0.1)
Basophils Relative: 1 %
Eosinophils Absolute: 0.1 10*3/uL (ref 0.0–0.5)
Eosinophils Relative: 1 %
HCT: 35.9 % — ABNORMAL LOW (ref 36.0–46.0)
Hemoglobin: 11.8 g/dL — ABNORMAL LOW (ref 12.0–15.0)
Immature Granulocytes: 0 %
Lymphocytes Relative: 22 %
Lymphs Abs: 1.4 10*3/uL (ref 0.7–4.0)
MCH: 31.6 pg (ref 26.0–34.0)
MCHC: 32.9 g/dL (ref 30.0–36.0)
MCV: 96 fL (ref 80.0–100.0)
Monocytes Absolute: 0.8 10*3/uL (ref 0.1–1.0)
Monocytes Relative: 13 %
Neutro Abs: 4 10*3/uL (ref 1.7–7.7)
Neutrophils Relative %: 63 %
Platelets: 243 10*3/uL (ref 150–400)
RBC: 3.74 MIL/uL — ABNORMAL LOW (ref 3.87–5.11)
RDW: 14.8 % (ref 11.5–15.5)
WBC: 6.3 10*3/uL (ref 4.0–10.5)
nRBC: 0 % (ref 0.0–0.2)

## 2022-02-07 LAB — URINALYSIS, COMPLETE (UACMP) WITH MICROSCOPIC
Bilirubin Urine: NEGATIVE
Glucose, UA: NEGATIVE mg/dL
Hgb urine dipstick: NEGATIVE
Ketones, ur: NEGATIVE mg/dL
Nitrite: NEGATIVE
Protein, ur: NEGATIVE mg/dL
Specific Gravity, Urine: 1.009 (ref 1.005–1.030)
WBC, UA: >50 WBC/hpf — ABNORMAL HIGH (ref 0–5)
pH: 6 (ref 5.0–8.0)

## 2022-02-07 LAB — COMPREHENSIVE METABOLIC PANEL
ALT: 14 U/L (ref 0–44)
AST: 30 U/L (ref 15–41)
Albumin: 4.2 g/dL (ref 3.5–5.0)
Alkaline Phosphatase: 104 U/L (ref 38–126)
Anion gap: 7 (ref 5–15)
BUN: 17 mg/dL (ref 8–23)
CO2: 25 mmol/L (ref 22–32)
Calcium: 9.4 mg/dL (ref 8.9–10.3)
Chloride: 91 mmol/L — ABNORMAL LOW (ref 98–111)
Creatinine, Ser: 0.83 mg/dL (ref 0.44–1.00)
GFR, Estimated: >60 mL/min (ref >60)
Glucose, Bld: 104 mg/dL — ABNORMAL HIGH (ref 70–99)
Potassium: 5.4 mmol/L — ABNORMAL HIGH (ref 3.5–5.1)
Sodium: 123 mmol/L — ABNORMAL LOW (ref 135–145)
Total Bilirubin: 0.3 mg/dL (ref 0.3–1.2)
Total Protein: 7.3 g/dL (ref 6.5–8.1)

## 2022-02-07 MED ORDER — OXYBUTYNIN CHLORIDE 5 MG PO TABS
5.0000 mg | ORAL_TABLET | Freq: Once | ORAL | Status: AC | PRN
  Administered 2022-02-07: 5 mg via ORAL
  Filled 2022-02-07: qty 1

## 2022-02-07 MED ORDER — PROCHLORPERAZINE MALEATE 10 MG PO TABS
10.0000 mg | ORAL_TABLET | Freq: Once | ORAL | Status: AC
  Administered 2022-02-07: 10 mg via ORAL
  Filled 2022-02-07: qty 1

## 2022-02-07 MED ORDER — GEMCITABINE CHEMO FOR BLADDER INSTILLATION 2000 MG
2000.0000 mg | Freq: Once | INTRAVENOUS | Status: AC
  Administered 2022-02-07: 2000 mg via INTRAVESICAL
  Filled 2022-02-07: qty 52.6

## 2022-02-07 NOTE — Progress Notes (Signed)
Patient held the Gemzar in her bladder for an hour with no complaints. Gemzar was drained from the bladder and catheter was removed. Patient discharged in ambulatory, stable condition.

## 2022-02-07 NOTE — Patient Instructions (Signed)
[facility] [place]  Discharge Instructions: Thank you for choosing [facility] to provide your oncology and hematology care.  If you have a lab appointment with the Cancer Center, please go directly to the Cancer Center and check in at the registration area.  Wear comfortable clothing and clothing appropriate for easy access to any Portacath or PICC line.   We strive to give you quality time with your provider. You may need to reschedule your appointment if you arrive late (15 or more minutes).  Arriving late affects you and other patients whose appointments are after yours.  Also, if you miss three or more appointments without notifying the office, you may be dismissed from the clinic at the provider's discretion.      For prescription refill requests, have your pharmacy contact our office and allow 72 hours for refills to be completed.       To help prevent nausea and vomiting after your treatment, we encourage you to take your nausea medication as directed.  BELOW ARE SYMPTOMS THAT SHOULD BE REPORTED IMMEDIATELY: *FEVER GREATER THAN 100.4 F (38 C) OR HIGHER *CHILLS OR SWEATING *NAUSEA AND VOMITING THAT IS NOT CONTROLLED WITH YOUR NAUSEA MEDICATION *UNUSUAL SHORTNESS OF BREATH *UNUSUAL BRUISING OR BLEEDING *URINARY PROBLEMS (pain or burning when urinating, or frequent urination) *BOWEL PROBLEMS (unusual diarrhea, constipation, pain near the anus) TENDERNESS IN MOUTH AND THROAT WITH OR WITHOUT PRESENCE OF ULCERS (sore throat, sores in mouth, or a toothache) UNUSUAL RASH, SWELLING OR PAIN  UNUSUAL VAGINAL DISCHARGE OR ITCHING   Items with * indicate a potential emergency and should be followed up as soon as possible or go to the Emergency Department if any problems should occur.  Please show the CHEMOTHERAPY ALERT CARD or IMMUNOTHERAPY ALERT CARD at check-in to the Emergency Department and triage nurse.  Should you have questions after your  visit or need to cancel or reschedule your appointment, please contact [facility] [place]  [phone] and follow the prompts.  Office hours are 8:00 a.m. to 4:30 p.m. Monday - Friday. Please note that voicemails left after 4:00 p.m. may not be returned until the following business day.  We are closed weekends and major holidays. You have access to a nurse at all times for urgent questions. Please call the main number to the clinic [phone] and follow the prompts.  For any non-urgent questions, you may also contact your provider using MyChart. We now offer e-Visits for anyone 18 and older to request care online for non-urgent symptoms. For details visit [email or web address].   Also download the MyChart app! Go to the app store, search "MyChart", open the app, select [place], and log in with your MyChart username and password.  Masks are optional in the cancer centers. If you would like for your care team to wear a mask while they are taking care of you, please let them know. For doctor visits, patients may have with them one support person who is at least 82 years old. At this time, visitors are not allowed in the infusion area.   

## 2022-02-08 ENCOUNTER — Ambulatory Visit: Payer: Medicare Other | Attending: Internal Medicine

## 2022-02-08 DIAGNOSIS — Z95810 Presence of automatic (implantable) cardiac defibrillator: Secondary | ICD-10-CM

## 2022-02-08 DIAGNOSIS — I2510 Atherosclerotic heart disease of native coronary artery without angina pectoris: Secondary | ICD-10-CM | POA: Diagnosis not present

## 2022-02-08 DIAGNOSIS — I428 Other cardiomyopathies: Secondary | ICD-10-CM

## 2022-02-08 DIAGNOSIS — I493 Ventricular premature depolarization: Secondary | ICD-10-CM | POA: Diagnosis not present

## 2022-02-08 DIAGNOSIS — I5022 Chronic systolic (congestive) heart failure: Secondary | ICD-10-CM

## 2022-02-08 DIAGNOSIS — I447 Left bundle-branch block, unspecified: Secondary | ICD-10-CM

## 2022-02-08 LAB — CUP PACEART REMOTE DEVICE CHECK
Battery Remaining Longevity: 86 mo
Battery Voltage: 3.01 V
Brady Statistic AP VP Percent: 44.05 %
Brady Statistic AP VS Percent: 0.68 %
Brady Statistic AS VP Percent: 53.79 %
Brady Statistic AS VS Percent: 1.48 %
Brady Statistic RA Percent Paced: 43.77 %
Brady Statistic RV Percent Paced: 95.9 %
Date Time Interrogation Session: 20231120033524
HighPow Impedance: 71 Ohm
Implantable Lead Connection Status: 753985
Implantable Lead Connection Status: 753985
Implantable Lead Connection Status: 753985
Implantable Lead Implant Date: 20160217
Implantable Lead Implant Date: 20160217
Implantable Lead Implant Date: 20160217
Implantable Lead Location: 753858
Implantable Lead Location: 753859
Implantable Lead Location: 753860
Implantable Lead Model: 4298
Implantable Lead Model: 5076
Implantable Pulse Generator Implant Date: 20230817
Lead Channel Impedance Value: 194.634
Lead Channel Impedance Value: 194.634
Lead Channel Impedance Value: 230.98 Ohm
Lead Channel Impedance Value: 237.865
Lead Channel Impedance Value: 237.865
Lead Channel Impedance Value: 342 Ohm
Lead Channel Impedance Value: 380 Ohm
Lead Channel Impedance Value: 399 Ohm
Lead Channel Impedance Value: 399 Ohm
Lead Channel Impedance Value: 437 Ohm
Lead Channel Impedance Value: 532 Ohm
Lead Channel Impedance Value: 532 Ohm
Lead Channel Impedance Value: 589 Ohm
Lead Channel Impedance Value: 665 Ohm
Lead Channel Impedance Value: 703 Ohm
Lead Channel Impedance Value: 893 Ohm
Lead Channel Impedance Value: 893 Ohm
Lead Channel Impedance Value: 912 Ohm
Lead Channel Pacing Threshold Amplitude: 0.625 V
Lead Channel Pacing Threshold Amplitude: 0.75 V
Lead Channel Pacing Threshold Amplitude: 1.375 V
Lead Channel Pacing Threshold Pulse Width: 0.4 ms
Lead Channel Pacing Threshold Pulse Width: 0.4 ms
Lead Channel Pacing Threshold Pulse Width: 0.4 ms
Lead Channel Sensing Intrinsic Amplitude: 18.625 mV
Lead Channel Sensing Intrinsic Amplitude: 18.625 mV
Lead Channel Sensing Intrinsic Amplitude: 2.125 mV
Lead Channel Sensing Intrinsic Amplitude: 2.125 mV
Lead Channel Setting Pacing Amplitude: 1.5 V
Lead Channel Setting Pacing Amplitude: 2 V
Lead Channel Setting Pacing Amplitude: 2 V
Lead Channel Setting Pacing Pulse Width: 0.4 ms
Lead Channel Setting Pacing Pulse Width: 0.4 ms
Lead Channel Setting Sensing Sensitivity: 0.45 mV
Zone Setting Status: 755011
Zone Setting Status: 755011

## 2022-02-08 LAB — CUP PACEART INCLINIC DEVICE CHECK
Battery Remaining Longevity: 87 mo
Battery Voltage: 3 V
Brady Statistic AP VP Percent: 43.83 %
Brady Statistic AP VS Percent: 0.68 %
Brady Statistic AS VP Percent: 54.02 %
Brady Statistic AS VS Percent: 1.48 %
Brady Statistic RA Percent Paced: 43.57 %
Brady Statistic RV Percent Paced: 95.93 %
Date Time Interrogation Session: 20231121135840
HighPow Impedance: 74 Ohm
Implantable Lead Connection Status: 753985
Implantable Lead Connection Status: 753985
Implantable Lead Connection Status: 753985
Implantable Lead Implant Date: 20160217
Implantable Lead Implant Date: 20160217
Implantable Lead Implant Date: 20160217
Implantable Lead Location: 753858
Implantable Lead Location: 753859
Implantable Lead Location: 753860
Implantable Lead Model: 4298
Implantable Lead Model: 5076
Implantable Pulse Generator Implant Date: 20230817
Lead Channel Impedance Value: 203.256
Lead Channel Impedance Value: 203.256
Lead Channel Impedance Value: 228 Ohm
Lead Channel Impedance Value: 247.358
Lead Channel Impedance Value: 247.358
Lead Channel Impedance Value: 380 Ohm
Lead Channel Impedance Value: 437 Ohm
Lead Channel Impedance Value: 437 Ohm
Lead Channel Impedance Value: 437 Ohm
Lead Channel Impedance Value: 437 Ohm
Lead Channel Impedance Value: 513 Ohm
Lead Channel Impedance Value: 570 Ohm
Lead Channel Impedance Value: 570 Ohm
Lead Channel Impedance Value: 665 Ohm
Lead Channel Impedance Value: 703 Ohm
Lead Channel Impedance Value: 855 Ohm
Lead Channel Impedance Value: 893 Ohm
Lead Channel Impedance Value: 912 Ohm
Lead Channel Pacing Threshold Amplitude: 0.5 V
Lead Channel Pacing Threshold Amplitude: 0.75 V
Lead Channel Pacing Threshold Amplitude: 1.375 V
Lead Channel Pacing Threshold Pulse Width: 0.4 ms
Lead Channel Pacing Threshold Pulse Width: 0.4 ms
Lead Channel Pacing Threshold Pulse Width: 0.4 ms
Lead Channel Sensing Intrinsic Amplitude: 18.625 mV
Lead Channel Sensing Intrinsic Amplitude: 2.25 mV
Lead Channel Sensing Intrinsic Amplitude: 2.5 mV
Lead Channel Sensing Intrinsic Amplitude: 20.5 mV
Lead Channel Setting Pacing Amplitude: 1.5 V
Lead Channel Setting Pacing Amplitude: 2 V
Lead Channel Setting Pacing Amplitude: 2 V
Lead Channel Setting Pacing Pulse Width: 0.4 ms
Lead Channel Setting Pacing Pulse Width: 0.4 ms
Lead Channel Setting Sensing Sensitivity: 0.45 mV
Zone Setting Status: 755011
Zone Setting Status: 755011

## 2022-02-08 NOTE — Patient Instructions (Signed)
Follow up recall placed for 6 months with Dr. Caryl Comes.

## 2022-02-08 NOTE — Progress Notes (Signed)
CRT-D device check in office. Thresholds and sensing consistent with previous device measurements. Lead impedance trends stable over time. Afib lasting 2 hours and 11 minutes noted. No ventricular arrhythmia episodes recorded. Patient bi-ventricularly pacing 95.9% of the time. Device programmed with appropriate safety margins. Heart failure diagnostics reviewed and trends are stable for patient. No changes made this session-pt programmed at chronic settings. Estimated longevity 7.1 years.  Patient enrolled in remote follow up. Plan to check device remotely in 3 months and see in office in 6 months. Patient education completed including shock plan.  91 day check s/p gen change.  Wound site well healed.

## 2022-02-09 ENCOUNTER — Telehealth: Payer: Self-pay

## 2022-02-09 ENCOUNTER — Telehealth: Payer: Self-pay | Admitting: *Deleted

## 2022-02-09 ENCOUNTER — Other Ambulatory Visit: Payer: Medicare Other

## 2022-02-09 ENCOUNTER — Ambulatory Visit: Payer: Medicare Other | Admitting: Oncology

## 2022-02-09 ENCOUNTER — Ambulatory Visit: Payer: Medicare Other

## 2022-02-09 NOTE — Telephone Encounter (Signed)
Patient called Palliative Care asking for referral for Physical Therapy due to leg weakness and having had a couple of falls Please advise

## 2022-02-09 NOTE — Telephone Encounter (Signed)
PC SW returned patients TC.  Patient inquired about exercises she can do for her legs, due to them being very weak. Patient states she wishes to engage in PT if possible.   OUTGOING call: SW outreached oncology office, Dr. Grayland Ormond, to request PT orders/referral for patient. Advised that orders amy not be sent until Monday. SW made patient aware.

## 2022-02-14 ENCOUNTER — Ambulatory Visit: Payer: Medicare Other | Admitting: Oncology

## 2022-02-14 ENCOUNTER — Inpatient Hospital Stay: Payer: Medicare Other

## 2022-02-14 ENCOUNTER — Other Ambulatory Visit: Payer: Self-pay | Admitting: Oncology

## 2022-02-14 ENCOUNTER — Encounter: Payer: Self-pay | Admitting: Oncology

## 2022-02-14 ENCOUNTER — Other Ambulatory Visit: Payer: Self-pay | Admitting: *Deleted

## 2022-02-14 VITALS — BP 172/97 | HR 76 | Temp 98.5°F | Resp 20 | Wt 117.8 lb

## 2022-02-14 DIAGNOSIS — R2689 Other abnormalities of gait and mobility: Secondary | ICD-10-CM

## 2022-02-14 DIAGNOSIS — C678 Malignant neoplasm of overlapping sites of bladder: Secondary | ICD-10-CM

## 2022-02-14 DIAGNOSIS — Z5111 Encounter for antineoplastic chemotherapy: Secondary | ICD-10-CM | POA: Diagnosis not present

## 2022-02-14 DIAGNOSIS — Z23 Encounter for immunization: Secondary | ICD-10-CM | POA: Diagnosis not present

## 2022-02-14 DIAGNOSIS — N3001 Acute cystitis with hematuria: Secondary | ICD-10-CM

## 2022-02-14 DIAGNOSIS — R29898 Other symptoms and signs involving the musculoskeletal system: Secondary | ICD-10-CM

## 2022-02-14 DIAGNOSIS — C679 Malignant neoplasm of bladder, unspecified: Secondary | ICD-10-CM | POA: Diagnosis not present

## 2022-02-14 LAB — COMPREHENSIVE METABOLIC PANEL
ALT: 30 U/L (ref 0–44)
AST: 40 U/L (ref 15–41)
Albumin: 3.8 g/dL (ref 3.5–5.0)
Alkaline Phosphatase: 96 U/L (ref 38–126)
Anion gap: 5 (ref 5–15)
BUN: 11 mg/dL (ref 8–23)
CO2: 29 mmol/L (ref 22–32)
Calcium: 9.2 mg/dL (ref 8.9–10.3)
Chloride: 92 mmol/L — ABNORMAL LOW (ref 98–111)
Creatinine, Ser: 0.5 mg/dL (ref 0.44–1.00)
GFR, Estimated: >60 mL/min (ref >60)
Glucose, Bld: 105 mg/dL — ABNORMAL HIGH (ref 70–99)
Potassium: 4.8 mmol/L (ref 3.5–5.1)
Sodium: 126 mmol/L — ABNORMAL LOW (ref 135–145)
Total Bilirubin: 0.2 mg/dL — ABNORMAL LOW (ref 0.3–1.2)
Total Protein: 7 g/dL (ref 6.5–8.1)

## 2022-02-14 LAB — URINALYSIS, COMPLETE (UACMP) WITH MICROSCOPIC
Bilirubin Urine: NEGATIVE
Glucose, UA: NEGATIVE mg/dL
Ketones, ur: NEGATIVE mg/dL
Nitrite: NEGATIVE
Protein, ur: NEGATIVE mg/dL
Specific Gravity, Urine: 1.004 — ABNORMAL LOW (ref 1.005–1.030)
WBC, UA: >50 WBC/hpf — ABNORMAL HIGH (ref 0–5)
pH: 7 (ref 5.0–8.0)

## 2022-02-14 LAB — CBC WITH DIFFERENTIAL/PLATELET
Abs Immature Granulocytes: 0.01 10*3/uL (ref 0.00–0.07)
Basophils Absolute: 0 10*3/uL (ref 0.0–0.1)
Basophils Relative: 1 %
Eosinophils Absolute: 0.1 10*3/uL (ref 0.0–0.5)
Eosinophils Relative: 2 %
HCT: 32.9 % — ABNORMAL LOW (ref 36.0–46.0)
Hemoglobin: 11 g/dL — ABNORMAL LOW (ref 12.0–15.0)
Immature Granulocytes: 0 %
Lymphocytes Relative: 36 %
Lymphs Abs: 1.3 10*3/uL (ref 0.7–4.0)
MCH: 32.6 pg (ref 26.0–34.0)
MCHC: 33.4 g/dL (ref 30.0–36.0)
MCV: 97.6 fL (ref 80.0–100.0)
Monocytes Absolute: 0.6 10*3/uL (ref 0.1–1.0)
Monocytes Relative: 15 %
Neutro Abs: 1.7 10*3/uL (ref 1.7–7.7)
Neutrophils Relative %: 46 %
Platelets: 208 10*3/uL (ref 150–400)
RBC: 3.37 MIL/uL — ABNORMAL LOW (ref 3.87–5.11)
RDW: 14.6 % (ref 11.5–15.5)
WBC: 3.6 10*3/uL — ABNORMAL LOW (ref 4.0–10.5)
nRBC: 0 % (ref 0.0–0.2)

## 2022-02-14 MED ORDER — GEMCITABINE CHEMO FOR BLADDER INSTILLATION 2000 MG
2000.0000 mg | Freq: Once | INTRAVENOUS | Status: AC
  Administered 2022-02-14: 2000 mg via INTRAVESICAL
  Filled 2022-02-14: qty 52.6

## 2022-02-14 MED ORDER — OXYBUTYNIN CHLORIDE 5 MG PO TABS
5.0000 mg | ORAL_TABLET | Freq: Once | ORAL | Status: AC | PRN
  Administered 2022-02-14: 5 mg via ORAL
  Filled 2022-02-14: qty 1

## 2022-02-14 MED ORDER — LIDOCAINE HCL URETHRAL/MUCOSAL 2 % EX PRSY
1.0000 | PREFILLED_SYRINGE | Freq: Once | CUTANEOUS | Status: DC
  Filled 2022-02-14 (×2): qty 1

## 2022-02-14 MED ORDER — PROCHLORPERAZINE MALEATE 10 MG PO TABS
10.0000 mg | ORAL_TABLET | Freq: Once | ORAL | Status: AC
  Administered 2022-02-14: 10 mg via ORAL
  Filled 2022-02-14: qty 1

## 2022-02-14 MED ORDER — SULFAMETHOXAZOLE-TRIMETHOPRIM 800-160 MG PO TABS: 1.0000 | ORAL_TABLET | Freq: Two times a day (BID) | ORAL | 0 refills | Status: DC

## 2022-02-14 NOTE — Telephone Encounter (Signed)
Attempted to call [place] back and did not get an answer

## 2022-02-14 NOTE — Progress Notes (Signed)
Pt with burning upon urination and leucocytes present in her UA today.  Antibiotic sent to local pharmacy by MD.  Faythe Ghee to proceed with treatment today per Dr. Grayland Ormond.  Foley catheter placed per policy.  Pt tolerated placement well with no complaints.  Gemzar injected into bladder as ordered.  After 30 minutes, pt complained of pain and demanded that the chemo be removed because she could not "hold it any longer".  Catheter unclamped and removed, MD notified.  Pt left infusion suite stable and ambulatory with her walker.

## 2022-02-14 NOTE — Patient Instructions (Signed)
[facility] [place]  Discharge Instructions: Thank you for choosing [facility] to provide your oncology and hematology care.  If you have a lab appointment with the Cancer Center, please go directly to the Cancer Center and check in at the registration area.  Wear comfortable clothing and clothing appropriate for easy access to any Portacath or PICC line.   We strive to give you quality time with your provider. You may need to reschedule your appointment if you arrive late (15 or more minutes).  Arriving late affects you and other patients whose appointments are after yours.  Also, if you miss three or more appointments without notifying the office, you may be dismissed from the clinic at the provider's discretion.      For prescription refill requests, have your pharmacy contact our office and allow 72 hours for refills to be completed.    Today you received the following chemotherapy and/or immunotherapy agents: gemcitabine   To help prevent nausea and vomiting after your treatment, we encourage you to take your nausea medication as directed.  BELOW ARE SYMPTOMS THAT SHOULD BE REPORTED IMMEDIATELY: *FEVER GREATER THAN 100.4 F (38 C) OR HIGHER *CHILLS OR SWEATING *NAUSEA AND VOMITING THAT IS NOT CONTROLLED WITH YOUR NAUSEA MEDICATION *UNUSUAL SHORTNESS OF BREATH *UNUSUAL BRUISING OR BLEEDING *URINARY PROBLEMS (pain or burning when urinating, or frequent urination) *BOWEL PROBLEMS (unusual diarrhea, constipation, pain near the anus) TENDERNESS IN MOUTH AND THROAT WITH OR WITHOUT PRESENCE OF ULCERS (sore throat, sores in mouth, or a toothache) UNUSUAL RASH, SWELLING OR PAIN  UNUSUAL VAGINAL DISCHARGE OR ITCHING   Items with * indicate a potential emergency and should be followed up as soon as possible or go to the Emergency Department if any problems should occur.  Please show the CHEMOTHERAPY ALERT CARD or IMMUNOTHERAPY ALERT CARD at check-in to  the Emergency Department and triage nurse.  Should you have questions after your visit or need to cancel or reschedule your appointment, please contact [facility] [place]  [phone] and follow the prompts.  Office hours are 8:00 a.m. to 4:30 p.m. Monday - Friday. Please note that voicemails left after 4:00 p.m. may not be returned until the following business day.  We are closed weekends and major holidays. You have access to a nurse at all times for urgent questions. Please call the main number to the clinic [phone] and follow the prompts.  For any non-urgent questions, you may also contact your provider using MyChart. We now offer e-Visits for anyone 18 and older to request care online for non-urgent symptoms. For details visit [email or web address].   Also download the MyChart app! Go to the app store, search "MyChart", open the app, select [place], and log in with your MyChart username and password.  Masks are optional in the cancer centers. If you would like for your care team to wear a mask while they are taking care of you, please let them know. For doctor visits, patients may have with them one support person who is at least 82 years old. At this time, visitors are not allowed in the infusion area.   

## 2022-02-15 ENCOUNTER — Ambulatory Visit
Payer: Medicare Other | Attending: Student in an Organized Health Care Education/Training Program | Admitting: Student in an Organized Health Care Education/Training Program

## 2022-02-15 DIAGNOSIS — G894 Chronic pain syndrome: Secondary | ICD-10-CM | POA: Insufficient documentation

## 2022-02-15 DIAGNOSIS — M47816 Spondylosis without myelopathy or radiculopathy, lumbar region: Secondary | ICD-10-CM | POA: Insufficient documentation

## 2022-02-15 DIAGNOSIS — M5416 Radiculopathy, lumbar region: Secondary | ICD-10-CM | POA: Insufficient documentation

## 2022-02-15 MED ORDER — OXYCODONE-ACETAMINOPHEN 10-325 MG PO TABS: 1.0000 | ORAL_TABLET | ORAL | 0 refills | Status: AC | PRN

## 2022-02-15 MED ORDER — OXYCODONE-ACETAMINOPHEN 10-325 MG PO TABS: 1.0000 | ORAL_TABLET | ORAL | 0 refills | Status: DC | PRN

## 2022-02-15 NOTE — Progress Notes (Signed)
Patient: [name]  Service Category: E/M  Provider: [name], MD  DOB: May 07, 1940  DOS: 02/15/2022  Location: Office  MRN: [id number]  Setting: Ambulatory outpatient  Referring Provider: [name], MD  Type: Established Patient  Specialty: Interventional Pain Management  PCP: [name], MD  Location: Remote location  Delivery: TeleHealth     Virtual Encounter - Pain Management PROVIDER NOTE: Information contained herein reflects review and annotations entered in association with encounter. Interpretation of such information and data should be left to medically-trained personnel. Information provided to patient can be located elsewhere in the medical record under "Patient Instructions". Document created using STT-dictation technology, any transcriptional errors that may result from process are unintentional.    Contact & Pharmacy Preferred: [phone] Home: [phone] (home) Mobile: [phone] (mobile) E-mail: [email or web address]  [place], [place] [place] [address] Phone: [phone] Fax: [id number] [name], [place] [address] [place] [address] Phone: [phone] Fax: [phone]   Pre-screening  [name] offered "in-person" vs "virtual" encounter. She indicated preferring virtual for this encounter.   Reason COVID-19*  Social distancing based on CDC and AMA recommendations.   I contacted [name] on 02/15/2022 via telephone.      I clearly identified myself as [name], MD. I verified that I was speaking with the correct person using two identifiers (Name: [name], and date of birth: 1941/03/01).  Consent I sought verbal advanced consent from [name] for virtual visit interactions. I informed [name] of possible security and privacy concerns, risks, and limitations associated with  providing "not-in-person" medical evaluation and management services. I also informed [name] of the availability of "in-person" appointments. Finally, I informed her that there would be a charge for the virtual visit and that she could be  personally, fully or partially, financially responsible for it. [name] expressed understanding and agreed to proceed.   Historic Elements   [name] is a 81 y.o. year old, female patient evaluated today after our last contact on 11/18/2021. [name]  Anderson a past medical history of AICD (automatic cardioverter/defibrillator) present (EP cardiologist--- dr Caryl Comes ), Arthritis, Bladder cancer [facility]) (urologist-  dr Junious Silk), Chronic hyponatremia, Chronic low back pain with bilateral sciatica, Chronic systolic (congestive) heart failure ([place]), 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), Coronary artery disease (cardiologist-  dr Kathlyn Sacramento), DDD (degenerative disc disease), thoracolumbar, Frequent urination, Full dentures, Gait instability, GERD (gastroesophageal reflux disease), History of iron deficiency anemia (11/2013), History of stomach ulcers (11/2013), Hypertension, LBBB (left bundle branch block), NICM (nonischemic cardiomyopathy) ([place]) (last echo 12-31-2015 ef 55-60%), Nocturia more than twice per night, S/P insertion of spinal cord stimulator (followed by [facility]---- Dr Pecola Leisure (notes in care everywhere)), Scoliosis, Uses walker (02/18/2021), and Wears glasses. She also  Anderson a past surgical history that includes Esophagogastroduodenoscopy (N/A, 11/28/2013); Transurethral resection of bladder tumor (N/A, 12/13/2013); Transurethral resection of bladder tumor (N/A, 01/17/2014); Cataract extraction w/ intraocular lens  implant, bilateral (Bilateral, right 12-2013 / left  02-2014); Cystoscopy with retrograde pyelogram, ureteroscopy and stent placement (Bilateral, 04/18/2014); Transurethral resection of bladder tumor (N/A, 04/18/2014); Lumbar disc surgery (1980's);  bi-ventricular implantable cardioverter defibrillator (N/A, 05/07/2014); Cystoscopy w/ retrogrades (Bilateral, 01/06/2015); Cystoscopy with biopsy (N/A, 10/06/2015); Cardiac catheterization (03-03-2014  dr Kathlyn Sacramento   [facility]); Transurethral resection of bladder (  1995); Cardiovascular stress test (11/25/2013); transthoracic echocardiogram (12/31/2015   dr Caryl Comes); Cystoscopy with fulgeration (N/A, 06/30/2017); Spinal cord stimulator implant (10-09-2018   _0 ); Cystoscopy with biopsy (N/A, 02/12/2019); Transurethral resection of bladder tumor (N/A, 12/13/2019); Transurethral resection of bladder tumor (N/A, 10/06/2020); colonscopy (11/2020); Cystoscopy with biopsy (N/A, 03/19/2021); ICD GENERATOR CHANGEOUT (N/A, 11/04/2021); Biv pacemaker generator change out; and Transurethral resection of bladder tumor (N/A, 11/30/2021). [name] Anderson a current medication list which includes the following prescription(s): acetaminophen, amitriptyline, amlodipine, gabapentin, culturelle digestive daily pro, losartan, menthol (topical analgesic), metoprolol succinate, ondansetron, pantoprazole, polyethylene glycol, prochlorperazine, promethazine, sucralfate, sulfamethoxazole-trimethoprim, [START ON 02/24/2022] oxycodone-acetaminophen, and [START ON 03/26/2022] oxycodone-acetaminophen, and the following Facility-Administered Medications: epirubicin (ELLENCE) 50 mg in sodium chloride 0.9 % bladder instillation, epirubicin (ELLENCE) 80 mg in sodium chloride (PF) 0.9 % bladder instillation, gemcitabine, and gemcitabine. She  reports that she Anderson been smoking cigarettes. She Anderson a 28.00 pack-year smoking history. She Anderson never used smokeless tobacco. She reports that she does not drink alcohol and does not use drugs. [name] No Known Allergies.  Estimated body mass index is 17.91 kg/m as calculated from the following:   Height as of 01/03/22: _1  (1.727 m).   Weight as of 02/14/22: 117 lb 12.8 oz (53.4 kg).  HPI  Today, she is  being contacted for medication management.  Patient states that she is out of power and unable to get out of her garage to come to her clinic visit.  For this reason we will be doing a virtual visit. Anderson been seeing Urology and Oncology for bladder cancer Undergoing chemotherapy with Dr Grayland Ormond with Oncology   Pharmacotherapy Assessment   Opioid Analgesic: Percocet 10 mg up to 5 times a day as needed for severe breakthrough pain, quantity 150/month; MME equals 75   Monitoring: [place] PMP: PDMP reviewed during this encounter.       Pharmacotherapy: No side-effects or adverse reactions reported. Compliance: No problems identified. Effectiveness: Clinically acceptable. Plan: Refer to "POC". UDS:  Summary  Date Value Ref Range Status  12/22/2021 Note  Final    Comment:    ==================================================================== [employer] Select 13 (MW) ==================================================================== Specimen Alert Note: Urinary creatinine is low; ability to detect some drugs may be compromised. Interpret results with caution. (Creatinine) ==================================================================== Test                             Result       Flag       Units  Drug Present and Declared for Prescription Verification   Oxycodone                      11927        EXPECTED   ng/mg creat   Oxymorphone                    13545        EXPECTED   ng/mg creat   Noroxycodone                   15409        EXPECTED   ng/mg creat   Noroxymorphone                 3791         EXPECTED   ng/mg creat    Sources of oxycodone are scheduled prescription medications.    Oxymorphone, noroxycodone, and noroxymorphone are  expected    metabolites of oxycodone. Oxymorphone is also available as a    scheduled prescription medication.  ==================================================================== Test                      Result    Flag   Units      Ref  Range   Creatinine              11        LL     mg/dL      >=20 ==================================================================== Declared Medications:  The flagging and interpretation on this report are based on the  following declared medications.  Unexpected results may arise from  inaccuracies in the declared medications.   **Note: The testing scope of this panel includes these medications:   Oxycodone (Percocet)   **Note: The testing scope of this panel does not include the  following reported medications:   Acetaminophen (Tylenol)  Acetaminophen (Percocet)  Amitriptyline (Elavil)  Amlodipine (Norvasc)  Gabapentin (Neurontin)  Losartan (Cozaar)  Menthol  Metoprolol (Toprol)  Pantoprazole (Protonix)  Polyethylene Glycol (MiraLAX)  Promethazine (Phenergan)  Sucralfate (Carafate) ==================================================================== For clinical consultation, please call [phone]. ====================================================================    No results found for: "CBDTHCR", "D8THCCBX", "D9THCCBX"   Laboratory Chemistry Profile   Renal Lab Results  Component Value Date   BUN 11 02/14/2022   CREATININE 0.50 02/14/2022   BCR 18 04/28/2014   GFR 89.19 08/27/2019   GFRAA >60 06/17/2019   GFRNONAA >60 02/14/2022    Hepatic Lab Results  Component Value Date   AST 40 02/14/2022   ALT 30 02/14/2022   ALBUMIN 3.8 02/14/2022   ALKPHOS 96 02/14/2022   LIPASE 30 11/23/2013    Electrolytes Lab Results  Component Value Date   NA 126 (L) 02/14/2022   K 4.8 02/14/2022   CL 92 (L) 02/14/2022   CALCIUM 9.2 02/14/2022   MG 2.3 06/03/2019    Bone Lab Results  Component Value Date   VD25OH 36.17 01/11/2019    Inflammation (CRP: Acute Phase) (ESR: Chronic Phase) No results found for: "CRP", "ESRSEDRATE", "LATICACIDVEN"       Note: Above Lab results reviewed.  Imaging  CUP PACEART INCLINIC DEVICE CHECK CRT-D device check in  office. Thresholds and sensing consistent with previous device measurements. Lead impedance trends stable over time. Afib lasting 2 hours and 11 minutes noted. No ventricular arrhythmia episodes recorded. Patient bi-ventricularly  pacing 95.9% of the time. Device programmed with appropriate safety margins. Heart failure diagnostics reviewed and trends are stable for patient. No changes made this session-pt programmed at chronic settings. Estimated longevity 7.1 years.  Patient  enrolled in remote follow up. Plan to check device remotely in 3 months and see in office in 6 months. Patient education completed including shock plan.  91 day check s/p gen change.  Wound site well healed.[name], BSN, RN CUP PACEART REMOTE DEVICE CHECK Scheduled remote reviewed. Normal device function.   1 AF episode 9/27, duration 2hrs, mean HR 107, burden 0.1%.  Hx of PAF in EPIC, no OAC on MAR Next remote 91 days. LA  Assessment  Diagnoses of Lumbar radiculopathy, Chronic pain syndrome, and Lumbar facet arthropathy were pertinent to this visit.  Plan of Care    [name] Anderson a current medication list which includes the following long-term medication(s): amitriptyline, amlodipine, gabapentin, losartan, metoprolol succinate, prochlorperazine, promethazine, and sucralfate.  Pharmacotherapy (Medications Ordered): Meds ordered this encounter  Medications   oxyCODONE-acetaminophen (PERCOCET)  10-325 MG tablet    Sig: Take 1 tablet by mouth every 4 (four) hours as needed for pain.    Dispense:  150 tablet    Refill:  0   oxyCODONE-acetaminophen (PERCOCET) 10-325 MG tablet    Sig: Take 1 tablet by mouth every 4 (four) hours as needed for pain.    Dispense:  150 tablet    Refill:  0  Continue multimodal analgesics with gabapentin and amitriptyline as prescribed by PCP.   Orders:  No orders of the defined types were placed in this encounter.  Follow-up plan:   Return in about 2 months (around  04/21/2022) for Medication Management, in person.    Recent Visits Date Type Provider Dept  11/18/21 Office Visit [name], MD [facility]  Showing recent visits within past 90 days and meeting all other requirements Today's Visits Date Type Provider Dept  02/15/22 Office Visit [name], MD [facility]  Showing today's visits and meeting all other requirements Future Appointments No visits were found meeting these conditions. Showing future appointments within next 90 days and meeting all other requirements  I discussed the assessment and treatment plan with the patient. The patient was provided an opportunity to ask questions and all were answered. The patient agreed with the plan and demonstrated an understanding of the instructions.  Patient advised to call back or seek an in-person evaluation if the symptoms or condition worsens.  Duration of encounter: 61mnutes.  Note by: BGillis Santa MD Date: 02/15/2022; Time: 3:01 PM

## 2022-02-16 ENCOUNTER — Other Ambulatory Visit: Payer: Self-pay | Admitting: Student

## 2022-02-16 ENCOUNTER — Ambulatory Visit (INDEPENDENT_AMBULATORY_CARE_PROVIDER_SITE_OTHER): Payer: Medicare Other | Admitting: Internal Medicine

## 2022-02-16 ENCOUNTER — Encounter: Payer: Self-pay | Admitting: Internal Medicine

## 2022-02-16 ENCOUNTER — Telehealth: Payer: Self-pay | Admitting: *Deleted

## 2022-02-16 ENCOUNTER — Other Ambulatory Visit: Payer: Self-pay | Admitting: Internal Medicine

## 2022-02-16 VITALS — BP 130/84 | HR 83 | Temp 98.0°F | Ht 68.0 in | Wt 120.1 lb

## 2022-02-16 DIAGNOSIS — I420 Dilated cardiomyopathy: Secondary | ICD-10-CM

## 2022-02-16 DIAGNOSIS — E875 Hyperkalemia: Secondary | ICD-10-CM

## 2022-02-16 DIAGNOSIS — T50905A Adverse effect of unspecified drugs, medicaments and biological substances, initial encounter: Secondary | ICD-10-CM | POA: Diagnosis not present

## 2022-02-16 DIAGNOSIS — G894 Chronic pain syndrome: Secondary | ICD-10-CM

## 2022-02-16 DIAGNOSIS — K117 Disturbances of salivary secretion: Secondary | ICD-10-CM | POA: Diagnosis not present

## 2022-02-16 DIAGNOSIS — I2510 Atherosclerotic heart disease of native coronary artery without angina pectoris: Secondary | ICD-10-CM | POA: Diagnosis not present

## 2022-02-16 DIAGNOSIS — E871 Hypo-osmolality and hyponatremia: Secondary | ICD-10-CM | POA: Diagnosis not present

## 2022-02-16 DIAGNOSIS — M5416 Radiculopathy, lumbar region: Secondary | ICD-10-CM

## 2022-02-16 DIAGNOSIS — M47816 Spondylosis without myelopathy or radiculopathy, lumbar region: Secondary | ICD-10-CM

## 2022-02-16 MED ORDER — RSVPREF3 VAC RECOMB ADJUVANTED 120 MCG/0.5ML IM SUSR: 0.5000 mL | Freq: Once | INTRAMUSCULAR | 0 refills | Status: AC

## 2022-02-16 NOTE — Progress Notes (Unsigned)
Subjective:  Patient ID: [name], female    DOB: [birth year]  Age: 81 y.o. MRN: [id number]  CC: There were no encounter diagnoses.   HPI THAIS SILBERSTEIN presents for  Chief Complaint  Patient presents with   Acute Visit    Mouth sore and dry   1) Hyperkalemia: taking oxycodone  chronically and recently prescribed septra on Nov 13 .  Potassium was elevated on Nov 20,  normalized on Nov 27  2) new onset dizziness:  for the past 2-3 hours she has felt light headed.  She denies headache and  vertigo.  Has not eaten  today.  Gets bladder infusion weekly on Mondays  for 6 weeks  3)    Outpatient Medications Prior to Visit  Medication Sig Dispense Refill   acetaminophen (TYLENOL) 500 MG tablet Take 1,000 mg by mouth every 8 (eight) hours as needed for moderate pain.     amitriptyline (ELAVIL) 50 MG tablet Take 1 tablet (50 mg total) by mouth at bedtime. 90 tablet 1   amLODipine (NORVASC) 5 MG tablet Take 1 tablet (5 mg total) by mouth daily. 90 tablet 2   gabapentin (NEURONTIN) 300 MG capsule Take 2 capsules (600 mg total) by mouth 3 (three) times daily. (Patient taking differently: Take 900-1,200 mg by mouth at bedtime.) 270 capsule 1   Lactobacillus-Inulin (CULTURELLE DIGESTIVE DAILY PRO) CAPS Take 1 capsule by mouth daily. 30 capsule 0   losartan (COZAAR) 100 MG tablet Take 1 tablet (100 mg total) by mouth at bedtime. 90 tablet 1   Menthol, Topical Analgesic, (BIOFREEZE EX) Apply 1 application topically 4 (four) times daily as needed (back pain).     metoprolol succinate (TOPROL-XL) 25 MG 24 hr tablet TAKE 1 TABLET(25 MG) BY MOUTH DAILY (Patient taking differently: Take 25 mg by mouth daily.) 90 tablet 3   ondansetron (ZOFRAN) 8 MG tablet Take 1 tablet (8 mg total) by mouth every 8 (eight) hours as needed for nausea or vomiting. 30 tablet 1   [START ON 02/24/2022] oxyCODONE-acetaminophen (PERCOCET) 10-325 MG tablet Take 1 tablet by mouth every 4 (four) hours as needed for pain. 150  tablet 0   [START ON 03/26/2022] oxyCODONE-acetaminophen (PERCOCET) 10-325 MG tablet Take 1 tablet by mouth every 4 (four) hours as needed for pain. 150 tablet 0   pantoprazole (PROTONIX) 40 MG tablet Take 40 mg by mouth at bedtime.     polyethylene glycol (MIRALAX / GLYCOLAX) packet Take 17 g by mouth daily as needed for mild constipation.      prochlorperazine (COMPAZINE) 10 MG tablet Take 1 tablet (10 mg total) by mouth every 6 (six) hours as needed for nausea or vomiting. 30 tablet 1   promethazine (PHENERGAN) 12.5 MG tablet TAKE 1 TABLET(12.5 MG) BY MOUTH EVERY 8 HOURS AS NEEDED FOR NAUSEA OR VOMITING 30 tablet 2   sucralfate (CARAFATE) 1 g tablet TAKE 1 TABLET BY MOUTH ONE HOUR BEFORE EACH MEAL AND TWO HOURS AFTER OTHER MEDICATIONS (Patient taking differently: Take 1 g by mouth See admin instructions. Take 1 tablet one hour before each meal and 2 hours after all other medications) 90 tablet 1   sulfamethoxazole-trimethoprim (BACTRIM DS) 800-160 MG tablet Take 1 tablet by mouth 2 (two) times daily. 14 tablet 0   Facility-Administered Medications Prior to Visit  Medication Dose Route Frequency Provider Last Rate Last Admin   epirubicin (ELLENCE) 50 mg in sodium chloride 0.9 % bladder instillation  50 mg Bladder Instillation Once [name], MD  epirubicin (ELLENCE) 80 mg in sodium chloride (PF) 0.9 % bladder instillation  80 mg Bladder Instillation Once Nyoka Cowden, Terri L, RPH       gemcitabine (GEMZAR) chemo syringe for bladder instillation 2,000 mg  2,000 mg Bladder Instillation Once [name], MD       gemcitabine [facility]) chemo syringe for bladder instillation 2,000 mg  2,000 mg Bladder Instillation Once [name], MD        Review of Systems;  Patient denies headache, fevers, malaise, unintentional weight loss, skin rash, eye pain, sinus congestion and sinus pain, sore throat, dysphagia,  hemoptysis , cough, dyspnea, wheezing, chest pain, palpitations, orthopnea,  edema, abdominal pain, nausea, melena, diarrhea, constipation, flank pain, dysuria, hematuria, urinary  Frequency, nocturia, numbness, tingling, seizures,  Focal weakness, Loss of consciousness,  Tremor, insomnia, depression, anxiety, and suicidal ideation.      Objective:  BP (!) 140/86   Pulse 83   Temp 98 F (36.7 C) (Oral)   Ht '5\' 8"'$  (1.727 m)   Wt 120 lb 1.9 oz (54.5 kg)   SpO2 94%   BMI 18.26 kg/m   BP Readings from Last 3 Encounters:  02/16/22 (!) 140/86  02/14/22 (!) 172/97  02/07/22 (!) 136/91    Wt Readings from Last 3 Encounters:  02/16/22 120 lb 1.9 oz (54.5 kg)  02/14/22 117 lb 12.8 oz (53.4 kg)  02/07/22 117 lb 8.1 oz (53.3 kg)    General appearance: alert, cooperative and appears stated age Ears: normal TM's and external ear canals both ears Throat: lips, mucosa, and tongue normal; teeth and gums normal Neck: no adenopathy, no carotid bruit, supple, symmetrical, trachea midline and thyroid not enlarged, symmetric, no tenderness/mass/nodules Back: symmetric, no curvature. ROM normal. No CVA tenderness. Lungs: clear to auscultation bilaterally Heart: regular rate and rhythm, S1, S2 normal, no murmur, click, rub or gallop Abdomen: soft, non-tender; bowel sounds normal; no masses,  no organomegaly Pulses: 2+ and symmetric Skin: Skin color, texture, turgor normal. No rashes or lesions Lymph nodes: Cervical, supraclavicular, and axillary nodes normal. Neuro:  awake and interactive with normal mood and affect. Higher cortical functions are normal. Speech is clear without word-finding difficulty or dysarthria. Extraocular movements are intact. Visual fields of both eyes are grossly intact. Sensation to light touch is grossly intact bilaterally of upper and lower extremities. Motor examination shows 4+/5 symmetric hand grip and upper extremity and 5/5 lower extremity strength. There is no pronation or drift. Gait is non-ataxic   Lab Results  Component Value Date    HGBA1C 4.6 12/27/2013   HGBA1C 5.7 (H) 08/31/2010    Lab Results  Component Value Date   CREATININE 0.50 02/14/2022   CREATININE 0.83 02/07/2022   CREATININE 0.60 01/31/2022    Lab Results  Component Value Date   WBC 3.6 (L) 02/14/2022   HGB 11.0 (L) 02/14/2022   HCT 32.9 (L) 02/14/2022   PLT 208 02/14/2022   GLUCOSE 105 (H) 02/14/2022   CHOL 120 01/11/2019   TRIG 76.0 01/11/2019   HDL 56.90 01/11/2019   LDLCALC 48 01/11/2019   ALT 30 02/14/2022   AST 40 02/14/2022   NA 126 (L) 02/14/2022   K 4.8 02/14/2022   CL 92 (L) 02/14/2022   CREATININE 0.50 02/14/2022   BUN 11 02/14/2022   CO2 29 02/14/2022   TSH 0.67 10/10/2016   INR 0.9 11/22/2021   HGBA1C 4.6 12/27/2013    No results found.  Assessment & Plan:   Problem List Items Addressed This  Visit   None   I spent a total of   minutes with this patient in a face to face visit on the date of this encounter reviewing the last office visit with me in       ,  most recent visit with cardiology ,    ,  patient's diet and exercise habits, home blood pressure /blod sugar readings, recent ER visit including labs and imaging studies ,   and post visit ordering of testing and therapeutics.    Follow-up: No follow-ups on file.   [name], MD

## 2022-02-16 NOTE — Patient Instructions (Addendum)
The high potassium was likely due to the combination of Septra (antibiotic, also called sulfamethoxazole )  and your chronic oxycodone, so it can recur   .  If your potassium is high today I will change your antibiotic (Dr Grayland Ormond is aware)   I recommend using a different antibiotic the next time  (cipro) so we don't have to keep checking   Rinsing with cold water, sucking on ice chips, and chewing sugarless gum to increase salivation may provide comfort. For more severe cases, artificial saliva or other oral moisturizers (solution, spray, or gel) may also be useful.     Biotene is a mouth spray that can be used to treat dry mouth (xerostomia).  It's available as a spray, lozenge,  and paste and you can buy it any drug store over the counter   You are up to date on pneumonia vaccines  I do recommend the RSV vaccine for you, .  It is now available through the office If you are younger than 52. You can also check with [facility], [facility] and [facility]

## 2022-02-16 NOTE — Telephone Encounter (Signed)
Patient called to report that she had missed a phone call Dr. Olena Leatherwood office did not contact her.

## 2022-02-17 ENCOUNTER — Telehealth: Payer: Self-pay | Admitting: Internal Medicine

## 2022-02-17 ENCOUNTER — Encounter: Payer: Self-pay | Admitting: Oncology

## 2022-02-17 DIAGNOSIS — T50905A Adverse effect of unspecified drugs, medicaments and biological substances, initial encounter: Secondary | ICD-10-CM | POA: Insufficient documentation

## 2022-02-17 DIAGNOSIS — K117 Disturbances of salivary secretion: Secondary | ICD-10-CM | POA: Insufficient documentation

## 2022-02-17 LAB — BASIC METABOLIC PANEL
BUN: 13 mg/dL (ref 6–23)
CO2: 28 mEq/L (ref 19–32)
Calcium: 8.8 mg/dL (ref 8.4–10.5)
Chloride: 90 mEq/L — ABNORMAL LOW (ref 96–112)
Creatinine, Ser: 0.66 mg/dL (ref 0.40–1.20)
GFR: 81.41 mL/min (ref >60.00)
Glucose, Bld: 103 mg/dL — ABNORMAL HIGH (ref 70–99)
Potassium: 4.9 mEq/L (ref 3.5–5.1)
Sodium: 124 mEq/L — ABNORMAL LOW (ref 135–145)

## 2022-02-17 NOTE — Assessment & Plan Note (Signed)
Secondary to chemotherapy and concurrent use of oxycodone.  Biotene recommended

## 2022-02-17 NOTE — Assessment & Plan Note (Addendum)
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 . encuraged to increase hydration with gatorade

## 2022-02-17 NOTE — Telephone Encounter (Signed)
Pt would like to be called in regards to a conversation she had with the provider on yesterday about her dizziness

## 2022-02-17 NOTE — Assessment & Plan Note (Signed)
Secondary to concurrent use of septra and oxycodone.  Resolved with cessation of abx.  She is currently taking septra again  with upper end of     normal potassium.  Will recommend using cipro in the future  Lab Results  Component Value Date   NA 124 (L) 02/16/2022   K 4.9 02/16/2022   CL 90 (L) 02/16/2022   CO2 28 02/16/2022

## 2022-02-18 ENCOUNTER — Telehealth: Payer: Self-pay

## 2022-02-18 DIAGNOSIS — M5115 Intervertebral disc disorders with radiculopathy, thoracolumbar region: Secondary | ICD-10-CM | POA: Diagnosis not present

## 2022-02-18 DIAGNOSIS — M419 Scoliosis, unspecified: Secondary | ICD-10-CM | POA: Diagnosis not present

## 2022-02-18 DIAGNOSIS — M159 Polyosteoarthritis, unspecified: Secondary | ICD-10-CM | POA: Diagnosis not present

## 2022-02-18 DIAGNOSIS — R351 Nocturia: Secondary | ICD-10-CM | POA: Diagnosis not present

## 2022-02-18 DIAGNOSIS — C678 Malignant neoplasm of overlapping sites of bladder: Secondary | ICD-10-CM | POA: Diagnosis not present

## 2022-02-18 DIAGNOSIS — R296 Repeated falls: Secondary | ICD-10-CM | POA: Diagnosis not present

## 2022-02-18 DIAGNOSIS — I5022 Chronic systolic (congestive) heart failure: Secondary | ICD-10-CM | POA: Diagnosis not present

## 2022-02-18 DIAGNOSIS — Z79891 Long term (current) use of opiate analgesic: Secondary | ICD-10-CM | POA: Diagnosis not present

## 2022-02-18 DIAGNOSIS — Z9682 Presence of neurostimulator: Secondary | ICD-10-CM | POA: Diagnosis not present

## 2022-02-18 DIAGNOSIS — J449 Chronic obstructive pulmonary disease, unspecified: Secondary | ICD-10-CM | POA: Diagnosis not present

## 2022-02-18 DIAGNOSIS — F17210 Nicotine dependence, cigarettes, uncomplicated: Secondary | ICD-10-CM | POA: Diagnosis not present

## 2022-02-18 DIAGNOSIS — K219 Gastro-esophageal reflux disease without esophagitis: Secondary | ICD-10-CM | POA: Diagnosis not present

## 2022-02-18 DIAGNOSIS — Z9181 History of falling: Secondary | ICD-10-CM | POA: Diagnosis not present

## 2022-02-18 DIAGNOSIS — J439 Emphysema, unspecified: Secondary | ICD-10-CM | POA: Diagnosis not present

## 2022-02-18 DIAGNOSIS — E871 Hypo-osmolality and hyponatremia: Secondary | ICD-10-CM | POA: Diagnosis not present

## 2022-02-18 DIAGNOSIS — I428 Other cardiomyopathies: Secondary | ICD-10-CM | POA: Diagnosis not present

## 2022-02-18 DIAGNOSIS — G8929 Other chronic pain: Secondary | ICD-10-CM | POA: Diagnosis not present

## 2022-02-18 DIAGNOSIS — I2510 Atherosclerotic heart disease of native coronary artery without angina pectoris: Secondary | ICD-10-CM | POA: Diagnosis not present

## 2022-02-18 DIAGNOSIS — I110 Hypertensive heart disease with heart failure: Secondary | ICD-10-CM | POA: Diagnosis not present

## 2022-02-18 DIAGNOSIS — R350 Frequency of micturition: Secondary | ICD-10-CM | POA: Diagnosis not present

## 2022-02-18 DIAGNOSIS — I447 Left bundle-branch block, unspecified: Secondary | ICD-10-CM | POA: Diagnosis not present

## 2022-02-18 NOTE — Telephone Encounter (Signed)
Patient states she is returning Cordelia Pen, RN's call regarding her results.  I transferred call to [facility]

## 2022-02-21 ENCOUNTER — Telehealth: Payer: Self-pay | Admitting: Internal Medicine

## 2022-02-21 ENCOUNTER — Inpatient Hospital Stay: Payer: Medicare Other | Attending: Oncology

## 2022-02-21 ENCOUNTER — Inpatient Hospital Stay (HOSPITAL_BASED_OUTPATIENT_CLINIC_OR_DEPARTMENT_OTHER): Payer: Medicare Other | Admitting: Oncology

## 2022-02-21 ENCOUNTER — Inpatient Hospital Stay: Payer: Medicare Other

## 2022-02-21 ENCOUNTER — Encounter: Payer: Self-pay | Admitting: Oncology

## 2022-02-21 DIAGNOSIS — C679 Malignant neoplasm of bladder, unspecified: Secondary | ICD-10-CM | POA: Diagnosis not present

## 2022-02-21 DIAGNOSIS — C678 Malignant neoplasm of overlapping sites of bladder: Secondary | ICD-10-CM

## 2022-02-21 DIAGNOSIS — Z5111 Encounter for antineoplastic chemotherapy: Secondary | ICD-10-CM | POA: Diagnosis not present

## 2022-02-21 DIAGNOSIS — I110 Hypertensive heart disease with heart failure: Secondary | ICD-10-CM | POA: Insufficient documentation

## 2022-02-21 DIAGNOSIS — I5022 Chronic systolic (congestive) heart failure: Secondary | ICD-10-CM | POA: Insufficient documentation

## 2022-02-21 DIAGNOSIS — F17210 Nicotine dependence, cigarettes, uncomplicated: Secondary | ICD-10-CM | POA: Insufficient documentation

## 2022-02-21 LAB — COMPREHENSIVE METABOLIC PANEL
ALT: 27 U/L (ref 0–44)
AST: 38 U/L (ref 15–41)
Albumin: 3.8 g/dL (ref 3.5–5.0)
Alkaline Phosphatase: 88 U/L (ref 38–126)
Anion gap: 8 (ref 5–15)
BUN: 11 mg/dL (ref 8–23)
CO2: 27 mmol/L (ref 22–32)
Calcium: 9.4 mg/dL (ref 8.9–10.3)
Chloride: 95 mmol/L — ABNORMAL LOW (ref 98–111)
Creatinine, Ser: 0.68 mg/dL (ref 0.44–1.00)
GFR, Estimated: >60 mL/min (ref >60)
Glucose, Bld: 92 mg/dL (ref 70–99)
Potassium: 4.9 mmol/L (ref 3.5–5.1)
Sodium: 130 mmol/L — ABNORMAL LOW (ref 135–145)
Total Bilirubin: 0.3 mg/dL (ref 0.3–1.2)
Total Protein: 6.7 g/dL (ref 6.5–8.1)

## 2022-02-21 LAB — URINALYSIS, COMPLETE (UACMP) WITH MICROSCOPIC
Bilirubin Urine: NEGATIVE
Glucose, UA: NEGATIVE mg/dL
Hgb urine dipstick: NEGATIVE
Ketones, ur: NEGATIVE mg/dL
Nitrite: NEGATIVE
Protein, ur: 30 mg/dL — AB
Specific Gravity, Urine: 1.006 (ref 1.005–1.030)
WBC, UA: >50 WBC/hpf — ABNORMAL HIGH (ref 0–5)
pH: 7 (ref 5.0–8.0)

## 2022-02-21 LAB — CBC WITH DIFFERENTIAL/PLATELET
Abs Immature Granulocytes: 0.01 10*3/uL (ref 0.00–0.07)
Basophils Absolute: 0 10*3/uL (ref 0.0–0.1)
Basophils Relative: 1 %
Eosinophils Absolute: 0.1 10*3/uL (ref 0.0–0.5)
Eosinophils Relative: 2 %
HCT: 31.9 % — ABNORMAL LOW (ref 36.0–46.0)
Hemoglobin: 10.7 g/dL — ABNORMAL LOW (ref 12.0–15.0)
Immature Granulocytes: 0 %
Lymphocytes Relative: 42 %
Lymphs Abs: 1.3 10*3/uL (ref 0.7–4.0)
MCH: 32.8 pg (ref 26.0–34.0)
MCHC: 33.5 g/dL (ref 30.0–36.0)
MCV: 97.9 fL (ref 80.0–100.0)
Monocytes Absolute: 0.4 10*3/uL (ref 0.1–1.0)
Monocytes Relative: 13 %
Neutro Abs: 1.3 10*3/uL — ABNORMAL LOW (ref 1.7–7.7)
Neutrophils Relative %: 42 %
Platelets: 114 10*3/uL — ABNORMAL LOW (ref 150–400)
RBC: 3.26 MIL/uL — ABNORMAL LOW (ref 3.87–5.11)
RDW: 15.1 % (ref 11.5–15.5)
WBC: 3.1 10*3/uL — ABNORMAL LOW (ref 4.0–10.5)
nRBC: 0 % (ref 0.0–0.2)

## 2022-02-21 MED ORDER — OXYBUTYNIN CHLORIDE 5 MG PO TABS
5.0000 mg | ORAL_TABLET | Freq: Once | ORAL | Status: AC | PRN
  Administered 2022-02-21: 5 mg via ORAL
  Filled 2022-02-21: qty 1

## 2022-02-21 MED ORDER — PROCHLORPERAZINE MALEATE 10 MG PO TABS
10.0000 mg | ORAL_TABLET | Freq: Once | ORAL | Status: AC
  Administered 2022-02-21: 10 mg via ORAL
  Filled 2022-02-21: qty 1

## 2022-02-21 MED ORDER — GEMCITABINE CHEMO FOR BLADDER INSTILLATION 2000 MG
2000.0000 mg | Freq: Once | INTRAVENOUS | Status: AC
  Administered 2022-02-21: 2000 mg via INTRAVESICAL
  Filled 2022-02-21: qty 52.6

## 2022-02-21 MED ORDER — LIDOCAINE HCL URETHRAL/MUCOSAL 2 % EX GEL: 1.0000 | Freq: Once | CUTANEOUS | Status: DC

## 2022-02-21 NOTE — Progress Notes (Signed)
[place]  [phone] Fax:(336) [phone]  ID: [name] OB: 28-Sep-1940  MR#: [id number]  JAS#:[id number]  Patient Care Team: [name], MD as PCP - General (Internal Medicine) Deboraha Sprang, MD as PCP - Electrophysiology (Cardiology) Briscoe Deutscher, MD (Family Medicine)  CHIEF COMPLAINT: Minimally invasive bladder cancer.  INTERVAL HISTORY: Patient returns to clinic today for further evaluation and consideration of her sixth and final infusion of intervesicular gemcitabine.  She is tolerating her treatments relatively well only with occasional bladder spasms and a sensation of urgency.  She has no neurologic complaints.  She denies any recent fevers or illnesses.  She has a good appetite and denies weight loss.  She has no chest pain, shortness of breath, cough, or hemoptysis.  She denies any nausea, vomiting, constipation, or diarrhea.  She has no urinary complaints.  Patient offers no further specific complaints today.  REVIEW OF SYSTEMS:   Review of Systems  Constitutional: Negative.  Negative for fever, malaise/fatigue and weight loss.  Respiratory: Negative.  Negative for cough, hemoptysis and shortness of breath.   Cardiovascular: Negative.  Negative for chest pain and leg swelling.  Gastrointestinal: Negative.  Negative for abdominal pain.  Genitourinary: Negative.  Negative for dysuria, frequency and hematuria.  Musculoskeletal: Negative.  Negative for back pain.  Skin: Negative.  Negative for rash.  Neurological: Negative.  Negative for dizziness, focal weakness, weakness and headaches.  Psychiatric/Behavioral: Negative.  The patient is not nervous/anxious.     As per HPI. Otherwise, a complete review of systems is negative.  PAST MEDICAL HISTORY: Past Medical History:  Diagnosis Date   AICD (automatic cardioverter/defibrillator) present EP cardiologist--- dr Caryl Comes    placement 05-07-2014 , ef 25%,  NICM/  (02-11-2019 last echo  08-31-2018 ef 60-65%)   Arthritis    "in about all my joints; for sure in my back"   Bladder cancer [facility]) urologist-  dr Junious Silk   dx 1995--  recurrent bladder cancer 2015 , s/p TURBT's and chemo instillation's ;   04/ 2019  s/p TURBT   Chronic hyponatremia    Chronic low back pain with bilateral sciatica    s/p  spinal cord stimulator @ [facility]  [phone]   Chronic systolic (congestive) heart failure [facility])    cardiologist-  dr Rogue Jury   COPD with emphysema ([place])    (02-11-2019  per pt has never been on oxygen)   Coronary artery disease cardiologist-  dr Kathlyn [place]   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% per cardiac cath 03-03-2014   DDD (degenerative disc disease), thoracolumbar    Frequent urination    Full dentures    Gait instability    due to chronic low back pain, uses roller walker   GERD (gastroesophageal reflux disease)    History of iron deficiency anemia 11/2013   resolved w/ IV Iron  (02-11-2019  per pt has not had any issues since 2015)   History of stomach ulcers 11/2013   Hypertension    LBBB (left bundle branch block)    NICM (nonischemic cardiomyopathy) ([place]) last echo 12-31-2015 ef 55-60%   dx 09/ 2015 per echo 20%;  myoview 09/ 2015 ef 28%;  per cardiac cath 12/ 2015 ef 30%;     Nocturia more than twice per night    S/P insertion of spinal cord stimulator followed by [facility]---- Dr Pecola Leisure (notes in care everywhere)   10-09-2018  '@[facility]'$ --- thoracic spinal cord stimular/ generator  (device from [facility])---- per pt  has a control   Scoliosis    Uses walker 02/18/2021   all the time   Wears glasses    for reading    PAST SURGICAL HISTORY: Past Surgical History:  Procedure Laterality Date   BI-VENTRICULAR IMPLANTABLE CARDIOVERTER DEFIBRILLATOR N/A 05/07/2014   Procedure: BI-VENTRICULAR IMPLANTABLE CARDIOVERTER DEFIBRILLATOR  (CRT-D);  Surgeon: Deboraha Sprang, MD;  Location: [facility] CATH LAB;  Service: Cardiovascular;  Laterality: N/A;   BIV PACEMAKER GENERATOR  CHANGE OUT     CARDIAC CATHETERIZATION  03-03-2014  dr Kathlyn [place]   [facility]   pLAD 20%, pRCA 20%, dRCA 50%, RPLS 50%;  ef 30%, mild elevated LVEDP, mild gradient across aortic valve LVOT   CARDIOVASCULAR STRESS TEST  11/25/2013   High risk nuclear study w/ large high severity inferior wall perfusion defect on stress and rest images, large mild severity anteroseptal wall perfusion defect on stress and rest images, No inducible ischemia/ global moderate hypokinesis, ef 28%   CATARACT EXTRACTION W/ INTRAOCULAR LENS  IMPLANT, BILATERAL Bilateral right 12-2013 / left  02-2014   colonscopy  11/2020   benign polyps removed   CYSTOSCOPY W/ RETROGRADES Bilateral 01/06/2015   Procedure: CYSTOSCOPY WITH  BLADDER BIOPSY BILATERAL RETROGRADE PYELOGRAM,INSTILLATION OF MITOMYCIN C;  Surgeon: [name], MD;  Location: [facility] ORS;  Service: Urology;  Laterality: Bilateral;   CYSTOSCOPY WITH BIOPSY N/A 10/06/2015   Procedure: CYSTO WITH BLADDER BIOPSY, FULGERATION, CHEMO IRRIGATION EPIRUBICIN IN PACU;  Surgeon: [name], MD;  Location: [facility];  Service: Urology;  Laterality: N/A;   CYSTOSCOPY WITH BIOPSY N/A 02/12/2019   Procedure: CYSTOSCOPY WITH BIOPSY/ FULGURATION/ INSTILLATION OF GEMCITABINE, bilateral retrograde turbt greater 5cm;  Surgeon: [name], MD;  Location: [facility];  Service: Urology;  Laterality: N/A;   CYSTOSCOPY WITH BIOPSY N/A 03/19/2021   Procedure: CYSTOSCOPY WITH TRANSURETHRAL RESECTION OF BLADDER TUMOR  GREATER THAN 5CM WITH POST-OPERATIVE INSTILLATION OF EPIRUBICIN;  Surgeon: [name], MD;  Location: [facility];  Service: Urology;  Laterality: N/A;   CYSTOSCOPY WITH FULGERATION N/A 06/30/2017   Procedure: Marland Kitchen CYSTOSCOPY WITH [place] FULGERATION/ BLADDER BIOPSY/ INSTILLATION OF EPIRUBICIN;  Surgeon: [name], MD;  Location: [facility];  Service: Urology;  Laterality: N/A;   CYSTOSCOPY WITH RETROGRADE PYELOGRAM,  URETEROSCOPY AND STENT PLACEMENT Bilateral 04/18/2014   Procedure: CYSTOSCOPY WITH RETROGRADE PYELOGRAM;  Surgeon: [name], MD;  Location: [facility];  Service: Urology;  Laterality: Bilateral;   ESOPHAGOGASTRODUODENOSCOPY N/A 11/28/2013   Procedure: ESOPHAGOGASTRODUODENOSCOPY (EGD);  Surgeon: Arta Silence, MD;  Location: [facility] ENDOSCOPY;  Service: Endoscopy;  Laterality: N/A;   ICD GENERATOR CHANGEOUT N/A 11/04/2021   Procedure: ICD GENERATOR CHANGEOUT;  Surgeon: Deboraha Sprang, MD;  Location: [place] CV LAB;  Service: Cardiovascular;  Laterality: N/A;   LUMBAR DISC SURGERY  1980's   "ruptured disc"   SPINAL CORD STIMULATOR IMPLANT  10-09-2018   '@[facility]'$    Thoracic spinal cord stimular/ genertor  (left flank)----- (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[employer])   TRANSTHORACIC ECHOCARDIOGRAM  12/31/2015   dr Caryl Comes   ef 40-973, grade 1 diastolic dysfunction/ mild MR/ septal motion showed abnormal function and dyssynergy   TRANSURETHRAL RESECTION OF BLADDER  1995   TRANSURETHRAL RESECTION OF BLADDER TUMOR N/A 12/13/2013   Procedure: TRANSURETHRAL RESECTION OF BLADDER TUMOR (TURBT);  Surgeon: [name], MD;  [facility];  Service: Urology;  Laterality: N/A;   TRANSURETHRAL RESECTION OF BLADDER TUMOR N/A 01/17/2014   Procedure: TRANSURETHRAL RESECTION OF BLADDER TUMOR (TURBT);  Surgeon: [name], MD;  Location: [facility];  Service: Urology;  Laterality: N/A;   TRANSURETHRAL RESECTION OF BLADDER TUMOR N/A 04/18/2014   Procedure: TRANSURETHRAL RESECTION OF BLADDER TUMOR (TURBT), CYSTOGRAM;  Surgeon: [name], MD;  [facility];  Service: Urology;  Laterality: N/A;   TRANSURETHRAL RESECTION OF BLADDER TUMOR N/A 12/13/2019   Procedure: TRANSURETHRAL RESECTION OF BLADDER TUMOR (TURBT) GREATER THAN 5CM WITH CYSTOSCOPY/ POST OPERATIVE INSTILLATION OF GEMCITABINE;  Surgeon: [name], MD;  Location: [facility];  Service: Urology;  Laterality: N/A;   TRANSURETHRAL  RESECTION OF BLADDER TUMOR N/A 10/06/2020   Procedure: TRANSURETHRAL RESECTION OF BLADDER TUMOR (TURBT)/ CYSTOSCOPY/  BLADDER BIOPSY;  Surgeon: [name], MD;  [facility];  Service: Urology;  Laterality: N/A;   TRANSURETHRAL RESECTION OF BLADDER TUMOR N/A 11/30/2021   Procedure: TRANSURETHRAL RESECTION OF BLADDER TUMOR (TURBT) WITH POST OPERATIVE INSTILLATION OF GEMCITABINE;  Surgeon: [name], MD;  [facility];  Service: Urology;  Laterality: N/A;    FAMILY HISTORY: Family History  Problem Relation Age of Onset   Cancer Mother    Cancer Brother     ADVANCED DIRECTIVES (Y/N):  N  HEALTH MAINTENANCE: Social History   Tobacco Use   Smoking status: Every Day    Packs/day: 0.50    Years: 56.00    Total pack years: 28.00    Types: Cigarettes   Smokeless tobacco: Never   Tobacco comments:    02-11-2019  per pt currently smokes 10 cig's per day , started smoking age 12  Vaping Use   Vaping Use: Never used  Substance Use Topics   Alcohol use: No   Drug use: No     Colonoscopy:  PAP:  Bone density:  Lipid panel:  No Known Allergies  Current Outpatient Medications  Medication Sig Dispense Refill   acetaminophen (TYLENOL) 500 MG tablet Take 1,000 mg by mouth every 8 (eight) hours as needed for moderate pain.     amitriptyline (ELAVIL) 50 MG tablet Take 1 tablet (50 mg total) by mouth at bedtime. 90 tablet 1   amLODipine (NORVASC) 5 MG tablet Take 1 tablet (5 mg total) by mouth daily. 90 tablet 2   gabapentin (NEURONTIN) 300 MG capsule TAKE 2 CAPSULES(600 MG) BY MOUTH THREE TIMES DAILY 270 capsule 1   Lactobacillus-Inulin (CULTURELLE DIGESTIVE DAILY PRO) CAPS Take 1 capsule by mouth daily. 30 capsule 0   losartan (COZAAR) 100 MG tablet Take 1 tablet (100 mg total) by mouth at bedtime. 90 tablet 1   Menthol, Topical Analgesic, (BIOFREEZE EX) Apply 1 application topically 4 (four) times daily as needed (back pain).     metoprolol succinate (TOPROL-XL) 25  MG 24 hr tablet TAKE 1 TABLET(25 MG) BY MOUTH DAILY 90 tablet 3   ondansetron (ZOFRAN) 8 MG tablet Take 1 tablet (8 mg total) by mouth every 8 (eight) hours as needed for nausea or vomiting. 30 tablet 1   [START ON 02/24/2022] oxyCODONE-acetaminophen (PERCOCET) 10-325 MG tablet Take 1 tablet by mouth every 4 (four) hours as needed for pain. 150 tablet 0   [START ON 03/26/2022] oxyCODONE-acetaminophen (PERCOCET) 10-325 MG tablet Take 1 tablet by mouth every 4 (four) hours as needed for pain. 150 tablet 0   pantoprazole (PROTONIX) 40 MG tablet Take 40 mg by mouth at bedtime.     polyethylene glycol (MIRALAX / GLYCOLAX) packet Take 17 g by mouth daily as needed for mild constipation.      prochlorperazine (COMPAZINE) 10 MG tablet Take 1 tablet (10 mg total) by mouth  every 6 (six) hours as needed for nausea or vomiting. 30 tablet 1   promethazine (PHENERGAN) 12.5 MG tablet TAKE 1 TABLET(12.5 MG) BY MOUTH EVERY 8 HOURS AS NEEDED FOR NAUSEA OR VOMITING 30 tablet 2   sucralfate (CARAFATE) 1 g tablet TAKE 1 TABLET BY MOUTH ONE HOUR BEFORE EACH MEAL AND TWO HOURS AFTER OTHER MEDICATIONS (Patient taking differently: Take 1 g by mouth See admin instructions. Take 1 tablet one hour before each meal and 2 hours after all other medications) 90 tablet 1   No current facility-administered medications for this visit.   Facility-Administered Medications Ordered in Other Visits  Medication Dose Route Frequency Provider Last Rate Last Admin   epirubicin (ELLENCE) 50 mg in sodium chloride 0.9 % bladder instillation  50 mg Bladder Instillation Once [name], MD       epirubicin (ELLENCE) 80 mg in sodium chloride (PF) 0.9 % bladder instillation  80 mg Bladder Instillation Once Nyoka Cowden, Terri L, RPH       gemcitabine (GEMZAR) chemo syringe for bladder instillation 2,000 mg  2,000 mg Bladder Instillation Once [name], MD       gemcitabine [facility]) chemo syringe for bladder instillation 2,000 mg  2,000 mg  Bladder Instillation Once [name], MD       gemcitabine [facility]) chemo syringe for bladder instillation 2,000 mg  2,000 mg Bladder Instillation Once Lloyd Huger, MD       lidocaine (XYLOCAINE) 2 % jelly 1 Application  1 Application Urethral Once Lloyd Huger, MD       oxybutynin (DITROPAN) tablet 5 mg  5 mg Oral Once PRN Lloyd Huger, MD       prochlorperazine (COMPAZINE) tablet 10 mg  10 mg Oral Once Lloyd Huger, MD        OBJECTIVE: Vitals:   02/21/22 0913  BP: (!) 164/85  Pulse: 77  Resp: 17  Temp: (!) 96.9 F (36.1 C)  SpO2: 100%     Body mass index is 18.2 kg/m.    ECOG FS:0 - Asymptomatic  General: Well-developed, well-nourished, no acute distress. Eyes: Pink conjunctiva, anicteric sclera. HEENT: Normocephalic, moist mucous membranes. Lungs: No audible wheezing or coughing. Heart: Regular rate and rhythm. Abdomen: Soft, nontender, no obvious distention. Musculoskeletal: No edema, cyanosis, or clubbing. Neuro: Alert, answering all questions appropriately. Cranial nerves grossly intact. Skin: No rashes or petechiae noted. Psych: Normal affect.  LAB RESULTS:  Lab Results  Component Value Date   NA 130 (L) 02/21/2022   K 4.9 02/21/2022   CL 95 (L) 02/21/2022   CO2 27 02/21/2022   GLUCOSE 92 02/21/2022   BUN 11 02/21/2022   CREATININE 0.68 02/21/2022   CALCIUM 9.4 02/21/2022   PROT 6.7 02/21/2022   ALBUMIN 3.8 02/21/2022   AST 38 02/21/2022   ALT 27 02/21/2022   ALKPHOS 88 02/21/2022   BILITOT 0.3 02/21/2022   GFRNONAA >60 02/21/2022   GFRAA >60 06/17/2019    Lab Results  Component Value Date   WBC 3.1 (L) 02/21/2022   NEUTROABS 1.3 (L) 02/21/2022   HGB 10.7 (L) 02/21/2022   HCT 31.9 (L) 02/21/2022   MCV 97.9 02/21/2022   PLT 114 (L) 02/21/2022     STUDIES: CUP PACEART INCLINIC DEVICE CHECK  Result Date: 02/08/2022 CRT-D device check in office. Thresholds and sensing consistent with previous device  measurements. Lead impedance trends stable over time. Afib lasting 2 hours and 11 minutes noted. No ventricular arrhythmia episodes recorded. Patient bi-ventricularly pacing 95.9%  of the time. Device programmed with appropriate safety margins. Heart failure diagnostics reviewed and trends are stable for patient. No changes made this session-pt programmed at chronic settings. Estimated longevity 7.1 years.  Patient enrolled in remote follow up. Plan to check device remotely in 3 months and see in office in 6 months. Patient education completed including shock plan.  91 day check s/p gen change.  Wound site well healed.Myrtie Hawk, BSN, RN  CUP PACEART REMOTE DEVICE CHECK  Result Date: 02/08/2022 Scheduled remote reviewed. Normal device function.  1 AF episode 9/27, duration 2hrs, mean HR 107, burden 0.1%.  Hx of PAF in EPIC, no OAC on MAR Next remote 91 days. LA   ASSESSMENT: Minimally invasive bladder cancer.  PLAN:    1. Minimally invasive bladder cancer: Patient received intravesical gemcitabine approximately 2 years ago.  She now has minimally invasive disease into the lamina propria, but there was no muscularis propria on sample.  Discussion with urology confirmed that no repeat biopsy is planned until her next cystoscopy is scheduled on March 03, 2022.  Despite recommendation for Keytruda every 3 weeks for up to 2 years, patient has declined and wishes to pursue intravesical gemcitabine weekly x6.  She did agree that if she has persistent disease at her cystoscopy in December she would consider IV Keytruda at that time.  Proceed with cycle 6 of treatment today as scheduled.  No further follow-up has been scheduled at this point, will await results of cystoscopy and urology input.   I spent a total of 30 minutes reviewing chart data, face-to-face evaluation with the patient, counseling and coordination of care as detailed above.   Patient expressed understanding and was in agreement with this  plan. She also understands that She can call clinic at any time with any questions, concerns, or complaints.    Cancer Staging  Bladder cancer [facility]) Staging form: Urinary Bladder, AJCC 7th Edition - Clinical stage from 03/19/2019: Stage I (T1, N0, M0) - Signed by Lloyd Huger, MD on 12/23/2021   Lloyd Huger, MD   02/21/2022 9:58 AM

## 2022-02-21 NOTE — Patient Instructions (Signed)
[facility] CANCER CTR AT [place]  Discharge Instructions: Thank you for choosing [facility] to provide your oncology and hematology care.  If you have a lab appointment with the [facility], please go directly to the [facility] and check in at the registration area.  Wear comfortable clothing and clothing appropriate for easy access to any Portacath or PICC line.   We strive to give you quality time with your provider. You may need to reschedule your appointment if you arrive late (15 or more minutes).  Arriving late affects you and other patients whose appointments are after yours.  Also, if you miss three or more appointments without notifying the office, you may be dismissed from the clinic at the provider's discretion.      For prescription refill requests, have your pharmacy contact our office and allow 72 hours for refills to be completed.    Today you received the following chemotherapy and/or immunotherapy agents- gemzar      To help prevent nausea and vomiting after your treatment, we encourage you to take your nausea medication as directed.  BELOW ARE SYMPTOMS THAT SHOULD BE REPORTED IMMEDIATELY: *FEVER GREATER THAN 100.4 F (38 C) OR HIGHER *CHILLS OR SWEATING *NAUSEA AND VOMITING THAT IS NOT CONTROLLED WITH YOUR NAUSEA MEDICATION *UNUSUAL SHORTNESS OF BREATH *UNUSUAL BRUISING OR BLEEDING *URINARY PROBLEMS (pain or burning when urinating, or frequent urination) *BOWEL PROBLEMS (unusual diarrhea, constipation, pain near the anus) TENDERNESS IN MOUTH AND THROAT WITH OR WITHOUT PRESENCE OF ULCERS (sore throat, sores in mouth, or a toothache) UNUSUAL RASH, SWELLING OR PAIN  UNUSUAL VAGINAL DISCHARGE OR ITCHING   Items with * indicate a potential emergency and should be followed up as soon as possible or go to the Emergency Department if any problems should occur.  Please show the CHEMOTHERAPY ALERT CARD or IMMUNOTHERAPY ALERT CARD at check-in to the  Emergency Department and triage nurse.  Should you have questions after your visit or need to cancel or reschedule your appointment, please contact [facility] CANCER [place] AT [place]  [phone] and follow the prompts.  Office hours are 8:00 a.m. to 4:30 p.m. Monday - Friday. Please note that voicemails left after 4:00 p.m. may not be returned until the following business day.  We are closed weekends and major holidays. You have access to a nurse at all times for urgent questions. Please call the main number to the clinic [phone] and follow the prompts.  For any non-urgent questions, you may also contact your provider using MyChart. We now offer e-Visits for anyone 49 and older to request care online for non-urgent symptoms. For details visit [email or web address].   Also download the MyChart app! Go to the app store, search "MyChart", open the app, select [facility], and log in with your MyChart username and password.  Masks are optional in the cancer centers. If you would like for your care team to wear a mask while they are taking care of you, please let them know. For doctor visits, patients may have with them one support person who is at least 82 years old. At this time, visitors are not allowed in the infusion area.

## 2022-02-21 NOTE — Progress Notes (Signed)
Patient completed 45 minutes of 1 hour dwell time and asked for catheter to be removed. Foley drained and catheter removed per policy.

## 2022-02-21 NOTE — Telephone Encounter (Signed)
-----   Message from Emily Filbert, RN sent at 02/15/2022  4:06 PM EST ----- Only saw [name] on 11/21 due to SK sickness  She needs to see SK in 6 months please and thank you!

## 2022-02-21 NOTE — Progress Notes (Signed)
Patient missed 1 of her Tx's d/t UTI. No complaints offered today.

## 2022-02-21 NOTE — Telephone Encounter (Signed)
LVM TO SCHEDULE 6 MONTHS F/U

## 2022-02-22 ENCOUNTER — Telehealth: Payer: Self-pay | Admitting: Internal Medicine

## 2022-02-22 NOTE — Telephone Encounter (Signed)
Pt had a Cmet drawn yesterday by a different provider. Does she need to have another drawn later?

## 2022-02-22 NOTE — Telephone Encounter (Signed)
Patient called and said she spoke to [place] last week. [name] told her to come in to do labs to check her sodium. No orders in chart.

## 2022-02-23 NOTE — Telephone Encounter (Signed)
Pt stated that she is not having dizziness any more.

## 2022-02-24 NOTE — Telephone Encounter (Signed)
Pt is aware and gave a verbal understanding.  

## 2022-03-03 DIAGNOSIS — R300 Dysuria: Secondary | ICD-10-CM | POA: Diagnosis not present

## 2022-03-03 DIAGNOSIS — C678 Malignant neoplasm of overlapping sites of bladder: Secondary | ICD-10-CM | POA: Diagnosis not present

## 2022-03-08 DIAGNOSIS — M419 Scoliosis, unspecified: Secondary | ICD-10-CM | POA: Diagnosis not present

## 2022-03-08 DIAGNOSIS — C678 Malignant neoplasm of overlapping sites of bladder: Secondary | ICD-10-CM | POA: Diagnosis not present

## 2022-03-08 DIAGNOSIS — M5115 Intervertebral disc disorders with radiculopathy, thoracolumbar region: Secondary | ICD-10-CM | POA: Diagnosis not present

## 2022-03-08 DIAGNOSIS — M159 Polyosteoarthritis, unspecified: Secondary | ICD-10-CM | POA: Diagnosis not present

## 2022-03-08 DIAGNOSIS — G8929 Other chronic pain: Secondary | ICD-10-CM | POA: Diagnosis not present

## 2022-03-08 DIAGNOSIS — I110 Hypertensive heart disease with heart failure: Secondary | ICD-10-CM | POA: Diagnosis not present

## 2022-03-09 DIAGNOSIS — G8929 Other chronic pain: Secondary | ICD-10-CM | POA: Diagnosis not present

## 2022-03-09 DIAGNOSIS — M419 Scoliosis, unspecified: Secondary | ICD-10-CM | POA: Diagnosis not present

## 2022-03-09 DIAGNOSIS — C678 Malignant neoplasm of overlapping sites of bladder: Secondary | ICD-10-CM | POA: Diagnosis not present

## 2022-03-09 DIAGNOSIS — I110 Hypertensive heart disease with heart failure: Secondary | ICD-10-CM | POA: Diagnosis not present

## 2022-03-09 DIAGNOSIS — M159 Polyosteoarthritis, unspecified: Secondary | ICD-10-CM | POA: Diagnosis not present

## 2022-03-09 DIAGNOSIS — M5115 Intervertebral disc disorders with radiculopathy, thoracolumbar region: Secondary | ICD-10-CM | POA: Diagnosis not present

## 2022-03-10 ENCOUNTER — Encounter: Payer: Self-pay | Admitting: Oncology

## 2022-03-16 DIAGNOSIS — C678 Malignant neoplasm of overlapping sites of bladder: Secondary | ICD-10-CM | POA: Diagnosis not present

## 2022-03-16 DIAGNOSIS — M159 Polyosteoarthritis, unspecified: Secondary | ICD-10-CM | POA: Diagnosis not present

## 2022-03-16 DIAGNOSIS — M419 Scoliosis, unspecified: Secondary | ICD-10-CM | POA: Diagnosis not present

## 2022-03-16 DIAGNOSIS — I110 Hypertensive heart disease with heart failure: Secondary | ICD-10-CM | POA: Diagnosis not present

## 2022-03-16 DIAGNOSIS — M5115 Intervertebral disc disorders with radiculopathy, thoracolumbar region: Secondary | ICD-10-CM | POA: Diagnosis not present

## 2022-03-16 DIAGNOSIS — G8929 Other chronic pain: Secondary | ICD-10-CM | POA: Diagnosis not present

## 2022-03-18 ENCOUNTER — Other Ambulatory Visit: Payer: Self-pay | Admitting: Student in an Organized Health Care Education/Training Program

## 2022-03-18 DIAGNOSIS — M47816 Spondylosis without myelopathy or radiculopathy, lumbar region: Secondary | ICD-10-CM

## 2022-03-18 DIAGNOSIS — G894 Chronic pain syndrome: Secondary | ICD-10-CM

## 2022-03-18 DIAGNOSIS — M5416 Radiculopathy, lumbar region: Secondary | ICD-10-CM

## 2022-03-20 DIAGNOSIS — M419 Scoliosis, unspecified: Secondary | ICD-10-CM | POA: Diagnosis not present

## 2022-03-20 DIAGNOSIS — Z79891 Long term (current) use of opiate analgesic: Secondary | ICD-10-CM | POA: Diagnosis not present

## 2022-03-20 DIAGNOSIS — M5115 Intervertebral disc disorders with radiculopathy, thoracolumbar region: Secondary | ICD-10-CM | POA: Diagnosis not present

## 2022-03-20 DIAGNOSIS — I5022 Chronic systolic (congestive) heart failure: Secondary | ICD-10-CM | POA: Diagnosis not present

## 2022-03-20 DIAGNOSIS — Z9682 Presence of neurostimulator: Secondary | ICD-10-CM | POA: Diagnosis not present

## 2022-03-20 DIAGNOSIS — R350 Frequency of micturition: Secondary | ICD-10-CM | POA: Diagnosis not present

## 2022-03-20 DIAGNOSIS — G8929 Other chronic pain: Secondary | ICD-10-CM | POA: Diagnosis not present

## 2022-03-20 DIAGNOSIS — E871 Hypo-osmolality and hyponatremia: Secondary | ICD-10-CM | POA: Diagnosis not present

## 2022-03-20 DIAGNOSIS — I110 Hypertensive heart disease with heart failure: Secondary | ICD-10-CM | POA: Diagnosis not present

## 2022-03-20 DIAGNOSIS — I2510 Atherosclerotic heart disease of native coronary artery without angina pectoris: Secondary | ICD-10-CM | POA: Diagnosis not present

## 2022-03-20 DIAGNOSIS — F17210 Nicotine dependence, cigarettes, uncomplicated: Secondary | ICD-10-CM | POA: Diagnosis not present

## 2022-03-20 DIAGNOSIS — K219 Gastro-esophageal reflux disease without esophagitis: Secondary | ICD-10-CM | POA: Diagnosis not present

## 2022-03-20 DIAGNOSIS — I428 Other cardiomyopathies: Secondary | ICD-10-CM | POA: Diagnosis not present

## 2022-03-20 DIAGNOSIS — C678 Malignant neoplasm of overlapping sites of bladder: Secondary | ICD-10-CM | POA: Diagnosis not present

## 2022-03-20 DIAGNOSIS — Z9181 History of falling: Secondary | ICD-10-CM | POA: Diagnosis not present

## 2022-03-20 DIAGNOSIS — I447 Left bundle-branch block, unspecified: Secondary | ICD-10-CM | POA: Diagnosis not present

## 2022-03-20 DIAGNOSIS — J439 Emphysema, unspecified: Secondary | ICD-10-CM | POA: Diagnosis not present

## 2022-03-20 DIAGNOSIS — M159 Polyosteoarthritis, unspecified: Secondary | ICD-10-CM | POA: Diagnosis not present

## 2022-03-20 DIAGNOSIS — R351 Nocturia: Secondary | ICD-10-CM | POA: Diagnosis not present

## 2022-03-20 DIAGNOSIS — R296 Repeated falls: Secondary | ICD-10-CM | POA: Diagnosis not present

## 2022-03-20 DIAGNOSIS — J449 Chronic obstructive pulmonary disease, unspecified: Secondary | ICD-10-CM | POA: Diagnosis not present

## 2022-03-23 DIAGNOSIS — M419 Scoliosis, unspecified: Secondary | ICD-10-CM | POA: Diagnosis not present

## 2022-03-23 DIAGNOSIS — M159 Polyosteoarthritis, unspecified: Secondary | ICD-10-CM | POA: Diagnosis not present

## 2022-03-23 DIAGNOSIS — C678 Malignant neoplasm of overlapping sites of bladder: Secondary | ICD-10-CM | POA: Diagnosis not present

## 2022-03-23 DIAGNOSIS — I110 Hypertensive heart disease with heart failure: Secondary | ICD-10-CM | POA: Diagnosis not present

## 2022-03-23 DIAGNOSIS — G8929 Other chronic pain: Secondary | ICD-10-CM | POA: Diagnosis not present

## 2022-03-23 DIAGNOSIS — M5115 Intervertebral disc disorders with radiculopathy, thoracolumbar region: Secondary | ICD-10-CM | POA: Diagnosis not present

## 2022-03-23 NOTE — Progress Notes (Signed)
Remote ICD transmission.   

## 2022-03-25 ENCOUNTER — Encounter: Payer: Self-pay | Admitting: Oncology

## 2022-03-29 DIAGNOSIS — G8929 Other chronic pain: Secondary | ICD-10-CM | POA: Diagnosis not present

## 2022-03-29 DIAGNOSIS — M159 Polyosteoarthritis, unspecified: Secondary | ICD-10-CM | POA: Diagnosis not present

## 2022-03-29 DIAGNOSIS — M5115 Intervertebral disc disorders with radiculopathy, thoracolumbar region: Secondary | ICD-10-CM | POA: Diagnosis not present

## 2022-03-29 DIAGNOSIS — C678 Malignant neoplasm of overlapping sites of bladder: Secondary | ICD-10-CM | POA: Diagnosis not present

## 2022-03-29 DIAGNOSIS — I110 Hypertensive heart disease with heart failure: Secondary | ICD-10-CM | POA: Diagnosis not present

## 2022-03-29 DIAGNOSIS — M419 Scoliosis, unspecified: Secondary | ICD-10-CM | POA: Diagnosis not present

## 2022-04-01 DIAGNOSIS — I110 Hypertensive heart disease with heart failure: Secondary | ICD-10-CM | POA: Diagnosis not present

## 2022-04-01 DIAGNOSIS — M159 Polyosteoarthritis, unspecified: Secondary | ICD-10-CM | POA: Diagnosis not present

## 2022-04-01 DIAGNOSIS — C678 Malignant neoplasm of overlapping sites of bladder: Secondary | ICD-10-CM | POA: Diagnosis not present

## 2022-04-01 DIAGNOSIS — G8929 Other chronic pain: Secondary | ICD-10-CM | POA: Diagnosis not present

## 2022-04-01 DIAGNOSIS — M419 Scoliosis, unspecified: Secondary | ICD-10-CM | POA: Diagnosis not present

## 2022-04-01 DIAGNOSIS — M5115 Intervertebral disc disorders with radiculopathy, thoracolumbar region: Secondary | ICD-10-CM | POA: Diagnosis not present

## 2022-04-06 DIAGNOSIS — M5115 Intervertebral disc disorders with radiculopathy, thoracolumbar region: Secondary | ICD-10-CM | POA: Diagnosis not present

## 2022-04-06 DIAGNOSIS — C678 Malignant neoplasm of overlapping sites of bladder: Secondary | ICD-10-CM | POA: Diagnosis not present

## 2022-04-06 DIAGNOSIS — M419 Scoliosis, unspecified: Secondary | ICD-10-CM | POA: Diagnosis not present

## 2022-04-06 DIAGNOSIS — I110 Hypertensive heart disease with heart failure: Secondary | ICD-10-CM | POA: Diagnosis not present

## 2022-04-06 DIAGNOSIS — M159 Polyosteoarthritis, unspecified: Secondary | ICD-10-CM | POA: Diagnosis not present

## 2022-04-06 DIAGNOSIS — G8929 Other chronic pain: Secondary | ICD-10-CM | POA: Diagnosis not present

## 2022-04-14 ENCOUNTER — Encounter: Payer: Self-pay | Admitting: Student in an Organized Health Care Education/Training Program

## 2022-04-14 ENCOUNTER — Ambulatory Visit
Payer: Medicare Other | Attending: Student in an Organized Health Care Education/Training Program | Admitting: Student in an Organized Health Care Education/Training Program

## 2022-04-14 ENCOUNTER — Other Ambulatory Visit: Payer: Medicare Other

## 2022-04-14 VITALS — BP 140/86 | HR 87 | Temp 97.5°F | Ht 68.75 in | Wt 115.0 lb

## 2022-04-14 DIAGNOSIS — M48062 Spinal stenosis, lumbar region with neurogenic claudication: Secondary | ICD-10-CM | POA: Diagnosis not present

## 2022-04-14 DIAGNOSIS — G894 Chronic pain syndrome: Secondary | ICD-10-CM

## 2022-04-14 DIAGNOSIS — M47818 Spondylosis without myelopathy or radiculopathy, sacral and sacrococcygeal region: Secondary | ICD-10-CM | POA: Diagnosis not present

## 2022-04-14 DIAGNOSIS — M47816 Spondylosis without myelopathy or radiculopathy, lumbar region: Secondary | ICD-10-CM | POA: Insufficient documentation

## 2022-04-14 DIAGNOSIS — Z515 Encounter for palliative care: Secondary | ICD-10-CM

## 2022-04-14 DIAGNOSIS — M5416 Radiculopathy, lumbar region: Secondary | ICD-10-CM

## 2022-04-14 MED ORDER — OXYCODONE-ACETAMINOPHEN 10-325 MG PO TABS: 1.0000 | ORAL_TABLET | ORAL | 0 refills | Status: DC | PRN

## 2022-04-14 NOTE — Progress Notes (Signed)
[facility]IVE [facility]ARE SW NOTE  PATIENT NAME: CITLALY CAMPLIN DOB: 04/12/1940 MRN: [id number]                                  TELEPHONIC CHECK IN/VISIT  PC SW connected with patient via telephone to complete telephonic check in and schedule in person visit.  Back pain - patient shares that she continues to suffer from back pain and has pain medicine to assist in managing the symptoms but it limiting her mobility and ability to do things.  HH PT - [facility] is providing PT, but does not feel she can continue to participate due to back pain. SW encouraged patient to share this with the PT when they do their visit this week, patient in agreement.  Appetite - patient share is eating fine and has caregiver that assist with meals. Has no other concerns other the back pain.  PC will continue to follow. PC to Call next month to schedule in person visit, per patient request.        SOCIAL HX:  Social History   Tobacco Use   Smoking status: Every Day    Packs/day: 0.50    Years: 56.00    Total pack years: 28.00    Types: Cigarettes   Smokeless tobacco: Never   Tobacco comments:    02-11-2019  per pt currently smokes 10 cig's per day , started smoking age 1  Substance Use Topics   Alcohol use: No    ADVANCED DIRECTIVES: Y   Time spent: 20 min       [place] Henrene Pastor, [place]

## 2022-04-14 NOTE — Progress Notes (Signed)
Safety precautions to be maintained throughout the outpatient stay will include: orient to surroundings, keep bed in low position, maintain call bell within reach at all times, provide assistance with transfer out of bed and ambulation.   Nursing Pain Medication Assessment:  Safety precautions to be maintained throughout the outpatient stay will include: orient to surroundings, keep bed in low position, maintain call bell within reach at all times, provide assistance with transfer out of bed and ambulation.  Medication Inspection Compliance: Pill count conducted under aseptic conditions, in front of the patient. Neither the pills nor the bottle was removed from the patient's sight at any time. Once count was completed pills were immediately returned to the patient in their original bottle.  Medication: Oxycodone/APAP Pill/Patch Count:  59 of 150 pills remain Pill/Patch Appearance: Markings consistent with prescribed medication Bottle Appearance: Standard pharmacy container. Clearly labeled. Filled Date: 01 / 06 / 2024 Last Medication intake:  Today

## 2022-04-14 NOTE — Progress Notes (Signed)
PROVIDER NOTE: Information contained herein reflects review and annotations entered in association with encounter. Interpretation of such information and data should be left to medically-trained personnel. Information provided to patient can be located elsewhere in the medical record under "Patient Instructions". Document created using STT-dictation technology, any transcriptional errors that may result from process are unintentional.    Patient: [name]  Service Category: E/M  Provider: [name], MD  DOB: [birth year]  DOS: 04/14/2022  Specialty: Interventional Pain Management  MRN: [id number]  Setting: Ambulatory outpatient  PCP: [name], MD  Type: Established Patient    Referring Provider: [name], MD  Location: Office  Delivery: Face-to-face     HPI  [name], a 82 y.o. year old female, is here today because of her Lumbar radiculopathy [M54.16]. [name] primary complain today is Back Pain Last encounter: My last encounter with her was on 02/15/22 Pertinent problems: [name] has Spinal stenosis of thoracolumbar region; Spinal stenosis in cervical region; Spinal stenosis, lumbar region, with neurogenic claudication; Lumbar spondylosis; SI joint arthritis; Lumbar degenerative disc disease; Chronic pain syndrome; Lumbar radiculopathy; Lumbar facet arthropathy; and Back pain on their pertinent problem list. Pain Assessment: Severity of Chronic pain is reported as a 8 /10. Location: Back Lower, Right, Left/Radaites to buttocks bilateral. Onset: More than a month ago. Quality: Constant, Aching. Timing: Constant. Modifying factor(s): Pain medication and laying down. Vitals:  height is 5' 8.75" (1.746 m) and weight is 115 lb (52.2 kg). Her temporal temperature is 97.5 F (36.4 C) (abnormal). Her blood pressure is 140/86 (abnormal) and her pulse is 87. Her oxygen saturation is 99%.   Reason for encounter: medication management.  No change in medical history since last  visit.  Patient's pain is at baseline.  Patient continues multimodal pain regimen as prescribed.  States that it provides pain relief and improvement in functional status.   Pharmacotherapy Assessment  Analgesic: Percocet 10 mg up to 5 times a day as needed for severe breakthrough pain, quantity 150/month; MME equals 75   Monitoring: [place] PMP: PDMP reviewed during this encounter.       Pharmacotherapy: No side-effects or adverse reactions reported. Compliance: No problems identified. Effectiveness: Clinically acceptable.  Al Decant, RN  04/14/2022  1:22 PM  Sign when Signing Visit Safety precautions to be maintained throughout the outpatient stay will include: orient to surroundings, keep bed in low position, maintain call bell within reach at all times, provide assistance with transfer out of bed and ambulation.   Nursing Pain Medication Assessment:  Safety precautions to be maintained throughout the outpatient stay will include: orient to surroundings, keep bed in low position, maintain call bell within reach at all times, provide assistance with transfer out of bed and ambulation.  Medication Inspection Compliance: Pill count conducted under aseptic conditions, in front of the patient. Neither the pills nor the bottle was removed from the patient's sight at any time. Once count was completed pills were immediately returned to the patient in their original bottle.  Medication: Oxycodone/APAP Pill/Patch Count:  59 of 150 pills remain Pill/Patch Appearance: Markings consistent with prescribed medication Bottle Appearance: Standard pharmacy container. Clearly labeled. Filled Date: 01 / 06 / 2024 Last Medication intake:  Today     UDS:  Summary  Date Value Ref Range Status  12/22/2021 Note  Final    Comment:    ==================================================================== [employer] Select 13 (MW) ==================================================================== Specimen  Alert Note: Urinary creatinine is low; ability to detect some  drugs may be compromised. Interpret results with caution. (Creatinine) ==================================================================== Test                             Result       Flag       Units  Drug Present and Declared for Prescription Verification   Oxycodone                      11927        EXPECTED   ng/mg creat   Oxymorphone                    13545        EXPECTED   ng/mg creat   Noroxycodone                   15409        EXPECTED   ng/mg creat   Noroxymorphone                 3791         EXPECTED   ng/mg creat    Sources of oxycodone are scheduled prescription medications.    Oxymorphone, noroxycodone, and noroxymorphone are expected    metabolites of oxycodone. Oxymorphone is also available as a    scheduled prescription medication.  ==================================================================== Test                      Result    Flag   Units      Ref Range   Creatinine              11        LL     mg/dL      >=20 ==================================================================== Declared Medications:  The flagging and interpretation on this report are based on the  following declared medications.  Unexpected results may arise from  inaccuracies in the declared medications.   **Note: The testing scope of this panel includes these medications:   Oxycodone (Percocet)   **Note: The testing scope of this panel does not include the  following reported medications:   Acetaminophen (Tylenol)  Acetaminophen (Percocet)  Amitriptyline (Elavil)  Amlodipine (Norvasc)  Gabapentin (Neurontin)  Losartan (Cozaar)  Menthol  Metoprolol (Toprol)  Pantoprazole (Protonix)  Polyethylene Glycol (MiraLAX)  Promethazine (Phenergan)  Sucralfate (Carafate) ==================================================================== For clinical consultation, please call [phone]. ====================================================================      ROS  Constitutional: Denies any fever or chills Gastrointestinal: No reported hemesis, hematochezia, vomiting, or acute GI distress Musculoskeletal:  Low back, leg pain Neurological: No reported episodes of acute onset apraxia, aphasia, dysarthria, agnosia, amnesia, paralysis, loss of coordination, or loss of consciousness  Medication Review  [employer] Daily Pro, Menthol (Topical Analgesic), acetaminophen, amLODipine, amitriptyline, gabapentin, losartan, metoprolol succinate, oxyCODONE-acetaminophen, pantoprazole, polyethylene glycol, prochlorperazine, promethazine, and sucralfate  History Review  Allergy: [name] has No Known Allergies. Drug: [name]  reports no history of drug use. Alcohol:  reports no history of alcohol use. Tobacco:  reports that she has been smoking cigarettes. She has a 28.00 pack-year smoking history. She has never used smokeless tobacco. Social: [name]  reports that she has been smoking cigarettes. She has a 28.00 pack-year smoking history. She has never used smokeless tobacco. She reports that she does not drink alcohol and does not use drugs. Medical:  has a past medical history of AICD (automatic cardioverter/defibrillator) present ([place]  cardiologist--- dr Caryl Comes ), Arthritis, Bladder cancer [facility]) (urologist-  dr Junious Silk), Chronic hyponatremia, Chronic low back pain with bilateral sciatica, Chronic systolic (congestive) heart failure ([place]), COPD with emphysema ([place]), Coronary artery disease (cardiologist-  dr Kathlyn [place]), DDD (degenerative disc disease), thoracolumbar, Frequent urination, Full dentures, Gait instability, GERD (gastroesophageal reflux disease), History of iron deficiency anemia (11/2013), History of stomach ulcers (11/2013), Hypertension, LBBB (left bundle branch block), NICM (nonischemic cardiomyopathy) ([place]) (last echo 12-31-2015 ef 55-60%), Nocturia  more than twice per night, S/P insertion of spinal cord stimulator (followed by [facility]---- Dr Pecola Leisure (notes in care everywhere)), Scoliosis, Uses walker (02/18/2021), and Wears glasses. Surgical: [name]  has a past surgical history that includes Esophagogastroduodenoscopy (N/A, 11/28/2013); Transurethral resection of bladder tumor (N/A, 12/13/2013); Transurethral resection of bladder tumor (N/A, 01/17/2014); Cataract extraction w/ intraocular lens  implant, bilateral (Bilateral, right 12-2013 / left  02-2014); Cystoscopy with retrograde pyelogram, ureteroscopy and stent placement (Bilateral, 04/18/2014); Transurethral resection of bladder tumor (N/A, 04/18/2014); Lumbar disc surgery (1980's); bi-ventricular implantable cardioverter defibrillator (N/A, 05/07/2014); Cystoscopy w/ retrogrades (Bilateral, 01/06/2015); Cystoscopy with biopsy (N/A, 10/06/2015); Cardiac catheterization (03-03-2014  dr Kathlyn [place]   [facility]); Transurethral resection of bladder (1995); Cardiovascular stress test (11/25/2013); transthoracic echocardiogram (12/31/2015   dr Caryl Comes); Cystoscopy with fulgeration (N/A, 06/30/2017); Spinal cord stimulator implant (10-09-2018   '@[facility]'$ ); Cystoscopy with biopsy (N/A, 02/12/2019); Transurethral resection of bladder tumor (N/A, 12/13/2019); Transurethral resection of bladder tumor (N/A, 10/06/2020); colonscopy (11/2020); Cystoscopy with biopsy (N/A, 03/19/2021); ICD GENERATOR CHANGEOUT (N/A, 11/04/2021); Biv pacemaker generator change out; and Transurethral resection of bladder tumor (N/A, 11/30/2021). Family: family history includes Cancer in her brother and mother.  Laboratory Chemistry Profile   Renal Lab Results  Component Value Date   BUN 11 02/21/2022   CREATININE 0.68 02/21/2022   BCR 18 04/28/2014   GFR 81.41 02/16/2022   GFRAA >60 06/17/2019   GFRNONAA >60 02/21/2022    Hepatic Lab Results  Component Value Date   AST 38 02/21/2022   ALT 27 02/21/2022   ALBUMIN 3.8  02/21/2022   ALKPHOS 88 02/21/2022   LIPASE 30 11/23/2013    Electrolytes Lab Results  Component Value Date   NA 130 (L) 02/21/2022   K 4.9 02/21/2022   CL 95 (L) 02/21/2022   CALCIUM 9.4 02/21/2022   MG 2.3 06/03/2019    Bone Lab Results  Component Value Date   VD25OH 36.17 01/11/2019    Inflammation (CRP: Acute Phase) (ESR: Chronic Phase) No results found for: "CRP", "ESRSEDRATE", "LATICACIDVEN"       Note: Above Lab results reviewed.  Recent Imaging Review  CUP PACEART INCLINIC DEVICE CHECK CRT-D device check in office. Thresholds and sensing consistent with previous device measurements. Lead impedance trends stable over time. Afib lasting 2 hours and 11 minutes noted. No ventricular arrhythmia episodes recorded. Patient bi-ventricularly  pacing 95.9% of the time. Device programmed with appropriate safety margins. Heart failure diagnostics reviewed and trends are stable for patient. No changes made this session-pt programmed at chronic settings. Estimated longevity 7.1 years.  Patient  enrolled in remote follow up. Plan to check device remotely in 3 months and see in office in 6 months. Patient education completed including shock plan.  91 day check s/p gen change.  Wound site well healed.[name], BSN, RN CUP PACEART REMOTE DEVICE CHECK Scheduled remote reviewed. Normal device function.   1 AF episode 9/27, duration 2hrs, mean HR 107, burden 0.1%.  Hx of PAF in EPIC, no OAC on  MAR Next remote 91 days. LA Note: Reviewed        Physical Exam  General appearance: alert, cooperative, demanding, and in moderate distress Mental status: Alert, oriented x 3 (person, place, & time)       Respiratory: No evidence of acute respiratory distress Eyes: PERLA Vitals: BP (!) 140/86   Pulse 87   Temp (!) 97.5 F (36.4 C) (Temporal)   Ht 5' 8.75" (1.746 m)   Wt 115 lb (52.2 kg)   SpO2 99%   BMI 17.11 kg/m  BMI: Estimated body mass index is 17.11 kg/m as calculated from the  following:   Height as of this encounter: 5' 8.75" (1.746 m).   Weight as of this encounter: 115 lb (52.2 kg). Ideal: Ideal body weight: 65.6 kg (144 lb 10.8 oz)    Lumbar Exam  Skin & Axial Inspection: Well healed scar from previous spine surgery detected, IPG for SCS in place Alignment: Symmetrical Functional ROM: Pain restricted ROM       Stability: No instability detected Muscle Tone/Strength: Functionally intact. No obvious neuro-muscular anomalies detected. Sensory (Neurological): Dermatomal pain pattern Palpation: No palpable anomalies           Gait & Posture Assessment  Ambulation: Patient ambulates using a walker Gait: Limited. Using assistive device to ambulate Posture: Difficulty standing up straight, due to pain    Lower Extremity Exam      Side: Right lower extremity   Side: Left lower extremity  Stability: No instability observed           Stability: No instability observed          Skin & Extremity Inspection: Skin color, temperature, and hair growth are WNL. No peripheral edema or cyanosis. No masses, redness, swelling, asymmetry, or associated skin lesions. No contractures.   Skin & Extremity Inspection: Skin color, temperature, and hair growth are WNL. No peripheral edema or cyanosis. No masses, redness, swelling, asymmetry, or associated skin lesions. No contractures.  Functional ROM: Pain restricted ROM for all joints of the lower extremity           Functional ROM: Pain restricted ROM for all joints of the lower extremity          Muscle Tone/Strength: Functionally intact. No obvious neuro-muscular anomalies detected.   Muscle Tone/Strength: Functionally intact. No obvious neuro-muscular anomalies detected.  Sensory (Neurological): Dermatomal pain pattern         Sensory (Neurological): Dermatomal pain pattern        DTR: Patellar: deferred today Achilles: deferred today Plantar: deferred today   DTR: Patellar: deferred today Achilles: deferred today Plantar:  deferred today  Palpation: No palpable anomalies   Palpation: No palpable anomalies        Assessment   Diagnosis  1. Lumbar radiculopathy   2. Lumbar facet arthropathy   3. Spinal stenosis, lumbar region, with neurogenic claudication   4. Lumbar spondylosis   5. SI joint arthritis   6. Chronic pain syndrome      Plan of Care   Ms. MIKKA Anderson has a current medication list which includes the following long-term medication(s): amitriptyline, amlodipine, gabapentin, losartan, metoprolol succinate, prochlorperazine, promethazine, and sucralfate.   Pharmacotherapy (Medications Ordered): Meds ordered this encounter  Medications   oxyCODONE-acetaminophen (PERCOCET) 10-325 MG tablet    Sig: Take 1 tablet by mouth every 4 (four) hours as needed for pain.    Dispense:  150 tablet    Refill:  0   oxyCODONE-acetaminophen (PERCOCET) 10-325  MG tablet    Sig: Take 1 tablet by mouth every 4 (four) hours as needed for pain.    Dispense:  150 tablet    Refill:  0   oxyCODONE-acetaminophen (PERCOCET) 10-325 MG tablet    Sig: Take 1 tablet by mouth every 4 (four) hours as needed for pain.    Dispense:  150 tablet    Refill:  0    No orders of the defined types were placed in this encounter.  Continue with spinal cord stimulation.  Follow-up plan:   Return in about 3 months (around 07/14/2022) for Medication Management, in person.    Recent Visits Date Type Provider Dept  02/15/22 Office Visit [name], MD [facility]  Showing recent visits within past 90 days and meeting all other requirements Today's Visits Date Type Provider Dept  04/14/22 Office Visit [name], MD [facility]  Showing today's visits and meeting all other requirements Future Appointments Date Type Provider Dept  07/07/22 Appointment [name], MD [facility]  Showing future appointments within next 90 days and meeting all other requirements  I discussed the  assessment and treatment plan with the patient. The patient was provided an opportunity to ask questions and all were answered. The patient agreed with the plan and demonstrated an understanding of the instructions.  Patient advised to call back or seek an in-person evaluation if the symptoms or condition worsens.  Duration of encounter: 75mnutes.  Note by: BGillis Santa MD Date: 04/14/2022; Time: 1:44 PM

## 2022-04-15 DIAGNOSIS — I110 Hypertensive heart disease with heart failure: Secondary | ICD-10-CM | POA: Diagnosis not present

## 2022-04-15 DIAGNOSIS — M159 Polyosteoarthritis, unspecified: Secondary | ICD-10-CM | POA: Diagnosis not present

## 2022-04-15 DIAGNOSIS — C678 Malignant neoplasm of overlapping sites of bladder: Secondary | ICD-10-CM | POA: Diagnosis not present

## 2022-04-15 DIAGNOSIS — G8929 Other chronic pain: Secondary | ICD-10-CM | POA: Diagnosis not present

## 2022-04-15 DIAGNOSIS — M419 Scoliosis, unspecified: Secondary | ICD-10-CM | POA: Diagnosis not present

## 2022-04-15 DIAGNOSIS — M5115 Intervertebral disc disorders with radiculopathy, thoracolumbar region: Secondary | ICD-10-CM | POA: Diagnosis not present

## 2022-04-25 ENCOUNTER — Other Ambulatory Visit: Payer: Self-pay | Admitting: *Deleted

## 2022-04-25 ENCOUNTER — Telehealth: Payer: Self-pay | Admitting: Student in an Organized Health Care Education/Training Program

## 2022-04-25 DIAGNOSIS — G894 Chronic pain syndrome: Secondary | ICD-10-CM

## 2022-04-25 DIAGNOSIS — M47816 Spondylosis without myelopathy or radiculopathy, lumbar region: Secondary | ICD-10-CM

## 2022-04-25 DIAGNOSIS — M5416 Radiculopathy, lumbar region: Secondary | ICD-10-CM

## 2022-04-25 MED ORDER — OXYCODONE-ACETAMINOPHEN 10-325 MG PO TABS: 1.0000 | ORAL_TABLET | ORAL | 0 refills | Status: AC | PRN

## 2022-04-25 NOTE — Telephone Encounter (Signed)
Med refill request sent to [facility]

## 2022-04-25 NOTE — Telephone Encounter (Signed)
PT stated that current pharmacy is out of oxycodone, PT will like for [facility] [address] in [place]. Please give patient a call. TY

## 2022-04-26 ENCOUNTER — Telehealth: Payer: Self-pay

## 2022-04-26 MED ORDER — PANTOPRAZOLE SODIUM 40 MG PO TBEC: 40.0000 mg | DELAYED_RELEASE_TABLET | Freq: Every day | ORAL | 2 refills | Status: DC

## 2022-04-26 NOTE — Telephone Encounter (Signed)
[facility] sent to [facility] ;  please cancel the one set to [facility]

## 2022-04-26 NOTE — Telephone Encounter (Signed)
Prescription Request  04/26/2022  Is this a "Controlled Substance" medicine? No  LOV: Visit date not found  What is the name of the medication or equipment? pantoprazole (PROTONIX) 40 MG tablet  Have you contacted your pharmacy to request a refill? Yes   Which pharmacy would you like this sent to?   [facility] [place], [address] [place] [address] Phone: [phone] Fax: [phone]    Patient notified that their request is being sent to the clinical staff for review and that they should receive a response within 2 business days.   Please advise at [place]   Patient states her gastroenterologist originally prescribed this medication for her, but their office told her that she would need to be seen in order to refill it.  Patient states their office is in [place] and she has trouble traveling because of her back pain and her balance.  Patient states she would like to know if Dr. Deborra Medina would be willing to refill it for her.  Patient states she is out of the medication.  Patient states she would like to have a call back.

## 2022-04-26 NOTE — Addendum Note (Signed)
Addended by: [name] on: 04/26/2022 07:48 PM   Modules accepted: Orders

## 2022-04-27 ENCOUNTER — Other Ambulatory Visit: Payer: Self-pay | Admitting: Internal Medicine

## 2022-04-27 NOTE — Telephone Encounter (Signed)
Rx cancelled at [facility]

## 2022-05-09 ENCOUNTER — Ambulatory Visit (INDEPENDENT_AMBULATORY_CARE_PROVIDER_SITE_OTHER): Payer: Medicare Other

## 2022-05-09 DIAGNOSIS — I428 Other cardiomyopathies: Secondary | ICD-10-CM

## 2022-05-09 LAB — CUP PACEART REMOTE DEVICE CHECK
Battery Remaining Longevity: 87 mo
Battery Voltage: 3 V
Brady Statistic AP VP Percent: 18.11 %
Brady Statistic AP VS Percent: 0.31 %
Brady Statistic AS VP Percent: 79.64 %
Brady Statistic AS VS Percent: 1.93 %
Brady Statistic RA Percent Paced: 18.21 %
Brady Statistic RV Percent Paced: 96.54 %
Date Time Interrogation Session: 20240219073723
HighPow Impedance: 69 Ohm
Implantable Lead Connection Status: 753985
Implantable Lead Connection Status: 753985
Implantable Lead Connection Status: 753985
Implantable Lead Implant Date: 20160217
Implantable Lead Implant Date: 20160217
Implantable Lead Implant Date: 20160217
Implantable Lead Location: 753858
Implantable Lead Location: 753859
Implantable Lead Location: 753860
Implantable Lead Model: 4298
Implantable Lead Model: 5076
Implantable Pulse Generator Implant Date: 20230817
Lead Channel Impedance Value: 212.8 Ohm
Lead Channel Impedance Value: 220.723
Lead Channel Impedance Value: 234.706
Lead Channel Impedance Value: 253.333
Lead Channel Impedance Value: 264.643
Lead Channel Impedance Value: 342 Ohm
Lead Channel Impedance Value: 380 Ohm
Lead Channel Impedance Value: 399 Ohm
Lead Channel Impedance Value: 456 Ohm
Lead Channel Impedance Value: 494 Ohm
Lead Channel Impedance Value: 532 Ohm
Lead Channel Impedance Value: 570 Ohm
Lead Channel Impedance Value: 589 Ohm
Lead Channel Impedance Value: 760 Ohm
Lead Channel Impedance Value: 760 Ohm
Lead Channel Impedance Value: 893 Ohm
Lead Channel Impedance Value: 950 Ohm
Lead Channel Impedance Value: 950 Ohm
Lead Channel Pacing Threshold Amplitude: 0.5 V
Lead Channel Pacing Threshold Amplitude: 0.75 V
Lead Channel Pacing Threshold Amplitude: 1.25 V
Lead Channel Pacing Threshold Pulse Width: 0.4 ms
Lead Channel Pacing Threshold Pulse Width: 0.4 ms
Lead Channel Pacing Threshold Pulse Width: 0.4 ms
Lead Channel Sensing Intrinsic Amplitude: 2.375 mV
Lead Channel Sensing Intrinsic Amplitude: 2.375 mV
Lead Channel Sensing Intrinsic Amplitude: 23 mV
Lead Channel Sensing Intrinsic Amplitude: 23 mV
Lead Channel Setting Pacing Amplitude: 1.5 V
Lead Channel Setting Pacing Amplitude: 1.75 V
Lead Channel Setting Pacing Amplitude: 2 V
Lead Channel Setting Pacing Pulse Width: 0.4 ms
Lead Channel Setting Pacing Pulse Width: 0.4 ms
Lead Channel Setting Sensing Sensitivity: 0.45 mV
Zone Setting Status: 755011
Zone Setting Status: 755011

## 2022-05-25 ENCOUNTER — Other Ambulatory Visit: Payer: Self-pay | Admitting: Internal Medicine

## 2022-05-25 ENCOUNTER — Telehealth: Payer: Self-pay | Admitting: Student in an Organized Health Care Education/Training Program

## 2022-05-25 DIAGNOSIS — M5416 Radiculopathy, lumbar region: Secondary | ICD-10-CM

## 2022-05-25 DIAGNOSIS — G894 Chronic pain syndrome: Secondary | ICD-10-CM

## 2022-05-25 DIAGNOSIS — M47816 Spondylosis without myelopathy or radiculopathy, lumbar region: Secondary | ICD-10-CM

## 2022-05-25 NOTE — Telephone Encounter (Signed)
Spoke with pharmacy and he states that he told the patient not to get a partial fill but she insisted.  Called patient and she stAtes she did not know not to get a partial fill.  She was agitated, yelling and cursing.  She got 20 pills and should have gotten 150.  I can send you a refill request if you want to fill the rest of the prescription.

## 2022-05-25 NOTE — Telephone Encounter (Signed)
PT [facility] due to the fact that her current pharmacy [facility] only had 20 pill count to fill. Please give patient a call.TY

## 2022-05-26 ENCOUNTER — Other Ambulatory Visit: Payer: Self-pay

## 2022-05-26 DIAGNOSIS — G894 Chronic pain syndrome: Secondary | ICD-10-CM

## 2022-05-26 DIAGNOSIS — M47816 Spondylosis without myelopathy or radiculopathy, lumbar region: Secondary | ICD-10-CM

## 2022-05-26 DIAGNOSIS — M5416 Radiculopathy, lumbar region: Secondary | ICD-10-CM

## 2022-05-26 MED ORDER — OXYCODONE-ACETAMINOPHEN 10-325 MG PO TABS: 1.0000 | ORAL_TABLET | ORAL | 0 refills | Status: AC | PRN

## 2022-05-26 NOTE — Telephone Encounter (Signed)
Spoke with patient and notified her that Dr Holley Raring would be sending in her remaining prescription quantity.

## 2022-06-01 DIAGNOSIS — R8271 Bacteriuria: Secondary | ICD-10-CM | POA: Diagnosis not present

## 2022-06-01 DIAGNOSIS — Z8551 Personal history of malignant neoplasm of bladder: Secondary | ICD-10-CM | POA: Diagnosis not present

## 2022-06-02 ENCOUNTER — Encounter: Payer: Self-pay | Admitting: Student in an Organized Health Care Education/Training Program

## 2022-06-02 ENCOUNTER — Ambulatory Visit
Payer: Medicare Other | Attending: Student in an Organized Health Care Education/Training Program | Admitting: Student in an Organized Health Care Education/Training Program

## 2022-06-02 DIAGNOSIS — G894 Chronic pain syndrome: Secondary | ICD-10-CM

## 2022-06-02 DIAGNOSIS — M5416 Radiculopathy, lumbar region: Secondary | ICD-10-CM | POA: Diagnosis not present

## 2022-06-02 DIAGNOSIS — M48062 Spinal stenosis, lumbar region with neurogenic claudication: Secondary | ICD-10-CM | POA: Diagnosis not present

## 2022-06-02 NOTE — Progress Notes (Signed)
Patient: [name]  Service Category: E/M  Provider: [name], MD  DOB: [birth year]  DOS: 06/02/2022  Location: Office  MRN: [id number]  Setting: Ambulatory outpatient  Referring Provider: [name], MD  Type: Established Patient  Specialty: Interventional Pain Management  PCP: [name], MD  Location: Remote location  Delivery: TeleHealth     Virtual Encounter - Pain Management PROVIDER NOTE: Information contained herein reflects review and annotations entered in association with encounter. Interpretation of such information and data should be left to medically-trained personnel. Information provided to patient can be located elsewhere in the medical record under "Patient Instructions". Document created using STT-dictation technology, any transcriptional errors that may result from process are unintentional.    Contact & Pharmacy Preferred: [phone] Home: [phone] (home) Mobile: [phone] (mobile) E-mail: [email or web address]  [name] [id number]: [phone]: [phone]  Pre-screening  [name] offered "in-person" vs "virtual" encounter. She indicated preferring virtual for this encounter.   Reason COVID-19*  Social distancing based on CDC and AMA recommendations.   I contacted BMelynda Rippleon 06/02/2022 via telephone.      I clearly identified myself as BGillis Santa MD. I verified that I was speaking with the correct person using two identifiers (Name: BRENNE PITA and date of birth: 101-18-1942.  Consent I sought verbal advanced consent from BMelynda Ripplefor virtual visit interactions. I informed [name] of possible security and privacy concerns, risks, and limitations associated with providing "not-in-person" medical evaluation and management services. I also informed [name] of the availability of "in-person" appointments. Finally, I informed her that there would be  a charge for the virtual visit and that she could be  personally, fully or partially, financially responsible for it. Ms. LMccreadyexpressed understanding and agreed to proceed.   Historic Elements   Ms. BMALEA KENDRAis a 82y.o. year old, female patient evaluated today after our last contact on 05/25/2022. [name] has a past medical history of AICD (automatic cardioverter/defibrillator) present ([place] cardiologist--- dr kCaryl Comes), Arthritis, Bladder cancer ([facility] (urologist-  dr eJunious Silk, Chronic hyponatremia, Chronic low back pain with bilateral sciatica, Chronic systolic (congestive) heart failure ([facility], 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, Coronary artery disease (cardiologist-  dr aKathlyn [place], DDD (degenerative disc disease), thoracolumbar, Frequent urination, Full dentures, Gait instability, GERD (gastroesophageal reflux disease), History of iron deficiency anemia (11/2013), History of stomach ulcers (11/2013), Hypertension, LBBB (left bundle branch block), NICM (nonischemic cardiomyopathy) ([place] (last echo 12-31-2015 ef 55-60%), Nocturia more than twice per night, S/P insertion of spinal cord stimulator [facility] Dr [facility]isure(notes in care everywhere)), Scoliosis, Uses walker (02/18/2021), and Wears glasses. She also  has a past surgical history that includes Esophagogastroduodenoscopy (N/A, 11/28/2013); Transurethral resection of bladder tumor (N/A, 12/13/2013); Transurethral resection of bladder tumor (N/A, 01/17/2014); Cataract extraction w/ intraocular lens  implant, bilateral (Bilateral, right 12-2013 / left  02-2014); Cystoscopy with retrograde pyelogram, ureteroscopy and stent placement (Bilateral, 04/18/2014); Transurethral resection of bladder tumor (N/A, 04/18/2014); Lumbar disc surgery (1980's); bi-ventricular implantable cardioverter defibrillator (N/A, 05/07/2014); Cystoscopy w/ retrogrades (Bilateral, 01/06/2015); Cystoscopy with biopsy (N/A, 10/06/2015); Cardiac catheterization  (03-03-2014  [facility] of bladder ([facility]); Cardiovascular stress test (11/25/2013); transthoracic echocardiogram (12/31/2015   dr kCaryl Comes; Cystoscopy with fulgeration (N/A, 06/30/2017); Spinal cord stimulator implant (10-09-2018   '@[facility] ); Cystoscopy with biopsy (N/A, 02/12/2019); Transurethral resection  of bladder tumor (N/A, 12/13/2019); Transurethral resection of bladder tumor (N/A, 10/06/2020); colonscopy (11/2020); Cystoscopy with biopsy (N/A, 03/19/2021); ICD GENERATOR CHANGEOUT (N/A, 11/04/2021); Biv pacemaker generator change out; and Transurethral resection of bladder tumor (N/A, 11/30/2021). [name] has a current medication list which includes the following prescription(s): acetaminophen, amitriptyline, amlodipine, gabapentin, culturelle digestive daily pro, losartan, menthol (topical analgesic), metoprolol succinate, [START ON 06/24/2022] oxycodone-acetaminophen, oxycodone-acetaminophen, pantoprazole, polyethylene glycol, prochlorperazine, promethazine, and sucralfate, and the following Facility-Administered Medications: epirubicin, gemcitabine, and gemcitabine. She  reports that she has been smoking cigarettes. She has a 28.00 pack-year smoking history. She has never used smokeless tobacco. She reports that she does not drink alcohol and does not use drugs. [name] has No Known Allergies.  BMI: Estimated body mass index is 17.11 kg/m as calculated from the following:   Height as of 04/14/22: 5' 8.75" (1.746 m).   Weight as of 04/14/22: 115 lb (52.2 kg). Last encounter: 04/14/2022. Last procedure: Visit date not found.  HPI  Today, she is being contacted for worsening of previously known (established) problem  Patient is complaining of increased lumbar spine pain and states that her pain is very severe.  This is a chronic condition for [name] and she often requests an increase in her pain medications.  She has a spinal cord stimulator in place.  She is on  extremely high-dose opioid analgesics and I informed her that I will not be escalating any further.  I recommend that she get a second opinion but she does not want to do that.  We discussed a epidural steroid injection for persistent and severe low back and leg pain related to lumbar spinal stenosis and chronic lumbar radiculopathy.  Risks and benefits reviewed and patient would like to proceed.  Pharmacotherapy Assessment   Opioid Analgesic: Percocet 10 mg up to 5 times a day as needed for severe breakthrough pain, quantity 150/month; MME equals 75   Monitoring: [place] PMP: PDMP reviewed during this encounter.       Pharmacotherapy: No side-effects or adverse reactions reported. Compliance: No problems identified. Effectiveness: Clinically acceptable. Plan: Refer to "POC". UDS:  Summary  Date Value Ref Range Status  12/22/2021 Note  Final    Comment:    ==================================================================== [employer] Select 13 (MW) ==================================================================== Specimen Alert Note: Urinary creatinine is low; ability to detect some drugs may be compromised. Interpret results with caution. (Creatinine) ==================================================================== Test                             Result       Flag       Units  Drug Present and Declared for Prescription Verification   Oxycodone                      11927        EXPECTED   ng/mg creat   Oxymorphone                    13545        EXPECTED   ng/mg creat   Noroxycodone                   15409        EXPECTED   ng/mg creat   Noroxymorphone                 3791         EXPECTED  ng/mg creat    Sources of oxycodone are scheduled prescription medications.    Oxymorphone, noroxycodone, and noroxymorphone are expected    metabolites of oxycodone. Oxymorphone is also available as a    scheduled prescription  medication.  ==================================================================== Test                      Result    Flag   Units      Ref Range   Creatinine              11        LL     mg/dL      >=20 ==================================================================== Declared Medications:  The flagging and interpretation on this report are based on the  following declared medications.  Unexpected results may arise from  inaccuracies in the declared medications.   **Note: The testing scope of this panel includes these medications:   Oxycodone (Percocet)   **Note: The testing scope of this panel does not include the  following reported medications:   Acetaminophen (Tylenol)  Acetaminophen (Percocet)  Amitriptyline (Elavil)  Amlodipine (Norvasc)  Gabapentin (Neurontin)  Losartan (Cozaar)  Menthol  Metoprolol (Toprol)  Pantoprazole (Protonix)  Polyethylene Glycol (MiraLAX)  Promethazine (Phenergan)  Sucralfate (Carafate) ==================================================================== For clinical consultation, please call [phone]. ====================================================================    No results found for: "CBDTHCR", "D8THCCBX", "D9THCCBX"   Laboratory Chemistry Profile   Renal Lab Results  Component Value Date   BUN 11 02/21/2022   CREATININE 0.68 02/21/2022   BCR 18 04/28/2014   GFR 81.41 02/16/2022   GFRAA >60 06/17/2019   GFRNONAA >60 02/21/2022    Hepatic Lab Results  Component Value Date   AST 38 02/21/2022   ALT 27 02/21/2022   ALBUMIN 3.8 02/21/2022   ALKPHOS 88 02/21/2022   LIPASE 30 11/23/2013    Electrolytes Lab Results  Component Value Date   NA 130 (L) 02/21/2022   K 4.9 02/21/2022   CL 95 (L) 02/21/2022   CALCIUM 9.4 02/21/2022   MG 2.3 06/03/2019    Bone Lab Results  Component Value Date   VD25OH 36.17 01/11/2019    Inflammation (CRP: Acute Phase) (ESR: Chronic Phase) No results found for: "CRP",  "ESRSEDRATE", "LATICACIDVEN"       Note: Above Lab results reviewed.  Imaging  CUP PACEART REMOTE DEVICE CHECK Scheduled remote reviewed. Normal device function.    Next remote 91 days. LA  Assessment  The primary encounter diagnosis was Lumbar radiculopathy. Diagnoses of Spinal stenosis, lumbar region, with neurogenic claudication and Chronic pain syndrome were also pertinent to this visit.  Plan of Care  I spent a great deal of time discussing her chronic pain condition, central sensitization, opioid-induced hyperalgesia and how I do not recommend increasing her opioid analgesics further.  Patient already has a spinal cord stimulator in place which I encouraged her to use.  I also encouraged her to consider repeating physical therapy which she does not want to do.  At this time, we have limited options but I have discussed a repeat lumbar epidural steroid injection for her condition and patient would like to consider that.  Orders:  Orders Placed This Encounter  Procedures   Lumbar Epidural Injection    Standing Status:   Future    Standing Expiration Date:   09/02/2022    Scheduling Instructions:     Procedure: Interlaminar Lumbar Epidural Steroid injection (LESI)            Laterality: Midline  Sedation: without     Timeframe: ASAA    Order Specific Question:   Where will this procedure be performed?    Answer:   [facility]   Follow-up plan:   Return in about 2 weeks (around 06/16/2022) for L-ESI, in clinic NS.     Recent Visits Date Type Provider Dept  04/14/22 Office Visit [name], MD [facility]  Showing recent visits within past 90 days and meeting all other requirements Today's Visits Date Type Provider Dept  06/02/22 Office Visit [name], MD [facility]  Showing today's visits and meeting all other requirements Future Appointments Date Type Provider Dept  07/07/22 Appointment [name], MD [facility]   Showing future appointments within next 90 days and meeting all other requirements  I discussed the assessment and treatment plan with the patient. The patient was provided an opportunity to ask questions and all were answered. The patient agreed with the plan and demonstrated an understanding of the instructions.  Patient advised to call back or seek an in-person evaluation if the symptoms or condition worsens.  Duration of encounter: 30 minutes.  Note by: [name], MD Date: 06/02/2022; Time: 4:07 PM

## 2022-06-07 ENCOUNTER — Telehealth: Payer: Self-pay

## 2022-06-07 ENCOUNTER — Telehealth: Payer: Self-pay | Admitting: Internal Medicine

## 2022-06-07 ENCOUNTER — Other Ambulatory Visit: Payer: Medicare Other

## 2022-06-07 DIAGNOSIS — Z515 Encounter for palliative care: Secondary | ICD-10-CM

## 2022-06-07 NOTE — Progress Notes (Signed)
                                                  TELEPHONIC CHECK IN/VISIT   PC SW connected with patient via telephone to complete telephonic check in and schedule in person visit.   Back pain - patient shares that she continues to suffer from back pain and has pain medicine to assist in managing the symptoms but it limiting her mobility and ability to do things.  Patient stated that she was told that her pain medication is actually causing her pain. Patient inquired if PC offered a pain management program outside of what she is already receiving from pain management center MD. SW made patient aware of new PC changes and that PC NP no longer follow home PC patient to be able to assist with writing prescriptions. SW attempted to discuss more holistic approaches (i.e aroma therapy, acupuncture, massages, and or heating pads) patient stated she did not feel these interventions would work. Patient stated interest in a drug addiction program that may be able to help her taper off of the pain medications, since she was told they are ultimately causing her pain. Patient shared that her pain management doctor  tried a "opioid holiday" for 30 days with patient last year where he attempted to do just what patient is requesting now, and patient stated she was not able to complete the full 30 days because her pain was so intense. SW and patient any differences in re-attempting the same type of process/program again, patient shared that she would like to but would need something to still address her pain as she is trying to taper off of her current pain medication. SW advised patient that the only programs SW is aware of are NA programs of which typically stop patients cold turnkey from medicines. Patient share she has an appt at the pain clinic on 4/1 for a cortisone shot.    [facility] PT - no longer receiving   Appetite - patient share is eating fine and has caregiver that assist with meals.    Plan: patient declined  in person visit at this time. SW to [facility] patients pain clinic to discuss her concern/interest in tapering off her pain medication or entering into an opioid program to assist with this.    (12:47PM) Outgoing call to Dr. Holley Raring office. LVM for return call back from RN or SW to discuss patients concern/request as stated above.

## 2022-06-07 NOTE — Telephone Encounter (Signed)
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 Pt stated that she is still taking the pain medication you prescribe her but was wondering if there was something else that she can take to help with the pain. Pt is wanting to know if there is a program out there that can help her to get off of the pain medication.

## 2022-06-07 NOTE — Telephone Encounter (Signed)
Attempted to call [place], message left.

## 2022-06-07 NOTE — Telephone Encounter (Signed)
Patient called and wanted to speak to Janett Billow about her back pain that has been going on for years.

## 2022-06-07 NOTE — Telephone Encounter (Signed)
[name] from [employer] wants a nurse or social worker to call her to speak about a concern that the patient called them with.

## 2022-06-07 NOTE — Telephone Encounter (Signed)
Spoke with pt and scheduled her for the next available appt on the 28th at 12pm.

## 2022-06-08 NOTE — Telephone Encounter (Signed)
Attempted to call [place] again, message left, asking her to call our office.

## 2022-06-08 NOTE — Telephone Encounter (Signed)
Spoke with [place], patient wants to discontinue her pain meds., [place] asking how to proceed with this. I advised her to tell Ms. Dobosz to continue to take them for now and discuss this with Dr. Holley Raring at next appointment.

## 2022-06-10 ENCOUNTER — Other Ambulatory Visit: Payer: Medicare Other

## 2022-06-10 DIAGNOSIS — Z515 Encounter for palliative care: Secondary | ICD-10-CM

## 2022-06-10 NOTE — Progress Notes (Signed)
TELEPHONE ENCOUNTER  Telephone call to patient to follow up on her request and interest in a program to assist in weaning her off her pain medications, while still providing pain management support. SW made patient aware that SW is not aware of programs of this nature and advised that SW spoke with her pain doctor office who advised that patient continue to take her pain medication as prescribed until she visits the clinic on 4/1 and speak with her pain doctor about her wishes at that time. Patient stated understanding and appreciation of SW call and follow up.  At this time patient wishes to remain on PC services but only wants to call PC when she needs to. SW advised that patient will be placed on volunteer call list for now and will recive calls from the volunteers but not from clinical staff. Patient stated agreement. Should patient be stable over the next 30-60 days of volunteer outreaches, patient will be reviewed for DC from [facility] services.Marland Kitchen

## 2022-06-15 ENCOUNTER — Encounter: Payer: Self-pay | Admitting: Oncology

## 2022-06-16 ENCOUNTER — Telehealth (INDEPENDENT_AMBULATORY_CARE_PROVIDER_SITE_OTHER): Payer: Medicare Other | Admitting: Internal Medicine

## 2022-06-16 ENCOUNTER — Encounter: Payer: Self-pay | Admitting: Internal Medicine

## 2022-06-16 VITALS — Ht 68.75 in | Wt 115.0 lb

## 2022-06-16 DIAGNOSIS — G894 Chronic pain syndrome: Secondary | ICD-10-CM

## 2022-06-16 MED ORDER — CELECOXIB 200 MG PO CAPS: 200.0000 mg | ORAL_CAPSULE | Freq: Two times a day (BID) | ORAL | 0 refills | Status: DC

## 2022-06-16 NOTE — Patient Instructions (Signed)
Increase the amitriptyline dose to 75 mg at bedtime.  If tolerated without excessive sedation,  you can increase to 100 mg after one week.  This medication may help your chronic pain   If you continue to use advil or motrin you may have another stomach ulcer.  I have prescribed celebrex to use every 12 hours,  this is still an NSAID, , but is more selective and less likely to cause stomach irritation  Do not reduce your oxycodone until you talk to Dr Holley Raring

## 2022-06-16 NOTE — Assessment & Plan Note (Signed)
I have recommended that she increase her amitiptyline dose to 75 mg daily followed by increase to 100 mg daily if tolerated.  Adding celebrex and advised to stop using all OTC NSAIDs which she has agreed to do

## 2022-06-16 NOTE — Progress Notes (Signed)
Telephone Visit Note   This format is felt to be most appropriate for this patient at this time.  All issues noted in this document were discussed and addressed.  No physical exam was performed (except for noted visual exam findings with Video Visits).   I attempted to connect  with Ms [name] on 06/16/22 at 12:00 PM EDT by telephone   and verified that I am speaking with the correct person using two identifiers. Location patient: home Location provider: work or home office Persons participating in the virtual visit: patient, provider  I discussed the limitations, risks, security and privacy concerns of performing an evaluation and management service by telephone and the availability of in person appointments. I also discussed with the patient that there may be a patient responsible charge related to this service. The patient expressed understanding and agreed to proceed.  Interactive audio and video telecommunications were  initially attempted between this provider and patient, however failed, due to patient having technical difficulties .  We continued and completed visit with audio only.   Reason for visit: pain (chronic)   HPI:  82 yr old female with chronic back pain, opioid dependent,  due to spinal stenosis with lumbar radiculopathy, managed by Dr Holley Raring at the [facility] with oxycodone/apap 10/325 at a maximal dose of 5 tablets daily,  presents for discussion of pain management and opioid dependence. She also has a spinal cord stimulator   She reports continued severe unrelenting pain despite maximal use of oxycodone. Last refill was on March 8 for # tablets (20 tablets filled on March 5) .  She admits that she is using "massive" amounts of acetaminophen and ibuprofen and advil " in addition to her narcotics.   She has a history of gastric ulcer and is currently taking pantoprazole in the am and sucralfate bid to qid prn .  She was weaned down to 2 oxycodone daily  over 30 days in May 2023   but did not tolerate the reduction  to one tablet every 12 hours and has returned to her previous level of use.  She would like to have her pain managed an alternative way,  and is inquiring about any clinics available.  I explained that methadone clinics were the only clinics I knew of for alternative medication and advised her to revisit the drug holiday pl with Dr Holley Raring after her scheduled ESI on April 1   She is aware of the health risks risks of excessive acetaminophen use and of NSAIDS but has no plans to stop using because "I am desperate."  I offered celebrex as a potentially safer alternative,  as well as increasing her amitripytline dose if tolerated    ROS: See pertinent positives and negatives per HPI.  Past Medical History:  Diagnosis Date   AICD (automatic cardioverter/defibrillator) present EP cardiologist--- dr Caryl Comes    placement 05-07-2014 , ef 25%,  NICM/  (02-11-2019 last echo 08-31-2018 ef 60-65%)   Arthritis    "in about all my joints; for sure in my back"   Bladder cancer [facility] urologist-  dr Junious Silk   dx 1995--  recurrent bladder cancer 2015 , s/p TURBT's and chemo instillation's ;   04/ 2019  s/p TURBT   Chronic hyponatremia    Chronic low back pain with bilateral sciatica    s/p  spinal cord stimulator @ [facility]  [id number]   Chronic systolic (congestive) heart failure [facility])    cardiologist-  dr Rogue Jury   COPD with emphysema (  [place])    (02-11-2019  per pt has never been on oxygen)   Coronary artery disease cardiologist-  dr Kathlyn [place]   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% per cardiac cath 03-03-2014   DDD (degenerative disc disease), thoracolumbar    Frequent urination    Full dentures    Gait instability    due to chronic low back pain, uses roller walker   GERD (gastroesophageal reflux disease)    History of iron deficiency anemia 11/2013   resolved w/ IV Iron  (02-11-2019  per pt has not had any issues since 2015)   History of stomach ulcers 11/2013    Hypertension    LBBB (left bundle branch block)    NICM (nonischemic cardiomyopathy) ([place]) last echo 12-31-2015 ef 55-60%   dx 09/ 2015 per echo 20%;  myoview 09/ 2015 ef 28%;  per cardiac cath 12/ 2015 ef 30%;     Nocturia more than twice per night    S/P insertion of spinal cord stimulator followed by [facility]---- Dr Pecola Leisure (notes in care everywhere)   10-09-2018  @[facility] --- thoracic spinal cord stimular/ generator  (device from [facility])---- per pt has a control   Scoliosis    Uses walker 02/18/2021   all the time   Wears glasses    for reading    Past Surgical History:  Procedure Laterality Date   BI-VENTRICULAR IMPLANTABLE CARDIOVERTER DEFIBRILLATOR N/A 05/07/2014   Procedure: BI-VENTRICULAR IMPLANTABLE CARDIOVERTER DEFIBRILLATOR  (CRT-D);  Surgeon: Deboraha Sprang, MD;  Location: [facility] CATH LAB;  Service: Cardiovascular;  Laterality: N/A;   BIV PACEMAKER GENERATOR CHANGE OUT     CARDIAC CATHETERIZATION  03-03-2014  dr Kathlyn [place]   [facility]   pLAD 20%, pRCA 20%, dRCA 50%, RPLS 50%;  ef 30%, mild elevated LVEDP, mild gradient across aortic valve LVOT   CARDIOVASCULAR STRESS TEST  11/25/2013   High risk nuclear study w/ large high severity inferior wall perfusion defect on stress and rest images, large mild severity anteroseptal wall perfusion defect on stress and rest images, No inducible ischemia/ global moderate hypokinesis, ef 28%   CATARACT EXTRACTION W/ INTRAOCULAR LENS  IMPLANT, BILATERAL Bilateral right 12-2013 / left  02-2014   colonscopy  11/2020   benign polyps removed   CYSTOSCOPY W/ RETROGRADES Bilateral 01/06/2015   Procedure: CYSTOSCOPY WITH  BLADDER BIOPSY BILATERAL RETROGRADE PYELOGRAM,INSTILLATION OF MITOMYCIN C;  Surgeon: [name], MD;  Location: [facility];  Service: Urology;  Laterality: Bilateral;   CYSTOSCOPY WITH BIOPSY N/A 10/06/2015   Procedure: CYSTO WITH BLADDER BIOPSY, FULGERATION, CHEMO IRRIGATION EPIRUBICIN IN PACU;  Surgeon: [name], MD;   Location: [facility];  Service: Urology;  Laterality: N/A;   CYSTOSCOPY WITH BIOPSY N/A 02/12/2019   Procedure: CYSTOSCOPY WITH BIOPSY/ FULGURATION/ INSTILLATION OF GEMCITABINE, bilateral retrograde turbt greater 5cm;  Surgeon: [name], MD;  Location: [facility];  Service: Urology;  Laterality: N/A;   CYSTOSCOPY WITH BIOPSY N/A 03/19/2021   Procedure: CYSTOSCOPY WITH TRANSURETHRAL RESECTION OF BLADDER TUMOR  GREATER THAN 5CM WITH POST-OPERATIVE INSTILLATION OF EPIRUBICIN;  Surgeon: [name], MD;  Location: [facility];  Service: Urology;  Laterality: N/A;   CYSTOSCOPY WITH FULGERATION N/A 06/30/2017   Procedure: Marland Kitchen CYSTOSCOPY WITH [place] FULGERATION/ BLADDER BIOPSY/ INSTILLATION OF EPIRUBICIN;  Surgeon: [name], MD;  Location: [facility];  Service: Urology;  Laterality: N/A;   CYSTOSCOPY WITH RETROGRADE PYELOGRAM, URETEROSCOPY AND STENT PLACEMENT Bilateral 04/18/2014   Procedure: CYSTOSCOPY WITH RETROGRADE PYELOGRAM;  Surgeon: Rodman Key  Junious Silk, MD;  Location: [facility];  Service: Urology;  Laterality: Bilateral;   ESOPHAGOGASTRODUODENOSCOPY N/A 11/28/2013   Procedure: ESOPHAGOGASTRODUODENOSCOPY (EGD);  Surgeon: Arta Silence, MD;  Location: [facility] ENDOSCOPY;  Service: Endoscopy;  Laterality: N/A;   ICD GENERATOR CHANGEOUT N/A 11/04/2021   Procedure: ICD GENERATOR CHANGEOUT;  Surgeon: Deboraha Sprang, MD;  Location: [place] CV LAB;  Service: Cardiovascular;  Laterality: N/A;   LUMBAR DISC SURGERY  1980's   "ruptured disc"   SPINAL CORD STIMULATOR IMPLANT  10-09-2018   @[facility]    Thoracic spinal cord stimular/ genertor  (left flank)----- (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[employer])   TRANSTHORACIC ECHOCARDIOGRAM  12/31/2015   dr Caryl Comes   ef [id number], grade 1 diastolic dysfunction/ mild MR/ septal motion showed abnormal function and dyssynergy   TRANSURETHRAL RESECTION OF BLADDER  1995   TRANSURETHRAL RESECTION OF BLADDER TUMOR N/A 12/13/2013   Procedure:  TRANSURETHRAL RESECTION OF BLADDER TUMOR (TURBT);  Surgeon: [name], MD;  Location: [facility];  Service: Urology;  Laterality: N/A;   TRANSURETHRAL RESECTION OF BLADDER TUMOR N/A 01/17/2014   Procedure: TRANSURETHRAL RESECTION OF BLADDER TUMOR (TURBT);  Surgeon: [name], MD;  Location: [facility];  Service: Urology;  Laterality: N/A;   TRANSURETHRAL RESECTION OF BLADDER TUMOR N/A 04/18/2014   Procedure: TRANSURETHRAL RESECTION OF BLADDER TUMOR (TURBT), CYSTOGRAM;  Surgeon: [name], MD;  Location: [facility];  Service: Urology;  Laterality: N/A;   TRANSURETHRAL RESECTION OF BLADDER TUMOR N/A 12/13/2019   Procedure: TRANSURETHRAL RESECTION OF BLADDER TUMOR (TURBT) GREATER THAN 5CM WITH CYSTOSCOPY/ POST OPERATIVE INSTILLATION OF GEMCITABINE;  Surgeon: [name], MD;  Location: [facility];  Service: Urology;  Laterality: N/A;   TRANSURETHRAL RESECTION OF BLADDER TUMOR N/A 10/06/2020   Procedure: TRANSURETHRAL RESECTION OF BLADDER TUMOR (TURBT)/ CYSTOSCOPY/  BLADDER BIOPSY;  Surgeon: [name], MD;  Location: [facility];  Service: Urology;  Laterality: N/A;   TRANSURETHRAL RESECTION OF BLADDER TUMOR N/A 11/30/2021   Procedure: TRANSURETHRAL RESECTION OF BLADDER TUMOR (TURBT) WITH POST OPERATIVE INSTILLATION OF GEMCITABINE;  Surgeon: [name], MD;  Location: [facility];  Service: Urology;  Laterality: N/A;    Family History  Problem Relation Age of Onset   Cancer Mother    Cancer Brother     SOCIAL HX:  reports that she has been smoking cigarettes. She has a 28.00 pack-year smoking history. She has never used smokeless tobacco. She reports that she does not drink alcohol and does not use drugs.    Current Outpatient Medications:    acetaminophen (TYLENOL) 500 MG tablet, Take 1,000 mg by mouth every 8 (eight) hours as needed for moderate pain., Disp: , Rfl:    amitriptyline (ELAVIL) 50 MG tablet, TAKE 1 TABLET(50 MG) BY MOUTH AT BEDTIME, Disp: 90  tablet, Rfl: 1   amLODipine (NORVASC) 5 MG tablet, Take 1 tablet (5 mg total) by mouth daily., Disp: 90 tablet, Rfl: 2   celecoxib (CELEBREX) 200 MG capsule, Take 1 capsule (200 mg total) by mouth 2 (two) times daily., Disp: 60 capsule, Rfl: 0   gabapentin (NEURONTIN) 300 MG capsule, TAKE 2 CAPSULES(600 MG) BY MOUTH THREE TIMES DAILY, Disp: 270 capsule, Rfl: 1   losartan (COZAAR) 100 MG tablet, Take 1 tablet (100 mg total) by mouth at bedtime., Disp: 90 tablet, Rfl: 1   Menthol, Topical Analgesic, (BIOFREEZE EX), Apply 1 application topically 4 (four) times daily as needed (back pain)., Disp: , Rfl:    metoprolol succinate (TOPROL-XL) 25 MG 24 hr tablet, TAKE 1 TABLET(25 MG) BY MOUTH  DAILY, Disp: 90 tablet, Rfl: 3   [START ON 06/24/2022] oxyCODONE-acetaminophen (PERCOCET) 10-325 MG tablet, Take 1 tablet by mouth every 4 (four) hours as needed for pain., Disp: 150 tablet, Rfl: 0   oxyCODONE-acetaminophen (PERCOCET) 10-325 MG tablet, Take 1 tablet by mouth every 4 (four) hours as needed for up to 27 days for pain., Disp: 130 tablet, Rfl: 0   pantoprazole (PROTONIX) 40 MG tablet, Take 1 tablet (40 mg total) by mouth at bedtime., Disp: 90 tablet, Rfl: 2   polyethylene glycol (MIRALAX / GLYCOLAX) packet, Take 17 g by mouth daily as needed for mild constipation. , Disp: , Rfl:    prochlorperazine (COMPAZINE) 10 MG tablet, Take 1 tablet (10 mg total) by mouth every 6 (six) hours as needed for nausea or vomiting., Disp: 30 tablet, Rfl: 1   solifenacin (VESICARE) 5 MG tablet, Take 5 mg by mouth daily., Disp: , Rfl:    sucralfate (CARAFATE) 1 g tablet, TAKE 1 TABLET BY MOUTH ONE HOUR BEFORE EACH MEAL AND TWO HOURS AFTER OTHER MEDICATIONS (Patient taking differently: Take 1 g by mouth See admin instructions. Take 1 tablet one hour before each meal and 2 hours after all other medications), Disp: 90 tablet, Rfl: 1 No current facility-administered medications for this visit.  Facility-Administered Medications  Ordered in Other Visits:    epirubicin (ELLENCE) 50 mg in sodium chloride 0.9 % bladder instillation, 50 mg, Bladder Instillation, Once, [name], MD   gemcitabine [facility]) chemo syringe for bladder instillation 2,000 mg, 2,000 mg, Bladder Instillation, Once, [name], MD   gemcitabine [facility]) chemo syringe for bladder instillation 2,000 mg, 2,000 mg, Bladder Instillation, Once, [name], MD  Jasmine DecemberTonette Bihari per patient if applicable:  General appearance: alert, cooperative and articulate.  No signs of being in distress  Lungs: not short of breath ,  No cough, speaking in full sentences  Psych: affect normal,dspeech is articulate and non pressured .  Denies suicidal thoughts     ASSESSMENT AND PLAN: Chronic pain syndrome Assessment & Plan: I have recommended that she increase her amitiptyline dose to 75 mg daily followed by increase to 100 mg daily if tolerated.  Adding celebrex and advised to stop using all OTC NSAIDs which she has agreed to do    Other orders -     Celecoxib; Take 1 capsule (200 mg total) by mouth 2 (two) times daily.  Dispense: 60 capsule; Refill: 0      I discussed the assessment and treatment plan with the patient. The patient was provided an opportunity to ask questions and all were answered. The patient agreed with the plan and demonstrated an understanding of the instructions.   The patient was advised to call back or seek an in-person evaluation if the symptoms worsen or if the condition fails to improve as anticipated.   I spent 22  minutes dedicated to the care of this patient on the date of this telephone encounter to include pre-visit review of his medical history,  non  Face-to-face time with the patient , and post visit ordering of testing and therapeutics.    Bianca Mc, MD

## 2022-06-20 ENCOUNTER — Ambulatory Visit
Admission: RE | Admit: 2022-06-20 | Discharge: 2022-06-20 | Disposition: A | Discharge status: Home / Self Care | Payer: Medicare Other | Source: Ambulatory Visit | Attending: Student in an Organized Health Care Education/Training Program | Admitting: Student in an Organized Health Care Education/Training Program

## 2022-06-20 ENCOUNTER — Encounter: Payer: Self-pay | Admitting: Student in an Organized Health Care Education/Training Program

## 2022-06-20 ENCOUNTER — Ambulatory Visit
Payer: Medicare Other | Attending: Student in an Organized Health Care Education/Training Program | Admitting: Student in an Organized Health Care Education/Training Program

## 2022-06-20 VITALS — BP 178/108 | HR 86 | Temp 97.2°F | Resp 16 | Ht 68.0 in | Wt 115.0 lb

## 2022-06-20 DIAGNOSIS — M48062 Spinal stenosis, lumbar region with neurogenic claudication: Secondary | ICD-10-CM | POA: Diagnosis not present

## 2022-06-20 DIAGNOSIS — M5416 Radiculopathy, lumbar region: Secondary | ICD-10-CM | POA: Diagnosis not present

## 2022-06-20 DIAGNOSIS — G894 Chronic pain syndrome: Secondary | ICD-10-CM | POA: Insufficient documentation

## 2022-06-20 MED ORDER — SODIUM CHLORIDE (PF) 0.9 % IJ SOLN
INTRAMUSCULAR | Status: AC
  Filled 2022-06-20: qty 10

## 2022-06-20 MED ORDER — DEXAMETHASONE SODIUM PHOSPHATE 10 MG/ML IJ SOLN
10.0000 mg | Freq: Once | INTRAMUSCULAR | Status: AC
  Administered 2022-06-20: 10 mg

## 2022-06-20 MED ORDER — LIDOCAINE HCL 2 % IJ SOLN
INTRAMUSCULAR | Status: AC
  Filled 2022-06-20: qty 20

## 2022-06-20 MED ORDER — SODIUM CHLORIDE 0.9% FLUSH
2.0000 mL | Freq: Once | INTRAVENOUS | Status: AC
  Administered 2022-06-20: 2 mL

## 2022-06-20 MED ORDER — ROPIVACAINE HCL 2 MG/ML IJ SOLN
2.0000 mL | Freq: Once | INTRAMUSCULAR | Status: AC
  Administered 2022-06-20: 2 mL via EPIDURAL

## 2022-06-20 MED ORDER — IOHEXOL 180 MG/ML  SOLN
INTRAMUSCULAR | Status: AC
  Filled 2022-06-20: qty 20

## 2022-06-20 MED ORDER — DEXAMETHASONE SODIUM PHOSPHATE 10 MG/ML IJ SOLN
INTRAMUSCULAR | Status: AC
  Filled 2022-06-20: qty 1

## 2022-06-20 MED ORDER — ROPIVACAINE HCL 2 MG/ML IJ SOLN
INTRAMUSCULAR | Status: AC
  Filled 2022-06-20: qty 20

## 2022-06-20 MED ORDER — LIDOCAINE HCL 2 % IJ SOLN
20.0000 mL | Freq: Once | INTRAMUSCULAR | Status: AC
  Administered 2022-06-20: 400 mg

## 2022-06-20 MED ORDER — IOHEXOL 180 MG/ML  SOLN
10.0000 mL | Freq: Once | INTRAMUSCULAR | Status: AC
  Administered 2022-06-20: 10 mL via EPIDURAL

## 2022-06-20 NOTE — Patient Instructions (Signed)

## 2022-06-20 NOTE — Progress Notes (Signed)
PROVIDER NOTE: Interpretation of information contained herein should be left to medically-trained personnel. Specific patient instructions are provided elsewhere under "Patient Instructions" section of medical record. This document was created in part using STT-dictation technology, any transcriptional errors that may result from this process are unintentional.  Patient: [name] Type: Established DOB: [birth year] MRN: [id number] PCP: [name], MD  Service: Procedure DOS: 06/20/2022 Setting: Ambulatory Location: Ambulatory outpatient facility Delivery: Face-to-face Provider: [name], MD Specialty: Interventional Pain Management Specialty designation: 09 Location: Outpatient facility Ref. Prov.: [name], MD       Interventional Therapy   Procedure: Lumbar epidural steroid injection (LESI) (interlaminar) #1    Laterality: Midline   Level:  L4-5 Level.  Imaging: Fluoroscopic guidance         Anesthesia: Local anesthesia (1-2% Lidocaine) DOS: 06/20/2022  Performed by: [name], MD  Purpose: Diagnostic/Therapeutic Indications: Lumbar radicular pain of intraspinal etiology of more than 4 weeks that has failed to respond to conservative therapy and is severe enough to impact quality of life or function. 1. Lumbar radiculopathy   2. Spinal stenosis, lumbar region, with neurogenic claudication   3. Chronic pain syndrome    NAS-11 Pain score:   Pre-procedure: 9 /10   Post-procedure: 8 /10      Position / Prep / Materials:  Position: Prone w/ head of the table raised (slight reverse trendelenburg) to facilitate breathing.  Prep solution: DuraPrep (Iodine Povacrylex [0.7% available iodine] and Isopropyl Alcohol, 74% w/w) Prep Area: Entire Posterior Lumbar Region from lower scapular tip down to mid buttocks area and from flank to flank. Materials:  Tray: Epidural tray Needle(s):  Type: Epidural needle (Tuohy) Gauge (G):  22 Length: Regular (3.5-in) Qty:  1   Pre-op H&P Assessment:  [name] is a 82 y.o. (year old), female patient, seen today for interventional treatment. She  has a past surgical history that includes Esophagogastroduodenoscopy (N/A, 11/28/2013); Transurethral resection of bladder tumor (N/A, 12/13/2013); Transurethral resection of bladder tumor (N/A, 01/17/2014); Cataract extraction w/ intraocular lens  implant, bilateral (Bilateral, right 12-2013 / left  02-2014); Cystoscopy with retrograde pyelogram, ureteroscopy and stent placement (Bilateral, 04/18/2014); Transurethral resection of bladder tumor (N/A, 04/18/2014); Lumbar disc surgery (1980's); bi-ventricular implantable cardioverter defibrillator (N/A, 05/07/2014); Cystoscopy w/ retrogrades (Bilateral, 01/06/2015); Cystoscopy with biopsy (N/A, 10/06/2015); Cardiac catheterization (03-03-2014  dr Kathlyn [place]   [facility]); Transurethral resection of bladder (1995); Cardiovascular stress test (11/25/2013); transthoracic echocardiogram (12/31/2015   dr Caryl Comes); Cystoscopy with fulgeration (N/A, 06/30/2017); Spinal cord stimulator implant (10-09-2018   @[facility] ); Cystoscopy with biopsy (N/A, 02/12/2019); Transurethral resection of bladder tumor (N/A, 12/13/2019); Transurethral resection of bladder tumor (N/A, 10/06/2020); colonscopy (11/2020); Cystoscopy with biopsy (N/A, 03/19/2021); ICD GENERATOR CHANGEOUT (N/A, 11/04/2021); Biv pacemaker generator change out; and Transurethral resection of bladder tumor (N/A, 11/30/2021). [name] has a current medication list which includes the following prescription(s): acetaminophen, amitriptyline, amlodipine, celecoxib, gabapentin, losartan, menthol (topical analgesic), metoprolol succinate, [START ON 06/24/2022] oxycodone-acetaminophen, oxycodone-acetaminophen, pantoprazole, polyethylene glycol, prochlorperazine, solifenacin, and sucralfate, and the following Facility-Administered Medications: epirubicin, gemcitabine, and gemcitabine. Her primarily concern today  is the Rectal Pain (Buttocks )  Initial Vital Signs:  Pulse/HCG Rate: 86  Temp: (!) 97.2 F (36.2 C) Resp: 14 BP: (!) 155/99 SpO2: 94 %  BMI: Estimated body mass index is 17.49 kg/m as calculated from the following:   Height as of this encounter: 5\' 8"  (1.727 m).   Weight as of this encounter: 115 lb (52.2 kg).  Risk Assessment: Allergies: Reviewed. She  has No Known Allergies.  Allergy Precautions: None required Coagulopathies: Reviewed. None identified.  Blood-thinner therapy: None at this time Active Infection(s): Reviewed. None identified. Ms. Steenberg is afebrile  Site Confirmation: [name] was asked to confirm the procedure and laterality before marking the site Procedure checklist: Completed Consent: Before the procedure and under the influence of no sedative(s), amnesic(s), or anxiolytics, the patient was informed of the treatment options, risks and possible complications. To fulfill our ethical and legal obligations, as recommended by the American Medical Association's Code of Ethics, I have informed the patient of my clinical impression; the nature and purpose of the treatment or procedure; the risks, benefits, and possible complications of the intervention; the alternatives, including doing nothing; the risk(s) and benefit(s) of the alternative treatment(s) or procedure(s); and the risk(s) and benefit(s) of doing nothing. The patient was provided information about the general risks and possible complications associated with the procedure. These may include, but are not limited to: failure to achieve desired goals, infection, bleeding, organ or nerve damage, allergic reactions, paralysis, and death. In addition, the patient was informed of those risks and complications associated to Spine-related procedures, such as failure to decrease pain; infection (i.e.: Meningitis, epidural or intraspinal abscess); bleeding (i.e.: epidural hematoma, subarachnoid hemorrhage, or any other type of  intraspinal or peri-dural bleeding); organ or nerve damage (i.e.: Any type of peripheral nerve, nerve root, or spinal cord injury) with subsequent damage to sensory, motor, and/or autonomic systems, resulting in permanent pain, numbness, and/or weakness of one or several areas of the body; allergic reactions; (i.e.: anaphylactic reaction); and/or death. Furthermore, the patient was informed of those risks and complications associated with the medications. These include, but are not limited to: allergic reactions (i.e.: anaphylactic or anaphylactoid reaction(s)); adrenal axis suppression; blood sugar elevation that in diabetics may result in ketoacidosis or comma; water retention that in patients with history of congestive heart failure may result in shortness of breath, pulmonary edema, and decompensation with resultant heart failure; weight gain; swelling or edema; medication-induced neural toxicity; particulate matter embolism and blood vessel occlusion with resultant organ, and/or nervous system infarction; and/or aseptic necrosis of one or more joints. Finally, the patient was informed that Medicine is not an exact science; therefore, there is also the possibility of unforeseen or unpredictable risks and/or possible complications that may result in a catastrophic outcome. The patient indicated having understood very clearly. We have given the patient no guarantees and we have made no promises. Enough time was given to the patient to ask questions, all of which were answered to the patient's satisfaction. Ms. Preziosi has indicated that she wanted to continue with the procedure. Attestation: I, the ordering provider, attest that I have discussed with the patient the benefits, risks, side-effects, alternatives, likelihood of achieving goals, and potential problems during recovery for the procedure that I have provided informed consent. Date  Time: 06/20/2022 10:35 AM   Pre-Procedure Preparation:  Monitoring: As  per clinic protocol. Respiration, ETCO2, SpO2, BP, heart rate and rhythm monitor placed and checked for adequate function Safety Precautions: Patient was assessed for positional comfort and pressure points before starting the procedure. Time-out: I initiated and conducted the "Time-out" before starting the procedure, as per protocol. The patient was asked to participate by confirming the accuracy of the "Time Out" information. Verification of the correct person, site, and procedure were performed and confirmed by me, the nursing staff, and the patient. "Time-out" conducted as per Joint Commission's Universal Protocol (UP.01.01.01). Time: 1104 Start Time:  1104 hrs.  Description/Narrative of Procedure:          Target: Epidural space via interlaminar opening, initially targeting the lower laminar border of the superior vertebral body. Region: Lumbar Approach: Percutaneous paravertebral  Rationale (medical necessity): procedure needed and proper for the diagnosis and/or treatment of the patient's medical symptoms and needs. Procedural Technique Safety Precautions: Aspiration looking for blood return was conducted prior to all injections. At no point did we inject any substances, as a needle was being advanced. No attempts were made at seeking any paresthesias. Safe injection practices and needle disposal techniques used. Medications properly checked for expiration dates. SDV (single dose vial) medications used. Description of the Procedure: Protocol guidelines were followed. The procedure needle was introduced through the skin, ipsilateral to the reported pain, and advanced to the target area. Bone was contacted and the needle walked caudad, until the lamina was cleared. The epidural space was identified using "loss-of-resistance technique" with 2-3 ml of PF-NaCl (0.9% NSS), in a 5cc LOR glass syringe.  Vitals:   06/20/22 1042 06/20/22 1058 06/20/22 1100 06/20/22 1110  BP: (!) 155/99 (!) 178/113 (!)  181/109 (!) 178/108  Pulse: 86 97 85 86  Resp:  14 15 16   Temp: (!) 97.2 F (36.2 C)     TempSrc: Temporal     SpO2: 94% 98% 97% 98%  Weight: 115 lb (52.2 kg)     Height: 5\' 8"  (1.727 m)      5 cc solution made of 2 cc of preservative-free saline, 2 cc of 0.2% ropivacaine, 1 cc of Decadron 10 mg/cc.   Start Time: 1104 hrs. End Time: 1108 hrs.  Imaging Guidance (Spinal):          Type of Imaging Technique: Fluoroscopy Guidance (Spinal) Indication(s): Assistance in needle guidance and placement for procedures requiring needle placement in or near specific anatomical locations not easily accessible without such assistance. Exposure Time: Please see nurses notes. Contrast: Before injecting any contrast, we confirmed that the patient did not have an allergy to iodine, shellfish, or radiological contrast. Once satisfactory needle placement was completed at the desired level, radiological contrast was injected. Contrast injected under live fluoroscopy. No contrast complications. See chart for type and volume of contrast used. Fluoroscopic Guidance: I was personally present during the use of fluoroscopy. "Tunnel Vision Technique" used to obtain the best possible view of the target area. Parallax error corrected before commencing the procedure. "Direction-depth-direction" technique used to introduce the needle under continuous pulsed fluoroscopy. Once target was reached, antero-posterior, oblique, and lateral fluoroscopic projection used confirm needle placement in all planes. Images permanently stored in EMR. Interpretation: I personally interpreted the imaging intraoperatively. Adequate needle placement confirmed in multiple planes. Appropriate spread of contrast into desired area was observed. No evidence of afferent or efferent intravascular uptake. No intrathecal or subarachnoid spread observed. Permanent images saved into the patient's record.  Antibiotic Prophylaxis:   Anti-infectives (From  admission, onward)    None      Indication(s): None identified  Post-operative Assessment:  Post-procedure Vital Signs:  Pulse/HCG Rate: 86  Temp: (!) 97.2 F (36.2 C) Resp: 16 BP: (!) 178/108 SpO2: 98 %  EBL: None  Complications: No immediate post-treatment complications observed by team, or reported by patient.  Note: The patient tolerated the entire procedure well. A repeat set of vitals were taken after the procedure and the patient was kept under observation following institutional policy, for this type of procedure. Post-procedural neurological assessment was performed, showing return to  baseline, prior to discharge. The patient was provided with post-procedure discharge instructions, including a section on how to identify potential problems. Should any problems arise concerning this procedure, the patient was given instructions to immediately contact us, at any time, without hesitation. In any case, we plan to contact the patient by telephone for a follow-up status report regarding this interventional procedure.  Comments:  No additional relevant information.  Plan of Care (POC)  Orders:  Orders Placed This Encounter  Procedures   DG PAIN CLINIC C-ARM 1-60 MIN NO REPORT    Intraoperative interpretation by procedural physician at [place].    Standing Status:   Standing    Number of Occurrences:   1    Order Specific Question:   Reason for exam:    Answer:   Assistance in needle guidance and placement for procedures requiring needle placement in or near specific anatomical locations not easily accessible without such assistance.   Chronic Opioid Analgesic:  Percocet 10 mg up to 5 times a day as needed for severe breakthrough pain, quantity 150/month; MME equals 75   Medications ordered for procedure: Meds ordered this encounter  Medications   iohexol (OMNIPAQUE) 180 MG/ML injection 10 mL    Must be Myelogram-compatible. If not available, you may  substitute with a water-soluble, non-ionic, hypoallergenic, myelogram-compatible radiological contrast medium.   lidocaine (XYLOCAINE) 2 % (with pres) injection 400 mg   sodium chloride flush (NS) 0.9 % injection 2 mL   ropivacaine (PF) 2 mg/mL (0.2%) (NAROPIN) injection 2 mL   dexamethasone (DECADRON) injection 10 mg   Medications administered: We administered iohexol, lidocaine, sodium chloride flush, ropivacaine (PF) 2 mg/mL (0.2%), and dexamethasone.  See the medical record for exact dosing, route, and time of administration.  Follow-up plan:   Return for Keep sch. appt.       Recent Visits Date Type Provider Dept  06/02/22 Office Visit [name], MD [facility]  04/14/22 Office Visit [name], MD [facility]  Showing recent visits within past 90 days and meeting all other requirements Today's Visits Date Type Provider Dept  06/20/22 Procedure visit [name], MD [facility]  Showing today's visits and meeting all other requirements Future Appointments Date Type Provider Dept  07/07/22 Appointment [name], MD [facility]  Showing future appointments within next 90 days and meeting all other requirements  Disposition: Discharge home  Discharge (Date  Time): 06/20/2022; 1114 hrs.   Primary Care Physician: [name], MD Location: [facility] Note by: [name], MD (TTS technology used. I apologize for any typographical errors that were not detected and corrected.) Date: 06/20/2022; Time: 11:23 AM  Disclaimer:  Medicine is not an Chief Strategy Officer. The only guarantee in medicine is that nothing is guaranteed. It is important to note that the decision to proceed with this intervention was based on the information collected from the patient. The Data and conclusions were drawn from the patient's questionnaire, the interview, and the physical examination. Because the information was provided  in large part by the patient, it cannot be guaranteed that it has not been purposely or unconsciously manipulated. Every effort has been made to obtain as much relevant data as possible for this evaluation. It is important to note that the conclusions that lead to this procedure are derived in large part from the available data. Always take into account that the treatment will also be dependent on availability of resources and existing treatment  guidelines, considered by other Pain Management Practitioners as being common knowledge and practice, at the time of the intervention. For Medico-Legal purposes, it is also important to point out that variation in procedural techniques and pharmacological choices are the acceptable norm. The indications, contraindications, technique, and results of the above procedure should only be interpreted and judged by a Board-Certified Interventional Pain Specialist with extensive familiarity and expertise in the same exact procedure and technique.

## 2022-06-20 NOTE — Progress Notes (Signed)
Safety precautions to be maintained throughout the outpatient stay will include: orient to surroundings, keep bed in low position, maintain call bell within reach at all times, provide assistance with transfer out of bed and ambulation.  

## 2022-06-21 ENCOUNTER — Telehealth: Payer: Self-pay | Admitting: *Deleted

## 2022-06-21 NOTE — Telephone Encounter (Signed)
Attempted to call for post procedure follow-up. Message left. 

## 2022-06-22 NOTE — Progress Notes (Signed)
Remote ICD transmission.   

## 2022-07-05 ENCOUNTER — Telehealth: Payer: Self-pay | Admitting: Internal Medicine

## 2022-07-05 NOTE — Telephone Encounter (Signed)
Pt daughter Toniann Fail called in asking to speak Shanda Bumps regarding a referral that they need for pain management bump. Any questions, she's available [phone].

## 2022-07-06 ENCOUNTER — Telehealth: Payer: Self-pay

## 2022-07-06 ENCOUNTER — Encounter: Payer: Self-pay | Admitting: Student in an Organized Health Care Education/Training Program

## 2022-07-06 ENCOUNTER — Encounter: Payer: Self-pay | Admitting: Internal Medicine

## 2022-07-06 NOTE — Telephone Encounter (Signed)
Spoke with Toniann Fail and she stated that pt is wanting a referral to a different pain management doctor. Toniann Fail stated that the doctor she is under now has stated that there is nothing more he can do for her. Pt has done some research and would like to be referred to a pain management clinic and uses the pain medicine pumps. Toniann Fail stated that pt is in constant pain and has to stay in the bed all day long.

## 2022-07-06 NOTE — Telephone Encounter (Signed)
Palliative Care Note  Palliative care check in call attempted by volunteer. No answer. Will try again at a later date.  Attempt #1  Barbette Merino, RN

## 2022-07-06 NOTE — Telephone Encounter (Signed)
Pt's daughter was advised that Dr. Darrick Huntsman does not know of any doctors that places pain pumps. Daughter then asked if pt were to get a referral to new pain clinic would it stop the care of the current one and she was advised yes so she does not want the referral to be placed. I aslo advised her to ask Dr. Cherylann Ratel when she sees him tomorrow if he knows any one that places them. Daughter gave a verbal understanding.

## 2022-07-07 ENCOUNTER — Encounter: Payer: Self-pay | Admitting: Student in an Organized Health Care Education/Training Program

## 2022-07-07 ENCOUNTER — Ambulatory Visit
Payer: Medicare Other | Attending: Student in an Organized Health Care Education/Training Program | Admitting: Student in an Organized Health Care Education/Training Program

## 2022-07-07 VITALS — BP 143/104 | HR 86 | Temp 97.5°F | Resp 18 | Ht 68.0 in | Wt 115.0 lb

## 2022-07-07 DIAGNOSIS — M48062 Spinal stenosis, lumbar region with neurogenic claudication: Secondary | ICD-10-CM | POA: Insufficient documentation

## 2022-07-07 DIAGNOSIS — G894 Chronic pain syndrome: Secondary | ICD-10-CM | POA: Diagnosis not present

## 2022-07-07 DIAGNOSIS — M47816 Spondylosis without myelopathy or radiculopathy, lumbar region: Secondary | ICD-10-CM | POA: Diagnosis not present

## 2022-07-07 DIAGNOSIS — M5416 Radiculopathy, lumbar region: Secondary | ICD-10-CM | POA: Diagnosis not present

## 2022-07-07 MED ORDER — OXYCODONE-ACETAMINOPHEN 10-325 MG PO TABS
1.0000 | ORAL_TABLET | ORAL | 0 refills | Status: DC | PRN
Start: 2022-09-20 — End: 2022-10-13

## 2022-07-07 MED ORDER — OXYCODONE-ACETAMINOPHEN 10-325 MG PO TABS
1.0000 | ORAL_TABLET | ORAL | 0 refills | Status: AC | PRN
Start: 2022-07-22 — End: 2022-08-21

## 2022-07-07 MED ORDER — OXYCODONE-ACETAMINOPHEN 10-325 MG PO TABS
1.0000 | ORAL_TABLET | ORAL | 0 refills | Status: AC | PRN
Start: 2022-08-21 — End: 2022-09-20

## 2022-07-07 NOTE — Progress Notes (Signed)
PROVIDER NOTE: Information contained herein reflects review and annotations entered in association with encounter. Interpretation of such information and data should be left to medically-trained personnel. Information provided to patient can be located elsewhere in the medical record under "Patient Instructions". Document created using STT-dictation technology, any transcriptional errors that may result from process are unintentional.    Patient: [name]  Service Category: E/M  Provider: Edward Jolly, MD  DOB: [birth year]  DOS: 07/07/2022  Referring Provider: Sherlene Shams, MD  MRN: [id number]  Specialty: Interventional Pain Management  PCP: Sherlene Shams, MD  Type: Established Patient  Setting: Ambulatory outpatient    Location: Office  Delivery: Face-to-face     HPI  [name], a 82 y.o. year old female, is here today because of her Lumbar radiculopathy [M54.16]. [name] primary complain today is Back Pain  Pertinent problems: [name] has Spinal stenosis of thoracolumbar region; Spinal stenosis in cervical region; Spinal stenosis, lumbar region, with neurogenic claudication; Lumbar spondylosis; SI joint arthritis; Lumbar degenerative disc disease; Chronic pain syndrome; Lumbar radiculopathy; Lumbar facet arthropathy; and Back pain on their pertinent problem list. Pain Assessment: Severity of Chronic pain is reported as a 9 /10. Location: Back Lower, Right, Left/Radaites to buttocks bilateral. Onset: More than a month ago. Quality: Constant, Aching. Timing: Constant. Modifying factor(s): Oxycodone helps some. Vitals:  height is  (1.727 m) and weight is 115 lb (52.2 kg). Her temporal temperature is 97.5 F (36.4 C) (abnormal). Her blood pressure is 143/104 (abnormal) and her pulse is 86. Her respiration is 18 and oxygen saturation is 100%.  BMI: Estimated body mass index is 17.49 kg/m as calculated from the following:   Height as of this encounter:  (1.727 m).   Weight as  of this encounter: 115 lb (52.2 kg). Last encounter: 06/02/2022. Last procedure: 06/20/2022.  Reason for encounter: both, medication management and post-procedure evaluation and assessment.    Post-procedure evaluation   Procedure: Lumbar epidural steroid injection (LESI) (interlaminar) #1    Laterality: Midline   Level:  L4-5 Level.  Imaging: Fluoroscopic guidance         Anesthesia: Local anesthesia (1-2% Lidocaine) DOS: 06/20/2022  Performed by: Edward Jolly, MD  Purpose: Diagnostic/Therapeutic Indications: Lumbar radicular pain of intraspinal etiology of more than 4 weeks that has failed to respond to conservative therapy and is severe enough to impact quality of life or function. 1. Lumbar radiculopathy   2. Spinal stenosis, lumbar region, with neurogenic claudication   3. Chronic pain syndrome    NAS-11 Pain score:   Pre-procedure: 9 /10   Post-procedure: 8 /10      Effectiveness:  Initial hour after procedure: 100 %  Subsequent 4-6 hours post-procedure: 100 %  Analgesia past initial 6 hours: 0 % (pain returned completely 0600am the next day)  Ongoing improvement:  Analgesic:  0%    Pharmacotherapy Assessment  Analgesic: Percocet 10 mg up to 5 times a day as needed for severe breakthrough pain, quantity 150/month; MME equals 75   Monitoring: [place] PMP: PDMP reviewed during this encounter.       Pharmacotherapy: No side-effects or adverse reactions reported. Compliance: No problems identified. Effectiveness: Clinically acceptable.  Earlyne Iba, RN  07/07/2022  2:30 PM  Sign when Signing Visit Safety precautions to be maintained throughout the outpatient stay will include: orient to surroundings, keep bed in low position, maintain call bell within reach at all times, provide assistance with transfer out of bed and ambulation.  Nursing Pain Medication Assessment:  Safety precautions to be maintained throughout the outpatient stay will include: orient to surroundings,  keep bed in low position, maintain call bell within reach at all times, provide assistance with transfer out of bed and ambulation.  Medication Inspection Compliance: Pill count conducted under aseptic conditions, in front of the patient. Neither the pills nor the bottle was removed from the patient's sight at any time. Once count was completed pills were immediately returned to the patient in their original bottle.  Medication: Oxycodone/APAP Pill/Patch Count:  78 of 150 pills remain Pill/Patch Appearance: Markings consistent with prescribed medication Bottle Appearance: Standard pharmacy container. Clearly labeled. Filled Date: 04 / 04 / 2024 Last Medication intake:  Today    No results found for: "CBDTHCR" No results found for: "D8THCCBX" No results found for: "D9THCCBX"  UDS:  Summary  Date Value Ref Range Status  12/22/2021 Note  Final    Comment:    ==================================================================== [employer] [facility] (MW) ==================================================================== Specimen Alert Note: Urinary creatinine is low; ability to detect some drugs may be compromised. Interpret results with caution. (Creatinine) ==================================================================== Test                             Result       Flag       Units  Drug Present and Declared for Prescription Verification   Oxycodone                      11927        EXPECTED   ng/mg creat   Oxymorphone                    13545        EXPECTED   ng/mg creat   Noroxycodone                   15409        EXPECTED   ng/mg creat   Noroxymorphone                 3791         EXPECTED   ng/mg creat    Sources of oxycodone are scheduled prescription medications.    Oxymorphone, noroxycodone, and noroxymorphone are expected    metabolites of oxycodone. Oxymorphone is also available as a    scheduled prescription  medication.  ==================================================================== Test                      Result    Flag   Units      Ref Range   Creatinine              11        LL     mg/dL      >=65 ==================================================================== Declared Medications:  The flagging and interpretation on this report are based on the  following declared medications.  Unexpected results may arise from  inaccuracies in the declared medications.   **Note: The testing scope of this panel includes these medications:   Oxycodone (Percocet)   **Note: The testing scope of this panel does not include the  following reported medications:   Acetaminophen (Tylenol)  Acetaminophen (Percocet)  Amitriptyline (Elavil)  Amlodipine (Norvasc)  Gabapentin (Neurontin)  Losartan (Cozaar)  Menthol  Metoprolol (Toprol)  Pantoprazole (Protonix)  Polyethylene Glycol (MiraLAX)  Promethazine (Phenergan)  Sucralfate (Carafate) ==================================================================== For clinical consultation,  please call [phone]. ====================================================================       ROS  Constitutional: Denies any fever or chills Gastrointestinal: No reported hemesis, hematochezia, vomiting, or acute GI distress Musculoskeletal:  low back, left hip and left groin pain, left leg pain>right leg pain Neurological: No reported episodes of acute onset apraxia, aphasia, dysarthria, agnosia, amnesia, paralysis, loss of coordination, or loss of consciousness  Medication Review  Menthol (Topical Analgesic), acetaminophen, amLODipine, amitriptyline, celecoxib, gabapentin, losartan, metoprolol succinate, oxyCODONE-acetaminophen, pantoprazole, polyethylene glycol, promethazine, and sucralfate  History Review  Allergy: [name] has No Known Allergies. Drug: [name]  reports no history of drug use. Alcohol:  reports no history of alcohol  use. Tobacco:  reports that she has been smoking cigarettes. She has a 28.00 pack-year smoking history. She has never used smokeless tobacco. Social: [name]  reports that she has been smoking cigarettes. She has a 28.00 pack-year smoking history. She has never used smokeless tobacco. She reports that she does not drink alcohol and does not use drugs. Medical:  has a past medical history of AICD (automatic cardioverter/defibrillator) present ([place] cardiologist--- dr Graciela Husbands ), Arthritis, Bladder cancer (urologist-  dr Mena Goes), Chronic hyponatremia, Chronic low back pain with bilateral sciatica, Chronic systolic (congestive) heart failure, COPD with emphysema, Coronary artery disease (cardiologist-  dr Lorine Bears), DDD (degenerative disc disease), thoracolumbar, Frequent urination, Full dentures, Gait instability, GERD (gastroesophageal reflux disease), History of iron deficiency anemia (11/2013), History of stomach ulcers (11/2013), Hypertension, LBBB (left bundle branch block), NICM (nonischemic cardiomyopathy) (last echo 12-31-2015 ef 55-60%), Nocturia more than twice per night, S/P insertion of spinal cord stimulator (followed by [facility]---- Dr Letitia Caul (notes in care everywhere)), Scoliosis, Uses walker (02/18/2021), and Wears glasses. Surgical: [name]  has a past surgical history that includes Esophagogastroduodenoscopy (N/A, 11/28/2013); Transurethral resection of bladder tumor (N/A, 12/13/2013); Transurethral resection of bladder tumor (N/A, 01/17/2014); Cataract extraction w/ intraocular lens  implant, bilateral (Bilateral, right 12-2013 / left  02-2014); Cystoscopy with retrograde pyelogram, ureteroscopy and stent placement (Bilateral, 04/18/2014); Transurethral resection of bladder tumor (N/A, 04/18/2014); Lumbar disc surgery (1980's); bi-ventricular implantable cardioverter defibrillator (N/A, 05/07/2014); Cystoscopy w/ retrogrades (Bilateral, 01/06/2015); Cystoscopy with biopsy (N/A,  10/06/2015); Cardiac catheterization (03-03-2014  dr Lorine Bears   [facility]); Transurethral resection of bladder (1995); Cardiovascular stress test (11/25/2013); transthoracic echocardiogram (12/31/2015   dr Graciela Husbands); Cystoscopy with fulgeration (N/A, 06/30/2017); Spinal cord stimulator implant (10-09-2018   ); Cystoscopy with biopsy (N/A, 02/12/2019); Transurethral resection of bladder tumor (N/A, 12/13/2019); Transurethral resection of bladder tumor (N/A, 10/06/2020); colonscopy (11/2020); Cystoscopy with biopsy (N/A, 03/19/2021); ICD GENERATOR CHANGEOUT (N/A, 11/04/2021); Biv pacemaker generator change out; and Transurethral resection of bladder tumor (N/A, 11/30/2021). Family: family history includes Cancer in her brother and mother.  Laboratory Chemistry Profile   Renal Lab Results  Component Value Date   BUN 11 02/21/2022   CREATININE 0.68 02/21/2022   BCR 18 04/28/2014   GFR 81.41 02/16/2022   GFRAA >60 06/17/2019   GFRNONAA >60 02/21/2022    Hepatic Lab Results  Component Value Date   AST 38 02/21/2022   ALT 27 02/21/2022   ALBUMIN 3.8 02/21/2022   ALKPHOS 88 02/21/2022   LIPASE 30 11/23/2013    Electrolytes Lab Results  Component Value Date   NA 130 (L) 02/21/2022   K 4.9 02/21/2022   CL 95 (L) 02/21/2022   CALCIUM 9.4 02/21/2022   MG 2.3 06/03/2019    Bone Lab Results  Component Value Date   VD25OH 36.17 01/11/2019  Inflammation (CRP: Acute Phase) (ESR: Chronic Phase) No results found for: "CRP", "ESRSEDRATE", "LATICACIDVEN"       Note: Above Lab results reviewed.  Physical Exam  General appearance: Well nourished, well developed, and well hydrated. In no apparent acute distress Mental status: Alert, oriented x 3 (person, place, & time)       Respiratory: No evidence of acute respiratory distress Eyes: PERLA Vitals: BP (!) 143/104   Pulse 86   Temp (!) 97.5 F (36.4 C) (Temporal)   Resp 18   Ht 5\' 8"  (1.727 m)   Wt 115 lb (52.2 kg)   SpO2 100%   BMI  17.49 kg/m  BMI: Estimated body mass index is 17.49 kg/m as calculated from the following:   Height as of this encounter: 5\' 8"  (1.727 m).   Weight as of this encounter: 115 lb (52.2 kg). Ideal: Ideal body weight: 63.9 kg (140 lb 14 oz)  Lumbar Exam  Skin & Axial Inspection: Well healed scar from previous spine surgery detected, IPG for SCS in place Alignment: Symmetrical Functional ROM: Pain restricted ROM       Stability: No instability detected Muscle Tone/Strength: Functionally intact. No obvious neuro-muscular anomalies detected. Sensory (Neurological): Dermatomal pain pattern Palpation: No palpable anomalies           Gait & Posture Assessment  Ambulation: Patient ambulates using a walker Gait: Limited. Using assistive device to ambulate Posture: Difficulty standing up straight, due to pain    Lower Extremity Exam      Side: Right lower extremity   Side: Left lower extremity  Stability: No instability observed           Stability: No instability observed          Skin & Extremity Inspection: Skin color, temperature, and hair growth are WNL. No peripheral edema or cyanosis. No masses, redness, swelling, asymmetry, or associated skin lesions. No contractures.   Skin & Extremity Inspection: Skin color, temperature, and hair growth are WNL. No peripheral edema or cyanosis. No masses, redness, swelling, asymmetry, or associated skin lesions. No contractures.  Functional ROM: Pain restricted ROM for all joints of the lower extremity           Functional ROM: Pain restricted ROM for all joints of the lower extremity          Muscle Tone/Strength: Functionally intact. No obvious neuro-muscular anomalies detected.   Muscle Tone/Strength: Functionally intact. No obvious neuro-muscular anomalies detected.  Sensory (Neurological): Dermatomal pain pattern         Sensory (Neurological): Dermatomal pain pattern        DTR: Patellar: deferred today Achilles: deferred today Plantar: deferred  today   DTR: Patellar: deferred today Achilles: deferred today Plantar: deferred today  Palpation: No palpable anomalies   Palpation: No palpable anomalies    Assessment   Diagnosis Status  1. Lumbar radiculopathy   2. Spinal stenosis, lumbar region, with neurogenic claudication   3. Lumbar facet arthropathy   4. Lumbar spondylosis   5. Chronic pain syndrome    Persistent Persistent Persistent   Updated Problems: No problems updated.  Plan of Care  Problem-specific:  No problem-specific Assessment & Plan notes found for this encounter.  [name] has a current medication list which includes the following long-term medication(s): amitriptyline, amlodipine, gabapentin, losartan, metoprolol succinate, pantoprazole, and sucralfate.  Pharmacotherapy (Medications Ordered): Meds ordered this encounter  Medications   oxyCODONE-acetaminophen (PERCOCET) 10-325 MG tablet    Sig: Take 1 tablet  by mouth every 4 (four) hours as needed for pain.    Dispense:  150 tablet    Refill:  0   oxyCODONE-acetaminophen (PERCOCET) 10-325 MG tablet    Sig: Take 1 tablet by mouth every 4 (four) hours as needed for pain.    Dispense:  150 tablet    Refill:  0   oxyCODONE-acetaminophen (PERCOCET) 10-325 MG tablet    Sig: Take 1 tablet by mouth every 4 (four) hours as needed for pain.    Dispense:  150 tablet    Refill:  0  Patient's daughter accompanied her today during the visit.  I spent over 30 minutes discussing opioid-induced hyperalgesia as well as central sensitization.  I discussed a wean plan for a drug holiday.  I also discussed intrathecal pain pump and how I would not recommend that for her condition at this time.  Continue multimodal analgesics with amitriptyline, gabapentin as well as acetaminophen.  Follow-up plan:   Return in about 3 months (around 10/18/2022) for Medication Management, in person.      Recent Visits Date Type Provider Dept  06/20/22 Procedure visit  Edward Jolly, MD [facility]  06/02/22 Office Visit Edward Jolly, MD [facility]  04/14/22 Office Visit Edward Jolly, MD [facility]  Showing recent visits within past 90 days and meeting all other requirements Today's Visits Date Type Provider Dept  07/07/22 Office Visit Edward Jolly, MD [facility]  Showing today's visits and meeting all other requirements Future Appointments No visits were found meeting these conditions. Showing future appointments within next 90 days and meeting all other requirements  I discussed the assessment and treatment plan with the patient. The patient was provided an opportunity to ask questions and all were answered. The patient agreed with the plan and demonstrated an understanding of the instructions.  Patient advised to call back or seek an in-person evaluation if the symptoms or condition worsens.  Duration of encounter: .  Total time on encounter, as per AMA guidelines included both the face-to-face and non-face-to-face time personally spent by the physician and/or other qualified health care professional(s) on the day of the encounter (includes time in activities that require the physician or other qualified health care professional and does not include time in activities normally performed by clinical staff). Physician's time may include the following activities when performed: Preparing to see the patient (e.g., pre-charting review of records, searching for previously ordered imaging, lab work, and nerve conduction tests) Review of prior analgesic pharmacotherapies. Reviewing PMP Interpreting ordered tests (e.g., lab work, imaging, nerve conduction tests) Performing post-procedure evaluations, including interpretation of diagnostic procedures Obtaining and/or reviewing separately obtained history Performing a medically appropriate examination and/or evaluation Counseling and educating the  patient/family/caregiver Ordering medications, tests, or procedures Referring and communicating with other health care professionals (when not separately reported) Documenting clinical information in the electronic or other health record Independently interpreting results (not separately reported) and communicating results to the patient/ family/caregiver Care coordination (not separately reported)  Note by: Edward Jolly, MD Date: 07/07/2022; Time: 3:44 PM

## 2022-07-07 NOTE — Telephone Encounter (Signed)
Pt daughter Toniann Fail called requesting the cma to call her because she left a mychart message in the pt chart

## 2022-07-07 NOTE — Progress Notes (Signed)
Safety precautions to be maintained throughout the outpatient stay will include: orient to surroundings, keep bed in low position, maintain call bell within reach at all times, provide assistance with transfer out of bed and ambulation.   Nursing Pain Medication Assessment:  Safety precautions to be maintained throughout the outpatient stay will include: orient to surroundings, keep bed in low position, maintain call bell within reach at all times, provide assistance with transfer out of bed and ambulation.  Medication Inspection Compliance: Pill count conducted under aseptic conditions, in front of the patient. Neither the pills nor the bottle was removed from the patient's sight at any time. Once count was completed pills were immediately returned to the patient in their original bottle.  Medication: Oxycodone/APAP Pill/Patch Count:  78 of 150 pills remain Pill/Patch Appearance: Markings consistent with prescribed medication Bottle Appearance: Standard pharmacy container. Clearly labeled. Filled Date: 04 / 04 / 2024 Last Medication intake:  Today

## 2022-07-14 ENCOUNTER — Encounter: Payer: Self-pay | Admitting: Internal Medicine

## 2022-07-14 ENCOUNTER — Ambulatory Visit: Payer: Medicare Other | Attending: Internal Medicine | Admitting: Internal Medicine

## 2022-07-14 VITALS — BP 146/90 | HR 84 | Ht 68.0 in | Wt 116.2 lb

## 2022-07-14 DIAGNOSIS — I447 Left bundle-branch block, unspecified: Secondary | ICD-10-CM

## 2022-07-14 DIAGNOSIS — D649 Anemia, unspecified: Secondary | ICD-10-CM

## 2022-07-14 DIAGNOSIS — Z95810 Presence of automatic (implantable) cardiac defibrillator: Secondary | ICD-10-CM

## 2022-07-14 DIAGNOSIS — I428 Other cardiomyopathies: Secondary | ICD-10-CM | POA: Diagnosis not present

## 2022-07-14 DIAGNOSIS — R001 Bradycardia, unspecified: Secondary | ICD-10-CM

## 2022-07-14 DIAGNOSIS — I493 Ventricular premature depolarization: Secondary | ICD-10-CM | POA: Diagnosis not present

## 2022-07-14 DIAGNOSIS — I5022 Chronic systolic (congestive) heart failure: Secondary | ICD-10-CM

## 2022-07-14 NOTE — Progress Notes (Signed)
Electrophysiology Office Note   Date:  07/14/2022   ID:  Bianca, Anderson 12/25/1940, MRN [id number]  PCP:  Sherlene Shams, MD  Cardiologist:  MA Primary Electrophysiologist:  Sherryl Manges, MD    No chief complaint on file.    History of Present Illness: [name] is a 82 y.o. female seen in followup  for CRT-D implanted 2/16; genchange  8/23  Nonischemic cardiomyopathy with ejection fraction 25% and left bundle branch block.  She also had tracings in the hospital with the operating QRS in lead V1 suggesting alternating bundle branch block; however those times, the limb leads still demonstrated left bundle branch block  The patient denies chest pain, shortness of breath, nocturnal dyspnea, orthopnea or peripheral edema.  There have been no palpitations, lightheadedness or syncope.  Complains of unrelenting back pain, not responsive to her narcotics.   Walks with walker    DATE TEST EF   9/15 Myoview 28%   1/16 Cath   % CA min disease  10/17 Echo   55-65 %   6/20 Echo  60-65%     Date Cr K Hgb  7/17 0.55 4.1    1/19 0.73 4.9    3/20 0.8 4.1 12.5  11/20 0.5 4.2 14.3  12/22 0.53 4.9 13.5  12/23 0.68 4.9 10.7    At [facility] implantation of a neurostimulator for back pain assoc with new pains ( [facility] note 03/08/19) but back pain is better; she remains on narcotics with inadequate relief.    Past Medical History:  Diagnosis Date   AICD (automatic cardioverter/defibrillator) present EP cardiologist--- dr Graciela Husbands    placement 05-07-2014 , ef 25%,  NICM/  (02-11-2019 last echo 08-31-2018 ef 60-65%)   Arthritis    "in about all my joints; for sure in my back"   Bladder cancer urologist-  dr Mena Goes   dx 1995--  recurrent bladder cancer 2015 , s/p TURBT's and chemo instillation's ;   04/ 2019  s/p TURBT   Chronic hyponatremia    Chronic low back pain with bilateral sciatica    s/p  spinal cord stimulator @ [facility]  10-09-2018   Chronic systolic (congestive) heart failure     cardiologist-  dr Jerolyn Center   COPD with emphysema    (02-11-2019  per pt has never been on oxygen)   Coronary artery disease cardiologist-  dr Lorine Bears   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% per cardiac cath 03-03-2014   DDD (degenerative disc disease), thoracolumbar    Frequent urination    Full dentures    Gait instability    due to chronic low back pain, uses roller walker   GERD (gastroesophageal reflux disease)    History of iron deficiency anemia 11/2013   resolved w/ IV Iron  (02-11-2019  per pt has not had any issues since 2015)   History of stomach ulcers 11/2013   Hypertension    LBBB (left bundle branch block)    NICM (nonischemic cardiomyopathy) last echo 12-31-2015 ef 55-60%   dx 09/ 2015 per echo 20%;  myoview 09/ 2015 ef 28%;  per cardiac cath 12/ 2015 ef 30%;     Nocturia more than twice per night    S/P insertion of spinal cord stimulator followed by [facility]---- Dr Letitia Caul (notes in care everywhere)   10-09-2018  --- thoracic spinal cord stimular/ generator  (device from [facility])---- per pt has a control   Scoliosis    Uses walker 02/18/2021  all the time   Wears glasses    for reading   Past Surgical History:  Procedure Laterality Date   BI-VENTRICULAR IMPLANTABLE CARDIOVERTER DEFIBRILLATOR N/A 05/07/2014   Procedure: BI-VENTRICULAR IMPLANTABLE CARDIOVERTER DEFIBRILLATOR  (CRT-D);  Surgeon: [facility] Salvia, MD;  Location: [facility] CATH LAB;  Service: Cardiovascular;  Laterality: N/A;   BIV PACEMAKER GENERATOR CHANGE OUT     CARDIAC CATHETERIZATION  03-03-2014  dr Lorine Bears   [facility]   pLAD 20%, pRCA 20%, dRCA 50%, RPLS 50%;  ef 30%, mild elevated LVEDP, mild gradient across aortic valve LVOT   CARDIOVASCULAR STRESS TEST  11/25/2013   High risk nuclear study w/ large high severity inferior wall perfusion defect on stress and rest images, large mild severity anteroseptal wall perfusion defect on stress and rest images, No inducible ischemia/ global moderate  hypokinesis, ef 28%   CATARACT EXTRACTION W/ INTRAOCULAR LENS  IMPLANT, BILATERAL Bilateral right 12-2013 / left  02-2014   colonscopy  11/2020   benign polyps removed   CYSTOSCOPY W/ RETROGRADES Bilateral 01/06/2015   Procedure: CYSTOSCOPY WITH  BLADDER BIOPSY BILATERAL RETROGRADE PYELOGRAM,INSTILLATION OF MITOMYCIN C;  Surgeon: Jerilee Field, MD;  Location: [facility];  Service: Urology;  Laterality: Bilateral;   CYSTOSCOPY WITH BIOPSY N/A 10/06/2015   Procedure: CYSTO WITH BLADDER BIOPSY, FULGERATION, CHEMO IRRIGATION EPIRUBICIN IN PACU;  Surgeon: Jerilee Field, MD;  Location: [facility];  Service: Urology;  Laterality: N/A;   CYSTOSCOPY WITH BIOPSY N/A 02/12/2019   Procedure: CYSTOSCOPY WITH BIOPSY/ FULGURATION/ INSTILLATION OF GEMCITABINE, bilateral retrograde turbt greater 5cm;  Surgeon: Jerilee Field, MD;  Location: [facility];  Service: Urology;  Laterality: N/A;   CYSTOSCOPY WITH BIOPSY N/A 03/19/2021   Procedure: CYSTOSCOPY WITH TRANSURETHRAL RESECTION OF BLADDER TUMOR  GREATER THAN 5CM WITH POST-OPERATIVE INSTILLATION OF EPIRUBICIN;  Surgeon: Jerilee Field, MD;  Location: [facility];  Service: Urology;  Laterality: N/A;   CYSTOSCOPY WITH FULGERATION N/A 06/30/2017   Procedure: Velda Shell CYSTOSCOPY WITH [place] FULGERATION/ BLADDER BIOPSY/ INSTILLATION OF EPIRUBICIN;  Surgeon: Jerilee Field, MD;  Location: [facility];  Service: Urology;  Laterality: N/A;   CYSTOSCOPY WITH RETROGRADE PYELOGRAM, URETEROSCOPY AND STENT PLACEMENT Bilateral 04/18/2014   Procedure: CYSTOSCOPY WITH RETROGRADE PYELOGRAM;  Surgeon: Jerilee Field, MD;  Location: [facility];  Service: Urology;  Laterality: Bilateral;   ESOPHAGOGASTRODUODENOSCOPY N/A 11/28/2013   Procedure: ESOPHAGOGASTRODUODENOSCOPY (EGD);  Surgeon: Willis Modena, MD;  Location: [facility] ENDOSCOPY;  Service: Endoscopy;  Laterality: N/A;   ICD GENERATOR CHANGEOUT N/A 11/04/2021   Procedure: ICD GENERATOR CHANGEOUT;   Surgeon: [facility] Salvia, MD;  Location: [facility] INVASIVE CV LAB;  Service: Cardiovascular;  Laterality: N/A;   LUMBAR DISC SURGERY  1980's   "ruptured disc"   SPINAL CORD STIMULATOR IMPLANT  10-09-2018   @[facility]    Thoracic spinal cord stimular/ genertor  (left flank)----- (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[employer])   TRANSTHORACIC ECHOCARDIOGRAM  12/31/2015   dr Graciela Husbands   ef 55-605, grade 1 diastolic dysfunction/ mild MR/ septal motion showed abnormal function and dyssynergy   TRANSURETHRAL RESECTION OF BLADDER  1995   TRANSURETHRAL RESECTION OF BLADDER TUMOR N/A 12/13/2013   Procedure: TRANSURETHRAL RESECTION OF BLADDER TUMOR (TURBT);  Surgeon: Jerilee Field, MD;  Location: [facility];  Service: Urology;  Laterality: N/A;   TRANSURETHRAL RESECTION OF BLADDER TUMOR N/A 01/17/2014   Procedure: TRANSURETHRAL RESECTION OF BLADDER TUMOR (TURBT);  Surgeon: Jerilee Field, MD;  Location: [facility];  Service: Urology;  Laterality: N/A;   TRANSURETHRAL RESECTION OF BLADDER TUMOR N/A 04/18/2014  Procedure: TRANSURETHRAL RESECTION OF BLADDER TUMOR (TURBT), CYSTOGRAM;  Surgeon: Jerilee Field, MD;  Location: [facility];  Service: Urology;  Laterality: N/A;   TRANSURETHRAL RESECTION OF BLADDER TUMOR N/A 12/13/2019   Procedure: TRANSURETHRAL RESECTION OF BLADDER TUMOR (TURBT) GREATER THAN 5CM WITH CYSTOSCOPY/ POST OPERATIVE INSTILLATION OF GEMCITABINE;  Surgeon: Jerilee Field, MD;  Location: [facility];  Service: Urology;  Laterality: N/A;   TRANSURETHRAL RESECTION OF BLADDER TUMOR N/A 10/06/2020   Procedure: TRANSURETHRAL RESECTION OF BLADDER TUMOR (TURBT)/ CYSTOSCOPY/  BLADDER BIOPSY;  Surgeon: Jerilee Field, MD;  Location: [facility];  Service: Urology;  Laterality: N/A;   TRANSURETHRAL RESECTION OF BLADDER TUMOR N/A 11/30/2021   Procedure: TRANSURETHRAL RESECTION OF BLADDER TUMOR (TURBT) WITH POST OPERATIVE INSTILLATION OF GEMCITABINE;  Surgeon: Jerilee Field, MD;  Location: [facility];  Service: Urology;   Laterality: N/A;     Current Meds  Medication Sig   acetaminophen (TYLENOL) 500 MG tablet Take 1,000 mg by mouth every 8 (eight) hours as needed for moderate pain.   amitriptyline (ELAVIL) 50 MG tablet TAKE 1 TABLET(50 MG) BY MOUTH AT BEDTIME   amLODipine (NORVASC) 5 MG tablet Take 1 tablet (5 mg total) by mouth daily.   celecoxib (CELEBREX) 200 MG capsule Take 1 capsule (200 mg total) by mouth 2 (two) times daily.   gabapentin (NEURONTIN) 300 MG capsule TAKE 2 CAPSULES(600 MG) BY MOUTH THREE TIMES DAILY   losartan (COZAAR) 100 MG tablet Take 1 tablet (100 mg total) by mouth at bedtime.   Menthol, Topical Analgesic, (BIOFREEZE EX) Apply 1 application topically 4 (four) times daily as needed (back pain).   metoprolol succinate (TOPROL-XL) 25 MG 24 hr tablet TAKE 1 TABLET(25 MG) BY MOUTH DAILY   [START ON 07/22/2022] oxyCODONE-acetaminophen (PERCOCET) 10-325 MG tablet Take 1 tablet by mouth every 4 (four) hours as needed for pain.   [START ON 08/21/2022] oxyCODONE-acetaminophen (PERCOCET) 10-325 MG tablet Take 1 tablet by mouth every 4 (four) hours as needed for pain.   [START ON 09/20/2022] oxyCODONE-acetaminophen (PERCOCET) 10-325 MG tablet Take 1 tablet by mouth every 4 (four) hours as needed for pain.   pantoprazole (PROTONIX) 40 MG tablet Take 1 tablet (40 mg total) by mouth at bedtime.   polyethylene glycol (MIRALAX / GLYCOLAX) packet Take 17 g by mouth daily as needed for mild constipation.    promethazine (PHENERGAN) 12.5 MG tablet Take by mouth.   sucralfate (CARAFATE) 1 g tablet TAKE 1 TABLET BY MOUTH ONE HOUR BEFORE EACH MEAL AND TWO HOURS AFTER OTHER MEDICATIONS (Patient taking differently: Take 1 g by mouth See admin instructions. Take 1 tablet one hour before each meal and 2 hours after all other medications)      Allergies:   Patient has no known allergies.       ROS:  Please see the history of present illness.  .   All other systems are reviewed and negative.    PHYSICAL  EXAM: VS:  BP (!) 146/90   Pulse 84   Ht  (1.727 m)   Wt 116 lb 3.2 oz (52.7 kg)   SpO2 94%   BMI 17.67 kg/m  , BMI Body mass index is 17.67 kg/m. Well developed and well nourished in no acute distress HENT normal Neck supple with JVP-flat Clear Device pocket well healed; without hematoma or erythema.  There is no tethering  Regular rate and rhythm, no  gallop N murmur Abd-soft with active BS No Clubbing cyanosis  edema Skin-warm and dry A &  Oriented  Grossly normal sensory and motor function tearful  ECG sinus w P-synchronous/ AV  pacing  Upright QRS lead V1 and neg lead 1  Device function is normal. Programming changes  See Paceart for details       ASSESSMENT AND PLAN:  Cardiomyopathy nonischemic  Congestive heart failure-chronic systolic  COPD/emphysema  Hypertension  Left bundle branch block  PVCs-infrequent  Sinus bradycardia-borderline  CRT-D [employer]    T wave oversensing resulting in P wave being in refractory and thus not tracked  Gait instability    Euvolemic.  Blood pressure markedly elevated.  Likely in part related to pain.  Continue losartan 100, amlodipine 5 and metoprolol 25.  Most of our discussion related to the severity of the back pain and "I cannot take it much more "did not in this context address end-of-life i.e. defibrillator function will have to keep this in mind.  Lengthy discussion trying to encourage her with the pain and its attendant suffering and devitalization    Signed, Sherryl Manges, MD  07/14/2022 12:31 PM     [facility] [address] [place] [address] [phone] (office) [phone] (fax)

## 2022-07-14 NOTE — Patient Instructions (Signed)
Medication Instructions:  Your physician recommends that you continue on your current medications as directed. Please refer to the Current Medication list given to you today.  *If you need a refill on your cardiac medications before your next appointment, please call your pharmacy*   Lab Work: CBC, Ferritin,TIBC and Iron Panel today If you have labs (blood work) drawn today and your tests are completely normal, you will receive your results only by: MyChart Message (if you have MyChart) OR A paper copy in the mail If you have any lab test that is abnormal or we need to change your treatment, we will call you to review the results.   Testing/Procedures: None ordered.    Follow-Up: At [facility], you and your health needs are our priority.  As part of our continuing mission to provide you with exceptional heart care, we have created designated Provider Care Teams.  These Care Teams include your primary Cardiologist (physician) and Advanced Practice Providers (APPs -  Physician Assistants and Nurse Practitioners) who all work together to provide you with the care you need, when you need it.  We recommend signing up for the patient portal called "MyChart".  Sign up information is provided on this After Visit Summary.  MyChart is used to connect with patients for Virtual Visits (Telemedicine).  Patients are able to view lab/test results, encounter notes, upcoming appointments, etc.  Non-urgent messages can be sent to your provider as well.   To learn more about what you can do with MyChart, go to [email or web address].    Your next appointment:   9 months with Dr Graciela Husbands

## 2022-07-20 ENCOUNTER — Other Ambulatory Visit: Payer: Self-pay | Admitting: Internal Medicine

## 2022-07-20 ENCOUNTER — Telehealth: Payer: Self-pay | Admitting: Student in an Organized Health Care Education/Training Program

## 2022-07-20 DIAGNOSIS — D649 Anemia, unspecified: Secondary | ICD-10-CM | POA: Diagnosis not present

## 2022-07-20 NOTE — Telephone Encounter (Signed)
Patients son called asking about an evaluation for a pain med pump. They want to know if she can do a trial and have one put in. Please pass on to Dr Cherylann Ratel so he can discuss with DR Laban Emperor.

## 2022-07-21 LAB — IRON,TIBC AND FERRITIN PANEL
Ferritin: 53 ng/mL (ref 15–150)
Iron Saturation: 15 % (ref 15–55)
Iron: 62 ug/dL (ref 27–139)
Total Iron Binding Capacity: 412 ug/dL (ref 250–450)
UIBC: 350 ug/dL (ref 118–369)

## 2022-07-21 LAB — CBC
Hematocrit: 42.1 % (ref 34.0–46.6)
Hemoglobin: 14.2 g/dL (ref 11.1–15.9)
MCH: 30.3 pg (ref 26.6–33.0)
MCHC: 33.7 g/dL (ref 31.5–35.7)
MCV: 90 fL (ref 79–97)
Platelets: 218 10*3/uL (ref 150–450)
RBC: 4.68 x10E6/uL (ref 3.77–5.28)
RDW: 13.1 % (ref 11.7–15.4)
WBC: 5.7 10*3/uL (ref 3.4–10.8)

## 2022-07-22 ENCOUNTER — Telehealth: Payer: Self-pay

## 2022-07-22 NOTE — Telephone Encounter (Signed)
1610- Palliative Care Note  On 07/12/22, volunteer attempted to contact pt for palliative check in. Both home phone and mobile went to voicemail.  This is attempt #2  Barbette Merino, RN

## 2022-07-29 ENCOUNTER — Telehealth: Payer: Self-pay

## 2022-07-29 NOTE — Telephone Encounter (Signed)
[id number] Palliative Care Note  Volunteer attempted check in call on 07/26/22. No answer. LVM with contact information.  This is attempt #3  Barbette Merino, RN

## 2022-08-01 ENCOUNTER — Telehealth: Payer: Self-pay | Admitting: Internal Medicine

## 2022-08-01 NOTE — Telephone Encounter (Signed)
Pt called for her lab results, explained to the patient Dr. Graciela Husbands has not reviewed the test results. Once Dr. Graciela Husbands has reviewed  the results the office will call the patient. Patient voiced understanding.

## 2022-08-01 NOTE — Telephone Encounter (Signed)
Patient is calling to follow up on lab results.

## 2022-08-02 ENCOUNTER — Encounter: Payer: Self-pay | Admitting: Internal Medicine

## 2022-08-04 ENCOUNTER — Other Ambulatory Visit: Payer: Self-pay | Admitting: Internal Medicine

## 2022-08-08 ENCOUNTER — Ambulatory Visit (INDEPENDENT_AMBULATORY_CARE_PROVIDER_SITE_OTHER): Payer: Medicare Other

## 2022-08-08 DIAGNOSIS — I5022 Chronic systolic (congestive) heart failure: Secondary | ICD-10-CM

## 2022-08-08 DIAGNOSIS — I428 Other cardiomyopathies: Secondary | ICD-10-CM | POA: Diagnosis not present

## 2022-08-08 LAB — CUP PACEART REMOTE DEVICE CHECK
Battery Remaining Longevity: 82 mo
Battery Voltage: 2.99 V
Brady Statistic AP VP Percent: 32.51 %
Brady Statistic AP VS Percent: 0.52 %
Brady Statistic AS VP Percent: 65.45 %
Brady Statistic AS VS Percent: 1.52 %
Brady Statistic RA Percent Paced: 32.46 %
Brady Statistic RV Percent Paced: 96.06 %
Date Time Interrogation Session: 20240520033522
HighPow Impedance: 73 Ohm
Implantable Lead Connection Status: 753985
Implantable Lead Connection Status: 753985
Implantable Lead Connection Status: 753985
Implantable Lead Implant Date: 20160217
Implantable Lead Implant Date: 20160217
Implantable Lead Implant Date: 20160217
Implantable Lead Location: 753858
Implantable Lead Location: 753859
Implantable Lead Location: 753860
Implantable Lead Model: 4298
Implantable Lead Model: 5076
Implantable Pulse Generator Implant Date: 20230817
Lead Channel Impedance Value: 218.5 Ohm
Lead Channel Impedance Value: 223.149
Lead Channel Impedance Value: 247.358
Lead Channel Impedance Value: 247.358
Lead Channel Impedance Value: 253.333
Lead Channel Impedance Value: 399 Ohm
Lead Channel Impedance Value: 399 Ohm
Lead Channel Impedance Value: 437 Ohm
Lead Channel Impedance Value: 437 Ohm
Lead Channel Impedance Value: 456 Ohm
Lead Channel Impedance Value: 513 Ohm
Lead Channel Impedance Value: 570 Ohm
Lead Channel Impedance Value: 570 Ohm
Lead Channel Impedance Value: 779 Ohm
Lead Channel Impedance Value: 779 Ohm
Lead Channel Impedance Value: 893 Ohm
Lead Channel Impedance Value: 912 Ohm
Lead Channel Impedance Value: 912 Ohm
Lead Channel Pacing Threshold Amplitude: 0.5 V
Lead Channel Pacing Threshold Amplitude: 0.875 V
Lead Channel Pacing Threshold Amplitude: 1.375 V
Lead Channel Pacing Threshold Pulse Width: 0.4 ms
Lead Channel Pacing Threshold Pulse Width: 0.4 ms
Lead Channel Pacing Threshold Pulse Width: 0.4 ms
Lead Channel Sensing Intrinsic Amplitude: 24.875 mV
Lead Channel Sensing Intrinsic Amplitude: 24.875 mV
Lead Channel Sensing Intrinsic Amplitude: 3.25 mV
Lead Channel Sensing Intrinsic Amplitude: 3.25 mV
Lead Channel Setting Pacing Amplitude: 1.5 V
Lead Channel Setting Pacing Amplitude: 2 V
Lead Channel Setting Pacing Amplitude: 2 V
Lead Channel Setting Pacing Pulse Width: 0.4 ms
Lead Channel Setting Pacing Pulse Width: 0.4 ms
Lead Channel Setting Sensing Sensitivity: 0.45 mV
Zone Setting Status: 755011
Zone Setting Status: 755011

## 2022-08-09 ENCOUNTER — Encounter: Payer: Medicare Other | Admitting: Internal Medicine

## 2022-09-01 ENCOUNTER — Other Ambulatory Visit: Payer: Self-pay | Admitting: Internal Medicine

## 2022-09-01 DIAGNOSIS — M5416 Radiculopathy, lumbar region: Secondary | ICD-10-CM

## 2022-09-01 DIAGNOSIS — G894 Chronic pain syndrome: Secondary | ICD-10-CM

## 2022-09-01 DIAGNOSIS — M47816 Spondylosis without myelopathy or radiculopathy, lumbar region: Secondary | ICD-10-CM

## 2022-09-05 NOTE — Progress Notes (Signed)
Remote ICD transmission.   

## 2022-09-06 ENCOUNTER — Other Ambulatory Visit: Payer: Self-pay

## 2022-09-07 ENCOUNTER — Other Ambulatory Visit: Payer: Self-pay | Admitting: Internal Medicine

## 2022-09-07 DIAGNOSIS — Z8551 Personal history of malignant neoplasm of bladder: Secondary | ICD-10-CM | POA: Diagnosis not present

## 2022-09-13 ENCOUNTER — Ambulatory Visit (INDEPENDENT_AMBULATORY_CARE_PROVIDER_SITE_OTHER): Payer: Medicare Other

## 2022-09-13 VITALS — Ht 68.0 in | Wt 116.0 lb

## 2022-09-13 DIAGNOSIS — Z122 Encounter for screening for malignant neoplasm of respiratory organs: Secondary | ICD-10-CM | POA: Diagnosis not present

## 2022-09-13 DIAGNOSIS — Z0000 Encounter for general adult medical examination without abnormal findings: Secondary | ICD-10-CM | POA: Diagnosis not present

## 2022-09-13 NOTE — Patient Instructions (Signed)
[name] , Thank you for taking time to come for your Medicare Wellness Visit. I appreciate your ongoing commitment to your health goals. Please review the following plan we discussed and let me know if I can assist you in the future.   These are the goals we discussed:  Goals       Healthy Lifestyle (pt-stated)      Stay active Healthy diet Good water intake        This is a list of the screening recommended for you and due dates:  Health Maintenance  Topic Date Due   DTaP/Tdap/Td vaccine (1 - Tdap) Never done   COVID-19 Vaccine (3 - Moderna risk series) 12/16/2019   Flu Shot  10/20/2022   Medicare Annual Wellness Visit  09/13/2023   Pneumonia Vaccine  Completed   DEXA scan (bone density measurement)  Completed   Zoster (Shingles) Vaccine  Completed   HPV Vaccine  Aged Out    Health Maintenance After Age 62 After age 39, you are at a higher risk for certain long-term diseases and infections as well as injuries from falls. Falls are a major cause of broken bones and head injuries in people who are older than age 76. Getting regular preventive care can help to keep you healthy and well. Preventive care includes getting regular testing and making lifestyle changes as recommended by your health care provider. Talk with your health care provider about: Which screenings and tests you should have. A screening is a test that checks for a disease when you have no symptoms. A diet and exercise plan that is right for you. What should I know about screenings and tests to prevent falls? Screening and testing are the best ways to find a health problem early. Early diagnosis and treatment give you the best chance of managing medical conditions that are common after age 43. Certain conditions and lifestyle choices may make you more likely to have a fall. Your health care provider may recommend: Regular vision checks. Poor vision and conditions such as cataracts can make you more likely to have a  fall. If you wear glasses, make sure to get your prescription updated if your vision changes. Medicine review. Work with your health care provider to regularly review all of the medicines you are taking, including over-the-counter medicines. Ask your health care provider about any side effects that may make you more likely to have a fall. Tell your health care provider if any medicines that you take make you feel dizzy or sleepy. Strength and balance checks. Your health care provider may recommend certain tests to check your strength and balance while standing, walking, or changing positions. Foot health exam. Foot pain and numbness, as well as not wearing proper footwear, can make you more likely to have a fall. Screenings, including: Osteoporosis screening. Osteoporosis is a condition that causes the bones to get weaker and break more easily. Blood pressure screening. Blood pressure changes and medicines to control blood pressure can make you feel dizzy. Depression screening. You may be more likely to have a fall if you have a fear of falling, feel depressed, or feel unable to do activities that you used to do. Alcohol use screening. Using too much alcohol can affect your balance and may make you more likely to have a fall. Follow these instructions at home: Lifestyle Do not drink alcohol if: Your health care provider tells you not to drink. If you drink alcohol: Limit how much you have to: 0-1  drink a day for women. 0-2 drinks a day for men. Know how much alcohol is in your drink. In the [place] one drink equals one 12 oz bottle of beer (355 mL), one 5 oz glass of wine (148 mL), or one 1 oz glass of hard liquor (44 mL). Do not use any products that contain nicotine or tobacco. These products include cigarettes, chewing tobacco, and vaping devices, such as e-cigarettes. If you need help quitting, ask your health care provider. Activity  Follow a regular exercise program to stay fit. This will  help you maintain your balance. Ask your health care provider what types of exercise are appropriate for you. If you need a cane or walker, use it as recommended by your health care provider. Wear supportive shoes that have nonskid soles. Safety  Remove any tripping hazards, such as rugs, cords, and clutter. Install safety equipment such as grab bars in bathrooms and safety rails on stairs. Keep rooms and walkways well-lit. General instructions Talk with your health care provider about your risks for falling. Tell your health care provider if: You fall. Be sure to tell your health care provider about all falls, even ones that seem minor. You feel dizzy, tiredness (fatigue), or off-balance. Take over-the-counter and prescription medicines only as told by your health care provider. These include supplements. Eat a healthy diet and maintain a healthy weight. A healthy diet includes low-fat dairy products, low-fat (lean) meats, and fiber from whole grains, beans, and lots of fruits and vegetables. Stay current with your vaccines. Schedule regular health, dental, and eye exams. Summary Having a healthy lifestyle and getting preventive care can help to protect your health and wellness after age 54. Screening and testing are the best way to find a health problem early and help you avoid having a fall. Early diagnosis and treatment give you the best chance for managing medical conditions that are more common for people who are older than age 17. Falls are a major cause of broken bones and head injuries in people who are older than age 72. Take precautions to prevent a fall at home. Work with your health care provider to learn what changes you can make to improve your health and wellness and to prevent falls. This information is not intended to replace advice given to you by your health care provider. Make sure you discuss any questions you have with your health care provider. Document Revised:  07/27/2020 Document Reviewed: 07/27/2020 Elsevier Patient Education  2024 [employer].  Vaccinations: declines Tdap vaccine: recommend every 10 years  If you wish to quit smoking, help is available. For free tobacco cessation program offerings call the [facility] at 702-112-5560 or Live Well Line at 803-096-1127. You may also visit www.[email or web address] or email livelifewell@ .com for more information on other programs.   You may also call 1-800-QUIT-NOW (716-644-1434) or visit www.[place].ch or www.[email or web address] for additional resources on smoking cessation.

## 2022-09-13 NOTE — Progress Notes (Cosign Needed Addendum)
Subjective:   [name] is a 82 y.o. female who presents for Medicare Annual (Subsequent) preventive examination.  Visit Complete: Virtual  I connected with  [name] on 09/13/22 by a audio enabled telemedicine application and verified that I am speaking with the correct person using two identifiers.  Patient Location: Home  Provider Location: Office/Clinic  I discussed the limitations of evaluation and management by telemedicine. The patient expressed understanding and agreed to proceed.   Review of Systems     Cardiac Risk Factors include: hypertension;smoking/ tobacco exposure     Objective:    Today's Vitals   09/13/22 1258 09/13/22 1301  Weight: 116 lb (52.6 kg)   Height: 5\' 8"  (1.727 m)   PainSc:  10-Worst pain ever   Body mass index is 17.64 kg/m.     09/13/2022    1:19 PM 07/07/2022    2:25 PM 06/20/2022   10:47 AM 04/14/2022    1:18 PM 02/21/2022    9:12 AM 01/24/2022   10:54 AM 01/24/2022    9:48 AM  Advanced Directives  Does Patient Have a Medical Advance Directive? Yes Yes Yes Yes Yes Yes Yes  Type of Estate agent of [employer] Power of [place];Living will Healthcare Power of [place];Living will Healthcare Power of [place];Living will Healthcare Power of [place];Living will  Healthcare Power of [place];Living will  Does patient want to make changes to medical advance directive? No - Patient declined    No - Patient declined No - Patient declined No - Patient declined  Copy of Healthcare Power of Attorney in Chart?  No - copy requested  No - copy requested Yes - validated most recent copy scanned in chart (See row information)  Yes - validated most recent copy scanned in chart (See row information)    Current Medications (verified) Outpatient Encounter Medications as of 09/13/2022  Medication Sig   acetaminophen (TYLENOL) 500 MG tablet Take 1,000 mg by mouth every 8 (eight) hours as needed for moderate pain.    amitriptyline (ELAVIL) 50 MG tablet TAKE 1 TABLET(50 MG) BY MOUTH AT BEDTIME   amLODipine (NORVASC) 5 MG tablet TAKE 1 TABLET(5 MG) BY MOUTH DAILY   celecoxib (CELEBREX) 200 MG capsule TAKE 1 CAPSULE(200 MG) BY MOUTH TWICE DAILY   gabapentin (NEURONTIN) 300 MG capsule TAKE 2 CAPSULES(600 MG) BY MOUTH THREE TIMES DAILY   losartan (COZAAR) 100 MG tablet Take 1 tablet (100 mg total) by mouth at bedtime.   Menthol, Topical Analgesic, (BIOFREEZE EX) Apply 1 application topically 4 (four) times daily as needed (back pain).   metoprolol succinate (TOPROL-XL) 25 MG 24 hr tablet TAKE 1 TABLET(25 MG) BY MOUTH DAILY   oxyCODONE-acetaminophen (PERCOCET) 10-325 MG tablet Take 1 tablet by mouth every 4 (four) hours as needed for pain.   pantoprazole (PROTONIX) 40 MG tablet Take 1 tablet (40 mg total) by mouth at bedtime.   polyethylene glycol (MIRALAX / GLYCOLAX) packet Take 17 g by mouth daily as needed for mild constipation.    promethazine (PHENERGAN) 12.5 MG tablet TAKE 1 TABLET(12.5 MG) BY MOUTH EVERY 8 HOURS AS NEEDED FOR NAUSEA OR VOMITING   sucralfate (CARAFATE) 1 g tablet TAKE 1 TABLET BY MOUTH ONE HOUR BEFORE EACH MEAL AND TWO HOURS AFTER OTHER MEDICATIONS (Patient taking differently: Take 1 g by mouth See admin instructions. Take 1 tablet one hour before each meal and 2 hours after all other medications)   [START ON 09/20/2022] oxyCODONE-acetaminophen (PERCOCET) 10-325 MG tablet Take  1 tablet by mouth every 4 (four) hours as needed for pain.   Facility-Administered Encounter Medications as of 09/13/2022  Medication   epirubicin (ELLENCE) 50 mg in sodium chloride 0.9 % bladder instillation   gemcitabine (GEMZAR) chemo syringe for bladder instillation 2,000 mg   gemcitabine (GEMZAR) chemo syringe for bladder instillation 2,000 mg    Allergies (verified) Patient has no known allergies.   History: Past Medical History:  Diagnosis Date   AICD (automatic cardioverter/defibrillator) present EP  cardiologist--- dr Graciela Husbands    placement 05-07-2014 , ef 25%,  NICM/  (02-11-2019 last echo 08-31-2018 ef 60-65%)   Arthritis    "in about all my joints; for sure in my back"   Bladder cancer [facility]) urologist-  dr Mena Goes   dx 1995--  recurrent bladder cancer 2015 , s/p TURBT's and chemo instillation's ;   04/ 2019  s/p TURBT   Chronic hyponatremia    Chronic low back pain with bilateral sciatica    s/p  spinal cord stimulator @ [facility]  10-09-2018   Chronic systolic (congestive) heart failure [facility])    cardiologist-  dr Jerolyn Center   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CC)    (02-11-2019  per pt has never been on oxygen)   Coronary artery disease cardiologist-  dr Lorine Bears   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% per cardiac cath 03-03-2014   DDD (degenerative disc disease), thoracolumbar    Frequent urination    Full dentures    Gait instability    due to chronic low back pain, uses roller walker   GERD (gastroesophageal reflux disease)    History of iron deficiency anemia 11/2013   resolved w/ IV Iron  (02-11-2019  per pt has not had any issues since 2015)   History of stomach ulcers 11/2013   Hypertension    LBBB (left bundle branch block)    NICM (nonischemic cardiomyopathy) (HCC) last echo 12-31-2015 ef 55-60%   dx 09/ 2015 per echo 20%;  myoview 09/ 2015 ef 28%;  per cardiac cath 12/ 2015 ef 30%;     Nocturia more than twice per night    S/P insertion of spinal cord stimulator followed by [facility]---- Dr Letitia Caul (notes in care everywhere)   10-09-2018  @[facility] --- thoracic spinal cord stimular/ generator  (device from [facility])---- per pt has a control   Scoliosis    Uses walker 02/18/2021   all the time   Wears glasses    for reading   Past Surgical History:  Procedure Laterality Date   BI-VENTRICULAR IMPLANTABLE CARDIOVERTER DEFIBRILLATOR N/A 05/07/2014   Procedure: BI-VENTRICULAR IMPLANTABLE CARDIOVERTER DEFIBRILLATOR  (CRT-D);  Surgeon: [facility] Salvia, MD;  Location: [facility] CATH LAB;  Service:  Cardiovascular;  Laterality: N/A;   BIV PACEMAKER GENERATOR CHANGE OUT     CARDIAC CATHETERIZATION  03-03-2014  dr Lorine Bears   [facility]   pLAD 20%, pRCA 20%, dRCA 50%, RPLS 50%;  ef 30%, mild elevated LVEDP, mild gradient across aortic valve LVOT   CARDIOVASCULAR STRESS TEST  11/25/2013   High risk nuclear study w/ large high severity inferior wall perfusion defect on stress and rest images, large mild severity anteroseptal wall perfusion defect on stress and rest images, No inducible ischemia/ global moderate hypokinesis, ef 28%   CATARACT EXTRACTION W/ INTRAOCULAR LENS  IMPLANT, BILATERAL Bilateral right 12-2013 / left  02-2014   colonscopy  11/2020   benign polyps removed   CYSTOSCOPY W/ RETROGRADES Bilateral 01/06/2015   Procedure: CYSTOSCOPY WITH  BLADDER BIOPSY  BILATERAL RETROGRADE PYELOGRAM,INSTILLATION OF MITOMYCIN C;  Surgeon: Jerilee Field, MD;  Location: [facility];  Service: Urology;  Laterality: Bilateral;   CYSTOSCOPY WITH BIOPSY N/A 10/06/2015   Procedure: CYSTO WITH BLADDER BIOPSY, FULGERATION, CHEMO IRRIGATION EPIRUBICIN IN PACU;  Surgeon: Jerilee Field, MD;  Location: [facility];  Service: Urology;  Laterality: N/A;   CYSTOSCOPY WITH BIOPSY N/A 02/12/2019   Procedure: CYSTOSCOPY WITH BIOPSY/ FULGURATION/ INSTILLATION OF GEMCITABINE, bilateral retrograde turbt greater 5cm;  Surgeon: Jerilee Field, MD;  Location: [facility];  Service: Urology;  Laterality: N/A;   CYSTOSCOPY WITH BIOPSY N/A 03/19/2021   Procedure: CYSTOSCOPY WITH TRANSURETHRAL RESECTION OF BLADDER TUMOR  GREATER THAN 5CM WITH POST-OPERATIVE INSTILLATION OF EPIRUBICIN;  Surgeon: Jerilee Field, MD;  Location: [facility];  Service: Urology;  Laterality: N/A;   CYSTOSCOPY WITH FULGERATION N/A 06/30/2017   Procedure: Velda Shell CYSTOSCOPY WITH [place] FULGERATION/ BLADDER BIOPSY/ INSTILLATION OF EPIRUBICIN;  Surgeon: Jerilee Field, MD;  Location: [facility];  Service: Urology;   Laterality: N/A;   CYSTOSCOPY WITH RETROGRADE PYELOGRAM, URETEROSCOPY AND STENT PLACEMENT Bilateral 04/18/2014   Procedure: CYSTOSCOPY WITH RETROGRADE PYELOGRAM;  Surgeon: Jerilee Field, MD;  Location: [facility];  Service: Urology;  Laterality: Bilateral;   ESOPHAGOGASTRODUODENOSCOPY N/A 11/28/2013   Procedure: ESOPHAGOGASTRODUODENOSCOPY (EGD);  Surgeon: Willis Modena, MD;  Location: [facility] ENDOSCOPY;  Service: Endoscopy;  Laterality: N/A;   ICD GENERATOR CHANGEOUT N/A 11/04/2021   Procedure: ICD GENERATOR CHANGEOUT;  Surgeon: [facility] Salvia, MD;  Location: [facility] INVASIVE CV LAB;  Service: Cardiovascular;  Laterality: N/A;   LUMBAR DISC SURGERY  1980's   "ruptured disc"   SPINAL CORD STIMULATOR IMPLANT  10-09-2018   @[facility]    Thoracic spinal cord stimular/ genertor  (left flank)----- (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[employer])   TRANSTHORACIC ECHOCARDIOGRAM  12/31/2015   dr Graciela Husbands   ef 55-605, grade 1 diastolic dysfunction/ mild MR/ septal motion showed abnormal function and dyssynergy   TRANSURETHRAL RESECTION OF BLADDER  1995   TRANSURETHRAL RESECTION OF BLADDER TUMOR N/A 12/13/2013   Procedure: TRANSURETHRAL RESECTION OF BLADDER TUMOR (TURBT);  Surgeon: Jerilee Field, MD;  Location: [facility];  Service: Urology;  Laterality: N/A;   TRANSURETHRAL RESECTION OF BLADDER TUMOR N/A 01/17/2014   Procedure: TRANSURETHRAL RESECTION OF BLADDER TUMOR (TURBT);  Surgeon: Jerilee Field, MD;  Location: [facility];  Service: Urology;  Laterality: N/A;   TRANSURETHRAL RESECTION OF BLADDER TUMOR N/A 04/18/2014   Procedure: TRANSURETHRAL RESECTION OF BLADDER TUMOR (TURBT), CYSTOGRAM;  Surgeon: Jerilee Field, MD;  Location: [facility];  Service: Urology;  Laterality: N/A;   TRANSURETHRAL RESECTION OF BLADDER TUMOR N/A 12/13/2019   Procedure: TRANSURETHRAL RESECTION OF BLADDER TUMOR (TURBT) GREATER THAN 5CM WITH CYSTOSCOPY/ POST OPERATIVE INSTILLATION OF GEMCITABINE;  Surgeon: Jerilee Field, MD;  Location: [facility];   Service: Urology;  Laterality: N/A;   TRANSURETHRAL RESECTION OF BLADDER TUMOR N/A 10/06/2020   Procedure: TRANSURETHRAL RESECTION OF BLADDER TUMOR (TURBT)/ CYSTOSCOPY/  BLADDER BIOPSY;  Surgeon: Jerilee Field, MD;  Location: [facility];  Service: Urology;  Laterality: N/A;   TRANSURETHRAL RESECTION OF BLADDER TUMOR N/A 11/30/2021   Procedure: TRANSURETHRAL RESECTION OF BLADDER TUMOR (TURBT) WITH POST OPERATIVE INSTILLATION OF GEMCITABINE;  Surgeon: Jerilee Field, MD;  Location: [facility];  Service: Urology;  Laterality: N/A;   Family History  Problem Relation Age of Onset   Cancer Mother    Cancer Brother    Social History   Socioeconomic History   Marital status: Widowed    Spouse name: Not on file  Number of children: 6   Years of education: 12   Highest education level: High school graduate  Occupational History   Occupation: retired  Tobacco Use   Smoking status: Every Day    Packs/day: 0.50    Years: 56.00    Additional pack years: 0.00    Total pack years: 28.00    Types: Cigarettes   Smokeless tobacco: Never   Tobacco comments:    02-11-2019  per pt currently smokes 10 cig's per day , started smoking age 66  Vaping Use   Vaping Use: Never used  Substance and Sexual Activity   Alcohol use: No   Drug use: No   Sexual activity: Never    Birth control/protection: Post-menopausal  Other Topics Concern   Not on file  Social History Narrative   Lives at home with daughter and son in law   Social Determinants of Health   Financial Resource Strain: Low Risk  (09/13/2022)   Overall Financial Resource Strain (CARDIA)    Difficulty of Paying Living Expenses: Not hard at all  Food Insecurity: No Food Insecurity (09/13/2022)   Hunger Vital Sign    Worried About Running Out of Food in the Last Year: Never true    Ran Out of Food in the Last Year: Never true  Transportation Needs: No Transportation Needs (09/13/2022)   PRAPARE - Administrator, Civil Service  (Medical): No    Lack of Transportation (Non-Medical): No  Physical Activity: Inactive (09/13/2022)   Exercise Vital Sign    Days of Exercise per Week: 0 days    Minutes of Exercise per Session: 0 min  Stress: No Stress Concern Present (09/13/2022)   [employer] of Occupational Health - Occupational Stress Questionnaire    Feeling of Stress : Not at all  Social Connections: Socially Isolated (09/13/2022)   Social Connection and Isolation Panel [NHANES]    Frequency of Communication with Friends and Family: More than three times a week    Frequency of Social Gatherings with Friends and Family: Twice a week    Attends Religious Services: Never    Database administrator or Organizations: No    Attends Banker Meetings: Never    Marital Status: Widowed    Tobacco Counseling Ready to quit: Not Answered Counseling given: Not Answered Tobacco comments: 02-11-2019  per pt currently smokes 10 cig's per day , started smoking age 45   Clinical Intake:  Pre-visit preparation completed: Yes  Pain : 0-10 Pain Score: 10-Worst pain ever Pain Type: Chronic pain Pain Location: Back Pain Onset: More than a month ago Pain Frequency: Constant     BMI - recorded: 17.64 Nutritional Status: BMI <19  Underweight Diabetes: No  How often do you need to have someone help you when you read instructions, pamphlets, or other written materials from your doctor or pharmacy?: 1 - Never What is the last grade level you completed in school?: HS graduate  Interpreter Needed?: No  Information entered by :: Enes Wegener   Activities of Daily Living    09/13/2022    1:05 PM 11/25/2021    1:09 PM  In your present state of health, do you have any difficulty performing the following activities:  Hearing? 0   Vision? 0   Difficulty concentrating or making decisions? 0   Walking or climbing stairs? 1   Comment cant climb stairs   Dressing or bathing? 0   Doing errands, shopping? 0 0   Preparing  Food and eating ? Y   Comment preparing food   Using the Toilet? N   In the past six months, have you accidently leaked urine? Y   Comment waers depands   Do you have problems with loss of bowel control? N   Managing your Medications? N   Managing your Finances? N   Housekeeping or managing your Housekeeping? Y   Comment has a lady that assists     Patient Care Team: Sherlene Shams, MD as PCP - General (Internal Medicine) [facility] Salvia, MD as PCP - Electrophysiology (Cardiology) Marinda Elk, MD (Family Medicine)  Indicate any recent Medical Services you may have received from other than [facility] providers in the past year (date may be approximate).     Assessment:   This is a routine wellness examination for [place].  Hearing/Vision screen Hearing Screening - Comments:: No concerns and no hearing test done recently  Dietary issues and exercise activities discussed:     Goals Addressed               This Visit's Progress     Healthy Lifestyle (pt-stated)   On track     Stay active Healthy diet Good water intake      Depression Screen    09/13/2022    1:10 PM 07/07/2022    2:25 PM 06/16/2022   12:23 PM 04/14/2022    1:18 PM 02/16/2022    2:49 PM 11/19/2021    1:49 PM 08/11/2021    3:30 PM  PHQ 2/9 Scores  PHQ - 2 Score 3 1 6  0 0 0 0  PHQ- 9 Score 3  6    0    Fall Risk    09/13/2022    1:07 PM 07/07/2022    2:25 PM 06/20/2022   10:47 AM 06/16/2022   12:23 PM 04/14/2022    1:18 PM  Fall Risk   Falls in the past year? 0 0 0 0 0  Number falls in past yr: 0 0  0 0  Injury with Fall? 0 0  0 0  Risk for fall due to : No Fall Risks History of fall(s)  No Fall Risks   Follow up Falls prevention discussed   Falls evaluation completed     MEDICARE RISK AT HOME:  Medicare Risk at Home - 09/13/22 1321     Any stairs in or around the home? Yes   doesnt use them   If so, are there any without handrails? No    Home free of loose throw rugs in walkways, pet  beds, electrical cords, etc? Yes    Adequate lighting in your home to reduce risk of falls? Yes    Life alert? Yes    Use of a cane, walker or w/c? Yes   walker outside the home   Grab bars in the bathroom? Yes    Shower chair or bench in shower? Yes    Elevated toilet seat or a handicapped toilet? Yes             TIMED UP AND GO:  Was the test performed?  No    Cognitive Function:        09/13/2022    1:22 PM 07/15/2019   11:24 AM 07/09/2018    2:23 PM 06/09/2017    1:56 PM 05/13/2016    4:55 PM  6CIT Screen  What Year? 0 points 0 points 0 points 0 points 0 points  What month? 0 points  0 points 0 points 0 points 0 points  What time? 0 points 0 points 0 points 0 points 0 points  Count back from 20 0 points  0 points 0 points 0 points  Months in reverse 0 points 0 points 0 points 0 points 0 points  Repeat phrase 2 points 0 points 0 points 0 points 0 points  Total Score 2 points  0 points 0 points 0 points    Immunizations Immunization History  Administered Date(s) Administered   Fluad Quad(high Dose 65+) 12/10/2018, 01/13/2020, 02/02/2021, 01/24/2022   Influenza, High Dose Seasonal PF 01/29/2018   Influenza,inj,Quad PF,6+ Mos 12/27/2013   Influenza-Unspecified 12/24/2015, 02/17/2017   Moderna Sars-Covid-2 Vaccination 10/21/2019, 11/18/2019   Pneumococcal Conjugate-13 12/27/2013   Pneumococcal Polysaccharide-23 06/07/2018   Zoster Recombinat (Shingrix) 04/28/2021, 07/07/2021    TDAP status: Due, Education has been provided regarding the importance of this vaccine. Advised may receive this vaccine at local pharmacy or Health Dept. Aware to provide a copy of the vaccination record if obtained from local pharmacy or Health Dept. Verbalized acceptance and understanding.  Flu Vaccine status: Up to date  Pneumococcal vaccine status: Up to date  Covid-19 vaccine status: Completed vaccines  Qualifies for Shingles Vaccine? Yes   Zostavax completed Yes   Shingrix  Completed?: Yes  Screening Tests Health Maintenance  Topic Date Due   DTaP/Tdap/Td (1 - Tdap) Never done   COVID-19 Vaccine (3 - Moderna risk series) 12/16/2019   INFLUENZA VACCINE  10/20/2022   Medicare Annual Wellness (AWV)  09/13/2023   Pneumonia Vaccine 23+ Years old  Completed   DEXA SCAN  Completed   Zoster Vaccines- Shingrix  Completed   HPV VACCINES  Aged Out    Health Maintenance  Health Maintenance Due  Topic Date Due   DTaP/Tdap/Td (1 - Tdap) Never done   COVID-19 Vaccine (3 - Moderna risk series) 12/16/2019    Colorectal cancer screening: Type of screening: Colonoscopy. Completed 12/23/2020. Repeat every TBD years  Mammogram status: declined  Bone Density status: Completed 08/27/2018. Results reflect: Bone density results: OSTEOPOROSIS. Repeat every 2 years.  Lung Cancer Screening: (Low Dose CT Chest recommended if Age 29-80 years, 20 pack-year currently smoking OR have quit w/in 15years.) does qualify.   Lung Cancer Screening Referral: order placed  Additional Screening:  Hepatitis C Screening: does not qualify  Vision Screening: Recommended annual ophthalmology exams for early detection of glaucoma and other disorders of the eye. Is the patient up to date with their annual eye exam?  Yes  Who is the provider or what is the name of the office in which the patient attends annual eye exams? [facility]  If pt is not established with a provider, would they like to be referred to a provider to establish care? No .   Dental Screening: Recommended annual dental exams for proper oral hygiene   Community Resource Referral / Chronic Care Management: CRR required this visit?  No   CCM required this visit?  No     Plan:     I have personally reviewed and noted the following in the patient's chart:   Medical and social history Use of alcohol, tobacco or illicit drugs  Current medications and supplements including opioid prescriptions. Patient is currently  taking opioid prescriptions. Information provided to patient regarding non-opioid alternatives. Patient advised to discuss non-opioid treatment plan with their provider. Functional ability and status Nutritional status Physical activity Advanced directives List of other physicians Hospitalizations, surgeries, and ER visits in previous 12  months Vitals Screenings to include cognitive, depression, and falls Referrals and appointments  In addition, I have reviewed and discussed with patient certain preventive protocols, quality metrics, and best practice recommendations. A written personalized care plan for preventive services as well as general preventive health recommendations were provided to patient.     Khandi Kernes  Arna Medici, CMA   09/13/2022   After Visit Summary: (MyChart) Due to this being a telephonic visit, the after visit summary with patients personalized plan was offered to patient via MyChart   Nurse Notes: due for TDaP and patient declined a mammogram scan.     I have reviewed the above information and agree with above.   Duncan Dull, MD

## 2022-09-20 ENCOUNTER — Telehealth: Payer: Self-pay | Admitting: Student in an Organized Health Care Education/Training Program

## 2022-09-20 NOTE — Telephone Encounter (Signed)
Patient was able to pickup medication 

## 2022-09-20 NOTE — Telephone Encounter (Signed)
PT called states that she is due to pick up Oxycodone on today. When she call pharmacy she was told that she need a PA from doctor. PT stated that she is out of her medication and needs them today. Please give patient a call. TY

## 2022-10-03 ENCOUNTER — Other Ambulatory Visit: Payer: Self-pay | Admitting: Internal Medicine

## 2022-10-04 ENCOUNTER — Other Ambulatory Visit: Payer: Self-pay

## 2022-10-04 MED ORDER — LOSARTAN POTASSIUM 100 MG PO TABS: 100.0000 mg | ORAL_TABLET | Freq: Every day | ORAL | 1 refills | Status: DC

## 2022-10-13 ENCOUNTER — Ambulatory Visit
Payer: Medicare Other | Attending: Student in an Organized Health Care Education/Training Program | Admitting: Student in an Organized Health Care Education/Training Program

## 2022-10-13 ENCOUNTER — Encounter: Payer: Self-pay | Admitting: Student in an Organized Health Care Education/Training Program

## 2022-10-13 VITALS — BP 166/102 | HR 51 | Temp 97.2°F | Resp 18 | Ht 68.0 in | Wt 115.0 lb

## 2022-10-13 DIAGNOSIS — G894 Chronic pain syndrome: Secondary | ICD-10-CM | POA: Insufficient documentation

## 2022-10-13 DIAGNOSIS — Z9689 Presence of other specified functional implants: Secondary | ICD-10-CM | POA: Diagnosis not present

## 2022-10-13 DIAGNOSIS — M47816 Spondylosis without myelopathy or radiculopathy, lumbar region: Secondary | ICD-10-CM | POA: Insufficient documentation

## 2022-10-13 DIAGNOSIS — M48062 Spinal stenosis, lumbar region with neurogenic claudication: Secondary | ICD-10-CM | POA: Insufficient documentation

## 2022-10-13 DIAGNOSIS — M5416 Radiculopathy, lumbar region: Secondary | ICD-10-CM | POA: Diagnosis not present

## 2022-10-13 MED ORDER — OXYCODONE-ACETAMINOPHEN 10-325 MG PO TABS
1.0000 | ORAL_TABLET | ORAL | 0 refills | Status: AC | PRN
Start: 2022-10-20 — End: 2022-11-19

## 2022-10-13 MED ORDER — OXYCODONE-ACETAMINOPHEN 10-325 MG PO TABS
1.0000 | ORAL_TABLET | ORAL | 0 refills | Status: AC | PRN
Start: 2022-11-19 — End: 2022-12-19

## 2022-10-13 MED ORDER — OXYCODONE-ACETAMINOPHEN 10-325 MG PO TABS
1.0000 | ORAL_TABLET | ORAL | 0 refills | Status: DC | PRN
Start: 2022-12-19 — End: 2023-01-11

## 2022-10-13 NOTE — Progress Notes (Signed)
Nursing Pain Medication Assessment:  Safety precautions to be maintained throughout the outpatient stay will include: orient to surroundings, keep bed in low position, maintain call bell within reach at all times, provide assistance with transfer out of bed and ambulation.  Medication Inspection Compliance: Pill count conducted under aseptic conditions, in front of the patient. Neither the pills nor the bottle was removed from the patient's sight at any time. Once count was completed pills were immediately returned to the patient in their original bottle.  Medication:  Pill/Patch Count:  30 of   pills remain Pill/Patch Appearance:  round, white marked "10/325" Bottle Appearance:  bottle previously filled with gabapentin, pt states she transferred percocet to this bottle for ease of use Filled Date:   /   /    Last Medication intake:  Today  Pt advised to ask pharmacist for bottle more conducive for use/easier for pt to open and close.

## 2022-10-13 NOTE — Progress Notes (Signed)
PROVIDER NOTE: Information contained herein reflects review and annotations entered in association with encounter. Interpretation of such information and data should be left to medically-trained personnel. Information provided to patient can be located elsewhere in the medical record under "Patient Instructions". Document created using STT-dictation technology, any transcriptional errors that may result from process are unintentional.    Patient: [name]  Service Category: E/M  Provider: Edward Jolly, MD  DOB: 07/28/1940  DOS: 10/13/2022  Referring Provider: Sherlene Shams, MD  MRN: [id number]  Specialty: Interventional Pain Management  PCP: Sherlene Shams, MD  Type: Established Patient  Setting: Ambulatory outpatient    Location: Office  Delivery: Face-to-face     HPI  [name], a 82 y.o. year old female, is here today because of her Lumbar radiculopathy [M54.16]. [name] primary complain today is Pain (rectal)  Pertinent problems: [name] has Spinal stenosis of thoracolumbar region; Spinal stenosis in cervical region; Spinal stenosis, lumbar region, with neurogenic claudication; Lumbar spondylosis; SI joint arthritis; Lumbar degenerative disc disease; Chronic pain syndrome; Lumbar radiculopathy; Lumbar facet arthropathy; and Back pain on their pertinent problem list. Pain Assessment: Severity of Chronic pain is reported as a 9 /10. Location: Rectum  /denies. Onset: More than a month ago. Quality: Other (Comment) ("It just hurts"). Timing: Constant. Modifying factor(s): meds. Vitals:  height is 5\' 8"  (1.727 m) and weight is 115 lb (52.2 kg). Her temporal temperature is 97.2 F (36.2 C) (abnormal). Her blood pressure is 166/102 (abnormal) and her pulse is 51 (abnormal). Her respiration is 18 and oxygen saturation is 96%.  BMI: Estimated body mass index is 17.49 kg/m as calculated from the following:   Height as of this encounter: 5\' 8"  (1.727 m).   Weight as of this encounter: 115  lb (52.2 kg). Last encounter: 06/02/2022. Last procedure: 06/20/2022.  Reason for encounter: medication management.   No change in medical history since last visit.  Patient's pain is at baseline.  Patient continues multimodal pain regimen as prescribed.  States that it provides some pain relief and improvement in functional status. She is still inquiring about IT pain pump which I advised against She is also inquiring about morphine.  We had a long discussion about it and I do not recommend morphine for her condition given side effects associated with morphine.   Pharmacotherapy Assessment  Analgesic: Percocet 10 mg up to 5 times a day as needed for severe breakthrough pain, quantity 150/month; MME equals 90   Monitoring: [place] PMP: PDMP reviewed during this encounter.       Pharmacotherapy: No side-effects or adverse reactions reported. Compliance: No problems identified. Effectiveness: Clinically acceptable.  Nonah Mattes, RN  10/13/2022  2:39 PM  Sign when Signing Visit Nursing Pain Medication Assessment:  Safety precautions to be maintained throughout the outpatient stay will include: orient to surroundings, keep bed in low position, maintain call bell within reach at all times, provide assistance with transfer out of bed and ambulation.  Medication Inspection Compliance: Pill count conducted under aseptic conditions, in front of the patient. Neither the pills nor the bottle was removed from the patient's sight at any time. Once count was completed pills were immediately returned to the patient in their original bottle.  Medication:  Pill/Patch Count:  30 of   pills remain Pill/Patch Appearance:  round, white marked "10/325" Bottle Appearance:  bottle previously filled with gabapentin, pt states she transferred percocet to this bottle for ease of use Filled Date:   /   /  Last Medication intake:  Today  Pt advised to ask pharmacist for bottle more conducive for use/easier for pt to  open and close.    No results found for: "CBDTHCR" No results found for: "D8THCCBX" No results found for: "D9THCCBX"  UDS:  Summary  Date Value Ref Range Status  12/22/2021 Note  Final    Comment:    ==================================================================== [employer] Select 13 (MW) ==================================================================== Specimen Alert Note: Urinary creatinine is low; ability to detect some drugs may be compromised. Interpret results with caution. (Creatinine) ==================================================================== Test                             Result       Flag       Units  Drug Present and Declared for Prescription Verification   Oxycodone                      11927        EXPECTED   ng/mg creat   Oxymorphone                    13545        EXPECTED   ng/mg creat   Noroxycodone                   15409        EXPECTED   ng/mg creat   Noroxymorphone                 3791         EXPECTED   ng/mg creat    Sources of oxycodone are scheduled prescription medications.    Oxymorphone, noroxycodone, and noroxymorphone are expected    metabolites of oxycodone. Oxymorphone is also available as a    scheduled prescription medication.  ==================================================================== Test                      Result    Flag   Units      Ref Range   Creatinine              11        LL     mg/dL      >=16 ==================================================================== Declared Medications:  The flagging and interpretation on this report are based on the  following declared medications.  Unexpected results may arise from  inaccuracies in the declared medications.   **Note: The testing scope of this panel includes these medications:   Oxycodone (Percocet)   **Note: The testing scope of this panel does not include the  following reported medications:   Acetaminophen (Tylenol)  Acetaminophen (Percocet)   Amitriptyline (Elavil)  Amlodipine (Norvasc)  Gabapentin (Neurontin)  Losartan (Cozaar)  Menthol  Metoprolol (Toprol)  Pantoprazole (Protonix)  Polyethylene Glycol (MiraLAX)  Promethazine (Phenergan)  Sucralfate (Carafate) ==================================================================== For clinical consultation, please call [phone]. ====================================================================       ROS  Constitutional: Denies any fever or chills Gastrointestinal: No reported hemesis, hematochezia, vomiting, or acute GI distress Musculoskeletal:  low back, left hip and left groin pain, left leg pain>right leg pain, rectal pain Neurological: No reported episodes of acute onset apraxia, aphasia, dysarthria, agnosia, amnesia, paralysis, loss of coordination, or loss of consciousness  Medication Review  Menthol (Topical Analgesic), acetaminophen, amLODipine, amitriptyline, celecoxib, gabapentin, losartan, metoprolol succinate, oxyCODONE-acetaminophen, pantoprazole, polyethylene glycol, promethazine, and sucralfate  History Review  Allergy: Ms. Stanaland has No Known Allergies. Drug:  Ms. Mcdermid  reports no history of drug use. Alcohol:  reports no history of alcohol use. Tobacco:  reports that she has been smoking cigarettes. She has a 28 pack-year smoking history. She has never used smokeless tobacco. Social: Ms. Ressler  reports that she has been smoking cigarettes. She has a 28 pack-year smoking history. She has never used smokeless tobacco. She reports that she does not drink alcohol and does not use drugs. Medical:  has a past medical history of AICD (automatic cardioverter/defibrillator) present ([place] cardiologist--- dr Graciela Husbands ), Arthritis, Bladder cancer [facility]) (urologist-  dr Mena Goes), Chronic hyponatremia, Chronic low back pain with bilateral sciatica, Chronic systolic (congestive) heart failure (HCC), 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CC), Coronary artery disease (cardiologist-  dr  Lorine Bears), DDD (degenerative disc disease), thoracolumbar, Frequent urination, Full dentures, Gait instability, GERD (gastroesophageal reflux disease), History of iron deficiency anemia (11/2013), History of stomach ulcers (11/2013), Hypertension, LBBB (left bundle branch block), NICM (nonischemic cardiomyopathy) (HCC) (last echo 12-31-2015 ef 55-60%), Nocturia more than twice per night, S/P insertion of spinal cord stimulator (followed by [facility]---- Dr Letitia Caul (notes in care everywhere)), Scoliosis, Uses walker (02/18/2021), and Wears glasses. Surgical: Ms. Dickens  has a past surgical history that includes Esophagogastroduodenoscopy (N/A, 11/28/2013); Transurethral resection of bladder tumor (N/A, 12/13/2013); Transurethral resection of bladder tumor (N/A, 01/17/2014); Cataract extraction w/ intraocular lens  implant, bilateral (Bilateral, right 12-2013 / left  02-2014); Cystoscopy with retrograde pyelogram, ureteroscopy and stent placement (Bilateral, 04/18/2014); Transurethral resection of bladder tumor (N/A, 04/18/2014); Lumbar disc surgery (1980's); bi-ventricular implantable cardioverter defibrillator (N/A, 05/07/2014); Cystoscopy w/ retrogrades (Bilateral, 01/06/2015); Cystoscopy with biopsy (N/A, 10/06/2015); Cardiac catheterization (03-03-2014  dr Lorine Bears   [facility]); Transurethral resection of bladder (1995); Cardiovascular stress test (11/25/2013); transthoracic echocardiogram (12/31/2015   dr Graciela Husbands); Cystoscopy with fulgeration (N/A, 06/30/2017); Spinal cord stimulator implant (10-09-2018   @[facility] ); Cystoscopy with biopsy (N/A, 02/12/2019); Transurethral resection of bladder tumor (N/A, 12/13/2019); Transurethral resection of bladder tumor (N/A, 10/06/2020); colonscopy (11/2020); Cystoscopy with biopsy (N/A, 03/19/2021); ICD GENERATOR CHANGEOUT (N/A, 11/04/2021); Biv pacemaker generator change out; and Transurethral resection of bladder tumor (N/A, 11/30/2021). Family: family history  includes Cancer in her brother and mother.  Laboratory Chemistry Profile   Renal Lab Results  Component Value Date   BUN 11 02/21/2022   CREATININE 0.68 02/21/2022   BCR 18 04/28/2014   GFR 81.41 02/16/2022   GFRAA >60 06/17/2019   GFRNONAA >60 02/21/2022    Hepatic Lab Results  Component Value Date   AST 38 02/21/2022   ALT 27 02/21/2022   ALBUMIN 3.8 02/21/2022   ALKPHOS 88 02/21/2022   LIPASE 30 11/23/2013    Electrolytes Lab Results  Component Value Date   NA 130 (L) 02/21/2022   K 4.9 02/21/2022   CL 95 (L) 02/21/2022   CALCIUM 9.4 02/21/2022   MG 2.3 06/03/2019    Bone Lab Results  Component Value Date   VD25OH 36.17 01/11/2019    Inflammation (CRP: Acute Phase) (ESR: Chronic Phase) No results found for: "CRP", "ESRSEDRATE", "LATICACIDVEN"       Note: Above Lab results reviewed.  Physical Exam  General appearance: Well nourished, well developed, and well hydrated. In no apparent acute distress Mental status: Alert, oriented x 3 (person, place, & time)       Respiratory: No evidence of acute respiratory distress Eyes: PERLA Vitals: BP (!) 166/102   Pulse (!) 51   Temp (!) 97.2 F (36.2 C) (Temporal)   Resp 18  Ht 5\' 8"  (1.727 m)   Wt 115 lb (52.2 kg)   SpO2 96%   BMI 17.49 kg/m  BMI: Estimated body mass index is 17.49 kg/m as calculated from the following:   Height as of this encounter: 5\' 8"  (1.727 m).   Weight as of this encounter: 115 lb (52.2 kg). Ideal: Ideal body weight: 63.9 kg (140 lb 14 oz)  Lumbar Exam  Skin & Axial Inspection: Well healed scar from previous spine surgery detected, IPG for SCS in place Alignment: Symmetrical Functional ROM: Pain restricted ROM       Stability: No instability detected Muscle Tone/Strength: Functionally intact. No obvious neuro-muscular anomalies detected. Sensory (Neurological): Dermatomal pain pattern Palpation: No palpable anomalies           Gait & Posture Assessment  Ambulation: Patient  ambulates using a walker Gait: Limited. Using assistive device to ambulate Posture: Difficulty standing up straight, due to pain    Lower Extremity Exam      Side: Right lower extremity   Side: Left lower extremity  Stability: No instability observed           Stability: No instability observed          Skin & Extremity Inspection: Skin color, temperature, and hair growth are WNL. No peripheral edema or cyanosis. No masses, redness, swelling, asymmetry, or associated skin lesions. No contractures.   Skin & Extremity Inspection: Skin color, temperature, and hair growth are WNL. No peripheral edema or cyanosis. No masses, redness, swelling, asymmetry, or associated skin lesions. No contractures.  Functional ROM: Pain restricted ROM for all joints of the lower extremity           Functional ROM: Pain restricted ROM for all joints of the lower extremity          Muscle Tone/Strength: Functionally intact. No obvious neuro-muscular anomalies detected.   Muscle Tone/Strength: Functionally intact. No obvious neuro-muscular anomalies detected.  Sensory (Neurological): Dermatomal pain pattern         Sensory (Neurological): Dermatomal pain pattern        DTR: Patellar: deferred today Achilles: deferred today Plantar: deferred today   DTR: Patellar: deferred today Achilles: deferred today Plantar: deferred today  Palpation: No palpable anomalies   Palpation: No palpable anomalies    Assessment   Diagnosis Status  1. Lumbar radiculopathy   2. Spinal stenosis, lumbar region, with neurogenic claudication   3. Lumbar facet arthropathy   4. Lumbar spondylosis   5. Chronic pain syndrome   6. Spinal cord stimulator status    Persistent Persistent Persistent   Updated Problems: No problems updated.  Plan of Care  Problem-specific:  No problem-specific Assessment & Plan notes found for this encounter.  Ms. VICTOIRE DEANS has a current medication list which includes the following long-term  medication(s): amitriptyline, amlodipine, gabapentin, losartan, metoprolol succinate, pantoprazole, promethazine, and sucralfate.  Pharmacotherapy (Medications Ordered): Meds ordered this encounter  Medications   oxyCODONE-acetaminophen (PERCOCET) 10-325 MG tablet    Sig: Take 1 tablet by mouth every 4 (four) hours as needed for pain.    Dispense:  150 tablet    Refill:  0   oxyCODONE-acetaminophen (PERCOCET) 10-325 MG tablet    Sig: Take 1 tablet by mouth every 4 (four) hours as needed for pain.    Dispense:  150 tablet    Refill:  0   oxyCODONE-acetaminophen (PERCOCET) 10-325 MG tablet    Sig: Take 1 tablet by mouth every 4 (four) hours as needed for  pain.    Dispense:  150 tablet    Refill:  0  Continue multimodal analgesics with amitriptyline, gabapentin as well as acetaminophen.  Follow-up plan:   Return in about 3 months (around 01/13/2023) for Medication Management, in person.      Recent Visits No visits were found meeting these conditions. Showing recent visits within past 90 days and meeting all other requirements Today's Visits Date Type Provider Dept  10/13/22 Office Visit Edward Jolly, MD [facility]  Showing today's visits and meeting all other requirements Future Appointments Date Type Provider Dept  01/05/23 Appointment Edward Jolly, MD [facility]  Showing future appointments within next 90 days and meeting all other requirements  I discussed the assessment and treatment plan with the patient. The patient was provided an opportunity to ask questions and all were answered. The patient agreed with the plan and demonstrated an understanding of the instructions.  Patient advised to call back or seek an in-person evaluation if the symptoms or condition worsens.  Duration of encounter: .  Total time on encounter, as per AMA guidelines included both the face-to-face and non-face-to-face time personally spent by the physician and/or  other qualified health care professional(s) on the day of the encounter (includes time in activities that require the physician or other qualified health care professional and does not include time in activities normally performed by clinical staff). Physician's time may include the following activities when performed: Preparing to see the patient (e.g., pre-charting review of records, searching for previously ordered imaging, lab work, and nerve conduction tests) Review of prior analgesic pharmacotherapies. Reviewing PMP Interpreting ordered tests (e.g., lab work, imaging, nerve conduction tests) Performing post-procedure evaluations, including interpretation of diagnostic procedures Obtaining and/or reviewing separately obtained history Performing a medically appropriate examination and/or evaluation Counseling and educating the patient/family/caregiver Ordering medications, tests, or procedures Referring and communicating with other health care professionals (when not separately reported) Documenting clinical information in the electronic or other health record Independently interpreting results (not separately reported) and communicating results to the patient/ family/caregiver Care coordination (not separately reported)  Note by: Edward Jolly, MD Date: 10/13/2022; Time: 3:49 PM

## 2022-10-13 NOTE — Patient Instructions (Signed)

## 2022-10-21 ENCOUNTER — Other Ambulatory Visit: Payer: Self-pay | Admitting: Internal Medicine

## 2022-10-26 ENCOUNTER — Telehealth: Payer: Self-pay | Admitting: Internal Medicine

## 2022-10-26 NOTE — Telephone Encounter (Signed)
[facility] called and wanted to let Dr. Darrick Huntsman know they is going to start [facility] on [name]. If she has any questions she can call. They are going to do an assessment.

## 2022-10-27 NOTE — Telephone Encounter (Signed)
FYI

## 2022-10-28 ENCOUNTER — Telehealth: Payer: Self-pay | Admitting: Internal Medicine

## 2022-10-28 NOTE — Telephone Encounter (Signed)
LMTCB with pt's daughter 

## 2022-10-28 NOTE — Telephone Encounter (Signed)
Pt daughter called in stating that she would like to speak to Dr. Darrick Huntsman nurse. She did not provided with any additional message.

## 2022-10-28 NOTE — Telephone Encounter (Signed)
Patient's daughter, Hilarie Fredrickson, is returning call from Sandy Salaam, CMA.  I transferred call to [place].

## 2022-10-28 NOTE — Telephone Encounter (Signed)
Spoke with pt's daughter and she stated that pt is being released from Hospice because there is nothing else they can do to help with her pain. Daughter stated she reached out to [facility] hospice and they stated that pt would have to stop the aggressive cancer treatment for her bladder cancer in order to help her with her pain, daughter does not want to go that route. She would like to see if Dr. Darrick Huntsman had any other options to help relieve some of her moms pain. I advised pt's daughter that she would need to schedule an appt. Daughter stated that she would have to give [place] a call back.

## 2022-11-02 ENCOUNTER — Other Ambulatory Visit: Payer: Self-pay | Admitting: Internal Medicine

## 2022-11-07 ENCOUNTER — Encounter: Payer: Self-pay | Admitting: Oncology

## 2022-11-07 ENCOUNTER — Ambulatory Visit (INDEPENDENT_AMBULATORY_CARE_PROVIDER_SITE_OTHER): Payer: Medicare Other

## 2022-11-07 DIAGNOSIS — I428 Other cardiomyopathies: Secondary | ICD-10-CM | POA: Diagnosis not present

## 2022-11-07 LAB — CUP PACEART REMOTE DEVICE CHECK
Battery Remaining Longevity: 79 mo
Battery Voltage: 2.99 V
Brady Statistic AP VP Percent: 20.44 %
Brady Statistic AP VS Percent: 0.41 %
Brady Statistic AS VP Percent: 75.95 %
Brady Statistic AS VS Percent: 3.2 %
Brady Statistic RA Percent Paced: 19.9 %
Brady Statistic RV Percent Paced: 92.71 %
Date Time Interrogation Session: 20240819001802
HighPow Impedance: 73 Ohm
Implantable Lead Connection Status: 753985
Implantable Lead Connection Status: 753985
Implantable Lead Connection Status: 753985
Implantable Lead Implant Date: 20160217
Implantable Lead Implant Date: 20160217
Implantable Lead Implant Date: 20160217
Implantable Lead Location: 753858
Implantable Lead Location: 753859
Implantable Lead Location: 753860
Implantable Lead Model: 4298
Implantable Lead Model: 5076
Implantable Pulse Generator Implant Date: 20230817
Lead Channel Impedance Value: 199.5 Ohm
Lead Channel Impedance Value: 208.568
Lead Channel Impedance Value: 237.865
Lead Channel Impedance Value: 237.865
Lead Channel Impedance Value: 250.87 Ohm
Lead Channel Impedance Value: 342 Ohm
Lead Channel Impedance Value: 399 Ohm
Lead Channel Impedance Value: 399 Ohm
Lead Channel Impedance Value: 399 Ohm
Lead Channel Impedance Value: 437 Ohm
Lead Channel Impedance Value: 532 Ohm
Lead Channel Impedance Value: 570 Ohm
Lead Channel Impedance Value: 589 Ohm
Lead Channel Impedance Value: 703 Ohm
Lead Channel Impedance Value: 760 Ohm
Lead Channel Impedance Value: 893 Ohm
Lead Channel Impedance Value: 912 Ohm
Lead Channel Impedance Value: 912 Ohm
Lead Channel Pacing Threshold Amplitude: 0.5 V
Lead Channel Pacing Threshold Amplitude: 0.875 V
Lead Channel Pacing Threshold Amplitude: 1.375 V
Lead Channel Pacing Threshold Pulse Width: 0.4 ms
Lead Channel Pacing Threshold Pulse Width: 0.4 ms
Lead Channel Pacing Threshold Pulse Width: 0.4 ms
Lead Channel Sensing Intrinsic Amplitude: 2.75 mV
Lead Channel Sensing Intrinsic Amplitude: 2.75 mV
Lead Channel Sensing Intrinsic Amplitude: 20.5 mV
Lead Channel Sensing Intrinsic Amplitude: 20.5 mV
Lead Channel Setting Pacing Amplitude: 1.5 V
Lead Channel Setting Pacing Amplitude: 2 V
Lead Channel Setting Pacing Amplitude: 2 V
Lead Channel Setting Pacing Pulse Width: 0.4 ms
Lead Channel Setting Pacing Pulse Width: 0.4 ms
Lead Channel Setting Sensing Sensitivity: 0.45 mV
Zone Setting Status: 755011
Zone Setting Status: 755011

## 2022-11-08 ENCOUNTER — Encounter: Payer: Self-pay | Admitting: Internal Medicine

## 2022-11-08 ENCOUNTER — Telehealth (INDEPENDENT_AMBULATORY_CARE_PROVIDER_SITE_OTHER): Payer: Medicare Other | Admitting: Internal Medicine

## 2022-11-08 VITALS — Ht 68.0 in | Wt 115.0 lb

## 2022-11-08 DIAGNOSIS — G894 Chronic pain syndrome: Secondary | ICD-10-CM

## 2022-11-08 MED ORDER — DULOXETINE HCL 20 MG PO CPEP: 20.0000 mg | ORAL_CAPSULE | Freq: Every day | ORAL | 3 refills | Status: DC

## 2022-11-08 NOTE — Assessment & Plan Note (Addendum)
I have recommended that she consider augmenting oxycodone with a trial of cymbalta instead of amitriptyline , with a gradual increase from 20 mg to 60 mg over the next month .  Continue celebrex and gabapentin

## 2022-11-08 NOTE — Progress Notes (Signed)
Virtual Visit via Caregility   Note   This format is felt to be most appropriate for this patient at this time.  All issues noted in this document were discussed and addressed.  No physical exam was performed (except for noted visual exam findings with Video Visits).   I connected with [name] on 11/08/22 at 11:00 AM EDT by a video enabled telemedicine application or telephone and verified that I am speaking with the correct person using two identifiers. Location patient: home Location provider: work or home office Persons participating in the virtual visit: patient, provider  I discussed the limitations, risks, security and privacy concerns of performing an evaluation and management service by telephone and the availability of in person appointments. I also discussed with the patient that there may be a patient responsible charge related to this service. The patient expressed understanding and agreed to proceed.  Reason for visit: referral to [facility] for pain management   HPI:  [name] is an 82 yr old female with a complicated medical history that includes spinal stenosis , bladder cancer, CAD and COPD/emphysema.  She has chronic back and  rectal pain that has been unmanageable despite use of spinal cord stimulators and opioids, and has been using oxycodone 10 mg every 4 hours prescribed by Dr [facility]el [facility]. She has been unable to take a drug holiday, as recommended by her pain management doctor,  and has continually asked for increased amounts of opioids or alternative pain management.  She has been encouraged to seek a second opinion but has been unable to find an alternative provider, but is considering entering into a contractual agreement with Genev[facility] Norlander for pain management  but  must relinquish all other aggressive comorbidity management .  She states that she is miserable and in 9/10 pain despite use of medications  ROS: See pertinent positives and negatives per  HPI.  Past Medical History:  Diagnosis Date   AICD (automatic cardioverter/defibrillator) present EP cardiologist--- dr Graciela Husbands    placement 05-07-2014 , ef 25%,  NICM/  (02-11-2019 last echo 08-31-2018 ef 60-65%)   Arthritis    "in about all my joints; for sure in my back"   Bladder cancer [facility]) urologist-  dr Mena Goes   dx 1995--  recurrent bladder cancer 2015 , s/p TURBT's and chemo instillation's ;   04/ 2019  s/p TURBT   Chronic hyponatremia    Chronic low back pain with bilateral sciatica    s/p  spinal cord stimulator @ [facility]  10-09-2018   Chronic systolic (congestive) heart failure [facility])    cardiologist-  dr Jerolyn Center   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CC)    (02-11-2019  per pt has never been on oxygen)   Coronary artery disease cardiologist-  dr Lorine Bears   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% per cardiac cath 03-03-2014   DDD (degenerative disc disease), thoracolumbar    Frequent urination    Full dentures    Gait instability    due to chronic low back pain, uses roller walker   GERD (gastroesophageal reflux disease)    History of iron deficiency anemia 11/2013   resolved w/ IV Iron  (02-11-2019  per pt has not had any issues since 2015)   History of stomach ulcers 11/2013   Hypertension    LBBB (left bundle branch block)    NICM (nonischemic cardiomyopathy) (HCC) last echo 12-31-2015 ef 55-60%   dx 09/ 2015 per echo 20%;  myoview 09/ 2015 ef 28%;  per  cardiac cath 12/ 2015 ef 30%;     Nocturia more than twice per night    S/P insertion of spinal cord stimulator followed by [facility]---- Dr Letitia Caul (notes in care everywhere)   10-09-2018  @[facility] --- thoracic spinal cord stimular/ generator  (device from [facility])---- per pt has a control   Scoliosis    Uses walker 02/18/2021   all the time   Wears glasses    for reading    Past Surgical History:  Procedure Laterality Date   BI-VENTRICULAR IMPLANTABLE CARDIOVERTER DEFIBRILLATOR N/A 05/07/2014   Procedure: BI-VENTRICULAR  IMPLANTABLE CARDIOVERTER DEFIBRILLATOR  (CRT-D);  Surgeon: [facility] Salvia, MD;  Location: [facility] CATH LAB;  Service: Cardiovascular;  Laterality: N/A;   BIV PACEMAKER GENERATOR CHANGE OUT     CARDIAC CATHETERIZATION  03-03-2014  dr Lorine Bears   [facility]   pLAD 20%, pRCA 20%, dRCA 50%, RPLS 50%;  ef 30%, mild elevated LVEDP, mild gradient across aortic valve LVOT   CARDIOVASCULAR STRESS TEST  11/25/2013   High risk nuclear study w/ large high severity inferior wall perfusion defect on stress and rest images, large mild severity anteroseptal wall perfusion defect on stress and rest images, No inducible ischemia/ global moderate hypokinesis, ef 28%   CATARACT EXTRACTION W/ INTRAOCULAR LENS  IMPLANT, BILATERAL Bilateral right 12-2013 / left  02-2014   colonscopy  11/2020   benign polyps removed   CYSTOSCOPY W/ RETROGRADES Bilateral 01/06/2015   Procedure: CYSTOSCOPY WITH  BLADDER BIOPSY BILATERAL RETROGRADE PYELOGRAM,INSTILLATION OF MITOMYCIN C;  Surgeon: Jerilee Field, MD;  Location: [facility];  Service: Urology;  Laterality: Bilateral;   CYSTOSCOPY WITH BIOPSY N/A 10/06/2015   Procedure: CYSTO WITH BLADDER BIOPSY, FULGERATION, CHEMO IRRIGATION EPIRUBICIN IN PACU;  Surgeon: Jerilee Field, MD;  Location: [facility];  Service: Urology;  Laterality: N/A;   CYSTOSCOPY WITH BIOPSY N/A 02/12/2019   Procedure: CYSTOSCOPY WITH BIOPSY/ FULGURATION/ INSTILLATION OF GEMCITABINE, bilateral retrograde turbt greater 5cm;  Surgeon: Jerilee Field, MD;  Location: [facility];  Service: Urology;  Laterality: N/A;   CYSTOSCOPY WITH BIOPSY N/A 03/19/2021   Procedure: CYSTOSCOPY WITH TRANSURETHRAL RESECTION OF BLADDER TUMOR  GREATER THAN 5CM WITH POST-OPERATIVE INSTILLATION OF EPIRUBICIN;  Surgeon: Jerilee Field, MD;  Location: [facility];  Service: Urology;  Laterality: N/A;   CYSTOSCOPY WITH FULGERATION N/A 06/30/2017   Procedure: Velda Shell CYSTOSCOPY WITH [place] FULGERATION/ BLADDER BIOPSY/ INSTILLATION  OF EPIRUBICIN;  Surgeon: Jerilee Field, MD;  Location: [facility];  Service: Urology;  Laterality: N/A;   CYSTOSCOPY WITH RETROGRADE PYELOGRAM, URETEROSCOPY AND STENT PLACEMENT Bilateral 04/18/2014   Procedure: CYSTOSCOPY WITH RETROGRADE PYELOGRAM;  Surgeon: Jerilee Field, MD;  Location: [facility];  Service: Urology;  Laterality: Bilateral;   ESOPHAGOGASTRODUODENOSCOPY N/A 11/28/2013   Procedure: ESOPHAGOGASTRODUODENOSCOPY (EGD);  Surgeon: Willis Modena, MD;  Location: [facility] ENDOSCOPY;  Service: Endoscopy;  Laterality: N/A;   ICD GENERATOR CHANGEOUT N/A 11/04/2021   Procedure: ICD GENERATOR CHANGEOUT;  Surgeon: [facility] Salvia, MD;  Location: [facility] INVASIVE CV LAB;  Service: Cardiovascular;  Laterality: N/A;   LUMBAR DISC SURGERY  1980's   "ruptured disc"   SPINAL CORD STIMULATOR IMPLANT  10-09-2018   @[facility]    Thoracic spinal cord stimular/ genertor  (left flank)----- (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[employer])   TRANSTHORACIC ECHOCARDIOGRAM  12/31/2015   dr Graciela Husbands   ef 55-605, grade 1 diastolic dysfunction/ mild MR/ septal motion showed abnormal function and dyssynergy   TRANSURETHRAL RESECTION OF BLADDER  1995   TRANSURETHRAL RESECTION OF BLADDER TUMOR N/A 12/13/2013  Procedure: TRANSURETHRAL RESECTION OF BLADDER TUMOR (TURBT);  Surgeon: Jerilee Field, MD;  Location: [facility];  Service: Urology;  Laterality: N/A;   TRANSURETHRAL RESECTION OF BLADDER TUMOR N/A 01/17/2014   Procedure: TRANSURETHRAL RESECTION OF BLADDER TUMOR (TURBT);  Surgeon: Jerilee Field, MD;  Location: [facility];  Service: Urology;  Laterality: N/A;   TRANSURETHRAL RESECTION OF BLADDER TUMOR N/A 04/18/2014   Procedure: TRANSURETHRAL RESECTION OF BLADDER TUMOR (TURBT), CYSTOGRAM;  Surgeon: Jerilee Field, MD;  Location: [facility];  Service: Urology;  Laterality: N/A;   TRANSURETHRAL RESECTION OF BLADDER TUMOR N/A 12/13/2019   Procedure: TRANSURETHRAL RESECTION OF BLADDER TUMOR (TURBT) GREATER THAN 5CM WITH CYSTOSCOPY/ POST  OPERATIVE INSTILLATION OF GEMCITABINE;  Surgeon: Jerilee Field, MD;  Location: [facility];  Service: Urology;  Laterality: N/A;   TRANSURETHRAL RESECTION OF BLADDER TUMOR N/A 10/06/2020   Procedure: TRANSURETHRAL RESECTION OF BLADDER TUMOR (TURBT)/ CYSTOSCOPY/  BLADDER BIOPSY;  Surgeon: Jerilee Field, MD;  Location: [facility];  Service: Urology;  Laterality: N/A;   TRANSURETHRAL RESECTION OF BLADDER TUMOR N/A 11/30/2021   Procedure: TRANSURETHRAL RESECTION OF BLADDER TUMOR (TURBT) WITH POST OPERATIVE INSTILLATION OF GEMCITABINE;  Surgeon: Jerilee Field, MD;  Location: [facility];  Service: Urology;  Laterality: N/A;    Family History  Problem Relation Age of Onset   Cancer Mother    Cancer Brother     SOCIAL HX:  reports that she has been smoking cigarettes. She has a 28 pack-year smoking history. She has never used smokeless tobacco. She reports that she does not drink alcohol and does not use drugs.    Current Outpatient Medications:    acetaminophen (TYLENOL) 500 MG tablet, Take 1,000 mg by mouth every 8 (eight) hours as needed for moderate pain., Disp: , Rfl:    amitriptyline (ELAVIL) 50 MG tablet, TAKE 1 TABLET(50 MG) BY MOUTH AT BEDTIME, Disp: 90 tablet, Rfl: 1   amLODipine (NORVASC) 5 MG tablet, TAKE 1 TABLET(5 MG) BY MOUTH DAILY, Disp: 90 tablet, Rfl: 2   celecoxib (CELEBREX) 200 MG capsule, TAKE 1 CAPSULE(200 MG) BY MOUTH TWICE DAILY, Disp: 60 capsule, Rfl: 0   DULoxetine (CYMBALTA) 20 MG capsule, Take 1 capsule (20 mg total) by mouth daily., Disp: 30 capsule, Rfl: 3   gabapentin (NEURONTIN) 300 MG capsule, TAKE 2 CAPSULES(600 MG) BY MOUTH THREE TIMES DAILY, Disp: 270 capsule, Rfl: 1   losartan (COZAAR) 100 MG tablet, Take 1 tablet (100 mg total) by mouth at bedtime., Disp: 90 tablet, Rfl: 1   Menthol, Topical Analgesic, (BIOFREEZE EX), Apply 1 application topically 4 (four) times daily as needed (back pain)., Disp: , Rfl:    metoprolol succinate (TOPROL-XL) 25  MG 24 hr tablet, TAKE 1 TABLET(25 MG) BY MOUTH DAILY, Disp: 90 tablet, Rfl: 3   oxyCODONE-acetaminophen (PERCOCET) 10-325 MG tablet, Take 1 tablet by mouth every 4 (four) hours as needed for pain., Disp: 150 tablet, Rfl: 0   [START ON 11/19/2022] oxyCODONE-acetaminophen (PERCOCET) 10-325 MG tablet, Take 1 tablet by mouth every 4 (four) hours as needed for pain., Disp: 150 tablet, Rfl: 0   [START ON 12/19/2022] oxyCODONE-acetaminophen (PERCOCET) 10-325 MG tablet, Take 1 tablet by mouth every 4 (four) hours as needed for pain., Disp: 150 tablet, Rfl: 0   pantoprazole (PROTONIX) 40 MG tablet, Take 1 tablet (40 mg total) by mouth at bedtime., Disp: 90 tablet, Rfl: 2   polyethylene glycol (MIRALAX / GLYCOLAX) packet, Take 17 g by mouth daily as needed for mild constipation. , Disp: , Rfl:  promethazine (PHENERGAN) 12.5 MG tablet, TAKE 1 TABLET(12.5 MG) BY MOUTH EVERY 8 HOURS AS NEEDED FOR NAUSEA OR VOMITING, Disp: 30 tablet, Rfl: 0   sucralfate (CARAFATE) 1 g tablet, TAKE 1 TABLET BY MOUTH ONE HOUR BEFORE EACH MEAL AND TWO HOURS AFTER OTHER MEDICATIONS (Patient taking differently: Take 1 g by mouth See admin instructions. Take 1 tablet one hour before each meal and 2 hours after all other medications), Disp: 90 tablet, Rfl: 1 No current facility-administered medications for this visit.  Facility-Administered Medications Ordered in Other Visits:    epirubicin (ELLENCE) 50 mg in sodium chloride 0.9 % bladder instillation, 50 mg, Bladder Instillation, Once, Jerilee Field, MD   gemcitabine [facility]) chemo syringe for bladder instillation 2,000 mg, 2,000 mg, Bladder Instillation, Once, Jerilee Field, MD   gemcitabine [facility]) chemo syringe for bladder instillation 2,000 mg, 2,000 mg, Bladder Instillation, Once, Jerilee Field, MD  EXAMTheodoro Kos per patient if applicable:  GENERAL: alert, oriented, appears well and in no acute distress. Lying in bed  HEENT: atraumatic, conjunttiva clear, no  obvious abnormalities on inspection of external nose and ears  NECK: normal movements of the head and neck  LUNGS: on inspection no signs of respiratory distress, breathing rate appears normal, no obvious gross SOB, gasping or wheezing  CV: no obvious cyanosis  MS: moves all visible extremities without noticeable abnormality  PSYCH/NEURO: pleasant and cooperative, no obvious depression or anxiety, speech and thought processing grossly intact  ASSESSMENT AND PLAN: Chronic pain syndrome Assessment & Plan: I have recommended that she consider augmenting oxycodone with a trial of cymbalta instead of amitriptyline , with a gradual increase from 20 mg to 60 mg over the next month .  Continue celebrex and gabapentin     Other orders -     DULoxetine HCl; Take 1 capsule (20 mg total) by mouth daily.  Dispense: 30 capsule; Refill: 3      I discussed the assessment and treatment plan with the patient. The patient was provided an opportunity to ask questions and all were answered. The patient agreed with the plan and demonstrated an understanding of the instructions.   The patient was advised to call back or seek an in-person evaluation if the symptoms worsen or if the condition fails to improve as anticipated.   I spent 30 minutes dedicated to the care of this patient on the date of this encounter to include pre-visit review of her medical history,  Face-to-face time with the patient , and post visit ordering of testing and therapeutics.    Sherlene Shams, MD

## 2022-11-08 NOTE — Patient Instructions (Addendum)
BEFORE YOU ENTER INTO A RELATIONSHIP WITH [facility]:  I am recommending a trial of cymbalta instead of amitriptyline at bedtime   The starting dose is 20 mg,  you can icreaes to 40 mg after one week  and let me know if it is helping  we can increase to 60 mg after 2 weeks of 40 mg dose   You can continue the gabapentin and the celebrex

## 2022-11-10 NOTE — Telephone Encounter (Signed)
Melissa from [facility] came into the office to drop off a form the provider to fill out. Placed in provider folder

## 2022-11-10 NOTE — Telephone Encounter (Signed)
Verified with pt's daughter that they did request the consult for [place]. Form has been placed in quick sign folder.

## 2022-11-11 ENCOUNTER — Telehealth: Payer: Self-pay

## 2022-11-11 NOTE — Telephone Encounter (Signed)
Melissa from [place] called wanting to see if the order can be faxed back today for hospice. The pt family will pay out of pocket Melissa #[phone]

## 2022-11-11 NOTE — Telephone Encounter (Signed)
Paperwork is in providers folder to be signed. Will notify pcp

## 2022-11-11 NOTE — Telephone Encounter (Signed)
Paper work has been faxed

## 2022-11-11 NOTE — Telephone Encounter (Signed)
Called and informed Melissa with [facility] that paper work was faxed over to her

## 2022-11-11 NOTE — Telephone Encounter (Signed)
[name] is calling from [facility] to follow-up on a request for eval and admit for hospice if appropriate.  [name] states it was faxed yesterday.

## 2022-11-17 ENCOUNTER — Other Ambulatory Visit: Payer: Self-pay | Admitting: Internal Medicine

## 2022-11-17 NOTE — Progress Notes (Signed)
Remote ICD transmission.   

## 2022-11-29 ENCOUNTER — Other Ambulatory Visit: Payer: Self-pay | Admitting: Internal Medicine

## 2022-11-30 ENCOUNTER — Other Ambulatory Visit: Payer: Self-pay | Admitting: Urology

## 2022-11-30 DIAGNOSIS — Z8551 Personal history of malignant neoplasm of bladder: Secondary | ICD-10-CM

## 2022-12-08 ENCOUNTER — Other Ambulatory Visit: Payer: Self-pay | Admitting: Internal Medicine

## 2022-12-12 ENCOUNTER — Ambulatory Visit: Admission: RE | Admit: 2022-12-12 | Payer: Medicare Other | Source: Ambulatory Visit

## 2022-12-22 ENCOUNTER — Other Ambulatory Visit: Payer: Self-pay | Admitting: Internal Medicine

## 2022-12-22 DIAGNOSIS — G894 Chronic pain syndrome: Secondary | ICD-10-CM

## 2022-12-22 DIAGNOSIS — M47816 Spondylosis without myelopathy or radiculopathy, lumbar region: Secondary | ICD-10-CM

## 2022-12-22 DIAGNOSIS — M5416 Radiculopathy, lumbar region: Secondary | ICD-10-CM

## 2022-12-23 ENCOUNTER — Other Ambulatory Visit: Payer: Self-pay | Admitting: Internal Medicine

## 2022-12-23 DIAGNOSIS — M47816 Spondylosis without myelopathy or radiculopathy, lumbar region: Secondary | ICD-10-CM

## 2022-12-23 DIAGNOSIS — G894 Chronic pain syndrome: Secondary | ICD-10-CM

## 2022-12-23 DIAGNOSIS — M5416 Radiculopathy, lumbar region: Secondary | ICD-10-CM

## 2022-12-29 ENCOUNTER — Other Ambulatory Visit: Payer: Self-pay | Admitting: Internal Medicine

## 2022-12-30 ENCOUNTER — Other Ambulatory Visit: Payer: Self-pay | Admitting: Internal Medicine

## 2022-12-30 DIAGNOSIS — Z8551 Personal history of malignant neoplasm of bladder: Secondary | ICD-10-CM | POA: Diagnosis not present

## 2022-12-30 NOTE — Telephone Encounter (Signed)
Refilled 2 weeks ago

## 2023-01-03 ENCOUNTER — Other Ambulatory Visit: Payer: Self-pay

## 2023-01-03 MED ORDER — PANTOPRAZOLE SODIUM 40 MG PO TBEC: 40.0000 mg | DELAYED_RELEASE_TABLET | Freq: Every day | ORAL | 2 refills | Status: DC

## 2023-01-05 ENCOUNTER — Ambulatory Visit
Payer: Medicare Other | Attending: Student in an Organized Health Care Education/Training Program | Admitting: Student in an Organized Health Care Education/Training Program

## 2023-01-09 ENCOUNTER — Telehealth: Payer: Self-pay | Admitting: Student in an Organized Health Care Education/Training Program

## 2023-01-09 ENCOUNTER — Other Ambulatory Visit: Payer: Self-pay | Admitting: Internal Medicine

## 2023-01-09 NOTE — Telephone Encounter (Signed)
Patient states she does not have enough meds to last until 01-31-23. She missed her appt on 01-05-23. Says she did not mean to. She can't go until 01-31-23 with no meds. I did explain she needs to stretch her meds out but she insist she cannot do that. Wants to speak with a nurse or Dr Cherylann Ratel.

## 2023-01-09 NOTE — Telephone Encounter (Signed)
Explained to patient that she had to take the next available appt since she missed her appt.  Instructed her to stretch out her meds until her next appt. I informed her that we would call her if we had any call ins.  Patient was not happy. States she will have to call her PCP to get the meds.

## 2023-01-10 ENCOUNTER — Telehealth: Payer: Self-pay | Admitting: Student in an Organized Health Care Education/Training Program

## 2023-01-10 NOTE — Telephone Encounter (Signed)
Patient scheduled for appt 01/11/23 per MD

## 2023-01-10 NOTE — Telephone Encounter (Signed)
Patient will be out of meds before 11-12 appt. She missed last appt.  Says she cannot just take less medication. Wants to know if she can come in tomorrow or what other option she has

## 2023-01-11 ENCOUNTER — Ambulatory Visit
Payer: Medicare Other | Attending: Student in an Organized Health Care Education/Training Program | Admitting: Student in an Organized Health Care Education/Training Program

## 2023-01-11 ENCOUNTER — Encounter: Payer: Self-pay | Admitting: Student in an Organized Health Care Education/Training Program

## 2023-01-11 VITALS — BP 154/73 | HR 88 | Temp 97.2°F | Resp 16 | Ht 68.0 in | Wt 115.0 lb

## 2023-01-11 DIAGNOSIS — M48062 Spinal stenosis, lumbar region with neurogenic claudication: Secondary | ICD-10-CM | POA: Insufficient documentation

## 2023-01-11 DIAGNOSIS — G894 Chronic pain syndrome: Secondary | ICD-10-CM | POA: Insufficient documentation

## 2023-01-11 DIAGNOSIS — M5416 Radiculopathy, lumbar region: Secondary | ICD-10-CM | POA: Insufficient documentation

## 2023-01-11 DIAGNOSIS — M47816 Spondylosis without myelopathy or radiculopathy, lumbar region: Secondary | ICD-10-CM | POA: Diagnosis not present

## 2023-01-11 MED ORDER — OXYCODONE-ACETAMINOPHEN 10-325 MG PO TABS
1.0000 | ORAL_TABLET | ORAL | 0 refills | Status: AC | PRN
Start: 2023-02-15 — End: 2023-03-17

## 2023-01-11 MED ORDER — OXYCODONE-ACETAMINOPHEN 10-325 MG PO TABS: 1.0000 | ORAL_TABLET | ORAL | 0 refills | Status: DC | PRN

## 2023-01-11 MED ORDER — OXYCODONE-ACETAMINOPHEN 10-325 MG PO TABS: 1.0000 | ORAL_TABLET | ORAL | 0 refills | Status: AC | PRN

## 2023-01-11 NOTE — Progress Notes (Signed)
Nursing Pain Medication Assessment:  Safety precautions to be maintained throughout the outpatient stay will include: orient to surroundings, keep bed in low position, maintain call bell within reach at all times, provide assistance with transfer out of bed and ambulation.  Medication Inspection Compliance: Pill count conducted under aseptic conditions, in front of the patient. Neither the pills nor the bottle was removed from the patient's sight at any time. Once count was completed pills were immediately returned to the patient in their original bottle.  Medication: Oxycodone/APAP Pill/Patch Count:  38 of 150 pills remain Pill/Patch Appearance: Markings consistent with prescribed medication Bottle Appearance: Standard pharmacy container. Clearly labeled. Filled Date: 09 / 29 / 2024 Last Medication intake:  Today

## 2023-01-11 NOTE — Progress Notes (Signed)
PROVIDER NOTE: Information contained herein reflects review and annotations entered in association with encounter. Interpretation of such information and data should be left to medically-trained personnel. Information provided to patient can be located elsewhere in the medical record under "Patient Instructions". Document created using STT-dictation technology, any transcriptional errors that may result from process are unintentional.    Patient: [name]  Service Category: E/M  Provider: Edward Jolly, MD  DOB: Jan 13, 1941  DOS: 01/11/2023  Referring Provider: Sherlene Shams, MD  MRN: [id number]  Specialty: Interventional Pain Management  PCP: Sherlene Shams, MD  Type: Established Patient  Setting: Ambulatory outpatient    Location: Office  Delivery: Face-to-face     HPI  [name], a 82 y.o. year old female, is here today because of her Lumbar radiculopathy [M54.16]. [name] primary complain today is Other (Rectal pain )  Pertinent problems: [name] has Spinal stenosis of thoracolumbar region; Spinal stenosis in cervical region; Spinal stenosis, lumbar region, with neurogenic claudication; Lumbar spondylosis; SI joint arthritis (HCC); Lumbar degenerative disc disease; Chronic pain syndrome; Lumbar radiculopathy; Lumbar facet arthropathy; and Back pain on their pertinent problem list. Pain Assessment: Severity of Chronic pain is reported as a 10-Worst pain ever/10. Location: Rectum Other (Comment) (no laterality)/into lower buttocks and upper thighs. Onset: More than a month ago. Quality: Discomfort, Constant. Timing: Constant. Modifying factor(s): pain medications. Vitals:  height is 5\' 8"  (1.727 m) and weight is 115 lb (52.2 kg). Her temporal temperature is 97.2 F (36.2 C) (abnormal). Her blood pressure is 154/73 (abnormal) and her pulse is 88. Her respiration is 16 and oxygen saturation is 100%.  BMI: Estimated body mass index is 17.49 kg/m as calculated from the following:    Height as of this encounter: 5\' 8"  (1.727 m).   Weight as of this encounter: 115 lb (52.2 kg). Last encounter: 10/13/22 Last procedure: 06/20/2022.  Reason for encounter: medication management.   No change in medical history since last visit.  Patient's pain is at baseline.  Patient continues multimodal pain regimen as prescribed.  States that it provides some pain relief and improvement in functional status. Continues to struggle with rectal pain, states that SCS is helping to manage low back pain   Pharmacotherapy Assessment  Analgesic: Percocet 10 mg up to 5 times a day as needed for severe breakthrough pain, quantity 150/month; MME equals 90   Monitoring: [place] PMP: PDMP reviewed during this encounter.       Pharmacotherapy: No side-effects or adverse reactions reported. Compliance: No problems identified. Effectiveness: Clinically acceptable.  Vernie Ammons, RN  01/11/2023  8:57 AM  Sign when Signing Visit Nursing Pain Medication Assessment:  Safety precautions to be maintained throughout the outpatient stay will include: orient to surroundings, keep bed in low position, maintain call bell within reach at all times, provide assistance with transfer out of bed and ambulation.  Medication Inspection Compliance: Pill count conducted under aseptic conditions, in front of the patient. Neither the pills nor the bottle was removed from the patient's sight at any time. Once count was completed pills were immediately returned to the patient in their original bottle.  Medication: Oxycodone/APAP Pill/Patch Count:  38 of 150 pills remain Pill/Patch Appearance: Markings consistent with prescribed medication Bottle Appearance: Standard pharmacy container. Clearly labeled. Filled Date: 09 / 29 / 2024 Last Medication intake:  Today    No results found for: "CBDTHCR" No results found for: "D8THCCBX" No results found for: "D9THCCBX"  UDS:  Summary  Date Value Ref Range Status  12/22/2021  Note  Final    Comment:    ==================================================================== Tox[employer] Select 13 (MW) ==================================================================== Specimen Alert Note: Urinary creatinine is low; ability to detect some drugs may be compromised. Interpret results with caution. (Creatinine) ==================================================================== Test                             Result       Flag       Units  Drug Present and Declared for Prescription Verification   Oxycodone                      11927        EXPECTED   ng/mg creat   Oxymorphone                    13545        EXPECTED   ng/mg creat   Noroxycodone                   15409        EXPECTED   ng/mg creat   Noroxymorphone                 3791         EXPECTED   ng/mg creat    Sources of oxycodone are scheduled prescription medications.    Oxymorphone, noroxycodone, and noroxymorphone are expected    metabolites of oxycodone. Oxymorphone is also available as a    scheduled prescription medication.  ==================================================================== Test                      Result    Flag   Units      Ref Range   Creatinine              11        LL     mg/dL      >=30 ==================================================================== Declared Medications:  The flagging and interpretation on this report are based on the  following declared medications.  Unexpected results may arise from  inaccuracies in the declared medications.   **Note: The testing scope of this panel includes these medications:   Oxycodone (Percocet)   **Note: The testing scope of this panel does not include the  following reported medications:   Acetaminophen (Tylenol)  Acetaminophen (Percocet)  Amitriptyline (Elavil)  Amlodipine (Norvasc)  Gabapentin (Neurontin)  Losartan (Cozaar)  Menthol  Metoprolol (Toprol)  Pantoprazole (Protonix)  Polyethylene Glycol (MiraLAX)   Promethazine (Phenergan)  Sucralfate (Carafate) ==================================================================== For clinical consultation, please call [phone]. ====================================================================       ROS  Constitutional: Denies any fever or chills Gastrointestinal: No reported hemesis, hematochezia, vomiting, or acute GI distress Musculoskeletal:  low back, left hip and left groin pain, left leg pain>right leg pain, rectal pain Neurological: No reported episodes of acute onset apraxia, aphasia, dysarthria, agnosia, amnesia, paralysis, loss of coordination, or loss of consciousness  Medication Review  DULoxetine, Menthol (Topical Analgesic), acetaminophen, amLODipine, amitriptyline, celecoxib, gabapentin, losartan, metoprolol succinate, oxyCODONE-acetaminophen, pantoprazole, polyethylene glycol, promethazine, and sucralfate  History Review  Allergy: [name] has No Known Allergies. Drug: [name]  reports no history of drug use. Alcohol:  reports no history of alcohol use. Tobacco:  reports that she has been smoking cigarettes. She has a 28 pack-year smoking history. She has never used smokeless tobacco. Social: [name]  reports that  she has been smoking cigarettes. She has a 28 pack-year smoking history. She has never used smokeless tobacco. She reports that she does not drink alcohol and does not use drugs. Medical:  has a past medical history of AICD (automatic cardioverter/defibrillator) present ([place] cardiologist--- dr Graciela Husbands ), Arthritis, Bladder cancer [facility]) (urologist-  dr Mena Goes), Chronic hyponatremia, Chronic low back pain with bilateral sciatica, Chronic systolic (congestive) heart failure (HCC), 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CC), Coronary artery disease (cardiologist-  dr Lorine Bears), DDD (degenerative disc disease), thoracolumbar, Frequent urination, Full dentures, Gait instability, GERD (gastroesophageal reflux disease), History of iron  deficiency anemia (11/2013), History of stomach ulcers (11/2013), Hypertension, LBBB (left bundle branch block), NICM (nonischemic cardiomyopathy) (HCC) (last echo 12-31-2015 ef 55-60%), Nocturia more than twice per night, S/P insertion of spinal cord stimulator (followed by [facility]---- Dr Letitia Caul (notes in care everywhere)), Scoliosis, Uses walker (02/18/2021), and Wears glasses. Surgical: Ms. Kittelson  has a past surgical history that includes Esophagogastroduodenoscopy (N/A, 11/28/2013); Transurethral resection of bladder tumor (N/A, 12/13/2013); Transurethral resection of bladder tumor (N/A, 01/17/2014); Cataract extraction w/ intraocular lens  implant, bilateral (Bilateral, right 12-2013 / left  02-2014); Cystoscopy with retrograde pyelogram, ureteroscopy and stent placement (Bilateral, 04/18/2014); Transurethral resection of bladder tumor (N/A, 04/18/2014); Lumbar disc surgery (1980's); bi-ventricular implantable cardioverter defibrillator (N/A, 05/07/2014); Cystoscopy w/ retrogrades (Bilateral, 01/06/2015); Cystoscopy with biopsy (N/A, 10/06/2015); Cardiac catheterization (03-03-2014  dr Lorine Bears   [facility]); Transurethral resection of bladder (1995); Cardiovascular stress test (11/25/2013); transthoracic echocardiogram (12/31/2015   dr Graciela Husbands); Cystoscopy with fulgeration (N/A, 06/30/2017); Spinal cord stimulator implant (10-09-2018   @[facility] ); Cystoscopy with biopsy (N/A, 02/12/2019); Transurethral resection of bladder tumor (N/A, 12/13/2019); Transurethral resection of bladder tumor (N/A, 10/06/2020); colonscopy (11/2020); Cystoscopy with biopsy (N/A, 03/19/2021); ICD GENERATOR CHANGEOUT (N/A, 11/04/2021); Biv pacemaker generator change out; and Transurethral resection of bladder tumor (N/A, 11/30/2021). Family: family history includes Cancer in her brother and mother.  Laboratory Chemistry Profile   Renal Lab Results  Component Value Date   BUN 11 02/21/2022   CREATININE 0.68 02/21/2022    BCR 18 04/28/2014   GFR 81.41 02/16/2022   GFRAA >60 06/17/2019   GFRNONAA >60 02/21/2022    Hepatic Lab Results  Component Value Date   AST 38 02/21/2022   ALT 27 02/21/2022   ALBUMIN 3.8 02/21/2022   ALKPHOS 88 02/21/2022   LIPASE 30 11/23/2013    Electrolytes Lab Results  Component Value Date   NA 130 (L) 02/21/2022   K 4.9 02/21/2022   CL 95 (L) 02/21/2022   CALCIUM 9.4 02/21/2022   MG 2.3 06/03/2019    Bone Lab Results  Component Value Date   VD25OH 36.17 01/11/2019    Inflammation (CRP: Acute Phase) (ESR: Chronic Phase) No results found for: "CRP", "ESRSEDRATE", "LATICACIDVEN"       Note: Above Lab results reviewed.  Physical Exam  General appearance: Well nourished, well developed, and well hydrated. In no apparent acute distress Mental status: Alert, oriented x 3 (person, place, & time)       Respiratory: No evidence of acute respiratory distress Eyes: PERLA Vitals: BP (!) 154/73 (BP Location: Left Arm, Patient Position: Sitting, Cuff Size: Normal)   Pulse 88   Temp (!) 97.2 F (36.2 C) (Temporal)   Resp 16   Ht 5\' 8"  (1.727 m)   Wt 115 lb (52.2 kg)   SpO2 100%   BMI 17.49 kg/m  BMI: Estimated body mass index is 17.49 kg/m as calculated from the following:  Height as of this encounter: 5\' 8"  (1.727 m).   Weight as of this encounter: 115 lb (52.2 kg). Ideal: Ideal body weight: 63.9 kg (140 lb 14 oz)  Lumbar Exam  Skin & Axial Inspection: Well healed scar from previous spine surgery detected, IPG for SCS in place Alignment: Symmetrical Functional ROM: Pain restricted ROM       Stability: No instability detected Muscle Tone/Strength: Functionally intact. No obvious neuro-muscular anomalies detected. Sensory (Neurological): Dermatomal pain pattern Palpation: No palpable anomalies           Gait & Posture Assessment  Ambulation: Patient ambulates using a walker Gait: Limited. Using assistive device to ambulate Posture: Difficulty standing up  straight, due to pain    Lower Extremity Exam      Side: Right lower extremity   Side: Left lower extremity  Stability: No instability observed           Stability: No instability observed          Skin & Extremity Inspection: Skin color, temperature, and hair growth are WNL. No peripheral edema or cyanosis. No masses, redness, swelling, asymmetry, or associated skin lesions. No contractures.   Skin & Extremity Inspection: Skin color, temperature, and hair growth are WNL. No peripheral edema or cyanosis. No masses, redness, swelling, asymmetry, or associated skin lesions. No contractures.  Functional ROM: Pain restricted ROM for all joints of the lower extremity           Functional ROM: Pain restricted ROM for all joints of the lower extremity          Muscle Tone/Strength: Functionally intact. No obvious neuro-muscular anomalies detected.   Muscle Tone/Strength: Functionally intact. No obvious neuro-muscular anomalies detected.  Sensory (Neurological): Dermatomal pain pattern         Sensory (Neurological): Dermatomal pain pattern        DTR: Patellar: deferred today Achilles: deferred today Plantar: deferred today   DTR: Patellar: deferred today Achilles: deferred today Plantar: deferred today  Palpation: No palpable anomalies   Palpation: No palpable anomalies    Assessment   Diagnosis Status  1. Lumbar radiculopathy   2. Spinal stenosis, lumbar region, with neurogenic claudication   3. Lumbar facet arthropathy   4. Lumbar spondylosis   5. Chronic pain syndrome     Persistent Persistent Persistent    Plan of Care  Problem-specific:  No problem-specific Assessment & Plan notes found for this encounter.  [name] has a current medication list which includes the following long-term medication(s): amitriptyline, amlodipine, duloxetine, gabapentin, losartan, metoprolol succinate, pantoprazole, promethazine, and sucralfate.  Pharmacotherapy (Medications Ordered): Meds  ordered this encounter  Medications   oxyCODONE-acetaminophen (PERCOCET) 10-325 MG tablet    Sig: Take 1 tablet by mouth every 4 (four) hours as needed for pain.    Dispense:  150 tablet    Refill:  0   oxyCODONE-acetaminophen (PERCOCET) 10-325 MG tablet    Sig: Take 1 tablet by mouth every 4 (four) hours as needed for pain.    Dispense:  150 tablet    Refill:  0   oxyCODONE-acetaminophen (PERCOCET) 10-325 MG tablet    Sig: Take 1 tablet by mouth every 4 (four) hours as needed for pain.    Dispense:  150 tablet    Refill:  0   Orders Placed This Encounter  Procedures   ToxASSURE Select 13 (MW), Urine    Volume: 30 ml(s). Minimum 3 ml of urine is needed. Document temperature of fresh sample.  Indications: Long term (current) use of opiate analgesic (W09.811)    Order Specific Question:   Release to patient    Answer:   Immediate    Continue with SCS Continue multimodal analgesics with amitriptyline, gabapentin as well as acetaminophen.  Follow-up plan:   Return in about 3 months (around 04/13/2023) for MM, F2F.      Recent Visits Date Type Provider Dept  10/13/22 Office Visit Edward Jolly, MD [facility]  Showing recent visits within past 90 days and meeting all other requirements Today's Visits Date Type Provider Dept  01/11/23 Office Visit Edward Jolly, MD [facility]  Showing today's visits and meeting all other requirements Future Appointments No visits were found meeting these conditions. Showing future appointments within next 90 days and meeting all other requirements  I discussed the assessment and treatment plan with the patient. The patient was provided an opportunity to ask questions and all were answered. The patient agreed with the plan and demonstrated an understanding of the instructions.  Patient advised to call back or seek an in-person evaluation if the symptoms or condition worsens.  Duration of encounter: .  Total time  on encounter, as per AMA guidelines included both the face-to-face and non-face-to-face time personally spent by the physician and/or other qualified health care professional(s) on the day of the encounter (includes time in activities that require the physician or other qualified health care professional and does not include time in activities normally performed by clinical staff). Physician's time may include the following activities when performed: Preparing to see the patient (e.g., pre-charting review of records, searching for previously ordered imaging, lab work, and nerve conduction tests) Review of prior analgesic pharmacotherapies. Reviewing PMP Interpreting ordered tests (e.g., lab work, imaging, nerve conduction tests) Performing post-procedure evaluations, including interpretation of diagnostic procedures Obtaining and/or reviewing separately obtained history Performing a medically appropriate examination and/or evaluation Counseling and educating the patient/family/caregiver Ordering medications, tests, or procedures Referring and communicating with other health care professionals (when not separately reported) Documenting clinical information in the electronic or other health record Independently interpreting results (not separately reported) and communicating results to the patient/ family/caregiver Care coordination (not separately reported)  Note by: Edward Jolly, MD Date: 01/11/2023; Time: 9:59 AM

## 2023-01-11 NOTE — Progress Notes (Deleted)
PROVIDER NOTE: Information contained herein reflects review and annotations entered in association with encounter. Interpretation of such information and data should be left to medically-trained personnel. Information provided to patient can be located elsewhere in the medical record under "Patient Instructions". Document created using STT-dictation technology, any transcriptional errors that may result from process are unintentional.    Patient: [name]  Service Category: E/M  Provider: Edward Jolly, MD  DOB: 1940-07-02  DOS: 01/11/2023  Referring Provider: Sherlene Shams, MD  MRN: [id number]  Specialty: Interventional Pain Management  PCP: Sherlene Shams, MD  Type: Established Patient  Setting: Ambulatory outpatient    Location: Office  Delivery: Face-to-face     HPI  [name], a 82 y.o. year old female, is here today because of her No primary diagnosis found.. [name] primary complain today is Other (Rectal pain )  Pertinent problems: [name] has Spinal stenosis of thoracolumbar region; Spinal stenosis in cervical region; Spinal stenosis, lumbar region, with neurogenic claudication; Lumbar spondylosis; SI joint arthritis (HCC); Lumbar degenerative disc disease; Chronic pain syndrome; Lumbar radiculopathy; Lumbar facet arthropathy; and Back pain on their pertinent problem list. Pain Assessment: Severity of Chronic pain is reported as a 10-Worst pain ever/10. Location: Rectum Other (Comment) (no laterality)/into lower buttocks and upper thighs. Onset: More than a month ago. Quality: Discomfort, Constant. Timing: Constant. Modifying factor(s): pain medications. Vitals:  height is 5\' 8"  (1.727 m) and weight is 115 lb (52.2 kg). Her temporal temperature is 97.2 F (36.2 C) (abnormal). Her blood pressure is 154/73 (abnormal) and her pulse is 88. Her respiration is 16 and oxygen saturation is 100%.  BMI: Estimated body mass index is 17.49 kg/m as calculated from the following:   Height  as of this encounter: 5\' 8"  (1.727 m).   Weight as of this encounter: 115 lb (52.2 kg). Last encounter: 10/13/22 PROVIDER NOTE: Information contained herein reflects review and annotations entered in association with encounter. Interpretation of such information and data should be left to medically-trained personnel. Information provided to patient can be located elsewhere in the medical record under "Patient Instructions". Document created using STT-dictation technology, any transcriptional errors that may result from process are unintentional.    Patient: [name]  Service Category: E/M  Provider: Edward Jolly, MD  DOB: [birth year]  DOS: 01/11/2023  Referring Provider: Sherlene Shams, MD  MRN: [id number]  Specialty: Interventional Pain Management  PCP: Sherlene Shams, MD  Type: Established Patient  Setting: Ambulatory outpatient    Location: Office  Delivery: Face-to-face     HPI  [name], a 82 y.o. year old female, is here today because of her No primary diagnosis found.. [name] primary complain today is Other (Rectal pain )  Pertinent problems: [name] has Spinal stenosis of thoracolumbar region; Spinal stenosis in cervical region; Spinal stenosis, lumbar region, with neurogenic claudication; Lumbar spondylosis; SI joint arthritis (HCC); Lumbar degenerative disc disease; Chronic pain syndrome; Lumbar radiculopathy; Lumbar facet arthropathy; and Back pain on their pertinent problem list. Pain Assessment: Severity of Chronic pain is reported as a 10-Worst pain ever/10. Location: Rectum Other (Comment) (no laterality)/into lower buttocks and upper thighs. Onset: More than a month ago. Quality: Discomfort, Constant. Timing: Constant. Modifying factor(s): pain medications. Vitals:  height is 5\' 8"  (1.727 m) and weight is 115 lb (52.2 kg). Her temporal temperature is 97.2 F (36.2 C) (abnormal). Her blood pressure is 154/73 (abnormal) and her pulse is 88. Her respiration is  16 and  oxygen saturation is 100%.  BMI: Estimated body mass index is 17.49 kg/m as calculated from the following:   Height as of this encounter: 5\' 8"  (1.727 m).   Weight as of this encounter: 115 lb (52.2 kg). Last encounter: 06/02/2022. Last procedure: 06/20/2022.  Reason for encounter: medication management.   No change in medical history since last visit.  Patient's pain is at baseline.  Patient continues multimodal pain regimen as prescribed.  States that it provides some pain relief and improvement in functional status. She is still inquiring about IT pain pump which I advised against She is also inquiring about morphine.  We had a long discussion about it and I do not recommend morphine for her condition given side effects associated with morphine.   Pharmacotherapy Assessment  Analgesic: Percocet 10 mg up to 5 times a day as needed for severe breakthrough pain, quantity 150/month; MME equals 90   Monitoring: [place] PMP: PDMP not reviewed this encounter.       Pharmacotherapy: No side-effects or adverse reactions reported. Compliance: No problems identified. Effectiveness: Clinically acceptable.  Bianca Ammons, RN  01/11/2023  8:57 AM  Sign when Signing Visit Nursing Pain Medication Assessment:  Safety precautions to be maintained throughout the outpatient stay will include: orient to surroundings, keep bed in low position, maintain call bell within reach at all times, provide assistance with transfer out of bed and ambulation.  Medication Inspection Compliance: Pill count conducted under aseptic conditions, in front of the patient. Neither the pills nor the bottle was removed from the patient's sight at any time. Once count was completed pills were immediately returned to the patient in their original bottle.  Medication: Oxycodone/APAP Pill/Patch Count:  38 of 150 pills remain Pill/Patch Appearance: Markings consistent with prescribed medication Bottle Appearance: Standard pharmacy  container. Clearly labeled. Filled Date: 09 / 29 / 2024 Last Medication intake:  Today    No results found for: "CBDTHCR" No results found for: "D8THCCBX" No results found for: "D9THCCBX"  UDS:  Summary  Date Value Ref Range Status  12/22/2021 Note  Final    Comment:    ==================================================================== [employer] Select 13 (MW) ==================================================================== Specimen Alert Note: Urinary creatinine is low; ability to detect some drugs may be compromised. Interpret results with caution. (Creatinine) ==================================================================== Test                             Result       Flag       Units  Drug Present and Declared for Prescription Verification   Oxycodone                      11927        EXPECTED   ng/mg creat   Oxymorphone                    13545        EXPECTED   ng/mg creat   Noroxycodone                   15409        EXPECTED   ng/mg creat   Noroxymorphone                 3791         EXPECTED   ng/mg creat    Sources of oxycodone are scheduled prescription medications.    Oxymorphone,  noroxycodone, and noroxymorphone are expected    metabolites of oxycodone. Oxymorphone is also available as a    scheduled prescription medication.  ==================================================================== Test                      Result    Flag   Units      Ref Range   Creatinine              11        LL     mg/dL      >=45 ==================================================================== Declared Medications:  The flagging and interpretation on this report are based on the  following declared medications.  Unexpected results may arise from  inaccuracies in the declared medications.   **Note: The testing scope of this panel includes these medications:   Oxycodone (Percocet)   **Note: The testing scope of this panel does not include the  following reported  medications:   Acetaminophen (Tylenol)  Acetaminophen (Percocet)  Amitriptyline (Elavil)  Amlodipine (Norvasc)  Gabapentin (Neurontin)  Losartan (Cozaar)  Menthol  Metoprolol (Toprol)  Pantoprazole (Protonix)  Polyethylene Glycol (MiraLAX)  Promethazine (Phenergan)  Sucralfate (Carafate) ==================================================================== For clinical consultation, please call [phone]. ====================================================================       ROS  Constitutional: Denies any fever or chills Gastrointestinal: No reported hemesis, hematochezia, vomiting, or acute GI distress Musculoskeletal:  low back, left hip and left groin pain, left leg pain>right leg pain, rectal pain Neurological: No reported episodes of acute onset apraxia, aphasia, dysarthria, agnosia, amnesia, paralysis, loss of coordination, or loss of consciousness  Medication Review  DULoxetine, Menthol (Topical Analgesic), acetaminophen, amLODipine, amitriptyline, celecoxib, gabapentin, losartan, metoprolol succinate, oxyCODONE-acetaminophen, pantoprazole, polyethylene glycol, promethazine, and sucralfate  History Review  Allergy: [name] has No Known Allergies. Drug: [name]  reports no history of drug use. Alcohol:  reports no history of alcohol use. Tobacco:  reports that she has been smoking cigarettes. She has a 28 pack-year smoking history. She has never used smokeless tobacco. Social: [name]  reports that she has been smoking cigarettes. She has a 28 pack-year smoking history. She has never used smokeless tobacco. She reports that she does not drink alcohol and does not use drugs. Medical:  has a past medical history of AICD (automatic cardioverter/defibrillator) present ([place] cardiologist--- dr Graciela Husbands ), Arthritis, Bladder cancer [facility]) (urologist-  dr Mena Goes), Chronic hyponatremia, Chronic low back pain with bilateral sciatica, Chronic systolic (congestive) heart  failure (HCC), 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CC), Coronary artery disease (cardiologist-  dr Lorine Bears), DDD (degenerative disc disease), thoracolumbar, Frequent urination, Full dentures, Gait instability, GERD (gastroesophageal reflux disease), History of iron deficiency anemia (11/2013), History of stomach ulcers (11/2013), Hypertension, LBBB (left bundle branch block), NICM (nonischemic cardiomyopathy) (HCC) (last echo 12-31-2015 ef 55-60%), Nocturia more than twice per night, S/P insertion of spinal cord stimulator (followed by [facility]---- Dr Letitia Caul (notes in care everywhere)), Scoliosis, Uses walker (02/18/2021), and Wears glasses. Surgical: [name]  has a past surgical history that includes Esophagogastroduodenoscopy (N/A, 11/28/2013); Transurethral resection of bladder tumor (N/A, 12/13/2013); Transurethral resection of bladder tumor (N/A, 01/17/2014); Cataract extraction w/ intraocular lens  implant, bilateral (Bilateral, right 12-2013 / left  02-2014); Cystoscopy with retrograde pyelogram, ureteroscopy and stent placement (Bilateral, 04/18/2014); Transurethral resection of bladder tumor (N/A, 04/18/2014); Lumbar disc surgery (1980's); bi-ventricular implantable cardioverter defibrillator (N/A, 05/07/2014); Cystoscopy w/ retrogrades (Bilateral, 01/06/2015); Cystoscopy with biopsy (N/A, 10/06/2015); Cardiac catheterization (03-03-2014  dr Lorine Bears   [facility] [place]); Transurethral resection of bladder (  1995); Cardiovascular stress test (11/25/2013); transthoracic echocardiogram (12/31/2015   dr Graciela Husbands); Cystoscopy with fulgeration (N/A, 06/30/2017); Spinal cord stimulator implant (10-09-2018   @[facility] ); Cystoscopy with biopsy (N/A, 02/12/2019); Transurethral resection of bladder tumor (N/A, 12/13/2019); Transurethral resection of bladder tumor (N/A, 10/06/2020); colonscopy (11/2020); Cystoscopy with biopsy (N/A, 03/19/2021); ICD GENERATOR CHANGEOUT (N/A, 11/04/2021); Biv pacemaker generator change out; and  Transurethral resection of bladder tumor (N/A, 11/30/2021). Family: family history includes Cancer in her brother and mother.  Laboratory Chemistry Profile   Renal Lab Results  Component Value Date   BUN 11 02/21/2022   CREATININE 0.68 02/21/2022   BCR 18 04/28/2014   GFR 81.41 02/16/2022   GFRAA >60 06/17/2019   GFRNONAA >60 02/21/2022    Hepatic Lab Results  Component Value Date   AST 38 02/21/2022   ALT 27 02/21/2022   ALBUMIN 3.8 02/21/2022   ALKPHOS 88 02/21/2022   LIPASE 30 11/23/2013    Electrolytes Lab Results  Component Value Date   NA 130 (L) 02/21/2022   K 4.9 02/21/2022   CL 95 (L) 02/21/2022   CALCIUM 9.4 02/21/2022   MG 2.3 06/03/2019    Bone Lab Results  Component Value Date   VD25OH 36.17 01/11/2019    Inflammation (CRP: Acute Phase) (ESR: Chronic Phase) No results found for: "CRP", "ESRSEDRATE", "LATICACIDVEN"       Note: Above Lab results reviewed.  Physical Exam  General appearance: Well nourished, well developed, and well hydrated. In no apparent acute distress Mental status: Alert, oriented x 3 (person, place, & time)       Respiratory: No evidence of acute respiratory distress Eyes: PERLA Vitals: BP (!) 154/73 (BP Location: Left Arm, Patient Position: Sitting, Cuff Size: Normal)   Pulse 88   Temp (!) 97.2 F (36.2 C) (Temporal)   Resp 16   Ht 5\' 8"  (1.727 m)   Wt 115 lb (52.2 kg)   SpO2 100%   BMI 17.49 kg/m  BMI: Estimated body mass index is 17.49 kg/m as calculated from the following:   Height as of this encounter: 5\' 8"  (1.727 m).   Weight as of this encounter: 115 lb (52.2 kg). Ideal: Ideal body weight: 63.9 kg (140 lb 14 oz)  Lumbar Exam  Skin & Axial Inspection: Well healed scar from previous spine surgery detected, IPG for SCS in place Alignment: Symmetrical Functional ROM: Pain restricted ROM       Stability: No instability detected Muscle Tone/Strength: Functionally intact. No obvious neuro-muscular anomalies  detected. Sensory (Neurological): Dermatomal pain pattern Palpation: No palpable anomalies           Gait & Posture Assessment  Ambulation: Patient ambulates using a walker Gait: Limited. Using assistive device to ambulate Posture: Difficulty standing up straight, due to pain    Lower Extremity Exam      Side: Right lower extremity   Side: Left lower extremity  Stability: No instability observed           Stability: No instability observed          Skin & Extremity Inspection: Skin color, temperature, and hair growth are WNL. No peripheral edema or cyanosis. No masses, redness, swelling, asymmetry, or associated skin lesions. No contractures.   Skin & Extremity Inspection: Skin color, temperature, and hair growth are WNL. No peripheral edema or cyanosis. No masses, redness, swelling, asymmetry, or associated skin lesions. No contractures.  Functional ROM: Pain restricted ROM for all joints of the lower extremity  Functional ROM: Pain restricted ROM for all joints of the lower extremity          Muscle Tone/Strength: Functionally intact. No obvious neuro-muscular anomalies detected.   Muscle Tone/Strength: Functionally intact. No obvious neuro-muscular anomalies detected.  Sensory (Neurological): Dermatomal pain pattern         Sensory (Neurological): Dermatomal pain pattern        DTR: Patellar: deferred today Achilles: deferred today Plantar: deferred today   DTR: Patellar: deferred today Achilles: deferred today Plantar: deferred today  Palpation: No palpable anomalies   Palpation: No palpable anomalies    Assessment   Diagnosis Status  No diagnosis found.  Persistent Persistent Persistent   Updated Problems: No problems updated.  Plan of Care  Problem-specific:  No problem-specific Assessment & Plan notes found for this encounter.  [name] has a current medication list which includes the following long-term medication(s): amitriptyline, amlodipine,  duloxetine, gabapentin, losartan, metoprolol succinate, pantoprazole, promethazine, and sucralfate.  Pharmacotherapy (Medications Ordered): No orders of the defined types were placed in this encounter. Continue multimodal analgesics with amitriptyline, gabapentin as well as acetaminophen.  Follow-up plan:   No follow-ups on file.      Recent Visits Date Type Provider Dept  10/13/22 Office Visit Edward Jolly, MD [facility]  Showing recent visits within past 90 days and meeting all other requirements Today's Visits Date Type Provider Dept  01/11/23 Office Visit Edward Jolly, MD [facility]  Showing today's visits and meeting all other requirements Future Appointments No visits were found meeting these conditions. Showing future appointments within next 90 days and meeting all other requirements  I discussed the assessment and treatment plan with the patient. The patient was provided an opportunity to ask questions and all were answered. The patient agreed with the plan and demonstrated an understanding of the instructions.  Patient advised to call back or seek an in-person evaluation if the symptoms or condition worsens.  Duration of encounter: .  Total time on encounter, as per AMA guidelines included both the face-to-face and non-face-to-face time personally spent by the physician and/or other qualified health care professional(s) on the day of the encounter (includes time in activities that require the physician or other qualified health care professional and does not include time in activities normally performed by clinical staff). Physician's time may include the following activities when performed: Preparing to see the patient (e.g., pre-charting review of records, searching for previously ordered imaging, lab work, and nerve conduction tests) Review of prior analgesic pharmacotherapies. Reviewing PMP Interpreting ordered tests (e.g., lab work,  imaging, nerve conduction tests) Performing post-procedure evaluations, including interpretation of diagnostic procedures Obtaining and/or reviewing separately obtained history Performing a medically appropriate examination and/or evaluation Counseling and educating the patient/family/caregiver Ordering medications, tests, or procedures Referring and communicating with other health care professionals (when not separately reported) Documenting clinical information in the electronic or other health record Independently interpreting results (not separately reported) and communicating results to the patient/ family/caregiver Care coordination (not separately reported)  Note by: Edward Jolly, MD Date: 01/11/2023; Time: 9:28 AM Last procedure: 06/20/2022.  Reason for encounter: medication management.   No change in medical history since last visit.  Patient's pain is at baseline.  Patient continues multimodal pain regimen as prescribed.  States that it provides some pain relief and improvement in functional status. She is still inquiring about IT pain pump which I advised against She is also inquiring about morphine.  We had a long discussion  about it and I do not recommend morphine for her condition given side effects associated with morphine.   Pharmacotherapy Assessment  Analgesic: Percocet 10 mg up to 5 times a day as needed for severe breakthrough pain, quantity 150/month; MME equals 90   Monitoring: [place] PMP: PDMP not reviewed this encounter.       Pharmacotherapy: No side-effects or adverse reactions reported. Compliance: No problems identified. Effectiveness: Clinically acceptable.  Bianca Ammons, RN  01/11/2023  8:57 AM  Sign when Signing Visit Nursing Pain Medication Assessment:  Safety precautions to be maintained throughout the outpatient stay will include: orient to surroundings, keep bed in low position, maintain call bell within reach at all times, provide assistance with  transfer out of bed and ambulation.  Medication Inspection Compliance: Pill count conducted under aseptic conditions, in front of the patient. Neither the pills nor the bottle was removed from the patient's sight at any time. Once count was completed pills were immediately returned to the patient in their original bottle.  Medication: Oxycodone/APAP Pill/Patch Count:  38 of 150 pills remain Pill/Patch Appearance: Markings consistent with prescribed medication Bottle Appearance: Standard pharmacy container. Clearly labeled. Filled Date: 09 / 29 / 2024 Last Medication intake:  Today    No results found for: "CBDTHCR" No results found for: "D8THCCBX" No results found for: "D9THCCBX"  UDS:  Summary  Date Value Ref Range Status  12/22/2021 Note  Final    Comment:    ==================================================================== [employer] Select 13 (MW) ==================================================================== Specimen Alert Note: Urinary creatinine is low; ability to detect some drugs may be compromised. Interpret results with caution. (Creatinine) ==================================================================== Test                             Result       Flag       Units  Drug Present and Declared for Prescription Verification   Oxycodone                      11927        EXPECTED   ng/mg creat   Oxymorphone                    13545        EXPECTED   ng/mg creat   Noroxycodone                   15409        EXPECTED   ng/mg creat   Noroxymorphone                 3791         EXPECTED   ng/mg creat    Sources of oxycodone are scheduled prescription medications.    Oxymorphone, noroxycodone, and noroxymorphone are expected    metabolites of oxycodone. Oxymorphone is also available as a    scheduled prescription medication.  ==================================================================== Test                      Result    Flag   Units      Ref Range    Creatinine              11        LL     mg/dL      >=78 ==================================================================== Declared Medications:  The flagging and interpretation on this report are based on the  following  declared medications.  Unexpected results may arise from  inaccuracies in the declared medications.   **Note: The testing scope of this panel includes these medications:   Oxycodone (Percocet)   **Note: The testing scope of this panel does not include the  following reported medications:   Acetaminophen (Tylenol)  Acetaminophen (Percocet)  Amitriptyline (Elavil)  Amlodipine (Norvasc)  Gabapentin (Neurontin)  Losartan (Cozaar)  Menthol  Metoprolol (Toprol)  Pantoprazole (Protonix)  Polyethylene Glycol (MiraLAX)  Promethazine (Phenergan)  Sucralfate (Carafate) ==================================================================== For clinical consultation, please call [phone]. ====================================================================       ROS  Constitutional: Denies any fever or chills Gastrointestinal: No reported hemesis, hematochezia, vomiting, or acute GI distress Musculoskeletal:  low back, left hip and left groin pain, left leg pain>right leg pain, rectal pain Neurological: No reported episodes of acute onset apraxia, aphasia, dysarthria, agnosia, amnesia, paralysis, loss of coordination, or loss of consciousness  Medication Review  DULoxetine, Menthol (Topical Analgesic), acetaminophen, amLODipine, amitriptyline, celecoxib, gabapentin, losartan, metoprolol succinate, oxyCODONE-acetaminophen, pantoprazole, polyethylene glycol, promethazine, and sucralfate  History Review  Allergy: [name] has No Known Allergies. Drug: [name]  reports no history of drug use. Alcohol:  reports no history of alcohol use. Tobacco:  reports that she has been smoking cigarettes. She has a 28 pack-year smoking history. She has never used smokeless  tobacco. Social: [name]  reports that she has been smoking cigarettes. She has a 28 pack-year smoking history. She has never used smokeless tobacco. She reports that she does not drink alcohol and does not use drugs. Medical:  has a past medical history of AICD (automatic cardioverter/defibrillator) present ([place] cardiologist--- dr Graciela Husbands ), Arthritis, Bladder cancer [facility]) (urologist-  dr Mena Goes), Chronic hyponatremia, Chronic low back pain with bilateral sciatica, Chronic systolic (congestive) heart failure (HCC), 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CC), Coronary artery disease (cardiologist-  dr Lorine Bears), DDD (degenerative disc disease), thoracolumbar, Frequent urination, Full dentures, Gait instability, GERD (gastroesophageal reflux disease), History of iron deficiency anemia (11/2013), History of stomach ulcers (11/2013), Hypertension, LBBB (left bundle branch block), NICM (nonischemic cardiomyopathy) (HCC) (last echo 12-31-2015 ef 55-60%), Nocturia more than twice per night, S/P insertion of spinal cord stimulator (followed by [facility]---- Dr Letitia Caul (notes in care everywhere)), Scoliosis, Uses walker (02/18/2021), and Wears glasses. Surgical: [name]  has a past surgical history that includes Esophagogastroduodenoscopy (N/A, 11/28/2013); Transurethral resection of bladder tumor (N/A, 12/13/2013); Transurethral resection of bladder tumor (N/A, 01/17/2014); Cataract extraction w/ intraocular lens  implant, bilateral (Bilateral, right 12-2013 / left  02-2014); Cystoscopy with retrograde pyelogram, ureteroscopy and stent placement (Bilateral, 04/18/2014); Transurethral resection of bladder tumor (N/A, 04/18/2014); Lumbar disc surgery (1980's); bi-ventricular implantable cardioverter defibrillator (N/A, 05/07/2014); Cystoscopy w/ retrogrades (Bilateral, 01/06/2015); Cystoscopy with biopsy (N/A, 10/06/2015); Cardiac catheterization (03-03-2014  dr Lorine Bears   [facility]); Transurethral resection of bladder  (1995); Cardiovascular stress test (11/25/2013); transthoracic echocardiogram (12/31/2015   dr Graciela Husbands); Cystoscopy with fulgeration (N/A, 06/30/2017); Spinal cord stimulator implant (10-09-2018   @[facility] ); Cystoscopy with biopsy (N/A, 02/12/2019); Transurethral resection of bladder tumor (N/A, 12/13/2019); Transurethral resection of bladder tumor (N/A, 10/06/2020); colonscopy (11/2020); Cystoscopy with biopsy (N/A, 03/19/2021); ICD GENERATOR CHANGEOUT (N/A, 11/04/2021); Biv pacemaker generator change out; and Transurethral resection of bladder tumor (N/A, 11/30/2021). Family: family history includes Cancer in her brother and mother.  Laboratory Chemistry Profile   Renal Lab Results  Component Value Date   BUN 11 02/21/2022   CREATININE 0.68 02/21/2022   BCR 18 04/28/2014   GFR 81.41 02/16/2022   GFRAA >  60 06/17/2019   GFRNONAA >60 02/21/2022    Hepatic Lab Results  Component Value Date   AST 38 02/21/2022   ALT 27 02/21/2022   ALBUMIN 3.8 02/21/2022   ALKPHOS 88 02/21/2022   LIPASE 30 11/23/2013    Electrolytes Lab Results  Component Value Date   NA 130 (L) 02/21/2022   K 4.9 02/21/2022   CL 95 (L) 02/21/2022   CALCIUM 9.4 02/21/2022   MG 2.3 06/03/2019    Bone Lab Results  Component Value Date   VD25OH 36.17 01/11/2019    Inflammation (CRP: Acute Phase) (ESR: Chronic Phase) No results found for: "CRP", "ESRSEDRATE", "LATICACIDVEN"       Note: Above Lab results reviewed.  Physical Exam  General appearance: Well nourished, well developed, and well hydrated. In no apparent acute distress Mental status: Alert, oriented x 3 (person, place, & time)       Respiratory: No evidence of acute respiratory distress Eyes: PERLA Vitals: BP (!) 154/73 (BP Location: Left Arm, Patient Position: Sitting, Cuff Size: Normal)   Pulse 88   Temp (!) 97.2 F (36.2 C) (Temporal)   Resp 16   Ht 5\' 8"  (1.727 m)   Wt 115 lb (52.2 kg)   SpO2 100%   BMI 17.49 kg/m  BMI: Estimated body mass  index is 17.49 kg/m as calculated from the following:   Height as of this encounter: 5\' 8"  (1.727 m).   Weight as of this encounter: 115 lb (52.2 kg). Ideal: Ideal body weight: 63.9 kg (140 lb 14 oz)  Lumbar Exam  Skin & Axial Inspection: Well healed scar from previous spine surgery detected, IPG for SCS in place Alignment: Symmetrical Functional ROM: Pain restricted ROM       Stability: No instability detected Muscle Tone/Strength: Functionally intact. No obvious neuro-muscular anomalies detected. Sensory (Neurological): Dermatomal pain pattern Palpation: No palpable anomalies           Gait & Posture Assessment  Ambulation: Patient ambulates using a walker Gait: Limited. Using assistive device to ambulate Posture: Difficulty standing up straight, due to pain    Lower Extremity Exam      Side: Right lower extremity   Side: Left lower extremity  Stability: No instability observed           Stability: No instability observed          Skin & Extremity Inspection: Skin color, temperature, and hair growth are WNL. No peripheral edema or cyanosis. No masses, redness, swelling, asymmetry, or associated skin lesions. No contractures.   Skin & Extremity Inspection: Skin color, temperature, and hair growth are WNL. No peripheral edema or cyanosis. No masses, redness, swelling, asymmetry, or associated skin lesions. No contractures.  Functional ROM: Pain restricted ROM for all joints of the lower extremity           Functional ROM: Pain restricted ROM for all joints of the lower extremity          Muscle Tone/Strength: Functionally intact. No obvious neuro-muscular anomalies detected.   Muscle Tone/Strength: Functionally intact. No obvious neuro-muscular anomalies detected.  Sensory (Neurological): Dermatomal pain pattern         Sensory (Neurological): Dermatomal pain pattern        DTR: Patellar: deferred today Achilles: deferred today Plantar: deferred today   DTR: Patellar: deferred  today Achilles: deferred today Plantar: deferred today  Palpation: No palpable anomalies   Palpation: No palpable anomalies    Assessment   Diagnosis Status  No diagnosis  found.  Persistent Persistent Persistent   Updated Problems: No problems updated.  Plan of Care  Problem-specific:  No problem-specific Assessment & Plan notes found for this encounter.  [name] has a current medication list which includes the following long-term medication(s): amitriptyline, amlodipine, duloxetine, gabapentin, losartan, metoprolol succinate, pantoprazole, promethazine, and sucralfate.  Pharmacotherapy (Medications Ordered): No orders of the defined types were placed in this encounter. Continue multimodal analgesics with amitriptyline, gabapentin as well as acetaminophen.  Follow-up plan:   No follow-ups on file.      Recent Visits Date Type Provider Dept  10/13/22 Office Visit Edward Jolly, MD [facility]  Showing recent visits within past 90 days and meeting all other requirements Today's Visits Date Type Provider Dept  01/11/23 Office Visit Edward Jolly, MD [facility]  Showing today's visits and meeting all other requirements Future Appointments No visits were found meeting these conditions. Showing future appointments within next 90 days and meeting all other requirements  I discussed the assessment and treatment plan with the patient. The patient was provided an opportunity to ask questions and all were answered. The patient agreed with the plan and demonstrated an understanding of the instructions.  Patient advised to call back or seek an in-person evaluation if the symptoms or condition worsens.  Duration of encounter: .  Total time on encounter, as per AMA guidelines included both the face-to-face and non-face-to-face time personally spent by the physician and/or other qualified health care professional(s) on the day of the encounter  (includes time in activities that require the physician or other qualified health care professional and does not include time in activities normally performed by clinical staff). Physician's time may include the following activities when performed: Preparing to see the patient (e.g., pre-charting review of records, searching for previously ordered imaging, lab work, and nerve conduction tests) Review of prior analgesic pharmacotherapies. Reviewing PMP Interpreting ordered tests (e.g., lab work, imaging, nerve conduction tests) Performing post-procedure evaluations, including interpretation of diagnostic procedures Obtaining and/or reviewing separately obtained history Performing a medically appropriate examination and/or evaluation Counseling and educating the patient/family/caregiver Ordering medications, tests, or procedures Referring and communicating with other health care professionals (when not separately reported) Documenting clinical information in the electronic or other health record Independently interpreting results (not separately reported) and communicating results to the patient/ family/caregiver Care coordination (not separately reported)  Note by: Edward Jolly, MD Date: 01/11/2023; Time: 9:28 AM

## 2023-01-14 LAB — TOXASSURE SELECT 13 (MW), URINE

## 2023-01-20 ENCOUNTER — Ambulatory Visit
Admission: RE | Admit: 2023-01-20 | Discharge: 2023-01-20 | Disposition: A | Discharge status: Home / Self Care | Payer: Medicare Other | Source: Ambulatory Visit | Attending: Urology | Admitting: Urology

## 2023-01-20 DIAGNOSIS — Z8551 Personal history of malignant neoplasm of bladder: Secondary | ICD-10-CM | POA: Diagnosis not present

## 2023-01-20 DIAGNOSIS — C679 Malignant neoplasm of bladder, unspecified: Secondary | ICD-10-CM | POA: Diagnosis not present

## 2023-01-20 DIAGNOSIS — K5730 Diverticulosis of large intestine without perforation or abscess without bleeding: Secondary | ICD-10-CM | POA: Diagnosis not present

## 2023-01-20 LAB — POCT I-STAT CREATININE: Creatinine, Ser: 0.7 mg/dL (ref 0.44–1.00)

## 2023-01-20 MED ORDER — IOHEXOL 300 MG/ML  SOLN
80.0000 mL | Freq: Once | INTRAMUSCULAR | Status: AC | PRN
  Administered 2023-01-20: 80 mL via INTRAVENOUS

## 2023-01-23 ENCOUNTER — Ambulatory Visit: Payer: Medicare Other

## 2023-01-31 ENCOUNTER — Encounter: Payer: Medicare Other | Admitting: Student in an Organized Health Care Education/Training Program

## 2023-02-06 ENCOUNTER — Ambulatory Visit (INDEPENDENT_AMBULATORY_CARE_PROVIDER_SITE_OTHER): Payer: Medicare Other

## 2023-02-06 DIAGNOSIS — I428 Other cardiomyopathies: Secondary | ICD-10-CM

## 2023-02-06 DIAGNOSIS — I5022 Chronic systolic (congestive) heart failure: Secondary | ICD-10-CM

## 2023-02-07 LAB — CUP PACEART REMOTE DEVICE CHECK
Battery Remaining Longevity: 77 mo
Battery Voltage: 2.99 V
Brady Statistic AP VP Percent: 34.92 %
Brady Statistic AP VS Percent: 0.95 %
Brady Statistic AS VP Percent: 60.42 %
Brady Statistic AS VS Percent: 3.71 %
Brady Statistic RA Percent Paced: 32.82 %
Brady Statistic RV Percent Paced: 88.25 %
Date Time Interrogation Session: 20241118001603
HighPow Impedance: 68 Ohm
Implantable Lead Connection Status: 753985
Implantable Lead Connection Status: 753985
Implantable Lead Connection Status: 753985
Implantable Lead Implant Date: 20160217
Implantable Lead Implant Date: 20160217
Implantable Lead Implant Date: 20160217
Implantable Lead Location: 753858
Implantable Lead Location: 753859
Implantable Lead Location: 753860
Implantable Lead Model: 4298
Implantable Lead Model: 5076
Implantable Pulse Generator Implant Date: 20230817
Lead Channel Impedance Value: 218.5 Ohm
Lead Channel Impedance Value: 223.149
Lead Channel Impedance Value: 250.87 Ohm
Lead Channel Impedance Value: 250.87 Ohm
Lead Channel Impedance Value: 257.018
Lead Channel Impedance Value: 380 Ohm
Lead Channel Impedance Value: 437 Ohm
Lead Channel Impedance Value: 437 Ohm
Lead Channel Impedance Value: 437 Ohm
Lead Channel Impedance Value: 456 Ohm
Lead Channel Impedance Value: 532 Ohm
Lead Channel Impedance Value: 589 Ohm
Lead Channel Impedance Value: 589 Ohm
Lead Channel Impedance Value: 779 Ohm
Lead Channel Impedance Value: 779 Ohm
Lead Channel Impedance Value: 950 Ohm
Lead Channel Impedance Value: 950 Ohm
Lead Channel Impedance Value: 969 Ohm
Lead Channel Pacing Threshold Amplitude: 0.5 V
Lead Channel Pacing Threshold Amplitude: 0.875 V
Lead Channel Pacing Threshold Amplitude: 1.5 V
Lead Channel Pacing Threshold Pulse Width: 0.4 ms
Lead Channel Pacing Threshold Pulse Width: 0.4 ms
Lead Channel Pacing Threshold Pulse Width: 0.4 ms
Lead Channel Sensing Intrinsic Amplitude: 2.75 mV
Lead Channel Sensing Intrinsic Amplitude: 2.75 mV
Lead Channel Sensing Intrinsic Amplitude: 20.5 mV
Lead Channel Sensing Intrinsic Amplitude: 20.5 mV
Lead Channel Setting Pacing Amplitude: 1.5 V
Lead Channel Setting Pacing Amplitude: 2 V
Lead Channel Setting Pacing Amplitude: 2 V
Lead Channel Setting Pacing Pulse Width: 0.4 ms
Lead Channel Setting Pacing Pulse Width: 0.4 ms
Lead Channel Setting Sensing Sensitivity: 0.45 mV
Zone Setting Status: 755011
Zone Setting Status: 755011

## 2023-02-10 ENCOUNTER — Other Ambulatory Visit: Payer: Self-pay | Admitting: Internal Medicine

## 2023-02-10 DIAGNOSIS — M5416 Radiculopathy, lumbar region: Secondary | ICD-10-CM

## 2023-02-10 DIAGNOSIS — M47816 Spondylosis without myelopathy or radiculopathy, lumbar region: Secondary | ICD-10-CM

## 2023-02-10 DIAGNOSIS — G894 Chronic pain syndrome: Secondary | ICD-10-CM

## 2023-02-27 ENCOUNTER — Other Ambulatory Visit: Payer: Self-pay | Admitting: Student

## 2023-02-27 DIAGNOSIS — I420 Dilated cardiomyopathy: Secondary | ICD-10-CM

## 2023-02-28 ENCOUNTER — Ambulatory Visit (INDEPENDENT_AMBULATORY_CARE_PROVIDER_SITE_OTHER): Payer: Medicare Other

## 2023-02-28 DIAGNOSIS — Z23 Encounter for immunization: Secondary | ICD-10-CM | POA: Diagnosis not present

## 2023-02-28 NOTE — Progress Notes (Signed)
Patient presented for High Dose Flu vaccine to left deltoid, patient voiced no concerns nor showed any signs of distress during injection

## 2023-03-02 NOTE — Addendum Note (Signed)
Addended by: Geralyn Flash D on: 03/02/2023 10:48 AM   Modules accepted: Orders

## 2023-03-02 NOTE — Progress Notes (Signed)
Remote ICD transmission.   

## 2023-03-08 ENCOUNTER — Other Ambulatory Visit: Payer: Self-pay | Admitting: Internal Medicine

## 2023-03-20 ENCOUNTER — Other Ambulatory Visit: Payer: Self-pay | Admitting: Internal Medicine

## 2023-03-20 ENCOUNTER — Ambulatory Visit: Payer: Medicare Other | Admitting: Internal Medicine

## 2023-03-20 DIAGNOSIS — I493 Ventricular premature depolarization: Secondary | ICD-10-CM

## 2023-03-20 DIAGNOSIS — R001 Bradycardia, unspecified: Secondary | ICD-10-CM

## 2023-03-20 DIAGNOSIS — I5022 Chronic systolic (congestive) heart failure: Secondary | ICD-10-CM

## 2023-03-20 DIAGNOSIS — I447 Left bundle-branch block, unspecified: Secondary | ICD-10-CM

## 2023-03-20 DIAGNOSIS — I428 Other cardiomyopathies: Secondary | ICD-10-CM

## 2023-03-20 DIAGNOSIS — Z95810 Presence of automatic (implantable) cardiac defibrillator: Secondary | ICD-10-CM

## 2023-04-03 ENCOUNTER — Other Ambulatory Visit: Payer: Self-pay | Admitting: Internal Medicine

## 2023-04-06 ENCOUNTER — Encounter: Payer: Self-pay | Admitting: Oncology

## 2023-04-12 ENCOUNTER — Other Ambulatory Visit: Payer: Self-pay | Admitting: Internal Medicine

## 2023-04-13 ENCOUNTER — Ambulatory Visit
Payer: Medicare Other | Attending: Student in an Organized Health Care Education/Training Program | Admitting: Student in an Organized Health Care Education/Training Program

## 2023-04-13 DIAGNOSIS — G894 Chronic pain syndrome: Secondary | ICD-10-CM

## 2023-04-13 DIAGNOSIS — M48062 Spinal stenosis, lumbar region with neurogenic claudication: Secondary | ICD-10-CM

## 2023-04-13 DIAGNOSIS — M47816 Spondylosis without myelopathy or radiculopathy, lumbar region: Secondary | ICD-10-CM

## 2023-04-13 DIAGNOSIS — M5416 Radiculopathy, lumbar region: Secondary | ICD-10-CM

## 2023-04-13 MED ORDER — OXYCODONE-ACETAMINOPHEN 10-325 MG PO TABS: 1.0000 | ORAL_TABLET | ORAL | 0 refills | Status: AC | PRN

## 2023-04-13 NOTE — Progress Notes (Signed)
Patient: [name]  Service Category: E/M  Provider: Edward Jolly, MD  DOB: 1940/12/29  DOS: 04/13/2023  Location: Office  MRN: [id number]  Setting: Ambulatory outpatient  Referring Provider: Sherlene Shams, MD  Type: Established Patient  Specialty: Interventional Pain Management  PCP: Sherlene Shams, MD  Location: Remote location  Delivery: TeleHealth     Virtual Encounter - Pain Management PROVIDER NOTE: Information contained herein reflects review and annotations entered in association with encounter. Interpretation of such information and data should be left to medically-trained personnel. Information provided to patient can be located elsewhere in the medical record under "Patient Instructions". Document created using STT-dictation technology, any transcriptional errors that may result from process are unintentional.    Contact & Pharmacy Preferred: [phone] Home: [phone] (home) Mobile: [phone] (mobile) E-mail: [name] [place], [place] - [address] AT [facility] OF [address] [place] [address] [place] [address] Phone: [phone] Fax: [phone]  [id number] - [place], [place] - [address] AT [facility] OF [address] [place] [address] Phone: [phone] Fax: [phone]   Pre-screening  [name] offered "in-person" vs "virtual" encounter. She indicated preferring virtual for this encounter.   Reason COVID-19*  Social distancing based on CDC and AMA recommendations.   I contacted [name] on 04/13/2023 via telephone.      I clearly identified myself as Edward Jolly, MD. I verified that I was speaking with the correct person using two identifiers (Name: [name], and date of birth: Feb 28, 1941).  Consent I sought verbal advanced consent from [name] for virtual visit interactions. I informed [name] of possible security and  privacy concerns, risks, and limitations associated with providing "not-in-person" medical evaluation and management services. I also informed [name] of the availability of "in-person" appointments. Finally, I informed her that there would be a charge for the virtual visit and that she could be  personally, fully or partially, financially responsible for it. [name] expressed understanding and agreed to proceed.   Historic Elements   [name] is a 83 y.o. year old, female patient evaluated today after our last contact on 01/11/2023. [name]  has a past medical history of AICD (automatic cardioverter/defibrillator) present (EP cardiologist--- dr Graciela Husbands ), Arthritis, Bladder cancer [facility]) (urologist-  dr Mena Goes), Chronic hyponatremia, Chronic low back pain with bilateral sciatica, Chronic systolic (congestive) heart failure (HCC), 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CC), Coronary artery disease (cardiologist-  dr Lorine Bears), DDD (degenerative disc disease), thoracolumbar, Frequent urination, Full dentures, Gait instability, GERD (gastroesophageal reflux disease), History of iron deficiency anemia (11/2013), History of stomach ulcers (11/2013), Hypertension, LBBB (left bundle branch block), NICM (nonischemic cardiomyopathy) (HCC) (last echo 12-31-2015 ef 55-60%), Nocturia more than twice per night, S/P insertion of spinal cord stimulator (followed by [facility]---- Dr Letitia Caul (notes in care everywhere)), Scoliosis, Uses walker (02/18/2021), and Wears glasses. She also  has a past surgical history that includes Esophagogastroduodenoscopy (N/A, 11/28/2013); Transurethral resection of bladder tumor (N/A, 12/13/2013); Transurethral resection of bladder tumor (N/A, 01/17/2014); Cataract extraction w/ intraocular lens  implant, bilateral (Bilateral, right 12-2013 / left  02-2014); Cystoscopy with retrograde pyelogram, ureteroscopy and stent placement (Bilateral, 04/18/2014); Transurethral resection of bladder  tumor (N/A, 04/18/2014); Lumbar disc surgery (1980's); bi-ventricular implantable cardioverter defibrillator (N/A, 05/07/2014); Cystoscopy w/ retrogrades (Bilateral, 01/06/2015); Cystoscopy with biopsy (N/A, 10/06/2015); Cardiac catheterization (03-03-2014  dr Kirke Corin  muhammad   [facility]); Transurethral resection of bladder (1995); Cardiovascular stress test (11/25/2013); transthoracic echocardiogram (12/31/2015   dr Graciela Husbands); Cystoscopy with fulgeration (N/A, 06/30/2017); Spinal cord stimulator implant (10-09-2018   @[facility] ); Cystoscopy with biopsy (N/A, 02/12/2019); Transurethral resection of bladder tumor (N/A, 12/13/2019); Transurethral resection of bladder tumor (N/A, 10/06/2020); colonscopy (11/2020); Cystoscopy with biopsy (N/A, 03/19/2021); ICD GENERATOR CHANGEOUT (N/A, 11/04/2021); Biv pacemaker generator change out; and Transurethral resection of bladder tumor (N/A, 11/30/2021). [name] has a current medication list which includes the following prescription(s): acetaminophen, amitriptyline, amlodipine, celecoxib, duloxetine, gabapentin, losartan, menthol (topical analgesic), metoprolol succinate, pantoprazole, polyethylene glycol, promethazine, sucralfate, and [START ON 04/17/2023] oxycodone-acetaminophen, and the following Facility-Administered Medications: epirubicin, gemcitabine, and gemcitabine. She  reports that she has been smoking cigarettes. She has a 28 pack-year smoking history. She has never used smokeless tobacco. She reports that she does not drink alcohol and does not use drugs. [name] has no known allergies.  BMI: Estimated body mass index is 17.49 kg/m as calculated from the following:   Height as of 01/11/23: 5\' 8"  (1.727 m).   Weight as of 01/11/23: 115 lb (52.2 kg). Last encounter: 01/11/2023. Last procedure: 06/20/2022.  HPI  Today, she is being contacted for medication management.  patient called stating she is afraid to try getting out of the house due to ice/snow. her balance is bad  and afraid of falling. Changed to VV for med refill x 1 month Refill of Percocet as below, has been on this long term, no change in dose   Pharmacotherapy Assessment   Opioid Analgesic: Percocet 10 mg up to 5 times a day as needed for severe breakthrough pain, quantity 150/month; MME equals 90   Monitoring: [place] PMP: PDMP reviewed during this encounter.       Pharmacotherapy: No side-effects or adverse reactions reported. Compliance: No problems identified. Effectiveness: Clinically acceptable. Plan: Refer to "POC". UDS:  Summary  Date Value Ref Range Status  01/11/2023 Note  Final    Comment:    ==================================================================== [employer] Select 13 (MW) ==================================================================== Test                             Result       Flag       Units  Drug Present and Declared for Prescription Verification   Oxycodone                      6116         EXPECTED   ng/mg creat   Oxymorphone                    5905         EXPECTED   ng/mg creat   Noroxycodone                   22714        EXPECTED   ng/mg creat   Noroxymorphone                 4419         EXPECTED   ng/mg creat    Sources of oxycodone are scheduled prescription medications.    Oxymorphone, noroxycodone, and noroxymorphone are expected    metabolites of oxycodone. Oxymorphone is also available as a    scheduled prescription medication.  ==================================================================== Test  Result    Flag   Units      Ref Range   Creatinine              37               mg/dL      >=16 ==================================================================== Declared Medications:  The flagging and interpretation on this report are based on the  following declared medications.  Unexpected results may arise from  inaccuracies in the declared medications.   **Note: The testing scope of this panel includes these  medications:   Oxycodone (Percocet)   **Note: The testing scope of this panel does not include the  following reported medications:   Acetaminophen (Tylenol)  Acetaminophen (Percocet)  Amitriptyline (Elavil)  Amlodipine (Norvasc)  Celecoxib (Celebrex)  Gabapentin  Losartan (Cozaar)  Menthol  Metoprolol (Toprol)  Pantoprazole (Protonix)  Polyethylene Glycol (MiraLAX)  Promethazine (Phenergan)  Sucralfate (Carafate) ==================================================================== For clinical consultation, please call [phone]. ====================================================================    No results found for: "CBDTHCR", "D8THCCBX", "D9THCCBX"   Laboratory Chemistry Profile   Renal Lab Results  Component Value Date   BUN 11 02/21/2022   CREATININE 0.70 01/20/2023   BCR 18 04/28/2014   GFR 81.41 02/16/2022   GFRAA >60 06/17/2019   GFRNONAA >60 02/21/2022    Hepatic Lab Results  Component Value Date   AST 38 02/21/2022   ALT 27 02/21/2022   ALBUMIN 3.8 02/21/2022   ALKPHOS 88 02/21/2022   LIPASE 30 11/23/2013    Electrolytes Lab Results  Component Value Date   NA 130 (L) 02/21/2022   K 4.9 02/21/2022   CL 95 (L) 02/21/2022   CALCIUM 9.4 02/21/2022   MG 2.3 06/03/2019    Bone Lab Results  Component Value Date   VD25OH 36.17 01/11/2019    Inflammation (CRP: Acute Phase) (ESR: Chronic Phase) No results found for: "CRP", "ESRSEDRATE", "LATICACIDVEN"       Note: Above Lab results reviewed.  Imaging  CUP PACEART REMOTE DEVICE CHECK Scheduled remote reviewed. Normal device function.   1 VT-NS,1 sec, atrial driven tachycardia with 1:1 conduction, 194 bpm.  1 AT/AF, Burden 0.1%, 2 hrs 28 min on 11/26/22 at 19:19, AFL with average V rate 102 bpm, Not on OAC per Epic.  BiV Pacing 88.3% Next remote 91 days. MC, CVRS  Assessment  The primary encounter diagnosis was Lumbar radiculopathy. Diagnoses of Spinal stenosis, lumbar region, with  neurogenic claudication, Lumbar facet arthropathy, Lumbar spondylosis, and Chronic pain syndrome were also pertinent to this visit.  Plan of Care  Problem-specific:  No problem-specific Assessment & Plan notes found for this encounter.  [name] has a current medication list which includes the following long-term medication(s): amitriptyline, amlodipine, duloxetine, gabapentin, losartan, metoprolol succinate, pantoprazole, promethazine, and sucralfate.  Pharmacotherapy (Medications Ordered): Meds ordered this encounter  Medications   oxyCODONE-acetaminophen (PERCOCET) 10-325 MG tablet    Sig: Take 1 tablet by mouth every 4 (four) hours as needed for pain.    Dispense:  150 tablet    Refill:  0   Orders:  No orders of the defined types were placed in this encounter.  Follow-up plan:   Return in about 4 weeks (around 05/11/2023) for MM, F2F.      Recent Visits No visits were found meeting these conditions. Showing recent visits within past 90 days and meeting all other requirements Today's Visits Date Type Provider Dept  04/13/23 Office Visit Edward Jolly, MD [facility]  Showing today's visits and  meeting all other requirements Future Appointments Date Type Provider Dept  05/11/23 Appointment Edward Jolly, MD [facility]  Showing future appointments within next 90 days and meeting all other requirements  I discussed the assessment and treatment plan with the patient. The patient was provided an opportunity to ask questions and all were answered. The patient agreed with the plan and demonstrated an understanding of the instructions.  Patient advised to call back or seek an in-person evaluation if the symptoms or condition worsens.  Duration of encounter:43minutes.  Note by: Edward Jolly, MD Date: 04/13/2023; Time: 10:48 AM

## 2023-04-17 DIAGNOSIS — C674 Malignant neoplasm of posterior wall of bladder: Secondary | ICD-10-CM | POA: Diagnosis not present

## 2023-04-17 DIAGNOSIS — R8271 Bacteriuria: Secondary | ICD-10-CM | POA: Diagnosis not present

## 2023-04-19 ENCOUNTER — Other Ambulatory Visit: Payer: Self-pay | Admitting: Urology

## 2023-04-26 ENCOUNTER — Encounter: Payer: Self-pay | Admitting: Oncology

## 2023-04-27 ENCOUNTER — Ambulatory Visit: Payer: Medicare Other | Attending: Internal Medicine | Admitting: Internal Medicine

## 2023-04-27 ENCOUNTER — Encounter: Payer: Self-pay | Admitting: Internal Medicine

## 2023-04-27 VITALS — BP 136/100 | HR 77 | Ht 68.75 in | Wt 116.6 lb

## 2023-04-27 DIAGNOSIS — I493 Ventricular premature depolarization: Secondary | ICD-10-CM | POA: Diagnosis not present

## 2023-04-27 NOTE — Patient Instructions (Signed)
Medication Instructions:  The current medical regimen is effective;  continue present plan and medications.  *If you need a refill on your cardiac medications before your next appointment, please call your pharmacy*   Follow-Up: At [facility], you and your health needs are our priority.  As part of our continuing mission to provide you with exceptional heart care, we have created designated Provider Care Teams.  These Care Teams include your primary Cardiologist (physician) and Advanced Practice Providers (APPs -  Physician Assistants and Nurse Practitioners) who all work together to provide you with the care you need, when you need it.  We recommend signing up for the patient portal called "MyChart".  Sign up information is provided on this After Visit Summary.  MyChart is used to connect with patients for Virtual Visits (Telemedicine).  Patients are able to view lab/test results, encounter notes, upcoming appointments, etc.  Non-urgent messages can be sent to your provider as well.   To learn more about what you can do with MyChart, go to [email or web address].    Your next appointment:   6 month(s)  Provider:   Sherryl Manges, MD

## 2023-04-27 NOTE — Progress Notes (Signed)
 Electrophysiology Office Note   Date:  04/27/2023   ID:  [name], [name] [birth year], MRN [id number]  PCP:  Marylynn Verneita CROME, MD  Cardiologist:  MA Primary Electrophysiologist:  Elspeth Sage, MD    Chief Complaint  Patient presents with   Follow-up    Patient denies new or acute cardiac problems/concerns today.       History of Present Illness: [name] is a 83 y.o. female seen in followup  for CRT-D implanted 2/16; genchange  8/23  Nonischemic cardiomyopathy with ejection fraction 25% and left bundle branch block.  An interval recovery of LV function   Without cardiovascular complaints.  Biggest problem remains for back pain for which she takes Percocet 4 times a day with modest relief but long periods of time with significant pain.  Followed by the pain  DATE TEST EF   9/15 Myoview 28%   1/16 Cath   % CA min disease  10/17 Echo   55-65 %   6/20 Echo  60-65%     Date Cr K Hgb  7/17 0.55 4.1    1/19 0.73 4.9    3/20 0.8 4.1 12.5  11/20 0.5 4.2 14.3  12/22 0.53 4.9 13.5  12/23 0.68 4.9 10.7  11/24 0.70  14.2     At [facility] implantation of a neurostimulator for back pain assoc with new pains ( [facility] note 03/08/19) but back pain is better; she remains on narcotics with inadequate relief.    Past Medical History:  Diagnosis Date   AICD (automatic cardioverter/defibrillator) present EP cardiologist--- dr sage    placement 05-07-2014 , ef 25%,  NICM/  (02-11-2019 last echo 08-31-2018 ef 60-65%)   Arthritis    in about all my joints; for sure in my back   Bladder cancer [facility]) urologist-  dr nieves   dx 1995--  recurrent bladder cancer 2015 , s/p TURBT's and chemo instillation's ;   04/ 2019  s/p TURBT   Chronic hyponatremia    Chronic low back pain with bilateral sciatica    s/p  spinal cord stimulator @ [facility]  10-09-2018   Chronic systolic (congestive) heart failure [facility])    cardiologist-  dr deatrice   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CC)    (02-11-2019  per pt has never  been on oxygen)   Coronary artery disease cardiologist-  dr darron deatrice   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% per cardiac cath 03-03-2014   DDD (degenerative disc disease), thoracolumbar    Frequent urination    Full dentures    Gait instability    due to chronic low back pain, uses roller walker   GERD (gastroesophageal reflux disease)    History of iron deficiency anemia 11/2013   resolved w/ IV Iron  (02-11-2019  per pt has not had any issues since 2015)   History of stomach ulcers 11/2013   Hypertension    LBBB (left bundle branch block)    NICM (nonischemic cardiomyopathy) (HCC) last echo 12-31-2015 ef 55-60%   dx 09/ 2015 per echo 20%;  myoview 09/ 2015 ef 28%;  per cardiac cath 12/ 2015 ef 30%;     Nocturia more than twice per night    S/P insertion of spinal cord stimulator followed by [facility]---- Dr CANDIE Glatter (notes in care everywhere)   10-09-2018  @[facility] --- thoracic spinal cord stimular/ generator  (device from [facility])---- per pt has a control   Scoliosis    Uses walker 02/18/2021  all the time   Wears glasses    for reading   Past Surgical History:  Procedure Laterality Date   BI-VENTRICULAR IMPLANTABLE CARDIOVERTER DEFIBRILLATOR N/A 05/07/2014   Procedure: BI-VENTRICULAR IMPLANTABLE CARDIOVERTER DEFIBRILLATOR  (CRT-D);  Surgeon: Elspeth JAYSON Sage, MD;  Location: [facility] CATH LAB;  Service: Cardiovascular;  Laterality: N/A;   BIV PACEMAKER GENERATOR CHANGE OUT     CARDIAC CATHETERIZATION  03-03-2014  dr darron grass   [facility]   pLAD 20%, pRCA 20%, dRCA 50%, RPLS 50%;  ef 30%, mild elevated LVEDP, mild gradient across aortic valve LVOT   CARDIOVASCULAR STRESS TEST  11/25/2013   High risk nuclear study w/ large high severity inferior wall perfusion defect on stress and rest images, large mild severity anteroseptal wall perfusion defect on stress and rest images, No inducible ischemia/ global moderate hypokinesis, ef 28%   CATARACT EXTRACTION W/ INTRAOCULAR LENS  IMPLANT,  BILATERAL Bilateral right 12-2013 / left  02-2014   colonscopy  11/2020   benign polyps removed   CYSTOSCOPY W/ RETROGRADES Bilateral 01/06/2015   Procedure: CYSTOSCOPY WITH  BLADDER BIOPSY BILATERAL RETROGRADE PYELOGRAM,INSTILLATION OF MITOMYCIN  C;  Surgeon: Donnice Brooks, MD;  Location: [facility];  Service: Urology;  Laterality: Bilateral;   CYSTOSCOPY WITH BIOPSY N/A 10/06/2015   Procedure: CYSTO WITH BLADDER BIOPSY, FULGERATION, CHEMO IRRIGATION EPIRUBICIN  IN PACU;  Surgeon: Donnice Brooks, MD;  Location: [facility];  Service: Urology;  Laterality: N/A;   CYSTOSCOPY WITH BIOPSY N/A 02/12/2019   Procedure: CYSTOSCOPY WITH BIOPSY/ FULGURATION/ INSTILLATION OF GEMCITABINE , bilateral retrograde turbt greater 5cm;  Surgeon: Brooks Donnice, MD;  Location: [facility];  Service: Urology;  Laterality: N/A;   CYSTOSCOPY WITH BIOPSY N/A 03/19/2021   Procedure: CYSTOSCOPY WITH TRANSURETHRAL RESECTION OF BLADDER TUMOR  GREATER THAN 5CM WITH POST-OPERATIVE INSTILLATION OF EPIRUBICIN ;  Surgeon: Brooks Donnice, MD;  Location: [facility];  Service: Urology;  Laterality: N/A;   CYSTOSCOPY WITH FULGERATION N/A 06/30/2017   Procedure: [place] CYSTOSCOPY WITH [place] FULGERATION/ BLADDER BIOPSY/ INSTILLATION OF EPIRUBICIN ;  Surgeon: Brooks Donnice, MD;  Location: [facility];  Service: Urology;  Laterality: N/A;   CYSTOSCOPY WITH RETROGRADE PYELOGRAM, URETEROSCOPY AND STENT PLACEMENT Bilateral 04/18/2014   Procedure: CYSTOSCOPY WITH RETROGRADE PYELOGRAM;  Surgeon: Donnice Brooks, MD;  Location: [facility];  Service: Urology;  Laterality: Bilateral;   ESOPHAGOGASTRODUODENOSCOPY N/A 11/28/2013   Procedure: ESOPHAGOGASTRODUODENOSCOPY (EGD);  Surgeon: Elsie Cree, MD;  Location: 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[employer])   TRANSTHORACIC ECHOCARDIOGRAM  12/31/2015   dr sage   ef 55-605, grade 1 diastolic dysfunction/ mild MR/ septal motion showed abnormal function and dyssynergy   TRANSURETHRAL RESECTION OF BLADDER  1995   TRANSURETHRAL RESECTION OF BLADDER TUMOR N/A 12/13/2013   Procedure: TRANSURETHRAL RESECTION OF BLADDER TUMOR (TURBT);  Surgeon: Donnice Brooks, MD;  Location: [facility];  Service: Urology;  Laterality: N/A;   TRANSURETHRAL RESECTION OF BLADDER TUMOR N/A 01/17/2014   Procedure: TRANSURETHRAL RESECTION OF BLADDER TUMOR (TURBT);  Surgeon: Donnice Brooks, MD;  Location: [facility];  Service: Urology;  Laterality: N/A;   TRANSURETHRAL RESECTION OF BLADDER TUMOR N/A 04/18/2014  Procedure: TRANSURETHRAL RESECTION OF BLADDER TUMOR (TURBT), CYSTOGRAM;  Surgeon: Donnice Brooks, MD;  Location: [facility];  Service: Urology;  Laterality: N/A;   TRANSURETHRAL RESECTION OF BLADDER TUMOR N/A 12/13/2019   Procedure: TRANSURETHRAL RESECTION OF BLADDER TUMOR (TURBT) GREATER THAN 5CM WITH CYSTOSCOPY/ POST OPERATIVE INSTILLATION OF GEMCITABINE ;  Surgeon: Brooks Donnice, MD;  Location: [facility];  Service: Urology;  Laterality: N/A;   TRANSURETHRAL RESECTION OF BLADDER TUMOR N/A 10/06/2020   Procedure: TRANSURETHRAL RESECTION OF BLADDER TUMOR (TURBT)/ CYSTOSCOPY/  BLADDER BIOPSY;  Surgeon: Brooks Donnice, MD;  Location: [facility];  Service: Urology;  Laterality: N/A;   TRANSURETHRAL RESECTION OF BLADDER TUMOR N/A 11/30/2021   Procedure: TRANSURETHRAL RESECTION OF BLADDER TUMOR (TURBT) WITH POST OPERATIVE INSTILLATION OF GEMCITABINE ;  Surgeon: Brooks Donnice, MD;  Location: [facility];  Service: Urology;  Laterality: N/A;     Current Meds  Medication Sig   acetaminophen   (TYLENOL ) 500 MG tablet Take 1,000 mg by mouth every 8 (eight) hours as needed for moderate pain.   amitriptyline  (ELAVIL ) 50 MG tablet TAKE 1 TABLET(50 MG) BY MOUTH AT BEDTIME   amLODipine  (NORVASC ) 5 MG tablet TAKE 1 TABLET(5 MG) BY MOUTH DAILY   celecoxib  (CELEBREX ) 200 MG capsule TAKE 1 CAPSULE(200 MG) BY MOUTH TWICE DAILY   DULoxetine  (CYMBALTA ) 20 MG capsule TAKE 1 CAPSULE(20 MG) BY MOUTH DAILY   gabapentin  (NEURONTIN ) 300 MG capsule TAKE 2 CAPSULES(600 MG) BY MOUTH THREE TIMES DAILY   losartan  (COZAAR ) 100 MG tablet TAKE 1 TABLET(100 MG) BY MOUTH AT BEDTIME   Menthol, Topical Analgesic, (BIOFREEZE EX) Apply 1 application topically 4 (four) times daily as needed (back pain).   metoprolol  succinate (TOPROL -XL) 25 MG 24 hr tablet TAKE 1 TABLET(25 MG) BY MOUTH DAILY   oxyCODONE -acetaminophen  (PERCOCET) 10-325 MG tablet Take 1 tablet by mouth every 4 (four) hours as needed for pain.   pantoprazole  (PROTONIX ) 40 MG tablet Take 1 tablet (40 mg total) by mouth at bedtime.   polyethylene glycol (MIRALAX  / GLYCOLAX ) packet Take 17 g by mouth daily as needed for mild constipation.    promethazine  (PHENERGAN ) 12.5 MG tablet TAKE 1 TABLET(12.5 MG) BY MOUTH EVERY 8 HOURS AS NEEDED FOR NAUSEA OR VOMITING   sucralfate  (CARAFATE ) 1 g tablet TAKE 1 TABLET BY MOUTH ONE HOUR BEFORE EACH MEAL AND TWO HOURS AFTER OTHER MEDICATIONS (Patient taking differently: Take 1 g by mouth See admin instructions. Take 1 tablet one hour before each meal and 2 hours after all other medications)      Allergies:   Patient has no known allergies.       ROS:  Please see the history of present illness.  .   All other systems are reviewed and negative.    PHYSICAL EXAM: VS:  BP (!) 136/100 (BP Location: Left Arm, Patient Position: Sitting, Cuff Size: Normal)   Pulse 77   Ht 5' 8.75 (1.746 m)   Wt 116 lb 9.6 oz (52.9 kg)   SpO2 93%   BMI 17.34 kg/m  , BMI Body mass index is 17.34 kg/m. Well developed and well  nourished in no acute distress HENT normal Neck supple with JVP-flat Clear Device pocket well healed; without hematoma or erythema.  There is no tethering  Regular rate and rhythm, no murmur Abd-soft with active BS No Clubbing cyanosis  edema Skin-warm and dry A & Oriented  Grossly normal sensory and motor function  ECG sinus with P synchronous pacing with an upright QRS lead V1 and negative QRS  lead I and a QRSd of 122 ms  Device function is normal. Programming changes noen  See Paceart for details       ASSESSMENT AND PLAN:  Cardiomyopathy nonischemic  Congestive heart failure-chronic systolic  COPD/emphysema  Hypertension  Left bundle branch block  PVCs-infrequent  Sinus bradycardia-borderline  CRT-D Med[employer]onic    T wave oversensing resulting in P wave being in refractory and thus not tracked  Gait instability    Blood pressure remains elevated but not terrible.  Given her significant pain, we will leave it alone.  Pt heart failure status is stable. Continue metoprolol  25 and losartan  100 and amlodipine  5 mg.  Electrolytes are stable.   Reached out to pain MD     Signed, Elspeth Sage, MD  04/27/2023 4:30 PM     [facility] [address] [place] [address] [phone] (office) [phone] (fax)

## 2023-04-28 ENCOUNTER — Encounter: Payer: Self-pay | Admitting: Internal Medicine

## 2023-04-28 NOTE — Progress Notes (Signed)
 PERIOPERATIVE PRESCRIPTION FOR IMPLANTED CARDIAC DEVICE PROGRAMMING  Patient Information: Name:  [name]  DOB:  1941-02-09  MRN:  [id number]  Planned Procedure: Transurethral resection of bladder tumor with instillation of gemcitabine   Surgeon: Donnice Brooks  Date of Procedure:  05-16-23  Cautery will be used.  Position during surgery:  n/a    Device Information:  Clinic EP Physician:  Elspeth Sage, MD   Device Type:  Defibrillator Manufacturer and Phone #:  Medtronic: [phone] Pacemaker Dependent?:  No. Date of Last Device Check:  04/27/23 Normal Device Function?:  Yes.    Electrophysiologist's Recommendations:  Have magnet available. Provide continuous ECG monitoring when magnet is used or reprogramming is to be performed.  Procedure may interfere with device function.  Magnet should be placed over device during procedure.  Per Device Clinic Standing Orders, Augustin JINNY Quivers, RN  1:39 PM 04/28/2023

## 2023-04-28 NOTE — Progress Notes (Addendum)
PCP - Duncan Dull, MD  video visit 11-08-22 epic Cardiologist - Sherryl Manges LOV 04-27-23 epic Spinal cord stimulator   /ICD -  Device Orders -  Rep Notified -   Chest x-ray -  EKG - 04-27-23 epic Stress Test -  ECHO - 2020 epic Cardiac Cath -  Last device check 02-07-23 epic  Sleep Study -  CPAP -   Fasting Blood Sugar -  Checks Blood Sugar _____ times a day  Blood Thinner Instructions: Aspirin Instructions:  ERAS Protcol - PRE-SURGERY N/A   COVID vaccine -yes  Activity- Able to completes some ADL's with some SOB pt. Has COPD Anesthesia review: LBBB, CHF,ICD,COPD,HTN, CAD  Patient denies shortness of breath, fever, cough and chest pain at PAT appointment   All instructions explained to the patient, with a verbal understanding of the material. Patient agrees to go over the instructions while at home for a better understanding. Patient also instructed to self quarantine after being tested for COVID-19. The opportunity to ask questions was provided.

## 2023-04-28 NOTE — Patient Instructions (Addendum)
SURGICAL WAITING ROOM VISITATION  Patients having surgery or a procedure may have no more than 2 support people in the waiting area - these visitors may rotate.    Children under the age of 59 must have an adult with them who is not the patient.  Due to an increase in RSV and influenza rates and associated hospitalizations, children ages 21 and under may not visit patients in [facility] hospitals.  Visitors with respiratory illnesses are discouraged from visiting and should remain at home.  If the patient needs to stay at the hospital during part of their recovery, the visitor guidelines for inpatient rooms apply. Pre-op nurse will coordinate an appropriate time for 1 support person to accompany patient in pre-op.  This support person may not rotate.    Please refer to the [facility] website for the visitor guidelines for Inpatients (after your surgery is over and you are in a regular room).       Your procedure is scheduled on: 05-16-23   Report to [facility] Main Entrance    Report to admitting at    0900  AM   Call this number if you have problems the morning of surgery [phone]   Do not eat food  or drink liquids  :After Midnight.  Except sips of water with meds                  If you have questions, please contact your surgeon's office.   FOLLOW BOWEL  ANY ADDITIONAL PRE OP INSTRUCTIONS YOU RECEIVED FROM YOUR SURGEON'S OFFICE!!!     Oral Hygiene is also important to reduce your risk of infection.                                    Remember - BRUSH YOUR TEETH THE MORNING OF SURGERY WITH YOUR REGULAR TOOTHPASTE  DENTURES WILL BE REMOVED PRIOR TO SURGERY PLEASE DO NOT APPLY "Poly grip" OR ADHESIVES!!!   Do NOT smoke after Midnight   Stop all vitamins and herbal supplements 7 days before surgery.   Take these medicines the morning of surgery with A SIP OF WATER: oxycodone if needed, metoprolol,gabapentin,amlodipine, tylenol if needed    Bring CPAP  mask and tubing day of surgery.                              You may not have any metal on your body including hair pins, jewelry, and body piercing             Do not wear make-up, lotions, powders, perfumes/cologne, or deodorant  Do not wear nail polish including gel and S&S, artificial/acrylic nails, or any other type of covering on natural nails including finger and toenails. If you have artificial nails, gel coating, etc. that needs to be removed by a nail salon please have this removed prior to surgery or surgery may need to be canceled/ delayed if the surgeon/ anesthesia feels like they are unable to be safely monitored.   Do not shave  48 hours prior to surgery.                  Do not bring valuables to the hospital. [facility] IS NOT             RESPONSIBLE   FOR VALUABLES.   Contacts, glasses, dentures or bridgework  may not be worn into surgery.   Bring small overnight bag day of surgery.   DO NOT BRING YOUR HOME MEDICATIONS TO THE HOSPITAL. PHARMACY WILL DISPENSE MEDICATIONS LISTED ON YOUR MEDICATION LIST TO YOU DURING YOUR ADMISSION IN THE HOSPITAL!    Patients discharged on the day of surgery will not be allowed to drive home.  Someone NEEDS to stay with you for the first 24 hours after anesthesia.   Special Instructions: Bring a copy of your healthcare power of attorney and living will documents the day of surgery if you haven't scanned them before.              Please read over the following fact sheets you were given: IF YOU HAVE QUESTIONS ABOUT YOUR PRE-OP INSTRUCTIONS PLEASE CALL [phone]    If you test positive for Covid or have been in contact with anyone that has tested positive in the last 10 days please notify you surgeon.    [place] - Preparing for Surgery Before surgery, you can play an important role.  Because skin is not sterile, your skin needs to be as free of germs as possible.  You can reduce the number of germs on your skin by washing with CHG  (chlorahexidine gluconate) soap before surgery.  CHG is an antiseptic cleaner which kills germs and bonds with the skin to continue killing germs even after washing. Please DO NOT use if you have an allergy to CHG or antibacterial soaps.  If your skin becomes reddened/irritated stop using the CHG and inform your nurse when you arrive at Short Stay. Do not shave (including legs and underarms) for at least 48 hours prior to the first CHG shower.  You may shave your face/neck. Please follow these instructions carefully:  1.  Shower with CHG Soap the night before surgery and the  morning of Surgery.  2.  If you choose to wash your hair, wash your hair first as usual with your  normal  shampoo.  3.  After you shampoo, rinse your hair and body thoroughly to remove the  shampoo.                           4.  Use CHG as you would any other liquid soap.  You can apply chg directly  to the skin and wash                       Gently with a scrungie or clean washcloth.  5.  Apply the CHG Soap to your body ONLY FROM THE NECK DOWN.   Do not use on face/ open                           Wound or open sores. Avoid contact with eyes, ears mouth and genitals (private parts).                       Wash face,  Genitals (private parts) with your normal soap.             6.  Wash thoroughly, paying special attention to the area where your surgery  will be performed.  7.  Thoroughly rinse your body with warm water from the neck down.  8.  DO NOT shower/wash with your normal soap after using and rinsing off  the CHG Soap.  9.  Pat yourself dry with a clean towel.            10.  Wear clean pajamas.            11.  Place clean sheets on your bed the night of your first shower and do not  sleep with pets. Day of Surgery : Do not apply any lotions/deodorants the morning of surgery.  Please wear clean clothes to the hospital/surgery center.  FAILURE TO FOLLOW THESE INSTRUCTIONS MAY RESULT IN THE CANCELLATION OF  YOUR SURGERY PATIENT SIGNATURE_________________________________  NURSE SIGNATURE__________________________________  ________________________________________________________________________

## 2023-05-01 ENCOUNTER — Encounter: Payer: Self-pay | Admitting: Oncology

## 2023-05-03 ENCOUNTER — Other Ambulatory Visit: Payer: Self-pay | Admitting: Internal Medicine

## 2023-05-04 LAB — CUP PACEART INCLINIC DEVICE CHECK
Battery Remaining Longevity: 73 mo
Battery Voltage: 2.98 V
Brady Statistic AP VP Percent: 26.4 %
Brady Statistic AP VS Percent: 0.6 %
Brady Statistic AS VP Percent: 69.87 %
Brady Statistic AS VS Percent: 3.13 %
Brady Statistic RA Percent Paced: 25.58 %
Brady Statistic RV Percent Paced: 91.88 %
Date Time Interrogation Session: 20250206191221
HighPow Impedance: 73 Ohm
Implantable Lead Connection Status: 753985
Implantable Lead Connection Status: 753985
Implantable Lead Connection Status: 753985
Implantable Lead Implant Date: 20160217
Implantable Lead Implant Date: 20160217
Implantable Lead Implant Date: 20160217
Implantable Lead Location: 753858
Implantable Lead Location: 753859
Implantable Lead Location: 753860
Implantable Lead Model: 4298
Implantable Lead Model: 5076
Implantable Pulse Generator Implant Date: 20230817
Lead Channel Impedance Value: 228 Ohm
Lead Channel Impedance Value: 228 Ohm
Lead Channel Impedance Value: 257.018
Lead Channel Impedance Value: 257.018
Lead Channel Impedance Value: 257.018
Lead Channel Impedance Value: 380 Ohm
Lead Channel Impedance Value: 456 Ohm
Lead Channel Impedance Value: 456 Ohm
Lead Channel Impedance Value: 456 Ohm
Lead Channel Impedance Value: 494 Ohm
Lead Channel Impedance Value: 513 Ohm
Lead Channel Impedance Value: 570 Ohm
Lead Channel Impedance Value: 589 Ohm
Lead Channel Impedance Value: 779 Ohm
Lead Channel Impedance Value: 779 Ohm
Lead Channel Impedance Value: 893 Ohm
Lead Channel Impedance Value: 950 Ohm
Lead Channel Impedance Value: 969 Ohm
Lead Channel Pacing Threshold Amplitude: 0.5 V
Lead Channel Pacing Threshold Amplitude: 0.75 V
Lead Channel Pacing Threshold Amplitude: 0.875 V
Lead Channel Pacing Threshold Amplitude: 1.375 V
Lead Channel Pacing Threshold Amplitude: 1.5 V
Lead Channel Pacing Threshold Pulse Width: 0.4 ms
Lead Channel Pacing Threshold Pulse Width: 0.4 ms
Lead Channel Pacing Threshold Pulse Width: 0.4 ms
Lead Channel Pacing Threshold Pulse Width: 0.4 ms
Lead Channel Pacing Threshold Pulse Width: 0.4 ms
Lead Channel Sensing Intrinsic Amplitude: 2.5 mV
Lead Channel Sensing Intrinsic Amplitude: 23.5 mV
Lead Channel Sensing Intrinsic Amplitude: 23.75 mV
Lead Channel Sensing Intrinsic Amplitude: 3.5 mV
Lead Channel Setting Pacing Amplitude: 1.5 V
Lead Channel Setting Pacing Amplitude: 2 V
Lead Channel Setting Pacing Amplitude: 2 V
Lead Channel Setting Pacing Pulse Width: 0.4 ms
Lead Channel Setting Pacing Pulse Width: 0.4 ms
Lead Channel Setting Sensing Sensitivity: 0.45 mV
Zone Setting Status: 755011
Zone Setting Status: 755011

## 2023-05-05 ENCOUNTER — Encounter (HOSPITAL_COMMUNITY): Payer: Self-pay

## 2023-05-05 ENCOUNTER — Encounter (HOSPITAL_COMMUNITY)
Admission: RE | Admit: 2023-05-05 | Discharge: 2023-05-05 | Disposition: A | Discharge status: Home / Self Care | Payer: Medicare Other | Source: Ambulatory Visit | Attending: Urology | Admitting: Urology

## 2023-05-05 ENCOUNTER — Other Ambulatory Visit: Payer: Self-pay

## 2023-05-05 VITALS — Ht 68.75 in | Wt 115.0 lb

## 2023-05-05 DIAGNOSIS — I10 Essential (primary) hypertension: Secondary | ICD-10-CM

## 2023-05-05 HISTORY — DX: Anxiety disorder, unspecified: F41.9

## 2023-05-05 HISTORY — DX: Depression, unspecified: F32.A

## 2023-05-05 NOTE — Progress Notes (Signed)
Pt. Not feeling well due to back pain . Did not show to preop. Done as phone call. Pt. Stated she was going to try to see her PCP for labs prior to surgery but if not we can do same day labs.

## 2023-05-08 NOTE — Anesthesia Preprocedure Evaluation (Addendum)
 Anesthesia Evaluation  Patient identified by MRN, date of birth, ID band Patient awake    Reviewed: Allergy & Precautions, NPO status , Patient's Chart, lab work & pertinent test results, reviewed documented beta blocker date and time   History of Anesthesia Complications Negative for: history of anesthetic complications  Airway Mallampati: I  TM Distance: >3 FB     Dental  (+) Edentulous Upper, Edentulous Lower   Pulmonary COPD, Current Smoker and Patient abstained from smoking.   breath sounds clear to auscultation       Cardiovascular hypertension, + Peripheral Vascular Disease and +CHF  (-) CAD + dysrhythmias + pacemaker + Cardiac Defibrillator  Rhythm:Regular Rate:Normal     Neuro/Psych  PSYCHIATRIC DISORDERS Anxiety Depression     Neuromuscular disease    GI/Hepatic ,GERD  ,,  Endo/Other    Renal/GU      Musculoskeletal   Abdominal   Peds  Hematology   Anesthesia Other Findings   Reproductive/Obstetrics                             Anesthesia Physical Anesthesia Plan  ASA: 3  Anesthesia Plan: General   Post-op Pain Management:    Induction: Intravenous  PONV Risk Score and Plan: 2 and Ondansetron and Dexamethasone  Airway Management Planned: Oral ETT  Additional Equipment:   Intra-op Plan:   Post-operative Plan: Extubation in OR  Informed Consent: I have reviewed the patients History and Physical, chart, labs and discussed the procedure including the risks, benefits and alternatives for the proposed anesthesia with the patient or authorized representative who has indicated his/her understanding and acceptance.     Dental advisory given  Plan Discussed with: CRNA  Anesthesia Plan Comments: (See PAT note 05/05/2023 )       Anesthesia Quick Evaluation

## 2023-05-08 NOTE — Progress Notes (Signed)
Anesthesia Chart Review   Case: [id number] Date/Time: 05/16/23 1100   Procedure: TRANSURETHRAL RESECTION OF BLADDER TUMOR (TURBT) with GEMCITABINE - 60 MINUTE CASE   Anesthesia type: General   Pre-op diagnosis: BLADDER TUMOR   Location: [facility] / [facility]   Surgeons: Jerilee Field, MD       DISCUSSION:83 y.o. smoker with h/o HTN, COPD, LBBB, NICM, AICD in place (device orders in 04/28/2023 progress note), bladder tumor scheduled for above procedure 05/16/2023 with Dr. Perlie Gold.   Follows with pain management, currently on oxycodone 10mg  q6 hrs. She has a spinal cord stimulator in place.  Pt last seen by cardiology 04/27/2023. Nonischemic cardiomyopathy with ejection fraction 25% and left bundle branch block.  An interval recovery of LV function.  Echo 08/31/2018 with EF 60-65%. Per OV note notes heart failure status stable, no changes to medications. 6 month follow up recommended.   Pt same day workup, not seen in PAT clinic, labs DOS.  VS: Ht 5' 8.75" (1.746 m)   Wt 52.2 kg   BMI 17.11 kg/m   PROVIDERS: Sherlene Shams, MD is PCP   Cardiologist is Dr. Sherryl Manges LABS:  labs DOS (all labs ordered are listed, but only abnormal results are displayed)  Labs Reviewed - No data to display   IMAGES:   EKG:   CV: Echo 08/31/2018 1. The left ventricle has normal systolic function with an ejection  fraction of 60-65%. The cavity size was normal. Left ventricular diastolic  Doppler parameters are consistent with impaired relaxation.   2. The right ventricle has normal systolic function. The cavity was  normal. There is no increase in right ventricular wall thickness.Normal  RVSP   3. PVCs noted   Past Medical History:  Diagnosis Date   AICD (automatic cardioverter/defibrillator) present EP cardiologist--- dr Graciela Husbands    placement 05-07-2014 , ef 25%,  NICM/  (02-11-2019 last echo 08-31-2018 ef 60-65%)   Anxiety    Arthritis    "in about all my joints; for sure  in my back"   Bladder cancer [facility]) urologist-  dr Mena Goes   dx 1995--  recurrent bladder cancer 2015 , s/p TURBT's and chemo instillation's ;   04/ 2019  s/p TURBT   Chronic hyponatremia    Chronic low back pain with bilateral sciatica    s/p  spinal cord stimulator @ [facility]  10-09-2018   Chronic systolic (congestive) heart failure [facility])    cardiologist-  dr Jerolyn Center   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CC)    (02-11-2019  per pt has never been on oxygen)   Coronary artery disease cardiologist-  dr Lorine Bears   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% per cardiac cath 03-03-2014   DDD (degenerative disc disease), thoracolumbar    Depression    Frequent urination    Full dentures    Gait instability    due to chronic low back pain, uses roller walker   GERD (gastroesophageal reflux disease)    History of iron deficiency anemia 11/2013   resolved w/ IV Iron  (02-11-2019  per pt has not had any issues since 2015)   History of stomach ulcers 11/2013   Hypertension    LBBB (left bundle branch block)    NICM (nonischemic cardiomyopathy) (HCC) last echo 12-31-2015 ef 55-60%   dx 09/ 2015 per echo 20%;  myoview 09/ 2015 ef 28%;  per cardiac cath 12/ 2015 ef 30%;     Nocturia more than twice per night  S/P insertion of spinal cord stimulator followed by [facility]---- Dr Letitia Caul (notes in care everywhere)   10-09-2018  @[facility] --- thoracic spinal cord stimular/ generator  (device from [facility])---- per pt has a control   Scoliosis    Uses walker 02/18/2021   all the time   Wears glasses    for reading    Past Surgical History:  Procedure Laterality Date   BI-VENTRICULAR IMPLANTABLE CARDIOVERTER DEFIBRILLATOR N/A 05/07/2014   Procedure: BI-VENTRICULAR IMPLANTABLE CARDIOVERTER DEFIBRILLATOR  (CRT-D);  Surgeon: [facility] Salvia, MD;  Location: [facility] CATH LAB;  Service: Cardiovascular;  Laterality: N/A;   BIV PACEMAKER GENERATOR CHANGE OUT     CARDIAC CATHETERIZATION  03-03-2014  dr Lorine Bears   [facility]    pLAD 20%, pRCA 20%, dRCA 50%, RPLS 50%;  ef 30%, mild elevated LVEDP, mild gradient across aortic valve LVOT   CARDIOVASCULAR STRESS TEST  11/25/2013   High risk nuclear study w/ large high severity inferior wall perfusion defect on stress and rest images, large mild severity anteroseptal wall perfusion defect on stress and rest images, No inducible ischemia/ global moderate hypokinesis, ef 28%   CATARACT EXTRACTION W/ INTRAOCULAR LENS  IMPLANT, BILATERAL Bilateral right 12-2013 / left  02-2014   colonscopy  11/2020   benign polyps removed   CYSTOSCOPY W/ RETROGRADES Bilateral 01/06/2015   Procedure: CYSTOSCOPY WITH  BLADDER BIOPSY BILATERAL RETROGRADE PYELOGRAM,INSTILLATION OF MITOMYCIN C;  Surgeon: Jerilee Field, MD;  Location: [facility];  Service: Urology;  Laterality: Bilateral;   CYSTOSCOPY WITH BIOPSY N/A 10/06/2015   Procedure: CYSTO WITH BLADDER BIOPSY, FULGERATION, CHEMO IRRIGATION EPIRUBICIN IN PACU;  Surgeon: Jerilee Field, MD;  Location: [facility];  Service: Urology;  Laterality: N/A;   CYSTOSCOPY WITH BIOPSY N/A 02/12/2019   Procedure: CYSTOSCOPY WITH BIOPSY/ FULGURATION/ INSTILLATION OF GEMCITABINE, bilateral retrograde turbt greater 5cm;  Surgeon: Jerilee Field, MD;  Location: [facility];  Service: Urology;  Laterality: N/A;   CYSTOSCOPY WITH BIOPSY N/A 03/19/2021   Procedure: CYSTOSCOPY WITH TRANSURETHRAL RESECTION OF BLADDER TUMOR  GREATER THAN 5CM WITH POST-OPERATIVE INSTILLATION OF EPIRUBICIN;  Surgeon: Jerilee Field, MD;  Location: [facility];  Service: Urology;  Laterality: N/A;   CYSTOSCOPY WITH FULGERATION N/A 06/30/2017   Procedure: Velda Shell CYSTOSCOPY WITH [place] FULGERATION/ BLADDER BIOPSY/ INSTILLATION OF EPIRUBICIN;  Surgeon: Jerilee Field, MD;  Location: [facility];  Service: Urology;  Laterality: N/A;   CYSTOSCOPY WITH RETROGRADE PYELOGRAM, URETEROSCOPY AND STENT PLACEMENT Bilateral 04/18/2014   Procedure: CYSTOSCOPY WITH  RETROGRADE PYELOGRAM;  Surgeon: Jerilee Field, MD;  Location[facility];  Service: Urology;  Laterality: Bilateral;   ESOPHAGOGASTRODUODENOSCOPY N/A 11/28/2013   Procedure: ESOPHAGOGASTRODUODENOSCOPY (EGD);  Surgeon: Willis Modena, MD;  Location: [facility] ENDOSCOPY;  Service: Endoscopy;  Laterality: N/A;   ICD GENERATOR CHANGEOUT N/A 11/04/2021   Procedure: ICD GENERATOR CHANGEOUT;  Surgeon: [facility] Salvia, MD;  Location: [facility] INVASIVE CV LAB;  Service: Cardiovascular;  Laterality: N/A;   LUMBAR DISC SURGERY  1980's   "ruptured disc"   SPINAL CORD STIMULATOR IMPLANT  10-09-2018   @[facility]    Thoracic spinal cord stimular/ genertor  (left flank)----- (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[employer])   TRANSTHORACIC ECHOCARDIOGRAM  12/31/2015   dr Graciela Husbands   ef 55-605, grade 1 diastolic dysfunction/ mild MR/ septal motion showed abnormal function and dyssynergy   TRANSURETHRAL RESECTION OF BLADDER  1995   TRANSURETHRAL RESECTION OF BLADDER TUMOR N/A 12/13/2013   Procedure: TRANSURETHRAL RESECTION OF BLADDER TUMOR (TURBT);  Surgeon: Jerilee Field, MD;  Location[facility];  Service: Urology;  Laterality: N/A;   TRANSURETHRAL RESECTION OF BLADDER TUMOR N/A 01/17/2014   Procedure: TRANSURETHRAL RESECTION OF BLADDER TUMOR (TURBT);  Surgeon: Jerilee Field, MD;  Location[facility];  Service: Urology;  Laterality: N/A;   TRANSURETHRAL RESECTION OF BLADDER TUMOR N/A 04/18/2014   Procedure: TRANSURETHRAL RESECTION OF BLADDER TUMOR (TURBT), CYSTOGRAM;  Surgeon: Jerilee Field, MD;  Location[facility];  Service: Urology;  Laterality: N/A;   TRANSURETHRAL RESECTION OF BLADDER TUMOR N/A 12/13/2019   Procedure: TRANSURETHRAL RESECTION OF BLADDER TUMOR (TURBT) GREATER THAN 5CM WITH CYSTOSCOPY/ POST OPERATIVE INSTILLATION OF GEMCITABINE;  Surgeon: Jerilee Field, MD;  Location: [facility];  Service: Urology;  Laterality: N/A;   TRANSURETHRAL RESECTION OF BLADDER TUMOR N/A 10/06/2020   Procedure: TRANSURETHRAL RESECTION OF  BLADDER TUMOR (TURBT)/ CYSTOSCOPY/  BLADDER BIOPSY;  Surgeon: Jerilee Field, MD;  Location[facility];  Service: Urology;  Laterality: N/A;   TRANSURETHRAL RESECTION OF BLADDER TUMOR N/A 11/30/2021   Procedure: TRANSURETHRAL RESECTION OF BLADDER TUMOR (TURBT) WITH POST OPERATIVE INSTILLATION OF GEMCITABINE;  Surgeon: Jerilee Field, MD;  Location: [facility];  Service: Urology;  Laterality: N/A;    MEDICATIONS:  acetaminophen (TYLENOL) 500 MG tablet   amitriptyline (ELAVIL) 50 MG tablet   amLODipine (NORVASC) 5 MG tablet   celecoxib (CELEBREX) 200 MG capsule   DULoxetine (CYMBALTA) 20 MG capsule   gabapentin (NEURONTIN) 300 MG capsule   losartan (COZAAR) 100 MG tablet   Menthol, Topical Analgesic, (BIOFREEZE EX)   metoprolol succinate (TOPROL-XL) 25 MG 24 hr tablet   oxyCODONE-acetaminophen (PERCOCET) 10-325 MG tablet   pantoprazole (PROTONIX) 40 MG tablet   polyethylene glycol (MIRALAX / GLYCOLAX) packet   promethazine (PHENERGAN) 12.5 MG tablet   No current facility-administered medications for this encounter.    epirubicin (ELLENCE) 50 mg in sodium chloride 0.9 % bladder instillation   gemcitabine (GEMZAR) chemo syringe for bladder instillation 2,000 mg   gemcitabine (GEMZAR) chemo syringe for bladder instillation 2,000 mg   Jodell Cipro Ward, PA-C [facility] [phone]

## 2023-05-11 ENCOUNTER — Encounter: Payer: Medicare Other | Admitting: Student in an Organized Health Care Education/Training Program

## 2023-05-12 ENCOUNTER — Telehealth: Payer: Self-pay

## 2023-05-12 NOTE — Telephone Encounter (Signed)
Dr Cherylann Ratel, when trying to reschedule the patients, we ran into a problem here.  I tried to put her on as a virtual for Tuesday the 25th but she is having surgery.  The problem is she is out of her meds.  She ran out early and I told her her meds were supposed to last until the 26th.  She went into a panic attack on the phone.  She said she knew that, but she was out.  I know this has been a problem in the past, but she insists that she can't wait until you come back and wanted to know if you could at least send in enough until we could get her an appt to come in.  You are off several days so I didn't know  if you would send her in a month or make her wait until she can get an appt. Thanks

## 2023-05-15 ENCOUNTER — Encounter: Payer: Medicare Other | Admitting: Student in an Organized Health Care Education/Training Program

## 2023-05-15 NOTE — H&P (Signed)
 Office Visit Report     04/17/2023   --------------------------------------------------------------------------------   [name]  MRN: [id number]  DOB: September 01, 1940, 83 year old Female  SSN: [id number]   PRIMARY CARE:  Robert L. Foy Guadalajara, MD  PRIMARY CARE FAX:  [phone]  REFERRING:  Doyne Keel, MD  PROVIDER:  Jerilee Field, M.D.  LOCATION:  [facility] [phone]     --------------------------------------------------------------------------------   CC/HPI: F/u -   1) bladder ca - History of bladder cancer in 1995 when she saw Dr. Elige Radon. Recurrence noted in 2015.   TUR/biopsy:  -Sept 2015, Oct 2015 and Jan 2016 - HG Ta disease - muscle present and negative.  -Oct 2016 bbx/turbt - multifocal LG Ta (mmc in pacu);  -Jul 2017 Cysto, bbx - LG Ta - [facility] in PACU  -Apr 2019 small HG and LG Ta recurrence + epirubicin in PACU  -Dec 2019 - fulguration of small posterior recurrence  -Nov 2020 mf HG Ta  -Jun 2021 cysto erythema, cytology negative  -Sep 2021 LG Ta  -Jul 2022 HG Ta - gemcitabine in PACU  -Dec 2022 HG Ta - gemcitabine in PACU  -Sep 2023 HG Ta with focal T1 - gemcitabine in PACU   -Staging/upper tract:  -Sep 2023 CT a/p - benign  -Sep 2022 CT a/p - benign  -Nov 202 RGPs,  -Jul 2018 CT a/p  -Nov 2024 CT a/p benign   Intravesical tx:  Dec 2023 six cycles Gemzar Dr. Orlie Dakin  Jan 2021 two cycles Gemzar with Dr. Orlie Dakin (Covid, bacteriuria and LUTS complicated care)  Jan 2020 BCG maintenance  Jan 2017 reintroduced BCG x 6   She returns in management of the above. Oct 2024 cysto with granulation posteriorly. Nov 2024 CT benign. She returns and has had no dysuria but dark urine. No blood clots. Cystoscopy today-January 2025-with some posterior recurrence.     ALLERGIES: No Allergies    MEDICATIONS: Metoprolol Succinate 25 mg tablet, extended release 24 hr  Percocet 7.5 mg-325 mg tablet 1 tablet PO Q 8 H PRN  Solifenacin Succinate  5 mg tablet 1 tablet PO Daily  Amlodipine Besylate 2.5 mg tablet  Carafate  Gabapentin  Losartan Potassium 100 mg tablet 1 tablet PO Daily  Miralax 17 gram/dose powder 0 Oral  Oxycodone-Acetaminophen 10 mg-325 mg tablet 1 tablet PO Daily  Pantoprazole Sodium  Phenergan  Sucralfate 1 gram tablet 1 tablet PO Daily     GU PSH: Bladder Instill AntiCA Agent - 11/30/2021, 03/19/2021, 2021, 2020, 2020, 2020, 2019, 2019, 2019, 2019, 2019, 2018, 2018, 2018, 2018, 2018, 2018, 2017, 2017, 2017, 2017, 2016, 2016 Cystoscopy - 12/30/2022, 09/07/2022, 06/01/2022, 03/03/2022, 09/01/2021, 05/26/2021, 12/30/2020, 2022, 2022, 2021, 2021, 2021, 2021, 2020, 2020, 2020, 2019, 2019, 2019, 2018, 2018, 2017, 2017 Cystoscopy Fulguration - 2019 Cystoscopy TURBT <2 cm - 2022 Cystoscopy TURBT >5 cm - 11/30/2021, 2021, 2016, 2015, 2015 Cystoscopy TURBT 2-5 cm - 03/19/2021, 2020, 2019, 2017, 2016 Locm 300-399Mg /Ml Iodine,1Ml - 2018       PSH Notes: Bladder Injection Of Cancer Treatment, Cystoscopy With Fulguration Medium Lesion (2-5cm), Cystoscopy With Fulguration Large Lesion (Over 5cm), Bladder Injection Of Cancer Treatment, Cystoscopy With Fulguration Large Lesion (Over 5cm), Bladder Surgery, Back Surgery, Cystoscopy With Fulguration Large Lesion (Over 5cm)   NON-GU PSH: No Non-GU PSH    GU PMH: History of bladder cancer, NON-GU PMH: Bacteriuria (Stable), She is covered with cephalexin and I will send urine for culture. - 06/01/2022, Bacteriuria, asymptomatic, - 2016 Encounter for  general adult medical examination without abnormal findings, Encounter for preventive health examination - 2017 Other constipation, Chronic constipation - 2015 Personal history of other diseases of the circulatory system, History of cardiac disorder - 2015, History of cardiac arrhythmia, - 2015, History of hypertension, - 2015 Personal history of other diseases of the digestive system, History of gastric ulcer - 2015 Personal history of other  diseases of the musculoskeletal system and connective tissue, History of degenerative disc disease - 2015    FAMILY HISTORY: Colon Cancer - Runs In Family Death - Runs In Family liver cancer - Runs In Family Lung Cancer - Runs In Family   SOCIAL HISTORY: Marital Status: Widowed Preferred Language: English; Ethnicity: Not Hispanic Or Latino; Race: White Current Smoking Status: Patient smokes.  Has never drank.  Does not drink caffeine.     Notes: Current every day smoker, Six children, Alcohol use, Widowed, Caffeine use   REVIEW OF SYSTEMS:    GU Review Female:   dark urine, .  Patient reports burning /pain with urination. Patient denies stream starts and stops, trouble starting your stream, hard to postpone urination, get up at night to urinate, leakage of urine, being pregnant, have to strain to urinate, and frequent urination.  Gastrointestinal (Upper):   Patient denies nausea, vomiting, and indigestion/ heartburn.  Gastrointestinal (Lower):   Patient denies diarrhea and constipation.  Constitutional:   Patient denies fever, night sweats, weight loss, and fatigue.  Skin:   Patient denies skin rash/ lesion and itching.  Eyes:   Patient denies blurred vision and double vision.  Ears/ Nose/ Throat:   Patient denies sore throat and sinus problems.  Hematologic/Lymphatic:   Patient denies swollen glands and easy bruising.  Cardiovascular:   Patient denies leg swelling and chest pains.  Respiratory:   Patient denies cough and shortness of breath.  Endocrine:   Patient denies excessive thirst.  Musculoskeletal:   Patient denies back pain and joint pain.  Neurological:   Patient denies headaches and dizziness.  Psychologic:   Patient denies depression and anxiety.   VITAL SIGNS: None   GU PHYSICAL EXAMINATION:    External Genitalia: No hirsutism, no rash, no scarring, no cyst, no erythematous lesion, no papular lesion, no blanched lesion, no warty lesion. No edema.  Urethral Meatus:  Normal size. Normal position. No discharge.  Urethra: No tenderness, no mass, no scarring. No hypermobility. No leakage.  Bladder: Normal to palpation, no tenderness, no mass, normal size.  Vagina: Moderate vaginal atrophy. No stenosis. No rectocele. No cystocele. No enterocele.    MULTI-SYSTEM PHYSICAL EXAMINATION:    Constitutional: Well-nourished. No physical deformities. Normally developed. Good grooming.  Neck: Neck symmetrical, not swollen. Normal tracheal position.  Respiratory: No labored breathing, no use of accessory muscles.   Cardiovascular: Normal temperature, normal extremity pulses, no swelling, no varicosities.  Neurologic / Psychiatric: Oriented to time, oriented to place, oriented to person. No depression, no anxiety, no agitation.  Gastrointestinal: No mass, no tenderness, no rigidity, non obese abdomen.     Complexity of Data:  X-Ray Review: C.T. Abdomen/Pelvis: Reviewed Films. Discussed With Patient. 01/2023    PROCEDURES:         Flexible Cystoscopy - 52000  Risks, benefits, and some of the potential complications of the procedure were discussed at length with the patient including infection, bleeding, voiding discomfort, urinary retention, fever, chills, sepsis, and others. All questions were answered. Informed consent was obtained. Antibiotic prophylaxis was given. Sterile technique and intraurethral analgesia were used. Chaperone -  lauren e. - for exam and cystoscopy.   Meatus:  Normal size. Normal location. Normal condition.  Urethra:  No hypermobility. No leakage.  Ureteral Orifices:  Normal location. Normal size. Normal shape. Effluxed clear urine.  Bladder:  A few posterior wall tumors. No trabeculation. Normal mucosa. No stones.      The lower urinary tract was carefully examined. The procedure was well-tolerated and without complications. Antibiotic instructions were given. Instructions were given to call the office immediately for bloody urine, difficulty  urinating, urinary retention, painful or frequent urination, fever, chills, nausea, vomiting or other illness. The patient stated that she understood these instructions and would comply with them.         Pelvic Examination - [phone] chaperone: Chaperone - lauren e. - for exam and cystoscopy.          Urinalysis w/Scope - 81001 Dipstick Dipstick Cont'd Micro  Color: Yellow Bilirubin: Neg WBC/hpf: 20 - 40/hpf  Appearance: Slightly Cloudy Ketones: Neg RBC/hpf: 10 - 20/hpf  Specific Gravity: 1.010 Blood: 3+ Bacteria: Mod (26-50/hpf)  pH: 6.5 Protein: Trace Cystals: Amorph Phosphates  Glucose: Neg Urobilinogen: 0.2 Casts: NS (Not Seen)    Nitrites: Positive Trichomonas: Not Present    Leukocyte Esterase: 3+ Mucous: Present      Epithelial Cells: 20 - 40/hpf      Yeast: NS (Not Seen)      Sperm: Not Present    Notes:  large amount of transiional epithelial cells and Renal Tubular Epithelial cells present     ASSESSMENT:      ICD-10 Details  1 GU:   Bladder Cancer Posterior - 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 She will proceed.   2 NON-GU:   Bacteriuria - R82.71 Chronic, Stable   PLAN:           Orders Labs Urine Culture          Schedule Return Visit/Planned Activity: Next Available Appointment - Schedule Surgery          Document Letter(s):  Created for Patient: Clinical Summary         Notes:   cc: Dr. Foy Guadalajara         Next Appointment:      Next Appointment: 05/16/2023 11:15 AM    Appointment Type: Surgery     Location: [facility] [phone]    Provider: Jerilee Field, M.D.    Reason for Visit: OP--[facility]--TURBT      * Signed by Jerilee Field, M.D. on 04/18/23 at 4:31 PM (EST)*     ADD: Urine cx > 100K mixed growth

## 2023-05-16 ENCOUNTER — Ambulatory Visit (HOSPITAL_COMMUNITY): Payer: Medicare Other | Admitting: Physician Assistant

## 2023-05-16 ENCOUNTER — Ambulatory Visit (HOSPITAL_BASED_OUTPATIENT_CLINIC_OR_DEPARTMENT_OTHER): Payer: Medicare Other | Admitting: Anesthesiology

## 2023-05-16 ENCOUNTER — Ambulatory Visit (HOSPITAL_COMMUNITY)
Admission: RE | Admit: 2023-05-16 | Discharge: 2023-05-16 | Disposition: A | Discharge status: Home / Self Care | Payer: Medicare Other | Attending: Urology | Admitting: Urology

## 2023-05-16 ENCOUNTER — Other Ambulatory Visit: Payer: Self-pay

## 2023-05-16 ENCOUNTER — Encounter (HOSPITAL_COMMUNITY)
Admission: RE | Disposition: A | Discharge status: Home / Self Care | Payer: Self-pay | Source: Home / Self Care | Attending: Urology

## 2023-05-16 ENCOUNTER — Encounter (HOSPITAL_COMMUNITY): Payer: Self-pay | Admitting: Urology

## 2023-05-16 DIAGNOSIS — I110 Hypertensive heart disease with heart failure: Secondary | ICD-10-CM | POA: Insufficient documentation

## 2023-05-16 DIAGNOSIS — C674 Malignant neoplasm of posterior wall of bladder: Secondary | ICD-10-CM | POA: Diagnosis not present

## 2023-05-16 DIAGNOSIS — F418 Other specified anxiety disorders: Secondary | ICD-10-CM | POA: Diagnosis not present

## 2023-05-16 DIAGNOSIS — R8271 Bacteriuria: Secondary | ICD-10-CM | POA: Diagnosis not present

## 2023-05-16 DIAGNOSIS — C679 Malignant neoplasm of bladder, unspecified: Secondary | ICD-10-CM | POA: Diagnosis not present

## 2023-05-16 DIAGNOSIS — I509 Heart failure, unspecified: Secondary | ICD-10-CM

## 2023-05-16 DIAGNOSIS — Z950 Presence of cardiac pacemaker: Secondary | ICD-10-CM | POA: Insufficient documentation

## 2023-05-16 DIAGNOSIS — F17210 Nicotine dependence, cigarettes, uncomplicated: Secondary | ICD-10-CM | POA: Diagnosis not present

## 2023-05-16 DIAGNOSIS — I10 Essential (primary) hypertension: Secondary | ICD-10-CM

## 2023-05-16 DIAGNOSIS — J449 Chronic obstructive pulmonary disease, unspecified: Secondary | ICD-10-CM | POA: Insufficient documentation

## 2023-05-16 DIAGNOSIS — I5022 Chronic systolic (congestive) heart failure: Secondary | ICD-10-CM | POA: Diagnosis not present

## 2023-05-16 DIAGNOSIS — F17200 Nicotine dependence, unspecified, uncomplicated: Secondary | ICD-10-CM | POA: Insufficient documentation

## 2023-05-16 DIAGNOSIS — K219 Gastro-esophageal reflux disease without esophagitis: Secondary | ICD-10-CM | POA: Diagnosis not present

## 2023-05-16 DIAGNOSIS — I739 Peripheral vascular disease, unspecified: Secondary | ICD-10-CM | POA: Insufficient documentation

## 2023-05-16 LAB — CBC
HCT: 42.6 % (ref 36.0–46.0)
Hemoglobin: 13.9 g/dL (ref 12.0–15.0)
MCH: 30.6 pg (ref 26.0–34.0)
MCHC: 32.6 g/dL (ref 30.0–36.0)
MCV: 93.8 fL (ref 80.0–100.0)
Platelets: 157 10*3/uL (ref 150–400)
RBC: 4.54 MIL/uL (ref 3.87–5.11)
RDW: 14.1 % (ref 11.5–15.5)
WBC: 8.4 10*3/uL (ref 4.0–10.5)
nRBC: 0 % (ref 0.0–0.2)

## 2023-05-16 LAB — BASIC METABOLIC PANEL
Anion gap: 11 (ref 5–15)
BUN: 13 mg/dL (ref 8–23)
CO2: 25 mmol/L (ref 22–32)
Calcium: 9.3 mg/dL (ref 8.9–10.3)
Chloride: 96 mmol/L — ABNORMAL LOW (ref 98–111)
Creatinine, Ser: 0.58 mg/dL (ref 0.44–1.00)
GFR, Estimated: >60 mL/min (ref >60)
Glucose, Bld: 95 mg/dL (ref 70–99)
Potassium: 3.8 mmol/L (ref 3.5–5.1)
Sodium: 132 mmol/L — ABNORMAL LOW (ref 135–145)

## 2023-05-16 SURGERY — TURBT, WITH CHEMOTHERAPEUTIC AGENT INSTILLATION INTO BLADDER
Anesthesia: General

## 2023-05-16 MED ORDER — NITROFURANTOIN MONOHYD MACRO 100 MG PO CAPS: 100.0000 mg | ORAL_CAPSULE | Freq: Every day | ORAL | 0 refills | Status: AC

## 2023-05-16 MED ORDER — FENTANYL CITRATE PF 50 MCG/ML IJ SOSY
PREFILLED_SYRINGE | INTRAMUSCULAR | Status: AC
  Filled 2023-05-16: qty 1

## 2023-05-16 MED ORDER — OXYCODONE HCL 5 MG PO TABS
ORAL_TABLET | ORAL | Status: AC
  Filled 2023-05-16: qty 1

## 2023-05-16 MED ORDER — ACETAMINOPHEN 10 MG/ML IV SOLN
INTRAVENOUS | Status: AC
  Filled 2023-05-16: qty 100

## 2023-05-16 MED ORDER — LIDOCAINE HCL (PF) 2 % IJ SOLN
INTRAMUSCULAR | Status: AC
  Filled 2023-05-16: qty 5

## 2023-05-16 MED ORDER — SODIUM CHLORIDE 0.9 % IV SOLN
2.0000 g | INTRAVENOUS | Status: AC
  Administered 2023-05-16: 2 g via INTRAVENOUS
  Filled 2023-05-16: qty 20

## 2023-05-16 MED ORDER — ONDANSETRON HCL 4 MG/2ML IJ SOLN
INTRAMUSCULAR | Status: DC | PRN
  Administered 2023-05-16: 4 mg via INTRAVENOUS

## 2023-05-16 MED ORDER — FENTANYL CITRATE (PF) 100 MCG/2ML IJ SOLN: INTRAMUSCULAR | Status: DC | PRN

## 2023-05-16 MED ORDER — DEXMEDETOMIDINE HCL IN NACL 80 MCG/20ML IV SOLN
INTRAVENOUS | Status: DC | PRN
  Administered 2023-05-16: 4 ug via INTRAVENOUS

## 2023-05-16 MED ORDER — LACTATED RINGERS IV SOLN
INTRAVENOUS | Status: DC | PRN
Start: 2023-05-16 — End: 2023-05-16

## 2023-05-16 MED ORDER — LIDOCAINE HCL (CARDIAC) PF 100 MG/5ML IV SOSY
PREFILLED_SYRINGE | INTRAVENOUS | Status: DC | PRN
  Administered 2023-05-16: 100 mg via INTRATRACHEAL

## 2023-05-16 MED ORDER — DEXMEDETOMIDINE HCL IN NACL 80 MCG/20ML IV SOLN
INTRAVENOUS | Status: AC
  Filled 2023-05-16: qty 20

## 2023-05-16 MED ORDER — FENTANYL CITRATE PF 50 MCG/ML IJ SOSY
25.0000 ug | PREFILLED_SYRINGE | INTRAMUSCULAR | Status: DC | PRN
  Administered 2023-05-16 (×2): 50 ug via INTRAVENOUS

## 2023-05-16 MED ORDER — OXYCODONE HCL 5 MG/5ML PO SOLN: 5.0000 mg | Freq: Once | ORAL | Status: AC | PRN

## 2023-05-16 MED ORDER — CHLORHEXIDINE GLUCONATE 0.12 % MT SOLN
15.0000 mL | Freq: Once | OROMUCOSAL | Status: AC
  Administered 2023-05-16: 15 mL via OROMUCOSAL

## 2023-05-16 MED ORDER — SODIUM CHLORIDE 0.9 % IR SOLN
Status: DC | PRN
  Administered 2023-05-16: 12000 mL via INTRAVESICAL

## 2023-05-16 MED ORDER — ONDANSETRON HCL 4 MG/2ML IJ SOLN: 4.0000 mg | Freq: Once | INTRAMUSCULAR | Status: DC | PRN

## 2023-05-16 MED ORDER — PHENYLEPHRINE HCL (PRESSORS) 10 MG/ML IV SOLN
INTRAVENOUS | Status: DC | PRN
  Administered 2023-05-16 (×3): 80 ug via INTRAVENOUS
  Administered 2023-05-16: 120 ug via INTRAVENOUS
  Administered 2023-05-16 (×2): 80 ug via INTRAVENOUS

## 2023-05-16 MED ORDER — ACETAMINOPHEN 10 MG/ML IV SOLN
1000.0000 mg | Freq: Once | INTRAVENOUS | Status: DC | PRN
  Administered 2023-05-16: 1000 mg via INTRAVENOUS

## 2023-05-16 MED ORDER — ONDANSETRON HCL 4 MG/2ML IJ SOLN
INTRAMUSCULAR | Status: AC
  Filled 2023-05-16: qty 2

## 2023-05-16 MED ORDER — METOCLOPRAMIDE HCL 5 MG/ML IJ SOLN
INTRAMUSCULAR | Status: AC
  Filled 2023-05-16: qty 2

## 2023-05-16 MED ORDER — ORAL CARE MOUTH RINSE: 15.0000 mL | Freq: Once | OROMUCOSAL | Status: AC

## 2023-05-16 MED ORDER — EPHEDRINE SULFATE (PRESSORS) 50 MG/ML IJ SOLN
INTRAMUSCULAR | Status: DC | PRN
  Administered 2023-05-16 (×5): 5 mg via INTRAVENOUS

## 2023-05-16 MED ORDER — DEXAMETHASONE SODIUM PHOSPHATE 10 MG/ML IJ SOLN
INTRAMUSCULAR | Status: DC | PRN
  Administered 2023-05-16: 10 mg via INTRAVENOUS

## 2023-05-16 MED ORDER — SUGAMMADEX SODIUM 200 MG/2ML IV SOLN
INTRAVENOUS | Status: DC | PRN
  Administered 2023-05-16: 200 mg via INTRAVENOUS

## 2023-05-16 MED ORDER — METOCLOPRAMIDE HCL 5 MG/ML IJ SOLN
10.0000 mg | Freq: Once | INTRAMUSCULAR | Status: AC
  Administered 2023-05-16: 10 mg via INTRAVENOUS

## 2023-05-16 MED ORDER — DEXAMETHASONE SODIUM PHOSPHATE 10 MG/ML IJ SOLN
INTRAMUSCULAR | Status: AC
  Filled 2023-05-16: qty 1

## 2023-05-16 MED ORDER — PROPOFOL 10 MG/ML IV BOLUS
INTRAVENOUS | Status: DC | PRN
  Administered 2023-05-16: 70 mg via INTRAVENOUS

## 2023-05-16 MED ORDER — ROCURONIUM BROMIDE 10 MG/ML (PF) SYRINGE
PREFILLED_SYRINGE | INTRAVENOUS | Status: AC
  Filled 2023-05-16: qty 10

## 2023-05-16 MED ORDER — PROPOFOL 10 MG/ML IV BOLUS
INTRAVENOUS | Status: AC
  Filled 2023-05-16: qty 20

## 2023-05-16 MED ORDER — ROCURONIUM BROMIDE 10 MG/ML (PF) SYRINGE
PREFILLED_SYRINGE | INTRAVENOUS | Status: DC | PRN
  Administered 2023-05-16: 70 mg via INTRAVENOUS

## 2023-05-16 MED ORDER — OXYCODONE HCL 5 MG PO TABS
5.0000 mg | ORAL_TABLET | Freq: Once | ORAL | Status: AC | PRN
  Administered 2023-05-16: 5 mg via ORAL

## 2023-05-16 SURGICAL SUPPLY — 13 items
BAG URINE DRAIN 2000ML AR STRL (UROLOGICAL SUPPLIES) IMPLANT
BAG URO CATCHER STRL LF (MISCELLANEOUS) ×2 IMPLANT
CATH FOLEY 2WAY SLVR 5CC 16FR (CATHETERS) IMPLANT
DRAPE FOOT SWITCH (DRAPES) ×2 IMPLANT
GLOVE SURG LX STRL 7.5 STRW (GLOVE) ×2 IMPLANT
GOWN STRL REUS W/ TWL XL LVL3 (GOWN DISPOSABLE) ×2 IMPLANT
KIT TURNOVER KIT A (KITS) IMPLANT
LOOP CUT BIPOLAR 24F LRG (ELECTROSURGICAL) IMPLANT
MANIFOLD NEPTUNE II (INSTRUMENTS) ×2 IMPLANT
PACK CYSTO (CUSTOM PROCEDURE TRAY) ×2 IMPLANT
TUBING CONNECTING 10 (TUBING) ×2 IMPLANT
TUBING UROLOGY SET (TUBING) ×2 IMPLANT
WATER STERILE IRR 500ML POUR (IV SOLUTION) IMPLANT

## 2023-05-16 NOTE — Discharge Instructions (Signed)
 Removal of the Foley-remove the Foley on Friday morning, May 18, 2022 as instructed.

## 2023-05-16 NOTE — Interval H&P Note (Signed)
 History and Physical Interval Note:  05/16/2023 9:47 AM  [name]  has presented today for surgery, with the diagnosis of BLADDER TUMOR.  The various methods of treatment have been discussed with the patient and family. After consideration of risks, benefits and other options for treatment, the patient has consented to  Procedure(s) with comments: TRANSURETHRAL RESECTION OF BLADDER TUMOR (TURBT) with GEMCITABINE (N/A) - 60 MINUTE CASE as a surgical intervention.  The patient's history has been reviewed, patient examined, no change in status, stable for surgery.  I have reviewed the patient's chart and labs.  Questions were answered to the patient's satisfaction.  She denies any bladder pain or fever.  No cough cold or congestion.  She does have some intermediate gross hematuria.  No clots.   Jerilee Field

## 2023-05-16 NOTE — Anesthesia Postprocedure Evaluation (Signed)
 Anesthesia Post Note  Patient: JHADA RISK  Procedure(s) Performed: TRANSURETHRAL RESECTION OF BLADDER TUMOR (TURBT)     Patient location during evaluation: PACU Anesthesia Type: General Level of consciousness: awake and alert Pain management: pain level controlled Vital Signs Assessment: post-procedure vital signs reviewed and stable Respiratory status: spontaneous breathing, nonlabored ventilation, respiratory function stable and patient connected to nasal cannula oxygen Cardiovascular status: blood pressure returned to baseline and stable Postop Assessment: no apparent nausea or vomiting Anesthetic complications: no   No notable events documented.  Last Vitals:  Vitals:   05/16/23 1415 05/16/23 1430  BP: 121/70 (!) 144/84  Pulse: (!) 59 60  Resp: 10 16  Temp:    SpO2: 96% 92%    Last Pain:  Vitals:   05/16/23 1430  TempSrc:   PainSc: 4                  Mariann Barter

## 2023-05-16 NOTE — Op Note (Signed)
 Preoperative diagnosis: Posterior bladder cancer Postoperative diagnosis: Posterior bladder cancer  Procedure: TURBT greater than 5 cm  Surgeon: Mena Goes  Anesthesia: General  Indication for procedure: Pallavi is an 83 year old female with a long history of recurrent nonmuscle invasive bladder cancer.  She has been under surveillance and we noted more papillary tumor on recent office cystoscopy.  She is brought today for TURBT.  Findings: On exam she had moderate vaginal atrophy but no lesions of the introitus.  The meatus appeared normal.  On bimanual the bladder and the urethra were palpably normal.  I did not palpate a specific mass.  On cystoscopy there was a nodular broad-based high grade tumor of the posterior superior bladder wall.  This was not typical of her recurrences which have been multifocal papillary tumors on a stalk or short base.  This was more broad-based and infiltrating and isolated.  The remainder of her bladder was tumor free.  Description of procedure: After consent was obtained patient brought to the operating room.  After adequate anesthesia she is placed in lithotomy position and prepped and draped in the usual sterile fashion.  Timeout was performed to confirm the patient and procedure.  The continuous-flow sheath with the visual obturator was passed per urethra the bladder was irrigated 4-5 times to clear out some mild hematuria.  Once I got a better visual there was a nodular broad-based tumor in the posterior superior bladder.  The remainder of her bladder was clear.  I withdrew the scope and performed an exam.  I then replaced the scope.  The visual obturator was swapped out for the loop and handle.  I started from superior to inferior, left to right and resected the tumor.  This was then his posterior bladder tumor.  I then resected the base but it was difficult to tell if there was more tumor or if I was in to clean muscle.  Her bladder has been thin in places so I was  cautious not to perforate.  After complete resection and fulguration the rest of the bladder was again inspected.  At low pressure hemostasis was excellent.  I was not concerned about a perforation at the end of the case.  Given the larger surface area plan to leave a Foley catheter for a few days.  The scope was removed and a 16 [place] Foley catheter was placed left to gravity drainage.  Urine was clear.  She was awakened taken the cover room in stable condition.  Complications: None  Blood loss: Minimal  Specimens to pathology: #1 posterior bladder tumor #2 posterior bladder tumor base  Drains: 16 French Foley catheter  Disposition: Patient stable to PACU.

## 2023-05-16 NOTE — Transfer of Care (Signed)
 Immediate Anesthesia Transfer of Care Note  Patient: [name]  Procedure(s) Performed: TRANSURETHRAL RESECTION OF BLADDER TUMOR (TURBT)  Patient Location: PACU  Anesthesia Type:General  Level of Consciousness: awake, alert , and oriented  Airway & Oxygen Therapy: Patient Spontanous Breathing and Patient connected to nasal cannula oxygen  Post-op Assessment: Report given to RN and Post -op Vital signs reviewed and stable  Post vital signs: Reviewed and stable  Last Vitals:  Vitals Value Taken Time  BP 163/100 05/16/23 1310  Temp    Pulse 65 05/16/23 1314  Resp 12 05/16/23 1314  SpO2 100 % 05/16/23 1314  Vitals shown include unfiled device data.  Last Pain:  Vitals:   05/16/23 0947  TempSrc: Oral         Complications: No notable events documented.

## 2023-05-16 NOTE — Anesthesia Procedure Notes (Signed)
 Procedure Name: Intubation Date/Time: 05/16/2023 11:49 AM  Performed by: Hulan Fess, CRNAPre-anesthesia Checklist: Patient identified, Emergency Drugs available, Suction available and Patient being monitored Patient Re-evaluated:Patient Re-evaluated prior to induction Oxygen Delivery Method: Circle system utilized Preoxygenation: Pre-oxygenation with 100% oxygen Induction Type: IV induction Ventilation: Mask ventilation without difficulty Laryngoscope Size: 3 and Mac Grade View: Grade I Tube type: Oral Tube size: 7.0 mm Number of attempts: 1 Airway Equipment and Method: Stylet and Oral airway Placement Confirmation: ETT inserted through vocal cords under direct vision, positive ETCO2 and breath sounds checked- equal and bilateral Secured at: 20 cm Tube secured with: Tape Dental Injury: Teeth and Oropharynx as per pre-operative assessment

## 2023-05-17 ENCOUNTER — Other Ambulatory Visit: Payer: Self-pay | Admitting: Internal Medicine

## 2023-05-17 ENCOUNTER — Telehealth: Payer: Self-pay | Admitting: Student in an Organized Health Care Education/Training Program

## 2023-05-17 DIAGNOSIS — M5416 Radiculopathy, lumbar region: Secondary | ICD-10-CM

## 2023-05-17 DIAGNOSIS — M47816 Spondylosis without myelopathy or radiculopathy, lumbar region: Secondary | ICD-10-CM

## 2023-05-17 DIAGNOSIS — G894 Chronic pain syndrome: Secondary | ICD-10-CM

## 2023-05-17 LAB — SURGICAL PATHOLOGY

## 2023-05-17 NOTE — Telephone Encounter (Signed)
 Care taker Annice Pih called stating patient had surgery 05-16-23 is the reason she could not come in for medication appointment. Patient is asking if she can do a phone call with someone and get meds sent in until she can get appointent to come in to the office. Please check with Dr Pernell Dupre and see if he will do this.

## 2023-05-18 ENCOUNTER — Telehealth: Payer: Self-pay

## 2023-05-18 NOTE — Telephone Encounter (Signed)
 Dr Cherylann Ratel, Patients daughter came in this morning and was concerned about how to move forward with her moms care.  She is the power of attorney and feels like her mom is not processing what we are telling her and goes into panic mode.  She is out of her medications and she had surgery yesterday but was not given any pain meds.  We scheduled her for a virtual visit on Tuesday but informed the daughter that she needed to be there for the appointment.  She says her mom can't get here for appts.  I told her that she was going to have to get here because our rules required that we have face to face appointments.  I told her that she would need to come in with her mom for her appts so that she could hear what the plan was and help her mom process the information.  She was very nice and having elderly parents, I understood where she was coming from.  Today is her first day without her pain medication and the daughter Toniann Fail was concerned.  I informed her that you were not in the office but I would send you a message.

## 2023-05-19 ENCOUNTER — Other Ambulatory Visit: Payer: Self-pay | Admitting: Urology

## 2023-05-19 ENCOUNTER — Emergency Department (HOSPITAL_COMMUNITY)
Admission: EM | Admit: 2023-05-19 | Discharge: 2023-05-19 | Disposition: A | Discharge status: Home / Self Care | Payer: Medicare Other | Attending: Emergency Medicine | Admitting: Emergency Medicine

## 2023-05-19 ENCOUNTER — Encounter (HOSPITAL_COMMUNITY): Payer: Self-pay

## 2023-05-19 ENCOUNTER — Other Ambulatory Visit: Payer: Self-pay

## 2023-05-19 ENCOUNTER — Emergency Department (HOSPITAL_COMMUNITY): Payer: Medicare Other

## 2023-05-19 DIAGNOSIS — I10 Essential (primary) hypertension: Secondary | ICD-10-CM | POA: Diagnosis not present

## 2023-05-19 DIAGNOSIS — I700 Atherosclerosis of aorta: Secondary | ICD-10-CM | POA: Diagnosis not present

## 2023-05-19 DIAGNOSIS — R079 Chest pain, unspecified: Secondary | ICD-10-CM | POA: Diagnosis not present

## 2023-05-19 DIAGNOSIS — R109 Unspecified abdominal pain: Secondary | ICD-10-CM | POA: Diagnosis not present

## 2023-05-19 DIAGNOSIS — C674 Malignant neoplasm of posterior wall of bladder: Secondary | ICD-10-CM

## 2023-05-19 DIAGNOSIS — R42 Dizziness and giddiness: Secondary | ICD-10-CM | POA: Diagnosis not present

## 2023-05-19 DIAGNOSIS — R1030 Lower abdominal pain, unspecified: Secondary | ICD-10-CM | POA: Diagnosis not present

## 2023-05-19 DIAGNOSIS — R110 Nausea: Secondary | ICD-10-CM | POA: Diagnosis not present

## 2023-05-19 DIAGNOSIS — R339 Retention of urine, unspecified: Secondary | ICD-10-CM | POA: Insufficient documentation

## 2023-05-19 DIAGNOSIS — R1084 Generalized abdominal pain: Secondary | ICD-10-CM | POA: Diagnosis not present

## 2023-05-19 LAB — BASIC METABOLIC PANEL
Anion gap: 14 (ref 5–15)
BUN: 10 mg/dL (ref 8–23)
CO2: 24 mmol/L (ref 22–32)
Calcium: 9.4 mg/dL (ref 8.9–10.3)
Chloride: 94 mmol/L — ABNORMAL LOW (ref 98–111)
Creatinine, Ser: 0.85 mg/dL (ref 0.44–1.00)
GFR, Estimated: >60 mL/min (ref >60)
Glucose, Bld: 108 mg/dL — ABNORMAL HIGH (ref 70–99)
Potassium: 3.5 mmol/L (ref 3.5–5.1)
Sodium: 132 mmol/L — ABNORMAL LOW (ref 135–145)

## 2023-05-19 LAB — CBC
HCT: 43.3 % (ref 36.0–46.0)
Hemoglobin: 14.5 g/dL (ref 12.0–15.0)
MCH: 30.2 pg (ref 26.0–34.0)
MCHC: 33.5 g/dL (ref 30.0–36.0)
MCV: 90.2 fL (ref 80.0–100.0)
Platelets: 179 10*3/uL (ref 150–400)
RBC: 4.8 MIL/uL (ref 3.87–5.11)
RDW: 14.2 % (ref 11.5–15.5)
WBC: 8.9 10*3/uL (ref 4.0–10.5)
nRBC: 0 % (ref 0.0–0.2)

## 2023-05-19 LAB — URINALYSIS, ROUTINE W REFLEX MICROSCOPIC
Bacteria, UA: NONE SEEN
Bilirubin Urine: NEGATIVE
Glucose, UA: NEGATIVE mg/dL
Ketones, ur: 5 mg/dL — AB
Nitrite: NEGATIVE
Protein, ur: 100 mg/dL — AB
RBC / HPF: >50 RBC/hpf (ref 0–5)
Specific Gravity, Urine: 1.02 (ref 1.005–1.030)
pH: 5 (ref 5.0–8.0)

## 2023-05-19 LAB — TROPONIN I (HIGH SENSITIVITY): Troponin I (High Sensitivity): 49 ng/L — ABNORMAL HIGH (ref <18)

## 2023-05-19 MED ORDER — ONDANSETRON 4 MG PO TBDP
4.0000 mg | ORAL_TABLET | Freq: Once | ORAL | Status: AC
  Administered 2023-05-19: 4 mg via ORAL
  Filled 2023-05-19: qty 1

## 2023-05-19 MED ORDER — OXYCODONE-ACETAMINOPHEN 5-325 MG PO TABS
2.0000 | ORAL_TABLET | Freq: Once | ORAL | Status: DC
  Filled 2023-05-19: qty 2

## 2023-05-19 MED ORDER — IOHEXOL 350 MG/ML SOLN
75.0000 mL | Freq: Once | INTRAVENOUS | Status: AC | PRN
  Administered 2023-05-19: 75 mL via INTRAVENOUS

## 2023-05-19 MED ORDER — MORPHINE SULFATE (PF) 2 MG/ML IV SOLN
2.0000 mg | Freq: Once | INTRAVENOUS | Status: AC
  Administered 2023-05-19: 2 mg via INTRAVENOUS
  Filled 2023-05-19: qty 1

## 2023-05-19 MED ORDER — MORPHINE SULFATE (PF) 4 MG/ML IV SOLN
4.0000 mg | INTRAVENOUS | Status: DC | PRN
  Administered 2023-05-19: 4 mg via INTRAVENOUS
  Filled 2023-05-19: qty 1

## 2023-05-19 MED ORDER — OXYCODONE HCL 5 MG PO TABS: 5.0000 mg | ORAL_TABLET | ORAL | 0 refills | Status: DC | PRN

## 2023-05-19 NOTE — ED Triage Notes (Signed)
 Pt bib ems from home; hx bladder cancer; procedure done Tuesday, went home with catheter that was removed today; once removed, pt started co LLQ abd pain, tender with palpation nausea, back pain, generalized weakness, dizziness with standing; bp 110/80, hr 94, pacemaker present; 96% RA, cbg 130; no other neuro deficits, stroke screen negative, a and o x 4; no emesis, no fevers; catheter removed between 10 and 11 a today, has not been able to urinate more than a very small amount

## 2023-05-19 NOTE — ED Provider Triage Note (Signed)
 Emergency Medicine Provider Triage Evaluation Note  [name] , a 83 y.o. female  was evaluated in triage.  Pt complains of suprapubic pain and nausea w/lightheadedness after getting catheter out today. Denies chest pain or constipation..  Review of Systems  Positive: Suprapubic pain Negative: confusion  Physical Exam  BP 102/82   Pulse 100   Temp 97.7 F (36.5 C) (Oral)   Resp 18   SpO2 95%  Gen:   Awake, no distress   Resp:  Normal effort  MSK:   Moves extremities without difficulty  Other:  +ttp of suprapubic region  Medical Decision Making  Medically screening exam initiated at 6:18 PM.  Appropriate orders placed.  JAMINA MACBETH was informed that the remainder of the evaluation will be completed by another provider, this initial triage assessment does not replace that evaluation, and the importance of remaining in the ED until their evaluation is complete.    Pete Pelt, [place] 05/19/23 1819

## 2023-05-19 NOTE — ED Provider Notes (Signed)
 [place] EMERGENCY DEPARTMENT AT [facility] Provider Note   CSN: [id number] Arrival date & time: 05/19/23  1805     History No chief complaint on file.   HPI [name] is a 83 y.o. female presenting for chief complaint of AMS progressively throughout the past 24 hours. Had her foley taken out taken out this AM.  Family states that she has been more sleepy today since procedure syncope she has been completely unable to void throughout the day. She is endorsing pretty severe flank pain radiating up into her lower abdomen.  Denies any chest pain shortness of breath fatigue malaise.  Missed her medications today because she was waiting in the emergency room.   Patient's recorded medical, surgical, social, medication list and allergies were reviewed in the Snapshot window as part of the initial history.   Review of Systems   Review of Systems  Constitutional:  Negative for chills and fever.  HENT:  Negative for ear pain and sore throat.   Eyes:  Negative for pain and visual disturbance.  Respiratory:  Negative for cough and shortness of breath.   Cardiovascular:  Positive for chest pain. Negative for palpitations.  Gastrointestinal:  Negative for abdominal pain and vomiting.  Genitourinary:  Negative for dysuria and hematuria.  Musculoskeletal:  Negative for arthralgias and back pain.  Skin:  Negative for color change and rash.  Neurological:  Negative for seizures and syncope.  All other systems reviewed and are negative.   Physical Exam Updated Vital Signs BP 92/67   Pulse 95   Temp 98.1 F (36.7 C) (Oral)   Resp 18   SpO2 95%  Physical Exam Vitals and nursing note reviewed.  Constitutional:      General: She is not in acute distress.    Appearance: She is well-developed.  HENT:     Head: Normocephalic and atraumatic.  Eyes:     Conjunctiva/sclera: Conjunctivae normal.  Cardiovascular:     Rate and Rhythm: Normal rate and regular rhythm.     Heart  sounds: No murmur heard. Pulmonary:     Effort: Pulmonary effort is normal. No respiratory distress.     Breath sounds: Normal breath sounds.  Abdominal:     General: There is no distension.     Palpations: Abdomen is soft.     Tenderness: There is no abdominal tenderness. There is no right CVA tenderness or left CVA tenderness.  Musculoskeletal:        General: No swelling or tenderness. Normal range of motion.     Cervical back: Neck supple.  Skin:    General: Skin is warm and dry.  Neurological:     General: No focal deficit present.     Mental Status: She is alert and oriented to person, place, and time. Mental status is at baseline.     Cranial Nerves: No cranial nerve deficit.      ED Course/ Medical Decision Making/ A&P    Procedures Procedures   Medications Ordered in ED Medications  oxyCODONE-acetaminophen (PERCOCET/ROXICET) 5-325 MG per tablet 2 tablet (2 tablets Oral Not Given 05/19/23 1904)  morphine (PF) 4 MG/ML injection 4 mg (4 mg Intravenous Given 05/19/23 2032)  ondansetron (ZOFRAN-ODT) disintegrating tablet 4 mg (4 mg Oral Given 05/19/23 1915)  morphine (PF) 2 MG/ML injection 2 mg (2 mg Intravenous Given 05/19/23 1915)  iohexol (OMNIPAQUE) 350 MG/ML injection 75 mL (75 mLs Intravenous Contrast Given 05/19/23 2213)   Medical Decision Making:   Bianca Savannah  Anderson is a 83 y.o. female who presented to the ED today with altered mental status detailed above.    Additional history discussed with patient's family/caregivers.  Patient placed on continuous vitals and telemetry monitoring while in ED which was reviewed periodically.  Complete initial physical exam performed, notably the patient  was HDS in NAD.    Reviewed and confirmed nursing documentation for past medical history, family history, social history.    Initial Assessment:   With the patient's presentation of altered mental status, most likely diagnosis is delerium 2/2 infectious etiology (UTI/CAP/URI) vs  metabolic abnormality (Na/K/Mg/Ca) vs nonspecific etiology. Other diagnoses were considered including (but not limited to) CVA, ICH, intracranial mass, critical dehydration, heptatic dysfunction, uremia, hypercarbia, intoxication, endrocrine abnormality, toxidrome. These are considered less likely due to history of present illness and physical exam findings.   This is most consistent with an acute life/limb threatening illness complicated by underlying chronic conditions.  Initial Plan:  CTA CAP for concern for lower flank pain radiating into her back that is much worse than baseline.  Evaluating for AAA, large mass, PE, infectious pathology Screening labs including CBC and Metabolic panel to evaluate for infectious or metabolic etiology of disease.  Urinalysis with reflex culture ordered to evaluate for UTI or relevant urologic/nephrologic pathology.  EKG and troponin to evaluate for cardiac syndrome. Point-of-care ultrasound of the bladder demonstrated 400 cc.  Treated with attempted void without success and ultimately Foley catheterization Objective evaluation as below reviewed   Initial Study Results:   Laboratory  All laboratory results reviewed without evidence of clinically relevant pathology.     EKG EKG was reviewed independently. Rate, rhythm, axis, intervals all examined and without medically relevant abnormality. ST segments without concerns for elevations.    Radiology:  All images reviewed independently. Agree with radiology report at this time.   CT ANGIO CHEST/ABD/PEL FOR DISSECTION W &/OR WO CONTRAST Result Date: 05/19/2023 CLINICAL DATA:  Chest and abdominal pain with dizziness, initial encounter EXAM: CT ANGIOGRAPHY CHEST, ABDOMEN AND PELVIS TECHNIQUE: Non-contrast CT of the chest was initially obtained. Multidetector CT imaging through the chest, abdomen and pelvis was performed using the standard protocol during bolus administration of intravenous contrast. Multiplanar  reconstructed images and MIPs were obtained and reviewed to evaluate the vascular anatomy. RADIATION DOSE REDUCTION: This exam was performed according to the departmental dose-optimization program which includes automated exposure control, adjustment of the mA and/or kV according to patient size and/or use of iterative reconstruction technique. CONTRAST:  75mL OMNIPAQUE IOHEXOL 350 MG/ML SOLN COMPARISON:  01/20/2023 FINDINGS: CTA CHEST FINDINGS Cardiovascular: Initial precontrast images demonstrate atherosclerotic calcifications of the thoracic aorta. No aneurysmal dilatation is seen. No hyperdense crescent to suggest acute aortic injury is noted. Post-contrast images demonstrate no evidence of aortic dissection. Calcifications of the aortic valve are noted. Coronary calcifications are seen as well. The pulmonary artery as visualized is within normal limits although not timed for embolus evaluation. Defibrillator is noted. Mediastinum/Nodes: Thoracic inlet is within normal limits. No hilar or mediastinal adenopathy is noted. The esophagus as visualized is within normal limits. Lungs/Pleura: Lungs are well aerated bilaterally. Diffuse emphysematous changes are seen. Musculoskeletal: Degenerative changes of the thoracic spine are noted. No acute rib abnormality is noted. Stimulator device is noted in the midthoracic spine. Review of the MIP images confirms the above findings. CTA ABDOMEN AND PELVIS FINDINGS VASCULAR Aorta: Atherosclerotic calcifications are noted without aneurysmal dilatation or dissection. Celiac: Celiac axis is patent with mild atherosclerotic calcifications. There is a vague  filling defect identified within the central portion of the celiac axis suspicious for a small thrombus. This was not well visualized on the prior exam. No significant flow limitation is noted. SMA: Mild atherosclerotic calcifications are noted without focal stenosis. Renals: Atherosclerotic plaque is noted at the origins of  the renal arteries bilaterally. No focal hemodynamically significant stenosis is noted. IMA: Patent without evidence of aneurysm, dissection, vasculitis or significant stenosis. Inflow: Iliacs demonstrate atherosclerotic calcifications without aneurysmal dilatation. Veins: No specific venous abnormality is noted. Review of the MIP images confirms the above findings. NON-VASCULAR Hepatobiliary: Gallbladder is well distended without evidence of cholelithiasis. The liver is within normal limits. Pancreas: Unremarkable. No pancreatic ductal dilatation or surrounding inflammatory changes. Spleen: Normal in size without focal abnormality. Adrenals/Urinary Tract: Adrenal glands are within normal limits. Kidneys show no renal calculi or obstructive changes. The bladder is decompressed by Foley catheter. Air is noted in the bladder related to the recent instrumentation. The Foley catheter is coiled within the bladder. This could be withdrawn somewhat as clinically necessary. Stomach/Bowel: Scattered diverticular change of the colon is noted without evidence of diverticulitis. No obstructive or inflammatory changes of the colon are seen. The stomach and small bowel are unremarkable. Appendix is well visualized and within normal limits. Lymphatic: No adenopathy is noted. Reproductive: Uterus and bilateral adnexa are unremarkable. Other: No abdominal wall hernia or abnormality. No abdominopelvic ascites. Musculoskeletal: Degenerative changes of lumbar spine are noted. Review of the MIP images confirms the above findings. IMPRESSION: CTA of the chest: No evidence of dissection or aneurysmal dilatation. CTA of the abdomen and pelvis: Vague filling defect identified in the proximal celiac axis which may represent a small thrombus or embolus. This is not flow limiting. Diverticulosis without diverticulitis. Foley catheter coiled within the bladder. This could be withdrawn as clinically indicated. Electronically Signed   By: Alcide Clever M.D.   On: 05/19/2023 22:29   CUP PACEART INCLINIC DEVICE CHECK Result Date: 05/04/2023 Normal in-clinic CRT-D check. Thresholds, sensing, impedance trend, and HF diagnostics are within normal limits or stable for patient over time. AT/AF <.1%, 1 Device flagged NSVT event that is atrial driven. 1 AF flagged event that was 2 hours 28 mins of  AFib. Overall good ventricular rate control.  Patient BiV pacing 91.43% of the time. HG shows small atrial  pacing percentage <than LRL of 60.  Reviewed with Dr. Graciela Husbands, no programming changes at this time. Estimated longevity 6.1 years . Pt enrolled in remote follow-up. Error saving report from USB - see full report scanned to MEDIA (EPIC).Syliva Overman, RN    Consults: Case discussed with Cardiology Dr. Orson Aloe.  They agree that EKG does not represent any acute disease compared to prior.   Final Assessment and Plan:   Patient's troponin mildly elevated though in the intermediate range.  Notably she was very tachycardic to the 130s on arrival.  She attributed this to being in severe pain.  Notably, she had been on oxycodone for years and her prescription ran out on Wednesday.  She has not taken any in the last 48 hours.  Tachycardic and noted to be with mildly dilated pupils and piloerection.  She appears to be in a very mild opiate withdrawal state at this time. She was treated with morphine and heart rate returned to normal, patient's pain returned to controlled. All other lab work without focal pathology.  She does have some inflammatory cells in her urine but just had a procedure done recently is already on  antibiotics. Will not change her outpatient treatment regimen.  Urinary retention treated with catheterization.  CTA did not show any acute pathology. Discussed with family at bedside.  She has been observed for 5 hours in the emergency room and abdominal pain is completely resolved.  Initial troponin intermediately elevated but EKG within normal  limits as for her. Discussed repeating her troponin but given resolution of syndrome, atypical nature of her presentation concerning cardiac pathology, patient was feeling comfortable outpatient care management at this time. Will send her breakthrough pain medication in outpatient setting to prevent recurrent withdrawal, plan for patient follow-up with urology within a week and primary care doctor as well.  Disposition:  I have considered need for hospitalization, however, considering all of the above, I believe this patient is stable for discharge at this time.  Patient/family educated about specific return precautions for given chief complaint and symptoms.  Patient/family educated about follow-up with PCP.     Patient/family expressed understanding of return precautions and need for follow-up. Patient spoken to regarding all imaging and laboratory results and appropriate follow up for these results. All education provided in verbal form with additional information in written form. Time was allowed for answering of patient questions. Patient discharged.    Emergency Department Medication Summary:   Medications  oxyCODONE-acetaminophen (PERCOCET/ROXICET) 5-325 MG per tablet 2 tablet (2 tablets Oral Not Given 05/19/23 1904)  morphine (PF) 4 MG/ML injection 4 mg (4 mg Intravenous Given 05/19/23 2032)  ondansetron (ZOFRAN-ODT) disintegrating tablet 4 mg (4 mg Oral Given 05/19/23 1915)  morphine (PF) 2 MG/ML injection 2 mg (2 mg Intravenous Given 05/19/23 1915)  iohexol (OMNIPAQUE) 350 MG/ML injection 75 mL (75 mLs Intravenous Contrast Given 05/19/23 2213)        Clinical Impression:  1. Urinary retention      Discharge   Final Clinical Impression(s) / ED Diagnoses Final diagnoses:  Urinary retention    Rx / DC Orders ED Discharge Orders          Ordered    oxyCODONE (ROXICODONE) 5 MG immediate release tablet  Every 4 hours PRN        05/19/23 2258              Glyn Ade, MD 05/19/23 2259

## 2023-05-20 ENCOUNTER — Telehealth (HOSPITAL_COMMUNITY): Payer: Self-pay | Admitting: Emergency Medicine

## 2023-05-20 MED ORDER — OXYCODONE HCL 5 MG PO TABS: 5.0000 mg | ORAL_TABLET | ORAL | 0 refills | Status: DC | PRN

## 2023-05-20 NOTE — Telephone Encounter (Signed)
 [facility] did not have the prescription for oxycodone.  [facility]

## 2023-05-22 ENCOUNTER — Ambulatory Visit (INDEPENDENT_AMBULATORY_CARE_PROVIDER_SITE_OTHER)

## 2023-05-22 ENCOUNTER — Encounter: Payer: Self-pay | Admitting: Student in an Organized Health Care Education/Training Program

## 2023-05-22 DIAGNOSIS — I428 Other cardiomyopathies: Secondary | ICD-10-CM | POA: Diagnosis not present

## 2023-05-22 DIAGNOSIS — T83091A Other mechanical complication of indwelling urethral catheter, initial encounter: Secondary | ICD-10-CM | POA: Diagnosis not present

## 2023-05-22 DIAGNOSIS — I5022 Chronic systolic (congestive) heart failure: Secondary | ICD-10-CM

## 2023-05-23 ENCOUNTER — Ambulatory Visit
Payer: Medicare Other | Attending: Student in an Organized Health Care Education/Training Program | Admitting: Student in an Organized Health Care Education/Training Program

## 2023-05-23 DIAGNOSIS — M5416 Radiculopathy, lumbar region: Secondary | ICD-10-CM | POA: Diagnosis not present

## 2023-05-23 DIAGNOSIS — G894 Chronic pain syndrome: Secondary | ICD-10-CM

## 2023-05-23 DIAGNOSIS — M47816 Spondylosis without myelopathy or radiculopathy, lumbar region: Secondary | ICD-10-CM

## 2023-05-23 DIAGNOSIS — K6289 Other specified diseases of anus and rectum: Secondary | ICD-10-CM

## 2023-05-23 DIAGNOSIS — M48062 Spinal stenosis, lumbar region with neurogenic claudication: Secondary | ICD-10-CM

## 2023-05-23 DIAGNOSIS — Z9689 Presence of other specified functional implants: Secondary | ICD-10-CM

## 2023-05-23 MED ORDER — OXYCODONE-ACETAMINOPHEN 10-325 MG PO TABS: 1.0000 | ORAL_TABLET | ORAL | 0 refills | Status: AC | PRN

## 2023-05-23 NOTE — Progress Notes (Signed)
 Patient: [name]  Service Category: E/M  Provider: Edward Jolly, MD  DOB: 09/25/1940  DOS: 05/23/2023  Location: Office  MRN: [id number]  Setting: Ambulatory outpatient  Referring Provider: Sherlene Shams, MD  Type: Established Patient  Specialty: Interventional Pain Management  PCP: Sherlene Shams, MD  Location: Remote location  Delivery: TeleHealth     Virtual Encounter - Pain Management PROVIDER NOTE: Information contained herein reflects review and annotations entered in association with encounter. Interpretation of such information and data should be left to medically-trained personnel. Information provided to patient can be located elsewhere in the medical record under "Patient Instructions". Document created using STT-dictation technology, any transcriptional errors that may result from process are unintentional.    Contact & Pharmacy Preferred: [phone] Home: [phone] (home) Mobile: [phone] (mobile) E-mail: [id number] [place], [place] - [address] AT [facility] [address] [place] [address] Phone: [phone] Fax: [phone]  [id number] [place], [place] - [address] AT [facility] [address] [place] [address] Phone: [phone] Fax: [phone]  [facility] - [place], [place] - [address] [place] [address] Phone: [phone] Fax: [phone]   Pre-screening  [name] offered "in-person" vs "virtual" encounter. She indicated preferring virtual for this encounter.   Reason COVID-19*  Social distancing based on CDC and AMA recommendations.   I contacted [name] on 05/23/2023 via telephone.      I clearly identified myself as Edward Jolly, MD. I verified that I was speaking with the correct person using two identifiers (Name: [name], and date of birth:  Jul 22, 1940).  Consent I sought verbal advanced consent from [name] for virtual visit interactions. I informed [name] of possible security and privacy concerns, risks, and limitations associated with providing "not-in-person" medical evaluation and management services. I also informed [name] of the availability of "in-person" appointments. Finally, I informed her that there would be a charge for the virtual visit and that she could be  personally, fully or partially, financially responsible for it. [name] expressed understanding and agreed to proceed.   Historic Elements   [name] is a 83 y.o. year old, female patient evaluated today after our last contact on 05/17/2023. [name]  has a past medical history of AICD (automatic cardioverter/defibrillator) present (EP cardiologist--- dr Graciela Husbands ), Anxiety, Arthritis, Bladder cancer [facility]) (urologist-  dr Mena Goes), Chronic hyponatremia, Chronic low back pain with bilateral sciatica, Chronic systolic (congestive) heart failure (HCC), 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CC), Coronary artery disease (cardiologist-  dr Lorine Bears), DDD (degenerative disc disease), thoracolumbar, Depression, Frequent urination, Full dentures, Gait instability, GERD (gastroesophageal reflux disease), History of iron deficiency anemia (11/2013), History of stomach ulcers (11/2013), Hypertension, LBBB (left bundle branch block), NICM (nonischemic cardiomyopathy) (HCC) (last echo 12-31-2015 ef 55-60%), Nocturia more than twice per night, S/P insertion of spinal cord stimulator (followed by [facility]---- Dr Letitia Caul (notes in care everywhere)), Scoliosis, Uses walker (02/18/2021), and Wears glasses. She also  has a past surgical history that includes Esophagogastroduodenoscopy (N/A, 11/28/2013); Transurethral resection of bladder tumor (N/A, 12/13/2013); Transurethral resection of bladder tumor (N/A, 01/17/2014); Cataract extraction w/ intraocular lens  implant, bilateral  (Bilateral, right 12-2013 / left  02-2014); Cystoscopy with retrograde pyelogram, ureteroscopy and stent placement (Bilateral, 04/18/2014); Transurethral resection of bladder tumor (N/A, 04/18/2014); Lumbar  disc surgery (1980's); bi-ventricular implantable cardioverter defibrillator (N/A, 05/07/2014); Cystoscopy w/ retrogrades (Bilateral, 01/06/2015); Cystoscopy with biopsy (N/A, 10/06/2015); Cardiac catheterization (03-03-2014  dr Lorine Bears   [facility]); Transurethral resection of bladder (1995); Cardiovascular stress test (11/25/2013); transthoracic echocardiogram (12/31/2015   dr Graciela Husbands); Cystoscopy with fulgeration (N/A, 06/30/2017); Spinal cord stimulator implant (10-09-2018   @[facility] ); Cystoscopy with biopsy (N/A, 02/12/2019); Transurethral resection of bladder tumor (N/A, 12/13/2019); Transurethral resection of bladder tumor (N/A, 10/06/2020); colonscopy (11/2020); Cystoscopy with biopsy (N/A, 03/19/2021); ICD GENERATOR CHANGEOUT (N/A, 11/04/2021); Biv pacemaker generator change out; and Transurethral resection of bladder tumor (N/A, 11/30/2021). [name] has a current medication list which includes the following prescription(s): acetaminophen, amitriptyline, amlodipine, celecoxib, gabapentin, losartan, menthol (topical analgesic), metoprolol succinate, nitrofurantoin (macrocrystal-monohydrate), oxycodone-acetaminophen, pantoprazole, polyethylene glycol, promethazine, and duloxetine, and the following Facility-Administered Medications: epirubicin, gemcitabine, and gemcitabine. She  reports that she has been smoking cigarettes. She has a 28 pack-year smoking history. She has never used smokeless tobacco. She reports that she does not drink alcohol and does not use drugs. [name] has no known allergies.  BMI: Estimated body mass index is 17.11 kg/m as calculated from the following:   Height as of 05/16/23: 5' 8.75" (1.746 m).   Weight as of 05/16/23: 115 lb (52.2 kg). Last encounter: 04/13/2023. Last procedure:  06/20/2022.  HPI  Today, she is being contacted for medication management.  Discussed the use of AI scribe software for clinical note transcription with the patient, who gave verbal consent to proceed.  History of Present Illness   [name] is an 83 year old female who presents for VV medication refill. Her daughter [name]) is with her during the virtual encounter.  She has chronic pain requiring high-dose opioid medications for management. Her pain is likely related to a progressive condition such as cancer, which has increased her pain burden and opioid requirements.  She is seeking a medication refill but is unable to attend a face-to-face visit, which is typically required for refilling such medications. There was a missed appointment previously, complicating the refill process.  Her family member is involved in her care and is exploring options for managing her medication needs, including the possibility of transitioning to palliative care services.     Sh had a TURBT procedure for bladder cancer on 05/16/23. She did receive a small quantity of Oxycodone for her post-op pain . Pharmacotherapy Assessment   Opioid Analgesic: Percocet 10 mg up to 5 times a day as needed for severe breakthrough pain, quantity 150/month; MME equals 90   Monitoring: [place] PMP: PDMP reviewed during this encounter.       Pharmacotherapy: No side-effects or adverse reactions reported. Compliance: No problems identified. Effectiveness: Clinically acceptable. Plan: Refer to "POC". UDS:  Summary  Date Value Ref Range Status  01/11/2023 Note  Final    Comment:    ==================================================================== [employer] Select 13 (MW) ==================================================================== Test                             Result       Flag       Units  Drug Present and Declared for Prescription Verification   Oxycodone                      6116         EXPECTED   ng/mg  creat   Oxymorphone  5905         EXPECTED   ng/mg creat   Noroxycodone                   22714        EXPECTED   ng/mg creat   Noroxymorphone                 4419         EXPECTED   ng/mg creat    Sources of oxycodone are scheduled prescription medications.    Oxymorphone, noroxycodone, and noroxymorphone are expected    metabolites of oxycodone. Oxymorphone is also available as a    scheduled prescription medication.  ==================================================================== Test                      Result    Flag   Units      Ref Range   Creatinine              37               mg/dL      >=09 ==================================================================== Declared Medications:  The flagging and interpretation on this report are based on the  following declared medications.  Unexpected results may arise from  inaccuracies in the declared medications.   **Note: The testing scope of this panel includes these medications:   Oxycodone (Percocet)   **Note: The testing scope of this panel does not include the  following reported medications:   Acetaminophen (Tylenol)  Acetaminophen (Percocet)  Amitriptyline (Elavil)  Amlodipine (Norvasc)  Celecoxib (Celebrex)  Gabapentin  Losartan (Cozaar)  Menthol  Metoprolol (Toprol)  Pantoprazole (Protonix)  Polyethylene Glycol (MiraLAX)  Promethazine (Phenergan)  Sucralfate (Carafate) ==================================================================== For clinical consultation, please call [phone]. ====================================================================    No results found for: "CBDTHCR", "D8THCCBX", "D9THCCBX"   Laboratory Chemistry Profile   Renal Lab Results  Component Value Date   BUN 10 05/19/2023   CREATININE 0.85 05/19/2023   BCR 18 04/28/2014   GFR 81.41 02/16/2022   GFRAA >60 06/17/2019   GFRNONAA >60 05/19/2023    Hepatic Lab Results  Component Value Date    AST 38 02/21/2022   ALT 27 02/21/2022   ALBUMIN 3.8 02/21/2022   ALKPHOS 88 02/21/2022   LIPASE 30 11/23/2013    Electrolytes Lab Results  Component Value Date   NA 132 (L) 05/19/2023   K 3.5 05/19/2023   CL 94 (L) 05/19/2023   CALCIUM 9.4 05/19/2023   MG 2.3 06/03/2019    Bone Lab Results  Component Value Date   VD25OH 36.17 01/11/2019    Inflammation (CRP: Acute Phase) (ESR: Chronic Phase) No results found for: "CRP", "ESRSEDRATE", "LATICACIDVEN"       Note: Above Lab results reviewed.  Imaging  CT ANGIO CHEST/ABD/PEL FOR DISSECTION W &/OR WO CONTRAST CLINICAL DATA:  Chest and abdominal pain with dizziness, initial encounter  EXAM: CT ANGIOGRAPHY CHEST, ABDOMEN AND PELVIS  TECHNIQUE: Non-contrast CT of the chest was initially obtained.  Multidetector CT imaging through the chest, abdomen and pelvis was performed using the standard protocol during bolus administration of intravenous contrast. Multiplanar reconstructed images and MIPs were obtained and reviewed to evaluate the vascular anatomy.  RADIATION DOSE REDUCTION: This exam was performed according to the departmental dose-optimization program which includes automated exposure control, adjustment of the mA and/or kV according to patient size and/or use of iterative reconstruction technique.  CONTRAST:  75mL OMNIPAQUE IOHEXOL 350 MG/ML SOLN  COMPARISON:  01/20/2023  FINDINGS: CTA CHEST FINDINGS  Cardiovascular: Initial precontrast images demonstrate atherosclerotic calcifications of the thoracic aorta. No aneurysmal dilatation is seen. No hyperdense crescent to suggest acute aortic injury is noted. Post-contrast images demonstrate no evidence of aortic dissection. Calcifications of the aortic valve are noted. Coronary calcifications are seen as well. The pulmonary artery as visualized is within normal limits although not timed for embolus evaluation. Defibrillator is noted.  Mediastinum/Nodes:  Thoracic inlet is within normal limits. No hilar or mediastinal adenopathy is noted. The esophagus as visualized is within normal limits.  Lungs/Pleura: Lungs are well aerated bilaterally. Diffuse emphysematous changes are seen.  Musculoskeletal: Degenerative changes of the thoracic spine are noted. No acute rib abnormality is noted. Stimulator device is noted in the midthoracic spine.  Review of the MIP images confirms the above findings.  CTA ABDOMEN AND PELVIS FINDINGS  VASCULAR  Aorta: Atherosclerotic calcifications are noted without aneurysmal dilatation or dissection.  Celiac: Celiac axis is patent with mild atherosclerotic calcifications. There is a vague filling defect identified within the central portion of the celiac axis suspicious for a small thrombus. This was not well visualized on the prior exam. No significant flow limitation is noted.  SMA: Mild atherosclerotic calcifications are noted without focal stenosis.  Renals: Atherosclerotic plaque is noted at the origins of the renal arteries bilaterally. No focal hemodynamically significant stenosis is noted.  IMA: Patent without evidence of aneurysm, dissection, vasculitis or significant stenosis.  Inflow: Iliacs demonstrate atherosclerotic calcifications without aneurysmal dilatation.  Veins: No specific venous abnormality is noted.  Review of the MIP images confirms the above findings.  NON-VASCULAR  Hepatobiliary: Gallbladder is well distended without evidence of cholelithiasis. The liver is within normal limits.  Pancreas: Unremarkable. No pancreatic ductal dilatation or surrounding inflammatory changes.  Spleen: Normal in size without focal abnormality.  Adrenals/Urinary Tract: Adrenal glands are within normal limits. Kidneys show no renal calculi or obstructive changes. The bladder is decompressed by Foley catheter. Air is noted in the bladder related to the recent instrumentation. The Foley  catheter is coiled within the bladder. This could be withdrawn somewhat as clinically necessary.  Stomach/Bowel: Scattered diverticular change of the colon is noted without evidence of diverticulitis. No obstructive or inflammatory changes of the colon are seen. The stomach and small bowel are unremarkable. Appendix is well visualized and within normal limits.  Lymphatic: No adenopathy is noted.  Reproductive: Uterus and bilateral adnexa are unremarkable.  Other: No abdominal wall hernia or abnormality. No abdominopelvic ascites.  Musculoskeletal: Degenerative changes of lumbar spine are noted.  Review of the MIP images confirms the above findings.  IMPRESSION: CTA of the chest: No evidence of dissection or aneurysmal dilatation.  CTA of the abdomen and pelvis: Vague filling defect identified in the proximal celiac axis which may represent a small thrombus or embolus. This is not flow limiting.  Diverticulosis without diverticulitis.  Foley catheter coiled within the bladder. This could be withdrawn as clinically indicated.  Electronically Signed   By: Alcide Clever M.D.   On: 05/19/2023 22:29  Assessment  The primary encounter diagnosis was Lumbar radiculopathy. Diagnoses of Spinal stenosis, lumbar region, with neurogenic claudication, Lumbar facet arthropathy, Lumbar spondylosis, Spinal cord stimulator status, Pain, rectal, and Chronic pain syndrome were also pertinent to this visit.  Plan of Care  Assessment and Plan    Chronic Pain Management  A missed previous appointment complicates the situation. In-person visits for MM  are emphasized especially since patient is on high dose opioid  therapy (MME = 90). Alternative options such as palliative care, which could provide home visits and have different regulatory requirements, were discussed. Refill current pain medications for one month. Encourage discussion with Dr. Darrick Huntsman about a palliative care consult. Consider  palliative care for home visits and different regulatory requirements for narcotics.  Cancer Potential progression is increasing pain burden and narcotic requirements. A palliative care consult would be beneficial for managing progressive cancer and higher pain management needs. Encourage discussion with Dr. Darrick Huntsman about a palliative care consult. Consider palliative care for managing progressive cancer and higher opioid requirements.  Follow-up Schedule follow-up appointment.        [name] has a current medication list which includes the following long-term medication(s): amitriptyline, amlodipine, gabapentin, losartan, metoprolol succinate, pantoprazole, promethazine, and duloxetine.  Pharmacotherapy (Medications Ordered): Meds ordered this encounter  Medications   oxyCODONE-acetaminophen (PERCOCET) 10-325 MG tablet    Sig: Take 1 tablet by mouth every 4 (four) hours as needed for pain. Must last 30 days. Max tablets 5 per day    Dispense:  150 tablet    Refill:  0    Chronic Pain: STOP Act (Not applicable) Fill 1 day early if closed on refill date. Avoid benzodiazepines within 8 hours of opioids    Follow-up plan:   Return in about 30 days (around 06/22/2023) for MM, F2F, add to NP list.      Recent Visits Date Type Provider Dept  04/13/23 Office Visit Edward Jolly, MD [facility]  Showing recent visits within past 90 days and meeting all other requirements Today's Visits Date Type Provider Dept  05/23/23 Office Visit Edward Jolly, MD [facility]  Showing today's visits and meeting all other requirements Future Appointments No visits were found meeting these conditions. Showing future appointments within next 90 days and meeting all other requirements  I discussed the assessment and treatment plan with the patient. The patient was provided an opportunity to ask questions and all were answered. The patient agreed with the plan and demonstrated  an understanding of the instructions.  Patient advised to call back or seek an in-person evaluation if the symptoms or condition worsens.  Duration of encounter: .  Note by: Edward Jolly, MD Date: 05/23/2023; Time: 3:29 PM

## 2023-05-24 ENCOUNTER — Emergency Department
Admission: EM | Admit: 2023-05-24 | Discharge: 2023-05-24 | Disposition: A | Discharge status: Home / Self Care | Attending: Emergency Medicine | Admitting: Emergency Medicine

## 2023-05-24 ENCOUNTER — Other Ambulatory Visit: Payer: Self-pay

## 2023-05-24 ENCOUNTER — Other Ambulatory Visit: Payer: Self-pay | Admitting: Internal Medicine

## 2023-05-24 DIAGNOSIS — I10 Essential (primary) hypertension: Secondary | ICD-10-CM | POA: Diagnosis not present

## 2023-05-24 DIAGNOSIS — R339 Retention of urine, unspecified: Secondary | ICD-10-CM | POA: Diagnosis not present

## 2023-05-24 DIAGNOSIS — T83091A Other mechanical complication of indwelling urethral catheter, initial encounter: Secondary | ICD-10-CM | POA: Diagnosis not present

## 2023-05-24 DIAGNOSIS — Y732 Prosthetic and other implants, materials and accessory gastroenterology and urology devices associated with adverse incidents: Secondary | ICD-10-CM | POA: Insufficient documentation

## 2023-05-24 DIAGNOSIS — T83098A Other mechanical complication of other indwelling urethral catheter, initial encounter: Secondary | ICD-10-CM | POA: Diagnosis not present

## 2023-05-24 LAB — CUP PACEART REMOTE DEVICE CHECK
Battery Remaining Longevity: 72 mo
Battery Voltage: 2.98 V
Brady Statistic AP VP Percent: 20.87 %
Brady Statistic AP VS Percent: 0.33 %
Brady Statistic AS VP Percent: 75.61 %
Brady Statistic AS VS Percent: 3.19 %
Brady Statistic RA Percent Paced: 20.56 %
Brady Statistic RV Percent Paced: 92.86 %
Date Time Interrogation Session: 20250301013907
HighPow Impedance: 77 Ohm
Implantable Lead Connection Status: 753985
Implantable Lead Connection Status: 753985
Implantable Lead Connection Status: 753985
Implantable Lead Implant Date: 20160217
Implantable Lead Implant Date: 20160217
Implantable Lead Implant Date: 20160217
Implantable Lead Location: 753858
Implantable Lead Location: 753859
Implantable Lead Location: 753860
Implantable Lead Model: 4298
Implantable Lead Model: 5076
Implantable Pulse Generator Implant Date: 20230817
Lead Channel Impedance Value: 208.568
Lead Channel Impedance Value: 212.8 Ohm
Lead Channel Impedance Value: 234.706
Lead Channel Impedance Value: 247.358
Lead Channel Impedance Value: 253.333
Lead Channel Impedance Value: 399 Ohm
Lead Channel Impedance Value: 437 Ohm
Lead Channel Impedance Value: 437 Ohm
Lead Channel Impedance Value: 456 Ohm
Lead Channel Impedance Value: 456 Ohm
Lead Channel Impedance Value: 532 Ohm
Lead Channel Impedance Value: 570 Ohm
Lead Channel Impedance Value: 570 Ohm
Lead Channel Impedance Value: 722 Ohm
Lead Channel Impedance Value: 760 Ohm
Lead Channel Impedance Value: 893 Ohm
Lead Channel Impedance Value: 893 Ohm
Lead Channel Impedance Value: 893 Ohm
Lead Channel Pacing Threshold Amplitude: 0.5 V
Lead Channel Pacing Threshold Amplitude: 0.75 V
Lead Channel Pacing Threshold Amplitude: 1.375 V
Lead Channel Pacing Threshold Pulse Width: 0.4 ms
Lead Channel Pacing Threshold Pulse Width: 0.4 ms
Lead Channel Pacing Threshold Pulse Width: 0.4 ms
Lead Channel Sensing Intrinsic Amplitude: 23.5 mV
Lead Channel Sensing Intrinsic Amplitude: 23.5 mV
Lead Channel Sensing Intrinsic Amplitude: 4.75 mV
Lead Channel Sensing Intrinsic Amplitude: 4.75 mV
Lead Channel Setting Pacing Amplitude: 1.5 V
Lead Channel Setting Pacing Amplitude: 2 V
Lead Channel Setting Pacing Amplitude: 2 V
Lead Channel Setting Pacing Pulse Width: 0.4 ms
Lead Channel Setting Pacing Pulse Width: 0.4 ms
Lead Channel Setting Sensing Sensitivity: 0.45 mV
Zone Setting Status: 755011
Zone Setting Status: 755011

## 2023-05-24 LAB — URINALYSIS, ROUTINE W REFLEX MICROSCOPIC
Bilirubin Urine: NEGATIVE
Glucose, UA: NEGATIVE mg/dL
Ketones, ur: NEGATIVE mg/dL
Nitrite: POSITIVE — AB
Protein, ur: 30 mg/dL — AB
Specific Gravity, Urine: 1.003 — ABNORMAL LOW (ref 1.005–1.030)
Squamous Epithelial / HPF: 0 /HPF (ref 0–5)
WBC, UA: >50 WBC/hpf (ref 0–5)
pH: 7 (ref 5.0–8.0)

## 2023-05-24 NOTE — ED Triage Notes (Signed)
 Pt to ED via EMS from home c/o urinary retention. Pt reports that catheter hasn't drained in about 4-5 hrs, last time she emptied out bag was about 2-3pm. On arrival, pts leg bag full and was emptied out. Pt has hx of bladder cancer

## 2023-05-24 NOTE — Discharge Instructions (Signed)
 Your Foley catheter appears to be working normally at this time.  Use the sterile water to flush the catheter as demonstrated if outflow seems to be obstructed.  Follow-up with urology or return to the ED if needed.

## 2023-05-24 NOTE — ED Provider Notes (Signed)
 [facility] Emergency Department Provider Note     Event Date/Time   First MD Initiated Contact with Patient 05/24/23 2025     (approximate)   History   Urinary Retention   HPI  [name] is a 83 y.o. female presents to the ED with a urinary cath secondary to a transurethral bladder tumor resection.  Worse the catheter has been clogged and not draining into the bag for the last 4 to 5 hours.  She is endorsing some pelvic fullness and pressure.  Upon arrival however, patient movement, what ever was blocking the catheter outlet, seems to have passed, and the bladder is completely empty.  Patient would endorse intermittent episodes since the cath was placed.  She has in fact noted some thick tissue like sediment in the bag.  This is her second ED visit for such complaints in about a week's time.  The catheter was recently replaced by urology on February 8.  She scheduled to see urology for follow-up in 1 week.  Physical Exam   Triage Vital Signs: ED Triage Vitals [05/24/23 1954]  Encounter Vitals Group     BP (!) 162/109     Systolic BP Percentile      Diastolic BP Percentile      Pulse Rate 81     Resp 18     Temp 98.1 F (36.7 C)     Temp src      SpO2 94 %     Weight      Height      Head Circumference      Peak Flow      Pain Score 4     Pain Loc      Pain Education      Exclude from Growth Chart     Most recent vital signs: Vitals:   05/24/23 1954 05/24/23 2030  BP: (!) 162/109 (!) 142/86  Pulse: 81 81  Resp: 18 17  Temp: 98.1 F (36.7 C)   SpO2: 94% 94%    General Awake, no distress. NAD HEENT NCAT. PERRL. EOMI. No rhinorrhea. Mucous membranes are moist.  CV:  Good peripheral perfusion. RRR RESP:  Normal effort. CTA ABD:  No distention.  GU:  20 French Foley catheter noted in the urethra.  Bag is draining as expected.  Bladder is nondistended at this time.   ED Results / Procedures / Treatments   Labs (all labs ordered  are listed, but only abnormal results are displayed) Labs Reviewed  URINALYSIS, ROUTINE W REFLEX MICROSCOPIC - Abnormal; Notable for the following components:      Result Value   Color, Urine STRAW (*)    APPearance HAZY (*)    Specific Gravity, Urine 1.003 (*)    Hgb urine dipstick MODERATE (*)    Protein, ur 30 (*)    Nitrite POSITIVE (*)    Leukocytes,Ua LARGE (*)    Bacteria, UA MANY (*)    All other components within normal limits    EKG   RADIOLOGY  No results found.   PROCEDURES:  Critical Care performed: No  Procedures   MEDICATIONS ORDERED IN ED: Medications - No data to display   IMPRESSION / MDM / ASSESSMENT AND PLAN / ED COURSE  I reviewed the triage vital signs and the nursing notes.  Differential diagnosis includes, but is not limited to, catheter malfunction, catheter dislodgment, blocked catheter, replace catheter, catheter associated UTI  Patient's presentation is most consistent with acute, uncomplicated illness.  Patient's diagnosis is consistent with acute urinary retention secondary to transurethral resection of bladder tumor.  Patient had the catheter most recently replaced on Monday, 2 days ago by [facility] urology in [place].  She presents today, after she experienced decreased output into the urinary leg bag.  She would also report some tissue causing some blockage in the bag.  At the time of evaluation, the bladder was emptying as expected into the leg bag with clear yellow urine noted.  Patient's Foley is flushed bedside and instructions and supplies were provided to her and her adult daughter.  Patient will be discharged home with instructions to flush the Foley catheter if needed if outflow appears obstructed.. [facility] is to [facility] on Monday as scheduled, as needed or otherwise directed. Patient is given ED precautions to return to the ED for any worsening or new symptoms.   FINAL  CLINICAL IMPRESSION(S) / ED DIAGNOSES   Final diagnoses:  Obstruction of Foley catheter, initial encounter [facility])     Rx / DC Orders   ED Discharge Orders     None        Note:  This document was prepared using Dragon voice recognition software and may include unintentional dictation errors.    Lissa Hoard, PA-C 05/27/23 1956    Merwyn Katos, MD 05/30/23 [phone]

## 2023-05-25 ENCOUNTER — Telehealth: Payer: Self-pay | Admitting: Emergency Medicine

## 2023-05-25 MED ORDER — CEPHALEXIN 500 MG PO CAPS: 500.0000 mg | ORAL_CAPSULE | Freq: Two times a day (BID) | ORAL | 0 refills | Status: AC

## 2023-05-25 NOTE — Telephone Encounter (Signed)
 Pts urine results from 05/24/2023 consistent with UTI. Chart sent to Dr. Marisa Severin to review. Dr. Marisa Severin advised pt needs antibiotics and he will send in to [facility] on [address]  RN called and spoke with pts daughter, Toniann Fail. Toniann Fail was informed of pts urine results and that medication was being sent to the pharmacy. Toniann Fail advised that pt is also on preventative antibiotics while she has the catheter in that she was prescribed by her urologist. Toniann Fail was advised to call their office to see if patient needed to continue the preventative antibiotics or if she could d/c them while on the Keflex. Toniann Fail verbalized understanding and thanked [place] for following up on this.

## 2023-05-25 NOTE — Telephone Encounter (Signed)
 I was alerted that the patient was seen yesterday and discharged; urinalysis is concerning for UTI but the patient did not receive any prescriptions.  I reviewed the urinalysis from yesterday and does show many bacteria, greater than 50 WBCs, and is nitrite positive.  Therefore I will send in a prescription for Keflex.

## 2023-05-26 ENCOUNTER — Encounter

## 2023-05-29 DIAGNOSIS — C674 Malignant neoplasm of posterior wall of bladder: Secondary | ICD-10-CM | POA: Diagnosis not present

## 2023-05-30 ENCOUNTER — Encounter: Payer: Self-pay | Admitting: Oncology

## 2023-05-30 ENCOUNTER — Telehealth: Payer: Self-pay

## 2023-05-30 NOTE — Telephone Encounter (Signed)
 Copied from CRM [id number]. Topic: General - Other >> May 30, 2023 10:35 AM Fredrich Romans wrote: Reason for CRM: Patients daughter called in stating that they would like to have palliative care set up for her mom through [facility]. She said that she had them before and it stopped and she's needing it again now. Her nurse that she was seeing before was latoya rivers. Daughter Tresa Endo :[phone]

## 2023-05-30 NOTE — Telephone Encounter (Signed)
 Copied from CRM [id number]. Topic: Appointments - Scheduling Inquiry for Clinic >> May 30, 2023  4:28 PM Isabell A wrote: Reason for CRM: Daughter Tresa Endo calling to request if patients' appointment can be changed to a virtual visit instead.   Callback number: [phone] Sister phone number incase she doesn't answer - Hilma Favors [phone]

## 2023-05-30 NOTE — Telephone Encounter (Signed)
 Spoke with patients daughter and she states there is a good possibility that the patinet will be moving to [facility] due to getting cancer treatments.  The daughter would like to know how the process would go to get her transferred to another clinic.  Informed daughter tht we would send a referral once they found a place in [facility] that they would like to go to.  Daughter asks if they will prescribe her the same medication that she is getting now.  Informed daughter that this would be up to the provider who takes her as a patient and that we could not guarantee anything.  I did tell the daughter she could stay on as a patient until she found out the answers she was looking for and if worse came to worse, the patient could come back every three months for a face to face visit and continue being under our care.  Spoke to Dr Cherylann Ratel and he agrees with the above.  Daughter was going to look into trying to find out a pain clinic in [facility] and let us know.

## 2023-05-30 NOTE — Telephone Encounter (Signed)
 Spoke with pt's daughter after getting a verbal okay from pt to talk with her. I let Tresa Endo know that pt will need to schedule a face to face appt in order to have palliative or hospice referrals placed. Daughter stated that she will contact her siblings and see who would be able to bring pt in for the appt before scheduling anything. Daughter will call back to schedule.

## 2023-05-31 ENCOUNTER — Telehealth: Payer: Self-pay | Admitting: Oncology

## 2023-05-31 NOTE — Telephone Encounter (Signed)
 Spoke with patient daughter [name], not listed on DPR, but ok's with Hilarie Fredrickson. 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 Please call Darl Pikes to schedule appointment as Tresa Endo will be on an airplane most of the day.

## 2023-05-31 NOTE — Telephone Encounter (Signed)
 FYI

## 2023-05-31 NOTE — Telephone Encounter (Signed)
 Spoke with pt's daughter Tresa Endo to let her know that the appt can be a virtual visit. Appt has been changed in Epic.

## 2023-05-31 NOTE — Telephone Encounter (Unsigned)
 Copied from CRM [id number]. Topic: General - Other >> May 31, 2023  4:23 PM Sonny Dandy B wrote: Reason for CRM: Nurse from [facility] called to inform the provider that pt will be receiving Palliative care from [facility],

## 2023-06-02 ENCOUNTER — Telehealth: Payer: Self-pay

## 2023-06-02 ENCOUNTER — Telehealth: Payer: Self-pay | Admitting: *Deleted

## 2023-06-02 NOTE — Telephone Encounter (Signed)
 Her daughter states that she has had a rapid decline and she feels the patient needs to go to hospice and they want to know would we do this her first appt. 3/19. You did a telephone visit before. The daughter is Investment banker, operational. I called her number and she did not answer and left message that I am sending the team of finnegan and they will see what they can do

## 2023-06-02 NOTE — Telephone Encounter (Signed)
 I called daughter back and let her know that the patient will be sen in her house with [facility] at 11 am tom. And Dr. Orlie Dakin did talk to the staff there and she is less than 6 months to live and pt is not taking chemo

## 2023-06-02 NOTE — Telephone Encounter (Signed)
 Spoke with Dr. Darrick Huntsman who provided verbal orders to be hospice attending, agrees with life expectancy of 6 months or less if terminal illness runs its course.  Called both [facility] and Toniann Fail, daughter.

## 2023-06-02 NOTE — Telephone Encounter (Signed)
 Called and spoke with Toniann Fail, daughter to see if the family was in agreement with the transition to hospice.  Toniann Fail reported that they are and that this her mother's wishes.  Pt has had a "rapid decline" since procedure in February.  She has increased weakness with 3 falls in the past 24 hrs.  Toniann Fail describes her mothers situation as "extreme suffering" due to the pain from her bladder cancer.  They are looking to hospice for symptom management and to transfer her to the hospice facility for 24/7 care.    Dr. Darrick Huntsman please advise if you agree with hospice care and if you'd like to be attending so that we can call [facility].  They plan to have patient evaluated tomorrow.     Copied from CRM [id number]. Topic: Clinical - Medical Advice >> Jun 02, 2023 11:28 AM Orinda Kenner C wrote: Reason for CRM: Star from [facility] [phone] called to informed pcp that patient will go to Palliative care. Now the patient's family wants to put patient in hospice care. Star is asking if Dr. Darrick Huntsman will be the patient's hospice physician? If Dr. Darrick Huntsman think patient's life expectancy is less than 6 months, and if the terminal illness runs it course? Nurse will be visiting patient tomorrow. Please advise and call back.

## 2023-06-03 DIAGNOSIS — G894 Chronic pain syndrome: Secondary | ICD-10-CM | POA: Diagnosis not present

## 2023-06-03 DIAGNOSIS — C679 Malignant neoplasm of bladder, unspecified: Secondary | ICD-10-CM | POA: Diagnosis not present

## 2023-06-03 DIAGNOSIS — K219 Gastro-esophageal reflux disease without esophagitis: Secondary | ICD-10-CM | POA: Diagnosis not present

## 2023-06-03 DIAGNOSIS — M4800 Spinal stenosis, site unspecified: Secondary | ICD-10-CM | POA: Diagnosis not present

## 2023-06-03 DIAGNOSIS — Z95810 Presence of automatic (implantable) cardiac defibrillator: Secondary | ICD-10-CM | POA: Diagnosis not present

## 2023-06-03 DIAGNOSIS — J449 Chronic obstructive pulmonary disease, unspecified: Secondary | ICD-10-CM | POA: Diagnosis not present

## 2023-06-03 DIAGNOSIS — I10 Essential (primary) hypertension: Secondary | ICD-10-CM | POA: Diagnosis not present

## 2023-06-04 DIAGNOSIS — J449 Chronic obstructive pulmonary disease, unspecified: Secondary | ICD-10-CM | POA: Diagnosis not present

## 2023-06-04 DIAGNOSIS — I10 Essential (primary) hypertension: Secondary | ICD-10-CM | POA: Diagnosis not present

## 2023-06-04 DIAGNOSIS — K219 Gastro-esophageal reflux disease without esophagitis: Secondary | ICD-10-CM | POA: Diagnosis not present

## 2023-06-04 DIAGNOSIS — C679 Malignant neoplasm of bladder, unspecified: Secondary | ICD-10-CM | POA: Diagnosis not present

## 2023-06-04 DIAGNOSIS — G894 Chronic pain syndrome: Secondary | ICD-10-CM | POA: Diagnosis not present

## 2023-06-04 DIAGNOSIS — M4800 Spinal stenosis, site unspecified: Secondary | ICD-10-CM | POA: Diagnosis not present

## 2023-06-05 ENCOUNTER — Telehealth: Payer: Self-pay | Admitting: *Deleted

## 2023-06-05 ENCOUNTER — Telehealth: Payer: Self-pay | Admitting: Oncology

## 2023-06-05 ENCOUNTER — Telehealth: Payer: Self-pay | Admitting: Student in an Organized Health Care Education/Training Program

## 2023-06-05 ENCOUNTER — Telehealth: Payer: Self-pay | Admitting: Internal Medicine

## 2023-06-05 DIAGNOSIS — J449 Chronic obstructive pulmonary disease, unspecified: Secondary | ICD-10-CM | POA: Diagnosis not present

## 2023-06-05 DIAGNOSIS — I10 Essential (primary) hypertension: Secondary | ICD-10-CM | POA: Diagnosis not present

## 2023-06-05 DIAGNOSIS — M4800 Spinal stenosis, site unspecified: Secondary | ICD-10-CM | POA: Diagnosis not present

## 2023-06-05 DIAGNOSIS — G894 Chronic pain syndrome: Secondary | ICD-10-CM | POA: Diagnosis not present

## 2023-06-05 DIAGNOSIS — C679 Malignant neoplasm of bladder, unspecified: Secondary | ICD-10-CM | POA: Diagnosis not present

## 2023-06-05 DIAGNOSIS — K219 Gastro-esophageal reflux disease without esophagitis: Secondary | ICD-10-CM | POA: Diagnosis not present

## 2023-06-05 NOTE — Telephone Encounter (Signed)
 Patient daughter called to in form Lateef that patient is now under the care of hospice. Is cancel patient appt for 06-20-23. FYI. If you have any more questions please give Tresa Endo a call at [phone] [facility]

## 2023-06-05 NOTE — Telephone Encounter (Signed)
 Copied from CRM [id number]. Topic: Compliment - Staff >> Jun 05, 2023 11:23 AM Mackie Pai E wrote: Patient's aide, Annice Pih, called in wanting to thank Dr. Darrick Huntsman and her team for taking the patient on for hospice care. Annice Pih is very grateful for the work that has gone into this and wanted to send a virtual thank you.

## 2023-06-05 NOTE — Telephone Encounter (Signed)
 According to medicare they need a paper to fill out before 5 days of going in 5 days of being in hospice care. I called hospice and the got switched to hospice rooms and then the person that answered the phone and she did not know about that , then she sent me to social worker Ms. Jean Rosenthal and I left a message for her to call me back.also Suzan know this now as well I asked her to insurance and see if they have this form, she will try also

## 2023-06-05 NOTE — Telephone Encounter (Signed)
 Patient called to cancel appointment due to mother opting for Hospice

## 2023-06-06 DIAGNOSIS — C679 Malignant neoplasm of bladder, unspecified: Secondary | ICD-10-CM | POA: Diagnosis not present

## 2023-06-06 DIAGNOSIS — K219 Gastro-esophageal reflux disease without esophagitis: Secondary | ICD-10-CM | POA: Diagnosis not present

## 2023-06-06 DIAGNOSIS — J449 Chronic obstructive pulmonary disease, unspecified: Secondary | ICD-10-CM | POA: Diagnosis not present

## 2023-06-06 DIAGNOSIS — G894 Chronic pain syndrome: Secondary | ICD-10-CM | POA: Diagnosis not present

## 2023-06-06 DIAGNOSIS — M4800 Spinal stenosis, site unspecified: Secondary | ICD-10-CM | POA: Diagnosis not present

## 2023-06-06 DIAGNOSIS — I10 Essential (primary) hypertension: Secondary | ICD-10-CM | POA: Diagnosis not present

## 2023-06-07 ENCOUNTER — Inpatient Hospital Stay: Admitting: Oncology

## 2023-06-07 DIAGNOSIS — M4800 Spinal stenosis, site unspecified: Secondary | ICD-10-CM | POA: Diagnosis not present

## 2023-06-07 DIAGNOSIS — I10 Essential (primary) hypertension: Secondary | ICD-10-CM | POA: Diagnosis not present

## 2023-06-07 DIAGNOSIS — C679 Malignant neoplasm of bladder, unspecified: Secondary | ICD-10-CM | POA: Diagnosis not present

## 2023-06-07 DIAGNOSIS — J449 Chronic obstructive pulmonary disease, unspecified: Secondary | ICD-10-CM | POA: Diagnosis not present

## 2023-06-07 DIAGNOSIS — K219 Gastro-esophageal reflux disease without esophagitis: Secondary | ICD-10-CM | POA: Diagnosis not present

## 2023-06-07 DIAGNOSIS — G894 Chronic pain syndrome: Secondary | ICD-10-CM | POA: Diagnosis not present

## 2023-06-08 ENCOUNTER — Telehealth: Admitting: Internal Medicine

## 2023-06-08 DIAGNOSIS — J449 Chronic obstructive pulmonary disease, unspecified: Secondary | ICD-10-CM | POA: Diagnosis not present

## 2023-06-08 DIAGNOSIS — C679 Malignant neoplasm of bladder, unspecified: Secondary | ICD-10-CM | POA: Diagnosis not present

## 2023-06-08 DIAGNOSIS — K219 Gastro-esophageal reflux disease without esophagitis: Secondary | ICD-10-CM | POA: Diagnosis not present

## 2023-06-08 DIAGNOSIS — I10 Essential (primary) hypertension: Secondary | ICD-10-CM | POA: Diagnosis not present

## 2023-06-08 DIAGNOSIS — M4800 Spinal stenosis, site unspecified: Secondary | ICD-10-CM | POA: Diagnosis not present

## 2023-06-08 DIAGNOSIS — G894 Chronic pain syndrome: Secondary | ICD-10-CM | POA: Diagnosis not present

## 2023-06-09 ENCOUNTER — Ambulatory Visit: Admitting: Oncology

## 2023-06-09 ENCOUNTER — Telehealth: Payer: Self-pay

## 2023-06-09 DIAGNOSIS — M4800 Spinal stenosis, site unspecified: Secondary | ICD-10-CM | POA: Diagnosis not present

## 2023-06-09 DIAGNOSIS — I10 Essential (primary) hypertension: Secondary | ICD-10-CM | POA: Diagnosis not present

## 2023-06-09 DIAGNOSIS — J449 Chronic obstructive pulmonary disease, unspecified: Secondary | ICD-10-CM | POA: Diagnosis not present

## 2023-06-09 DIAGNOSIS — C679 Malignant neoplasm of bladder, unspecified: Secondary | ICD-10-CM | POA: Diagnosis not present

## 2023-06-09 DIAGNOSIS — G894 Chronic pain syndrome: Secondary | ICD-10-CM | POA: Diagnosis not present

## 2023-06-09 DIAGNOSIS — K219 Gastro-esophageal reflux disease without esophagitis: Secondary | ICD-10-CM | POA: Diagnosis not present

## 2023-06-09 NOTE — Telephone Encounter (Signed)
 The pt daughter Hilarie Fredrickson called wanting [place] to get in touch with [employer] to deactivate her. I asked her the hospice number.  I spoke with the hospice nurse Clyde Canterbury and she states it is not neccessary at this time to deactivate her device.  I left a detailed message on Toniann Fail voicemail letting her know.

## 2023-06-10 DIAGNOSIS — J449 Chronic obstructive pulmonary disease, unspecified: Secondary | ICD-10-CM | POA: Diagnosis not present

## 2023-06-10 DIAGNOSIS — I10 Essential (primary) hypertension: Secondary | ICD-10-CM | POA: Diagnosis not present

## 2023-06-10 DIAGNOSIS — G894 Chronic pain syndrome: Secondary | ICD-10-CM | POA: Diagnosis not present

## 2023-06-10 DIAGNOSIS — C679 Malignant neoplasm of bladder, unspecified: Secondary | ICD-10-CM | POA: Diagnosis not present

## 2023-06-10 DIAGNOSIS — M4800 Spinal stenosis, site unspecified: Secondary | ICD-10-CM | POA: Diagnosis not present

## 2023-06-10 DIAGNOSIS — K219 Gastro-esophageal reflux disease without esophagitis: Secondary | ICD-10-CM | POA: Diagnosis not present

## 2023-06-11 DIAGNOSIS — J449 Chronic obstructive pulmonary disease, unspecified: Secondary | ICD-10-CM | POA: Diagnosis not present

## 2023-06-11 DIAGNOSIS — C679 Malignant neoplasm of bladder, unspecified: Secondary | ICD-10-CM | POA: Diagnosis not present

## 2023-06-11 DIAGNOSIS — M4800 Spinal stenosis, site unspecified: Secondary | ICD-10-CM | POA: Diagnosis not present

## 2023-06-11 DIAGNOSIS — I10 Essential (primary) hypertension: Secondary | ICD-10-CM | POA: Diagnosis not present

## 2023-06-11 DIAGNOSIS — G894 Chronic pain syndrome: Secondary | ICD-10-CM | POA: Diagnosis not present

## 2023-06-11 DIAGNOSIS — K219 Gastro-esophageal reflux disease without esophagitis: Secondary | ICD-10-CM | POA: Diagnosis not present

## 2023-06-12 DIAGNOSIS — C679 Malignant neoplasm of bladder, unspecified: Secondary | ICD-10-CM | POA: Diagnosis not present

## 2023-06-12 DIAGNOSIS — G894 Chronic pain syndrome: Secondary | ICD-10-CM | POA: Diagnosis not present

## 2023-06-12 DIAGNOSIS — K219 Gastro-esophageal reflux disease without esophagitis: Secondary | ICD-10-CM | POA: Diagnosis not present

## 2023-06-12 DIAGNOSIS — M4800 Spinal stenosis, site unspecified: Secondary | ICD-10-CM | POA: Diagnosis not present

## 2023-06-12 DIAGNOSIS — J449 Chronic obstructive pulmonary disease, unspecified: Secondary | ICD-10-CM | POA: Diagnosis not present

## 2023-06-12 DIAGNOSIS — I10 Essential (primary) hypertension: Secondary | ICD-10-CM | POA: Diagnosis not present

## 2023-06-13 DIAGNOSIS — M4800 Spinal stenosis, site unspecified: Secondary | ICD-10-CM | POA: Diagnosis not present

## 2023-06-13 DIAGNOSIS — G894 Chronic pain syndrome: Secondary | ICD-10-CM | POA: Diagnosis not present

## 2023-06-13 DIAGNOSIS — K219 Gastro-esophageal reflux disease without esophagitis: Secondary | ICD-10-CM | POA: Diagnosis not present

## 2023-06-13 DIAGNOSIS — C679 Malignant neoplasm of bladder, unspecified: Secondary | ICD-10-CM | POA: Diagnosis not present

## 2023-06-13 DIAGNOSIS — I10 Essential (primary) hypertension: Secondary | ICD-10-CM | POA: Diagnosis not present

## 2023-06-13 DIAGNOSIS — J449 Chronic obstructive pulmonary disease, unspecified: Secondary | ICD-10-CM | POA: Diagnosis not present

## 2023-06-14 DIAGNOSIS — K219 Gastro-esophageal reflux disease without esophagitis: Secondary | ICD-10-CM | POA: Diagnosis not present

## 2023-06-14 DIAGNOSIS — J449 Chronic obstructive pulmonary disease, unspecified: Secondary | ICD-10-CM | POA: Diagnosis not present

## 2023-06-14 DIAGNOSIS — G894 Chronic pain syndrome: Secondary | ICD-10-CM | POA: Diagnosis not present

## 2023-06-14 DIAGNOSIS — M4800 Spinal stenosis, site unspecified: Secondary | ICD-10-CM | POA: Diagnosis not present

## 2023-06-14 DIAGNOSIS — I10 Essential (primary) hypertension: Secondary | ICD-10-CM | POA: Diagnosis not present

## 2023-06-14 DIAGNOSIS — C679 Malignant neoplasm of bladder, unspecified: Secondary | ICD-10-CM | POA: Diagnosis not present

## 2023-06-15 DIAGNOSIS — G894 Chronic pain syndrome: Secondary | ICD-10-CM | POA: Diagnosis not present

## 2023-06-15 DIAGNOSIS — C679 Malignant neoplasm of bladder, unspecified: Secondary | ICD-10-CM | POA: Diagnosis not present

## 2023-06-15 DIAGNOSIS — M4800 Spinal stenosis, site unspecified: Secondary | ICD-10-CM | POA: Diagnosis not present

## 2023-06-15 DIAGNOSIS — I10 Essential (primary) hypertension: Secondary | ICD-10-CM | POA: Diagnosis not present

## 2023-06-15 DIAGNOSIS — J449 Chronic obstructive pulmonary disease, unspecified: Secondary | ICD-10-CM | POA: Diagnosis not present

## 2023-06-15 DIAGNOSIS — K219 Gastro-esophageal reflux disease without esophagitis: Secondary | ICD-10-CM | POA: Diagnosis not present

## 2023-06-16 DIAGNOSIS — K219 Gastro-esophageal reflux disease without esophagitis: Secondary | ICD-10-CM | POA: Diagnosis not present

## 2023-06-16 DIAGNOSIS — I10 Essential (primary) hypertension: Secondary | ICD-10-CM | POA: Diagnosis not present

## 2023-06-16 DIAGNOSIS — C679 Malignant neoplasm of bladder, unspecified: Secondary | ICD-10-CM | POA: Diagnosis not present

## 2023-06-16 DIAGNOSIS — J449 Chronic obstructive pulmonary disease, unspecified: Secondary | ICD-10-CM | POA: Diagnosis not present

## 2023-06-16 DIAGNOSIS — G894 Chronic pain syndrome: Secondary | ICD-10-CM | POA: Diagnosis not present

## 2023-06-16 DIAGNOSIS — M4800 Spinal stenosis, site unspecified: Secondary | ICD-10-CM | POA: Diagnosis not present

## 2023-06-17 DIAGNOSIS — K219 Gastro-esophageal reflux disease without esophagitis: Secondary | ICD-10-CM | POA: Diagnosis not present

## 2023-06-17 DIAGNOSIS — I10 Essential (primary) hypertension: Secondary | ICD-10-CM | POA: Diagnosis not present

## 2023-06-17 DIAGNOSIS — G894 Chronic pain syndrome: Secondary | ICD-10-CM | POA: Diagnosis not present

## 2023-06-17 DIAGNOSIS — M4800 Spinal stenosis, site unspecified: Secondary | ICD-10-CM | POA: Diagnosis not present

## 2023-06-17 DIAGNOSIS — J449 Chronic obstructive pulmonary disease, unspecified: Secondary | ICD-10-CM | POA: Diagnosis not present

## 2023-06-17 DIAGNOSIS — C679 Malignant neoplasm of bladder, unspecified: Secondary | ICD-10-CM | POA: Diagnosis not present

## 2023-06-18 DIAGNOSIS — M4800 Spinal stenosis, site unspecified: Secondary | ICD-10-CM | POA: Diagnosis not present

## 2023-06-18 DIAGNOSIS — C679 Malignant neoplasm of bladder, unspecified: Secondary | ICD-10-CM | POA: Diagnosis not present

## 2023-06-18 DIAGNOSIS — K219 Gastro-esophageal reflux disease without esophagitis: Secondary | ICD-10-CM | POA: Diagnosis not present

## 2023-06-18 DIAGNOSIS — J449 Chronic obstructive pulmonary disease, unspecified: Secondary | ICD-10-CM | POA: Diagnosis not present

## 2023-06-18 DIAGNOSIS — I10 Essential (primary) hypertension: Secondary | ICD-10-CM | POA: Diagnosis not present

## 2023-06-18 DIAGNOSIS — G894 Chronic pain syndrome: Secondary | ICD-10-CM | POA: Diagnosis not present

## 2023-06-19 DIAGNOSIS — K219 Gastro-esophageal reflux disease without esophagitis: Secondary | ICD-10-CM | POA: Diagnosis not present

## 2023-06-19 DIAGNOSIS — I10 Essential (primary) hypertension: Secondary | ICD-10-CM | POA: Diagnosis not present

## 2023-06-19 DIAGNOSIS — C679 Malignant neoplasm of bladder, unspecified: Secondary | ICD-10-CM | POA: Diagnosis not present

## 2023-06-19 DIAGNOSIS — J449 Chronic obstructive pulmonary disease, unspecified: Secondary | ICD-10-CM | POA: Diagnosis not present

## 2023-06-19 DIAGNOSIS — G894 Chronic pain syndrome: Secondary | ICD-10-CM | POA: Diagnosis not present

## 2023-06-19 DIAGNOSIS — M4800 Spinal stenosis, site unspecified: Secondary | ICD-10-CM | POA: Diagnosis not present

## 2023-06-20 ENCOUNTER — Encounter: Admitting: Student in an Organized Health Care Education/Training Program

## 2023-06-20 ENCOUNTER — Telehealth: Payer: Self-pay | Admitting: Internal Medicine

## 2023-06-20 DIAGNOSIS — I10 Essential (primary) hypertension: Secondary | ICD-10-CM | POA: Diagnosis not present

## 2023-06-20 DIAGNOSIS — G894 Chronic pain syndrome: Secondary | ICD-10-CM | POA: Diagnosis not present

## 2023-06-20 DIAGNOSIS — K219 Gastro-esophageal reflux disease without esophagitis: Secondary | ICD-10-CM | POA: Diagnosis not present

## 2023-06-20 DIAGNOSIS — Z95810 Presence of automatic (implantable) cardiac defibrillator: Secondary | ICD-10-CM | POA: Diagnosis not present

## 2023-06-20 DIAGNOSIS — C679 Malignant neoplasm of bladder, unspecified: Secondary | ICD-10-CM | POA: Diagnosis not present

## 2023-06-20 DIAGNOSIS — M4800 Spinal stenosis, site unspecified: Secondary | ICD-10-CM | POA: Diagnosis not present

## 2023-06-20 DIAGNOSIS — J449 Chronic obstructive pulmonary disease, unspecified: Secondary | ICD-10-CM | POA: Diagnosis not present

## 2023-06-20 NOTE — Telephone Encounter (Signed)
  1. Has your device fired? No   2. Is you device beeping? No   3. Are you experiencing draining or swelling at device site? No   4. Are you calling to see if we received your device transmission? No   5. Have you passed out? No  Per daughter pt has been put on home hospice. Pt will be living with her daughter. Do they need to take transmitter box with her. Daughter said if she doesn't answer you can leave detailed vm   Please route to Device Clinic Pool

## 2023-06-21 DIAGNOSIS — I10 Essential (primary) hypertension: Secondary | ICD-10-CM | POA: Diagnosis not present

## 2023-06-21 DIAGNOSIS — M4800 Spinal stenosis, site unspecified: Secondary | ICD-10-CM | POA: Diagnosis not present

## 2023-06-21 DIAGNOSIS — J449 Chronic obstructive pulmonary disease, unspecified: Secondary | ICD-10-CM | POA: Diagnosis not present

## 2023-06-21 DIAGNOSIS — C679 Malignant neoplasm of bladder, unspecified: Secondary | ICD-10-CM | POA: Diagnosis not present

## 2023-06-21 DIAGNOSIS — K219 Gastro-esophageal reflux disease without esophagitis: Secondary | ICD-10-CM | POA: Diagnosis not present

## 2023-06-21 DIAGNOSIS — G894 Chronic pain syndrome: Secondary | ICD-10-CM | POA: Diagnosis not present

## 2023-06-22 DIAGNOSIS — G894 Chronic pain syndrome: Secondary | ICD-10-CM | POA: Diagnosis not present

## 2023-06-22 DIAGNOSIS — J449 Chronic obstructive pulmonary disease, unspecified: Secondary | ICD-10-CM | POA: Diagnosis not present

## 2023-06-22 DIAGNOSIS — C679 Malignant neoplasm of bladder, unspecified: Secondary | ICD-10-CM | POA: Diagnosis not present

## 2023-06-22 DIAGNOSIS — K219 Gastro-esophageal reflux disease without esophagitis: Secondary | ICD-10-CM | POA: Diagnosis not present

## 2023-06-22 DIAGNOSIS — I10 Essential (primary) hypertension: Secondary | ICD-10-CM | POA: Diagnosis not present

## 2023-06-22 DIAGNOSIS — M4800 Spinal stenosis, site unspecified: Secondary | ICD-10-CM | POA: Diagnosis not present

## 2023-06-22 NOTE — Progress Notes (Signed)
 Remote ICD transmission.

## 2023-06-22 NOTE — Addendum Note (Signed)
 Addended by: Geralyn Flash D on: 06/22/2023 03:42 PM   Modules accepted: Orders

## 2023-06-23 NOTE — Telephone Encounter (Signed)
 Follow Up:      Daughter is calling back to find out, what does need to do about the monitor box. She said the Defibrillator have been turned off, but the Pacemaker is still on.

## 2023-06-23 NOTE — Telephone Encounter (Signed)
 I left a detail message on [name]'s voicemail letting her know the pt monitor needs to be wherever she is sleeping at.

## 2023-06-24 DIAGNOSIS — I10 Essential (primary) hypertension: Secondary | ICD-10-CM | POA: Diagnosis not present

## 2023-06-24 DIAGNOSIS — K219 Gastro-esophageal reflux disease without esophagitis: Secondary | ICD-10-CM | POA: Diagnosis not present

## 2023-06-24 DIAGNOSIS — C679 Malignant neoplasm of bladder, unspecified: Secondary | ICD-10-CM | POA: Diagnosis not present

## 2023-06-24 DIAGNOSIS — G894 Chronic pain syndrome: Secondary | ICD-10-CM | POA: Diagnosis not present

## 2023-06-24 DIAGNOSIS — J449 Chronic obstructive pulmonary disease, unspecified: Secondary | ICD-10-CM | POA: Diagnosis not present

## 2023-06-24 DIAGNOSIS — M4800 Spinal stenosis, site unspecified: Secondary | ICD-10-CM | POA: Diagnosis not present

## 2023-06-25 DIAGNOSIS — G894 Chronic pain syndrome: Secondary | ICD-10-CM | POA: Diagnosis not present

## 2023-06-25 DIAGNOSIS — K219 Gastro-esophageal reflux disease without esophagitis: Secondary | ICD-10-CM | POA: Diagnosis not present

## 2023-06-25 DIAGNOSIS — C679 Malignant neoplasm of bladder, unspecified: Secondary | ICD-10-CM | POA: Diagnosis not present

## 2023-06-25 DIAGNOSIS — M4800 Spinal stenosis, site unspecified: Secondary | ICD-10-CM | POA: Diagnosis not present

## 2023-06-25 DIAGNOSIS — J449 Chronic obstructive pulmonary disease, unspecified: Secondary | ICD-10-CM | POA: Diagnosis not present

## 2023-06-25 DIAGNOSIS — I10 Essential (primary) hypertension: Secondary | ICD-10-CM | POA: Diagnosis not present

## 2023-06-26 DIAGNOSIS — G894 Chronic pain syndrome: Secondary | ICD-10-CM | POA: Diagnosis not present

## 2023-06-26 DIAGNOSIS — I10 Essential (primary) hypertension: Secondary | ICD-10-CM | POA: Diagnosis not present

## 2023-06-26 DIAGNOSIS — K219 Gastro-esophageal reflux disease without esophagitis: Secondary | ICD-10-CM | POA: Diagnosis not present

## 2023-06-26 DIAGNOSIS — C679 Malignant neoplasm of bladder, unspecified: Secondary | ICD-10-CM | POA: Diagnosis not present

## 2023-06-26 DIAGNOSIS — J449 Chronic obstructive pulmonary disease, unspecified: Secondary | ICD-10-CM | POA: Diagnosis not present

## 2023-06-26 DIAGNOSIS — M4800 Spinal stenosis, site unspecified: Secondary | ICD-10-CM | POA: Diagnosis not present

## 2023-06-27 ENCOUNTER — Telehealth: Payer: Self-pay

## 2023-06-27 DIAGNOSIS — J449 Chronic obstructive pulmonary disease, unspecified: Secondary | ICD-10-CM | POA: Diagnosis not present

## 2023-06-27 DIAGNOSIS — G894 Chronic pain syndrome: Secondary | ICD-10-CM | POA: Diagnosis not present

## 2023-06-27 DIAGNOSIS — C679 Malignant neoplasm of bladder, unspecified: Secondary | ICD-10-CM | POA: Diagnosis not present

## 2023-06-27 DIAGNOSIS — K219 Gastro-esophageal reflux disease without esophagitis: Secondary | ICD-10-CM | POA: Diagnosis not present

## 2023-06-27 DIAGNOSIS — I10 Essential (primary) hypertension: Secondary | ICD-10-CM | POA: Diagnosis not present

## 2023-06-27 DIAGNOSIS — M4800 Spinal stenosis, site unspecified: Secondary | ICD-10-CM | POA: Diagnosis not present

## 2023-06-27 NOTE — Telephone Encounter (Signed)
 Copied from CRM [id number]. Topic: Clinical - Medical Advice >> Jun 27, 2023  4:12 PM Sim Boast F wrote: Reason for CRM: Patient daughter Toniann Fail request a call back at [phone] regarding patient Power Of Attorney questions - wants to know if Dr. Darrick Huntsman would be the "attending physican" to put a power of attorney into effect and if she is would Dr. Darrick Huntsman declare patient to be terminally ill? Patient is currently in hospice via [facility].

## 2023-06-28 DIAGNOSIS — J449 Chronic obstructive pulmonary disease, unspecified: Secondary | ICD-10-CM | POA: Diagnosis not present

## 2023-06-28 DIAGNOSIS — K219 Gastro-esophageal reflux disease without esophagitis: Secondary | ICD-10-CM | POA: Diagnosis not present

## 2023-06-28 DIAGNOSIS — G894 Chronic pain syndrome: Secondary | ICD-10-CM | POA: Diagnosis not present

## 2023-06-28 DIAGNOSIS — I10 Essential (primary) hypertension: Secondary | ICD-10-CM | POA: Diagnosis not present

## 2023-06-28 DIAGNOSIS — C679 Malignant neoplasm of bladder, unspecified: Secondary | ICD-10-CM | POA: Diagnosis not present

## 2023-06-28 DIAGNOSIS — M4800 Spinal stenosis, site unspecified: Secondary | ICD-10-CM | POA: Diagnosis not present

## 2023-06-28 NOTE — Telephone Encounter (Signed)
 noted

## 2023-06-28 NOTE — Telephone Encounter (Signed)
 Spoke with pt's daughter Toniann Fail and she stated that she is needing a lett with this information in it as soon as possible. She stated that the hospice nurse has advised them that pt has transitioned and is actively dying.

## 2023-06-29 DIAGNOSIS — I10 Essential (primary) hypertension: Secondary | ICD-10-CM | POA: Diagnosis not present

## 2023-06-29 DIAGNOSIS — J449 Chronic obstructive pulmonary disease, unspecified: Secondary | ICD-10-CM | POA: Diagnosis not present

## 2023-06-29 DIAGNOSIS — M4800 Spinal stenosis, site unspecified: Secondary | ICD-10-CM | POA: Diagnosis not present

## 2023-06-29 DIAGNOSIS — C679 Malignant neoplasm of bladder, unspecified: Secondary | ICD-10-CM | POA: Diagnosis not present

## 2023-06-29 DIAGNOSIS — G894 Chronic pain syndrome: Secondary | ICD-10-CM | POA: Diagnosis not present

## 2023-06-29 DIAGNOSIS — K219 Gastro-esophageal reflux disease without esophagitis: Secondary | ICD-10-CM | POA: Diagnosis not present

## 2023-06-30 ENCOUNTER — Telehealth: Payer: Self-pay

## 2023-06-30 ENCOUNTER — Telehealth: Payer: Self-pay | Admitting: Internal Medicine

## 2023-06-30 NOTE — Telephone Encounter (Signed)
 Copied from CRM [id number]. Topic: General - Other >> Jun 30, 2023  4:25 PM Truddie Crumble wrote: Reason for CRM: patient daughter called stating the patient passed away on yesterday

## 2023-06-30 NOTE — Telephone Encounter (Signed)
 Pt's daughter Hilarie Fredrickson is requesting a callback at [phone] regarding her mother passing. She'd like to speak with Dr. Graciela Husbands directly to express her appreciation and to let him know how much of an impact he had on her. Please advise.

## 2023-07-03 NOTE — Telephone Encounter (Signed)
 Spoke with daughter

## 2023-07-20 DEATH — deceased

## 2023-08-21 ENCOUNTER — Encounter

## 2023-11-21 ENCOUNTER — Encounter

## 2024-02-20 ENCOUNTER — Encounter
